# Patient Record
Sex: Male | Born: 1935 | Race: White | Hispanic: No | Marital: Married | Smoking: Former smoker
Health system: Southern US, Community
[De-identification: ages and names within clinical notes are randomized; demographics above are authoritative.]

## PROBLEM LIST (undated history)

## (undated) DIAGNOSIS — K219 Gastro-esophageal reflux disease without esophagitis: Secondary | ICD-10-CM

## (undated) DIAGNOSIS — M4712 Other spondylosis with myelopathy, cervical region: Secondary | ICD-10-CM

## (undated) DIAGNOSIS — I6529 Occlusion and stenosis of unspecified carotid artery: Secondary | ICD-10-CM

## (undated) DIAGNOSIS — C61 Malignant neoplasm of prostate: Secondary | ICD-10-CM

## (undated) DIAGNOSIS — J209 Acute bronchitis, unspecified: Secondary | ICD-10-CM

## (undated) DIAGNOSIS — J189 Pneumonia, unspecified organism: Secondary | ICD-10-CM

## (undated) DIAGNOSIS — D649 Anemia, unspecified: Secondary | ICD-10-CM

## (undated) DIAGNOSIS — Z9889 Other specified postprocedural states: Secondary | ICD-10-CM

## (undated) DIAGNOSIS — Z9289 Personal history of other medical treatment: Secondary | ICD-10-CM

## (undated) DIAGNOSIS — S72001A Fracture of unspecified part of neck of right femur, initial encounter for closed fracture: Secondary | ICD-10-CM

## (undated) DIAGNOSIS — G894 Chronic pain syndrome: Secondary | ICD-10-CM

## (undated) DIAGNOSIS — K222 Esophageal obstruction: Secondary | ICD-10-CM

## (undated) DIAGNOSIS — E785 Hyperlipidemia, unspecified: Secondary | ICD-10-CM

## (undated) DIAGNOSIS — I10 Essential (primary) hypertension: Secondary | ICD-10-CM

## (undated) DIAGNOSIS — I639 Cerebral infarction, unspecified: Secondary | ICD-10-CM

## (undated) DIAGNOSIS — K5790 Diverticulosis of intestine, part unspecified, without perforation or abscess without bleeding: Secondary | ICD-10-CM

## (undated) DIAGNOSIS — R1013 Epigastric pain: Secondary | ICD-10-CM

## (undated) HISTORY — DX: Anemia, unspecified: D64.9

## (undated) HISTORY — DX: Other spondylosis with myelopathy, cervical region: M47.12

## (undated) HISTORY — DX: Pneumonia, unspecified organism: J18.9

## (undated) HISTORY — PX: NASAL SINUS SURGERY: SHX719

## (undated) HISTORY — DX: Gastro-esophageal reflux disease without esophagitis: K21.9

## (undated) HISTORY — DX: Essential (primary) hypertension: I10

## (undated) HISTORY — DX: Chronic pain syndrome: G89.4

## (undated) HISTORY — DX: Hyperlipidemia, unspecified: E78.5

## (undated) HISTORY — DX: Cerebral infarction, unspecified: I63.9

## (undated) HISTORY — DX: Epigastric pain: R10.13

## (undated) HISTORY — DX: Malignant neoplasm of prostate: C61

## (undated) HISTORY — DX: Personal history of other medical treatment: Z92.89

## (undated) HISTORY — DX: Occlusion and stenosis of unspecified carotid artery: I65.29

## (undated) HISTORY — PX: LUMBAR DISC SURGERY: SHX700

## (undated) HISTORY — PX: CATARACT EXTRACTION: SUR2

## (undated) HISTORY — DX: Esophageal obstruction: K22.2

## (undated) HISTORY — DX: Acute bronchitis, unspecified: J20.9

## (undated) HISTORY — DX: Diverticulosis of intestine, part unspecified, without perforation or abscess without bleeding: K57.90

---
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
## 1987-12-03 ENCOUNTER — Encounter: Payer: Self-pay | Admitting: Internal Medicine

## 1988-01-23 ENCOUNTER — Encounter (INDEPENDENT_AMBULATORY_CARE_PROVIDER_SITE_OTHER): Payer: Self-pay | Admitting: *Deleted

## 1998-01-12 DIAGNOSIS — C61 Malignant neoplasm of prostate: Secondary | ICD-10-CM

## 1998-01-12 HISTORY — PX: INSERTION PROSTATE RADIATION SEED: SUR718

## 1998-01-12 HISTORY — DX: Malignant neoplasm of prostate: C61

## 1998-02-12 ENCOUNTER — Other Ambulatory Visit: Admission: RE | Admit: 1998-02-12 | Discharge: 1998-02-12 | Payer: Self-pay | Admitting: Oral Surgery

## 1999-02-12 ENCOUNTER — Encounter: Admission: RE | Admit: 1999-02-12 | Discharge: 1999-02-12 | Payer: Self-pay | Admitting: Family Medicine

## 1999-02-12 ENCOUNTER — Encounter: Payer: Self-pay | Admitting: Family Medicine

## 1999-04-17 ENCOUNTER — Ambulatory Visit (HOSPITAL_COMMUNITY): Admission: RE | Admit: 1999-04-17 | Discharge: 1999-04-17 | Payer: Self-pay | Admitting: Internal Medicine

## 1999-04-17 ENCOUNTER — Encounter: Payer: Self-pay | Admitting: Internal Medicine

## 1999-08-13 DIAGNOSIS — I639 Cerebral infarction, unspecified: Secondary | ICD-10-CM

## 1999-08-13 HISTORY — DX: Cerebral infarction, unspecified: I63.9

## 1999-08-22 ENCOUNTER — Encounter: Payer: Self-pay | Admitting: Neurology

## 1999-08-22 ENCOUNTER — Inpatient Hospital Stay (HOSPITAL_COMMUNITY): Admission: EM | Admit: 1999-08-22 | Discharge: 1999-08-26 | Payer: Self-pay | Admitting: Emergency Medicine

## 1999-08-23 ENCOUNTER — Encounter: Payer: Self-pay | Admitting: Neurology

## 1999-08-26 ENCOUNTER — Inpatient Hospital Stay (HOSPITAL_COMMUNITY)
Admission: RE | Admit: 1999-08-26 | Discharge: 1999-09-02 | Payer: Self-pay | Admitting: Physical Medicine & Rehabilitation

## 1999-09-04 ENCOUNTER — Encounter
Admission: RE | Admit: 1999-09-04 | Discharge: 1999-10-10 | Payer: Self-pay | Admitting: Physical Medicine & Rehabilitation

## 1999-09-05 ENCOUNTER — Emergency Department (HOSPITAL_COMMUNITY): Admission: EM | Admit: 1999-09-05 | Discharge: 1999-09-05 | Payer: Self-pay | Admitting: Emergency Medicine

## 1999-09-05 ENCOUNTER — Encounter: Payer: Self-pay | Admitting: Emergency Medicine

## 2000-01-13 HISTORY — PX: CAROTID ENDARTERECTOMY: SUR193

## 2000-06-15 ENCOUNTER — Emergency Department (HOSPITAL_COMMUNITY): Admission: EM | Admit: 2000-06-15 | Discharge: 2000-06-15 | Payer: Self-pay | Admitting: Emergency Medicine

## 2001-08-22 ENCOUNTER — Encounter: Payer: Self-pay | Admitting: Internal Medicine

## 2001-09-05 ENCOUNTER — Ambulatory Visit (HOSPITAL_COMMUNITY): Admission: RE | Admit: 2001-09-05 | Discharge: 2001-09-05 | Payer: Self-pay | Admitting: Internal Medicine

## 2001-09-05 ENCOUNTER — Encounter: Payer: Self-pay | Admitting: Internal Medicine

## 2001-10-28 ENCOUNTER — Encounter: Payer: Self-pay | Admitting: Emergency Medicine

## 2001-10-28 ENCOUNTER — Emergency Department (HOSPITAL_COMMUNITY): Admission: EM | Admit: 2001-10-28 | Discharge: 2001-10-28 | Payer: Self-pay | Admitting: Emergency Medicine

## 2003-06-06 ENCOUNTER — Ambulatory Visit (HOSPITAL_COMMUNITY): Admission: RE | Admit: 2003-06-06 | Discharge: 2003-06-06 | Payer: Self-pay | Admitting: Neurosurgery

## 2003-09-04 ENCOUNTER — Inpatient Hospital Stay (HOSPITAL_COMMUNITY): Admission: RE | Admit: 2003-09-04 | Discharge: 2003-09-09 | Payer: Self-pay | Admitting: Neurosurgery

## 2004-04-10 ENCOUNTER — Ambulatory Visit: Payer: Self-pay | Admitting: Family Medicine

## 2004-09-04 ENCOUNTER — Encounter: Admission: RE | Admit: 2004-09-04 | Discharge: 2004-09-04 | Payer: Self-pay | Admitting: Neurosurgery

## 2004-09-05 ENCOUNTER — Encounter: Admission: RE | Admit: 2004-09-05 | Discharge: 2004-09-05 | Payer: Self-pay | Admitting: Neurosurgery

## 2005-03-04 ENCOUNTER — Ambulatory Visit: Payer: Self-pay | Admitting: Family Medicine

## 2005-05-07 ENCOUNTER — Ambulatory Visit: Payer: Self-pay | Admitting: Family Medicine

## 2005-07-23 ENCOUNTER — Ambulatory Visit: Payer: Self-pay | Admitting: Family Medicine

## 2005-08-12 HISTORY — PX: CERVICAL DISC SURGERY: SHX588

## 2005-11-30 ENCOUNTER — Emergency Department (HOSPITAL_COMMUNITY): Admission: EM | Admit: 2005-11-30 | Discharge: 2005-11-30 | Payer: Self-pay | Admitting: Emergency Medicine

## 2006-05-13 ENCOUNTER — Ambulatory Visit: Payer: Self-pay | Admitting: Family Medicine

## 2006-05-13 ENCOUNTER — Encounter: Admission: RE | Admit: 2006-05-13 | Discharge: 2006-05-13 | Payer: Self-pay | Admitting: Family Medicine

## 2006-05-13 LAB — CONVERTED CEMR LAB
AST: 17 units/L (ref 0–37)
Albumin: 4 g/dL (ref 3.5–5.2)
Basophils Relative: 0.4 % (ref 0.0–1.0)
Bilirubin, Direct: 0.1 mg/dL (ref 0.0–0.3)
Chloride: 111 meq/L (ref 96–112)
Creatinine, Ser: 0.8 mg/dL (ref 0.4–1.5)
Eosinophils Relative: 2.2 % (ref 0.0–5.0)
Glucose, Bld: 95 mg/dL (ref 70–99)
HCT: 42.3 % (ref 39.0–52.0)
LDL Cholesterol: 104 mg/dL — ABNORMAL HIGH (ref 0–99)
Neutrophils Relative %: 73.2 % (ref 43.0–77.0)
PSA: 8.08 ng/mL — ABNORMAL HIGH (ref 0.10–4.00)
RBC: 4.57 M/uL (ref 4.22–5.81)
RDW: 12.5 % (ref 11.5–14.6)
Sodium: 143 meq/L (ref 135–145)
Total Bilirubin: 0.6 mg/dL (ref 0.3–1.2)
Total CHOL/HDL Ratio: 4.4
WBC: 6.6 10*3/uL (ref 4.5–10.5)

## 2006-05-20 ENCOUNTER — Emergency Department (HOSPITAL_COMMUNITY): Admission: EM | Admit: 2006-05-20 | Discharge: 2006-05-20 | Payer: Self-pay | Admitting: Family Medicine

## 2006-06-29 ENCOUNTER — Ambulatory Visit: Admission: RE | Admit: 2006-06-29 | Discharge: 2006-09-27 | Payer: Self-pay | Admitting: Radiation Oncology

## 2006-09-02 ENCOUNTER — Encounter: Payer: Self-pay | Admitting: Family Medicine

## 2006-09-21 ENCOUNTER — Encounter: Admission: RE | Admit: 2006-09-21 | Discharge: 2006-09-21 | Payer: Self-pay | Admitting: Urology

## 2006-09-22 ENCOUNTER — Ambulatory Visit (HOSPITAL_BASED_OUTPATIENT_CLINIC_OR_DEPARTMENT_OTHER): Admission: RE | Admit: 2006-09-22 | Discharge: 2006-09-22 | Payer: Self-pay | Admitting: Urology

## 2006-09-22 ENCOUNTER — Encounter: Payer: Self-pay | Admitting: Family Medicine

## 2006-10-08 ENCOUNTER — Ambulatory Visit: Admission: RE | Admit: 2006-10-08 | Discharge: 2006-11-29 | Payer: Self-pay | Admitting: Radiation Oncology

## 2006-11-05 ENCOUNTER — Ambulatory Visit: Payer: Self-pay | Admitting: Family Medicine

## 2006-11-05 DIAGNOSIS — Z8546 Personal history of malignant neoplasm of prostate: Secondary | ICD-10-CM | POA: Insufficient documentation

## 2006-11-05 DIAGNOSIS — G44209 Tension-type headache, unspecified, not intractable: Secondary | ICD-10-CM | POA: Insufficient documentation

## 2007-01-04 ENCOUNTER — Ambulatory Visit: Payer: Self-pay | Admitting: Family Medicine

## 2007-01-04 DIAGNOSIS — I10 Essential (primary) hypertension: Secondary | ICD-10-CM | POA: Insufficient documentation

## 2007-01-04 DIAGNOSIS — J309 Allergic rhinitis, unspecified: Secondary | ICD-10-CM | POA: Insufficient documentation

## 2007-02-18 ENCOUNTER — Telehealth: Payer: Self-pay | Admitting: Family Medicine

## 2007-05-17 ENCOUNTER — Telehealth: Payer: Self-pay | Admitting: Family Medicine

## 2007-06-09 ENCOUNTER — Encounter: Payer: Self-pay | Admitting: Family Medicine

## 2007-07-04 ENCOUNTER — Encounter: Payer: Self-pay | Admitting: Family Medicine

## 2007-08-09 ENCOUNTER — Ambulatory Visit: Payer: Self-pay | Admitting: Family Medicine

## 2007-08-09 DIAGNOSIS — F411 Generalized anxiety disorder: Secondary | ICD-10-CM | POA: Insufficient documentation

## 2007-12-29 ENCOUNTER — Ambulatory Visit: Payer: Self-pay | Admitting: Family Medicine

## 2007-12-29 DIAGNOSIS — M719 Bursopathy, unspecified: Secondary | ICD-10-CM

## 2007-12-29 DIAGNOSIS — M67919 Unspecified disorder of synovium and tendon, unspecified shoulder: Secondary | ICD-10-CM | POA: Insufficient documentation

## 2008-04-30 ENCOUNTER — Telehealth: Payer: Self-pay | Admitting: Family Medicine

## 2008-06-22 ENCOUNTER — Encounter: Payer: Self-pay | Admitting: Family Medicine

## 2008-07-26 ENCOUNTER — Telehealth: Payer: Self-pay | Admitting: Family Medicine

## 2008-09-03 ENCOUNTER — Telehealth: Payer: Self-pay | Admitting: Family Medicine

## 2008-09-12 ENCOUNTER — Ambulatory Visit (HOSPITAL_BASED_OUTPATIENT_CLINIC_OR_DEPARTMENT_OTHER): Admission: RE | Admit: 2008-09-12 | Discharge: 2008-09-12 | Payer: Self-pay | Admitting: Urology

## 2008-09-12 ENCOUNTER — Encounter (INDEPENDENT_AMBULATORY_CARE_PROVIDER_SITE_OTHER): Payer: Self-pay | Admitting: *Deleted

## 2008-10-11 ENCOUNTER — Ambulatory Visit: Payer: Self-pay | Admitting: Family Medicine

## 2008-12-11 ENCOUNTER — Ambulatory Visit: Payer: Self-pay | Admitting: Family Medicine

## 2008-12-11 DIAGNOSIS — I6350 Cerebral infarction due to unspecified occlusion or stenosis of unspecified cerebral artery: Secondary | ICD-10-CM | POA: Insufficient documentation

## 2008-12-11 DIAGNOSIS — R32 Unspecified urinary incontinence: Secondary | ICD-10-CM | POA: Insufficient documentation

## 2008-12-11 DIAGNOSIS — R350 Frequency of micturition: Secondary | ICD-10-CM | POA: Insufficient documentation

## 2008-12-11 DIAGNOSIS — D649 Anemia, unspecified: Secondary | ICD-10-CM | POA: Insufficient documentation

## 2008-12-11 DIAGNOSIS — E785 Hyperlipidemia, unspecified: Secondary | ICD-10-CM | POA: Insufficient documentation

## 2008-12-11 DIAGNOSIS — E559 Vitamin D deficiency, unspecified: Secondary | ICD-10-CM | POA: Insufficient documentation

## 2008-12-11 LAB — CONVERTED CEMR LAB
Ketones, urine, test strip: NEGATIVE
Nitrite: NEGATIVE
Urobilinogen, UA: 0.2

## 2008-12-12 LAB — CONVERTED CEMR LAB
ALT: 12 units/L (ref 0–53)
AST: 20 units/L (ref 0–37)
Alkaline Phosphatase: 53 units/L (ref 39–117)
BUN: 15 mg/dL (ref 6–23)
Basophils Absolute: 0 10*3/uL (ref 0.0–0.1)
Bilirubin, Direct: 0.1 mg/dL (ref 0.0–0.3)
Calcium: 9.3 mg/dL (ref 8.4–10.5)
Cholesterol: 169 mg/dL (ref 0–200)
Creatinine, Ser: 0.8 mg/dL (ref 0.4–1.5)
Eosinophils Relative: 2.4 % (ref 0.0–5.0)
GFR calc non Af Amer: 99.55 mL/min (ref >60)
HCT: 40.8 % (ref 39.0–52.0)
HDL: 34.2 mg/dL — ABNORMAL LOW (ref >39.00)
LDL Cholesterol: 108 mg/dL — ABNORMAL HIGH (ref 0–99)
Lymphocytes Relative: 13.2 % (ref 12.0–46.0)
Monocytes Relative: 5.4 % (ref 3.0–12.0)
Neutrophils Relative %: 78.6 % — ABNORMAL HIGH (ref 43.0–77.0)
Platelets: 257 10*3/uL (ref 150.0–400.0)
Potassium: 5.1 meq/L (ref 3.5–5.1)
Total Bilirubin: 0.7 mg/dL (ref 0.3–1.2)
VLDL: 26.6 mg/dL (ref 0.0–40.0)
WBC: 7.2 10*3/uL (ref 4.5–10.5)

## 2008-12-18 ENCOUNTER — Telehealth: Payer: Self-pay | Admitting: Family Medicine

## 2008-12-19 ENCOUNTER — Telehealth: Payer: Self-pay | Admitting: Family Medicine

## 2009-01-23 ENCOUNTER — Telehealth: Payer: Self-pay | Admitting: Family Medicine

## 2009-01-30 ENCOUNTER — Telehealth: Payer: Self-pay | Admitting: Family Medicine

## 2009-04-08 ENCOUNTER — Telehealth: Payer: Self-pay | Admitting: Family Medicine

## 2009-04-16 ENCOUNTER — Encounter: Payer: Self-pay | Admitting: Family Medicine

## 2009-04-18 ENCOUNTER — Telehealth: Payer: Self-pay | Admitting: Internal Medicine

## 2009-04-19 ENCOUNTER — Ambulatory Visit: Payer: Self-pay | Admitting: Internal Medicine

## 2009-04-19 LAB — CONVERTED CEMR LAB
ALT: 9 units/L (ref 0–53)
BUN: 23 mg/dL (ref 6–23)
Basophils Absolute: 0 10*3/uL (ref 0.0–0.1)
Basophils Relative: 0.5 % (ref 0.0–3.0)
CO2: 27 meq/L (ref 19–32)
Calcium: 8.6 mg/dL (ref 8.4–10.5)
Chloride: 107 meq/L (ref 96–112)
Creatinine, Ser: 1.1 mg/dL (ref 0.4–1.5)
Eosinophils Absolute: 0.2 10*3/uL (ref 0.0–0.7)
GFR calc non Af Amer: 68.87 mL/min (ref >60)
HCT: 34.1 % — ABNORMAL LOW (ref 39.0–52.0)
Hemoglobin: 11.9 g/dL — ABNORMAL LOW (ref 13.0–17.0)
Lymphs Abs: 1.1 10*3/uL (ref 0.7–4.0)
MCHC: 34.9 g/dL (ref 30.0–36.0)
Monocytes Relative: 8 % (ref 3.0–12.0)
Neutro Abs: 5.4 10*3/uL (ref 1.4–7.7)
RBC: 3.77 M/uL — ABNORMAL LOW (ref 4.22–5.81)
RDW: 13.8 % (ref 11.5–14.6)
Total Bilirubin: 0.2 mg/dL — ABNORMAL LOW (ref 0.3–1.2)

## 2009-04-22 ENCOUNTER — Telehealth: Payer: Self-pay | Admitting: Internal Medicine

## 2009-04-26 ENCOUNTER — Telehealth: Payer: Self-pay | Admitting: Internal Medicine

## 2009-04-30 ENCOUNTER — Ambulatory Visit: Payer: Self-pay | Admitting: Internal Medicine

## 2009-05-01 ENCOUNTER — Encounter (INDEPENDENT_AMBULATORY_CARE_PROVIDER_SITE_OTHER): Payer: Self-pay | Admitting: *Deleted

## 2009-05-04 ENCOUNTER — Encounter: Payer: Self-pay | Admitting: Internal Medicine

## 2009-05-06 ENCOUNTER — Encounter: Payer: Self-pay | Admitting: Internal Medicine

## 2009-05-27 ENCOUNTER — Telehealth: Payer: Self-pay | Admitting: Internal Medicine

## 2009-07-03 ENCOUNTER — Telehealth: Payer: Self-pay | Admitting: Family Medicine

## 2009-07-05 ENCOUNTER — Telehealth: Payer: Self-pay | Admitting: Internal Medicine

## 2009-09-13 ENCOUNTER — Telehealth: Payer: Self-pay | Admitting: Internal Medicine

## 2009-10-04 ENCOUNTER — Ambulatory Visit: Payer: Self-pay | Admitting: Internal Medicine

## 2009-10-08 ENCOUNTER — Ambulatory Visit: Payer: Self-pay | Admitting: Internal Medicine

## 2009-10-14 ENCOUNTER — Encounter: Payer: Self-pay | Admitting: Internal Medicine

## 2009-10-14 LAB — CONVERTED CEMR LAB
BUN: 32 mg/dL — ABNORMAL HIGH
CO2: 25 meq/L
Calcium: 8.9 mg/dL
Chloride: 109 meq/L
Creatinine, Ser: 1.3 mg/dL
GFR calc non Af Amer: 57.75 mL/min
Glucose, Bld: 88 mg/dL
Potassium: 5.6 meq/L — ABNORMAL HIGH
Sodium: 139 meq/L

## 2009-10-15 ENCOUNTER — Ambulatory Visit: Payer: Self-pay | Admitting: Cardiovascular Disease

## 2009-10-15 ENCOUNTER — Encounter: Payer: Self-pay | Admitting: Internal Medicine

## 2009-10-15 ENCOUNTER — Ambulatory Visit: Payer: Self-pay

## 2009-10-15 ENCOUNTER — Ambulatory Visit (HOSPITAL_COMMUNITY)
Admission: RE | Admit: 2009-10-15 | Discharge: 2009-10-15 | Payer: Self-pay | Source: Home / Self Care | Admitting: Internal Medicine

## 2009-10-15 ENCOUNTER — Telehealth (INDEPENDENT_AMBULATORY_CARE_PROVIDER_SITE_OTHER): Payer: Self-pay | Admitting: *Deleted

## 2009-10-15 ENCOUNTER — Encounter (INDEPENDENT_AMBULATORY_CARE_PROVIDER_SITE_OTHER): Payer: Self-pay | Admitting: *Deleted

## 2009-10-18 ENCOUNTER — Telehealth: Payer: Self-pay | Admitting: Internal Medicine

## 2009-10-18 ENCOUNTER — Ambulatory Visit: Payer: Self-pay | Admitting: Internal Medicine

## 2009-10-21 LAB — CONVERTED CEMR LAB
BUN: 32 mg/dL — ABNORMAL HIGH (ref 6–23)
CO2: 25 meq/L (ref 19–32)
Calcium: 9.1 mg/dL (ref 8.4–10.5)
Chloride: 108 meq/L (ref 96–112)
Creatinine, Ser: 1.1 mg/dL (ref 0.4–1.5)
Glucose, Bld: 101 mg/dL — ABNORMAL HIGH (ref 70–99)

## 2009-10-25 ENCOUNTER — Encounter: Payer: Self-pay | Admitting: Internal Medicine

## 2009-10-29 ENCOUNTER — Ambulatory Visit: Payer: Self-pay | Admitting: Internal Medicine

## 2009-10-30 ENCOUNTER — Telehealth (INDEPENDENT_AMBULATORY_CARE_PROVIDER_SITE_OTHER): Payer: Self-pay | Admitting: *Deleted

## 2009-10-30 LAB — CONVERTED CEMR LAB
BUN: 26 mg/dL — ABNORMAL HIGH (ref 6–23)
Calcium: 8.9 mg/dL (ref 8.4–10.5)
Creatinine, Ser: 1 mg/dL (ref 0.4–1.5)
GFR calc non Af Amer: 75.04 mL/min (ref >60)
Glucose, Bld: 88 mg/dL (ref 70–99)
Potassium: 5.5 meq/L — ABNORMAL HIGH (ref 3.5–5.1)

## 2009-11-07 ENCOUNTER — Encounter: Payer: Self-pay | Admitting: Internal Medicine

## 2009-11-08 ENCOUNTER — Ambulatory Visit: Payer: Self-pay | Admitting: Internal Medicine

## 2009-11-11 ENCOUNTER — Telehealth (INDEPENDENT_AMBULATORY_CARE_PROVIDER_SITE_OTHER): Payer: Self-pay | Admitting: *Deleted

## 2009-11-12 ENCOUNTER — Telehealth: Payer: Self-pay | Admitting: Internal Medicine

## 2009-11-14 ENCOUNTER — Telehealth: Payer: Self-pay | Admitting: Internal Medicine

## 2009-11-14 ENCOUNTER — Encounter: Payer: Self-pay | Admitting: Internal Medicine

## 2009-11-14 LAB — CONVERTED CEMR LAB
CO2: 26 meq/L (ref 19–32)
Calcium: 8.9 mg/dL (ref 8.4–10.5)
Creatinine, Ser: 1 mg/dL (ref 0.4–1.5)
GFR calc non Af Amer: 75.89 mL/min (ref >60)
Sodium: 140 meq/L (ref 135–145)

## 2009-11-15 ENCOUNTER — Encounter: Payer: Self-pay | Admitting: Internal Medicine

## 2009-11-15 ENCOUNTER — Ambulatory Visit: Payer: Self-pay

## 2009-11-18 ENCOUNTER — Telehealth: Payer: Self-pay | Admitting: Internal Medicine

## 2009-11-25 ENCOUNTER — Ambulatory Visit: Payer: Self-pay | Admitting: Internal Medicine

## 2009-11-25 DIAGNOSIS — R0989 Other specified symptoms and signs involving the circulatory and respiratory systems: Secondary | ICD-10-CM | POA: Insufficient documentation

## 2009-11-25 LAB — CONVERTED CEMR LAB
BUN: 31 mg/dL — ABNORMAL HIGH (ref 6–23)
Chloride: 108 meq/L (ref 96–112)
GFR calc non Af Amer: 62.8 mL/min (ref >60)
Potassium: 5.8 meq/L — ABNORMAL HIGH (ref 3.5–5.1)

## 2009-11-26 ENCOUNTER — Telehealth: Payer: Self-pay | Admitting: Internal Medicine

## 2009-11-27 ENCOUNTER — Telehealth: Payer: Self-pay | Admitting: Internal Medicine

## 2009-12-09 ENCOUNTER — Telehealth: Payer: Self-pay | Admitting: Internal Medicine

## 2009-12-17 ENCOUNTER — Telehealth: Payer: Self-pay | Admitting: Internal Medicine

## 2009-12-23 ENCOUNTER — Telehealth: Payer: Self-pay | Admitting: Internal Medicine

## 2009-12-25 ENCOUNTER — Ambulatory Visit: Payer: Self-pay | Admitting: Internal Medicine

## 2009-12-25 ENCOUNTER — Telehealth (INDEPENDENT_AMBULATORY_CARE_PROVIDER_SITE_OTHER): Payer: Self-pay | Admitting: *Deleted

## 2009-12-25 ENCOUNTER — Ambulatory Visit: Payer: Self-pay

## 2009-12-25 ENCOUNTER — Encounter: Payer: Self-pay | Admitting: Internal Medicine

## 2009-12-26 ENCOUNTER — Encounter: Payer: Self-pay | Admitting: Internal Medicine

## 2009-12-26 ENCOUNTER — Ambulatory Visit: Payer: Self-pay | Admitting: Internal Medicine

## 2009-12-26 ENCOUNTER — Telehealth: Payer: Self-pay | Admitting: Internal Medicine

## 2009-12-27 ENCOUNTER — Telehealth: Payer: Self-pay | Admitting: Internal Medicine

## 2009-12-31 ENCOUNTER — Ambulatory Visit: Payer: Self-pay | Admitting: Vascular Surgery

## 2009-12-31 ENCOUNTER — Encounter: Payer: Self-pay | Admitting: Internal Medicine

## 2010-01-02 ENCOUNTER — Telehealth: Payer: Self-pay | Admitting: Internal Medicine

## 2010-01-02 LAB — CONVERTED CEMR LAB
CO2: 25 meq/L (ref 19–32)
Chloride: 107 meq/L (ref 96–112)
Glucose, Bld: 146 mg/dL — ABNORMAL HIGH (ref 70–99)
Sodium: 138 meq/L (ref 135–145)

## 2010-01-03 ENCOUNTER — Telehealth: Payer: Self-pay | Admitting: Family Medicine

## 2010-01-16 ENCOUNTER — Ambulatory Visit (HOSPITAL_COMMUNITY)
Admission: RE | Admit: 2010-01-16 | Discharge: 2010-01-16 | Payer: Self-pay | Source: Home / Self Care | Attending: Vascular Surgery | Admitting: Vascular Surgery

## 2010-01-16 LAB — POCT I-STAT, CHEM 8
BUN: 24 mg/dL — ABNORMAL HIGH (ref 6–23)
Calcium, Ion: 1.13 mmol/L (ref 1.12–1.32)
Chloride: 111 mEq/L (ref 96–112)
Creatinine, Ser: 1 mg/dL (ref 0.4–1.5)
Glucose, Bld: 129 mg/dL — ABNORMAL HIGH (ref 70–99)
HCT: 35 % — ABNORMAL LOW (ref 39.0–52.0)
Hemoglobin: 11.9 g/dL — ABNORMAL LOW (ref 13.0–17.0)
Potassium: 4.7 mEq/L (ref 3.5–5.1)
Sodium: 140 mEq/L (ref 135–145)
TCO2: 24 mmol/L (ref 0–100)

## 2010-01-28 ENCOUNTER — Ambulatory Visit
Admission: RE | Admit: 2010-01-28 | Discharge: 2010-01-28 | Payer: Self-pay | Source: Home / Self Care | Attending: Vascular Surgery | Admitting: Vascular Surgery

## 2010-02-06 LAB — CBC
HCT: 33.8 % — ABNORMAL LOW (ref 39.0–52.0)
Hemoglobin: 11.6 g/dL — ABNORMAL LOW (ref 13.0–17.0)
MCH: 28.7 pg (ref 26.0–34.0)
MCHC: 34.3 g/dL (ref 30.0–36.0)
MCV: 83.7 fL (ref 78.0–100.0)
Platelets: 271 10*3/uL (ref 150–400)
RBC: 4.04 MIL/uL — ABNORMAL LOW (ref 4.22–5.81)
RDW: 14 % (ref 11.5–15.5)
WBC: 7.5 10*3/uL (ref 4.0–10.5)

## 2010-02-06 LAB — TYPE AND SCREEN
ABO/RH(D): O POS
Antibody Screen: NEGATIVE

## 2010-02-06 LAB — URINALYSIS, ROUTINE W REFLEX MICROSCOPIC
Bilirubin Urine: NEGATIVE
Ketones, ur: NEGATIVE mg/dL
Nitrite: NEGATIVE
Protein, ur: NEGATIVE mg/dL
Specific Gravity, Urine: 1.007 (ref 1.005–1.030)
Urine Glucose, Fasting: NEGATIVE mg/dL
Urobilinogen, UA: 0.2 mg/dL (ref 0.0–1.0)
pH: 6 (ref 5.0–8.0)

## 2010-02-06 LAB — COMPREHENSIVE METABOLIC PANEL
ALT: 13 U/L (ref 0–53)
AST: 19 U/L (ref 0–37)
CO2: 26 mEq/L (ref 19–32)
Chloride: 108 mEq/L (ref 96–112)
Creatinine, Ser: 0.94 mg/dL (ref 0.4–1.5)
GFR calc Af Amer: >60 mL/min (ref >60)
GFR calc non Af Amer: >60 mL/min (ref >60)
Glucose, Bld: 101 mg/dL — ABNORMAL HIGH (ref 70–99)
Total Bilirubin: 0.2 mg/dL — ABNORMAL LOW (ref 0.3–1.2)

## 2010-02-06 LAB — APTT: aPTT: 36 seconds (ref 24–37)

## 2010-02-06 LAB — PROTIME-INR
INR: 0.97 (ref 0.00–1.49)
Prothrombin Time: 13.1 seconds (ref 11.6–15.2)

## 2010-02-06 LAB — ABO/RH: ABO/RH(D): O POS

## 2010-02-09 LAB — CONVERTED CEMR LAB
CO2: 24 meq/L (ref 19–32)
Chloride: 106 meq/L (ref 96–112)
Creatinine, Ser: 1.3 mg/dL (ref 0.4–1.5)
Potassium: 5.7 meq/L — ABNORMAL HIGH (ref 3.5–5.1)

## 2010-02-10 ENCOUNTER — Inpatient Hospital Stay (HOSPITAL_COMMUNITY)
Admission: RE | Admit: 2010-02-10 | Discharge: 2010-02-11 | Disposition: A | Discharge status: Home / Self Care | Payer: MEDICARE | Source: Home / Self Care | Attending: Vascular Surgery | Admitting: Vascular Surgery

## 2010-02-11 LAB — CBC
HCT: 30 % — ABNORMAL LOW (ref 39.0–52.0)
Platelets: 264 10*3/uL (ref 150–400)
RBC: 3.51 MIL/uL — ABNORMAL LOW (ref 4.22–5.81)
RDW: 14.4 % (ref 11.5–15.5)
WBC: 15 10*3/uL — ABNORMAL HIGH (ref 4.0–10.5)

## 2010-02-11 LAB — BASIC METABOLIC PANEL
Chloride: 107 mEq/L (ref 96–112)
GFR calc non Af Amer: >60 mL/min (ref >60)
Potassium: 4.4 mEq/L (ref 3.5–5.1)
Sodium: 138 mEq/L (ref 135–145)

## 2010-02-13 NOTE — Progress Notes (Signed)
Summary: TRIAGE-labwork  Phone Note Call from Patient Call back at [phone]   Caller: wife, Randa Evens Call For: Dr. Juanda Chance Reason for Call: Talk to Nurse Summary of Call: wife would like to know if pt needs more labwork Initial call taken by: Vallarie Mare,  July 05, 2009 2:51 PM  Follow-up for Phone Call        Last OV/labs 04-19-09. DR.BRODIE PLEASE ADVISE  Follow-up by: Laureen Ochs LPN,  July 08, 2009 8:24 AM  Additional Follow-up for Phone Call Additional follow up Details #1::        No lab work, but he canceled his follow up appointment. I suggest he follows up with me  in next 2 months. Additional Follow-up by: Hart Carwin MD,  July 08, 2009 5:56 PM    Additional Follow-up for Phone Call Additional follow up Details #2::    Above MD orders reviewed with patient's wife, she will callback to schedule pt. appt. Pt. instructed to call back as needed.  Follow-up by: Laureen Ochs LPN,  July 09, 2009 8:00 AM

## 2010-02-13 NOTE — Assessment & Plan Note (Signed)
Summary: np6/htn/jml   Visit Type:  Initial Consult Referring Julliette Frentz:  n/a Primary Sohil Timko:  Rickard Patience, MD   CC:  New evaluation per Dr. Lina Sar- HTN.  History of Present Illness: Patient is a 77 year old with a hisotyr of hypertension, CVA. He was seen by D. Brodie.  Sent her for BP recommendations Per the patients' wife his BP is up/down  He denies chest pain, no SOB..  Is fairly active.  Did have 1 spell of dizziness/staggery.  BP at time was 192.  New Orders:     1)  EKG w/ Interpretation (93000)     2)  TLB-BMP (Basic Metabolic Panel-BMET) (80048-METABOL)     3)  Echocardiogram (Echo)   Current Medications (verified): 1)  Aspirin Ec 325 Mg Tbec (Aspirin) .... Take 1 Tablet Once A Day 2)  Neurontin 300 Mg Caps (Gabapentin) .... Take 1 Capsule By Mouth Three Times A Day 3)  Norvasc 10 Mg Tabs (Amlodipine Besylate) .... Take 1 Tablet Once A Day 4)  Plavix 75 Mg Tabs (Clopidogrel Bisulfate) .[name] Kitchen.. 1 Qd 5)  Nabumetone 750 Mg Tabs (Nabumetone) .[name] Kitchen.. 1 Two Times A Day Pc For Arthritis 6)  Diovan 160 Mg Tabs (Valsartan) .[name] Kitchen.. 1 By Mouth Once Daily 7)  Prilosec 40 Mg Cpdr (Omeprazole) .... One Tablet By Mouth Two Times A Day As Needed 8)  Clonidine Hcl 0.2 Mg Tabs (Clonidine Hcl) .[name] Kitchen.. 1 By Mouth Two Times A Day 9)  Stool Softener Laxative 8.6-50 Mg Tabs (Sennosides-Docusate Sodium) .... As Needed 10)  Miralax  Powd (Polyethylene Glycol 3350) .... Mix 9 Grams Into 8 Oz of Water Once Daily  Allergies: 1)  ! Percocet 2)  ! Codeine  Past History:  Past medical, surgical, family and social histories (including risk factors) reviewed, and no changes noted (except as noted below).  Past Medical History: Reviewed history from 10/03/2009 and no changes required. Hypertension Stroke, right thalamic 8-01 per Dr. Anne Hahn Prostate cancer, hx of, per Dr. Vonita Moss Benign distal esophageal stricture Gastritis Grade II esophagitis Diverticulosis Hemorrhoids GERD Allergic  rhinitis  Past Surgical History: Reviewed history from 10/03/2009 and no changes required. cervical spine surgery 8-07 per Dr. Channing Mutters lumbar spine surgery Appendectomy Cataract extraction radioactive seeds in prostate  Family History: Reviewed history from 10/03/2009 and no changes required.  Mother died of, "old age".  Father died with black lung disease.  Five to six brothers and sisters - one brother died with MI and onewith cancer.  Social History: Reviewed history from 10/03/2009 and no changes required. Married Retired Patient is a former smoker.  Alcohol Use - no Smokeless Tobacco: Chewing Tobacco   Review of Systems       ALll  systems reviewed.  Neg to above problem.  Vital Signs:  Patient profile:   77 year old male Height:      68 inches Weight:      172 pounds BMI:     26.25 Pulse rate:   45 / minute Pulse rhythm:   irregular Resp:     18 per minute BP sitting:   170 / 60  (right arm) Cuff size:   large  Vitals Entered By: Vikki Ports (October 04, 2009 10:54 AM)  Physical Exam  Additional Exam:  Patient is in NAD HEENT:  Normocephalic, atraumatic. EOMI, PERRLA.  Neck: JVP is normal. No thyromegaly. No bruits.  Lungs: clear to auscultation. No rales no wheezes.  Heart: Regular rate and rhythm. Normal S1, S2. No S3.  No significant murmurs. PMI not displaced.  Abdomen:  Supple, nontender. Normal bowel sounds. No masses. No hepatomegaly.  Extremities:   Good distal pulses throughout. No lower extremity edema.  Musculoskeletal :moving all extremities.  Neuro:   alert and oriented x3.    EKG  Procedure date:  10/04/2009  Findings:      Sinus bradycardia.  45 bpm.  ANterior MI.    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  I would recomm low dose HCTZ 12.5 to regimen.  continue other meds. With ECG schedule for echo.  Check BMET today  Wife to send in BP in a few  wks.  I will see after.  Problem # 2:  HYPERLIPIDEMIA (ICD-272.4) Will need to review  Other Orders: EKG w/ Interpretation (93000) TLB-BMP (Basic Metabolic Panel-BMET) (80048-METABOL) Echocardiogram (Echo)  Patient Instructions: 1)  Your physician has requested that you have an echocardiogram.  Echocardiography is a painless test that uses sound waves to create images of your heart. It provides your doctor with information about the size and shape of your heart and how well your heart's chambers and valves are working.  This procedure takes approximately one hour. There are no restrictions for this procedure. 2)  Your physician recommends that you schedule a follow-up appointment in: 3 months 3)  Your physician recommends that you return for lab work in: lab wok today BMET...we will call you with results Prescriptions: HYDROCHLOROTHIAZIDE 25 MG TABS (HYDROCHLOROTHIAZIDE) one half a tab every day  #30 x 3   Entered by:   Layne Benton, RN, BSN   Authorized by:   Sherrill Raring, MD, [facility]   Signed by:   Layne Benton, RN, BSN on 10/04/2009   Method used:   [facility]  [address] [id number]       [address]       [place], [place]  [address]       Ph: [phone] or [phone]       Fax: [phone]   RxID:   [id number]

## 2010-02-13 NOTE — Progress Notes (Signed)
Summary: PA form needs to be refaxed by drugstore LM 3-28  Phone Note Call from Patient   Caller: Patient  [phone] Summary of Call: Pt called to ck on Prior Authorization for med: Prilosec 40 mg...[name] KitchenMarland Kitchen Pt adv that [facility] is telling him that they have to have a PA done before they fill the Rx.... Pt adv that form has been faxed to [facility] for same?? Initial call taken by: Debbra Riding,  April 08, 2009 3:01 PM  Follow-up for Phone Call        Left message to have drugstore fax form again. Follow-up by: Rudy Jew, RN,  April 09, 2009 8:14 AM  Additional Follow-up for Phone Call Additional follow up Details #1::        PA IN PROCESS [facility] HAS FORM Additional Follow-up by: Heron Sabins,  April 09, 2009 10:30 AM    Additional Follow-up for Phone Call Additional follow up Details #2::    wife called again and need the status of form. Follow-up by: Warnell Forester,  April 12, 2009 12:51 PM  Additional Follow-up for Phone Call Additional follow up Details #3:: Details for Additional Follow-up Action Taken: Shanda Bumps pharmacist and pt is aware of approval Additional Follow-up by: Heron Sabins,  April 17, 2009 10:12 AM

## 2010-02-13 NOTE — Progress Notes (Signed)
Summary: QUESTIONS ABOUT MEDICATION  Phone Note Call from Patient Call back at Home Phone [phone] Call back at [phone]   Caller: Patient Summary of Call: QUESTION ABOUT MEDICATIONS Initial call taken by: Judie Grieve,  November 18, 2009 1:53 PM  Follow-up for Phone Call        Reviewed medications with patient's wife. Changed appointment time to 930am so he could go to a luncheon mid day.  Layne Benton, RN, BSN  November 18, 2009 2:41 PM

## 2010-02-13 NOTE — Letter (Signed)
Summary: Patient Notice- Polyp Results  [facility]  [address] [place], [place] [address]   Phone: [phone]  Fax: [phone]        May 04, 2009 MRN: [id number]    [name] [address] [place], [place]  [address]    Dear [name],  I am pleased to inform you that the colon polyp(s) removed during your recent colonoscopy was (were) found to be benign (no cancer detected) upon pathologic examination.The polyp was adenomatous ( precancerous)  I recommend you have a repeat colonoscopy examination in _5 years to look for recurrent polyps, as having colon polyps increases your risk for having recurrent polyps or even colon cancer in the future.  Should you develop new or worsening symptoms of abdominal pain, bowel habit changes or bleeding from the rectum or bowels, please schedule an evaluation with either your primary care physician or with me.  Additional information/recommendations:  __ No further action with gastroenterology is needed at this time. Please      follow-up with your primary care physician for your other healthcare      needs.  _x_ Please call [phone] to schedule a return visit to review your      situation.  __ Please keep your follow-up visit as already scheduled.  __ Continue treatment plan as outlined the day of your exam.  Please call us if you are having persistent problems or have questions about your condition that have not been fully answered at this time.  Sincerely,  Hart Carwin MD  This letter has been electronically signed by your physician.  Appended Document: Patient Notice- Polyp Results letter mailed 4.25.11

## 2010-02-13 NOTE — Letter (Signed)
Summary: Self-Recorded Vitals  Self-Recorded Vitals   Imported By: Marylou Mccoy 11/12/2009 15:27:03  _____________________________________________________________________  External Attachment:    Type:   Image     Comment:   External Document

## 2010-02-13 NOTE — Progress Notes (Signed)
  Walk in Patient Form Recieved " Pt left BP readings" sent to Message Nurse [facility]  November 11, 2009 9:00 AM

## 2010-02-13 NOTE — Procedures (Signed)
Summary: Upper Endoscopy/[facility]  Upper Endoscopy/[facility]   Imported By: Sherian Rein 04/22/2009 09:35:54  _____________________________________________________________________  External Attachment:    Type:   Image     Comment:   External Document

## 2010-02-13 NOTE — Progress Notes (Signed)
Summary: Patients At Home Vitals   Patients At Home Vitals   Imported By: Roderic Ovens 11/27/2009 12:29:47  _____________________________________________________________________  External Attachment:    Type:   Image     Comment:   External Document

## 2010-02-13 NOTE — Progress Notes (Signed)
Summary: Triage  Phone Note Call from Patient Call back at cell [phone]   Caller: Spouse Randa Evens cell [phone] Call For: Elia Keenum Reason for Call: Talk to Nurse Summary of Call: Wife wants patient seen this week do to poor appetite, weakness, severe constipation even with meds. Initial call taken by: Tawni Levy,  April 18, 2009 10:15 AM  Follow-up for Phone Call        Pt. c/o 3-4 weeks of abd. pain, constipation and nausea/poor appetite, wants to be seen ASAP. Pt. will see Dr.Tiahna Cure on 04-19-09 at 2:15pm. Pt. instructed to call back as needed.  Follow-up by: Laureen Ochs LPN,  April 18, 2009 10:28 AM

## 2010-02-13 NOTE — Miscellaneous (Signed)
Summary: megace rx.  Clinical Lists Changes  Medications: Added new medication of MEGACE ORAL 40 MG/ML SUSP (MEGESTROL ACETATE) take 1 tsp. =200g by mouth every day. - Signed Rx of MEGACE ORAL 40 MG/ML SUSP (MEGESTROL ACETATE) take 1 tsp. =200g by mouth every day.;  #12 oz. x 0;  Signed;  Entered by: Darlyn Read RN;  Authorized by: Hart Carwin MD;  Method [facility]  [address] [id number] [address] [place], [place], [place]  [address], Ph: [phone] or [phone], Fax: [phone]    Prescriptions: MEGACE ORAL 40 MG/ML SUSP (MEGESTROL ACETATE) take 1 tsp. =200g by mouth every day.  #12 oz. x 0   Entered by:   Darlyn Read RN   Authorized by:   Hart Carwin MD   Signed by:   Darlyn Read RN on 04/30/2009   Method used:   [facility]        [facility]  [address] [id number]       [address]       [place], [place]  [address]       Ph: [phone] or [phone]       Fax: [phone]   RxID:   [id number]

## 2010-02-13 NOTE — Progress Notes (Signed)
Summary: echo results/ diarrhea (waiting for 10/7 bmet results)  Phone Note Call from Patient   Caller: Patient [phone] Reason for Call: Talk to Nurse, Lab or Test Results Summary of Call: [phone] joanne wife wants resullts of echo-and med giving him diarrhea Initial call taken by: Glynda Jaeger,  October 18, 2009 1:41 PM  Follow-up for Phone Call        I spoke with the pt's wife. She states the pt has had a small amount of diarrhea since starting on HCTZ. His dose was recently increased from 12.5mg  every other day to 25mg  every other day due to elevated potassium on 9/27. The diarrhea is occuring more frequently with the increased dose of HCTZ. The pt did come today for a repeat bmet. The results are pending at this time. I explained to the pt's wife that I will await the results of the bmet from today and then discuss the situation with the DOD. She is agreeable. She has been informed of the pt's echo results. Follow-up by: Sherri Rad, RN, BSN,  October 18, 2009 2:19 PM  Additional Follow-up for Phone Call Additional follow up Details #1::        Spoke with Dr. Jens Som who believes that the diarrhea is not caused by HCTZ. Pt. states that he only has 1-2 episodes daily but is also taking a stool softener.Lab results from 10/7 slightly unchanged from 9/27. His potassium is slightly lower and BUN remains unchanged. Advised pt. to eat a diet low in potassium & he will come back to the office on Monday Oct.17th for a repeat BMP. Advised him to call his PCP if the diarrhea persists. Pt. is feeling okay otherwise. Whitney Maeola Sarah RN  October 18, 2009 4:47 PM   Additional Follow-up by: Whitney Maeola Sarah RN,  October 18, 2009 4:45 PM

## 2010-02-13 NOTE — Letter (Signed)
Summary: Outpatient Coinsurance Notice  Outpatient Coinsurance Notice   Imported By: Marylou Mccoy 10/23/2009 13:45:06  _____________________________________________________________________  External Attachment:    Type:   Image     Comment:   External Document

## 2010-02-13 NOTE — Progress Notes (Signed)
  Phone Note Outgoing Call   Call placed by: D.Marsella Suman Call placed to: Mrs Siuts Summary of Call: Spoke with pt's wife. He called on 05/24/2008 and canceled the appointment because he has been feeling much better. Initial call taken by: Hart Carwin MD,  May 27, 2009 5:51 PM

## 2010-02-13 NOTE — Miscellaneous (Signed)
Summary: Orders Update  Clinical Lists Changes  Orders: Added new Test order of TLB-BMP (Basic Metabolic Panel-BMET) (80048-METABOL) - Signed 

## 2010-02-13 NOTE — Letter (Signed)
Summary: [facility] Instructions  [facility]  [address] [place], [place] [address]   Phone: [phone]  Fax: [phone]       [name]    75-Mar-1931    MRN: [id number]       Procedure Day /Date:Tuesday April 26th, 2011     Arrival Time: 2:30pm     Procedure Time:3:30pm     Location of Procedure:                    _x _  [facility] (4th Floor)    PREPARATION FOR COLONOSCOPY WITH MIRALAX  Starting 5 days prior to your procedure 05/02/09 do not eat nuts, seeds, popcorn, corn, beans, peas,  salads, or any raw vegetables.  Do not take any fiber supplements (e.g. Metamucil, Citrucel, and Benefiber). ____________________________________________________________________________________________________   THE DAY BEFORE YOUR PROCEDURE         DATE: 4/25/11DAY: Monday  1   Drink clear liquids the entire day-NO SOLID FOOD  2   Do not drink anything colored red or purple.  Avoid juices with pulp.  No orange juice.  3   Drink at least 64 oz. (8 glasses) of fluid/clear liquids during the day to prevent dehydration and help the prep work efficiently.  CLEAR LIQUIDS INCLUDE: Water Jello Ice Popsicles Tea (sugar ok, no milk/cream) Powdered fruit flavored drinks Coffee (sugar ok, no milk/cream) Gatorade Juice: apple, white grape, white cranberry  Lemonade Clear bullion, consomm, broth Carbonated beverages (any kind) Strained chicken noodle soup Hard Candy  4   Mix the entire bottle of Miralax with 64 oz. of Gatorade/Powerade in the morning and put in the refrigerator to chill.  5   At 3:00 pm take 2 Dulcolax/Bisacodyl tablets.  6   At 4:30 pm take one Reglan/Metoclopramide tablet.  7  Starting at 5:00 pm drink one 8 oz glass of the Miralax mixture every 15-20 minutes until you have finished drinking the entire 64 oz.  You should finish drinking prep around 7:30 or 8:00 pm.  8   If you are nauseated, you may take the 2nd Reglan/Metoclopramide  tablet at 6:30 pm.        9    At 8:00 pm take 2 more DULCOLAX/Bisacodyl tablets.     THE DAY OF YOUR PROCEDURE      DATE:  05/07/09 DAY: Tuesday  You may drink clear liquids until 1:30pm  (2 HOURS BEFORE PROCEDURE).   MEDICATION INSTRUCTIONS  Unless otherwise instructed, you should take regular prescription medications with a small sip of water as early as possible the morning of your procedure.           Additional medication instructions: STAY ON PLAVIX!!!         OTHER INSTRUCTIONS  You will need a responsible adult at least 75 years of age to accompany you and drive you home.   This person must remain in the waiting room during your procedure.  Wear loose fitting clothing that is easily removed.  Leave jewelry and other valuables at home.  However, you may wish to bring a book to read or an iPod/MP3 player to listen to music as you wait for your procedure to start.  Remove all body piercing jewelry and leave at home.  Total time from sign-in until discharge is approximately 2-3 hours.  You should go home directly after your procedure and rest.  You can resume normal activities the day after your procedure.  The day  of your procedure you should not:   Drive   Make legal decisions   Operate machinery   Drink alcohol   Return to work  You will receive specific instructions about eating, activities and medications before you leave.   The above instructions have been reviewed and explained to me by   Marchelle Folks.     I fully understand and can verbalize these instructions _____________________________ Date _______

## 2010-02-13 NOTE — Progress Notes (Signed)
Summary: increase clonidine 0.2mg  bid   Phone Note Call from Patient   Caller: Spouse Summary of Call: wife called and requesting clonidine to be increased since he was taking clonidine 0.1mg   2 in the am and 2 pm   Follow-up for Phone Call        ok per dr stafford clonidine 0.2mg  two times a day wife aware.  Follow-up by: Pura Spice, RN,  January 30, 2009 4:51 PM

## 2010-02-13 NOTE — Progress Notes (Signed)
Summary: swelling/blood pressure  Phone Note Call from Patient Call back at [phone]   Caller: Patient Reason for Call: Talk to Nurse Summary of Call: pt has swelling all over his body. blood pressure was 162/62 this morning. pt would like to talk to a nurse. Initial call taken by: Roe Coombs,  December 17, 2009 2:35 PM  Follow-up for Phone Call        pt states he has alot of swelling in his feet and ankles-he states he has some SOB with walking up steps, but otherwise OK-this started about 1 1/2 weeks ago when Diovan was stopped and Hydralazine was started --pt denies weight gain, he states his B/P earlier today was 162/62 and that is high for him--he was told by a pharmacist he needs a fluid pill--[place] the beach right now and will return on Friday Katina Dung, RN, BSN  December 17, 2009 3:04 PM --reviewed  with Dr Calla Kicks recommended Lasix 10mg  daily--pt has a BMP scheduled for 12/25/09 and pt  is aware that he needs to keep this appt    New/Updated Medications: LASIX 20 MG TABS (FUROSEMIDE) one half tablet daily Prescriptions: LASIX 20 MG TABS (FUROSEMIDE) one half tablet daily  #5 x 0   Entered by:   Katina Dung, RN, BSN   Authorized by:   Colon Branch, MD, [facility]   Signed by:   Katina Dung, RN, BSN on 12/17/2009   Method used:   [facility] to        [facility] [phone][facility] (retail)       [address]       [place], [place]  [address]       Ph: [phone]       Fax: [phone]   RxID:   [id number]    Current Medications (verified): 1)  Aspirin Ec 325 Mg Tbec (Aspirin) .... Take 1 Tablet Once A Day 2)  Neurontin 300 Mg Caps (Gabapentin) .... Take 1 Capsule By Mouth Three Times A Day 3)  Norvasc 10 Mg Tabs (Amlodipine Besylate) .... Take 1 Tablet Once A Day 4)  Plavix 75 Mg Tabs (Clopidogrel Bisulfate) .[name] Kitchen.. 1 Qd 5)  Prilosec 40 Mg Cpdr (Omeprazole) .... One Tablet By Mouth Two Times A Day As Needed 6)  Clonidine Hcl 0.2  Mg Tabs (Clonidine Hcl) .[name] Kitchen.. 1 By Mouth Two Times A Day 7)  Stool Softener Laxative 8.6-50 Mg Tabs (Sennosides-Docusate Sodium) .... As Needed 8)  Miralax  Powd (Polyethylene Glycol 3350) .... Mix 9 Grams Into 8 Oz of Water Once Daily 9)  Hydralazine Hcl 50 Mg Tabs (Hydralazine Hcl) .... Take One Tablet By Mouth Three Times A Day 10)  Lasix 20 Mg Tabs (Furosemide) .... One Half Tablet Daily  Allergies: 1)  ! Percocet 2)  ! Codeine

## 2010-02-13 NOTE — Miscellaneous (Signed)
Summary: Orders Update  Clinical Lists Changes  Orders: Added new Test order of Renal Artery Duplex (Renal Artery Duplex) - Signed 

## 2010-02-13 NOTE — Progress Notes (Signed)
Summary: in regards to med & diet  Phone Note Call from Patient Call back at Home Phone [phone]   Caller: Spouse [phone] Reason for Call: Talk to Nurse Summary of Call: calling jackie back in regards to meds & diet.  Initial call taken by: Lorne Skeens,  November 26, 2009 12:31 PM  Follow-up for Phone Call        pt aware. I will forward to Dr. Tenny Craw to see if she wants to recheck a BMP in 1-2 weeks. Went over med. changes & dietary changes with low potassium foods. Whitney Maeola Sarah RN  November 26, 2009 12:50 PM  Follow-up by: Whitney Maeola Sarah RN,  November 26, 2009 12:50 PM    New/Updated Medications: HYDRALAZINE HCL 50 MG TABS (HYDRALAZINE HCL) Take one tablet by mouth three times a day Prescriptions: HYDRALAZINE HCL 50 MG TABS (HYDRALAZINE HCL) Take one tablet by mouth three times a day  #90 x 3   Entered by:   Ellender Hose RN   Authorized by:   Sherrill Raring, MD, [facility]   Signed by:   Ellender Hose RN on 11/26/2009   Method used:   [facility]  [address] [id number]       [address]       [place], [place]  [address]       Ph: [phone] or [phone]       Fax: [phone]   RxID:   [id number]   Appended Document: in regards to med & diet Yes I would repeat in 1 wk  Appended Document: in regards to med & diet Will check bmet at next appointment on 12/14. [name] is ware of this.

## 2010-02-13 NOTE — Progress Notes (Signed)
Summary: question about appt / k+ / lab work   Phone Note Call from Patient Call back at [employer] [phone]   Caller: Spouse Reason for Call: Talk to Nurse Summary of Call: per pt calling, still have question about appointment.  1.wants to know why  the k+ is up.  2.lab work before the 14. Initial call taken by: Lorne Skeens,  November 27, 2009 5:01 PM  Follow-up for Phone Call        Called patient's wife and reviewed medication and advised to have him return for BMET on 12/14 prior to appointment for carotid ultrasound.  Layne Benton, RN, BSN  November 27, 2009 5:56 PM

## 2010-02-13 NOTE — Progress Notes (Signed)
Summary: question on meds  Phone Note Call from Patient Call back at [phone]   Caller: Spouse/joann Reason for Call: Talk to Nurse Summary of Call: question on meds Initial call taken by: Roe Coombs,  January 02, 2010 12:27 PM  Follow-up for Phone Call        Patient's wife called to report that his BP is running 160/50 to 140/50. He has no complaints. Schedueled for Dr.Early on 01/16/2010 for carotids. Advised to call us again if BP goes any higher. Will let Dr.Ross know that they called.  Layne Benton, RN, BSN  January 02, 2010 4:01 PM

## 2010-02-13 NOTE — Miscellaneous (Signed)
  Clinical Lists Changes  Orders: Added new Test order of TLB-BMP (Basic Metabolic Panel-BMET) (80048-METABOL) - Signed 

## 2010-02-13 NOTE — Progress Notes (Signed)
Summary: gina please call today  Phone Note Call from Patient Call back at [phone]   Caller: Spouse-joann Call For: Scott Sheen MD Reason for Call: Talk to Nurse Summary of Call: spouse is requesting gina to call her concerning Brodin bp meds Dr. Kathie Rhodes cALLED AND told him what to take, see med list Initial call taken by: Heron Sabins,  January 23, 2009 2:03 PM  Follow-up for Phone Call        spoke with wife states bp this am was 174/72 at 2 pm 178/56 and last nite 147/57. states he is on diovan norvasc catapress , hctz prn clonidine. please advise also states got phone call from drug store about his enapril  Follow-up by: Pura Spice, RN,  January 23, 2009 3:04 PM  Additional Follow-up for Phone Call Additional follow up Details #1::        per dr Alfonzo Feller not to be takaing enalapril.

## 2010-02-13 NOTE — Op Note (Signed)
Summary: Upper Endoscopy and Colonoscopy    TNAME:  [name], [name]                          ACCOUNT NO.:  [id number]   MEDICAL RECORD NO.:  [id number]                   PATIENT TYPE:  AMB   LOCATION:  ENDO                                 FACILITY:  [facility]   PHYSICIAN:  [name]. Juanda Chance, M.D. [facility]            DATE OF BIRTH:  [birth year]   DATE OF PROCEDURE:  DATE OF DISCHARGE:                                 OPERATIVE REPORT   PROCEDURE:  Upper endoscopy and colonoscopy.   INDICATIONS FOR PROCEDURE:  This 77 year old gentleman has history of benign  distal esophageal stricture.  Dilatation was done two years ago.  He has  been on aspirin and Plavix because of CVA.  He has recently developed  dysphagia to solids which occurs more and more often.  He has been on  Prilosec 20 mg a day but has been taking it only erratically.  He is  undergoing upper endoscopy and esophageal dilatation.  Colonoscopy is being  done for an aplastic screening.  He is complaining of abdominal bloating,  constipation and history of hemorrhoids.  He apparently had a history of  diverticulitis which was treated with antibiotics by Dr. Scotty Court.   ENDOSCOPE:  Olympus single channel video endoscope.   SEDATION:  Versed 5 mg IV, Demerol 50 mg IV.   FINDINGS:  Olympus single channel video endoscope passed under direct vision  through posterior pharynx into the esophagitis.  The patient was monitored  by pulse oximeter.  His oxygen saturations were normal.  Proximal and mid  esophageal mucosa was unremarkable.  There was a long linear erosion to the  distal esophagus. There was also short erosion at the GE junction which was  located at 40 cm from the incisors.  There was fibrous sling in the  esophagus at the GE junction which was consistent with mild esophageal  stricture.  Diameter of the stricture was about 13 mm.   Stomach:  The stomach was insufflated with air and showed multiple erosions  and coffee grounds in  the gastric antrum.  Biopsies were taken for CLOtest.  Retroflexion of the endoscope revealed normal fundus and cardia.   Duodenum:  The duodenal bulb and descending duodenum were normal.  Guidewire  was then placed into the stomach and several dilators passed blindly without  fluoroscopic guidance through the esophagus using 15, 16, and 17 mm  dilators.  There was no blood on the dilator.  The patient tolerated the  procedure well.   IMPRESSION:  1. Benign distal esophageal stricture, status post dilatation to 69 [place].  2. Gastritis, status post CLOtest.  3. Grade II esophagitis.   PLAN:  1. Prilosec 20 mg a day.  The patient is to take it on a daily basis.  2. Antireflux measures.  3. Await results of the CLOtest.   PROCEDURE:  Colonoscopy.   ENDOSCOPE:  Olympus single channel videoscope.  SEDATION:  Additional Versed 3 mg IV, Demerol 25 mg IV.   FINDINGS:  Olympus single channel videoscope passed into the transverse and  into the sigmoid colon.  The patient was monitored by pulse oximeter with  oxygen saturations normal.  The prep was excellent.  Anal canal and rectal  ampulla was normal.  Sigmoid colon showed thickened ostial folds and few  scattered diverticula but exam was rather easy and there was no spasm or  tortuosity.  Splenic flexure, transverse colon, and hepatic flexure was  normal as was right colon and cecum.  The cecal pouch and ileocecal valve  were unremarkable.  Colonoscope was slowly retracted from the right to the  left colon.  The patient tolerated the procedure well.   IMPRESSION:  Normal colonoscopy to the cecum with minimal diverticulosis in  the left colon.   PLAN:  1. High fiber diet.  2. Use antispasmodics such as Levsin p.r.n. abdominal pain.  3. Repeat colonoscopy in 10 years.                                               [name]. Juanda Chance, M.D. [facility]    DMB/MEDQ  D:  09/05/2001  T:  09/05/2001  Job:  [id number]   cc:   Ellin Saba., M.D.

## 2010-02-13 NOTE — Progress Notes (Signed)
  Walk in Patient Form Recieved " pt left BP readings" sent to [facility]  October 30, 2009 12:58 PM

## 2010-02-13 NOTE — Progress Notes (Signed)
Summary: re test results   Phone Note Call from Patient Call back at [phone]   Caller: Daughter/sarclet Reason for Call: Talk to Nurse, Lab or Test Results Summary of Call: pt daughter is calling regard test results. pt daughter states someone told her father the test was not good and that someone will contact him. no one has contacted him. pt daughter states she would like to talk to a nurse. pt daughter states she called dr. Deborah Chalk because she states she works with dr Deborah Chalk in the er and ask dr Deborah Chalk to review the tests. pt duaghter states dr Deborah Chalk called her ago and told her that he has reviewed the test and her father should be seen by a doctor and not to wait because the test was not good.   Pt daughter is aware that dr Tenny Craw nurse in not in today but i would send this message to [facility] triage nurse/ important. Initial call taken by: Roe Coombs,  December 26, 2009 9:44 AM  Follow-up for Phone Call        RN reviewed Carotid Preliminary report with Dr Clifton Kayron and Dr. Eden Emms. (Dr Tenny Craw was out of the office). RN s/w daughter - Marylouise Stacks - re: carotid report she discussed with Dr Deborah Chalk. Scarlett expressed concern re: results and provided RN with another number to reach Pt and [name] [phone], answering machine: RN Left Message To Call Back. Bernita Raisin, RN, BSN  December 26, 2009 11:33 AM  RN s/w Dorita Fray re: message. Bernita Raisin, RN, BSN  December 26, 2009 11:35 AM   RN received call from [name] - Chyrl Civatte - CTA scheduled for today at 3:00PM. RN advised Pt cannot eat after 1:00PM and should arrive at 2:45PM. [name] verbalizes understanding. Bernita Raisin, RN, BSN  December 26, 2009 12:08 PM   Follow-up by: Bernita Raisin, RN, BSN,  December 26, 2009 12:08 PM     Appended Document: re test results  Spoke with family re CT results.  Appt scheduled.

## 2010-02-13 NOTE — Procedures (Signed)
Summary: Colonoscopy  Patient: [name] Note: All result statuses are Final unless otherwise noted.  Tests: (1) Colonoscopy (COL)   COL Colonoscopy           DONE     [facility]     [address]     [place], [place]  [address]           COLONOSCOPY PROCEDURE REPORT           PATIENT:  [name], [name]  MR#:  [id number]     BIRTHDATE:  1929/01/16, 79 yrs. old  GENDER:  male     ENDOSCOPIST:  [name]. Juanda Chance, MD     REF. BY:  Dianna Limbo, M.D.     PROCEDURE DATE:  04/30/2009     PROCEDURE:  Colonoscopy 13244     ASA CLASS:  Class III     INDICATIONS:  abdominal pain, change in bowel habits post prandial     abd. pain     last colon 08/2001 mild diverticulosis     MEDICATIONS:   Versed 3 mg, Fentanyl 25 mcg           DESCRIPTION OF PROCEDURE:   After the risks benefits and     alternatives of the procedure were thoroughly explained, informed     consent was obtained.  Digital rectal exam was performed and     revealed no rectal masses.   The LB CF-H180AL [id number] endoscope     was introduced through the anus and advanced to the cecum, which     was identified by both the appendix and ileocecal valve, without     limitations.  The quality of the prep was good, using MiraLax.     The instrument was then slowly withdrawn as the colon was fully     examined.     <<PROCEDUREIMAGES>>           FINDINGS:  A sessile polyp was found in the ascending colon. The     polyp was removed using cold biopsy forceps (see image4).  This     was otherwise a normal examination of the colon (see image5,     image3, image2, and image1).   Retroflexed views in the rectum     revealed no abnormalities.    The scope was then withdrawn from     the patient and the procedure completed.           COMPLICATIONS:  None     ENDOSCOPIC IMPRESSION:     1) Sessile polyp in the ascending colon     2) Otherwise normal examination     RECOMMENDATIONS:     1) Await pathology results     nothing to  account for the change in bowl habits and weight     loss.     continue Miralax 9 gm prn constipation     REPEAT EXAM:  In 7 year(s) for.           ______________________________     [name]. Juanda Chance, MD           CC:           n.     eSIGNED:   [name]. Nero Sawatzky at 04/30/2009 05:31 PM           Ramzi, [place], [id number]  Note: An exclamation mark (!) indicates a result that was not dispersed into the flowsheet. Document Creation Date: 04/30/2009 5:32 PM _______________________________________________________________________  (1) Order result  status: Final Collection or observation date-time: 04/30/2009 17:10 Requested date-time:  Receipt date-time:  Reported date-time:  Referring Physician:   Ordering Physician: Lina Sar [phone]) Specimen Source:  Source: Launa Grill Order Number: [id number] Lab site:   Appended Document: Colonoscopy     Procedures Next Due Date:    Colonoscopy: 05/2014  Appended Document: Colonoscopy reviewed

## 2010-02-13 NOTE — Progress Notes (Signed)
Summary: meds  Phone Note Call from Patient Call back at [phone]   Caller: Daughter/joanna Reason for Call: Talk to Nurse Summary of Call: pt has question on meds. Initial call taken by: Roe Coombs,  December 23, 2009 1:26 PM  Follow-up for Phone Call        Pt. BP 160/64 before medication and 142/64 after his BP medication. States his HR does not generally run greater than the lower 50's. He was taken off of Hydralazine b/c he started having increased swelling & SOB. He was started on 10mg  of Lasix at this time. Diovan has been stopped due to hyperkalemia. He is calling today to let us know that he has increased his Clonidine to three times a day. He is feeling much better. I spoke with Dr. Tenny Craw to see if she wanted to add another BP medication and she would prefer the pt. to come to the office for a BP check & EKG. He will also get blood work for a BMP/BNP on 12/14 when he comes in for  his carotid duplex. Pt. is aware. I advised him to stay on his current medications at this time until he comes on 12/25/09. Whitney Maeola Sarah RN  December 23, 2009 2:01 PM  Follow-up by: Whitney Maeola Sarah RN,  December 23, 2009 2:02 PM

## 2010-02-13 NOTE — Procedures (Signed)
Summary: Upper Endoscopy  Patient: [name] Note: All result statuses are Final unless otherwise noted.  Tests: (1) Upper Endoscopy (EGD)   EGD Upper Endoscopy       DONE     [facility]     [address]     [place], [place]  [address]           ENDOSCOPY PROCEDURE REPORT           PATIENT:  [name], [name]  MR#:  [id number]     BIRTHDATE:  1929-04-10, 79 yrs. old  GENDER:  male           ENDOSCOPIST:  [name]. Juanda Chance, MD     Referred by:  Dianna Limbo, M.D.           PROCEDURE DATE:  04/30/2009     PROCEDURE:  EGD with biopsy     ASA CLASS:  Class II     INDICATIONS:  weight loss, abdominal pain poor apetite     abd.pain after meals, lost 11 lbs     last EGD 08/2001 es. stricture, s/p dil with 17mm           MEDICATIONS:   Versed 5 mg, Fentanyl 50 mcg     TOPICAL ANESTHETIC:  Exactacain Spray           DESCRIPTION OF PROCEDURE:   After the risks benefits and     alternatives of the procedure were thoroughly explained, informed     consent was obtained.  The LB GIF-H180 [id number] endoscope was     introduced through the mouth and advanced to the second portion of     the duodenum, without limitations.  The instrument was slowly     withdrawn as the mucosa was fully examined.     <<PROCEDUREIMAGES>>           A stricture was found in the distal esophagus. mild 15 mm benign     appearing es (see image1, image8, and image9). stricture Savary     dilation over a guidewire 16 mm dilator passed over the guiodewire     Mild gastritis was found. drcrease rugal folds, r/o atrophic     gastritis With standard forceps, a biopsy was obtained and sent to     pathology (see image6, image7, and image2).  Otherwise the     examination was normal. With standard forceps, a biopsy was     obtained and sent to pathology (see image6). r/o villous atrophy     Retroflexed views revealed no abnormalities.    The scope was then     withdrawn from the patient and the procedure  completed.           COMPLICATIONS:  None           ENDOSCOPIC IMPRESSION:     1) Stricture in the distal esophagus     2) Mild gastritis     3) Otherwise normal examination     s/p passage of 16 mm dilator over the guidewire     /o atrophic gastritis     RECOMMENDATIONS:     1) Await biopsy results     stop Prelosec,may take Mylanta @ 30cc po tid prn abd pain           REPEAT EXAM:  In 0 year(s) for.           ______________________________     [name]. Juanda Chance, MD  CC:           n.     eSIGNED:   Gertha Lichtenberg M. Toy Eisemann at 04/30/2009 05:24 PM           Bonenberger, Fayrene Fearing, [id number]  Note: An exclamation mark (!) indicates a result that was not dispersed into the flowsheet. Document Creation Date: 04/30/2009 5:25 PM _______________________________________________________________________  (1) Order result status: Final Collection or observation date-time: 04/30/2009 16:52 Requested date-time:  Receipt date-time:  Reported date-time:  Referring Physician:   Ordering Physician: Lina Sar [phone]) Specimen Source:  Source: Launa Grill Order Number: [id number] Lab site:   Appended Document: Upper Endoscopy reviewed

## 2010-02-13 NOTE — Progress Notes (Signed)
Summary: wants ct results  Phone Note Call from Patient   Caller: Patient [phone] Reason for Call: Talk to Nurse, Lab or Test Results Summary of Call: wants ct results Initial call taken by: Glynda Jaeger,  December 27, 2009 1:08 PM  Follow-up for Phone Call        Dr. Tenny Craw spoke with pt. and a family member. Office visit on 10/04/09, 11/25/09 and Ct Angiogram were faxed to Dr. Greggory Keen to fax# [phone]. as requested per Dr. Tenny Craw. Follow-up by: Ollen Gross, RN, BSN,  December 27, 2009 2:11 PM

## 2010-02-13 NOTE — Progress Notes (Signed)
Summary: rubbing sound in chest when breathing  Phone Note Call from Patient Call back at Home Phone [phone] Call back at [phone]   Caller: Spouse/Joanne Summary of Call: When pt breathing  hearing a rubbing sound in chest when x-hale Initial call taken by: Judie Grieve,  December 09, 2009 1:09 PM  Follow-up for Phone Call        Called patient back and spoke to his wife. She states that he has a rubbing sound in his chest when he exhales and feels generally weak. She states that he was doing yard work with leaves and did not use a mask. She states that he used a Serevent inhaler and felt better. She thinks that his blood pressure medication is making him feel week. I offered and appointment with the DOD for tomorrow but the patient declined per his wife. They will call back tomorrow if he changes his mind.    Layne Benton, RN, BSN  December 09, 2009 7:06 PM

## 2010-02-13 NOTE — Letter (Signed)
Summary: Self-Recorded Vitals  Self-Recorded Vitals   Imported By: Marylou Mccoy 02/03/2010 16:16:23  _____________________________________________________________________  External Attachment:    Type:   Image     Comment:   External Document

## 2010-02-13 NOTE — Progress Notes (Signed)
Summary: test results  Phone Note Call from Patient Call back at Home Phone [phone]   Caller: Patient Reason for Call: Lab or Test Results Initial call taken by: Judie Grieve,  November 14, 2009 3:19 PM  Follow-up for Phone Call        Spoke with pt's wife regarding labs. BMET  results given. Pt. and wife verbalized understanding. Follow-up by: Ollen Gross, RN, BSN,  November 14, 2009 4:03 PM

## 2010-02-13 NOTE — Progress Notes (Signed)
  Walk in Patient Form Recieved " Pt left BP readings" sent to [facility]  October 15, 2009 1:20 PM

## 2010-02-13 NOTE — Progress Notes (Signed)
Summary: test results  Phone Note Call from Patient Call back at Home Phone [phone]   Caller: Patient Reason for Call: Lab or Test Results Initial call taken by: Judie Grieve,  November 12, 2009 3:20 PM  Follow-up for Phone Call        pt given lab results, he dropped off BP readings they are good in AM and are high in PM, he states he is urinating all the time, will send info and BP readings to Dr Tenny Craw for review Meredith Staggers, RN  November 12, 2009 3:43 PM   Additional Follow-up for Phone Call Additional follow up Details #1::        With heavy urination would stop HCTZ.  Will need to cut diovan in 1/2 (80) given increased KCL. Would continue Amlodipine and Clonidine. Would add Hydralzine 25 mg 3x per day.  May incease to 50 three times a day.    Patient should have a renal USN to r/o stenosis. Also.  If he snores heavily consider sleep eval for apnea. Set f/u in clnic for 2 wks. Additional Follow-up by: Sherrill Raring, MD, [facility],  November 13, 2009 4:21 AM     Appended Document: test results    Clinical Lists Changes  Medications: Removed medication of HYDROCHLOROTHIAZIDE 25 MG TABS (HYDROCHLOROTHIAZIDE) one tab every day Changed medication from DIOVAN 160 MG TABS (VALSARTAN) 1 by mouth once daily to DIOVAN 80 MG TABS (VALSARTAN) Take one tablet by mouth daily Added new medication of HYDRALAZINE HCL 25 MG TABS (HYDRALAZINE HCL) Take one tablet by mouth three times a day - Signed Rx of HYDRALAZINE HCL 25 MG TABS (HYDRALAZINE HCL) Take one tablet by mouth three times a day;  #90 x 3;  Signed;  Entered by: Whitney Maeola Sarah RN;  Authorized by: Sherrill Raring, MD, [facility];  Method used: Electronically to [facility]  [address] [id number] [address] [place], [place], [place]  [address], Ph: [phone] or [phone], Fax: [phone]    Prescriptions: HYDRALAZINE HCL 25 MG TABS (HYDRALAZINE HCL) Take one tablet by mouth three times a day  #90 x 3   Entered by:    Ellender Hose RN   Authorized by:   Sherrill Raring, MD, [facility]   Signed by:   Ellender Hose RN on 11/13/2009   Method used:   Electronically to        [facility]  [address] [id number] (retail)       [address]       [place], [place]  [address]       Ph: [phone] or [phone]       Fax: [phone]   RxID:   [id number]    Appended Document: test results Pt.[name] Kitchen aware to make these medication changes. He will start with the 25mg  hydralazine three times a day and will keep recording his BP and bring those results to his f/u with Dr. Tenny Craw. He will call us back if his BP remains high & we can inrease the hydralazine to 50mg  by mouth three times a day.  I will send this information to the scheduler to make an appt. for his renal duplex & f/u with Tenny Craw in 2 weeks. Pt. will monitor his snoring and will f/u with PCP if concern for sleep apnea persists.

## 2010-02-13 NOTE — Assessment & Plan Note (Signed)
Summary: Gastroenterology  [name]  MR#:  [id number] Page #  [name]   GASTROENTEROLOGY OFFICE   CONSULTATION NOTE  NAME:  [name], [name]   OFFICE NO:  [id number]  DATE:  08/22/01  REFERRING PHYSICIAN:  Dr. Scotty Court  PROBLEMS: 1.  Postprandial abdominal bloating. 2.  Screening colonoscopy.  HISTORY OF PRESENT ILLNESS:  The patient is a pleasant 77 year old white male, who is status post CVA approximately two years ago.  He is currently maintained on aspirin and Plavix.  He does have a history of chronic reflux.  He had undergone upper endoscopy in April of 2001 with finding of distal esophageal stricture.  He was salivary dilated to 17 mm and is currently maintained on Prilosec 20 mg p.o. q.d., which he says works well.  He has no current complaints of dysphagia or odynophagia.     The patient had undergone remote flexible sigmoidoscopy with Dr. Kinnie Scales in 1990 and this was a negative exam.  The patient reports that he was treated for diverticulitis by Dr. Scotty Court a couple of years ago and believes that he was treated symptomatically and improved with antibiotics.  He has noted more recently that he has been having a lot of bloating and swelling after meals.  He says this does not occur after every meal but usually occurs after his evening meals or with higher fat, greasier meals.  He denies any abdominal pain, nausea, or vomiting, just gets bloated.  He says his bowel movements have been normal, usually q.d. to q.o.d.  He has not noted any melena but has had a history of hemorrhoids and does occasionally see a small amount of blood on tissue.  His weight has been stable.  His appetite has been fine.  He also reports that he was treated recently for prostatitis by Dr. Scotty Court.     CURRENT MEDICATIONS:  Norvasc 10 mg q.d., Diovan 160 mg q.d., Plavix 75 mg q.d., aspirin 325 mg q.d., Neurontin 300 mg two p.o. q.d., and Prilosec 20 mg p.o. q.d.  FAMILY HISTORY:  Negative for colon cancer  and polyps.  SOCIAL HISTORY:  The patient is married.  He is retired.  He does work part-time at the [employer].  No tobacco x 7 years.  No ethyl alcohol.  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    PHYSICAL EXAMINATION:  This is a well-developed white male in no acute distress.  Weight is 186 pounds.  Blood pressure 144/60.  Pulse is 72.  Cardiovascular:  Regular rate and rhythm with S1 and S2.  Pulmonary:  Clear.  He does have an occasional expiratory wheeze.  Abdomen is soft.  He describes numbness in his left abdomen and exam per tenderness is difficult.  He is jumpy but there is no focal tenderness and no palpable mass or hepatosplenomegaly.  Bowel sounds are active.  Rectal exam not done today.    IMPRESSION: 76.  A 77 year old white male referred for screening colonoscopy with complaints of postprandial abdominal gas and bloating. 2.  History of hemorrhoids with occasional hematochezia - rule out occult colon lesion. 3.  Gastroesophageal reflux disease - controlled with Prilosec with occasional mild dysphasia and with prior history of esophageal stricture, no current symptoms - rule out recurrent stricture. 4.  History of cerebrovascular accident.    PLAN: 1.  Schedule colonoscopy with patient off of aspirin and Plavix.   2.  Extended daily dialysis and dilation to  be done simultaneously while the patient is off of aspirin and Plavix.   3.  Trial of a low-gas diet and p.r.n. Gas-X or Phazyme with meals. 4.  Continue Prilosec 20 mg p.o. q.d.    [name], P.A.-C.           [name]. Juanda Chance, M.D.  [id number] cc:  Ellin Saba., M.D. D:  08/22/01; T:  ; Job [phone]

## 2010-02-13 NOTE — Assessment & Plan Note (Signed)
Summary: ekg/bp/402.10  Nurse Visit pt here for bp check and EKG. pt denies any problems, just states he feels alittle sluggish. he brought with him a log of bp readings, they will be left for dr Tenny Craw review.  Vital Signs:  Patient profile:   77 year old male Weight:      178 pounds Pulse rate:   48 / minute Pulse rhythm:   regular BP sitting:   132 / 50  (left arm) Cuff size:   regular  Vitals Entered By: Deliah Goody, RN (December 25, 2009 3:31 PM)  Current Medications (verified): 1)  Aspirin Ec 325 Mg Tbec (Aspirin) .... Take 1 Tablet Once A Day 2)  Neurontin 300 Mg Caps (Gabapentin) .... Take 1 Capsule By Mouth Three Times A Day 3)  Norvasc 10 Mg Tabs (Amlodipine Besylate) .... Take 1 Tablet Once A Day 4)  Plavix 75 Mg Tabs (Clopidogrel Bisulfate) .[name] Kitchen.. 1 Qd 5)  Prilosec 40 Mg Cpdr (Omeprazole) .... One Tablet By Mouth Two Times A Day As Needed 6)  Clonidine Hcl 0.2 Mg Tabs (Clonidine Hcl) .[name] Kitchen.. 1 By Mouth Three Times A Day 7)  Stool Softener Laxative 8.6-50 Mg Tabs (Sennosides-Docusate Sodium) .... As Needed 8)  Miralax  Powd (Polyethylene Glycol 3350) .... Mix 9 Grams Into 8 Oz of Water Once Daily 9)  Lasix 20 Mg Tabs (Furosemide) .... One Half Tablet Daily  Allergies (verified): 1)  ! Percocet 2)  ! Codeine Prescriptions: LASIX 20 MG TABS (FUROSEMIDE) one half tablet daily  #30 x 12   Entered by:   Deliah Goody, RN   Authorized by:   Sherrill Raring, MD, [facility]   Signed by:   Deliah Goody, RN on 12/25/2009   Method used:   [facility]  [id number]       [address]       [place], [place]  [address]       Ph: [phone] or [phone]       Fax: [phone]   RxID:   [id number]

## 2010-02-13 NOTE — Progress Notes (Signed)
Summary: Cardiology - Carotid Doppler Resutls  Phone Note Call from Patient Call back at [phone]   Caller: Daughter-Scarlett Reason for Call: Talk to Doctor Summary of Call: Retured call from an upset pt's daughter over the information her father was given concerning his carotid dopplers today in the office.  Dopplers were performed by nursing staff today and the pt was told that he had severe disease.  Per daughert, The nursing staff then left to find a doctor to show the results too and was unable to find someone to review them.  The nurse then came back to the room and said she could not find anyone and someone from the office would call the patient in the morning to disuss the results.  The pt's daughter felt this was not acceptable and wanted to know why her father was not given any further information as well as further treatment for such severe disease.  She felt that with the pt's history he could have a stroke overnight with such severe disease.  I was unable to find the results of the dopplers in the system and explained this to the daughter.  I ensured her that the patient would not have been able to go home if there was an acute situation at hand.  She was not satisfied with this response and wanted to speak to Dr. Deborah Chalk.  She said she worked with him and he should call her personally.  I apologized to the patient for todays events and told her I would let Dr. Deborah Chalk know.  Daughter was still frustrated by end of conversation.  I have tried to reach Dr. Deborah Chalk without a response and have therefore left a message for him to call the daughter (leaving her phone number for him).  Please call the daughter and let her know the results of her father's dopplers when the results are available.  Initial call taken by: Robbi Garter NP-PA,  December 25, 2009 10:19 PM

## 2010-02-13 NOTE — Progress Notes (Signed)
Summary: please advise  Phone Note Call from Patient Call back at [phone]   Caller: Spouse-live call Summary of Call: Had discussed increasing Catapres. He will run out tomorrow. call [facility] on [address]. please call in generic form. bp is 190/60. please return call today. Initial call taken by: Warnell Forester,  January 30, 2009 2:32 PM  Follow-up for Phone Call        refilled clonidine Follow-up by: Judithann Sheen MD,  January 30, 2009 3:00 PM

## 2010-02-13 NOTE — Procedures (Signed)
Summary: Upper Endoscopy/[facility]  Upper Endoscopy/[facility]   Imported By: Sherian Rein 04/22/2009 09:31:53  _____________________________________________________________________  External Attachment:    Type:   Image     Comment:   External Document

## 2010-02-13 NOTE — Progress Notes (Signed)
Summary: rx hydrocodoen 7.5/650  w 5 refills   Phone Note From Pharmacy   [facility]  [address] [id number] Reason for Call: Needs renewal Summary of Call: refill hydrocodone  Initial call taken by: Pura Spice, RN,  July 03, 2009 9:30 AM  Follow-up for Phone Call        ok x 5 per dr Scotty Court,  Follow-up by: Pura Spice, RN,  July 03, 2009 9:31 AM    New/Updated Medications: HYDROCODONE-ACETAMINOPHEN 7.5-650 MG TABS (HYDROCODONE-ACETAMINOPHEN) 1 by mouth every 4-6 hrs as needed pain. not to exceed 4 per day. Prescriptions: HYDROCODONE-ACETAMINOPHEN 7.5-650 MG TABS (HYDROCODONE-ACETAMINOPHEN) 1 by mouth every 4-6 hrs as needed pain. not to exceed 4 per day.  #100 x 5   Entered by:   Pura Spice, RN   Authorized by:   Judithann Sheen MD   Signed by:   Pura Spice, RN on 07/03/2009   Method used:   Telephoned to ...       [facility]  [address] [id number]       [address]       [place], [place]  [address]       Ph: [phone] or [phone]       Fax: [phone]   RxID:   [id number]

## 2010-02-13 NOTE — Miscellaneous (Signed)
  Clinical Lists Changes  Medications: Changed medication from HYDROCHLOROTHIAZIDE 25 MG TABS (HYDROCHLOROTHIAZIDE) one half a tab every day to HYDROCHLOROTHIAZIDE 25 MG TABS (HYDROCHLOROTHIAZIDE) one tab every day

## 2010-02-13 NOTE — Procedures (Signed)
Summary: Flexible Sigmoidoscopy/[facility]  Flexible Sigmoidoscopy/[facility]   Imported By: Sherian Rein 04/22/2009 09:34:27  _____________________________________________________________________  External Attachment:    Type:   Image     Comment:   External Document

## 2010-02-13 NOTE — Progress Notes (Signed)
Summary: TRIAGE-Endo/Colon moved to 04-30-09  Phone Note Call from Patient Call back at [phone]   Caller: Patient Call For: Juanda Chance Reason for Call: Talk to Doctor Summary of Call: Patient wants to speak directly to Dr Juanda Chance. Initial call taken by: Tawni Levy,  April 26, 2009 3:28 PM  Follow-up for Phone Call        Per Mrs.Nudo, pt. takes Miralax daily, only has a BM 2-3 X weekly, after he takes a suppository. He also takes 2 stool softners two times a day. Pt. wife is very anxious, states pt. has epigastric pain after every meal, has lost 15 pounds over the last 2-3 monthes.  "Tell her Darcella Cheshire is very worried, all my kids are worried, something needs to be done!" "Tell her if he was 50 or 60 I may not worry, but he is 7 and he is just not well, I am so worried."   MRS.Segovia CELL# [id number]  1) Increase Miralax to two times a day 2) Soft,bland diet. No spicy,greasy,fried foods. Boost ot Ensure 2-3 times daily. 3) Continue Prilosec two times a day 4) Keep Endo/Colon appt. 05-07-09 5) If symptoms become worse call back immediately or go to ER.  DR.BRODIE PLEASE ADVISE Follow-up by: Laureen Ochs LPN,  April 26, 2009 3:53 PM  Additional Follow-up for Phone Call Additional follow up Details #1::        Dr.Brodie spoke with Mrs.Depascale over the weekend. Pt. Endo/Colon has been moved to 04-30-09 at 4pm,(approved by Quincy Carnes RN) all instructions have been updated by phone, Mrs.Michel will callback as needed. Additional Follow-up by: Laureen Ochs LPN,  April 29, 2009 8:51 AM

## 2010-02-13 NOTE — Medication Information (Signed)
Summary: Coverage Approval for Omeprazole  Coverage Approval for Omeprazole   Imported By: Maryln Gottron 04/19/2009 14:09:13  _____________________________________________________________________  External Attachment:    Type:   Image     Comment:   External Document

## 2010-02-13 NOTE — Progress Notes (Signed)
Summary: referral  Phone Note Call from Patient Call back at [phone]   Caller: wife, Randa Evens Call For: Dr. Juanda Chance Reason for Call: Talk to Nurse Summary of Call: needing a referral to a cardiologist Initial call taken by: Vallarie Mare,  September 13, 2009 1:44 PM  Follow-up for Phone Call        Dr Juanda Chance patient's wife is requesting a referral to one of the [facility] to help monitor his HTN.  Please advise who you suggest. Follow-up by: Darcey Nora RN, CGRN,  September 17, 2009 8:45 AM  Additional Follow-up for Phone Call Additional follow up Details #1::        DR Dietrich Pates Additional Follow-up by: Hart Carwin MD,  September 17, 2009 9:30 AM    Additional Follow-up for Phone Call Additional follow up Details #2::    Appt scheduled with Asher Muir at Cardiology for 10/04/09 10:30.  Patient  and his wife are aware of the appointment details and location of [facility] Follow-up by: Darcey Nora RN, CGRN,  September 17, 2009 11:19 AM

## 2010-02-13 NOTE — Miscellaneous (Signed)
  Clinical Lists Changes  Medications: Removed medication of DIOVAN 80 MG TABS (VALSARTAN) Take one tablet by mouth daily Changed medication from HYDRALAZINE HCL 25 MG TABS (HYDRALAZINE HCL) Take one tablet by mouth three times a day to HYDRALAZINE HCL 25 MG TABS (HYDRALAZINE HCL) Take two  tablets  by mouth three times a day

## 2010-02-13 NOTE — Letter (Signed)
Summary: Patient Notice- Polyp Results  [facility]  [address] [place], [place] [address]   Phone: [phone]  Fax: [phone]        May 06, 2009 MRN: [id number]    [name] [address] [place], [place]  [address]    Dear [name],  I am pleased to inform you that the colon polyp(s) removed during your recent colonoscopy was (were) found to be benign (no cancer detected) upon pathologic examination.The polyp was adenomatous ( precancerou)  I recommend you have a repeat colonoscopy examination in 5_ years to look for recurrent polyps, as having colon polyps increases your risk for having recurrent polyps or even colon cancer in the future.  Should you develop new or worsening symptoms of abdominal pain, bowel habit changes or bleeding from the rectum or bowels, please schedule an evaluation with either your primary care physician or with me.  Additional information/recommendations:  _x_ No further action with gastroenterology is needed at this time. Please      follow-up with your primary care physician for your other healthcare      needs.  __ Please call [phone] to schedule a return visit to review your      situation.  __ Please keep your follow-up visit as already scheduled.  __ Continue treatment plan as outlined the day of your exam.  Please call us if you are having persistent problems or have questions about your condition that have not been fully answered at this time.  Sincerely,  Hart Carwin MD  This letter has been electronically signed by your physician.  Appended Document: Patient Notice- Polyp Results letter mailed 4.26.11

## 2010-02-13 NOTE — Letter (Signed)
Summary: Patient Notice-Endo Biopsy Results  [facility]  [address] [place], [place] [address]   Phone: [phone]  Fax: [phone]        May 06, 2009 MRN: [id number]    [name] [address] [place], [place]  [address]    Dear [name],  I am pleased to inform you that the biopsies taken during your recent endoscopic examination did not show any evidence of cancer upon pathologic examination.  Additional information/recommendations:  __No further action is needed at this time.  Please follow-up with      your primary care physician for your other healthcare needs.  __ Please call [phone] to schedule a return visit to review      your condition.  _x_ Continue with the treatment plan as outlined on the day of your      exam.     Please call us if you are having persistent problems or have questions about your condition that have not been fully answered at this time.  Sincerely,  Hart Carwin MD  This letter has been electronically signed by your physician.  Appended Document: Patient Notice-Endo Biopsy Results letter mailed 4.26.11

## 2010-02-13 NOTE — Progress Notes (Signed)
Summary: Triage  Phone Note Call from Patient Call back at [phone]   Caller: Spouse Randa Evens Call For: Dr. Juanda Chance Reason for Call: Talk to Nurse Summary of Call: Pt.'s wife is upset b/c she went to pharmacy 3 times on 04-19-09 and meds were not there. Had to call Dr. Russella Dar on call to have meds sent to pharmacy. Pt is very weak and constipated and feels like he needs to have his colonoscopy before 05-07-09.  Initial call taken by: Karna Christmas,  April 22, 2009 2:30 PM  Follow-up for Phone Call        Pt. was seen 04-19-09, never started Miralax. Continues w/constipation. He will begin the Miralax as directed and keep appt. on 05-07-09 for Endo/Colon. Pt. instructed to call back as needed.  Follow-up by: Laureen Ochs LPN,  April 22, 2009 3:21 PM

## 2010-02-13 NOTE — Assessment & Plan Note (Signed)
Summary: f2w  apt time 930 am confirmed with wife/jr   Visit Type:  2wk follow up Referring Provider:  n/a Primary Provider:  Rickard Patience, MD   CC:  dizziness and swelling in L-foot.  History of Present Illness: Patient is a 77 year old who I saw for the first time for hypertension in September.  I added 12.5 mg HCTX to his regimen.  He did not tolerate because of frequent urination.  I stopped this.  Recomm with this that he cut back on Diovan to 80 due to hyperkalemia.  I also added Hydralazine to his regimen. He had an echo done that showed moderate LVH.  NOrmal LV function.  Renal USN was without RAS Since seen he has been doing well. NO chest pain.  Urination is better now that he is off HCTZ Takes Diovan and Norvasc at night.   BP log:  AM 120s to 130s.  PM (9)  140s to 160s.  Current Medications (verified): 1)  Aspirin Ec 325 Mg Tbec (Aspirin) .... Take 1 Tablet Once A Day 2)  Neurontin 300 Mg Caps (Gabapentin) .... Take 1 Capsule By Mouth Three Times A Day 3)  Norvasc 10 Mg Tabs (Amlodipine Besylate) .... Take 1 Tablet Once A Day 4)  Plavix 75 Mg Tabs (Clopidogrel Bisulfate) .[name] Kitchen.. 1 Qd 5)  Diovan 80 Mg Tabs (Valsartan) .... Take One Tablet By Mouth Daily 6)  Prilosec 40 Mg Cpdr (Omeprazole) .... One Tablet By Mouth Two Times A Day As Needed 7)  Clonidine Hcl 0.2 Mg Tabs (Clonidine Hcl) .[name] Kitchen.. 1 By Mouth Two Times A Day 8)  Stool Softener Laxative 8.6-50 Mg Tabs (Sennosides-Docusate Sodium) .... As Needed 9)  Miralax  Powd (Polyethylene Glycol 3350) .... Mix 9 Grams Into 8 Oz of Water Once Daily 10)  Hydralazine Hcl 25 Mg Tabs (Hydralazine Hcl) .... Take One Tablet By Mouth Three Times A Day  Allergies (verified): 1)  ! Percocet 2)  ! Codeine  Past History:  Past medical, surgical, family and social histories (including risk factors) reviewed, and no changes noted (except as noted below).  Past Medical History: Reviewed history from 10/03/2009 and no changes  required. Hypertension Stroke, right thalamic 8-01 per Dr. Anne Hahn Prostate cancer, hx of, per Dr. Vonita Moss Benign distal esophageal stricture Gastritis Grade II esophagitis Diverticulosis Hemorrhoids GERD Allergic rhinitis  Past Surgical History: Reviewed history from 10/03/2009 and no changes required. cervical spine surgery 8-07 per Dr. Channing Mutters lumbar spine surgery Appendectomy Cataract extraction radioactive seeds in prostate  Family History: Reviewed history from 10/03/2009 and no changes required.  Mother died of, "old age".  Father died with black lung disease.  Five to six brothers and sisters - one brother died with MI and onewith cancer.  Social History: Reviewed history from 10/03/2009 and no changes required. Married Retired Patient is a former smoker.  Alcohol Use - no Smokeless Tobacco: Chewing Tobacco   Vital Signs:  Patient profile:   77 year old male Height:      68 inches Weight:      174.50 pounds BMI:     26.63 Pulse rate:   58 / minute BP sitting:   130 / 60  (left arm) Cuff size:   regular  Vitals Entered By: Caralee Ates CMA (November 25, 2009 9:35 AM)  Physical Exam  Additional Exam:  Patient is in NAD HEENT:  Normocephalic, atraumatic. EOMI, PERRLA.  Neck: JVP is normal. No thyromegaly. L carotid bruit.  Lungs: clear to auscultation. No  rales no wheezes.  Heart: Regular rate and rhythm. Normal S1, S2. No S3.   No significant murmurs. PMI not displaced.  Abdomen:  Supple, nontender. Normal bowel sounds. No masses. No hepatomegaly.  Extremities:   Good distal pulses throughout. No lower extremity edema.  Musculoskeletal :moving all extremities.  Neuro:   alert and oriented x3.    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  I have asked him to take Norvasc in AM.  Continue other meds as is.  Mail in log q 2 wks.  May need to increase hydralazine. Check BMET  today. His updated medication list for this problem includes:    Aspirin Ec 325 Mg Tbec (Aspirin) .[name] Kitchen... Take 1 tablet once a day    Norvasc 10 Mg Tabs (Amlodipine besylate) .[name] Kitchen... Take 1 tablet once a day    Diovan 80 Mg Tabs (Valsartan) .[name] Kitchen... Take one tablet by mouth daily    Clonidine Hcl 0.2 Mg Tabs (Clonidine hcl) .[name] Kitchen... 1 by mouth two times a day    Hydralazine Hcl 25 Mg Tabs (Hydralazine hcl) .[name] Kitchen... Take one tablet by mouth three times a day  Problem # 2:  CAROTID BRUIT, LEFT (ICD-785.9) Carotid USN.  Other Orders: T-Basic Metabolic Panel [phone]) Carotid Duplex (Carotid Duplex)  Patient Instructions: 1)  Your physician has requested that you have a carotid duplex. This test is an ultrasound of the carotid arteries in your neck. It looks at blood flow through these arteries that supply the brain with blood. Allow one hour for this exam. There are no restrictions or special instructions. 2)  Your physician recommends that you return for lab work in: bmet today 3)  Your physician recommends that you schedule a follow-up appointment in: 4 months

## 2010-02-13 NOTE — Progress Notes (Signed)
Summary: refill on hydrocodone/act 7.5/650  Phone Note From Pharmacy   [facility]  [address] [id number] Reason for Call: Needs renewal Details for Reason: Hydrocodone/acet 7.5/650 Summary of Call: last filled on 11/30/09 #100 Initial call taken by: Romualdo Bolk, CMA (AAMA),  January 03, 2010 9:43 AM    New/Updated Medications: HYDROCODONE-ACETAMINOPHEN 7.5-650 MG TABS (HYDROCODONE-ACETAMINOPHEN) 1 by mouth q 4 hours as needed for pain. Do not exceed 4 per day HYDROCODONE-ACETAMINOPHEN 7.5-500 MG TABS (HYDROCODONE-ACETAMINOPHEN) take one tab every 4 hours as needed pain.  no more than 4 tabs daily Prescriptions: HYDROCODONE-ACETAMINOPHEN 7.5-500 MG TABS (HYDROCODONE-ACETAMINOPHEN) take one tab every 4 hours as needed pain.  no more than 4 tabs daily  #120 x 5   Entered by:   Kern Reap CMA (AAMA)   Authorized by:   Judithann Sheen MD   Signed by:   Kern Reap CMA (AAMA) on 01/07/2010   Method used:   Telephoned to ...       [facility]  [address] [id number]       [address]       [place], [place]  [address]       Ph: [phone] or [phone]       Fax: [phone]   RxID:   [id number]  okay per dr Scotty Court

## 2010-02-13 NOTE — Assessment & Plan Note (Signed)
Summary: POOR APPETITE, WEAKNESS, CONSTIPATION           DEBORAH   History of Present Illness Visit Type: new patient  Primary GI MD: Lina Sar MD Primary Provider: Rickard Patience, MD  Requesting Provider: n/a Chief Complaint: Two years after radiation treatment patient started having constipation, weakness, and poor appetite History of Present Illness:   77 y.o. white male c/o  postprandial abdominal pain located in the epigastrium as well as in lower abdomen. It occurs within minutes after eating. His appetite has been decreased and he has lost about 11 lbs. Past history is significant for  stroke for which he he is on Plavix and aspirin. Upper endoscopy and colonoscopy in August 2003 showed  mild esophageal stricture which was dilated with 17 mm Savary  dilator. He had mild diverticulosis. He had a recent episode of  gross hematuria and underwent cauterization of the bleeding vessel in his bladder which resulted in urinary incontinence. He is having  problems with abdominal bloating . There is a history of Candida esophagitis and history of dilated common bile duct on  abdominal ultrasound in 2001   GI Review of Systems    Reports abdominal pain, bloating, and  loss of appetite.     Location of  Abdominal pain: generalized.    Denies acid reflux, belching, chest pain, dysphagia with liquids, dysphagia with solids, heartburn, nausea, vomiting, vomiting blood, weight loss, and  weight gain.      Reports change in bowel habits and  constipation.     Denies anal fissure, black tarry stools, diarrhea, diverticulosis, fecal incontinence, heme positive stool, hemorrhoids, irritable bowel syndrome, jaundice, light color stool, liver problems, rectal bleeding, and  rectal pain.    Current Medications (verified): 1)  Aspirin Ec 325 Mg Tbec (Aspirin) .... Take 1 Tablet Once A Day 2)  Neurontin 300 Mg Caps (Gabapentin) .... Take 1 Capsule By Mouth Three Times A Day 3)  Norvasc 10 Mg Tabs  (Amlodipine Besylate) .... Take 1 Tablet Once A Day 4)  Plavix 75 Mg Tabs (Clopidogrel Bisulfate) .[name] Kitchen.. 1 Qd 5)  Nabumetone 750 Mg Tabs (Nabumetone) .[name] Kitchen.. 1 Two Times A Day Pc For Arthritis 6)  Diovan 160 Mg Tabs (Valsartan) .[name] Kitchen.. 1 By Mouth Once Daily 7)  Prilosec 40 Mg Cpdr (Omeprazole) .[name] Kitchen.. 1 Once Daily For Gerd 8)  Avodart 0.5 Mg Caps (Dutasteride) .[name] Kitchen.. 1 Qd 9)  Vitamin D [address] Units .... One  Gel Weekly For 12 Weeks 10)  Clonidine Hcl 0.2 Mg Tabs (Clonidine Hcl) .[name] Kitchen.. 1 By Mouth Two Times A Day 11)  Stool Softener Laxative 8.6-50 Mg Tabs (Sennosides-Docusate Sodium) .... As Needed  Allergies (verified): 1)  ! Percocet 2)  ! Codeine  Past History:  Past Medical History: Stroke, right thalamic 8-01 per Dr. Anne Hahn Prostate cancer, hx of, per Dr. Vonita Moss Benign distal esophageal stricture Gastritis Grade II esophagitis Diverticulosis Hemorrhoids GERD Allergic rhinitis  Past Surgical History: Reviewed history from 11/05/2006 and no changes required. cervical spine surgery 8-07 per Dr. Channing Mutters lumbar spine surgery Appendectomy Cataract extraction radioactive seeds in prostate  Family History: Reviewed history from 04/18/2009 and no changes required. No FH of Colon Cancer:  Social History: Married Retired Patient is a former smoker.  Alcohol Use - no Smokeless Tobacco: Chewing Tobacco   Review of Systems       The patient complains of blood in urine, fatigue, itching, swelling of feet/legs, urination - excessive, and urine leakage.  The patient denies allergy/sinus, anemia, anxiety-new,  arthritis/joint pain, back pain, breast changes/lumps, change in vision, confusion, cough, coughing up blood, depression-new, fainting, fever, headaches-new, hearing problems, heart murmur, heart rhythm changes, muscle pains/cramps, night sweats, nosebleeds, shortness of breath, skin rash, sleeping problems, sore throat, swollen lymph glands, thirst - excessive, urination changes/pain, vision  changes, and voice change.         Pertinent positive and negative review of systems were noted in the above HPI. All other ROS was otherwise negative.   Vital Signs:  Patient profile:   77 year old male Height:      68 inches Weight:      177 pounds BMI:     27.01 BSA:     1.94 Pulse rate:   60 / minute Pulse rhythm:   regular BP sitting:   138 / 60  (left arm) Cuff size:   regular  Vitals Entered By: Ok Anis CMA (April 19, 2009 2:24 PM)   Impression & Recommendations:  Problem # 1:  CONSTIPATION (ICD-564.00) abdominal pain, weight loss and constipation. This could be related to decreased activity and decreased eating. We will start MiraLax 17 g daily and schedule for colonoscopy, r/o intestinal angina, r/o low grade SBO  Problem # 2:  ANEMIA (ICD-285.9) Assessment: Unchanged he is Hemoccult-negative today. Consider  upper GI souce of bleedingg due to gastric ulcer of peptic ulcer disease. Rule out gastric outlet obstruction or  malignancy.  Other Orders: Colon/Endo (Colon/Endo) TLB-CBC Platelet - w/Differential (85025-CBCD) TLB-CMP (Comprehensive Metabolic Pnl) (80053-COMP)  Patient Instructions: 1)  upper endoscopy 2)  Colonoscopy 3)  Increase Prilosec to 40 mg p.o. b.i.d. 4)  MiraLax 9 g every day in glass of water 5)  CBC, metabolic panel 6)  Copy sent to : DR W.Stafford 7)  The medication list was reviewed and reconciled.  All changed / newly prescribed medications were explained.  A complete medication list was provided to the patient / caregiver. Prescriptions: MIRALAX  POWD (POLYETHYLENE GLYCOL 3350) mix 9 grams into 8 oz of water once daily  #270g x 11   Entered by:   Christie Nottingham CMA (AAMA)   Authorized by:   Hart Carwin MD   Signed by:   Meryl Dare MD FACG on 04/19/2009   Method used:   Electronically to        [id number]       [address]       [place], [place]  [address]       Ph: [phone]  or [phone]       Fax: [phone]   RxID:   [id number] PRILOSEC 40 MG CPDR (OMEPRAZOLE) one tablet by mouth two times a day  #60 x 11   Entered by:   Christie Nottingham CMA (AAMA)   Authorized by:   Hart Carwin MD   Signed by:   Meryl Dare MD FACG on 04/19/2009   Method used:   [facility]ronically to        [id number]       [address]       [place], [place]  [address]       Ph: [phone] or [phone]       Fax: [phone]   RxID:   [id number] REGLAN 10 MG  TABS (METOCLOPRAMIDE HCL) As per prep instructions.  #2 x 0   Entered by:  Christie Nottingham CMA (AAMA)   Authorized by:   Hart Carwin MD   Signed by:   Meryl Dare MD FACG on 04/19/2009   Method used:   [facility]ically to        [facility] [id number]       [address]       [place], [place]  [address]       Ph: [phone] or [phone]       Fax: [phone]   RxID:   [id number] DULCOLAX 5 MG  TBEC (BISACODYL) Day before procedure take 2 at 3pm and 2 at 8pm.  #4 x 0   Entered by:   Christie Nottingham CMA (AAMA)   Authorized by:   Hart Carwin MD   Signed by:   Meryl Dare MD FACG on 04/19/2009   Method used:   [facility]ically to        [id number]       [address]       [place], [place]  [address]       Ph: [phone] or [phone]       Fax: [phone]   RxID:   [id number] MIRALAX   POWD (POLYETHYLENE GLYCOL 3350) As per prep  instructions.  #255gm x 0   Entered by:   Christie Nottingham CMA (AAMA)   Authorized by:   Hart Carwin MD   Signed by:   Meryl Dare MD FACG on 04/19/2009   Method used:   [facility] to        [facility]  [id number]       [address]       [place], [place]  [address]       Ph: [phone] or [phone]       Fax: [phone]   RxID:   [id number]   Appended Document: POOR APPETITE,  WEAKNESS, CONSTIPATION           DEBORAH Append Physical Exam:  alert. oriented, cooperative Eyes:non icteric Mouth: no ulcers, nl mucosa Neck:thyroid not enlarged, no adenopathy Lungs: clear, no wheezes or rales COR:RR,S1,S2 Abd: soft, nontender,, no palpable mass or stool Extr;No edema Skin :no lesions Psych : alert, cooperative  Appended Document: POOR APPETITE, WEAKNESS, CONSTIPATION           DEBORAH reviewed

## 2010-02-13 NOTE — Letter (Signed)
Summary: Appt Reminder 2  [facility]  [address] [place], [place] [address]   Phone: [phone]  Fax: [phone]        May 01, 2009 MRN: [id number]    MAKOA SATZ [address] [place], [place]  [address]    Dear Mr. LUCKEY,   You have a return appointment with Dr.Dora Juanda Chance on 05-27-09 at 1:45pm. Please remember to bring a complete list of the medicines you are taking, your insurance card and your co-pay.  If you have to cancel or reschedule this appointment, please call before 5:00 pm the evening before to avoid a cancellation fee.  If you have any questions or concerns, please call [phone].    Sincerely,    Laureen Ochs LPN  Appended Document: Appt Reminder 2 Letter mailed to patient.

## 2010-02-14 ENCOUNTER — Telehealth: Payer: Self-pay | Admitting: Internal Medicine

## 2010-02-15 ENCOUNTER — Telehealth: Payer: Self-pay | Admitting: Physician Assistant

## 2010-02-17 ENCOUNTER — Other Ambulatory Visit: Payer: BC Managed Care – PPO

## 2010-02-17 NOTE — Discharge Summary (Addendum)
NAMEALTIN, SEASE NO.:  [id number]  MEDICAL RECORD NO.:  [id number]          PATIENT TYPE:  INP  LOCATION:  3310                         FACILITY:  [facility]  PHYSICIAN:  Larina Earthly, M.D.    DATE OF BIRTH:  10/21/1929  DATE OF ADMISSION:  02/10/2010 DATE OF DISCHARGE:  02/11/2010                              DISCHARGE SUMMARY   CHIEF COMPLAINT:  Critical right carotid stenosis with history of remote stroke, right hemispheric stroke in 2001.  HISTORY OF PRESENT ILLNESS:  Mr. Huezo is a 77 year old gentleman who was seen for evaluation for extracranial cerebrovascular occlusive disease.  He was evaluated by Dr. Tenny Craw and found to have a carotid bruit and then underwent a CT angiogram in December 2011.  In 2001, he had a severe stroke with left-sided symptoms and states he still drags his left leg if he is fatigued, but otherwise has returned to his baseline from the stroke.  He had been hospitalized for a significant period of time with the 2001 event.  The CT angio done in December 2011 showed subtotal occlusion just distal to the carotid bifurcation, the right internal carotid artery with no significant stenosis on the last.  He has a small string sign on the right carotid artery all the way to the skull as well.  It was recommended that we proceed with a formal cerebral arteriogram for further evaluation which he had done on January 16, 2010, which confirmed the findings and felt the patient should be admitted for a right carotid endarterectomy.  PAST MEDICAL HISTORY: 1. Hypertension. 2. Prostate cancer. 3. Distal esophageal stricture requiring dilatation. 4. Gastritis. 5. History of diverticulitis. 6. Hemorrhoids. 7. Reflux disease and allergic rhinitis.  PAST SURGICAL HISTORY:  Significant for cervical spine surgery, lumbar surgery, appendectomy, cataracts, and radioactive seeding for his prostate cancer.  HOSPITAL COURSE:  The patient was taken  to the operating room on February 10, 2010, for a right carotid endarterectomy with Dacron patch angioplasty.  Postoperatively, the patient did well.  He had some headache, which were relieved by pain medication.  He had some slight dizziness as well, which resolved over the first postoperative day. Hemoglobin, hematocrit, and electrolytes were all stable.  He is ambulating, voiding, and taking p.o. without difficulty.  Dilaudid pain medication helped significantly with his pain in his neck and headache, and he was discharged to home.  FINAL DIAGNOSES:  Critical right carotid stenosis with a history of an old stroke in 2001 on the right side.  No residual of his left-sided weakness.  He has hypertension and other medical issues, were stable and controlled with his home medications while he was in the hospital.  DISPOSITION:  The patient was discharged to home.  He will follow up with Dr. Arbie Cookey in 2 weeks.  He will make an appoint with Dr. Tenny Craw in 1-2 weeks to evaluate his blood pressure medications.  DISCHARGE MEDICATIONS: 1. Dilaudid 2 mg 1 tablet every 4 hours as needed for pain. 2. Neurontin 300 mg twice daily. 3. Catapres 0.2 mg t.i.d. 4. Aspirin 325 mg daily. 5. Lasix 20 mg  daily. 6. Plavix 75 mg daily. 7. Norvasc 10 mg daily. 8. Prilosec 40 mg daily. 9. Tylenol Extra Strength q.8 h. for headache or pain. 10.Ventolin inhaler q.4 h. as needed for shortness of breath or     wheezing.     Della Goo, PA-C   ______________________________ Larina Earthly, M.D.    RR/MEDQ  D:  02/11/2010  T:  02/12/2010  Job:  [id number]  Electronically Signed by Della Goo PA on 02/17/2010 10:18:21 AM Electronically Signed by Obi Scrima M.D. on 02/19/2010 11:22:12 AM

## 2010-02-18 ENCOUNTER — Telehealth: Payer: Self-pay | Admitting: Internal Medicine

## 2010-02-19 NOTE — Progress Notes (Addendum)
Summary: B/P running high   Phone Note Call from Patient Call back at [phone]   Caller: Spouse/ Joann Summary of Call: Pt B/P running high Dr.Early nurse told pt to call regarding the pt medication changed pt had carotid artery procedure on Monday Initial call taken by: Judie Grieve,  February 14, 2010 3:50 PM  Follow-up for Phone Call        [facility] Mylo Red RN 02/14/10--1645pm--pt's wife calling stating pt had endarterectomy on mon 02/10/10 by dr early and is now experencing elevated bp(197/58--207/? ) wife states dr Tenny Craw d/ced his diovan, and lasix--his only BP meds are norvasc and catapress and he cannot take hydralazine--wife states dr Bosie Helper nurse told her to call us--message passed to dr Bernell Haynie--nt Follow-up by: Ledon Snare, RN,  February 14, 2010 4:44 PM     Appended Document: B/P running high  Rec:  Diovan 80 mg and Lasix.  10 mg.  F/u BMET on Monday AM.  Appended Document: B/P running high  Called in [facility] to [facility] [address]  Appended Document: B/P running high  Scheduled for bmet in EPIC system for 02/17/2010.

## 2010-02-19 NOTE — Progress Notes (Addendum)
  Phone Note Call from Patient   Summary of Call: pt's wife called re: BP meds. Diovan had recently been place on hold, then reinstated yesterday. However, it was called in to pharmacy at 80 mg, rather than previous dose of 160 mg daily.  I informed the pt's wife that I would correct this, by calling the pharmacy directly. she was appreciative of this plan. Initial call taken by: Nelida Meuse, PA-C,  February 15, 2010 3:06 PM

## 2010-02-19 NOTE — Op Note (Addendum)
[name], [name]:  [id number]  MEDICAL RECORD NO.:  [id number]          PATIENT TYPE:  INP  LOCATION:  3310                         FACILITY:  [facility]  PHYSICIAN:  Larina Earthly, M.D.    DATE OF BIRTH:  1929-02-05  DATE OF PROCEDURE:  02/10/2010 DATE OF DISCHARGE:                              OPERATIVE REPORT   PREOPERATIVE DIAGNOSIS:  Severe asymptomatic right internal carotid artery stenosis.  POSTOPERATIVE DIAGNOSIS:  Severe asymptomatic right internal carotid artery stenosis.  PROCEDURE:  Right carotid endarterectomy and Dacron patch angioplasty.  SURGEON:  Larina Earthly, MD  ASSISTANT:  Della Goo, PA-C  ANESTHESIA:  General endotracheal.  COMPLICATIONS:  None.  DISPOSITION:  To recovery room neurologically intact.  PROCEDURE IN DETAIL:  The patient was taken to the operating room, placed in a supine position where the right neck was prepped and draped in usual sterile fashion.  An incision was made anterior sternocleidomastoid and carried down through the platysma with electrocautery.  The sternocleidomastoid reflected posteriorly and the carotid sheath was opened.  The facial vein was ligated with 2-0 silk ties and divided.  The vagus and hypoglossal nerves were identified and preserved.  The common carotid artery was encircled with an umbilical tape and Rumel tourniquet.  Dissection was carried onto the bifurcation. The external carotid was circled with a blue vessel loop.  The internal carotid circled an umbilical tape and Rumel tourniquet.  The patient had a low bifurcation, did have extensive plaque in the internal carotid and this will be confirmed with preoperative angiography.  The patient has had total occlusion with a hypoperfused internal carotid artery and the internal carotid artery was actually of good caliber.  The patient was given 8000 units of intravenous heparin, had adequate circulation time. The internal,  external, and common carotid arteries were occluded.  The common carotid artery was opened with a 11-blade, sewn longitudinally with Potts scissors.  A 10-shunt was passed at the internal carotid allowed to back bleed and then down the common carotid artery was secured with Rumel tourniquets.  The endarterectomy was began on the common carotid artery and the plaques were divided proximally.  The external carotid was endarterectomized in an eversion technique and the internal carotid was endarterectomized in an open fashion.  The remaining atheromatous debris was removed from the endarterectomy plane. Finesse Hemashield Dacron patch was brought into the field and was sewn as a patch angioplasty with a running 6-0 Prolene suture.  Prior to completion of the anastomosis, the shunt was removed and the usual flushing maneuvers were undertaken.  The anastomosis was completed and flow was restored first to the internal and then the external carotid artery.  Excellent flow characteristics were noted with handheld Doppler in the internal and external carotid artery.  The patient was given 50 mg of protamine to reverse the heparin.  Wounds were irrigated with saline.  Hemostasis with electrocautery.  Wounds were closed with several 3-0 Vicryl sutures to reapproximate the sternocleidomastoid with the carotid sheath.  Next, the platysma was closed with a running 3-0 Vicryl sutures, finally the skin was  closed with 4-0 subcuticular Vicryl stitch.  Sterile dressing was applied.  The patient was awakened in the operating room neurologically intact and was transferred to the recovery room in a stable condition.     Larina Earthly, M.D.     TFE/MEDQ  D:  02/10/2010  T:  02/10/2010  Job:  [id number]  Electronically Signed by Keela Rubert M.D. on 02/19/2010 11:22:21 AM

## 2010-02-19 NOTE — Letter (Signed)
Summary: [facility] Office Visit   [facility] Office Visit   Imported By: Roderic Ovens 02/13/2010 12:23:33  _____________________________________________________________________  External Attachment:    Type:   Image     Comment:   External Document

## 2010-02-25 ENCOUNTER — Ambulatory Visit: Payer: Self-pay | Admitting: Vascular Surgery

## 2010-02-25 ENCOUNTER — Ambulatory Visit (INDEPENDENT_AMBULATORY_CARE_PROVIDER_SITE_OTHER): Payer: MEDICARE | Admitting: Vascular Surgery

## 2010-02-25 ENCOUNTER — Other Ambulatory Visit: Payer: Self-pay | Admitting: Internal Medicine

## 2010-02-25 ENCOUNTER — Encounter: Payer: Self-pay | Admitting: Internal Medicine

## 2010-02-25 ENCOUNTER — Other Ambulatory Visit (INDEPENDENT_AMBULATORY_CARE_PROVIDER_SITE_OTHER): Payer: Medicare Other

## 2010-02-25 DIAGNOSIS — I6529 Occlusion and stenosis of unspecified carotid artery: Secondary | ICD-10-CM

## 2010-02-25 DIAGNOSIS — I10 Essential (primary) hypertension: Secondary | ICD-10-CM

## 2010-02-25 LAB — BASIC METABOLIC PANEL
CO2: 26 mEq/L (ref 19–32)
Chloride: 109 mEq/L (ref 96–112)
Glucose, Bld: 102 mg/dL — ABNORMAL HIGH (ref 70–99)
Potassium: 5.4 mEq/L — ABNORMAL HIGH (ref 3.5–5.1)
Sodium: 141 mEq/L (ref 135–145)

## 2010-02-25 NOTE — Assessment & Plan Note (Signed)
OFFICE VISIT  [name], [name] DOB:  04/28/1929                                       02/25/2010 CHART#:[id number]  The patient presents today for followup after his right carotid endarterectomy and Dacron patch angioplasty on 02/10/2010.  He did well and was discharged home on postop day #1.  He continues to do well following this.  He has had no neurologic deficits, does have the usual amount of soreness at his incision.  His neck has healed nicely without bruits.  He will continue his usual activities without limitation.  I plan to see him again in 6 months for repeat carotid duplex.  He will let us know if he has any wound difficulties or neurologic deficits.    Larina Earthly, M.D.  TFE/MEDQ  D:  02/25/2010  T:  02/25/2010  Job:  [id number]  cc:   Ellin Saba., MD

## 2010-03-03 ENCOUNTER — Encounter: Payer: Self-pay | Admitting: Internal Medicine

## 2010-03-04 ENCOUNTER — Encounter: Payer: Self-pay | Admitting: Internal Medicine

## 2010-03-05 NOTE — Progress Notes (Signed)
Summary: pt has medication question  Phone Note Call from Patient Call back at Home Phone [phone] Call back at [phone]   Caller: Angelia Mould Reason for Call: Talk to Nurse, Talk to Doctor Summary of Call: pt needs to know how to take medications and when pt needs to get labs done they are very confused Initial call taken by: Omer Jack,  February 18, 2010 1:51 PM  Follow-up for Phone Call        [facility] that non fasting labs are scheduled for 2/14 and to call back if she has any other questions. Layne Benton, RN, BSN  February 21, 2010 2:07 PM

## 2010-03-10 ENCOUNTER — Other Ambulatory Visit: Payer: Self-pay | Admitting: Family Medicine

## 2010-03-11 NOTE — Miscellaneous (Signed)
  Clinical Lists Changes  Medications: Added new medication of HYDROCHLOROTHIAZIDE 25 MG TABS (HYDROCHLOROTHIAZIDE) one half a tab every day

## 2010-03-11 NOTE — Miscellaneous (Addendum)
  Clinical Lists Changes  Medications: Changed medication from DIOVAN 160 MG TABS (VALSARTAN) 1 tab every day to DIOVAN 160 MG TABS (VALSARTAN) one half tab every day

## 2010-03-11 NOTE — Miscellaneous (Signed)
  Clinical Lists Changes  Medications: Added new medication of DIOVAN 160 MG TABS (VALSARTAN) 1 tab every day - Signed  Appended Document:  Needs to cut back to 80 mg per day.  K runs high.  Appended Document:  Called patient and he is aware to cut Diovan in half to 80mg  per day.

## 2010-03-18 ENCOUNTER — Telehealth: Payer: Self-pay | Admitting: Internal Medicine

## 2010-03-25 NOTE — Progress Notes (Signed)
Summary: pt wife has question re meds  Phone Note Call from Patient Call back at Home Phone [phone] Call back at cell [phone]   Caller: Spouse Reason for Call: Talk to Nurse Summary of Call: pt wife [name] has question re pt blood pressure meds.  Initial call taken by: Roe Coombs,  March 18, 2010 12:47 PM  Follow-up for Phone Call        03/18/10--1300pm--pt's wife calling stating pt not doing well with present med regime, due to fact pt had prostrate problems in past with surgery--he is taking HCTZ and lasix and evidently leaking urine and wearing depends all the time--pt's wife states pt does not tell dr Tenny Craw about these problems so nothing is done--pt and wife coming in to see dr Tenny Craw the 16th of this month for an appoint and i stated i would let dr Tenny Craw know about these problems before o.v.--wife agrees and will come with pt to dicuss problems Follow-up by: Ledon Snare, RN,  March 18, 2010 1:05 PM

## 2010-03-28 ENCOUNTER — Encounter: Payer: Self-pay | Admitting: Internal Medicine

## 2010-03-28 ENCOUNTER — Ambulatory Visit (INDEPENDENT_AMBULATORY_CARE_PROVIDER_SITE_OTHER): Payer: Medicare Other | Admitting: Internal Medicine

## 2010-03-28 DIAGNOSIS — I10 Essential (primary) hypertension: Secondary | ICD-10-CM

## 2010-03-28 DIAGNOSIS — I119 Hypertensive heart disease without heart failure: Secondary | ICD-10-CM

## 2010-03-28 DIAGNOSIS — E785 Hyperlipidemia, unspecified: Secondary | ICD-10-CM

## 2010-04-01 LAB — CONVERTED CEMR LAB
BUN: 30 mg/dL — ABNORMAL HIGH (ref 6–23)
CO2: 21 meq/L (ref 19–32)
Chloride: 106 meq/L (ref 96–112)
Creatinine, Ser: 1.13 mg/dL (ref 0.40–1.50)
Glucose, Bld: 90 mg/dL (ref 70–99)
HDL: 34 mg/dL — ABNORMAL LOW (ref >39)
LDL Cholesterol: 73 mg/dL (ref 0–99)

## 2010-04-10 NOTE — Assessment & Plan Note (Signed)
Summary: 4 mo rov/sl/ac   Referring Provider:  n/a Primary Provider:  Rickard Patience, MD   CC:  4 month rov/.  History of Present Illness: [name] is a 77 year old wiht a history of hypertension, CV disease.  I saw him earlier this winter.  Since seen he underwent CEA.  He has continue to have problmes with his BP.  His wife says that he is not tolearting the diuretics well because he urinates frequently and has to use Depends (has had problems with this and hematuria since prostate surg/XRT).  BP log shows that it is mostly in the 150 to 160 range.   Current Medications (verified): 1)  Aspirin Ec 325 Mg Tbec (Aspirin) .... Take 1 Tablet Once A Day 2)  Neurontin 300 Mg Caps (Gabapentin) .... Take 1 Capsule By Mouth Three Times A Day 3)  Norvasc 10 Mg Tabs (Amlodipine Besylate) .... Take 1 Tablet Once A Day 4)  Plavix 75 Mg Tabs (Clopidogrel Bisulfate) .[name] Kitchen.. 1 Qd 5)  Prilosec 40 Mg Cpdr (Omeprazole) .... One Tablet By Mouth Two Times A Day As Needed 6)  Clonidine Hcl 0.2 Mg Tabs (Clonidine Hcl) .[name] Kitchen.. 1 By Mouth Three Times A Day 7)  Stool Softener Laxative 8.6-50 Mg Tabs (Sennosides-Docusate Sodium) .... As Needed 8)  Miralax  Powd (Polyethylene Glycol 3350) .... Mix 9 Grams Into 8 Oz of Water Once Daily 9)  Lasix 20 Mg Tabs (Furosemide) .... One Half Tablet Daily 10)  Hydrocodone-Acetaminophen 7.5-500 Mg Tabs (Hydrocodone-Acetaminophen) .... Take One Tab Every 4 Hours As Needed Pain.  No More Than 4 Tabs Daily 11)  Diovan 160 Mg Tabs (Valsartan) .... One Tablet Once Daily 12)  Hydrochlorothiazide 25 Mg Tabs (Hydrochlorothiazide) .... One Half A Tab Every Day  Allergies (verified): 1)  ! Percocet 2)  ! Codeine  Past History:  Past medical, surgical, family and social histories (including risk factors) reviewed, and no changes noted (except as noted below).  Past Medical History: Reviewed history from 10/03/2009 and no changes required. Hypertension Stroke, right thalamic 8-01 per  Dr. Anne Hahn Prostate cancer, hx of, per Dr. Vonita Moss Benign distal esophageal stricture Gastritis Grade II esophagitis Diverticulosis Hemorrhoids GERD Allergic rhinitis  Past Surgical History: Reviewed history from 10/03/2009 and no changes required. cervical spine surgery 8-07 per Dr. Channing Mutters lumbar spine surgery Appendectomy Cataract extraction radioactive seeds in prostate  Family History: Reviewed history from 10/03/2009 and no changes required.  Mother died of, "old age".  Father died with black lung disease.  Five to six brothers and sisters - one brother died with MI and onewith cancer.  Social History: Reviewed history from 10/03/2009 and no changes required. Married Retired [name] is a former smoker.  Alcohol Use - no Smokeless Tobacco: Chewing Tobacco   Review of Systems       All systems reviewed.  Neg to the above problem except as noted above.  Vital Signs:  [name] profile:   77 year old male Height:      68 inches Weight:      175 pounds BMI:     26.70 Pulse rate:   53 / minute Pulse rhythm:   regular BP sitting:   154 / 66  (left arm) Cuff size:   regular  Vitals Entered By: Judithe Modest CMA (March 28, 2010 2:32 PM)  Physical Exam  Additional Exam:  [name] is in NAD HEENT:  Normocephalic, atraumatic. EOMI, PERRLA.  Neck: JVP is normal. No thyromegaly. R CEA scar healing well. Lungs: clear  to auscultation. No rales no wheezes.  Heart: Regular rate and rhythm. Normal S1, S2. No S3.   No significant murmurs. PMI not displaced.  Abdomen:  Supple, nontender. Normal bowel sounds. No masses. No hepatomegaly.  Extremities:   Good distal pulses throughout. No lower extremity edema.  Musculoskeletal :moving all extremities.  Neuro:   alert and oriented x3.    EKG  Procedure date:  03/28/2010  Findings:      SInus bradycardia  53 bpm.  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  I would keep him on the same regimen and add Aldomet 500 two times a day.  He has been intoleratn to hydralazine.  HR limts other choices.  K has limited Rx with [facility] Check BMET today.  Problem # 2:  CAROTID BRUIT, LEFT (ICD-785.9) S?P R CEA WOuld check fasting lipids.  Other Orders: EKG w/ Interpretation (93000) T-Basic Metabolic Panel [id number]) T-Lipid Profile (95621-30865)  [name] Instructions: 1)  Your physician recommends that you return for lab work in: lab work today...we will call you with results 2)  Your physician has recommended you make the following change in your medication: STOP Lasix  3)  Your physician recommends that you schedule a follow-up appointment in: 1 month with Dr.Jawaun Celmer Prescriptions: METHYLDOPA 500 MG TABS (METHYLDOPA) 1 tab 2 times per day  #60 x 6   Entered by:   Layne Benton, RN, BSN   Authorized by:   Sherrill Raring, MD, [facility]   Signed by:   Layne Benton, RN, BSN on 03/28/2010   Method used:   Electronically to        [facility]  [address] [id number] ([facility])       [address]       [place], [place]  [address]       Ph: [phone] or [phone]       Fax: [phone]   RxID:   [id number]

## 2010-04-18 LAB — POCT I-STAT 4, (NA,K, GLUC, HGB,HCT)
Glucose, Bld: 114 mg/dL — ABNORMAL HIGH (ref 70–99)
HCT: 33 % — ABNORMAL LOW (ref 39.0–52.0)
Potassium: 5 mEq/L (ref 3.5–5.1)
Sodium: 144 mEq/L (ref 135–145)

## 2010-05-08 ENCOUNTER — Encounter: Payer: Self-pay | Admitting: Internal Medicine

## 2010-05-09 ENCOUNTER — Encounter: Payer: Self-pay | Admitting: Internal Medicine

## 2010-05-09 ENCOUNTER — Ambulatory Visit (INDEPENDENT_AMBULATORY_CARE_PROVIDER_SITE_OTHER): Payer: Medicare Other | Admitting: Internal Medicine

## 2010-05-09 VITALS — BP 130/62 | HR 64

## 2010-05-09 DIAGNOSIS — E785 Hyperlipidemia, unspecified: Secondary | ICD-10-CM

## 2010-05-09 DIAGNOSIS — I10 Essential (primary) hypertension: Secondary | ICD-10-CM

## 2010-05-09 NOTE — Patient Instructions (Signed)
Your physician recommends that you schedule a follow-up appointment in: 6 months with Dr. Ross  

## 2010-05-10 NOTE — Assessment & Plan Note (Signed)
No changes

## 2010-05-10 NOTE — Assessment & Plan Note (Signed)
BP is overall a little better.  He has been difficult to control.  Has had SE from meds (swelling, increased urination). I would keep him on the same regimen.  He is to call if his BP runs in the 150s.  I told him to hold diovan for now.  Can readd if bp is high.

## 2010-05-10 NOTE — Progress Notes (Signed)
HPI Patient is a 77 year old wiht a history of hypertension, CV disease.  I saw him earlier this winter.  Since seen he underwent CEA.  He has been seen most recently for HTN which has been difficult to control. When I saw him in March I put him on Methyldopa.   Since seen he says he has been feeling pretty good.  Has noted his HR has been slow at times (40s) but denies dizziness.  No chest pains.  He ran out of Diovan about 1 wk ago. BP logs from home show BPs range from 130s to 150s. Allergies  Allergen Reactions  . Codeine   . Oxycodone-Acetaminophen     Current Outpatient Prescriptions  Medication Sig Dispense Refill  . amLODipine (NORVASC) 10 MG tablet take 1 tablet by mouth once daily  30 tablet  2  . aspirin 325 MG tablet Take 325 mg by mouth daily.        . cloNIDine (CATAPRES) 0.2 MG tablet Take 0.2 mg by mouth 3 (three) times daily.        . clopidogrel (PLAVIX) 75 MG tablet Take 75 mg by mouth daily.        [name] Kitchen gabapentin (NEURONTIN) 300 MG capsule Take 300 mg by mouth 3 (three) times daily.        . methyldopa (ALDOMET) 500 MG tablet Take 500 mg by mouth 2 (two) times daily.        [name] Kitchen omeprazole (PRILOSEC) 40 MG capsule Take 40 mg by mouth as needed.       . polyethylene glycol (MIRALAX / GLYCOLAX) packet Take 17 g by mouth daily.        [name] Kitchen senna-docusate (SENOKOT-S) 8.6-50 MG per tablet Take 1 tablet by mouth as needed.        . valsartan (DIOVAN) 160 MG tablet Take 160 mg by mouth daily.          Past Medical History  Diagnosis Date  . HTN (hypertension)   . 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 History of prostate cancer   . Esophageal stricture     benigh distal  . Gastritis   . Esophagitis     grade 2  . Diverticulosis   . GERD (gastroesophageal reflux disease)   . Hemorrhoids   . Allergic rhinitis     Past Surgical History  Procedure Date  . Back surgery 8/07    cervical spine  . Back surgery     lumbar  . Appendectomy   . Cataract extraction   . Insertion  prostate radiation seed     Family History  Problem Relation Age of Onset  . Lung disease    . Heart attack    . Cancer      History   Social History  . Marital Status: Married    Spouse Name: N/A    Number of Children: N/A  . Years of Education: N/A   Occupational History  . retired    Social History Main Topics  . Smoking status: Former Games developer  . Smokeless tobacco: Current User    Types: Chew  . Alcohol Use: No  . Drug Use: Not on file  . Sexually Active: Not on file   Other Topics Concern  . Not on file   Social History Narrative  . No narrative on file    Review of Systems:  All systems reviewed.  They are negative to the above problem except as previously stated.  Vital Signs: BP 130/62  Pulse 64  Physical Exam  HEENT:  Normocephalic, atraumatic. EOMI, PERRLA.  Neck: JVP is normal. No thyromegaly. No bruits.  Lungs: clear to auscultation. No rales no wheezes.  Heart: Regular rate and rhythm. Normal S1, S2. No S3.   No significant murmurs. PMI not displaced.  Abdomen:  Supple, nontender. Normal bowel sounds. No masses. No hepatomegaly.  Extremities:   Good distal pulses throughout. No lower extremity edema.  Musculoskeletal :moving all extremities.  Neuro:   alert and oriented x3.  CN II-XII grossly intact.  EKG:  Sinus bradycardia.  54 bpm.  LAFB.  ANterior MI.   Assessment and Plan:

## 2010-05-26 ENCOUNTER — Other Ambulatory Visit: Payer: Self-pay | Admitting: Internal Medicine

## 2010-05-27 NOTE — H&P (Signed)
HISTORY AND PHYSICAL EXAMINATION   January 28, 2010   Re:  [name], [name]                 DOB:  1929/12/06   The patient presents today for continued discussion of his  cerebrovascular occlusive disease.  I had seen him with an extensive  consultation on 12/.20/2011.  This showed a CT angiogram suggesting  severe stenosis or occlusion of his right internal carotid artery.  It  was impossible to determine if he had adequate the internal carotid  artery distal to this for endarterectomy.  He essentially underwent  formal catheter angiogram with Dr. Fransisco Hertz on 01/16/2010 and I he  is here today for discussion of this.  This does show a critical  stenosis in his right internal carotid artery.  He does have a patent  artery past this with some diminished caliber due to hypoperfusion.  His  left carotid system is widely patent.  I have discussed options with  this patient and his wife present.  I do feel that he is at risk for  stroke related to his critical stenosis of his internal carotid and have  recommended endarterectomy for reduction of stroke risk.  I explained  the procedure to include 1% to 2% with stroke with surgery.  He  understands this and wishes to proceed and has scheduled this on 01/30  at [facility].   PAST MEDICAL HISTORY:  Significant for hypertension, prostate cancer,  distal esophageal stricture requiring dilatation, gastritis, history of  diverticulosis, hemorrhoids, gastroesophageal reflux disease and  allergic rhinitis.  He does have a history of prior cervical spine  surgery and lumbar surgery, appendectomy, cataract extraction,  radioactive seeds for his  prostate.   FAMILY HISTORY:  Negative for premature atherosclerotic disease.   SOCIAL HISTORY:  He is retired, married with 1 child.  He quit smoking  20 years and does not drink alcohol.   REVIEW OF SYSTEMS:  Shortness of breath with exertion and no weight loss  or gain.   He weighs 172 pounds.  He is 5 feet 11 inches tall.  Review of  systems is otherwise negative except for HPI.   PHYSICAL EXAMINATION:  Well developed, well nourished white male in no  acute distress.  Blood pressures 200/78.  He reports this typically runs  160. He checks it on a daily basis.  Heart rate 76, respirations 18.  HEENT:  Normal.  I do not appreciate carotid bruits bilaterally.  Heart:  Regular rate and rhythm.  Chest:  Clear bilaterally.  Abdomen:  Soft,  nontender.  No masses noted.  Musculoskeletal:  Shows no major  deformities.  Neurologic:  No focal weakness, paresthesias.  Skin:  Without ulcers or rashes.   IMPRESSION:  Severe  right internal carotid artery stenosis.   PLAN:  The patient will be admitted for right carotid endarterectomy and  Dacron patch angioplasty.     Larina Earthly, M.D.  Electronically Signed   TFE/MEDQ  D:  01/28/2010  T:  01/29/2010  Job:  [id number]

## 2010-05-27 NOTE — Op Note (Signed)
[name], [name]                 ACCOUNT NO.:  [id number]   MEDICAL RECORD NO.:  [id number]          PATIENT TYPE:  AMB   LOCATION:  [facility]                         FACILITY:  [facility]   PHYSICIAN:  Maretta Bees. 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   PROCEDURE:  Cystoscopy and fulguration of prostate and bladder.   ANESTHESIA:  General.   INDICATIONS:  This gentleman has had radioactive seed implantation of  the prostate for carcinoma.  He has developed gross hematuria with clots  over the last couple weeks.  He has been taken off his Plavix and  aspirin and also has been placed Avodart.  Despite that he has continued  having grossly bloody urine.  I cystoscoped him the office and there  appeared to be bleeding from the prostatic mucosa.  With his history of  ischemic stroke it was felt best to hold off on Amicar and opted for  cystoscopy and fulguration of the prostate today.   PROCEDURE:  The patient brought to the operating room and placed  lithotomy position.  External genitalia were prepped and draped in usual  fashion.  He was cystoscoped and the anterior urethra was normal.  The  prostatic urethra had some shaggy tissue on the prostatic floor and on  the right side of the prostate there was a large adherent blood clot.  The bladder was totally unremarkable except one tiny area of increased  vascularity with no definitive bleeding at the present time.  Combined  with his renal ultrasound it certainly seems that the bleeding has been  from his prostatic urethra.  I then fulgurated the vascular areas of  bladder just as a precaution, but then I fulgurated the right side of  the prostate after I dislodged the adherent clot.  I also  fulgurated  some shaggy-looking tissue on the prostatic floor.  During the course of  the procedure he never had any real obviously active bleeding and his  irrigation fluid remained clear throughout the procedure.  At the end of  the case there was excellent hemostasis and no sign of any active  bleeding.  The scope was removed.  The patient sent to recovery room in  good condition.      Maretta Bees. Vonita Moss, M.D.  Electronically Signed     LJP/MEDQ  D:  09/12/2008  T:  09/12/2008  Job:  [id number]

## 2010-05-27 NOTE — Op Note (Signed)
[name], [name]                 ACCOUNT NO.:  [id number]   MEDICAL RECORD NO.:  [id number]          PATIENT TYPE:  AMB   LOCATION:  [facility]                         FACILITY:  [facility]   PHYSICIAN:  Maretta Bees. Vonita Moss, M.D.DATE OF BIRTH:  Jul 26, 1929   DATE OF PROCEDURE:  09/22/2006  DATE OF DISCHARGE:                               OPERATIVE REPORT   PREOPERATIVE DIAGNOSIS:  Prostatic carcinoma.   POSTOPERATIVE DIAGNOSIS:  Prostatic carcinoma.   PROCEDURE:  Radioactive seed implantation to prostate and cystoscopy.   SURGEON:  Dr. Larey Dresser.   ASSISTANT:  Dr. Margaretmary Dys.   ANESTHESIA:  General.   INDICATIONS:  This 77 year old gentleman was found to have an elevated  PSA and then was biopsied and found to have Gleason 7 prostatic  carcinoma on the right and Gleason 7 on the left.  The patient opted for  radioactive seed implantation.  He obtained surgical clearance and has  been off Plavix for week.   PROCEDURE:  The patient brought to the operating room and placed in  lithotomy position and rectal tube and transrectal ultrasound probe was  placed per rectum and Foley catheter was inserted per penis after  prepping and draping in the usual fashion.  He underwent treatment  planning and then he underwent therapy by placing 18 needles that were  activated in addition to the two anchoring needles that were used to  deliver total of 46 seeds with I-125 with 13.57 mCi total activity.  Under ultrasound control the seeds were placed in excellent position  documented by fluoroscopic photo at the end of the procedure.  At this  point to the transrectal ultrasound rectal tube were removed and Foley  was removed and he underwent cystoscopy, there was no evidence of injury  or seeds in the urethra or bladder.  He did have evidence of previous  prostatic resection.  Foley catheter was then inserted connected closed  drainage and the patient sent to recovery room in good condition  having  tolerated the procedure well.      Maretta Bees. Vonita Moss, M.D.  Electronically Signed     LJP/MEDQ  D:  09/22/2006  T:  09/23/2006  Job:  [id number]   cc:   [name]

## 2010-05-27 NOTE — Consult Note (Signed)
NEW PATIENT CONSULTATION   [name], [name]  DOB:  04-Dec-1929                                       12/31/2009  CHART#:[id number]   Patient is a 80-year gentleman seen for evaluation of extracranial  cerebrovascular occlusive disease.  He was evaluated by Dr. Dietrich Pates  and was found to have a carotid bruit and underwent further evaluation  to include a CT angiogram.  The date of this procedure was 12/26/2009.  I have reviewed these images and discussed them at length with patient  and his wife present.  He did have a prior history of a severe stroke in  2001.  He had significant right-sided weakness but still had some  clumsiness in his right hand and occasionally drags his left leg if he  is fatigued but otherwise has returned to his baseline.  He was  hospitalized for a significant period of time at that 2001 event.   His past history is significant for hypertension, prostate cancer  treated by Dr. Larey Dresser, distal esophageal stricture, gastritis,  history of diverticulosis, hemorrhoids, gastroesophageal reflux disease,  allergic rhinitis.   His past surgical history is significant for cervical spine surgery Dr.  Trey Sailors, lumbar surgery, appendectomy, cataract extraction, and  radioactive seeds for his prostate.   FAMILY HISTORY:  Negative for premature atherosclerotic disease.   He is retired from __________ [employer].  Married with 1 child.  He  quit smoking 20 years ago and does not drink alcohol.   REVIEW OF SYSTEMS:  No weight loss or gain.  He weighs 172 pounds.  He  is 5 feet 11 inches tall.  VASCULAR:  Positive for stroke.  CARDIAC:  Shortness of breath with exertion.  GI:  Reflux, trouble swallowing, constipation.  NEUROLOGIC:  Dizziness, headache.  PULMONARY:  Asthma and wheezing.  HEMATOLOGIC:  Negative.  URINARY:  Positive for prostate cancer and urinary frequency, otherwise  review of systems is negative.   PHYSICAL  EXAMINATION:  A well-developed and well-nourished white male  appearing his stated age in no acute distress.  Blood pressure is 194/67  in his left arm and 206/76 in his right arm.  Heart rate 60,  respirations 20.  HEENT:  Normal.  Chest:  Clear bilaterally without  rales, rhonchi or wheezes.  Heart:  Regular rate and rhythm.  Carotid  arteries:  I do not appreciate any bruits today.  He does have 2+ radial  and 2+ femoral pulses bilaterally.  Abdomen is soft and nontender.  No  masses noted.  Musculoskeletal shows no major deformity or cyanosis.  Neurologic:  No focal weakness or paresthesias.  Skin without ulcers or  rashes.   I did review his CT scan and discussed it with patient and his wife.  This shows a subtotal occlusion just distal to the carotid bifurcation  on the right internal carotid artery.  There is no significant stenosis  on the left.  He does have a small string sign throughout the internal  carotid all the way up into the skull.   I discussed the significance with them.  I have recommended that we  proceed with a formal cerebral arteriogram for further evaluation of his  distal internal carotid artery.  I feel that he may not be a candidate  for endarterectomy if he has a diminutive carotid past  the bifurcation.  This could simply be hypoperfused but does look like a very small  atretic artery.  I have explained that with this being totally  asymptomatic, I feel comfortable waiting until after the holidays, and  he has been scheduled for outpatient cerebral arteriogram by Dr. Ladona Mow at [facility] on 01/16/2010.  We will make further recommendations  pending this study.  Patient understands to notify us should he develop  any focal neurologic deficits.     Larina Earthly, M.D.  Electronically Signed   TFE/MEDQ  D:  12/31/2009  T:  12/31/2009  Job:  [id number]   cc:   Pricilla Riffle, MD, [facility]

## 2010-05-30 NOTE — Discharge Summary (Signed)
[place]  Patient:    [name], [name]                        MRN: [id number] Adm. Date:  [id number] Disc. Date: 09/02/99 Attending:  Faith Rogue T Dictator:   Mcarthur Rossetti. Angiulli, P.A. CC:         Marlan Palau, M.D.  Leroy Sea., M.D.   Discharge Summary  DISCHARGE DIAGNOSES: 1. Right cerebrovascular accident. 2. Benign prostatic hypertrophy. 3. History of tobacco and alcohol abuse.  HISTORY OF PRESENT ILLNESS:  A 77 year old male admitted August 22, 1999, with acute left-sided weakness, no chest pain or shortness of breath, no nausea or vomiting.  Upon evaluation, cranial CT scan on August 22, 1999 was negative. Followup scan showed a non-hemorrhagic lacunar infarction right thalamus, posterior limb of internal capsule.  Carotid duplex without internal carotid artery stenosis.  Neurology consult.  Received TPA and placed on aspirin/Plavix therapy.  Close supervision for ambulation with some motor planning difficulty.  Latest chemistries unremarkable.  Hemoglobin 15.0.  Admitted for comprehensive rehab program.  PAST MEDICAL HISTORY:  See discharge diagnoses.  PAST SURGICAL HISTORY: 1. Appendectomy. 2. Back surgery.  ALLERGIES:  TYLOX.  PRIMARY CARE PHYSICIAN:  Dr. Scotty Court.  MEDICATIONS PRIOR TO ADMISSION:  Darvocet and Prilosec.  SOCIAL HISTORY:  Lives with wife in [place].  Independent prior to admission.  Employed with [employer].  Wife is a Designer, jewellery, works three days a week.  Daughter also is an Charity fundraiser, former Engineer, civil (consulting) on rehabilitation unit who works days.  HOSPITAL COURSE:  The patient did well while in rehabilitation services with therapies initiated on a b.i.d. basis.  The following issues were followed during the patients rehab course.  Pertaining to Mr. Suitss right cerebrovascular accident, it remained stable.  Strength pressure grade at 4/5 and maintained on Plavix/aspirin therapy.   There was no swallowing difficulties.  His mobility continued to improve.  Day passes went well with family teaching completed.  He was voiding without difficulty with a history of benign prostatic hypertrophy.  He had a transurethral resection of prostate in the past.  He had no gastrointestinal complaints.  He had resumed his Prilosec as prior to hospital admission.  Overall, for his functional mobility he was modified independence for ambulation and activities of daily living. His mobility improved.  He was advised no driving, no smoking, no alcohol.  He was discharged to home.  LABORATORY AND ACCESSORY DATA:  Latest labs showed a sodium of 138, potassium 4.3, BUN 17.0, creatinine 0.8, hemoglobin 13.9, hematocrit 39.8.  DISCHARGE MEDICATIONS: 1. Prilosec daily as prior to hospital admission. 2. Ecotrin 325 mg daily. 3. Plavix 75 mg daily. 4. Neurontin 300 mg q.8h. 5. Darvocet as needed for back pain.  ACTIVITY:  As tolerated.  DIET:  Regular.  SPECIAL INSTRUCTIONS:  No driving, no smoking, no alcohol.  FOLLOWUP:  Dr. Scotty Court for medical management.  Dr. Lesia Sago neurology services.  Dr. Faith Rogue as needed rehab services. DD:  09/01/99 TD:  09/02/99 Job: [id number]

## 2010-05-30 NOTE — Discharge Summary (Signed)
[name], [name]:  [id number]   MEDICAL RECORD NO.:  [id number]          PATIENT TYPE:  INP   LOCATION:  3009                         FACILITY:  [facility]   PHYSICIAN:  Hilda Lias, M.D.   DATE OF BIRTH:  1929/11/26   DATE OF ADMISSION:  09/04/2003  DATE OF DISCHARGE:  09/09/2003                                 DISCHARGE SUMMARY   ADMISSION DIAGNOSIS:  Cervical spondylosis, C4-C5 and C5-C6.   FINAL DIAGNOSIS:  Cervical spondylosis, C4-C5 and C5-C6.   BRIEF HISTORY:  The patient is 77 years old and was admitted by Dr. Trey Sailors  because of spinal cord compression.  Because of the findings, the patient  was admitted for surgery by Dr. Channing Mutters.   LABORATORY DATA:  Normal.   HOSPITAL COURSE:  The patient was taken to surgery by Dr. Trey Sailors and  anterior C4-C5 and C5-C6 discectomy and fusion was done.  The patient did  well, was seen by physical therapy.  At the end, he felt that he was ready  to go home.  He was discharged to be seen by Dr. Channing Mutters in the office.   CONDITION ON DISCHARGE:  Improved.   DISCHARGE MEDICATIONS:  Vicodin for pain.  He is to continue taking his  medications for high blood pressure.   DISCHARGE INSTRUCTIONS:  Diet regular.  Activities:  Not to drive, not to do  any lifting.  Follow up with Dr. Channing Mutters, call for a follow up appointment.       EB/MEDQ  D:  10/31/2003  T:  10/31/2003  Job:  [id number]

## 2010-05-30 NOTE — Op Note (Signed)
[name], [name]:  [id number]   MEDICAL RECORD NO.:  [id number]                   PATIENT TYPE:  INP   LOCATION:  2899                                 FACILITY:  [facility]   PHYSICIAN:  Payton Doughty, M.D.                   DATE OF BIRTH:  1929/09/10   DATE OF PROCEDURE:  09/04/2003  DATE OF DISCHARGE:                                 OPERATIVE REPORT   PREOPERATIVE DIAGNOSIS:  Spondylosis C4-5 and C5-6.   POSTOPERATIVE DIAGNOSIS:  Spondylosis C4-5 and C5-6.   OPERATION PERFORMED:  C4-5 and C5-6 anterior cervical decompression and  fusion with reflex hybrid plate.   SURGEON:  Payton Doughty, M.D.   ANESTHESIA:  General endotracheal.   PREP:  Sterile Betadine prep and scrub with alcohol wipe.   NURSE ASSISTANT:  [place].   DOCTOR ASSISTANT:  Coletta Memos, M.D.   COMPLICATIONS:  None.   INDICATIONS FOR PROCEDURE:  The patient is a 77 year old gentleman with  severe cervical spondylosis, early myelopathy.   DESCRIPTION OF PROCEDURE:  The patient was taken to the operating room,  smoothly anesthetized, intubated, and placed prone on the operating table in  the halter head traction.  Following shave, prep and drape in the usual  sterile fashion the skin was incised from the midline to the medial border  of the sternocleidomastoid muscle at the level of the carotid tubercle.  The  platysma was identified, elevated, divided and undermined.  Sternocleidomastoid muscle was identified.  Medial dissection revealed the  carotid artery, retracted laterally to the left, trachea and esophagus,  retracted laterally to the right, exposing the bones of the anterior  cervical spine.  Marker was placed.  Intraoperative x-ray was obtained to  confirm correctness of the level.  Having confirmed correctness of the  level, the longus colli was taken down bilaterally and the Shadowline self-  retaining retractor placed transversely in a cephalocaudal direction.   The  diskectomy was carried out under gross observation.  The operating  microscope was then brought in and we used microdissection technique to  complete the diskectomy, dissect the posterior longitudinal ligament and  remove it, dissect the anterior epidural space and explore the neural  foramina bilaterally.  At C4-5 there was largely degenerative disease with  some disk bulging into the neural foramina.  At  C5-6 there was pronounced  osteophytic intrusion into the neural foramina bilaterally, slightly greater  on the left than on the right.  Following complete decompression of the  nerve roots and exploration of the neural foramina, the wound was irrigated  and hemostasis assured.  7 mm bone grafts were fashioned and patellar  allograft tapped into place.  Reflex hybrid plate of the appropriate length  was then selected and secured with two screws in C4, two in C5 and two in  C6.  The screws were 12 mm.  Intraoperative x-ray showed good placement of  bone grafts, plate and screws.  The wound was irrigated and hemostasis  assured.  The platysma was reapproximated with 3-0 Vicryl in interrupted  fashion, subcutaneous tissue reapproximated with 3-0 Vicryl in interrupted  fashion.  Skin was closed with 4-0 Vicryl  running subcuticular fashion.  Benzoin and Steri-Strips were placed and a  Jackson-Pratt drain had been placed into the prevertebral space and exited  by a separate incision.  The patient was placed in an Aspen collar and  returned to the recovery room in good condition.                                               Payton Doughty, M.D.    MWR/MEDQ  D:  09/04/2003  T:  09/04/2003  Job:  [id number]

## 2010-05-30 NOTE — Op Note (Signed)
[name], [name]                          ACCOUNT NO.:  [id number]   MEDICAL RECORD NO.:  [id number]                   PATIENT TYPE:  AMB   LOCATION:  ENDO                                 FACILITY:  [facility]   PHYSICIAN:  [name], M.D. [facility]            DATE OF BIRTH:  1929/09/13   DATE OF PROCEDURE:  DATE OF DISCHARGE:                                 OPERATIVE REPORT   PROCEDURE:  Upper endoscopy and colonoscopy.   INDICATIONS FOR PROCEDURE:  This 77 year old gentleman has history of benign  distal esophageal stricture.  Dilatation was done two years ago.  He has  been on aspirin and Plavix because of CVA.  He has recently developed  dysphagia to solids which occurs more and more often.  He has been on  Prilosec 20 mg a day but has been taking it only erratically.  He is  undergoing upper endoscopy and esophageal dilatation.  Colonoscopy is being  done for an aplastic screening.  He is complaining of abdominal bloating,  constipation and history of hemorrhoids.  He apparently had a history of  diverticulitis which was treated with antibiotics by Dr. Scotty Harmon.   ENDOSCOPE:  Olympus single channel video endoscope.   SEDATION:  Versed 5 mg IV, Demerol 50 mg IV.   FINDINGS:  Olympus single channel video endoscope passed under direct vision  through posterior pharynx into the esophagitis.  The patient was monitored  by pulse oximeter.  His oxygen saturations were normal.  Proximal and mid  esophageal mucosa was unremarkable.  There was a long linear erosion to the  distal esophagus. There was also short erosion at the GE junction which was  located at 40 cm from the incisors.  There was fibrous sling in the  esophagus at the GE junction which was consistent with mild esophageal  stricture.  Diameter of the stricture was about 13 mm.   Stomach:  The stomach was insufflated with air and showed multiple erosions  and coffee grounds in the gastric antrum.  Biopsies were taken for  CLOtest.  Retroflexion of the endoscope revealed normal fundus and cardia.   Duodenum:  The duodenal bulb and descending duodenum were normal.  Guidewire  was then placed into the stomach and several dilators passed blindly without  fluoroscopic guidance through the esophagus using 15, 16, and 17 mm  dilators.  There was no blood on the dilator.  The patient tolerated the  procedure well.   IMPRESSION:  1. Benign distal esophageal stricture, status post dilatation to [address] [place].  2. Gastritis, status post CLOtest.  3. Grade II esophagitis.   PLAN:  1. Prilosec 20 mg a day.  The patient is to take it on a daily basis.  2. Antireflux measures.  3. Await results of the CLOtest.   PROCEDURE:  Colonoscopy.   ENDOSCOPE:  Olympus single channel videoscope.   SEDATION:  Additional Versed 3 mg  IV, Demerol 25 mg IV.   FINDINGS:  Olympus single channel videoscope passed into the transverse and  into the sigmoid colon.  The patient was monitored by pulse oximeter with  oxygen saturations normal.  The prep was excellent.  Anal canal and rectal  ampulla was normal.  Sigmoid colon showed thickened ostial folds and few  scattered diverticula but exam was rather easy and there was no spasm or  tortuosity.  Splenic flexure, transverse colon, and hepatic flexure was  normal as was right colon and cecum.  The cecal pouch and ileocecal valve  were unremarkable.  Colonoscope was slowly retracted from the right to the  left colon.  The patient tolerated the procedure well.   IMPRESSION:  Normal colonoscopy to the cecum with minimal diverticulosis in  the left colon.   PLAN:  1. High fiber diet.  2. Use antispasmodics such as Levsin p.r.n. abdominal pain.  3. Repeat colonoscopy in 10 years.                                               [name], M.D. [facility]    DMB/MEDQ  D:  09/05/2001  T:  09/05/2001  Job:  [id number]   cc:   [name]., M.D.

## 2010-05-30 NOTE — H&P (Signed)
NAME:  [name], [name]:  [id number]   MEDICAL RECORD NO.:  [id number]                   PATIENT TYPE:  INP   LOCATION:  NA                                   FACILITY:  [facility]   PHYSICIAN:  Payton Doughty, M.D.                   DATE OF BIRTH:  Dec 08, 1929   DATE OF ADMISSION:  09/04/2003  DATE OF DISCHARGE:                                HISTORY & PHYSICAL   ADMISSION DIAGNOSIS:  Cervical spondylosis C4-C5 and C5-C6.   HISTORY OF PRESENT ILLNESS:  This is a very nice, now 77 year old, right-  handed, white gentleman who had been seen for about a year sent to me with  pain in his neck.  MRI showed severe cord compression at C5-C6, mild  compression at C6-C7 and a little more severe compression at C4-C5.  He had  pain in his neck which had resolved.  He did not describe any radicular  complaints.  He has had a stroke in the past in his thalamus and internal  capsule which has left him with a little bit of weakness in his left arm  which may be attributable to that stroke.  It has not progressed much.  His  neck pain has increased.  Reimaging shows increased signal in his cord at C5-  C6 and he is therefore admitted for anterior cervical decompression and  fusion at C4-C5 and C5-C6.   PAST MEDICAL HISTORY:  1. Appendectomy.  2. Laminectomy in the past.  3. Transurethral prostate resection and bilateral cataracts.   MEDICATIONS:  1. Benicar 40 mg a day.  2. Norvasc 10 mg a day.  3. Plavix 75 mg a day.  4. Neurontin 300 mg.  5. Prilosec 20 mg p.r.n.  6. Ventolin p.r.n.   ALLERGIES:  No known drug allergies.   SOCIAL HISTORY:  He does not smoke or drink.  He retired and lives in  [place].   FAMILY HISTORY:  Mom died of old age.  Dad died of COPD.   REVIEW OF SYMPTOMS:  Remarkable for neck pain and numbness in his left arm.  HEENT:  Within normal limits.  Reasonable range of motion in neck.  CHEST:  Clear.  CARDIAC:  Regular rate and rhythm.   ABDOMEN:  Nontender, no  hepatosplenomegaly.  EXTREMITIES:  No clubbing or cyanosis.  GU:  Deferred.  VASCULAR:  Peripheral pulses are good.  NEUROLOGIC:  Awake, alert and  oriented.  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  Motor exam shows 5/5 strength  throughout the upper extremities with significant left pronator drift.  Finger-to-nose pointing with the eyes closed is not performed very well.  Left arm gait is fairly normal.  Reflexes are 2 at the biceps, 2 at the  triceps, 1 at the brachioradialis.  He has a left Hoffmann's and a mild  right Hoffmann's at this time.  There  is no clonus.   LABORATORY DATA AND X-RAY FINDINGS:  MRI results have been reviewed and  showed spondylosis at C4-C5 and C5-C6 with abnormal cord signal at C5-C6.   IMPRESSION:  Cervical spondylosis with early myelopathy.   PLAN:  Anterior surgical decompression at C4-C5 and C5-C6.  The risks and  benefits of this approach have been discussed with him and he wishes to  proceed.                                                Payton Doughty, M.D.    MWR/MEDQ  D:  09/04/2003  T:  09/04/2003  Job:  [id number]

## 2010-05-30 NOTE — H&P (Signed)
[place]. [facility]  Patient:    Scott, Harmon                        MRN: [id number] Adm. Date:  [id number] Attending:  Lesly Dukes CC:         [facility]   History and Physical  CHIEF COMPLAINT: Scott Argyle. Harmon is a 77 year old right-handed white male, born [birth year], with a history of relatively good health recently.  This patient comes into the emergency room today with a 30 minute history of sudden onset of left-sided paresthesias and weakness.  HISTORY OF PRESENT ILLNESS: The patient noted paresthesias of the face, arm, and leg, most severe of the arm.  The patient denies any visual field changes, denies headaches, slurred speech.  The patient did not black out at any time. He was able to crawl for the phone, not able to ambulate.  He called EMS himself and came to the emergency room.  Neurology is asked to see this patient as part of code stroke protocol.  PAST MEDICAL HISTORY:  1. New onset left-sided paresthesias and weakness, rule out thalamic stroke     on the right.  2. Status post appendectomy.  3. History of lumbosacral laminectomy.  4. History of benign prostatic hypertrophy with TURP in the past.  CURRENT MEDICATIONS: The patient claims he is on no medications. ALLERGIES: No known drug allergies.  SOCIAL HISTORY: The patient does not currently smoke or drink.  Primary doctor is Dr. Cecille Rubin.  SOCIAL HISTORY: The [place], is married, and has three children alive and well.  He works for the [employer] in [place].  FAMILY HISTORY: Mother died of, "old age".  Father died with black lung disease.  Five to six brothers and sisters - one brother died with MI and one with cancer.  REVIEW OF SYSTEMS: Notable for no recent fever or chills.  The patient denies headache, denies problems with speech, denies shortness of breath, chest pain, or  palpitations.  He denies any problems with nausea or vomiting.  He denies any problems with control of bowel or bladder.  He denies dizziness or blackout.  PHYSICAL EXAMINATION:  VITAL SIGNS: Blood pressure 162/78, heart rate 78 and regular, respiratory rate 14.  He is afebrile.  GENERAL: This patient is a well-developed white male, who is alert and cooperative at the time of examination.  HEENT: Head atraumatic.  PERRL.  Discs flat bilaterally.  NECK: Supple.  No carotid bruits noted.  RESPIRATORY: Clear to auscultation and percussion.  CARDIOVASCULAR: Regular rate and rhythm.  No obvious murmurs or rubs noted.  ABDOMEN: Positive bowel sounds, no organomegaly or tenderness noted.  EXTREMITIES: Without significant edema.  NEUROLOGIC: Cranial nerves as noted above.  Facial symmetry is present.  The patient has decreased pinprick sensation on the left side of the face compared to the right, with paresthesias present.  There is decreased pinprick sensation on the left arm and left leg compared to the right.  Paresthesias are present and it is almost painful to move the left arm.  The patient has depression of vibratory sensation of the left arm and left leg compared with the right.  He has 4/5 strength of the left arm and left leg, as compared to the right with normal strength.  Deep tendon reflexes are symmetric and present bilaterally.  Toes are neutral bilaterally.  He has  mild left upper extremity drift.  He has slight ataxia with finger-to-nose-to-finger on the left arm, not present on the right.  Slight ataxia with toe-to-finger of the left leg, not present on the right.  The patient was not ambulated.  LABORATORY DATA: Laboratory values on admission and CT scan of the brain are pending at this time.  IMPRESSION: Acute stroke, right brain; probable right thalamic infarct.  This patient has had an acute stroke that occurred about 30 minutes prior to coming to the  hospital.  This patient may be a candidate for TPA, although this stroke likely represents a lacunar type infarct which may be less responsive to TPA administration.  At any rate, TPA may be offered to this patient if CT scan of the brain is unremarkable.  Need to rule out intracranial hemorrhage.  The patient is not complaining of a headache.  PLAN:  1. CT scan of the brain.  2. IV fluids, normal saline 100 cc an hour.  3. Consider patient for TPA.  4. MRI scan of the brain.  5. MR angiogram.  6. Obtain 2D echocardiogram.  7. Carotid Doppler study.  8. Physical and occupational therapy evaluation.  9. The patient may require ICU admission if TPA is administered.  EKG done     through the ER today shows normal sinus rhythm with left anterior     fascicular block, heart rate of 72. D:  08/22/99 TD:  08/22/99 Job: [id number]

## 2010-05-30 NOTE — Discharge Summary (Signed)
[place]. [facility]  Patient:    [name], [name]                        MRN: [id number] Adm. Date:  [id number] Disc. Date: [id number] Attending:  Cathren Laine CC:         [facility]  Dr. Cecille Rubin   Discharge Summary  ADMISSION DIAGNOSIS:  New onset of right brain stroke, probable right thalamic stroke.  DISCHARGE DIAGNOSIS:  Right thalamic stroke.  PROCEDURES DURING ADMISSION: 1. CT of the brain. 2. TPA administration. 3. MRI of the brain. 4. MR angiogram. 5. Carotid Doppler study. 6. A 2D echocardiogram.  COMPLICATIONS OF ABOVE PROCEDURES:  None.  The patient tolerated the procedures well.  HISTORY OF PRESENT ILLNESS:  [name] is a 77 year old right-handed white male born 03/21/1929, with a history of relatively good health prior to this admission.  The patient had noted onset of left-sided paresthesias, weakness, possibility of a thalamic stroke was considered.  The patient denied headache, slurred speech and did not black out at any time.  The patient, at the onset of the deficit, was unable to walk but able to crawl to the phone. He called EMS and was brought to the emergency room.  The patient was within the time frame for possible TPA administration.  PAST MEDICAL HISTORY: 1. New onset of left-sided paresthesias and weakness. 2. Status post appendectomy. 3. History of lumbosacral laminectomy. 4. Benign prostatic hypertrophy with TURP.  MEDICATIONS: None.  ALLERGIES:  No known drug allergies.  SOCIAL HISTORY:  He does smoke or drink.  Please refer to history and physical dictation for additional social history, family history, review of systems, and physical examination.  LABORATORY EVALUATION:  Sodium 142, potassium 4.4, chloride 109, BUN 12, glucose 105.  Hematocrit 44, hemoglobin 15.0.  Sed rate of 5.  Calcium 8.2. Cholesterol 187. RPR nonreactive.  EKG reveals normal sinus rhythm, left anterior  fascicular block noted.  Heart rate of 72.  CT scan of the head showed no evidence of acute abnormalities.  HOSPITAL COURSE:  The patient did fairly well during the course of hospitalization.  The patient was admitted with evidence of right brain stroke and left-sided symptoms noted.  The patient was given TPA as he was within the three hour limit for stroke events. The patient was admitted to the intensive care unit and remained neurologically stable. The patient had an MRI scan of the brain showing 1 to 1.5 cm acute stroke in the right thalamus and possibly the posterior limb of the internal capsule, mild small vessel ischemic changes otherwise noted.  MR angiogram shows marked medium and small vessel disease changes, anterior and posterior circulation extensive atherosclerotic changes seen in the cerebellar circulation.  A 2D echocardiogram was done showing poor quality study, no obvious cardiogenic source of stroke was seen.  Carotid Doppler study notable for no evidence of internal carotid artery stenosis, antegrade vertebral artery flow was seen.  The patient was seen by the rehab team and was felt to be a good candidate for acute rehab.  The patient was treated with aspirin and Plavix following the TPA administration.  The patient was felt to have some pain following the thalamic stroke and was responding some to Neurontin.  The patient was transferred to rehab on August 26, 1999, for acute rehab.  At the time of discharge, the patient was on aspirin and Plavix and was on Pepcid  20 mg b.i.d., Neurontin 300 mg q.8h., Senokot tablets one twice a day.  The patient was noted to have some slight left-sided weakness predominantly with the left upper extremity.  The patient had left hemisensory deficit as well.  The patient was fully conversive. DD:  10/20/99 TD:  10/21/99 Job: [id number]

## 2010-06-04 ENCOUNTER — Other Ambulatory Visit: Payer: Self-pay

## 2010-06-04 MED ORDER — AMLODIPINE BESYLATE 10 MG PO TABS: 10.0000 mg | ORAL_TABLET | Freq: Every day | ORAL | Status: DC

## 2010-06-04 MED ORDER — HYDROCODONE-ACETAMINOPHEN 5-500 MG PO TABS: 1.0000 | ORAL_TABLET | Freq: Four times a day (QID) | ORAL | Status: AC | PRN

## 2010-06-04 MED ORDER — CLONIDINE HCL 0.2 MG PO TABS: 0.2000 mg | ORAL_TABLET | Freq: Three times a day (TID) | ORAL | Status: DC

## 2010-06-04 NOTE — Telephone Encounter (Signed)
rx sent to [facility].

## 2010-06-11 ENCOUNTER — Telehealth: Payer: Self-pay | Admitting: Internal Medicine

## 2010-06-11 DIAGNOSIS — R001 Bradycardia, unspecified: Secondary | ICD-10-CM

## 2010-06-11 NOTE — Telephone Encounter (Signed)
Pt wife states his blood pressure and his pulse have been low. Pt wants to talk to a nurse.

## 2010-06-11 NOTE — Telephone Encounter (Signed)
Patient was to d/c omeprazole after his endoscopy in April 2011. However, wife is calling wanting refills. Dr Juanda Chance, would you like me to go ahead and fill rx again? Also, patient has hx of anemia. Would you like him to have any bloodwork (last CBC 02/11/10)?

## 2010-06-11 NOTE — Telephone Encounter (Signed)
[name] calling stating BP and pulse low--states BP running around 140/60 with pulse in 40's--wife states no change in meds--wife also states pt having back pain and may be needing clearance for epidural steroid inj--when asked if pt was taking pain meds, wife states he's taking hydrocodone for pain--advised pain med may be causing low pulse--also advised i would let dr Tenny Craw and Annice Pih know and if dr Tenny Craw concerned she will call--pt's wife agrees--also advised if wife feels pulse too low, please go to nearest ED--wife agrees--nt

## 2010-06-11 NOTE — Telephone Encounter (Signed)
OK to refill Prilosec 20 mg, #30, 2 refills. No need for CBC

## 2010-06-12 ENCOUNTER — Telehealth: Payer: Self-pay | Admitting: Internal Medicine

## 2010-06-12 MED ORDER — OMEPRAZOLE 40 MG PO CPDR: 40.0000 mg | DELAYED_RELEASE_CAPSULE | Freq: Every day | ORAL | Status: DC

## 2010-06-12 NOTE — Telephone Encounter (Signed)
rx sent

## 2010-06-12 NOTE — Telephone Encounter (Signed)
noted 

## 2010-06-12 NOTE — Telephone Encounter (Signed)
I have left a message for patient to call back. I need to know what pharmacy they would like script to be sent to.

## 2010-06-13 NOTE — Telephone Encounter (Signed)
Called patient back and advised that Dr.Ross recommends a holter monitor. Patient agrees to this. Order sent to [facility].

## 2010-06-13 NOTE — Telephone Encounter (Signed)
Set him up for a holter monitor. BP is not bad, not too low.

## 2010-06-16 ENCOUNTER — Telehealth: Payer: Self-pay | Admitting: Internal Medicine

## 2010-06-17 ENCOUNTER — Telehealth: Payer: Self-pay | Admitting: Internal Medicine

## 2010-06-17 DIAGNOSIS — I10 Essential (primary) hypertension: Secondary | ICD-10-CM

## 2010-06-17 NOTE — Telephone Encounter (Signed)
Per pts wife - pt saw MD today to discuss back injections d/t back pain.  Wife states his BP was very high at 196 and 192 systolic (she did not know the diastolic).  Wife states she is sure his elevated BP is not coming from his back pain.  Wife reports pt is taking all medications as ordered.  Wife also states they cancelled the holter monitor today because they were too far away and didn't know why he needed it anyway or how long he would have to wear it.  Explained to wife the holter would be worn for a 24 hour period of time and was ordered by Dr Tenny Craw to help determine the extend of the bradycardia he is having.  Wife states understanding and is requesting to reschedule pt for monitor placement.  A staff message will be sent to [facility] to contact the pt to schedule.  Wife also mentioned that the back doctor that they saw today wants to know if pt can hold his Plavix for 7 days prior to his back injection however the MD is faxing over the request.  Will forward information to Dr Tenny Craw and Annice Pih for review and orders.

## 2010-06-17 NOTE — Telephone Encounter (Signed)
Pt has some questions regarding his blood pressure being elevated and also why to wear monitor and how long will he wear it.  It was scheduled for today and they cancelled the appt.

## 2010-06-19 ENCOUNTER — Other Ambulatory Visit: Payer: Self-pay | Admitting: Cardiology

## 2010-06-19 ENCOUNTER — Telehealth: Payer: Self-pay | Admitting: Internal Medicine

## 2010-06-19 ENCOUNTER — Encounter (INDEPENDENT_AMBULATORY_CARE_PROVIDER_SITE_OTHER): Payer: Medicare Other

## 2010-06-19 DIAGNOSIS — I498 Other specified cardiac arrhythmias: Secondary | ICD-10-CM

## 2010-06-19 DIAGNOSIS — I10 Essential (primary) hypertension: Secondary | ICD-10-CM

## 2010-06-19 NOTE — Telephone Encounter (Signed)
Pt. Had renal ultrasound on November 15, 2009. Results reviewed with Dr. Tenny Craw and pt does not need repeat at this time. Per Dr Tenny Craw he will need to have blood pressure checked when he returns holter monitor on June 20, 2010.  Pt notified and he will bring blood pressure log of recent readings when he returns monitor.

## 2010-06-19 NOTE — Telephone Encounter (Signed)
OK to stop Plavix.  Resume after surgery.

## 2010-06-19 NOTE — Telephone Encounter (Signed)
Spoke with wife who reports patient's blood pressure was 118/58 and 188/80 yesterday. Wife given Dr. Tenny Craw' recommendation for renal ultra sound and states pt can be here for this tomorrow at 7:30. Instructions for ultrasound given to wife. Wife aware we will also follow up with Dr. Tenny Craw regarding need for Plavix to be held for injection. Wife states injection planned for June 27, 2010.

## 2010-06-19 NOTE — Telephone Encounter (Signed)
I would recomm that when he comes to pick up holter (to evaluate bradycardia) he also have a renal USN done as bp is now unstable. Check BP in clinic when he comes in for these things.

## 2010-06-19 NOTE — Telephone Encounter (Signed)
Called and spoke with Jae Dire at Dr Sherene Sires

## 2010-06-19 NOTE — Telephone Encounter (Signed)
Dr Thurston Hole office had question re pt plavix. Dr office wants to know if they can stop pt plavix for seven days.

## 2010-06-19 NOTE — Telephone Encounter (Signed)
Called Jae Dire was gone for the day  Spoke with Rosey Bath  Will fax over okay to stop Plavix to [phone]

## 2010-06-19 NOTE — Telephone Encounter (Signed)
Wife notified that Dr. Tenny Craw has given approval to stop Plavix as requested by Dr. Sherene Sires office and that it is to be resumed after procedure.  Wife aware we have faxed this information to Dr. Sherene Sires office (see phone note June 19, 2010)

## 2010-06-20 ENCOUNTER — Telehealth: Payer: Self-pay | Admitting: Internal Medicine

## 2010-06-20 ENCOUNTER — Telehealth: Payer: Self-pay | Admitting: *Deleted

## 2010-06-20 ENCOUNTER — Encounter: Payer: Medicare Other | Admitting: Cardiology

## 2010-06-20 NOTE — Telephone Encounter (Signed)
Spoke with pt and gave him results of holter monitor. Also told pt Dr. Tenny Craw reviewed blood pressure log and no changes in meds needed at this time.

## 2010-06-20 NOTE — Telephone Encounter (Signed)
Walk In pt From " Pt Dropped Off Blood Pressure Readings" sent to Pat/Ross  06/20/10/km

## 2010-08-05 ENCOUNTER — Telehealth: Payer: Self-pay | Admitting: Internal Medicine

## 2010-08-05 ENCOUNTER — Other Ambulatory Visit: Payer: Self-pay

## 2010-08-05 ENCOUNTER — Other Ambulatory Visit: Payer: Self-pay | Admitting: Family Medicine

## 2010-08-05 NOTE — Telephone Encounter (Signed)
Pt called and has severe back pain. Pt req a refill of HYDROcodone-acetaminophen (LORTAB) 7.5-500 MG per tablet . Pls [facility] in to [facility] [address]

## 2010-08-05 NOTE — Telephone Encounter (Signed)
Pt needs a letter of clearance for a epidural shot in his back sent to Dr. Annett Gula fax# [phone] pt needs to stop taking plavix for 7days pt needs this done today

## 2010-08-05 NOTE — Telephone Encounter (Signed)
Patient would like to have a letter for clearance to have a epidural shot with Dr. Gerri Spore. Patient needs to be off Plavix 7 days prior procedure. Pt. Would like to have letter faxed to [phone].

## 2010-08-06 ENCOUNTER — Other Ambulatory Visit: Payer: Self-pay

## 2010-08-06 ENCOUNTER — Telehealth: Payer: Self-pay | Admitting: Family Medicine

## 2010-08-06 MED ORDER — HYDROCODONE-ACETAMINOPHEN 7.5-500 MG PO TABS: 1.0000 | ORAL_TABLET | ORAL | Status: AC | PRN

## 2010-08-06 NOTE — Telephone Encounter (Signed)
Per Dr. Scotty Court pt is to get hydrocodone from the doctor that is treating the patient (Dr. Carolan Clines).  Dr. Scotty Court called pt and pt is aware.  Pt states he cannot see Dr. Carolan Clines for another week.  Per Dr. Scotty Court pt can have hydrocodone 7.5-500 #50 with no refills.  Pt is aware and rx has been called into pharmacy.

## 2010-08-06 NOTE — Telephone Encounter (Signed)
Pt need to come off plavix for 7 days. Due to epidural shot in his back . Dr. Gerri Spore [phone].

## 2010-08-06 NOTE — Telephone Encounter (Signed)
Pt. Received phone call yesterday saying prescription was called into pharmacy. Pt went to pick up and pharmacy did not have prescription. Pt requested to resend.

## 2010-08-06 NOTE — Telephone Encounter (Signed)
Patient called today again, he would like to be off Plavix for 7 days prior an epidural shot. The letter for clearance needs to be fax to Dr.Perry at [phone]. Pt. Was seen in the office  by Dr. Tenny Craw on 05/09/10. Pt is aware MD will in the office on Friday 08/08/10.

## 2010-08-06 NOTE — Telephone Encounter (Signed)
Done

## 2010-08-08 ENCOUNTER — Telehealth: Payer: Self-pay | Admitting: *Deleted

## 2010-08-08 ENCOUNTER — Encounter: Payer: Self-pay | Admitting: *Deleted

## 2010-08-08 NOTE — Telephone Encounter (Signed)
Patient aware that per Dr.Ross, it is OK to hold Plavix 7 days prior to procedure. Letter faxed to his doctor with this information.

## 2010-08-08 NOTE — Telephone Encounter (Signed)
[name] never received fax. [phone] or [phone].

## 2010-08-08 NOTE — Telephone Encounter (Signed)
Done per Suzan Garibaldi.

## 2010-08-08 NOTE — Telephone Encounter (Signed)
Pt calling to let you know the faxed was not received, thinks maybe we had wrong number , can you refax to [phone] [id number]

## 2010-08-08 NOTE — Telephone Encounter (Signed)
Faxed again

## 2010-08-20 ENCOUNTER — Other Ambulatory Visit: Payer: Self-pay

## 2010-08-20 ENCOUNTER — Telehealth: Payer: Self-pay | Admitting: Family Medicine

## 2010-08-20 NOTE — Telephone Encounter (Signed)
Mrs. Lauderback called  She is extremely worried about her husband he recently  Had a mail order prescription of Hydrocodone of 180 pills and also [facility]. Mrs. Valiente informed me that pt takes the medication if he is in pain or not. She also informed me he has been to treatment several times for addictions in the past and she" doesn't want to put him in again at 80". Please contact pt wife she is very concerned.

## 2010-08-21 NOTE — Telephone Encounter (Signed)
Called and spoke with local pharmacy and [facility] and per pharmacist pt received hydrocodone 5-325 #40 from Dr. Romero Belling.

## 2010-08-22 NOTE — Telephone Encounter (Signed)
Dr. Scotty Court aware of prescriptions being called in and will call the patient.

## 2010-08-25 NOTE — Telephone Encounter (Signed)
Per Dr. Scotty Court spoke with pt

## 2010-08-26 ENCOUNTER — Ambulatory Visit: Payer: MEDICARE | Admitting: Vascular Surgery

## 2010-08-26 ENCOUNTER — Other Ambulatory Visit: Payer: Self-pay

## 2010-09-08 ENCOUNTER — Encounter: Payer: Self-pay | Admitting: *Deleted

## 2010-09-08 ENCOUNTER — Telehealth: Payer: Self-pay | Admitting: Internal Medicine

## 2010-09-08 NOTE — Telephone Encounter (Signed)
[name] at [employer] office. Patient is having his 3rd injection and they need another letter from [place] stating that it is OK to hold Plavix prior to procedure. Will fax to [phone].

## 2010-09-08 NOTE — Telephone Encounter (Signed)
Stephanie from dr office states they need a written order Fax# [phone] stating  pt able to be off his plavix for a week in order to get his injection on his back.

## 2010-09-22 ENCOUNTER — Ambulatory Visit: Payer: Medicare Other | Admitting: Family Medicine

## 2010-09-24 ENCOUNTER — Encounter: Payer: Self-pay | Admitting: Family Medicine

## 2010-09-24 ENCOUNTER — Ambulatory Visit (INDEPENDENT_AMBULATORY_CARE_PROVIDER_SITE_OTHER): Payer: Medicare Other | Admitting: Family Medicine

## 2010-09-24 ENCOUNTER — Telehealth: Payer: Self-pay | Admitting: Family Medicine

## 2010-09-24 VITALS — BP 132/70 | HR 73 | Temp 97.6°F | Wt 174.0 lb

## 2010-09-24 DIAGNOSIS — M25519 Pain in unspecified shoulder: Secondary | ICD-10-CM

## 2010-09-24 MED ORDER — HYDROCODONE-ACETAMINOPHEN 7.5-500 MG PO TABS: 2.0000 | ORAL_TABLET | Freq: Four times a day (QID) | ORAL | Status: AC | PRN

## 2010-09-24 NOTE — Telephone Encounter (Signed)
Pt wife called again and is very concerned about her husband. And would like you to contact her asap

## 2010-09-24 NOTE — Telephone Encounter (Addendum)
Pt was given hydrocodone by doc Maurice Small (ortho doc with dr [facility]) yesterday. Doc Clent Ridges prescribed same med today. Pt wife Randa Evens is very concern please call her today cell#[phone].

## 2010-09-24 NOTE — Progress Notes (Signed)
  Subjective:    Patient ID: [name], male    DOB: 09/09/1929, 77 y.o.   MRN: [id number]  HPI Here for chronic left shoulder pain. He says he broke the left upper arm "in three places" a few years ago, and he has had pain in it ever since. He sees Dr. Thurston Harmon frequently for low back pain, and in fact he has the last of 3 ESI injections to the lower back 2 weeks ago. He takes Vicodin for pain, and I see from his chart that there has been some concern about his usage of pain meds. He says he has never seen Dr. Thurston Harmon about the shoulder, oddly enough.   Review of Systems  Constitutional: Negative.   Musculoskeletal: Positive for arthralgias.       Objective:   Physical Exam  Constitutional: He appears well-developed and well-nourished.  Musculoskeletal:       The left shoulder is tender in the subacromial area and in the deltoid area. There is some crepitus. His ROM is limited by pain          Assessment & Plan:  Refilled Vicodin for one month to cover him until Dr. Scotty Harmon is back from leave. He will see Dr. Thurston Harmon again next week, and I urged him to have Dr. Thurston Harmon look at the shoulder also

## 2010-09-25 NOTE — Telephone Encounter (Signed)
Please call the wife. The rx I gave him had no refills at least. There is nothing we can do about it if he has already filled the rx I gave him yesterday. Possible the Orthopedic office could call the pharmacy and cancel any refills they may have left

## 2010-09-25 NOTE — Telephone Encounter (Signed)
[name] called upset that her husband had received narcotics.  [name] stated pt has been to rehab in previous years.  Discussed pt's history with wife and the fact that Dr. Clent Ridges is now aware of pt's history.  [name] is now calm and understands this matter has been handled.

## 2010-09-29 ENCOUNTER — Encounter: Payer: Self-pay | Admitting: Vascular Surgery

## 2010-09-30 ENCOUNTER — Ambulatory Visit (INDEPENDENT_AMBULATORY_CARE_PROVIDER_SITE_OTHER): Payer: Medicare Other | Admitting: Vascular Surgery

## 2010-09-30 ENCOUNTER — Encounter: Payer: Self-pay | Admitting: Vascular Surgery

## 2010-09-30 ENCOUNTER — Other Ambulatory Visit (INDEPENDENT_AMBULATORY_CARE_PROVIDER_SITE_OTHER): Payer: Medicare Other | Admitting: *Deleted

## 2010-09-30 VITALS — BP 170/72 | HR 62 | Resp 18 | Ht 71.0 in | Wt 174.0 lb

## 2010-09-30 DIAGNOSIS — I6529 Occlusion and stenosis of unspecified carotid artery: Secondary | ICD-10-CM

## 2010-09-30 DIAGNOSIS — Z48812 Encounter for surgical aftercare following surgery on the circulatory system: Secondary | ICD-10-CM

## 2010-09-30 NOTE — Progress Notes (Signed)
The patient presents today for followup of his carotid endarterectomy 02/10/2010 for high-grade asymptomatic carotid stenosis. He is returned to his usual baseline. He has had no neurologic deficits. His incision has had no discomfort and he has no remaining. Incisional numbness. His medical history is otherwise unchanged with no new difficulty.  Past Medical History  Diagnosis Date  . HTN (hypertension)   . 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 History of prostate cancer   . Esophageal stricture     benigh distal  . Gastritis   . Esophagitis     grade 2  . Diverticulosis   . GERD (gastroesophageal reflux disease)   . Hemorrhoids   . Allergic rhinitis     History  Substance Use Topics  . Smoking status: Former Smoker    Types: Cigarettes  . Smokeless tobacco: Current User    Types: Chew  . Alcohol Use: No    Family History  Problem Relation Age of Onset  . Lung disease    . Heart attack    . Cancer      Allergies  Allergen Reactions  . Codeine   . Oxycodone-Acetaminophen     Current outpatient prescriptions:amLODipine (NORVASC) 10 MG tablet, Take 1 tablet (10 mg total) by mouth daily., Disp: 90 tablet, Rfl: 2;  aspirin 325 MG tablet, Take 325 mg by mouth daily.  , Disp: , Rfl: ;  cloNIDine (CATAPRES) 0.2 MG tablet, Take 1 tablet (0.2 mg total) by mouth 3 (three) times daily., Disp: 90 tablet, Rfl: 2;  clopidogrel (PLAVIX) 75 MG tablet, Take 75 mg by mouth daily.  , Disp: , Rfl:  methyldopa (ALDOMET) 500 MG tablet, Take 500 mg by mouth 2 (two) times daily.  , Disp: , Rfl: ;  omeprazole (PRILOSEC) 40 MG capsule, Take 1 capsule (40 mg total) by mouth daily., Disp: 30 capsule, Rfl: 2;  polyethylene glycol (MIRALAX / GLYCOLAX) packet, Take 17 g by mouth as needed. , Disp: , Rfl: ;  senna-docusate (SENOKOT-S) 8.6-50 MG per tablet, Take 1 tablet by mouth as needed.  , Disp: , Rfl:  valsartan (DIOVAN) 160 MG tablet, Take 160 mg by mouth daily.  , Disp: , Rfl: ;  gabapentin (NEURONTIN)  300 MG capsule, Take 300 mg by mouth 3 (three) times daily.  , Disp: , Rfl: ;  HYDROcodone-acetaminophen (LORTAB) 7.5-500 MG per tablet, Take 2 tablets by mouth every 6 (six) hours as needed for pain., Disp: 120 tablet, Rfl: 0  BP 170/72  Pulse 62  Resp 18  Ht 5\' 11"  (1.803 m)  Wt 174 lb (78.926 kg)  BMI 24.27 kg/m2  Body mass index is 24.27 kg/(m^2).       Review of systems no change  Physical exam: Well-developed well-nourished white male in no acute distress. HEENT normal. Well-healed carotid incision on the right with no bruits bilaterally. 2+ radial pulses bilaterally. Grossly intact neurologically.  Carotid duplex: Widely patent right endarterectomy and no evidence of left carotid stenosis  Assessment and plan: Stable status post right carotid endarterectomy. Followup duplex in 6 months. The patient notify should he develop any neurologic deficits.

## 2010-10-08 NOTE — Procedures (Unsigned)
CAROTID DUPLEX EXAM  INDICATION:  Followup carotid endarterectomy.  HISTORY: Diabetes:  No. Cardiac:  No. Hypertension:  Yes. Smoking:  Previous. Previous Surgery:  Right CEA 02/10/2010. CV History:  CVA 2001. Amaurosis Fugax No, Paresthesias No, Hemiparesis No                                      RIGHT             LEFT Brachial systolic pressure:         168               168 Brachial Doppler waveforms:         WNL               WNL Vertebral direction of flow:        Antegrade         Not identified DUPLEX VELOCITIES (cm/sec) CCA peak systolic                   88                84 ECA peak systolic                   137               155 ICA peak systolic                   98                64 ICA end diastolic                   26                18 PLAQUE MORPHOLOGY: PLAQUE AMOUNT:                      NA                NA PLAQUE LOCATION:  IMPRESSION: 1. Widely patent right carotid endarterectomy without evidence of     hyperplasia or restenosis. 2. Widely patent left internal carotid artery. 3. Significant stenosis of left subclavian artery with velocity of 355     cm/s. 4. Left vertebral artery could not be visualized. 5. Right vertebral artery is within normal limits. 6. Vascularized lymph node in left thyroid measuring approximately     0.75 cm x 1.1 cm.  ___________________________________________ Larina Earthly, M.D.  LT/MEDQ  D:  09/30/2010  T:  09/30/2010  Job:  [id number]

## 2010-10-13 ENCOUNTER — Encounter: Payer: Self-pay | Admitting: Internal Medicine

## 2010-10-13 ENCOUNTER — Ambulatory Visit (INDEPENDENT_AMBULATORY_CARE_PROVIDER_SITE_OTHER): Payer: Medicare Other | Admitting: Internal Medicine

## 2010-10-13 VITALS — BP 169/73 | HR 54 | Resp 18 | Ht 71.0 in | Wt 176.1 lb

## 2010-10-13 DIAGNOSIS — E785 Hyperlipidemia, unspecified: Secondary | ICD-10-CM

## 2010-10-13 DIAGNOSIS — E7800 Pure hypercholesterolemia, unspecified: Secondary | ICD-10-CM

## 2010-10-13 DIAGNOSIS — I10 Essential (primary) hypertension: Secondary | ICD-10-CM

## 2010-10-13 NOTE — Patient Instructions (Signed)
Fasting lab work at [facility] this week.  Your physician wants you to follow-up in: 6 months. You will receive a reminder letter in the mail two months in advance. If you don't receive a letter, please call our office to schedule the follow-up appointment.

## 2010-10-13 NOTE — Progress Notes (Signed)
HPI Patient is a 77 year old with a history of HTN, CV disease, dyslipidemia.  I last saw him in clinic in April. SInce then has been doing OK.  Problesm with back. BPsa at home in 140s to 160s/  Mostly 140/60s. Denies CP.  No SOB> SOmetimes forgets to take clonidine in mid day.  Sometimes pulse gets so low he holds meds. Drinks a cup or 2 per day of coffee.  Allergies  Allergen Reactions  . Codeine   . Oxycodone-Acetaminophen     Current Outpatient Prescriptions  Medication Sig Dispense Refill  . amLODipine (NORVASC) 10 MG tablet Take 1 tablet (10 mg total) by mouth daily.  90 tablet  2  . aspirin 325 MG tablet Take 325 mg by mouth daily.        . cloNIDine (CATAPRES) 0.2 MG tablet Take 0.2 mg by mouth 3 (three) times daily. 2 or 3 x a day       . clopidogrel (PLAVIX) 75 MG tablet Take 75 mg by mouth daily.        [name] Kitchen gabapentin (NEURONTIN) 300 MG capsule Take 300 mg by mouth 3 (three) times daily.        . methyldopa (ALDOMET) 500 MG tablet Take 500 mg by mouth 2 (two) times daily.        [name] Kitchen omeprazole (PRILOSEC) 40 MG capsule Take 1 capsule (40 mg total) by mouth daily.  30 capsule  2  . polyethylene glycol (MIRALAX / GLYCOLAX) packet Take 17 g by mouth as needed.       . senna-docusate (SENOKOT-S) 8.6-50 MG per tablet Take 1 tablet by mouth as needed.        . valsartan (DIOVAN) 160 MG tablet Take 160 mg by mouth daily.          Past Medical History  Diagnosis Date  . HTN (hypertension)   . 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 History of prostate cancer   . Esophageal stricture     benigh distal  . Gastritis   . Esophagitis     grade 2  . Diverticulosis   . GERD (gastroesophageal reflux disease)   . Hemorrhoids   . Allergic rhinitis     Past Surgical History  Procedure Date  . Back surgery 8/07    cervical spine  . Back surgery     lumbar  . Appendectomy   . Cataract extraction   . Insertion prostate radiation seed     Family History  Problem Relation Age of  Onset  . Lung disease    . Heart attack    . Cancer      History   Social History  . Marital Status: Married    Spouse Name: N/A    Number of Children: N/A  . Years of Education: N/A   Occupational History  . retired    Social History Main Topics  . Smoking status: Former Smoker    Types: Cigarettes  . Smokeless tobacco: Current User    Types: Chew  . Alcohol Use: No  . Drug Use: No  . Sexually Active: Not on file   Other Topics Concern  . Not on file   Social History Narrative  . No narrative on file    Review of Systems:  All systems reviewed.  They are negative to the above problem except as previously stated.  Vital Signs: BP 169/73  Pulse 54  Resp 18  Ht 5\' 11"  (1.803  m)  Wt 176 lb 1.9 oz (79.888 kg)  BMI 24.56 kg/m2  Physical Exam Patient is in NAD  HEENT:  Normocephalic, atraumatic. EOMI, PERRLA.  Neck: JVP is normal. No thyromegaly. No bruits.  Lungs: clear to auscultation. No rales no wheezes.  Heart: Regular rate and rhythm. Normal S1, S2. No S3.   No significant murmurs. PMI not displaced.  Abdomen:  Supple, nontender. Normal bowel sounds. No masses. No hepatomegaly.  Extremities:   Good distal pulses throughout. No lower extremity edema.  Musculoskeletal :moving all extremities.  Neuro:   alert and oriented x3.  CN II-XII grossly intact. EKG:  Sinus bradycardia.  54 bpm.    Assessment and Plan:

## 2010-10-14 ENCOUNTER — Ambulatory Visit: Payer: Medicare Other

## 2010-10-14 DIAGNOSIS — E7800 Pure hypercholesterolemia, unspecified: Secondary | ICD-10-CM

## 2010-10-14 DIAGNOSIS — I10 Essential (primary) hypertension: Secondary | ICD-10-CM

## 2010-10-14 LAB — LIPID PANEL
HDL: 35.6 mg/dL — ABNORMAL LOW (ref >39.00)
LDL Cholesterol: 101 mg/dL — ABNORMAL HIGH (ref 0–99)
Total CHOL/HDL Ratio: 5

## 2010-10-14 LAB — BASIC METABOLIC PANEL
CO2: 27 mEq/L (ref 19–32)
Calcium: 8.7 mg/dL (ref 8.4–10.5)
Chloride: 108 mEq/L (ref 96–112)
Creatinine, Ser: 1.1 mg/dL (ref 0.4–1.5)
Glucose, Bld: 130 mg/dL — ABNORMAL HIGH (ref 70–99)
Sodium: 141 mEq/L (ref 135–145)

## 2010-10-14 NOTE — Assessment & Plan Note (Signed)
Patient's BP has been very difficult to control.  Did not tolerate HCTZ.  Did not tolerate hydralzine. I would keep him on the current regimen.

## 2010-10-14 NOTE — Assessment & Plan Note (Signed)
No change.  Will set up for fasting lipid panel

## 2010-10-16 ENCOUNTER — Other Ambulatory Visit: Payer: Medicare Other

## 2010-10-17 ENCOUNTER — Ambulatory Visit: Payer: Medicare Other | Admitting: Internal Medicine

## 2010-10-24 LAB — CBC
HCT: 38.1 — ABNORMAL LOW
Platelets: 264
RDW: 13.5
WBC: 5.4

## 2010-10-24 LAB — COMPREHENSIVE METABOLIC PANEL
ALT: 16
AST: 16
Albumin: 3.8
Alkaline Phosphatase: 54
BUN: 14
Chloride: 107
Potassium: 4.4
Sodium: 136
Total Bilirubin: 0.7
Total Protein: 6.4

## 2010-10-27 ENCOUNTER — Telehealth: Payer: Self-pay | Admitting: Internal Medicine

## 2010-10-27 DIAGNOSIS — E7800 Pure hypercholesterolemia, unspecified: Secondary | ICD-10-CM

## 2010-10-27 MED ORDER — SIMVASTATIN 20 MG PO TABS: 20.0000 mg | ORAL_TABLET | Freq: Every evening | ORAL | Status: DC

## 2010-10-27 NOTE — Telephone Encounter (Signed)
[name] calling lab results, got call re this last week but was out of town and couldn't understand

## 2010-10-27 NOTE — Telephone Encounter (Signed)
Called patient's wife with results. See result note.

## 2010-12-12 ENCOUNTER — Ambulatory Visit (INDEPENDENT_AMBULATORY_CARE_PROVIDER_SITE_OTHER): Payer: Medicare Other | Admitting: Internal Medicine

## 2010-12-12 ENCOUNTER — Encounter: Payer: Self-pay | Admitting: Internal Medicine

## 2010-12-12 DIAGNOSIS — E785 Hyperlipidemia, unspecified: Secondary | ICD-10-CM

## 2010-12-12 DIAGNOSIS — Z8546 Personal history of malignant neoplasm of prostate: Secondary | ICD-10-CM

## 2010-12-12 DIAGNOSIS — I10 Essential (primary) hypertension: Secondary | ICD-10-CM

## 2010-12-12 DIAGNOSIS — G8929 Other chronic pain: Secondary | ICD-10-CM

## 2010-12-12 NOTE — Progress Notes (Signed)
  Subjective:    Patient ID: [name], male    DOB: 1929-04-19, 77 y.o.   MRN: [id number]  HPI  New patient to me but known to our practice, prior patient of Dr. Charmian Muff.  Establishing care at [facility] today  Reviewed chronic medical issues: Chronic pain syndrome. 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 Patient minimizes symptoms but wife confirms same. Patient reports pain related to left shoulder and low back. Also follows with orthopedics when necessary for steroid injections as needed  Hypertension. Chronically difficult control with multiple med intolerances. Also follows with cardiology for same. Reports compliance with ongoing treatment. Denies adverse side effects of current therapies  Dyslipidemia. On low-dose statin. history of stroke and left CEA. the patient reports compliance with medication(s) as prescribed. Denies adverse side effects.  History of prostate cancer. Now chronic urinary incontinence related to treatments. Follows with urology Dr. McDermond for same. Also notes transient hematuria following radiation therapy.  No recurrent issues with bleeding since cauterization  Past Medical History  Diagnosis Date  . HTN (hypertension)   . 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 History of prostate cancer   . Esophageal stricture     benigh distal  . Gastritis   . Esophagitis     grade 2  . Diverticulosis   . GERD (gastroesophageal reflux disease)   . Hemorrhoids   . Allergic rhinitis     Review of Systems  Constitutional: Negative for fever, fatigue and unexpected weight change.  Respiratory: Negative for cough and shortness of breath.   Cardiovascular: Negative for chest pain and leg swelling.  Musculoskeletal: Positive for back pain (chronic). Negative for joint swelling.       Objective:   Physical Exam BP 160/58  Pulse 69  Temp(Src) 97.5 F (36.4 C) (Oral)  Resp 16  Ht 5\' 11"  (1.803 m)  Wt 76.658 kg (169  lb)  BMI 23.57 kg/m2  SpO2 97% Wt Readings from Last 3 Encounters:  12/12/10 76.658 kg (169 lb)  10/13/10 79.888 kg (176 lb 1.9 oz)  09/30/10 78.926 kg (174 lb)   Constitutional:  He appears well-developed and well-nourished. No distress. Wife in room for history. Neck: Normal range of motion. Neck supple. No JVD present. No thyromegaly present.  Cardiovascular: Normal rate, regular rhythm and normal heart sounds.  No murmur heard. no BLE edema Pulmonary/Chest: Effort normal and breath sounds normal. No respiratory distress. no wheezes.  Lab Results  Component Value Date   WBC 15.0* 02/11/2010   HGB 9.9* 02/11/2010   HCT 30.0* 02/11/2010   PLT 264 02/11/2010   GLUCOSE 130* 10/14/2010   CHOL 162 10/14/2010   TRIG 129.0 10/14/2010   HDL 35.60* 10/14/2010   LDLCALC 101* 10/14/2010   ALT 13 02/06/2010   AST 19 02/06/2010   NA 141 10/14/2010   K 4.5 10/14/2010   CL 108 10/14/2010   CREATININE 1.1 10/14/2010   BUN 20 10/14/2010   CO2 27 10/14/2010   TSH 1.34 12/11/2008   PSA 0.02* 12/11/2008   INR 0.97 02/06/2010        Assessment & Plan:  See problem list. Medications and labs reviewed today.  Time spent with pt/family today 25 minutes, greater than 50% time spent counseling patient on chronic pain issues and history of substance abuse, suboptimal blood pressure control and and medication review. Also review of prior records

## 2010-12-12 NOTE — Patient Instructions (Signed)
It was good to see you today. We have reviewed your prior records including labs and tests today Medications reviewed, no changes at this time. If you need a referral to a pain clinic for your symptoms, please let me know - but I will not prescribe narcotic medications for your chronic pain symptoms  Please schedule followup in 6 months, call sooner if problems.

## 2010-12-13 ENCOUNTER — Encounter: Payer: Self-pay | Admitting: Internal Medicine

## 2010-12-13 NOTE — Assessment & Plan Note (Signed)
BP Readings from Last 3 Encounters:  12/12/10 160/58  10/13/10 169/73  09/30/10 170/72   Suboptimal control, chronic.  Review of prior intolerance of hydrochlorothiazide and hydralazine continue present plan and medications and monitor.

## 2010-12-13 NOTE — Assessment & Plan Note (Signed)
History seed implant treatment> follows with urology for same Reports bleeding complication following radiation, also persisting chronic urinary incontinence History reviewed, no changes recommended today.

## 2010-12-13 NOTE — Assessment & Plan Note (Signed)
On low-dose simvastatin, reasonable control reviewing last lipids. No history of stroke in 2001 and left CEA in 2002 The current medical regimen is effective;  continue present plan and medications.

## 2010-12-13 NOTE — Assessment & Plan Note (Signed)
Chronic pain syndrome related to back and left shoulder. Prior steroid injections by orthopedics Dr. Eulah Pont with limited relief Reviewed prior history of substance abuse including alcohol and narcotics. Wife reviews and confirms same. I explained to patient we would help with his symptoms through alternative management and avoid narcotic use. Can consider referral to pain clinic in future if needed.

## 2010-12-29 ENCOUNTER — Other Ambulatory Visit (INDEPENDENT_AMBULATORY_CARE_PROVIDER_SITE_OTHER): Payer: Medicare Other | Admitting: *Deleted

## 2010-12-29 DIAGNOSIS — E7800 Pure hypercholesterolemia, unspecified: Secondary | ICD-10-CM

## 2010-12-29 LAB — LIPID PANEL
HDL: 39.5 mg/dL (ref >39.00)
LDL Cholesterol: 100 mg/dL — ABNORMAL HIGH (ref 0–99)
Total CHOL/HDL Ratio: 4
Triglycerides: 104 mg/dL (ref 0.0–149.0)
VLDL: 20.8 mg/dL (ref 0.0–40.0)

## 2010-12-31 ENCOUNTER — Other Ambulatory Visit: Payer: Self-pay | Admitting: *Deleted

## 2010-12-31 ENCOUNTER — Telehealth: Payer: Self-pay | Admitting: Internal Medicine

## 2010-12-31 DIAGNOSIS — E7800 Pure hypercholesterolemia, unspecified: Secondary | ICD-10-CM

## 2010-12-31 MED ORDER — ATORVASTATIN CALCIUM 40 MG PO TABS: 40.0000 mg | ORAL_TABLET | Freq: Every day | ORAL | Status: DC

## 2010-12-31 NOTE — Telephone Encounter (Signed)
[name] calling re lab results °

## 2010-12-31 NOTE — Telephone Encounter (Signed)
Patient aware of results and MD's recommendations. Patient will start Lipitor 40 mg once a day. Labs are scheduled in 3 months for Fasting lipids and AST, patient aware.

## 2011-02-08 ENCOUNTER — Other Ambulatory Visit: Payer: Self-pay | Admitting: Family Medicine

## 2011-03-02 ENCOUNTER — Other Ambulatory Visit: Payer: Self-pay | Admitting: Internal Medicine

## 2011-03-26 ENCOUNTER — Telehealth: Payer: Self-pay | Admitting: Internal Medicine

## 2011-03-26 NOTE — Telephone Encounter (Signed)
New Msg: Pt wife calling wanting to speak with nurse regarding pt having steroid shots in back for pain. Please return pt call to discuss further.

## 2011-03-26 NOTE — Telephone Encounter (Signed)
Tried home and [employer] number, msg left we will call back later.

## 2011-03-27 NOTE — Telephone Encounter (Signed)
New problem:  Per [name], status of cardiac clearance & plavix need to be hold today.

## 2011-03-27 NOTE — Telephone Encounter (Signed)
FU Call: Pt wife calling wanting to speak with nurse regarding pt surgical clearance and need to hold plavix prior to steroid injection in pt back. Please return pt call to discuss further.

## 2011-03-27 NOTE — Telephone Encounter (Signed)
Called Dr.Murphy/[facility] office and advised per Dr.Ross that it is OK to hold Plavix prior to procedure. Also faxed note to them at [phone].  Called patient's wife and she is aware of above information.

## 2011-03-30 ENCOUNTER — Encounter: Payer: Self-pay | Admitting: Vascular Surgery

## 2011-03-31 ENCOUNTER — Ambulatory Visit (INDEPENDENT_AMBULATORY_CARE_PROVIDER_SITE_OTHER): Payer: Medicare Other | Admitting: Neurosurgery

## 2011-03-31 ENCOUNTER — Other Ambulatory Visit (INDEPENDENT_AMBULATORY_CARE_PROVIDER_SITE_OTHER): Payer: Medicare Other

## 2011-03-31 ENCOUNTER — Encounter: Payer: Self-pay | Admitting: Neurosurgery

## 2011-03-31 ENCOUNTER — Ambulatory Visit (INDEPENDENT_AMBULATORY_CARE_PROVIDER_SITE_OTHER): Payer: Medicare Other | Admitting: Vascular Surgery

## 2011-03-31 VITALS — BP 152/66 | HR 63 | Resp 16 | Ht 71.0 in | Wt 178.9 lb

## 2011-03-31 DIAGNOSIS — I6529 Occlusion and stenosis of unspecified carotid artery: Secondary | ICD-10-CM

## 2011-03-31 DIAGNOSIS — Z48812 Encounter for surgical aftercare following surgery on the circulatory system: Secondary | ICD-10-CM

## 2011-03-31 DIAGNOSIS — I779 Disorder of arteries and arterioles, unspecified: Secondary | ICD-10-CM | POA: Insufficient documentation

## 2011-03-31 DIAGNOSIS — E7800 Pure hypercholesterolemia, unspecified: Secondary | ICD-10-CM

## 2011-03-31 LAB — LIPID PANEL: Total CHOL/HDL Ratio: 3

## 2011-03-31 NOTE — Progress Notes (Signed)
Carotid performed 03/31/2011 @ [facility]

## 2011-03-31 NOTE — Progress Notes (Signed)
[facility] [place] HISTORY AND PHYSICAL   CC: Dr. Felicity Coyer Referring Physician: Dr. Scotty Court  History of Present Illness: This is a 78 year old male patient of Dr. Arbie Cookey who was seen in followup for bilateral carotid duplex today, one year status post right CEA 02/10/2010. Since that time the patient has been asymptomatic, no signs of stroke including paresthesias hemiparesis or amaurosis fugax.  Past Medical History  Diagnosis Date  . HTN (hypertension)   . 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 Esophageal stricture     Distal, benign  . Diverticulosis   . GERD (gastroesophageal reflux disease)   . Hemorrhoids   . Allergic rhinitis   . Prostate cancer 2000    Seed XRT  . Dyslipidemia   . Chronic pain syndrome     Left shoulder, back  . Peripheral vascular disease     Left CEA 2002  . Asthma     ROS: [x]  Positive   [ ]  Denies    General: [ ]  Weight loss, [ ]  Fever, [ ]  chills Neurologic: [ ]  Dizziness, [ ]  Blackouts, [ ]  Seizure [ ]  Stroke, [ ]  "Mini stroke", [ ]  Slurred speech, [ ]  Temporary blindness; [ ]  weakness in arms or legs, [ ]  Hoarseness Cardiac: [ ]  Chest pain/pressure, [ ]  Shortness of breath at rest [ ]  Shortness of breath with exertion, [ ]  Atrial fibrillation or irregular heartbeat Vascular: [ ]  Pain in legs with walking, [ ]  Pain in legs at rest, [ ]  Pain in legs at night,  [ ]  Non-healing ulcer, [ ]  Blood clot in vein/DVT,   Pulmonary: [ ]  Home oxygen, [ ]  Productive cough, [ ]  Coughing up blood, [ ]  Asthma,  [ ]  Wheezing Musculoskeletal:  [ ]  Arthritis, [ ]  Low back pain, [ ]  Joint pain Hematologic: [ ]  Easy Bruising, [ ]  Anemia; [ ]  Hepatitis Gastrointestinal: [ ]  Blood in stool, [ ]  Gastroesophageal Reflux/heartburn, [ ]  Trouble swallowing Urinary: [ ]  chronic Kidney disease, [ ]  on HD - [ ]  MWF or [ ]  TTHS, [ ]  Burning with urination, [ ]  Difficulty urinating Skin: [ ]  Rashes, [ ]  Wounds Psychological: [ ]  Anxiety, [ ]   Depression   Social History History  Substance Use Topics  . Smoking status: Former Smoker    Types: Cigarettes    Quit date: 01/12/1978  . Smokeless tobacco: Current User    Types: Chew  . Alcohol Use: No     Prior alcoholic, sober since 2002    Family History Family History  Problem Relation Age of Onset  . Lung disease    . Heart attack    . Cancer      Allergies  Allergen Reactions  . Codeine   . Oxycodone-Acetaminophen     Current Outpatient Prescriptions  Medication Sig Dispense Refill  . amLODipine (NORVASC) 10 MG tablet Take 1 tablet (10 mg total) by mouth daily.  90 tablet  2  . aspirin 325 MG tablet Take 325 mg by mouth daily.        . cloNIDine (CATAPRES) 0.2 MG tablet Take 0.2 mg by mouth 3 (three) times daily. 2 or 3 x a day       . clopidogrel (PLAVIX) 75 MG tablet Take 75 mg by mouth daily.        [name] Kitchen gabapentin (NEURONTIN) 300 MG capsule take 1 capsule by mouth three times a day  90 capsule  10  . methyldopa (  ALDOMET) 500 MG tablet Take 500 mg by mouth 2 (two) times daily.        [name] Kitchen omeprazole (PRILOSEC) 40 MG capsule take 1 capsule by mouth once daily  30 capsule  1  . polyethylene glycol (MIRALAX / GLYCOLAX) packet Take 17 g by mouth as needed.       . senna-docusate (SENOKOT-S) 8.6-50 MG per tablet Take 1 tablet by mouth as needed.        . simvastatin (ZOCOR) 20 MG tablet Take 1 tablet (20 mg total) by mouth every evening.  30 tablet  6  . atorvastatin (LIPITOR) 40 MG tablet Take 1 tablet (40 mg total) by mouth daily.  30 tablet  11  . valsartan (DIOVAN) 160 MG tablet Take 160 mg by mouth daily.          Physical Examination  Filed Vitals:   03/31/11 1410  BP: 152/66  Pulse: 63  Resp: 16    Body mass index is 24.95 kg/(m^2).  General:  WDWN in NAD Gait: Normal HEENT: WNL Eyes: Pupils equal Pulmonary: normal non-labored breathing , without Rales, rhonchi,  wheezing Cardiac: RRR, without  Murmurs, rubs or gallops; Abdomen: soft, NT, no  masses Skin: no rashes, ulcers noted  Vascular Exam Pulses: The patient has 1+ bilateral radial pulses. Carotid bruits negative bilaterally Extremities without ischemic changes, no Gangrene , no cellulitis; no open wounds;  Musculoskeletal: no muscle wasting or atrophy   Neurologic: A&O X 3; Appropriate Affect ; SENSATION: normal; MOTOR FUNCTION:  moving all extremities equally. Speech is fluent/normal  Non-Invasive Vascular Imaging CAROTID DUPLEX 03/31/2011  Right ICA 20 - 39 % stenosis Left ICA 20 - 39 % stenosis   ASSESSMENT/PLAN: A 78 year old male patient doing well one year status post right CEA with a right ICA end-diastolic of 9 left ICA end-diastolic of 20. The patient will followup here with repeat bilateral carotid duplex in one year. The patient and his wife also tell me that since Dr. Scotty Court has retired there are now with Dr. Felicity Coyer as thier new primary care physician and have appointments in May of this year. Their questions were encouraged and answered.  Lauree Chandler ANP  Clinic MD Dr. Arbie Cookey

## 2011-04-02 NOTE — Procedures (Unsigned)
CAROTID DUPLEX EXAM  INDICATION:  Carotid stenosis  HISTORY: Diabetes:  No Cardiac:  No Hypertension:  Yes Smoking:  Previous Previous Surgery:  Right carotid endarterectomy 02/10/2010 CV History:  Asymptomatic Amaurosis Fugax No, Paresthesias No, Hemiparesis No                                      RIGHT             LEFT Brachial systolic pressure:         152               150 Brachial Doppler waveforms:         WNL               WNL Vertebral direction of flow:        Antegrade         Retrograde DUPLEX VELOCITIES (cm/sec) CCA peak systolic                   85                99 ECA peak systolic                   245               153 ICA peak systolic                   78                85 ICA end diastolic                   9                 20 PLAQUE MORPHOLOGY:                  Heterogeneous     Heterogeneous PLAQUE AMOUNT:                      Mild              Mild PLAQUE LOCATION:                    CCA/ICA/ECA       CCA/ICA/ECA  IMPRESSION: 1. Right internal carotid artery is patent with history of     endarterectomy, no hyperplasia or hemodynamically significant     plaque visualized. 2. Bilateral external carotid artery stenosis present. 3. Left internal carotid artery stenosis present in the 1%-39% range. 4. Right vertebral artery is patent and antegrade. 5. Left vertebral artery is retrograde.  Left subclavian is patent     with peak systolic velocity of 227 cm/s. 6. Minimal change is present since previous study on 09/30/2010.  ___________________________________________ Larina Earthly, M.D.  SH/MEDQ  D:  03/31/2011  T:  03/31/2011  Job:  [id number]

## 2011-04-15 ENCOUNTER — Telehealth: Payer: Self-pay | Admitting: Internal Medicine

## 2011-04-15 NOTE — Telephone Encounter (Signed)
New msg [name] wanted to discuss his lab work results Please call back

## 2011-04-15 NOTE — Telephone Encounter (Signed)
Spoke with wife and gave her the results again and will mail a copy

## 2011-04-21 ENCOUNTER — Other Ambulatory Visit: Payer: Self-pay | Admitting: Family Medicine

## 2011-04-24 ENCOUNTER — Encounter (HOSPITAL_COMMUNITY): Payer: Self-pay | Admitting: Emergency Medicine

## 2011-04-24 ENCOUNTER — Emergency Department (INDEPENDENT_AMBULATORY_CARE_PROVIDER_SITE_OTHER)
Admission: EM | Admit: 2011-04-24 | Discharge: 2011-04-24 | Disposition: A | Discharge status: Home / Self Care | Payer: Medicare Other | Source: Home / Self Care

## 2011-04-24 ENCOUNTER — Telehealth: Payer: Self-pay | Admitting: Internal Medicine

## 2011-04-24 DIAGNOSIS — M549 Dorsalgia, unspecified: Secondary | ICD-10-CM

## 2011-04-24 DIAGNOSIS — G8929 Other chronic pain: Secondary | ICD-10-CM

## 2011-04-24 DIAGNOSIS — H6120 Impacted cerumen, unspecified ear: Secondary | ICD-10-CM

## 2011-04-24 MED ORDER — HYDROCODONE-ACETAMINOPHEN 5-325 MG PO TABS: 1.0000 | ORAL_TABLET | Freq: Four times a day (QID) | ORAL | Status: AC | PRN

## 2011-04-24 NOTE — ED Notes (Addendum)
PT HERE WITH RIGHT EAR CLOGGING AND RINGING UNRELIEVED BY EAR DROPS AND IRRIGATION LAST NIGHT.SX STARTED X 2 WEEKS AGO.DENIES BLURRY VISIO  OR DIZZINESS.PT STATES IT FEELS LIKE THE OCEAN IN EAR.PT ALSO REPORTS OF LOWER BACK PAIN THAT HAS BEEN ONGOING.

## 2011-04-24 NOTE — ED Provider Notes (Signed)
History     CSN: [id number]  Arrival date & time 04/24/11  1417   None     Chief Complaint  Patient presents with  . Ear Fullness  . Back Pain    (Consider location/radiation/quality/duration/timing/severity/associated sxs/prior treatment) HPI Comments: Patient presents today with right ear pressure and decreased hearing. He has tried ear drops and flushing his ear at home without any relief. There has been no drainage from the ear, nasal congestion, sore throat or fever. Patient also states that he ran out of his pain medication one week ago. He has chronic left lower back pain with radiation to his left posterior thigh. He has seen Dr. Eulah Pont in the past for this. His pain is unchanged. He has had no recent injury. His wife states that he had an appointment for medication refill that they had to reschedule because he was going to be out of town and therefore has run out.    Past Medical History  Diagnosis Date  . HTN (hypertension)   . 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 Esophageal stricture     Distal, benign  . Diverticulosis   . GERD (gastroesophageal reflux disease)   . Hemorrhoids   . Allergic rhinitis   . Prostate cancer 2000    Seed XRT  . Dyslipidemia   . Chronic pain syndrome     Left shoulder, back  . Peripheral vascular disease     Left CEA 2002  . Asthma     Past Surgical History  Procedure Date  . Back surgery 8/07    cervical spine  . Back surgery     lumbar  . Appendectomy   . Cataract extraction   . Insertion prostate radiation seed 2000  . Carotid endarterectomy 2002    L cea    Family History  Problem Relation Age of Onset  . Lung disease    . Heart attack    . Cancer      History  Substance Use Topics  . Smoking status: Former Smoker    Types: Cigarettes    Quit date: 01/12/1978  . Smokeless tobacco: Current User    Types: Chew  . Alcohol Use: No     Prior alcoholic, sober since 2002      Review of Systems  Constitutional:  Negative for fever, chills and unexpected weight change.  HENT: Negative for congestion, sore throat, rhinorrhea and ear discharge.   Respiratory: Negative for cough.   Musculoskeletal: Positive for back pain.    Allergies  Codeine and Oxycodone-acetaminophen  Home Medications   Current Outpatient Rx  Name Route Sig Dispense Refill  . AMLODIPINE BESYLATE 10 MG PO TABS Oral Take 1 tablet (10 mg total) by mouth daily. 90 tablet 2  . ASPIRIN 325 MG PO TABS Oral Take 325 mg by mouth daily.      . ATORVASTATIN CALCIUM 40 MG PO TABS Oral Take 1 tablet (40 mg total) by mouth daily. 30 tablet 11  . CLONIDINE HCL 0.2 MG PO TABS Oral Take 0.2 mg by mouth 3 (three) times daily. 2 or 3 x a day     . CLOPIDOGREL BISULFATE 75 MG PO TABS Oral Take 75 mg by mouth daily.      [name] Kitchen GABAPENTIN 300 MG PO CAPS  take 1 capsule by mouth three times a day 90 capsule 10  . HYDROCODONE-ACETAMINOPHEN 5-325 MG PO TABS Oral Take 1 tablet by mouth every 6 (six) hours as needed for  pain. 10 tablet 0  . METHYLDOPA 500 MG PO TABS Oral Take 500 mg by mouth 2 (two) times daily.      [name] Kitchen OMEPRAZOLE 40 MG PO CPDR  take 1 capsule by mouth once daily 30 capsule 1    NEED APPT FOR FURTHER REFILLS  . POLYETHYLENE GLYCOL 3350 PO PACK Oral Take 17 g by mouth as needed.     Bernadette Hoit SODIUM 8.6-50 MG PO TABS Oral Take 1 tablet by mouth as needed.      [name] Kitchen SIMVASTATIN 20 MG PO TABS Oral Take 1 tablet (20 mg total) by mouth every evening. 30 tablet 6    BP 175/71  Pulse 70  Temp(Src) 98.7 F (37.1 C) (Oral)  Resp 18  SpO2 97%  Physical Exam  Nursing note and vitals reviewed. Constitutional: He appears well-developed and well-nourished. No distress.  HENT:  Head: Normocephalic and atraumatic.  Right Ear: External ear normal.  Left Ear: External ear normal.  Nose: Nose normal.  Mouth/Throat: Uvula is midline, oropharynx is clear and moist and mucous membranes are normal. No oropharyngeal exudate, posterior  oropharyngeal edema or posterior oropharyngeal erythema.       Bilat EAC obstructed with cerumen. TMs not visualized.  Neck: Neck supple.  Cardiovascular: Normal rate, regular rhythm and normal heart sounds.   Pulmonary/Chest: Effort normal and breath sounds normal. No respiratory distress.  Musculoskeletal:       Lumbar back: He exhibits tenderness.       Back:  Lymphadenopathy:    He has no cervical adenopathy.  Neurological: He is alert.  Reflex Scores:      Patellar reflexes are 2+ on the right side and 2+ on the left side. Skin: Skin is warm and dry.  Psychiatric: He has a normal mood and affect.    ED Course  Procedures (including critical care time)  Labs Reviewed - No data to display No results found.   1. Cerumen impaction   2. Chronic back pain       MDM  [place] narcotic registry reviewed.  After ear irrg, bilat TMs and EAC neg. Pt reports relief of ear pressure and improved hearing.        Melody Comas, PA 04/24/11 1620  Melody Comas, PA 04/24/11 1620  Melody Comas, [place] 04/24/11 1624

## 2011-04-24 NOTE — Telephone Encounter (Signed)
New problem:  Per pt wife calling. Please clarify  If patient should be  taken zocor, and lipitor

## 2011-04-24 NOTE — Telephone Encounter (Signed)
Wife calling wanting to know which med to take for cholesterol--advised to take lipitor--dr ross had him on zocor but switched due to better results with lipitor--wife agrees--nt

## 2011-04-24 NOTE — Discharge Instructions (Signed)
Cerumen Impaction A cerumen impaction is when the wax in your ear forms a plug. This plug usually causes reduced hearing. Sometimes it also causes an earache or dizziness. Removing a cerumen impaction can be difficult and painful. The wax sticks to the ear canal. The canal is sensitive and bleeds easily. If you try to remove a heavy wax buildup with a cotton tipped swab, you may push it in further. Irrigation with water, suction, and small ear curettes may be used to clear out the wax. If the impaction is fixed to the skin in the ear canal, ear drops may be needed for a few days to loosen the wax. People who build up a lot of wax frequently can use ear wax removal products available in your local drugstore. SEEK MEDICAL CARE IF:  You develop an earache, increased hearing loss, or marked dizziness. Document Released: 02/06/2004 Document Revised: 12/18/2010 Document Reviewed: 03/28/2009 [facility] Patient Information 2012 [place], [place].  Chronic Pain Discharge Instructions  Emergency care providers appreciate that many patients coming to [place] are in severe pain and we wish to address their pain in the safest, most responsible manner.  It is important to recognize however, that the proper treatment of chronic pain differs from that of the pain of injuries and acute illnesses.  Our goal is to provide quality, safe, personalized care and we thank you for giving [place] the opportunity to serve you. The use of narcotics and related agents for chronic pain syndromes may lead to additional physical and psychological problems.  Nearly as many people die from prescription narcotics each year as die from car crashes.  Additionally, this risk is increased if such prescriptions are obtained from a variety of sources.  Therefore, only your primary care physician or a pain management specialist is able to safely treat such syndromes with narcotic medications long-term.    Documentation revealing such prescriptions have been  sought from multiple sources may prohibit [place] from providing a refill or different narcotic medication.  Your name may be checked first through the [facility] Controlled Substances Reporting System.  This database is a record of controlled substance medication prescriptions that the patient has received.  This has been established by [facility] in an effort to eliminate the dangerous, and often life threatening, practice of obtaining multiple prescriptions from different medical providers.   If you have a chronic pain syndrome (i.e. chronic headaches, recurrent back or neck pain, dental pain, abdominal or pelvis pain without a specific diagnosis, or neuropathic pain such as fibromyalgia) or recurrent visits for the same condition without an acute diagnosis, you may be treated with non-narcotics and other non-addictive medicines.  Allergic reactions or negative side effects that may be reported by a patient to such medications will not typically lead to the use of a narcotic analgesic or other controlled substance as an alternative.   Patients managing chronic pain with a personal physician should have provisions in place for breakthrough pain.  If you are in crisis, you should call your physician.  If your physician directs you to the emergency department, please have the doctor call and speak to our attending physician concerning your care.   When patients come to the Emergency Department (ED) with acute medical conditions in which the Emergency Department physician feels appropriate to prescribe narcotic or sedating pain medication, the physician will prescribe these in very limited quantities.  The amount of these medications will last only until you can see your primary care physician in his/her  office.  Any patient who returns to the ED seeking refills should expect only non-narcotic pain medications.   In the event of an acute medical condition exists and the emergency physician feels it is  necessary that the patient be given a narcotic or sedating medication -  a responsible adult driver should be present in the room prior to the medication being given by the nurse.   Prescriptions for narcotic or sedating medications that have been lost, stolen or expired will not be refilled in the Emergency Department.    Patients who have chronic pain may receive non-narcotic prescriptions until seen by their primary care physician.  It is every patient's personal responsibility to maintain active prescriptions with his or her primary care physician or specialist.

## 2011-04-28 ENCOUNTER — Other Ambulatory Visit: Payer: Self-pay | Admitting: Internal Medicine

## 2011-04-28 MED ORDER — CLOPIDOGREL BISULFATE 75 MG PO TABS: 75.0000 mg | ORAL_TABLET | Freq: Every day | ORAL | Status: DC

## 2011-04-29 NOTE — ED Provider Notes (Signed)
Medical screening examination/treatment/procedure(s) were performed by non-physician practitioner and as supervising physician I was immediately available for consultation/collaboration.  Raynald Blend, MD 04/29/11 1735

## 2011-05-25 ENCOUNTER — Other Ambulatory Visit: Payer: Self-pay | Admitting: Internal Medicine

## 2011-05-28 ENCOUNTER — Telehealth: Payer: Self-pay | Admitting: Internal Medicine

## 2011-05-28 NOTE — Telephone Encounter (Signed)
Jae Dire with [facility] calling for status on fax to stop plavix and asa for 5 days for epidural inj

## 2011-05-28 NOTE — Telephone Encounter (Signed)
Form faxed to [employer].

## 2011-05-29 NOTE — Telephone Encounter (Signed)
F/u   Patient wife request return call concerning questions regarding injection, she can be reached at [phone]

## 2011-05-29 NOTE — Telephone Encounter (Signed)
Called patient's wife and advised that he was cleared to hold Plavix and ASA prior to spinal injection and that form was faxed yesterday. She states that he is taking Tramadol prn for pain that helps and he might not need to have another injection since the Tramadol is working. She also states that she wanted to let Dr.Ross know that back in the 1990's he was admitted to a rehab facility for alcohol abuse. Will pass along message.

## 2011-06-03 ENCOUNTER — Other Ambulatory Visit (INDEPENDENT_AMBULATORY_CARE_PROVIDER_SITE_OTHER): Payer: Medicare Other

## 2011-06-03 ENCOUNTER — Ambulatory Visit (INDEPENDENT_AMBULATORY_CARE_PROVIDER_SITE_OTHER): Payer: Medicare Other | Admitting: Internal Medicine

## 2011-06-03 ENCOUNTER — Encounter: Payer: Self-pay | Admitting: Internal Medicine

## 2011-06-03 VITALS — BP 126/76 | HR 76 | Temp 98.6°F | Ht 71.0 in | Wt 173.8 lb

## 2011-06-03 DIAGNOSIS — R110 Nausea: Secondary | ICD-10-CM

## 2011-06-03 DIAGNOSIS — G8929 Other chronic pain: Secondary | ICD-10-CM

## 2011-06-03 DIAGNOSIS — I10 Essential (primary) hypertension: Secondary | ICD-10-CM

## 2011-06-03 DIAGNOSIS — R5381 Other malaise: Secondary | ICD-10-CM

## 2011-06-03 DIAGNOSIS — R5383 Other fatigue: Secondary | ICD-10-CM

## 2011-06-03 LAB — CBC WITH DIFFERENTIAL/PLATELET
Basophils Absolute: 0 10*3/uL (ref 0.0–0.1)
Lymphocytes Relative: 11.1 % — ABNORMAL LOW (ref 12.0–46.0)
Lymphs Abs: 0.9 10*3/uL (ref 0.7–4.0)
Monocytes Relative: 6.2 % (ref 3.0–12.0)
Platelets: 307 10*3/uL (ref 150.0–400.0)
RDW: 15 % — ABNORMAL HIGH (ref 11.5–14.6)

## 2011-06-03 LAB — BASIC METABOLIC PANEL
BUN: 25 mg/dL — ABNORMAL HIGH (ref 6–23)
Calcium: 9 mg/dL (ref 8.4–10.5)
Creatinine, Ser: 1.1 mg/dL (ref 0.4–1.5)
GFR: 69.22 mL/min (ref >60.00)
Glucose, Bld: 105 mg/dL — ABNORMAL HIGH (ref 70–99)

## 2011-06-03 LAB — HEPATIC FUNCTION PANEL
ALT: 9 U/L (ref 0–53)
AST: 15 U/L (ref 0–37)
Bilirubin, Direct: 0 mg/dL (ref 0.0–0.3)
Total Bilirubin: 0.4 mg/dL (ref 0.3–1.2)

## 2011-06-03 MED ORDER — TRAMADOL HCL 50 MG PO TABS: 50.0000 mg | ORAL_TABLET | Freq: Three times a day (TID) | ORAL | Status: DC | PRN

## 2011-06-03 NOTE — Assessment & Plan Note (Signed)
Chronic pain syndrome related to back and left shoulder. Prior steroid injections by orthopedics ([facility]) with limited relief Reviewed prior history of substance abuse including alcohol and narcotics. Wife reviews and confirms same. I explained to patient we would help with his symptoms through alternative management and avoid narcotic use. Continue tramadol prn and steroid shots as needed Can consider referral to pain clinic in future if needed. 

## 2011-06-03 NOTE — Progress Notes (Signed)
  Subjective:    Patient ID: [name], male    DOB: 01/20/1929, 78 y.o.   MRN: [id number]  HPI  Here for follow up -reviewed chronic medical issues:  Chronic pain syndrome. 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 Patient minimizes symptoms but wife confirms same. Patient reports pain related to left shoulder and low back. Also follows with orthopedics ([place]) when necessary for steroid injections as needed - uses tramadol prn with fair relief  Hypertension. Chronically difficult control with multiple med intolerances. Also follows with cardiology for same. Reports compliance with ongoing treatment. Denies adverse side effects of current therapies  Dyslipidemia. On low-dose statin. history of stroke and left CEA. the patient reports compliance with medication(s) as prescribed. Denies adverse side effects.  History of prostate cancer. Now chronic urinary incontinence related to treatments. Follows with urology Dr. McDermond for same. Also notes transient hematuria following radiation therapy.  No recurrent issues with bleeding since cauterization  Past Medical History  Diagnosis Date  . HTN (hypertension)   . 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 Esophageal stricture     Distal, benign  . Diverticulosis   . GERD (gastroesophageal reflux disease)   . Hemorrhoids   . Allergic rhinitis   . Prostate cancer 2000    Seed XRT  . Dyslipidemia   . Chronic pain syndrome     Left shoulder, back  . Peripheral vascular disease     Left CEA 2002  . Asthma     Review of Systems  Constitutional: Positive for fatigue. Negative for fever and unexpected weight change.  Respiratory: Negative for cough and shortness of breath.   Cardiovascular: Negative for chest pain and leg swelling.  Musculoskeletal: Positive for back pain (chronic). Negative for joint swelling.       Objective:   Physical Exam  BP 160/58  Pulse 76  Temp(Src) 98.6 F (37  C) (Oral)  Ht 5\' 11"  (1.803 m)  Wt 173 lb 12.8 oz (78.835 kg)  BMI 24.24 kg/m2  SpO2 97% Wt Readings from Last 3 Encounters:  06/03/11 173 lb 12.8 oz (78.835 kg)  03/31/11 178 lb 14.4 oz (81.149 kg)  12/12/10 169 lb (76.658 kg)   Constitutional:  He appears well-developed and well-nourished. No distress. Wife in room for history. Neck: Normal range of motion. Neck supple. No JVD present. No thyromegaly present.  Cardiovascular: Normal rate, regular rhythm and normal heart sounds.  No murmur heard. no BLE edema Abd: SNTND+BS Pulmonary/Chest: Effort normal and breath sounds normal. No respiratory distress. no wheezes.  Lab Results  Component Value Date   WBC 15.0* 02/11/2010   HGB 9.9* 02/11/2010   HCT 30.0* 02/11/2010   PLT 264 02/11/2010   GLUCOSE 130* 10/14/2010   CHOL 126 03/31/2011   TRIG 111.0 03/31/2011   HDL 36.60* 03/31/2011   LDLCALC 67 03/31/2011   ALT 13 02/06/2010   AST 12 03/31/2011   NA 141 10/14/2010   K 4.5 10/14/2010   CL 108 10/14/2010   CREATININE 1.1 10/14/2010   BUN 20 10/14/2010   CO2 27 10/14/2010   TSH 1.34 12/11/2008   PSA 0.02* 12/11/2008   INR 0.97 02/06/2010        Assessment & Plan:  See problem list. Medications and labs reviewed today.  Fatigue and nausea without vomitting - nonspecific hx and exam - check labs, no tx changes recommended - follow up GI if persisting or worsening symptoms

## 2011-06-03 NOTE — Assessment & Plan Note (Signed)
BP Readings from Last 3 Encounters:  06/03/11 160/58  04/24/11 175/71  03/31/11 152/66   Suboptimal control, chronic. Review of prior intolerance of hydrochlorothiazide and hydralazine continue present plan and medications and monitor.

## 2011-06-03 NOTE — Patient Instructions (Signed)
It was good to see you today Test(s) ordered today. Your results will be called to you after review (48-72hours after test completion). If any changes need to be made, you will be notified at that time. Medications reviewed, no changes at this time. Refill on medication(s) as discussed today. Please schedule followup in 6 months, call sooner if problems.

## 2011-06-12 ENCOUNTER — Telehealth: Payer: Self-pay | Admitting: Internal Medicine

## 2011-06-12 NOTE — Telephone Encounter (Signed)
New Problem:    Patient's wife called in needing a refill of his methyldopa (ALDOMET) 500 MG tablet, amLODipine (NORVASC) 10 MG tablet, and cloNIDine (CATAPRES) 0.2 MG tablet all switched to being filled with [facility] as his primary pharmacy.  Please call back once the orders have been placed.

## 2011-06-13 ENCOUNTER — Other Ambulatory Visit: Payer: Self-pay

## 2011-06-13 MED ORDER — METHYLDOPA 500 MG PO TABS: 500.0000 mg | ORAL_TABLET | Freq: Two times a day (BID) | ORAL | Status: DC

## 2011-06-13 MED ORDER — CLONIDINE HCL 0.2 MG PO TABS: 0.2000 mg | ORAL_TABLET | Freq: Two times a day (BID) | ORAL | Status: DC

## 2011-06-13 MED ORDER — AMLODIPINE BESYLATE 10 MG PO TABS: 10.0000 mg | ORAL_TABLET | Freq: Every day | ORAL | Status: DC

## 2011-07-03 ENCOUNTER — Other Ambulatory Visit: Payer: Self-pay | Admitting: Internal Medicine

## 2011-07-03 NOTE — Telephone Encounter (Signed)
Done erx 

## 2011-07-14 ENCOUNTER — Telehealth: Payer: Self-pay | Admitting: Internal Medicine

## 2011-07-14 NOTE — Telephone Encounter (Signed)
Fu call [name] calling back again she is anxious about BP being too high. Please call her back

## 2011-07-14 NOTE — Telephone Encounter (Signed)
New msg [name] called and said his BP has been 174/78, 178/62. She said they are out of town. Please call

## 2011-07-14 NOTE — Telephone Encounter (Signed)
Called patient's wife back. They are at [facility] on vacation. He has been eating more salt than usual. Advised her to cut back on his salt intake. She states that he has not been on Diovan for a long time but not sure why. Advised that she needs to track his BP for several days and let us know results per Dr.Ross. She states that sometimes she gives him an 3rd dose of Clonidine if BP is high. Advised her to be carefull with the dosing and let us know the BP readings. Will let Dr.Ross know about above.

## 2011-07-14 NOTE — Telephone Encounter (Signed)
He had been on Diovan in the past.  I can't find why he is not taking His BP has been labile.  I would contine to track

## 2011-07-15 NOTE — Telephone Encounter (Signed)
Fu call [name] called and said that his BP 168/74. She said he went off diovan because it elevated his potassium. They are still out of town

## 2011-07-15 NOTE — Telephone Encounter (Signed)
I spoke with the pt's wife and she said that the pt's BP continues to remain elevated.  She has been giving the pt an extra dose of Clonidine as needed for increased BP.  She also wanted to make Dr Tenny Craw aware that Diovan was stopped due to an elevated potassium. They are planning on coming back home on Saturday.  I made her aware that she should continue to monitor the pt's BP.  If the pt develops any stroke symptoms then the pt should go to local ER.  The pt's wife will call back on Monday with further BP readings.

## 2011-07-17 ENCOUNTER — Telehealth: Payer: Self-pay | Admitting: Internal Medicine

## 2011-07-17 NOTE — Telephone Encounter (Signed)
Pt's BP running high, 203/60 something, went to er 7-3 and  7-4 pls advise [phone]

## 2011-07-17 NOTE — Telephone Encounter (Signed)
Left message to call back  

## 2011-07-20 ENCOUNTER — Telehealth: Payer: Self-pay | Admitting: Internal Medicine

## 2011-07-20 DIAGNOSIS — I10 Essential (primary) hypertension: Secondary | ICD-10-CM

## 2011-07-20 NOTE — Telephone Encounter (Signed)
Dr.Ross is aware that [place] his Catapres to 0.3 mg 2 times per day. Still with BP of 184/67. Dr.Ross ordered to increase Aldomet to 500mg  2 in the AM , 1 midday, and 2 at bedtime and schedule a renal ultrasound. Set for 7/10 at 11 am. All instructions given to patient's wife with verbal understanding.

## 2011-07-20 NOTE — Telephone Encounter (Signed)
[place] for [place] b/p and wants to be seen today last reading 184/67 and that was last night

## 2011-07-20 NOTE — Telephone Encounter (Signed)
See note from 07/20/2011.

## 2011-07-21 ENCOUNTER — Telehealth: Payer: Self-pay | Admitting: Internal Medicine

## 2011-07-21 NOTE — Telephone Encounter (Signed)
Spoke with pt's wife. Wife concerned because pt's BP continues to fluctuate with the medication adjustments that have recently been made--it has been 111/56, 136/58,186/88 today at various times. I have given pt an appt with [facility] tomorrow at 12:15pm after renal artery doppler. [name] will bring BP readings to appt tomorrow.

## 2011-07-21 NOTE — Telephone Encounter (Signed)
LMTCB

## 2011-07-21 NOTE — Telephone Encounter (Signed)
NA

## 2011-07-21 NOTE — Telephone Encounter (Signed)
New msg [name] calling about BP She said his BP last night was 97/58. Please call

## 2011-07-21 NOTE — Telephone Encounter (Signed)
F/u   Patient wife Randa Evens would like return call. She can be reached at [phone].

## 2011-07-21 NOTE — Telephone Encounter (Signed)
Patient returning nurse call, says she need to speak with nurse badly.

## 2011-07-22 ENCOUNTER — Encounter (INDEPENDENT_AMBULATORY_CARE_PROVIDER_SITE_OTHER): Payer: Medicare Other

## 2011-07-22 ENCOUNTER — Encounter: Payer: Self-pay | Admitting: Nurse Practitioner

## 2011-07-22 ENCOUNTER — Ambulatory Visit (INDEPENDENT_AMBULATORY_CARE_PROVIDER_SITE_OTHER): Payer: Medicare Other | Admitting: Nurse Practitioner

## 2011-07-22 VITALS — BP 110/46 | HR 74 | Ht 71.0 in | Wt 169.6 lb

## 2011-07-22 DIAGNOSIS — R739 Hyperglycemia, unspecified: Secondary | ICD-10-CM

## 2011-07-22 DIAGNOSIS — R7309 Other abnormal glucose: Secondary | ICD-10-CM

## 2011-07-22 DIAGNOSIS — I10 Essential (primary) hypertension: Secondary | ICD-10-CM

## 2011-07-22 DIAGNOSIS — I700 Atherosclerosis of aorta: Secondary | ICD-10-CM

## 2011-07-22 LAB — HEMOGLOBIN A1C: Hgb A1c MFr Bld: 6.5 % (ref 4.6–6.5)

## 2011-07-22 MED ORDER — AMLODIPINE BESYLATE 10 MG PO TABS: 10.0000 mg | ORAL_TABLET | Freq: Every day | ORAL | Status: DC

## 2011-07-22 NOTE — Patient Instructions (Addendum)
Take the Norvasc in the early afternoon  Monitor the blood pressure at home  Avoid salt  We will check an A1C today to see about your blood sugar  I will see you in 3 weeks  Call the [facility] office at [phone] if you have any questions, problems or concerns.

## 2011-07-22 NOTE — Progress Notes (Signed)
[name] Date of Birth: 08/09/1929 Medical Record #[id number]  History of Present Illness: Mr. [name] is seen today for a work in visit. He is seen for Dr. Tenny Craw. He has not been here in the office in over a year or so. No known coronary disease but has HTN. Has had difficult control. Other problems include past stroke, GERD, HLD, PVD, and prostate cancer.  He comes in today. He is here with his wife and daughter. They are all quite frustrated. Blood pressure remains quite difficult to control and is elevated primarily in the evening. He has been at the beach for the last 2 weeks. Did have extra salt according to the wife, but he denies. Does eat out when home. No swelling. Not really dizzy or lightheaded. Did have 2 falls 3 weeks ago where he says he just got tripped up. Wife thinks he was moving too fast. While at the beach, blood pressure rose to 200 systolic. He has been using extra Clonidine and has been to the ER twice while there. Apparently his blood sugar was quite high as well. No known diabetes reported. His labs from the ER visit have not been faxed here for review.  Now on higher doses of Aldomet per Dr. Tenny Craw. No chest pain or shortness of breath reported. He had a negative renal duplex earlier this morning.   Current Outpatient Prescriptions on File Prior to Visit  Medication Sig Dispense Refill  . atorvastatin (LIPITOR) 40 MG tablet Take 1 tablet (40 mg total) by mouth daily.  30 tablet  11  . cloNIDine (CATAPRES) 0.3 MG tablet Take 1 tablet (0.3 mg total) by mouth 2 (two) times daily.      . clopidogrel (PLAVIX) 75 MG tablet Take 1 tablet (75 mg total) by mouth daily.  30 tablet  5  . gabapentin (NEURONTIN) 300 MG capsule take 1 capsule by mouth three times a day  90 capsule  10  . methyldopa (ALDOMET) 500 MG tablet 2 tabs in the AM ,1 tab mid day, 2 tabs at bedtime  180 tablet  2  . omeprazole (PRILOSEC) 40 MG capsule take 1 capsule by mouth once daily  30 capsule  1  .  polyethylene glycol (MIRALAX / GLYCOLAX) packet Take 17 g by mouth as needed.       . senna-docusate (SENOKOT-S) 8.6-50 MG per tablet Take 1 tablet by mouth as needed.        . traMADol (ULTRAM) 50 MG tablet take 1 to 2 tablets by mouth every 8 hours if needed for pain (MAX 6 TABLETS PER DAY)  60 tablet  1  . DISCONTD: amLODipine (NORVASC) 10 MG tablet Take 1 tablet (10 mg total) by mouth daily.  90 tablet  3  . DISCONTD: valsartan (DIOVAN) 160 MG tablet Take 160 mg by mouth daily.          Allergies  Allergen Reactions  . Codeine   . Diovan (Valsartan)     hyperkalemia  . Hctz (Hydrochlorothiazide)     Swelling and dyspnea   . Oxycodone-Acetaminophen     Past Medical History  Diagnosis Date  . HTN (hypertension)   . 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 Esophageal stricture     Distal, benign  . Diverticulosis   . GERD (gastroesophageal reflux disease)   . Hemorrhoids   . Allergic rhinitis   . Prostate cancer 2000    Seed XRT  . Dyslipidemia   .  Chronic pain syndrome     Left shoulder, back  . Peripheral vascular disease     Left CEA 2002  . Asthma     Past Surgical History  Procedure Date  . Back surgery 8/07    cervical spine  . Back surgery     lumbar  . Appendectomy   . Cataract extraction   . Insertion prostate radiation seed 2000  . Carotid endarterectomy 2002    L cea    History  Smoking status  . Former Smoker  . Types: Cigarettes  . Quit date: 01/12/1978  Smokeless tobacco  . Current User  . Types: Chew    History  Alcohol Use No    Prior alcoholic, sober since 2002    Family History  Problem Relation Age of Onset  . Lung disease    . Heart attack    . Cancer      Review of Systems: The review of systems is per the HPI.  All other systems were reviewed and are negative.  Physical Exam: BP 110/46  Pulse 74  Ht 5\' 11"  (1.803 m)  Wt 169 lb 9.6 oz (76.93 kg)  BMI 23.65 kg/m2 Blood pressure by me in both arms is just 100/40. Patient  is very pleasant and in no acute distress. Skin is warm and dry. Color is normal.  HEENT is unremarkable. Normocephalic/atraumatic. PERRL. Sclera are nonicteric. Neck is supple. No masses. No JVD. Lungs are clear. Cardiac exam shows a regular rate and rhythm. Abdomen is soft. Extremities are without edema. Gait and ROM are intact. No gross neurologic deficits noted.  LABORATORY DATA:   Assessment / Plan:

## 2011-07-22 NOTE — Assessment & Plan Note (Signed)
His renal duplex was ok. He continues to have issues with elevated BP in the evenings. His blood pressure right now is actually low. I have switched his Norvasc from the morning to the evening. I have not added any extra medicines. He needs to cut back on his salt use. Will have them continue to monitor at home. Will see him back in about 3 weeks. Will also check an A1C today to follow up on the blood sugar issue. Patient is agreeable to this plan and will call if any problems develop in the interim.

## 2011-07-23 ENCOUNTER — Telehealth: Payer: Self-pay | Admitting: Internal Medicine

## 2011-07-23 NOTE — Telephone Encounter (Signed)
New msg [name] called and wanted to talk to Dr. Tenny Craw. Please call after [phone]

## 2011-07-23 NOTE — Telephone Encounter (Signed)
Will forward to [facility] RN with Dr Tenny Craw

## 2011-07-23 NOTE — Telephone Encounter (Signed)
Called patient's wife. She is very unhappy with her husband's BP regime. She feels that Dr.Ross needs to change his medications because he has some dizzy spells. BP today was 150/62 152/75. Advised that we don't have the official reading of his renal artery scan. She is requesting an appointment with Dr.Ross ASAP. Advised for her to bring him on 7/15 at 11am.

## 2011-07-24 ENCOUNTER — Telehealth: Payer: Self-pay | Admitting: Internal Medicine

## 2011-07-24 NOTE — Telephone Encounter (Signed)
Patient BP 200/80 wife would like to speak with a nurse now.    Warm xfer to [facility] triage

## 2011-07-24 NOTE — Telephone Encounter (Signed)
Patient's daughter called because she said pt's BP at 12:00 noon  was 210/90. Patient took Catapres 0.3 mg and Aldomet 500 mg two tablets in AM and  an extra Catapres 0.2 mg at noon at 1:00 PM BP was 200/80.  Wife would like to know what needs to be done next. Patient takes the  medication Norvasc 10 mg in the evening. Norma Fredrickson NP aware of pt's high  BP. NP recommends for pt to take Norvasc 5 mg now and the 5 mg in the afternoon for the total of 10 mg daily. At 1:30PM, According to pt's wife BP is now 178/78.  [name] aware of recommendations, and keep appointment for pt with Dr. Tenny Craw on Monday 07/27/11 at 11:00 AM.

## 2011-07-27 ENCOUNTER — Ambulatory Visit (INDEPENDENT_AMBULATORY_CARE_PROVIDER_SITE_OTHER): Payer: Medicare Other | Admitting: Internal Medicine

## 2011-07-27 ENCOUNTER — Encounter: Payer: Self-pay | Admitting: Internal Medicine

## 2011-07-27 VITALS — BP 78/50 | HR 88 | Ht 70.0 in | Wt 171.0 lb

## 2011-07-27 DIAGNOSIS — I10 Essential (primary) hypertension: Secondary | ICD-10-CM

## 2011-07-27 LAB — URINALYSIS, ROUTINE W REFLEX MICROSCOPIC
Ketones, ur: 15
Leukocytes, UA: NEGATIVE
Specific Gravity, Urine: >=1.03 (ref 1.000–1.030)
Urine Glucose: NEGATIVE
Urobilinogen, UA: 4 (ref 0.0–1.0)
pH: 5.5 (ref 5.0–8.0)

## 2011-07-27 NOTE — Patient Instructions (Signed)
Your physician recommends that you return for lab work today - urinalysis

## 2011-07-27 NOTE — Progress Notes (Signed)
HPI Patient is a 78 year old with a history of HTN.  I saw him October 2012.  BPs at that time were mainly 140s/60ss The patient was at the beach earlier this summer.  BP rose to 200.  He gegain using extra clonidine.  Went to ER twice.    Note that patient intolerant to HCTZ, hydralazine and ACE I ( increased K).   Since in ER his BP has been up and down  He does not feel bad when it is up.  Wife and he say he is taking meds though she says he may be mixing them up, not taking correctly.  She is a bit argumentative with him.  She is worried about possible stroke. He denies CP.  Breathing is OK  Is dizzy at times, is dizzy this am.  No syncope. Allergies  Allergen Reactions  . Codeine   . Diovan (Valsartan)     hyperkalemia  . Hctz (Hydrochlorothiazide)     Swelling and dyspnea   . Oxycodone-Acetaminophen     Current Outpatient Prescriptions  Medication Sig Dispense Refill  . amLODipine (NORVASC) 10 MG tablet Take 1 tablet (10 mg total) by mouth at bedtime.  90 tablet  3  . atorvastatin (LIPITOR) 40 MG tablet Take 1 tablet (40 mg total) by mouth daily.  30 tablet  11  . cloNIDine (CATAPRES) 0.3 MG tablet Take 1 tablet (0.3 mg total) by mouth 2 (two) times daily.      . clopidogrel (PLAVIX) 75 MG tablet Take 1 tablet (75 mg total) by mouth daily.  30 tablet  5  . gabapentin (NEURONTIN) 300 MG capsule take 1 capsule by mouth three times a day  90 capsule  10  . metFORMIN (GLUCOPHAGE) 500 MG tablet Take 1 tablet (500 mg total) by mouth 2 (two) times daily with a meal.      . methyldopa (ALDOMET) 500 MG tablet 2 tabs in the AM ,1 tab mid day, 2 tabs at bedtime  180 tablet  2  . omeprazole (PRILOSEC) 40 MG capsule take 1 capsule by mouth once daily  30 capsule  1  . polyethylene glycol (MIRALAX / GLYCOLAX) packet Take 17 g by mouth as needed.       . senna-docusate (SENOKOT-S) 8.6-50 MG per tablet Take 1 tablet by mouth as needed.        . traMADol (ULTRAM) 50 MG tablet take 1 to 2 tablets  by mouth every 8 hours if needed for pain (MAX 6 TABLETS PER DAY)  60 tablet  1  . DISCONTD: valsartan (DIOVAN) 160 MG tablet Take 160 mg by mouth daily.          Past Medical History  Diagnosis Date  . HTN (hypertension)     Negative renal duplex 07-22-11  . 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 Esophageal stricture     Distal, benign  . Diverticulosis   . GERD (gastroesophageal reflux disease)   . Hemorrhoids   . Allergic rhinitis   . Prostate cancer 2000    Seed XRT  . Dyslipidemia   . Chronic pain syndrome     Left shoulder, back  . Peripheral vascular disease     Left CEA 2002  . Asthma     Past Surgical History  Procedure Date  . Back surgery 8/07    cervical spine  . Back surgery     lumbar  . Appendectomy   . Cataract  extraction   . Insertion prostate radiation seed 2000  . Carotid endarterectomy 2002    L cea    Family History  Problem Relation Age of Onset  . Lung disease    . Heart attack    . Cancer      History   Social History  . Marital Status: Married    Spouse Name: N/A    Number of Children: N/A  . Years of Education: N/A   Occupational History  . retired    Social History Main Topics  . Smoking status: Former Smoker    Types: Cigarettes    Quit date: 01/12/1978  . Smokeless tobacco: Current User    Types: Chew  . Alcohol Use: No     Prior alcoholic, sober since 2002  . Drug Use: No  . Sexually Active: Not Currently   Other Topics Concern  . Not on file   Social History Narrative  . No narrative on file    Review of Systems:  All systems reviewed.  They are negative to the above problem except as previously stated.  Vital Signs: BP 78/50  Pulse 88  Ht 5\' 10"  (1.778 m)  Wt 171 lb (77.565 kg)  BMI 24.54 kg/m2  Physical Exam Patient is in NAD HEENT:  Normocephalic, atraumatic. EOMI, PERRLA.  Neck: JVP is normal. No thyromegaly. No bruits.  Lungs: clear to auscultation. No rales no wheezes.    Heart: Regular rate and rhythm. Normal S1, S2. No S3.   No significant murmurs. PMI not displaced.  Abdomen:  Supple, nontender. Normal bowel sounds. No masses. No hepatomegaly.  Extremities:   Good distal pulses throughout. No lower extremity edema.  Musculoskeletal :moving all extremities.  Neuro:   alert and oriented x3.  CN II-XII grossly intact.   Assessment and Plan:   1.  HTN.  Spent a long time (25 min) talking to wife and patient, going over meds, dispensing of meds.  Reviewed diary of BPs.  BP is Very labile.  Today it is very low, patient appears orthostatic. I question whether he is taking meds correctly. It is high at times, but I wonder if getting all of meds in or forgeting some and getting rebound.  Will hold meds now.  Check UA for specific gravity. Will call this PM to check on BP  2  HL  Continue statin  3.  Hyperglycemia.  Glucose was high in the emergency room.  He was placed on glucophage.  His glucose and Hgb A1 C here have not been bad.  With possible erratic po intake I would stop glucophage.  Will need to follow but risk is too high I think for now.

## 2011-07-28 ENCOUNTER — Telehealth: Payer: Self-pay | Admitting: Internal Medicine

## 2011-07-28 ENCOUNTER — Other Ambulatory Visit: Payer: Self-pay | Admitting: *Deleted

## 2011-07-28 DIAGNOSIS — I10 Essential (primary) hypertension: Secondary | ICD-10-CM

## 2011-07-28 NOTE — Telephone Encounter (Signed)
Spoke with daughter.  Her cell :  [phone] I told her I would get back with her.  I am perplexed by wide swings of BP Yesterday UA sugg he was dry.  BP around 80/ Later in day 220/  Feeling fine when taken  ?compliance  ?BP cuff at home that was not checked Will review.  Told her I would call back She will try to observe father for 24 hours, check BP, ensure meds taken correctly.

## 2011-07-28 NOTE — Telephone Encounter (Signed)
PTS DTR CALLING RE PT'S BP PROBLEM

## 2011-07-29 ENCOUNTER — Other Ambulatory Visit: Payer: Self-pay | Admitting: Internal Medicine

## 2011-07-30 ENCOUNTER — Other Ambulatory Visit: Payer: Self-pay | Admitting: *Deleted

## 2011-07-30 MED ORDER — CLONIDINE HCL 0.3 MG PO TABS: 0.3000 mg | ORAL_TABLET | Freq: Two times a day (BID) | ORAL | Status: DC

## 2011-07-30 NOTE — Telephone Encounter (Addendum)
I was called to speak with patient's wife because he came in for his 24hour BPmonitor and the machine was not working so it was not placed on. She was upset because of this situation. I advised her to keep checking his BP with her cuff and record readings. If BP is high she will let us know even if it is after normal office hours.  Reassured her that someone is always on call for issues. Will call patient's wife back on 7/22 when Marcos Eke returns to reschedule the BP monitor.

## 2011-07-31 ENCOUNTER — Encounter: Payer: Self-pay | Admitting: Internal Medicine

## 2011-07-31 ENCOUNTER — Telehealth: Payer: Self-pay | Admitting: Internal Medicine

## 2011-07-31 ENCOUNTER — Ambulatory Visit (INDEPENDENT_AMBULATORY_CARE_PROVIDER_SITE_OTHER): Payer: Medicare Other | Admitting: Internal Medicine

## 2011-07-31 VITALS — BP 100/58 | HR 77 | Temp 97.0°F | Ht 71.0 in | Wt 172.4 lb

## 2011-07-31 DIAGNOSIS — I10 Essential (primary) hypertension: Secondary | ICD-10-CM

## 2011-07-31 MED ORDER — ASPIRIN EC 325 MG PO TBEC: 325.0000 mg | DELAYED_RELEASE_TABLET | Freq: Every day | ORAL | Status: DC

## 2011-07-31 MED ORDER — CLONIDINE HCL 0.3 MG/24HR TD PTWK: 1.0000 | MEDICATED_PATCH | TRANSDERMAL | Status: DC

## 2011-07-31 NOTE — Progress Notes (Signed)
  Subjective:    Patient ID: HAYVEN FATIMA, male    DOB: [birth year], 78 y.o.   MRN: [id number]  HPI  complains of variable BP - range 80-210s SBP Wife checking BP >10x/day due to fear of impending stroke Onset change/variablitiy on vacation few weeks ago - [place] 2 for sme Meds changes reviewed -  Also working with cards for same  Past Medical History  Diagnosis Date  . HTN (hypertension)     Negative renal duplex 07-22-11  . 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 Esophageal stricture     Distal, benign  . Diverticulosis   . GERD (gastroesophageal reflux disease)   . Hemorrhoids   . Allergic rhinitis   . Prostate cancer 2000    Seed XRT  . Dyslipidemia   . Chronic pain syndrome     Left shoulder, back  . Peripheral vascular disease     Left CEA 2002  . Asthma      Review of Systems  Constitutional: Negative for fever and fatigue.  Eyes: Negative for discharge and visual disturbance.  Respiratory: Negative for cough and shortness of breath.   Cardiovascular: Negative for chest pain, palpitations and leg swelling.  Neurological: Positive for dizziness. Negative for syncope, light-headedness, numbness and headaches.       Objective:   Physical Exam BP 100/58  Pulse 77  Temp 97 F (36.1 C) (Temporal)  Ht 5\' 11"  (1.803 m)  Wt 172 lb 6.4 oz (78.2 kg)  BMI 24.04 kg/m2  SpO2 97% Wt Readings from Last 3 Encounters:  07/31/11 172 lb 6.4 oz (78.2 kg)  07/27/11 171 lb (77.565 kg)  07/22/11 169 lb 9.6 oz (76.93 kg)   Constitutional:  He appears well-developed and well-nourished. No distress. Wife at side (she is very anxious) Neck: Normal range of motion. Neck supple. No JVD present. No thyromegaly present.  Cardiovascular: Normal rate, regular rhythm and normal heart sounds.  No murmur heard. no BLE edema Pulmonary/Chest: Effort normal and breath sounds normal. No respiratory distress. no wheezes.  Neurological: he is alert and oriented to  person, place, and time. No cranial nerve deficit. Coordination normal.  Psychiatric: he has a normal mood (slightly anxious) and affect. behavior is normal. Judgment and thought content normal.   Lab Results  Component Value Date   WBC 8.3 06/03/2011   HGB 12.8* 06/03/2011   HCT 38.7* 06/03/2011   PLT 307.0 06/03/2011   GLUCOSE 105* 06/03/2011   CHOL 126 03/31/2011   TRIG 111.0 03/31/2011   HDL 36.60* 03/31/2011   LDLCALC 67 03/31/2011   ALT 9 06/03/2011   AST 15 06/03/2011   NA 140 06/03/2011   K 4.9 06/03/2011   CL 108 06/03/2011   CREATININE 1.1 06/03/2011   BUN 25* 06/03/2011   CO2 26 06/03/2011   TSH 1.82 06/03/2011   PSA 0.02* 12/11/2008   INR 0.97 02/06/2010   HGBA1C 6.5 07/22/2011      Assessment & Plan:  See problem list. Medications and labs reviewed today.

## 2011-07-31 NOTE — Telephone Encounter (Signed)
Caller: Joanne/Spouse; PCP: Rene Paci; CB#: [phone];  Call regarding being seen in the ER on 07/15/11 for Elevated BP=215/? and Catapress increased to 0.3 mgs BID. BP is still going up to 180/80 in the evenings and wife is having to give extra doses of Catapress. Hx of stroke in 2001. 24 Hour Holter Monitor was supposed to be put on on 07/30/11 but part was missing so rescheduled to come in on 08/03/11 to see Dr. Tenny Craw. BP = 198/80 at 1845 on 07/30/11. Wife is very worried and requesting he be checked. Triage and Care advice per Hypertension Protocol and appnt scheduled for 1430 on 07/31/11.

## 2011-07-31 NOTE — Patient Instructions (Signed)
It was good to see you today. Start catapres-TTS 3 patch - change once weekly (place on skin today) continue all medications otherwise as ongoing for next 48h (Saturday and Sunday) then Monday stop clonidine pills, but continue all other pills as ongoing Keep follow up with cardiology as ongoing

## 2011-07-31 NOTE — Assessment & Plan Note (Signed)
BP Readings from Last 3 Encounters:  07/31/11 100/58  07/27/11 78/50  07/22/11 110/46   Suboptimal control, chronic. variable readings at extremes over past 3 weeks reviewed including med changes Review of prior intolerance of hydrochlorothiazide and hydralazine Renal duplex negative for RAS summer 2013 Change clonidine to patch for "smooth release" Reassurance provided on same

## 2011-08-04 ENCOUNTER — Encounter (INDEPENDENT_AMBULATORY_CARE_PROVIDER_SITE_OTHER): Payer: Medicare Other

## 2011-08-04 DIAGNOSIS — I10 Essential (primary) hypertension: Secondary | ICD-10-CM

## 2011-08-05 ENCOUNTER — Telehealth: Payer: Self-pay | Admitting: *Deleted

## 2011-08-05 NOTE — Telephone Encounter (Signed)
BP monitor completed and patient's wife called with results. See phone note from 7/24.

## 2011-08-05 NOTE — Telephone Encounter (Signed)
Called patient's wife with 24 Hour BP Monitor results. She did not want him to see a PA so I cancelled appointment with Norma Fredrickson and made appointment for follow up with Dr.Ross for 8/1 at 845 AM.

## 2011-08-07 ENCOUNTER — Telehealth: Payer: Self-pay | Admitting: Internal Medicine

## 2011-08-07 NOTE — Telephone Encounter (Signed)
Cannot find results of BP cuff.

## 2011-08-07 NOTE — Telephone Encounter (Signed)
Caller: Joann/Patient; PCP: Rene Paci; CB#: [phone]; ; ; Call regarding Question; Asking if Clonidine patch will take affect right away when she puts his new patch on tomorrow.  Started a week ago.  Advised medicine is in his system and will continue to release.  First dose takes longer to see results due to no drug in system.  Patient's last bp reading 118/62.  Question answered, no triage.

## 2011-08-10 ENCOUNTER — Telehealth: Payer: Self-pay | Admitting: Internal Medicine

## 2011-08-10 MED ORDER — METHYLDOPA 500 MG PO TABS: ORAL_TABLET | ORAL | Status: DC

## 2011-08-10 NOTE — Telephone Encounter (Signed)
New Problem:    Patient's wife called in needing a refill of her husband's methyldopa (ALDOMET) 500 MG tablet filled with [facility] phone [phone], option 2.  This in currently the only medication they need filled with [facility] and is not their main pharmacy.  Please call back once the order has been placed, please feel free to leave a message.  Patient would like to emphasize that this is very important.  Patient would also like this to be sent to [facility].

## 2011-08-10 NOTE — Telephone Encounter (Signed)
Called patient's wife and she is aware that I have sent refill for Aldomet to [facility].

## 2011-08-10 NOTE — Telephone Encounter (Signed)
BP  Logs ( 24 HOUR) in paper folder for review. Dr.Ross aware.

## 2011-08-11 ENCOUNTER — Other Ambulatory Visit: Payer: Self-pay | Admitting: Internal Medicine

## 2011-08-11 ENCOUNTER — Encounter: Payer: Self-pay | Admitting: Internal Medicine

## 2011-08-12 ENCOUNTER — Other Ambulatory Visit: Payer: Self-pay | Admitting: Internal Medicine

## 2011-08-12 ENCOUNTER — Ambulatory Visit: Payer: Medicare Other | Admitting: Nurse Practitioner

## 2011-08-13 ENCOUNTER — Telehealth: Payer: Self-pay | Admitting: Internal Medicine

## 2011-08-13 ENCOUNTER — Telehealth: Payer: Self-pay | Admitting: Nurse Practitioner

## 2011-08-13 ENCOUNTER — Encounter: Payer: Self-pay | Admitting: Internal Medicine

## 2011-08-13 ENCOUNTER — Ambulatory Visit (INDEPENDENT_AMBULATORY_CARE_PROVIDER_SITE_OTHER): Payer: Medicare Other | Admitting: Internal Medicine

## 2011-08-13 VITALS — BP 138/62 | HR 62 | Ht 71.0 in | Wt 171.0 lb

## 2011-08-13 DIAGNOSIS — I10 Essential (primary) hypertension: Secondary | ICD-10-CM

## 2011-08-13 DIAGNOSIS — E785 Hyperlipidemia, unspecified: Secondary | ICD-10-CM

## 2011-08-13 MED ORDER — ALBUTEROL SULFATE HFA 108 (90 BASE) MCG/ACT IN AERS: 2.0000 | INHALATION_SPRAY | Freq: Four times a day (QID) | RESPIRATORY_TRACT | Status: DC | PRN

## 2011-08-13 MED ORDER — SPIRONOLACTONE 25 MG PO TABS: ORAL_TABLET | ORAL | Status: DC

## 2011-08-13 MED ORDER — FLUTICASONE PROPIONATE HFA 220 MCG/ACT IN AERO: 1.0000 | INHALATION_SPRAY | Freq: Two times a day (BID) | RESPIRATORY_TRACT | Status: DC

## 2011-08-13 NOTE — Progress Notes (Signed)
HPI Patient is a 78 year old with a history of HTN.  I saw him in clinic on 7/15.  The wife came in with him  Brought in BP measurements from different times of the day  They were very labile.  HIghest readings were around MN and 1 AM.  She would often give him 0.2 to 0.3 of clonidine   At that time of visit his BP was low.  Urine was concentrated.   I was concerned about mix up in meds.  I spoke with daughter after visit (nurse at [facility]; [phone])recommended she pass ou meds for 24 to 48 hours.  I Patient wore a 24 hour BP monitor.  BP was labile but never greater than 190  Highest values were around MN. Since seen BP has been up and down.  Wife still giving hime prn clonidine at night.  Note that he was seen by Willey Blade and daily clonidine was switched to patch. He denies CP.  No SOB.  No TIA like sympotms.  Allergies  Allergen Reactions  . Codeine   . Diovan (Valsartan)     hyperkalemia  . Hctz (Hydrochlorothiazide)     Swelling and dyspnea   . Oxycodone-Acetaminophen     Current Outpatient Prescriptions  Medication Sig Dispense Refill  . amLODipine (NORVASC) 10 MG tablet Take 1 tablet (10 mg total) by mouth at bedtime.  90 tablet  3  . atorvastatin (LIPITOR) 40 MG tablet Take 1 tablet (40 mg total) by mouth daily.  30 tablet  11  . cloNIDine (CATAPRES-TTS-3) 0.3 mg/24hr Place 1 patch (0.3 mg total) onto the skin once a week.  4 patch  12  . clopidogrel (PLAVIX) 75 MG tablet Take 1 tablet (75 mg total) by mouth daily.  30 tablet  5  . gabapentin (NEURONTIN) 300 MG capsule take 1 capsule by mouth three times a day  90 capsule  10  . methyldopa (ALDOMET) 500 MG tablet 2 tabs in the AM ,1 tab mid day, 2 tabs at bedtime  450 tablet  3  . omeprazole (PRILOSEC) 40 MG capsule take 1 capsule by mouth once daily  30 capsule  1  . polyethylene glycol (MIRALAX / GLYCOLAX) packet Take 17 g by mouth as needed.       . senna-docusate (SENOKOT-S) 8.6-50 MG per tablet Take 1 tablet by mouth as  needed.        . traMADol (ULTRAM) 50 MG tablet take 1 to 2 tablets by mouth every 8 hours if needed for pain (MAX 6 TABLETS PER DAY)  60 tablet  1  . DISCONTD: valsartan (DIOVAN) 160 MG tablet Take 160 mg by mouth daily.          Past Medical History  Diagnosis Date  . HTN (hypertension)     Negative renal duplex 07-22-11  . 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 Esophageal stricture     Distal, benign  . Diverticulosis   . GERD (gastroesophageal reflux disease)   . Hemorrhoids   . Allergic rhinitis   . Prostate cancer 2000    Seed XRT  . Dyslipidemia   . Chronic pain syndrome     Left shoulder, back  . Peripheral vascular disease     Left CEA 2002  . Asthma     Past Surgical History  Procedure Date  . Back surgery 8/07    cervical spine  . Back surgery     lumbar  .  Appendectomy   . Cataract extraction   . Insertion prostate radiation seed 2000  . Carotid endarterectomy 2002    L cea    Family History  Problem Relation Age of Onset  . Lung disease    . Heart attack    . Cancer      History   Social History  . Marital Status: Married    Spouse Name: N/A    Number of Children: N/A  . Years of Education: N/A   Occupational History  . retired    Social History Main Topics  . Smoking status: Former Smoker    Types: Cigarettes    Quit date: 01/12/1978  . Smokeless tobacco: Current User    Types: Chew  . Alcohol Use: No     Prior alcoholic, sober since 2002  . Drug Use: No  . Sexually Active: Not Currently   Other Topics Concern  . Not on file   Social History Narrative  . No narrative on file    Review of Systems:  All systems reviewed.  They are negative to the above problem except as previously stated.  Vital Signs: BP 138/62  Pulse 62  Ht 5\' 11"  (1.803 m)  Wt 171 lb (77.565 kg)  BMI 23.85 kg/m2  SpO2 98%  Physical Exam Patient is in NAD HEENT:  Normocephalic, atraumatic. EOMI, PERRLA.  Neck: JVP is normal. No  thyromegaly. No bruits.  Lungs: clear to auscultation. No rales no wheezes.  Heart: Regular rate and rhythm. Normal S1, S2. No S3.   No significant murmurs. PMI not displaced.  Abdomen:  Supple, nontender. Normal bowel sounds. No masses. No hepatomegaly.  Extremities:   Good distal pulses throughout. No lower extremity edema.  Musculoskeletal :moving all extremities.  Neuro:   alert and oriented x3.  CN II-XII grossly intact.   Assessment and Plan:  1.  HTN  Labile.  Highest at night.   Renal USN normal. I have recomm that he can take an additonal 0.2 mg Clonidine at night.  I have encouraged wife to stop taking BP through the night.  Continue other meds Difficult problem overall as patient is intolerant to multiple meds.   2.  HL  Continue statin.  3.  Hyperglycemia  Hgb A1C was 6.5  I would not push Rx.

## 2011-08-13 NOTE — Telephone Encounter (Signed)
[facility] Pharmacist  [phone] reference # [id number]   Please return call to [facility] regarding drug therapy questions for methyldopa medications

## 2011-08-13 NOTE — Telephone Encounter (Signed)
Called pharm at [facility] and advised that Dr.Ross is aware of his dose of methyldopa. She wanted to let us know that this drug can cause bradycardia and depression. Advised that heart rate was 62 today and patient did not appear to be depressed. She is also aware that he will be seen in the clinic on 8/19. Will let Dr.Ross know that pharm called.

## 2011-08-13 NOTE — Patient Instructions (Signed)
Return to see Dr.Ross on 8/19 at 945 am.

## 2011-08-13 NOTE — Telephone Encounter (Signed)
pts wife checked his pressure within the past 20 mins and systolic was greater than 200.  She gave him a dose of clonidine 0.3mg  po x 1 and repeated it almost immediately afterwards and it is still up.  He is asymp.  I rec that she repeat his bp in 40-45 mins and if it remains greater than to go ahead and give his evening dose of methyldopa early rather than give him additional clonidine, as she was planning on doing.  She verbalized understanding and will call back if bp remains high despite ongoing medical therapy.

## 2011-08-14 ENCOUNTER — Telehealth: Payer: Self-pay | Admitting: Internal Medicine

## 2011-08-14 NOTE — Telephone Encounter (Signed)
Rec:  Just started Aldactone  Needs time to work. Give 0.1 Clonidine at 10  Do not dose beyond this.  Do not keep checking through night  May worsen

## 2011-08-14 NOTE — Telephone Encounter (Signed)
New msg [name] called about BP 218/80 last night and now 189/72 Please call

## 2011-08-14 NOTE — Telephone Encounter (Signed)
Patient's wife aware of MD recommendation.

## 2011-08-14 NOTE — Telephone Encounter (Signed)
Called patient's wife back. See note from Ward Givens from last night. BP today was 182/78 so she gave him an extra Catapress 0.2mg  today then BP came down to 158/64. Will discuss with Dr.Ross and call back.

## 2011-08-17 ENCOUNTER — Other Ambulatory Visit: Payer: Self-pay | Admitting: Internal Medicine

## 2011-08-18 ENCOUNTER — Telehealth: Payer: Self-pay | Admitting: Internal Medicine

## 2011-08-18 NOTE — Telephone Encounter (Signed)
pts wife gave bp from past few days, upset with elevated bp as follows; 8/3 Saturday 4;30 p 220/80 no pulses provided, catapres patch was changed that morning, she gave catapres 0.3 mg tablet       Evening 138/60 8/4 3:30 p 160/78        6:30 p 140/68        10:45 p 190/70 8/5 130/62       7 pm 180/78 gave 0.1 mg catapres        11:30 p 180/78 catapres no help.   Will forward to DR/Nurse at pt/ wife request.

## 2011-08-18 NOTE — Telephone Encounter (Signed)
New msg [name] called about his BP. She thinks his meds are not working. Please call her back

## 2011-08-19 NOTE — Telephone Encounter (Signed)
[name] CALLING RE OK FOR PT TO GO [place]ACH TOMORROW OR NOT DUE TO BP PLS CALL [phone]

## 2011-08-19 NOTE — Telephone Encounter (Signed)
[name] calling stating [name]  continues to have elevated BP at noc time and they are concerned that he's going to have stroke--[place] are sched [place] go to [place] this [place] and she wants dr Tenny Craw to know his current readings and that she gave him 0.3 catapres p.o. Last noc as bp WAS 220/80---advised i would let dr Tenny Craw know , but go ahead to beach, take catapres with you, and any further questions she has, please write down so they may review with dr Tenny Craw when they come for appoint end of month--wife agrees--nt

## 2011-08-19 NOTE — Telephone Encounter (Signed)
F/u please return call to patient wife, she has more question about taking pt out of town. She can be reached at [phone].

## 2011-08-20 MED ORDER — METHYLDOPA 500 MG PO TABS: ORAL_TABLET | ORAL | Status: DC

## 2011-08-20 NOTE — Telephone Encounter (Signed)
Rec he increase PM dose of methyl dopa to 1500mg  (3 tabs)  Take at 9 Patient should have a UA in couple weeks.

## 2011-08-20 NOTE — Telephone Encounter (Signed)
Informed of med increase/ MAR adjusted/ pt has app 8/19 and will get UA at OV.

## 2011-08-20 NOTE — Addendum Note (Signed)
Addended by: Antony Odea on: 08/20/2011 12:14 PM   Modules accepted: Orders

## 2011-08-31 ENCOUNTER — Encounter: Payer: Self-pay | Admitting: Internal Medicine

## 2011-08-31 ENCOUNTER — Ambulatory Visit (INDEPENDENT_AMBULATORY_CARE_PROVIDER_SITE_OTHER): Payer: Medicare Other | Admitting: Internal Medicine

## 2011-08-31 VITALS — BP 130/66 | HR 66 | Ht 71.0 in | Wt 169.0 lb

## 2011-08-31 DIAGNOSIS — I10 Essential (primary) hypertension: Secondary | ICD-10-CM

## 2011-08-31 DIAGNOSIS — R0602 Shortness of breath: Secondary | ICD-10-CM

## 2011-08-31 LAB — BASIC METABOLIC PANEL
BUN: 23 mg/dL (ref 6–23)
Creatinine, Ser: 1.1 mg/dL (ref 0.4–1.5)
GFR: 71.44 mL/min (ref >60.00)
Glucose, Bld: 81 mg/dL (ref 70–99)
Potassium: 5 mEq/L (ref 3.5–5.1)

## 2011-08-31 NOTE — Patient Instructions (Signed)
Lab work today We will call you with results.  Dr.Sharma 8/20 1045am [facility] office [address] [facility].

## 2011-08-31 NOTE — Progress Notes (Signed)
HPI Patient is a 78 year old with a history of HTN  I saw him  on August 1st  Wife is taking BP at home  BP is 160s to 170s.  Only one was 220.  She gave him o.3 clonidine at that ime.  Otherwise, occasional 0.1 mg  He denies CP  No dizziness. Allergies  Allergen Reactions  . Codeine   . Diovan (Valsartan)     hyperkalemia  . Hctz (Hydrochlorothiazide)     Swelling and dyspnea   . Oxycodone-Acetaminophen     Current Outpatient Prescriptions  Medication Sig Dispense Refill  . albuterol (PROVENTIL HFA;VENTOLIN HFA) 108 (90 BASE) MCG/ACT inhaler Inhale 2 puffs into the lungs every 6 (six) hours as needed for wheezing.  1 Inhaler  3  . amLODipine (NORVASC) 10 MG tablet Take 1 tablet (10 mg total) by mouth at bedtime.  90 tablet  3  . atorvastatin (LIPITOR) 40 MG tablet Take 1 tablet (40 mg total) by mouth daily.  30 tablet  11  . cloNIDine (CATAPRES) 0.1 MG tablet Take 1 tablet (0.1 mg total) by mouth daily at 10 pm.  60 tablet  11  . cloNIDine (CATAPRES-TTS-3) 0.3 mg/24hr Place 1 patch (0.3 mg total) onto the skin once a week.  4 patch  12  . clopidogrel (PLAVIX) 75 MG tablet Take 1 tablet (75 mg total) by mouth daily.  30 tablet  5  . fluticasone (FLOVENT HFA) 220 MCG/ACT inhaler Inhale 1 puff into the lungs 2 (two) times daily.  1 Inhaler  3  . gabapentin (NEURONTIN) 300 MG capsule take 1 capsule by mouth three times a day  90 capsule  10  . methyldopa (ALDOMET) 500 MG tablet 2 tabs in the AM ,1 tab mid day, 3 tabs at bedtime  540 tablet  3  . omeprazole (PRILOSEC) 40 MG capsule take 1 capsule by mouth once daily  30 capsule  1  . polyethylene glycol (MIRALAX / GLYCOLAX) packet Take 17 g by mouth as needed.       . senna-docusate (SENOKOT-S) 8.6-50 MG per tablet Take 1 tablet by mouth as needed.        [name] Kitchen spironolactone (ALDACTONE) 25 MG tablet One half tab every day  30 tablet  3  . traMADol (ULTRAM) 50 MG tablet take 1 to 2 tablets by mouth every 8 hours if needed for pain (MAX 6  TABLETS PER DAY)  60 tablet  1  . DISCONTD: valsartan (DIOVAN) 160 MG tablet Take 160 mg by mouth daily.          Past Medical History  Diagnosis Date  . HTN (hypertension)     Negative renal duplex 07-22-11  . 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 Esophageal stricture     Distal, benign  . Diverticulosis   . GERD (gastroesophageal reflux disease)   . Hemorrhoids   . Allergic rhinitis   . Prostate cancer 2000    Seed XRT  . Dyslipidemia   . Chronic pain syndrome     Left shoulder, back  . Peripheral vascular disease     Left CEA 2002  . Asthma     Past Surgical History  Procedure Date  . Back surgery 8/07    cervical spine  . Back surgery     lumbar  . Appendectomy   . Cataract extraction   . Insertion prostate radiation seed 2000  . Carotid endarterectomy 2002  L cea    Family History  Problem Relation Age of Onset  . Lung disease    . Heart attack    . Cancer      History   Social History  . Marital Status: Married    Spouse Name: N/A    Number of Children: N/A  . Years of Education: N/A   Occupational History  . retired    Social History Main Topics  . Smoking status: Former Smoker    Types: Cigarettes    Quit date: 01/12/1978  . Smokeless tobacco: Current User    Types: Chew  . Alcohol Use: No     Prior alcoholic, sober since 2002  . Drug Use: No  . Sexually Active: Not Currently   Other Topics Concern  . Not on file   Social History Narrative  . No narrative on file    Review of Systems:  All systems reviewed.  They are negative to the above problem except as previously stated.  Vital Signs: BP 130/66  Pulse 66  Ht 5\' 11"  (1.803 m)  Wt 169 lb (76.658 kg)  BMI 23.57 kg/m2  Physical Exam Patient is in NAD HEENT:  Normocephalic, atraumatic. EOMI, PERRLA.  Neck: JVP is normal.  No bruits.  Lungs:  Diffuse wheezes.   Heart: Regular rate and rhythm. Normal S1, S2. No S3.   No significant murmurs. PMI not  displaced.  Abdomen:  Supple, nontender. Normal bowel sounds. No masses. No hepatomegaly.  Extremities:   Good distal pulses throughout. No lower extremity edema.  Musculoskeletal :moving all extremities.  Neuro:   alert and oriented x3.  CN II-XII grossly intact.   Assessment and Plan:  1.  Pulmonary.  Patient has diffuse wheezes on exam.  He notes some SOB. Wife says they have cats at home.  She also says he is not using inhalers like he is supposed to.  I have contacted Dr. Norman Clay.  Patient says he saw him in remote past.  Have made an appt for Tuesday.  Should be tested.  PRob needs steoids.  2.  HTN  BP is OK today  Not at home.  I have asked him to take amlodipine in AM not in afternoon.  I will need to reflect on further changes.  Unfort patient is intolerant to many meds.

## 2011-09-10 ENCOUNTER — Telehealth: Payer: Self-pay | Admitting: Internal Medicine

## 2011-09-10 NOTE — Telephone Encounter (Signed)
LMOM for call back. 

## 2011-09-10 NOTE — Telephone Encounter (Signed)
Pt is still wheezing and she wants to talk to you to let you know what's going on

## 2011-09-11 ENCOUNTER — Other Ambulatory Visit: Payer: Self-pay | Admitting: Internal Medicine

## 2011-09-11 NOTE — Telephone Encounter (Signed)
Called patient's wife. She has been in contact with Dr.Sharma's nurse and was advised that medication they gave him for his lungs could take 1 to 2 weeks to work. Wanted to know when to return to see Dr.Ross. Will ask and call back. Has Appointment with PCP in October.

## 2011-09-11 NOTE — Telephone Encounter (Signed)
Recomm end of September f/u

## 2011-09-13 ENCOUNTER — Other Ambulatory Visit: Payer: Self-pay | Admitting: Internal Medicine

## 2011-09-21 ENCOUNTER — Other Ambulatory Visit: Payer: Self-pay | Admitting: Internal Medicine

## 2011-09-21 NOTE — Telephone Encounter (Signed)
Called and made appointment for 9/23 at 945 am with Dr.Ross.

## 2011-09-22 ENCOUNTER — Other Ambulatory Visit: Payer: Self-pay | Admitting: Internal Medicine

## 2011-10-05 ENCOUNTER — Telehealth: Payer: Self-pay

## 2011-10-05 ENCOUNTER — Ambulatory Visit (INDEPENDENT_AMBULATORY_CARE_PROVIDER_SITE_OTHER): Payer: Medicare Other | Admitting: Internal Medicine

## 2011-10-05 ENCOUNTER — Encounter: Payer: Self-pay | Admitting: Internal Medicine

## 2011-10-05 VITALS — BP 121/62 | HR 61 | Ht 71.0 in | Wt 171.0 lb

## 2011-10-05 DIAGNOSIS — R062 Wheezing: Secondary | ICD-10-CM

## 2011-10-05 MED ORDER — OMEPRAZOLE 40 MG PO CPDR: 40.0000 mg | DELAYED_RELEASE_CAPSULE | Freq: Every day | ORAL | Status: DC

## 2011-10-05 MED ORDER — CLONIDINE HCL 0.1 MG PO TABS: 0.1000 mg | ORAL_TABLET | Freq: Every day | ORAL | Status: DC

## 2011-10-05 NOTE — Telephone Encounter (Signed)
Spoke with the nurse at Dr. Octavia Bruckner and she will let the pt know about the appt for Thurs., with Dr. Vassie Loll, 10/08/11 @ 2pm. Pt will arrive 15 minutes early.  If this is not good for the pt they will call back to get this rescheduled.

## 2011-10-05 NOTE — Progress Notes (Signed)
HPI Patient is a 78 year old with a history of HTN that is difficult to control.  He was last in clinic on 8/19.   When I saw him he was wheezing diffusely.  I set him up to be seen in alllergy clinic.   His BP was OK at visit  I recomm he take amlodipine in AM not in  afternoon. SInce seen he has only had 1 BP over 200.  Left log in car.  BP has been 120s to 180s. Still wheezing.  Having problems with nasal washes. Seen by Dr. [place] Callas  Given a shot and pills  Sent home on inhaler. F/U in March.  CXR reportedly done.  Was OK. Allergies  Allergen Reactions  . Codeine   . Diovan (Valsartan)     hyperkalemia  . Hctz (Hydrochlorothiazide)     Swelling and dyspnea   . Oxycodone-Acetaminophen     Current Outpatient Prescriptions  Medication Sig Dispense Refill  . albuterol (PROVENTIL HFA;VENTOLIN HFA) 108 (90 BASE) MCG/ACT inhaler Inhale 2 puffs into the lungs every 6 (six) hours as needed for wheezing.  1 Inhaler  3  . amLODipine (NORVASC) 10 MG tablet Take 1 tablet (10 mg total) by mouth at bedtime.  90 tablet  3  . atorvastatin (LIPITOR) 40 MG tablet Take 1 tablet (40 mg total) by mouth daily.  30 tablet  11  . cloNIDine (CATAPRES) 0.1 MG tablet Take 1 tablet (0.1 mg total) by mouth daily at 10 pm.  60 tablet  11  . cloNIDine (CATAPRES-TTS-3) 0.3 mg/24hr Place 1 patch (0.3 mg total) onto the skin once a week.  4 patch  12  . clopidogrel (PLAVIX) 75 MG tablet Take 1 tablet (75 mg total) by mouth daily.  30 tablet  5  . fluticasone (FLOVENT HFA) 220 MCG/ACT inhaler Inhale 1 puff into the lungs 2 (two) times daily.  1 Inhaler  3  . gabapentin (NEURONTIN) 300 MG capsule take 1 capsule by mouth three times a day  90 capsule  10  . methyldopa (ALDOMET) 500 MG tablet 2 tabs in the AM ,1 tab mid day, 3 tabs at bedtime  540 tablet  3  . omeprazole (PRILOSEC) 40 MG capsule take 1 capsule by mouth once daily  30 capsule  1  . polyethylene glycol (MIRALAX / GLYCOLAX) packet Take 17 g by mouth as  needed.       . senna-docusate (SENOKOT-S) 8.6-50 MG per tablet Take 1 tablet by mouth as needed.        [name] Kitchen spironolactone (ALDACTONE) 25 MG tablet One half tab every day  30 tablet  3  . traMADol (ULTRAM) 50 MG tablet take 1 to 2 tablets by mouth every 8 hours if needed for pain (MAX 6 TABLETS PER DAY)  60 tablet  1  . DISCONTD: valsartan (DIOVAN) 160 MG tablet Take 160 mg by mouth daily.          Past Medical History  Diagnosis Date  . HTN (hypertension)     Negative renal duplex 07-22-11  . 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 Esophageal stricture     Distal, benign  . Diverticulosis   . GERD (gastroesophageal reflux disease)   . Hemorrhoids   . Allergic rhinitis   . Prostate cancer 2000    Seed XRT  . Dyslipidemia   . Chronic pain syndrome     Left shoulder, back  . Peripheral vascular disease  Left CEA 2002  . Asthma     Past Surgical History  Procedure Date  . Back surgery 8/07    cervical spine  . Back surgery     lumbar  . Appendectomy   . Cataract extraction   . Insertion prostate radiation seed 2000  . Carotid endarterectomy 2002    L cea    Family History  Problem Relation Age of Onset  . Lung disease    . Heart attack    . Cancer      History   Social History  . Marital Status: Married    Spouse Name: N/A    Number of Children: N/A  . Years of Education: N/A   Occupational History  . retired    Social History Main Topics  . Smoking status: Former Smoker    Types: Cigarettes    Quit date: 01/12/1978  . Smokeless tobacco: Current User    Types: Chew  . Alcohol Use: No     Prior alcoholic, sober since 2002  . Drug Use: No  . Sexually Active: Not Currently   Other Topics Concern  . Not on file   Social History Narrative  . No narrative on file    Review of Systems:  All systems reviewed.  They are negative to the above problem except as previously stated.  Vital Signs: BP 121/62  Pulse 61  Ht 5\' 11"  (1.803  m)  Wt 171 lb (77.565 kg)  BMI 23.85 kg/m2  Physical Exam Patinet in NAD HEENT:  Normocephalic, atraumatic. EOMI, PERRLA.  Neck: JVP is normal.   Lungs: Moving air.  Bilateral wheezes. Heart: Regular rate and rhythm. Normal S1, S2. No S3.   No significant murmurs. PMI not displaced.  Abdomen:  Supple, nontender. Normal bowel sounds. No masses. No hepatomegaly.  Extremities:   Good distal pulses throughout. No lower extremity edema.  Musculoskeletal :moving all extremities.  Neuro:   alert and oriented x3.  CN II-XII grossly intact.   Assessment and Plan:  1.  HTN  The patient is due to bring in his BP chart as well as his cuff to review. ON recheck of BP cuff his is reading 20 points higher than cuff in office.   2.  Wheezing.  Breathing seems a little better but he is still wheezing.  I have contacted Dr Madill Callas.  He has said that with patient's medical problems he would recomm the patient be seen in pulmonary clinic   Will arrange an appt.

## 2011-10-05 NOTE — Patient Instructions (Addendum)
You have been referred to [facility] PULMONOLOGY   PRILOSEC WAS SENT INTO [facility] [address]  CLONIDINE WAS SENT INTO [facility]  WAITING FOR BP READINGS FOR DR. ROSS TO MAKE ANY CHANGES THIS AFTERNOON

## 2011-10-08 ENCOUNTER — Encounter: Payer: Self-pay | Admitting: Pulmonary Disease

## 2011-10-08 ENCOUNTER — Ambulatory Visit (INDEPENDENT_AMBULATORY_CARE_PROVIDER_SITE_OTHER): Payer: Medicare Other | Admitting: Pulmonary Disease

## 2011-10-08 VITALS — BP 120/58 | HR 59 | Temp 98.6°F | Ht 71.0 in | Wt 173.4 lb

## 2011-10-08 DIAGNOSIS — J45909 Unspecified asthma, uncomplicated: Secondary | ICD-10-CM

## 2011-10-08 DIAGNOSIS — R062 Wheezing: Secondary | ICD-10-CM

## 2011-10-08 MED ORDER — ALBUTEROL SULFATE HFA 108 (90 BASE) MCG/ACT IN AERS: 2.0000 | INHALATION_SPRAY | Freq: Four times a day (QID) | RESPIRATORY_TRACT | Status: DC | PRN

## 2011-10-08 MED ORDER — BUDESONIDE-FORMOTEROL FUMARATE 160-4.5 MCG/ACT IN AERO: 2.0000 | INHALATION_SPRAY | Freq: Two times a day (BID) | RESPIRATORY_TRACT | Status: DC

## 2011-10-08 NOTE — Assessment & Plan Note (Signed)
Post bronchodilator testing was not performed today. Unclear if his airway obstruction represents asthma or an affect of smoking in the past. If it does represent asthma, I'm not sure if this is fixed obstruction from his childhood history. It is remarkable that he is not very symptomatic with this may just be due to his sedentary lifestyle. Trial of Symbicort with recheck of spirometry to see if this improves. If it not we'll perform formal post bronchodilator testing. OK to stay on Singulair for now. We'll consider pulmonary rehabilitation in the future once his bronchospasm is better controlled

## 2011-10-08 NOTE — Patient Instructions (Signed)
Trial of symbicort 160 2puffs twice daily STOP taking flovent Use albuterol inhaler 2 puffs every 6h as needed only for shortness of breath or wheezing (rescue)

## 2011-10-08 NOTE — Progress Notes (Signed)
Subjective:    Patient ID: [name], male    DOB: November 19, 1929, 78 y.o.   MRN: [id number]  HPI 78 year old remote smoker presents for evaluation of wheezing and dyspnea. Smoked 2 packs per week for about 30 years before he quit in 1980. He reports asthma as a child which recurred as an adult but has not bothered him in the last decade. He reports wheezing for the past few months but denies dyspnea on exertion. He does admit that is not very active but can walk across the parking lot and in the store. Dr. Tenny Craw started him on Flovent and albuterol when necessary for wheezing. He underwent allergy evaluation by Dr. [place] Callas , allergy testing was nondiagnostic, he was started on Singulair and general allergy avoidance measures were recommended. CXR 1/12 showed clear lungs & Anterior bowing of the sternum  Spirometry showed moderate airway obstruction with FEV1 62%, FVC 68% and ratio of 65.  Environment- he lives in a 64 year old home with cats, hardwood floors, 72-year-old mattress and central air conditioning, and obvious exposure to mold He denies sinus drainage or obvious heartburn.  Past Medical History  Diagnosis Date  . HTN (hypertension)     Negative renal duplex 07-22-11  . 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 Esophageal stricture     Distal, benign  . Diverticulosis   . GERD (gastroesophageal reflux disease)   . Hemorrhoids   . Allergic rhinitis   . Prostate cancer 2000    Seed XRT  . Dyslipidemia   . Chronic pain syndrome     Left shoulder, back  . Peripheral vascular disease     Left CEA 2002  . Asthma     Past Surgical History  Procedure Date  . Back surgery 8/07    cervical spine  . Back surgery     lumbar  . Appendectomy   . Cataract extraction   . Insertion prostate radiation seed 2000  . Carotid endarterectomy 2002    L cea  . Nasal sinus surgery    History   Social History  . Marital Status: Married    Spouse Name: N/A    Number of  Children: N/A  . Years of Education: N/A   Occupational History  . retired    Social History Main Topics  . Smoking status: Former Smoker -- 35 years    Types: Cigarettes    Quit date: 01/12/1978  . Smokeless tobacco: Current User    Types: Chew  . Alcohol Use: No     Prior alcoholic, sober since 2002  . Drug Use: No  . Sexually Active: Not Currently   Other Topics Concern  . Not on file   Social History Narrative  . No narrative on file      Review of Systems  Constitutional: Negative for fever, appetite change and unexpected weight change.  HENT: Positive for trouble swallowing. Negative for ear pain, sore throat, sneezing and dental problem.   Respiratory: Negative for cough and shortness of breath.   Cardiovascular: Positive for leg swelling. Negative for chest pain and palpitations.  Gastrointestinal: Negative for abdominal pain.  Musculoskeletal: Negative for joint swelling.  Skin: Negative for rash.  Neurological: Negative for headaches.  Psychiatric/Behavioral: Negative for dysphoric mood. The patient is not nervous/anxious.        Objective:   Physical Exam  Gen. Pleasant, well-nourished, in no distress, normal affect ENT - no lesions, no post nasal drip Neck: No  JVD, no thyromegaly, no carotid bruits Lungs: no use of accessory muscles, no dullness to percussion, no rales, faint exp rhonchi  Cardiovascular: Rhythm regular, heart sounds  normal, no murmurs or gallops, no peripheral edema Abdomen: soft and non-tender, no hepatosplenomegaly, BS normal. Musculoskeletal: No deformities, no cyanosis or clubbing Neuro:  alert, non focal        Assessment & Plan:

## 2011-10-13 ENCOUNTER — Other Ambulatory Visit: Payer: Self-pay | Admitting: *Deleted

## 2011-10-13 MED ORDER — TRAMADOL HCL 50 MG PO TABS: ORAL_TABLET | ORAL | Status: DC

## 2011-10-13 NOTE — Telephone Encounter (Signed)
R'cd fax from [facility] for refill of Tramadol.

## 2011-11-02 ENCOUNTER — Ambulatory Visit: Payer: Medicare Other | Admitting: Internal Medicine

## 2011-11-04 ENCOUNTER — Other Ambulatory Visit: Payer: Self-pay | Admitting: Internal Medicine

## 2011-11-04 ENCOUNTER — Other Ambulatory Visit: Payer: Self-pay | Admitting: *Deleted

## 2011-11-04 MED ORDER — TRAMADOL HCL 50 MG PO TABS: ORAL_TABLET | ORAL | Status: DC

## 2011-11-05 ENCOUNTER — Ambulatory Visit: Payer: Medicare Other | Admitting: Adult Health

## 2011-11-06 ENCOUNTER — Ambulatory Visit (INDEPENDENT_AMBULATORY_CARE_PROVIDER_SITE_OTHER): Payer: Medicare Other | Admitting: Internal Medicine

## 2011-11-06 ENCOUNTER — Encounter: Payer: Self-pay | Admitting: Internal Medicine

## 2011-11-06 VITALS — BP 134/60 | HR 61 | Temp 97.3°F | Ht 71.0 in | Wt 175.8 lb

## 2011-11-06 DIAGNOSIS — J45909 Unspecified asthma, uncomplicated: Secondary | ICD-10-CM

## 2011-11-06 DIAGNOSIS — K429 Umbilical hernia without obstruction or gangrene: Secondary | ICD-10-CM

## 2011-11-06 DIAGNOSIS — I10 Essential (primary) hypertension: Secondary | ICD-10-CM

## 2011-11-06 DIAGNOSIS — G8929 Other chronic pain: Secondary | ICD-10-CM

## 2011-11-06 NOTE — Assessment & Plan Note (Signed)
BP Readings from Last 3 Encounters:  11/06/11 134/60  10/08/11 120/58  10/05/11 121/62   BP Readings from Last 3 Encounters:  11/06/11 134/60  10/08/11 120/58  10/05/11 121/62  Suboptimal control, chronic. variable readings at extremes over past weeks reviewed including med changes Review of prior intolerance of hydrochlorothiazide and hydralazine Renal duplex negative for RAS summer 2013 On amlopdipine, aldomet and spironolactone Improved with change clonidine to patch 07/2011 for "smooth release" Also using 10pm dose to keep normal thru night, hold if SBP<100

## 2011-11-06 NOTE — Assessment & Plan Note (Signed)
Soft and reduced on exam Education and reassurance provided about diagnosis Red flags for which to seek emergency care reviewed with patient and wife today

## 2011-11-06 NOTE — Patient Instructions (Signed)
It was good to see you today. Medications reviewed and updated, no changes today Refill on medication(s) as discussed today. We have reviewed your prior records including labs and tests today Continue working with your other subspecialists as reviewed Please schedule followup in 4 months, call sooner if problems.

## 2011-11-06 NOTE — Assessment & Plan Note (Signed)
Chronic pain syndrome related to back and left shoulder. Prior steroid injections by orthopedics ([facility]) with limited relief Reviewed prior history of substance abuse including alcohol and narcotics. Wife reviews and confirms same. I explained to patient we would help with his symptoms through alternative management and avoid narcotic use. Continue tramadol prn and steroid shots as needed Can consider referral to pain clinic in future if needed.

## 2011-11-06 NOTE — Assessment & Plan Note (Signed)
PFTs September 2013 with pulmonary reviewed Improving with trial Symbicort, reviewed use of same as well as rescue albuterol  Continue Singulair and followup with pulmonary as planned Significant component of pseudo-wheeze from the vocal cords also appreciated on exam and explained to wife

## 2011-11-06 NOTE — Progress Notes (Signed)
  Subjective:    Patient ID: [name], male    DOB: 16-Mar-1929, 78 y.o.   MRN: [id number]  HPI  Here for follow up -  HTN - variable readings - Also working with cards for same  Asthma - new dx clarified 9/13 - working with pulm on same - wife still hearing wheeze but shortness of breath symptoms have improved  GERD - takes PPI prn - no epigastric pain or bowel changes  Past Medical History  Diagnosis Date  . HTN (hypertension)     Negative renal duplex 07-22-11  . 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 Esophageal stricture     Distal, benign  . Diverticulosis   . GERD (gastroesophageal reflux disease)   . Hemorrhoids   . Allergic rhinitis   . Prostate cancer 2000    Seed XRT  . Dyslipidemia   . Chronic pain syndrome     Left shoulder, back  . Peripheral vascular disease     Left CEA 2002  . Asthma      Review of Systems  Constitutional: Negative for fever and fatigue.  Eyes: Negative for discharge and visual disturbance.  Respiratory: Negative for cough and shortness of breath.   Cardiovascular: Negative for chest pain, palpitations and leg swelling.  Neurological: Negative for syncope, light-headedness, numbness and headaches.       Objective:   Physical Exam  BP 134/60  Pulse 61  Temp 97.3 F (36.3 C) (Oral)  Ht 5\' 11"  (1.803 m)  Wt 175 lb 12.8 oz (79.742 kg)  BMI 24.52 kg/m2  SpO2 97% Wt Readings from Last 3 Encounters:  11/06/11 175 lb 12.8 oz (79.742 kg)  10/08/11 173 lb 6.4 oz (78.654 kg)  10/05/11 171 lb (77.565 kg)   Constitutional:  He appears well-developed and well-nourished. No distress. Wife at side Neck: Normal range of motion. Neck supple. No JVD present. No thyromegaly present.  Cardiovascular: Normal rate, regular rhythm and normal heart sounds.  No murmur heard. no BLE edema Pulmonary/Chest: Effort normal and breath sounds normal. No respiratory distress. no wheezes.   abdomen: Small 1 cm round soft and reducible  umbilical hernia on exam. Nontender. Neurological: he is alert and oriented to person, place, and time. No cranial nerve deficit. Coordination normal.  Psychiatric: he has a normal mood (slightly anxious) and affect. behavior is normal. Judgment and thought content normal.   Lab Results  Component Value Date   WBC 8.3 06/03/2011   HGB 12.8* 06/03/2011   HCT 38.7* 06/03/2011   PLT 307.0 06/03/2011   GLUCOSE 81 08/31/2011   CHOL 126 03/31/2011   TRIG 111.0 03/31/2011   HDL 36.60* 03/31/2011   LDLCALC 67 03/31/2011   ALT 9 06/03/2011   AST 15 06/03/2011   NA 139 08/31/2011   K 5.0 08/31/2011   CL 106 08/31/2011   CREATININE 1.1 08/31/2011   BUN 23 08/31/2011   CO2 27 08/31/2011   TSH 1.82 06/03/2011   PSA 0.02* 12/11/2008   INR 0.97 02/06/2010   HGBA1C 6.5 07/22/2011      Assessment & Plan:  See problem list. Medications and labs reviewed today.  Time spent with pt/family today 25 minutes, greater than 50% time spent counseling patient on  hypertension and umbilical hernia diagnosis and medication review. Also review of prior records

## 2011-11-11 ENCOUNTER — Telehealth: Payer: Self-pay | Admitting: Internal Medicine
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
## 2011-11-12 ENCOUNTER — Ambulatory Visit (INDEPENDENT_AMBULATORY_CARE_PROVIDER_SITE_OTHER): Payer: Medicare Other | Admitting: Nurse Practitioner

## 2011-11-12 ENCOUNTER — Encounter: Payer: Self-pay | Admitting: Nurse Practitioner

## 2011-11-12 VITALS — BP 122/54 | HR 60 | Ht 68.0 in | Wt 175.5 lb

## 2011-11-12 DIAGNOSIS — R438 Other disturbances of smell and taste: Secondary | ICD-10-CM

## 2011-11-12 DIAGNOSIS — R194 Change in bowel habit: Secondary | ICD-10-CM

## 2011-11-12 DIAGNOSIS — R439 Unspecified disturbances of smell and taste: Secondary | ICD-10-CM

## 2011-11-12 DIAGNOSIS — R198 Other specified symptoms and signs involving the digestive system and abdomen: Secondary | ICD-10-CM

## 2011-11-12 NOTE — Progress Notes (Signed)
11/12/2011 [name] [id number] [birth year]   History of Present Illness:   Patient is an 78 year old male known to Dr. Juanda Chance. He was last seen April 2011 for evaluation of postprandial abdominal pain (epigastric).  Following that visit, patient had a colonoscopy with findings of a sessile polyp in the ascending colon.    On Tuesday of this week patient developed a horrible taste in mouth. He was nauseated, couldn't eat. He hasn't eaten anything unusual on Monday. Wife didn't notice malodorous breath. Patient did have some nasal congestion at the time. No new medications. No heartburn. He takes Prilosec every other day for reflux. No dysphagia unless he waits too long in between meals to eat.  He does have a history of candida esophagitis.    Patient is prone to constipation but over the last month he has been having multiple FORMED bowel movements day. He was taking 1-2 stools softeners a day however. No rectal bleeding. No unusual weight loss. No abdominal pain.     Current Medications, Allergies, Past Medical History, Past Surgical History, Family History and Social History were reviewed in [employer] record.   Physical Exam: General: Well developed , white male in no acute distress Head: Normocephalic and atraumatic Eyes:  sclerae anicteric, conjunctiva pink  Ears: Normal auditory acuity Lungs: Clear throughout to auscultation Heart: Regular rate and rhythm Abdomen: Soft, non tender and non distended. No masses, no hepatomegaly. Normal bowel sounds Rectal: no stool in vault.  Musculoskeletal: Symmetrical with no gross deformities  Extremities: No edema  Neurological: Alert oriented x 4, grossly nonfocal Psychological:  Alert and cooperative. Normal mood and affect  Assessment and Recommendations:  1. Bad taste in mouth earlier this week.. He has been congested. Perhaps problem was sinus related.  He rinses after steroid inhaler, no candida on exam.  Dentition seems okay, no tooth pain. This may have been GER related. He takes a PPI every other day. At any rate, he is better.   2. Bowel changes, probably related to stool softner. I asked him to hold stool softeners to see if bowels return to normal. If bowel return to baseline then patient should limit stool softeners to one every other day.

## 2011-11-12 NOTE — Patient Instructions (Addendum)
You have been given a separate informational sheet regarding your tobacco use, the importance of quitting and local resources to help you quit. Call us if your symptoms return.  Hold stool softeners for now.

## 2011-11-13 ENCOUNTER — Encounter: Payer: Self-pay | Admitting: Nurse Practitioner

## 2011-11-13 DIAGNOSIS — R438 Other disturbances of smell and taste: Secondary | ICD-10-CM | POA: Insufficient documentation

## 2011-11-13 DIAGNOSIS — R194 Change in bowel habit: Secondary | ICD-10-CM | POA: Insufficient documentation

## 2011-11-16 ENCOUNTER — Ambulatory Visit: Payer: Medicare Other | Admitting: Adult Health

## 2011-11-16 NOTE — Progress Notes (Signed)
Reviewed and agree with management. Felice Hope D. Wendy Hoback, M.D., FACG  

## 2011-11-24 ENCOUNTER — Other Ambulatory Visit: Payer: Self-pay | Admitting: Internal Medicine

## 2011-11-24 ENCOUNTER — Ambulatory Visit (INDEPENDENT_AMBULATORY_CARE_PROVIDER_SITE_OTHER): Payer: Medicare Other

## 2011-11-24 DIAGNOSIS — Z23 Encounter for immunization: Secondary | ICD-10-CM

## 2011-11-24 DIAGNOSIS — Z2911 Encounter for prophylactic immunotherapy for respiratory syncytial virus (RSV): Secondary | ICD-10-CM

## 2011-11-26 ENCOUNTER — Other Ambulatory Visit: Payer: Self-pay | Admitting: Internal Medicine

## 2011-11-26 ENCOUNTER — Other Ambulatory Visit: Payer: Self-pay | Admitting: *Deleted

## 2011-11-26 MED ORDER — METHYLDOPA 500 MG PO TABS: ORAL_TABLET | ORAL | Status: DC

## 2011-11-26 NOTE — Telephone Encounter (Signed)
New Problem:    Patient called in needing some methyldopa (ALDOMET) 500 MG tablet pills called into his pharmacy listed on file until his mail order delivers his prescription next week.  Please call back once the order has been placed.

## 2011-11-26 NOTE — Telephone Encounter (Signed)
[facility] [address]

## 2011-12-04 DIAGNOSIS — Z0279 Encounter for issue of other medical certificate: Secondary | ICD-10-CM

## 2011-12-07 ENCOUNTER — Ambulatory Visit: Payer: Medicare Other | Admitting: Adult Health

## 2011-12-08 ENCOUNTER — Other Ambulatory Visit: Payer: Self-pay | Admitting: Dermatology

## 2011-12-14 ENCOUNTER — Telehealth: Payer: Self-pay | Admitting: Internal Medicine

## 2011-12-14 NOTE — Telephone Encounter (Signed)
Stop Senokott and Miralax. Start Bentyl 10 mg po bid, #60. 1 refill

## 2011-12-14 NOTE — Telephone Encounter (Signed)
Spoke with patient's wife. She states patient is having 4 to 5 formed bowel movements/day. He is off stool softeners. He often has to change his depend because he cannot make to to bathroom in time and he has stool. States he wears  Depends for bladder issues. Denies any pain or bleeding. Patient does take Lomotil occssionally. The stool incontinence is causing the patient anxiety. Please, advise.

## 2011-12-15 MED ORDER — DICYCLOMINE HCL 10 MG PO CAPS: ORAL_CAPSULE | ORAL | Status: DC

## 2011-12-15 NOTE — Telephone Encounter (Signed)
Spoke with patient and gave him Dr. Regino Schultze recommendations. Rx sent.

## 2011-12-17 ENCOUNTER — Telehealth: Payer: Self-pay | Admitting: Internal Medicine

## 2011-12-17 ENCOUNTER — Ambulatory Visit: Payer: Medicare Other | Admitting: Adult Health

## 2011-12-17 NOTE — Telephone Encounter (Signed)
New problems:   Aldomet  500 mg. Pharmacy only sent 5 a day. Should be 6 .  [facility] [address] [phone]

## 2011-12-18 ENCOUNTER — Other Ambulatory Visit: Payer: Self-pay | Admitting: Internal Medicine

## 2011-12-22 ENCOUNTER — Ambulatory Visit: Payer: Medicare Other | Admitting: Adult Health

## 2011-12-23 MED ORDER — METHYLDOPA 500 MG PO TABS: ORAL_TABLET | ORAL | Status: DC

## 2012-01-05 ENCOUNTER — Ambulatory Visit (INDEPENDENT_AMBULATORY_CARE_PROVIDER_SITE_OTHER): Payer: Medicare Other | Admitting: Internal Medicine

## 2012-01-05 ENCOUNTER — Encounter: Payer: Self-pay | Admitting: Internal Medicine

## 2012-01-05 ENCOUNTER — Other Ambulatory Visit (INDEPENDENT_AMBULATORY_CARE_PROVIDER_SITE_OTHER): Payer: Medicare Other

## 2012-01-05 VITALS — BP 130/62 | HR 60 | Temp 97.0°F | Ht 71.0 in | Wt 179.8 lb

## 2012-01-05 DIAGNOSIS — R319 Hematuria, unspecified: Secondary | ICD-10-CM

## 2012-01-05 DIAGNOSIS — R5381 Other malaise: Secondary | ICD-10-CM

## 2012-01-05 DIAGNOSIS — L259 Unspecified contact dermatitis, unspecified cause: Secondary | ICD-10-CM

## 2012-01-05 DIAGNOSIS — D649 Anemia, unspecified: Secondary | ICD-10-CM

## 2012-01-05 DIAGNOSIS — I10 Essential (primary) hypertension: Secondary | ICD-10-CM

## 2012-01-05 DIAGNOSIS — R5383 Other fatigue: Secondary | ICD-10-CM

## 2012-01-05 LAB — CBC WITH DIFFERENTIAL/PLATELET
Basophils Absolute: 0 10*3/uL (ref 0.0–0.1)
Basophils Relative: 0.3 % (ref 0.0–3.0)
Eosinophils Absolute: 0.3 10*3/uL (ref 0.0–0.7)
Eosinophils Relative: 4.2 % (ref 0.0–5.0)
HCT: 32.8 % — ABNORMAL LOW (ref 39.0–52.0)
Hemoglobin: 10.8 g/dL — ABNORMAL LOW (ref 13.0–17.0)
Lymphocytes Relative: 14.6 % (ref 12.0–46.0)
Lymphs Abs: 0.9 10*3/uL (ref 0.7–4.0)
MCHC: 32.9 g/dL (ref 30.0–36.0)
MCV: 91.4 fl (ref 78.0–100.0)
Monocytes Absolute: 0.5 10*3/uL (ref 0.1–1.0)
Monocytes Relative: 8.5 % (ref 3.0–12.0)
Neutro Abs: 4.4 10*3/uL (ref 1.4–7.7)
Neutrophils Relative %: 72.4 % (ref 43.0–77.0)
Platelets: 302 10*3/uL (ref 150.0–400.0)
RBC: 3.59 Mil/uL — ABNORMAL LOW (ref 4.22–5.81)
RDW: 13.4 % (ref 11.5–14.6)
WBC: 6.1 10*3/uL (ref 4.5–10.5)

## 2012-01-05 LAB — URINALYSIS, ROUTINE W REFLEX MICROSCOPIC
Bilirubin Urine: NEGATIVE
Hgb urine dipstick: NEGATIVE
Leukocytes, UA: NEGATIVE
Nitrite: NEGATIVE
Specific Gravity, Urine: 1.02
Total Protein, Urine: NEGATIVE
Urine Glucose: NEGATIVE
Urobilinogen, UA: 4
pH: 6 (ref 5.0–8.0)

## 2012-01-05 MED ORDER — ASPIRIN EC 325 MG PO TBEC: 325.0000 mg | DELAYED_RELEASE_TABLET | Freq: Every day | ORAL | Status: AC

## 2012-01-05 NOTE — Assessment & Plan Note (Signed)
  BP Readings from Last 3 Encounters:  01/05/12 130/62  11/12/11 122/54  11/06/11 134/60  Suboptimal control, chronic. Review of prior intolerance of hydrochlorothiazide and hydralazine Renal duplex negative for RAS summer 2013 On amlopdipine, aldomet and spironolactone Improved with change clonidine to patch 07/2011 for "smooth release" (skin reaction noted and accepted side effects by pt/wife) Also using 10pm dose to keep normal thru night, hold if SBP<100

## 2012-01-05 NOTE — Patient Instructions (Signed)
It was good to see you today. We will fax clearance letter to [facility] as requested before 01/27/12 Test(s) ordered today. Your results will be released to MyChart (or called to you) after review, usually within 72hours after test completion. If any changes need to be made, you will be notified at that same time. Medications reviewed and updated, no changes today Let us know if the rash gets worse and we will consider other blood pressure medicine as needed Continue working with your other subspecialists as reviewed Please schedule followup in 6 months, call sooner if problems.
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
## 2012-01-05 NOTE — Addendum Note (Signed)
Addended by: Rene Paci A on: 01/05/2012 02:13 PM   Modules accepted: Orders

## 2012-01-07 ENCOUNTER — Encounter: Payer: Self-pay | Admitting: Internal Medicine

## 2012-01-07 ENCOUNTER — Telehealth: Payer: Self-pay | Admitting: Internal Medicine

## 2012-01-07 ENCOUNTER — Other Ambulatory Visit: Payer: Self-pay | Admitting: Internal Medicine

## 2012-01-07 NOTE — Telephone Encounter (Signed)
Message copied by Newell Coral on Thu Jan 07, 2012  8:26 AM ------      Message from: Rene Paci A      Created: Tue Jan 05, 2012  2:09 PM       Will you please generate [facility] letterhead note to [facility] stating i have evaluated this pt and feel he is medically capable of driving. - fax to [phone] and let pt's wife know when we have done same - needs to be at [facility] before 01/27/12            Thanks so much!

## 2012-01-07 NOTE — Telephone Encounter (Signed)
Done and faxed

## 2012-01-14 ENCOUNTER — Telehealth: Payer: Self-pay | Admitting: *Deleted

## 2012-01-14 MED ORDER — CENTRATEX 106-1 MG PO CAPS: ORAL_CAPSULE | ORAL | Status: DC

## 2012-01-14 NOTE — Telephone Encounter (Signed)
Pt filled put a walk in sheet stating he would to get a rx for Centratex (Iron) for his low iron. It is cheaper that buying ovc drugs...Raechel Chute

## 2012-01-15 ENCOUNTER — Ambulatory Visit: Payer: Medicare Other | Admitting: Adult Health

## 2012-01-19 ENCOUNTER — Telehealth: Payer: Self-pay | Admitting: Internal Medicine

## 2012-01-19 NOTE — Telephone Encounter (Signed)
Spoke to patient's wife she stated patient unable to take catapres patches,causing itching and red rash.Dr.Ross out of office today will send message to her for advice.Call wife back 01/20/12 after 2:00 pm [phone].

## 2012-01-19 NOTE — Telephone Encounter (Signed)
New problem:     C/O side effects of catapres patch , red, itchy. Going on for about a month change from each side of chest.

## 2012-01-20 NOTE — Telephone Encounter (Signed)
Called patient's wife back. Advised to stop Clonidine patch and start Clonidine 0.3 mg tabs 3 times per day per Dr.Ross. She was concerned about the dose. She states that he already takes a 0.1mg  Clonidine at bedtime. For now she will give him Clonidine 0.3 mg 2 times per day and 0.1mg  at bedtime and monitor BP. Will call us back if BP not stable. Will let Dr.Ross know.

## 2012-01-20 NOTE — Telephone Encounter (Signed)
Take him off patch   Place him on 0.3 tid po clonidine.   Can't miss doses.

## 2012-01-21 ENCOUNTER — Other Ambulatory Visit: Payer: Self-pay | Admitting: *Deleted

## 2012-01-21 MED ORDER — METHYLDOPA 500 MG PO TABS: ORAL_TABLET | ORAL | Status: DC

## 2012-01-21 NOTE — Telephone Encounter (Signed)
Patient in needing a 1 month supply of medication until mail order sends rx #[id number], 0 refills [facility] [address] for Methldopa 500mg , 2 in am, 1 mid day, 3 in pm   Kayliah Tindol CMA

## 2012-01-22 ENCOUNTER — Ambulatory Visit (INDEPENDENT_AMBULATORY_CARE_PROVIDER_SITE_OTHER): Payer: Medicare Other | Admitting: Internal Medicine

## 2012-01-22 ENCOUNTER — Encounter: Payer: Self-pay | Admitting: Internal Medicine

## 2012-01-22 VITALS — BP 130/58 | HR 68 | Temp 97.8°F | Resp 10 | Wt 179.0 lb

## 2012-01-22 DIAGNOSIS — I10 Essential (primary) hypertension: Secondary | ICD-10-CM

## 2012-01-22 DIAGNOSIS — L251 Unspecified contact dermatitis due to drugs in contact with skin: Secondary | ICD-10-CM

## 2012-01-22 MED ORDER — TRIAMCINOLONE ACETONIDE 0.5 % EX CREA
TOPICAL_CREAM | Freq: Three times a day (TID) | CUTANEOUS | Status: DC
Start: 2012-01-22 — End: 2012-12-16

## 2012-01-22 NOTE — Progress Notes (Signed)
  Subjective:    Patient ID: [name], male    DOB: 1929-09-09, 79 y.o.   MRN: [id number]  Rash This is a new problem. The current episode started 1 to 4 weeks ago. The problem has been gradually worsening since onset. The affected locations include the chest. The rash is characterized by itchiness, redness and dryness. Associated with: new catapress patch. Pertinent negatives include no anorexia, congestion, cough, diarrhea, eye pain, facial edema, fatigue, fever, joint pain, nail changes, rhinorrhea, shortness of breath, sore throat or vomiting. Past treatments include nothing. The treatment provided no relief.      Review of Systems  Constitutional: Negative.  Negative for fever and fatigue.  HENT: Negative.  Negative for congestion, sore throat and rhinorrhea.   Eyes: Negative.  Negative for pain.  Respiratory: Negative for cough, chest tightness, shortness of breath, wheezing and stridor.   Cardiovascular: Negative for chest pain, palpitations and leg swelling.  Gastrointestinal: Negative for vomiting, abdominal pain, diarrhea and anorexia.  Genitourinary: Negative.   Musculoskeletal: Negative.  Negative for joint pain.  Skin: Positive for rash. Negative for nail changes, color change, pallor and wound.  Neurological: Negative.   Hematological: Negative for adenopathy. Does not bruise/bleed easily.  Psychiatric/Behavioral: Negative.        Objective:   Physical Exam  Vitals reviewed. Constitutional: He appears well-developed and well-nourished. No distress.  HENT:  Head: Normocephalic and atraumatic.  Mouth/Throat: Oropharynx is clear and moist. No oropharyngeal exudate.  Eyes: Conjunctivae normal are normal. Right eye exhibits no discharge. Left eye exhibits no discharge. No scleral icterus.  Neck: Normal range of motion. Neck supple. No JVD present. No tracheal deviation present. No thyromegaly present.  Cardiovascular: Normal rate, regular rhythm, normal heart sounds and  intact distal pulses.  Exam reveals no gallop and no friction rub.   No murmur heard. Pulmonary/Chest: Effort normal and breath sounds normal. No stridor. No respiratory distress. He has no wheezes. He has no rales. He exhibits no tenderness.  Abdominal: Soft. Bowel sounds are normal. He exhibits no distension and no mass. There is no tenderness. There is no rebound and no guarding.  Musculoskeletal: Normal range of motion. He exhibits no edema and no tenderness.  Lymphadenopathy:    He has no cervical adenopathy.  Skin: Skin is warm, dry and intact. Rash noted. No abrasion, no bruising, no burn, no ecchymosis, no laceration, no lesion, no petechiae and no purpura noted. Rash is papular. Rash is not macular, not maculopapular, not nodular, not pustular, not vesicular and not urticarial. He is not diaphoretic. There is erythema. No pallor.             Assessment & Plan:

## 2012-01-22 NOTE — Assessment & Plan Note (Signed)
To avoid withdrawal from the clonidine he will go back to the oral clonidine

## 2012-01-22 NOTE — Assessment & Plan Note (Signed)
d'c the patch Apply tac cream until it is gone

## 2012-01-22 NOTE — Patient Instructions (Signed)

## 2012-01-23 ENCOUNTER — Other Ambulatory Visit: Payer: Self-pay | Admitting: Internal Medicine

## 2012-01-26 ENCOUNTER — Other Ambulatory Visit: Payer: Self-pay | Admitting: Internal Medicine

## 2012-01-29 ENCOUNTER — Telehealth: Payer: Self-pay | Admitting: Internal Medicine

## 2012-01-29 NOTE — Telephone Encounter (Signed)
New  Problem:    Methyldopa  500 mg.    [phone]. [facility].   Fax # [phone]

## 2012-02-16 ENCOUNTER — Other Ambulatory Visit: Payer: Self-pay | Admitting: Internal Medicine

## 2012-02-22 ENCOUNTER — Other Ambulatory Visit: Payer: Self-pay | Admitting: Internal Medicine

## 2012-03-03 ENCOUNTER — Telehealth: Payer: Self-pay | Admitting: Internal Medicine

## 2012-03-03 NOTE — Telephone Encounter (Signed)
Patient needs to have all of his medications sent to [facility] and he needs to come by and pick up written scripts that he can mail into them

## 2012-03-04 ENCOUNTER — Other Ambulatory Visit: Payer: Self-pay | Admitting: *Deleted

## 2012-03-07 ENCOUNTER — Telehealth: Payer: Self-pay | Admitting: *Deleted

## 2012-03-07 ENCOUNTER — Other Ambulatory Visit: Payer: Self-pay | Admitting: Internal Medicine

## 2012-03-07 ENCOUNTER — Telehealth: Payer: Self-pay | Admitting: Internal Medicine

## 2012-03-07 DIAGNOSIS — R062 Wheezing: Secondary | ICD-10-CM

## 2012-03-07 MED ORDER — ATORVASTATIN CALCIUM 40 MG PO TABS: 40.0000 mg | ORAL_TABLET | Freq: Every day | ORAL | Status: DC

## 2012-03-07 MED ORDER — TRAMADOL HCL 50 MG PO TABS: ORAL_TABLET | ORAL | Status: DC

## 2012-03-07 MED ORDER — OMEPRAZOLE 40 MG PO CPDR: 40.0000 mg | DELAYED_RELEASE_CAPSULE | Freq: Every day | ORAL | Status: DC

## 2012-03-07 MED ORDER — CLONIDINE HCL 0.1 MG PO TABS: 0.1000 mg | ORAL_TABLET | Freq: Every day | ORAL | Status: DC

## 2012-03-07 MED ORDER — CLOPIDOGREL BISULFATE 75 MG PO TABS: 75.0000 mg | ORAL_TABLET | Freq: Every day | ORAL | Status: DC

## 2012-03-07 MED ORDER — SPIRONOLACTONE 25 MG PO TABS: ORAL_TABLET | ORAL | Status: DC

## 2012-03-07 MED ORDER — AMLODIPINE BESYLATE 10 MG PO TABS: 10.0000 mg | ORAL_TABLET | Freq: Every day | ORAL | Status: DC

## 2012-03-07 MED ORDER — GABAPENTIN 300 MG PO CAPS: ORAL_CAPSULE | ORAL | Status: DC

## 2012-03-07 NOTE — Telephone Encounter (Signed)
done

## 2012-03-07 NOTE — Telephone Encounter (Signed)
Advised pt to follow up with PCP for low hgb.  Pt agreed.

## 2012-03-07 NOTE — Telephone Encounter (Signed)
New Prob    Pts wife states pt has low iron levels. She would like Dr. Tenny Craw to check on this for her. Would like to speak to nurse.

## 2012-03-07 NOTE — Telephone Encounter (Signed)
Husband is also needing new rx's to send to [facility] mail [facility]. Inform wife will leave up front for pick-up...Raechel Chute

## 2012-03-09 ENCOUNTER — Telehealth: Payer: Self-pay | Admitting: Internal Medicine

## 2012-03-09 ENCOUNTER — Telehealth: Payer: Self-pay | Admitting: Pulmonary Disease

## 2012-03-09 ENCOUNTER — Other Ambulatory Visit (INDEPENDENT_AMBULATORY_CARE_PROVIDER_SITE_OTHER): Payer: Medicare Other

## 2012-03-09 DIAGNOSIS — D649 Anemia, unspecified: Secondary | ICD-10-CM

## 2012-03-09 DIAGNOSIS — R5383 Other fatigue: Secondary | ICD-10-CM

## 2012-03-09 DIAGNOSIS — R5381 Other malaise: Secondary | ICD-10-CM

## 2012-03-09 LAB — CBC WITH DIFFERENTIAL/PLATELET
Basophils Absolute: 0 10*3/uL (ref 0.0–0.1)
Eosinophils Absolute: 0.3 10*3/uL (ref 0.0–0.7)
Hemoglobin: 12.1 g/dL — ABNORMAL LOW (ref 13.0–17.0)
Lymphocytes Relative: 17.2 % (ref 12.0–46.0)
MCHC: 33.4 g/dL (ref 30.0–36.0)
Neutro Abs: 5.2 10*3/uL (ref 1.4–7.7)
RDW: 15 % — ABNORMAL HIGH (ref 11.5–14.6)

## 2012-03-09 MED ORDER — CLONIDINE HCL 0.3 MG PO TABS: 0.3000 mg | ORAL_TABLET | Freq: Two times a day (BID) | ORAL | Status: DC

## 2012-03-09 NOTE — Telephone Encounter (Signed)
Called and spoke with pts wife and pt last was seen by RA in September 2013.  pts wife was calling to get the symbicort and the flovent refilled for the pt since he is out of both meds.  She stated that she thought Dr. Lyla Son office gave him these meds and their office told her to do the flovent daily as needed and the symbicort bid, but they now tell her they have no record of this.   She stated that he has no meds now and needs the flovent refilled.  flovent was taken off of his med list at his last ov with RA.  RA out of the office today.  Please advise. Thanks  Allergies  Allergen Reactions  . Codeine   . Diovan (Valsartan)     hyperkalemia  . Hctz (Hydrochlorothiazide)     Swelling and dyspnea   . Oxycodone-Acetaminophen

## 2012-03-09 NOTE — Telephone Encounter (Signed)
Notified pt rx ready for pick-up.../lmb 

## 2012-03-09 NOTE — Telephone Encounter (Signed)
Pt request refill on CATAPRES 0.3mg ,must be written for 90days for mail order, please call when rx is ready for pick up

## 2012-03-10 ENCOUNTER — Telehealth: Payer: Self-pay | Admitting: Internal Medicine

## 2012-03-10 MED ORDER — BUDESONIDE-FORMOTEROL FUMARATE 160-4.5 MCG/ACT IN AERO: 2.0000 | INHALATION_SPRAY | Freq: Two times a day (BID) | RESPIRATORY_TRACT | Status: DC

## 2012-03-10 NOTE — Telephone Encounter (Signed)
Pt is requesting lab results.  Says no one told them how to take medicine.  Says no one told them to come for labs.  Dr. Yetta Barre instructions told them to come in Feb for an appt. [phone]

## 2012-03-10 NOTE — Telephone Encounter (Signed)
Ok to refill symbicort Does not need flovent -symbicort has a steroid in it

## 2012-03-10 NOTE — Telephone Encounter (Signed)
Called pt spoke with wife gave her labs results again. First msg was left on there cell # this am. No one answer home line...Raechel Chute

## 2012-03-10 NOTE — Telephone Encounter (Signed)
I spoke with spouse and is aware of RA recs. I have sent RX in for symbicort. Nothing further was needed

## 2012-03-28 ENCOUNTER — Encounter: Payer: Self-pay | Admitting: Vascular Surgery

## 2012-03-28 ENCOUNTER — Other Ambulatory Visit: Payer: Self-pay | Admitting: Internal Medicine

## 2012-03-29 ENCOUNTER — Ambulatory Visit (INDEPENDENT_AMBULATORY_CARE_PROVIDER_SITE_OTHER): Payer: Medicare Other | Admitting: Vascular Surgery

## 2012-03-29 ENCOUNTER — Other Ambulatory Visit (INDEPENDENT_AMBULATORY_CARE_PROVIDER_SITE_OTHER): Payer: Medicare Other | Admitting: *Deleted

## 2012-03-29 ENCOUNTER — Telehealth: Payer: Self-pay | Admitting: *Deleted

## 2012-03-29 ENCOUNTER — Encounter: Payer: Self-pay | Admitting: Vascular Surgery

## 2012-03-29 ENCOUNTER — Other Ambulatory Visit: Payer: Self-pay | Admitting: *Deleted

## 2012-03-29 ENCOUNTER — Ambulatory Visit: Payer: Medicare Other | Admitting: Neurosurgery

## 2012-03-29 DIAGNOSIS — I6529 Occlusion and stenosis of unspecified carotid artery: Secondary | ICD-10-CM

## 2012-03-29 DIAGNOSIS — Z48812 Encounter for surgical aftercare following surgery on the circulatory system: Secondary | ICD-10-CM

## 2012-03-29 MED ORDER — CLONIDINE HCL 0.1 MG PO TABS: 0.1000 mg | ORAL_TABLET | Freq: Every day | ORAL | Status: DC

## 2012-03-29 MED ORDER — TRAMADOL HCL 50 MG PO TABS: ORAL_TABLET | ORAL | Status: DC

## 2012-03-29 NOTE — Addendum Note (Signed)
Addended by: Adria Dill L on: 03/29/2012 04:04 PM   Modules accepted: Orders

## 2012-03-29 NOTE — Telephone Encounter (Signed)
[name] wants to know when he needs to come back in to recheck his anemia.

## 2012-03-29 NOTE — Telephone Encounter (Signed)
Pt's spouse informed of MD's advisement via VM and to callback office with any questions/concerns.

## 2012-03-29 NOTE — Progress Notes (Signed)
The patient presents today for followup of right carotid endarterectomy and Dacron patch angioplasty on 02/10/2010. He is here today with his wife. They were both report that he has generalized diminished stamina. He has no focal neurologic deficits. He does report 2 episodes of being awakened while asleep with a "electroshock" down his left arm. No focal deficits regarding this. He does have a history of prior cervical disc surgery. He's had no episodes of aphasia or amaurosis fugax.  Past Medical History  Diagnosis Date  . HTN (hypertension)     Negative renal duplex 07-22-11  . 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 Esophageal stricture     Distal, benign  . Diverticulosis   . GERD (gastroesophageal reflux disease)   . Hemorrhoids   . Allergic rhinitis   . Prostate cancer 2000    Seed XRT  . Dyslipidemia   . Chronic pain syndrome     Left shoulder, back  . Peripheral vascular disease     Left CEA 2002  . Asthma   . Anemia     History  Substance Use Topics  . Smoking status: Former Smoker -- 35 years    Types: Cigarettes    Quit date: 01/12/1978  . Smokeless tobacco: Current User    Types: Chew  . Alcohol Use: No     Comment: Prior alcoholic, sober since 2002    Family History  Problem Relation Age of Onset  . Lung cancer Brother   . Stroke Sister     Allergies  Allergen Reactions  . Codeine   . Diovan (Valsartan)     hyperkalemia  . Hctz (Hydrochlorothiazide)     Swelling and dyspnea   . Oxycodone-Acetaminophen     Current outpatient prescriptions:amLODipine (NORVASC) 10 MG tablet, Take 1 tablet (10 mg total) by mouth daily., Disp: 90 tablet, Rfl: 3;  atorvastatin (LIPITOR) 40 MG tablet, TAKE 1 TABLET (40 MG TOTAL) BY MOUTH DAILY., Disp: 30 tablet, Rfl: 6;  budesonide-formoterol (SYMBICORT) 160-4.5 MCG/ACT inhaler, Inhale 2 puffs into the lungs 2 (two) times daily., Disp: 1 Inhaler, Rfl: 3 cloNIDine (CATAPRES) 0.1 MG tablet, Take 1 tablet (0.1  mg total) by mouth daily at 10 pm., Disp: 30 tablet, Rfl: 2;  cloNIDine (CATAPRES) 0.3 MG tablet, Take 1 tablet (0.3 mg total) by mouth 2 (two) times daily., Disp: 180 tablet, Rfl: 3;  clopidogrel (PLAVIX) 75 MG tablet, Take 1 tablet (75 mg total) by mouth daily., Disp: 90 tablet, Rfl: 3;  Ferrous Sulfate Dried (FERROUS SULFATE CR PO), Take by mouth 2 (two) times daily., Disp: , Rfl:  gabapentin (NEURONTIN) 300 MG capsule, TAKE ONE CAPSULE 3 TIMES A DAY, Disp: 270 capsule, Rfl: 3;  methyldopa (ALDOMET) 500 MG tablet, 2 tabs in the AM ,1 tab mid day, 3 tabs at bedtime, Disp: 180 tablet, Rfl: 0;  omeprazole (PRILOSEC) 40 MG capsule, Take 1 capsule (40 mg total) by mouth daily., Disp: 90 capsule, Rfl: 3;  spironolactone (ALDACTONE) 25 MG tablet, TAKE 1/2 TABLET BY MOUTH EVERY DAY, Disp: 30 tablet, Rfl: 6 traMADol (ULTRAM) 50 MG tablet, TAKE 1 TO 2 TABLETS BY MOUTH EVERY 8 HOURS IF NEEDED FOR PAIN (MAX 6 TABLETS PER DAY), Disp: 60 tablet, Rfl: 1;  triamcinolone cream (KENALOG) 0.5 %, Apply topically 3 (three) times daily., Disp: 30 g, Rfl: 1;  albuterol (PROVENTIL HFA;VENTOLIN HFA) 108 (90 BASE) MCG/ACT inhaler, Inhale 2 puffs into the lungs every 6 (six) hours as needed for wheezing., Disp:  1 Inhaler, Rfl: 3 atorvastatin (LIPITOR) 40 MG tablet, Take 1 tablet (40 mg total) by mouth daily., Disp: 90 tablet, Rfl: 3;  dicyclomine (BENTYL) 10 MG capsule, Take one tablet BID, Disp: 60 capsule, Rfl: 1;  Fe Fum-FA-B Cmp-C-Zn-Mg-Mn-Cu (CENTRATEX) 106-1 MG CAPS, Take 1 capsule daily for low iron, Disp: 30 each, Rfl: 5;  polyethylene glycol (MIRALAX / GLYCOLAX) packet, Take 17 g by mouth as needed. , Disp: , Rfl:  senna-docusate (SENOKOT-S) 8.6-50 MG per tablet, Take 1 tablet by mouth as needed.  , Disp: , Rfl: ;  [DISCONTINUED] valsartan (DIOVAN) 160 MG tablet, Take 160 mg by mouth daily.  , Disp: , Rfl:   BP 147/69  Pulse 60  Resp 18  Ht 5\' 10"  (1.778 m)  Wt 176 lb 9.6 oz (80.105 kg)  BMI 25.34 kg/m2  Body mass  index is 25.34 kg/(m^2).       Exam: Well-developed well-nourished white male in no acute distress Neurologically grossly intact Right carotid incision well healed with no carotid bruits bilaterally 2+ radial pulses bilaterally Respirations without wheezes, nonlabored.  Carotid duplex today revealed widely patent endarterectomy on the right and no significant stenosis on the left with some thickening and mild plaque.  Impression and plan: Stable status post right carotid endarterectomy in 2012. He will continue with yearly surveillance carotid duplex. He'll notify should he develop any focal deficits.

## 2012-03-29 NOTE — Telephone Encounter (Signed)
Anemia had improved back to baseline at last check in February 2014 I would recommend followup as scheduled ( May or June 2014 would be 6 months), call sooner if symptomatic issues or problems arise

## 2012-04-15 ENCOUNTER — Other Ambulatory Visit: Payer: Self-pay | Admitting: Internal Medicine

## 2012-04-18 ENCOUNTER — Other Ambulatory Visit: Payer: Self-pay | Admitting: Internal Medicine

## 2012-05-04 ENCOUNTER — Other Ambulatory Visit (INDEPENDENT_AMBULATORY_CARE_PROVIDER_SITE_OTHER): Payer: Medicare Other

## 2012-05-04 ENCOUNTER — Other Ambulatory Visit (HOSPITAL_COMMUNITY): Payer: Self-pay | Admitting: Diagnostic Radiology

## 2012-05-04 ENCOUNTER — Encounter: Payer: Self-pay | Admitting: Internal Medicine

## 2012-05-04 ENCOUNTER — Ambulatory Visit (INDEPENDENT_AMBULATORY_CARE_PROVIDER_SITE_OTHER): Payer: Medicare Other | Admitting: Internal Medicine

## 2012-05-04 VITALS — BP 132/60 | HR 62 | Temp 97.8°F | Wt 180.2 lb

## 2012-05-04 DIAGNOSIS — E785 Hyperlipidemia, unspecified: Secondary | ICD-10-CM

## 2012-05-04 DIAGNOSIS — I10 Essential (primary) hypertension: Secondary | ICD-10-CM

## 2012-05-04 DIAGNOSIS — I6350 Cerebral infarction due to unspecified occlusion or stenosis of unspecified cerebral artery: Secondary | ICD-10-CM

## 2012-05-04 DIAGNOSIS — R51 Headache: Secondary | ICD-10-CM

## 2012-05-04 LAB — CBC WITH DIFFERENTIAL/PLATELET
Basophils Relative: 0.5 % (ref 0.0–3.0)
Eosinophils Absolute: 0.3 10*3/uL (ref 0.0–0.7)
HCT: 36.4 % — ABNORMAL LOW (ref 39.0–52.0)
Lymphs Abs: 1.4 10*3/uL (ref 0.7–4.0)
MCHC: 34.4 g/dL (ref 30.0–36.0)
MCV: 90.2 fl (ref 78.0–100.0)
Monocytes Absolute: 0.6 10*3/uL (ref 0.1–1.0)
Neutrophils Relative %: 66.7 % (ref 43.0–77.0)
RBC: 4.04 Mil/uL — ABNORMAL LOW (ref 4.22–5.81)

## 2012-05-04 LAB — BASIC METABOLIC PANEL
BUN: 29 mg/dL — ABNORMAL HIGH (ref 6–23)
CO2: 28 mEq/L (ref 19–32)
GFR: 61.22 mL/min (ref >60.00)
Glucose, Bld: 91 mg/dL (ref 70–99)
Potassium: 5.7 mEq/L — ABNORMAL HIGH (ref 3.5–5.1)

## 2012-05-04 LAB — HEPATIC FUNCTION PANEL
ALT: 12 U/L (ref 0–53)
Bilirubin, Direct: 0.1 mg/dL (ref 0.0–0.3)
Total Bilirubin: 0.6 mg/dL (ref 0.3–1.2)

## 2012-05-04 LAB — LIPID PANEL
Cholesterol: 98 mg/dL (ref 0–200)
LDL Cholesterol: 44 mg/dL (ref 0–99)

## 2012-05-04 LAB — TSH: TSH: 3.31 u[IU]/mL (ref 0.35–5.50)

## 2012-05-04 NOTE — Assessment & Plan Note (Addendum)
On low-dose simvastatin, reasonable control reviewing last lipids. Check annually history of stroke in 2001 and left CEA in 2002 The current medical regimen is effective;  continue present plan and medications.

## 2012-05-04 NOTE — Assessment & Plan Note (Signed)
BP Readings from Last 3 Encounters:  05/04/12 132/60  03/29/12 147/69  01/22/12 130/58   The current medical regimen is effective;  continue present plan and medications.

## 2012-05-04 NOTE — Patient Instructions (Addendum)
It was good to see you today. We have reviewed your prior records including labs and tests today Test(s) ordered today. Your results will be released to MyChart (or called to you) after review, usually within 72hours after test completion. If any changes need to be made, you will be notified at that same time. we'll make referral 4 MRI and MRA of your brain to look for problem causing headache or recurrent stroke. Our office will contact you regarding appointment(s) once made. Medications reviewed and updated, no changes recommended at this time.  Schedule followup appointment in 6 months for routine review, sooner if continued or new problems

## 2012-05-04 NOTE — Assessment & Plan Note (Signed)
CVA with mild chronic LLE weakness since 2002 Check MRI/MRA as above Continue medical mgmt of same

## 2012-05-04 NOTE — Progress Notes (Signed)
Subjective:    Patient ID: [name], male    DOB: 11-Aug-1929, 79 y.o.   MRN: [id number]  Headache  This is a new problem. The current episode started more than 1 month ago. The problem occurs intermittently. The problem has been gradually worsening. The pain is located in the left unilateral region. The pain does not radiate. The pain quality is similar to prior headaches (similar to prior CVA .sx per pt/wife). The quality of the pain is described as sharp and shooting (electric shock; spells last 10-25", then spont resolve - quick on/quick off). The pain is moderate. Associated symptoms include insomnia. Pertinent negatives include no abnormal behavior, back pain, coughing, ear pain, eye pain, eye redness, fever, loss of balance, muscle aches, nausea, numbness, photophobia, rhinorrhea, scalp tenderness, seizures, sinus pressure, tinnitus, visual change, vomiting or weight loss. Nothing aggravates the symptoms. He has tried nothing for the symptoms.    Past Medical History  Diagnosis Date  . HTN (hypertension)     Negative renal duplex 07-22-11  . 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 Esophageal stricture     Distal, benign  . Diverticulosis   . GERD (gastroesophageal reflux disease)   . Hemorrhoids   . Allergic rhinitis   . Prostate cancer 2000    Seed XRT  . Dyslipidemia   . Chronic pain syndrome     Left shoulder, back  . Peripheral vascular disease     Left CEA 2002  . Asthma   . Anemia     Review of Systems  Constitutional: Negative for fever, weight loss and fatigue.  HENT: Negative for ear pain, rhinorrhea, sinus pressure and tinnitus.   Eyes: Negative for photophobia, pain, discharge, redness and visual disturbance.  Respiratory: Negative for cough and shortness of breath.   Cardiovascular: Negative for chest pain, palpitations and leg swelling.  Gastrointestinal: Negative for nausea and vomiting.  Musculoskeletal: Negative for back pain.   Neurological: Positive for headaches. Negative for seizures, syncope, light-headedness, numbness and loss of balance.  Psychiatric/Behavioral: The patient has insomnia.        Objective:   Physical Exam  BP 132/60  Pulse 62  Temp(Src) 97.8 F (36.6 C) (Oral)  Wt 180 lb 3.2 oz (81.738 kg)  BMI 25.86 kg/m2  SpO2 95% Wt Readings from Last 3 Encounters:  05/04/12 180 lb 3.2 oz (81.738 kg)  03/29/12 176 lb 9.6 oz (80.105 kg)  01/22/12 179 lb 0.6 oz (81.212 kg)   Constitutional:  He appears well-developed and well-nourished. No distress. Wife at side Neck: Normal range of motion. Neck supple. No JVD present. No thyromegaly present.  Cardiovascular: Normal rate, regular rhythm and normal heart sounds.  No murmur heard. no BLE edema Pulmonary/Chest: Effort normal and breath sounds normal. No respiratory distress. no wheezes.  Neurological: he is alert and oriented to person, place, and time. No cranial nerve deficit. MAE with good strength. Coordination, speech, balance and recall are normal/baseline. chronic antalgic gait from back issues and LLE/quad 4+/5 from remote CVA Psychiatric: he has a normal mood (slightly anxious) and affect. behavior is normal. Judgment and thought content normal.   Lab Results  Component Value Date   WBC 7.2 03/09/2012   HGB 12.1* 03/09/2012   HCT 36.3* 03/09/2012   PLT 298.0 03/09/2012   GLUCOSE 81 08/31/2011   CHOL 126 03/31/2011   TRIG 111.0 03/31/2011   HDL 36.60* 03/31/2011   LDLCALC 67 03/31/2011   ALT 9 06/03/2011  AST 15 06/03/2011   NA 139 08/31/2011   K 5.0 08/31/2011   CL 106 08/31/2011   CREATININE 1.1 08/31/2011   BUN 23 08/31/2011   CO2 27 08/31/2011   TSH 1.82 06/03/2011   PSA 0.02* 12/11/2008   INR 0.97 02/06/2010   HGBA1C 6.5 07/22/2011      Assessment & Plan:  See problem list. Medications and labs reviewed today.  headache - episodic pain shooting on L side, last 10-15"; chronic L side mild deficit without change, neuro exam otherwise  benign - but given hx CVA presentation with "same symptoms" will check MRI/MRA  Time spent with pt/family today 25 minutes, greater than 50% time spent counseling patient on  headache + medication review.

## 2012-05-05 ENCOUNTER — Telehealth: Payer: Self-pay | Admitting: *Deleted

## 2012-05-05 NOTE — Telephone Encounter (Signed)
Forwarding msg to debra with md response...lmb

## 2012-05-05 NOTE — Telephone Encounter (Signed)
Please ask [facility] to reschedule test at one of the hospitals per wife request - thanks

## 2012-05-05 NOTE — Telephone Encounter (Signed)
Wife called stating does not want to go to [facility] for husband MRI or other test. She had bad experience with them before. Was contacted by [facility] yesterday refuse to schedule with them wanting referrals set-up some where else...Raechel Chute

## 2012-05-06 ENCOUNTER — Other Ambulatory Visit: Payer: Self-pay | Admitting: Internal Medicine

## 2012-05-09 ENCOUNTER — Ambulatory Visit (HOSPITAL_COMMUNITY)
Admission: RE | Admit: 2012-05-09 | Discharge: 2012-05-09 | Disposition: A | Discharge status: Home / Self Care | Payer: Medicare Other | Source: Ambulatory Visit | Attending: Internal Medicine | Admitting: Internal Medicine

## 2012-05-09 ENCOUNTER — Ambulatory Visit: Payer: Medicare Other | Admitting: Internal Medicine

## 2012-05-09 DIAGNOSIS — G319 Degenerative disease of nervous system, unspecified: Secondary | ICD-10-CM | POA: Insufficient documentation

## 2012-05-09 DIAGNOSIS — I672 Cerebral atherosclerosis: Secondary | ICD-10-CM | POA: Insufficient documentation

## 2012-05-09 DIAGNOSIS — I658 Occlusion and stenosis of other precerebral arteries: Secondary | ICD-10-CM | POA: Insufficient documentation

## 2012-05-09 DIAGNOSIS — I6350 Cerebral infarction due to unspecified occlusion or stenosis of unspecified cerebral artery: Secondary | ICD-10-CM

## 2012-05-09 DIAGNOSIS — R51 Headache: Secondary | ICD-10-CM

## 2012-05-09 DIAGNOSIS — Z8673 Personal history of transient ischemic attack (TIA), and cerebral infarction without residual deficits: Secondary | ICD-10-CM | POA: Insufficient documentation

## 2012-05-09 DIAGNOSIS — E785 Hyperlipidemia, unspecified: Secondary | ICD-10-CM | POA: Insufficient documentation

## 2012-05-09 DIAGNOSIS — I6529 Occlusion and stenosis of unspecified carotid artery: Secondary | ICD-10-CM | POA: Insufficient documentation

## 2012-05-26 ENCOUNTER — Other Ambulatory Visit: Payer: Self-pay | Admitting: Internal Medicine

## 2012-05-30 ENCOUNTER — Other Ambulatory Visit: Payer: Self-pay | Admitting: Internal Medicine

## 2012-06-08 ENCOUNTER — Other Ambulatory Visit: Payer: Self-pay | Admitting: *Deleted

## 2012-06-08 MED ORDER — METHYLDOPA 500 MG PO TABS: ORAL_TABLET | ORAL | Status: DC

## 2012-06-09 ENCOUNTER — Telehealth: Payer: Self-pay | Admitting: Internal Medicine

## 2012-06-09 NOTE — Telephone Encounter (Signed)
Is this ok. Pls advise...[name]

## 2012-06-09 NOTE — Telephone Encounter (Signed)
Per chart med was filled by Dr. Tenny Craw on yesterday 06/08/12. Notified pt...lmb

## 2012-06-09 NOTE — Telephone Encounter (Signed)
Dr. Leonides Schanz does not prescribe this for him. He needs to call Dr. Tenny Craw

## 2012-06-09 NOTE — Telephone Encounter (Signed)
Patient is out of methyldopa - needs rx called in to [facility] on [address]

## 2012-06-11 ENCOUNTER — Other Ambulatory Visit: Payer: Self-pay | Admitting: Internal Medicine

## 2012-06-13 ENCOUNTER — Telehealth: Payer: Self-pay | Admitting: Internal Medicine

## 2012-06-13 ENCOUNTER — Other Ambulatory Visit: Payer: Self-pay | Admitting: *Deleted

## 2012-06-13 NOTE — Telephone Encounter (Signed)
Follow UP  [name] from [facility] called to alert you that the prescription for methyldopa has been approved. It was approved on May 31 and is approved from 1 full year. She said that an approval letter will be mailed to the office as well.

## 2012-06-13 NOTE — Telephone Encounter (Signed)
Noted  

## 2012-06-15 ENCOUNTER — Other Ambulatory Visit: Payer: Self-pay | Admitting: Internal Medicine

## 2012-06-17 ENCOUNTER — Ambulatory Visit: Payer: Medicare Other | Admitting: Internal Medicine

## 2012-07-04 ENCOUNTER — Other Ambulatory Visit: Payer: Self-pay | Admitting: Internal Medicine

## 2012-07-05 ENCOUNTER — Telehealth: Payer: Self-pay | Admitting: *Deleted

## 2012-07-05 ENCOUNTER — Other Ambulatory Visit: Payer: Self-pay

## 2012-07-05 MED ORDER — CLOPIDOGREL BISULFATE 75 MG PO TABS: 75.0000 mg | ORAL_TABLET | Freq: Every day | ORAL | Status: DC

## 2012-07-05 NOTE — Telephone Encounter (Signed)
Pt called requesting medication refill of Tramadol; called pt informed him that Rx was already sent/received by Pharmacy.

## 2012-07-05 NOTE — Telephone Encounter (Signed)
Pt call to check on med request. Pt stated that there is an emergency (death in family) and they have to travel tomorrow. Please advise.

## 2012-07-08 ENCOUNTER — Other Ambulatory Visit: Payer: Self-pay | Admitting: *Deleted

## 2012-07-08 MED ORDER — CLOPIDOGREL BISULFATE 75 MG PO TABS: 75.0000 mg | ORAL_TABLET | Freq: Every day | ORAL | Status: DC

## 2012-07-22 ENCOUNTER — Other Ambulatory Visit: Payer: Self-pay | Admitting: Internal Medicine

## 2012-08-04 ENCOUNTER — Other Ambulatory Visit: Payer: Self-pay | Admitting: Internal Medicine

## 2012-08-05 ENCOUNTER — Other Ambulatory Visit: Payer: Self-pay | Admitting: *Deleted

## 2012-08-05 ENCOUNTER — Other Ambulatory Visit: Payer: Self-pay | Admitting: Internal Medicine

## 2012-08-05 MED ORDER — ATORVASTATIN CALCIUM 40 MG PO TABS: 40.0000 mg | ORAL_TABLET | Freq: Every day | ORAL | Status: DC

## 2012-08-05 MED ORDER — AMLODIPINE BESYLATE 10 MG PO TABS: 10.0000 mg | ORAL_TABLET | Freq: Every day | ORAL | Status: DC

## 2012-08-05 NOTE — Telephone Encounter (Signed)
Talked with patient 's wife to verify which statin he is currently taking. She states he is take Lipitor not Simvastatin and for right now would like for [facility] [address]. She wants them to eventually go thru [facility].

## 2012-08-05 NOTE — Telephone Encounter (Signed)
He is taking Atorvastatin daily. And his wife states she has asked for his meds to go through [facility]. So [facility] will be sending both PCP and Cardiology New Rx forms.   [name].

## 2012-08-05 NOTE — Telephone Encounter (Signed)
My note may have mistaken which statin he takes as simva was DC's from med list in 2013 I would recommended to call and verify with patient's wife which statin he is taking.  Okay to refill atorva if not on simva  thanks

## 2012-08-09 ENCOUNTER — Telehealth: Payer: Self-pay | Admitting: *Deleted

## 2012-08-09 NOTE — Telephone Encounter (Signed)
Left msg on vm stating husband been c/o of being weak,, no energy, he looks pale in the face. Would like to come in for blood work. Called pt wife back inform her pt would need to make appt to be evaluated first before md can oprder labs. Made appt for tomorrow @ 3:45...lmb

## 2012-08-10 ENCOUNTER — Other Ambulatory Visit (INDEPENDENT_AMBULATORY_CARE_PROVIDER_SITE_OTHER): Payer: Medicare Other

## 2012-08-10 ENCOUNTER — Encounter: Payer: Self-pay | Admitting: Internal Medicine

## 2012-08-10 ENCOUNTER — Ambulatory Visit (INDEPENDENT_AMBULATORY_CARE_PROVIDER_SITE_OTHER): Payer: Medicare Other | Admitting: Internal Medicine

## 2012-08-10 VITALS — BP 152/60 | HR 63 | Temp 96.9°F | Wt 180.4 lb

## 2012-08-10 DIAGNOSIS — R5383 Other fatigue: Secondary | ICD-10-CM

## 2012-08-10 DIAGNOSIS — R5381 Other malaise: Secondary | ICD-10-CM

## 2012-08-10 DIAGNOSIS — I10 Essential (primary) hypertension: Secondary | ICD-10-CM

## 2012-08-10 DIAGNOSIS — G8929 Other chronic pain: Secondary | ICD-10-CM

## 2012-08-10 LAB — CBC WITH DIFFERENTIAL/PLATELET
Basophils Absolute: 0 10*3/uL (ref 0.0–0.1)
Eosinophils Relative: 3.8 % (ref 0.0–5.0)
Monocytes Relative: 8.6 % (ref 3.0–12.0)
Neutrophils Relative %: 69.4 % (ref 43.0–77.0)
Platelets: 265 10*3/uL (ref 150.0–400.0)
RDW: 14 % (ref 11.5–14.6)
WBC: 6.1 10*3/uL (ref 4.5–10.5)

## 2012-08-10 MED ORDER — TRAMADOL HCL 50 MG PO TABS: ORAL_TABLET | ORAL | Status: DC

## 2012-08-10 NOTE — Progress Notes (Signed)
  Subjective:    Patient ID: [name], male    DOB: Dec 14, 1929, 79 y.o.   MRN: [id number]  HPI  complains of fatigue Ongoing x 2-3 weeks  Past Medical History  Diagnosis Date  . HTN (hypertension)     Negative renal duplex 07-22-11  . 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 Esophageal stricture     Distal, benign  . Diverticulosis   . GERD (gastroesophageal reflux disease)   . Hemorrhoids   . Allergic rhinitis   . Prostate cancer 2000    Seed XRT  . Dyslipidemia   . Chronic pain syndrome     Left shoulder, back  . Peripheral vascular disease     Left CEA 2002  . Asthma   . Anemia     Review of Systems  Constitutional: Positive for fatigue. Negative for fever.  Eyes: Negative for discharge and visual disturbance.  Respiratory: Negative for shortness of breath.   Cardiovascular: Negative for chest pain, palpitations and leg swelling.  Neurological: Negative for syncope and light-headedness.       Objective:   Physical Exam  BP 152/60  Pulse 63  Temp(Src) 96.9 F (36.1 C) (Oral)  Wt 180 lb 6.4 oz (81.829 kg)  BMI 25.88 kg/m2  SpO2 97% Wt Readings from Last 3 Encounters:  08/10/12 180 lb 6.4 oz (81.829 kg)  05/04/12 180 lb 3.2 oz (81.738 kg)  03/29/12 176 lb 9.6 oz (80.105 kg)   Constitutional:  He appears well-developed and well-nourished. No distress. Wife at side Neck: Normal range of motion. Neck supple. No JVD present. No thyromegaly present.  Cardiovascular: Normal rate, regular rhythm and normal heart sounds.  No murmur heard. no BLE edema Pulmonary/Chest: Effort normal and breath sounds normal. No respiratory distress. no wheezes.  Neurological: he is alert and oriented to person, place, and time. No cranial nerve deficit. MAE with good strength. Coordination, speech, balance and recall are normal/baseline. chronic antalgic gait from back issues and LLE/quad 4+/5 from remote CVA Psychiatric: he has a normal mood (slightly anxious) and  affect. behavior is normal. Judgment and thought content normal.   Lab Results  Component Value Date   WBC 6.7 05/04/2012   HGB 12.5* 05/04/2012   HCT 36.4* 05/04/2012   PLT 284.0 05/04/2012   GLUCOSE 91 05/04/2012   CHOL 98 05/04/2012   TRIG 128.0 05/04/2012   HDL 28.70* 05/04/2012   LDLCALC 44 05/04/2012   ALT 12 05/04/2012   AST 17 05/04/2012   NA 136 05/04/2012   K 5.7* 05/04/2012   CL 104 05/04/2012   CREATININE 1.2 05/04/2012   BUN 29* 05/04/2012   CO2 28 05/04/2012   TSH 3.31 05/04/2012   PSA 0.02* 12/11/2008   INR 0.97 02/06/2010   HGBA1C 6.4 05/04/2012      Assessment & Plan:  See problem list. Medications and labs reviewed today.  Fatigue - nonspecific symptoms/exam - check screening labs  Time spent with pt/family today 25 minutes, greater than 50% time spent counseling patient on fatigue + medication review.

## 2012-08-10 NOTE — Assessment & Plan Note (Signed)
Chronic pain syndrome related to back and left shoulder. Prior steroid injections by orthopedics ([facility]) with limited relief Reviewed prior history of substance abuse including alcohol and narcotics. Wife reviews and confirms same. I explained to patient we would help with his symptoms through alternative management and avoid narcotic use. Continue tramadol prn and steroid shots as needed - max 6 tabs/day Can consider referral to pain clinic in future if needed.

## 2012-08-10 NOTE — Assessment & Plan Note (Signed)
BP Readings from Last 3 Encounters:  08/10/12 152/60  05/04/12 132/60  03/29/12 147/69  change amlodipine to 5mg  bid (currently 10mg  qd) The current medical regimen is effective;  continue present plan and medications.

## 2012-08-10 NOTE — Patient Instructions (Signed)
It was good to see you today. We have reviewed your prior records including labs and tests today Test(s) ordered today. Your results will be released to MyChart (or called to you) after review, usually within 72hours after test completion. If any changes need to be made, you will be notified at that same time. Change amlodipine to half tablet twice daily (  5 mg a.m. And 5 mg p.m.) -no other changes

## 2012-08-11 ENCOUNTER — Other Ambulatory Visit: Payer: Self-pay | Admitting: Internal Medicine

## 2012-08-12 ENCOUNTER — Telehealth: Payer: Self-pay | Admitting: *Deleted

## 2012-08-12 NOTE — Telephone Encounter (Signed)
Pt called requesting lab results.  Please advise in Dr Laverna Peace absence.

## 2012-08-15 NOTE — Telephone Encounter (Signed)
Blood counts are stable. He does have a mild anemia, this could contribute to his fatigue, but we will still need to consider other causes as well.

## 2012-08-15 NOTE — Telephone Encounter (Signed)
Spoke with pt.  Advised of result note. 

## 2012-08-16 ENCOUNTER — Other Ambulatory Visit: Payer: Self-pay | Admitting: Internal Medicine

## 2012-08-31 ENCOUNTER — Other Ambulatory Visit: Payer: Self-pay | Admitting: Internal Medicine

## 2012-09-02 ENCOUNTER — Encounter: Payer: Self-pay | Admitting: Internal Medicine

## 2012-09-02 ENCOUNTER — Ambulatory Visit (INDEPENDENT_AMBULATORY_CARE_PROVIDER_SITE_OTHER): Payer: Medicare Other | Admitting: Internal Medicine

## 2012-09-02 VITALS — BP 119/55 | HR 59 | Ht 70.0 in | Wt 178.4 lb

## 2012-09-02 DIAGNOSIS — I10 Essential (primary) hypertension: Secondary | ICD-10-CM

## 2012-09-02 DIAGNOSIS — R0989 Other specified symptoms and signs involving the circulatory and respiratory systems: Secondary | ICD-10-CM

## 2012-09-02 DIAGNOSIS — R609 Edema, unspecified: Secondary | ICD-10-CM

## 2012-09-02 DIAGNOSIS — D649 Anemia, unspecified: Secondary | ICD-10-CM

## 2012-09-02 LAB — BASIC METABOLIC PANEL
CO2: 26 mEq/L (ref 19–32)
Glucose, Bld: 139 mg/dL — ABNORMAL HIGH (ref 70–99)
Potassium: 5.4 mEq/L — ABNORMAL HIGH (ref 3.5–5.3)
Sodium: 138 mEq/L (ref 135–145)

## 2012-09-02 LAB — CBC WITH DIFFERENTIAL/PLATELET
Basophils Absolute: 0 10*3/uL (ref 0.0–0.1)
Lymphocytes Relative: 18 % (ref 12–46)
Neutro Abs: 5 10*3/uL (ref 1.7–7.7)
Neutrophils Relative %: 70 % (ref 43–77)
Platelets: 247 10*3/uL (ref 150–400)
RDW: 14.5 % (ref 11.5–15.5)
WBC: 7.2 10*3/uL (ref 4.0–10.5)

## 2012-09-02 LAB — BRAIN NATRIURETIC PEPTIDE: Brain Natriuretic Peptide: 35.9 pg/mL (ref 0.0–100.0)

## 2012-09-02 NOTE — Progress Notes (Signed)
HPI Patient is a 79 year old with a history of HTN (difficult to control in past). He was last in clinic on 09/2011.  He also has a history of reactive airway disease Since I saw hime he has been  followed regularly by [facility]   Review of BP over last couple months show relatively good measurements. The patient and his wife say he sleeps a lot  Erratic sleep, stays up late, sleeps late. He denies CP  Breathing is OK Last med modification   Amlodipine changed t o5 bid. Allergies  Allergen Reactions  . Codeine   . Diovan [Valsartan]     hyperkalemia  . Hctz [Hydrochlorothiazide]     Swelling and dyspnea   . Oxycodone-Acetaminophen     Current Outpatient Prescriptions  Medication Sig Dispense Refill  . albuterol (PROVENTIL HFA;VENTOLIN HFA) 108 (90 BASE) MCG/ACT inhaler Inhale 2 puffs into the lungs every 6 (six) hours as needed for wheezing.  1 Inhaler  3  . amLODipine (NORVASC) 10 MG tablet REQUESTING #90 LAST FILLED 2/25 VIA MAIL ORDER  90 tablet  0  . atorvastatin (LIPITOR) 40 MG tablet TAKE 1 TABLET (40 MG TOTAL) BY MOUTH DAILY.  30 tablet  6  . budesonide-formoterol (SYMBICORT) 160-4.5 MCG/ACT inhaler Inhale 2 puffs into the lungs 2 (two) times daily.  1 Inhaler  3  . cloNIDine (CATAPRES) 0.1 MG tablet TAKE 1 TABLET (0.1 MG TOTAL) BY MOUTH DAILY AT 10 PM.  30 tablet  5  . cloNIDine (CATAPRES) 0.3 MG tablet TAKE 1 TABLET BY MOUTH TWICE A DAY  180 tablet  5  . clopidogrel (PLAVIX) 75 MG tablet Take 1 tablet (75 mg total) by mouth daily.  90 tablet  2  . dicyclomine (BENTYL) 10 MG capsule Take one tablet BID  60 capsule  1  . Fe Fum-FA-B Cmp-C-Zn-Mg-Mn-Cu (CENTRATEX) 106-1 MG CAPS Take 2 capsule daily for low iron      . Ferrous Sulfate Dried (FERROUS SULFATE CR PO) Take by mouth 2 (two) times daily.      [name] Kitchen gabapentin (NEURONTIN) 300 MG capsule TAKE ONE CAPSULE 3 TIMES A DAY  90 capsule  3  . methyldopa (ALDOMET) 500 MG tablet 2 tabs in the AM ,1 tab mid day, 3 tabs at bedtime  180  tablet  0  . omeprazole (PRILOSEC) 40 MG capsule Take 1 capsule (40 mg total) by mouth daily.  90 capsule  3  . polyethylene glycol (MIRALAX / GLYCOLAX) packet Take 17 g by mouth as needed.       . senna-docusate (SENOKOT-S) 8.6-50 MG per tablet Take 1 tablet by mouth as needed.        [name] Kitchen spironolactone (ALDACTONE) 25 MG tablet TAKE 1/2 TABLET BY MOUTH EVERY DAY  30 tablet  6  . traMADol (ULTRAM) 50 MG tablet TAKE 1 TO 2 TABLETS BY MOUTH EVERY 8 HOURS IF NEEDED FOR PAIN (MAX 6 TABLETS PER DAY)  180 tablet  1  . triamcinolone cream (KENALOG) 0.5 % Apply topically 3 (three) times daily.  30 g  1  . [DISCONTINUED] valsartan (DIOVAN) 160 MG tablet Take 160 mg by mouth daily.         No current facility-administered medications for this visit.    Past Medical History  Diagnosis Date  . HTN (hypertension)     Negative renal duplex 07-22-11  . 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 Esophageal stricture     Distal,  benign  . Diverticulosis   . GERD (gastroesophageal reflux disease)   . Hemorrhoids   . Allergic rhinitis   . Prostate cancer 2000    Seed XRT  . Dyslipidemia   . Chronic pain syndrome     Left shoulder, back  . Peripheral vascular disease     Left CEA 2002  . Asthma   . Anemia     Past Surgical History  Procedure Laterality Date  . Back surgery  8/07    cervical spine  . Back surgery      lumbar  . Appendectomy    . Cataract extraction      bilateral  . Insertion prostate radiation seed  2000  . Carotid endarterectomy  2002    L cea  . Nasal sinus surgery      Family History  Problem Relation Age of Onset  . Lung cancer Brother   . Stroke Sister     History   Social History  . Marital Status: Married    Spouse Name: N/A    Number of Children: 1  . Years of Education: N/A   Occupational History  . retired   .     Social History Main Topics  . Smoking status: Former Smoker -- 35 years    Types: Cigarettes    Quit date:  01/12/1978  . Smokeless tobacco: Current User    Types: Chew  . Alcohol Use: No     Comment: Prior alcoholic, sober since 2002  . Drug Use: No  . Sexual Activity: Not Currently   Other Topics Concern  . Not on file   Social History Narrative  . No narrative on file    Review of Systems:  All systems reviewed.  They are negative to the above problem except as previously stated.  Vital Signs: BP 119/55  Pulse 59  Ht 5\' 10"  (1.778 m)  Wt 178 lb 6.4 oz (80.922 kg)  BMI 25.6 kg/m2  Physical Exam Patient is in NAD HEENT:  Normocephalic, atraumatic. EOMI, PERRLA.  Neck: JVP is normal.  No bruits.  Lungs: Diffuse wheezes, rhonchi  Moving air. Heart: Regular rate and rhythm. Normal S1, S2. No S3.   No significant murmurs. PMI not displaced.  Abdomen:  Supple, nontender. Normal bowel sounds. No masses. No hepatomegaly.  Extremities:   Good distal pulses throughout. No lower extremity edema.  Musculoskeletal :moving all extremities.  Neuro:   alert and oriented x3.  CN II-XII grossly intact.   Assessment and Plan:  1.  HTN  BP is much better.  They do not appear to be checking it as much at home  Last year was very difficult  BP was up/down.   I question if related to compliance  Luckily he seems to be doing better  I would make no changes  2.  Wheezing.  He has been seen in allergy clnic  I have not seen evaluation.  He is wheezing today Wife says he does not use his inhalers, particularly the steroid inhalers.  Reinforced with him the need to do this.  3.Hx CVA  Continue ASA and Plavix.  4.  HL  COntinue lipitor.  F/U next summer.

## 2012-09-02 NOTE — Patient Instructions (Addendum)
Your physician recommends that you continue on your current medications as directed. Please refer to the Current Medication list given to you today.  Your physician recommends that you return for lab work in: today  Your physician wants you to follow-up in: 1 year. You will receive a reminder letter in the mail two months in advance. If you don't receive a letter, please call our office to schedule the follow-up appointment.    

## 2012-09-05 ENCOUNTER — Telehealth: Payer: Self-pay | Admitting: *Deleted

## 2012-09-05 DIAGNOSIS — E875 Hyperkalemia: Secondary | ICD-10-CM

## 2012-09-05 NOTE — Telephone Encounter (Signed)
Advised wife of labs, med change, and bmet next week

## 2012-09-05 NOTE — Telephone Encounter (Signed)
Message copied by Burnell Blanks on Mon Sep 05, 2012  4:53 PM ------      Message from: Dietrich Pates V      Created: Mon Sep 05, 2012 12:19 AM       K is a little high      Stop aldactone      Check BMET in 1 wk. ------

## 2012-09-07 ENCOUNTER — Other Ambulatory Visit: Payer: Self-pay | Admitting: Internal Medicine

## 2012-09-08 NOTE — Telephone Encounter (Signed)
[facility].../lmb 

## 2012-09-13 ENCOUNTER — Other Ambulatory Visit (INDEPENDENT_AMBULATORY_CARE_PROVIDER_SITE_OTHER): Payer: Medicare Other

## 2012-09-13 DIAGNOSIS — E875 Hyperkalemia: Secondary | ICD-10-CM

## 2012-09-13 LAB — BASIC METABOLIC PANEL
Chloride: 105 mEq/L (ref 96–112)
Creatinine, Ser: 1.1 mg/dL (ref 0.4–1.5)
Potassium: 4.4 mEq/L (ref 3.5–5.1)
Sodium: 135 mEq/L (ref 135–145)

## 2012-10-06 ENCOUNTER — Telehealth: Payer: Self-pay | Admitting: *Deleted

## 2012-10-06 NOTE — Telephone Encounter (Signed)
Jim,pharmacist called states pt is out of town requesting a refill on Tramadol 50mg .  Last refill 8.27.14.  Please advise

## 2012-10-07 MED ORDER — TRAMADOL HCL 50 MG PO TABS: ORAL_TABLET | ORAL | Status: DC

## 2012-10-07 NOTE — Telephone Encounter (Signed)
Ok to refill 

## 2012-10-13 ENCOUNTER — Other Ambulatory Visit: Payer: Self-pay | Admitting: Pulmonary Disease

## 2012-10-14 ENCOUNTER — Other Ambulatory Visit: Payer: Self-pay | Admitting: Pulmonary Disease

## 2012-10-19 ENCOUNTER — Other Ambulatory Visit: Payer: Self-pay | Admitting: *Deleted

## 2012-10-19 MED ORDER — ALBUTEROL SULFATE HFA 108 (90 BASE) MCG/ACT IN AERS: 2.0000 | INHALATION_SPRAY | Freq: Four times a day (QID) | RESPIRATORY_TRACT | Status: DC | PRN

## 2012-10-19 NOTE — Telephone Encounter (Signed)
Pt was last seen by RA on 10/08/11. No pending appts. I have sent rx x 1 with instructions that pt needs OV for further refills.

## 2012-10-20 ENCOUNTER — Telehealth: Payer: Self-pay | Admitting: Pulmonary Disease

## 2012-10-20 NOTE — Telephone Encounter (Signed)
LMTCB

## 2012-10-21 MED ORDER — MONTELUKAST SODIUM 10 MG PO TABS: 10.0000 mg | ORAL_TABLET | Freq: Every day | ORAL | Status: DC

## 2012-10-21 NOTE — Telephone Encounter (Signed)
Spoke with spouse  I have sent med to pharm  Changed appt from RA to TP

## 2012-10-21 NOTE — Telephone Encounter (Signed)
Last OV with RA: 10/08/11 No pending OV I do not see this medication on pt's current med list lmomtcb for pt

## 2012-10-21 NOTE — Telephone Encounter (Signed)
Spoke with the pt's spouse  I have set the pt up for ov in Nov since he is overdue for appt  Spouse states that the pt needs rx for singulair- previously prescribed by [place] Callas but now he is refusing to refill since we see the pt Please advise if we can call this in  Pt has been out of med x several days and wants this handled today Thanks!

## 2012-10-21 NOTE — Telephone Encounter (Signed)
OK to refill x2 Pl make appt with TP

## 2012-10-21 NOTE — Telephone Encounter (Addendum)
Pt's spouse returned call & asked to be reached at [phone].  Pt's spouse states he has been w/out this med for a few days.  States Dr. [place] Callas previously prescribed this, but refused to refill since pt now has a pulmonary doctor.  Pt's spouse is very concerned that this has happened & would like to speak w/ someone asap.  Antionette Fairy

## 2012-10-27 ENCOUNTER — Telehealth: Payer: Self-pay | Admitting: Pulmonary Disease

## 2012-10-27 MED ORDER — BUDESONIDE-FORMOTEROL FUMARATE 160-4.5 MCG/ACT IN AERO: 2.0000 | INHALATION_SPRAY | Freq: Two times a day (BID) | RESPIRATORY_TRACT | Status: DC

## 2012-10-27 MED ORDER — MONTELUKAST SODIUM 10 MG PO TABS: 10.0000 mg | ORAL_TABLET | Freq: Every day | ORAL | Status: DC

## 2012-10-27 MED ORDER — ALBUTEROL SULFATE HFA 108 (90 BASE) MCG/ACT IN AERS: 2.0000 | INHALATION_SPRAY | Freq: Four times a day (QID) | RESPIRATORY_TRACT | Status: DC | PRN

## 2012-10-27 NOTE — Telephone Encounter (Signed)
I spoke with pt and is aware rx's will be sent. Nothing further needed

## 2012-11-01 ENCOUNTER — Other Ambulatory Visit: Payer: Self-pay | Admitting: *Deleted

## 2012-11-01 MED ORDER — CLONIDINE HCL 0.3 MG PO TABS: ORAL_TABLET | ORAL | Status: DC

## 2012-11-02 ENCOUNTER — Telehealth: Payer: Self-pay | Admitting: *Deleted

## 2012-11-02 NOTE — Telephone Encounter (Signed)
Patients wife called because the patients [facility]yld[facility] Dr Tenny Craw changed it over a year ago to 2 in the morning, 1 at mid day and 3 at bedtime.

## 2012-11-03 ENCOUNTER — Other Ambulatory Visit: Payer: Self-pay | Admitting: Internal Medicine

## 2012-11-04 NOTE — Telephone Encounter (Signed)
[facility].../lmb 

## 2012-11-04 NOTE — Telephone Encounter (Signed)
Md out of office. Pls advise on refill.../lmb 

## 2012-11-07 ENCOUNTER — Ambulatory Visit (INDEPENDENT_AMBULATORY_CARE_PROVIDER_SITE_OTHER): Payer: Medicare Other | Admitting: Adult Health

## 2012-11-07 ENCOUNTER — Encounter: Payer: Self-pay | Admitting: Adult Health

## 2012-11-07 VITALS — BP 104/62 | HR 51 | Temp 98.2°F | Ht 68.5 in | Wt 179.0 lb

## 2012-11-07 DIAGNOSIS — J45909 Unspecified asthma, uncomplicated: Secondary | ICD-10-CM

## 2012-11-07 NOTE — Patient Instructions (Signed)
Continue on Symbicort 160 2puffs twice daily, rinse after use.  Use albuterol inhaler 2 puffs every 6h as needed only for shortness of breath or wheezing (rescue) Follow up Dr. Vassie Loll  In 6 months and As needed

## 2012-11-07 NOTE — Assessment & Plan Note (Addendum)
Compensated on present regimen  Encouraged to take symbicort every day (Twice daily  ) .   Plan  Continue on Symbicort 160 2puffs twice daily, rinse after use.  Use albuterol inhaler 2 puffs every 6h as needed only for shortness of breath or wheezing (rescue) Follow up Dr. Vassie Loll  In 6 months and As needed

## 2012-11-07 NOTE — Progress Notes (Signed)
  Subjective:    Patient ID: [name], male    DOB: 12-24-1929, 79 y.o.   MRN: [id number]  HPI 79 year old remote smoker presents for evaluation of wheezing and dyspnea. Smoked 2 packs per week for about 30 years before he quit in 1980. He reports asthma as a child which recurred as an adult but has not bothered him in the last decade. He reports wheezing for the past few months but denies dyspnea on exertion. He does admit that is not very active but can walk across the parking lot and in the store. Dr. Tenny Craw started him on Flovent and albuterol when necessary for wheezing. He underwent allergy evaluation by Dr. [place] Callas , allergy testing was nondiagnostic, he was started on Singulair and general allergy avoidance measures were recommended. CXR 1/12 showed clear lungs & Anterior bowing of the sternum  Spirometry showed moderate airway obstruction with FEV1 62%, FVC 68% and ratio of 65.  Environment- he lives in a 11 year old home with cats, hardwood floors, 51-year-old mattress and central air conditioning, and obvious exposure to mold He denies sinus drainage or obvious heartburn.  11/07/2012 Follow Up Asthma  Returns for yearly follow up Asthma - does report some increased wheezing with the weather change. Was started on Symbicort last ov. Says does well . Does not take everyday. No increased SABA use.  Says singulair helps with allergy symptoms.  No fever , chest pain, orthopnea, or edema.    Review of Systems  Constitutional: Negative for fever, appetite change and unexpected weight change.  HENT:  Negative for ear pain, sore throat, sneezing and dental problem.   Respiratory: Negative for cough and shortness of breath.   Cardiovascular:   Negative for chest pain and palpitations.  Gastrointestinal: Negative for abdominal pain.  Musculoskeletal: Negative for joint swelling.  Skin: Negative for rash.  Neurological: Negative for headaches.  Psychiatric/Behavioral: Negative for  dysphoric mood. The patient is not nervous/anxious.        Objective:   Physical Exam  Gen. Pleasant, well-nourished, in no distress, normal affect ENT - no lesions, no post nasal drip Neck: No JVD, no thyromegaly, no carotid bruits Lungs: no use of accessory muscles, no dullness to percussion, no rales, clear , no wheezing  Cardiovascular: Rhythm regular, heart sounds  normal, no murmurs or gallops, no peripheral edema Abdomen: soft and non-tender, no hepatosplenomegaly, BS normal. Musculoskeletal: No deformities, no cyanosis or clubbing Neuro:  alert, non focal        Assessment & Plan:

## 2012-11-15 NOTE — Telephone Encounter (Signed)
Spoke with patient again, he gets med from pcp, he will call them.

## 2012-12-05 ENCOUNTER — Other Ambulatory Visit: Payer: Self-pay | Admitting: Internal Medicine

## 2012-12-06 NOTE — Telephone Encounter (Signed)
Medication phoned in.

## 2012-12-06 NOTE — Telephone Encounter (Signed)
Please advise if ok to fill,  Thanks!

## 2012-12-06 NOTE — Telephone Encounter (Signed)
Ok to phone in.

## 2012-12-09 ENCOUNTER — Ambulatory Visit: Payer: Medicare Other | Admitting: Pulmonary Disease

## 2012-12-16 ENCOUNTER — Ambulatory Visit (INDEPENDENT_AMBULATORY_CARE_PROVIDER_SITE_OTHER): Payer: Medicare Other | Admitting: Internal Medicine

## 2012-12-16 ENCOUNTER — Encounter: Payer: Self-pay | Admitting: Internal Medicine

## 2012-12-16 VITALS — BP 116/56 | HR 61 | Ht 71.0 in | Wt 182.0 lb

## 2012-12-16 DIAGNOSIS — I6350 Cerebral infarction due to unspecified occlusion or stenosis of unspecified cerebral artery: Secondary | ICD-10-CM

## 2012-12-16 DIAGNOSIS — I10 Essential (primary) hypertension: Secondary | ICD-10-CM

## 2012-12-16 DIAGNOSIS — I639 Cerebral infarction, unspecified: Secondary | ICD-10-CM

## 2012-12-16 DIAGNOSIS — E785 Hyperlipidemia, unspecified: Secondary | ICD-10-CM

## 2012-12-16 MED ORDER — METHYLDOPA 500 MG PO TABS: ORAL_TABLET | ORAL | Status: DC

## 2012-12-16 MED ORDER — AMLODIPINE BESYLATE 5 MG PO TABS: 5.0000 mg | ORAL_TABLET | Freq: Every day | ORAL | Status: DC

## 2012-12-16 NOTE — Patient Instructions (Addendum)
Your physician wants you to follow-up in: MAY 2015 WITH DR Tenny Craw You will receive a reminder letter in the mail two months in advance. If you don't receive a letter, please call our office to schedule the follow-up appointment.   DECREASE AMLODIPINE TO 5 MG ONCE DAILY  DECREASE METHYLDOPA TO 2 TABLETS AT BEDTIME  EAT MORE PROTEIN

## 2012-12-16 NOTE — Progress Notes (Signed)
HPI Patient is a 79 year old with a history of HTN (difficult to control in past). He was last in clinic on 09/2011.  He also has a history of reactive airway disease I saw himin August SISnce seen he notes dizziness and fatigue about an hour after taking am meds   Allergies  Allergen Reactions  . Codeine   . Diovan [Valsartan]     hyperkalemia  . Hctz [Hydrochlorothiazide]     Swelling and dyspnea   . Oxycodone-Acetaminophen     Current Outpatient Prescriptions  Medication Sig Dispense Refill  . albuterol (PROVENTIL HFA;VENTOLIN HFA) 108 (90 BASE) MCG/ACT inhaler Inhale 2 puffs into the lungs every 6 (six) hours as needed for wheezing.  3 Inhaler  0  . amLODipine (NORVASC) 10 MG tablet REQUESTING #90 LAST FILLED 2/25 VIA MAIL ORDER  90 tablet  0  . atorvastatin (LIPITOR) 40 MG tablet TAKE 1 TABLET (40 MG TOTAL) BY MOUTH DAILY.  30 tablet  6  . budesonide-formoterol (SYMBICORT) 160-4.5 MCG/ACT inhaler Inhale 2 puffs into the lungs 2 (two) times daily.  3 Inhaler  0  . cloNIDine (CATAPRES) 0.1 MG tablet TAKE 1 TABLET (0.1 MG TOTAL) BY MOUTH DAILY AT 10 PM.  30 tablet  5  . cloNIDine (CATAPRES) 0.3 MG tablet TAKE 1 TABLET BY MOUTH TWICE A DAY  180 tablet  3  . clopidogrel (PLAVIX) 75 MG tablet Take 1 tablet (75 mg total) by mouth daily.  90 tablet  2  . docusate sodium (COLACE) 100 MG capsule Take 100 mg by mouth 2 (two) times daily.      . Ferrous Sulfate Dried (FERROUS SULFATE CR PO) Take by mouth 2 (two) times daily.      [name] Kitchen gabapentin (NEURONTIN) 300 MG capsule TAKE ONE CAPSULE 3 TIMES A DAY  90 capsule  3  . methyldopa (ALDOMET) 500 MG tablet 2 tabs in the AM ,1 tab mid day, 3 tabs at bedtime  180 tablet  0  . montelukast (SINGULAIR) 10 MG tablet Take 1 tablet (10 mg total) by mouth at bedtime.  90 tablet  0  . omeprazole (PRILOSEC) 40 MG capsule Take 1 capsule (40 mg total) by mouth daily.  90 capsule  3  . polyethylene glycol (MIRALAX / GLYCOLAX) packet Take 17 g by mouth as needed.        . senna-docusate (SENOKOT-S) 8.6-50 MG per tablet Take 1 tablet by mouth as needed.        . traMADol (ULTRAM) 50 MG tablet TAKE 1-2 TABLETS EVERY 8HRS AS NEEDED MAX 6/24HRS  180 tablet  0  . triamcinolone cream (KENALOG) 0.5 % Apply topically as needed.      . [DISCONTINUED] valsartan (DIOVAN) 160 MG tablet Take 160 mg by mouth daily.         No current facility-administered medications for this visit.    Past Medical History  Diagnosis Date  . HTN (hypertension)     Negative renal duplex 07-22-11  . 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 Esophageal stricture     Distal, benign  . Diverticulosis   . GERD (gastroesophageal reflux disease)   . Hemorrhoids   . Allergic rhinitis   . Prostate cancer 2000    Seed XRT  . Dyslipidemia   . Chronic pain syndrome     Left shoulder, back  . Peripheral vascular disease     Left CEA 2002  . Asthma   . Anemia  Past Surgical History  Procedure Laterality Date  . Back surgery  8/07    cervical spine  . Back surgery      lumbar  . Appendectomy    . Cataract extraction      bilateral  . Insertion prostate radiation seed  2000  . Carotid endarterectomy  2002    L cea  . Nasal sinus surgery      Family History  Problem Relation Age of Onset  . Lung cancer Brother   . Stroke Sister     History   Social History  . Marital Status: Married    Spouse Name: N/A    Number of Children: 1  . Years of Education: N/A   Occupational History  . retired   .     Social History Main Topics  . Smoking status: Former Smoker -- 0.25 packs/day for 35 years    Types: Cigarettes    Quit date: 01/12/1978  . Smokeless tobacco: Current User    Types: Chew  . Alcohol Use: No     Comment: Prior alcoholic, sober since 2002  . Drug Use: No  . Sexual Activity: Not Currently   Other Topics Concern  . Not on file   Social History Narrative  . No narrative on file    Review of Systems:  All systems reviewed.   They are negative to the above problem except as previously stated.  Vital Signs: BP 116/56  Pulse 61  Ht 5\' 11"  (1.803 m)  Wt 182 lb (82.555 kg)  BMI 25.40 kg/m2  Physical Exam Patient is in NAD HEENT:  Normocephalic, atraumatic. EOMI, PERRLA.  Neck: JVP is normal.  No bruits.  Lungs: Diffuse wheezes, rhonchi  Moving air. Heart: Regular rate and rhythm. Normal S1, S2. No S3.   No significant murmurs. PMI not displaced.  Abdomen:  Supple, nontender. Normal bowel sounds. No masses. No hepatomegaly.  Extremities:   Good distal pulses throughout. No lower extremity edema.  Musculoskeletal :moving all extremities.  Neuro:   alert and oriented x3.  CN II-XII grossly intact.   Assessment and Plan:  1.  HTN  BP is OK  He does note some dizziness though in the mid day  I recomm that he decrease norvasc to 5 mg.  Cut back on pm methyldopa to 1Gram  2  Hx CVA  Continue ASA and Plavix.  3.  HL  COntinue lipitor.  F/U in the spring.

## 2013-01-30 ENCOUNTER — Telehealth: Payer: Self-pay | Admitting: *Deleted

## 2013-01-30 DIAGNOSIS — I6529 Occlusion and stenosis of unspecified carotid artery: Secondary | ICD-10-CM

## 2013-01-30 DIAGNOSIS — I10 Essential (primary) hypertension: Secondary | ICD-10-CM

## 2013-01-30 DIAGNOSIS — R32 Unspecified urinary incontinence: Secondary | ICD-10-CM

## 2013-01-30 NOTE — Telephone Encounter (Signed)
Cardiology and urology orders placed as requested

## 2013-01-30 NOTE — Telephone Encounter (Signed)
Spouse phoned & stated that since their present insurance is [facility], patient needs a referral to [facility] [employer].  CB# [phone]

## 2013-01-31 ENCOUNTER — Other Ambulatory Visit: Payer: Self-pay | Admitting: *Deleted

## 2013-01-31 MED ORDER — METHYLDOPA 500 MG PO TABS: ORAL_TABLET | ORAL | Status: DC

## 2013-01-31 NOTE — Telephone Encounter (Signed)
Patient has phoned again, today @ 1357, requesting that his med list by faxed into his mail order pharmacy.

## 2013-02-01 ENCOUNTER — Telehealth: Payer: Self-pay | Admitting: Internal Medicine

## 2013-02-01 DIAGNOSIS — J45909 Unspecified asthma, uncomplicated: Secondary | ICD-10-CM

## 2013-02-01 MED ORDER — CLOPIDOGREL BISULFATE 75 MG PO TABS: 75.0000 mg | ORAL_TABLET | Freq: Every day | ORAL | Status: DC

## 2013-02-01 NOTE — Telephone Encounter (Signed)
Patient called requesting refill on  clopidogrel (PLAVIX) 75 MG tablet  Also stated he need a referral to Neurology (Dr Elsworth Soho) -Please Advise

## 2013-02-01 NOTE — Telephone Encounter (Signed)
Notified pt med sent to pharmacy & md has place referral.../lmb

## 2013-02-01 NOTE — Telephone Encounter (Signed)
[place] for refill Refer to pulm (alva)

## 2013-02-01 NOTE — Telephone Encounter (Signed)
Correction patient stated he thinks Dr Elsworth Soho is Pulmonary doctor

## 2013-02-02 ENCOUNTER — Telehealth: Payer: Self-pay | Admitting: Internal Medicine

## 2013-02-02 ENCOUNTER — Ambulatory Visit: Payer: Medicare Other | Admitting: Internal Medicine

## 2013-02-02 DIAGNOSIS — R194 Change in bowel habit: Secondary | ICD-10-CM

## 2013-02-02 NOTE — Telephone Encounter (Signed)
Patient called requesting a refill on methyldopa (ALDOMET) 500 MG tablet  Please advise

## 2013-02-02 NOTE — Telephone Encounter (Signed)
Pt request referral for Dr. Olevia Perches GI. Pt stated he see Dr. Olevia Perches for 20 years and now he need referral due to [facility]. Please advise.

## 2013-02-03 MED ORDER — METHYLDOPA 500 MG PO TABS: ORAL_TABLET | ORAL | Status: DC

## 2013-02-03 NOTE — Telephone Encounter (Signed)
Medication has been sent to mail service...Johny Chess

## 2013-02-06 ENCOUNTER — Other Ambulatory Visit: Payer: Self-pay | Admitting: Internal Medicine

## 2013-02-06 NOTE — Telephone Encounter (Signed)
done

## 2013-02-07 ENCOUNTER — Other Ambulatory Visit: Payer: Self-pay | Admitting: *Deleted

## 2013-02-07 MED ORDER — MONTELUKAST SODIUM 10 MG PO TABS: 10.0000 mg | ORAL_TABLET | Freq: Every day | ORAL | Status: DC

## 2013-02-07 NOTE — Telephone Encounter (Signed)
[facility].../lmb 

## 2013-02-10 ENCOUNTER — Telehealth: Payer: Self-pay | Admitting: Internal Medicine

## 2013-02-10 NOTE — Telephone Encounter (Signed)
Pt changed the insurance to [facility] now and the ID # is [id number]. Pt is trying to get Methyldopa refill through [facility] but insurance is not going to pay for it unless Dr. Asa Lente give Authorization. [facility] phone # [phone] and Fax # [phone]. Please advise

## 2013-02-10 NOTE — Telephone Encounter (Signed)
Pt is ware.

## 2013-02-10 NOTE — Telephone Encounter (Signed)
Already received and completed PA on Methyldopa waiting on approval status from insurance...[name]

## 2013-02-13 ENCOUNTER — Other Ambulatory Visit: Payer: Self-pay | Admitting: *Deleted

## 2013-02-13 ENCOUNTER — Telehealth: Payer: Self-pay | Admitting: *Deleted

## 2013-02-13 MED ORDER — METHYLDOPA 500 MG PO TABS: ORAL_TABLET | ORAL | Status: DC

## 2013-02-13 MED ORDER — METOPROLOL SUCCINATE ER 100 MG PO TB24: 100.0000 mg | ORAL_TABLET | Freq: Every day | ORAL | Status: DC

## 2013-02-13 NOTE — Telephone Encounter (Signed)
Patient is returning missed call from [place].

## 2013-02-13 NOTE — Telephone Encounter (Signed)
Pt needed a PA on his Methyldopa form was completed and fax back. Received PA back this am med was denied. Place on md desk to review...[name]

## 2013-02-13 NOTE — Telephone Encounter (Signed)
Called pt no answer LMOM RTC.../lmb 

## 2013-02-13 NOTE — Telephone Encounter (Signed)
Please let pt/wife know same Will add Toprol XL 100mg  daily in place of aldomet -erx done If pt/wife feel Aldomet is essential, we will need help from cardiology (dr Harrington Challenger) to get this med approved thanks

## 2013-02-13 NOTE — Telephone Encounter (Signed)
Called pt gave him md response. Pt is not understanding why med is not being authorize. Wife gets on the phone inform her that insurance [employer] sent PA back this morning denying medication. Wife states they have called Dr. Harrington Challenger office twice and was told Dr. Asa Lente need to do authorization. Again explain to wife PA has been done by Dr. Asa Lente insurance still denied coverage. Dr. Harrington Challenger actually rx medication for patient so if he want to continue taking he will have to be the one to try and get medication approve...[name]

## 2013-02-15 ENCOUNTER — Other Ambulatory Visit: Payer: Self-pay | Admitting: Vascular Surgery

## 2013-02-15 ENCOUNTER — Encounter: Payer: Self-pay | Admitting: Internal Medicine

## 2013-02-15 DIAGNOSIS — I6529 Occlusion and stenosis of unspecified carotid artery: Secondary | ICD-10-CM

## 2013-02-15 DIAGNOSIS — Z48812 Encounter for surgical aftercare following surgery on the circulatory system: Secondary | ICD-10-CM

## 2013-02-16 ENCOUNTER — Other Ambulatory Visit: Payer: Self-pay | Admitting: Pulmonary Disease

## 2013-03-08 ENCOUNTER — Other Ambulatory Visit: Payer: Self-pay | Admitting: *Deleted

## 2013-03-08 MED ORDER — ATORVASTATIN CALCIUM 40 MG PO TABS: ORAL_TABLET | ORAL | Status: DC

## 2013-03-27 ENCOUNTER — Encounter: Payer: Self-pay | Admitting: Vascular Surgery

## 2013-03-28 ENCOUNTER — Ambulatory Visit (INDEPENDENT_AMBULATORY_CARE_PROVIDER_SITE_OTHER): Payer: Commercial Managed Care - HMO | Admitting: Vascular Surgery

## 2013-03-28 ENCOUNTER — Encounter: Payer: Self-pay | Admitting: Vascular Surgery

## 2013-03-28 ENCOUNTER — Ambulatory Visit (HOSPITAL_COMMUNITY)
Admission: RE | Admit: 2013-03-28 | Discharge: 2013-03-28 | Disposition: A | Discharge status: Home / Self Care | Payer: Medicare PPO | Source: Ambulatory Visit | Attending: Vascular Surgery | Admitting: Vascular Surgery

## 2013-03-28 VITALS — BP 133/73 | HR 57 | Resp 18 | Ht 70.0 in | Wt 180.2 lb

## 2013-03-28 DIAGNOSIS — Z48812 Encounter for surgical aftercare following surgery on the circulatory system: Secondary | ICD-10-CM

## 2013-03-28 DIAGNOSIS — I6529 Occlusion and stenosis of unspecified carotid artery: Secondary | ICD-10-CM

## 2013-03-28 NOTE — Progress Notes (Signed)
. Today for followup of his right carotid endarterectomy on 02/11/2011. He is here today with his wife. He reports generalized diminished stamina. He has no new cardiac difficulties. He denies any focal neurologic deficits.  Past Medical History  Diagnosis Date  . HTN (hypertension)     Negative renal duplex 07-22-11  . 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 Esophageal stricture     Distal, benign  . Diverticulosis   . GERD (gastroesophageal reflux disease)   . Hemorrhoids   . Allergic rhinitis   . Prostate cancer 2000    Seed XRT  . Dyslipidemia   . Chronic pain syndrome     Left shoulder, back  . Peripheral vascular disease     Left CEA 2002  . Asthma   . Anemia     History  Substance Use Topics  . Smoking status: Former Smoker -- 0.25 packs/day for 35 years    Types: Cigarettes    Quit date: 01/12/1978  . Smokeless tobacco: Current User    Types: Chew  . Alcohol Use: No     Comment: Prior alcoholic, sober since 7253    Family History  Problem Relation Age of Onset  . Lung cancer Brother   . Stroke Sister     Allergies  Allergen Reactions  . Codeine   . Diovan [Valsartan]     hyperkalemia  . Hctz [Hydrochlorothiazide]     Swelling and dyspnea   . Oxycodone-Acetaminophen     Current outpatient prescriptions:albuterol (PROVENTIL HFA;VENTOLIN HFA) 108 (90 BASE) MCG/ACT inhaler, Inhale 2 puffs into the lungs every 6 (six) hours as needed for wheezing., Disp: 3 Inhaler, Rfl: 0;  amLODipine (NORVASC) 5 MG tablet, Take 1 tablet (5 mg total) by mouth daily., Disp: 90 tablet, Rfl: 3;  atorvastatin (LIPITOR) 40 MG tablet, TAKE 1 TABLET (40 MG TOTAL) BY MOUTH DAILY., Disp: 90 tablet, Rfl: 3 budesonide-formoterol (SYMBICORT) 160-4.5 MCG/ACT inhaler, Inhale 2 puffs into the lungs 2 (two) times daily., Disp: 3 Inhaler, Rfl: 0;  cloNIDine (CATAPRES) 0.1 MG tablet, TAKE 1 TABLET (0.1 MG TOTAL) BY MOUTH DAILY AT 10 PM., Disp: 30 tablet, Rfl: 5;  cloNIDine  (CATAPRES) 0.3 MG tablet, TAKE 1 TABLET BY MOUTH TWICE A DAY, Disp: 180 tablet, Rfl: 3 clopidogrel (PLAVIX) 75 MG tablet, Take 1 tablet (75 mg total) by mouth daily., Disp: 90 tablet, Rfl: 2;  docusate sodium (COLACE) 100 MG capsule, Take 100 mg by mouth 2 (two) times daily., Disp: , Rfl: ;  Ferrous Sulfate Dried (FERROUS SULFATE CR PO), Take by mouth 2 (two) times daily., Disp: , Rfl: ;  gabapentin (NEURONTIN) 300 MG capsule, TAKE ONE CAPSULE 3 TIMES A DAY, Disp: 90 capsule, Rfl: 3 methyldopa (ALDOMET) 500 MG tablet, 2 tabs in am, 1 tab mid-day, 2 tab in pm, Disp: 450 tablet, Rfl: 1;  metoprolol succinate (TOPROL XL) 100 MG 24 hr tablet, Take 1 tablet (100 mg total) by mouth daily. Take with or immediately following a meal., Disp: 30 tablet, Rfl: 5;  montelukast (SINGULAIR) 10 MG tablet, TAKE 1 TABLET EVERY DAY AT BEDTIME, Disp: 90 tablet, Rfl: 0 omeprazole (PRILOSEC) 40 MG capsule, Take 1 capsule (40 mg total) by mouth daily., Disp: 90 capsule, Rfl: 3;  polyethylene glycol (MIRALAX / GLYCOLAX) packet, Take 17 g by mouth as needed. , Disp: , Rfl: ;  senna-docusate (SENOKOT-S) 8.6-50 MG per tablet, Take 1 tablet by mouth as needed.  , Disp: , Rfl: ;  traMADol (  ULTRAM) 50 MG tablet, TAKE 1-2 TABLETS EVERY 8HRS AS NEEDED MAX 6/24HRS, Disp: 180 tablet, Rfl: 1 triamcinolone cream (KENALOG) 0.5 %, Apply topically as needed., Disp: , Rfl: ;  [DISCONTINUED] valsartan (DIOVAN) 160 MG tablet, Take 160 mg by mouth daily.  , Disp: , Rfl:   BP 133/73  Pulse 57  Resp 18  Ht 5\' 10"  (1.778 m)  Wt 180 lb 3.2 oz (81.738 kg)  BMI 25.86 kg/m2  Body mass index is 25.86 kg/(m^2).       Physical exam well-developed well-nourished gentleman in no acute distress Carotid arteries without bruits bilaterally. He has a well-healed right carotid endarterectomy incision Heart regular rate rhythm without murmur Chest clear and equal breath sounds bilaterally Neurologically he is grossly intact In without ulcers or  rashes  Carotid duplex today is widely patent endarterectomy on the right with no significant stenosis. There is also no significant stenosis in his contralateral left internal carotid artery.  Impression and plan: Stable followup after carotid endarterectomy in 2002. We will see him again in one year with repeat carotid duplex. His wife are frustrated his inability for his redo stamina but have resolved with this just may be "old age". We will see him again in one year for continued followup

## 2013-03-29 NOTE — Addendum Note (Signed)
Addended by: Mena Goes on: 03/29/2013 10:22 AM   Modules accepted: Orders

## 2013-04-03 ENCOUNTER — Other Ambulatory Visit: Payer: Self-pay | Admitting: *Deleted

## 2013-04-03 MED ORDER — AMLODIPINE BESYLATE 5 MG PO TABS: 5.0000 mg | ORAL_TABLET | Freq: Every day | ORAL | Status: DC

## 2013-04-10 ENCOUNTER — Other Ambulatory Visit: Payer: Self-pay | Admitting: Internal Medicine

## 2013-04-11 ENCOUNTER — Other Ambulatory Visit: Payer: Self-pay | Admitting: Internal Medicine

## 2013-04-11 NOTE — Telephone Encounter (Signed)
Faxed script back to [facility]...[name]

## 2013-04-12 ENCOUNTER — Telehealth: Payer: Self-pay | Admitting: Internal Medicine

## 2013-04-12 ENCOUNTER — Other Ambulatory Visit: Payer: Self-pay | Admitting: Internal Medicine

## 2013-04-12 NOTE — Telephone Encounter (Signed)
Thanks

## 2013-04-12 NOTE — Telephone Encounter (Signed)
See in chart received refill from [facility] on yesterday. MD approve and script was faxed back to [facility]...[name]

## 2013-04-12 NOTE — Telephone Encounter (Signed)
[name] notified of rx being refilled.

## 2013-04-12 NOTE — Telephone Encounter (Signed)
Wife called and stated husband needing refill on his tramadol. Inform pt wife we received fax from [facility] yesterday and rx was fax back to [facility]. [facility] MD ok [facility]printing [facility]......[name]

## 2013-04-12 NOTE — Telephone Encounter (Signed)
Patient would like tramadol script.

## 2013-04-20 ENCOUNTER — Other Ambulatory Visit: Payer: Self-pay | Admitting: Internal Medicine

## 2013-04-21 ENCOUNTER — Encounter: Payer: Self-pay | Admitting: Internal Medicine

## 2013-04-21 ENCOUNTER — Ambulatory Visit (INDEPENDENT_AMBULATORY_CARE_PROVIDER_SITE_OTHER): Payer: Commercial Managed Care - HMO | Admitting: Internal Medicine

## 2013-04-21 ENCOUNTER — Other Ambulatory Visit (INDEPENDENT_AMBULATORY_CARE_PROVIDER_SITE_OTHER): Payer: Commercial Managed Care - HMO

## 2013-04-21 VITALS — BP 130/52 | HR 68 | Ht 68.0 in | Wt 180.4 lb

## 2013-04-21 DIAGNOSIS — R194 Change in bowel habit: Secondary | ICD-10-CM

## 2013-04-21 DIAGNOSIS — K5900 Constipation, unspecified: Secondary | ICD-10-CM

## 2013-04-21 DIAGNOSIS — R198 Other specified symptoms and signs involving the digestive system and abdomen: Secondary | ICD-10-CM

## 2013-04-21 LAB — CBC WITH DIFFERENTIAL/PLATELET
Basophils Absolute: 0 10*3/uL (ref 0.0–0.1)
Basophils Relative: 0.8 % (ref 0.0–3.0)
EOS PCT: 2.8 % (ref 0.0–5.0)
Eosinophils Absolute: 0.2 10*3/uL (ref 0.0–0.7)
HEMATOCRIT: 37.7 % — AB (ref 39.0–52.0)
Hemoglobin: 12.8 g/dL — ABNORMAL LOW (ref 13.0–17.0)
Lymphocytes Relative: 16.4 % (ref 12.0–46.0)
Lymphs Abs: 1 10*3/uL (ref 0.7–4.0)
MCHC: 33.9 g/dL (ref 30.0–36.0)
MCV: 91.3 fl (ref 78.0–100.0)
Monocytes Absolute: 0.5 10*3/uL (ref 0.1–1.0)
Monocytes Relative: 7.7 % (ref 3.0–12.0)
Neutro Abs: 4.5 10*3/uL (ref 1.4–7.7)
Neutrophils Relative %: 72.3 % (ref 43.0–77.0)
PLATELETS: 272 10*3/uL (ref 150.0–400.0)
RBC: 4.13 Mil/uL — ABNORMAL LOW (ref 4.22–5.81)
RDW: 13.7 % (ref 11.5–14.6)
WBC: 6.3 10*3/uL (ref 4.5–10.5)

## 2013-04-21 LAB — IBC PANEL
IRON: 115 ug/dL (ref 42–165)
Saturation Ratios: 50.9 % — ABNORMAL HIGH (ref 20.0–50.0)
Transferrin: 161.3 mg/dL — ABNORMAL LOW (ref 212.0–360.0)

## 2013-04-21 NOTE — Patient Instructions (Addendum)
You have been given a separate informational sheet regarding your tobacco use, the importance of quitting and local resources to help you quit.  Take 1 bottle of magnesium citrate today and 1 tomorrow. (Over the counter)  Starting on Sunday, use either Miralax 1/2 capful (9 grams) every other day OR OTC magnesium oxide 2 tablets daily OR milk of magnesia, 2 tablespoons every night.  Purchase Benefiber over the counter and take 1 tablespoon daily  Your physician has requested that you go to the basement for the following lab work before leaving today: CBC, IBC  You have been scheduled for an abdominal ultrasound at [facility] (1st floor of hospital) on Monday, 04/24/13 at 8:30 am. Please arrive 15 minutes prior to your appointment for registration. Make certain not to have anything to eat or drink 6 hours prior to your appointment. Should you need to reschedule your appointment, please contact radiology at [phone]. This test typically takes about 30 minutes to perform.  CC:Dr [employer]

## 2013-04-21 NOTE — Progress Notes (Signed)
[name] [facility] 07-Apr-1929 [id number]  Note: This dictation was prepared with Dragon digital system. Any transcriptional errors that result from this procedure are unintentional.   History of Present Illness:  This is an 80 year old white male who is here to evaluate  a changing  bowel habits. Last appointment in April 2011 for the same reason , found to be constipated and started on Miralax  9 gm qod.. A colonoscopy  In April 2011, a tubular adenoma was removed from in the descending colon. He did not continue on  Miralax  On regular basis.. A prior colonoscopy in 2003 showed mild diverticulosis. He has a history of chronic gastritis on an upper endoscopy in 2001, 2003 and 2011 and had a mild esophageal stricture which was dilated in 2003. There is a history of prostate cancer treated with radiation.[name] Kitchen He also has a history of alcohol abuse until 2002. He has a hx of right thalamic CVA. requiring long term  aspirin and Plavix. He underwent  bilateral carotid endarterectomy. He comes today complaining of constipation and overflow diarrhea as well as incontinence. He takes MiraLax only once a week and according to his wife does not eat any fiber. He has been on iron supplements. His last hemoglobin was 12.2 with a hematocrit of 34.9. His last upper abdominal ultrasound in 2001 showed a dilated common bile duct.     Past Medical History  Diagnosis Date  . HTN (hypertension)     Negative renal duplex 07-22-11  . 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 Esophageal stricture     Distal, benign  . Diverticulosis   . GERD (gastroesophageal reflux disease)   . Hemorrhoids   . Allergic rhinitis   . Prostate cancer 2000    Seed XRT  . Dyslipidemia   . Chronic pain syndrome     Left shoulder, back  . Peripheral vascular disease     Left CEA 2002  . Asthma   . Anemia     Past Surgical History  Procedure Laterality Date  . Back surgery  8/07    cervical spine  . Back surgery       lumbar  . Appendectomy    . Cataract extraction      bilateral  . Insertion prostate radiation seed  2000  . Carotid endarterectomy  2002    L cea  . Nasal sinus surgery      Allergies  Allergen Reactions  . Codeine   . Diovan [Valsartan]     hyperkalemia  . Hctz [Hydrochlorothiazide]     Swelling and dyspnea   . Oxycodone-Acetaminophen     Family history and social history have been reviewed.  Review of Systems: Dysphagia to pills. Constipation and lower abdominal discomfort  The remainder of the 10 point ROS is negative except as outlined in the H&P  Physical Exam: General Appearance Well developed, in no distress Eyes  Non icteric  HEENT  Non traumatic, normocephalic  Mouth No lesion, tongue papillated, no cheilosis Neck Supple without adenopathy, thyroid not enlarged, s/p bilat CEA, no JVD Lungs Clear to auscultation bilaterally COR Normal S1, normal S2, regular rhythm, no murmur, quiet precordium Abdomen mildly protuberant soft nontender small umbilical hernia Rectal large amount of dark impacted stool which is Hemoccult negative Extremities  No pedal edema Skin No lesions Neurological Alert and oriented x 3 Psychological Normal mood and affect  Assessment and Plan:   Problem #1 Constipation resulting in fecal impaction and  overflow diarrhea. Supplemental iron as well as inactivity and lack of dietary fiber may be contributing to  constipation. He will take magnesium citrate 12 ounces today and repeat it tomorrow. He will then start MiraLax 9 g every other day or magnesium oxide 2 tablets daily or milk of magnesia 2 tablespoons every night. I also would like him to increase his fiber intake and have asked him to purchase Benefiber and take one teaspoon daily.  Problem #2 History of adenomatous polyp. He will be due for a colonoscopy in April 2016.  Problem #3 Dilated common bile duct. Patient has history of alcohol use. We will obtain an upper abdominal  ultrasound.  Problem #4 Gastroesophageal reflux with history of stricture. He takes omeprazole on an as necessary basis.  Problem #5 History of anemia. Patient is on iron supplements. We will check hemoglobin and iron studies today and if normal, we will discontinue iron supplements which may be contributing to his constipation.     [place] Dragon 04/21/2013

## 2013-04-24 ENCOUNTER — Ambulatory Visit (HOSPITAL_COMMUNITY)
Admission: RE | Admit: 2013-04-24 | Discharge: 2013-04-24 | Disposition: A | Discharge status: Home / Self Care | Payer: Medicare PPO | Source: Ambulatory Visit | Attending: Internal Medicine | Admitting: Internal Medicine

## 2013-04-24 DIAGNOSIS — R109 Unspecified abdominal pain: Secondary | ICD-10-CM | POA: Diagnosis present

## 2013-04-24 DIAGNOSIS — K7689 Other specified diseases of liver: Secondary | ICD-10-CM | POA: Insufficient documentation

## 2013-04-24 DIAGNOSIS — R194 Change in bowel habit: Secondary | ICD-10-CM

## 2013-04-26 ENCOUNTER — Telehealth: Payer: Self-pay | Admitting: *Deleted

## 2013-04-26 NOTE — Telephone Encounter (Signed)
Left msg on vm stating received toprolol in the mail from [facility] and husband doesn't take this medication. Called pt wife back reminded her back in march the insurance denied covering the Aldomet so Dr. Asa Lente change it to toprolol until he was able to get to see Dr. Harrington Challenger since he ws the one who rx medication. Wife states they had appt with dr. Harrington Challenger and he was able to get med approve so he is still taking the aldomet. Wanted to make sure md new & toprolol was removed from his med list.../lmb

## 2013-04-26 NOTE — Telephone Encounter (Signed)
Noted - thanks for the update

## 2013-05-09 ENCOUNTER — Other Ambulatory Visit: Payer: Self-pay | Admitting: *Deleted

## 2013-05-09 MED ORDER — CLONIDINE HCL 0.1 MG PO TABS: ORAL_TABLET | ORAL | Status: DC

## 2013-05-09 MED ORDER — CLONIDINE HCL 0.3 MG PO TABS: ORAL_TABLET | ORAL | Status: DC

## 2013-05-14 NOTE — Progress Notes (Signed)
HPI Patient is a 80 year old with a history of HTN (difficult to control in past) He also has a history of reactive airway disease He was last in clinic in December 2014 SInce seen he remains active  No CP  Does use rescue inhaler Brings in bp log  BP high, mainly at night  938 to 101 systolic.  In 140s at other times.   Allergies  Allergen Reactions  . Codeine   . Diovan [Valsartan]     hyperkalemia  . Hctz [Hydrochlorothiazide]     Swelling and dyspnea   . Oxycodone-Acetaminophen     Current Outpatient Prescriptions  Medication Sig Dispense Refill  . albuterol (PROVENTIL HFA;VENTOLIN HFA) 108 (90 BASE) MCG/ACT inhaler Inhale 2 puffs into the lungs every 6 (six) hours as needed for wheezing.  3 Inhaler  0  . amLODipine (NORVASC) 5 MG tablet Take 7.5 mg by mouth daily.      [name] Kitchen atorvastatin (LIPITOR) 40 MG tablet TAKE 1 TABLET (40 MG TOTAL) BY MOUTH DAILY.  90 tablet  3  . budesonide-formoterol (SYMBICORT) 160-4.5 MCG/ACT inhaler Inhale 2 puffs into the lungs 2 (two) times daily.  3 Inhaler  0  . cloNIDine (CATAPRES) 0.1 MG tablet TAKE 1 TABLET (0.1 MG TOTAL) BY MOUTH DAILY AT 10 PM.  90 tablet  1  . cloNIDine (CATAPRES) 0.3 MG tablet TAKE 1 TABLET BY MOUTH TWICE A DAY  180 tablet  1  . clopidogrel (PLAVIX) 75 MG tablet Take 1 tablet (75 mg total) by mouth daily.  90 tablet  2  . docusate sodium (COLACE) 100 MG capsule Take 100 mg by mouth 2 (two) times daily.      . Ferrous Sulfate Dried (FERROUS SULFATE CR PO) Take by mouth 2 (two) times daily.      [name] Kitchen gabapentin (NEURONTIN) 300 MG capsule TAKE ONE CAPSULE 3 TIMES A DAY  90 capsule  3  . methyldopa (ALDOMET) 500 MG tablet 2 tabs in am, 1 tab mid-day, 2 tab in pm  450 tablet  1  . montelukast (SINGULAIR) 10 MG tablet TAKE 1 TABLET EVERY DAY AT BEDTIME  90 tablet  0  . omeprazole (PRILOSEC) 40 MG capsule Take 1 capsule (40 mg total) by mouth daily.  90 capsule  3  . polyethylene glycol (MIRALAX / GLYCOLAX) packet Take 17 g by mouth as  needed.       . senna-docusate (SENOKOT-S) 8.6-50 MG per tablet Take 1 tablet by mouth as needed.        . traMADol (ULTRAM) 50 MG tablet TAKE 1-2 TABLETS EVERY 8 HOURS AS NEEDED (MAX OF 6 IN 24 HOURS)  180 tablet  0  . triamcinolone cream (KENALOG) 0.5 % Apply topically as needed.      . [DISCONTINUED] valsartan (DIOVAN) 160 MG tablet Take 160 mg by mouth daily.         No current facility-administered medications for this visit.    Past Medical History  Diagnosis Date  . HTN (hypertension)     Negative renal duplex 07-22-11  . 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 Esophageal stricture     Distal, benign  . Diverticulosis   . GERD (gastroesophageal reflux disease)   . Hemorrhoids   . Allergic rhinitis   . Prostate cancer 2000    Seed XRT  . Dyslipidemia   . Chronic pain syndrome     Left shoulder, back  . Peripheral vascular disease  Left CEA 2002  . Asthma   . Anemia     Past Surgical History  Procedure Laterality Date  . Cervical disc surgery  8/07  . Lumbar disc surgery    . Appendectomy    . Cataract extraction Bilateral   . Insertion prostate radiation seed  2000  . Carotid endarterectomy  2002    L cea  . Nasal sinus surgery      x 3  . Carotid endarterectomy Right 01/2010    Family History  Problem Relation Age of Onset  . Lung cancer Brother   . Stroke Sister     History   Social History  . Marital Status: Married    Spouse Name: N/A    Number of Children: 1  . Years of Education: N/A   Occupational History  . retired   .     Social History Main Topics  . Smoking status: Former Smoker -- 0.25 packs/day for 35 years    Types: Cigarettes    Quit date: 01/12/1978  . Smokeless tobacco: Current User    Types: Chew  . Alcohol Use: No     Comment: Prior alcoholic, sober since 8719  . Drug Use: No  . Sexual Activity: Not Currently   Other Topics Concern  . Not on file   Social History Narrative  . No narrative on file     Review of Systems:  All systems reviewed.  They are negative to the above problem except as previously stated.  Vital Signs: BP 114/50  Pulse 68  Ht 5\' 8"  (1.727 m)  Wt 178 lb 6.4 oz (80.922 kg)  BMI 27.13 kg/m2  SpO2 98%  Physical Exam Patient is in NAD HEENT:  Normocephalic, atraumatic. EOMI, PERRLA.  Neck: JVP is normal.  No bruits.  Lungs: mild wheezing.    Moving air. Heart: Regular rate and rhythm. Normal S1, S2. No S3.   No significant murmurs. PMI not displaced.  Abdomen:  Supple, nontender. Normal bowel sounds. No masses. No hepatomegaly.  Extremities:   Good distal pulses throughout. No lower extremity edema.  Musculoskeletal :moving all extremities.  Neuro:   alert and oriented x3.  CN II-XII grossly intact.   Assessment and Plan:  1.  HTN  BP is lablile  GOod now.  I would recomm taking amlodipine 5 bid  He is taking 10 at once at home.  Continue other meds  Wife says he does miss methyldopa at home   2.  HL  Keep on meds  Excellent crontrol in 2014.   Ate this AM  3.  CVA  Keep on meds   .4.  PUlm  SOme wheezing  Encouraged him to take inhalers, esp symbicort.

## 2013-05-15 ENCOUNTER — Encounter: Payer: Self-pay | Admitting: Internal Medicine

## 2013-05-15 ENCOUNTER — Ambulatory Visit (INDEPENDENT_AMBULATORY_CARE_PROVIDER_SITE_OTHER): Payer: Medicare PPO | Admitting: Internal Medicine

## 2013-05-15 VITALS — BP 114/50 | HR 68 | Ht 68.0 in | Wt 178.4 lb

## 2013-05-15 DIAGNOSIS — E785 Hyperlipidemia, unspecified: Secondary | ICD-10-CM

## 2013-05-15 DIAGNOSIS — J45909 Unspecified asthma, uncomplicated: Secondary | ICD-10-CM

## 2013-05-15 DIAGNOSIS — I10 Essential (primary) hypertension: Secondary | ICD-10-CM

## 2013-05-15 MED ORDER — METHYLDOPA 500 MG PO TABS: ORAL_TABLET | ORAL | Status: DC

## 2013-05-15 MED ORDER — CLOPIDOGREL BISULFATE 75 MG PO TABS: 75.0000 mg | ORAL_TABLET | Freq: Every day | ORAL | Status: DC

## 2013-05-15 MED ORDER — ATORVASTATIN CALCIUM 40 MG PO TABS: ORAL_TABLET | ORAL | Status: DC

## 2013-05-15 MED ORDER — CLONIDINE HCL 0.3 MG PO TABS: ORAL_TABLET | ORAL | Status: DC

## 2013-05-15 MED ORDER — AMLODIPINE BESYLATE 5 MG PO TABS: 5.0000 mg | ORAL_TABLET | Freq: Two times a day (BID) | ORAL | Status: DC

## 2013-05-15 MED ORDER — CLONIDINE HCL 0.1 MG PO TABS: ORAL_TABLET | ORAL | Status: DC

## 2013-05-15 NOTE — Patient Instructions (Signed)
Your physician wants you to follow-up in: [place] will receive a reminder letter in the mail two months in advance. If you don't receive a letter, please call our office to schedule the follow-up appointment.   CHANGE AMLODIPINE TO 5 MG TWICE DAILY

## 2013-05-28 ENCOUNTER — Encounter (HOSPITAL_BASED_OUTPATIENT_CLINIC_OR_DEPARTMENT_OTHER): Payer: Self-pay | Admitting: Emergency Medicine

## 2013-05-28 ENCOUNTER — Emergency Department (HOSPITAL_BASED_OUTPATIENT_CLINIC_OR_DEPARTMENT_OTHER)
Admission: EM | Admit: 2013-05-28 | Discharge: 2013-05-28 | Disposition: A | Discharge status: Home / Self Care | Payer: No Typology Code available for payment source | Attending: Emergency Medicine | Admitting: Emergency Medicine

## 2013-05-28 ENCOUNTER — Emergency Department (HOSPITAL_BASED_OUTPATIENT_CLINIC_OR_DEPARTMENT_OTHER): Payer: No Typology Code available for payment source

## 2013-05-28 DIAGNOSIS — I10 Essential (primary) hypertension: Secondary | ICD-10-CM | POA: Insufficient documentation

## 2013-05-28 DIAGNOSIS — E785 Hyperlipidemia, unspecified: Secondary | ICD-10-CM | POA: Diagnosis not present

## 2013-05-28 DIAGNOSIS — J45909 Unspecified asthma, uncomplicated: Secondary | ICD-10-CM | POA: Insufficient documentation

## 2013-05-28 DIAGNOSIS — Z87891 Personal history of nicotine dependence: Secondary | ICD-10-CM | POA: Insufficient documentation

## 2013-05-28 DIAGNOSIS — S4980XA Other specified injuries of shoulder and upper arm, unspecified arm, initial encounter: Secondary | ICD-10-CM | POA: Diagnosis present

## 2013-05-28 DIAGNOSIS — S43402A Unspecified sprain of left shoulder joint, initial encounter: Secondary | ICD-10-CM

## 2013-05-28 DIAGNOSIS — G894 Chronic pain syndrome: Secondary | ICD-10-CM | POA: Insufficient documentation

## 2013-05-28 DIAGNOSIS — Z8673 Personal history of transient ischemic attack (TIA), and cerebral infarction without residual deficits: Secondary | ICD-10-CM | POA: Diagnosis not present

## 2013-05-28 DIAGNOSIS — Z8546 Personal history of malignant neoplasm of prostate: Secondary | ICD-10-CM | POA: Diagnosis not present

## 2013-05-28 DIAGNOSIS — D649 Anemia, unspecified: Secondary | ICD-10-CM | POA: Insufficient documentation

## 2013-05-28 DIAGNOSIS — IMO0002 Reserved for concepts with insufficient information to code with codable children: Secondary | ICD-10-CM | POA: Insufficient documentation

## 2013-05-28 DIAGNOSIS — Y92410 Unspecified street and highway as the place of occurrence of the external cause: Secondary | ICD-10-CM | POA: Diagnosis not present

## 2013-05-28 DIAGNOSIS — Z79899 Other long term (current) drug therapy: Secondary | ICD-10-CM | POA: Insufficient documentation

## 2013-05-28 DIAGNOSIS — Z7901 Long term (current) use of anticoagulants: Secondary | ICD-10-CM | POA: Insufficient documentation

## 2013-05-28 DIAGNOSIS — S40019A Contusion of unspecified shoulder, initial encounter: Secondary | ICD-10-CM | POA: Insufficient documentation

## 2013-05-28 DIAGNOSIS — Y9389 Activity, other specified: Secondary | ICD-10-CM | POA: Insufficient documentation

## 2013-05-28 DIAGNOSIS — K219 Gastro-esophageal reflux disease without esophagitis: Secondary | ICD-10-CM | POA: Diagnosis not present

## 2013-05-28 DIAGNOSIS — S46909A Unspecified injury of unspecified muscle, fascia and tendon at shoulder and upper arm level, unspecified arm, initial encounter: Secondary | ICD-10-CM | POA: Diagnosis present
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
## 2013-06-06 ENCOUNTER — Other Ambulatory Visit: Payer: Self-pay | Admitting: *Deleted

## 2013-06-06 MED ORDER — GABAPENTIN 300 MG PO CAPS: ORAL_CAPSULE | ORAL | Status: DC

## 2013-06-08 ENCOUNTER — Telehealth: Payer: Self-pay | Admitting: Internal Medicine

## 2013-06-08 MED ORDER — GABAPENTIN 300 MG PO CAPS: ORAL_CAPSULE | ORAL | Status: DC

## 2013-06-08 NOTE — Telephone Encounter (Signed)
Pt needs the gabapentin sent to [facility] on [address] for 30 day supply.  The other order went to [facility] and should have gone to [facility].  They made an appt to see Dr. Asa Lente on July 1.

## 2013-06-09 ENCOUNTER — Telehealth: Payer: Self-pay | Admitting: Internal Medicine

## 2013-06-09 DIAGNOSIS — L989 Disorder of the skin and subcutaneous tissue, unspecified: Secondary | ICD-10-CM

## 2013-06-09 NOTE — Telephone Encounter (Signed)
Pt needs a referral to Dr. Elvera Lennox at [facility] dermatology for places on his head that need to be removed.  [employer] insurance

## 2013-06-09 NOTE — Telephone Encounter (Signed)
Pt needs the referral below, but also needs the gabapentin that was sent to [facility] on May 28.   [name] states the insurance is blocking them getting it.  See the phone note from May 28.  [name] called very upset because the refill went to the wrong mail order.  He is out of the medicine.

## 2013-06-12 MED ORDER — GABAPENTIN 300 MG PO CAPS: ORAL_CAPSULE | ORAL | Status: DC

## 2013-06-12 NOTE — Telephone Encounter (Signed)
Refer done [facility] rx corrected - local and mail order

## 2013-06-12 NOTE — Addendum Note (Signed)
Addended by: Gwendolyn Grant A on: 06/12/2013 08:08 AM   Modules accepted: Orders

## 2013-06-26 ENCOUNTER — Other Ambulatory Visit: Payer: Self-pay | Admitting: *Deleted

## 2013-06-26 MED ORDER — TRAMADOL HCL 50 MG PO TABS: ORAL_TABLET | ORAL | Status: DC

## 2013-06-26 NOTE — Telephone Encounter (Signed)
Faxed script back to [facility]...[name]

## 2013-06-29 ENCOUNTER — Telehealth: Payer: Self-pay | Admitting: Internal Medicine

## 2013-06-29 NOTE — Telephone Encounter (Signed)
Spoke with pt wife, Aware of dr Harrington Challenger recommendation

## 2013-06-29 NOTE — Telephone Encounter (Signed)
BP has been up and down  I was a little better recently He takes prn clonidine I would not push meds further given that his much lower at [facility].

## 2013-06-29 NOTE — Telephone Encounter (Signed)
Spoke with pt wife, on the 16th his bp after medication in the pm was 220/70, she gave him an extra 0.1 mg catapres and his bp came down to 198/60. On the 17th a noon bp= 120/50 and at 11pm 190/60 prior to meds and then 162/56 post meds. She reports he has been under a lot of stress, he has been having back pain and his umbilical hernia has started hurting and he has not had a BM in the last two days. He has an appt with his medical doctor tomorrow. He is taking the catapres 0.3 mg when he gets up some where between 8am and 12noon and also 5 mg of norvasc. Then is taking the catapres 0.3 mg and 0.1 mg between 10p and 14mn with the second 5 mg norvasc. Explained I felt like all the recent problems he is having is probably causing the increased bp. She will cont to give the extra 0.1 mg of catapres after meds in the evening if elevated. Will forward for dr Harrington Challenger review

## 2013-06-29 NOTE — Telephone Encounter (Signed)
New problem   Pt is having problems with  High blood pressure and need to speak to nurse. Please call pt's wife

## 2013-06-30 ENCOUNTER — Other Ambulatory Visit (INDEPENDENT_AMBULATORY_CARE_PROVIDER_SITE_OTHER): Payer: Commercial Managed Care - HMO

## 2013-06-30 ENCOUNTER — Ambulatory Visit (INDEPENDENT_AMBULATORY_CARE_PROVIDER_SITE_OTHER): Payer: Commercial Managed Care - HMO | Admitting: Internal Medicine

## 2013-06-30 ENCOUNTER — Encounter: Payer: Self-pay | Admitting: Internal Medicine

## 2013-06-30 VITALS — BP 120/58 | HR 68 | Temp 98.3°F | Wt 175.0 lb

## 2013-06-30 DIAGNOSIS — R239 Unspecified skin changes: Secondary | ICD-10-CM

## 2013-06-30 DIAGNOSIS — R238 Other skin changes: Secondary | ICD-10-CM

## 2013-06-30 DIAGNOSIS — E785 Hyperlipidemia, unspecified: Secondary | ICD-10-CM

## 2013-06-30 DIAGNOSIS — K429 Umbilical hernia without obstruction or gangrene: Secondary | ICD-10-CM

## 2013-06-30 DIAGNOSIS — I10 Essential (primary) hypertension: Secondary | ICD-10-CM

## 2013-06-30 LAB — LIPID PANEL
CHOL/HDL RATIO: 3
Cholesterol: 91 mg/dL (ref 0–200)
HDL: 32.4 mg/dL — ABNORMAL LOW (ref >39.00)
LDL Cholesterol: 39 mg/dL (ref 0–99)
NONHDL: 58.6
Triglycerides: 98 mg/dL (ref 0.0–149.0)
VLDL: 19.6 mg/dL (ref 0.0–40.0)

## 2013-06-30 MED ORDER — TRAMADOL HCL 50 MG PO TABS: ORAL_TABLET | ORAL | Status: DC

## 2013-06-30 NOTE — Assessment & Plan Note (Signed)
Prev on simvastatin, changed to atorva Check annually - titrate as needed history of stroke in 2001 and left CEA in 2002

## 2013-06-30 NOTE — Assessment & Plan Note (Signed)
Reports increase distention with physical activity currently reduced on exam, +BS Education and reassurance provided about diagnosis Red flags for which to seek emergency care reviewed with patient and wife today Will give rx for abdominal binder as pt poor candidate for surgical repair

## 2013-06-30 NOTE — Assessment & Plan Note (Signed)
BP Readings from Last 3 Encounters:  06/30/13 120/58  05/28/13 114/88  05/15/13 114/50  changed amlodipine to 5mg  bid  The current medical regimen is effective;  continue present plan and medications.

## 2013-06-30 NOTE — Progress Notes (Signed)
Pre visit review using our clinic review tool, if applicable. No additional management support is needed unless otherwise documented below in the visit note. 

## 2013-06-30 NOTE — Patient Instructions (Signed)
It was good to see you today.  We have reviewed your prior records including labs and tests today  Test(s) ordered today. Your results will be released to [place] (or called to you) after review, usually within 72hours after test completion. If any changes need to be made, you will be notified at that same time.  Medications reviewed and updated, no changes recommended at this time. Refill on medication(s) as discussed today.  Please schedule followup in 6-12 months, call sooner if problems.

## 2013-06-30 NOTE — Progress Notes (Signed)
Subjective:    Patient ID: [name], male    DOB: 1929/01/31, 80 y.o.   MRN: [id number]  HPI  Patient here for follow up Reviewed chronic medical issues and interval medical events  Past Medical History  Diagnosis Date  . HTN (hypertension)     Negative renal duplex 07-22-11  . 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 Esophageal stricture     Distal, benign  . Diverticulosis   . GERD (gastroesophageal reflux disease)   . Hemorrhoids   . Allergic rhinitis   . Prostate cancer 2000    Seed XRT  . Dyslipidemia   . Chronic pain syndrome     Left shoulder, back  . Peripheral vascular disease     Left CEA 2002  . Asthma   . Anemia     Review of Systems  Constitutional: Negative for fever and fatigue.  Respiratory: Negative for cough and shortness of breath.   Cardiovascular: Negative for chest pain and leg swelling.  Gastrointestinal: Positive for abdominal pain, constipation and abdominal distention (intermittent with hernia). Negative for diarrhea and rectal pain.       Objective:   Physical Exam  BP 120/58  Pulse 68  Temp(Src) 98.3 F (36.8 C) (Oral)  Wt 175 lb (79.379 kg)  SpO2 98% Wt Readings from Last 3 Encounters:  06/30/13 175 lb (79.379 kg)  05/28/13 178 lb (80.74 kg)  05/15/13 178 lb 6.4 oz (80.922 kg)   Constitutional: he appears well-developed and well-nourished. No distress. wife at side Neck: Normal range of motion. Neck supple. No JVD present. No thyromegaly present.  Cardiovascular: Normal rate, regular rhythm and normal heart sounds.  No murmur heard. No BLE edema. Abdomen: firm midline umbilical hernia - reducible and +BS, but tender to palpation Pulmonary/Chest: Effort normal and breath sounds normal. No respiratory distress. he has no wheezes.  Psychiatric: he has a normal mood and affect. His behavior is normal. Judgment and thought content normal.   Lab Results  Component Value Date   WBC 6.3 04/21/2013   HGB 12.8*  04/21/2013   HCT 37.7* 04/21/2013   PLT 272.0 04/21/2013   GLUCOSE 108* 09/13/2012   CHOL 98 05/04/2012   TRIG 128.0 05/04/2012   HDL 28.70* 05/04/2012   LDLCALC 44 05/04/2012   ALT 12 05/04/2012   AST 17 05/04/2012   NA 135 09/13/2012   K 4.4 09/13/2012   CL 105 09/13/2012   CREATININE 1.1 09/13/2012   BUN 21 09/13/2012   CO2 27 09/13/2012   TSH 3.31 05/04/2012   PSA 0.02* 12/11/2008   INR 0.97 02/06/2010   HGBA1C 6.4 05/04/2012    Dg Shoulder Left  05/28/2013   CLINICAL DATA:  Abrasions to the top of left shoulder.  EXAM: LEFT SHOULDER - 2+ VIEW  COMPARISON:  None.  FINDINGS: There is no evidence of fracture or dislocation. Soft tissues are unremarkable.  IMPRESSION: No acute fracture or dislocation.   Electronically Signed   By: Abelardo Diesel M.D.   On: 05/28/2013 19:04       Assessment & Plan:   Problem List Items Addressed This Visit   HYPERLIPIDEMIA     Prev on simvastatin, changed to atorva Check annually - titrate as needed history of stroke in 2001 and left CEA in 2002    Relevant Orders      Lipid panel   HYPERTENSION, BENIGN ESSENTIAL      BP Readings from Last 3 Encounters:  06/30/13 120/58  05/28/13 114/88  05/15/13 114/50  changed amlodipine to 5mg  bid  The current medical regimen is effective;  continue present plan and medications.    Umbilical hernia - Primary     Reports increase distention with physical activity currently reduced on exam, +BS Education and reassurance provided about diagnosis Red flags for which to seek emergency care reviewed with patient and wife today Will give rx for abdominal binder as pt poor candidate for surgical repair    Relevant Orders      DME Other see comment

## 2013-07-05 ENCOUNTER — Telehealth: Payer: Self-pay | Admitting: *Deleted

## 2013-07-05 NOTE — Telephone Encounter (Signed)
Wife is requesting last labs to be mail to address. Inform pt will send...Johny Chess

## 2013-07-12 ENCOUNTER — Ambulatory Visit: Payer: Medicare PPO | Admitting: Internal Medicine

## 2013-07-21 NOTE — Telephone Encounter (Signed)
Close encounter 

## 2013-08-10 ENCOUNTER — Emergency Department (HOSPITAL_COMMUNITY): Payer: Medicare HMO

## 2013-08-10 ENCOUNTER — Encounter (HOSPITAL_COMMUNITY): Payer: Self-pay | Admitting: Emergency Medicine

## 2013-08-10 ENCOUNTER — Inpatient Hospital Stay (HOSPITAL_COMMUNITY)
Admit: 2013-08-10 | Discharge: 2013-08-10 | Disposition: A | Discharge status: Home / Self Care | Payer: Medicare HMO | Attending: Internal Medicine | Admitting: Internal Medicine

## 2013-08-10 ENCOUNTER — Inpatient Hospital Stay (HOSPITAL_COMMUNITY)
Admission: EM | Admit: 2013-08-10 | Discharge: 2013-08-11 | DRG: 193 | Disposition: A | Discharge status: Home / Self Care | Payer: Medicare HMO | Attending: Internal Medicine | Admitting: Internal Medicine

## 2013-08-10 DIAGNOSIS — Z823 Family history of stroke: Secondary | ICD-10-CM

## 2013-08-10 DIAGNOSIS — R400 Somnolence: Secondary | ICD-10-CM | POA: Diagnosis present

## 2013-08-10 DIAGNOSIS — R404 Transient alteration of awareness: Secondary | ICD-10-CM | POA: Diagnosis not present

## 2013-08-10 DIAGNOSIS — Z8546 Personal history of malignant neoplasm of prostate: Secondary | ICD-10-CM | POA: Diagnosis not present

## 2013-08-10 DIAGNOSIS — I739 Peripheral vascular disease, unspecified: Secondary | ICD-10-CM | POA: Diagnosis present

## 2013-08-10 DIAGNOSIS — D649 Anemia, unspecified: Secondary | ICD-10-CM | POA: Diagnosis present

## 2013-08-10 DIAGNOSIS — G8929 Other chronic pain: Secondary | ICD-10-CM

## 2013-08-10 DIAGNOSIS — J189 Pneumonia, unspecified organism: Secondary | ICD-10-CM

## 2013-08-10 DIAGNOSIS — J45909 Unspecified asthma, uncomplicated: Secondary | ICD-10-CM | POA: Diagnosis present

## 2013-08-10 DIAGNOSIS — G894 Chronic pain syndrome: Secondary | ICD-10-CM | POA: Diagnosis present

## 2013-08-10 DIAGNOSIS — Z79899 Other long term (current) drug therapy: Secondary | ICD-10-CM | POA: Diagnosis not present

## 2013-08-10 DIAGNOSIS — Z801 Family history of malignant neoplasm of trachea, bronchus and lung: Secondary | ICD-10-CM

## 2013-08-10 DIAGNOSIS — I10 Essential (primary) hypertension: Secondary | ICD-10-CM | POA: Diagnosis present

## 2013-08-10 DIAGNOSIS — G9340 Encephalopathy, unspecified: Secondary | ICD-10-CM | POA: Diagnosis present

## 2013-08-10 DIAGNOSIS — Z87891 Personal history of nicotine dependence: Secondary | ICD-10-CM | POA: Diagnosis not present

## 2013-08-10 DIAGNOSIS — I69998 Other sequelae following unspecified cerebrovascular disease: Secondary | ICD-10-CM

## 2013-08-10 DIAGNOSIS — K219 Gastro-esophageal reflux disease without esophagitis: Secondary | ICD-10-CM | POA: Diagnosis present

## 2013-08-10 DIAGNOSIS — G609 Hereditary and idiopathic neuropathy, unspecified: Secondary | ICD-10-CM | POA: Diagnosis present

## 2013-08-10 DIAGNOSIS — I6350 Cerebral infarction due to unspecified occlusion or stenosis of unspecified cerebral artery: Secondary | ICD-10-CM | POA: Diagnosis present

## 2013-08-10 DIAGNOSIS — E785 Hyperlipidemia, unspecified: Secondary | ICD-10-CM | POA: Diagnosis present

## 2013-08-10 LAB — BASIC METABOLIC PANEL
Anion gap: 12 (ref 5–15)
BUN: 30 mg/dL — AB (ref 6–23)
CHLORIDE: 102 meq/L (ref 96–112)
CO2: 22 meq/L (ref 19–32)
Calcium: 8.8 mg/dL (ref 8.4–10.5)
Creatinine, Ser: 0.83 mg/dL (ref 0.50–1.35)
GFR calc Af Amer: >90 mL/min (ref >90)
GFR calc non Af Amer: 80 mL/min — ABNORMAL LOW (ref >90)
GLUCOSE: 140 mg/dL — AB (ref 70–99)
POTASSIUM: 4.3 meq/L (ref 3.7–5.3)
Sodium: 136 mEq/L — ABNORMAL LOW (ref 137–147)

## 2013-08-10 LAB — CBC
HEMATOCRIT: 36.9 % — AB (ref 39.0–52.0)
HEMOGLOBIN: 12.6 g/dL — AB (ref 13.0–17.0)
MCH: 30.3 pg (ref 26.0–34.0)
MCHC: 34.1 g/dL (ref 30.0–36.0)
MCV: 88.7 fL (ref 78.0–100.0)
Platelets: 219 10*3/uL (ref 150–400)
RBC: 4.16 MIL/uL — AB (ref 4.22–5.81)
RDW: 13.2 % (ref 11.5–15.5)
WBC: 17 10*3/uL — ABNORMAL HIGH (ref 4.0–10.5)

## 2013-08-10 LAB — BLOOD GAS, ARTERIAL
ACID-BASE DEFICIT: 1.2 mmol/L (ref 0.0–2.0)
Bicarbonate: 23.8 mEq/L (ref 20.0–24.0)
Drawn by: 31814
FIO2: 0.28 %
O2 SAT: 86.9 %
PATIENT TEMPERATURE: 98.4
TCO2: 21.7 mmol/L (ref 0–100)
pCO2 arterial: 43.2 mmHg (ref 35.0–45.0)
pH, Arterial: 7.36 (ref 7.350–7.450)
pO2, Arterial: 51.9 mmHg — ABNORMAL LOW (ref 80.0–100.0)

## 2013-08-10 LAB — RAPID URINE DRUG SCREEN, HOSP PERFORMED
AMPHETAMINES: NOT DETECTED
Barbiturates: NOT DETECTED
Benzodiazepines: NOT DETECTED
Cocaine: NOT DETECTED
OPIATES: NOT DETECTED
Tetrahydrocannabinol: NOT DETECTED

## 2013-08-10 LAB — URINALYSIS, ROUTINE W REFLEX MICROSCOPIC
BILIRUBIN URINE: NEGATIVE
Glucose, UA: NEGATIVE mg/dL
HGB URINE DIPSTICK: NEGATIVE
Ketones, ur: NEGATIVE mg/dL
Leukocytes, UA: NEGATIVE
Nitrite: NEGATIVE
Protein, ur: NEGATIVE mg/dL
SPECIFIC GRAVITY, URINE: 1.018 (ref 1.005–1.030)
Urobilinogen, UA: 1 mg/dL (ref 0.0–1.0)
pH: 5 (ref 5.0–8.0)

## 2013-08-10 LAB — I-STAT CG4 LACTIC ACID, ED: LACTIC ACID, VENOUS: 1.23 mmol/L (ref 0.5–2.2)

## 2013-08-10 LAB — ETHANOL: Alcohol, Ethyl (B): <11 mg/dL (ref 0–11)

## 2013-08-10 LAB — AMMONIA: Ammonia: 29 umol/L (ref 11–60)

## 2013-08-10 LAB — TSH: TSH: 1.43 u[IU]/mL (ref 0.350–4.500)

## 2013-08-10 LAB — TROPONIN I: Troponin I: <0.3 ng/mL (ref <0.30)

## 2013-08-10 MED ORDER — METHYLDOPA 500 MG PO TABS: 500.0000 mg | ORAL_TABLET | Freq: Two times a day (BID) | ORAL | Status: DC

## 2013-08-10 MED ORDER — DEXTROSE 5 % IV SOLN
1.0000 g | Freq: Once | INTRAVENOUS | Status: AC
  Administered 2013-08-10: 1 g via INTRAVENOUS
  Filled 2013-08-10: qty 10

## 2013-08-10 MED ORDER — BUDESONIDE-FORMOTEROL FUMARATE 160-4.5 MCG/ACT IN AERO
2.0000 | INHALATION_SPRAY | Freq: Two times a day (BID) | RESPIRATORY_TRACT | Status: DC
  Administered 2013-08-10 – 2013-08-11 (×2): 2 via RESPIRATORY_TRACT
  Filled 2013-08-10: qty 6

## 2013-08-10 MED ORDER — DEXTROSE 5 % IV SOLN
1.0000 g | INTRAVENOUS | Status: DC
  Filled 2013-08-10: qty 10

## 2013-08-10 MED ORDER — ALBUTEROL SULFATE HFA 108 (90 BASE) MCG/ACT IN AERS: 2.0000 | INHALATION_SPRAY | Freq: Four times a day (QID) | RESPIRATORY_TRACT | Status: DC | PRN

## 2013-08-10 MED ORDER — CETYLPYRIDINIUM CHLORIDE 0.05 % MT LIQD
7.0000 mL | Freq: Two times a day (BID) | OROMUCOSAL | Status: DC
  Administered 2013-08-10 – 2013-08-11 (×2): 7 mL via OROMUCOSAL

## 2013-08-10 MED ORDER — AMLODIPINE BESYLATE 5 MG PO TABS
5.0000 mg | ORAL_TABLET | Freq: Two times a day (BID) | ORAL | Status: DC
  Administered 2013-08-10 – 2013-08-11 (×3): 5 mg via ORAL
  Filled 2013-08-10 (×4): qty 1

## 2013-08-10 MED ORDER — DEXTROSE 5 % IV SOLN
500.0000 mg | Freq: Once | INTRAVENOUS | Status: AC
  Administered 2013-08-10: 500 mg via INTRAVENOUS
  Filled 2013-08-10: qty 500

## 2013-08-10 MED ORDER — SODIUM CHLORIDE 0.9 % IV SOLN
INTRAVENOUS | Status: DC
  Administered 2013-08-10 (×2): via INTRAVENOUS

## 2013-08-10 MED ORDER — MONTELUKAST SODIUM 10 MG PO TABS
10.0000 mg | ORAL_TABLET | Freq: Every day | ORAL | Status: DC
  Administered 2013-08-10: 10 mg via ORAL
  Filled 2013-08-10 (×2): qty 1

## 2013-08-10 MED ORDER — CLONIDINE HCL 0.1 MG PO TABS
0.1000 mg | ORAL_TABLET | Freq: Two times a day (BID) | ORAL | Status: DC
  Administered 2013-08-10 – 2013-08-11 (×2): 0.1 mg via ORAL
  Filled 2013-08-10 (×3): qty 1

## 2013-08-10 MED ORDER — ENOXAPARIN SODIUM 40 MG/0.4ML ~~LOC~~ SOLN
40.0000 mg | SUBCUTANEOUS | Status: DC
Start: 2013-08-10 — End: 2013-08-11
  Administered 2013-08-10: 40 mg via SUBCUTANEOUS
  Filled 2013-08-10 (×3): qty 0.4

## 2013-08-10 MED ORDER — CLOPIDOGREL BISULFATE 75 MG PO TABS
75.0000 mg | ORAL_TABLET | Freq: Every day | ORAL | Status: DC
  Administered 2013-08-10 – 2013-08-11 (×2): 75 mg via ORAL
  Filled 2013-08-10 (×2): qty 1

## 2013-08-10 MED ORDER — ATORVASTATIN CALCIUM 40 MG PO TABS
40.0000 mg | ORAL_TABLET | Freq: Every day | ORAL | Status: DC
  Administered 2013-08-10: 40 mg via ORAL
  Filled 2013-08-10 (×2): qty 1

## 2013-08-10 MED ORDER — PSYLLIUM 95 % PO PACK
1.0000 | PACK | Freq: Two times a day (BID) | ORAL | Status: DC
  Administered 2013-08-10 (×2): 1 via ORAL
  Filled 2013-08-10 (×5): qty 1

## 2013-08-10 MED ORDER — DEXTROSE 5 % IV SOLN
1.0000 g | INTRAVENOUS | Status: DC
  Administered 2013-08-11: 1 g via INTRAVENOUS
  Filled 2013-08-10: qty 10

## 2013-08-10 MED ORDER — MAGNESIUM OXIDE 400 (241.3 MG) MG PO TABS
400.0000 mg | ORAL_TABLET | Freq: Two times a day (BID) | ORAL | Status: DC
  Administered 2013-08-10 – 2013-08-11 (×3): 400 mg via ORAL
  Filled 2013-08-10 (×5): qty 1

## 2013-08-10 MED ORDER — CLONIDINE HCL 0.1 MG PO TABS: 0.1000 mg | ORAL_TABLET | Freq: Every evening | ORAL | Status: DC

## 2013-08-10 MED ORDER — BUDESONIDE-FORMOTEROL FUMARATE 160-4.5 MCG/ACT IN AERO: 2.0000 | INHALATION_SPRAY | Freq: Two times a day (BID) | RESPIRATORY_TRACT | Status: DC

## 2013-08-10 MED ORDER — HYDRALAZINE HCL 20 MG/ML IJ SOLN
10.0000 mg | Freq: Four times a day (QID) | INTRAMUSCULAR | Status: DC | PRN
  Administered 2013-08-11: 10 mg via INTRAVENOUS
  Filled 2013-08-10: qty 1

## 2013-08-10 MED ORDER — POLYETHYLENE GLYCOL 3350 17 G PO PACK
17.0000 g | PACK | ORAL | Status: DC | PRN
  Filled 2013-08-10: qty 1

## 2013-08-10 MED ORDER — DEXTROSE 5 % IV SOLN
500.0000 mg | INTRAVENOUS | Status: DC
  Administered 2013-08-11: 500 mg via INTRAVENOUS
  Filled 2013-08-10: qty 500

## 2013-08-10 MED ORDER — METHYLDOPA 500 MG PO TABS
500.0000 mg | ORAL_TABLET | Freq: Three times a day (TID) | ORAL | Status: DC
  Filled 2013-08-10 (×3): qty 1

## 2013-08-10 MED ORDER — PANTOPRAZOLE SODIUM 40 MG PO TBEC
40.0000 mg | DELAYED_RELEASE_TABLET | Freq: Every day | ORAL | Status: DC
  Administered 2013-08-10 – 2013-08-11 (×2): 40 mg via ORAL
  Filled 2013-08-10 (×2): qty 1

## 2013-08-10 MED ORDER — ALBUTEROL SULFATE (2.5 MG/3ML) 0.083% IN NEBU: 2.5000 mg | INHALATION_SOLUTION | Freq: Four times a day (QID) | RESPIRATORY_TRACT | Status: DC | PRN

## 2013-08-10 MED ORDER — CLONIDINE HCL 0.3 MG PO TABS
0.3000 mg | ORAL_TABLET | Freq: Two times a day (BID) | ORAL | Status: DC
  Filled 2013-08-10 (×2): qty 1

## 2013-08-10 MED ORDER — GABAPENTIN 300 MG PO CAPS
300.0000 mg | ORAL_CAPSULE | Freq: Two times a day (BID) | ORAL | Status: DC
  Filled 2013-08-10 (×2): qty 1

## 2013-08-10 MED ORDER — VITAMINS A & D EX OINT
TOPICAL_OINTMENT | CUTANEOUS | Status: AC
  Administered 2013-08-10: 21:00:00
  Filled 2013-08-10: qty 5

## 2013-08-10 MED ORDER — ACETAMINOPHEN 325 MG PO TABS
650.0000 mg | ORAL_TABLET | Freq: Four times a day (QID) | ORAL | Status: DC | PRN
  Administered 2013-08-10: 650 mg via ORAL
  Filled 2013-08-10: qty 2

## 2013-08-10 MED ORDER — DEXTROSE 5 % IV SOLN
500.0000 mg | INTRAVENOUS | Status: DC
  Filled 2013-08-10: qty 500

## 2013-08-10 NOTE — ED Provider Notes (Signed)
CSN: [id number]     Arrival date & time 08/10/13  2426 History   First MD Initiated Contact with Patient 08/10/13 0340     Chief Complaint  Patient presents with  . Weakness  . Altered Mental Status     (Consider location/radiation/quality/duration/timing/severity/associated sxs/prior Treatment) HPI 80 year old male presents to emergency department via EMS from home with complaint of lethargy, weakness x1 day and increased unresponsiveness overnight.  EMS gives history that wife reported some fevers yesterday unknown source.  Patient was given Narcan, 4 mg, which seemed to arouse patient briefly, but upon arrival to the emergency department he was again somnolent.  Wife not available for further history.  Patient noted to be wearing shirt shorts and sandals, EMS reports they found him in bed.  Patient rouses to sternal rub and ammonia capsule, but quickly falls back to sleep.  He cannot give significant history other than his name is Ray.  Past medical history her reviewed, history of hypertension, stroke, esophageal stricture, diverticulosis, prostate cancer, chronic pain syndrome.  Plan for EKG, head CT, labs to include lactate and blood cultures.  Call to get further information from wife when she arrives. Past Medical History  Diagnosis Date  . HTN (hypertension)     Negative renal duplex 07-22-11  . 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 Esophageal stricture     Distal, benign  . Diverticulosis   . GERD (gastroesophageal reflux disease)   . Hemorrhoids   . Allergic rhinitis   . Prostate cancer 2000    Seed XRT  . Dyslipidemia   . Chronic pain syndrome     Left shoulder, back  . Peripheral vascular disease     Left CEA 2002  . Asthma   . Anemia    Past Surgical History  Procedure Laterality Date  . Cervical disc surgery  8/07  . Lumbar disc surgery    . Appendectomy    . Cataract extraction Bilateral   . Insertion prostate radiation seed  2000  . Carotid  endarterectomy  2002    L cea  . Nasal sinus surgery      x 3  . Carotid endarterectomy Right 01/2010   Family History  Problem Relation Age of Onset  . Lung cancer Brother   . Stroke Sister    History  Substance Use Topics  . Smoking status: Former Smoker -- 0.25 packs/day for 35 years    Types: Cigarettes    Quit date: 01/12/1978  . Smokeless tobacco: Current User    Types: Chew  . Alcohol Use: No     Comment: Prior alcoholic, sober since 8341    Review of Systems  Unable to perform ROS: Mental status change      Allergies  Codeine; Diovan; Hctz; and Oxycodone-acetaminophen  Home Medications   Prior to Admission medications   Medication Sig Start Date End Date Taking? Authorizing Provider  albuterol (PROVENTIL HFA;VENTOLIN HFA) 108 (90 BASE) MCG/ACT inhaler Inhale 2 puffs into the lungs every 6 (six) hours as needed for wheezing. 10/27/12 10/27/13  Rigoberto Noel, MD  amLODipine (NORVASC) 5 MG tablet Take 1 tablet (5 mg total) by mouth 2 (two) times daily. 05/15/13   Fay Records, MD  atorvastatin (LIPITOR) 40 MG tablet TAKE 1 TABLET (40 MG TOTAL) BY MOUTH DAILY. 05/15/13   Fay Records, MD  budesonide-formoterol [facility]) 160-4.5 MCG/ACT inhaler Inhale 2 puffs into the lungs 2 (two) times daily. 10/27/12  Rigoberto Noel, MD  cloNIDine (CATAPRES) 0.1 MG tablet TAKE 1 TABLET (0.1 MG TOTAL) BY MOUTH DAILY AT 10 PM. 05/15/13   Fay Records, MD  cloNIDine (CATAPRES) 0.3 MG tablet TAKE 1 TABLET BY MOUTH TWICE A DAY 05/15/13   Fay Records, MD  clopidogrel (PLAVIX) 75 MG tablet Take 1 tablet (75 mg total) by mouth daily. 05/15/13   Fay Records, MD  docusate sodium (COLACE) 100 MG capsule Take 100 mg by mouth 2 (two) times daily.    Historical Provider, MD  Ferrous Sulfate Dried (FERROUS SULFATE CR PO) Take by mouth 2 (two) times daily.    Historical Provider, MD  gabapentin (NEURONTIN) 300 MG capsule TAKE ONE CAPSULE 3 TIMES A DAY 06/12/13   Rowe Clack, MD  methyldopa (ALDOMET)  500 MG tablet 2 tabs in am, 1 tab mid-day, 2 tab in pm 05/15/13   Fay Records, MD  montelukast (SINGULAIR) 10 MG tablet TAKE 1 TABLET EVERY DAY AT BEDTIME 02/16/13   Rigoberto Noel, MD  omeprazole (PRILOSEC) 40 MG capsule Take 1 capsule (40 mg total) by mouth daily. 03/07/12   Rowe Clack, MD  polyethylene glycol (MIRALAX / GLYCOLAX) packet Take 17 g by mouth as needed.     Historical Provider, MD  senna-docusate (SENOKOT-S) 8.6-50 MG per tablet Take 1 tablet by mouth as needed.      Historical Provider, MD  traMADol (ULTRAM) 50 MG tablet TAKE 1-2 TABLETS EVERY 8 HOURS AS NEEDED (MAX OF 6 IN 24 HOURS) 06/30/13   Rowe Clack, MD  triamcinolone cream (KENALOG) 0.5 % Apply topically as needed. 01/22/12   Janith Lima, MD   BP 152/55  Pulse 61  Temp(Src) 98 F (36.7 C) (Axillary)  Resp 16  SpO2 100% Physical Exam  Nursing note and vitals reviewed. Constitutional: He appears well-developed and well-nourished. No distress.  HENT:  Head: Normocephalic and atraumatic.  Right Ear: External ear normal.  Left Ear: External ear normal.  Nose: Nose normal.  Mouth/Throat: Oropharynx is clear and moist.  Eyes: Conjunctivae and EOM are normal. Pupils are equal, round, and reactive to light.  Neck: Normal range of motion. Neck supple. No JVD present. No tracheal deviation present. No thyromegaly present.  Cardiovascular: Normal rate, regular rhythm, normal heart sounds and intact distal pulses.  Exam reveals no gallop and no friction rub.   No murmur heard. Pulmonary/Chest: Effort normal and breath sounds normal. No stridor. No respiratory distress. He has no wheezes. He has no rales. He exhibits no tenderness.  Abdominal: Soft. Bowel sounds are normal. He exhibits no distension and no mass. There is no tenderness. There is no rebound and no guarding.  Musculoskeletal: Normal range of motion. He exhibits no edema and no tenderness.  Lymphadenopathy:    He has no cervical adenopathy.   Neurological: He exhibits normal muscle tone. Coordination normal.  Patient will rouses briefly to painful stimulus or ammonia capsule.  He will answer one question and then fall back to sleep.  With pain stimulus he moves all extremities.  He cannot stay awake long enough for full neuro evaluation  Skin: Skin is warm and dry. No rash noted. No erythema. No pallor.  Psychiatric: He has a normal mood and affect. His behavior is normal. Judgment and thought content normal.    ED Course  Procedures (including critical care time) Labs Review Labs Reviewed  CBC - Abnormal; Notable for the following:    WBC 17.0 (*)  RBC 4.16 (*)    Hemoglobin 12.6 (*)    HCT 36.9 (*)    All other components within normal limits  BASIC METABOLIC PANEL - Abnormal; Notable for the following:    Sodium 136 (*)    Glucose, Bld 140 (*)    BUN 30 (*)    GFR calc non Af Amer 80 (*)    All other components within normal limits  CULTURE, BLOOD (ROUTINE X 2)  CULTURE, BLOOD (ROUTINE X 2)  URINE CULTURE  URINALYSIS, ROUTINE W REFLEX MICROSCOPIC  TROPONIN I  URINE RAPID DRUG SCREEN (HOSP PERFORMED)  ETHANOL  BLOOD GAS, ARTERIAL  I-STAT CG4 LACTIC ACID, ED    Imaging Review Ct Head Wo Contrast  08/10/2013   CLINICAL DATA:  Altered mental status  EXAM: CT HEAD WITHOUT CONTRAST  TECHNIQUE: Contiguous axial images were obtained from the base of the skull through the vertex without intravenous contrast.  COMPARISON:  Prior MRI 05/09/2012  FINDINGS: There is no acute intracranial hemorrhage or infarct. Remote right thalamic infarct noted, unchanged. Encephalomalacia within the right occipital lobe is also stable, also consistent with remote infarct. Patchy white matter disease involving the supratentorial white matter is grossly stable.  No mass lesion or midline shift. Gray-white matter differentiation is well maintained. Ventricles are normal in size without evidence of hydrocephalus. CSF containing spaces are  within normal limits. No extra-axial fluid collection.  The calvarium is intact.  Orbital soft tissues are within normal limits.  Chronic bilateral maxillary sinus disease noted. No mastoid effusion.  Scalp soft tissues are unremarkable.  IMPRESSION: 1. No acute intracranial process identified. 2. Remote right thalamic and right occipital infarcts, stable from prior MRI. 3. Chronic bilateral maxillary sinus disease.   Electronically Signed   By: Jeannine Boga M.D.   On: 08/10/2013 05:14   Dg Chest Port 1 View  08/10/2013   CLINICAL DATA:  Altered mental status.  Fever.  EXAM: PORTABLE CHEST - 1 VIEW  COMPARISON:  02/06/2010  FINDINGS: Shallow inspiration. Mild cardiac enlargement without vascular congestion. There is a perihilar airspace infiltration on the left suggesting pneumonia. No blunting of costophrenic angles. No pneumothorax. Surgical clips in the base of the neck.  IMPRESSION: Perihilar infiltration on the left suggesting pneumonia.   Electronically Signed   By: Lucienne Capers M.D.   On: 08/10/2013 05:15     EKG Interpretation   Date/Time:  Thursday August 10 2013 03:40:18 EDT Ventricular Rate:  64 PR Interval:  180 QRS Duration: 100 QT Interval:  410 QTC Calculation: 423 R Axis:   -54 Text Interpretation:  Sinus rhythm Left anterior fascicular block Anterior  infarct, old Confirmed by Mcclellan Demarais  MD, Kingsley Herandez ([id number]) on 08/10/2013 4:10:42 AM      MDM   Final diagnoses:  CAP (community acquired pneumonia)  Somnolence    80 year old male with altered mental status, reported fever and lethargy over the last day.  Infection is high on the differential, history of stroke in the past, this also could be a potential cause of his somnolence.  Reading through the notes, there is concern in the past for overuse of pain medications.  We'll get drug screen.    Kalman Drape, MD 08/10/13 [id number]

## 2013-08-10 NOTE — ED Notes (Signed)
Report given to [name], RN on [address]  All questions answered by this nurse

## 2013-08-10 NOTE — H&P (Signed)
[facility] History and Physical  Patient: [name]  BHA:[id number]  DOB: 11-13-1929  DOS: the patient was seen and examined on 08/10/2013 PCP: Gwendolyn Grant, MD  Chief Complaint: Chills, increased sleepiness  HPI: [name] is a 80 y.o. male with Past medical history of hypertension, CVA, esophageal stricture, GERD, moderate pulmonary obstruction. Patient presented with complaints of increased sleepiness. History was obtained from patient's wife as the patient is drowsy and lethargic. As Patient was at his baseline on to this afternoon. When he came from work he went to sleep, it is difficult for him to wake up, he started having fever with chills temperature ranging 101. He also had a cough no expectoration and had some trouble breathing. There was no choking episode. No recent travel no recent sick contact. No recent change in his medications. No fall no trauma no injury.  The patient is coming from home. And at his baseline independent for most of his ADL.  Review of Systems: as mentioned in the history of present illness.  A Comprehensive review of the other systems is negative.  Past Medical History  Diagnosis Date  . HTN (hypertension)     Negative renal duplex 07-22-11  . 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 Esophageal stricture     Distal, benign  . Diverticulosis   . GERD (gastroesophageal reflux disease)   . Hemorrhoids   . Allergic rhinitis   . Prostate cancer 2000    Seed XRT  . Dyslipidemia   . Chronic pain syndrome     Left shoulder, back  . Peripheral vascular disease     Left CEA 2002  . Asthma   . Anemia    Past Surgical History  Procedure Laterality Date  . Cervical disc surgery  8/07  . Lumbar disc surgery    . Appendectomy    . Cataract extraction Bilateral   . Insertion prostate radiation seed  2000  . Carotid endarterectomy  2002    L cea  . Nasal sinus surgery      x 3  . Carotid endarterectomy Right 01/2010    Social History:  reports that he quit smoking about 35 years ago. His smoking use included Cigarettes. He has a 8.75 pack-year smoking history. His smokeless tobacco use includes Chew. He reports that he does not drink alcohol or use illicit drugs.  Allergies  Allergen Reactions  . Codeine   . Diovan [Valsartan]     hyperkalemia  . Hctz [Hydrochlorothiazide]     Swelling and dyspnea   . Oxycodone-Acetaminophen     Family History  Problem Relation Age of Onset  . Lung cancer Brother   . Stroke Sister     Prior to Admission medications   Medication Sig Start Date End Date Taking? Authorizing Provider  albuterol (PROVENTIL HFA;VENTOLIN HFA) 108 (90 BASE) MCG/ACT inhaler Inhale 2 puffs into the lungs every 6 (six) hours as needed for wheezing. 10/27/12 10/27/13  Rigoberto Noel, MD  amLODipine (NORVASC) 5 MG tablet Take 1 tablet (5 mg total) by mouth 2 (two) times daily. 05/15/13   Fay Records, MD  atorvastatin (LIPITOR) 40 MG tablet TAKE 1 TABLET (40 MG TOTAL) BY MOUTH DAILY. 05/15/13   Fay Records, MD  budesonide-formoterol [facility]) 160-4.5 MCG/ACT inhaler Inhale 2 puffs into the lungs 2 (two) times daily. 10/27/12   Rigoberto Noel, MD  cloNIDine (CATAPRES) 0.1 MG tablet TAKE 1 TABLET (0.1 MG TOTAL)  BY MOUTH DAILY AT 10 PM. 05/15/13   Fay Records, MD  cloNIDine (CATAPRES) 0.3 MG tablet TAKE 1 TABLET BY MOUTH TWICE A DAY 05/15/13   Fay Records, MD  clopidogrel (PLAVIX) 75 MG tablet Take 1 tablet (75 mg total) by mouth daily. 05/15/13   Fay Records, MD  docusate sodium (COLACE) 100 MG capsule Take 100 mg by mouth 2 (two) times daily.    Historical Provider, MD  Ferrous Sulfate Dried (FERROUS SULFATE CR PO) Take by mouth 2 (two) times daily.    Historical Provider, MD  gabapentin (NEURONTIN) 300 MG capsule TAKE ONE CAPSULE 3 TIMES A DAY 06/12/13   Rowe Clack, MD  methyldopa (ALDOMET) 500 MG tablet 2 tabs in am, 1 tab mid-day, 2 tab in pm 05/15/13   Fay Records, MD  montelukast  (SINGULAIR) 10 MG tablet TAKE 1 TABLET EVERY DAY AT BEDTIME 02/16/13   Rigoberto Noel, MD  omeprazole (PRILOSEC) 40 MG capsule Take 1 capsule (40 mg total) by mouth daily. 03/07/12   Rowe Clack, MD  polyethylene glycol (MIRALAX / GLYCOLAX) packet Take 17 g by mouth as needed.     Historical Provider, MD  senna-docusate (SENOKOT-S) 8.6-50 MG per tablet Take 1 tablet by mouth as needed.      Historical Provider, MD  traMADol (ULTRAM) 50 MG tablet TAKE 1-2 TABLETS EVERY 8 HOURS AS NEEDED (MAX OF 6 IN 24 HOURS) 06/30/13   Rowe Clack, MD  triamcinolone cream (KENALOG) 0.5 % Apply topically as needed. 01/22/12   Janith Lima, MD    Physical Exam: Filed Vitals:   08/10/13 0545 08/10/13 0600 08/10/13 0615 08/10/13 0619  BP: 142/66 158/56 129/55 153/85  Pulse: 55 56 56 57  Temp:      TempSrc:      Resp: 14 15 17 18   SpO2: 99% 100% 96% 99%    General: Drowsy and lethargic, arousable with sternal rub only, appear in mild distress Eyes: PERRL ENT: Oral Mucosa clear moist. Neck: No JVD Cardiovascular: S1 and S2 Present, no Murmur, Peripheral Pulses Present Respiratory: Bilateral Air entry equal and Decreased, Clear to Auscultation, no Crackles, no wheezes Abdomen: Bowel Sound Present, Soft and Non tender Skin: No Rash Extremities: Bilateral Pedal edema, no calf tenderness Neurologic: Grossly no focal neuro deficit, spontaneously moving all extremities, arousable with sternal rub, reflexes present, gag reflex present  Labs on Admission:  CBC:  Recent Labs Lab 08/10/13 0346  WBC 17.0*  HGB 12.6*  HCT 36.9*  MCV 88.7  PLT 219    CMP     Component Value Date/Time   NA 136* 08/10/2013 0346   K 4.3 08/10/2013 0346   CL 102 08/10/2013 0346   CO2 22 08/10/2013 0346   GLUCOSE 140* 08/10/2013 0346   BUN 30* 08/10/2013 0346   CREATININE 0.83 08/10/2013 0346   CREATININE 1.60* 09/02/2012 1706   CALCIUM 8.8 08/10/2013 0346   PROT 6.9 05/04/2012 1549   ALBUMIN 3.9 05/04/2012 1549   AST  17 05/04/2012 1549   ALT 12 05/04/2012 1549   ALKPHOS 67 05/04/2012 1549   BILITOT 0.6 05/04/2012 1549   GFRNONAA 80* 08/10/2013 0346   GFRAA >90 08/10/2013 0346    No results found for this basename: LIPASE, AMYLASE,  in the last 168 hours No results found for this basename: AMMONIA,  in the last 168 hours   Recent Labs Lab 08/10/13 0346  TROPONINI <0.30   BNP (last 3 results)  No results found for this basename: PROBNP,  in the last 8760 hours  Radiological Exams on Admission: Ct Head Wo Contrast  08/10/2013   CLINICAL DATA:  Altered mental status  EXAM: CT HEAD WITHOUT CONTRAST  TECHNIQUE: Contiguous axial images were obtained from the base of the skull through the vertex without intravenous contrast.  COMPARISON:  Prior MRI 05/09/2012  FINDINGS: There is no acute intracranial hemorrhage or infarct. Remote right thalamic infarct noted, unchanged. Encephalomalacia within the right occipital lobe is also stable, also consistent with remote infarct. Patchy white matter disease involving the supratentorial white matter is grossly stable.  No mass lesion or midline shift. Gray-white matter differentiation is well maintained. Ventricles are normal in size without evidence of hydrocephalus. CSF containing spaces are within normal limits. No extra-axial fluid collection.  The calvarium is intact.  Orbital soft tissues are within normal limits.  Chronic bilateral maxillary sinus disease noted. No mastoid effusion.  Scalp soft tissues are unremarkable.  IMPRESSION: 1. No acute intracranial process identified. 2. Remote right thalamic and right occipital infarcts, stable from prior MRI. 3. Chronic bilateral maxillary sinus disease.   Electronically Signed   By: Jeannine Boga M.D.   On: 08/10/2013 05:14   Dg Chest Port 1 View  08/10/2013   CLINICAL DATA:  Altered mental status.  Fever.  EXAM: PORTABLE CHEST - 1 VIEW  COMPARISON:  02/06/2010  FINDINGS: Shallow inspiration. Mild cardiac enlargement  without vascular congestion. There is a perihilar airspace infiltration on the left suggesting pneumonia. No blunting of costophrenic angles. No pneumothorax. Surgical clips in the base of the neck.  IMPRESSION: Perihilar infiltration on the left suggesting pneumonia.   Electronically Signed   By: Lucienne Capers M.D.   On: 08/10/2013 05:15    EKG: Independently reviewed. normal sinus rhythm, nonspecific ST and T waves changes. Assessment/Plan Principal Problem:   CAP (community acquired pneumonia) Active Problems:   HYPERTENSION, BENIGN ESSENTIAL   CVA   Somnolence   1. CAP (community acquired pneumonia) Patient is presenting with complaints of increased sleepiness with fever cough and chills. Chest x-ray is suggestive of left-sided pneumonia. He also has leukocytosis. No fever here in the hospital. With this patient will be admitted to telemetry. We will treat him with IV fluids, IV ceftriaxone and azithromycin. Will be kept n.p.o. at present he is fully awake. Follow blood cultures urine antigens sputum culture.  2. Increased sleepiness Patient does not appear to have any focal deficit. A CT scan is negative. Possible etiology include fever and infection with sepsis. Also patient is on centrally acting her antihypertensive medication, clonidine and methyldopa along with tramadol for his back pain which can potentially making more drowsy and sleepy. Mental status can also be present. At present we will keep him n.p.o. Serial neuro checks every 4 hours. EEG. If there is no improvement or further worsening he may require a neurologic consultation. ABG does not show any hypercarbia.  3. History of hypertension. Blood pressure at present stable. At present continuing his home medications.  DVT Prophylaxis: subcutaneous Heparin Nutrition: N.p.o.  Code Status: Full  Family Communication: Discussed with wife on the phone, opportunity was given to ask question and all questions were  answered satisfactorily at the time of interview. Disposition: Admitted to inpatient in telemetry unit.  Author: Berle Mull, MD [facility] Pager: [phone] 08/10/2013, 6:35 AM    If 7PM-7AM, please contact night-coverage [email or web address] Password [email or web address]  **Disclaimer: This note may have been dictated with voice recognition software.  Similar sounding words can inadvertently be transcribed and this note may contain transcription errors which may not have been corrected upon publication of note.**

## 2013-08-10 NOTE — Progress Notes (Signed)
PT Cancellation Note  Patient Details Name: BRANCH PACITTI MRN: [id number] DOB: 1929-07-28   Cancelled Treatment:    Reason Eval/Treat Not Completed: Patient at procedure or test/unavailable (EEG)   Meliss Fleek,KATHrine E 08/10/2013, 2:06 PM Carmelia Bake, PT, DPT 08/10/2013 Pager: [phone]

## 2013-08-10 NOTE — Progress Notes (Addendum)
[facility] PROGRESS NOTE  [name] KZS:[id number] DOB: 1929/10/21 DOA: 08/10/2013 PCP: Gwendolyn Grant, MD  Assessment/Plan: Community-acquired pneumonia Continue with empiric Rocephin and azithromycin. Follow blood culture, urine for strep antigen and Legionella. Continue when necessary nebs. Supportive care with old and gentle hydration.  Acute encephalopathy -Possibly in the setting of underlying infection. Head CT unremarkable. UA normal, U. tox negative.. Wife reports patient was cleaning the floor with strong smelling chemicals 1 day prior to admission.  -Patient is on tramadol, Neurontin, clonidine and Methyl dopa which are centrally acting and can cause him to be drowsy and sleepy. Continue with neuro checks. He does have some tremors on exam. ABG unremarkable. He is arousable this morning and is oriented to place and person. He however is drowsy. I will hold his methyl dopa and reduced dose of clonidine to 0.1 mg twice daily. I would hold his tramadol and Neurontin as well . I would monitor him during the day and if do not improve I will obtain an MRI of the brain and EEG. -Check ammonia and TSH. - PT valve.  History of asthma Continue Symbicort. On when necessary nebs.  History of CVA with residual left-sided weakness in 2001 Continue Plavix, Lipitor  Peripheral neuropathy Hold Neurontin  Hypertension Blood pressure currently stable. Will reduce dose of clonidine and hold methyl dopa. Continue with amlodipine and out of place him on when necessary hydralazine      Code Status: Full code Family Communication: Wife and daughter at bedside Disposition Plan: Home once improved   Consultants:  none  Procedures:  none  Antibiotics:  IV rocephin and azithromycin  HPI/Subjective: Admission H&P reviewed. Patient continues to be sleepy but is arousable and answering to questions.  Objective: Filed Vitals:   08/10/13 0821  BP: 116/42  Pulse:   Temp:    Resp:    No intake or output data in the 24 hours ending 08/10/13 0940 Filed Weights   08/10/13 0654  Weight: 79.2 kg (174 lb 9.7 oz)    Exam:   General: Elderly male lying in bed sleepy but arousable  HEENT: No pallor, moist oral mucosa  Chest: Clear to auscultation bilaterally, no added sounds  CVS: Normal S1 and S2, no murmurs  Abdomen: Soft, nontender, nondistended, bowel sounds present  Extremities: Warm, no edema  CNS: Drowsy but arousable, chronic mild left-sided weakness, flapping tremors   Data Reviewed: Basic Metabolic Panel:  Recent Labs Lab 08/10/13 0346  NA 136*  K 4.3  CL 102  CO2 22  GLUCOSE 140*  BUN 30*  CREATININE 0.83  CALCIUM 8.8   Liver Function Tests: No results found for this basename: AST, ALT, ALKPHOS, BILITOT, PROT, ALBUMIN,  in the last 168 hours No results found for this basename: LIPASE, AMYLASE,  in the last 168 hours No results found for this basename: AMMONIA,  in the last 168 hours CBC:  Recent Labs Lab 08/10/13 0346  WBC 17.0*  HGB 12.6*  HCT 36.9*  MCV 88.7  PLT 219   Cardiac Enzymes:  Recent Labs Lab 08/10/13 0346  TROPONINI <0.30   BNP (last 3 results) No results found for this basename: PROBNP,  in the last 8760 hours CBG: No results found for this basename: GLUCAP,  in the last 168 hours  No results found for this or any previous visit (from the past 240 hour(s)).   Studies: Ct Head Wo Contrast  08/10/2013   CLINICAL DATA:  Altered mental status  EXAM: CT HEAD  WITHOUT CONTRAST  TECHNIQUE: Contiguous axial images were obtained from the base of the skull through the vertex without intravenous contrast.  COMPARISON:  Prior MRI 05/09/2012  FINDINGS: There is no acute intracranial hemorrhage or infarct. Remote right thalamic infarct noted, unchanged. Encephalomalacia within the right occipital lobe is also stable, also consistent with remote infarct. Patchy white matter disease involving the supratentorial  white matter is grossly stable.  No mass lesion or midline shift. Gray-white matter differentiation is well maintained. Ventricles are normal in size without evidence of hydrocephalus. CSF containing spaces are within normal limits. No extra-axial fluid collection.  The calvarium is intact.  Orbital soft tissues are within normal limits.  Chronic bilateral maxillary sinus disease noted. No mastoid effusion.  Scalp soft tissues are unremarkable.  IMPRESSION: 1. No acute intracranial process identified. 2. Remote right thalamic and right occipital infarcts, stable from prior MRI. 3. Chronic bilateral maxillary sinus disease.   Electronically Signed   By: Jeannine Boga M.D.   On: 08/10/2013 05:14   Dg Chest Port 1 View  08/10/2013   CLINICAL DATA:  Altered mental status.  Fever.  EXAM: PORTABLE CHEST - 1 VIEW  COMPARISON:  02/06/2010  FINDINGS: Shallow inspiration. Mild cardiac enlargement without vascular congestion. There is a perihilar airspace infiltration on the left suggesting pneumonia. No blunting of costophrenic angles. No pneumothorax. Surgical clips in the base of the neck.  IMPRESSION: Perihilar infiltration on the left suggesting pneumonia.   Electronically Signed   By: Lucienne Capers M.D.   On: 08/10/2013 05:15    Scheduled Meds: . amLODipine  5 mg Oral BID  . antiseptic oral rinse  7 mL Mouth Rinse BID  . atorvastatin  40 mg Oral q1800  . [START ON 08/11/2013] azithromycin  500 mg Intravenous Q24H  . budesonide-formoterol  2 puff Inhalation BID  . [START ON 08/11/2013] cefTRIAXone (ROCEPHIN)  IV  1 g Intravenous Q24H  . cloNIDine  0.3 mg Oral BID  . clopidogrel  75 mg Oral Daily  . enoxaparin (LOVENOX) injection  40 mg Subcutaneous Q24H  . gabapentin  300 mg Oral BID  . methyldopa  500 mg Oral TID  . methyldopa  500 mg Oral BID  . montelukast  10 mg Oral QHS  . pantoprazole  40 mg Oral Daily   Continuous Infusions: . sodium chloride 75 mL/hr at 08/10/13 0814      Time  spent: 25 minutes    Louellen Molder  [facility] Pager [phone] If 7PM-7AM, please contact night-coverage at [email or web address], password [facility] 08/10/2013, 9:40 AM  LOS: 0 days

## 2013-08-10 NOTE — ED Notes (Signed)
Bed: PZ96 Expected date:  Expected time:  Means of arrival:  Comments: EMS dec LOC, better after Narcan

## 2013-08-10 NOTE — ED Notes (Signed)
Portable CXR at bedside.

## 2013-08-10 NOTE — ED Notes (Signed)
Patient with AMS Patient will respond to ammonia inhalant briefly Patient oriented to name only at this time Dr. Sharol Given at bedside and is aware Labs drawn and sent

## 2013-08-10 NOTE — ED Notes (Signed)
Patient back from radiology Neuro/mental status remain the same

## 2013-08-10 NOTE — Progress Notes (Signed)
UR completed 

## 2013-08-10 NOTE — ED Notes (Signed)
RT aware of order for ABG

## 2013-08-10 NOTE — Care Management Note (Addendum)
    Page 1 of 1   08/11/2013     12:57:31 PM CARE MANAGEMENT NOTE 08/11/2013  Patient:  [name], [name]   Account Number:  [id number]  Date Initiated:  08/10/2013  Documentation initiated by:  Dessa Phi  Subjective/Objective Assessment:   80 Y/O M ADMITTED W/PNA.AMS.     Action/Plan:   FROM HOME W/SPOUSE.   Anticipated DC Date:  08/11/2013   Anticipated DC Plan:  [place]  CM consult      Choice offered to / List presented to:  C-1 Patient        [place] arranged  [place] PT      [place].   Status of service:  Completed, signed off Medicare Important Message given?  YES (If response is "NO", the following Medicare IM given date fields will be blank) Date Medicare IM given:  08/11/2013 Medicare IM given by:  [facility] Date Additional Medicare IM given:   Additional Medicare IM given by:    Discharge Disposition:  [place]  Per UR Regulation:  Reviewed for med. necessity/level of care/duration of stay  If discussed at [place] of Stay Meetings, dates discussed:    Comments:  08/11/13 Tyric Rodeheaver RN,BSN NCM [place].PATIENT ALREADY HAS RW @ HOME,CONFIRMED BY PATIENT'S DTR IN RM.[facility] CHOSEN FOR HHC.TC KRISTEN AWARE OF ORDER, & D/C. NO FURTHER D/C NEEDS OR ORDERS.  08/10/13 Ysela Hettinger RN,BSN NCM [phone] AWAIT PT RECOMMENDATIONS.

## 2013-08-10 NOTE — Procedures (Signed)
ELECTROENCEPHALOGRAM REPORT   Patient: [name]       Room #: 0354 EEG No. ID: [id number] Age: 80 y.o.        Sex: male Referring Physician: Dhungel Report Date:  08/10/2013        Interpreting Physician: Alexis Goodell D  History: [name] is an 80 y.o. male with unresponsiveness  Medications:  Scheduled: . amLODipine  5 mg Oral BID  . antiseptic oral rinse  7 mL Mouth Rinse BID  . atorvastatin  40 mg Oral q1800  . [START ON 08/11/2013] azithromycin  500 mg Intravenous Q24H  . budesonide-formoterol  2 puff Inhalation BID  . [START ON 08/11/2013] cefTRIAXone (ROCEPHIN)  IV  1 g Intravenous Q24H  . cloNIDine  0.1 mg Oral BID  . clopidogrel  75 mg Oral Daily  . enoxaparin (LOVENOX) injection  40 mg Subcutaneous Q24H  . magnesium oxide  400 mg Oral BID  . montelukast  10 mg Oral QHS  . pantoprazole  40 mg Oral Daily  . psyllium  1 packet Oral BID    Conditions of Recording:  This is a 16 channel EEG carried out with the patient in the drowsy and asleep states.  Description:  The patient odes not achieve wakefulness during the recording for a waking background activity to be evaluated.   The patient drowses for the majority of the recording with slowing seen diffusely that is irregular and low voltage.  It consists of a poorly organized mixture of theta and delta activity.     The patient goes in to a light sleep briefly with symmetrical sleep spindles, vertex central sharp transients and irregular slow activity. Hyperventilation and intermittent photic stimulation were not performed.   IMPRESSION: This is a normal drowsy and asleep electroencephalogram.  No epileptiform activity is noted.     Alexis Goodell, MD [facility] [phone] 08/10/2013, 7:10 PM

## 2013-08-10 NOTE — ED Notes (Signed)
Dr. Sharol Given spoke with patient's wife over the phone--updates given

## 2013-08-10 NOTE — ED Notes (Signed)
Patient arrives via EMS due to c/o lethargy and weakness Patient's wife with c/o patient becoming increasingly weak and lethargic over the past day Patient went to bed and wife states that he was unresponsive Patient given Narcan 4 mg IV by EMS and patient sat up briefly, but then became lethargic again

## 2013-08-10 NOTE — ED Notes (Signed)
Hospitalist at bedside 

## 2013-08-10 NOTE — Progress Notes (Signed)
EEG Completed; Results Pending  

## 2013-08-10 NOTE — ED Notes (Signed)
Patient transported to CT 

## 2013-08-11 DIAGNOSIS — G9340 Encephalopathy, unspecified: Secondary | ICD-10-CM

## 2013-08-11 LAB — LEGIONELLA ANTIGEN, URINE: LEGIONELLA ANTIGEN, URINE: NEGATIVE

## 2013-08-11 LAB — URINE CULTURE
Colony Count: NO GROWTH
Culture: NO GROWTH

## 2013-08-11 LAB — CBC
HEMATOCRIT: 32.4 % — AB (ref 39.0–52.0)
Hemoglobin: 11.4 g/dL — ABNORMAL LOW (ref 13.0–17.0)
MCH: 31.6 pg (ref 26.0–34.0)
MCHC: 35.2 g/dL (ref 30.0–36.0)
MCV: 89.8 fL (ref 78.0–100.0)
Platelets: 206 10*3/uL (ref 150–400)
RBC: 3.61 MIL/uL — ABNORMAL LOW (ref 4.22–5.81)
RDW: 13.3 % (ref 11.5–15.5)
WBC: 17.9 10*3/uL — ABNORMAL HIGH (ref 4.0–10.5)

## 2013-08-11 LAB — BASIC METABOLIC PANEL
Anion gap: 11 (ref 5–15)
BUN: 20 mg/dL (ref 6–23)
CO2: 23 mEq/L (ref 19–32)
CREATININE: 0.8 mg/dL (ref 0.50–1.35)
Calcium: 8.9 mg/dL (ref 8.4–10.5)
Chloride: 106 mEq/L (ref 96–112)
GFR calc non Af Amer: 82 mL/min — ABNORMAL LOW (ref >90)
GLUCOSE: 151 mg/dL — AB (ref 70–99)
Potassium: 4.3 mEq/L (ref 3.7–5.3)
Sodium: 140 mEq/L (ref 137–147)

## 2013-08-11 LAB — STREP PNEUMONIAE URINARY ANTIGEN: STREP PNEUMO URINARY ANTIGEN: NEGATIVE

## 2013-08-11 MED ORDER — LEVOFLOXACIN 750 MG PO TABS
750.0000 mg | ORAL_TABLET | Freq: Every day | ORAL | Status: DC
  Administered 2013-08-11: 750 mg via ORAL
  Filled 2013-08-11: qty 1

## 2013-08-11 MED ORDER — LEVOFLOXACIN 750 MG PO TABS: 750.0000 mg | ORAL_TABLET | Freq: Every day | ORAL | Status: DC

## 2013-08-11 MED ORDER — CLONIDINE HCL 0.2 MG PO TABS: 0.2000 mg | ORAL_TABLET | Freq: Two times a day (BID) | ORAL | Status: DC

## 2013-08-11 NOTE — Discharge Summary (Signed)
Physician Discharge Summary  EVART KI UYQ:[id number] DOB: Feb 12, 1929 DOA: 08/10/2013  PCP: Rene Paci, MD  Admit date: 08/10/2013 Discharge date: 08/11/2013  Time spent: 35 minutes  Recommendations for Outpatient Follow-up:  1. Discharged home with home health PT. Patient will be discharged on oral Levaquin for 3 more days (completes on 08/14/2013). Please follow final blood cultures and urine for Legionella antigen result. 2. Follow up with PCP in one week and have blood pressure monitored. Tramadol has been discontinued with concern for somnolence . Also dose of clonidine has been reduced .  Discharge Diagnoses:  Principal Problem:   CAP (community acquired pneumonia)  Active Problems:   Acute encephalopathy   ANEMIA   HYPERTENSION, BENIGN ESSENTIAL    history of CVA   Somnolence   Discharge Condition:  fair   Diet recommendation: cardiac   Filed Weights   08/10/13 0654  Weight: 79.2 kg (174 lb 9.7 oz)    History of present illness:   please refer to admission H&P for details, but in brief, 80 year old male with history of hypertension, CVA with residual mild left-sided weakness, esophageal stricture, GERD was brought to the ED by patient's wife with complaint of increased sleepiness since the day of admission. Patient came back from work and went off to sleep and was difficult to be arousable and was found to have fever and chills with temperature of 101F. He also had some nonproductive cough and appeared short of breath. Denies any recent sick contact or travel. No change in medications recently. No history of fall or trauma. As per wife patient was cleaning a house with strong chemical one day prior to admission   Course in the ED Patient was afebrile. Blood pressure was stable. Chest x-ray was suggestive of a left-sided pneumonia and had significant leukocytosis. Patient admitted to telemetry with neuro checks and started on empiric IV Rocephin and azithromycin.  Blood culture and urine antigens sent.  Hospital Course:  Community-acquired pneumonia  Continue with empiric Rocephin and azithromycin.  blood culture negative to date, urine for strep antigen negative. Urine for Legionella pending Continue when necessary nebs. Supportive care with old and gentle hydration.    Acute encephalopathy  -Possibly in the setting of underlying infection. Head CT unremarkable. UA normal, U. tox negative.. Wife reports patient was cleaning the floor with strong smelling chemicals 1 day prior to admission.  -Patient is on tramadol, Neurontin, clonidine and Methyl dopa which are all centrally acting and can cause him to be drowsy and sleepy.  He did have some tremors on exam on the morning of admission. ABG unremarkable. Ammonia and TSH levels were normal. EEG done was unremarkable for epileptic activity. His Methyldopa and Neurontin were held. Tramadol was held as well. Clonidine dose was reduced. Patient slowly started to remain awake and is fully oriented this morning. He was seen by physical therapy and recommended home health PT. I will reduce his clonidine dose and hold his tramadol. He is retired apply Neurontin can be resumed and needs to be monitored as outpatient.    History of asthma  Continue Symbicort.   History of CVA with residual left-sided weakness in 2001  Continue Plavix, Lipitor   Peripheral neuropathy  Resume Neurontin  Hypertension  Blood pressure currently stable. Resume methyl dopa. Continue amlodipine. Dose of clonidine reduced  Code Status: Full code  Family Communication: Spoke with wife on the phone  Disposition Plan: Home with home health  Consultants:  None    Procedures:  None   Antibiotics:  IV rocephin and azithromycin (7/30)      Discharge Exam: Filed Vitals:   08/11/13 1038  BP: 155/52  Pulse:   Temp:   Resp:     General:  elderly male in no acute distress HEENT: No pallor, moist oral mucosa Chest:  Clear to auscultation bilaterally CVS: Normal S1-S2, no murmurs rub or gallop Abdomen: Soft, nontender, nondistended, bowel sounds present Extremities: Warm: No edema CNS: Alert and oriented    Discharge Instructions You were cared for by a hospitalist during your hospital stay. If you have any questions about your discharge medications or the care you received while you were in the hospital after you are discharged, you can call the unit and asked to speak with the hospitalist on call if the hospitalist that took care of you is not available. Once you are discharged, your primary care physician will handle any further medical issues. Please note that NO REFILLS for any discharge medications will be authorized once you are discharged, as it is imperative that you return to your primary care physician (or establish a relationship with a primary care physician if you do not have one) for your aftercare needs so that they can reassess your need for medications and monitor your lab values.     Medication List    STOP taking these medications       traMADol 50 MG tablet  Commonly known as:  ULTRAM      TAKE these medications       albuterol 108 (90 BASE) MCG/ACT inhaler  Commonly known as:  PROVENTIL HFA;VENTOLIN HFA  Inhale 2 puffs into the lungs every 6 (six) hours as needed for wheezing.     amLODipine 5 MG tablet  Commonly known as:  NORVASC  Take 1 tablet (5 mg total) by mouth 2 (two) times daily.     atorvastatin 40 MG tablet  Commonly known as:  LIPITOR  Take 40 mg by mouth every morning.     BENEFIBER PO  Take 1 scoop by mouth 2 (two) times daily. Powder- Mix with liquid     budesonide-formoterol 160-4.5 MCG/ACT inhaler  Commonly known as:  SYMBICORT  Inhale 2 puffs into the lungs 2 (two) times daily.     cetirizine 10 MG tablet  Commonly known as:  ZYRTEC  Take 10 mg by mouth daily.     cloNIDine 0.1 MG tablet  Commonly known as:  CATAPRES  Take 0.1 mg by mouth  daily at 10 pm.     cloNIDine 0.2 MG tablet  Commonly known as:  CATAPRES  Take 1 tablet (0.2 mg total) by mouth 2 (two) times daily.     clopidogrel 75 MG tablet  Commonly known as:  PLAVIX  Take 1 tablet (75 mg total) by mouth daily.     docusate sodium 100 MG capsule  Commonly known as:  COLACE  Take 100 mg by mouth 2 (two) times daily.     gabapentin 300 MG capsule  Commonly known as:  NEURONTIN  Take 300 mg by mouth 3 (three) times daily.     levofloxacin 750 MG tablet  Commonly known as:  LEVAQUIN  Take 1 tablet (750 mg total) by mouth daily.  Start taking on:  08/12/2013     magnesium oxide 400 MG tablet  Commonly known as:  MAG-OX  Take 400 mg by mouth 2 (two) times daily.     methyldopa 500 MG tablet  Commonly known as:  ALDOMET  Take 500-1,000 mg by mouth 3 (three) times daily. 1000 mg in the am 500 mg at midday and 1000 mg at bedtime     montelukast 10 MG tablet  Commonly known as:  SINGULAIR  Take 10 mg by mouth at bedtime.     omeprazole 40 MG capsule  Commonly known as:  PRILOSEC  Take 1 capsule (40 mg total) by mouth daily.     polyethylene glycol packet  Commonly known as:  MIRALAX / GLYCOLAX  Take 17 g by mouth as needed for mild constipation.     senna-docusate 8.6-50 MG per tablet  Commonly known as:  Senokot-S  Take 1 tablet by mouth as needed for mild constipation.     triamcinolone cream 0.5 %  Commonly known as:  KENALOG  Apply 1 application topically as needed (for skin irritation).       Allergies  Allergen Reactions  . Codeine Nausea Only  . Diovan [Valsartan]     hyperkalemia  . Hctz [Hydrochlorothiazide]     Swelling and dyspnea   . Oxycodone-Acetaminophen Nausea Only       Follow-up Information   Schedule an appointment as soon as possible for a visit with Rene Paci, MD.   Specialty:  Internal Medicine   Contact information:   [address] [place] [address] [phone]         The results of significant diagnostics from this hospitalization (including imaging, microbiology, ancillary and laboratory) are listed below for reference.    Significant Diagnostic Studies: Ct Head Wo Contrast  08/10/2013   CLINICAL DATA:  Altered mental status  EXAM: CT HEAD WITHOUT CONTRAST  TECHNIQUE: Contiguous axial images were obtained from the base of the skull through the vertex without intravenous contrast.  COMPARISON:  Prior MRI 05/09/2012  FINDINGS: There is no acute intracranial hemorrhage or infarct. Remote right thalamic infarct noted, unchanged. Encephalomalacia within the right occipital lobe is also stable, also consistent with remote infarct. Patchy white matter disease involving the supratentorial white matter is grossly stable.  No mass lesion or midline shift. Gray-white matter differentiation is well maintained. Ventricles are normal in size without evidence of hydrocephalus. CSF containing spaces are within normal limits. No extra-axial fluid collection.  The calvarium is intact.  Orbital soft tissues are within normal limits.  Chronic bilateral maxillary sinus disease noted. No mastoid effusion.  Scalp soft tissues are unremarkable.  IMPRESSION: 1. No acute intracranial process identified. 2. Remote right thalamic and right occipital infarcts, stable from prior MRI. 3. Chronic bilateral maxillary sinus disease.   Electronically Signed   By: Rise Mu M.D.   On: 08/10/2013 05:14   Dg Chest Port 1 View  08/10/2013   CLINICAL DATA:  Altered mental status.  Fever.  EXAM: PORTABLE CHEST - 1 VIEW  COMPARISON:  02/06/2010  FINDINGS: Shallow inspiration. Mild cardiac enlargement without vascular congestion. There is a perihilar airspace infiltration on the left suggesting pneumonia. No blunting of costophrenic angles. No pneumothorax. Surgical clips in the base of the neck.  IMPRESSION: Perihilar infiltration on the left suggesting pneumonia.   Electronically Signed   By:  Burman Nieves M.D.   On: 08/10/2013 05:15    Microbiology: Recent Results (from the past 240 hour(s))  CULTURE, BLOOD (ROUTINE X 2)     Status: None   Collection Time    08/10/13  3:47 AM      Result Value Ref Range Status  Specimen Description BLOOD R WRIST   Final   Special Requests BOTTLES DRAWN AEROBIC AND ANAEROBIC 5 ML EACH   Final   Culture  Setup Time     Final   Value: 08/10/2013 08:35     Performed at [facility]   Culture     Final   Value:        BLOOD CULTURE RECEIVED NO GROWTH TO DATE CULTURE WILL BE HELD FOR 5 DAYS BEFORE ISSUING A FINAL NEGATIVE REPORT     Performed at [facility]   Report Status PENDING   Incomplete  CULTURE, BLOOD (ROUTINE X 2)     Status: None   Collection Time    08/10/13  3:58 AM      Result Value Ref Range Status   Specimen Description BLOOD LEFT ANTECUBITAL   Final   Special Requests BOTTLES DRAWN AEROBIC AND ANAEROBIC EACH   Final   Culture  Setup Time     Final   Value: 08/10/2013 08:35     Performed at [facility]   Culture     Final   Value:        BLOOD CULTURE RECEIVED NO GROWTH TO DATE CULTURE WILL BE HELD FOR 5 DAYS BEFORE ISSUING A FINAL NEGATIVE REPORT     Performed at [facility]   Report Status PENDING   Incomplete  URINE CULTURE     Status: None   Collection Time    08/10/13  4:29 AM      Result Value Ref Range Status   Specimen Description URINE, CATHETERIZED   Final   Special Requests NONE   Final   Culture  Setup Time     Final   Value: 08/10/2013 08:37     Performed at [facility]   Colony Count     Final   Value: NO GROWTH     Performed at [facility]   Culture     Final   Value: NO GROWTH     Performed at [facility]   Report Status 08/11/2013 FINAL   Final     Labs: Basic Metabolic Panel:  Recent Labs Lab 08/10/13 0346 08/11/13 0416  NA 136* 140  K 4.3 4.3  CL 102 106  CO2 22 23  GLUCOSE 140* 151*  BUN 30* 20  CREATININE 0.83  0.80  CALCIUM 8.8 8.9   Liver Function Tests: No results found for this basename: AST, ALT, ALKPHOS, BILITOT, PROT, ALBUMIN,  in the last 168 hours No results found for this basename: LIPASE, AMYLASE,  in the last 168 hours  Recent Labs Lab 08/10/13 1020  AMMONIA 29   CBC:  Recent Labs Lab 08/10/13 0346 08/11/13 0416  WBC 17.0* 17.9*  HGB 12.6* 11.4*  HCT 36.9* 32.4*  MCV 88.7 89.8  PLT 219 206   Cardiac Enzymes:  Recent Labs Lab 08/10/13 0346  TROPONINI <0.30   BNP: BNP (last 3 results) No results found for this basename: PROBNP,  in the last 8760 hours CBG: No results found for this basename: GLUCAP,  in the last 168 hours     Signed:  Charlissa Petros  [facility] 08/11/2013, 11:52 AM

## 2013-08-11 NOTE — Evaluation (Signed)
Physical Therapy Evaluation Patient Details Name: [name] MRN: [id number] DOB: 24-Jan-1929 Today's Date: 08/11/2013   History of Present Illness  80 y.o. male with past medical history of hypertension, CVA, esophageal stricture, GERD admitted 08/10/13 with c/o increased sleepiness, found to have CAP.  Clinical Impression  Pt admitted with CAP. Pt currently with functional limitations due to the deficits listed below (see PT Problem List).  Pt will benefit from skilled PT to increase their independence and safety with mobility to allow discharge to the venue listed below.  Pt ambulated in hallway with RW to steady and reports feeling better.  Pt SpO2 97% on room air during ambulation.       Follow Up Recommendations Home health PT    Equipment Recommendations  None recommended by PT    Recommendations for Other Services       Precautions / Restrictions Precautions Precautions: Fall Restrictions Weight Bearing Restrictions: No      Mobility  Bed Mobility Overal bed mobility: Needs Assistance Bed Mobility: Supine to Sit     Supine to sit: Supervision        Transfers Overall transfer level: Needs assistance Equipment used: 1 person hand held assist Transfers: Sit to/from Stand Sit to Stand: Min guard         General transfer comment: HHA for support to steady, no physical assist required  Ambulation/Gait Ambulation/Gait assistance: Min guard Ambulation Distance (Feet): 240 Feet Assistive device: Rolling walker (2 wheeled) Gait Pattern/deviations: Step-through pattern;Trunk flexed;Decreased stride length     General Gait Details: utilized RW due to unsteady in room, improvement in balance with RW observed, pt agreeable to use upon d/c, SpO2 97- 99% room air during ambulation, pt denies SOB  Stairs            Wheelchair Mobility    Modified Rankin (Stroke Patients Only)       Balance                                              Pertinent Vitals/Pain No pain reported    Home Living Family/patient expects to be discharged to:: Private residence Living Arrangements: Spouse/significant other   Type of Home: House       Home Layout: One level Home Equipment: Environmental consultant - 2 wheels;Cane - single point      Prior Function Level of Independence: Independent               Hand Dominance        Extremity/Trunk Assessment               Lower Extremity Assessment: Overall WFL for tasks assessed         Communication   Communication: No difficulties  Cognition Arousal/Alertness: Awake/alert Behavior During Therapy: WFL for tasks assessed/performed Overall Cognitive Status: Within Functional Limits for tasks assessed                      General Comments      Exercises        Assessment/Plan    PT Assessment Patient needs continued PT services  PT Diagnosis Difficulty walking   PT Problem List Decreased strength;Decreased activity tolerance;Decreased balance  PT Treatment Interventions Gait training;Functional mobility training;Therapeutic activities;Therapeutic exercise;Patient/family education;Balance training;DME instruction   PT Goals (Current goals can be found in the Care Plan section) Acute  Rehab PT Goals PT Goal Formulation: With patient Time For Goal Achievement: 08/18/13 Potential to Achieve Goals: Good    Frequency Min 3X/week   Barriers to discharge        Co-evaluation               End of Session Equipment Utilized During Treatment: Gait belt Activity Tolerance: Patient tolerated treatment well Patient left: in bed;with call bell/phone within reach;with bed alarm set Nurse Communication: Mobility status (pt left off O2 Central)         Time: 6010-9323 PT Time Calculation (min): 11 min   Charges:   PT Evaluation $Initial PT Evaluation Tier I: 1 Procedure PT Treatments $Gait Training: 8-22 mins   PT G Codes:          Welborn Keena,KATHrine  E 08/11/2013, 10:37 AM Zenovia Jarred, PT, DPT 08/11/2013 Pager: [phone]

## 2013-08-11 NOTE — Discharge Instructions (Signed)
Pneumonia, Adult  Pneumonia is an infection of the lungs. It may be caused by a germ (virus or bacteria). Some types of pneumonia can spread easily from person to person. This can happen when you cough or sneeze.  HOME CARE   Only take medicine as told by your doctor.   Take your medicine (antibiotics) as told. Finish it even if you start to feel better.   Do not smoke.   You may use a vaporizer or humidifier in your room. This can help loosen thick spit (mucus).   Sleep so you are almost sitting up (semi-upright). This helps reduce coughing.   Rest.  A shot (vaccine) can help prevent pneumonia. Shots are often advised for:   People over 65 years old.   Patients on chemotherapy.   People with long-term (chronic) lung problems.   People with immune system problems.  GET HELP RIGHT AWAY IF:    You are getting worse.   You cannot control your cough, and you are losing sleep.   You cough up blood.   Your pain gets worse, even with medicine.   You have a fever.   Any of your problems are getting worse, not better.   You have shortness of breath or chest pain.  MAKE SURE YOU:    Understand these instructions.   Will watch your condition.   Will get help right away if you are not doing well or get worse.  Document Released: 06/17/2007 Document Revised: 03/23/2011 Document Reviewed: 03/21/2010  ExitCare Patient Information 2015 [employer]. This information is not intended to replace advice given to you by your health care provider. Make sure you discuss any questions you have with your health care provider.

## 2013-08-14 ENCOUNTER — Telehealth: Payer: Self-pay | Admitting: Internal Medicine

## 2013-08-14 NOTE — Telephone Encounter (Signed)
Pt request to be work in for hospital follow up, pt does not want to see another doctor. Please advise, request to be work in this week and request for x-ray to see if the pneumonia is gone.

## 2013-08-14 NOTE — Telephone Encounter (Signed)
I do not have availability - will need to see the NP, Hopp or another doc Sorry -and thanks

## 2013-08-15 ENCOUNTER — Telehealth: Payer: Self-pay | Admitting: *Deleted

## 2013-08-15 NOTE — Telephone Encounter (Signed)
Left msg on triage requesting call back. Called pt wife back no answer LMOM RTC....[name]

## 2013-08-15 NOTE — Telephone Encounter (Signed)
Offer an appt with another doctor, pt stated she will call the hospital and call our office back.

## 2013-08-16 LAB — CULTURE, BLOOD (ROUTINE X 2)
CULTURE: NO GROWTH
Culture: NO GROWTH

## 2013-08-16 NOTE — Telephone Encounter (Signed)
Patients wife called back.  Patient just got out of hospital and meds were changed.  She is requesting Dr. Asa Lente to look at it.  Patient does not want to see another provider for a hospital Fu.  Has not scheduled.  Is requesting a call back at [phone].

## 2013-08-16 NOTE — Telephone Encounter (Signed)
Wife would like PCP to review the med changes that were made.   Pt was discharged from the hospital on Saturday according to wife.  Pt wife stated that they did not want to see any other provider in the office. Nothing open until last September/October.

## 2013-08-16 NOTE — Telephone Encounter (Signed)
So far as i can tell, med changes are:  1) antibiotics - YES, take as directed until gone 2) tramadol - do not use if causes sedation - I agree 3) clonidine - decreased because of low BP in hospital - MY advise: monitor home BP and if becomes elevated, let us know to resume prior dose of clonidine  Please let me know if other specific concerns thanks

## 2013-08-17 NOTE — Telephone Encounter (Signed)
Pt wife called and informed of MD recommendation and advise.

## 2013-08-17 NOTE — Telephone Encounter (Signed)
LVM for pt or wife to call back.

## 2013-08-21 ENCOUNTER — Ambulatory Visit (INDEPENDENT_AMBULATORY_CARE_PROVIDER_SITE_OTHER)
Admission: RE | Admit: 2013-08-21 | Discharge: 2013-08-21 | Disposition: A | Discharge status: Home / Self Care | Payer: Commercial Managed Care - HMO | Source: Ambulatory Visit | Attending: Pulmonary Disease | Admitting: Pulmonary Disease

## 2013-08-21 ENCOUNTER — Ambulatory Visit (INDEPENDENT_AMBULATORY_CARE_PROVIDER_SITE_OTHER): Payer: BC Managed Care – PPO | Admitting: Pulmonary Disease

## 2013-08-21 ENCOUNTER — Encounter: Payer: Self-pay | Admitting: Pulmonary Disease

## 2013-08-21 ENCOUNTER — Other Ambulatory Visit (INDEPENDENT_AMBULATORY_CARE_PROVIDER_SITE_OTHER): Payer: Commercial Managed Care - HMO

## 2013-08-21 ENCOUNTER — Ambulatory Visit: Payer: Commercial Managed Care - HMO | Admitting: Pulmonary Disease

## 2013-08-21 VITALS — BP 132/58 | HR 58 | Temp 98.0°F | Ht 71.0 in | Wt 176.0 lb

## 2013-08-21 DIAGNOSIS — J45909 Unspecified asthma, uncomplicated: Secondary | ICD-10-CM

## 2013-08-21 DIAGNOSIS — J4530 Mild persistent asthma, uncomplicated: Secondary | ICD-10-CM

## 2013-08-21 DIAGNOSIS — I6529 Occlusion and stenosis of unspecified carotid artery: Secondary | ICD-10-CM

## 2013-08-21 DIAGNOSIS — J189 Pneumonia, unspecified organism: Secondary | ICD-10-CM

## 2013-08-21 DIAGNOSIS — D649 Anemia, unspecified: Secondary | ICD-10-CM

## 2013-08-21 LAB — BASIC METABOLIC PANEL
BUN: 21 mg/dL (ref 6–23)
CALCIUM: 8.8 mg/dL (ref 8.4–10.5)
CO2: 25 mEq/L (ref 19–32)
CREATININE: 1 mg/dL (ref 0.4–1.5)
Chloride: 107 mEq/L (ref 96–112)
GFR: 76.03 mL/min (ref >60.00)
Glucose, Bld: 100 mg/dL — ABNORMAL HIGH (ref 70–99)
Potassium: 5.3 mEq/L — ABNORMAL HIGH (ref 3.5–5.1)
Sodium: 140 mEq/L (ref 135–145)

## 2013-08-21 LAB — CBC WITH DIFFERENTIAL/PLATELET
Basophils Absolute: 0 10*3/uL (ref 0.0–0.1)
Basophils Relative: 0 % (ref 0.0–3.0)
EOS ABS: 0.1 10*3/uL (ref 0.0–0.7)
Eosinophils Relative: 1.4 % (ref 0.0–5.0)
HCT: 35.2 % — ABNORMAL LOW (ref 39.0–52.0)
HEMOGLOBIN: 11.8 g/dL — AB (ref 13.0–17.0)
LYMPHS ABS: 1.1 10*3/uL (ref 0.7–4.0)
LYMPHS PCT: 10.8 % — AB (ref 12.0–46.0)
MCHC: 33.5 g/dL (ref 30.0–36.0)
MCV: 91.1 fl (ref 78.0–100.0)
Monocytes Absolute: 0.4 10*3/uL (ref 0.1–1.0)
Monocytes Relative: 3.8 % (ref 3.0–12.0)
NEUTROS ABS: 8.6 10*3/uL — AB (ref 1.4–7.7)
Neutrophils Relative %: 84 % — ABNORMAL HIGH (ref 43.0–77.0)
Platelets: 438 10*3/uL — ABNORMAL HIGH (ref 150.0–400.0)
RBC: 3.87 Mil/uL — ABNORMAL LOW (ref 4.22–5.81)
RDW: 14.4 % (ref 11.5–15.5)
WBC: 10.2 10*3/uL (ref 4.0–10.5)

## 2013-08-21 NOTE — Assessment & Plan Note (Signed)
Blood work & CXR today to f/u pna, anemia &leucocytosis

## 2013-08-21 NOTE — Assessment & Plan Note (Signed)
Decrease ibuprofen usage (also on ASA + plavix) - high risk UGIB OK to take tylenol instead

## 2013-08-21 NOTE — Patient Instructions (Signed)
Blood work & CXR today  Stay on symbicort - 2 puffs twice daily - rinse mouth after

## 2013-08-21 NOTE — Progress Notes (Signed)
   Subjective:    Patient ID: [name], male    DOB: 30-Jul-1929, 80 y.o.   MRN: [id number]  HPI  80 year old remote smoker presents for FU of copd vs asthma.  Smoked 2 packs per week for about 30 years before he quit in 1980.  He reports asthma as a child which recurred as an adult but has not bothered him in the last decade.Dr. Harrington Challenger started him on Flovent and albuterol when necessary for wheezing. He underwent allergy evaluation by Dr. Donneta Romberg , allergy testing was nondiagnostic, he was started on Singulair Spirometry showed moderate airway obstruction with FEV1 62%, FVC 68% and ratio of 65.  Environment- he lives in a 78 year old home with cats, hardwood floors, 78-year-old mattress and central air conditioning, and obvious exposure to mold    08/21/2013   Chief Complaint  Patient presents with  . Follow-up    Pt d/c from hospital 08/11/13. Pt is feeling weak. Reports breathing is good. Occas wheezing/chest tx, very little prod ocugh-yellow phlem when mucus does come up.    Last seen 11/07/2012 Follow Up Asthma  Adm 7/30 15 for fever,left-sided pneumonia and leukocytosis - 18k. ABG 7.36/43/52 Increased somnolence, head CT, EEG neg - Tramadol decreased , on ibuprofen now -using 2 tabs bid   Review of Systems neg for any significant sore throat, dysphagia, itching, sneezing, nasal congestion or excess/ purulent secretions, fever, chills, sweats, unintended wt loss, pleuritic or exertional cp, hempoptysis, orthopnea pnd or change in chronic leg swelling. Also denies presyncope, palpitations, heartburn, abdominal pain, nausea, vomiting, diarrhea or change in bowel or urinary habits, dysuria,hematuria, rash, arthralgias, visual complaints, headache, numbness weakness or ataxia.     Objective:   Physical Exam  Gen. Pleasant, well-nourished, in no distress, normal affect ENT - no lesions, no post nasal drip Neck: No JVD, no thyromegaly, no carotid bruits Lungs: no use of accessory  muscles, no dullness to percussion, LLL rales, no rhonchi  Cardiovascular: Rhythm regular, heart sounds  normal, no murmurs or gallops, no peripheral edema Abdomen: soft and non-tender, no hepatosplenomegaly, BS normal. Musculoskeletal: No deformities, no cyanosis or clubbing Neuro:  alert, non focal       Assessment & Plan:

## 2013-08-21 NOTE — Assessment & Plan Note (Signed)
Stay on symbicort - 2 puffs twice daily - rinse mouth after Spiro-pre/post  Next visit to look for reversibility - /asthma vs copd

## 2013-08-26 ENCOUNTER — Other Ambulatory Visit: Payer: Self-pay | Admitting: Pulmonary Disease

## 2013-08-30 ENCOUNTER — Other Ambulatory Visit: Payer: Self-pay

## 2013-08-30 MED ORDER — CLONIDINE HCL 0.2 MG PO TABS: 0.2000 mg | ORAL_TABLET | Freq: Two times a day (BID) | ORAL | Status: DC

## 2013-09-01 ENCOUNTER — Other Ambulatory Visit: Payer: Self-pay | Admitting: *Deleted

## 2013-09-01 MED ORDER — CLONIDINE HCL 0.2 MG PO TABS: 0.2000 mg | ORAL_TABLET | Freq: Two times a day (BID) | ORAL | Status: DC

## 2013-09-01 NOTE — Telephone Encounter (Signed)
Pt states he is needing a updated script on his catapress 0.2mg  sent to [facility] right source. Inform pt will send...Johny Chess

## 2013-09-04 ENCOUNTER — Telehealth: Payer: Self-pay | Admitting: Internal Medicine

## 2013-09-04 NOTE — Telephone Encounter (Signed)
Pt's spouse request phone cal from the assistant concern about Clonidine that was send in today. Please call her

## 2013-09-09 ENCOUNTER — Encounter: Payer: Self-pay | Admitting: Internal Medicine

## 2013-09-11 NOTE — Telephone Encounter (Signed)
Pt states he is needing a updated script on his catapress 0.2mg  sent to [facility] right source. Inform pt will send.  Pt said she would like a call to know it has been sent.   Best number home number [phone].

## 2013-09-12 ENCOUNTER — Other Ambulatory Visit: Payer: Self-pay

## 2013-09-12 MED ORDER — CLONIDINE HCL 0.1 MG PO TABS: 0.1000 mg | ORAL_TABLET | Freq: Every day | ORAL | Status: DC

## 2013-09-12 MED ORDER — CLONIDINE HCL 0.2 MG PO TABS: 0.2000 mg | ORAL_TABLET | Freq: Two times a day (BID) | ORAL | Status: DC

## 2013-09-26 ENCOUNTER — Telehealth: Payer: Self-pay | Admitting: Pulmonary Disease

## 2013-09-26 NOTE — Telephone Encounter (Signed)
Spoke with the pt's spouse  She is asking if he needs Prevnar  Last pneumovax was given in 2005  Please advise, thanks!

## 2013-09-26 NOTE — Telephone Encounter (Signed)
I called spoke with spouse. Pt is scheduled to see RA in October and will await that time. Nothing further needed

## 2013-09-26 NOTE — Telephone Encounter (Signed)
Yes - please arrange for him to get this in our office.

## 2013-10-02 ENCOUNTER — Ambulatory Visit: Payer: Commercial Managed Care - HMO | Admitting: Internal Medicine

## 2013-10-02 DIAGNOSIS — Z0289 Encounter for other administrative examinations: Secondary | ICD-10-CM

## 2013-10-03 ENCOUNTER — Telehealth: Payer: Self-pay | Admitting: Pulmonary Disease

## 2013-10-03 NOTE — Telephone Encounter (Signed)
Spoke with the pt's spouse  She states that pt got his flu shot yesterday at the pharmacy and the pharmacist mentioned prevnar 13  She wants to know if it RA wants her to have this or the regular pneumovax  Please advise, and he will get this one at the pharm  Thanks!

## 2013-10-03 NOTE — Telephone Encounter (Signed)
Called spouse. Aware he is fine to prevnar 13. She will inform the pharmacy and needed nothing further

## 2013-10-03 NOTE — Telephone Encounter (Signed)
prevnar ok He can get it with [place] or at the pharmacy

## 2013-10-03 NOTE — Telephone Encounter (Signed)
LMTCB

## 2013-10-20 ENCOUNTER — Encounter: Payer: Self-pay | Admitting: Pulmonary Disease

## 2013-10-20 ENCOUNTER — Ambulatory Visit (INDEPENDENT_AMBULATORY_CARE_PROVIDER_SITE_OTHER): Payer: Commercial Managed Care - HMO | Admitting: Pulmonary Disease

## 2013-10-20 VITALS — BP 110/58 | HR 72 | Temp 97.7°F | Ht 71.0 in | Wt 178.8 lb

## 2013-10-20 DIAGNOSIS — R400 Somnolence: Secondary | ICD-10-CM

## 2013-10-20 NOTE — Progress Notes (Signed)
   Subjective:    Patient ID: [name], male    DOB: May 22, 1929, 80 y.o.   MRN: [id number]  HPI  80 year old remote smoker presents for FU of copd vs asthma.  Smoked 2 packs per week for about 30 years before he quit in 1980.  He reports asthma as a child which recurred as an adult but has not bothered him in the last decade.He underwent allergy evaluation by Dr. Donneta Romberg , allergy testing was nondiagnostic, he was started on Singulair  Spirometry showed moderate airway obstruction with FEV1 62%, FVC 68% and ratio of 65.  Environment- he lives in a 65 year old home with cats, hardwood floors, 45-year-old mattress and central air conditioning, and obvious exposure to mold    10/20/2013  Chief Complaint  Patient presents with  . Follow-up    Follow up for CAP; no complaints   C/o excessive somnolence  -sleeps till noon On clonidine , neurontin + aldomet Requests rx for tramadol Took pneumovax/ flu shot  Adm 08/10/13 for fever,left-sided pneumonia and leukocytosis - 18k. ABG 7.36/43/52  Increased somnolence, head CT, EEG neg - Tramadol decreased  CXR 08/2013 - resolving infx Immunizations up-to-date  Review of Systems neg for any significant sore throat, dysphagia, itching, sneezing, nasal congestion or excess/ purulent secretions, fever, chills, sweats, unintended wt loss, pleuritic or exertional cp, hempoptysis, orthopnea pnd or change in chronic leg swelling. Also denies presyncope, palpitations, heartburn, abdominal pain, nausea, vomiting, diarrhea or change in bowel or urinary habits, dysuria,hematuria, rash, arthralgias, visual complaints, headache, numbness weakness or ataxia.     Objective:   Physical Exam  Gen. Pleasant, well-nourished, in no distress ENT - no lesions, no post nasal drip Neck: No JVD, no thyromegaly, no carotid bruits Lungs: no use of accessory muscles, no dullness to percussion, clear without rales or rhonchi  Cardiovascular: Rhythm regular, heart sounds   normal, no murmurs or gallops, no peripheral edema Musculoskeletal: No deformities, no cyanosis or clubbing        Assessment & Plan:

## 2013-10-20 NOTE — Assessment & Plan Note (Signed)
Probably related to medications-Neurontin, clonidine and tramadol

## 2013-10-20 NOTE — Patient Instructions (Signed)
Stay on symbicort twice daily- rinse mouth after use Pneumonia has resolved

## 2013-10-20 NOTE — Assessment & Plan Note (Signed)
Stay on symbicort twice daily- rinse mouth after use Pneumonia has resolved Favor ACOS - needs post BD testing in the future to clarify

## 2013-10-24 ENCOUNTER — Encounter: Payer: Self-pay | Admitting: Internal Medicine

## 2013-10-24 ENCOUNTER — Telehealth: Payer: Self-pay | Admitting: Pulmonary Disease

## 2013-10-24 NOTE — Telephone Encounter (Signed)
Called spoke with pt. Aware we currently do not have any samples. He will call us next week and see if we do then. Nothing further needed

## 2013-11-01 ENCOUNTER — Telehealth: Payer: Self-pay | Admitting: Internal Medicine

## 2013-11-01 NOTE — Telephone Encounter (Signed)
Patient wife ask if received, fax of X ray of patient back, Please advise

## 2013-11-01 NOTE — Telephone Encounter (Signed)
LVM to return my call °

## 2013-11-06 ENCOUNTER — Telehealth: Payer: Self-pay | Admitting: Pulmonary Disease

## 2013-11-06 MED ORDER — BUDESONIDE-FORMOTEROL FUMARATE 160-4.5 MCG/ACT IN AERO: 2.0000 | INHALATION_SPRAY | Freq: Two times a day (BID) | RESPIRATORY_TRACT | Status: DC

## 2013-11-06 NOTE — Telephone Encounter (Signed)
1 sample of symbicort up front for pick up  Pt aware

## 2013-11-07 ENCOUNTER — Telehealth: Payer: Self-pay

## 2013-11-07 NOTE — Telephone Encounter (Signed)
Pt wife came in today and wanted to give the information for XRAY of pt.   The written results are in scanned into media.   Pt wife informed.

## 2013-11-10 ENCOUNTER — Other Ambulatory Visit: Payer: Self-pay | Admitting: Internal Medicine

## 2013-11-20 ENCOUNTER — Ambulatory Visit: Payer: Commercial Managed Care - HMO | Admitting: Pulmonary Disease

## 2013-11-20 ENCOUNTER — Other Ambulatory Visit: Payer: Self-pay | Admitting: Pulmonary Disease

## 2013-11-22 ENCOUNTER — Telehealth: Payer: Self-pay

## 2013-11-22 NOTE — Telephone Encounter (Signed)
Pt wifes called to ask not to [facility]. The only medication is the Aldomet.

## 2013-11-30 ENCOUNTER — Ambulatory Visit (INDEPENDENT_AMBULATORY_CARE_PROVIDER_SITE_OTHER): Payer: Commercial Managed Care - HMO | Admitting: Internal Medicine

## 2013-11-30 ENCOUNTER — Encounter: Payer: Self-pay | Admitting: Internal Medicine

## 2013-11-30 ENCOUNTER — Other Ambulatory Visit (INDEPENDENT_AMBULATORY_CARE_PROVIDER_SITE_OTHER): Payer: Commercial Managed Care - HMO

## 2013-11-30 VITALS — BP 136/58 | HR 55 | Temp 97.6°F | Ht 71.0 in | Wt 183.2 lb

## 2013-11-30 DIAGNOSIS — G8929 Other chronic pain: Secondary | ICD-10-CM

## 2013-11-30 DIAGNOSIS — Z0000 Encounter for general adult medical examination without abnormal findings: Secondary | ICD-10-CM

## 2013-11-30 DIAGNOSIS — I10 Essential (primary) hypertension: Secondary | ICD-10-CM

## 2013-11-30 LAB — BASIC METABOLIC PANEL
BUN: 21 mg/dL (ref 6–23)
CO2: 25 mEq/L (ref 19–32)
Calcium: 9.3 mg/dL (ref 8.4–10.5)
Chloride: 111 mEq/L (ref 96–112)
Creatinine, Ser: 1 mg/dL (ref 0.4–1.5)
GFR: 75.98 mL/min (ref >60.00)
GLUCOSE: 118 mg/dL — AB (ref 70–99)
Potassium: 4.8 mEq/L (ref 3.5–5.1)
Sodium: 143 mEq/L (ref 135–145)

## 2013-11-30 LAB — URINALYSIS, ROUTINE W REFLEX MICROSCOPIC
Bilirubin Urine: NEGATIVE
Ketones, ur: NEGATIVE
LEUKOCYTES UA: NEGATIVE
NITRITE: NEGATIVE
Specific Gravity, Urine: 1.025 (ref 1.000–1.030)
Total Protein, Urine: 30 — AB
UROBILINOGEN UA: 0.2 (ref 0.0–1.0)
Urine Glucose: NEGATIVE
pH: 5.5 (ref 5.0–8.0)

## 2013-11-30 LAB — HEPATIC FUNCTION PANEL
ALK PHOS: 55 U/L (ref 39–117)
ALT: 10 U/L (ref 0–53)
AST: 12 U/L (ref 0–37)
Albumin: 3.9 g/dL (ref 3.5–5.2)
BILIRUBIN DIRECT: 0.1 mg/dL (ref 0.0–0.3)
Total Bilirubin: 0.6 mg/dL (ref 0.2–1.2)
Total Protein: 6.6 g/dL (ref 6.0–8.3)

## 2013-11-30 LAB — CBC WITH DIFFERENTIAL/PLATELET
Basophils Absolute: 0 10*3/uL (ref 0.0–0.1)
Basophils Relative: 0.2 % (ref 0.0–3.0)
Eosinophils Absolute: 0.2 10*3/uL (ref 0.0–0.7)
Eosinophils Relative: 2.8 % (ref 0.0–5.0)
HCT: 38.4 % — ABNORMAL LOW (ref 39.0–52.0)
Hemoglobin: 12.8 g/dL — ABNORMAL LOW (ref 13.0–17.0)
Lymphocytes Relative: 16.5 % (ref 12.0–46.0)
Lymphs Abs: 1.2 10*3/uL (ref 0.7–4.0)
MCHC: 33.3 g/dL (ref 30.0–36.0)
MCV: 91 fl (ref 78.0–100.0)
MONO ABS: 0.5 10*3/uL (ref 0.1–1.0)
Monocytes Relative: 7.2 % (ref 3.0–12.0)
NEUTROS PCT: 73.3 % (ref 43.0–77.0)
Neutro Abs: 5.2 10*3/uL (ref 1.4–7.7)
Platelets: 275 10*3/uL (ref 150.0–400.0)
RBC: 4.22 Mil/uL (ref 4.22–5.81)
RDW: 15.5 % (ref 11.5–15.5)
WBC: 7.1 10*3/uL (ref 4.0–10.5)

## 2013-11-30 LAB — LIPID PANEL
CHOL/HDL RATIO: 3
Cholesterol: 103 mg/dL (ref 0–200)
HDL: 38.7 mg/dL — ABNORMAL LOW (ref >39.00)
LDL Cholesterol: 50 mg/dL (ref 0–99)
NONHDL: 64.3
TRIGLYCERIDES: 72 mg/dL (ref 0.0–149.0)
VLDL: 14.4 mg/dL (ref 0.0–40.0)

## 2013-11-30 LAB — TSH: TSH: 3.49 u[IU]/mL (ref 0.35–4.50)

## 2013-11-30 MED ORDER — TRAMADOL HCL 50 MG PO TABS: 50.0000 mg | ORAL_TABLET | Freq: Two times a day (BID) | ORAL | Status: DC

## 2013-11-30 NOTE — Progress Notes (Signed)
Subjective:    Patient ID: [name], male    DOB: Nov 13, 1929, 80 y.o.   MRN: [id number]  HPI   Here for medicare wellness  Diet: heart healthy  Physical activity: sedentary Depression/mood screen: negative Hearing: intact to whispered voice Visual acuity: grossly normal, performs annual eye exam  ADLs: capable Fall risk: none Home safety: good Cognitive evaluation: intact to orientation, naming, recall and repetition EOL planning: adv directives, full code/ I agree  I have personally reviewed and have noted 1. The patient's medical and social history 2. Their use of alcohol, tobacco or illicit drugs 3. Their current medications and supplements 4. The patient's functional ability including ADL's, fall risks, home safety risks and hearing or visual impairment. 5. Diet and physical activities 6. Evidence for depression or mood disorders  Also reviewed chronic medical issues and interval medical events  Past Medical History  Diagnosis Date  . HTN (hypertension)     Negative renal duplex 07-22-11  . 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 Esophageal stricture     Distal, benign  . Diverticulosis   . GERD (gastroesophageal reflux disease)   . Hemorrhoids   . Allergic rhinitis   . Prostate cancer 2000    Seed XRT  . Dyslipidemia   . Chronic pain syndrome     Left shoulder, back  . Peripheral vascular disease     Left CEA 2002  . Asthma   . Anemia    Family History  Problem Relation Age of Onset  . Lung cancer Brother   . Stroke Sister    History  Substance Use Topics  . Smoking status: Former Smoker -- 0.25 packs/day for 35 years    Types: Cigarettes    Quit date: 01/12/1978  . Smokeless tobacco: Current User    Types: Chew  . Alcohol Use: No     Comment: Prior alcoholic, sober since 7829    Review of Systems  Constitutional: Negative for fever, activity change, appetite change, fatigue and unexpected weight change.  Respiratory:  Negative for cough, chest tightness, shortness of breath and wheezing.   Cardiovascular: Negative for chest pain, palpitations and leg swelling.  Musculoskeletal: Positive for back pain (chronic) and gait problem. Negative for joint swelling.  Neurological: Negative for dizziness, weakness and headaches.  Psychiatric/Behavioral: Negative for dysphoric mood. The patient is not nervous/anxious.   All other systems reviewed and are negative.      Objective:   Physical Exam  BP 136/58 mmHg  Pulse 55  Temp(Src) 97.6 F (36.4 C) (Oral)  Ht 5\' 11"  (1.803 m)  Wt 183 lb 4 oz (83.122 kg)  BMI 25.57 kg/m2  SpO2 96% Wt Readings from Last 3 Encounters:  11/30/13 183 lb 4 oz (83.122 kg)  10/20/13 178 lb 12.8 oz (81.103 kg)  08/21/13 176 lb (79.833 kg)   Constitutional: he appears well-developed and well-nourished. No distress.  HENT: Head: Normocephalic and atraumatic. Ears: B TMs ok, no erythema or effusion; Nose: Nose normal. Mouth/Throat: Oropharynx is clear and moist. No oropharyngeal exudate.  Eyes: Conjunctivae and EOM are normal. Pupils are equal, round, and reactive to light. No scleral icterus.  Neck: Normal range of motion. Neck supple. No JVD present. No thyromegaly present.  Cardiovascular: Normal rate, regular rhythm and normal heart sounds.  No murmur heard. No BLE edema. Pulmonary/Chest: Effort normal and breath sounds normal. No respiratory distress. he has no wheezes.  Abdominal: Soft. Bowel sounds are normal. he  exhibits no distension. There is no tenderness. no masses GU: defer Musculoskeletal: Back: full range of motion of thoracic and lumbar spine. Non tender to palpation. Sensation intact in all dermatomes of the lower extremities. Full strength to manual muscle testing. Patient ambulates with antalgic gait. Neurological: he is alert and oriented to person, place, and time. No cranial nerve deficit. Coordination, balance, strength, speech and gait are normal.  Skin: Skin is  warm and dry. No rash noted. No erythema.  Psychiatric: he has a normal mood and affect. behavior is normal. Judgment and thought content normal.  Lab Results  Component Value Date   WBC 10.2 08/21/2013   HGB 11.8* 08/21/2013   HCT 35.2* 08/21/2013   PLT 438.0* 08/21/2013   GLUCOSE 100* 08/21/2013   CHOL 91 06/30/2013   TRIG 98.0 06/30/2013   HDL 32.40* 06/30/2013   LDLCALC 39 06/30/2013   ALT 12 05/04/2012   AST 17 05/04/2012   NA 140 08/21/2013   K 5.3* 08/21/2013   CL 107 08/21/2013   CREATININE 1.0 08/21/2013   BUN 21 08/21/2013   CO2 25 08/21/2013   TSH 1.430 08/10/2013   PSA 0.02* 12/11/2008   INR 0.97 02/06/2010   HGBA1C 6.4 05/04/2012    Dg Chest 2 View  08/22/2013   CLINICAL DATA:  Followup pneumonia  EXAM: CHEST  2 VIEW  COMPARISON:  Prior chest x-ray 08/10/2013  FINDINGS: Improving left perihilar and basilar airspace disease. Probable trace left pleural effusion. No new focal airspace consolidation, pulmonary edema or pneumothorax. Stable cardiac and mediastinal contours. Atherosclerotic calcification present in the transverse aorta. No acute osseous abnormality. Incompletely imaged anterior cervical fusion hardware.  IMPRESSION: 1. Improving left perihilar and basilar airspace disease suggests resolving pneumonia. 2. Probable trace left pleural effusion and associated subsegmental atelectasis.   Electronically Signed   By: Jacqulynn Cadet M.D.   On: 08/22/2013 08:17       Assessment & Plan:   AWV/CPX/z00.00 - Today patient counseled on age appropriate routine health concerns for screening and prevention, each reviewed and up to date or declined. Immunizations reviewed and up to date or declined. Labs ordered and reviewed. Risk factors for depression reviewed and negative. Hearing function and visual acuity are intact. ADLs screened and addressed as needed. Functional ability and level of safety reviewed and appropriate. Education, counseling and referrals performed  based on assessed risks today. Patient provided with a copy of personalized plan for preventive services.  Problem List Items Addressed This Visit    Chronic pain    Chronic pain syndrome related to back and left shoulder. Prior steroid injections by orthopedics ([facility]) with limited relief Reviewed prior history of substance abuse including alcohol and narcotics. Wife reviews and confirms same. I explained to patient we would help with his symptoms through alternative management and avoid narcotic use. Continue tramadol BID prn and steroid shots as needed - reduced to max 2 tab/day, prev 6 tabs/day Can consider referral to pain clinic in future if needed.    Relevant Medications      traMADol (ULTRAM) 50 MG tablet   HYPERTENSION, BENIGN ESSENTIAL    BP Readings from Last 3 Encounters:  11/30/13 136/58  10/20/13 110/58  08/21/13 132/58   The current medical regimen is effective;  continue present plan and medications.     Other Visit Diagnoses    Routine general medical examination at a health care facility    -  Primary    Relevant Orders  Basic metabolic panel       CBC with Differential       Hepatic function panel       Lipid panel       TSH       Urinalysis, Routine w reflex microscopic

## 2013-11-30 NOTE — Progress Notes (Signed)
Pre visit review using our clinic review tool, if applicable. No additional management support is needed unless otherwise documented below in the visit note. 

## 2013-11-30 NOTE — Assessment & Plan Note (Signed)
BP Readings from Last 3 Encounters:  11/30/13 136/58  10/20/13 110/58  08/21/13 132/58   The current medical regimen is effective;  continue present plan and medications.

## 2013-11-30 NOTE — Patient Instructions (Addendum)
It was good to see you today.  We have reviewed your prior records including labs and tests today  Health Maintenance reviewed - all recommended immunizations and age-appropriate screenings are up-to-date.  Test(s) ordered today. Your results will be released to [facility] (or called to you) after review, usually within 72hours after test completion. If any changes need to be made, you will be notified at that same time.  Medications reviewed and updated, no changes recommended at this time. Refill on medication(s) as discussed today.  Stay active - walk at least 20-30 minutes every day!  Please schedule followup in 12 months for annual exam and labs, call sooner if problems.  Health Maintenance A healthy lifestyle and preventative care can promote health and wellness.  Maintain regular health, dental, and eye exams.  Eat a healthy diet. Foods like vegetables, fruits, whole grains, low-fat dairy products, and lean protein foods contain the nutrients you need and are low in calories. Decrease your intake of foods high in solid fats, added sugars, and salt. Get information about a proper diet from your health care provider, if necessary.  Regular physical exercise is one of the most important things you can do for your health. Most adults should get at least 150 minutes of moderate-intensity exercise (any activity that increases your heart rate and causes you to sweat) each week. In addition, most adults need muscle-strengthening exercises on 2 or more days a week.   Maintain a healthy weight. The body mass index (BMI) is a screening tool to identify possible weight problems. It provides an estimate of body fat based on height and weight. Your health care provider can find your BMI and can help you achieve or maintain a healthy weight. For males 20 years and older:  A BMI below 18.5 is considered underweight.  A BMI of 18.5 to 24.9 is normal.  A BMI of 25 to 29.9 is considered  overweight.  A BMI of 30 and above is considered obese.  Maintain normal blood lipids and cholesterol by exercising and minimizing your intake of saturated fat. Eat a balanced diet with plenty of fruits and vegetables. Blood tests for lipids and cholesterol should begin at age 69 and be repeated every 5 years. If your lipid or cholesterol levels are high, you are over age 83, or you are at high risk for heart disease, you may need your cholesterol levels checked more frequently.Ongoing high lipid and cholesterol levels should be treated with medicines if diet and exercise are not working.  If you smoke, find out from your health care provider how to quit. If you do not use tobacco, do not start.  Lung cancer screening is recommended for adults aged 12-80 years who are at high risk for developing lung cancer because of a history of smoking. A yearly low-dose CT scan of the lungs is recommended for people who have at least a 30-pack-year history of smoking and are current smokers or have quit within the past 15 years. A pack year of smoking is smoking an average of 1 pack of cigarettes a day for 1 year (for example, a 30-pack-year history of smoking could mean smoking 1 pack a day for 30 years or 2 packs a day for 15 years). Yearly screening should continue until the smoker has stopped smoking for at least 15 years. Yearly screening should be stopped for people who develop a health problem that would prevent them from having lung cancer treatment.  If you choose to drink alcohol,  do not have more than 2 drinks per day. One drink is considered to be 12 oz (360 mL) of beer, 5 oz (150 mL) of wine, or 1.5 oz (45 mL) of liquor.  Avoid the use of street drugs. Do not share needles with anyone. Ask for help if you need support or instructions about stopping the use of drugs.  High blood pressure causes heart disease and increases the risk of stroke. Blood pressure should be checked at least every 1-2 years.  Ongoing high blood pressure should be treated with medicines if weight loss and exercise are not effective.  If you are 78-66 years old, ask your health care provider if you should take aspirin to prevent heart disease.  Diabetes screening involves taking a blood sample to check your fasting blood sugar level. This should be done once every 3 years after age 69 if you are at a normal weight and without risk factors for diabetes. Testing should be considered at a younger age or be carried out more frequently if you are overweight and have at least 1 risk factor for diabetes.  Colorectal cancer can be detected and often prevented. Most routine colorectal cancer screening begins at the age of 57 and continues through age 52. However, your health care provider may recommend screening at an earlier age if you have risk factors for colon cancer. On a yearly basis, your health care provider may provide home test kits to check for hidden blood in the stool. A small camera at the end of a tube may be used to directly examine the colon (sigmoidoscopy or colonoscopy) to detect the earliest forms of colorectal cancer. Talk to your health care provider about this at age 4 when routine screening begins. A direct exam of the colon should be repeated every 5-10 years through age 12, unless early forms of precancerous polyps or small growths are found.  People who are at an increased risk for hepatitis B should be screened for this virus. You are considered at high risk for hepatitis B if:  You were born in a country where hepatitis B occurs often. Talk with your health care provider about which countries are considered high risk.  Your parents were born in a high-risk country and you have not received a shot to protect against hepatitis B (hepatitis B vaccine).  You have HIV or AIDS.  You use needles to inject street drugs.  You live with, or have sex with, someone who has hepatitis B.  You are a man who has  sex with other men (MSM).  You get hemodialysis treatment.  You take certain medicines for conditions like cancer, organ transplantation, and autoimmune conditions.  Hepatitis C blood testing is recommended for all people born from [birth year] through 1963 and any individual with known risk factors for hepatitis C.  Healthy men should no longer receive prostate-specific antigen (PSA) blood tests as part of routine cancer screening. Talk to your health care provider about prostate cancer screening.  Testicular cancer screening is not recommended for adolescents or adult males who have no symptoms. Screening includes self-exam, a health care provider exam, and other screening tests. Consult with your health care provider about any symptoms you have or any concerns you have about testicular cancer.  Practice safe sex. Use condoms and avoid high-risk sexual practices to reduce the spread of sexually transmitted infections (STIs).  You should be screened for STIs, including gonorrhea and chlamydia if:  You are sexually active and are younger  than 24 years.  You are older than 24 years, and your health care provider tells you that you are at risk for this type of infection.  Your sexual activity has changed since you were last screened, and you are at an increased risk for chlamydia or gonorrhea. Ask your health care provider if you are at risk.  If you are at risk of being infected with HIV, it is recommended that you take a prescription medicine daily to prevent HIV infection. This is called pre-exposure prophylaxis (PrEP). You are considered at risk if:  You are a man who has sex with other men (MSM).  You are a heterosexual man who is sexually active with multiple partners.  You take drugs by injection.  You are sexually active with a partner who has HIV.  Talk with your health care provider about whether you are at high risk of being infected with HIV. If you choose to begin PrEP, you should  first be tested for HIV. You should then be tested every 3 months for as long as you are taking PrEP.  Use sunscreen. Apply sunscreen liberally and repeatedly throughout the day. You should seek shade when your shadow is shorter than you. Protect yourself by wearing long sleeves, pants, a wide-brimmed hat, and sunglasses year round whenever you are outdoors.  Tell your health care provider of new moles or changes in moles, especially if there is a change in shape or color. Also, tell your health care provider if a mole is larger than the size of a pencil eraser.  A one-time screening for abdominal aortic aneurysm (AAA) and surgical repair of large AAAs by ultrasound is recommended for men aged 15-75 years who are current or former smokers.  Stay current with your vaccines (immunizations). Document Released: 06/27/2007 Document Revised: 01/03/2013 Document Reviewed: 05/26/2010 [facility] Patient Information 2015 [place], [place]. This information is not intended to replace advice given to you by your health care provider. Make sure you discuss any questions you have with your health care provider.

## 2013-11-30 NOTE — Assessment & Plan Note (Signed)
Chronic pain syndrome related to back and left shoulder. Prior steroid injections by orthopedics ([facility]) with limited relief Reviewed prior history of substance abuse including alcohol and narcotics. Wife reviews and confirms same. I explained to patient we would help with his symptoms through alternative management and avoid narcotic use. Continue tramadol BID prn and steroid shots as needed - reduced to max 2 tab/day, prev 6 tabs/day Can consider referral to pain clinic in future if needed.

## 2013-12-04 ENCOUNTER — Telehealth: Payer: Self-pay | Admitting: Internal Medicine

## 2013-12-04 NOTE — Telephone Encounter (Signed)
Pt wife called in and wanted to know why [name] didn't get a tetanus shot.  She also wanted someone to call her Abdias Hickam) and give her and her husband there results of there blood work.

## 2013-12-05 NOTE — Telephone Encounter (Signed)
Spoke to pt and gave results and indicated that they were also mailed.   Do I need to call the pt back and have him get a tetanus shot?

## 2013-12-06 ENCOUNTER — Encounter: Payer: Self-pay | Admitting: Internal Medicine

## 2013-12-06 NOTE — Telephone Encounter (Signed)
Yes, Td thanks

## 2013-12-06 NOTE — Telephone Encounter (Signed)
Okay for nurse visit to administer tetanus if desired by family- May have been oversight at prior visit, but okay to update Thanks

## 2013-12-06 NOTE — Telephone Encounter (Signed)
Pt informed:   Which Tetanus does need? (Tdap or Td: probably Td)

## 2013-12-11 ENCOUNTER — Telehealth: Payer: Self-pay | Admitting: Internal Medicine

## 2013-12-11 NOTE — Telephone Encounter (Signed)
Nurse visit scheduled for pt to have td. (2nd time that pt wife was told about nurse visit).  Informed pt that a note would not be given and that a phone call was already made, a letter was also sent with all labs for both pt and wife; that we had done recently.

## 2013-12-11 NOTE — Telephone Encounter (Signed)
[name] called upset, husband did not receive his tetanus shot and he is over due. Wife upset her husband got results of his urine test however she did not. Pt wanted a note sent regards to this.

## 2013-12-12 ENCOUNTER — Ambulatory Visit (INDEPENDENT_AMBULATORY_CARE_PROVIDER_SITE_OTHER): Payer: Commercial Managed Care - HMO | Admitting: *Deleted

## 2013-12-12 DIAGNOSIS — Z23 Encounter for immunization: Secondary | ICD-10-CM

## 2013-12-27 ENCOUNTER — Telehealth: Payer: Self-pay | Admitting: Internal Medicine

## 2013-12-27 ENCOUNTER — Other Ambulatory Visit: Payer: Self-pay

## 2013-12-27 DIAGNOSIS — Z0000 Encounter for general adult medical examination without abnormal findings: Secondary | ICD-10-CM

## 2013-12-27 DIAGNOSIS — R062 Wheezing: Secondary | ICD-10-CM

## 2013-12-27 MED ORDER — OMEPRAZOLE 40 MG PO CPDR: 40.0000 mg | DELAYED_RELEASE_CAPSULE | Freq: Every day | ORAL | Status: DC

## 2013-12-27 NOTE — Telephone Encounter (Signed)
Wife called stating she rec'd $95 bill for eye appt in June 2015 even though there was or should of been a referral for that, ins told pt that if MD can send a referral they can file insurance so pt will no have to be pay this.  Dr. Prudencio Burly was the Opthamologist he saw.  [phone] best number

## 2013-12-29 ENCOUNTER — Other Ambulatory Visit: Payer: Self-pay | Admitting: *Deleted

## 2013-12-29 MED ORDER — AMLODIPINE BESYLATE 5 MG PO TABS: 5.0000 mg | ORAL_TABLET | Freq: Two times a day (BID) | ORAL | Status: DC

## 2014-01-15 ENCOUNTER — Telehealth: Payer: Self-pay | Admitting: Internal Medicine

## 2014-01-15 NOTE — Telephone Encounter (Signed)
Is requesting [employer] referral to [facility]. Matilde Sprang.  Patient has appointment this Friday 01/19/14 in the morning.

## 2014-01-17 ENCOUNTER — Telehealth: Payer: Self-pay | Admitting: Pulmonary Disease

## 2014-01-17 NOTE — Telephone Encounter (Signed)
Samples are up front for pick up. Pt is aware. 

## 2014-01-17 NOTE — Telephone Encounter (Signed)
auth # [id number] valid 01/19/14-07/18/14 for 6 visits

## 2014-01-19 DIAGNOSIS — N3946 Mixed incontinence: Secondary | ICD-10-CM | POA: Diagnosis not present

## 2014-01-25 ENCOUNTER — Telehealth: Payer: Self-pay | Admitting: Internal Medicine

## 2014-01-25 NOTE — Telephone Encounter (Signed)
[facility] do not have active rx for Tramadol.   Called pt wife and informed of above.

## 2014-01-25 NOTE — Telephone Encounter (Signed)
Pt wants her husbands [facility] refills of Tramadol cancelled. Wife states they are going to [facility] and he is getting too much. She states she " is an Therapist, sports and they are addicting and he is getting too many. Pt wants to talk to assistant. [phone]

## 2014-02-01 ENCOUNTER — Telehealth: Payer: Self-pay | Admitting: Internal Medicine
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
## 2014-02-01 NOTE — Telephone Encounter (Signed)
Called the following pharmacies (again - see previous phone note). [facility] filled rx for Tramadol for pt.  [facility] - last filled on 11/30/13 (fax received) #180 with 1 refill. The refill has been cancelled.  [facility] - last filled on 10/12/13 #30 with 0 refills [facility] - last filled on 11/30/13 with 1 refill. Dropped off as a hard script.

## 2014-02-01 NOTE — Telephone Encounter (Signed)
Spoke to spouse and [name] stated that he was taking too many.  She admitted that she threw some away because she thought that he was given too many. She also thought that 180 was for a 30 supply. I explained that they were for a 90 day supply.
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
## 2014-02-19 DIAGNOSIS — N3946 Mixed incontinence: Secondary | ICD-10-CM | POA: Diagnosis not present

## 2014-02-19 DIAGNOSIS — R350 Frequency of micturition: Secondary | ICD-10-CM | POA: Diagnosis not present

## 2014-02-23 ENCOUNTER — Telehealth: Payer: Self-pay | Admitting: Internal Medicine

## 2014-02-23 NOTE — Telephone Encounter (Signed)
New Msg      Pt c/o BP issue:  1. What are your last 5 BP readings? 187/87, 200/95, last reading 182/87 this morning. 2. Are you having any other symptoms (ex. Dizziness, headache, blurred vision, passed out)? Headache in the back of head, light headedness 3. What is your medication issue? No medication issue ,just elevated BP   Pt wife calling and wants pt to be seen today, states he's had a stroke before.    Please call back.

## 2014-02-23 NOTE — Telephone Encounter (Signed)
F/U     Pt wife calling back for previous issue please give a call back.

## 2014-02-26 NOTE — Telephone Encounter (Signed)
Wife calling stating she had called Friday and no one called her back regarding her husband's BP readings.  BP has been "all over".  Thursday night they went to Fire station and was 210/80. During the day Thursday 187/87; 200/95; 182/87.  He was c/o headache, dizziness.  States Sat 2/12 at 1 AM was 196/64; she gave him an extra Catapres 0.1 mg. At 10 AM on 2/13 BP was 103/56. On 2/14 131/78; 178/124; (gave extra Catapres 0.1 mg).  On 2/15 190/75 and 148/78.  Is taking Norvasc 5 mg BID; Catapres 0.2 mg in AM and instead of taking Catapres 0.2 mg and 0.1 mg at HS he takes 0.3 mg at HS.  Today has had a slight HA and had some dizziness yesterday but not today.  Spoke w/Lori Gerhardt,NP who thinks too complicated to decipher over the phone and will make him an appointment to see Margaret Pyle (flex) tomorrow. Appointment made for 10:30 on 2/16.  Advised to bring meds, list of BP readings, and BP cuff.  She verbalizes understanding and will call to get referral from insurance company.

## 2014-02-26 NOTE — Telephone Encounter (Signed)
F/u   Pt calling concerning previous message from Friday 2.12.16. Please call pt.

## 2014-02-27 ENCOUNTER — Ambulatory Visit (INDEPENDENT_AMBULATORY_CARE_PROVIDER_SITE_OTHER): Payer: Commercial Managed Care - HMO | Admitting: Physician Assistant

## 2014-02-27 ENCOUNTER — Encounter: Payer: Self-pay | Admitting: Physician Assistant

## 2014-02-27 ENCOUNTER — Telehealth: Payer: Self-pay | Admitting: *Deleted

## 2014-02-27 VITALS — BP 136/68 | HR 68 | Ht 70.5 in | Wt 179.0 lb

## 2014-02-27 DIAGNOSIS — E785 Hyperlipidemia, unspecified: Secondary | ICD-10-CM

## 2014-02-27 DIAGNOSIS — I10 Essential (primary) hypertension: Secondary | ICD-10-CM

## 2014-02-27 DIAGNOSIS — I6523 Occlusion and stenosis of bilateral carotid arteries: Secondary | ICD-10-CM | POA: Diagnosis not present

## 2014-02-27 LAB — CBC WITH DIFFERENTIAL/PLATELET
BASOS PCT: 0.6 % (ref 0.0–3.0)
Basophils Absolute: 0 10*3/uL (ref 0.0–0.1)
Eosinophils Absolute: 0.2 10*3/uL (ref 0.0–0.7)
Eosinophils Relative: 3.1 % (ref 0.0–5.0)
HEMATOCRIT: 37.6 % — AB (ref 39.0–52.0)
HEMOGLOBIN: 12.7 g/dL — AB (ref 13.0–17.0)
LYMPHS ABS: 1.1 10*3/uL (ref 0.7–4.0)
LYMPHS PCT: 13.3 % (ref 12.0–46.0)
MCHC: 33.8 g/dL (ref 30.0–36.0)
MCV: 89.8 fl (ref 78.0–100.0)
MONOS PCT: 7.3 % (ref 3.0–12.0)
Monocytes Absolute: 0.6 10*3/uL (ref 0.1–1.0)
Neutro Abs: 6 10*3/uL (ref 1.4–7.7)
Neutrophils Relative %: 75.7 % (ref 43.0–77.0)
Platelets: 254 10*3/uL (ref 150.0–400.0)
RBC: 4.18 Mil/uL — ABNORMAL LOW (ref 4.22–5.81)
RDW: 14 % (ref 11.5–15.5)
WBC: 8 10*3/uL (ref 4.0–10.5)

## 2014-02-27 LAB — BASIC METABOLIC PANEL
BUN: 24 mg/dL — ABNORMAL HIGH (ref 6–23)
CO2: 28 meq/L (ref 19–32)
Calcium: 9.1 mg/dL (ref 8.4–10.5)
Chloride: 107 mEq/L (ref 96–112)
Creatinine, Ser: 1.04 mg/dL (ref 0.40–1.50)
GFR: 72.58 mL/min (ref >60.00)
Glucose, Bld: 130 mg/dL — ABNORMAL HIGH (ref 70–99)
Potassium: 5.1 mEq/L (ref 3.5–5.1)
Sodium: 138 mEq/L (ref 135–145)

## 2014-02-27 NOTE — Progress Notes (Signed)
Cardiology Office Note   Date:  02/27/2014   ID:  [name], DOB 10/07/1929, MRN [id number]  PCP:  Scott Grant, MD  Cardiologist:  Dr. [name]     Chief Complaint  Patient presents with  . Hypertension     History of Present Illness: [name] is a 81 y.o. male with a hx of difficult to control HTN, reactive airway disease, prior CVA, carotid stenosis s/p L CEA 2002, HL.  Last seen by Dr. [name] 5/15.  Wife called in yesterday with BP "all over."  BP ranging 103/56 >> 210/80.  He was added on today for further evaluation.  He is here with his wife.  She tells me that he had been sleeping until 12:00-1:00 in the afternoon until recently.  He takes his medications haphazardly.  He eats a "Honey Bun" every day for breakfast.  His wife says that he loves sweets and will not eat anything else.  He admits to eating out a lot.  He also chews tobacco.  He does not add any extra salt.  He had a HA a few weeks ago and decided to start checking his BP.  He brings in his list for me to review.  He has fairly optimal BP readings in the AM.  His BP increases quite a bit in the PM (up to 221/96).  His BP machine was checked today and it is registering appropriately.  He denies chest pain.  He notes DOE.  This is unchanged. He is NYHA 2-2b.  He denies orthopnea, PND.  He notes stable pedal edema.  Denies syncope. He has been lightheaded at times.  He was on Oxybutynin for a few weeks. He felt like this was increasing his BP and DC'd it 2 days ago.     Studies/Reports Reviewed Today:  Carotid US 03/28/13 Patent L CEA < 40% RICA  Renal Artery [place] 07/2011 No RAS No AAA  2-D Echocardiogram 10/15/09 Mod LVH EF 55-60%   Past Medical History  Diagnosis Date  . HTN (hypertension)     Negative renal duplex 07-22-11  . 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 Esophageal stricture     Distal, benign  . Diverticulosis   . GERD (gastroesophageal reflux disease)   .  Hemorrhoids   . Allergic rhinitis   . Prostate cancer 2000    Seed XRT  . Dyslipidemia   . Chronic pain syndrome     Left shoulder, back  . Carotid stenosis     a. s/p Left CEA 2002;  b. Carotid US 3/15:  patent L CEA, R < 40%  . Asthma   . Anemia   . Hx of echocardiogram     a. Echo 10/11: mod LVH, EF 55-60%    Past Surgical History  Procedure Laterality Date  . Cervical disc surgery  8/07  . Lumbar disc surgery    . Appendectomy    . Cataract extraction Bilateral   . Insertion prostate radiation seed  2000  . Carotid endarterectomy  2002    L cea  . Nasal sinus surgery      x 3  . Carotid endarterectomy Right 01/2010     Current Outpatient Prescriptions  Medication Sig Dispense Refill  . albuterol (PROVENTIL HFA;VENTOLIN HFA) 108 (90 BASE) MCG/ACT inhaler Inhale 1-2 puffs into the lungs every 6 (six) hours as needed for wheezing or shortness of breath.    [name] Kitchen amLODipine (NORVASC)  5 MG tablet Take 1 tablet (5 mg total) by mouth 2 (two) times daily. 180 tablet 1  . atorvastatin (LIPITOR) 40 MG tablet Take 40 mg by mouth every morning.    . budesonide-formoterol (SYMBICORT) 160-4.5 MCG/ACT inhaler Inhale 2 puffs into the lungs 2 (two) times daily. 1 Inhaler 0  . cetirizine (ZYRTEC) 10 MG tablet Take 10 mg by mouth daily.    . cloNIDine (CATAPRES) 0.1 MG tablet Take 1 tablet (0.1 mg total) by mouth daily at 10 pm. 90 tablet 2  . cloNIDine (CATAPRES) 0.2 MG tablet Take 1 tablet (0.2 mg total) by mouth 2 (two) times daily. 180 tablet 2  . clopidogrel (PLAVIX) 75 MG tablet Take 1 tablet (75 mg total) by mouth daily. 90 tablet 3  . docusate sodium (COLACE) 100 MG capsule Take 100 mg by mouth 2 (two) times daily.    [name] Kitchen gabapentin (NEURONTIN) 300 MG capsule TAKE 1 CAPSULE THREE TIMES A DAY 270 capsule 0  . magnesium oxide (MAG-OX) 400 MG tablet Take 400 mg by mouth 2 (two) times daily.    . methyldopa (ALDOMET) 500 MG tablet Take 500-1,000 mg by mouth 3 (three) times daily. 1000 mg in  the am 500 mg at midday and 1000 mg at bedtime    . montelukast (SINGULAIR) 10 MG tablet TAKE 1 TABLET EVERY DAY AT BEDTIME 90 tablet 0  . omeprazole (PRILOSEC) 40 MG capsule Take 1 capsule (40 mg total) by mouth daily. 90 capsule 3  . polyethylene glycol (MIRALAX / GLYCOLAX) packet Take 17 g by mouth as needed for mild constipation.     . senna-docusate (SENOKOT-S) 8.6-50 MG per tablet Take 1 tablet by mouth as needed for mild constipation.     . traMADol (ULTRAM) 50 MG tablet Take 1 tablet (50 mg total) by mouth 2 (two) times daily. 60 tablet 1  . triamcinolone cream (KENALOG) 0.5 % Apply 1 application topically as needed (for skin irritation).     . Wheat Dextrin (BENEFIBER PO) Take 1 scoop by mouth 2 (two) times daily. Powder- Mix with liquid    . [DISCONTINUED] valsartan (DIOVAN) 160 MG tablet Take 160 mg by mouth daily.       No current facility-administered medications for this visit.    Allergies:   Codeine; Diovan; Hctz; Oxycodone-acetaminophen; and Tramadol    Social History:  The patient  reports that he quit smoking about 36 years ago. His smoking use included Cigarettes. He has a 8.75 pack-year smoking history. His smokeless tobacco use includes Chew. He reports that he does not drink alcohol or use illicit drugs.   Family History:  The patient's family history includes Lung cancer in his brother; Stroke in his sister.    ROS:   Please see the history of present illness.    Review of Systems  Constitution: Negative for weight gain and weight loss.  Respiratory: Positive for wheezing. Negative for cough.   Musculoskeletal: Positive for back pain.  Gastrointestinal: Negative for hematochezia and melena.  Genitourinary: Positive for hematuria.       Followed by urology  Neurological: Positive for loss of balance.  All other systems reviewed and are negative.    PHYSICAL EXAM: VS:  BP 136/68 mmHg  Pulse 68  Ht 5' 10.5" (1.791 m)  Wt 179 lb (81.194 kg)  BMI 25.31 kg/m2   SpO2 98%    Wt Readings from Last 3 Encounters:  02/27/14 179 lb (81.194 kg)  11/30/13 183 lb 4 oz (83.122  kg)  10/20/13 178 lb 12.8 oz (81.103 kg)     GEN: Well nourished, well developed, in no acute distress HEENT: normal Neck: no JVD, no masses Cardiac:  Normal S1/S2, RRR; no murmur, no rubs or gallops, no edema  Respiratory:  clear to auscultation bilaterally, no wheezing, rhonchi or rales. GI: soft, nontender, nondistended, + BS MS: no deformity or atrophy Skin: warm and dry  Neuro:  CNs II-XII intact, Strength and sensation are intact Psych: Normal affect   EKG:  EKG is ordered today.  It demonstrates:   NSR, HR 68, LAD, freq PACs (bigeminal pattern), no significant changes since last tracing   Recent Labs: 11/30/2013: ALT 10; BUN 21; Creatinine 1.0; Hemoglobin 12.8*; Platelets 275.0; Potassium 4.8; Sodium 143; TSH 3.49    Lipid Panel    Component Value Date/Time   CHOL 103 11/30/2013 1121   TRIG 72.0 11/30/2013 1121   HDL 38.70* 11/30/2013 1121   CHOLHDL 3 11/30/2013 1121   VLDL 14.4 11/30/2013 1121   LDLCALC 50 11/30/2013 1121      ASSESSMENT AND PLAN:  1.  HYPERTENSION:   I had a long discussion with the patient today regarding his diet as well as the scheduling of his medications. I think by adjusting his diet to a low-sodium, heart healthy diet and improving the timing of his medications, we can improve his evening blood pressures.      -   Change clonidine and methyldopa to  8 AM and 8 PM.    -   I have asked him to set a timer so that he can remember to take his afternoon dose of methyldopa  (he often forgets this).    -   Change amlodipine to  5 mg at 10 AM and 10 PM.    -   Reduce the amount of "honey buns " that he is eating.    -   Low-sodium diet information given.    -   Call in the next 1-2 weeks with an update on his evening blood pressures. If they remain elevated, I would suggest changing his Norvasc to 10 mg once a day later in the morning. 2.   HLD (hyperlipidemia):  Recent LDL optimal.  Continue Lipitor.  3.  Carotid stenosis, bilateral:  FU with VVS as planned.     Current medicines are reviewed at length with the patient today.  The patient has concerns regarding medicines.  The following changes have been made:  As above.   Labs/ tests ordered today include:  Orders Placed This Encounter  Procedures  . Basic Metabolic Panel (BMET)  . CBC w/Diff  . EKG 12-Lead    Disposition:   FU with Dr. [name] in 3 weeks as already planned.    Signed, Versie Starks, MHS 02/27/2014 11:07 AM    [name] [place], [place],   [address] Phone: [phone]; Fax: [phone]

## 2014-02-27 NOTE — Telephone Encounter (Signed)
DPR on file ok to s/w pt's wife. Wife notified of lab results, she said pt had just walked into [employer] however; she will let him know the results and to f/u w/PCP about glucose 130.

## 2014-02-27 NOTE — Assessment & Plan Note (Deleted)
abcd

## 2014-02-27 NOTE — Patient Instructions (Addendum)
LAB WORK TODAY; BMET, CBC W/DIFF  KEEP YOUR APPT WITH DR. ROSS 03/2014  CHANGE TAKING THESE MEDICATIONS BELOW AT THE NEW TIMES: 1. TAKE AT 8 AM, CLONIDINE, ALDOMET 2. TAKE BETWEEN 10-11 AM NORVASC 5 MG 3. TAKE BETWEEN 12-1 PM ALDOMET 4. TAKE AT 8 PM CLONIDINE, ALDOMET 5. TAKE AT BETWEEN 10-11 PM NORVASC 5 MG   CALL IN THE NEXT 1-2 WEEKS WITH BP READINGS [phone]  Low-Sodium Eating Plan Sodium raises blood pressure and causes water to be held in the body. Getting less sodium from food will help lower your blood pressure, reduce any swelling, and protect your heart, liver, and kidneys. We get sodium by adding salt (sodium chloride) to food. Most of our sodium comes from canned, boxed, and frozen foods. Restaurant foods, fast foods, and pizza are also very high in sodium. Even if you take medicine to lower your blood pressure or to reduce fluid in your body, getting less sodium from your food is important. WHAT IS MY PLAN? Most people should limit their sodium intake to 2,300 mg a day. Your health care provider recommends that you limit your sodium intake to __________ a day.  WHAT DO I NEED TO KNOW ABOUT THIS EATING PLAN? For the low-sodium eating plan, you will follow these general guidelines:  Choose foods with a % Daily Value for sodium of less than 5% (as listed on the food label).   Use salt-free seasonings or herbs instead of table salt or sea salt.   Check with your health care provider or pharmacist before using salt substitutes.   Eat fresh foods.  Eat more vegetables and fruits.  Limit canned vegetables. If you do use them, rinse them well to decrease the sodium.   Limit cheese to 1 oz (28 g) per day.   Eat lower-sodium products, often labeled as "lower sodium" or "no salt added."  Avoid foods that contain monosodium glutamate (MSG). MSG is sometimes added to [place] food and some canned foods.  Check food labels (Nutrition Facts labels) on foods to learn how much  sodium is in one serving.  Eat more home-cooked food and less restaurant, buffet, and fast food.  When eating at a restaurant, ask that your food be prepared with less salt or none, if possible.  HOW DO I READ FOOD LABELS FOR SODIUM INFORMATION? The Nutrition Facts label lists the amount of sodium in one serving of the food. If you eat more than one serving, you must multiply the listed amount of sodium by the number of servings. Food labels may also identify foods as:  Sodium free--Less than 5 mg in a serving.  Very low sodium--35 mg or less in a serving.  Low sodium--140 mg or less in a serving.  Light in sodium--50% less sodium in a serving. For example, if a food that usually has 300 mg of sodium is changed to become light in sodium, it will have 150 mg of sodium.  Reduced sodium--25% less sodium in a serving. For example, if a food that usually has 400 mg of sodium is changed to reduced sodium, it will have 300 mg of sodium. WHAT FOODS CAN I EAT? Grains Low-sodium cereals, including oats, puffed wheat and rice, and shredded wheat cereals. Low-sodium crackers. Unsalted rice and pasta. Lower-sodium bread.  Vegetables Frozen or fresh vegetables. Low-sodium or reduced-sodium canned vegetables. Low-sodium or reduced-sodium tomato sauce and paste. Low-sodium or reduced-sodium tomato and vegetable juices.  Fruits Fresh, frozen, and canned fruit. Fruit juice.  Meat  and Other Protein Products Low-sodium canned tuna and salmon. Fresh or frozen meat, poultry, seafood, and fish. Lamb. Unsalted nuts. Dried beans, peas, and lentils without added salt. Unsalted canned beans. Homemade soups without salt. Eggs.  Dairy Milk. Soy milk. Ricotta cheese. Low-sodium or reduced-sodium cheeses. Yogurt.  Condiments Fresh and dried herbs and spices. Salt-free seasonings. Onion and garlic powders. Low-sodium varieties of mustard and ketchup. Lemon juice.  Fats and Oils Reduced-sodium salad  dressings. Unsalted butter.  Other Unsalted popcorn and pretzels.  The items listed above may not be a complete list of recommended foods or beverages. Contact your dietitian for more options. WHAT FOODS ARE NOT RECOMMENDED? Grains Instant hot cereals. Bread stuffing, pancake, and biscuit mixes. Croutons. Seasoned rice or pasta mixes. Noodle soup cups. Boxed or frozen macaroni and cheese. Self-rising flour. Regular salted crackers. Vegetables Regular canned vegetables. Regular canned tomato sauce and paste. Regular tomato and vegetable juices. Frozen vegetables in sauces. Salted french fries. Olives. Angie Fava. Relishes. Sauerkraut. Salsa. Meat and Other Protein Products Salted, canned, smoked, spiced, or pickled meats, seafood, or fish. Bacon, ham, sausage, hot dogs, corned beef, chipped beef, and packaged luncheon meats. Salt pork. Jerky. Pickled herring. Anchovies, regular canned tuna, and sardines. Salted nuts. Dairy Processed cheese and cheese spreads. Cheese curds. Blue cheese and cottage cheese. Buttermilk.  Condiments Onion and garlic salt, seasoned salt, table salt, and sea salt. Canned and packaged gravies. Worcestershire sauce. Tartar sauce. Barbecue sauce. Teriyaki sauce. Soy sauce, including reduced sodium. Steak sauce. Fish sauce. Oyster sauce. Cocktail sauce. Horseradish. Regular ketchup and mustard. Meat flavorings and tenderizers. Bouillon cubes. Hot sauce. Tabasco sauce. Marinades. Taco seasonings. Relishes. Fats and Oils Regular salad dressings. Salted butter. Margarine. Ghee. Bacon fat.  Other Potato and tortilla chips. Corn chips and puffs. Salted popcorn and pretzels. Canned or dried soups. Pizza. Frozen entrees and pot pies.  The items listed above may not be a complete list of foods and beverages to avoid. Contact your dietitian for more information. Document Released: 06/20/2001 Document Revised: 01/03/2013 Document Reviewed: 11/02/2012 [facility] Patient  Information 2015 [place], [place]. This information is not intended to replace advice given to you by your health care provider. Make sure you discuss any questions you have with your health care provider.

## 2014-03-09 ENCOUNTER — Other Ambulatory Visit: Payer: Self-pay | Admitting: Pulmonary Disease

## 2014-03-13 ENCOUNTER — Encounter: Payer: Self-pay | Admitting: Internal Medicine

## 2014-03-15 ENCOUNTER — Ambulatory Visit (INDEPENDENT_AMBULATORY_CARE_PROVIDER_SITE_OTHER): Payer: Commercial Managed Care - HMO | Admitting: Internal Medicine

## 2014-03-15 ENCOUNTER — Encounter: Payer: Self-pay | Admitting: Internal Medicine

## 2014-03-15 VITALS — BP 166/58 | HR 60 | Ht 71.0 in | Wt 181.4 lb

## 2014-03-15 DIAGNOSIS — E785 Hyperlipidemia, unspecified: Secondary | ICD-10-CM

## 2014-03-15 DIAGNOSIS — I10 Essential (primary) hypertension: Secondary | ICD-10-CM

## 2014-03-15 MED ORDER — ALBUTEROL SULFATE HFA 108 (90 BASE) MCG/ACT IN AERS: 1.0000 | INHALATION_SPRAY | Freq: Four times a day (QID) | RESPIRATORY_TRACT | Status: DC | PRN

## 2014-03-15 NOTE — Progress Notes (Signed)
Cardiology Office Note   Date:  03/15/2014   ID:  [name], DOB 1929/09/30, MRN [id number]  PCP:  Gwendolyn Grant, MD  Cardiologist:   [name], MD   Patient comes in for continued care of his HT   History of Present Illness: [name] is a 81 y.o. male with a history of difficult to control HTN, reactive airway disease, prior CVA, carotid stenosis s/p L CEA 2002, HL.  He was last seen by Kathleen Argue.  At that time his BP medications were adjusted  Since the his wife says he can be erratic in taking his BP meds  Often forgets to take his midday methyldopa Feeling OK  No CP   Bring in list of BPs from home  Review shows they are very variable.  130s to 205/  But, their cuff on testing today does read 20 points higher than office cuff  Denies CP  Still with occasional wheezing.       Current Outpatient Prescriptions  Medication Sig Dispense Refill  . albuterol (PROVENTIL HFA;VENTOLIN HFA) 108 (90 BASE) MCG/ACT inhaler Inhale 1-2 puffs into the lungs every 6 (six) hours as needed for wheezing or shortness of breath.    [name] Kitchen amLODipine (NORVASC) 5 MG tablet Take 1 tablet (5 mg total) by mouth 2 (two) times daily. 180 tablet 1  . atorvastatin (LIPITOR) 40 MG tablet Take 40 mg by mouth every morning.    . budesonide-formoterol (SYMBICORT) 160-4.5 MCG/ACT inhaler Inhale 2 puffs into the lungs 2 (two) times daily. 1 Inhaler 0  . cetirizine (ZYRTEC) 10 MG tablet Take 10 mg by mouth daily.    . cloNIDine (CATAPRES) 0.1 MG tablet Take 1 tablet (0.1 mg total) by mouth daily at 10 pm. 90 tablet 2  . cloNIDine (CATAPRES) 0.2 MG tablet Take 1 tablet (0.2 mg total) by mouth 2 (two) times daily. (Patient taking differently: Take 0.2 mg by mouth 2 (two) times daily. Take .2 mg in the morning and .3 mg at night.) 180 tablet 2  . clopidogrel (PLAVIX) 75 MG tablet Take 1 tablet (75 mg total) by mouth daily. 90 tablet 3  . docusate sodium (COLACE) 100 MG capsule Take 100 mg by mouth 2 (two) times  daily.    [name] Kitchen gabapentin (NEURONTIN) 300 MG capsule TAKE 1 CAPSULE THREE TIMES A DAY (Patient taking differently: TAKE 1 CAPSULE BY MOUTH THREE TIMES A DAY) 270 capsule 0  . magnesium oxide (MAG-OX) 400 MG tablet Take 400 mg by mouth 2 (two) times daily.    . methyldopa (ALDOMET) 500 MG tablet Take 500-1,000 mg by mouth 3 (three) times daily. 1000 mg in the am 500 mg at midday and 1000 mg at bedtime    . montelukast (SINGULAIR) 10 MG tablet TAKE 1 TABLET EVERY DAY AT BEDTIME 90 tablet 0  . omeprazole (PRILOSEC) 40 MG capsule Take 1 capsule (40 mg total) by mouth daily. 90 capsule 3  . polyethylene glycol (MIRALAX / GLYCOLAX) packet Take 17 g by mouth as needed for mild constipation.     . senna-docusate (SENOKOT-S) 8.6-50 MG per tablet Take 1 tablet by mouth daily as needed for mild constipation.     . triamcinolone cream (KENALOG) 0.5 % Apply 1 application topically as needed (for skin irritation).     . Wheat Dextrin (BENEFIBER PO) Take 1 scoop by mouth 2 (two) times daily. Powder- Mix with liquid    . [DISCONTINUED] valsartan (DIOVAN) 160 MG tablet Take 160 mg  by mouth daily.       No current facility-administered medications for this visit.    Allergies:   Oxycodone-acetaminophen; Codeine; Diovan; Hctz; Oxycodone-acetaminophen; and Tramadol   Past Medical History  Diagnosis Date  . HTN (hypertension)     Negative renal duplex 07-22-11  . 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 Esophageal stricture     Distal, benign  . Diverticulosis   . GERD (gastroesophageal reflux disease)   . Hemorrhoids   . Allergic rhinitis   . Prostate cancer 2000    Seed XRT  . Dyslipidemia   . Chronic pain syndrome     Left shoulder, back  . Carotid stenosis     a. s/p Left CEA 2002;  b. Carotid US 3/15:  patent L CEA, R < 40%  . Asthma   . Anemia   . Hx of echocardiogram     a. Echo 10/11: mod LVH, EF 55-60%    Past Surgical History  Procedure Laterality Date  . Cervical disc  surgery  8/07  . Lumbar disc surgery    . Appendectomy    . Cataract extraction Bilateral   . Insertion prostate radiation seed  2000  . Carotid endarterectomy  2002    L cea  . Nasal sinus surgery      x 3  . Carotid endarterectomy Right 01/2010     Social History:  The patient  reports that he quit smoking about 36 years ago. His smoking use included Cigarettes. He has a 8.75 pack-year smoking history. His smokeless tobacco use includes Chew. He reports that he does not drink alcohol or use illicit drugs.   Family History:  The patient's family history includes Lung cancer in his brother; Stroke in his sister.    ROS:  Please see the history of present illness. All other systems are reviewed and  Negative to the above problem except as noted.    PHYSICAL EXAM: VS:  BP 166/58 mmHg  Pulse 60  Ht 5\' 11"  (1.803 m)  Wt 181 lb 6.4 oz (82.283 kg)  BMI 25.31 kg/m2  GEN: Well nourished, well developed, in no acute distress HEENT: normal Neck: no JVD, carotid bruits, or masses Cardiac: RRR; no murmurs, rubs, or gallops,no edema  Respiratory:  clear to auscultation bilaterally, normal work of breathing GI: soft, nontender, nondistended, + BS  No hepatomegaly  MS: no deformity Moving all extremities   Skin: warm and dry, no rash Neuro:  Strength and sensation are intact Psych: euthymic mood, full affect   EKG:  EKG is not ordered today.   Lipid Panel    Component Value Date/Time   CHOL 103 11/30/2013 1121   TRIG 72.0 11/30/2013 1121   HDL 38.70* 11/30/2013 1121   CHOLHDL 3 11/30/2013 1121   VLDL 14.4 11/30/2013 1121   LDLCALC 50 11/30/2013 1121      Wt Readings from Last 3 Encounters:  03/15/14 181 lb 6.4 oz (82.283 kg)  02/27/14 179 lb (81.194 kg)  11/30/13 183 lb 4 oz (83.122 kg)      ASSESSMENT AND PLAN: 1.  HTN  BP is very labile  BUt, in clinic visits prior it has not been that high as home BP (cuff may be off)  I am reluctant to push higher does of meds as he  may get too low Keep on same regimen  2.  PUlm  Rx given for albuterol inhaler.  Continue other meds .  3.  HL  Keep on lipitor     Current medicines are reviewed at length with the patient today.  The patient does not have concerns regarding medicines.  The following changes have been made:   Labs/ tests ordered today include: No orders of the defined types were placed in this encounter.     Disposition:   FU with  in   Signed, [name], MD  03/15/2014 2:53 PM    [address], [place], [place]  [address] Phone: [phone]; Fax: [phone]

## 2014-03-15 NOTE — Patient Instructions (Signed)
Your physician recommends that you continue on your current medications as directed. Please refer to the Current Medication list given to you today. Your physician wants you to follow-up in: 6 months with Dr. Ross.  You will receive a reminder letter in the mail two months in advance. If you don't receive a letter, please call our office to schedule the follow-up appointment.  

## 2014-04-02 ENCOUNTER — Encounter: Payer: Self-pay | Admitting: Vascular Surgery

## 2014-04-03 ENCOUNTER — Ambulatory Visit (HOSPITAL_COMMUNITY)
Admission: RE | Admit: 2014-04-03 | Discharge: 2014-04-03 | Disposition: A | Discharge status: Home / Self Care | Payer: Commercial Managed Care - HMO | Source: Ambulatory Visit | Attending: Vascular Surgery | Admitting: Vascular Surgery

## 2014-04-03 ENCOUNTER — Ambulatory Visit (INDEPENDENT_AMBULATORY_CARE_PROVIDER_SITE_OTHER): Payer: Commercial Managed Care - HMO | Admitting: Vascular Surgery

## 2014-04-03 ENCOUNTER — Encounter: Payer: Self-pay | Admitting: Vascular Surgery

## 2014-04-03 VITALS — BP 127/57 | HR 49 | Resp 18 | Ht 68.5 in | Wt 182.5 lb

## 2014-04-03 DIAGNOSIS — I6523 Occlusion and stenosis of bilateral carotid arteries: Secondary | ICD-10-CM | POA: Diagnosis not present

## 2014-04-03 DIAGNOSIS — I6521 Occlusion and stenosis of right carotid artery: Secondary | ICD-10-CM | POA: Insufficient documentation

## 2014-04-03 NOTE — Progress Notes (Signed)
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 He underwent prior right carotid endarterectomy by myself in June 2012. He is here today for duplex follow-up. He has had no focal neurologic deficits. He is here today with his wife. He does report frequent episodes where with exertion he feels as though he is losing his breath and has to stop for approximately at this could either be cardiac or pulmonary. He saw Dr. Harrington Challenger for cardiac evaluation 1-2 weeks ago but failed to mention this time. He denies any chest pain.  Past Medical History  Diagnosis Date  . HTN (hypertension)     Negative renal duplex 07-22-11  . 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 Esophageal stricture     Distal, benign  . Diverticulosis   . GERD (gastroesophageal reflux disease)   . Hemorrhoids   . Allergic rhinitis   . Prostate cancer 2000    Seed XRT  . Dyslipidemia   . Chronic pain syndrome     Left shoulder, back  . Carotid stenosis     a. s/p Left CEA 2002;  b. Carotid US 3/15:  patent L CEA, R < 40%  . Asthma   . Anemia   . Hx of echocardiogram     a. Echo 10/11: mod LVH, EF 55-60%  . Pneumonia 07-2013    History  Substance Use Topics  . Smoking status: Former Smoker -- 0.25 packs/day for 35 years    Types: Cigarettes    Quit date: 01/12/1978  . Smokeless tobacco: Current User    Types: Chew  . Alcohol Use: No     Comment: Prior alcoholic, sober since 6160    Family History  Problem Relation Age of Onset  . Lung cancer Brother   . Stroke Sister     Allergies  Allergen Reactions  . Oxycodone-Acetaminophen Nausea And Vomiting  . Codeine Nausea Only  . Diovan [Valsartan]     hyperkalemia  . Hctz [Hydrochlorothiazide]     Swelling and dyspnea   . Oxycodone-Acetaminophen Nausea Only  . Tramadol     confussion     Current outpatient prescriptions:  .  albuterol (PROVENTIL HFA;VENTOLIN HFA) 108 (90 BASE) MCG/ACT inhaler, Inhale 1-2 puffs into  the lungs every 6 (six) hours as needed for wheezing or shortness of breath., Disp: 1 Inhaler, Rfl: 6 .  amLODipine (NORVASC) 5 MG tablet, Take 1 tablet (5 mg total) by mouth 2 (two) times daily., Disp: 180 tablet, Rfl: 1 .  atorvastatin (LIPITOR) 40 MG tablet, Take 40 mg by mouth every morning., Disp: , Rfl:  .  budesonide-formoterol (SYMBICORT) 160-4.5 MCG/ACT inhaler, Inhale 2 puffs into the lungs 2 (two) times daily., Disp: 1 Inhaler, Rfl: 0 .  cetirizine (ZYRTEC) 10 MG tablet, Take 10 mg by mouth daily., Disp: , Rfl:  .  cloNIDine (CATAPRES) 0.2 MG tablet, Take 1 tablet (0.2 mg total) by mouth 2 (two) times daily. (Patient taking differently: Take 0.3 mg by mouth daily. Take .2 mg in the morning and .3 mg at night.), Disp: 180 tablet, Rfl: 2 .  clopidogrel (PLAVIX) 75 MG tablet, Take 1 tablet (75 mg total) by mouth daily., Disp: 90 tablet, Rfl: 3 .  docusate sodium (COLACE) 100 MG capsule, Take 100 mg by mouth 2 (two) times daily., Disp: , Rfl:  .  gabapentin (NEURONTIN) 300 MG capsule, TAKE 1 CAPSULE THREE TIMES A DAY (Patient taking differently: TAKE 1 CAPSULE BY MOUTH THREE TIMES  A DAY), Disp: 270 capsule, Rfl: 0 .  magnesium oxide (MAG-OX) 400 MG tablet, Take 400 mg by mouth 2 (two) times daily., Disp: , Rfl:  .  methyldopa (ALDOMET) 500 MG tablet, Take 500-1,000 mg by mouth 3 (three) times daily. 1000 mg in the am 500 mg at midday and 1000 mg at bedtime, Disp: , Rfl:  .  montelukast (SINGULAIR) 10 MG tablet, TAKE 1 TABLET EVERY DAY AT BEDTIME, Disp: 90 tablet, Rfl: 0 .  omeprazole (PRILOSEC) 40 MG capsule, Take 1 capsule (40 mg total) by mouth daily., Disp: 90 capsule, Rfl: 3 .  polyethylene glycol (MIRALAX / GLYCOLAX) packet, Take 17 g by mouth as needed for mild constipation. , Disp: , Rfl:  .  senna-docusate (SENOKOT-S) 8.6-50 MG per tablet, Take 1 tablet by mouth daily as needed for mild constipation. , Disp: , Rfl:  .  triamcinolone cream (KENALOG) 0.5 %, Apply 1 application topically  as needed (for skin irritation). , Disp: , Rfl:  .  Wheat Dextrin (BENEFIBER PO), Take 1 scoop by mouth 2 (two) times daily. Powder- Mix with liquid, Disp: , Rfl:  .  [DISCONTINUED] valsartan (DIOVAN) 160 MG tablet, Take 160 mg by mouth daily.  , Disp: , Rfl:   Filed Vitals:   04/03/14 1601  BP: 127/57  Pulse: 49  Resp: 18  Height: 5' 8.5" (1.74 m)  Weight: 182 lb 8 oz (82.781 kg)    Body mass index is 27.34 kg/(m^2).       Physical exam is well-developed well-nourished gentleman in no acute distress Is radial pulses are 2+ bilaterally Respirations are equal in nonlabored Carotid arteries are without bruits bilaterally He has a well-healed right neck incision. Skin otherwise without rashes or ulcers  Carotid duplex today was reviewed with the patient. This shows widely patient patent endarterectomy with no evidence of recurrent internal carotid artery stenosis on the right. Left carotid shows no significant stenosis  Impression and plan: Stable follow-up of right carotid endarterectomy from 2012. We'll continue his usual activities. If his pulmonary issues increase he will contact Dr. Harrington Challenger. We will see him again in one year

## 2014-04-04 NOTE — Addendum Note (Signed)
Addended by: Mena Goes on: 04/04/2014 02:54 PM   Modules accepted: Orders

## 2014-04-09 ENCOUNTER — Telehealth: Payer: Self-pay | Admitting: Pulmonary Disease

## 2014-04-09 NOTE — Telephone Encounter (Signed)
Patient needs follow up appointment with TP.  Tried to call x 2 and could not leave message, N/A.  Will try to call back later.

## 2014-04-11 NOTE — Telephone Encounter (Signed)
LMTCB on home phone  Called cell and spoke with the pt He states that he does not have time to schedule appt and will have his spouse call back to get this set up

## 2014-04-13 ENCOUNTER — Other Ambulatory Visit: Payer: Self-pay | Admitting: Internal Medicine

## 2014-04-13 NOTE — Telephone Encounter (Signed)
[facility] called in [phone])  Needs new script for Clonidine sent and on it , it needs to say pt takes 0.2 as well as 0.3.  They need a new script just for the 0.3 mg right not.     Doc call line if there is any question about this [phone]

## 2014-04-18 NOTE — Telephone Encounter (Signed)
Ok I have changed sig to reflect same as rx'd - please send/clarify thanks

## 2014-04-19 MED ORDER — CLONIDINE HCL 0.2 MG PO TABS
0.2000 mg | ORAL_TABLET | Freq: Every day | ORAL | Status: DC
Start: 2014-04-19 — End: 2014-05-15

## 2014-04-22 ENCOUNTER — Other Ambulatory Visit: Payer: Self-pay | Admitting: Internal Medicine

## 2014-04-23 ENCOUNTER — Ambulatory Visit: Payer: Commercial Managed Care - HMO | Admitting: Internal Medicine

## 2014-04-23 ENCOUNTER — Ambulatory Visit: Payer: Commercial Managed Care - HMO | Admitting: Adult Health

## 2014-04-27 ENCOUNTER — Ambulatory Visit (INDEPENDENT_AMBULATORY_CARE_PROVIDER_SITE_OTHER): Payer: Commercial Managed Care - HMO | Admitting: Internal Medicine

## 2014-04-27 ENCOUNTER — Encounter: Payer: Self-pay | Admitting: Internal Medicine

## 2014-04-27 ENCOUNTER — Telehealth: Payer: Self-pay | Admitting: Geriatric Medicine

## 2014-04-27 VITALS — BP 102/52 | HR 66 | Temp 97.7°F | Resp 14 | Wt 179.3 lb

## 2014-04-27 DIAGNOSIS — G8929 Other chronic pain: Secondary | ICD-10-CM

## 2014-04-27 DIAGNOSIS — M545 Low back pain: Secondary | ICD-10-CM

## 2014-04-27 DIAGNOSIS — J3089 Other allergic rhinitis: Secondary | ICD-10-CM

## 2014-04-27 MED ORDER — FLUTICASONE PROPIONATE 50 MCG/ACT NA SUSP: 2.0000 | Freq: Every day | NASAL | Status: DC

## 2014-04-27 MED ORDER — MELOXICAM 15 MG PO TABS
15.0000 mg | ORAL_TABLET | Freq: Every day | ORAL | Status: DC
Start: 2014-04-27 — End: 2014-10-18

## 2014-04-27 NOTE — Progress Notes (Signed)
   Subjective:    Patient ID: [name], male    DOB: [birth year], 81 y.o.   MRN: [id number]  HPI The patient is an 81 YO man who is here for nasal drainage and sore throat. He was doing outdoor work about 2-3 days ago and has had increased nasal drainage and sore throat since then. Denies SOB or cough. Denies chest pains. Denies fevers or chills or sinus pressure or ear pain. Clear nasal discharge. Has tried affrin with some success. Takes montelukast and zyrtec. Used to take flonase but stopped for some unknown reason.   Review of Systems  Constitutional: Negative for fever, chills, appetite change and fatigue.  HENT: Positive for congestion, postnasal drip and sore throat. Negative for ear pain, nosebleeds, sinus pressure and trouble swallowing.   Respiratory: Negative for cough, chest tightness, shortness of breath and wheezing.   Cardiovascular: Negative for chest pain, palpitations and leg swelling.  Musculoskeletal: Positive for back pain.  Psychiatric/Behavioral: Negative.       Objective:   Physical Exam  Constitutional: He appears well-developed and well-nourished.  HENT:  Head: Normocephalic and atraumatic.  Right Ear: External ear normal.  Left Ear: External ear normal.  Nose with some erythema, throat with mild clear drainage no erythema or purulent discharge.   Neck: Normal range of motion.  Cardiovascular: Normal rate.   Pulmonary/Chest: Effort normal and breath sounds normal.  Abdominal: Soft.   Filed Vitals:   04/27/14 1355  BP: 102/52  Pulse: 66  Temp: 97.7 F (36.5 C)  TempSrc: Oral  Resp: 14  Weight: 179 lb 4.8 oz (81.33 kg)  SpO2: 97%      Assessment & Plan:

## 2014-04-27 NOTE — Patient Instructions (Signed)
We have put in a referral to Dr. Nelva Bush to see if that will help speed things up.   We have also sent in flonase which is a nose spray that will help with the drainage. Take 2 sprays in each nostril once a day until you feel better. Then start using it again if you will be doing anything outdoors.

## 2014-04-27 NOTE — Assessment & Plan Note (Addendum)
Advised to stop affrin and start using flonase (rx sent in today). Can continue with montelukast and zyrtec as needed. Advised to wear a mask when doing yard ward with potential exposure to particles in the air.

## 2014-04-27 NOTE — Addendum Note (Signed)
Addended by: Vertell Novak A on: 04/27/2014 04:53 PM   Modules accepted: Orders

## 2014-04-27 NOTE — Telephone Encounter (Signed)
Patient wants to know if you can give him Tramadol two daily until he can get in with Dr. Nelva Bush? Tramadol is listed as an allergy, but he says he is not allergic to it.

## 2014-04-27 NOTE — Progress Notes (Signed)
Pre visit review using our clinic review tool, if applicable. No additional management support is needed unless otherwise documented below in the visit note. 

## 2014-04-27 NOTE — Telephone Encounter (Signed)
He can try meloxicam for the pain. Sent to his pharmacy.

## 2014-04-27 NOTE — Telephone Encounter (Signed)
Patient aware and will go pick up

## 2014-04-27 NOTE — Assessment & Plan Note (Signed)
Referral placed for orthopedics to Dr. Nelva Bush for back pain.

## 2014-05-03 DIAGNOSIS — M961 Postlaminectomy syndrome, not elsewhere classified: Secondary | ICD-10-CM | POA: Diagnosis not present

## 2014-05-03 DIAGNOSIS — M47816 Spondylosis without myelopathy or radiculopathy, lumbar region: Secondary | ICD-10-CM | POA: Diagnosis not present

## 2014-05-03 DIAGNOSIS — M545 Low back pain: Secondary | ICD-10-CM | POA: Diagnosis not present

## 2014-05-09 ENCOUNTER — Telehealth: Payer: Self-pay | Admitting: Internal Medicine

## 2014-05-09 NOTE — Telephone Encounter (Signed)
Routing to Dr. Harrington Challenger for surgical clearance request - re: Plavix.

## 2014-05-09 NOTE — Telephone Encounter (Signed)
Request for surgical clearance:  1. What type of surgery is being performed? Lumbar epidural steroid injection   2. When is this surgery scheduled? 05/30/2014  3. Are there any medications that need to be held prior to surgery and how long?Plavix     4. Name of physician performing surgery? Dr. Nelva Bush    5. What is your office phone and fax number? Phone: [phone] Fax # [phone]  6. Comments

## 2014-05-10 NOTE — Telephone Encounter (Signed)
Routing this back to the nurse of Dr. Harrington Challenger to assist

## 2014-05-10 NOTE — Telephone Encounter (Signed)
OK to stop plavix prior.  Hold 5 days prior Placed on this by neuro for hx of CV disease  patinet should be  On ASA and Plavix as directed by neuro not cardiology

## 2014-05-10 NOTE — Telephone Encounter (Signed)
Patient is not seeing anyone from neuro.  He said he just sees Dr. Harrington Challenger and primary care.  Pt takes ASA 325 mg daily also.  Added to medication list.  Will route back to Dr. Harrington Challenger to clarify what to hold and how long.  Also patient reports his HR has been slower than normal.  Usually 60s, lately its been 50s and today 49.  It is the middle of the day and he is feeling sluggish with it.  Patient is aware that I am forwarding to Dr. Harrington Challenger and will call him back with further specifics.

## 2014-05-13 NOTE — Telephone Encounter (Signed)
Doesn't need to hold ASA Re bradycardia  Clonidine can slow heart rate.   Would take 0.1 mg 2x per day at maimal dose

## 2014-05-15 ENCOUNTER — Telehealth: Payer: Self-pay | Admitting: *Deleted

## 2014-05-15 MED ORDER — CLONIDINE HCL 0.1 MG PO TABS: 0.1000 mg | ORAL_TABLET | Freq: Two times a day (BID) | ORAL | Status: DC

## 2014-05-15 NOTE — Telephone Encounter (Signed)
Called [name] at [facility] to inform. -faxed note per her request for holding plavix 5 days prior.  Called patient to inform of clonidine change.  He verbalizes understanding and agreement.  Does not need new script to pharmacy at this time.

## 2014-05-15 NOTE — Telephone Encounter (Signed)
Phone call from patient's wife. She wanted to clarify dose of clonidine he is to change to. Advised Dr. Harrington Challenger rec clonidine 0.1 mg bid due to his heart rate begin slower.  She verbalizes understanding.  States his BP is high 140s-160s over 60s-80s.  Sometimes he takes pressure after he's been active. Advised to monitor BP once daly after resting for few minutes, around the same time each day and call with BP response.  They eat out a lot, discussed why it is important to try to reduce sodium in foods.

## 2014-05-22 ENCOUNTER — Telehealth: Payer: Self-pay | Admitting: Internal Medicine

## 2014-05-22 NOTE — Telephone Encounter (Signed)
Dr. Katy Apo Josem Kaufmann #[id number] valid 05/22/14-11/18/14 for 6 visits Dr. Kara Mead Josem Kaufmann #[id number] valid 06/12/14-12/09/14 for 6 visits Referral to Dr. Nelva Bush already completed on 05/04/14 Dr. Harriett Sine Josem Kaufmann #[id number] valid 05/22/14-11/18/14 for 6 visits

## 2014-05-25 ENCOUNTER — Emergency Department (INDEPENDENT_AMBULATORY_CARE_PROVIDER_SITE_OTHER)
Admission: EM | Admit: 2014-05-25 | Discharge: 2014-05-25 | Disposition: A | Discharge status: Home / Self Care | Payer: Commercial Managed Care - HMO | Source: Home / Self Care | Attending: Family Medicine | Admitting: Family Medicine

## 2014-05-25 ENCOUNTER — Encounter (HOSPITAL_COMMUNITY): Payer: Self-pay | Admitting: Emergency Medicine

## 2014-05-25 DIAGNOSIS — N390 Urinary tract infection, site not specified: Secondary | ICD-10-CM | POA: Diagnosis not present

## 2014-05-25 LAB — POCT URINALYSIS DIP (DEVICE)
Bilirubin Urine: NEGATIVE
GLUCOSE, UA: 100 mg/dL — AB
HGB URINE DIPSTICK: NEGATIVE
KETONES UR: NEGATIVE mg/dL
NITRITE: POSITIVE — AB
Protein, ur: 30 mg/dL — AB
Specific Gravity, Urine: <=1.005 (ref 1.005–1.030)
UROBILINOGEN UA: 1 mg/dL (ref 0.0–1.0)
pH: 5 (ref 5.0–8.0)

## 2014-05-25 MED ORDER — CEFTRIAXONE SODIUM 1 G IJ SOLR
1.0000 g | Freq: Once | INTRAMUSCULAR | Status: AC
  Administered 2014-05-25: 1 g via INTRAMUSCULAR

## 2014-05-25 MED ORDER — LIDOCAINE HCL (PF) 1 % IJ SOLN
INTRAMUSCULAR | Status: AC
  Filled 2014-05-25: qty 5

## 2014-05-25 MED ORDER — CEFTRIAXONE SODIUM 1 G IJ SOLR
INTRAMUSCULAR | Status: AC
  Filled 2014-05-25: qty 10

## 2014-05-25 MED ORDER — CEPHALEXIN 500 MG PO CAPS: 500.0000 mg | ORAL_CAPSULE | Freq: Four times a day (QID) | ORAL | Status: DC

## 2014-05-25 NOTE — ED Notes (Signed)
C/o UTI sx onset 5/11 Sx include dysuria, hematuria, abd pain, fevers, chills Denies n/v Alert, no signs of acute distress.

## 2014-05-25 NOTE — ED Provider Notes (Signed)
CSN: [id number]     Arrival date & time 05/25/14  1954 History   First MD Initiated Contact with Patient 05/25/14 2043     Chief Complaint  Patient presents with  . Urinary Tract Infection   (Consider location/radiation/quality/duration/timing/severity/associated sxs/prior Treatment) Patient is a 81 y.o. male presenting with urinary tract infection. The history is provided by the patient and the spouse.  Urinary Tract Infection This is a new problem. The current episode started 2 days ago. The problem has been gradually worsening (chronic leakagefrom prostate ca treatment.). Associated symptoms include abdominal pain. Pertinent negatives include no chest pain.    Past Medical History  Diagnosis Date  . HTN (hypertension)     Negative renal duplex 07-22-11  . 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 Esophageal stricture     Distal, benign  . Diverticulosis   . GERD (gastroesophageal reflux disease)   . Hemorrhoids   . Allergic rhinitis   . Prostate cancer 2000    Seed XRT  . Dyslipidemia   . Chronic pain syndrome     Left shoulder, back  . Carotid stenosis     a. s/p Left CEA 2002;  b. Carotid US 3/15:  patent L CEA, R < 40%  . Asthma   . Anemia   . Hx of echocardiogram     a. Echo 10/11: mod LVH, EF 55-60%  . Pneumonia 07-2013   Past Surgical History  Procedure Laterality Date  . Cervical disc surgery  8/07  . Lumbar disc surgery    . Appendectomy    . Cataract extraction Bilateral   . Insertion prostate radiation seed  2000  . Carotid endarterectomy  2002    L cea  . Nasal sinus surgery      x 3  . Carotid endarterectomy Right 01/2010   Family History  Problem Relation Age of Onset  . Lung cancer Brother   . Stroke Sister    History  Substance Use Topics  . Smoking status: Former Smoker -- 0.25 packs/day for 35 years    Types: Cigarettes    Quit date: 01/12/1978  . Smokeless tobacco: Current User    Types: Chew  . Alcohol Use: No   Comment: Prior alcoholic, sober since 2585    Review of Systems  Constitutional: Positive for fever and chills.  Cardiovascular: Negative for chest pain.  Gastrointestinal: Positive for abdominal pain.  Genitourinary: Positive for dysuria, urgency, hematuria and difficulty urinating. Negative for testicular pain.  Skin: Negative.     Allergies  Oxycodone-acetaminophen; Codeine; Diovan; Hctz; Oxycodone-acetaminophen; and Tramadol  Home Medications   Prior to Admission medications   Medication Sig Start Date End Date Taking? Authorizing Provider  amLODipine (NORVASC) 5 MG tablet Take 1 tablet (5 mg total) by mouth 2 (two) times daily. 12/29/13  Yes Fay Records, MD  aspirin 325 MG tablet Take 325 mg by mouth daily.   Yes Historical Provider, MD  atorvastatin (LIPITOR) 40 MG tablet Take 40 mg by mouth every morning.   Yes Historical Provider, MD  clopidogrel (PLAVIX) 75 MG tablet Take 1 tablet (75 mg total) by mouth daily. 05/15/13  Yes Fay Records, MD  gabapentin (NEURONTIN) 300 MG capsule TAKE 1 CAPSULE THREE TIMES A DAY Patient taking differently: TAKE 1 CAPSULE BY MOUTH THREE TIMES A DAY 11/10/13  Yes Rowe Clack, MD  montelukast (SINGULAIR) 10 MG tablet TAKE 1 TABLET EVERY DAY AT BEDTIME 03/12/14  Yes Davonna Belling  Hoyle Barr, MD  omeprazole (PRILOSEC) 40 MG capsule Take 1 capsule (40 mg total) by mouth daily. 12/27/13  Yes Rowe Clack, MD  albuterol (PROVENTIL HFA;VENTOLIN HFA) 108 (90 BASE) MCG/ACT inhaler Inhale 1-2 puffs into the lungs every 6 (six) hours as needed for wheezing or shortness of breath. 03/15/14   Fay Records, MD  budesonide-formoterol [facility]) 160-4.5 MCG/ACT inhaler Inhale 2 puffs into the lungs 2 (two) times daily. 11/06/13   Rigoberto Noel, MD  cephALEXin (KEFLEX) 500 MG capsule Take 1 capsule (500 mg total) by mouth 4 (four) times daily. Take all of medicine and drink lots of fluids 05/25/14   Billy Fischer, MD  cetirizine (ZYRTEC) 10 MG tablet Take 10 mg by  mouth daily.    Historical Provider, MD  cloNIDine (CATAPRES) 0.1 MG tablet Take 1 tablet (0.1 mg total) by mouth 2 (two) times daily. 05/15/14   Fay Records, MD  docusate sodium (COLACE) 100 MG capsule Take 100 mg by mouth 2 (two) times daily.    Historical Provider, MD  fluticasone (FLONASE) 50 MCG/ACT nasal spray Place 2 sprays into both nostrils daily. 04/27/14   Olga Millers, MD  magnesium oxide (MAG-OX) 400 MG tablet Take 400 mg by mouth 2 (two) times daily.    Historical Provider, MD  meloxicam (MOBIC) 15 MG tablet Take 1 tablet (15 mg total) by mouth daily. 04/27/14   Olga Millers, MD  methyldopa (ALDOMET) 500 MG tablet TAKE 2 TABLETS IN THE MORNING, 1 TABLET MID DAY AND 2 TABLETS IN THE EVENING 04/23/14   Fay Records, MD  polyethylene glycol [facility] / GLYCOLAX) packet Take 17 g by mouth as needed for mild constipation.     Historical Provider, MD  senna-docusate (SENOKOT-S) 8.6-50 MG per tablet Take 1 tablet by mouth daily as needed for mild constipation.     Historical Provider, MD  triamcinolone cream (KENALOG) 0.5 % Apply 1 application topically as needed (for skin irritation).  01/22/12   Janith Lima, MD  Wheat Dextrin (BENEFIBER PO) Take 1 scoop by mouth 2 (two) times daily. Powder- Mix with liquid    Historical Provider, MD   BP 203/75 mmHg  Pulse 66  Temp(Src) 99.2 F (37.3 C) (Oral)  Resp 14  SpO2 95% Physical Exam  Constitutional: He is oriented to person, place, and time. He appears well-developed and well-nourished. No distress.  Abdominal: Soft. Bowel sounds are normal. He exhibits no distension and no mass. There is no tenderness. There is no rebound and no guarding.  Genitourinary: Penis normal.  Neurological: He is alert and oriented to person, place, and time.  Skin: Skin is warm and dry.  Nursing note and vitals reviewed.   ED Course  Procedures (including critical care time) Labs Review Labs Reviewed  POCT URINALYSIS DIP (DEVICE) - Abnormal;  Notable for the following:    Glucose, UA 100 (*)    Protein, ur 30 (*)    Nitrite POSITIVE (*)    Leukocytes, UA TRACE (*)    All other components within normal limits  URINE CULTURE    Imaging Review No results found.   MDM   1. UTI (lower urinary tract infection)        Billy Fischer, MD 05/25/14 2155

## 2014-05-26 LAB — URINE CULTURE

## 2014-05-28 ENCOUNTER — Telehealth: Payer: Self-pay | Admitting: Internal Medicine

## 2014-05-28 NOTE — Telephone Encounter (Signed)
Patient is requesting a [facility] referral to Dr. Prudencio Burly.  Patients appointment is 5/19.

## 2014-05-28 NOTE — Telephone Encounter (Signed)
I spoke w/the patient's spouse last week. This referral has already been completed and faxed to [facility].

## 2014-05-30 DIAGNOSIS — M47817 Spondylosis without myelopathy or radiculopathy, lumbosacral region: Secondary | ICD-10-CM | POA: Diagnosis not present

## 2014-05-31 DIAGNOSIS — Z961 Presence of intraocular lens: Secondary | ICD-10-CM | POA: Diagnosis not present

## 2014-05-31 DIAGNOSIS — H5310 Unspecified subjective visual disturbances: Secondary | ICD-10-CM | POA: Diagnosis not present

## 2014-06-12 ENCOUNTER — Ambulatory Visit (INDEPENDENT_AMBULATORY_CARE_PROVIDER_SITE_OTHER): Payer: Commercial Managed Care - HMO | Admitting: Pulmonary Disease

## 2014-06-12 ENCOUNTER — Encounter: Payer: Self-pay | Admitting: Pulmonary Disease

## 2014-06-12 VITALS — BP 98/50 | HR 83 | Ht 71.0 in | Wt 177.0 lb

## 2014-06-12 DIAGNOSIS — J4530 Mild persistent asthma, uncomplicated: Secondary | ICD-10-CM | POA: Diagnosis not present

## 2014-06-12 DIAGNOSIS — I10 Essential (primary) hypertension: Secondary | ICD-10-CM

## 2014-06-12 NOTE — Patient Instructions (Signed)
Schedule breathing test -spirometry pre/post ONLY Symbicort 160 samples & Rx Pl Keep BP record & make appt with Dr Harrington Challenger

## 2014-06-12 NOTE — Assessment & Plan Note (Signed)
Pl Keep BP record & make appt with Dr Harrington Challenger -on clonidine , aldomet & amlodipin

## 2014-06-12 NOTE — Assessment & Plan Note (Addendum)
Schedule spirometry pre-and post, if significant broncho-dilator response this may suggest a component of asthma Symbicort 160 samples & Rx -I have asked him to be more compliant with this. Hopefully that should reduce the use of albuterol

## 2014-06-12 NOTE — Progress Notes (Signed)
   Subjective:    Patient ID: [name], male    DOB: 06-Sep-1929, 81 y.o.   MRN: [id number]  HPI  81 year old remote smoker presents for FU of copd vs asthma.  Smoked 2 packs per week for about 30 years before he quit in 1980.  He reports asthma as a child which recurred as an adult but has not bothered him in the last decade.He underwent allergy evaluation by Dr. Donneta Romberg , allergy testing was nondiagnostic, he was started on Singulair    Environment- he lives in a 48 year old home with cats, hardwood floors, 67-year-old mattress and central air conditioning, and obvious exposure to mold    06/12/2014  Chief Complaint  Patient presents with  . Asthma    Breathing doing well.  wheezing sometimes, but not often.  no concerns.   FU after 76mBP low today - dr rHarrington Challengermanaging Some somnolence persists-On clonidine , neurontin + aldomet + amlodipin Breathing ok - misses dose of symbicort often per wife - takes albuterol prn  Significant tests/ events  Spirometry 08/2013  moderate airway obstruction with FEV1 62%, FVC 68% and ratio of 65. Adm 08/10/13 for fever,left-sided pneumonia and leukocytosis - 18k. ABG 7.36/43/52  Increased somnolence- head CT, EEG neg - Tramadol decreased  CXR 08/2013 - resolving infx  Review of Systems neg for any significant sore throat, dysphagia, itching, sneezing, nasal congestion or excess/ purulent secretions, fever, chills, sweats, unintended wt loss, pleuritic or exertional cp, hempoptysis, orthopnea pnd or change in chronic leg swelling. Also denies presyncope, palpitations, heartburn, abdominal pain, nausea, vomiting, diarrhea or change in bowel or urinary habits, dysuria,hematuria, rash, arthralgias, visual complaints, headache, numbness weakness or ataxia.     Objective:   Physical Exam  Gen. Pleasant, well-nourished, in no distress ENT - no lesions, no post nasal drip Neck: No JVD, no thyromegaly, no carotid bruits Lungs: no use of accessory muscles,  no dullness to percussion, clear without rales or rhonchi  Cardiovascular: Rhythm regular, heart sounds  normal, no murmurs or gallops, no peripheral edema Musculoskeletal: No deformities, no cyanosis or clubbing        Assessment & Plan:

## 2014-06-13 DIAGNOSIS — N3946 Mixed incontinence: Secondary | ICD-10-CM | POA: Diagnosis not present

## 2014-06-13 DIAGNOSIS — R350 Frequency of micturition: Secondary | ICD-10-CM | POA: Diagnosis not present

## 2014-06-13 DIAGNOSIS — R300 Dysuria: Secondary | ICD-10-CM | POA: Diagnosis not present

## 2014-06-15 ENCOUNTER — Other Ambulatory Visit: Payer: Self-pay | Admitting: Internal Medicine

## 2014-06-18 ENCOUNTER — Other Ambulatory Visit: Payer: Self-pay

## 2014-06-18 DIAGNOSIS — H5310 Unspecified subjective visual disturbances: Secondary | ICD-10-CM | POA: Diagnosis not present

## 2014-06-25 ENCOUNTER — Other Ambulatory Visit: Payer: Self-pay | Admitting: Pulmonary Disease

## 2014-07-04 DIAGNOSIS — I998 Other disorder of circulatory system: Secondary | ICD-10-CM | POA: Diagnosis not present

## 2014-07-27 ENCOUNTER — Other Ambulatory Visit: Payer: Self-pay | Admitting: Internal Medicine

## 2014-07-27 DIAGNOSIS — M47816 Spondylosis without myelopathy or radiculopathy, lumbar region: Secondary | ICD-10-CM | POA: Diagnosis not present

## 2014-08-22 DIAGNOSIS — Z85828 Personal history of other malignant neoplasm of skin: Secondary | ICD-10-CM | POA: Diagnosis not present

## 2014-08-22 DIAGNOSIS — L301 Dyshidrosis [pompholyx]: Secondary | ICD-10-CM | POA: Diagnosis not present

## 2014-08-22 DIAGNOSIS — L821 Other seborrheic keratosis: Secondary | ICD-10-CM | POA: Diagnosis not present

## 2014-08-22 DIAGNOSIS — L570 Actinic keratosis: Secondary | ICD-10-CM | POA: Diagnosis not present

## 2014-08-22 DIAGNOSIS — L820 Inflamed seborrheic keratosis: Secondary | ICD-10-CM | POA: Diagnosis not present

## 2014-08-28 ENCOUNTER — Other Ambulatory Visit: Payer: Self-pay | Admitting: Internal Medicine

## 2014-09-05 DIAGNOSIS — M47817 Spondylosis without myelopathy or radiculopathy, lumbosacral region: Secondary | ICD-10-CM | POA: Diagnosis not present

## 2014-09-10 ENCOUNTER — Other Ambulatory Visit: Payer: Self-pay | Admitting: *Deleted

## 2014-09-10 MED ORDER — METHYLDOPA 500 MG PO TABS: ORAL_TABLET | ORAL | Status: DC

## 2014-09-19 ENCOUNTER — Other Ambulatory Visit: Payer: Self-pay

## 2014-09-19 MED ORDER — GABAPENTIN 300 MG PO CAPS: 300.0000 mg | ORAL_CAPSULE | Freq: Three times a day (TID) | ORAL | Status: DC

## 2014-09-25 ENCOUNTER — Other Ambulatory Visit: Payer: Self-pay | Admitting: *Deleted

## 2014-09-25 MED ORDER — ATORVASTATIN CALCIUM 40 MG PO TABS: 40.0000 mg | ORAL_TABLET | Freq: Every day | ORAL | Status: DC

## 2014-09-27 ENCOUNTER — Other Ambulatory Visit: Payer: Self-pay | Admitting: Internal Medicine

## 2014-10-04 ENCOUNTER — Encounter: Payer: Self-pay | Admitting: Internal Medicine

## 2014-10-18 ENCOUNTER — Encounter: Payer: Self-pay | Admitting: Internal Medicine

## 2014-10-18 ENCOUNTER — Ambulatory Visit (INDEPENDENT_AMBULATORY_CARE_PROVIDER_SITE_OTHER): Payer: Commercial Managed Care - HMO | Admitting: Internal Medicine

## 2014-10-18 VITALS — BP 108/50 | HR 62 | Ht 71.0 in | Wt 174.0 lb

## 2014-10-18 DIAGNOSIS — I10 Essential (primary) hypertension: Secondary | ICD-10-CM | POA: Diagnosis not present

## 2014-10-18 DIAGNOSIS — R5383 Other fatigue: Secondary | ICD-10-CM | POA: Diagnosis not present

## 2014-10-18 DIAGNOSIS — R531 Weakness: Secondary | ICD-10-CM | POA: Diagnosis not present

## 2014-10-18 DIAGNOSIS — R7309 Other abnormal glucose: Secondary | ICD-10-CM | POA: Diagnosis not present

## 2014-10-18 LAB — CBC
HCT: 37.2 % — ABNORMAL LOW (ref 39.0–52.0)
Hemoglobin: 12.6 g/dL — ABNORMAL LOW (ref 13.0–17.0)
MCH: 30.3 pg (ref 26.0–34.0)
MCHC: 33.9 g/dL (ref 30.0–36.0)
MCV: 89.4 fL (ref 78.0–100.0)
MPV: 8.9 fL (ref 8.6–12.4)
Platelets: 242 10*3/uL (ref 150–400)
RBC: 4.16 MIL/uL — AB (ref 4.22–5.81)
RDW: 14 % (ref 11.5–15.5)
WBC: 6.5 10*3/uL (ref 4.0–10.5)

## 2014-10-18 LAB — HEMOGLOBIN A1C
Hgb A1c MFr Bld: 6 % — ABNORMAL HIGH (ref <5.7)
Mean Plasma Glucose: 126 mg/dL — ABNORMAL HIGH (ref <117)

## 2014-10-18 MED ORDER — METHYLDOPA 500 MG PO TABS: ORAL_TABLET | ORAL | Status: DC

## 2014-10-18 NOTE — Patient Instructions (Signed)
Your physician recommends that you continue on your current medications as directed. Please refer to the Current Medication list given to you today. Your physician recommends that you return for lab work in: today (CBC, HgA1C, CMET, TSH)  You have been referred to [place] physician wants you to follow-up in: [place].  You will receive a reminder letter in the mail two months in advance. If you don't receive a letter, please call our office to schedule the follow-up appointment.

## 2014-10-18 NOTE — Progress Notes (Signed)
Cardiology Office Note   Date:  10/18/2014   ID:  [name], DOB 07-Jan-1930, MRN [id number]  PCP:  Gwendolyn Grant, MD  Cardiologist:   [name], MD   No chief complaint on file.     History of Present Illness: [name] is a 81 y.o. male with a history of difficult to control HTN, reactive airway disease, prior CVA, carotid stenosis s/p L CEA 2002, HL.  I saw him in clinic in March   Breathing is OK now  Occasional wheezing Denies CP   Weak  Esp L leg Stopped checking BP in July  150s   Has been helping nurse wife     Current Outpatient Prescriptions  Medication Sig Dispense Refill  . albuterol (PROVENTIL HFA;VENTOLIN HFA) 108 (90 BASE) MCG/ACT inhaler Inhale 1-2 puffs into the lungs every 6 (six) hours as needed for wheezing or shortness of breath. 1 Inhaler 6  . amLODipine (NORVASC) 5 MG tablet TAKE 1 TABLET TWICE DAILY 180 tablet 3  . aspirin 325 MG tablet Take 325 mg by mouth daily.    [name] Kitchen atorvastatin (LIPITOR) 40 MG tablet Take 1 tablet (40 mg total) by mouth daily. 7 tablet 0  . budesonide-formoterol (SYMBICORT) 160-4.5 MCG/ACT inhaler Inhale 2 puffs into the lungs 2 (two) times daily. 1 Inhaler 0  . cetirizine (ZYRTEC) 10 MG tablet Take 10 mg by mouth daily.    . cloNIDine (CATAPRES) 0.1 MG tablet TAKE 1 TABLET EVERY DAY  AT  10:00  PM 90 tablet 0  . clopidogrel (PLAVIX) 75 MG tablet Take 1 tablet (75 mg total) by mouth daily. 90 tablet 3  . docusate sodium (COLACE) 100 MG capsule Take 100 mg by mouth 2 (two) times daily.    . fluticasone (FLONASE) 50 MCG/ACT nasal spray Place 2 sprays into both nostrils daily. 16 g 6  . gabapentin (NEURONTIN) 300 MG capsule Take 1 capsule (300 mg total) by mouth 3 (three) times daily. 270 capsule 1  . methyldopa (ALDOMET) 500 MG tablet Take 2 tablets by mouth in the morning, 1 tablet by mouth mid day and 2 tablets by mouth in the evening 150 tablet 2  . montelukast (SINGULAIR) 10 MG tablet TAKE 1 TABLET EVERY DAY AT  BEDTIME 90 tablet 1  . omeprazole (PRILOSEC) 40 MG capsule Take 1 capsule (40 mg total) by mouth daily. 90 capsule 3  . polyethylene glycol (MIRALAX / GLYCOLAX) packet Take 17 g by mouth as needed for mild constipation.     . senna-docusate (SENOKOT-S) 8.6-50 MG per tablet Take 1 tablet by mouth daily as needed for mild constipation.     . traMADol (ULTRAM) 50 MG tablet Take 50 mg by mouth every 6 (six) hours as needed. pain  0  . triamcinolone cream (KENALOG) 0.5 % Apply 1 application topically as needed (for skin irritation).     . Wheat Dextrin (BENEFIBER PO) Take 1 scoop by mouth 2 (two) times daily. Powder- Mix with liquid    . [DISCONTINUED] valsartan (DIOVAN) 160 MG tablet Take 160 mg by mouth daily.       No current facility-administered medications for this visit.    Allergies:   Oxycodone-acetaminophen; Codeine; Diovan; Hctz; Oxycodone-acetaminophen; and Tramadol   Past Medical History  Diagnosis Date  . HTN (hypertension)     Negative renal duplex 07-22-11  . Stroke [facility]) 8/01    right thalamic - on chronic Plavix/ASA  . Esophageal stricture     Distal, benign  .  Diverticulosis   . GERD (gastroesophageal reflux disease)   . Hemorrhoids   . Allergic rhinitis   . Prostate cancer ([place]) 2000    Seed XRT  . Dyslipidemia   . Chronic pain syndrome     Left shoulder, back  . Carotid stenosis     a. s/p Left CEA 2002;  b. Carotid US 3/15:  patent L CEA, R < 40%  . Asthma   . Anemia   . Hx of echocardiogram     a. Echo 10/11: mod LVH, EF 55-60%  . Pneumonia 07-2013    Past Surgical History  Procedure Laterality Date  . Cervical disc surgery  8/07  . Lumbar disc surgery    . Appendectomy    . Cataract extraction Bilateral   . Insertion prostate radiation seed  2000  . Carotid endarterectomy  2002    L cea  . Nasal sinus surgery      x 3  . Carotid endarterectomy Right 01/2010     Social History:  The patient  reports that he quit smoking about 36 years ago. His  smoking use included Cigarettes. He has a 8.75 pack-year smoking history. His smokeless tobacco use includes Chew. He reports that he does not drink alcohol or use illicit drugs.   Family History:  The patient's family history includes Lung cancer in his brother; Stroke in his sister.    ROS:  Please see the history of present illness. All other systems are reviewed and  Negative to the above problem except as noted.    PHYSICAL EXAM: VS:  BP 108/50 mmHg  Pulse 62  Ht '5\' 11"'$  (1.803 m)  Wt 174 lb (78.926 kg)  BMI 24.28 kg/m2  SpO2 98%  GEN: Well nourished, well developed, in no acute distress HEENT: normal Neck: no JVD, carotid bruits, or masses Cardiac: RRR; no murmurs, rubs, or gallops,no edema  Respiratory:  clear to auscultation bilaterally, normal work of breathing GI: soft, nontender, nondistended, + BS  No hepatomegaly  MS: no deformity Moving all extremities   Skin: warm and dry, no rash Neuro:  Full exam deferred.  Appears weak on L side  Psych: euthymic mood, full affect   EKG:  EKG is not ordered today.   Lipid Panel    Component Value Date/Time   CHOL 103 11/30/2013 1121   TRIG 72.0 11/30/2013 1121   HDL 38.70* 11/30/2013 1121   CHOLHDL 3 11/30/2013 1121   VLDL 14.4 11/30/2013 1121   LDLCALC 50 11/30/2013 1121      Wt Readings from Last 3 Encounters:  10/18/14 174 lb (78.926 kg)  06/12/14 177 lb (80.287 kg)  04/27/14 179 lb 4.8 oz (81.33 kg)      ASSESSMENT AND PLAN:  1  HTN  BP is great today  I would continue on current regimen  He has had labile readings.  2.  Weakness  Has fall  Will set up for PT  3.  HCM  WIll set up for HgA1C, lipid panel, BMET and TSH    F/U in [facility]    Signed, [name], MD  10/18/2014 9:31 AM    [address], [place], [place]  [address] Phone: [phone]; Fax: [phone]

## 2014-10-19 LAB — COMPREHENSIVE METABOLIC PANEL
ALT: 10 U/L (ref 9–46)
AST: 17 U/L (ref 10–35)
Albumin: 3.9 g/dL (ref 3.6–5.1)
Alkaline Phosphatase: 49 U/L (ref 40–115)
BUN: 22 mg/dL (ref 7–25)
CO2: 26 mmol/L (ref 20–31)
CREATININE: 0.99 mg/dL (ref 0.70–1.11)
Calcium: 9 mg/dL (ref 8.6–10.3)
Chloride: 106 mmol/L (ref 98–110)
GLUCOSE: 87 mg/dL (ref 65–99)
Potassium: 4.9 mmol/L (ref 3.5–5.3)
SODIUM: 139 mmol/L (ref 135–146)
Total Bilirubin: 0.4 mg/dL (ref 0.2–1.2)
Total Protein: 6.2 g/dL (ref 6.1–8.1)

## 2014-10-23 ENCOUNTER — Telehealth: Payer: Self-pay | Admitting: Internal Medicine

## 2014-10-23 LAB — TSH

## 2014-10-23 NOTE — Telephone Encounter (Signed)
Spoke with patient about recent lab results 

## 2014-10-23 NOTE — Telephone Encounter (Signed)
F/u   Pt calling back concerning Lab results. Please call back and discuss.

## 2014-10-26 ENCOUNTER — Telehealth: Payer: Self-pay | Admitting: *Deleted

## 2014-10-26 NOTE — Telephone Encounter (Signed)
Patient returned phone call to be scheduled for a lab visit. He will be here to have TSH drawn 11/02/2014.

## 2014-10-26 NOTE — Telephone Encounter (Signed)
Called patient to schedule a lab appointment he would like me to call him back around 2 pm. 10/26/2014

## 2014-11-02 ENCOUNTER — Other Ambulatory Visit: Payer: Commercial Managed Care - HMO

## 2014-11-06 ENCOUNTER — Telehealth: Payer: Self-pay | Admitting: Internal Medicine

## 2014-11-06 NOTE — Telephone Encounter (Signed)
New message    Patient wife calling - checking on the status of physical therapy from last office visit

## 2014-11-07 NOTE — Telephone Encounter (Signed)
Left message to call back  

## 2014-11-08 ENCOUNTER — Other Ambulatory Visit: Payer: Commercial Managed Care - HMO

## 2014-11-14 ENCOUNTER — Ambulatory Visit: Payer: Commercial Managed Care - HMO | Admitting: Physical Therapy

## 2014-11-15 ENCOUNTER — Ambulatory Visit
Payer: Commercial Managed Care - HMO | Attending: Internal Medicine | Admitting: Rehabilitative and Restorative Service Providers"

## 2014-11-15 ENCOUNTER — Other Ambulatory Visit (INDEPENDENT_AMBULATORY_CARE_PROVIDER_SITE_OTHER): Payer: Commercial Managed Care - HMO | Admitting: *Deleted

## 2014-11-15 DIAGNOSIS — I6350 Cerebral infarction due to unspecified occlusion or stenosis of unspecified cerebral artery: Secondary | ICD-10-CM

## 2014-11-15 DIAGNOSIS — R531 Weakness: Secondary | ICD-10-CM | POA: Insufficient documentation

## 2014-11-15 DIAGNOSIS — R269 Unspecified abnormalities of gait and mobility: Secondary | ICD-10-CM | POA: Diagnosis not present

## 2014-11-15 DIAGNOSIS — I10 Essential (primary) hypertension: Secondary | ICD-10-CM | POA: Diagnosis not present

## 2014-11-15 LAB — TSH: TSH: 3.159 u[IU]/mL (ref 0.350–4.500)

## 2014-11-15 NOTE — Addendum Note (Signed)
Addended by: Eulis Foster on: 11/15/2014 12:32 PM   Modules accepted: Orders

## 2014-11-15 NOTE — Therapy (Signed)
[facility] [address] [place], [place], [address] Phone: [phone]   Fax:  [phone]  Physical Therapy Evaluation  Patient Details  Name: [name] MRN: [id number] Date of Birth: 03-Feb-1929 Referring Provider: [name], MD  Encounter Date: 11/15/2014      PT End of Session - 11/15/14 1454    Visit Number 1   Number of Visits 12   Date for PT Re-Evaluation 12/28/14   Authorization Type G code every 10th visit $45 copay   PT Start Time 1100   PT Stop Time 1140   PT Time Calculation (min) 40 min   Equipment Utilized During Treatment Gait belt   Activity Tolerance Patient tolerated treatment well   Behavior During Therapy [facility] for tasks assessed/performed      Past Medical History  Diagnosis Date  . HTN (hypertension)     Negative renal duplex 07-22-11  . 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 Esophageal stricture     Distal, benign  . Diverticulosis   . GERD (gastroesophageal reflux disease)   . Hemorrhoids   . Allergic rhinitis   . Prostate cancer ([place]) 2000    Seed XRT  . Dyslipidemia   . Chronic pain syndrome     Left shoulder, back  . Carotid stenosis     a. s/p Left CEA 2002;  b. Carotid US 3/15:  patent L CEA, R < 40%  . Asthma   . Anemia   . Hx of echocardiogram     a. Echo 10/11: mod LVH, EF 55-60%  . Pneumonia 07-2013    Past Surgical History  Procedure Laterality Date  . Cervical disc surgery  8/07  . Lumbar disc surgery    . Appendectomy    . Cataract extraction Bilateral   . Insertion prostate radiation seed  2000  . Carotid endarterectomy  2002    L cea  . Nasal sinus surgery      x 3  . Carotid endarterectomy Right 01/2010    There were no vitals filed for this visit.  Visit Diagnosis:  Generalized weakness - Plan: PT plan of care cert/re-cert  Abnormality of gait - Plan: PT plan of care cert/re-cert      Subjective Assessment - 11/15/14  1107    Subjective The patient reports h/o weakness in L side since stroke in 2003 and also notes numbness.  He notes unsteadiness when he first rises from a chair after sitting for awhile.  He denies falls in the past 6 months.  He reports that he is dragging his left leg at times and trips on the left leg at times.  "My right side is okay".     Pertinent History CVA 203   Patient Stated Goals Patient hopes that walking and leg strength improve.   Currently in Pain? No/denies            [facility] PT Assessment - 11/15/14 1054    Assessment   Medical Diagnosis L leg weakness   Referring Provider [name], MD   Onset Date/Surgical Date 10/18/14   Prior Therapy none   Precautions   Precautions Fall   Balance Screen   Has the patient fallen in the past 6 months No   Has the patient had a decrease in activity level because of a fear of falling?  No   Is the patient reluctant to leave their home because of a fear of  falling?  No   Home Environment   Living Environment Private residence   Living Arrangements Spouse/significant other   Type of [place] to enter   Entrance Stairs-Number of Steps [place] One level   Additional Comments Doesn't use devices often   Prior Function   Level of Independence Independent  helps care for wife after surgery.   Observation/Other Assessments   Focus on Therapeutic Outcomes (FOTO)  77%   Other Surveys  --  neuro QOL=50.3%   Sensation   Light Touch Impaired Detail   Light Touch Impaired Details Impaired LLE;Impaired LUE   Additional Comments more numbness in the morning and when he gets tired   Posture/Postural Control   Posture/Postural Control Postural limitations   Postural Limitations Rounded Shoulders;Forward head;Increased thoracic kyphosis   ROM / Strength   AROM / PROM / Strength AROM;Strength   AROM   Overall AROM  Deficits   AROM Assessment Site Knee   Right/Left Knee  Right;Left   Right Knee Extension --  -8 deg from full extension due to hamstring tightness-seated   Left Knee Extension --  -15 deg from full ext seated   Strength   Overall Strength Deficits   Overall Strength Comments R UE shoulder flexion/abduction 4/5, L shoulder flexion/abduction 4-/5, bilateral elbow flexion 5/5, R hip flexion 4/5, L hip flexion 3/5, R knee extension 4/5, R knee extension 4/5, R ankle DF 4/5, L knee flexion 3/5, L knee extension (limited by hamstring tightness) 4-/5, L ankle DF 4-/5   Ambulation/Gait   Ambulation/Gait Yes   Ambulation/Gait Assistance 5: Supervision   Ambulation Distance (Feet) 200 Feet   Gait Pattern Decreased dorsiflexion - right;Decreased dorsiflexion - left;Decreased stride length;Decreased step length - left;Decreased step length - right;Decreased arm swing - right;Decreased arm swing - left;Right foot flat;Left foot flat   Gait velocity 2.07 ft/sec   Stairs Yes   Stairs Assistance 6: Modified independent (Device/Increase time)   Stair Management Technique One rail Left;Alternating pattern   Number of Stairs 4   Standardized Balance Assessment   Standardized Balance Assessment Berg Balance Test;Timed Up and Go Test   Berg Balance Test   Sit to Stand Able to stand  independently using hands   Standing Unsupported Able to stand safely 2 minutes   Sitting with Back Unsupported but Feet Supported on Floor or Stool Able to sit safely and securely 2 minutes   Stand to Sit Sits safely with minimal use of hands   Transfers Able to transfer safely, definite need of hands   Standing Unsupported with Eyes Closed Able to stand 10 seconds with supervision   Standing Ubsupported with Feet Together Able to place feet together independently and stand 1 minute safely   From Standing, Reach Forward with Outstretched Arm Can reach forward >5 cm safely (2")   From Standing Position, Pick up Object from Floor Able to pick up shoe, needs supervision   From  Standing Position, Turn to Look Behind Over each Shoulder Needs assist to keep from losing balance and falling  falls posteriorly   Turn 360 Degrees Needs assistance while turning  nedds help to avoid fall   Standing Unsupported, Alternately Place Feet on Step/Stool Able to complete 4 steps without aid or supervision   Standing Unsupported, One Foot in Front Able to take small step independently and hold 30 seconds   Standing on One Leg Able to lift leg independently  and hold 5-10 seconds   Total Score 37   Berg comment: 37/56 indicating high risk for falls, turns most difficult   Timed Up and Go Test   TUG --  16.92 seconds without a device    TUG Comments indicates high risk for falls           [facility] PT Treatment/Exercise - 11/15/14 1132    Exercises   Exercises Knee/Hip;Lumbar;Ankle   Lumbar Exercises: Stretches   Active Hamstring Stretch 30 seconds;2 reps  provided for HEP   Passive Hamstring Stretch 1 rep;30 seconds  right and left Harmon supine position   Passive Hamstring Stretch Limitations --  significant tightness noted   Ankle Exercises: Stretches   Gastroc Stretch 1 rep;30 seconds  provided HEP           PT Education - 11/15/14 1144    Education provided Yes   Education Details HEP; sit<>stand, heel cord stretch, hamstring stretch   Person(s) Educated Patient   Methods Explanation;Demonstration;Handout   Comprehension Verbalized understanding;Returned demonstration          PT Short Term Goals - 11/15/14 1455    PT SHORT TERM GOAL #1   Title The patient will be indep with HEP for LE strengthening, flexibility, and standing balance activities.   Baseline Target date 12/14/2014   Time 4   Period Weeks   PT SHORT TERM GOAL #2   Title The patient will improve Berg balance score from 37/56 to > or equal to 43/56 to demo decreasing risk for falls.   Baseline Target date 12/14/2014   Time 4   Period Weeks   PT SHORT TERM GOAL #3   Title The patient  will increase gait speed from 2.07 ft/sec to > or equal to 2.62 ft/sec to demo improving functional community gait.   Baseline Target date 12/14/2014   Time 4   Period Weeks   PT SHORT TERM GOAL #4   Title The patient will demonstrate improved gait mechanics without toe scuffing (foot drag) x 230 feet nonstop for safer household mobility.   Baseline Target date 12/14/2014   Time 4   Period Weeks           PT Long Term Goals - 11/15/14 1458    PT LONG TERM GOAL #1   Title The patient will improve neuro Quality of life by > or equal to 12% to demonstrate improved subjective perception of function.   Baseline Target date 12/28/2014   Time 6   Period Weeks   PT LONG TERM GOAL #2   Title The patient will improve Berg from 37/56 to > or equal to 45/56 to demo decreased risk for falls.   Baseline Target date 12/28/2014   Time 6   Period Weeks   PT LONG TERM GOAL #3   Title The patinet will decrease TUG from 16.92 sec to < or equal to 14 seconds to demo decreased risk for falls.   Baseline Target date 12/28/2014   Time 6   Period Weeks   PT LONG TERM GOAL #4   Title The patient will negotiate community surfaces without a device independently (including grass, curbs, inclines) x 500 feet.   Baseline Target date 12/28/2014   Time 6   Period Weeks   PT LONG TERM GOAL #5   Title The patient will verbalize understanding of return to community exercise program for post d/c activities.   Baseline Target date 12/28/2014   Time 6  Period Weeks               Plan - 12/06/14 1500    Clinical Impression Statement The patient is an 81 yo male with reports of declining mobility over the past few months noting episodes of tripping on his feet and decreased foot clearance.  He presents with high fall risk per Merrilee Jansky, TUG.  PT to progress mobility to patient tolerance and improve safety for household/community mobility.   Pt will benefit from skilled therapeutic intervention in order to  improve on the following deficits Abnormal gait;Decreased balance;Decreased mobility;Decreased strength;Postural dysfunction;Difficulty walking;Decreased range of motion;Impaired flexibility;Decreased activity tolerance   Rehab Potential Good   PT Frequency 2x / week   PT Duration 6 weeks   PT Treatment/Interventions ADLs/Self Care Home Management;Balance training;Neuromuscular re-education;Gait training;Stair training;Functional mobility training;Therapeutic activities;Therapeutic exercise;Manual techniques;Patient/family education   PT Next Visit Plan check HEP, work on gait mechanics emphasizing heel strike bilaterally + knee extension at terminal swing phase, LE strengthening (esp L side), general balance/mobility.   Consulted and Agree with Plan of Care Patient          G-Codes - 12-06-2014 1504    Functional Assessment Tool Used Berg=37/56, gait speed=2.07 ft/sec    Functional Limitation Mobility: Walking and moving around   Mobility: Walking and Moving Around Current Status [phone]) At least 20 percent but less than 40 percent impaired, limited or restricted   Mobility: Walking and Moving Around Goal Status [phone]) At least 1 percent but less than 20 percent impaired, limited or restricted       Problem List Patient Active Problem List   Diagnosis Date Noted  . Umbilical hernia 50/53/9767  . Asthma 10/08/2011  . Carotid stenosis 03/31/2011  . Chronic pain   . CAROTID BRUIT, LEFT 11/25/2009  . Vitamin D deficiency 12/11/2008  . HLD (hyperlipidemia) 12/11/2008  . ANEMIA 12/11/2008  . Cerebral artery occlusion with cerebral infarction ([place]) 12/11/2008  . URINARY INCONTINENCE, MALE 12/11/2008  . BURSITIS, LEFT SHOULDER 12/29/2007  . ANXIETY, MILD 08/09/2007  . HYPERTENSION, BENIGN ESSENTIAL 01/04/2007  . Allergic rhinitis 01/04/2007  . HEADACHE, TENSION 11/05/2006  . PROSTATE CANCER, HX OF 11/05/2006   Thank you for the referral of this patient. Rudell Cobb,  MPT  [place], PT December 06, 2014, 3:07 PM  [place] [address] [place], [place], [address] Phone: [phone]   Fax:  [phone]  Name: [name] MRN: [id number] Date of Birth: [birth year]

## 2014-11-15 NOTE — Patient Instructions (Signed)
Heel Cord Stretch    Place one leg forward, bent, other leg behind and straight. Lean forward keeping back heel flat. Hold __30__ seconds while counting out loud. Repeat with other leg. Repeat __3__ times. Do __2__ sessions per day.  http://gt2.exer.us/511   Copyright  VHI. All rights reserved.  Hamstring: Stretch (Sitting)    Sit straight PROP YOUR LEG ON A STOOL. Extend right knee until stretch is felt in back of thigh. Hold _30__ seconds. Relax. Repeat _3__ times on each leg. Do _2__ times a day.  Copyright  VHI. All rights reserved.  Functional Quadriceps: Sit to Stand    Sit on edge of chair, feet flat on floor. Stand upright, extending knees fully. STAND UP TALL* Repeat __10__ times per set. Do __1__ sets per session. Do __2__ sessions per day.  http://orth.exer.us/734   Copyright  VHI. All rights reserved.

## 2014-11-20 ENCOUNTER — Encounter: Payer: Self-pay | Admitting: Physical Therapy

## 2014-11-20 ENCOUNTER — Ambulatory Visit: Payer: Commercial Managed Care - HMO | Admitting: Physical Therapy

## 2014-11-20 DIAGNOSIS — R269 Unspecified abnormalities of gait and mobility: Secondary | ICD-10-CM | POA: Diagnosis not present

## 2014-11-20 DIAGNOSIS — R531 Weakness: Secondary | ICD-10-CM

## 2014-11-20 NOTE — Therapy (Signed)
[facility] [address] [place], [place], [address] Phone: [phone]   Fax:  [phone]  Physical Therapy Treatment  Patient Details  Name: [name] MRN: [id number] Date of Birth: 1929/05/05 Referring Provider: [name], MD  Encounter Date: 11/20/2014      PT End of Session - 11/20/14 0941    Visit Number 2   Number of Visits 12   Date for PT Re-Evaluation 12/28/14   Authorization Type G code every 10th visit $45 copay   PT Start Time 0932   PT Stop Time 1015   PT Time Calculation (min) 43 min   Equipment Utilized During Treatment Gait belt   Activity Tolerance Patient tolerated treatment well   Behavior During Therapy [facility] for tasks assessed/performed      Past Medical History  Diagnosis Date  . HTN (hypertension)     Negative renal duplex 07-22-11  . 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 Esophageal stricture     Distal, benign  . Diverticulosis   . GERD (gastroesophageal reflux disease)   . Hemorrhoids   . Allergic rhinitis   . Prostate cancer ([place]) 2000    Seed XRT  . Dyslipidemia   . Chronic pain syndrome     Left shoulder, back  . Carotid stenosis     a. s/p Left CEA 2002;  b. Carotid US 3/15:  patent L CEA, R < 40%  . Asthma   . Anemia   . Hx of echocardiogram     a. Echo 10/11: mod LVH, EF 55-60%  . Pneumonia 07-2013    Past Surgical History  Procedure Laterality Date  . Cervical disc surgery  8/07  . Lumbar disc surgery    . Appendectomy    . Cataract extraction Bilateral   . Insertion prostate radiation seed  2000  . Carotid endarterectomy  2002    L cea  . Nasal sinus surgery      x 3  . Carotid endarterectomy Right 01/2010    There were no vitals filed for this visit.  Visit Diagnosis:  Generalized weakness  Abnormality of gait      Subjective Assessment - 11/20/14 0938    Subjective Has new back pain that started last night and still  hurting. This is worse than back pain he has had in the past. Denies any falls.    Currently in Pain? Yes   Pain Score 8    Pain Location Back   Pain Orientation Lower   Pain Descriptors / Indicators Aching;Sharp;Sore;Discomfort   Pain Onset Yesterday   Pain Frequency Constant   Aggravating Factors  sitting up tall, changing positions,   Pain Relieving Factors tylenol- no change in pain, side lying relieves it some     Exercises:  Reviewed pt's current Hep and provided cues as needed on correct ex form/technique. seated edge of mat - sit<>stands x 10 reps with cues to increase base of support - hamstring stretch with foot propped on foot stool, 30 sec hold x 3 each leg  Hook lying with pball - passive knee to chest (hamstring curls), prolonged  holds x 10 reps - passive lower trunk rotation left<>right, 20 sec holds x 5 each way * pt reporting decreased pain at this time in session  Hook lying without pball: cues on technique, ex form - posterior pelvic tilt x 10 reps - pelvic rocking anterior/posterior x 10 resps - single knee  to chest stretch, 20 sec hold x 3 each side - double knee to chest stretch, 30 sec hold x 3 each side - lower trunk rotation left<>right, 20 sec holds x 3 each way * issued these to HEP.         PT Short Term Goals - 11/15/14 1455    PT SHORT TERM GOAL #1   Title The patient will be indep with HEP for LE strengthening, flexibility, and standing balance activities.   Baseline Target date 12/14/2014   Time 4   Period Weeks   PT SHORT TERM GOAL #2   Title The patient will improve Berg balance score from 37/56 to > or equal to 43/56 to demo decreasing risk for falls.   Baseline Target date 12/14/2014   Time 4   Period Weeks   PT SHORT TERM GOAL #3   Title The patient will increase gait speed from 2.07 ft/sec to > or equal to 2.62 ft/sec to demo improving functional community gait.   Baseline Target date 12/14/2014   Time 4   Period Weeks   PT  SHORT TERM GOAL #4   Title The patient will demonstrate improved gait mechanics without toe scuffing (foot drag) x 230 feet nonstop for safer household mobility.   Baseline Target date 12/14/2014   Time 4   Period Weeks           PT Long Term Goals - 11/15/14 1458    PT LONG TERM GOAL #1   Title The patient will improve neuro Quality of life by > or equal to 12% to demonstrate improved subjective perception of function.   Baseline Target date 12/28/2014   Time 6   Period Weeks   PT LONG TERM GOAL #2   Title The patient will improve Berg from 37/56 to > or equal to 45/56 to demo decreased risk for falls.   Baseline Target date 12/28/2014   Time 6   Period Weeks   PT LONG TERM GOAL #3   Title The patinet will decrease TUG from 16.92 sec to < or equal to 14 seconds to demo decreased risk for falls.   Baseline Target date 12/28/2014   Time 6   Period Weeks   PT LONG TERM GOAL #4   Title The patient will negotiate community surfaces without a device independently (including grass, curbs, inclines) x 500 feet.   Baseline Target date 12/28/2014   Time 6   Period Weeks   PT LONG TERM GOAL #5   Title The patient will verbalize understanding of return to community exercise program for post d/c activities.   Baseline Target date 12/28/2014   Time 6   Period Weeks           Plan - 11/20/14 0941    Clinical Impression Statement Pt reporting increased back pain today, unsure of cause. Skilled session focued on stretching for increased flexibility and reduced pain. Pt reported a decrease in pain after all stretches performed. Sent home additional stretches for lower back today. Pt making steady progress toward goals.                                      Pt will benefit from skilled therapeutic intervention in order to improve on the following deficits Abnormal gait;Decreased balance;Decreased mobility;Decreased strength;Postural dysfunction;Difficulty walking;Decreased range of  motion;Impaired flexibility;Decreased activity tolerance   Rehab Potential Good   PT  Frequency 2x / week   PT Duration 6 weeks   PT Treatment/Interventions ADLs/Self Care Home Management;Balance training;Neuromuscular re-education;Gait training;Stair training;Functional mobility training;Therapeutic activities;Therapeutic exercise;Manual techniques;Patient/family education   PT Next Visit Plan check HEP, work on gait mechanics emphasizing heel strike bilaterally + knee extension at terminal swing phase, LE strengthening (esp L side), general balance/mobility.   Consulted and Agree with Plan of Care Patient        Problem List Patient Active Problem List   Diagnosis Date Noted  . Umbilical hernia 65/68/1275  . Asthma 10/08/2011  . Carotid stenosis 03/31/2011  . Chronic pain   . CAROTID BRUIT, LEFT 11/25/2009  . Vitamin D deficiency 12/11/2008  . HLD (hyperlipidemia) 12/11/2008  . ANEMIA 12/11/2008  . Cerebral artery occlusion with cerebral infarction ([place]) 12/11/2008  . URINARY INCONTINENCE, MALE 12/11/2008  . BURSITIS, LEFT SHOULDER 12/29/2007  . ANXIETY, MILD 08/09/2007  . HYPERTENSION, BENIGN ESSENTIAL 01/04/2007  . Allergic rhinitis 01/04/2007  . HEADACHE, TENSION 11/05/2006  . PROSTATE CANCER, HX OF 11/05/2006    Willow Ora 11/21/2014, 8:32 AM  Willow Ora, PTA, [facility] [address], [place], [place] [address] [phone] 11/21/2014, 8:32 AM   Name: [name] MRN: [id number] Date of Birth: 25-Mar-1929

## 2014-11-20 NOTE — Patient Instructions (Signed)
Knee-to-Chest Stretch: Unilateral    With hand behind right knee/or using a towel, pull knee in to chest until a comfortable stretch is felt in lower back and buttocks. Keep back relaxed. Hold __30__ seconds. Repeat _3__ times each leg.  Do __1-2__ sessions per day.  http://orth.exer.us/126   Copyright  VHI. All rights reserved.  Knee-to-Chest Stretch: Bilateral    With hands behind knees/or using a towel, pull both knees in to chest until a comfortable stretch is felt in lower back and buttocks. Keep back relaxed. Hold __30__ seconds. Repeat __3__ times per set. Do __1_ sets per session. Do __1-2__ sessions per day.  http://orth.exer.us/128   Copyright  VHI. All rights reserved.  Lower Trunk Rotation Stretch    Keeping back flat and feet together, rotate knees to left side. Hold _30___ seconds. Repeat ___3_ times each way. Do __1-2 sessions per day.  http://orth.exer.us/122   Copyright  VHI. All rights reserved.

## 2014-11-22 ENCOUNTER — Ambulatory Visit: Payer: Commercial Managed Care - HMO | Admitting: Rehabilitative and Restorative Service Providers"

## 2014-11-22 ENCOUNTER — Encounter: Payer: Self-pay | Admitting: Rehabilitative and Restorative Service Providers"

## 2014-11-22 DIAGNOSIS — R531 Weakness: Secondary | ICD-10-CM | POA: Diagnosis not present

## 2014-11-22 DIAGNOSIS — R269 Unspecified abnormalities of gait and mobility: Secondary | ICD-10-CM

## 2014-11-22 NOTE — Patient Instructions (Signed)
Pt. Instructed on importance of HEP adherence with a focus on the stretching portion of HEP.

## 2014-11-22 NOTE — Therapy (Signed)
[facility] [address] [place], [place], [address] Phone: [phone]   Fax:  [phone]  Physical Therapy Treatment  Patient Details  Name: [name] MRN: [id number] Date of Birth: March 10, 1929 Referring Provider: [name], MD  Encounter Date: 11/22/2014      PT End of Session - 11/22/14 1134    Visit Number 3   Number of Visits 12   Date for PT Re-Evaluation 12/28/14   Authorization Type G code every 10th visit $45 copay   PT Start Time 1016   PT Stop Time 1100   PT Time Calculation (min) 44 min   Equipment Utilized During Treatment Gait belt   Activity Tolerance Patient tolerated treatment well   Behavior During Therapy [facility] for tasks assessed/performed      Past Medical History  Diagnosis Date  . HTN (hypertension)     Negative renal duplex 07-22-11  . 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 Esophageal stricture     Distal, benign  . Diverticulosis   . GERD (gastroesophageal reflux disease)   . Hemorrhoids   . Allergic rhinitis   . Prostate cancer ([place]) 2000    Seed XRT  . Dyslipidemia   . Chronic pain syndrome     Left shoulder, back  . Carotid stenosis     a. s/p Left CEA 2002;  b. Carotid US 3/15:  patent L CEA, R < 40%  . Asthma   . Anemia   . Hx of echocardiogram     a. Echo 10/11: mod LVH, EF 55-60%  . Pneumonia 07-2013    Past Surgical History  Procedure Laterality Date  . Cervical disc surgery  8/07  . Lumbar disc surgery    . Appendectomy    . Cataract extraction Bilateral   . Insertion prostate radiation seed  2000  . Carotid endarterectomy  2002    L cea  . Nasal sinus surgery      x 3  . Carotid endarterectomy Right 01/2010    There were no vitals filed for this visit.  Visit Diagnosis:  Generalized weakness  Abnormality of gait      Subjective Assessment - 11/22/14 1124    Subjective Pt. reports LB pain as 6/10 aching.  Pt. reports  adherence to HEP daily with full verbal recall of stretching portion of HEP.     Currently in Pain? Yes   Pain Score 6    Pain Location Back   Pain Orientation Lower   Pain Descriptors / Indicators Aching   Pain Type Chronic pain   Pain Onset More than a month ago   Pain Frequency Constant   Aggravating Factors  sitting up tall, changing positions,   Pain Relieving Factors tylenol - no change in pain, side lying relieves it some.     Effect of Pain on Daily Activities Pt. reports being less active since onset of LB pain.     Multiple Pain Sites No     Exercises (to increase strength and flexibility of the lumbopelvic complex to improve functional ability):  Reviewed pt's current Hep and provided cues as needed on correct ex form/technique. seated edge of mat - sit<>stands 2 x10 reps with cues to increase base of support; lower back extension with standing added with hands on LB pushing anterior. - hamstring stretch with foot propped on foot stool, 30 sec hold x 2 each leg  Hook lying: - passive knee  to chest, prolonged holds x 10 reps - passive lower trunk rotation left<>right, 20 sec holds x 2 each way * pt reporting decreased pain at this time in session  Hook lying: cues on technique, ex form - posterior pelvic tilt x 10 reps with small rolled towel under lower back;   - pelvic rocking anterior/posterior x 10 resps sitting on edge of mat; verbal cues provided for technique - single knee to chest stretch, 30 sec hold x 2 each side - double knee to chest stretch, 30 sec hold x 1 - passive straight leg hamstring stretch 30 sec x 2 each leg; pt. continues to be limited to ~ 30 degrees hip flexion bilaterally due to hamstring tightness.    -hip adduction in hooklying -hip adduction with passive overpressure supine position            [facility] Adult PT Treatment/Exercise - 11/22/14 1130    Transfers   Transfers Sit to Stand;Stand to Sit   Sit to Stand 7: Independent;5:  Supervision   Sit to Stand Details Verbal cues for technique;Tactile cues for posture   Sit to Stand Details (indicate cue type and reason) Pt. demo's proper technique and wt. shift but narrow BOS during sit>stand with no AD; sit<>stand performed for strengthening 2x10 reps; verbal cues provided for upright posture.     Number of Reps 10 reps;2 sets   Transfer Cueing Verbal cues provided for upright posture and widening of BOS during sit<>stand.                    PT Short Term Goals - 11/15/14 1455    PT SHORT TERM GOAL #1   Title The patient will be indep with HEP for LE strengthening, flexibility, and standing balance activities.   Baseline Target date 12/14/2014   Time 4   Period Weeks   PT SHORT TERM GOAL #2   Title The patient will improve Berg balance score from 37/56 to > or equal to 43/56 to demo decreasing risk for falls.   Baseline Target date 12/14/2014   Time 4   Period Weeks   PT SHORT TERM GOAL #3   Title The patient will increase gait speed from 2.07 ft/sec to > or equal to 2.62 ft/sec to demo improving functional community gait.   Baseline Target date 12/14/2014   Time 4   Period Weeks   PT SHORT TERM GOAL #4   Title The patient will demonstrate improved gait mechanics without toe scuffing (foot drag) x 230 feet nonstop for safer household mobility.   Baseline Target date 12/14/2014   Time 4   Period Weeks           PT Long Term Goals - 11/15/14 1458    PT LONG TERM GOAL #1   Title The patient will improve neuro Quality of life by > or equal to 12% to demonstrate improved subjective perception of function.   Baseline Target date 12/28/2014   Time 6   Period Weeks   PT LONG TERM GOAL #2   Title The patient will improve Berg from 37/56 to > or equal to 45/56 to demo decreased risk for falls.   Baseline Target date 12/28/2014   Time 6   Period Weeks   PT LONG TERM GOAL #3   Title The patinet will decrease TUG from 16.92 sec to < or equal to 14  seconds to demo decreased risk for falls.   Baseline Target date 12/28/2014  Time 6   Period Weeks   PT LONG TERM GOAL #4   Title The patient will negotiate community surfaces without a device independently (including grass, curbs, inclines) x 500 feet.   Baseline Target date 12/28/2014   Time 6   Period Weeks   PT LONG TERM GOAL #5   Title The patient will verbalize understanding of return to community exercise program for post d/c activities.   Baseline Target date 12/28/2014   Time 6   Period Weeks               Plan - 11/22/14 1134    Clinical Impression Statement Skilled session focused on stretching; to increase lumbopelvic flexibility and bilateral hamstrings/hip adductors to decrease pain and increase functional capacity.  Pt. continues to demo marked tightness throughout hips and LE's.  Pt. reported a decrease in pain after all stretching performed.  Reviewed home stretches for lower back today.  Pt. making steady progress toward goals.     Pt will benefit from skilled therapeutic intervention in order to improve on the following deficits Abnormal gait;Decreased balance;Decreased mobility;Decreased strength;Postural dysfunction;Difficulty walking;Decreased range of motion;Impaired flexibility;Decreased activity tolerance   Rehab Potential Good   PT Frequency 2x / week   PT Duration 6 weeks   PT Treatment/Interventions ADLs/Self Care Home Management;Balance training;Neuromuscular re-education;Gait training;Stair training;Functional mobility training;Therapeutic activities;Therapeutic exercise;Manual techniques;Patient/family education   PT Next Visit Plan check HEP, work on gait mechanics emphasizing heel strike bilaterally + knee extension at terminal swing phase, LE strengthening (esp L side), general balance/mobility.   Consulted and Agree with Plan of Care Patient        Problem List Patient Active Problem List   Diagnosis Date Noted  . Umbilical hernia  20/80/2233  . Asthma 10/08/2011  . Carotid stenosis 03/31/2011  . Chronic pain   . CAROTID BRUIT, LEFT 11/25/2009  . Vitamin D deficiency 12/11/2008  . HLD (hyperlipidemia) 12/11/2008  . ANEMIA 12/11/2008  . Cerebral artery occlusion with cerebral infarction ([place]) 12/11/2008  . URINARY INCONTINENCE, MALE 12/11/2008  . BURSITIS, LEFT SHOULDER 12/29/2007  . ANXIETY, MILD 08/09/2007  . HYPERTENSION, BENIGN ESSENTIAL 01/04/2007  . Allergic rhinitis 01/04/2007  . HEADACHE, TENSION 11/05/2006  . PROSTATE CANCER, HX OF 11/05/2006    Bess Harvest 11/22/2014, 11:50 AM  Bess Harvest, [place]  Name: OMER PUCCINELLI MRN: [id number] Date of Birth: 06-30-1929

## 2014-11-26 ENCOUNTER — Encounter: Payer: Self-pay | Admitting: Physical Therapy

## 2014-11-26 ENCOUNTER — Ambulatory Visit: Payer: Commercial Managed Care - HMO | Admitting: Physical Therapy

## 2014-11-26 DIAGNOSIS — R531 Weakness: Secondary | ICD-10-CM

## 2014-11-26 DIAGNOSIS — R269 Unspecified abnormalities of gait and mobility: Secondary | ICD-10-CM

## 2014-11-26 NOTE — Therapy (Signed)
[facility] [address] [place], [place], [address] Phone: [phone]   Fax:  [phone]  Physical Therapy Treatment  Patient Details  Name: [name] MRN: [id number] Date of Birth: 13-Feb-1929 Referring Provider: [name], MD  Encounter Date: 11/26/2014      PT End of Session - 11/26/14 1340    Visit Number 4   Number of Visits 12   Date for PT Re-Evaluation 12/28/14   Authorization Type G code every 10th visit $45 copay   PT Start Time 1232   PT Stop Time 1317   PT Time Calculation (min) 45 min   Equipment Utilized During Treatment Gait belt   Activity Tolerance Patient tolerated treatment well   Behavior During Therapy [facility] for tasks assessed/performed      Past Medical History  Diagnosis Date  . HTN (hypertension)     Negative renal duplex 07-22-11  . 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 Esophageal stricture     Distal, benign  . Diverticulosis   . GERD (gastroesophageal reflux disease)   . Hemorrhoids   . Allergic rhinitis   . Prostate cancer ([place]) 2000    Seed XRT  . Dyslipidemia   . Chronic pain syndrome     Left shoulder, back  . Carotid stenosis     a. s/p Left CEA 2002;  b. Carotid US 3/15:  patent L CEA, R < 40%  . Asthma   . Anemia   . Hx of echocardiogram     a. Echo 10/11: mod LVH, EF 55-60%  . Pneumonia 07-2013    Past Surgical History  Procedure Laterality Date  . Cervical disc surgery  8/07  . Lumbar disc surgery    . Appendectomy    . Cataract extraction Bilateral   . Insertion prostate radiation seed  2000  . Carotid endarterectomy  2002    L cea  . Nasal sinus surgery      x 3  . Carotid endarterectomy Right 01/2010    There were no vitals filed for this visit.  Visit Diagnosis:  Generalized weakness  Abnormality of gait      Subjective Assessment - 11/26/14 1338    Subjective Pt. reports LB pain as a 0/10 no pain.  Pt. reports  adherence to HEP performing once daily.     Currently in Pain? No/denies   Pain Score 0-No pain   Multiple Pain Sites No     Therapeutic Exercise (to increase strength and flexibility of the lumbopelvic complex to improve functional ability):  Reviewed pt's current Hep and provided cues as needed on correct ex form/technique. - hamstring stretch with foot propped on foot stool, 30 sec hold x 2 each leg; verbal cues provided for pt. to provided AAROM pressure through the knee to increase stretch.   Supine hook lying: cues on technique, ex form - posterior pelvic tilt 2 x 10 reps with small rolled towel under lower back;verbal cues provided for technique.   - pelvic rocking anterior/posterior x 10 reps sitting on edge of mat; verbal cues provided for technique - AAROM double knee to chest stretch, 30 sec hold x 1 - crunches for upper abdominals x 10 reps; verbal cues provided for technique  - alternating LE marching for lower abdominal strengthening; verbal cues provided for technique.Supine hook lying:  - passive knee to chest, (30 sec each leg);  Verbal cues provided to get pt. to relax  LE's.  - passive lower trunk rotation left<>right, 30sec holds each way; verbal cues provided to get pt. to relax LE's.  - passive straight leg hamstring stretch 30 sec x 2 each leg; pt. continues to be limited to ~ 30 degrees hip flexion bilaterally due to hamstring tightness.  - passive piriformis stretch x 30 sec each leg; Verbal cues provided to get pt. to relax LE's.  - hip adduction with passive overpressure supine position x 30 sec each; verbal cues provided to get pt. to relax LE's.   In Quadruped: - alternating UE raises x 10 each side; pt. required verbal cues for technique. - alternating LE raises x 10 each side; pt. required verbal cues for technique.          PT Short Term Goals - 11/15/14 1455    PT SHORT TERM GOAL #1   Title The patient will be indep with HEP for LE  strengthening, flexibility, and standing balance activities.   Baseline Target date 12/14/2014   Time 4   Period Weeks   PT SHORT TERM GOAL #2   Title The patient will improve Berg balance score from 37/56 to > or equal to 43/56 to demo decreasing risk for falls.   Baseline Target date 12/14/2014   Time 4   Period Weeks   PT SHORT TERM GOAL #3   Title The patient will increase gait speed from 2.07 ft/sec to > or equal to 2.62 ft/sec to demo improving functional community gait.   Baseline Target date 12/14/2014   Time 4   Period Weeks   PT SHORT TERM GOAL #4   Title The patient will demonstrate improved gait mechanics without toe scuffing (foot drag) x 230 feet nonstop for safer household mobility.   Baseline Target date 12/14/2014   Time 4   Period Weeks           PT Long Term Goals - 11/15/14 1458    PT LONG TERM GOAL #1   Title The patient will improve neuro Quality of life by > or equal to 12% to demonstrate improved subjective perception of function.   Baseline Target date 12/28/2014   Time 6   Period Weeks   PT LONG TERM GOAL #2   Title The patient will improve Berg from 37/56 to > or equal to 45/56 to demo decreased risk for falls.   Baseline Target date 12/28/2014   Time 6   Period Weeks   PT LONG TERM GOAL #3   Title The patinet will decrease TUG from 16.92 sec to < or equal to 14 seconds to demo decreased risk for falls.   Baseline Target date 12/28/2014   Time 6   Period Weeks   PT LONG TERM GOAL #4   Title The patient will negotiate community surfaces without a device independently (including grass, curbs, inclines) x 500 feet.   Baseline Target date 12/28/2014   Time 6   Period Weeks   PT LONG TERM GOAL #5   Title The patient will verbalize understanding of return to community exercise program for post d/c activities.   Baseline Target date 12/28/2014   Time 6   Period Weeks            Plan - 11/26/14 1341    Clinical Impression Statement Skilled  session focuesd on stretching and strengthening to increase bil LE and pelvic flexibility and strength.  Pt. pain greatly decreased since last visit.  pt. tolerated strengthening progression to supported quadruped  well with no increase in pain.         Pt will benefit from skilled therapeutic intervention in order to improve on the following deficits Abnormal gait;Decreased balance;Decreased mobility;Decreased strength;Postural dysfunction;Difficulty walking;Decreased range of motion;Impaired flexibility;Decreased activity tolerance   Rehab Potential Good   PT Frequency 2x / week   PT Duration 6 weeks   PT Treatment/Interventions ADLs/Self Care Home Management;Balance training;Neuromuscular re-education;Gait training;Stair training;Functional mobility training;Therapeutic activities;Therapeutic exercise;Manual techniques;Patient/family education   PT Next Visit Plan check HEP as needed, work on gait mechanics emphasizing heel strike bilaterally + knee extension at terminal swing phase, LE strengthening (esp L side), general balance/mobility.   Consulted and Agree with Plan of Care Patient        Problem List Patient Active Problem List   Diagnosis Date Noted  . Umbilical hernia 02/22/1550  . Asthma 10/08/2011  . Carotid stenosis 03/31/2011  . Chronic pain   . CAROTID BRUIT, LEFT 11/25/2009  . Vitamin D deficiency 12/11/2008  . HLD (hyperlipidemia) 12/11/2008  . ANEMIA 12/11/2008  . Cerebral artery occlusion with cerebral infarction ([place]) 12/11/2008  . URINARY INCONTINENCE, MALE 12/11/2008  . BURSITIS, LEFT SHOULDER 12/29/2007  . ANXIETY, MILD 08/09/2007  . HYPERTENSION, BENIGN ESSENTIAL 01/04/2007  . Allergic rhinitis 01/04/2007  . HEADACHE, TENSION 11/05/2006  . PROSTATE CANCER, HX OF 11/05/2006    Bess Harvest 11/27/2014, 10:42 AM  Bess Harvest, SPTA  Name: [name] MRN: [id number] Date of Birth: 11-23-1929  This note has been reviewed and edited by supervising  CI.  Willow Ora, PTA, [facility] [address] [place], [place] [address] [phone] 11/27/2014, 4:16 PM

## 2014-11-30 ENCOUNTER — Ambulatory Visit: Payer: Commercial Managed Care - HMO | Admitting: Rehabilitative and Restorative Service Providers"

## 2014-11-30 DIAGNOSIS — R269 Unspecified abnormalities of gait and mobility: Secondary | ICD-10-CM

## 2014-11-30 DIAGNOSIS — R531 Weakness: Secondary | ICD-10-CM | POA: Diagnosis not present

## 2014-11-30 NOTE — Therapy (Signed)
[facility] [address] [place], [place], [address] Phone: [phone]   Fax:  [phone]  Physical Therapy Treatment  Patient Details  Name: [name] MRN: [id number] Date of Birth: 09-Oct-1929 Referring Provider: Dietrich Pates, MD  Encounter Date: 11/30/2014      PT End of Session - 11/30/14 1543    Visit Number 5   Number of Visits 12   Date for PT Re-Evaluation 12/28/14   Authorization Type G code every 10th visit $45 copay   PT Start Time 1400   PT Stop Time 1445   PT Time Calculation (min) 45 min   Equipment Utilized During Treatment --   Activity Tolerance Patient tolerated treatment well   Behavior During Therapy [facility] for tasks assessed/performed      Past Medical History  Diagnosis Date  . HTN (hypertension)     Negative renal duplex 07-22-11  . 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 Esophageal stricture     Distal, benign  . Diverticulosis   . GERD (gastroesophageal reflux disease)   . Hemorrhoids   . Allergic rhinitis   . Prostate cancer ([facility]) 2000    Seed XRT  . Dyslipidemia   . Chronic pain syndrome     Left shoulder, back  . Carotid stenosis     a. s/p Left CEA 2002;  b. Carotid US 3/15:  patent L CEA, R < 40%  . Asthma   . Anemia   . Hx of echocardiogram     a. Echo 10/11: mod LVH, EF 55-60%  . Pneumonia 07-2013    Past Surgical History  Procedure Laterality Date  . Cervical disc surgery  8/07  . Lumbar disc surgery    . Appendectomy    . Cataract extraction Bilateral   . Insertion prostate radiation seed  2000  . Carotid endarterectomy  2002    L cea  . Nasal sinus surgery      x 3  . Carotid endarterectomy Right 01/2010    There were no vitals filed for this visit.  Visit Diagnosis:  Abnormality of gait  Generalized weakness      Subjective Assessment - 11/30/14 1359    Subjective The patient reports walking around the block this morning  for exercise.    He is doing HEP regularly. He reports his L foot has scuffed the ground since CVA in 2003.   Patient Stated Goals Patient hopes that walking and leg strength improve.   Currently in Pain? No/denies     THERAPEUTIC EXERCISE: Supine lumbar stretching with towel roll perpendicular to lumbar spine with trunk rocking/rotation Supine self mobilization thoracic spine with towel roll perpendicular and then directly under spine with UE reaching for anterior chest stretching Step-downs from 12" (2nd step at stairs in clinic) with UE support x 10 reps and CGA for safety *emphasized maintaining upright posture and pelvic position Thomas test hip flexor stretch with assist Standing heel cord/hamstring stretch at countertop Standing lumbar extension stretch at countertop   NEUROMUSCULAR RE-EDUCATION: Rocker board with posterior>anterior hip emphasis for hip strategy Sidestepping and return to middle with UEs abducted and ER for shoulder depression sit<>stand x 10 reps with UE postural cues  Weight shifting ant/post with feet in stride position reaching up when shifting weight anteriorly and bring UEs down when shifting posteriorly *emphasized knee extension and hip extension with translation of weight    Gait: Ambulation with emphasis on longer  stride with SBA for safety x 230 feet Gait emphasizing arm swing with tactile cues x 230 feet x 3 reps Backwards walking with min A emphasizing longer stride and knee extension with mid stance x 20 feet x 2 reps Heel and toe walking with min A x 20 feet x 2 reps each       PT Short Term Goals - 11/30/14 1544    PT SHORT TERM GOAL #1   Title The patient will be indep with HEP for LE strengthening, flexibility, and standing balance activities.   Baseline Target date 12/14/2014   Time 4   Period Weeks   PT SHORT TERM GOAL #2   Title The patient will improve Berg balance score from 37/56 to > or equal to 43/56 to demo decreasing risk for  falls.   Baseline Target date 12/14/2014   Time 4   Period Weeks   PT SHORT TERM GOAL #3   Title The patient will increase gait speed from 2.07 ft/sec to > or equal to 2.62 ft/sec to demo improving functional community gait.   Baseline Target date 12/14/2014   Time 4   Period Weeks   PT SHORT TERM GOAL #4   Title The patient will demonstrate improved gait mechanics without toe scuffing (foot drag) x 230 feet nonstop for safer household mobility.   Baseline *patient reports this has been gait pattern since stroke in 2003.  d/c goal.   Time 4   Period Weeks   Status Deferred           PT Long Term Goals - 11/30/14 1544    PT LONG TERM GOAL #1   Title The patient will improve neuro Quality of life by > or equal to 12% to demonstrate improved subjective perception of function.   Baseline Target date 12/28/2014   Time 6   Period Weeks   PT LONG TERM GOAL #2   Title The patient will improve Berg from 37/56 to > or equal to 45/56 to demo decreased risk for falls.   Baseline Target date 12/28/2014   Time 6   Period Weeks   PT LONG TERM GOAL #3   Title The patinet will decrease TUG from 16.92 sec to < or equal to 14 seconds to demo decreased risk for falls.   Baseline Target date 12/28/2014   Time 6   Period Weeks   PT LONG TERM GOAL #4   Title The patient will negotiate community surfaces without a device independently (including grass, curbs, inclines) x 500 feet.   Baseline Target date 12/28/2014   Time 6   Period Weeks   PT LONG TERM GOAL #5   Title The patient will verbalize understanding of return to community exercise program for post d/c activities.   Baseline Target date 12/28/2014   Time 6   Period Weeks               Plan - 11/30/14 1544    Clinical Impression Statement The patient had improved posture after mid thoracic and lumbar stretching.  Worked on integrating hip strategy and improved posture into balance activities for functional mobility.  PT to  continue to progress towards STGs.     PT Next Visit Plan Begin checking STGs, progress hip strategy activities emphasizing knee extension for upright posture, stretching as needed, gait mechanics/training.   Consulted and Agree with Plan of Care Patient        Problem List Patient Active Problem List  Diagnosis Date Noted  . Umbilical hernia 11/06/2011  . Asthma 10/08/2011  . Carotid stenosis 03/31/2011  . Chronic pain   . CAROTID BRUIT, LEFT 11/25/2009  . Vitamin D deficiency 12/11/2008  . HLD (hyperlipidemia) 12/11/2008  . ANEMIA 12/11/2008  . Cerebral artery occlusion with cerebral infarction ([facility]) 12/11/2008  . URINARY INCONTINENCE, MALE 12/11/2008  . BURSITIS, LEFT SHOULDER 12/29/2007  . ANXIETY, MILD 08/09/2007  . HYPERTENSION, BENIGN ESSENTIAL 01/04/2007  . Allergic rhinitis 01/04/2007  . HEADACHE, TENSION 11/05/2006  . PROSTATE CANCER, HX OF 11/05/2006    Andres Vest, PT 11/30/2014, 3:46 PM  [facility] [address] [place], [place], [address] Phone: [phone]   Fax:  [phone]  Name: [name] MRN: [id number] Date of Birth: 09/18/1929

## 2014-12-03 ENCOUNTER — Other Ambulatory Visit: Payer: Self-pay | Admitting: Internal Medicine

## 2014-12-04 ENCOUNTER — Encounter: Payer: Commercial Managed Care - HMO | Admitting: Rehabilitative and Restorative Service Providers"

## 2014-12-04 ENCOUNTER — Encounter: Payer: Commercial Managed Care - HMO | Admitting: Internal Medicine

## 2014-12-10 ENCOUNTER — Ambulatory Visit (INDEPENDENT_AMBULATORY_CARE_PROVIDER_SITE_OTHER): Payer: Commercial Managed Care - HMO | Admitting: Internal Medicine

## 2014-12-10 ENCOUNTER — Encounter: Payer: Self-pay | Admitting: Internal Medicine

## 2014-12-10 ENCOUNTER — Other Ambulatory Visit (INDEPENDENT_AMBULATORY_CARE_PROVIDER_SITE_OTHER): Payer: Commercial Managed Care - HMO

## 2014-12-10 VITALS — BP 120/60 | HR 65 | Temp 97.6°F | Ht 71.0 in | Wt 177.5 lb

## 2014-12-10 DIAGNOSIS — I10 Essential (primary) hypertension: Secondary | ICD-10-CM | POA: Diagnosis not present

## 2014-12-10 DIAGNOSIS — E785 Hyperlipidemia, unspecified: Secondary | ICD-10-CM

## 2014-12-10 DIAGNOSIS — Z23 Encounter for immunization: Secondary | ICD-10-CM | POA: Diagnosis not present

## 2014-12-10 DIAGNOSIS — I6350 Cerebral infarction due to unspecified occlusion or stenosis of unspecified cerebral artery: Secondary | ICD-10-CM

## 2014-12-10 DIAGNOSIS — I6521 Occlusion and stenosis of right carotid artery: Secondary | ICD-10-CM

## 2014-12-10 DIAGNOSIS — J4540 Moderate persistent asthma, uncomplicated: Secondary | ICD-10-CM

## 2014-12-10 DIAGNOSIS — Z0000 Encounter for general adult medical examination without abnormal findings: Secondary | ICD-10-CM

## 2014-12-10 LAB — LIPID PANEL
CHOL/HDL RATIO: 3
Cholesterol: 97 mg/dL (ref 0–200)
HDL: 32.4 mg/dL — ABNORMAL LOW (ref >39.00)
LDL Cholesterol: 48 mg/dL (ref 0–99)
NONHDL: 64.18
Triglycerides: 81 mg/dL (ref 0.0–149.0)
VLDL: 16.2 mg/dL (ref 0.0–40.0)

## 2014-12-10 NOTE — Assessment & Plan Note (Signed)
CVA with mild chronic LLE weakness since 2002 - ongoing OP PT since 11/2014 at request of wife for generalized weakness Continue medical mgmt of risk factors as ongoing - ASA + Plavix

## 2014-12-10 NOTE — Patient Instructions (Signed)
It was good to see you today.  We have reviewed your prior records including labs and tests today  Pneumovax updated today - other Health Maintenance reviewed - all recommended immunizations and age-appropriate screenings are up-to-date.  Test(s) ordered today. Your results will be released to [facility] (or called to you) after review, usually within 72hours after test completion. If any changes need to be made, you will be notified at that same time.  Medications reviewed and updated, no changes recommended at this time.  Please schedule followup in 6 months for semiannual exam and labs, call sooner if problems.

## 2014-12-10 NOTE — Progress Notes (Signed)
Subjective:    Patient ID: [name], male    DOB: Oct 26, 1929, 81 y.o.   MRN: [id number]  HPI   Here for annual medicare wellness  Diet: heart healthy Physical activity: sedentary Depression/mood screen: negative Hearing: intact to whispered voice Visual acuity: grossly normal, performs annual eye exam  ADLs: capable Fall risk: none Home safety: good Cognitive evaluation: intact to orientation, naming, recall and repetition EOL planning: adv directives, full code/ I agree  I have personally reviewed and have noted 1. The patient's medical and social history 2. Their use of alcohol, tobacco or illicit drugs 3. Their current medications and supplements 4. The patient's functional ability including ADL's, fall risks, home safety risks and hearing or visual impairment. 5. Diet and physical activities 6. Evidence for depression or mood disorders  Also reviewed chronic medical conditions, interval events and current concerns  Past Medical History  Diagnosis Date  . HTN (hypertension)     Negative renal duplex 07-22-11  . 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 Esophageal stricture     Distal, benign  . Diverticulosis   . GERD (gastroesophageal reflux disease)   . Hemorrhoids   . Allergic rhinitis   . Prostate cancer ([place]) 2000    Seed XRT  . Dyslipidemia   . Chronic pain syndrome     Left shoulder, back  . Carotid stenosis     a. s/p Left CEA 2002;  b. Carotid US 3/16:  patent R CEA, L < 40%  . Asthma   . Anemia   . Hx of echocardiogram     a. Echo 10/11: mod LVH, EF 55-60%  . Pneumonia 07-2013   Family History  Problem Relation Age of Onset  . Lung cancer Brother   . Stroke Sister    Social History  Substance Use Topics  . Smoking status: Former Smoker -- 0.25 packs/day for 35 years    Types: Cigarettes    Quit date: 01/12/1978  . Smokeless tobacco: Current User    Types: Chew  . Alcohol Use: No     Comment: Prior alcoholic,  sober since 4132    Review of Systems  Constitutional: Positive for fatigue. Negative for fever, activity change, appetite change and unexpected weight change.  Respiratory: Negative for cough, chest tightness, shortness of breath and wheezing.   Cardiovascular: Negative for chest pain, palpitations and leg swelling.  Musculoskeletal: Positive for arthralgias and neck pain (chronic r side with shoulder pain). Negative for joint swelling.  Neurological: Positive for weakness (generalized - in PT for same for past month). Negative for dizziness and headaches.  Psychiatric/Behavioral: Negative for dysphoric mood. The patient is not nervous/anxious.   All other systems reviewed and are negative.   Patient Care Team: Rowe Clack, MD as PCP - General (Internal Medicine) Fay Records, MD (Cardiology) Lindwood Coke, MD as Attending Physician (Dermatology) Bjorn Loser, MD as Attending Physician (Urology) Ninetta Lights, MD (Orthopedic Surgery) Rigoberto Noel, MD (Pulmonary Disease) Rosetta Posner, MD (Vascular Surgery) Katy Apo, MD (Ophthalmology)     Objective:    Physical Exam  Constitutional: He appears well-developed and well-nourished. No distress.  Wife at side  Cardiovascular: Normal rate, regular rhythm and normal heart sounds.   No murmur heard. Pulmonary/Chest: Effort normal and breath sounds normal. No respiratory distress.  Vitals reviewed.   BP 120/60 mmHg  Pulse 65  Temp(Src) 97.6 F (36.4 C) (Oral)  Ht '5\' 11"'$  (  1.803 m)  Wt 177 lb 8 oz (80.513 kg)  BMI 24.77 kg/m2  SpO2 97% Wt Readings from Last 3 Encounters:  12/10/14 177 lb 8 oz (80.513 kg)  10/18/14 174 lb (78.926 kg)  06/12/14 177 lb (80.287 kg)    Lab Results  Component Value Date   WBC 6.5 10/18/2014   HGB 12.6* 10/18/2014   HCT 37.2* 10/18/2014   PLT 242 10/18/2014   GLUCOSE 87 10/18/2014   CHOL 103 11/30/2013   TRIG 72.0 11/30/2013   HDL 38.70* 11/30/2013   LDLCALC 50 11/30/2013    ALT 10 10/18/2014   AST 17 10/18/2014   NA 139 10/18/2014   K 4.9 10/18/2014   CL 106 10/18/2014   CREATININE 0.99 10/18/2014   BUN 22 10/18/2014   CO2 26 10/18/2014   TSH 3.159 11/15/2014   PSA 0.02* 12/11/2008   INR 0.97 02/06/2010   HGBA1C 6.0* 10/18/2014    No results found.     Assessment & Plan:   AWV/z00.00 - Today patient counseled on age appropriate routine health concerns for screening and prevention, each reviewed and up to date or declined. Immunizations reviewed and up to date or declined. Labs ordered and reviewed. Risk factors for depression reviewed and negative. Hearing function and visual acuity are intact. ADLs screened and addressed as needed. Functional ability and level of safety reviewed and appropriate. Education, counseling and referrals performed based on assessed risks today. Patient provided with a copy of personalized plan for preventive services.  Problem List Items Addressed This Visit    Asthma    Mod dz but no recent exacerbation -  Follows with pulm for same - Symbicort BID most days and rare use of Alb Update pneumonia vax today      Relevant Medications   montelukast (SINGULAIR) 10 MG tablet   Carotid stenosis    R CEA 2002 following CVA Stable <40% L ICAS without restenosis in R on annual dopplers at [facility], last reviewed (03/2014) Continue medical management of same as ongoing      Relevant Medications   amLODipine (NORVASC) 5 MG tablet   Other Relevant Orders   Lipid panel   Cerebral artery occlusion with cerebral infarction [facility])    CVA with mild chronic LLE weakness since 2002 - ongoing OP PT since 11/2014 at request of wife for generalized weakness Continue medical mgmt of risk factors as ongoing - ASA + Plavix      Relevant Medications   amLODipine (NORVASC) 5 MG tablet   Other Relevant Orders   Lipid panel   HLD (hyperlipidemia)    Prev on simvastatin, changed to atorva for better efficacy  Check annually - titrate as  needed Reviewed history of stroke in 2001 and left CEA in 2002      Relevant Medications   amLODipine (NORVASC) 5 MG tablet   Other Relevant Orders   Lipid panel   HYPERTENSION, BENIGN ESSENTIAL    BP Readings from Last 3 Encounters:  12/10/14 120/60  10/18/14 108/50  06/12/14 98/50   The current medical regimen is effective;  continue present plan and medications.      Relevant Medications   amLODipine (NORVASC) 5 MG tablet   Other Relevant Orders   Lipid panel    Other Visit Diagnoses    Routine general medical examination at a health care facility    -  Primary        Gwendolyn Grant, MD

## 2014-12-10 NOTE — Assessment & Plan Note (Signed)
R CEA 2002 following CVA Stable <40% L ICAS without restenosis in R on annual dopplers at [facility], last reviewed (03/2014) Continue medical management of same as ongoing

## 2014-12-10 NOTE — Assessment & Plan Note (Signed)
Prev on simvastatin, changed to atorva for better efficacy  Check annually - titrate as needed Reviewed history of stroke in 2001 and left CEA in 2002

## 2014-12-10 NOTE — Assessment & Plan Note (Signed)
BP Readings from Last 3 Encounters:  12/10/14 120/60  10/18/14 108/50  06/12/14 98/50   The current medical regimen is effective;  continue present plan and medications.

## 2014-12-10 NOTE — Assessment & Plan Note (Signed)
Mod dz but no recent exacerbation -  Follows with pulm for same - Symbicort BID most days and rare use of Alb Update pneumonia vax today

## 2014-12-10 NOTE — Progress Notes (Signed)
Pre visit review using our clinic review tool, if applicable. No additional management support is needed unless otherwise documented below in the visit note. 

## 2014-12-11 ENCOUNTER — Encounter: Payer: Commercial Managed Care - HMO | Admitting: Physical Therapy

## 2014-12-12 ENCOUNTER — Encounter: Payer: Self-pay | Admitting: Physical Therapy

## 2014-12-12 ENCOUNTER — Ambulatory Visit: Payer: Commercial Managed Care - HMO | Admitting: Physical Therapy

## 2014-12-12 DIAGNOSIS — R531 Weakness: Secondary | ICD-10-CM

## 2014-12-12 DIAGNOSIS — R269 Unspecified abnormalities of gait and mobility: Secondary | ICD-10-CM | POA: Diagnosis not present

## 2014-12-12 NOTE — Patient Instructions (Signed)
Pt. Instructed on importance of HEP.  Pt. Instructed on proper technique with standing calf stretch in HEP.  Pt. returned demo showing understanding.

## 2014-12-12 NOTE — Therapy (Signed)
[facility] [address] [place], [place], [address] Phone: [phone]   Fax:  [phone]  Physical Therapy Treatment  Patient Details  Name: [name] MRN: [id number] Date of Birth: Aug 19, 1929 Referring Provider: [name], MD  Encounter Date: 12/12/2014      PT End of Session - 12/12/14 1016    Visit Number 6   Number of Visits 12   Date for PT Re-Evaluation 12/28/14   Authorization Type G code every 10th visit $45 copay   PT Start Time 0931   PT Stop Time 1000   PT Time Calculation (min) 29 min   Equipment Utilized During Treatment Gait belt   Activity Tolerance Patient tolerated treatment well   Behavior During Therapy [facility] for tasks assessed/performed      Past Medical History  Diagnosis Date  . HTN (hypertension)     Negative renal duplex 07-22-11  . 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 Esophageal stricture     Distal, benign  . Diverticulosis   . GERD (gastroesophageal reflux disease)   . Hemorrhoids   . Allergic rhinitis   . Prostate cancer ([place]) 2000    Seed XRT  . Dyslipidemia   . Chronic pain syndrome     Left shoulder, back  . Carotid stenosis     a. s/p Left CEA 2002;  b. Carotid US 3/16:  patent R CEA, L < 40%  . Asthma   . Anemia   . Hx of echocardiogram     a. Echo 10/11: mod LVH, EF 55-60%  . Pneumonia 07-2013    Past Surgical History  Procedure Laterality Date  . Cervical disc surgery  8/07  . Lumbar disc surgery    . Appendectomy    . Cataract extraction Bilateral   . Insertion prostate radiation seed  2000  . Nasal sinus surgery      x 3  . Carotid endarterectomy Right 01/2000    There were no vitals filed for this visit.  Visit Diagnosis:  Generalized weakness  Abnormality of gait      Subjective Assessment - 12/12/14 1009    Subjective Pt. reports daily adherence to HEP and no pain or other complaints.     Currently in Pain?  No/denies   Multiple Pain Sites No     Therex and HEP review (to increase pt. Independence with HEP at home; to increase LE strength, flexibility, and activity tolerance): Reviewed pt's current HEP and provided cues as needed on correct ex form/technique. seated edge of mat - sit<>stands x 5 reps with cues to increase base of support  Stretching (to increase LE flexibility and lumbopelvic pain): - single knee to chest stretch, 20 sec hold x 3 each side - double knee to chest stretch, 30 sec hold x 3 each side - lower trunk rotation left<>right, 20 sec holds x 3 each way - hamstring stretch with foot propped on foot stool, 30 sec hold x 3 each leg - standing calf stretch x 30 sec each side; verbal cues provided for proper technique.         [facility] Adult PT Treatment/Exercise - 12/12/14 1011    Ambulation/Gait   Ambulation/Gait Yes   Ambulation/Gait Assistance 5: Supervision   Ambulation/Gait Assistance Details Pt. ambulated with no AD 207 ft with supervision; verbal cues provided upright posture.     Ambulation Distance (Feet) 207 Feet   Assistive device None  Gait Pattern Decreased dorsiflexion - right;Decreased dorsiflexion - left;Decreased stride length;Decreased step length - left;Decreased step length - right;Decreased arm swing - right;Decreased arm swing - left;Right foot flat;Left foot flat   Ambulation Surface Level;Indoor   Gait velocity 3.26 ft/sec   Stairs No   Ramp Not tested (comment)   Curb Not tested (comment)   Berg Balance Test   Sit to Stand Able to stand without using hands and stabilize independently   Standing Unsupported Able to stand safely 2 minutes   Sitting with Back Unsupported but Feet Supported on Floor or Stool Able to sit safely and securely 2 minutes   Stand to Sit Sits safely with minimal use of hands   Transfers Able to transfer safely, minor use of hands   Standing Unsupported with Eyes Closed Able to stand 10 seconds with supervision    Standing Ubsupported with Feet Together Able to place feet together independently and stand 1 minute safely   From Standing, Reach Forward with Outstretched Arm Can reach forward >12 cm safely (5")   From Standing Position, Pick up Object from Floor Able to pick up shoe, needs supervision   From Standing Position, Turn to Look Behind Over each Shoulder Looks behind one side only/other side shows less weight shift   Turn 360 Degrees Able to turn 360 degrees safely one side only in 4 seconds or less   Standing Unsupported, Alternately Place Feet on Step/Stool Able to stand independently and safely and complete 8 steps in 20 seconds   Standing Unsupported, One Foot in Front Able to plae foot ahead of the other independently and hold 30 seconds   Standing on One Leg Able to lift leg independently and hold > 10 seconds   Total Score 50             PT Short Term Goals - 12/12/14 1017    PT SHORT TERM GOAL #1   Title The patient will be indep with HEP for LE strengthening, flexibility, and standing balance activities.   Baseline 12/12/2014: Pt. supervision with HEP requiring further instruction.     Time 4   Period Weeks   Status Not Met   PT SHORT TERM GOAL #2   Title The patient will improve Berg balance score from 37/56 to > or equal to 43/56 to demo decreasing risk for falls.   Baseline 12/12/2014: Pt. improved berg balance score from 37/56 to 50/56.     Time 4   Period Weeks   Status Achieved   PT SHORT TERM GOAL #3   Title The patient will increase gait speed from 2.07 ft/sec to > or equal to 2.62 ft/sec to demo improving functional community gait.   Baseline 12/12/2014: Pt. gait speed 3.26 ft / sec.     Time 4   Period Weeks   Status Achieved   PT SHORT TERM GOAL #4   Title The patient will demonstrate improved gait mechanics without toe scuffing (foot drag) x 230 feet nonstop for safer household mobility.   Baseline *patient reports this has been gait pattern since stroke in  2003.  d/c goal.   Time 4   Period Weeks   Status Deferred           PT Long Term Goals - 11/30/14 1544    PT LONG TERM GOAL #1   Title The patient will improve neuro Quality of life by > or equal to 12% to demonstrate improved subjective perception of function.   Baseline  Target date 12/28/2014   Time 6   Period Weeks   PT LONG TERM GOAL #2   Title The patient will improve Berg from 37/56 to > or equal to 45/56 to demo decreased risk for falls.   Baseline Target date 12/28/2014   Time 6   Period Weeks   PT LONG TERM GOAL #3   Title The patinet will decrease TUG from 16.92 sec to < or equal to 14 seconds to demo decreased risk for falls.   Baseline Target date 12/28/2014   Time 6   Period Weeks   PT LONG TERM GOAL #4   Title The patient will negotiate community surfaces without a device independently (including grass, curbs, inclines) x 500 feet.   Baseline Target date 12/28/2014   Time 6   Period Weeks   PT LONG TERM GOAL #5   Title The patient will verbalize understanding of return to community exercise program for post d/c activities.   Baseline Target date 12/28/2014   Time 6   Period Weeks           Plan - 12/12/14 1030    Clinical Impression Statement Pt. demo'd significant increase in gait speed and Berg Balance score of 50/56 in todays session.  Pt. progressing toward LTGs.  HEP reviewed in session with correction on exercise technique x 1.  Pt. otherwise independence with HEP.     PT Next Visit Plan Progress hip strategy activities emphasizing knee extension for upright posture, stretching as needed, gait mechanics/training.  Progress toward goals.     Consulted and Agree with Plan of Care Patient       Problem List Patient Active Problem List   Diagnosis Date Noted  . Umbilical hernia 83/81/8403  . Asthma 10/08/2011  . Carotid stenosis 03/31/2011  . Chronic pain   . Vitamin D deficiency 12/11/2008  . HLD (hyperlipidemia) 12/11/2008  . ANEMIA  12/11/2008  . Cerebral artery occlusion with cerebral infarction ([place]) 12/11/2008  . URINARY INCONTINENCE, MALE 12/11/2008  . BURSITIS, LEFT SHOULDER 12/29/2007  . ANXIETY, MILD 08/09/2007  . HYPERTENSION, BENIGN ESSENTIAL 01/04/2007  . Allergic rhinitis 01/04/2007  . HEADACHE, TENSION 11/05/2006  . PROSTATE CANCER, HX OF 11/05/2006    Bess Harvest 12/12/2014, 10:24 AM  Bess Harvest, [place]  Name: [name] MRN: [id number] Date of Birth: 1929/03/14  This note has been reviewed and edited by supervising CI.  Willow Ora, PTA, [place] [address], [place], [place] [address] [phone] 12/13/2014, 3:16 PM

## 2014-12-13 ENCOUNTER — Encounter: Payer: Commercial Managed Care - HMO | Admitting: Physical Therapy

## 2014-12-17 ENCOUNTER — Ambulatory Visit: Payer: Commercial Managed Care - HMO | Attending: Internal Medicine | Admitting: Physical Therapy

## 2014-12-17 ENCOUNTER — Ambulatory Visit: Payer: Commercial Managed Care - HMO | Admitting: Adult Health

## 2014-12-17 ENCOUNTER — Encounter: Payer: Self-pay | Admitting: Physical Therapy

## 2014-12-17 DIAGNOSIS — R269 Unspecified abnormalities of gait and mobility: Secondary | ICD-10-CM | POA: Insufficient documentation

## 2014-12-17 DIAGNOSIS — R531 Weakness: Secondary | ICD-10-CM | POA: Diagnosis not present

## 2014-12-17 NOTE — Therapy (Signed)
[facility] [address] [place], [place], [address] Phone: [phone]   Fax:  [phone]  Physical Therapy Treatment  Patient Details  Name: [name] MRN: [id number] Date of Birth: 1929/10/28 Referring Provider: [name], MD  Encounter Date: 12/17/2014      PT End of Session - 12/17/14 1524    Visit Number 7   Number of Visits 12   Date for PT Re-Evaluation 12/28/14   Authorization Type G code every 10th visit $45 copay   PT Start Time 1016   PT Stop Time 1100   PT Time Calculation (min) 44 min   Equipment Utilized During Treatment Gait belt   Activity Tolerance Patient tolerated treatment well   Behavior During Therapy [facility] for tasks assessed/performed      Past Medical History  Diagnosis Date  . HTN (hypertension)     Negative renal duplex 07-22-11  . 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 Esophageal stricture     Distal, benign  . Diverticulosis   . GERD (gastroesophageal reflux disease)   . Hemorrhoids   . Allergic rhinitis   . Prostate cancer ([place]) 2000    Seed XRT  . Dyslipidemia   . Chronic pain syndrome     Left shoulder, back  . Carotid stenosis     a. s/p Left CEA 2002;  b. Carotid US 3/16:  patent R CEA, L < 40%  . Asthma   . Anemia   . Hx of echocardiogram     a. Echo 10/11: mod LVH, EF 55-60%  . Pneumonia 07-2013    Past Surgical History  Procedure Laterality Date  . Cervical disc surgery  8/07  . Lumbar disc surgery    . Appendectomy    . Cataract extraction Bilateral   . Insertion prostate radiation seed  2000  . Nasal sinus surgery      x 3  . Carotid endarterectomy Right 01/2000    There were no vitals filed for this visit.  Visit Diagnosis:  Generalized weakness  Abnormality of gait      Subjective Assessment - 12/17/14 1521    Subjective No pain, falls, or other complaints reported.    Patient Stated Goals Patient hopes that walking  and leg strength improve.   Currently in Pain? No/denies   Pain Score 0-No pain   Multiple Pain Sites No      Therex (to increase LE flexibility, strength, and activity tolerance): - single knee to chest PROM R and L - sitting hamstring PROM 2 x 30 sec; verbal cues provided for technique. - Modified piriformis AAROM x 30 sec each leg; verbal cues provided for technique.  Neuro re-ed (to increase neuromuscular facilitation, strength, and coordination): - alternating step over black foam beam for terminal knee extension 2 x 10 reps; verbal cues provided for technique. - step up sideways on 4" platform 4 x 5 reps; with emphasis on eccentric lowering with heel contact; ; verbal cues provided for technique. Red mat: - high knee march forward x 4 laps down/back; verbal cues provided for technique. - tandem walk forward x 4 laps down/back; verbal cues provided for technique. - high knee side stepping 4 laps down/back; verbal cues provided for technique.       PT Short Term Goals - 12/12/14 1017    PT SHORT TERM GOAL #1   Title The patient will be indep with HEP for LE strengthening, flexibility,  and standing balance activities.   Baseline 12/12/2014: Pt. supervision with HEP requiring further instruction.     Time 4   Period Weeks   Status Not Met   PT SHORT TERM GOAL #2   Title The patient will improve Berg balance score from 37/56 to > or equal to 43/56 to demo decreasing risk for falls.   Baseline 12/12/2014: Pt. improved berg balance score from 37/56 to 50/56.     Time 4   Period Weeks   Status Achieved   PT SHORT TERM GOAL #3   Title The patient will increase gait speed from 2.07 ft/sec to > or equal to 2.62 ft/sec to demo improving functional community gait.   Baseline 12/12/2014: Pt. gait speed 3.26 ft / sec.     Time 4   Period Weeks   Status Achieved   PT SHORT TERM GOAL #4   Title The patient will demonstrate improved gait mechanics without toe scuffing (foot drag) x 230  feet nonstop for safer household mobility.   Baseline *patient reports this has been gait pattern since stroke in 2003.  d/c goal.   Time 4   Period Weeks   Status Deferred           PT Long Term Goals - 11/30/14 1544    PT LONG TERM GOAL #1   Title The patient will improve neuro Quality of life by > or equal to 12% to demonstrate improved subjective perception of function.   Baseline Target date 12/28/2014   Time 6   Period Weeks   PT LONG TERM GOAL #2   Title The patient will improve Berg from 37/56 to > or equal to 45/56 to demo decreased risk for falls.   Baseline Target date 12/28/2014   Time 6   Period Weeks   PT LONG TERM GOAL #3   Title The patinet will decrease TUG from 16.92 sec to < or equal to 14 seconds to demo decreased risk for falls.   Baseline Target date 12/28/2014   Time 6   Period Weeks   PT LONG TERM GOAL #4   Title The patient will negotiate community surfaces without a device independently (including grass, curbs, inclines) x 500 feet.   Baseline Target date 12/28/2014   Time 6   Period Weeks   PT LONG TERM GOAL #5   Title The patient will verbalize understanding of return to community exercise program for post d/c activities.   Baseline Target date 12/28/2014   Time 6   Period Weeks               Plan - 12/17/14 1526    Clinical Impression Statement Pt. tolerated single and tandem stance balance activities well showing only minimal exertion with the RLE following terminal knee extension staggered stance.  pt. also demo'd improved LE clearance during gait.  Pt. progressing toward goals.      PT Next Visit Plan Progress hip strategy activities emphasizing knee extension for upright posture, stretching as needed, gait mechanics/training.  Progress toward goals.     Consulted and Agree with Plan of Care Patient        Problem List Patient Active Problem List   Diagnosis Date Noted  . Umbilical hernia 15/40/0867  . Asthma 10/08/2011  .  Carotid stenosis 03/31/2011  . Chronic pain   . Vitamin D deficiency 12/11/2008  . HLD (hyperlipidemia) 12/11/2008  . ANEMIA 12/11/2008  . Cerebral artery occlusion with cerebral infarction ([place]) 12/11/2008  .  URINARY INCONTINENCE, MALE 12/11/2008  . BURSITIS, LEFT SHOULDER 12/29/2007  . ANXIETY, MILD 08/09/2007  . HYPERTENSION, BENIGN ESSENTIAL 01/04/2007  . Allergic rhinitis 01/04/2007  . HEADACHE, TENSION 11/05/2006  . PROSTATE CANCER, HX OF 11/05/2006    Bess Harvest 12/17/2014, 3:40 PM  Bess Harvest, [place]  Name: [name] MRN: [id number] Date of Birth: 10-Jun-1929  This note has been reviewed and edited by supervising CI.  Willow Ora, PTA, [facility] [address] [place], [place] [address] [phone] 12/18/2014, 2:36 PM

## 2014-12-18 ENCOUNTER — Other Ambulatory Visit: Payer: Self-pay | Admitting: Pulmonary Disease

## 2014-12-18 ENCOUNTER — Other Ambulatory Visit: Payer: Self-pay | Admitting: Internal Medicine

## 2014-12-19 ENCOUNTER — Ambulatory Visit: Payer: Commercial Managed Care - HMO | Admitting: Physical Therapy

## 2014-12-19 ENCOUNTER — Encounter: Payer: Self-pay | Admitting: Physical Therapy

## 2014-12-19 ENCOUNTER — Encounter: Payer: Commercial Managed Care - HMO | Admitting: Rehabilitative and Restorative Service Providers"

## 2014-12-19 DIAGNOSIS — R269 Unspecified abnormalities of gait and mobility: Secondary | ICD-10-CM

## 2014-12-19 DIAGNOSIS — R531 Weakness: Secondary | ICD-10-CM | POA: Diagnosis not present

## 2014-12-19 NOTE — Therapy (Signed)
[facility] [address] [place], [place], [address] Phone: [phone]   Fax:  [phone]  Physical Therapy Treatment  Patient Details  Name: [name] MRN: [id number] Date of Birth: [birth year] Referring Provider: [name], MD  Encounter Date: 12/19/2014      PT End of Session - 12/19/14 1525    Visit Number 8   Number of Visits 12   Date for PT Re-Evaluation 12/28/14   Authorization Type G code every 10th visit $45 copay   PT Start Time 1316   PT Stop Time 1400   PT Time Calculation (min) 44 min   Equipment Utilized During Treatment Gait belt   Activity Tolerance Patient tolerated treatment well   Behavior During Therapy [facility] for tasks assessed/performed      Past Medical History  Diagnosis Date  . HTN (hypertension)     Negative renal duplex 07-22-11  . 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 Esophageal stricture     Distal, benign  . Diverticulosis   . GERD (gastroesophageal reflux disease)   . Hemorrhoids   . Allergic rhinitis   . Prostate cancer ([place]) 2000    Seed XRT  . Dyslipidemia   . Chronic pain syndrome     Left shoulder, back  . Carotid stenosis     a. s/p Left CEA 2002;  b. Carotid US 3/16:  patent R CEA, L < 40%  . Asthma   . Anemia   . Hx of echocardiogram     a. Echo 10/11: mod LVH, EF 55-60%  . Pneumonia 07-2013    Past Surgical History  Procedure Laterality Date  . Cervical disc surgery  8/07  . Lumbar disc surgery    . Appendectomy    . Cataract extraction Bilateral   . Insertion prostate radiation seed  2000  . Nasal sinus surgery      x 3  . Carotid endarterectomy Right 01/2000    There were no vitals filed for this visit.  Visit Diagnosis:  Generalized weakness  Abnormality of gait      Subjective Assessment - 12/19/14 1523    Subjective No pain, falls, or other complaints reported.    Patient Stated Goals Patient hopes that walking  and leg strength improve.   Currently in Pain? No/denies   Pain Score 0-No pain   Multiple Pain Sites No      Therex (to increase LE flexibility, strength, and activity tolerance): - sitting hamstring PROM x 30 sec L and R; verbal cues provided for technique.  Neuro re-ed (to increase neuromuscular facilitation, strength, and coordination): - alternating step over black foam beam for terminal knee extension 2 x 10 reps; verbal cues provided for technique. - eccentric step downs from 4" platform 2 x 10 reps R and L next to parallel bars; 1 UE support; with emphasis on eccentric lowering with heel contact; ; verbal cues provided for technique. Red mat: - high knee march forward x 4 laps down/back; verbal cues provided for technique. - tandem walk forward x 4 laps down/back; verbal cues provided for technique. - high knee side stepping 4 laps down/back; verbal cues provided for technique. - high knee side-stepping 2 laps down/back; emphasis on long strides; verbal cues provided for technique.   In parallel bars  - hip abduction x 10 reps each leg; verbal cues provided on technique. - hip extension x 10 reps each leg; verbal cues provided  on technique - tandem walk on blue foam beam x 2 laps down/back; 1 UE support.  side stepping on blue foam beam x 2 laps down/back; 1 UE support.    - SciFit; 6 min / 2.0 intensity / 60 rpms for activity tolerance and strengthening. Cues for full range of motion to promote stretching as well.          PT Short Term Goals - 12/12/14 1017    PT SHORT TERM GOAL #1   Title The patient will be indep with HEP for LE strengthening, flexibility, and standing balance activities.   Baseline 12/12/2014: Pt. supervision with HEP requiring further instruction.     Time 4   Period Weeks   Status Not Met   PT SHORT TERM GOAL #2   Title The patient will improve Berg balance score from 37/56 to > or equal to 43/56 to demo decreasing risk for falls.   Baseline  12/12/2014: Pt. improved berg balance score from 37/56 to 50/56.     Time 4   Period Weeks   Status Achieved   PT SHORT TERM GOAL #3   Title The patient will increase gait speed from 2.07 ft/sec to > or equal to 2.62 ft/sec to demo improving functional community gait.   Baseline 12/12/2014: Pt. gait speed 3.26 ft / sec.     Time 4   Period Weeks   Status Achieved   PT SHORT TERM GOAL #4   Title The patient will demonstrate improved gait mechanics without toe scuffing (foot drag) x 230 feet nonstop for safer household mobility.   Baseline *patient reports this has been gait pattern since stroke in 2003.  d/c goal.   Time 4   Period Weeks   Status Deferred           PT Long Term Goals - 11/30/14 1544    PT LONG TERM GOAL #1   Title The patient will improve neuro Quality of life by > or equal to 12% to demonstrate improved subjective perception of function.   Baseline Target date 12/28/2014   Time 6   Period Weeks   PT LONG TERM GOAL #2   Title The patient will improve Berg from 37/56 to > or equal to 45/56 to demo decreased risk for falls.   Baseline Target date 12/28/2014   Time 6   Period Weeks   PT LONG TERM GOAL #3   Title The patinet will decrease TUG from 16.92 sec to < or equal to 14 seconds to demo decreased risk for falls.   Baseline Target date 12/28/2014   Time 6   Period Weeks   PT LONG TERM GOAL #4   Title The patient will negotiate community surfaces without a device independently (including grass, curbs, inclines) x 500 feet.   Baseline Target date 12/28/2014   Time 6   Period Weeks   PT LONG TERM GOAL #5   Title The patient will verbalize understanding of return to community exercise program for post d/c activities.   Baseline Target date 12/28/2014   Time 6   Period Weeks            Plan - 12/19/14 1526    Clinical Impression Statement Pt. tolerated dynamic gait and LE strengthening well showing only minimal fatigue.  Pt. complained of lower back  pain following therex however states this to be an ongoing problem.  Pt. progressing toward goals.     PT Next Visit Plan Progress hip strategy  activities emphasizing knee extension for upright posture, stretching as needed, gait mechanics/training.  Progress corner balance to staggered stance, eyes closed with head turns.  Progress toward goals.     Consulted and Agree with Plan of Care Patient        Problem List Patient Active Problem List   Diagnosis Date Noted  . Umbilical hernia 46/50/3546  . Asthma 10/08/2011  . Carotid stenosis 03/31/2011  . Chronic pain   . Vitamin D deficiency 12/11/2008  . HLD (hyperlipidemia) 12/11/2008  . ANEMIA 12/11/2008  . Cerebral artery occlusion with cerebral infarction ([place]) 12/11/2008  . URINARY INCONTINENCE, MALE 12/11/2008  . BURSITIS, LEFT SHOULDER 12/29/2007  . ANXIETY, MILD 08/09/2007  . HYPERTENSION, BENIGN ESSENTIAL 01/04/2007  . Allergic rhinitis 01/04/2007  . HEADACHE, TENSION 11/05/2006  . PROSTATE CANCER, HX OF 11/05/2006    [name] 12/19/2014, 3:30 PM  [name], [place]   Name: [name] MRN: [id number] Date of Birth: 1929/02/12  This note has been reviewed and edited by supervising CI.  Willow Ora, PTA, [facility] [address] [place], [place] [address] [phone] 12/20/2014, 3:11 PM

## 2014-12-19 NOTE — Patient Instructions (Signed)
Pt. Instructed on importance of continued activity after conclusion of physical therapy.  Pt. verbalized intention to join [employer] program at [employer].

## 2014-12-26 ENCOUNTER — Ambulatory Visit: Payer: Commercial Managed Care - HMO | Admitting: Rehabilitative and Restorative Service Providers"

## 2014-12-31 ENCOUNTER — Telehealth: Payer: Self-pay | Admitting: Internal Medicine

## 2014-12-31 NOTE — Telephone Encounter (Signed)
New problem    [name] returning call.

## 2015-01-01 NOTE — Telephone Encounter (Signed)
Spoke with patient's wife. No one left her a message, the number came up on their caller ID. I do not see documented anywhere as to a reason why we would have called the patient. She states he is fine and they do not need anything at this time.

## 2015-01-02 ENCOUNTER — Encounter: Payer: Self-pay | Admitting: Physical Therapy

## 2015-01-02 ENCOUNTER — Ambulatory Visit: Payer: Commercial Managed Care - HMO | Admitting: Physical Therapy

## 2015-01-02 DIAGNOSIS — R269 Unspecified abnormalities of gait and mobility: Secondary | ICD-10-CM

## 2015-01-02 DIAGNOSIS — R531 Weakness: Secondary | ICD-10-CM | POA: Diagnosis not present

## 2015-01-02 NOTE — Therapy (Signed)
[facility] [address] [place], [place], [address] Phone: [phone]   Fax:  [phone]  Physical Therapy Treatment  Patient Details  Name: [name] MRN: [id number] Date of Birth: 11-06-1929 Referring Provider: [name], MD  Encounter Date: 01/02/2015   01/02/15 1239  PT Visits / Re-Eval  Visit Number 9  Number of Visits 12  Date for PT Re-Evaluation 01/12/15 (plan of care goes unitl 12/31, date changed)  Authorization  Authorization Type G code every 10th visit $45 copay  PT Time Calculation  PT Start Time 1233  PT Stop Time 1314  PT Time Calculation (min) 41 min  PT - End of Session  Equipment Utilized During Treatment Gait belt  Activity Tolerance Patient tolerated treatment well  Behavior During Therapy [facility] for tasks assessed/performed     Past Medical History  Diagnosis Date  . HTN (hypertension)     Negative renal duplex 07-22-11  . 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 Esophageal stricture     Distal, benign  . Diverticulosis   . GERD (gastroesophageal reflux disease)   . Hemorrhoids   . Allergic rhinitis   . Prostate cancer ([place]) 2000    Seed XRT  . Dyslipidemia   . Chronic pain syndrome     Left shoulder, back  . Carotid stenosis     a. s/p Left CEA 2002;  b. Carotid US 3/16:  patent R CEA, L < 40%  . Asthma   . Anemia   . Hx of echocardiogram     a. Echo 10/11: mod LVH, EF 55-60%  . Pneumonia 07-2013    Past Surgical History  Procedure Laterality Date  . Cervical disc surgery  8/07  . Lumbar disc surgery    . Appendectomy    . Cataract extraction Bilateral   . Insertion prostate radiation seed  2000  . Nasal sinus surgery      x 3  . Carotid endarterectomy Right 01/2000    There were no vitals filed for this visit.  Visit Diagnosis:   Generalized weakness    Abnormality of gait        Subjective Assessment - 01/02/15 1238    Subjective No falls or pain to report. Does report his left leg is giving out more lately. Joined [employer] and starts back up with them next week.   Currently in Pain? No/denies   Pain Score 0-No pain     Treatment: Gait: 440 feet x 1, no AD, supervision. Cues for increase stride length and for increase stance time on left side (pt with antalgic gait pattern).  440 feet x 1, no AD, supervision after stretching below. Improved stride length noted, still with left knee flex stance, antalgic gait pattern.  Exercises: Passive stretching": bil legs for hamstrings, hip adductors and hip abductors. Prolonged holds with manual overpressure/assist to maintain knee extension with stretching.   Neuro Re-ed: Blue foam beam: light UE touch to bars - Alternating fwd foot taps and backward toe taps x 10 each with cues on posture, weight shifting and ex technique.  Balance board: forward/backward directions with no UE support to light UE support for balance. - static hold no head movements with eyes open - rocking with head still, eyes open - static hold with eyes closed, no head movements - static hold with eyes closed, head nods/shakes        PT Short Term Goals - 12/12/14  [place] #1   Title The patient will be indep with HEP for LE strengthening, flexibility, and standing balance activities.   Baseline 12/12/2014: Pt. supervision with HEP requiring further instruction.     Time 4   Period Weeks   Status Not Met   PT SHORT TERM GOAL #2   Title The patient will improve Berg balance score from 37/56 to > or equal to 43/56 to demo decreasing risk for falls.   Baseline 12/12/2014: Pt. improved berg balance score from 37/56 to 50/56.     Time 4   Period Weeks   Status Achieved   PT SHORT TERM GOAL #3   Title The patient will increase gait speed from 2.07 ft/sec to > or equal to 2.62 ft/sec to demo improving functional community gait.   Baseline 12/12/2014: Pt. gait  speed 3.26 ft / sec.     Time 4   Period Weeks   Status Achieved   PT SHORT TERM GOAL #4   Title The patient will demonstrate improved gait mechanics without toe scuffing (foot drag) x 230 feet nonstop for safer household mobility.   Baseline *patient reports this has been gait pattern since stroke in 2003.  d/c goal.   Time 4   Period Weeks   Status Deferred           PT Long Term Goals - 11/30/14 1544    PT LONG TERM GOAL #1   Title The patient will improve neuro Quality of life by > or equal to 12% to demonstrate improved subjective perception of function.   Baseline Target date 12/28/2014   Time 6   Period Weeks   PT LONG TERM GOAL #2   Title The patient will improve Berg from 37/56 to > or equal to 45/56 to demo decreased risk for falls.   Baseline Target date 12/28/2014   Time 6   Period Weeks   PT LONG TERM GOAL #3   Title The patinet will decrease TUG from 16.92 sec to < or equal to 14 seconds to demo decreased risk for falls.   Baseline Target date 12/28/2014   Time 6   Period Weeks   PT LONG TERM GOAL #4   Title The patient will negotiate community surfaces without a device independently (including grass, curbs, inclines) x 500 feet.   Baseline Target date 12/28/2014   Time 6   Period Weeks   PT LONG TERM GOAL #5   Title The patient will verbalize understanding of return to community exercise program for post d/c activities.   Baseline Target date 12/28/2014   Time 6   Period Weeks        01/02/15 1240  Plan  Clinical Impression Statement Worked on LE stretching and balance today without any issues reported. Pt making steady progress toward goals.  Pt will benefit from skilled therapeutic intervention in order to improve on the following deficits Abnormal gait;Decreased balance;Decreased mobility;Decreased strength;Postural dysfunction;Difficulty walking;Decreased range of motion;Impaired flexibility;Decreased activity tolerance  Rehab Potential Good  PT  Frequency 2x / week  PT Duration 6 weeks  PT Treatment/Interventions ADLs/Self Care Home Management;Balance training;Neuromuscular re-education;Gait training;Stair training;Functional mobility training;Therapeutic activities;Therapeutic exercise;Manual techniques;Patient/family education  PT Next Visit Plan assess goals; g-code next visist  Consulted and Agree with Plan of Care Patient    Problem List Patient Active Problem List   Diagnosis Date Noted  . Umbilical hernia 32/20/2542  . Asthma 10/08/2011  . Carotid stenosis 03/31/2011  .  Chronic pain   . Vitamin D deficiency 12/11/2008  . HLD (hyperlipidemia) 12/11/2008  . ANEMIA 12/11/2008  . Cerebral artery occlusion with cerebral infarction ([place]) 12/11/2008  . URINARY INCONTINENCE, MALE 12/11/2008  . BURSITIS, LEFT SHOULDER 12/29/2007  . ANXIETY, MILD 08/09/2007  . HYPERTENSION, BENIGN ESSENTIAL 01/04/2007  . Allergic rhinitis 01/04/2007  . HEADACHE, TENSION 11/05/2006  . PROSTATE CANCER, HX OF 11/05/2006    Willow Ora 01/02/2015, 12:42 PM  Willow Ora, PTA, [facility] [address] [place], [place] [address] [phone] 01/02/2015, 12:42 PM   Name: [name] MRN: [id number] Date of Birth: [birth year]

## 2015-01-08 ENCOUNTER — Ambulatory Visit: Payer: Commercial Managed Care - HMO | Admitting: Rehabilitative and Restorative Service Providers"

## 2015-01-08 VITALS — BP 130/55 | HR 50

## 2015-01-08 DIAGNOSIS — R531 Weakness: Secondary | ICD-10-CM

## 2015-01-08 DIAGNOSIS — R269 Unspecified abnormalities of gait and mobility: Secondary | ICD-10-CM | POA: Diagnosis not present

## 2015-01-09 DIAGNOSIS — R269 Unspecified abnormalities of gait and mobility: Secondary | ICD-10-CM | POA: Diagnosis not present

## 2015-01-09 DIAGNOSIS — R531 Weakness: Secondary | ICD-10-CM | POA: Diagnosis not present

## 2015-01-09 NOTE — Therapy (Signed)
[facility] [address] [place], [place], [address] Phone: [phone]   Fax:  [phone]  Physical Therapy Treatment  Patient Details  Name: [name] MRN: [id number] Date of Birth: [birth year] Referring Provider: [name], MD  Encounter Date: 01/08/2015      PT End of Session - 01/08/15 1206    Visit Number 10   Number of Visits 12   Date for PT Re-Evaluation 01/12/15  plan of care goes unitl 12/31, date changed   Authorization Type G code every 10th visit $45 copay   PT Start Time 1150   PT Stop Time 1230   PT Time Calculation (min) 40 min   Equipment Utilized During Treatment Gait belt   Activity Tolerance Patient tolerated treatment well   Behavior During Therapy [facility] for tasks assessed/performed      Past Medical History  Diagnosis Date  . HTN (hypertension)     Negative renal duplex 07-22-11  . 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 Esophageal stricture     Distal, benign  . Diverticulosis   . GERD (gastroesophageal reflux disease)   . Hemorrhoids   . Allergic rhinitis   . Prostate cancer ([place]) 2000    Seed XRT  . Dyslipidemia   . Chronic pain syndrome     Left shoulder, back  . Carotid stenosis     a. s/p Left CEA 2002;  b. Carotid US 3/16:  patent R CEA, L < 40%  . Asthma   . Anemia   . Hx of echocardiogram     a. Echo 10/11: mod LVH, EF 55-60%  . Pneumonia 07-2013    Past Surgical History  Procedure Laterality Date  . Cervical disc surgery  8/07  . Lumbar disc surgery    . Appendectomy    . Cataract extraction Bilateral   . Insertion prostate radiation seed  2000  . Nasal sinus surgery      x 3  . Carotid endarterectomy Right 01/2000    Filed Vitals:   01/08/15 1152  BP: 130/55  Pulse: 50    Visit Diagnosis:  Generalized weakness  Abnormality of gait      Subjective Assessment - 01/08/15 1152    Subjective The patient reports he walked on  the treadmill at [employer] end of last week.  He reported he had increased back discomfort on the left side that got worse the next day.  He continues pain at this time in the left lower back since that time.   Patient Stated Goals Patient hopes that walking and leg strength improve.   Currently in Pain? Yes   Pain Score 10-Worst pain ever  can get very severe   Pain Location Back   Pain Orientation Left   Pain Descriptors / Indicators Aching;Sharp   Pain Type Chronic pain   Pain Onset More than a month ago   Pain Frequency Constant   Aggravating Factors  worse lifting left leg during walking   Pain Relieving Factors this has happened in the past, improves with rest.             [facility] PT Assessment - 01/08/15 1209    Standardized Balance Assessment   Standardized Balance Assessment Berg Balance Test   Berg Balance Test   Sit to Stand Able to stand  independently using hands   Standing Unsupported Able to stand safely 2 minutes   Sitting with Back Unsupported  but Feet Supported on Floor or Stool Able to sit safely and securely 2 minutes   Stand to Sit Sits safely with minimal use of hands   Transfers Able to transfer safely, definite need of hands   Standing Unsupported with Eyes Closed Able to stand 10 seconds with supervision   Standing Ubsupported with Feet Together Able to place feet together independently and stand 1 minute safely   From Standing, Reach Forward with Outstretched Arm Can reach forward >12 cm safely (5")   From Standing Position, Pick up Object from Floor Able to pick up shoe safely and easily   From Standing Position, Turn to Look Behind Over each Shoulder Turn sideways only but maintains balance   Turn 360 Degrees Able to turn 360 degrees safely in 4 seconds or less   Standing Unsupported, Alternately Place Feet on Step/Stool Able to complete 4 steps without aid or supervision   Standing Unsupported, One Foot in Front Able to plae foot ahead of the other  independently and hold 30 seconds   Standing on One Leg Able to lift leg independently and hold equal to or more than 3 seconds   Total Score 45   Berg comment: 45/56   Timed Up and Go Test   TUG --  12.7 seconds      NEUROMUSCULAR RE-EDUCATION: Berg=45/56.   Gait: Ambulation outdoor surfaces independently without a device x 500 feet.  TUG=12.7 seconds without a device independently  SELF CARE/HOME MANAGEMENT: The patient and PT discussed modifying community exercise in ways that will reduce pain and make long term success possible including: Start slow and build  Divide out cardio tasks between 3 machines (instead of 30 minutes on treadmill, which increased back pain, begin 10 min on stepper, 10 min on treadmil, 10 min on arm bike). Discussed home program and patient wants to f/u one more session to ensure community + HEP going well.       PT Short Term Goals - 12/12/14 1017    PT SHORT TERM GOAL #1   Title The patient will be indep with HEP for LE strengthening, flexibility, and standing balance activities.   Baseline 12/12/2014: Pt. supervision with HEP requiring further instruction.     Time 4   Period Weeks   Status Not Met   PT SHORT TERM GOAL #2   Title The patient will improve Berg balance score from 37/56 to > or equal to 43/56 to demo decreasing risk for falls.   Baseline 12/12/2014: Pt. improved berg balance score from 37/56 to 50/56.     Time 4   Period Weeks   Status Achieved   PT SHORT TERM GOAL #3   Title The patient will increase gait speed from 2.07 ft/sec to > or equal to 2.62 ft/sec to demo improving functional community gait.   Baseline 12/12/2014: Pt. gait speed 3.26 ft / sec.     Time 4   Period Weeks   Status Achieved   PT SHORT TERM GOAL #4   Title The patient will demonstrate improved gait mechanics without toe scuffing (foot drag) x 230 feet nonstop for safer household mobility.   Baseline *patient reports this has been gait pattern since stroke  in 2003.  d/c goal.   Time 4   Period Weeks   Status Deferred           PT Long Term Goals - 01/08/15 1207    PT LONG TERM GOAL #1   Title The patient will  improve neuro Quality of life by > or equal to 12% to demonstrate improved subjective perception of function.   Baseline Target date 12/28/2014   Time 6   Period Weeks   PT LONG TERM GOAL #2   Title The patient will improve Berg from 37/56 to > or equal to 45/56 to demo decreased risk for falls.   Baseline Met scoring 45/56 on 01/08/2015   Time 6   Period Weeks   Status Achieved   PT LONG TERM GOAL #3   Title The patinet will decrease TUG from 16.92 sec to < or equal to 14 seconds to demo decreased risk for falls.   Baseline Met scoring 12.7 seconds on 01/08/2016.   Time 6   Period Weeks   Status Achieved   PT LONG TERM GOAL #4   Title The patient will negotiate community surfaces without a device independently (including grass, curbs, inclines) x 500 feet.   Baseline Target date 12/28/2014   Time 6   Period Weeks   Status Achieved   PT LONG TERM GOAL #5   Title The patient will verbalize understanding of return to community exercise program for post d/c activities.   Baseline Met on 01/08/2015   Time 6   Period Weeks   Status Achieved               Plan - 01-25-15 9735    Clinical Impression Statement The patient has met 4/5 LTGs.  He wants to continue home exercises and follow up for one last visit to ensure he is progressing well.  PT and patient have progressed activities in the community and PT recomended the patient enter into activities at a slower pace (he had walked x 30 minutes on treadmill) to avoid aggravating back pain.   Pt will benefit from skilled therapeutic intervention in order to improve on the following deficits Abnormal gait;Decreased balance;Decreased mobility;Decreased strength;Postural dysfunction;Difficulty walking;Decreased range of motion;Impaired flexibility;Decreased activity  tolerance   PT Frequency 1x / week   PT Duration 3 weeks   PT Treatment/Interventions ADLs/Self Care Home Management;Balance training;Neuromuscular re-education;Gait training;Stair training;Functional mobility training;Therapeutic activities;Therapeutic exercise;Manual techniques;Patient/family education   PT Next Visit Plan discharge next visit- f/u to check HEP and community re-entry.   Consulted and Agree with Plan of Care Patient          G-Codes - 01/25/15 1345    Functional Assessment Tool Used Berg=45/56   Functional Limitation Mobility: Walking and moving around   Mobility: Walking and Moving Around Current Status [phone]) At least 1 percent but less than 20 percent impaired, limited or restricted   Mobility: Walking and Moving Around Goal Status [phone]) At least 1 percent but less than 20 percent impaired, limited or restricted     Physical Therapy Progress Note  Dates of Reporting Period: 11/15/2014 to 01/08/2015  Objective Reports of Subjective Statement: Paient feels like he is moving better and has returned to community exercise (back hurts from overdoing it).  Objective Measurements: Berg=45/56, TUG=12.7 seconds  Goal Update: see above.    Plan: F/u for one more visit to check HEP and progress with community program.  Reason Skilled Services are Required: one further visit to ensure successful transition to community program.    Rudell Cobb, PT 2015-01-25, 1:46 PM  [facility] [address] [place], [place], [address] Phone: [phone]   Fax:  [phone]  Name: ANTRON SETH MRN: [id number] Date of Birth: [birth year]

## 2015-01-15 ENCOUNTER — Ambulatory Visit: Payer: Commercial Managed Care - HMO | Admitting: Rehabilitative and Restorative Service Providers"

## 2015-01-23 ENCOUNTER — Ambulatory Visit: Payer: PPO | Admitting: Rehabilitative and Restorative Service Providers"

## 2015-01-29 ENCOUNTER — Encounter: Payer: Self-pay | Admitting: Rehabilitative and Restorative Service Providers"

## 2015-01-29 ENCOUNTER — Ambulatory Visit: Payer: PPO | Attending: Internal Medicine | Admitting: Rehabilitative and Restorative Service Providers"

## 2015-01-29 DIAGNOSIS — R531 Weakness: Secondary | ICD-10-CM | POA: Insufficient documentation

## 2015-01-29 DIAGNOSIS — R269 Unspecified abnormalities of gait and mobility: Secondary | ICD-10-CM | POA: Diagnosis not present

## 2015-01-29 NOTE — Therapy (Signed)
[facility] [address] [place], [place], [address] Phone: [phone]   Fax:  [phone]  Physical Therapy Treatment  Patient Details  Name: [name] MRN: [id number] Date of Birth: 1929-04-23 Referring Provider: Dietrich Pates, MD  Encounter Date: 01/29/2015      PT End of Session - 01/29/15 0946    Visit Number 11   Number of Visits 12   Date for PT Re-Evaluation 01/12/15  plan of care goes unitl 12/31, date changed   Authorization Type G code every 10th visit $45 copay   PT Start Time 0932   PT Stop Time 1000   PT Time Calculation (min) 28 min   Activity Tolerance Patient tolerated treatment well   Behavior During Therapy [facility] for tasks assessed/performed      Past Medical History  Diagnosis Date  . HTN (hypertension)     Negative renal duplex 07-22-11  . 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 Esophageal stricture     Distal, benign  . Diverticulosis   . GERD (gastroesophageal reflux disease)   . Hemorrhoids   . Allergic rhinitis   . Prostate cancer (HCC) 2000    Seed XRT  . Dyslipidemia   . Chronic pain syndrome     Left shoulder, back  . Carotid stenosis     a. s/p Left CEA 2002;  b. Carotid US 3/16:  patent R CEA, L < 40%  . Asthma   . Anemia   . Hx of echocardiogram     a. Echo 10/11: mod LVH, EF 55-60%  . Pneumonia 07-2013    Past Surgical History  Procedure Laterality Date  . Cervical disc surgery  8/07  . Lumbar disc surgery    . Appendectomy    . Cataract extraction Bilateral   . Insertion prostate radiation seed  2000  . Nasal sinus surgery      x 3  . Carotid endarterectomy Right 01/2000    There were no vitals filed for this visit.  Visit Diagnosis:  Abnormality of gait  Generalized weakness      Subjective Assessment - 01/29/15 0934    Subjective The patient reports he is doing exercise each night and morning.  He feels he continues to show  improvement in mobility.     Patient Stated Goals Patient hopes that walking and leg strength improve.   Currently in Pain? Yes   Pain Score --  chronic in nature, comes and goes   Pain Location Back   Pain Orientation Left   Multiple Pain Sites No      NEUROMUSCULAR RE-EDUCATION: Berg=50/56       [facility] PT Assessment - 01/29/15 0936    Standardized Balance Assessment   Standardized Balance Assessment Berg Balance Test   Berg Balance Test   Sit to Stand Able to stand without using hands and stabilize independently   Standing Unsupported Able to stand safely 2 minutes   Sitting with Back Unsupported but Feet Supported on Floor or Stool Able to sit safely and securely 2 minutes   Stand to Sit Sits safely with minimal use of hands   Transfers Able to transfer safely, minor use of hands   Standing Unsupported with Eyes Closed Able to stand 10 seconds safely   Standing Ubsupported with Feet Together Able to place feet together independently and stand 1 minute safely   From Standing, Reach Forward with Outstretched Arm Can reach forward >  12 cm safely (5")   From Standing Position, Pick up Object from Floor Able to pick up shoe safely and easily   From Standing Position, Turn to Look Behind Over each Shoulder Turn sideways only but maintains balance   Turn 360 Degrees Able to turn 360 degrees safely in 4 seconds or less   Standing Unsupported, Alternately Place Feet on Step/Stool Able to stand independently and safely and complete 8 steps in 20 seconds   Standing Unsupported, One Foot in Front Able to plae foot ahead of the other independently and hold 30 seconds   Standing on One Leg Able to lift leg independently and hold equal to or more than 3 seconds   Total Score 50   Berg comment: 50/56      THERAPEUTIC EXERCISE: Chin tucks x 10 reps seated Neck ROM within tolerable ROM instructed for HEP Shoulder circles Provided for HEP  SELF CARE/HOME MANAGEMENT: Discussed community  exercise and continued progression of HEP after discharge.       PT Education - 01/29/15 0946    Education provided Yes   Education Details HEP: added chin tucks and neck tension   Person(s) Educated Patient   Methods Explanation;Demonstration;Handout   Comprehension Verbalized understanding;Returned demonstration          PT Short Term Goals - 12/12/14 1017    PT SHORT TERM GOAL #1   Title The patient will be indep with HEP for LE strengthening, flexibility, and standing balance activities.   Baseline 12/12/2014: Pt. supervision with HEP requiring further instruction.     Time 4   Period Weeks   Status Not Met   PT SHORT TERM GOAL #2   Title The patient will improve Berg balance score from 37/56 to > or equal to 43/56 to demo decreasing risk for falls.   Baseline 12/12/2014: Pt. improved berg balance score from 37/56 to 50/56.     Time 4   Period Weeks   Status Achieved   PT SHORT TERM GOAL #3   Title The patient will increase gait speed from 2.07 ft/sec to > or equal to 2.62 ft/sec to demo improving functional community gait.   Baseline 12/12/2014: Pt. gait speed 3.26 ft / sec.     Time 4   Period Weeks   Status Achieved   PT SHORT TERM GOAL #4   Title The patient will demonstrate improved gait mechanics without toe scuffing (foot drag) x 230 feet nonstop for safer household mobility.   Baseline *patient reports this has been gait pattern since stroke in 2003.  d/c goal.   Time 4   Period Weeks   Status Deferred           PT Long Term Goals - 01/29/15 1004    PT LONG TERM GOAL #1   Title The patient will improve neuro Quality of life by > or equal to 12% to demonstrate improved subjective perception of function.   Baseline Patient's QOL score remains near initial eval score.   Time 6   Period Weeks   Status Not Met   PT LONG TERM GOAL #2   Title The patient will improve Berg from 37/56 to > or equal to 45/56 to demo decreased risk for falls.   Baseline Met  scoring 45/56 on 01/08/2015/ improved further with HEP alone scoring 50/56 on 01/29/2015   Time 6   Period Weeks   Status Achieved   PT LONG TERM GOAL #3   Title The patinet will  decrease TUG from 16.92 sec to < or equal to 14 seconds to demo decreased risk for falls.   Baseline Met scoring 12.7 seconds on 01/08/2016.   Time 6   Period Weeks   Status Achieved   PT LONG TERM GOAL #4   Title The patient will negotiate community surfaces without a device independently (including grass, curbs, inclines) x 500 feet.   Baseline Target date 12/28/2014   Time 6   Period Weeks   Status Achieved   PT LONG TERM GOAL #5   Title The patient will verbalize understanding of return to community exercise program for post d/c activities.   Baseline Met on 01/08/2015   Time 6   Period Weeks   Status Achieved               Plan - February 24, 2015 1035    Clinical Impression Statement The patient has met 4/5 LTGs.  He has a HEP for post d/c activities and plans to return to [employer] program.   Pt will benefit from skilled therapeutic intervention in order to improve on the following deficits Abnormal gait;Decreased balance;Decreased mobility;Decreased strength;Postural dysfunction;Difficulty walking;Decreased range of motion;Impaired flexibility;Decreased activity tolerance   PT Frequency 1x / week   PT Duration 3 weeks   PT Treatment/Interventions ADLs/Self Care Home Management;Balance training;Neuromuscular re-education;Gait training;Stair training;Functional mobility training;Therapeutic activities;Therapeutic exercise;Manual techniques;Patient/family education   PT Next Visit Plan discharge   Consulted and Agree with Plan of Care Patient          G-Codes - 02-24-15 0942    Functional Assessment Tool Used Berg=50/56   Functional Limitation Mobility: Walking and moving around   Mobility: Walking and Moving Around Goal Status [phone]) At least 1 percent but less than 20 percent  impaired, limited or restricted   Mobility: Walking and Moving Around Discharge Status [phone]) At least 1 percent but less than 20 percent impaired, limited or restricted      Problem List Patient Active Problem List   Diagnosis Date Noted  . Umbilical hernia 11/06/2011  . Asthma 10/08/2011  . Carotid stenosis 03/31/2011  . Chronic pain   . Vitamin D deficiency 12/11/2008  . HLD (hyperlipidemia) 12/11/2008  . ANEMIA 12/11/2008  . Cerebral artery occlusion with cerebral infarction (HCC) 12/11/2008  . URINARY INCONTINENCE, MALE 12/11/2008  . BURSITIS, LEFT SHOULDER 12/29/2007  . ANXIETY, MILD 08/09/2007  . HYPERTENSION, BENIGN ESSENTIAL 01/04/2007  . Allergic rhinitis 01/04/2007  . HEADACHE, TENSION 11/05/2006  . PROSTATE CANCER, HX OF 11/05/2006    Senia Even, PT 24-Feb-2015, 10:46 AM  [facility] [address] [place], [place], [address] Phone: [phone]   Fax:  [phone]  Name: [name] MRN: [id number] Date of Birth: 09-01-1929

## 2015-01-29 NOTE — Therapy (Signed)
[facility] [address] [place], [place], [address] Phone: [phone]   Fax:  [phone]  Patient Details  Name: [name] MRN: [id number] Date of Birth: 10-05-1929 Referring Provider:  No ref. provider found  Encounter Date: 01/29/2015  PHYSICAL THERAPY DISCHARGE SUMMARY  Visits from Start of Care: 11  Current functional level related to goals / functional outcomes:     PT Short Term Goals - 12/12/14 1017    PT SHORT TERM GOAL #1   Title The patient will be indep with HEP for LE strengthening, flexibility, and standing balance activities.   Baseline 12/12/2014: Pt. supervision with HEP requiring further instruction.     Time 4   Period Weeks   Status Not Met   PT SHORT TERM GOAL #2   Title The patient will improve Berg balance score from 37/56 to > or equal to 43/56 to demo decreasing risk for falls.   Baseline 12/12/2014: Pt. improved berg balance score from 37/56 to 50/56.     Time 4   Period Weeks   Status Achieved   PT SHORT TERM GOAL #3   Title The patient will increase gait speed from 2.07 ft/sec to > or equal to 2.62 ft/sec to demo improving functional community gait.   Baseline 12/12/2014: Pt. gait speed 3.26 ft / sec.     Time 4   Period Weeks   Status Achieved   PT SHORT TERM GOAL #4   Title The patient will demonstrate improved gait mechanics without toe scuffing (foot drag) x 230 feet nonstop for safer household mobility.   Baseline *patient reports this has been gait pattern since stroke in 2003.  d/c goal.   Time 4   Period Weeks   Status Deferred         PT Long Term Goals - 01/29/15 1004    PT LONG TERM GOAL #1   Title The patient will improve neuro Quality of life by > or equal to 12% to demonstrate improved subjective perception of function.   Baseline Patient's QOL score remains near initial eval score.   Time 6   Period Weeks   Status Not Met   PT LONG TERM GOAL #2   Title  The patient will improve Berg from 37/56 to > or equal to 45/56 to demo decreased risk for falls.   Baseline Met scoring 45/56 on 01/08/2015/ improved further with HEP alone scoring 50/56 on 01/29/2015   Time 6   Period Weeks   Status Achieved   PT LONG TERM GOAL #3   Title The patinet will decrease TUG from 16.92 sec to < or equal to 14 seconds to demo decreased risk for falls.   Baseline Met scoring 12.7 seconds on 01/08/2016.   Time 6   Period Weeks   Status Achieved   PT LONG TERM GOAL #4   Title The patient will negotiate community surfaces without a device independently (including grass, curbs, inclines) x 500 feet.   Baseline Target date 12/28/2014   Time 6   Period Weeks   Status Achieved   PT LONG TERM GOAL #5   Title The patient will verbalize understanding of return to community exercise program for post d/c activities.   Baseline Met on 01/08/2015   Time 6   Period Weeks   Status Achieved        Remaining deficits: Chronic deficits s/p CVA in medical history with decreased high level balance and L foot clearance  during gait.   Education / Equipment: HEP, home safety, progression to community exercise  Plan: Patient agrees to discharge.  Patient goals were partially met. Patient is being discharged due to meeting the stated rehab goals.  ?????       Thank you for the referral of this patient. Rudell Cobb, MPT   [place] 01/29/2015, 10:58 AM  [facility] [address] [place], [place], [address] Phone: [phone]   Fax:  [phone]

## 2015-01-29 NOTE — Patient Instructions (Signed)
Stretch Break - Shoulder Roll    Roll shoulders forward, up, back, and down to complete a circle __10__ times. Reverse direction and repeat. Repeat _10___ times every _4-6___ hours.  Copyright  VHI. All rights reserved.   Axial Extension (Chin Tuck)    Pull chin in and lengthen back of neck. Hold _3-5___ seconds while counting out loud. Repeat __10__ times. Do __2__ sessions per day.  http://gt2.exer.us/449   Copyright  VHI. All rights reserved.

## 2015-02-09 ENCOUNTER — Emergency Department (INDEPENDENT_AMBULATORY_CARE_PROVIDER_SITE_OTHER)
Admission: EM | Admit: 2015-02-09 | Discharge: 2015-02-09 | Disposition: A | Discharge status: Home / Self Care | Payer: PPO | Source: Home / Self Care | Attending: Family Medicine | Admitting: Family Medicine

## 2015-02-09 ENCOUNTER — Encounter (HOSPITAL_COMMUNITY): Payer: Self-pay | Admitting: Emergency Medicine

## 2015-02-09 DIAGNOSIS — N390 Urinary tract infection, site not specified: Secondary | ICD-10-CM

## 2015-02-09 DIAGNOSIS — R269 Unspecified abnormalities of gait and mobility: Secondary | ICD-10-CM | POA: Diagnosis not present

## 2015-02-09 LAB — POCT URINALYSIS DIP (DEVICE)
Glucose, UA: 100 mg/dL — AB
Ketones, ur: 15 mg/dL — AB
NITRITE: POSITIVE — AB
PH: 5 (ref 5.0–8.0)
Protein, ur: 100 mg/dL — AB
Specific Gravity, Urine: 1.02 (ref 1.005–1.030)
Urobilinogen, UA: 2 mg/dL — ABNORMAL HIGH (ref 0.0–1.0)

## 2015-02-09 MED ORDER — PHENAZOPYRIDINE HCL 100 MG PO TABS: 100.0000 mg | ORAL_TABLET | Freq: Three times a day (TID) | ORAL | Status: DC | PRN

## 2015-02-09 MED ORDER — CEPHALEXIN 500 MG PO CAPS: 500.0000 mg | ORAL_CAPSULE | Freq: Four times a day (QID) | ORAL | Status: DC

## 2015-02-09 NOTE — ED Notes (Signed)
Pt here with possible UTI that started yesterday Wife gave old script Pyridium with some relief  Denies pain or reports of confusion

## 2015-02-09 NOTE — ED Provider Notes (Signed)
CSN: [id number]     Arrival date & time 02/09/15  1849 History   First MD Initiated Contact with Patient 02/09/15 2013     Chief Complaint  Patient presents with  . Urinary Tract Infection   (Consider location/radiation/quality/duration/timing/severity/associated sxs/prior Treatment) Patient is a 82 y.o. male presenting with urinary tract infection. The history is provided by the patient and the spouse.  Urinary Tract Infection This is a recurrent problem. The current episode started yesterday. The problem has been gradually worsening.    Past Medical History  Diagnosis Date  . HTN (hypertension)     Negative renal duplex 07-22-11  . 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 Esophageal stricture     Distal, benign  . Diverticulosis   . GERD (gastroesophageal reflux disease)   . Hemorrhoids   . Allergic rhinitis   . Prostate cancer ([place]) 2000    Seed XRT  . Dyslipidemia   . Chronic pain syndrome     Left shoulder, back  . Carotid stenosis     a. s/p Left CEA 2002;  b. Carotid US 3/16:  patent R CEA, L < 40%  . Asthma   . Anemia   . Hx of echocardiogram     a. Echo 10/11: mod LVH, EF 55-60%  . Pneumonia 07-2013   Past Surgical History  Procedure Laterality Date  . Cervical disc surgery  8/07  . Lumbar disc surgery    . Appendectomy    . Cataract extraction Bilateral   . Insertion prostate radiation seed  2000  . Nasal sinus surgery      x 3  . Carotid endarterectomy Right 01/2000   Family History  Problem Relation Age of Onset  . Lung cancer Brother   . Stroke Sister    Social History  Substance Use Topics  . Smoking status: Former Smoker -- 0.25 packs/day for 35 years    Types: Cigarettes    Quit date: 01/12/1978  . Smokeless tobacco: Current User    Types: Chew  . Alcohol Use: No     Comment: Prior alcoholic, sober since 0865    Review of Systems  Constitutional: Negative for fever and chills.  Gastrointestinal: Negative.    Genitourinary: Positive for dysuria, urgency, frequency and hematuria. Negative for flank pain and discharge.    Allergies  Oxycodone-acetaminophen; Codeine; Diovan; Hctz; Oxycodone-acetaminophen; and Tramadol  Home Medications   Prior to Admission medications   Medication Sig Start Date End Date Taking? Authorizing Provider  albuterol (PROVENTIL HFA;VENTOLIN HFA) 108 (90 BASE) MCG/ACT inhaler Inhale 1-2 puffs into the lungs every 6 (six) hours as needed for wheezing or shortness of breath. 03/15/14   Fay Records, MD  amLODipine (NORVASC) 5 MG tablet Take 1 tablet (5 mg total) by mouth 2 (two) times daily. 12/10/14   Rowe Clack, MD  aspirin 325 MG tablet Take 325 mg by mouth daily.    Historical Provider, MD  atorvastatin (LIPITOR) 40 MG tablet Take 1 tablet (40 mg total) by mouth daily. 09/25/14   Fay Records, MD  budesonide-formoterol [facility]) 160-4.5 MCG/ACT inhaler Inhale 2 puffs into the lungs 2 (two) times daily. 11/06/13   Rigoberto Noel, MD  cephALEXin (KEFLEX) 500 MG capsule Take 1 capsule (500 mg total) by mouth 4 (four) times daily. Take all of medicine and drink lots of fluids 02/09/15   Billy Fischer, MD  cetirizine (ZYRTEC) 10 MG tablet Take 10 mg by  mouth daily.    Historical Provider, MD  cholecalciferol (VITAMIN D) 1000 UNITS tablet Take 1,000 Units by mouth daily.    Historical Provider, MD  cloNIDine (CATAPRES) 0.1 MG tablet Take 1 tablet (0.1 mg total) by mouth daily. 12/04/14   Rowe Clack, MD  clopidogrel (PLAVIX) 75 MG tablet Take 1 tablet (75 mg total) by mouth daily. 07/30/14   Rowe Clack, MD  docusate sodium (COLACE) 100 MG capsule Take 100 mg by mouth 2 (two) times daily.    Historical Provider, MD  fluticasone (FLONASE) 50 MCG/ACT nasal spray Place 2 sprays into both nostrils daily. 04/27/14   Hoyt Koch, MD  gabapentin (NEURONTIN) 300 MG capsule Take 1 capsule (300 mg total) by mouth 3 (three) times daily. 09/19/14   Rowe Clack, MD  methyldopa (ALDOMET) 500 MG tablet Take 2 tablets by mouth in the morning, 1 tablet by mouth mid day and 2 tablets by mouth in the evening 10/18/14   Fay Records, MD  montelukast (SINGULAIR) 10 MG tablet TAKE 1 TABLET EVERY DAY AT BEDTIME 12/19/14   Rigoberto Noel, MD  omeprazole (PRILOSEC) 40 MG capsule Take 1 capsule (40 mg total) by mouth daily. 12/19/14   Rowe Clack, MD  phenazopyridine (PYRIDIUM) 100 MG tablet Take 1 tablet (100 mg total) by mouth 3 (three) times daily as needed for pain. 02/09/15   Billy Fischer, MD  polyethylene glycol (MIRALAX / Floria Raveling) packet Take 17 g by mouth as needed for mild constipation.     Historical Provider, MD  senna-docusate (SENOKOT-S) 8.6-50 MG per tablet Take 1 tablet by mouth daily as needed for mild constipation.     Historical Provider, MD  traMADol (ULTRAM) 50 MG tablet Take 50 mg by mouth every 8 (eight) hours. pain 10/08/14   Historical Provider, MD  triamcinolone cream (KENALOG) 0.5 % Apply 1 application topically as needed (for skin irritation).  01/22/12   Janith Lima, MD  Wheat Dextrin (BENEFIBER PO) Take 1 scoop by mouth 2 (two) times daily. Powder- Mix with liquid    Historical Provider, MD   Meds Ordered and Administered this Visit  Medications - No data to display  BP 149/69 mmHg  Pulse 64  Temp(Src) 97.7 F (36.5 C) (Oral)  Resp 18  SpO2 95% No data found.   Physical Exam  Constitutional: He is oriented to person, place, and time. He appears well-developed and well-nourished. No distress.  Abdominal: Soft. Bowel sounds are normal. He exhibits no mass. There is no tenderness. There is no rebound and no guarding.  Neurological: He is alert and oriented to person, place, and time.  Skin: Skin is warm and dry.  Nursing note and vitals reviewed.   ED Course  Procedures (including critical care time)  Labs Review Labs Reviewed  POCT URINALYSIS DIP (DEVICE) - Abnormal; Notable for the following:    Glucose, UA  100 (*)    Bilirubin Urine SMALL (*)    Ketones, ur 15 (*)    Hgb urine dipstick TRACE (*)    Protein, ur 100 (*)    Urobilinogen, UA 2.0 (*)    Nitrite POSITIVE (*)    Leukocytes, UA LARGE (*)    All other components within normal limits  URINE CULTURE    Imaging Review No results found.   Visual Acuity Review  Right Eye Distance:   Left Eye Distance:   Bilateral Distance:    Right Eye Near:   Left  Eye Near:    Bilateral Near:         MDM   1. UTI (lower urinary tract infection)    Meds ordered this encounter  Medications  . cephALEXin (KEFLEX) 500 MG capsule    Sig: Take 1 capsule (500 mg total) by mouth 4 (four) times daily. Take all of medicine and drink lots of fluids    Dispense:  28 capsule    Refill:  0  . phenazopyridine (PYRIDIUM) 100 MG tablet    Sig: Take 1 tablet (100 mg total) by mouth 3 (three) times daily as needed for pain.    Dispense:  20 tablet    Refill:  0       Billy Fischer, MD 02/09/15 [phone]

## 2015-02-09 NOTE — Discharge Instructions (Signed)
Take all of medicine as directed, drink lots of fluids, see your doctor if further problems. °

## 2015-02-11 DIAGNOSIS — M961 Postlaminectomy syndrome, not elsewhere classified: Secondary | ICD-10-CM | POA: Diagnosis not present

## 2015-02-11 DIAGNOSIS — G8929 Other chronic pain: Secondary | ICD-10-CM | POA: Diagnosis not present

## 2015-02-11 DIAGNOSIS — M47816 Spondylosis without myelopathy or radiculopathy, lumbar region: Secondary | ICD-10-CM | POA: Diagnosis not present

## 2015-02-11 DIAGNOSIS — M545 Low back pain: Secondary | ICD-10-CM | POA: Diagnosis not present

## 2015-02-11 LAB — URINE CULTURE
Culture: >=100000
Special Requests: NORMAL

## 2015-02-11 MED FILL — traMADol HCL 50 MG TABS: 50 | 30 days supply | Qty: 90 | Fill #0

## 2015-02-12 ENCOUNTER — Telehealth: Payer: Self-pay | Admitting: Internal Medicine

## 2015-02-12 DIAGNOSIS — R5383 Other fatigue: Secondary | ICD-10-CM

## 2015-02-12 DIAGNOSIS — R531 Weakness: Secondary | ICD-10-CM

## 2015-02-12 DIAGNOSIS — I10 Essential (primary) hypertension: Secondary | ICD-10-CM

## 2015-02-12 MED ORDER — METHYLDOPA 500 MG PO TABS: ORAL_TABLET | ORAL | Status: DC

## 2015-02-12 NOTE — Telephone Encounter (Signed)
Pt's Rx was sent to pt's pharmacy as requested. Confirmation received. [facility].

## 2015-02-12 NOTE — Telephone Encounter (Signed)
Called pt and left a message for pt to call back clarifying the right pharmacy the he would like for his Rx to be sent too.

## 2015-02-12 NOTE — Telephone Encounter (Signed)
*  STAT* If patient is at the pharmacy, call can be transferred to refill team.   1. Which medications need to be refilled? (please list name of each medication and dose if known) methyldopa- 500 mg  2. Which pharmacy/location (including street and city if local pharmacy) is medication to be sent to? [facility]   3. Do they need a 30 day or 90 day supply? [place]

## 2015-02-12 NOTE — Telephone Encounter (Signed)
F/u    Pt want meds [facility] [phone].

## 2015-02-15 ENCOUNTER — Telehealth: Payer: Self-pay | Admitting: Internal Medicine

## 2015-02-15 DIAGNOSIS — R531 Weakness: Secondary | ICD-10-CM

## 2015-02-15 DIAGNOSIS — R5383 Other fatigue: Secondary | ICD-10-CM

## 2015-02-15 DIAGNOSIS — I10 Essential (primary) hypertension: Secondary | ICD-10-CM

## 2015-02-15 MED ORDER — METHYLDOPA 500 MG PO TABS: ORAL_TABLET | ORAL | Status: DC

## 2015-02-15 MED FILL — METHYLDOPA 500 MG TABLET: 500 | 30 days supply | Qty: 150 | Fill #0

## 2015-02-15 NOTE — Telephone Encounter (Signed)
rx refill Patient said that his insurance company needs an exception letter to approve a 90 day fill of his aldomet for his mail pharmacy Routing to [place] to help get this approved

## 2015-02-18 ENCOUNTER — Other Ambulatory Visit: Payer: Self-pay

## 2015-02-19 NOTE — Telephone Encounter (Signed)
Spoke with patient about this problem. He states he got his Methyldopa filled at [facility] for $44.00. I tried calling [facility] for an explanation of why a letter is needed, but never could get through.

## 2015-02-21 ENCOUNTER — Telehealth: Payer: Self-pay | Admitting: Internal Medicine

## 2015-02-21 DIAGNOSIS — R531 Weakness: Secondary | ICD-10-CM

## 2015-02-21 DIAGNOSIS — I10 Essential (primary) hypertension: Secondary | ICD-10-CM

## 2015-02-21 DIAGNOSIS — R5383 Other fatigue: Secondary | ICD-10-CM

## 2015-02-21 MED ORDER — METHYLDOPA 500 MG PO TABS: ORAL_TABLET | ORAL | Status: DC

## 2015-02-21 MED ORDER — CLONIDINE HCL 0.1 MG PO TABS: 0.1000 mg | ORAL_TABLET | Freq: Every day | ORAL | Status: DC

## 2015-02-21 NOTE — Telephone Encounter (Signed)
Patient requested med refills  per his request I sent 30 day supply of clonidine to [place] out pt pharmacy and 90 day supply to his mail order pharmacy. Also per his request I send a 90 day supply of methyldopa to his mail order pharmacy.    Pt is appreciative for the assistance.

## 2015-02-21 NOTE — Telephone Encounter (Signed)
New Message  Pt requested to speak w/ RN concerning her medications. Please call back and discuss.

## 2015-03-01 ENCOUNTER — Telehealth: Payer: Self-pay | Admitting: *Deleted

## 2015-03-01 MED ORDER — CLONIDINE HCL 0.1 MG PO TABS: 0.1000 mg | ORAL_TABLET | Freq: Two times a day (BID) | ORAL | Status: DC

## 2015-03-01 MED ORDER — CLONIDINE HCL 0.1 MG PO TABS
0.1000 mg | ORAL_TABLET | Freq: Two times a day (BID) | ORAL | Status: DC
Start: 2015-03-01 — End: 2015-04-01

## 2015-03-01 MED FILL — cloNIDine HCL 0.1 MG TABS: 0.1 | 30 days supply | Qty: 60 | Fill #0

## 2015-03-01 NOTE — Telephone Encounter (Signed)
Patient called and was very upset that the clonidine was only sent in for once daily and he has been taking it twice a day for years. He would like a call back from the nurse at [phone].

## 2015-03-01 NOTE — Telephone Encounter (Signed)
Spoke with patient reviewed medications. He has been taking clonidine 0.1 mg twice daily ever since his dose was decreased by Dr. Harrington Challenger last year.  I spoke with [place] and sent for 30 more tables (15 days) to hold him until his mail order medicine comes in. I also called and spoke with [facility] pharmacist.  She changed his clonidine to 0.1 mg BID, 90 day supply, 3 refills.  Pt is appreciative for this assistance.

## 2015-03-02 ENCOUNTER — Emergency Department (INDEPENDENT_AMBULATORY_CARE_PROVIDER_SITE_OTHER): Payer: PPO

## 2015-03-02 ENCOUNTER — Encounter (HOSPITAL_COMMUNITY): Payer: Self-pay | Admitting: Emergency Medicine

## 2015-03-02 ENCOUNTER — Emergency Department (INDEPENDENT_AMBULATORY_CARE_PROVIDER_SITE_OTHER)
Admission: EM | Admit: 2015-03-02 | Discharge: 2015-03-02 | Disposition: A | Discharge status: Home / Self Care | Payer: PPO | Source: Home / Self Care | Attending: Emergency Medicine | Admitting: Emergency Medicine

## 2015-03-02 DIAGNOSIS — J40 Bronchitis, not specified as acute or chronic: Secondary | ICD-10-CM

## 2015-03-02 DIAGNOSIS — R0602 Shortness of breath: Secondary | ICD-10-CM | POA: Diagnosis not present

## 2015-03-02 DIAGNOSIS — R05 Cough: Secondary | ICD-10-CM | POA: Diagnosis not present

## 2015-03-02 MED ORDER — IPRATROPIUM-ALBUTEROL 0.5-2.5 (3) MG/3ML IN SOLN
RESPIRATORY_TRACT | Status: AC
  Filled 2015-03-02: qty 3

## 2015-03-02 MED ORDER — AZITHROMYCIN 250 MG PO TABS: ORAL_TABLET | ORAL | Status: DC

## 2015-03-02 MED ORDER — ALBUTEROL SULFATE HFA 108 (90 BASE) MCG/ACT IN AERS: 1.0000 | INHALATION_SPRAY | Freq: Four times a day (QID) | RESPIRATORY_TRACT | Status: DC | PRN

## 2015-03-02 MED ORDER — IPRATROPIUM-ALBUTEROL 0.5-2.5 (3) MG/3ML IN SOLN
3.0000 mL | Freq: Once | RESPIRATORY_TRACT | Status: AC
  Administered 2015-03-02: 3 mL via RESPIRATORY_TRACT

## 2015-03-02 MED ORDER — PREDNISONE 50 MG PO TABS: ORAL_TABLET | ORAL | Status: DC

## 2015-03-02 MED ORDER — BENZONATATE 200 MG PO CAPS: 200.0000 mg | ORAL_CAPSULE | Freq: Two times a day (BID) | ORAL | Status: DC | PRN

## 2015-03-02 NOTE — Discharge Instructions (Signed)
You have bronchitis. Take azithromycin and prednisone as prescribed. Use tessalon as needed for cough. Use the albuterol every 4 hours as needed for wheezing or cough. You should see improvement in the next 3-5 days. If you develop fevers, difficulty breathing, or are just not getting better, please come back or go to the emergency room.

## 2015-03-02 NOTE — ED Notes (Signed)
The patient presented to the [facility] with a complaint of a cough and chest congestion x 2 days. 

## 2015-03-02 NOTE — ED Provider Notes (Signed)
CSN: [id number]     Arrival date & time 03/02/15  1545 History   First MD Initiated Contact with Patient 03/02/15 1722     Chief Complaint  Patient presents with  . Cough   (Consider location/radiation/quality/duration/timing/severity/associated sxs/prior Treatment) HPI He is an 82 year old man here for evaluation of cough. He states this started 2 or 3 days ago with cough and chest congestion. He also reports some mild nasal congestion and rhinorrhea. His cough gets worse at night. He does report some shortness of breath, particularly at night. No known fevers or chills. No nausea or vomiting. His appetite is normal. An episode of pneumonia about a year ago.  Past Medical History  Diagnosis Date  . HTN (hypertension)     Negative renal duplex 07-22-11  . 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 Esophageal stricture     Distal, benign  . Diverticulosis   . GERD (gastroesophageal reflux disease)   . Hemorrhoids   . Allergic rhinitis   . Prostate cancer ([place]) 2000    Seed XRT  . Dyslipidemia   . Chronic pain syndrome     Left shoulder, back  . Carotid stenosis     a. s/p Left CEA 2002;  b. Carotid US 3/16:  patent R CEA, L < 40%  . Asthma   . Anemia   . Hx of echocardiogram     a. Echo 10/11: mod LVH, EF 55-60%  . Pneumonia 07-2013   Past Surgical History  Procedure Laterality Date  . Cervical disc surgery  8/07  . Lumbar disc surgery    . Appendectomy    . Cataract extraction Bilateral   . Insertion prostate radiation seed  2000  . Nasal sinus surgery      x 3  . Carotid endarterectomy Right 01/2000   Family History  Problem Relation Age of Onset  . Lung cancer Brother   . Stroke Sister    Social History  Substance Use Topics  . Smoking status: Former Smoker -- 0.25 packs/day for 35 years    Types: Cigarettes    Quit date: 01/12/1978  . Smokeless tobacco: Current User    Types: Chew  . Alcohol Use: No     Comment: Prior alcoholic, sober  since 7564    Review of Systems As in history of present illness Allergies  Oxycodone-acetaminophen; Codeine; Diovan; Hctz; Oxycodone-acetaminophen; and Tramadol  Home Medications   Prior to Admission medications   Medication Sig Start Date End Date Taking? Authorizing Provider  amLODipine (NORVASC) 5 MG tablet Take 1 tablet (5 mg total) by mouth 2 (two) times daily. 12/10/14  Yes Rowe Clack, MD  aspirin 325 MG tablet Take 325 mg by mouth daily.   Yes Historical Provider, MD  atorvastatin (LIPITOR) 40 MG tablet Take 1 tablet (40 mg total) by mouth daily. 09/25/14  Yes Fay Records, MD  budesonide-formoterol [facility]) 160-4.5 MCG/ACT inhaler Inhale 2 puffs into the lungs 2 (two) times daily. 11/06/13  Yes Rigoberto Noel, MD  cetirizine (ZYRTEC) 10 MG tablet Take 10 mg by mouth daily.   Yes Historical Provider, MD  cloNIDine (CATAPRES) 0.1 MG tablet Take 1 tablet (0.1 mg total) by mouth 2 (two) times daily. 03/01/15  Yes Fay Records, MD  cloNIDine (CATAPRES) 0.1 MG tablet Take 1 tablet (0.1 mg total) by mouth 2 (two) times daily. 03/01/15  Yes Fay Records, MD  clopidogrel (PLAVIX) 75 MG tablet Take 1  tablet (75 mg total) by mouth daily. 07/30/14  Yes Rowe Clack, MD  docusate sodium (COLACE) 100 MG capsule Take 100 mg by mouth 2 (two) times daily.   Yes Historical Provider, MD  gabapentin (NEURONTIN) 300 MG capsule Take 1 capsule (300 mg total) by mouth 3 (three) times daily. 09/19/14  Yes Rowe Clack, MD  methyldopa (ALDOMET) 500 MG tablet Take 2 tablets by mouth in the morning, 1 tablet by mouth mid day and 2 tablets by mouth in the evening 02/21/15  Yes Fay Records, MD  montelukast (SINGULAIR) 10 MG tablet TAKE 1 TABLET EVERY DAY AT BEDTIME 12/19/14  Yes Rigoberto Noel, MD  omeprazole (PRILOSEC) 40 MG capsule Take 1 capsule (40 mg total) by mouth daily. 12/19/14  Yes Rowe Clack, MD  traMADol (ULTRAM) 50 MG tablet Take 50 mg by mouth every 8 (eight) hours. pain 10/08/14   Yes Historical Provider, MD  triamcinolone cream (KENALOG) 0.5 % Apply 1 application topically as needed (for skin irritation).  01/22/12  Yes Janith Lima, MD  albuterol (PROVENTIL HFA;VENTOLIN HFA) 108 (90 Base) MCG/ACT inhaler Inhale 1-2 puffs into the lungs every 6 (six) hours as needed for wheezing or shortness of breath. 03/02/15   Melony Overly, MD  azithromycin (ZITHROMAX Z-PAK) 250 MG tablet Take 2 pills today, then 1 pill daily until gone. 03/02/15   Melony Overly, MD  benzonatate (TESSALON) 200 MG capsule Take 1 capsule (200 mg total) by mouth 2 (two) times daily as needed for cough. 03/02/15   Melony Overly, MD  cephALEXin (KEFLEX) 500 MG capsule Take 1 capsule (500 mg total) by mouth 4 (four) times daily. Take all of medicine and drink lots of fluids 02/09/15   Billy Fischer, MD  cholecalciferol (VITAMIN D) 1000 UNITS tablet Take 1,000 Units by mouth daily.    Historical Provider, MD  fluticasone (FLONASE) 50 MCG/ACT nasal spray Place 2 sprays into both nostrils daily. 04/27/14   Hoyt Koch, MD  phenazopyridine (PYRIDIUM) 100 MG tablet Take 1 tablet (100 mg total) by mouth 3 (three) times daily as needed for pain. 02/09/15   Billy Fischer, MD  polyethylene glycol (MIRALAX / Floria Raveling) packet Take 17 g by mouth as needed for mild constipation.     Historical Provider, MD  predniSONE (DELTASONE) 50 MG tablet Take 1 pill daily for 5 days. 03/02/15   Melony Overly, MD  senna-docusate (SENOKOT-S) 8.6-50 MG per tablet Take 1 tablet by mouth daily as needed for mild constipation.     Historical Provider, MD  Wheat Dextrin (BENEFIBER PO) Take 1 scoop by mouth 2 (two) times daily. Powder- Mix with liquid    Historical Provider, MD   Meds Ordered and Administered this Visit   Medications  ipratropium-albuterol (DUONEB) 0.5-2.5 (3) MG/3ML nebulizer solution 3 mL (3 mLs Nebulization Given 03/02/15 1745)    BP 181/56 mmHg  Pulse 58  Temp(Src) 97.7 F (36.5 C) (Oral)  SpO2 96% No data  found.   Physical Exam  Constitutional: He is oriented to person, place, and time. He appears well-developed and well-nourished. No distress.  Neck: Neck supple.  Cardiovascular: Normal rate, regular rhythm and normal heart sounds.   No murmur heard. Pulmonary/Chest: Effort normal. No respiratory distress. He has wheezes (iffuse expiratory wheezes). He has no rales.  Neurological: He is alert and oriented to person, place, and time.    ED Course  Procedures (including critical care time)  Labs Review  Labs Reviewed - No data to display  Imaging Review Dg Chest 2 View  03/02/2015  CLINICAL DATA:  Productive cough and shortness of breath for 2 days. EXAM: CHEST  2 VIEW COMPARISON:  Chest radiographs 08/21/2013 FINDINGS: The cardiomediastinal contours are normal. The lungs are clear. Previous left lung consolidation has resolved. Pulmonary vasculature is normal. No consolidation, pleural effusion, or pneumothorax. No acute osseous abnormalities are seen. Pectus carinatum deformity unchanged. IMPRESSION: No acute pulmonary process. Electronically Signed   By: Jeb Levering M.D.   On: 03/02/2015 18:12      MDM   1. Bronchitis    Lung sounds much improved after DuoNeb. He does still have some scattered rhonchi, but wheezing is resolved.  We'll treat with prednisone and azithromycin. Tessalon as needed for cough. Albuterol as needed for wheezing. Follow-up if not improving in 2 days.    Melony Overly, MD 03/02/15 [phone]

## 2015-03-05 ENCOUNTER — Telehealth: Payer: Self-pay

## 2015-03-05 NOTE — Telephone Encounter (Signed)
Letter of exception to coverage review for Methyldopa faxed to [employer].

## 2015-03-13 ENCOUNTER — Telehealth: Payer: Self-pay | Admitting: *Deleted

## 2015-03-13 MED FILL — traMADol HCL 50 MG TABS: 50 | 30 days supply | Qty: 90 | Fill #0

## 2015-03-13 NOTE — Telephone Encounter (Deleted)
[name] from

## 2015-03-13 NOTE — Telephone Encounter (Signed)
[name] from [facility] and stated that when they tried to fill the methyldopa it came up as a duplication in therapy with the clonidine. They need clarification before they will fill the methyldopa. She can be reached at [phone]. Please advise. Thanks, MI

## 2015-03-13 NOTE — Telephone Encounter (Signed)
Mateo Flow from [employer] is calling to obtain a clarification from Dr Harrington Challenger on the medication Methyldopa.  Per Mateo Flow, the pts insurance is flagging this medication in the system, as a duplicate, because the pt is also taking clonidine 0.1 mg po bid.  Per Mateo Flow at [facility], the medication methyldopa and clonidine consist of the same component properties, which is why the insurance is flagging filling the pts methyldopa at this time.  Mateo Flow needs a clarification order on if the pt should be taking methyldopa.  Per Mateo Flow, the current order received on methyldopa is 500 mg tablet, pt is to take 2 tablets po in the morning, 1 tablet po mid-day, and 2 tablets po in the evening.  Mateo Flow needs to know from Dr Harrington Challenger is this the correct dose she wants the pt on, and should he be taking clonidine 0.1 mg po bid in addition? Per Mateo Flow, once she receives clarification on these meds, the insurance company will allow them to fill the med accordingly. Informed Mateo Flow that both Dr Harrington Challenger and her covering nurse are out of the office today.  Informed Mateo Flow that I will route this message to them for further review, recommendation, and follow-up with her thereafter at [phone]. Mateo Flow verbalized understanding and agrees with this plan.

## 2015-03-13 NOTE — Telephone Encounter (Deleted)
Rodman Key, RN at 03/01/2015 5:45 PM     Status: Signed       Expand All Collapse All   Spoke with patient reviewed medications. He has been taking clonidine 0.1 mg twice daily ever since his dose was decreased by Dr. Harrington Challenger last year.  I spoke with [facility] and sent for 30 more tables (15 days) to hold him until his mail order medicine comes in. I also called and spoke with [facility] pharmacist. She changed his clonidine to 0.1 mg BID, 90 day supply, 3 refills.  Pt is appreciative for this assistance.

## 2015-03-14 NOTE — Telephone Encounter (Signed)
Follow up      Calling to see if the methyldopa replaces the clonidine or is it in addition to.  Please call

## 2015-03-14 NOTE — Telephone Encounter (Signed)
Left detailed voicemail on [facility] line that patient is to take both Methyldopa 500 mg 2 in AM, 1 midday and 2 in PM, and clonidine 0.1 mg BID.

## 2015-03-15 DIAGNOSIS — M545 Low back pain: Secondary | ICD-10-CM | POA: Diagnosis not present

## 2015-03-15 NOTE — Telephone Encounter (Signed)
Pt hs done fairly well on this  Has had problems with meds in past I would like to keep him on this.

## 2015-03-19 ENCOUNTER — Telehealth: Payer: Self-pay

## 2015-03-19 NOTE — Telephone Encounter (Signed)
Methyldopa '500mg'$  approved through [facility]. Good through 01/12/2016.

## 2015-03-20 MED FILL — METHYLDOPA 500 MG TABLET: 500 | 10 days supply | Qty: 50 | Fill #1

## 2015-04-01 ENCOUNTER — Other Ambulatory Visit: Payer: Self-pay | Admitting: *Deleted

## 2015-04-01 DIAGNOSIS — I10 Essential (primary) hypertension: Secondary | ICD-10-CM

## 2015-04-01 DIAGNOSIS — R531 Weakness: Secondary | ICD-10-CM

## 2015-04-01 DIAGNOSIS — R5383 Other fatigue: Secondary | ICD-10-CM

## 2015-04-01 MED ORDER — CLONIDINE HCL 0.1 MG PO TABS: 0.1000 mg | ORAL_TABLET | Freq: Two times a day (BID) | ORAL | Status: DC

## 2015-04-01 MED ORDER — METHYLDOPA 500 MG PO TABS: ORAL_TABLET | ORAL | Status: DC

## 2015-04-01 MED ORDER — AMLODIPINE BESYLATE 5 MG PO TABS: 5.0000 mg | ORAL_TABLET | Freq: Two times a day (BID) | ORAL | Status: DC

## 2015-04-01 MED ORDER — ATORVASTATIN CALCIUM 40 MG PO TABS: 40.0000 mg | ORAL_TABLET | Freq: Every day | ORAL | Status: DC

## 2015-04-03 ENCOUNTER — Encounter: Payer: Self-pay | Admitting: Vascular Surgery

## 2015-04-09 ENCOUNTER — Ambulatory Visit (HOSPITAL_COMMUNITY)
Admission: RE | Admit: 2015-04-09 | Discharge: 2015-04-09 | Disposition: A | Discharge status: Home / Self Care | Payer: PPO | Source: Ambulatory Visit | Attending: Vascular Surgery | Admitting: Vascular Surgery

## 2015-04-09 ENCOUNTER — Other Ambulatory Visit: Payer: Self-pay | Admitting: *Deleted

## 2015-04-09 ENCOUNTER — Encounter: Payer: Self-pay | Admitting: Vascular Surgery

## 2015-04-09 ENCOUNTER — Ambulatory Visit (INDEPENDENT_AMBULATORY_CARE_PROVIDER_SITE_OTHER): Payer: PPO | Admitting: Vascular Surgery

## 2015-04-09 VITALS — BP 125/62 | HR 52 | Temp 97.3°F | Resp 18 | Ht 71.0 in | Wt 175.3 lb

## 2015-04-09 DIAGNOSIS — K219 Gastro-esophageal reflux disease without esophagitis: Secondary | ICD-10-CM | POA: Diagnosis not present

## 2015-04-09 DIAGNOSIS — E785 Hyperlipidemia, unspecified: Secondary | ICD-10-CM | POA: Diagnosis not present

## 2015-04-09 DIAGNOSIS — I6523 Occlusion and stenosis of bilateral carotid arteries: Secondary | ICD-10-CM

## 2015-04-09 DIAGNOSIS — I6521 Occlusion and stenosis of right carotid artery: Secondary | ICD-10-CM

## 2015-04-09 DIAGNOSIS — I10 Essential (primary) hypertension: Secondary | ICD-10-CM | POA: Diagnosis not present

## 2015-04-09 DIAGNOSIS — I6522 Occlusion and stenosis of left carotid artery: Secondary | ICD-10-CM | POA: Insufficient documentation

## 2015-04-09 NOTE — Progress Notes (Signed)
History of Present Illness:  Patient is a 82 y.o. year old male who presents for evaluation of carotid stenosis.  He is s/p right CEA 2012 by Dr. Donnetta Hutching.  He reports no weakness in his extremities, no amorous fugax, no speech or swallowing difficulties.   He states he does fell tired all the time and his wife states he sleeps 11 hours a day since Aug. 2017.  Other medical problems include has Vitamin D deficiency; HLD (hyperlipidemia); ANEMIA; ANXIETY, MILD; HEADACHE, TENSION; HYPERTENSION, BENIGN ESSENTIAL; Cerebral artery occlusion with cerebral infarction ([place]); Allergic rhinitis; BURSITIS, LEFT SHOULDER; URINARY INCONTINENCE, MALE; PROSTATE CANCER, HX OF; Chronic pain; Carotid stenosis; Asthma; and Umbilical hernia on his problem list.  Past Medical History  Diagnosis Date  . HTN (hypertension)     Negative renal duplex 07-22-11  . Stroke [facility]) 8/01    right thalamic - on chronic Plavix/ASA  . Esophageal stricture     Distal, benign  . Diverticulosis   . GERD (gastroesophageal reflux disease)   . Hemorrhoids   . Allergic rhinitis   . Prostate cancer ([place]) 2000    Seed XRT  . Dyslipidemia   . Chronic pain syndrome     Left shoulder, back  . Carotid stenosis     a. s/p Left CEA 2002;  b. Carotid US 3/16:  patent R CEA, L < 40%  . Asthma   . Anemia   . Hx of echocardiogram     a. Echo 10/11: mod LVH, EF 55-60%  . Pneumonia 07-2013    Past Surgical History  Procedure Laterality Date  . Cervical disc surgery  8/07  . Lumbar disc surgery    . Appendectomy    . Cataract extraction Bilateral   . Insertion prostate radiation seed  2000  . Nasal sinus surgery      x 3  . Carotid endarterectomy Right 01/2000    Social History Social History  Substance Use Topics  . Smoking status: Former Smoker -- 0.25 packs/day for 35 years    Types: Cigarettes    Quit date: 01/12/1978  . Smokeless tobacco: Current User    Types: Chew  . Alcohol Use: No     Comment: Prior alcoholic,  sober since 3990    Family History Family History  Problem Relation Age of Onset  . Lung cancer Brother   . Stroke Sister     Allergies  Allergies  Allergen Reactions  . Oxycodone-Acetaminophen Nausea And Vomiting  . Codeine Nausea Only  . Diovan [Valsartan] Other (See Comments)    hyperkalemia  . Hctz [Hydrochlorothiazide] Other (See Comments)    Swelling and dyspnea   . Oxycodone-Acetaminophen Nausea Only  . Tramadol Other (See Comments)    confussion     Current Outpatient Prescriptions  Medication Sig Dispense Refill  . albuterol (PROVENTIL HFA;VENTOLIN HFA) 108 (90 Base) MCG/ACT inhaler Inhale 1-2 puffs into the lungs every 6 (six) hours as needed for wheezing or shortness of breath. 1 Inhaler 6  . amLODipine (NORVASC) 5 MG tablet Take 1 tablet (5 mg total) by mouth 2 (two) times daily. 180 tablet 1  . aspirin 325 MG tablet Take 325 mg by mouth daily.    [name] Kitchen atorvastatin (LIPITOR) 40 MG tablet Take 1 tablet (40 mg total) by mouth daily. 90 tablet 1  . azithromycin (ZITHROMAX Z-PAK) 250 MG tablet Take 2 pills today, then 1 pill daily until gone. 6 tablet 0  . benzonatate (TESSALON) 200 MG capsule  Take 1 capsule (200 mg total) by mouth 2 (two) times daily as needed for cough. 30 capsule 0  . budesonide-formoterol (SYMBICORT) 160-4.5 MCG/ACT inhaler Inhale 2 puffs into the lungs 2 (two) times daily. 1 Inhaler 0  . cephALEXin (KEFLEX) 500 MG capsule Take 1 capsule (500 mg total) by mouth 4 (four) times daily. Take all of medicine and drink lots of fluids 28 capsule 0  . cetirizine (ZYRTEC) 10 MG tablet Take 10 mg by mouth daily.    . cholecalciferol (VITAMIN D) 1000 UNITS tablet Take 1,000 Units by mouth daily.    . cloNIDine (CATAPRES) 0.1 MG tablet Take 1 tablet (0.1 mg total) by mouth 2 (two) times daily. 180 tablet 1  . clopidogrel (PLAVIX) 75 MG tablet Take 1 tablet (75 mg total) by mouth daily. 90 tablet 3  . docusate sodium (COLACE) 100 MG capsule Take 100 mg by mouth 2  (two) times daily.    . fluticasone (FLONASE) 50 MCG/ACT nasal spray Place 2 sprays into both nostrils daily. 16 g 6  . gabapentin (NEURONTIN) 300 MG capsule Take 1 capsule (300 mg total) by mouth 3 (three) times daily. 270 capsule 1  . methyldopa (ALDOMET) 500 MG tablet Take 2 tablets by mouth in the morning, 1 tablet by mouth mid day and 2 tablets by mouth in the evening 450 tablet 3  . montelukast (SINGULAIR) 10 MG tablet TAKE 1 TABLET EVERY DAY AT BEDTIME 90 tablet 0  . omeprazole (PRILOSEC) 40 MG capsule Take 1 capsule (40 mg total) by mouth daily. 90 capsule 1  . phenazopyridine (PYRIDIUM) 100 MG tablet Take 1 tablet (100 mg total) by mouth 3 (three) times daily as needed for pain. 20 tablet 0  . polyethylene glycol (MIRALAX / GLYCOLAX) packet Take 17 g by mouth as needed for mild constipation.     . predniSONE (DELTASONE) 50 MG tablet Take 1 pill daily for 5 days. 5 tablet 0  . senna-docusate (SENOKOT-S) 8.6-50 MG per tablet Take 1 tablet by mouth daily as needed for mild constipation.     . traMADol (ULTRAM) 50 MG tablet Take 50 mg by mouth every 8 (eight) hours. pain  0  . triamcinolone cream (KENALOG) 0.5 % Apply 1 application topically as needed (for skin irritation).     . Wheat Dextrin (BENEFIBER PO) Take 1 scoop by mouth 2 (two) times daily. Powder- Mix with liquid    . cloNIDine (CATAPRES) 0.1 MG tablet Take 1 tablet (0.1 mg total) by mouth 2 (two) times daily. (Patient not taking: Reported on 04/09/2015) 180 tablet 3  . [DISCONTINUED] valsartan (DIOVAN) 160 MG tablet Take 160 mg by mouth daily.       No current facility-administered medications for this visit.    ROS:   General:  No weight loss, Fever, chills  HEENT: No recent headaches, no nasal bleeding, no visual changes, no sore throat  Neurologic: No dizziness, blackouts, seizures. No recent symptoms of stroke or mini- stroke. No recent episodes of slurred speech, or temporary blindness.  Cardiac: No recent episodes of  chest pain/pressure, no shortness of breath at rest.  No shortness of breath with exertion.  Denies history of atrial fibrillation or irregular heartbeat  Vascular: No history of rest pain in feet.  No history of claudication.  No history of non-healing ulcer, No history of DVT   Pulmonary: No home oxygen, no productive cough, no hemoptysis,  No asthma or wheezing  Musculoskeletal:  '[ ]'$  Arthritis, '[ ]'$  Low  back pain,  '[ ]'$  Joint pain  Hematologic:No history of hypercoagulable state.  No history of easy bleeding.  No history of anemia  Gastrointestinal: No hematochezia or melena,  No gastroesophageal reflux, no trouble swallowing  Urinary: '[ ]'$  chronic Kidney disease, '[ ]'$  on HD - '[ ]'$  MWF or '[ ]'$  TTHS, '[ ]'$  Burning with urination, '[ ]'$  Frequent urination, '[ ]'$  Difficulty urinating;   Skin: No rashes  Psychological: No history of anxiety,  No history of depression   Physical Examination  Filed Vitals:   04/09/15 1510  BP: 125/62  Pulse: 52  Temp: 97.3 F (36.3 C)  TempSrc: Oral  Resp: 18  Height: '5\' 11"'$  (1.803 m)  Weight: 175 lb 4.8 oz (79.516 kg)  SpO2: 96%    Body mass index is 24.46 kg/(m^2).  General:  Alert and oriented, no acute distress HEENT: Normal Neck: No bruit or JVD Pulmonary: Clear to auscultation bilaterally Cardiac: Regular Rate and Rhythm without murmur Abdomen: Soft, non-tender, non-distended, no mass, no scars Skin: No rash, well healed right neck incision Extremity Pulses:  2+ radial, brachial Musculoskeletal: No deformity or edema  Neurologic: Upper and lower extremity motor 5/5 and symmetric  DATA:   Carotid duplex shows patent right CEA with < 40% stenosis bilaterally.   ASSESSMENT:   Carotid stenosis s/p right CEA 2012   PLAN:  Continue activity as tolerates.  F/u in 1 year for repeat carotid duplex bilaterally.  He will report his recent low energy to his primary physician and hi cardiologist.  He has a f/u appt. With Dr. Harrington Challenger in 2  weeks.  Theda Sers EMMA [facility] PA-C [facility] Office: [phone]  The patient was seen in conjunction with Dr. Donnetta Hutching today.  I have examined the patient, reviewed and agree with above.  Curt Jews, MD 04/09/2015 4:00 PM

## 2015-04-11 ENCOUNTER — Telehealth: Payer: Self-pay | Admitting: Internal Medicine

## 2015-04-11 DIAGNOSIS — R001 Bradycardia, unspecified: Secondary | ICD-10-CM

## 2015-04-11 MED FILL — traMADol HCL 50 MG TABS: 50 | 30 days supply | Qty: 90 | Fill #0

## 2015-04-11 NOTE — Telephone Encounter (Signed)
New Message:  Pt is calling in to speak with a nurse about the pt's pulse rate. She says that it has been in the 40's and he doesn't have any energy at all. She is concerned and would like to know what to do.

## 2015-04-11 NOTE — Telephone Encounter (Signed)
Called patient's wife.  [name] HR has been running in 40s few days and he is feeling very tired, no energy.  Some nights sleeping 13 hours.  She checked manually last evening, his pulse was 55. I adv to hold evening dose of clonidine 0.1 mg tonight if his heart rate is in 40s. Adv that I am forwarding to Dr. Harrington Challenger and we will call them back with further recommendations.

## 2015-04-11 NOTE — Telephone Encounter (Signed)
Would cut clonidine down to 1 x per day IN a couple weeks set pt up for 24 hour holter

## 2015-04-15 MED ORDER — CLONIDINE HCL 0.1 MG PO TABS: 0.1000 mg | ORAL_TABLET | Freq: Every day | ORAL | Status: DC

## 2015-04-15 NOTE — Telephone Encounter (Signed)
Informed patient's wife, pt will cut the am dose, and continue to take at night.

## 2015-04-16 ENCOUNTER — Telehealth: Payer: Self-pay | Admitting: Internal Medicine

## 2015-04-16 NOTE — Telephone Encounter (Signed)
[name] advised Dr Harrington Challenger ordered to decrease clonidine to 1X daily. [name] states pt took clonidine this morning, pt's wife advised to give pt clonidine daily in AM until office visit with Dr Harrington Challenger 04/18/15.

## 2015-04-16 NOTE — Telephone Encounter (Signed)
New message      Pt cannot remember which dosage of medication (catapress) he is to cut in half----am dosage or pm dosage. His pulse is 52.  Please call before you leave today

## 2015-04-18 ENCOUNTER — Encounter: Payer: Self-pay | Admitting: Internal Medicine

## 2015-04-18 ENCOUNTER — Ambulatory Visit (INDEPENDENT_AMBULATORY_CARE_PROVIDER_SITE_OTHER): Payer: PPO | Admitting: Internal Medicine

## 2015-04-18 VITALS — BP 114/78 | HR 57 | Ht 71.0 in | Wt 174.4 lb

## 2015-04-18 DIAGNOSIS — R001 Bradycardia, unspecified: Secondary | ICD-10-CM

## 2015-04-18 DIAGNOSIS — I10 Essential (primary) hypertension: Secondary | ICD-10-CM

## 2015-04-18 LAB — CBC
HEMATOCRIT: 38.4 % — AB (ref 38.5–50.0)
HEMOGLOBIN: 13.2 g/dL (ref 13.2–17.1)
MCH: 29.9 pg (ref 27.0–33.0)
MCHC: 34.4 g/dL (ref 32.0–36.0)
MCV: 87.1 fL (ref 80.0–100.0)
MPV: 8.9 fL (ref 7.5–12.5)
Platelets: 230 10*3/uL (ref 140–400)
RBC: 4.41 MIL/uL (ref 4.20–5.80)
RDW: 14.5 % (ref 11.0–15.0)
WBC: 6.5 10*3/uL (ref 3.8–10.8)

## 2015-04-18 LAB — TSH: TSH: 3.89 m[IU]/L (ref 0.40–4.50)

## 2015-04-18 NOTE — Progress Notes (Signed)
Cardiology Office Note   Date:  04/18/2015   ID:  [name], DOB Jun 04, 1929, MRN [id number]  PCP:  Penni Homans, MD  Cardiologist:   [name], MD   F?u of HTN     History of Present Illness: [name] is a 82 y.o. male with a history of HTN, reactive airway dz, prior CVA  CV dz (s/o L CEA), HL   I saw him in October 2016  Pt feels lousy  Doenst feel like moving  Tired  Sleeps a lot  ALL the time   Occasional dizziness NO CP   Breatihg OK now  Occasional wheeze      Outpatient Prescriptions Prior to Visit  Medication Sig Dispense Refill  . albuterol (PROVENTIL HFA;VENTOLIN HFA) 108 (90 Base) MCG/ACT inhaler Inhale 1-2 puffs into the lungs every 6 (six) hours as needed for wheezing or shortness of breath. 1 Inhaler 6  . amLODipine (NORVASC) 5 MG tablet Take 1 tablet (5 mg total) by mouth 2 (two) times daily. 180 tablet 1  . aspirin 325 MG tablet Take 325 mg by mouth daily.    [name] Kitchen atorvastatin (LIPITOR) 40 MG tablet Take 1 tablet (40 mg total) by mouth daily. 90 tablet 1  . cetirizine (ZYRTEC) 10 MG tablet Take 10 mg by mouth daily.    . cholecalciferol (VITAMIN D) 1000 UNITS tablet Take 1,000 Units by mouth daily.    . cloNIDine (CATAPRES) 0.1 MG tablet Take 1 tablet (0.1 mg total) by mouth daily. 180 tablet 1  . clopidogrel (PLAVIX) 75 MG tablet Take 1 tablet (75 mg total) by mouth daily. 90 tablet 3  . docusate sodium (COLACE) 100 MG capsule Take 100 mg by mouth 2 (two) times daily.    . fluticasone (FLONASE) 50 MCG/ACT nasal spray Place 2 sprays into both nostrils daily. 16 g 6  . gabapentin (NEURONTIN) 300 MG capsule Take 1 capsule (300 mg total) by mouth 3 (three) times daily. 270 capsule 1  . methyldopa (ALDOMET) 500 MG tablet Take 2 tablets by mouth in the morning, 1 tablet by mouth mid day and 2 tablets by mouth in the evening 450 tablet 3  . montelukast (SINGULAIR) 10 MG tablet TAKE 1 TABLET EVERY DAY AT BEDTIME 90 tablet 0  . omeprazole (PRILOSEC) 40 MG capsule  Take 1 capsule (40 mg total) by mouth daily. 90 capsule 1  . phenazopyridine (PYRIDIUM) 100 MG tablet Take 1 tablet (100 mg total) by mouth 3 (three) times daily as needed for pain. 20 tablet 0  . polyethylene glycol (MIRALAX / GLYCOLAX) packet Take 17 g by mouth as needed for mild constipation.     . senna-docusate (SENOKOT-S) 8.6-50 MG per tablet Take 1 tablet by mouth daily as needed for mild constipation.     . traMADol (ULTRAM) 50 MG tablet Take 50 mg by mouth every 8 (eight) hours. pain  0  . triamcinolone cream (KENALOG) 0.5 % Apply 1 application topically as needed (for skin irritation).     . Wheat Dextrin (BENEFIBER PO) Take 1 scoop by mouth 2 (two) times daily. Powder- Mix with liquid    . azithromycin (ZITHROMAX Z-PAK) 250 MG tablet Take 2 pills today, then 1 pill daily until gone. (Patient not taking: Reported on 04/18/2015) 6 tablet 0  . benzonatate (TESSALON) 200 MG capsule Take 1 capsule (200 mg total) by mouth 2 (two) times daily as needed for cough. 30 capsule 0  . budesonide-formoterol (SYMBICORT) 160-4.5 MCG/ACT inhaler Inhale  2 puffs into the lungs 2 (two) times daily. (Patient not taking: Reported on 04/18/2015) 1 Inhaler 0  . cephALEXin (KEFLEX) 500 MG capsule Take 1 capsule (500 mg total) by mouth 4 (four) times daily. Take all of medicine and drink lots of fluids (Patient not taking: Reported on 04/18/2015) 28 capsule 0  . predniSONE (DELTASONE) 50 MG tablet Take 1 pill daily for 5 days. (Patient not taking: Reported on 04/18/2015) 5 tablet 0   No facility-administered medications prior to visit.     Allergies:   Diovan; Hctz; Oxycodone-acetaminophen; Codeine; Oxycodone-acetaminophen; and Tramadol   Past Medical History  Diagnosis Date  . HTN (hypertension)     Negative renal duplex 07-22-11  . 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 Esophageal stricture     Distal, benign  . Diverticulosis   . GERD (gastroesophageal reflux disease)   . Hemorrhoids    . Allergic rhinitis   . Prostate cancer ([place]) 2000    Seed XRT  . Dyslipidemia   . Chronic pain syndrome     Left shoulder, back  . Carotid stenosis     a. s/p Left CEA 2002;  b. Carotid US 3/16:  patent R CEA, L < 40%  . Asthma   . Anemia   . Hx of echocardiogram     a. Echo 10/11: mod LVH, EF 55-60%  . Pneumonia 07-2013    Past Surgical History  Procedure Laterality Date  . Cervical disc surgery  8/07  . Lumbar disc surgery    . Appendectomy    . Cataract extraction Bilateral   . Insertion prostate radiation seed  2000  . Nasal sinus surgery      x 3  . Carotid endarterectomy Right 01/2000     Social History:  The patient  reports that he quit smoking about 37 years ago. His smoking use included Cigarettes. He has a 8.75 pack-year smoking history. His smokeless tobacco use includes Chew. He reports that he does not drink alcohol or use illicit drugs.   Family History:  The patient's family history includes Lung cancer in his brother; Stroke in his sister.    ROS:  Please see the history of present illness. All other systems are reviewed and  Negative to the above problem except as noted.    PHYSICAL EXAM: VS:  BP 114/78 mmHg  Pulse 57  Ht '5\' 11"'$  (1.803 m)  Wt 174 lb 6.4 oz (79.107 kg)  BMI 24.33 kg/m2  GEN: Well nourished, well developed, in no acute distress HEENT: normal Neck: no JVD, carotid bruits, or masses Cardiac: RRR; no murmurs, rubs, or gallops,no edema  Respiratory:  clear to auscultation bilaterally, normal work of breathing GI: soft, nontender, nondistended, + BS  No hepatomegaly  MS: no deformity Moving all extremities   Skin: warm and dry, no rash Neuro:  Strength and sensation are intact Psych: euthymic mood, full affect   EKG:  EKG is ordered today.    SB with with PACs    57 bpm  Septal MI  LAFB     Lipid Panel    Component Value Date/Time   CHOL 97 12/10/2014 0938   TRIG 81.0 12/10/2014 0938   HDL 32.40* 12/10/2014 0938   CHOLHDL 3  12/10/2014 0938   VLDL 16.2 12/10/2014 0938   LDLCALC 48 12/10/2014 0938      Wt Readings from Last 3 Encounters:  04/18/15 174 lb 6.4 oz (79.107 kg)  04/09/15 175 lb 4.8 oz (79.516 kg)  12/10/14 177 lb 8 oz (80.513 kg)      ASSESSMENT AND PLAN:  1  Fatigue  Not specific  Sleeps a lot  But then able to work in yard   I will check CBC and TSH  Also set up for 24 hour holter Would recomm that he stop clonidine  2.  HTN  Pts wife says BP is labie  160s in afternoon  I woud keep on same and stop clonidine  Follow  3.  HL  Contiue lipitor  LDL in NOvember was 48  4  CV dz  Followede by T Early  CV scan looked good in March     Signed, [name], MD  04/18/2015 9:36 AM    [facility] [address], [place], [place]  [address] Phone: [phone]; Fax: [phone]

## 2015-04-18 NOTE — Patient Instructions (Signed)
Your physician has recommended you make the following change in your medication:  1.) stop Clonidine --heart rate  Your physician recommends that you return for lab work in: today (CBC, TSH)  Your physician has recommended that you wear a holter monitor. Holter monitors are medical devices that record the heart's electrical activity. Doctors most often use these monitors to diagnose arrhythmias. Arrhythmias are problems with the speed or rhythm of the heartbeat. The monitor is a small, portable device. You can wear one while you do your normal daily activities. This is usually used to diagnose what is causing palpitations/syncope (passing out). --PLEASE SCHEDULE IN ABOUT 1 WEEK  Your physician wants you to follow-up in: [place].  You will receive a reminder letter in the mail two months in advance. If you don't receive a letter, please call our office to schedule the follow-up appointment.

## 2015-04-19 ENCOUNTER — Telehealth: Payer: Self-pay | Admitting: Internal Medicine

## 2015-04-19 NOTE — Telephone Encounter (Signed)
NEW MESSAGE   Pt wife is calling for rn to return her call about 04/18/15 appt

## 2015-04-19 NOTE — Telephone Encounter (Signed)
Pts wife calling to let Dr Harrington Challenger know that she started the pts Clonidine back, for his BP went up too 183/94 and HR-56, and he started having a headache with this.  Wife states that she reintroduced the pts Clonidine for HTN and rechecked his VS 30 minutes later and it started coming down to BP-170/70 and HR-62.   Wife states the pts HA has improved too.  Wife states that she doesn't feel comfortable discontinuing the pts Clonidine, for he has had a stroke in the past.  Wife states she is an Therapist, sports and she will closely monitor the pt, especially when administering his Clonidine.  Wife did state she understood why Dr Harrington Challenger d/c'ed this med for bradycardia, but "the benefits outweigh the risk."  Wife verbalizes concerns over the pt having another stroke. Wife just wants me too make Dr Harrington Challenger aware of this, and ask if she has any further recommendations.  Informed the pts wife that Dr Harrington Challenger is currently out of the office but I will route this message to her for further review and recommendation and someone will follow-up with her thereafter.  Advised the wife if the pt becomes acute or his rate drops from Catapres use, then she should notify our on-call or seek immediate medical attention for the pt.  Advised the wife to continue monitoring his VS.  Wife verbalized understanding and agrees with this plan.  Wife gracious for all the assistance provided.

## 2015-04-22 ENCOUNTER — Telehealth: Payer: Self-pay | Admitting: *Deleted

## 2015-04-22 NOTE — Telephone Encounter (Signed)
Pt notified of lab results by phone with verbal understanding. Pt asked for results to be mailed to him . I verified pt address with him.

## 2015-04-24 MED FILL — METHYLDOPA 500 MG TABLET: 500 | 30 days supply | Qty: 150 | Fill #2

## 2015-04-25 ENCOUNTER — Other Ambulatory Visit: Payer: Self-pay | Admitting: Internal Medicine

## 2015-04-25 ENCOUNTER — Ambulatory Visit (INDEPENDENT_AMBULATORY_CARE_PROVIDER_SITE_OTHER): Payer: PPO

## 2015-04-25 DIAGNOSIS — R42 Dizziness and giddiness: Secondary | ICD-10-CM | POA: Diagnosis not present

## 2015-04-25 DIAGNOSIS — R001 Bradycardia, unspecified: Secondary | ICD-10-CM | POA: Diagnosis not present

## 2015-05-01 ENCOUNTER — Telehealth: Payer: Self-pay | Admitting: Pulmonary Disease

## 2015-05-01 MED ORDER — MONTELUKAST SODIUM 10 MG PO TABS: 10.0000 mg | ORAL_TABLET | Freq: Every day | ORAL | Status: DC

## 2015-05-01 MED FILL — MONTELUKAST SOD 10 MG TAB: 10 | 90 days supply | Qty: 90 | Fill #0

## 2015-05-01 NOTE — Telephone Encounter (Signed)
Spoke with pt. Rx has been sent in. ROV has been scheduled for 06/25/2015 at 12pm. Nothing further was needed.

## 2015-05-07 NOTE — Telephone Encounter (Signed)
Left a message on home voice mail that I wanted to know if patient is taking clonidine once a day or twice a day and how he has been doing since restarting it.   Advised to call back and provide this information, ok to leave a message for me if i'm unavailable.

## 2015-05-07 NOTE — Telephone Encounter (Signed)
Spoke with patient and his wife. Pt was given clonidine 0.1 mg JUST ONCE when his BP was elevated when his wife checked it. Otherwise, he has been off clonidine, as Dr. Harrington Challenger has recommended.  Adv to monitor BP and HR, keep a list and call if it is consistently high.  (pt's wife is retired Marine scientist)  She verbalizes understanding and agreement with this.  They would like to know the results of holter monitor. Advised it needs reviewed by Dr. Harrington Challenger and we will call with results.  Requests we call on cell #.

## 2015-05-08 NOTE — Telephone Encounter (Signed)
I have reviewed holter (was filed without review) Average HR is 58  There were no significant pauses  No significant low heart rates  Occasional palpitations  I would keep on same meds

## 2015-05-10 MED FILL — traMADol HCL 50 MG TABS: 50 | 30 days supply | Qty: 90 | Fill #0

## 2015-05-10 NOTE — Telephone Encounter (Signed)
Spoke with patient and informed of holter results. He verbalizes understanding. States he is aware when his rate drops below 50s, he is very fatigued.  He has remained off clonidine at this time. Advised to call office with any changes/further concerns.

## 2015-05-14 ENCOUNTER — Other Ambulatory Visit: Payer: Self-pay | Admitting: *Deleted

## 2015-05-14 MED ORDER — CLOPIDOGREL BISULFATE 75 MG PO TABS: 75.0000 mg | ORAL_TABLET | Freq: Every day | ORAL | Status: DC

## 2015-05-14 MED FILL — CLOPIDOGREL 75 MG TABLET: 75 | 90 days supply | Qty: 90 | Fill #0

## 2015-05-20 ENCOUNTER — Telehealth: Payer: Self-pay | Admitting: Internal Medicine

## 2015-05-20 NOTE — Telephone Encounter (Signed)
Spoke with pt's wife. She is calling to report pt's average heart rate is 54. High of 60 today after working in the yard. Lowest heart rate is 52.  Pt sleeps a lot. Wife states he is worried about heart rate.  No other symptoms.  Recent monitor shows average heart rate of 58 and no changes made based on these results. I told pt's wife I would forward this information to Dr. Harrington Challenger and we would call back if any changes recommended.

## 2015-05-20 NOTE — Telephone Encounter (Signed)
New message   Pt wife is calling to Have rn call her back about pt Heart Rate   Patient c/o Palpitations:  High priority if patient c/o lightheadedness and shortness of breath.  1. How long have you been having palpitations? 180/80  05/20/15   2. Are you currently experiencing lightheadedness and shortness of breath? no  3. Have you checked your BP and heart rate? (document readings) low 50 4. Are you experiencing any other symptoms? None just HR low

## 2015-05-21 NOTE — Telephone Encounter (Signed)
Spoke to patients wife   BP is OK  Occasionally high Reassured patients wife that his HR on holter monitor recently was ok   Not dizzy

## 2015-05-24 MED FILL — METHYLDOPA 500 MG TABLET: 500 | 10 days supply | Qty: 50 | Fill #3

## 2015-05-27 ENCOUNTER — Other Ambulatory Visit: Payer: Self-pay | Admitting: Internal Medicine

## 2015-06-03 MED FILL — METHYLDOPA 500 MG TABLET: 500 | 20 days supply | Qty: 100 | Fill #0

## 2015-06-04 ENCOUNTER — Encounter: Payer: Self-pay | Admitting: Family Medicine

## 2015-06-04 ENCOUNTER — Ambulatory Visit (INDEPENDENT_AMBULATORY_CARE_PROVIDER_SITE_OTHER): Payer: PPO | Admitting: Family Medicine

## 2015-06-04 VITALS — BP 144/62 | HR 56 | Temp 98.3°F | Ht 71.0 in | Wt 172.0 lb

## 2015-06-04 DIAGNOSIS — E782 Mixed hyperlipidemia: Secondary | ICD-10-CM | POA: Diagnosis not present

## 2015-06-04 DIAGNOSIS — D649 Anemia, unspecified: Secondary | ICD-10-CM

## 2015-06-04 DIAGNOSIS — R5383 Other fatigue: Secondary | ICD-10-CM

## 2015-06-04 DIAGNOSIS — E559 Vitamin D deficiency, unspecified: Secondary | ICD-10-CM | POA: Diagnosis not present

## 2015-06-04 DIAGNOSIS — E785 Hyperlipidemia, unspecified: Secondary | ICD-10-CM

## 2015-06-04 DIAGNOSIS — J4540 Moderate persistent asthma, uncomplicated: Secondary | ICD-10-CM

## 2015-06-04 DIAGNOSIS — I6350 Cerebral infarction due to unspecified occlusion or stenosis of unspecified cerebral artery: Secondary | ICD-10-CM

## 2015-06-04 DIAGNOSIS — I10 Essential (primary) hypertension: Secondary | ICD-10-CM

## 2015-06-04 DIAGNOSIS — J209 Acute bronchitis, unspecified: Secondary | ICD-10-CM

## 2015-06-04 DIAGNOSIS — R531 Weakness: Secondary | ICD-10-CM

## 2015-06-04 MED ORDER — TRAMADOL HCL 50 MG PO TABS: 50.0000 mg | ORAL_TABLET | Freq: Three times a day (TID) | ORAL | Status: DC

## 2015-06-04 MED ORDER — AZITHROMYCIN 250 MG PO TABS: ORAL_TABLET | ORAL | Status: DC

## 2015-06-04 MED ORDER — GABAPENTIN 300 MG PO CAPS: 300.0000 mg | ORAL_CAPSULE | Freq: Three times a day (TID) | ORAL | Status: DC

## 2015-06-04 MED ORDER — ATORVASTATIN CALCIUM 40 MG PO TABS: 40.0000 mg | ORAL_TABLET | Freq: Every day | ORAL | Status: DC

## 2015-06-04 MED FILL — AZITHROMYCIN 250 MG TABLET: 250 | 5 days supply | Qty: 6 | Fill #0

## 2015-06-04 MED FILL — GABAPENTIN 300 MG CAPSULE: 300 | 90 days supply | Qty: 270 | Fill #0

## 2015-06-04 NOTE — Progress Notes (Signed)
Pre visit review using our clinic review tool, if applicable. No additional management support is needed unless otherwise documented below in the visit note. 

## 2015-06-04 NOTE — Patient Instructions (Signed)
Mucinex plain twice daily x 10 days, consider Zinc such as Xicam or Coldeeze and Elderberry for respiratory illness 64 oz of clear daily   Acute Bronchitis Bronchitis is inflammation of the airways that extend from the windpipe into the lungs (bronchi). The inflammation often causes mucus to develop. This leads to a cough, which is the most common symptom of bronchitis.  In acute bronchitis, the condition usually develops suddenly and goes away over time, usually in a couple weeks. Smoking, allergies, and asthma can make bronchitis worse. Repeated episodes of bronchitis may cause further lung problems.  CAUSES Acute bronchitis is most often caused by the same virus that causes a cold. The virus can spread from person to person (contagious) through coughing, sneezing, and touching contaminated objects. SIGNS AND SYMPTOMS   Cough.   Fever.   Coughing up mucus.   Body aches.   Chest congestion.   Chills.   Shortness of breath.   Sore throat.  DIAGNOSIS  Acute bronchitis is usually diagnosed through a physical exam. Your health care provider will also ask you questions about your medical history. Tests, such as chest X-rays, are sometimes done to rule out other conditions.  TREATMENT  Acute bronchitis usually goes away in a couple weeks. Oftentimes, no medical treatment is necessary. Medicines are sometimes given for relief of fever or cough. Antibiotic medicines are usually not needed but may be prescribed in certain situations. In some cases, an inhaler may be recommended to help reduce shortness of breath and control the cough. A cool mist vaporizer may also be used to help thin bronchial secretions and make it easier to clear the chest.  HOME CARE INSTRUCTIONS  Get plenty of rest.   Drink enough fluids to keep your urine clear or pale yellow (unless you have a medical condition that requires fluid restriction). Increasing fluids may help thin your respiratory secretions  (sputum) and reduce chest congestion, and it will prevent dehydration.   Take medicines only as directed by your health care provider.  If you were prescribed an antibiotic medicine, finish it all even if you start to feel better.  Avoid smoking and secondhand smoke. Exposure to cigarette smoke or irritating chemicals will make bronchitis worse. If you are a smoker, consider using nicotine gum or skin patches to help control withdrawal symptoms. Quitting smoking will help your lungs heal faster.   Reduce the chances of another bout of acute bronchitis by washing your hands frequently, avoiding people with cold symptoms, and trying not to touch your hands to your mouth, nose, or eyes.   Keep all follow-up visits as directed by your health care provider.  SEEK MEDICAL CARE IF: Your symptoms do not improve after 1 week of treatment.  SEEK IMMEDIATE MEDICAL CARE IF:  You develop an increased fever or chills.   You have chest pain.   You have severe shortness of breath.  You have bloody sputum.   You develop dehydration.  You faint or repeatedly feel like you are going to pass out.  You develop repeated vomiting.  You develop a severe headache. MAKE SURE YOU:   Understand these instructions.  Will watch your condition.  Will get help right away if you are not doing well or get worse.   This information is not intended to replace advice given to you by your health care provider. Make sure you discuss any questions you have with your health care provider.   Document Released: 02/06/2004 Document Revised: 01/19/2014 Document Reviewed: 06/21/2012  Chartered certified accountant Patient Education [employer].

## 2015-06-05 LAB — COMPREHENSIVE METABOLIC PANEL
ALBUMIN: 4 g/dL (ref 3.5–5.2)
ALT: 9 U/L (ref 0–53)
AST: 12 U/L (ref 0–37)
Alkaline Phosphatase: 53 U/L (ref 39–117)
BILIRUBIN TOTAL: 0.3 mg/dL (ref 0.2–1.2)
BUN: 22 mg/dL (ref 6–23)
CALCIUM: 8.8 mg/dL (ref 8.4–10.5)
CO2: 28 mEq/L (ref 19–32)
CREATININE: 0.89 mg/dL (ref 0.40–1.50)
Chloride: 108 mEq/L (ref 96–112)
GFR: 86.6 mL/min (ref >60.00)
Glucose, Bld: 83 mg/dL (ref 70–99)
Potassium: 4.8 mEq/L (ref 3.5–5.1)
Sodium: 140 mEq/L (ref 135–145)
Total Protein: 6.2 g/dL (ref 6.0–8.3)

## 2015-06-05 LAB — CBC
HEMATOCRIT: 38.6 % — AB (ref 39.0–52.0)
Hemoglobin: 12.9 g/dL — ABNORMAL LOW (ref 13.0–17.0)
MCHC: 33.4 g/dL (ref 30.0–36.0)
MCV: 90.6 fl (ref 78.0–100.0)
Platelets: 224 10*3/uL (ref 150.0–400.0)
RBC: 4.26 Mil/uL (ref 4.22–5.81)
RDW: 14.5 % (ref 11.5–15.5)
WBC: 6.3 10*3/uL (ref 4.0–10.5)

## 2015-06-05 LAB — LIPID PANEL
CHOLESTEROL: 100 mg/dL (ref 0–200)
HDL: 31.2 mg/dL — AB (ref >39.00)
LDL Cholesterol: 46 mg/dL (ref 0–99)
NonHDL: 68.45
TRIGLYCERIDES: 112 mg/dL (ref 0.0–149.0)
Total CHOL/HDL Ratio: 3
VLDL: 22.4 mg/dL (ref 0.0–40.0)

## 2015-06-05 LAB — TSH: TSH: 3.44 u[IU]/mL (ref 0.35–4.50)

## 2015-06-05 LAB — VITAMIN D 25 HYDROXY (VIT D DEFICIENCY, FRACTURES): VITD: 19.36 ng/mL — ABNORMAL LOW (ref 30.00–100.00)

## 2015-06-06 ENCOUNTER — Other Ambulatory Visit: Payer: Self-pay | Admitting: Family Medicine

## 2015-06-06 MED ORDER — VITAMIN D (ERGOCALCIFEROL) 1.25 MG (50000 UNIT) PO CAPS: 50000.0000 [IU] | ORAL_CAPSULE | ORAL | Status: DC

## 2015-06-06 MED FILL — VIT D2 1.25 MG (50,000 UNIT: 1.25 MG | 84 days supply | Qty: 12 | Fill #0

## 2015-06-06 MED FILL — traMADol HCL 50 MG TABS: 50 | 30 days supply | Qty: 90 | Fill #0

## 2015-06-10 ENCOUNTER — Encounter: Payer: Self-pay | Admitting: Family Medicine

## 2015-06-10 DIAGNOSIS — J209 Acute bronchitis, unspecified: Secondary | ICD-10-CM | POA: Insufficient documentation

## 2015-06-10 HISTORY — DX: Acute bronchitis, unspecified: J20.9

## 2015-06-10 NOTE — Progress Notes (Signed)
Patient ID: [name], male   DOB: 10-30-1929, 82 y.o.   MRN: [id number]   Subjective:    Patient ID: [name], male    DOB: [birth year], 82 y.o.   MRN: [id number]  Chief Complaint  Patient presents with  . Establish Care    HPI Patient is in today for follow up. He is struggling with worsening congestion, cough and cough is productive of yellow sputum. Notes some low grade fevers and chills the past few days, has noted some sob with exertion, malaise and fatigue. Follows with Dr Nelva Bush of pain management and Dr Ardis Hughs of gastroenteorlogy  Past Medical History  Diagnosis Date  . HTN (hypertension)     Negative renal duplex 07-22-11  . 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 Esophageal stricture     Distal, benign  . Diverticulosis   . GERD (gastroesophageal reflux disease)   . Hemorrhoids   . Allergic rhinitis   . Prostate cancer ([place]) 2000    Seed XRT  . Dyslipidemia   . Chronic pain syndrome     Left shoulder, back  . Carotid stenosis     a. s/p Left CEA 2002;  b. Carotid US 3/16:  patent R CEA, L < 40%  . Asthma   . Anemia   . Hx of echocardiogram     a. Echo 10/11: mod LVH, EF 55-60%  . Pneumonia 07-2013  . Acute bronchitis 06/10/2015    Past Surgical History  Procedure Laterality Date  . Cervical disc surgery  8/07  . Lumbar disc surgery    . Appendectomy    . Cataract extraction Bilateral   . Insertion prostate radiation seed  2000  . Nasal sinus surgery      x 3  . Carotid endarterectomy Right 01/2000    Family History  Problem Relation Age of Onset  . Stroke Brother   . Stroke Sister   . Stroke Mother   . Hyperlipidemia Mother   . Hypertension Mother   . Diabetes Son   . Gout Son   . Obesity Sister   . Alcohol abuse Brother   . Cancer Brother     lung, smoker    Social History   Social History  . Marital Status: Married    Spouse Name: N/A  . Number of Children: 1  . Years of Education: N/A   Occupational  History  . retired   .     Social History Main Topics  . Smoking status: Former Smoker -- 0.25 packs/day for 35 years    Types: Cigarettes    Quit date: 01/12/1978  . Smokeless tobacco: Current User    Types: Chew  . Alcohol Use: No     Comment: Prior alcoholic, sober since 3614  . Drug Use: No  . Sexual Activity: Not Currently   Other Topics Concern  . Not on file   Social History Narrative    Outpatient Prescriptions Prior to Visit  Medication Sig Dispense Refill  . albuterol (PROVENTIL HFA;VENTOLIN HFA) 108 (90 Base) MCG/ACT inhaler Inhale 1-2 puffs into the lungs every 6 (six) hours as needed for wheezing or shortness of breath. 1 Inhaler 6  . amLODipine (NORVASC) 5 MG tablet Take 1 tablet (5 mg total) by mouth 2 (two) times daily. 180 tablet 1  . aspirin 325 MG tablet Take 325 mg by mouth daily.    . cetirizine (ZYRTEC) 10 MG tablet Take 10 mg  by mouth daily.    . cholecalciferol (VITAMIN D) 1000 UNITS tablet Take 1,000 Units by mouth daily.    . clopidogrel (PLAVIX) 75 MG tablet Take 1 tablet (75 mg total) by mouth daily. 90 tablet 3  . docusate sodium (COLACE) 100 MG capsule Take 100 mg by mouth 2 (two) times daily.    . fluticasone (FLONASE) 50 MCG/ACT nasal spray Place 2 sprays into both nostrils daily. 16 g 6  . methyldopa (ALDOMET) 500 MG tablet Take 2 tablets by mouth in the morning, 1 tablet by mouth mid day and 2 tablets by mouth in the evening 450 tablet 3  . montelukast (SINGULAIR) 10 MG tablet Take 1 tablet (10 mg total) by mouth at bedtime. 90 tablet 0  . omeprazole (PRILOSEC) 40 MG capsule Take 1 capsule (40 mg total) by mouth daily. 90 capsule 1  . phenazopyridine (PYRIDIUM) 100 MG tablet Take 1 tablet (100 mg total) by mouth 3 (three) times daily as needed for pain. 20 tablet 0  . polyethylene glycol (MIRALAX / GLYCOLAX) packet Take 17 g by mouth as needed for mild constipation.     . senna-docusate (SENOKOT-S) 8.6-50 MG per tablet Take 1 tablet by mouth daily  as needed for mild constipation.     . triamcinolone cream (KENALOG) 0.5 % Apply 1 application topically as needed (for skin irritation).     . Wheat Dextrin (BENEFIBER PO) Take 1 scoop by mouth 2 (two) times daily. Powder- Mix with liquid    . atorvastatin (LIPITOR) 40 MG tablet Take 1 tablet (40 mg total) by mouth daily. 90 tablet 1  . gabapentin (NEURONTIN) 300 MG capsule Take 1 capsule (300 mg total) by mouth 3 (three) times daily. 270 capsule 1  . traMADol (ULTRAM) 50 MG tablet Take 50 mg by mouth every 8 (eight) hours. pain  0   No facility-administered medications prior to visit.    Allergies  Allergen Reactions  . Diovan [Valsartan] Other (See Comments)    Elevated potasium hyperkalemia  . Hctz [Hydrochlorothiazide] Other (See Comments)    Swelling and dyspnea   . Oxycodone-Acetaminophen Nausea And Vomiting  . Codeine Nausea Only  . Oxycodone-Acetaminophen Nausea Only    Review of Systems  Constitutional: Positive for fever and malaise/fatigue.  HENT: Positive for congestion.   Eyes: Negative for blurred vision.  Respiratory: Positive for cough, shortness of breath and wheezing.   Cardiovascular: Negative for chest pain, palpitations and leg swelling.  Gastrointestinal: Negative for nausea, abdominal pain and blood in stool.  Genitourinary: Negative for dysuria and frequency.  Musculoskeletal: Positive for myalgias. Negative for falls.  Skin: Negative for rash.  Neurological: Negative for dizziness, loss of consciousness and headaches.  Endo/Heme/Allergies: Negative for environmental allergies.  Psychiatric/Behavioral: Negative for depression. The patient is nervous/anxious.        Objective:    Physical Exam  Constitutional: He is oriented to person, place, and time. He appears well-developed and well-nourished. No distress.  HENT:  Head: Normocephalic and atraumatic.  Nose: Nose normal.  Eyes: Right eye exhibits no discharge. Left eye exhibits no discharge.    Neck: Normal range of motion. Neck supple.  Cardiovascular: Normal rate and regular rhythm.   No murmur heard. Pulmonary/Chest: Effort normal and breath sounds normal.  Slight rhonchi b/l bases  Abdominal: Soft. Bowel sounds are normal. There is no tenderness.  Musculoskeletal: He exhibits no edema.  Neurological: He is alert and oriented to person, place, and time.  Skin: Skin is warm and  dry.  Psychiatric: He has a normal mood and affect.  Nursing note and vitals reviewed.   BP 144/62 mmHg  Pulse 56  Temp(Src) 98.3 F (36.8 C) (Oral)  Ht '5\' 11"'$  (1.803 m)  Wt 172 lb (78.019 kg)  BMI 24.00 kg/m2  SpO2 95% Wt Readings from Last 3 Encounters:  06/04/15 172 lb (78.019 kg)  04/18/15 174 lb 6.4 oz (79.107 kg)  04/09/15 175 lb 4.8 oz (79.516 kg)     Lab Results  Component Value Date   WBC 6.3 06/04/2015   HGB 12.9* 06/04/2015   HCT 38.6* 06/04/2015   PLT 224.0 06/04/2015   GLUCOSE 83 06/04/2015   CHOL 100 06/04/2015   TRIG 112.0 06/04/2015   HDL 31.20* 06/04/2015   LDLCALC 46 06/04/2015   ALT 9 06/04/2015   AST 12 06/04/2015   NA 140 06/04/2015   K 4.8 06/04/2015   CL 108 06/04/2015   CREATININE 0.89 06/04/2015   BUN 22 06/04/2015   CO2 28 06/04/2015   TSH 3.44 06/04/2015   PSA 0.02* 12/11/2008   INR 0.97 02/06/2010   HGBA1C 6.0* 10/18/2014    Lab Results  Component Value Date   TSH 3.44 06/04/2015   Lab Results  Component Value Date   WBC 6.3 06/04/2015   HGB 12.9* 06/04/2015   HCT 38.6* 06/04/2015   MCV 90.6 06/04/2015   PLT 224.0 06/04/2015   Lab Results  Component Value Date   NA 140 06/04/2015   K 4.8 06/04/2015   CO2 28 06/04/2015   GLUCOSE 83 06/04/2015   BUN 22 06/04/2015   CREATININE 0.89 06/04/2015   BILITOT 0.3 06/04/2015   ALKPHOS 53 06/04/2015   AST 12 06/04/2015   ALT 9 06/04/2015   PROT 6.2 06/04/2015   ALBUMIN 4.0 06/04/2015   CALCIUM 8.8 06/04/2015   ANIONGAP 11 08/11/2013   GFR 86.60 06/04/2015   Lab Results  Component  Value Date   CHOL 100 06/04/2015   Lab Results  Component Value Date   HDL 31.20* 06/04/2015   Lab Results  Component Value Date   LDLCALC 46 06/04/2015   Lab Results  Component Value Date   TRIG 112.0 06/04/2015   Lab Results  Component Value Date   CHOLHDL 3 06/04/2015   Lab Results  Component Value Date   HGBA1C 6.0* 10/18/2014       Assessment & Plan:   Problem List Items Addressed This Visit    Vitamin D deficiency    Encouraged daily supplements and will monitor      Relevant Medications   gabapentin (NEURONTIN) 300 MG capsule   atorvastatin (LIPITOR) 40 MG tablet   Other Relevant Orders   TSH (Completed)   CBC (Completed)   Comprehensive metabolic panel (Completed)   Lipid panel (Completed)   Vitamin D (25 hydroxy) (Completed)   HYPERTENSION, BENIGN ESSENTIAL    Well controlled, no changes to meds. Encouraged heart healthy diet such as the DASH diet and exercise as tolerated.       Relevant Medications   atorvastatin (LIPITOR) 40 MG tablet   traMADol (ULTRAM) 50 MG tablet   HLD (hyperlipidemia)    Tolerating statin, encouraged heart healthy diet, avoid trans fats, minimize simple carbs and saturated fats. Increase exercise as tolerated      Relevant Medications   atorvastatin (LIPITOR) 40 MG tablet   Cerebral artery occlusion with cerebral infarction ([place]) - Primary   Relevant Medications   gabapentin (NEURONTIN) 300 MG capsule   atorvastatin (  LIPITOR) 40 MG tablet   Other Relevant Orders   TSH (Completed)   CBC (Completed)   Comprehensive metabolic panel (Completed)   Lipid panel (Completed)   Asthma    Has not been using Symbicort regularly. Encouraged to use bid while he is ill and consider continuing if symptoms persist. Albuterol prn      ANEMIA    Increase leafy greens, consider increased lean red meat and using cast iron cookware. Continue to monitor, report any concerns      Acute bronchitis    Started on antibiotics, mucinex  Encouraged increased rest and hydration, add probiotics, zinc such as Coldeze or Xicam. Treat fevers as needed       Other Visit Diagnoses    Hyperlipidemia, mixed        Relevant Medications    gabapentin (NEURONTIN) 300 MG capsule    atorvastatin (LIPITOR) 40 MG tablet    Other Relevant Orders    TSH (Completed)    CBC (Completed)    Comprehensive metabolic panel (Completed)    Lipid panel (Completed)    Essential hypertension        Relevant Medications    gabapentin (NEURONTIN) 300 MG capsule    atorvastatin (LIPITOR) 40 MG tablet    Other Relevant Orders    TSH (Completed)    CBC (Completed)    Comprehensive metabolic panel (Completed)    Lipid panel (Completed)    Weakness        Relevant Medications    traMADol (ULTRAM) 50 MG tablet    Other fatigue        Relevant Medications    traMADol (ULTRAM) 50 MG tablet       I have changed Mr. Redwine's traMADol. I am also having him start on azithromycin. Additionally, I am having him maintain his polyethylene glycol, senna-docusate, docusate sodium, triamcinolone cream, Wheat Dextrin (BENEFIBER PO), cetirizine, fluticasone, aspirin, cholecalciferol, omeprazole, phenazopyridine, albuterol, amLODipine, methyldopa, montelukast, clopidogrel, gabapentin, and atorvastatin.  Meds ordered this encounter  Medications  . gabapentin (NEURONTIN) 300 MG capsule    Sig: Take 1 capsule (300 mg total) by mouth 3 (three) times daily.    Dispense:  270 capsule    Refill:  1  . atorvastatin (LIPITOR) 40 MG tablet    Sig: Take 1 tablet (40 mg total) by mouth daily.    Dispense:  90 tablet    Refill:  1  . azithromycin (ZITHROMAX) 250 MG tablet    Sig: 2 tabs po once then 1 tab po daily x 4 days    Dispense:  6 tablet    Refill:  0  . traMADol (ULTRAM) 50 MG tablet    Sig: Take 1 tablet (50 mg total) by mouth every 8 (eight) hours. pain    Dispense:  90 tablet    Refill:  0     Penni Homans, MD

## 2015-06-10 NOTE — Assessment & Plan Note (Signed)
Well controlled, no changes to meds. Encouraged heart healthy diet such as the DASH diet and exercise as tolerated.  °

## 2015-06-10 NOTE — Assessment & Plan Note (Signed)
Encouraged daily supplements and will monitor

## 2015-06-10 NOTE — Assessment & Plan Note (Signed)
Increase leafy greens, consider increased lean red meat and using cast iron cookware. Continue to monitor, report any concerns 

## 2015-06-10 NOTE — Assessment & Plan Note (Signed)
Started on antibiotics, mucinex Encouraged increased rest and hydration, add probiotics, zinc such as Coldeze or Xicam. Treat fevers as needed

## 2015-06-10 NOTE — Assessment & Plan Note (Signed)
Has not been using Symbicort regularly. Encouraged to use bid while he is ill and consider continuing if symptoms persist. Albuterol prn

## 2015-06-10 NOTE — Assessment & Plan Note (Signed)
Tolerating statin, encouraged heart healthy diet, avoid trans fats, minimize simple carbs and saturated fats. Increase exercise as tolerated 

## 2015-06-14 ENCOUNTER — Telehealth: Payer: Self-pay | Admitting: Family Medicine

## 2015-06-14 MED ORDER — AZITHROMYCIN 250 MG PO TABS: ORAL_TABLET | ORAL | Status: DC

## 2015-06-14 MED FILL — AZITHROMYCIN 250 MG TABLET: 250 | 5 days supply | Qty: 6 | Fill #0

## 2015-06-14 NOTE — Telephone Encounter (Signed)
OK to refill Azithromycin once if no imrpovement then needs to come in

## 2015-06-14 NOTE — Telephone Encounter (Signed)
Antibiotic sent in and patient informed

## 2015-06-14 NOTE — Telephone Encounter (Signed)
Caller name: Remo Lipps Relationship to patient: Wife Can be reached: [phone]  Pharmacy:  [place], [place] [phone] (Phone) [phone] (Fax)         Reason for call: Wife is requesting another round of Zithromycin. States he is doing better but it has not totally cleared.

## 2015-06-25 ENCOUNTER — Ambulatory Visit: Payer: PPO | Admitting: Pulmonary Disease

## 2015-06-27 MED FILL — METHYLDOPA 500 MG TABLET: 500 | 20 days supply | Qty: 100 | Fill #1

## 2015-07-03 ENCOUNTER — Telehealth: Payer: Self-pay | Admitting: Family Medicine

## 2015-07-03 DIAGNOSIS — R531 Weakness: Secondary | ICD-10-CM

## 2015-07-03 DIAGNOSIS — R5383 Other fatigue: Secondary | ICD-10-CM

## 2015-07-03 DIAGNOSIS — I10 Essential (primary) hypertension: Secondary | ICD-10-CM

## 2015-07-03 NOTE — Telephone Encounter (Signed)
Pt called in because he need a refill on Tramadol. Pt says that [place]. He would like to know if PCP could go ahead and refill .    Pharmacy:  [place], [place] - [place] [phone] (Phone) [phone] (Fax)                CB: [phone]

## 2015-07-04 MED ORDER — TRAMADOL HCL 50 MG PO TABS
50.0000 mg | ORAL_TABLET | Freq: Three times a day (TID) | ORAL | Status: DC
Start: 2015-07-04 — End: 2015-08-01

## 2015-07-04 MED FILL — traMADol HCL 50 MG TABS: 50 | 30 days supply | Qty: 90 | Fill #0

## 2015-07-09 ENCOUNTER — Other Ambulatory Visit: Payer: Self-pay | Admitting: Internal Medicine

## 2015-07-09 MED FILL — FLUTICASONE PROP 50 MCG SPR: 50 | 30 days supply | Qty: 16 | Fill #0

## 2015-07-19 MED FILL — METHYLDOPA 250 MG TABLET: 250 | 5 days supply | Qty: 50 | Fill #0

## 2015-07-22 ENCOUNTER — Other Ambulatory Visit: Payer: Self-pay | Admitting: Pulmonary Disease

## 2015-07-25 ENCOUNTER — Other Ambulatory Visit: Payer: Self-pay | Admitting: *Deleted

## 2015-07-25 MED ORDER — AMLODIPINE BESYLATE 5 MG PO TABS
5.0000 mg | ORAL_TABLET | Freq: Two times a day (BID) | ORAL | Status: DC
Start: 2015-07-25 — End: 2015-10-11

## 2015-07-25 MED FILL — METHYLDOPA 250 MG TABLET: 250 | 90 days supply | Qty: 900 | Fill #1

## 2015-07-25 MED FILL — AMLODIPINE BESYLATE 5 MG TA: 5 | 90 days supply | Qty: 180 | Fill #0

## 2015-07-26 ENCOUNTER — Telehealth: Payer: Self-pay | Admitting: Internal Medicine

## 2015-07-26 NOTE — Telephone Encounter (Signed)
I already got this drug approved for coverage back in March. It is good till  01/12/2016. Not sure why he is on this drug anyway. Maybe Dr. Harrington Challenger would consider changing it?

## 2015-07-26 NOTE — Telephone Encounter (Signed)
Patient wants to know if there is another medication he can take instead of methyyldopa.  He said that it is costing $150/month and he cannot afford it.Please call patient back on his cell phone.

## 2015-07-29 NOTE — Telephone Encounter (Signed)
He has been on this med.   BP is very labile  If covered I would recomm continuing

## 2015-07-31 ENCOUNTER — Other Ambulatory Visit: Payer: Self-pay | Admitting: *Deleted

## 2015-07-31 ENCOUNTER — Other Ambulatory Visit: Payer: Self-pay | Admitting: Family Medicine

## 2015-07-31 ENCOUNTER — Telehealth: Payer: Self-pay | Admitting: Family Medicine

## 2015-07-31 DIAGNOSIS — R5383 Other fatigue: Secondary | ICD-10-CM

## 2015-07-31 DIAGNOSIS — I10 Essential (primary) hypertension: Secondary | ICD-10-CM

## 2015-07-31 DIAGNOSIS — R531 Weakness: Secondary | ICD-10-CM

## 2015-07-31 MED ORDER — CLOPIDOGREL BISULFATE 75 MG PO TABS: 75.0000 mg | ORAL_TABLET | Freq: Every day | ORAL | Status: DC

## 2015-07-31 NOTE — Telephone Encounter (Signed)
Last OV: 06/04/15 Last filled: 07/04/15, #90. 0 RF   Sig: Take 1 tablet (50 mg total) by mouth every 8 (eight) hours. pain UDS: Not on file

## 2015-07-31 NOTE — Telephone Encounter (Signed)
Left detailed message that Dr. Harrington Challenger would prefer that he stay on the [place].  He does have coverage but his copay is still high. Advised that if it is not affordable that he needs to call back so we can discuss alternatives with Dr. Harrington Challenger.

## 2015-07-31 NOTE — Telephone Encounter (Signed)
OK to refill Tramadol, same sig, same number.

## 2015-07-31 NOTE — Telephone Encounter (Signed)
°  Relation to HF:GBMS Call back [phone]   Reason for call:  Patient requesting a refill traMADol (ULTRAM) 50 MG tablet

## 2015-08-01 ENCOUNTER — Other Ambulatory Visit: Payer: Self-pay

## 2015-08-01 MED ORDER — TRAMADOL HCL 50 MG PO TABS: 50.0000 mg | ORAL_TABLET | Freq: Three times a day (TID) | ORAL | Status: DC

## 2015-08-01 MED FILL — traMADol HCL 50 MG TABS: 50 | 30 days supply | Qty: 90 | Fill #0

## 2015-08-01 NOTE — Telephone Encounter (Signed)
Printed, pcp signed and faxed to [facility]

## 2015-08-15 ENCOUNTER — Other Ambulatory Visit: Payer: Self-pay | Admitting: Family Medicine

## 2015-08-15 DIAGNOSIS — R5383 Other fatigue: Secondary | ICD-10-CM

## 2015-08-15 DIAGNOSIS — R300 Dysuria: Secondary | ICD-10-CM | POA: Diagnosis not present

## 2015-08-15 DIAGNOSIS — I10 Essential (primary) hypertension: Secondary | ICD-10-CM

## 2015-08-15 DIAGNOSIS — R531 Weakness: Secondary | ICD-10-CM

## 2015-08-15 DIAGNOSIS — N390 Urinary tract infection, site not specified: Secondary | ICD-10-CM | POA: Diagnosis not present

## 2015-08-15 MED FILL — CEPHALEXIN 500 MG CAPSULE: 500 | 5 days supply | Qty: 10 | Fill #0

## 2015-08-15 MED FILL — CLOPIDOGREL 75 MG TABLET: 75 | 90 days supply | Qty: 90 | Fill #1

## 2015-08-16 NOTE — Telephone Encounter (Signed)
Rx request faxed to pharmacy/[facility]  

## 2015-08-20 MED FILL — CEPHALEXIN 500 MG CAPSULE: 500 | 2 days supply | Qty: 4 | Fill #0

## 2015-08-21 DIAGNOSIS — D225 Melanocytic nevi of trunk: Secondary | ICD-10-CM | POA: Diagnosis not present

## 2015-08-21 DIAGNOSIS — L814 Other melanin hyperpigmentation: Secondary | ICD-10-CM | POA: Diagnosis not present

## 2015-08-21 DIAGNOSIS — Z85828 Personal history of other malignant neoplasm of skin: Secondary | ICD-10-CM | POA: Diagnosis not present

## 2015-08-21 DIAGNOSIS — C44519 Basal cell carcinoma of skin of other part of trunk: Secondary | ICD-10-CM | POA: Diagnosis not present

## 2015-08-21 DIAGNOSIS — L821 Other seborrheic keratosis: Secondary | ICD-10-CM | POA: Diagnosis not present

## 2015-08-21 DIAGNOSIS — D2239 Melanocytic nevi of other parts of face: Secondary | ICD-10-CM | POA: Diagnosis not present

## 2015-08-21 DIAGNOSIS — D1801 Hemangioma of skin and subcutaneous tissue: Secondary | ICD-10-CM | POA: Diagnosis not present

## 2015-08-21 DIAGNOSIS — D692 Other nonthrombocytopenic purpura: Secondary | ICD-10-CM | POA: Diagnosis not present

## 2015-08-21 DIAGNOSIS — L570 Actinic keratosis: Secondary | ICD-10-CM | POA: Diagnosis not present

## 2015-08-23 ENCOUNTER — Telehealth: Payer: Self-pay | Admitting: Family Medicine

## 2015-08-23 NOTE — Telephone Encounter (Signed)
Last Tramadol was sent to [facility] long on 08/15/2015  #90 (to be put on hold).  Pharmacy needs permission re fill early as this patient is going out of town today. Advise

## 2015-08-23 NOTE — Telephone Encounter (Signed)
Ok to refill early  

## 2015-08-23 NOTE — Telephone Encounter (Signed)
[facility] Fl[facility] - [facility] - [phone]   Called in to receive the okay to refill pt's medication sooner.    Please assist further.

## 2015-08-23 NOTE — Telephone Encounter (Signed)
Pharmacy informed ok to refill early per PCP instructions.

## 2015-08-26 MED FILL — traMADol HCL 50 MG TABS: 50 | 30 days supply | Qty: 90 | Fill #0

## 2015-09-12 MED FILL — TRIAMCINOLONE 0.1% CREAM: 0.1 | 20 days supply | Qty: 80 | Fill #0

## 2015-09-19 ENCOUNTER — Encounter: Payer: Self-pay | Admitting: Family Medicine

## 2015-09-19 NOTE — Telephone Encounter (Signed)
error:[id number] ° °

## 2015-09-20 DIAGNOSIS — H52203 Unspecified astigmatism, bilateral: Secondary | ICD-10-CM | POA: Diagnosis not present

## 2015-09-20 DIAGNOSIS — H5310 Unspecified subjective visual disturbances: Secondary | ICD-10-CM | POA: Diagnosis not present

## 2015-09-20 DIAGNOSIS — Z961 Presence of intraocular lens: Secondary | ICD-10-CM | POA: Diagnosis not present

## 2015-09-23 ENCOUNTER — Other Ambulatory Visit: Payer: Self-pay | Admitting: Family Medicine

## 2015-09-23 DIAGNOSIS — R5383 Other fatigue: Secondary | ICD-10-CM

## 2015-09-23 DIAGNOSIS — R531 Weakness: Secondary | ICD-10-CM

## 2015-09-23 DIAGNOSIS — I10 Essential (primary) hypertension: Secondary | ICD-10-CM

## 2015-09-23 NOTE — Telephone Encounter (Signed)
Last Tramadol refill on 08/15/2015  #90 with 0 refills Last office visit 06/04/2015 Contract signed on 06/04/2015. No UDS

## 2015-09-24 NOTE — Telephone Encounter (Signed)
Faxed hardcopy for Tramadol to [facility].

## 2015-09-25 ENCOUNTER — Telehealth: Payer: Self-pay | Admitting: Family Medicine

## 2015-09-25 DIAGNOSIS — L820 Inflamed seborrheic keratosis: Secondary | ICD-10-CM | POA: Diagnosis not present

## 2015-09-25 DIAGNOSIS — Z85828 Personal history of other malignant neoplasm of skin: Secondary | ICD-10-CM | POA: Diagnosis not present

## 2015-09-25 NOTE — Telephone Encounter (Signed)
°  Relationship to patient: Self Can be reached: [phone]  Pharmacy:  [place], [place] - [place] [phone] (Phone) [phone] (Fax)     Reason for call: Patient is requesting  Rx for Tramadol be re-faxed to [facility]. They did not receive it

## 2015-09-25 NOTE — Telephone Encounter (Signed)
Pt called in to follow up on refill request. Informed him of the below. He would like to have someone to look into the receipt of the refill. Pharmacy states that they havent received.     [phone]

## 2015-09-26 MED FILL — traMADol HCL 50 MG TABS: 50 | 30 days supply | Qty: 90 | Fill #0

## 2015-09-26 NOTE — Telephone Encounter (Signed)
refaxed to pharmacy.

## 2015-09-26 NOTE — Telephone Encounter (Signed)
refaxed prescription and received confirmation Called the patient to to inform and left a message.

## 2015-10-01 ENCOUNTER — Telehealth: Payer: Self-pay | Admitting: Family Medicine

## 2015-10-01 NOTE — Telephone Encounter (Signed)
Pt came in to drop off a handicap form to have filled out by PCP. Placed form in basket for provider.   Pt is coming in on Tuesday  10/08/15, he would like to pick up form then if possible.

## 2015-10-02 ENCOUNTER — Telehealth: Payer: Self-pay | Admitting: Family Medicine

## 2015-10-02 NOTE — Telephone Encounter (Signed)
Pt's spouse called in. She says that the last few days pt has been waking up with a headache on the right side of his head. She would like a referral to [facility]. With Dr. Jannifer Franklin. She says that he use to see provider before. She called there office to schedule but was told that PCP would need to put in a referral due to timeframe of last referral.

## 2015-10-03 ENCOUNTER — Other Ambulatory Visit: Payer: Self-pay | Admitting: Family Medicine

## 2015-10-03 DIAGNOSIS — R519 Headache, unspecified: Secondary | ICD-10-CM

## 2015-10-03 DIAGNOSIS — R51 Headache: Principal | ICD-10-CM

## 2015-10-03 NOTE — Telephone Encounter (Signed)
Patient's wife informed

## 2015-10-03 NOTE — Telephone Encounter (Signed)
I have placed order but if it is persistent and severe he should not wait for that appt he should be seen sooner

## 2015-10-08 ENCOUNTER — Ambulatory Visit (INDEPENDENT_AMBULATORY_CARE_PROVIDER_SITE_OTHER): Payer: PPO

## 2015-10-08 DIAGNOSIS — Z23 Encounter for immunization: Secondary | ICD-10-CM | POA: Diagnosis not present

## 2015-10-11 ENCOUNTER — Other Ambulatory Visit: Payer: Self-pay | Admitting: Internal Medicine

## 2015-10-15 MED FILL — ATORVASTATIN 40 MG TABLET: 40 | 90 days supply | Qty: 90 | Fill #0

## 2015-10-15 MED FILL — METHYLDOPA 250 MG TABLET: 250 | 90 days supply | Qty: 900 | Fill #2

## 2015-10-16 ENCOUNTER — Ambulatory Visit: Payer: Self-pay | Admitting: Neurology

## 2015-10-16 MED FILL — AMLODIPINE BESYLATE 5 MG TA: 5 | 90 days supply | Qty: 180 | Fill #0

## 2015-10-23 ENCOUNTER — Other Ambulatory Visit: Payer: Self-pay | Admitting: Family Medicine

## 2015-10-23 DIAGNOSIS — R5383 Other fatigue: Secondary | ICD-10-CM

## 2015-10-23 DIAGNOSIS — R531 Weakness: Secondary | ICD-10-CM

## 2015-10-23 DIAGNOSIS — I10 Essential (primary) hypertension: Secondary | ICD-10-CM

## 2015-10-24 MED FILL — GABAPENTIN 300 MG CAPSULE: 300 | 90 days supply | Qty: 270 | Fill #1

## 2015-10-24 MED FILL — traMADol HCL 50 MG TABS: 50 | 30 days supply | Qty: 90 | Fill #0

## 2015-10-24 NOTE — Telephone Encounter (Signed)
Faxed hardcopy for Tramadol to [facility]

## 2015-10-28 ENCOUNTER — Telehealth: Payer: Self-pay | Admitting: Family Medicine

## 2015-10-28 DIAGNOSIS — R310 Gross hematuria: Secondary | ICD-10-CM | POA: Diagnosis not present

## 2015-10-28 MED FILL — CLOPIDOGREL 75 MG TABLET: 75 | 90 days supply | Qty: 90 | Fill #2

## 2015-10-28 NOTE — Telephone Encounter (Signed)
The patient was seen today 10/28/15 by urologist. He informed that due to his age (86) he no longer needed a PSA.  The wife and patient would like PCP's advise.  They are aware PCP will not be in the office until 11/01/2015 and will just wait her opinion Cell phone call back number is [phone].

## 2015-10-30 MED FILL — SULFAMETHOXAZOLE-TMP DS TAB: 800-160 | 7 days supply | Qty: 14 | Fill #0

## 2015-10-30 NOTE — Telephone Encounter (Signed)
I do agree the PSA is a test that really looks for early disease that is maybe going to have symptoms many years into the future. By the time men hit the 81s the chances are very hi that if they get prostate cancer it will not be an aggressive case and a patient will likely pass of something else. If sever symptoms occur we can always test for symptomatic reasons not just for screening

## 2015-11-01 DIAGNOSIS — R300 Dysuria: Secondary | ICD-10-CM | POA: Diagnosis not present

## 2015-11-04 NOTE — Telephone Encounter (Signed)
Patient informed of PCP instructions. He then stated urologist did end up however checking PSA.

## 2015-11-15 ENCOUNTER — Telehealth: Payer: Self-pay | Admitting: Family Medicine

## 2015-11-15 DIAGNOSIS — R531 Weakness: Secondary | ICD-10-CM

## 2015-11-15 DIAGNOSIS — R5383 Other fatigue: Secondary | ICD-10-CM

## 2015-11-15 DIAGNOSIS — I10 Essential (primary) hypertension: Secondary | ICD-10-CM

## 2015-11-15 NOTE — Telephone Encounter (Signed)
Caller name: [name] Relationship to patient: Wife Can be reached: [phone]  Pharmacy:  [place], [place] [phone] (Phone) [phone] (Fax)     Reason for call: Request refill on traMADol (ULTRAM) 50 MG tablet [[id number]

## 2015-11-15 NOTE — Telephone Encounter (Signed)
Pharmacy informed too soon for refill Last refill was on 10/23/2015 #90 with 0 refills. Last office visit 06/04/2015 Called the patient informed is wife refill too soon and patient informed as well.

## 2015-11-22 ENCOUNTER — Other Ambulatory Visit: Payer: Self-pay | Admitting: Family Medicine

## 2015-11-22 DIAGNOSIS — I10 Essential (primary) hypertension: Secondary | ICD-10-CM

## 2015-11-22 DIAGNOSIS — R5383 Other fatigue: Secondary | ICD-10-CM

## 2015-11-22 DIAGNOSIS — R531 Weakness: Secondary | ICD-10-CM

## 2015-11-22 MED FILL — traMADol HCL 50 MG TABS: 50 | 30 days supply | Qty: 90 | Fill #0

## 2015-11-22 NOTE — Telephone Encounter (Signed)
Faxed hardcopy for Tramadol to [facility]

## 2015-11-26 ENCOUNTER — Encounter: Payer: Self-pay | Admitting: Neurology

## 2015-11-26 ENCOUNTER — Ambulatory Visit (INDEPENDENT_AMBULATORY_CARE_PROVIDER_SITE_OTHER): Payer: PPO | Admitting: Neurology

## 2015-11-26 VITALS — BP 158/64 | HR 57 | Ht 71.0 in | Wt 170.2 lb

## 2015-11-26 DIAGNOSIS — R51 Headache: Secondary | ICD-10-CM | POA: Diagnosis not present

## 2015-11-26 DIAGNOSIS — M4712 Other spondylosis with myelopathy, cervical region: Secondary | ICD-10-CM | POA: Diagnosis not present

## 2015-11-26 DIAGNOSIS — G4486 Cervicogenic headache: Secondary | ICD-10-CM | POA: Insufficient documentation

## 2015-11-26 HISTORY — DX: Other spondylosis with myelopathy, cervical region: M47.12

## 2015-11-26 NOTE — Patient Instructions (Signed)
   With the gabapentin 300 mg, go to one in the morning and 2 in the evening.

## 2015-11-26 NOTE — Progress Notes (Signed)
Reason for visit: Headache  Referring physician: Dr. Delice Bison [name] is a 82 y.o. male  History of present illness:  [name] is an 82 year old right-handed white male with a history of cerebrovascular disease with a right thalamic stroke, left hemiparesis, prior right carotid endarterectomy. The patient also has a history of cervical spine disease, he has had a C5-6 myelopathy previously. 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 He has had some problem with headaches over the last one year. The patient reports significant problems with stiffness in the neck, discomfort in the neck, right greater than left. The patient will have pain going up from the neck into the right occipital area. The neck pain and the headache will parallel one another. Occasionally, the right occipital pain will be sharp and shooting in nature. The patient denies any pain down the arms on either side. The patient denies issues controlling the bowels or the bladder. He may have some slight dizziness at times, but no falls. He does have some chronic low back pain as well. The patient sleeps quite a bit throughout the day. The patient is on gabapentin taking 300 mg twice a day. He is sent to this office for an evaluation.  Past Medical History:  Diagnosis Date  . Acute bronchitis 06/10/2015  . Allergic rhinitis   . Anemia   . Asthma   . Carotid stenosis    a. s/p Left CEA 2002;  b. Carotid US 3/16:  patent R CEA, L < 40%  . Chronic pain syndrome    Left shoulder, back  . Diverticulosis   . Dyslipidemia   . Esophageal stricture    Distal, benign  . GERD (gastroesophageal reflux disease)   . Hemorrhoids   . HTN (hypertension)    Negative renal duplex 07-22-11  . Hx of echocardiogram    a. Echo 10/11: mod LVH, EF 55-60%  . Pneumonia 07-2013  . Prostate cancer ([place]) 2000   Seed XRT  . Stroke [facility]) 8/01   right thalamic - on chronic Plavix/ASA    Past  Surgical History:  Procedure Laterality Date  . APPENDECTOMY  1953  . CAROTID ENDARTERECTOMY Right 01/2000  . CATARACT EXTRACTION Bilateral   . CERVICAL DISC SURGERY  8/07  . INSERTION PROSTATE RADIATION SEED  2000  . [place] SURGERY  1990s  . NASAL SINUS SURGERY     x 3    Family History  Problem Relation Age of Onset  . Stroke Brother   . Stroke Sister   . Stroke Mother   . Hyperlipidemia Mother   . Hypertension Mother   . Lung disease Father   . Diabetes Son   . Gout Son   . Obesity Sister   . Alcohol abuse Brother   . Cancer Brother     lung, smoker    Social history:  reports that he quit smoking about 37 years ago. His smoking use included Cigarettes. He has a 8.75 pack-year smoking history. His smokeless tobacco use includes Chew. He reports that he does not drink alcohol or use drugs.  Medications:  Prior to Admission medications   Medication Sig Start Date End Date Taking? Authorizing Provider  albuterol (PROVENTIL HFA;VENTOLIN HFA) 108 (90 Base) MCG/ACT inhaler Inhale 1-2 puffs into the lungs every 6 (six) hours as needed for wheezing or shortness of breath. 03/02/15  Yes Melony Overly, MD  amLODipine (NORVASC) 5 MG tablet TAKE  1 TABLET BY MOUTH TWICE A DAY 10/14/15  Yes Fay Records, MD  aspirin 325 MG tablet Take 325 mg by mouth daily.   Yes Historical Provider, MD  atorvastatin (LIPITOR) 40 MG tablet Take 1 tablet (40 mg total) by mouth daily. 06/04/15  Yes [name], MD  azithromycin (ZITHROMAX) 250 MG tablet 2 tabs po once then 1 tab po daily x 4 days 06/14/15  Yes [name], MD  cetirizine (ZYRTEC) 10 MG tablet Take 10 mg by mouth daily.   Yes Historical Provider, MD  cholecalciferol (VITAMIN D) 1000 UNITS tablet Take 1,000 Units by mouth daily.   Yes Historical Provider, MD  clopidogrel (PLAVIX) 75 MG tablet Take 1 tablet (75 mg total) by mouth daily. 07/31/15  Yes Fay Records, MD  docusate sodium (COLACE) 100 MG capsule Take 100 mg by mouth 2 (two)  times daily.   Yes Historical Provider, MD  fluticasone (FLONASE) 50 MCG/ACT nasal spray PLACE 2 SPRAYS INTO BOTH NOSTRILS DAILY. 07/09/15  Yes Hoyt Koch, MD  gabapentin (NEURONTIN) 300 MG capsule Take 1 capsule (300 mg total) by mouth 3 (three) times daily. 06/04/15  Yes [name], MD  methyldopa (ALDOMET) 500 MG tablet Take 2 tablets by mouth in the morning, 1 tablet by mouth mid day and 2 tablets by mouth in the evening 04/01/15  Yes Fay Records, MD  montelukast (SINGULAIR) 10 MG tablet Take 1 tablet (10 mg total) by mouth at bedtime. 05/01/15  Yes Rigoberto Noel, MD  omeprazole (PRILOSEC) 40 MG capsule Take 1 capsule (40 mg total) by mouth daily. 12/19/14  Yes Rowe Clack, MD  phenazopyridine (PYRIDIUM) 100 MG tablet Take 1 tablet (100 mg total) by mouth 3 (three) times daily as needed for pain. 02/09/15  Yes Billy Fischer, MD  polyethylene glycol (MIRALAX / GLYCOLAX) packet Take 17 g by mouth as needed for mild constipation.    Yes Historical Provider, MD  senna-docusate (SENOKOT-S) 8.6-50 MG per tablet Take 1 tablet by mouth daily as needed for mild constipation.    Yes Historical Provider, MD  traMADol (ULTRAM) 50 MG tablet TAKE 1 TABLET BY MOUTH EVERY 8 HOURS FOR PAIN 11/22/15  Yes [name], MD  triamcinolone cream (KENALOG) 0.5 % Apply 1 application topically as needed (for skin irritation).  01/22/12  Yes Janith Lima, MD  Wheat Dextrin (BENEFIBER PO) Take 1 scoop by mouth 2 (two) times daily. Powder- Mix with liquid   Yes Historical Provider, MD      Allergies  Allergen Reactions  . Diovan [Valsartan] Other (See Comments)    Elevated potasium hyperkalemia  . Hctz [Hydrochlorothiazide] Other (See Comments)    Swelling and dyspnea   . Oxycodone-Acetaminophen Nausea And Vomiting  . Codeine Nausea Only  . Oxycodone-Acetaminophen Nausea Only    ROS:  Out of a complete 14 system review of symptoms, the patient complains only of the following symptoms, and  all other reviewed systems are negative.  Ringing in the ears Wheezing Urination problems, incontinence of the bladder, blood in urine, impotence Easy bruising Joint pain Confusion, headache, dizziness Too much sleep, disinterest in activities Sleepiness  Blood pressure (!) 158/64, pulse (!) 57, height '5\' 11"'$  (1.803 m), weight 170 lb 4 oz (77.2 kg).  Physical Exam  General: The patient is alert and cooperative at the time of the examination.  Eyes: Pupils are equal, round, and reactive to light. Discs are flat bilaterally.  Neck: The  neck is supple, no carotid bruits are noted.  Respiratory: The respiratory examination is clear.  Cardiovascular: The cardiovascular examination reveals a regular rate and rhythm, no obvious murmurs or rubs are noted.  Neuromuscular: The patient has severe limitation of rotational and flexion movement of the neck.  Skin: Extremities are without significant edema.  Neurologic Exam  Mental status: The patient is alert and oriented x 3 at the time of the examination. The patient has apparent normal recent and remote memory, with an apparently normal attention span and concentration ability.  Cranial nerves: Facial symmetry is present. There is good sensation of the face to pinprick and soft touch bilaterally. The strength of the facial muscles and the muscles to head turning and shoulder shrug are normal bilaterally. Speech is well enunciated, no aphasia or dysarthria is noted. Extraocular movements are full. Visual fields are full. The tongue is midline, and the patient has symmetric elevation of the soft palate. No obvious hearing deficits are noted.  Motor: The motor testing reveals 5 over 5 strength of all 4 extremities. Good symmetric motor tone is slightly elevated in the left leg.  Sensory: Sensory testing is intact to pinprick, soft touch, vibration sensation, and position sense on all 4 extremities, with exception of some decreased pinprick  sensation on the left arm. No evidence of extinction is noted.  Coordination: Cerebellar testing reveals good finger-nose-finger and heel-to-shin on the right, the patient has dysmetria with finger-nose-finger on the left arm, some difficulty with heel-to-shin on the left.  Gait and station: Gait is associated with a circumduction type gait with the left leg. Tandem gait is unsteady. Romberg is negative. No drift is seen.  Reflexes: Deep tendon reflexes are symmetric in the legs, slightly brisk at the knees. The left biceps reflex is elevated relative to the right. Toes are downgoing bilaterally.   Assessment/Plan:  1. Cervical spondylosis with cervical myelopathy at the C5-6 level  2. Cervicogenic headache  3. Cerebrovascular disease, left hemiparesis  4. Gait disorder  The patient has significant neck discomfort, decreased mobility in the neck, with associated right occipital headaches associated with the neck pain. The patient will go up on the gabapentin taking 300 mg in the morning and 600 mg in the evening. He will be sent for neuromuscular therapy. If the discomfort does not improve, we will consider MRI of the cervical spine, we may consider steroid epidural injections. The patient will follow-up in 3-4 months.  Jill Alexanders MD 11/26/2015 3:40 PM  [facility] [address] [place], [place] [address]  Phone [phone] Fax [phone]

## 2015-12-02 ENCOUNTER — Telehealth: Payer: Self-pay | Admitting: Family Medicine

## 2015-12-02 NOTE — Telephone Encounter (Signed)
Caller name: [name] Relationship to patient: wife Can be reached: [phone]  Reason for call: Pt states she got a bill for $30 from [facility] from [place] 10/08/15 when she received her flu shot. They have never paid anything so she called her insurance rep and was told the admin fee and vaccination should have been covered at 100%. Pt would like a call back.

## 2015-12-16 ENCOUNTER — Other Ambulatory Visit: Payer: Self-pay | Admitting: Family Medicine

## 2015-12-16 DIAGNOSIS — I10 Essential (primary) hypertension: Secondary | ICD-10-CM

## 2015-12-16 DIAGNOSIS — E782 Mixed hyperlipidemia: Secondary | ICD-10-CM

## 2015-12-16 DIAGNOSIS — I6350 Cerebral infarction due to unspecified occlusion or stenosis of unspecified cerebral artery: Secondary | ICD-10-CM

## 2015-12-16 DIAGNOSIS — E559 Vitamin D deficiency, unspecified: Secondary | ICD-10-CM

## 2015-12-18 ENCOUNTER — Other Ambulatory Visit: Payer: Self-pay | Admitting: Family Medicine

## 2015-12-18 DIAGNOSIS — R531 Weakness: Secondary | ICD-10-CM

## 2015-12-18 DIAGNOSIS — R5383 Other fatigue: Secondary | ICD-10-CM

## 2015-12-18 DIAGNOSIS — I10 Essential (primary) hypertension: Secondary | ICD-10-CM

## 2015-12-19 MED FILL — traMADol HCL 50 MG TABS: 50 | 30 days supply | Qty: 90 | Fill #0

## 2015-12-19 NOTE — Telephone Encounter (Signed)
Faxed hardcopy for Tramadol to [facility]

## 2015-12-30 DIAGNOSIS — N3040 Irradiation cystitis without hematuria: Secondary | ICD-10-CM | POA: Diagnosis not present

## 2015-12-30 DIAGNOSIS — R300 Dysuria: Secondary | ICD-10-CM | POA: Diagnosis not present

## 2015-12-31 MED FILL — GABAPENTIN 300 MG CAPSULE: 300 | 90 days supply | Qty: 270 | Fill #0

## 2016-01-02 MED FILL — CIPROFLOXACIN HCL 250 MG TA: 250 | 7 days supply | Qty: 14 | Fill #0

## 2016-01-15 MED FILL — CIPROFLOXACIN HCL 500 MG TA: 500 | 7 days supply | Qty: 14 | Fill #0

## 2016-01-16 ENCOUNTER — Other Ambulatory Visit: Payer: Self-pay | Admitting: Family Medicine

## 2016-01-16 DIAGNOSIS — I10 Essential (primary) hypertension: Secondary | ICD-10-CM

## 2016-01-16 DIAGNOSIS — R5383 Other fatigue: Secondary | ICD-10-CM

## 2016-01-16 DIAGNOSIS — R531 Weakness: Secondary | ICD-10-CM

## 2016-01-16 MED FILL — ATORVASTATIN 40 MG TABLET: 40 | 90 days supply | Qty: 90 | Fill #1

## 2016-01-16 MED FILL — METHYLDOPA 250 MG TABLET: 250 | 90 days supply | Qty: 900 | Fill #3

## 2016-01-16 MED FILL — traMADol HCL 50 MG TABS: 50 | 30 days supply | Qty: 90 | Fill #0

## 2016-01-16 NOTE — Telephone Encounter (Signed)
Faxed hardcopy for Tramadol to [facility]

## 2016-01-16 NOTE — Telephone Encounter (Signed)
Requesting:   Tramadol Contract    06/04/2015 UDS     None Last OV     06/04/2015 Last Refill    #90 with 0 refills on 12/18/2015  Please Advise

## 2016-01-24 MED FILL — CLOPIDOGREL 75 MG TABLET: 75 | 90 days supply | Qty: 90 | Fill #3

## 2016-01-24 MED FILL — AMLODIPINE BESYLATE 5 MG TA: 5 | 90 days supply | Qty: 180 | Fill #1

## 2016-02-17 ENCOUNTER — Other Ambulatory Visit: Payer: Self-pay | Admitting: Family Medicine

## 2016-02-17 DIAGNOSIS — I10 Essential (primary) hypertension: Secondary | ICD-10-CM

## 2016-02-17 DIAGNOSIS — R5383 Other fatigue: Secondary | ICD-10-CM

## 2016-02-17 DIAGNOSIS — R531 Weakness: Secondary | ICD-10-CM

## 2016-02-18 MED FILL — traMADol HCL 50 MG TABS: 50 | 30 days supply | Qty: 90 | Fill #0

## 2016-02-18 NOTE — Telephone Encounter (Signed)
Faxed hardcopy for Tramadol to [facility]

## 2016-02-20 ENCOUNTER — Telehealth: Payer: Self-pay | Admitting: Family Medicine

## 2016-02-20 MED ORDER — DOXYCYCLINE HYCLATE 100 MG PO TABS: 100.0000 mg | ORAL_TABLET | Freq: Two times a day (BID) | ORAL | 0 refills | Status: DC

## 2016-02-20 NOTE — Telephone Encounter (Signed)
Please advise    PC 

## 2016-02-20 NOTE — Telephone Encounter (Signed)
Cannot tell them definitively it is a bacteria but am willing to try Doxycycline 100 mg po bid x 7 days. If no improvement or worsens come in for evaluation. Take plain mucinex twice daily and a probiotic daily

## 2016-02-20 NOTE — Telephone Encounter (Signed)
Caller name: [name] Relationship to patient: Wife Can be reached: [phone] Pharmacy:  [place], [place] - [place] [phone] (Phone) [phone] (Fax)     Reason for call: Wife is request that a Rx for Doxycycline for his congestion in his chest. States he had a fever but it has gone down.

## 2016-02-20 NOTE — Telephone Encounter (Signed)
Wife notified.

## 2016-02-21 MED FILL — DOXYCYCLINE HYCLATE 100 MG: 100 | 7 days supply | Qty: 14 | Fill #0

## 2016-02-24 ENCOUNTER — Telehealth: Payer: Self-pay

## 2016-02-24 ENCOUNTER — Encounter: Payer: Self-pay | Admitting: Gastroenterology

## 2016-02-24 ENCOUNTER — Ambulatory Visit (INDEPENDENT_AMBULATORY_CARE_PROVIDER_SITE_OTHER): Payer: PPO | Admitting: Gastroenterology

## 2016-02-24 VITALS — BP 100/50 | HR 54 | Ht 71.0 in | Wt 169.0 lb

## 2016-02-24 DIAGNOSIS — R1013 Epigastric pain: Secondary | ICD-10-CM | POA: Diagnosis not present

## 2016-02-24 DIAGNOSIS — R110 Nausea: Secondary | ICD-10-CM

## 2016-02-24 DIAGNOSIS — G8929 Other chronic pain: Secondary | ICD-10-CM

## 2016-02-24 DIAGNOSIS — R630 Anorexia: Secondary | ICD-10-CM | POA: Diagnosis not present

## 2016-02-24 MED ORDER — ONDANSETRON HCL 4 MG PO TABS: 4.0000 mg | ORAL_TABLET | Freq: Three times a day (TID) | ORAL | 1 refills | Status: DC | PRN

## 2016-02-24 MED FILL — ONDANSETRON HCL 4 MG TABLET: 4 | 10 days supply | Qty: 30 | Fill #0

## 2016-02-24 NOTE — Patient Instructions (Signed)
If you are age 81 or older, your body mass index should be between 23-30. Your Body mass index is 23.57 kg/m. If this is out of the aforementioned range listed, please consider follow up with your Primary Care Provider.  If you are age 63 or younger, your body mass index should be between 19-25. Your Body mass index is 23.57 kg/m. If this is out of the aformentioned range listed, please consider follow up with your Primary Care Provider.   You have been scheduled for an endoscopy. Please follow written instructions given to you at your visit today. If you use inhalers (even only as needed), please bring them with you on the day of your procedure. Your physician has requested that you go to www.startemmi.com and enter the access code given to you at your visit today. This web site gives a general overview about your procedure. However, you should still follow specific instructions given to you by our office regarding your preparation for the procedure.  You will be contacted by our office prior to your procedure for directions on holding your Plavix.  If you do not hear from our office 1 week prior to your scheduled procedure, please call [phone] to discuss.    Thank you for choosing me and [facility]. Dr. Madelon Lips

## 2016-02-24 NOTE — Progress Notes (Signed)
[facility] Progress Note  Chief Complaint: Loss of appetite and nausea  Subjective  History:  This is an 83 year old man last seen by Dr. Olevia Perches in April 2015, at which time he was evaluated for chronic abdominal pain and constipation. He was found to have a fecal impaction treated with a bowel regimen, and he reports doing better since then. He has one to 3 BMs per day with the use of stool softeners, and he uncommonly uses MiraLAX anymore. He was seen in 2013 for abdominal pain and constipation as well. He is here with his wife today, and they report that for about the last year he has had decreased appetite and intermittent nausea. His wife says that off and he just does not seem to have much interest in food, or he may eat just very little. There is sometimes nausea but without vomiting. He seems to deny dysphagia, and may have lost a few pounds over the last year. His daughter recently gave him one of her Zofran tablets with significant improvement in the nausea. Some mornings he might awaken already having a sick feeling in the stomach other days it may occur after he takes a large number of his morning medicines.  ROS: Cardiovascular:  no chest pain Respiratory: no dyspnea  The patient's Past Medical, Family and Social History were reviewed and are on file in the EMR.  Objective:  Med list reviewed  Vital signs in last 24 hrs: Vitals:   02/24/16 1106  BP: (!) 100/50  Pulse: (!) 54    Physical Exam    HEENT: sclera anicteric, oral mucosa moist without lesions  Neck: supple, no thyromegaly, JVD or lymphadenopathy  Cardiac: RRR without murmurs, S1S2 heard, no peripheral edema  Pulm: clear to auscultation bilaterally, normal RR and effort noted  Abdomen: soft, Mild epigastric tenderness, with active bowel sounds. No guarding or palpable hepatosplenomegaly.  Skin; warm and dry, no jaundice or rash   No recent data  '@ASSESSMENTPLANBEGIN'$ @ Assessment: Encounter  Diagnoses  Name Primary?  . Abdominal pain, chronic, epigastric Yes  . Nausea without vomiting   . Loss of appetite    Symptoms are somewhat difficult to place, especially in an elderly man on multiple medicines.   Plan:  EGD. He is agreeable  The benefits and risks of the planned procedure were described in detail with the patient or (when appropriate) their health care proxy.  Risks were outlined as including, but not limited to, bleeding, infection, perforation, adverse medication reaction leading to cardiac or pulmonary decompensation, or pancreatitis (if ERCP).  The limitation of incomplete mucosal visualization was also discussed.  No guarantees or warranties were given.   Total time 30 minutes, over half spent in counseling and coordination of care. They had multiple questions related to the procedure and its risks, all of which were answered.  Nelida Meuse III

## 2016-02-24 NOTE — Telephone Encounter (Signed)
  02/24/2016   RE: [name] DOB: February 02, 1929 MRN: [id number]   Dear Dr. Harrington Challenger,    We have scheduled the above patient for an endoscopic procedure. Our records show that he is on anticoagulation therapy.   Please advise as to how long the patient may come off his therapy of Plavix prior to the procedure, which is scheduled for 04/02/16.  Please fax back to [facility] at [phone].   Sincerely,    Thurmon Fair, RMA

## 2016-02-25 NOTE — Telephone Encounter (Signed)
Pt is followed by T Early for CV dz. History of CVA (thalamic) in past    He has been on ASA and Plavix since I will forward to him as well

## 2016-02-26 ENCOUNTER — Ambulatory Visit: Payer: PPO | Admitting: Gastroenterology

## 2016-02-27 ENCOUNTER — Telehealth: Payer: Self-pay | Admitting: Family Medicine

## 2016-02-27 ENCOUNTER — Telehealth: Payer: Self-pay | Admitting: Gastroenterology

## 2016-02-27 NOTE — Telephone Encounter (Signed)
-----   Message from Oneta Rack sent at 02/26/2016  4:52 PM EST ----- Regarding: Santina Evans Contact: [phone] Caller name: Nikko Goldwire (Spouse)  Call back number: [phone]   Reason for call:  As per wife patient would like patient to be referred to another Doctor within [facility] due to patient not being happy with Doran Stabler, MD as per wife, please advise

## 2016-02-27 NOTE — Telephone Encounter (Signed)
Anderson Malta,  Patient's wife called the office requesting that the patient switch from Dr. Loletha Carrow because he is not happy.  I told her we could send a request to one of the providers to see if they would accept the switch but I would need to know which MD he would like to switch too.  She said she is waiting on Dr. Charlett Blake to recommend someone in our office.  Thanks [employer]

## 2016-02-27 NOTE — Telephone Encounter (Signed)
-----   Message from Synthia Innocent sent at 02/27/2016  8:07 AM EST ----- Regarding: FW: [facility] Contact: [phone] Is it possible for patient to change drs? Thanks ----- Message ----- From: Oneta Rack Sent: 02/26/2016   4:52 PM To: [place] Ewing, CMA Subject: Santina Evans                                 Caller name: Jhonatan Lomeli (Spouse)  Call back number: [phone]   Reason for call:  As per wife patient would like patient to be referred to another Doctor within [facility] due to patient not being happy with Doran Stabler, MD as per wife, please advise

## 2016-02-27 NOTE — Telephone Encounter (Signed)
Advise please on this referral

## 2016-02-27 NOTE — Telephone Encounter (Signed)
I have sent a message to [facility], wanting to see if its possible for him to transfer drs

## 2016-02-27 NOTE — Telephone Encounter (Signed)
Not sure what their current policy is regarding internal transfers, please check and see if they allow transfer of providers. If they do not I can do an external transfer

## 2016-02-27 NOTE — Telephone Encounter (Signed)
I spoke to patients wife and she states that she will find out what Dr. Patient wants to transfer to and will callback.

## 2016-02-28 NOTE — Telephone Encounter (Signed)
If no response by early this week see if they want to switch to a different practice or wait for a response

## 2016-02-28 NOTE — Telephone Encounter (Signed)
Patients wife called to follow-up on referral.  The wife is very concerned and wants her husband to be referred to a more experience GI MD.  She states the patient is just staying nauseated off and on , taking zofran .  Please advise if this is going to be possible to transfer internally.  Patient is not eating well.

## 2016-03-02 NOTE — Telephone Encounter (Signed)
Patient is scheduled to see Dr Loletha Carrow on 04/02/16

## 2016-03-03 NOTE — Telephone Encounter (Signed)
I might recommend Dr Fuller Plan or Dr Hilarie Fredrickson for him if he is allowed to switch internally. What is the latest

## 2016-03-03 NOTE — Telephone Encounter (Signed)
Canceled patient's Endo. Patient said he will CB to let us know which provider he wants to switch too

## 2016-03-03 NOTE — Telephone Encounter (Signed)
I respect their choice. Please cancel his EGD with me on 3/22 and let the patient know that we have done so.

## 2016-03-03 NOTE — Telephone Encounter (Addendum)
Caller Name:  Gwinner,Joanne Relation to PB:DHDI Call back number:[phone]   Reason for call:  Spouse would like Dr. Charlett Blake to recommend another GI within [facility], please advise

## 2016-03-04 NOTE — Telephone Encounter (Signed)
Patient wife states she would like patient to switch care to Dr.Gessner due to her being a patient of his. Do you accept patient?

## 2016-03-05 NOTE — Telephone Encounter (Signed)
I am happy to see him  Have reviewed Dr. Loletha Carrow' notes and would just set up an EGD with me for same reasons unless patient wants to see me first then set up an office visit  I am ok to do his procedure on Plavix - no need to hold it  Am ccing PJ

## 2016-03-05 NOTE — Telephone Encounter (Signed)
Left detail message, ok per DPR with GI Drs, Dr Charlett Blake recommended.

## 2016-03-06 NOTE — Telephone Encounter (Signed)
LM on Vmail to CB to schedule an Endo with Dr. Carlean Purl

## 2016-03-06 NOTE — Telephone Encounter (Signed)
Pt has transferred care to Dr Carlean Purl for continued care. Per Dr Carlean Purl okay to proceed with EGD on Plavix.

## 2016-03-13 NOTE — Telephone Encounter (Signed)
Mailing patient a letter to call us since we've been unable to reach him by phone.

## 2016-03-16 ENCOUNTER — Other Ambulatory Visit: Payer: Self-pay | Admitting: Family Medicine

## 2016-03-16 DIAGNOSIS — R5383 Other fatigue: Secondary | ICD-10-CM

## 2016-03-16 DIAGNOSIS — R531 Weakness: Secondary | ICD-10-CM

## 2016-03-16 DIAGNOSIS — I10 Essential (primary) hypertension: Secondary | ICD-10-CM

## 2016-03-16 NOTE — Telephone Encounter (Signed)
Received a Rx request for Tramadol '50mg'$  (take 1 tab po q8h prn for pain).  Last Rf: 02/18/2016 Last Ov: 06/04/2015 No follow up appointment is scheduled at this time. UDS: 06/04/15 controlled substance contract signed, no uds sample given.  Forwarded to Provider for review, approval or denial.

## 2016-03-16 NOTE — Telephone Encounter (Signed)
I cannot write any more of this med until he is seen. He has to be seen every 6 months to keep this prescription active. Can rx #10 tabs til seen to use sparingly.

## 2016-03-17 ENCOUNTER — Other Ambulatory Visit: Payer: Self-pay | Admitting: Family Medicine

## 2016-03-17 DIAGNOSIS — R531 Weakness: Secondary | ICD-10-CM

## 2016-03-17 DIAGNOSIS — R5383 Other fatigue: Secondary | ICD-10-CM

## 2016-03-17 DIAGNOSIS — I10 Essential (primary) hypertension: Secondary | ICD-10-CM

## 2016-03-17 MED ORDER — TRAMADOL HCL 50 MG PO TABS
50.0000 mg | ORAL_TABLET | Freq: Three times a day (TID) | ORAL | 0 refills | Status: DC | PRN
Start: 2016-03-17 — End: 2016-03-19

## 2016-03-17 MED FILL — traMADol HCL 50 MG TABS: 50 | 4 days supply | Qty: 10 | Fill #0

## 2016-03-17 NOTE — Telephone Encounter (Signed)
Printed tramadol #10 per pcp instructions Patient scheduled appt for 03/30/16

## 2016-03-17 NOTE — Telephone Encounter (Signed)
He can take 2 a day til seen but I am willing to let him have 40 to hold him til appt

## 2016-03-17 NOTE — Telephone Encounter (Signed)
Printed #10 as PCP instructed. He has now made an appt for 03/30/16 He states he takes 3 per day and will be out by then.

## 2016-03-18 NOTE — Telephone Encounter (Signed)
[name] called back and said patient only wants to schedule a OV with Dr. Carlean Purl.  Declines Endo

## 2016-03-19 ENCOUNTER — Other Ambulatory Visit: Payer: Self-pay | Admitting: Family Medicine

## 2016-03-19 DIAGNOSIS — I10 Essential (primary) hypertension: Secondary | ICD-10-CM

## 2016-03-19 DIAGNOSIS — R531 Weakness: Secondary | ICD-10-CM

## 2016-03-19 DIAGNOSIS — R5383 Other fatigue: Secondary | ICD-10-CM

## 2016-03-19 MED ORDER — TRAMADOL HCL 50 MG PO TABS
50.0000 mg | ORAL_TABLET | Freq: Three times a day (TID) | ORAL | 0 refills | Status: DC | PRN
Start: 2016-03-19 — End: 2016-03-30

## 2016-03-19 NOTE — Telephone Encounter (Signed)
Printed tramadol #40 per pcp instructions per telephone note. Faxed hardcopy to [facility]

## 2016-03-19 NOTE — Telephone Encounter (Signed)
Printed tramadol #40 and faxed to [facility]. Patient informed #40 sent in to [place]

## 2016-03-20 MED FILL — traMADol HCL 50 MG TABS: 50 | 16 days supply | Qty: 40 | Fill #0

## 2016-03-30 ENCOUNTER — Encounter: Payer: Self-pay | Admitting: Family Medicine

## 2016-03-30 ENCOUNTER — Ambulatory Visit (INDEPENDENT_AMBULATORY_CARE_PROVIDER_SITE_OTHER): Payer: PPO | Admitting: Family Medicine

## 2016-03-30 VITALS — BP 145/80 | HR 73 | Temp 97.8°F | Wt 169.2 lb

## 2016-03-30 DIAGNOSIS — M719 Bursopathy, unspecified: Secondary | ICD-10-CM

## 2016-03-30 DIAGNOSIS — M67919 Unspecified disorder of synovium and tendon, unspecified shoulder: Secondary | ICD-10-CM

## 2016-03-30 DIAGNOSIS — E782 Mixed hyperlipidemia: Secondary | ICD-10-CM | POA: Diagnosis not present

## 2016-03-30 DIAGNOSIS — R739 Hyperglycemia, unspecified: Secondary | ICD-10-CM

## 2016-03-30 DIAGNOSIS — R531 Weakness: Secondary | ICD-10-CM

## 2016-03-30 DIAGNOSIS — R5383 Other fatigue: Secondary | ICD-10-CM | POA: Diagnosis not present

## 2016-03-30 DIAGNOSIS — R1013 Epigastric pain: Secondary | ICD-10-CM | POA: Diagnosis not present

## 2016-03-30 DIAGNOSIS — E559 Vitamin D deficiency, unspecified: Secondary | ICD-10-CM

## 2016-03-30 DIAGNOSIS — I10 Essential (primary) hypertension: Secondary | ICD-10-CM

## 2016-03-30 MED ORDER — TRAMADOL HCL 50 MG PO TABS: 50.0000 mg | ORAL_TABLET | Freq: Three times a day (TID) | ORAL | 0 refills | Status: DC | PRN

## 2016-03-30 NOTE — Patient Instructions (Signed)
Lidocaine gel, roll on and patches Hypertension Hypertension, commonly called high blood pressure, is when the force of blood pumping through the arteries is too strong. The arteries are the blood vessels that carry blood from the heart throughout the body. Hypertension forces the heart to work harder to pump blood and may cause arteries to become narrow or stiff. Having untreated or uncontrolled hypertension can cause heart attacks, strokes, kidney disease, and other problems. A blood pressure reading consists of a higher number over a lower number. Ideally, your blood pressure should be below 120/80. The first ("top") number is called the systolic pressure. It is a measure of the pressure in your arteries as your heart beats. The second ("bottom") number is called the diastolic pressure. It is a measure of the pressure in your arteries as the heart relaxes. What are the causes? The cause of this condition is not known. What increases the risk? Some risk factors for high blood pressure are under your control. Others are not. Factors you can change   Smoking.  Having type 2 diabetes mellitus, high cholesterol, or both.  Not getting enough exercise or physical activity.  Being overweight.  Having too much fat, sugar, calories, or salt (sodium) in your diet.  Drinking too much alcohol. Factors that are difficult or impossible to change   Having chronic kidney disease.  Having a family history of high blood pressure.  Age. Risk increases with age.  Race. You may be at higher risk if you are African-American.  Gender. Men are at higher risk than women before age 82. After age 53, women are at higher risk than men.  Having obstructive sleep apnea.  Stress. What are the signs or symptoms? Extremely high blood pressure (hypertensive crisis) may cause:  Headache.  Anxiety.  Shortness of breath.  Nosebleed.  Nausea and vomiting.  Severe chest pain.  Jerky movements you  cannot control (seizures). How is this diagnosed? This condition is diagnosed by measuring your blood pressure while you are seated, with your arm resting on a surface. The cuff of the blood pressure monitor will be placed directly against the skin of your upper arm at the level of your heart. It should be measured at least twice using the same arm. Certain conditions can cause a difference in blood pressure between your right and left arms. Certain factors can cause blood pressure readings to be lower or higher than normal (elevated) for a short period of time:  When your blood pressure is higher when you are in a health care provider's office than when you are at home, this is called white coat hypertension. Most people with this condition do not need medicines.  When your blood pressure is higher at home than when you are in a health care provider's office, this is called masked hypertension. Most people with this condition may need medicines to control blood pressure. If you have a high blood pressure reading during one visit or you have normal blood pressure with other risk factors:  You may be asked to return on a different day to have your blood pressure checked again.  You may be asked to monitor your blood pressure at home for 1 week or longer. If you are diagnosed with hypertension, you may have other blood or imaging tests to help your health care provider understand your overall risk for other conditions. How is this treated? This condition is treated by making healthy lifestyle changes, such as eating healthy foods, exercising more, and  reducing your alcohol intake. Your health care provider may prescribe medicine if lifestyle changes are not enough to get your blood pressure under control, and if:  Your systolic blood pressure is above 130.  Your diastolic blood pressure is above 80. Your personal target blood pressure may vary depending on your medical conditions, your age, and  other factors. Follow these instructions at home: Eating and drinking   Eat a diet that is high in fiber and potassium, and low in sodium, added sugar, and fat. An example eating plan is called the DASH (Dietary Approaches to Stop Hypertension) diet. To eat this way:  Eat plenty of fresh fruits and vegetables. Try to fill half of your plate at each meal with fruits and vegetables.  Eat whole grains, such as whole wheat pasta, brown rice, or whole grain bread. Fill about one quarter of your plate with whole grains.  Eat or drink low-fat dairy products, such as skim milk or low-fat yogurt.  Avoid fatty cuts of meat, processed or cured meats, and poultry with skin. Fill about one quarter of your plate with lean proteins, such as fish, chicken without skin, beans, eggs, and tofu.  Avoid premade and processed foods. These tend to be higher in sodium, added sugar, and fat.  Reduce your daily sodium intake. Most people with hypertension should eat less than 1,500 mg of sodium a day.  Limit alcohol intake to no more than 1 drink a day for nonpregnant women and 2 drinks a day for men. One drink equals 12 oz of beer, 5 oz of wine, or 1 oz of hard liquor. Lifestyle   Work with your health care provider to maintain a healthy body weight or to lose weight. Ask what an ideal weight is for you.  Get at least 30 minutes of exercise that causes your heart to beat faster (aerobic exercise) most days of the week. Activities may include walking, swimming, or biking.  Include exercise to strengthen your muscles (resistance exercise), such as pilates or lifting weights, as part of your weekly exercise routine. Try to do these types of exercises for 30 minutes at least 3 days a week.  Do not use any products that contain nicotine or tobacco, such as cigarettes and e-cigarettes. If you need help quitting, ask your health care provider.  Monitor your blood pressure at home as told by your health care  provider.  Keep all follow-up visits as told by your health care provider. This is important. Medicines   Take over-the-counter and prescription medicines only as told by your health care provider. Follow directions carefully. Blood pressure medicines must be taken as prescribed.  Do not skip doses of blood pressure medicine. Doing this puts you at risk for problems and can make the medicine less effective.  Ask your health care provider about side effects or reactions to medicines that you should watch for. Contact a health care provider if:  You think you are having a reaction to a medicine you are taking.  You have headaches that keep coming back (recurring).  You feel dizzy.  You have swelling in your ankles.  You have trouble with your vision. Get help right away if:  You develop a severe headache or confusion.  You have unusual weakness or numbness.  You feel faint.  You have severe pain in your chest or abdomen.  You vomit repeatedly.  You have trouble breathing. Summary  Hypertension is when the force of blood pumping through your arteries is  too strong. If this condition is not controlled, it may put you at risk for serious complications.  Your personal target blood pressure may vary depending on your medical conditions, your age, and other factors. For most people, a normal blood pressure is less than 120/80.  Hypertension is treated with lifestyle changes, medicines, or a combination of both. Lifestyle changes include weight loss, eating a healthy, low-sodium diet, exercising more, and limiting alcohol. This information is not intended to replace advice given to you by your health care provider. Make sure you discuss any questions you have with your health care provider. Document Released: 12/29/2004 Document Revised: 11/27/2015 Document Reviewed: 11/27/2015 Elsevier Interactive Patient Education  2017 [employer].

## 2016-03-30 NOTE — Progress Notes (Signed)
Patient ID: PEPE MINEAU, male   DOB: 01-Oct-1929, 83 y.o.   MRN: [id number]   Subjective:  I acted as a Education administrator for Penni Homans, [place], [place]   Patient ID: [name], male    DOB: [birth year], 83 y.o.   MRN: [id number]  Chief Complaint  Patient presents with  . Follow-up    Medication.    HPI  Patient is in today for a medication follow up. Also states that he has ongoing chronic back and shoulder pain. Patient has a Hx of HTN, asthma, vitamin d deficiency, hyperlipidemia. Patient has no additional acute concerns noted at this time. He does endorse some neck pain associated with his shoulder pain but no recent injury or radiculopathy. Denies CP/palp/SOB/HA/congestion/fevers or GU c/o. Taking meds as prescribed. He notes fatigue and nausea plague him most days.  Patient Care Team: [name], MD as PCP - General (Family Medicine) Fay Records, MD (Cardiology) Lindwood Coke, MD as Attending Physician (Dermatology) Bjorn Loser, MD as Attending Physician (Urology) Ninetta Lights, MD (Orthopedic Surgery) Rigoberto Noel, MD (Pulmonary Disease) Rosetta Posner, MD (Vascular Surgery) Katy Apo, MD (Ophthalmology)   Past Medical History:  Diagnosis Date  . Acute bronchitis 06/10/2015  . Allergic rhinitis   . Anemia   . Asthma   . Carotid stenosis    a. s/p Left CEA 2002;  b. Carotid US 3/16:  patent R CEA, L < 40%  . Chronic pain syndrome    Left shoulder, back  . Diverticulosis   . Dyslipidemia   . Epigastric pain 04/05/2016  . Esophageal stricture    Distal, benign  . GERD (gastroesophageal reflux disease)   . Hemorrhoids   . HTN (hypertension)    Negative renal duplex 07-22-11  . Hx of echocardiogram    a. Echo 10/11: mod LVH, EF 55-60%  . Pneumonia 07-2013  . Prostate cancer ([place]) 2000   Seed XRT  . Spondylosis, cervical, with myelopathy 11/26/2015  . Stroke [facility]) 8/01   right thalamic - on chronic Plavix/ASA    Past Surgical History:  Procedure  Laterality Date  . APPENDECTOMY  1953  . CAROTID ENDARTERECTOMY Right 01/2000  . CATARACT EXTRACTION Bilateral   . CERVICAL DISC SURGERY  8/07  . INSERTION PROSTATE RADIATION SEED  2000  . [place] SURGERY  1990s  . NASAL SINUS SURGERY     x 3    Family History  Problem Relation Age of Onset  . Stroke Brother   . Stroke Sister   . Stroke Mother   . Hyperlipidemia Mother   . Hypertension Mother   . Lung disease Father   . Diabetes Son   . Gout Son   . Obesity Sister   . Alcohol abuse Brother   . Cancer Brother     lung, smoker    Social History   Social History  . Marital status: Married    Spouse name: N/A  . Number of children: 1  . Years of education: 10   Occupational History  . Retired    Social History Main Topics  . Smoking status: Former Smoker    Packs/day: 0.25    Years: 35.00    Types: Cigarettes    Quit date: 01/12/1978  . Smokeless tobacco: Current User    Types: Chew  . Alcohol use No     Comment: Prior alcoholic, sober since 8546  . Drug use: No  . Sexual activity: Not Currently  Other Topics Concern  . Not on file   Social History Narrative   Lives at home w/ his wife   Right-handed    Caffeine: drinks a lot of coffee per report    Outpatient Medications Prior to Visit  Medication Sig Dispense Refill  . albuterol (PROVENTIL HFA;VENTOLIN HFA) 108 (90 Base) MCG/ACT inhaler Inhale 1-2 puffs into the lungs every 6 (six) hours as needed for wheezing or shortness of breath. 1 Inhaler 6  . amLODipine (NORVASC) 5 MG tablet TAKE 1 TABLET BY MOUTH TWICE A DAY 180 tablet 1  . aspirin 325 MG tablet Take 325 mg by mouth daily.    [name] Kitchen atorvastatin (LIPITOR) 40 MG tablet Take 1 tablet (40 mg total) by mouth daily. 90 tablet 1  . cetirizine (ZYRTEC) 10 MG tablet Take 10 mg by mouth daily.    . cholecalciferol (VITAMIN D) 1000 UNITS tablet Take 1,000 Units by mouth daily.    . clopidogrel (PLAVIX) 75 MG tablet Take 1 tablet (75 mg total) by mouth  daily. 90 tablet 0  . docusate sodium (COLACE) 100 MG capsule Take 100 mg by mouth 2 (two) times daily.    . fluticasone (FLONASE) 50 MCG/ACT nasal spray PLACE 2 SPRAYS INTO BOTH NOSTRILS DAILY. 16 g 6  . gabapentin (NEURONTIN) 300 MG capsule TAKE 1 CAPSULE BY MOUTH 3 TIMES DAILY. 270 capsule 1  . methyldopa (ALDOMET) 500 MG tablet Take 2 tablets by mouth in the morning, 1 tablet by mouth mid day and 2 tablets by mouth in the evening 450 tablet 3  . montelukast (SINGULAIR) 10 MG tablet Take 1 tablet (10 mg total) by mouth at bedtime. 90 tablet 0  . omeprazole (PRILOSEC) 40 MG capsule Take 1 capsule (40 mg total) by mouth daily. 90 capsule 1  . ondansetron (ZOFRAN) 4 MG tablet Take 1 tablet (4 mg total) by mouth every 8 (eight) hours as needed for nausea or vomiting. 30 tablet 1  . phenazopyridine (PYRIDIUM) 100 MG tablet Take 1 tablet (100 mg total) by mouth 3 (three) times daily as needed for pain. 20 tablet 0  . polyethylene glycol (MIRALAX / GLYCOLAX) packet Take 17 g by mouth as needed for mild constipation.     . senna-docusate (SENOKOT-S) 8.6-50 MG per tablet Take 1 tablet by mouth daily as needed for mild constipation.     . triamcinolone cream (KENALOG) 0.5 % Apply 1 application topically as needed (for skin irritation).     . Wheat Dextrin (BENEFIBER PO) Take 1 scoop by mouth 2 (two) times daily. Powder- Mix with liquid    . azithromycin (ZITHROMAX) 250 MG tablet 2 tabs po once then 1 tab po daily x 4 days 6 tablet 0  . doxycycline (VIBRA-TABS) 100 MG tablet Take 1 tablet (100 mg total) by mouth 2 (two) times daily. 14 tablet 0  . traMADol (ULTRAM) 50 MG tablet Take 1 tablet (50 mg total) by mouth every 8 (eight) hours as needed. 40 tablet 0   No facility-administered medications prior to visit.     Allergies  Allergen Reactions  . Diovan [Valsartan] Other (See Comments)    Elevated potasium hyperkalemia  . Hctz [Hydrochlorothiazide] Other (See Comments)    Swelling and dyspnea     . Oxycodone-Acetaminophen Nausea And Vomiting  . Codeine Nausea Only  . Oxycodone-Acetaminophen Nausea Only    Review of Systems  Constitutional: Positive for malaise/fatigue. Negative for fever.  HENT: Negative for congestion.   Eyes: Negative for blurred vision.  Respiratory: Negative for cough and shortness of breath.   Cardiovascular: Negative for chest pain, palpitations and leg swelling.  Gastrointestinal: Positive for nausea. Negative for vomiting.  Musculoskeletal: Positive for joint pain, myalgias and neck pain.  Skin: Negative for rash.  Neurological: Negative for loss of consciousness and headaches.       Objective:    Physical Exam  Constitutional: He is oriented to person, place, and time. He appears well-developed and well-nourished. No distress.  HENT:  Head: Normocephalic and atraumatic.  Eyes: Conjunctivae are normal.  Neck: Normal range of motion. No thyromegaly present.  Cardiovascular: Normal rate and regular rhythm.   Pulmonary/Chest: Effort normal and breath sounds normal. He has no wheezes.  Abdominal: Soft. Bowel sounds are normal. There is no tenderness.  Musculoskeletal: He exhibits no edema or deformity.  Neurological: He is alert and oriented to person, place, and time.  Skin: Skin is warm and dry. He is not diaphoretic.  Psychiatric: He has a normal mood and affect.    BP (!) 145/80 (BP Location: Left Arm, Patient Position: Sitting, Cuff Size: Normal)   Pulse 73   Temp 97.8 F (36.6 C) (Oral)   Wt 169 lb 3.2 oz (76.7 kg)   SpO2 98% Comment: RA  BMI 23.60 kg/m  Wt Readings from Last 3 Encounters:  03/30/16 169 lb 3.2 oz (76.7 kg)  02/24/16 169 lb (76.7 kg)  11/26/15 170 lb 4 oz (77.2 kg)   BP Readings from Last 3 Encounters:  03/30/16 (!) 145/80  02/24/16 (!) 100/50  11/26/15 (!) 158/64     Immunization History  Administered Date(s) Administered  . Influenza Split 09/12/2012  . Influenza Whole 11/04/2006, 10/11/2008, 09/24/2011   . Influenza, High Dose Seasonal PF 10/08/2015  . Influenza-Unspecified 10/05/2013, 10/13/2014  . Pneumococcal Conjugate-13 10/05/2013  . Pneumococcal Polysaccharide-23 01/13/2003, 12/10/2014  . Tetanus 12/12/2013  . Zoster 11/24/2011    Health Maintenance  Topic Date Due  . Samul Dada  12/13/2023  . INFLUENZA VACCINE  Addressed  . PNA vac Low Risk Adult  Completed    Lab Results  Component Value Date   WBC 8.0 03/30/2016   HGB 13.8 03/30/2016   HCT 40.7 03/30/2016   PLT 251.0 03/30/2016   GLUCOSE 85 03/30/2016   CHOL 111 03/30/2016   TRIG 116.0 03/30/2016   HDL 42.20 03/30/2016   LDLCALC 45 03/30/2016   ALT 8 03/30/2016   AST 12 03/30/2016   NA 142 03/30/2016   K 5.0 03/30/2016   CL 107 03/30/2016   CREATININE 0.99 03/30/2016   BUN 24 (H) 03/30/2016   CO2 27 03/30/2016   TSH 2.46 03/30/2016   PSA 0.02 (L) 12/11/2008   INR 0.97 02/06/2010   HGBA1C 5.7 03/30/2016    Lab Results  Component Value Date   TSH 2.46 03/30/2016   Lab Results  Component Value Date   WBC 8.0 03/30/2016   HGB 13.8 03/30/2016   HCT 40.7 03/30/2016   MCV 90.9 03/30/2016   PLT 251.0 03/30/2016   Lab Results  Component Value Date   NA 142 03/30/2016   K 5.0 03/30/2016   CO2 27 03/30/2016   GLUCOSE 85 03/30/2016   BUN 24 (H) 03/30/2016   CREATININE 0.99 03/30/2016   BILITOT 0.4 03/30/2016   ALKPHOS 52 03/30/2016   AST 12 03/30/2016   ALT 8 03/30/2016   PROT 6.3 03/30/2016   ALBUMIN 4.0 03/30/2016   CALCIUM 9.3 03/30/2016   ANIONGAP 11 08/11/2013   GFR 76.43 03/30/2016  Lab Results  Component Value Date   CHOL 111 03/30/2016   Lab Results  Component Value Date   HDL 42.20 03/30/2016   Lab Results  Component Value Date   LDLCALC 45 03/30/2016   Lab Results  Component Value Date   TRIG 116.0 03/30/2016   Lab Results  Component Value Date   CHOLHDL 3 03/30/2016   Lab Results  Component Value Date   HGBA1C 5.7 03/30/2016         Assessment & Plan:    Problem List Items Addressed This Visit    Vitamin D deficiency    Check level today      Relevant Orders   VITAMIN D 25 Hydroxy (Vit-D Deficiency, Fractures) (Completed)   HLD (hyperlipidemia)    Encouraged heart healthy diet, increase exercise, avoid trans fats, consider a krill oil cap daily      Relevant Orders   Lipid panel (Completed)   HYPERTENSION, BENIGN ESSENTIAL    Well controlled, no changes to meds. Encouraged heart healthy diet such as the DASH diet and exercise as tolerated.       Relevant Medications   traMADol (ULTRAM) 50 MG tablet   Other Relevant Orders   CBC (Completed)   Comprehensive metabolic panel (Completed)   TSH (Completed)   Disorder of bursae and tendons in shoulder region     Decreased ROM consider frozen shoulder offered referral to pt given refill on Tramadol and contract updated and UDS      Hyperglycemia - Primary    minimize simple carbs. Increase exercise as tolerated.       Relevant Orders   Hemoglobin A1c (Completed)   Epigastric pain    With nausea and weight loss, symptoms are stable but has been referred to gastroenterology for further consideration       Other Visit Diagnoses    Weakness       Relevant Medications   traMADol (ULTRAM) 50 MG tablet   Other fatigue       Relevant Medications   traMADol (ULTRAM) 50 MG tablet      I have discontinued Mr. Hetzer azithromycin and doxycycline. I am also having him maintain his polyethylene glycol, senna-docusate, docusate sodium, triamcinolone cream, Wheat Dextrin (BENEFIBER PO), cetirizine, aspirin, cholecalciferol, omeprazole, phenazopyridine, albuterol, methyldopa, montelukast, atorvastatin, fluticasone, clopidogrel, amLODipine, gabapentin, ondansetron, and traMADol.  Meds ordered this encounter  Medications  . traMADol (ULTRAM) 50 MG tablet    Sig: Take 1 tablet (50 mg total) by mouth every 8 (eight) hours as needed.    Dispense:  90 tablet    Refill:  0    CMA  served as scribe during this visit. History, Physical and Plan performed by medical provider. Documentation and orders reviewed and attested to.  Penni Homans, MD

## 2016-03-30 NOTE — Assessment & Plan Note (Signed)
Check cbc 

## 2016-03-30 NOTE — Assessment & Plan Note (Signed)
Check level today 

## 2016-03-30 NOTE — Assessment & Plan Note (Addendum)
Decreased ROM consider frozen shoulder offered referral to pt given refill on Tramadol and contract updated and UDS

## 2016-03-30 NOTE — Assessment & Plan Note (Signed)
Well controlled, no changes to meds. Encouraged heart healthy diet such as the DASH diet and exercise as tolerated.  °

## 2016-03-30 NOTE — Progress Notes (Signed)
Pre visit review using our clinic review tool, if applicable. No additional management support is needed unless otherwise documented below in the visit note. 

## 2016-03-30 NOTE — Assessment & Plan Note (Signed)
Encouraged heart healthy diet, increase exercise, avoid trans fats, consider a krill oil cap daily 

## 2016-03-31 DIAGNOSIS — Z79891 Long term (current) use of opiate analgesic: Secondary | ICD-10-CM | POA: Diagnosis not present

## 2016-03-31 LAB — COMPREHENSIVE METABOLIC PANEL
ALBUMIN: 4 g/dL (ref 3.5–5.2)
ALK PHOS: 52 U/L (ref 39–117)
ALT: 8 U/L (ref 0–53)
AST: 12 U/L (ref 0–37)
BILIRUBIN TOTAL: 0.4 mg/dL (ref 0.2–1.2)
BUN: 24 mg/dL — ABNORMAL HIGH (ref 6–23)
CALCIUM: 9.3 mg/dL (ref 8.4–10.5)
CO2: 27 mEq/L (ref 19–32)
CREATININE: 0.99 mg/dL (ref 0.40–1.50)
Chloride: 107 mEq/L (ref 96–112)
GFR: 76.43 mL/min (ref >60.00)
Glucose, Bld: 85 mg/dL (ref 70–99)
Potassium: 5 mEq/L (ref 3.5–5.1)
Sodium: 142 mEq/L (ref 135–145)
TOTAL PROTEIN: 6.3 g/dL (ref 6.0–8.3)

## 2016-03-31 LAB — CBC
HCT: 40.7 % (ref 39.0–52.0)
HEMOGLOBIN: 13.8 g/dL (ref 13.0–17.0)
MCHC: 34 g/dL (ref 30.0–36.0)
MCV: 90.9 fl (ref 78.0–100.0)
PLATELETS: 251 10*3/uL (ref 150.0–400.0)
RBC: 4.48 Mil/uL (ref 4.22–5.81)
RDW: 14.5 % (ref 11.5–15.5)
WBC: 8 10*3/uL (ref 4.0–10.5)

## 2016-03-31 LAB — LIPID PANEL
CHOL/HDL RATIO: 3
CHOLESTEROL: 111 mg/dL (ref 0–200)
HDL: 42.2 mg/dL (ref >39.00)
LDL Cholesterol: 45 mg/dL (ref 0–99)
NonHDL: 68.65
Triglycerides: 116 mg/dL (ref 0.0–149.0)
VLDL: 23.2 mg/dL (ref 0.0–40.0)

## 2016-03-31 LAB — VITAMIN D 25 HYDROXY (VIT D DEFICIENCY, FRACTURES): VITD: 21.12 ng/mL — ABNORMAL LOW (ref 30.00–100.00)

## 2016-03-31 LAB — TSH: TSH: 2.46 u[IU]/mL (ref 0.35–4.50)

## 2016-03-31 LAB — HEMOGLOBIN A1C: HEMOGLOBIN A1C: 5.7 % (ref 4.6–6.5)

## 2016-04-02 ENCOUNTER — Encounter: Payer: PPO | Admitting: Gastroenterology

## 2016-04-02 ENCOUNTER — Other Ambulatory Visit: Payer: Self-pay | Admitting: Family Medicine

## 2016-04-02 MED ORDER — VITAMIN D (ERGOCALCIFEROL) 1.25 MG (50000 UNIT) PO CAPS: 50000.0000 [IU] | ORAL_CAPSULE | ORAL | 4 refills | Status: DC

## 2016-04-02 MED FILL — VIT D2 1.25 MG (50,000 UNIT: 1.25 MG | 28 days supply | Qty: 4 | Fill #0

## 2016-04-03 ENCOUNTER — Encounter: Payer: Self-pay | Admitting: Vascular Surgery

## 2016-04-03 MED FILL — traMADol HCL 50 MG TABS: 50 | 30 days supply | Qty: 90 | Fill #0

## 2016-04-05 ENCOUNTER — Encounter: Payer: Self-pay | Admitting: Family Medicine

## 2016-04-05 DIAGNOSIS — R1013 Epigastric pain: Secondary | ICD-10-CM

## 2016-04-05 DIAGNOSIS — R739 Hyperglycemia, unspecified: Secondary | ICD-10-CM | POA: Insufficient documentation

## 2016-04-05 HISTORY — DX: Epigastric pain: R10.13

## 2016-04-05 NOTE — Assessment & Plan Note (Signed)
With nausea and weight loss, symptoms are stable but has been referred to gastroenterology for further consideration

## 2016-04-05 NOTE — Assessment & Plan Note (Signed)
minimize simple carbs. Increase exercise as tolerated.  

## 2016-04-09 ENCOUNTER — Ambulatory Visit: Payer: PPO | Admitting: Neurology

## 2016-04-13 ENCOUNTER — Other Ambulatory Visit: Payer: Self-pay | Admitting: Family Medicine

## 2016-04-13 DIAGNOSIS — I6350 Cerebral infarction due to unspecified occlusion or stenosis of unspecified cerebral artery: Secondary | ICD-10-CM

## 2016-04-13 DIAGNOSIS — E782 Mixed hyperlipidemia: Secondary | ICD-10-CM

## 2016-04-13 DIAGNOSIS — E559 Vitamin D deficiency, unspecified: Secondary | ICD-10-CM

## 2016-04-13 DIAGNOSIS — I10 Essential (primary) hypertension: Secondary | ICD-10-CM

## 2016-04-13 MED FILL — ATORVASTATIN 40 MG TABLET: 40 | 90 days supply | Qty: 90 | Fill #0

## 2016-04-13 MED FILL — METHYLDOPA 250 MG TAB: 250 | 90 days supply | Qty: 900 | Fill #4

## 2016-04-14 ENCOUNTER — Encounter: Payer: Self-pay | Admitting: Vascular Surgery

## 2016-04-14 ENCOUNTER — Ambulatory Visit (INDEPENDENT_AMBULATORY_CARE_PROVIDER_SITE_OTHER): Payer: PPO | Admitting: Vascular Surgery

## 2016-04-14 ENCOUNTER — Ambulatory Visit (HOSPITAL_COMMUNITY)
Admission: RE | Admit: 2016-04-14 | Discharge: 2016-04-14 | Disposition: A | Discharge status: Home / Self Care | Payer: PPO | Source: Ambulatory Visit | Attending: Vascular Surgery | Admitting: Vascular Surgery

## 2016-04-14 VITALS — BP 122/64 | HR 62 | Temp 98.5°F | Resp 16 | Ht 71.0 in | Wt 162.0 lb

## 2016-04-14 DIAGNOSIS — I6522 Occlusion and stenosis of left carotid artery: Secondary | ICD-10-CM | POA: Diagnosis not present

## 2016-04-14 DIAGNOSIS — I6523 Occlusion and stenosis of bilateral carotid arteries: Secondary | ICD-10-CM | POA: Diagnosis not present

## 2016-04-14 DIAGNOSIS — I6521 Occlusion and stenosis of right carotid artery: Secondary | ICD-10-CM | POA: Diagnosis not present

## 2016-04-14 LAB — VAS US CAROTID
LCCADDIAS: 13 cm/s
LCCADSYS: 98 cm/s
LEFT ECA DIAS: -2 cm/s
LICADDIAS: -18 cm/s
LICADSYS: -89 cm/s
LICAPDIAS: 15 cm/s
LICAPSYS: 78 cm/s
Left CCA prox dias: 14 cm/s
Left CCA prox sys: 75 cm/s
RCCADSYS: -79 cm/s
RCCAPSYS: 67 cm/s
RIGHT CCA MID DIAS: 8 cm/s
RIGHT ECA DIAS: 9 cm/s
RIGHT VERTEBRAL DIAS: -14 cm/s
Right CCA prox dias: 7 cm/s

## 2016-04-14 NOTE — Progress Notes (Signed)
Vascular and Vein Specialist of [facility]  Patient name: [name] MRN: [id number] DOB: 04-18-1929 Sex: male  REASON FOR VISIT: Follow-up carotid disease  HPI: [name] is a 83 y.o. male here today for follow-up of carotid disease. He is status post right carotid endarterectomy by myself in June 2012. He denies any new neurologic deficits. He is here today with his wife. She reports that he has had a diminished appetite and some weight loss. He reports that he has lost interest in food particularly in meat.  Past Medical History:  Diagnosis Date  . Acute bronchitis 06/10/2015  . Allergic rhinitis   . Anemia   . Asthma   . Carotid stenosis    a. s/p Left CEA 2002;  b. Carotid US 3/16:  patent R CEA, L < 40%  . Chronic pain syndrome    Left shoulder, back  . Diverticulosis   . Dyslipidemia   . Epigastric pain 04/05/2016  . Esophageal stricture    Distal, benign  . GERD (gastroesophageal reflux disease)   . Hemorrhoids   . HTN (hypertension)    Negative renal duplex 07-22-11  . Hx of echocardiogram    a. Echo 10/11: mod LVH, EF 55-60%  . Pneumonia 07-2013  . Prostate cancer ([facility]) 2000   Seed XRT  . Spondylosis, cervical, with myelopathy 11/26/2015  . Stroke [facility]) 8/01   right thalamic - on chronic Plavix/ASA    Family History  Problem Relation Age of Onset  . Stroke Brother   . Stroke Sister   . Stroke Mother   . Hyperlipidemia Mother   . Hypertension Mother   . Lung disease Father   . Diabetes Son   . Gout Son   . Obesity Sister   . Alcohol abuse Brother   . Cancer Brother     lung, smoker    SOCIAL HISTORY: Social History  Substance Use Topics  . Smoking status: Former Smoker    Packs/day: 0.25    Years: 35.00    Types: Cigarettes    Quit date: 01/12/1978  . Smokeless tobacco: Current User    Types: Chew  . Alcohol use No     Comment: Prior alcoholic, sober since 2536    Allergies  Allergen Reactions  .  Diovan [Valsartan] Other (See Comments)    Elevated potasium hyperkalemia  . Hctz [Hydrochlorothiazide] Other (See Comments)    Swelling and dyspnea   . Oxycodone-Acetaminophen Nausea And Vomiting  . Metformin And Related   . Codeine Nausea Only  . Oxycodone-Acetaminophen Nausea Only    Current Outpatient Prescriptions  Medication Sig Dispense Refill  . albuterol (PROVENTIL HFA;VENTOLIN HFA) 108 (90 Base) MCG/ACT inhaler Inhale 1-2 puffs into the lungs every 6 (six) hours as needed for wheezing or shortness of breath. 1 Inhaler 6  . amLODipine (NORVASC) 5 MG tablet TAKE 1 TABLET BY MOUTH TWICE A DAY 180 tablet 1  . aspirin 325 MG tablet Take 325 mg by mouth daily.    [name] Kitchen atorvastatin (LIPITOR) 40 MG tablet TAKE 1 TABLET BY MOUTH ONCE DAILY 90 tablet 1  . cetirizine (ZYRTEC) 10 MG tablet Take 10 mg by mouth daily.    . cholecalciferol (VITAMIN D) 1000 UNITS tablet Take 1,000 Units by mouth daily.    . clopidogrel (PLAVIX) 75 MG tablet Take 1 tablet (75 mg total) by mouth daily. 90 tablet 0  . docusate sodium (COLACE) 100 MG capsule Take 100 mg by mouth 2 (two) times daily.    [name] Kitchen  fluticasone (FLONASE) 50 MCG/ACT nasal spray PLACE 2 SPRAYS INTO BOTH NOSTRILS DAILY. 16 g 6  . gabapentin (NEURONTIN) 300 MG capsule TAKE 1 CAPSULE BY MOUTH 3 TIMES DAILY. 270 capsule 1  . methyldopa (ALDOMET) 500 MG tablet Take 2 tablets by mouth in the morning, 1 tablet by mouth mid day and 2 tablets by mouth in the evening 450 tablet 3  . montelukast (SINGULAIR) 10 MG tablet Take 1 tablet (10 mg total) by mouth at bedtime. 90 tablet 0  . omeprazole (PRILOSEC) 40 MG capsule Take 1 capsule (40 mg total) by mouth daily. 90 capsule 1  . ondansetron (ZOFRAN) 4 MG tablet Take 1 tablet (4 mg total) by mouth every 8 (eight) hours as needed for nausea or vomiting. 30 tablet 1  . phenazopyridine (PYRIDIUM) 100 MG tablet Take 1 tablet (100 mg total) by mouth 3 (three) times daily as needed for pain. 20 tablet 0  .  polyethylene glycol (MIRALAX / GLYCOLAX) packet Take 17 g by mouth as needed for mild constipation.     . senna-docusate (SENOKOT-S) 8.6-50 MG per tablet Take 1 tablet by mouth daily as needed for mild constipation.     . traMADol (ULTRAM) 50 MG tablet Take 1 tablet (50 mg total) by mouth every 8 (eight) hours as needed. 90 tablet 0  . triamcinolone cream (KENALOG) 0.5 % Apply 1 application topically as needed (for skin irritation).     . Vitamin D, Ergocalciferol, (DRISDOL) 50000 units CAPS capsule Take 1 capsule (50,000 Units total) by mouth every 7 (seven) days. 4 capsule 4  . Wheat Dextrin (BENEFIBER PO) Take 1 scoop by mouth 2 (two) times daily. Powder- Mix with liquid     No current facility-administered medications for this visit.     REVIEW OF SYSTEMS:  '[X]'$  denotes positive finding, '[ ]'$  denotes negative finding Cardiac  Comments:  Chest pain or chest pressure:    Shortness of breath upon exertion:    Short of breath when lying flat:    Irregular heart rhythm:        Vascular    Pain in calf, thigh, or hip brought on by ambulation:    Pain in feet at night that wakes you up from your sleep:     Blood clot in your veins:    Leg swelling:           PHYSICAL EXAM: Vitals:   04/14/16 1456 04/14/16 1500  BP: 137/65 122/64  Pulse: 62   Resp: 16   Temp: 98.5 F (36.9 C)   TempSrc: Oral   SpO2: 96%   Weight: 162 lb (73.5 kg)   Height: '5\' 11"'$  (1.803 m)     GENERAL: The patient is a well-nourished male, in no acute distress. The vital signs are documented above. CARDIOVASCULAR: Carotid arteries without bruits bilaterally. Well-healed right neck incision. 2+ radial pulses bilaterally PULMONARY: There is good air exchange  MUSCULOSKELETAL: There are no major deformities or cyanosis. NEUROLOGIC: No focal weakness or paresthesias are detected. SKIN: There are no ulcers or rashes noted. PSYCHIATRIC: The patient has a normal affect.  DATA:  Widely patent endarterectomy on the  right and mild less than 40% stenosis on the left  MEDICAL ISSUES: Patient is reassured with his studies. He'll notify should he develop any neurologic deficits. Otherwise we will see him again in one year with repeat carotid duplex follow-up    Rosetta Posner, MD [facility] V[facility] and Ve[facility] Specialists of [facility] Tel [phone]  Pager [phone]

## 2016-04-15 ENCOUNTER — Telehealth: Payer: Self-pay | Admitting: Family Medicine

## 2016-04-15 NOTE — Addendum Note (Signed)
Addended by: Lianne Cure A on: 04/15/2016 09:57 AM   Modules accepted: Orders

## 2016-04-15 NOTE — Telephone Encounter (Signed)
Patient request to have a copy of last labs mailed to him

## 2016-04-16 NOTE — Telephone Encounter (Signed)
MAILED A COPY TO HIS HOME

## 2016-04-21 ENCOUNTER — Other Ambulatory Visit: Payer: Self-pay | Admitting: Internal Medicine

## 2016-04-21 MED FILL — AMLODIPINE BESYLATE 5 MG TA: 5 | 90 days supply | Qty: 180 | Fill #0

## 2016-04-23 ENCOUNTER — Ambulatory Visit (INDEPENDENT_AMBULATORY_CARE_PROVIDER_SITE_OTHER): Payer: PPO | Admitting: Internal Medicine

## 2016-04-23 ENCOUNTER — Telehealth: Payer: Self-pay

## 2016-04-23 ENCOUNTER — Encounter: Payer: Self-pay | Admitting: Internal Medicine

## 2016-04-23 VITALS — BP 98/40 | HR 62 | Ht 71.0 in | Wt 166.2 lb

## 2016-04-23 DIAGNOSIS — R634 Abnormal weight loss: Secondary | ICD-10-CM | POA: Diagnosis not present

## 2016-04-23 DIAGNOSIS — G4710 Hypersomnia, unspecified: Secondary | ICD-10-CM | POA: Diagnosis not present

## 2016-04-23 DIAGNOSIS — R110 Nausea: Secondary | ICD-10-CM

## 2016-04-23 DIAGNOSIS — R6881 Early satiety: Secondary | ICD-10-CM | POA: Diagnosis not present

## 2016-04-23 MED ORDER — SERTRALINE HCL 50 MG PO TABS: 50.0000 mg | ORAL_TABLET | Freq: Every day | ORAL | 5 refills | Status: DC

## 2016-04-23 MED FILL — ONDANSETRON HCL 4 MG TABLET: 4 | 10 days supply | Qty: 30 | Fill #1

## 2016-04-23 NOTE — Progress Notes (Signed)
[name] 83 y.o. [birth year] [id number]  Assessment & Plan:   Encounter Diagnoses  Name Primary?  . Loss of weight Yes  . Nausea without vomiting   . Early satiety   . Hypersomnolence    I have explained that his symptom complex has many features of depression which is often atypical in older adults but that the weight loss, and other GI symptoms could represent something like a gastrointestinal infection or malignancy. Though unlikely it's possible. He has had these symptoms in the past as well. Wintertime is worse for him he said and that might have aggravated things for sure. The symptoms seem to be before and now after the winter though.   Will evaluate with EGD to be sure there is no ulcer, inflammatory process or malignancy leading to these symptoms. We will do that on Plavix and aspirin as I think there is minimal bleeding risk with that even if I take biopsies. They were concerned about this and the overall risks of the procedure but I explained them very carefully and they read the handout we have.  The risks and benefits as well as alternatives of endoscopic procedure(s) have been discussed and reviewed. All questions answered. The patient agrees to proceed.  Start sertraline 50 mg tabs take 1/2 tab daily first 2 weeks. He decided to go ahead and try that given the depressive symptomatology that is present. Hopefully this will not make him sleep year, and will stimulate his appetite reduce the nausea. We will try to renew the ondansetron a prior authorization was requested. In his age group, using medication such as promethazine or prochlorperazine are contraindicated I think due to the safety profile. I have reviewed many of the potential side effects of sertraline with the patient and his wife and told in the watch out for things though there isn't a dictation. To these medications they're to call if they have questions or concerns.  I appreciate the opportunity to care for  this patient. CC: Penni Homans, MD    Subjective:   Chief Complaint: Nausea anorexia. weight loss.  HPI The patient is a pleasant elderly white man here with his wife, because he said several months or more of nausea, anorexia and some weight loss. About a year ago and was 175 pounds 166 today. He said that he when he awakens in the morning he doesn't feel like eating he's nauseous. He has early satiety if he does eat. His wife says he likes honey buns and does eat those. He saw one of my partners, but for some reason was dissatisfied and would not proceed with an EGD as recommended. His wife has suffered a colonic perforation due to a colonoscopy and they are leery of procedures.  In the past he has had some dysphagia and esophageal stricture was dilated, last EGD 2011 by Dr. Olevia Perches. No dysphagia since then. He had some mild gastritis then.  He endorses anhedonia there is hypersomnolence he sleeps 12 hours or more a day. His wife says she can wake him up on Sundays but beyond that it's hard to get him out of bed in the morning. There is fatigue and lack of energy. He is not suicidal. More lately he is doing some yard work. He found some Megace that she had around him is trying to give them that but really was told that it might not. Ondansetron will help nausea he uses that intermittently not commonly. He is not having any difficulty with his  chronic constipation at this time that is managed well. There is upper abdominal epigastric pain or discomfort at times though is mild.  Wt Readings from Last 3 Encounters:  04/23/16 166 lb 4 oz (75.4 kg)  04/14/16 162 lb (73.5 kg)  03/30/16 169 lb 3.2 oz (76.7 kg)   Medications, allergies, past medical history, past surgical history, family history and social history are reviewed and updated in the EMR.  Review of Systems As above   Objective:   Physical Exam BP (!) 98/40   Pulse 62   Ht '5\' 11"'$  (1.803 m)   Wt 166 lb 4 oz (75.4 kg)   BMI 23.19  kg/m  Elderly man in no acute distress he looks well overall Eyes are anicteric Neck is supple without lymphadenopathy or mass. There is no axillary adenopathy either. Lungs are clear there is mild kyphosis and somewhat decreased breath sounds throughout Heart sounds mildly distant S1-S2 no rubs murmurs or gallops Chest shows pectus excavatum Abdomen is soft nontender and benign no bruits His mood seems appropriate and affect looks normal as well

## 2016-04-23 NOTE — Telephone Encounter (Signed)
I called and spoke to [place] at [phone] [facility] .  She did an over ride she called it for the patients ondansetron.  [place] know and also left a message informing the [place] on their voice mail.  It's not really a prior authorization needed for this medicine.

## 2016-04-23 NOTE — Patient Instructions (Addendum)
You have been scheduled for an endoscopy. Please follow written instructions given to you at your visit today. If you use inhalers (even only as needed), please bring them with you on the day of your procedure.  Stay on your Plavix per Dr Carlean Purl.   We will work on getting your ondansetron approved thru your insurance company.    We have sent the following medications to your pharmacy for you to pick up at your convenience: Generic zoloft  I appreciate the opportunity to care for you. Silvano Rusk, MD, [facility]

## 2016-05-01 ENCOUNTER — Other Ambulatory Visit: Payer: Self-pay | Admitting: Family Medicine

## 2016-05-01 DIAGNOSIS — R5383 Other fatigue: Secondary | ICD-10-CM

## 2016-05-01 DIAGNOSIS — I10 Essential (primary) hypertension: Secondary | ICD-10-CM

## 2016-05-01 DIAGNOSIS — R531 Weakness: Secondary | ICD-10-CM

## 2016-05-01 NOTE — Telephone Encounter (Signed)
Tramadol Last filled 03/30/16 #90 Last ov 04/13/16 Next ov 10/22/16 UDS due 09/30/16 Contract signed 03/30/16

## 2016-05-04 MED FILL — AMOX-CLAV 500-125 MG TABLET: 500-125 | 10 days supply | Qty: 30 | Fill #0

## 2016-05-04 MED FILL — traMADol HCL 50 MG TABS: 50 | 30 days supply | Qty: 90 | Fill #0

## 2016-05-04 MED FILL — CLOPIDOGREL 75 MG TABLET: 75 | 90 days supply | Qty: 90 | Fill #0

## 2016-05-04 NOTE — Telephone Encounter (Signed)
Faxed hardcopy for tramadol to [facility]

## 2016-05-06 ENCOUNTER — Encounter: Payer: Self-pay | Admitting: Internal Medicine

## 2016-05-08 MED FILL — VIT D2 1.25 MG (50,000 UNIT: 1.25 MG | 28 days supply | Qty: 4 | Fill #1

## 2016-05-13 ENCOUNTER — Telehealth: Payer: Self-pay | Admitting: Internal Medicine

## 2016-05-13 NOTE — Telephone Encounter (Signed)
Left a message to call me back

## 2016-05-14 NOTE — Telephone Encounter (Signed)
Left message to call me back.

## 2016-05-15 NOTE — Telephone Encounter (Signed)
We started Zoloft to help depression and appetitie Was going to do Mirtazapine but he was sleeping a lot and that makes you sleep so we did not  Any improvement on the Zoloft (sertraline)?  We can set up a f/u later this month or early June - it can take 2 mos or more to see benefit from Zoloft  I suspect the EGD would be ok but we were going to do it to be sure  I can call her next week if needed

## 2016-05-15 NOTE — Telephone Encounter (Signed)
Mrs. Deines called me back.  She had me cancel her husbands EGD for next week.  She said he refuses to do it.  She said at his last visit you guys were talking about something to replace his megace and then you guys changed the subject and didn't get back to an answer on that.  She said her husband eats lots of Honey Buns and that she does get protein into him as well.  She said he is sleeping well.  Please advise Sir, thank you.

## 2016-05-15 NOTE — Telephone Encounter (Signed)
Left Mrs. Melvin a message to call me back.

## 2016-05-19 MED FILL — SERTRALINE HCL 50 MG TABLET: 50 | 30 days supply | Qty: 30 | Fill #0

## 2016-05-19 NOTE — Telephone Encounter (Signed)
OK 

## 2016-05-19 NOTE — Telephone Encounter (Signed)
I spoke with Mrs. [place]inwood and she said Mr. Aboud is eating better just not a lot.  I told her that it could take up to 2 months for the zoloft to fully help.  We made a f/u appointment for June 21st.  She said you don't have to call her we can go thru me.

## 2016-05-20 ENCOUNTER — Encounter: Payer: PPO | Admitting: Internal Medicine

## 2016-06-01 ENCOUNTER — Other Ambulatory Visit: Payer: Self-pay | Admitting: Family Medicine

## 2016-06-01 DIAGNOSIS — R531 Weakness: Secondary | ICD-10-CM

## 2016-06-01 DIAGNOSIS — R5383 Other fatigue: Secondary | ICD-10-CM

## 2016-06-01 DIAGNOSIS — I10 Essential (primary) hypertension: Secondary | ICD-10-CM

## 2016-06-01 MED FILL — VIT D2 1.25 MG (50,000 UNIT: 1.25 MG | 28 days supply | Qty: 4 | Fill #2

## 2016-06-01 NOTE — Telephone Encounter (Signed)
Faxed hardcopy for Tramadol to [facility]

## 2016-06-01 NOTE — Telephone Encounter (Signed)
Requesting:  tramadol Contract   03/30/2016 UDS    Low risk next due on 09/30/2016 Last OV    03/30/2016----future appt is on 10/22/2016 Last Refill     #90 no refills on 4/222/2018  Please Advise

## 2016-06-02 MED FILL — traMADol HCL 50 MG TABS: 50 | 30 days supply | Qty: 90 | Fill #0

## 2016-06-22 ENCOUNTER — Other Ambulatory Visit: Payer: Self-pay | Admitting: Family Medicine

## 2016-06-22 ENCOUNTER — Telehealth: Payer: Self-pay | Admitting: Family Medicine

## 2016-06-22 DIAGNOSIS — I10 Essential (primary) hypertension: Secondary | ICD-10-CM

## 2016-06-22 DIAGNOSIS — R531 Weakness: Secondary | ICD-10-CM

## 2016-06-22 DIAGNOSIS — R5383 Other fatigue: Secondary | ICD-10-CM

## 2016-06-22 NOTE — Telephone Encounter (Addendum)
°  Relation to pt: spouse / Jentz,Joanne Call back [phone]   Reason for call:   requesting a refill traMADol (ULTRAM) 50 MG tablet, would like to pick up the hard copy prescription early [place] prior to [place] patient is due and would like to refill at [place] pharmacy [place]. Spouse states may be leaving 06/29/16, please advise

## 2016-06-22 NOTE — Telephone Encounter (Signed)
Requesting:   tramadol Contract    03/30/16 UDS    Low risk next is due on 09/30/16 Last OV    03/30/16 Last Refill   #90 on 06/01/2016  Please Advise

## 2016-06-23 MED ORDER — TRAMADOL HCL 50 MG PO TABS: 50.0000 mg | ORAL_TABLET | Freq: Three times a day (TID) | ORAL | 0 refills | Status: DC | PRN

## 2016-06-23 NOTE — Telephone Encounter (Signed)
OK to refill early just put a fill date on rx

## 2016-06-23 NOTE — Telephone Encounter (Signed)
Printed/dated prescription Called the wife informed to pickup at the front desk.

## 2016-06-23 NOTE — Telephone Encounter (Signed)
Requesting:   Tramadol Contract   03/30/16 UDS    Low risk -- next is due on 09/30/16 Last OV    03/30/16---[place] appt is on 10/22/16 Last Refill   #90 no refills on 06/01/2016  Please Advise----Please review patients request for this early refill--

## 2016-06-29 ENCOUNTER — Other Ambulatory Visit: Payer: Self-pay | Admitting: Gastroenterology

## 2016-06-30 MED FILL — ONDANSETRON HCL 4 MG TABLET: 4 | 10 days supply | Qty: 30 | Fill #0

## 2016-06-30 NOTE — Telephone Encounter (Signed)
Refill x 2 

## 2016-06-30 NOTE — Telephone Encounter (Signed)
Last seen on 04-23-2016. Has a follow up on 07-02-2016. Please advise on refills.

## 2016-07-02 ENCOUNTER — Other Ambulatory Visit (INDEPENDENT_AMBULATORY_CARE_PROVIDER_SITE_OTHER): Payer: PPO

## 2016-07-02 ENCOUNTER — Encounter: Payer: Self-pay | Admitting: Internal Medicine

## 2016-07-02 ENCOUNTER — Ambulatory Visit (INDEPENDENT_AMBULATORY_CARE_PROVIDER_SITE_OTHER): Payer: PPO | Admitting: Internal Medicine

## 2016-07-02 ENCOUNTER — Encounter (INDEPENDENT_AMBULATORY_CARE_PROVIDER_SITE_OTHER): Payer: Self-pay

## 2016-07-02 VITALS — BP 140/50 | HR 55 | Ht 71.0 in | Wt 166.0 lb

## 2016-07-02 DIAGNOSIS — R1032 Left lower quadrant pain: Secondary | ICD-10-CM

## 2016-07-02 DIAGNOSIS — R10814 Left lower quadrant abdominal tenderness: Secondary | ICD-10-CM

## 2016-07-02 DIAGNOSIS — R110 Nausea: Secondary | ICD-10-CM

## 2016-07-02 DIAGNOSIS — R197 Diarrhea, unspecified: Secondary | ICD-10-CM

## 2016-07-02 LAB — BASIC METABOLIC PANEL
BUN: 16 mg/dL (ref 6–23)
CHLORIDE: 106 meq/L (ref 96–112)
CO2: 28 mEq/L (ref 19–32)
CREATININE: 0.99 mg/dL (ref 0.40–1.50)
Calcium: 8.9 mg/dL (ref 8.4–10.5)
GFR: 76.39 mL/min (ref >60.00)
Glucose, Bld: 95 mg/dL (ref 70–99)
POTASSIUM: 4.6 meq/L (ref 3.5–5.1)
Sodium: 138 mEq/L (ref 135–145)

## 2016-07-02 LAB — CBC WITH DIFFERENTIAL/PLATELET
BASOS ABS: 0 10*3/uL (ref 0.0–0.1)
Basophils Relative: 0.5 % (ref 0.0–3.0)
Eosinophils Absolute: 0.3 10*3/uL (ref 0.0–0.7)
Eosinophils Relative: 3.8 % (ref 0.0–5.0)
HCT: 38 % — ABNORMAL LOW (ref 39.0–52.0)
HEMOGLOBIN: 13.1 g/dL (ref 13.0–17.0)
LYMPHS ABS: 1.2 10*3/uL (ref 0.7–4.0)
Lymphocytes Relative: 16.4 % (ref 12.0–46.0)
MCHC: 34.5 g/dL (ref 30.0–36.0)
MCV: 90.4 fl (ref 78.0–100.0)
Monocytes Absolute: 0.5 10*3/uL (ref 0.1–1.0)
Monocytes Relative: 7.2 % (ref 3.0–12.0)
Neutro Abs: 5.2 10*3/uL (ref 1.4–7.7)
Neutrophils Relative %: 72.1 % (ref 43.0–77.0)
Platelets: 238 10*3/uL (ref 150.0–400.0)
RBC: 4.2 Mil/uL — AB (ref 4.22–5.81)
RDW: 14 % (ref 11.5–15.5)
WBC: 7.2 10*3/uL (ref 4.0–10.5)

## 2016-07-02 MED ORDER — METOCLOPRAMIDE HCL 5 MG PO TABS: 5.0000 mg | ORAL_TABLET | Freq: Every day | ORAL | 0 refills | Status: DC

## 2016-07-02 MED FILL — traMADol HCL 50 MG TABS: 50 | 30 days supply | Qty: 90 | Fill #0

## 2016-07-02 NOTE — Patient Instructions (Signed)
  Your physician has requested that you go to the basement for the following lab work before leaving today: BMET, CBC/diff   You have been scheduled for a CT scan of the abdomen and pelvis at [facility] ([address]---this is in the same building as [facility]).   You are scheduled on 07/03/16 at 9:30AM. You should arrive 15 minutes prior to your appointment time for registration. Please follow the written instructions below on the day of your exam:  WARNING: IF YOU ARE ALLERGIC TO IODINE/X-RAY DYE, PLEASE NOTIFY RADIOLOGY IMMEDIATELY AT [phone]! YOU WILL BE GIVEN A 13 HOUR PREMEDICATION PREP.  1) Do not eat or drink anything after 5:30AM (4 hours prior to your test) 2) You have been given 2 bottles of oral contrast to drink. The solution may taste  better if refrigerated, but do NOT add ice or any other liquid to this solution. Shake well before drinking.    Drink 1 bottle of contrast @ 7:30AM (2 hours prior to your exam)  Drink 1 bottle of contrast @ 8:30AM (1 hour prior to your exam)  You may take any medications as prescribed with a small amount of water except for the following: Metformin, Glucophage, Glucovance, Avandamet, Riomet, Fortamet, Actoplus Met, Janumet, Glumetza or Metaglip. The above medications must be held the day of the exam AND 48 hours after the exam.  The purpose of you drinking the oral contrast is to aid in the visualization of your intestinal tract. The contrast solution may cause some diarrhea. Before your exam is started, you will be given a small amount of fluid to drink. Depending on your individual set of symptoms, you may also receive an intravenous injection of x-ray contrast/dye. Plan on being at [facility] for 30 minutes or longer, depending on the type of exam you are having performed.  This test typically takes 30-45 minutes to complete.  If you have any questions regarding your exam or if you need to reschedule, you may  call the CT department at [phone] between the hours of 8:00 am and 5:00 pm, Monday-Friday.  ________________________________________________________________________   We have sent the following medications to your pharmacy for you to pick up at your convenience: Reglan   I appreciate the opportunity to care for you. Silvano Rusk, MD, [facility]

## 2016-07-02 NOTE — Progress Notes (Signed)
[name] 83 y.o. [birth year] [id number]  Assessment & Plan:   Encounter Diagnoses  Name Primary?  . Bloody diarrhea Yes  . LLQ pain   . Left lower quadrant abdominal tenderness without rebound tenderness   . Chronic nausea     Seems like this could be ischemic colitis based upon the history. His chronic nausea persists. We tried Zoloft for a while and that didn't really help and the wife thought it made him sleepy year. He is not losing weight. I'm not sure where the chronic nausea is coming from. He does not want to undergo EGD.  Workup and treatment as follows:  BMET CBC CT abd/pelvis  Reglan 5 mg qhs-side effects discussed  I appreciate the opportunity to care for this patient. CC: [name], MD   Subjective:   Chief Complaint:Abdominal pain and bloody diarrhea  HPI The patient is here with his wife, she participates in the history, he was doing reasonably well although he has chronic fatigue and morning nausea. He tried Zoloft for a while but that didn't seem to help so they stopped it and his wife thought it was making him sleep year. About 4-5 days ago he had severe abdominal cramps followed by diarrhea that became bloody. This lasted over a period of about 2 days. No blood since. Less bowel movements now but still tender and cramping in the left lower quadrant. His chronic nausea particularly in the mornings persists. He uses a PPI when necessary heartburn. In the past his appetite had been bad, that's a little better his weight had dropped off but it seems to be stabilized now. His wife and I think here frustrated that they can't get to the [employer]. Her birthday or an anniversary is coming and her daughter is throwing up Artie and Mrs. Pauley is not really happy about that. She doesn't want a party. She wants to get down to the [facility] but she hasn't been able to. They have a trailer and a lot at [facility]. He is also very concerned about her cats as he is. One of  the cats has to go into the vets if they take a trip.  Medications, allergies, past medical history, past surgical history, family history and social history are reviewed and updated in the EMR.   Wt Readings from Last 3 Encounters:  07/02/16 166 lb (75.3 kg)  04/23/16 166 lb 4 oz (75.4 kg)  04/14/16 162 lb (73.5 kg)    Allergies  Allergen Reactions  . Diovan [Valsartan] Other (See Comments)    Elevated potasium hyperkalemia  . Hctz [Hydrochlorothiazide] Other (See Comments)    Swelling and dyspnea   . Oxycodone-Acetaminophen Nausea And Vomiting  . Metformin And Related   . Codeine Nausea Only  . Oxycodone-Acetaminophen Nausea Only   Current Meds  Medication Sig  . albuterol (PROVENTIL HFA;VENTOLIN HFA) 108 (90 Base) MCG/ACT inhaler Inhale 1-2 puffs into the lungs every 6 (six) hours as needed for wheezing or shortness of breath.  [name] Kitchen amLODipine (NORVASC) 5 MG tablet Take 1 tablet (5 mg total) by mouth 2 (two) times daily. *Please call and schedule a one year follow up appointment*  . aspirin 325 MG tablet Take 325 mg by mouth daily.  [name] Kitchen atorvastatin (LIPITOR) 40 MG tablet TAKE 1 TABLET BY MOUTH ONCE DAILY  . cetirizine (ZYRTEC) 10 MG tablet Take 10 mg by mouth daily.  . cholecalciferol (VITAMIN D) 1000 UNITS tablet Take 1,000 Units by mouth daily.  [name] Kitchen  clopidogrel (PLAVIX) 75 MG tablet Take 1 tablet (75 mg total) by mouth daily.  [name] Kitchen docusate sodium (COLACE) 100 MG capsule Take 100 mg by mouth 2 (two) times daily.  . fluticasone (FLONASE) 50 MCG/ACT nasal spray PLACE 2 SPRAYS INTO BOTH NOSTRILS DAILY.  [name] Kitchen gabapentin (NEURONTIN) 300 MG capsule TAKE 1 CAPSULE BY MOUTH 3 TIMES DAILY.  . methyldopa (ALDOMET) 500 MG tablet Take 2 tablets by mouth in the morning, 1 tablet by mouth mid day and 2 tablets by mouth in the evening  . montelukast (SINGULAIR) 10 MG tablet Take 1 tablet (10 mg total) by mouth at bedtime.  [name] Kitchen omeprazole (PRILOSEC) 40 MG capsule Take 1 capsule (40 mg total) by mouth  daily.  . ondansetron (ZOFRAN) 4 MG tablet TAKE 1 TABLET BY MOUTH EVERY 8 HOURS AS NEEDED FOR NAUSEA AND VOMITING  . phenazopyridine (PYRIDIUM) 100 MG tablet Take 1 tablet (100 mg total) by mouth 3 (three) times daily as needed for pain.  . polyethylene glycol (MIRALAX / GLYCOLAX) packet Take 17 g by mouth as needed for mild constipation.   . senna-docusate (SENOKOT-S) 8.6-50 MG per tablet Take 1 tablet by mouth daily as needed for mild constipation.   . sertraline (ZOLOFT) 50 MG tablet Take 1 tablet (50 mg total) by mouth daily. Start with 1/2 tab daily for first 2 weeks  . traMADol (ULTRAM) 50 MG tablet Take 1 tablet (50 mg total) by mouth every 8 (eight) hours as needed.  . triamcinolone cream (KENALOG) 0.5 % Apply 1 application topically as needed (for skin irritation).   . Vitamin D, Ergocalciferol, (DRISDOL) 50000 units CAPS capsule Take 1 capsule (50,000 Units total) by mouth every 7 (seven) days.  . Wheat Dextrin (BENEFIBER PO) Take 1 scoop by mouth 2 (two) times daily. Powder- Mix with liquid   Past Medical History:  Diagnosis Date  . Acute bronchitis 06/10/2015  . Allergic rhinitis   . Anemia   . Asthma   . Carotid stenosis    a. s/p Left CEA 2002;  b. Carotid US 3/16:  patent R CEA, L < 40%  . Chronic pain syndrome    Left shoulder, back  . Diverticulosis   . Dyslipidemia   . Epigastric pain 04/05/2016  . Esophageal stricture    Distal, benign  . GERD (gastroesophageal reflux disease)   . Hemorrhoids   . HTN (hypertension)    Negative renal duplex 07-22-11  . Hx of echocardiogram    a. Echo 10/11: mod LVH, EF 55-60%  . Pneumonia 07-2013  . Prostate cancer ([place]) 2000   Seed XRT  . Spondylosis, cervical, with myelopathy 11/26/2015  . Stroke [facility]) 8/01   right thalamic - on chronic Plavix/ASA   Past Surgical History:  Procedure Laterality Date  . APPENDECTOMY  1953  . CAROTID ENDARTERECTOMY Right 01/2000  . CATARACT EXTRACTION Bilateral   . CERVICAL DISC SURGERY  8/07    . INSERTION PROSTATE RADIATION SEED  2000  . [place] SURGERY  1990s  . NASAL SINUS SURGERY     x 3     Review of Systems As above  Objective:   Physical Exam BP (!) 140/50   Pulse (!) 55   Ht 5\' 11"  (1.803 m)   Wt 166 lb (75.3 kg)   BMI 23.15 kg/m  He is in no acute distress Lungs are clear Heart sounds are normal Abdomen is soft with moderate tenderness and slight guarding in the left lower quadrant. No organomegaly or mass  His affect is appropriate as is his mood he is alert and oriented 3.  Review viewed prior colonoscopy several years ago that showed some colon polyps and hemorrhoids and diverticulosis. Is no recent abdominal pelvic CT scan. Chemistries and CBC in March were normal except for slight elevation BUN.

## 2016-07-03 ENCOUNTER — Ambulatory Visit (INDEPENDENT_AMBULATORY_CARE_PROVIDER_SITE_OTHER)
Admission: RE | Admit: 2016-07-03 | Discharge: 2016-07-03 | Disposition: A | Discharge status: Home / Self Care | Payer: PPO | Source: Ambulatory Visit | Attending: Internal Medicine | Admitting: Internal Medicine

## 2016-07-03 ENCOUNTER — Telehealth: Payer: Self-pay | Admitting: Internal Medicine

## 2016-07-03 DIAGNOSIS — R197 Diarrhea, unspecified: Secondary | ICD-10-CM | POA: Diagnosis not present

## 2016-07-03 DIAGNOSIS — R1032 Left lower quadrant pain: Secondary | ICD-10-CM

## 2016-07-03 DIAGNOSIS — R10814 Left lower quadrant abdominal tenderness: Secondary | ICD-10-CM

## 2016-07-03 DIAGNOSIS — R110 Nausea: Secondary | ICD-10-CM

## 2016-07-03 MED ORDER — METOCLOPRAMIDE HCL 5 MG PO TABS: 5.0000 mg | ORAL_TABLET | Freq: Every day | ORAL | 0 refills | Status: DC

## 2016-07-03 MED ORDER — IOPAMIDOL (ISOVUE-300) INJECTION 61%
100.0000 mL | Freq: Once | INTRAVENOUS | Status: AC | PRN
  Administered 2016-07-03: 100 mL via INTRAVENOUS

## 2016-07-03 MED FILL — METOCLOPRAMIDE 5 MG TABLET: 5 | 30 days supply | Qty: 30 | Fill #0

## 2016-07-03 NOTE — Progress Notes (Signed)
The CT scan does not show any acute problem Colon looks ok He does have calcified blood vessels that supply the intestines and that suggests he could have had low blood flow to the intestine.  A colonoscopy to evaluate this is reasonable but I am not pushing that since his hemoglobin was ok (NL)  Dis he try the reglan at bedtime and did it help?

## 2016-07-03 NOTE — Telephone Encounter (Signed)
Resent the reglan to [facility] and called and canceled it with the mail order pharmacy.  Spoke with Mrs. Saladin and informed her.

## 2016-07-06 ENCOUNTER — Telehealth: Payer: Self-pay | Admitting: Internal Medicine

## 2016-07-06 MED FILL — GABAPENTIN 300 MG CAPSULE: 300 | 90 days supply | Qty: 270 | Fill #1

## 2016-07-06 NOTE — Telephone Encounter (Signed)
Patient's wife notified of the results of CT and labs.  Copy of labs mailed.

## 2016-07-07 ENCOUNTER — Telehealth: Payer: Self-pay | Admitting: *Deleted

## 2016-07-07 NOTE — Telephone Encounter (Signed)
Daughter called stating Dr Carlean Purl had instructed patients wife to call and schedule an appointment with Dr Early due to abdominal pain.  A CT scan had been performed.   The patient was scheduled an appointment and was instructed if symptoms worsened to report to the ED.  Daughter voiced understanding of the instructions.

## 2016-07-09 ENCOUNTER — Telehealth: Payer: Self-pay | Admitting: Internal Medicine

## 2016-07-09 NOTE — Telephone Encounter (Signed)
Labs were not mailed out until late 07/06/16. They should reach her by tomorrow.

## 2016-07-13 ENCOUNTER — Telehealth: Payer: Self-pay | Admitting: Family Medicine

## 2016-07-13 ENCOUNTER — Encounter: Payer: Self-pay | Admitting: Vascular Surgery

## 2016-07-13 MED ORDER — VITAMIN D (ERGOCALCIFEROL) 1.25 MG (50000 UNIT) PO CAPS: 50000.0000 [IU] | ORAL_CAPSULE | ORAL | 8 refills | Status: DC

## 2016-07-13 NOTE — Telephone Encounter (Signed)
Medications sent to pharmacy.  PC

## 2016-07-13 NOTE — Telephone Encounter (Signed)
Caller name: Nirvaan Relation to pt: self  Call back number: [phone] Pharmacy: [facility]   Reason for call: Pt states is needing refill on Vitamin D, Ergocalciferol, (DRISDOL) 50000 units CAPS capsule, pt would like to have 8 refills  since insurance will cover for the 8 (it would be cheaper for pt). Pt is completely out of meds and is needing his refill.  Please advise.

## 2016-07-13 NOTE — Addendum Note (Signed)
Addended by: Magdalene Molly A on: 07/13/2016 04:55 PM   Modules accepted: Orders

## 2016-07-16 ENCOUNTER — Telehealth: Payer: Self-pay | Admitting: Family Medicine

## 2016-07-16 NOTE — Telephone Encounter (Signed)
Called the pharmacy left a detailed message of prescription details.

## 2016-07-16 NOTE — Telephone Encounter (Signed)
Did ok a 90 day refill on vitamin D  #12 with 3 refills.

## 2016-07-16 NOTE — Telephone Encounter (Signed)
[place] okay to lvm   Calling in to get clarity on Vitamin D Rx.

## 2016-07-17 MED FILL — ATORVASTATIN 40 MG TABLET: 40 | 90 days supply | Qty: 90 | Fill #1

## 2016-07-21 ENCOUNTER — Ambulatory Visit: Payer: PPO | Admitting: Vascular Surgery

## 2016-07-22 ENCOUNTER — Encounter: Payer: Self-pay | Admitting: Vascular Surgery

## 2016-07-22 ENCOUNTER — Other Ambulatory Visit: Payer: Self-pay | Admitting: Internal Medicine

## 2016-07-23 ENCOUNTER — Telehealth: Payer: Self-pay | Admitting: Internal Medicine

## 2016-07-23 ENCOUNTER — Other Ambulatory Visit: Payer: Self-pay | Admitting: Internal Medicine

## 2016-07-23 MED ORDER — METHYLDOPA 250 MG PO TABS: ORAL_TABLET | ORAL | 0 refills | Status: DC

## 2016-07-23 MED ORDER — METHYLDOPA 500 MG PO TABS: ORAL_TABLET | ORAL | 2 refills | Status: DC

## 2016-07-23 MED FILL — METHYLDOPA 250 MG TAB: 250 | 7 days supply | Qty: 70 | Fill #0

## 2016-07-23 NOTE — Telephone Encounter (Signed)
°*  STAT* If patient is at the pharmacy, call can be transferred to refill team.   1. Which medications need to be refilled? (please list name of each medication and dose if known) need new prescription for Methyldopa  2. Which pharmacy/location (including street and city if local pharmacy) is medication to be sent to?[facility]  3. Do they need a 30 day or 90 day supply?90-pt's son is in the hospital needs to go out of town today,needs his medicine before he leaves

## 2016-07-23 NOTE — Addendum Note (Signed)
Addended by: Derl Barrow on: 07/23/2016 03:11 PM   Modules accepted: Orders

## 2016-07-23 NOTE — Telephone Encounter (Signed)
SCRIPT SENT  TO [facility] [address]  [place]

## 2016-07-23 NOTE — Telephone Encounter (Signed)
New message     methyldopa (ALDOMET) 250 MG tablet Take 4 tablets by mouth in the morning, 2 tablets by mouth in Mid day and 4 tablets by mouth in the evening.   Pharmacy can not get this medication anymore, they need you to change it to something else or find a pharmacy that is still carrying it

## 2016-07-23 NOTE — Telephone Encounter (Signed)
Called pt and pt stated that his pharmacy [facility] did not have methyldopa 500 mg tablet, they only have methyldopa 250 mg tablet and pt is out of his medication. I sent in a refill for methyldopa 250 mg tablet, taking 4 tablets in the am, 2 tablet in the mid day and 4 tablet in the evening, to equal the dose as prescribed by Dr. Harrington Challenger. I advised the pt that if he has any other problems, questions or concerns to call the office. Pt verbalized understanding.

## 2016-07-27 ENCOUNTER — Telehealth: Payer: Self-pay | Admitting: Internal Medicine

## 2016-07-27 ENCOUNTER — Other Ambulatory Visit: Payer: Self-pay | Admitting: Internal Medicine

## 2016-07-27 MED FILL — AMLODIPINE BESYLATE 5 MG TA: 5 | 30 days supply | Qty: 60 | Fill #0

## 2016-07-27 NOTE — Telephone Encounter (Signed)
New message      *STAT* If patient is at the pharmacy, call can be transferred to refill team.   1. Which medications need to be refilled? (please list name of each medication and dose if known)  amLODipine (NORVASC) 5 MG tablet Take 1 tablet (5 mg total) by mouth 2 (two) times daily. *Please call and schedule a one year follow up appointment*   Aug 27 appt   2. Which pharmacy/location (including street and city if local pharmacy) is medication to be sent to?  [facility]   3. Do they need a 30 day or 90 day supply? [place]

## 2016-07-27 NOTE — Telephone Encounter (Signed)
Patient's wife notified that virus can last for several days.  He has been sick since Friday, diarrhea has pretty much stopped, but he has some nausea. She will continue his zofran as prescribed an they will call back if his symptoms fail to improve in the next few days.

## 2016-07-31 ENCOUNTER — Other Ambulatory Visit: Payer: Self-pay | Admitting: Family Medicine

## 2016-07-31 DIAGNOSIS — I10 Essential (primary) hypertension: Secondary | ICD-10-CM

## 2016-07-31 DIAGNOSIS — R5383 Other fatigue: Secondary | ICD-10-CM

## 2016-07-31 DIAGNOSIS — R531 Weakness: Secondary | ICD-10-CM

## 2016-07-31 NOTE — Telephone Encounter (Signed)
Requesting:   tramadol Contract   03/30/16 UDS    Low risk---next is due on 09/30/16 Last OV    03/30/16 Last Refill    #90 no refill son 06/23/2016  Please Advise

## 2016-08-01 NOTE — Telephone Encounter (Signed)
Reviewed [place] controlled substance registry.  Refills appropriate.  See rx.

## 2016-08-03 ENCOUNTER — Other Ambulatory Visit: Payer: Self-pay | Admitting: Internal Medicine

## 2016-08-03 ENCOUNTER — Encounter: Payer: Self-pay | Admitting: Vascular Surgery

## 2016-08-03 MED FILL — traMADol HCL 50 MG TABS: 50 | 30 days supply | Qty: 90 | Fill #0

## 2016-08-03 MED FILL — CLOPIDOGREL 75 MG TABLET: 75 | 90 days supply | Qty: 90 | Fill #0

## 2016-08-03 NOTE — Telephone Encounter (Signed)
Rx faxed over to [facility] out patient.   PC

## 2016-08-04 ENCOUNTER — Ambulatory Visit: Payer: PPO | Admitting: Vascular Surgery

## 2016-08-10 ENCOUNTER — Encounter: Payer: Self-pay | Admitting: Vascular Surgery

## 2016-08-11 ENCOUNTER — Encounter: Payer: Self-pay | Admitting: Vascular Surgery

## 2016-08-11 ENCOUNTER — Ambulatory Visit (INDEPENDENT_AMBULATORY_CARE_PROVIDER_SITE_OTHER): Payer: PPO | Admitting: Vascular Surgery

## 2016-08-11 VITALS — BP 121/65 | HR 60 | Temp 97.4°F | Resp 20 | Ht 71.0 in | Wt 164.0 lb

## 2016-08-11 DIAGNOSIS — I739 Peripheral vascular disease, unspecified: Secondary | ICD-10-CM

## 2016-08-11 NOTE — Progress Notes (Signed)
HISTORY AND PHYSICAL     CC:  Calcified artery into intestines Requesting Provider:  [name], MD  HPI: This is a 83 y.o. male who sees Dr. Donnetta Hutching yearly for carotid disease.   He had a right carotid endarterectomy February 10, 2010.  He presents today for evaluation after having a CTA of his abdomen.  Pt states that over the past few months, he has been having intermittent diarrhea.  This does not happen every day.  He does have some bloating after he eats a large meal.  He does not feel bloated after every meal.  He has not had any significant weight loss (only 2lbs).  He has had some nausea that is intermittent and was prescribed some reglan, which has helped.   He did undergo a CTA for the above sx and the fact that he and his wife do not have colonoscopy's because she has a hx of perforation after colonoscopy.    The pt does take a statin for cholesterol management.  His is on Plavix/aspirin daily.  He is on a CCB for blood pressure control.   He states that he does have blood in his urine since he has had radiation and radiation seeds placed years ago.    Past Medical History:  Diagnosis Date  . Acute bronchitis 06/10/2015  . Allergic rhinitis   . Anemia   . Asthma   . Carotid stenosis    a. s/p Left CEA 2002;  b. Carotid US 3/16:  patent R CEA, L < 40%  . Chronic pain syndrome    Left shoulder, back  . Diverticulosis   . Dyslipidemia   . Epigastric pain 04/05/2016  . Esophageal stricture    Distal, benign  . GERD (gastroesophageal reflux disease)   . Hemorrhoids   . HTN (hypertension)    Negative renal duplex 07-22-11  . Hx of echocardiogram    a. Echo 10/11: mod LVH, EF 55-60%  . Pneumonia 07-2013  . Prostate cancer ([place]) 2000   Seed XRT  . Spondylosis, cervical, with myelopathy 11/26/2015  . Stroke [facility]) 8/01   right thalamic - on chronic Plavix/ASA    Past Surgical History:  Procedure Laterality Date  . APPENDECTOMY  1953  . CAROTID ENDARTERECTOMY Right  01/2000  . CATARACT EXTRACTION Bilateral   . CERVICAL DISC SURGERY  8/07  . INSERTION PROSTATE RADIATION SEED  2000  . [place] SURGERY  1990s  . NASAL SINUS SURGERY     x 3    Allergies  Allergen Reactions  . Diovan [Valsartan] Other (See Comments)    Elevated potasium hyperkalemia  . Hctz [Hydrochlorothiazide] Other (See Comments)    Swelling and dyspnea   . Oxycodone-Acetaminophen Nausea And Vomiting  . Metformin And Related   . Codeine Nausea Only  . Oxycodone-Acetaminophen Nausea Only    Current Outpatient Prescriptions  Medication Sig Dispense Refill  . albuterol (PROVENTIL HFA;VENTOLIN HFA) 108 (90 Base) MCG/ACT inhaler Inhale 1-2 puffs into the lungs every 6 (six) hours as needed for wheezing or shortness of breath. 1 Inhaler 6  . amLODipine (NORVASC) 5 MG tablet Take 1 tablet (5 mg total) by mouth 2 (two) times daily. *Please call and schedule a one year follow up appointment* 180 tablet 0  . amLODipine (NORVASC) 5 MG tablet Take 1 tablet (5 mg total) by mouth 2 (two) times daily. Please keep follow up office visit to receive further refills. Thanks. 60 tablet 0  . aspirin 325 MG tablet  Take 325 mg by mouth daily.    [name] Kitchen atorvastatin (LIPITOR) 40 MG tablet TAKE 1 TABLET BY MOUTH ONCE DAILY 90 tablet 1  . cetirizine (ZYRTEC) 10 MG tablet Take 10 mg by mouth daily.    . cholecalciferol (VITAMIN D) 1000 UNITS tablet Take 1,000 Units by mouth daily.    . clopidogrel (PLAVIX) 75 MG tablet TAKE 1 TABLET BY MOUTH ONCE DAILY 90 tablet 0  . docusate sodium (COLACE) 100 MG capsule Take 100 mg by mouth 2 (two) times daily.    . fluticasone (FLONASE) 50 MCG/ACT nasal spray PLACE 2 SPRAYS INTO BOTH NOSTRILS DAILY. 16 g 6  . gabapentin (NEURONTIN) 300 MG capsule TAKE 1 CAPSULE BY MOUTH 3 TIMES DAILY. 270 capsule 1  . methyldopa (ALDOMET) 500 MG tablet Take 2 tablets by mouth in the morning, 1tablets by mouth in Mid day and 2 tablets by mouth in the evening. 150 tablet 2  .  metoCLOPramide (REGLAN) 5 MG tablet Take 1 tablet (5 mg total) by mouth at bedtime. 30 tablet 0  . montelukast (SINGULAIR) 10 MG tablet Take 1 tablet (10 mg total) by mouth at bedtime. 90 tablet 0  . omeprazole (PRILOSEC) 40 MG capsule Take 1 capsule (40 mg total) by mouth daily. 90 capsule 1  . ondansetron (ZOFRAN) 4 MG tablet TAKE 1 TABLET BY MOUTH EVERY 8 HOURS AS NEEDED FOR NAUSEA AND VOMITING 30 tablet 1  . phenazopyridine (PYRIDIUM) 100 MG tablet Take 1 tablet (100 mg total) by mouth 3 (three) times daily as needed for pain. 20 tablet 0  . polyethylene glycol (MIRALAX / GLYCOLAX) packet Take 17 g by mouth as needed for mild constipation.     . senna-docusate (SENOKOT-S) 8.6-50 MG per tablet Take 1 tablet by mouth daily as needed for mild constipation.     . traMADol (ULTRAM) 50 MG tablet TAKE 1 TABLET BY MOUTH EVERY 8 HOURS AS NEEDED 90 tablet 0  . triamcinolone cream (KENALOG) 0.5 % Apply 1 application topically as needed (for skin irritation).     . Vitamin D, Ergocalciferol, (DRISDOL) 50000 units CAPS capsule Take 1 capsule (50,000 Units total) by mouth every 7 (seven) days. 4 capsule 8  . Wheat Dextrin (BENEFIBER PO) Take 1 scoop by mouth 2 (two) times daily. Powder- Mix with liquid     No current facility-administered medications for this visit.     Family History  Problem Relation Age of Onset  . Stroke Brother   . Stroke Sister   . Stroke Mother   . Hyperlipidemia Mother   . Hypertension Mother   . Lung disease Father   . Diabetes Son   . Gout Son   . Obesity Sister   . Alcohol abuse Brother   . Cancer Brother        lung, smoker    Social History   Social History  . Marital status: Married    Spouse name: N/A  . Number of children: 1  . Years of education: 10   Occupational History  . Retired    Social History Main Topics  . Smoking status: Former Smoker    Packs/day: 0.25    Years: 35.00    Types: Cigarettes    Quit date: 01/12/1978  . Smokeless tobacco:  Current User    Types: Chew  . Alcohol use No     Comment: Prior alcoholic, sober since 7893  . Drug use: No  . Sexual activity: Not Currently   Other Topics Concern  .  Not on file   Social History Narrative   Lives at home w/ his wife   Right-handed    Caffeine: drinks a lot of coffee per report     REVIEW OF SYSTEMS:   [X]  denotes positive finding, [ ]  denotes negative finding Cardiac  Comments:  Chest pain or chest pressure:    Shortness of breath upon exertion:    Short of breath when lying flat:    Irregular heart rhythm:        Vascular    Pain in calf, thigh, or hip brought on by ambulation:    Pain in feet at night that wakes you up from your sleep:     Blood clot in your veins:    Leg swelling:  x       Pulmonary    Oxygen at home:    Productive cough:     Wheezing:         Neurologic    Sudden weakness in arms or legs:     Sudden numbness in arms or legs:     Sudden onset of difficulty speaking or slurred speech:    Temporary loss of vision in one eye:     Problems with dizziness:  x       Gastrointestinal    Blood in stool:     Vomited blood:         Genitourinary    Burning when urinating:     Blood in urine: x       Psychiatric    Major depression:         Hematologic    Bleeding problems:    Problems with blood clotting too easily:        Skin    Rashes or ulcers:        Constitutional    Fever or chills:      PHYSICAL EXAMINATION:  Vitals:   08/11/16 1404  BP: 121/65  Pulse: 60  Resp: 20  Temp: (!) 97.4 F (36.3 C)   Body mass index is 22.87 kg/m.   Vitals:   08/11/16 1404  Weight: 164 lb (74.4 kg)  Height: 5\' 11"  (1.803 m)     General:  WDWN in NAD; vital signs documented above Gait: Normal HENT: WNL, normocephalic Pulmonary: normal non-labored breathing , without Rales, rhonchi,  wheezing Cardiac: regular HR, without  Murmurs, rubs or gallops; without carotid bruits Abdomen: soft, NT, no masses Skin: without  rashes Vascular Exam/Pulses:  Right Left  Radial 2+ (normal) 2+ (normal)  Ulnar 2+ (normal) 2+ (normal)  Femoral 2+ (normal) 1+ (weak)  DP 2+ (normal) 2+ (normal)  PT Unable to palpate  Unable to palpate    Extremities: without ischemic changes, without Gangrene , without cellulitis; without open wounds;  Musculoskeletal: no muscle wasting or atrophy  Neurologic: A&O X 3;  No focal weakness or paresthesias are detected Psychiatric:  The pt has Normal affect.   Non-Invasive Vascular Imaging:   CTA abdomen/pelvis 07/03/16: IMPRESSION: 1. No CT findings to suggest acute colitis. 2. Significant/advanced atherosclerotic calcifications involving the abdominal aorta and branch vessels with fairly significant disease involving the SMA, branch vessels and IMA. Could not exclude episodes of ischemic colitis based on the amount vascular disease. 3. No abdominal/pelvic mass or adenopathy. 4. Brachytherapy seeds noted the prostate gland. 5. Renal scarring changes but no hydronephrosis.  Pt meds includes: Statin:  Yes.   Beta Blocker:  No. Aspirin:  Yes.   ACEI:  No. ARB:  No. CCB use:  Yes Other Antiplatelet/Anticoagulant:  Yes Plavix   ASSESSMENT/PLAN:: 83 y.o. male here for evaluation for mesenteric ischemia   -after Dr. Donnetta Hutching reviewed pt's CT scan, itdoes reveal some narrowing of the mesenteric vessels, however they are all patent.  His sx of intermittent diarrhea, minimal weight loss and bloating after some meals are not consistent with mesenteric ischemia.  Dr. Donnetta Hutching feels pt's sx are probably not related to the narrowing.   -continue statin/aspirin.   -pt will keep his follow up with Dr. Donnetta Hutching for yearly surviellence for his carotid disease.   -Dr. Donnetta Hutching discussed sx of mesenteric ischemia-pt will contact us if he has any issues.   Leontine Locket, PA-C [facility] [phone]  Clinic MD:  Pt seen and examined with Dr. Donnetta Hutching.     I have examined the  patient, reviewed and agree with above. Reviewed his CT angiographic pictures. Discussed these at length with the patient and his wife present. He does have calcification of the origin and probably moderate narrowing is SMA. He does have a wide patency of his celiac and IMA. Explained that he does not have any relationship to eating and certainly no significant weight loss. I feel that he is at no risk for acute ischemic infarct. They're reassured this discussion will see [place] again on as-needed basis  Curt Jews, MD 08/11/2016 3:05 PM

## 2016-08-14 ENCOUNTER — Other Ambulatory Visit: Payer: Self-pay | Admitting: Internal Medicine

## 2016-08-14 NOTE — Telephone Encounter (Signed)
May I refill Sir? 

## 2016-08-18 MED FILL — METOCLOPRAMIDE 5 MG TABLET: 5 | 30 days supply | Qty: 30 | Fill #0

## 2016-08-18 NOTE — Telephone Encounter (Signed)
Refill x 4 mos

## 2016-08-21 ENCOUNTER — Other Ambulatory Visit: Payer: Self-pay | Admitting: Internal Medicine

## 2016-08-21 MED FILL — AMLODIPINE BESYLATE 5 MG TA: 5 | 15 days supply | Qty: 30 | Fill #0

## 2016-08-31 ENCOUNTER — Other Ambulatory Visit: Payer: Self-pay | Admitting: Family

## 2016-08-31 DIAGNOSIS — R531 Weakness: Secondary | ICD-10-CM

## 2016-08-31 DIAGNOSIS — I10 Essential (primary) hypertension: Secondary | ICD-10-CM

## 2016-08-31 DIAGNOSIS — R5383 Other fatigue: Secondary | ICD-10-CM

## 2016-09-01 NOTE — Telephone Encounter (Signed)
Self.   Refill for Tramadol   Pharmacy: [facility], [place]

## 2016-09-02 ENCOUNTER — Telehealth: Payer: Self-pay | Admitting: Family Medicine

## 2016-09-02 NOTE — Telephone Encounter (Signed)
°  Relation to GM:WNUU Call back number:[phone]  Reason for call:  Patient checking on the status of traMADol (ULTRAM) 50 MG tablet request, please advise

## 2016-09-02 NOTE — Telephone Encounter (Signed)
Pt called in to speak with assist. Pt didn't want to take a detailed message he would just like a call back    CB: [phone]

## 2016-09-03 MED FILL — traMADol HCL 50 MG TABS: 50 | 30 days supply | Qty: 90 | Fill #0

## 2016-09-03 NOTE — Telephone Encounter (Signed)
rx sent to pharmacy  pc

## 2016-09-03 NOTE — Telephone Encounter (Signed)
Requesting:tramadol Contract:yes UDS:low risk nxt scrn: 09/30/16 Last OV:03/30/16 Next OV:10/22/16 Last Refill:08/01/16 #[id number]   Please advise

## 2016-09-03 NOTE — Telephone Encounter (Signed)
Returned his call--he had requested refills, but had gotten already.

## 2016-09-04 ENCOUNTER — Encounter: Payer: Self-pay | Admitting: Internal Medicine

## 2016-09-04 ENCOUNTER — Ambulatory Visit (INDEPENDENT_AMBULATORY_CARE_PROVIDER_SITE_OTHER): Payer: PPO | Admitting: Internal Medicine

## 2016-09-04 ENCOUNTER — Encounter (INDEPENDENT_AMBULATORY_CARE_PROVIDER_SITE_OTHER): Payer: Self-pay

## 2016-09-04 VITALS — BP 108/48 | HR 50 | Ht 71.0 in | Wt 163.6 lb

## 2016-09-04 DIAGNOSIS — I10 Essential (primary) hypertension: Secondary | ICD-10-CM | POA: Diagnosis not present

## 2016-09-04 DIAGNOSIS — I679 Cerebrovascular disease, unspecified: Secondary | ICD-10-CM | POA: Diagnosis not present

## 2016-09-04 DIAGNOSIS — E782 Mixed hyperlipidemia: Secondary | ICD-10-CM

## 2016-09-04 MED ORDER — AMLODIPINE BESYLATE 5 MG PO TABS: 5.0000 mg | ORAL_TABLET | Freq: Two times a day (BID) | ORAL | 1 refills | Status: DC

## 2016-09-04 MED FILL — AMLODIPINE BESYLATE 5 MG TA: 5 | 90 days supply | Qty: 180 | Fill #0

## 2016-09-04 NOTE — Progress Notes (Signed)
Cardiology Office Note   Date:  09/04/2016   ID:  [name], DOB 05-14-1929, MRN [id number]  PCP:  [name], MD  Cardiologist:   [name], MD   F?u of HTN     History of Present Illness: [name] is a 83 y.o. male with a history of HTN, reactive airway dz, prior CVA  CV dz (s/o L CEA), HL   I saw him in April 2017  The pt denies CP  Breathing is OK  No dizziness Says he is tired more  Did get on riding mower yesterday  No problem     Outpatient Medications Prior to Visit  Medication Sig Dispense Refill  . albuterol (PROVENTIL HFA;VENTOLIN HFA) 108 (90 Base) MCG/ACT inhaler Inhale 1-2 puffs into the lungs every 6 (six) hours as needed for wheezing or shortness of breath. 1 Inhaler 6  . amLODipine (NORVASC) 5 MG tablet Take 1 tablet (5 mg total) by mouth 2 (two) times daily. *Please call and schedule a one year follow up appointment* 180 tablet 0  . aspirin 325 MG tablet Take 325 mg by mouth daily.    [name] Kitchen atorvastatin (LIPITOR) 40 MG tablet TAKE 1 TABLET BY MOUTH ONCE DAILY 90 tablet 1  . cetirizine (ZYRTEC) 10 MG tablet Take 10 mg by mouth daily.    . cholecalciferol (VITAMIN D) 1000 UNITS tablet Take 1,000 Units by mouth daily.    . clopidogrel (PLAVIX) 75 MG tablet TAKE 1 TABLET BY MOUTH ONCE DAILY 90 tablet 0  . docusate sodium (COLACE) 100 MG capsule Take 100 mg by mouth 2 (two) times daily.    . fluticasone (FLONASE) 50 MCG/ACT nasal spray PLACE 2 SPRAYS INTO BOTH NOSTRILS DAILY. 16 g 6  . gabapentin (NEURONTIN) 300 MG capsule TAKE 1 CAPSULE BY MOUTH 3 TIMES DAILY. 270 capsule 1  . methyldopa (ALDOMET) 500 MG tablet Take 2 tablets by mouth in the morning, 1tablets by mouth in Mid day and 2 tablets by mouth in the evening. 150 tablet 2  . metoCLOPramide (REGLAN) 5 MG tablet TAKE 1 TABLET BY MOUTH AT BEDTIME 30 tablet 3  . montelukast (SINGULAIR) 10 MG tablet Take 1 tablet (10 mg total) by mouth at bedtime. 90 tablet 0  . omeprazole (PRILOSEC) 40 MG capsule Take  1 capsule (40 mg total) by mouth daily. 90 capsule 1  . ondansetron (ZOFRAN) 4 MG tablet TAKE 1 TABLET BY MOUTH EVERY 8 HOURS AS NEEDED FOR NAUSEA AND VOMITING 30 tablet 1  . phenazopyridine (PYRIDIUM) 100 MG tablet Take 1 tablet (100 mg total) by mouth 3 (three) times daily as needed for pain. 20 tablet 0  . polyethylene glycol (MIRALAX / GLYCOLAX) packet Take 17 g by mouth as needed for mild constipation.     . senna-docusate (SENOKOT-S) 8.6-50 MG per tablet Take 1 tablet by mouth daily as needed for mild constipation.     . traMADol (ULTRAM) 50 MG tablet TAKE 1 TABLET BY MOUTH EVERY 8 HOURS AS NEEDED 90 tablet 0  . triamcinolone cream (KENALOG) 0.5 % Apply 1 application topically as needed (for skin irritation).     . Vitamin D, Ergocalciferol, (DRISDOL) 50000 units CAPS capsule Take 1 capsule (50,000 Units total) by mouth every 7 (seven) days. 4 capsule 8  . Wheat Dextrin (BENEFIBER PO) Take 1 scoop by mouth 2 (two) times daily. Powder- Mix with liquid    . amLODipine (NORVASC) 5 MG tablet TAKE 1 TABLET BY MOUTH TWICE DAILY.  PLEASE KEEP FOLLOW UP APPOINTMENT TO RECEIVE FURTHER REFILLS. (Patient not taking: Reported on 09/04/2016) 30 tablet 0   No facility-administered medications prior to visit.      Allergies:   Diovan [valsartan]; Hctz [hydrochlorothiazide]; Oxycodone-acetaminophen; Metformin and related; Codeine; and Oxycodone-acetaminophen   Past Medical History:  Diagnosis Date  . Acute bronchitis 06/10/2015  . Allergic rhinitis   . Anemia   . Asthma   . Carotid stenosis    a. s/p Left CEA 2002;  b. Carotid US 3/16:  patent R CEA, L < 40%  . Chronic pain syndrome    Left shoulder, back  . Diverticulosis   . Dyslipidemia   . Epigastric pain 04/05/2016  . Esophageal stricture    Distal, benign  . GERD (gastroesophageal reflux disease)   . Hemorrhoids   . HTN (hypertension)    Negative renal duplex 07-22-11  . Hx of echocardiogram    a. Echo 10/11: mod LVH, EF 55-60%  .  Pneumonia 07-2013  . Prostate cancer ([place]) 2000   Seed XRT  . Spondylosis, cervical, with myelopathy 11/26/2015  . Stroke [facility]) 8/01   right thalamic - on chronic Plavix/ASA    Past Surgical History:  Procedure Laterality Date  . APPENDECTOMY  1953  . CAROTID ENDARTERECTOMY Right 01/2000  . CATARACT EXTRACTION Bilateral   . CERVICAL DISC SURGERY  8/07  . INSERTION PROSTATE RADIATION SEED  2000  . [place] SURGERY  1990s  . NASAL SINUS SURGERY     x 3     Social History:  The patient  reports that he quit smoking about 38 years ago. His smoking use included Cigarettes. He has a 8.75 pack-year smoking history. His smokeless tobacco use includes Chew. He reports that he does not drink alcohol or use drugs.   Family History:  The patient's family history includes Alcohol abuse in his brother; Cancer in his brother; Diabetes in his son; Gout in his son; Hyperlipidemia in his mother; Hypertension in his mother; Lung disease in his father; Obesity in his sister; Stroke in his brother, mother, and sister.    ROS:  Please see the history of present illness. All other systems are reviewed and  Negative to the above problem except as noted.    PHYSICAL EXAM: VS:  BP (!) 108/48   Pulse (!) 50   Ht 5\' 11"  (1.803 m)   Wt 163 lb 9.6 oz (74.2 kg)   SpO2 97%   BMI 22.82 kg/m   GEN: Well nourished, well developed, in no acute distress HEENT: normal Neck: no JVD, carotid bruits, or masses Cardiac: RRR; no murmurs, rubs, or gallops,  R ankle sl swollen (hit dishwasher) Respiratory:  clear to auscultation bilaterally, normal work of breathing GI: soft, nontender, nondistended, + BS  No hepatomegaly  MS: no deformity Moving all extremities   Skin: warm and dry, no rash  Scab on L shin   Neuro:  Strength and sensation are intact Psych: euthymic mood, full affect   EKG:  EKG is ordered today.    SB with with PACs    55 bpm  Septal MI  LAFB  Nonspecific ST changes     Lipid Panel      Component Value Date/Time   CHOL 111 03/30/2016 1554   TRIG 116.0 03/30/2016 1554   HDL 42.20 03/30/2016 1554   CHOLHDL 3 03/30/2016 1554   VLDL 23.2 03/30/2016 1554   LDLCALC 45 03/30/2016 1554      Wt Readings from  Last 3 Encounters:  09/04/16 163 lb 9.6 oz (74.2 kg)  08/11/16 164 lb (74.4 kg)  07/02/16 166 lb (75.3 kg)      ASSESSMENT AND PLAN 1   HTN  BP is better  It was very difficult to control in the past  Denies dizziness Keep on same regimen  If methyldopa becomes diffiuclt to get will need to change to another agent  2  HL  Continue lipitor  Good control   4  CV dz  Followede by T Early  CV scan looked good in March    5  Bradycardia  Unchanged  Asymptomatic  Follow   Signed, [name], MD  09/04/2016 3:24 PM    [facility] [place], [place], [place]  [address] Phone: [phone]; Fax: [phone]

## 2016-09-04 NOTE — Patient Instructions (Signed)
Your physician recommends that you continue on your current medications as directed. Please refer to the Current Medication list given to you today. Your physician wants you to follow-up in: April, 2018 with Dr. Harrington Challenger.  You will receive a reminder letter in the mail two months in advance. If you don't receive a letter, please call our office to schedule the follow-up appointment.

## 2016-09-07 ENCOUNTER — Ambulatory Visit: Payer: PPO | Admitting: Internal Medicine

## 2016-09-08 ENCOUNTER — Telehealth: Payer: Self-pay | Admitting: Internal Medicine

## 2016-09-08 NOTE — Telephone Encounter (Signed)
Spoke with wife, DPR on file.  She states pt did not get AVS prior to leaving appt on Friday.  They would like a copy mailed to them so they can have it for his records.  Verified address and advised I will place a copy in the mail.  Wife appreciative for call.

## 2016-09-08 NOTE — Telephone Encounter (Signed)
New message    Pt is calling asking for a call back. She said they did not get the paper about the appt when they were here on Friday. She is asking if she can have one sent to her. Please call.

## 2016-09-21 MED FILL — METOCLOPRAMIDE 5 MG TABLET: 5 | 30 days supply | Qty: 30 | Fill #1

## 2016-09-25 ENCOUNTER — Other Ambulatory Visit: Payer: Self-pay

## 2016-09-25 MED ORDER — METHYLDOPA 500 MG PO TABS: ORAL_TABLET | ORAL | 3 refills | Status: DC

## 2016-10-05 ENCOUNTER — Telehealth: Payer: Self-pay | Admitting: Family Medicine

## 2016-10-05 ENCOUNTER — Other Ambulatory Visit: Payer: Self-pay | Admitting: Family Medicine

## 2016-10-05 DIAGNOSIS — R531 Weakness: Secondary | ICD-10-CM

## 2016-10-05 DIAGNOSIS — I10 Essential (primary) hypertension: Secondary | ICD-10-CM

## 2016-10-05 DIAGNOSIS — R5383 Other fatigue: Secondary | ICD-10-CM

## 2016-10-05 NOTE — Telephone Encounter (Signed)
Relation to AC:QPEA Call back number:[phone]  Reason for call:  Patient requesting a refill traMADol (ULTRAM) 50 MG tablet

## 2016-10-05 NOTE — Telephone Encounter (Signed)
Pt called in he said that his back is hurting really bad and would like a refill on medication. Pt says that he is completely out.   Please assist further.

## 2016-10-05 NOTE — Telephone Encounter (Signed)
Requesting: Tramadol Contract: Yes UDS: 3.19.18 Low-Risk next screen 9.19.18 Last OV: 3.19.18 Next OV: 10.11.18 Last Refill: 8.23.18   Please advise

## 2016-10-06 MED FILL — traMADol HCL 50 MG TABS: 50 | 30 days supply | Qty: 90 | Fill #0

## 2016-10-06 NOTE — Telephone Encounter (Signed)
Medication sent to pharmacy.  Patient mad aware.  pc

## 2016-10-06 NOTE — Telephone Encounter (Signed)
rx signed by PCP will contact patient and let him know it has been faxed.    pc

## 2016-10-14 ENCOUNTER — Other Ambulatory Visit: Payer: Self-pay

## 2016-10-15 ENCOUNTER — Other Ambulatory Visit: Payer: Self-pay | Admitting: Family Medicine

## 2016-10-15 DIAGNOSIS — E559 Vitamin D deficiency, unspecified: Secondary | ICD-10-CM

## 2016-10-15 DIAGNOSIS — I6350 Cerebral infarction due to unspecified occlusion or stenosis of unspecified cerebral artery: Secondary | ICD-10-CM

## 2016-10-15 DIAGNOSIS — I10 Essential (primary) hypertension: Secondary | ICD-10-CM

## 2016-10-15 DIAGNOSIS — E782 Mixed hyperlipidemia: Secondary | ICD-10-CM

## 2016-10-16 MED FILL — ATORVASTATIN 40 MG TABLET: 40 | 90 days supply | Qty: 90 | Fill #0

## 2016-10-21 MED FILL — METOCLOPRAMIDE 5 MG TABLET: 5 | 30 days supply | Qty: 30 | Fill #2

## 2016-10-22 ENCOUNTER — Other Ambulatory Visit: Payer: Self-pay | Admitting: Internal Medicine

## 2016-10-22 ENCOUNTER — Encounter: Payer: PPO | Admitting: Family Medicine

## 2016-10-22 DIAGNOSIS — M545 Low back pain: Secondary | ICD-10-CM | POA: Diagnosis not present

## 2016-10-22 DIAGNOSIS — G8929 Other chronic pain: Secondary | ICD-10-CM | POA: Diagnosis not present

## 2016-10-22 MED FILL — METAXALONE 800 MG TABLET: 800 | 30 days supply | Qty: 30 | Fill #0

## 2016-11-03 ENCOUNTER — Other Ambulatory Visit: Payer: Self-pay | Admitting: Family Medicine

## 2016-11-03 DIAGNOSIS — I10 Essential (primary) hypertension: Secondary | ICD-10-CM

## 2016-11-03 DIAGNOSIS — R531 Weakness: Secondary | ICD-10-CM

## 2016-11-03 DIAGNOSIS — R5383 Other fatigue: Secondary | ICD-10-CM

## 2016-11-03 NOTE — Telephone Encounter (Signed)
Requesting:tramadol Contract:yes UDS:low risk nxt scrn 09/30/16 Last OV:03/30/16 Next OV:12/01/16 Last Refill:#[id number]   Please advise

## 2016-11-04 ENCOUNTER — Other Ambulatory Visit: Payer: Self-pay | Admitting: Family Medicine

## 2016-11-04 DIAGNOSIS — R531 Weakness: Secondary | ICD-10-CM

## 2016-11-04 DIAGNOSIS — R5383 Other fatigue: Secondary | ICD-10-CM

## 2016-11-04 DIAGNOSIS — I10 Essential (primary) hypertension: Secondary | ICD-10-CM

## 2016-11-04 NOTE — Telephone Encounter (Signed)
Patient is needing this refill, please advise

## 2016-11-05 MED ORDER — TRAMADOL HCL 50 MG PO TABS: 50.0000 mg | ORAL_TABLET | Freq: Three times a day (TID) | ORAL | 0 refills | Status: DC | PRN

## 2016-11-05 MED FILL — CLOPIDOGREL 75 MG TABLET: 75 | 90 days supply | Qty: 90 | Fill #0

## 2016-11-05 MED FILL — traMADol HCL 50 MG TABS: 50 | 30 days supply | Qty: 90 | Fill #0

## 2016-11-05 NOTE — Telephone Encounter (Signed)
rx faxed

## 2016-11-05 NOTE — Addendum Note (Signed)
Addended by: Magdalene Molly A on: 11/05/2016 09:43 AM   Modules accepted: Orders

## 2016-11-05 NOTE — Telephone Encounter (Signed)
Med refilled.

## 2016-11-05 NOTE — Telephone Encounter (Signed)
rx faxed patient made aware

## 2016-11-19 MED FILL — METOCLOPRAMIDE 5 MG TABLET: 5 | 30 days supply | Qty: 30 | Fill #3

## 2016-11-25 ENCOUNTER — Other Ambulatory Visit: Payer: Self-pay | Admitting: Family Medicine

## 2016-11-25 DIAGNOSIS — I6350 Cerebral infarction due to unspecified occlusion or stenosis of unspecified cerebral artery: Secondary | ICD-10-CM

## 2016-11-25 DIAGNOSIS — I10 Essential (primary) hypertension: Secondary | ICD-10-CM

## 2016-11-25 DIAGNOSIS — E559 Vitamin D deficiency, unspecified: Secondary | ICD-10-CM

## 2016-11-25 DIAGNOSIS — E782 Mixed hyperlipidemia: Secondary | ICD-10-CM

## 2016-12-01 ENCOUNTER — Encounter: Payer: Self-pay | Admitting: Family Medicine

## 2016-12-01 ENCOUNTER — Ambulatory Visit (INDEPENDENT_AMBULATORY_CARE_PROVIDER_SITE_OTHER): Payer: PPO | Admitting: Family Medicine

## 2016-12-01 VITALS — BP 137/63 | HR 63 | Temp 98.1°F | Resp 16 | Ht 70.87 in | Wt 168.8 lb

## 2016-12-01 DIAGNOSIS — E782 Mixed hyperlipidemia: Secondary | ICD-10-CM

## 2016-12-01 DIAGNOSIS — Z8673 Personal history of transient ischemic attack (TIA), and cerebral infarction without residual deficits: Secondary | ICD-10-CM | POA: Diagnosis not present

## 2016-12-01 DIAGNOSIS — Z79899 Other long term (current) drug therapy: Secondary | ICD-10-CM | POA: Diagnosis not present

## 2016-12-01 DIAGNOSIS — R531 Weakness: Secondary | ICD-10-CM

## 2016-12-01 DIAGNOSIS — R300 Dysuria: Secondary | ICD-10-CM | POA: Diagnosis not present

## 2016-12-01 DIAGNOSIS — M47816 Spondylosis without myelopathy or radiculopathy, lumbar region: Secondary | ICD-10-CM | POA: Diagnosis not present

## 2016-12-01 DIAGNOSIS — I10 Essential (primary) hypertension: Secondary | ICD-10-CM

## 2016-12-01 DIAGNOSIS — Z0001 Encounter for general adult medical examination with abnormal findings: Secondary | ICD-10-CM | POA: Diagnosis not present

## 2016-12-01 DIAGNOSIS — F0631 Mood disorder due to known physiological condition with depressive features: Secondary | ICD-10-CM | POA: Diagnosis not present

## 2016-12-01 DIAGNOSIS — Z0000 Encounter for general adult medical examination without abnormal findings: Secondary | ICD-10-CM

## 2016-12-01 DIAGNOSIS — I6350 Cerebral infarction due to unspecified occlusion or stenosis of unspecified cerebral artery: Secondary | ICD-10-CM

## 2016-12-01 DIAGNOSIS — R739 Hyperglycemia, unspecified: Secondary | ICD-10-CM | POA: Diagnosis not present

## 2016-12-01 DIAGNOSIS — E559 Vitamin D deficiency, unspecified: Secondary | ICD-10-CM

## 2016-12-01 DIAGNOSIS — R5383 Other fatigue: Secondary | ICD-10-CM

## 2016-12-01 MED ORDER — GABAPENTIN 300 MG PO CAPS: 300.0000 mg | ORAL_CAPSULE | Freq: Three times a day (TID) | ORAL | 1 refills | Status: DC

## 2016-12-01 MED ORDER — FLUOXETINE HCL 10 MG PO TABS: 10.0000 mg | ORAL_TABLET | Freq: Every day | ORAL | 3 refills | Status: DC

## 2016-12-01 MED ORDER — TRAMADOL HCL 50 MG PO TABS: 50.0000 mg | ORAL_TABLET | Freq: Three times a day (TID) | ORAL | 0 refills | Status: DC | PRN

## 2016-12-01 NOTE — Progress Notes (Signed)
Subjective:  I acted as a Education administrator for [employer]. [name], [place]   Patient ID: [name], male    DOB: 1929/05/16, 83 y.o.   MRN: [id number]  Chief Complaint  Patient presents with  . Annual Exam    HPI  Patient is in today for annual exam and follow up on chronic medical concerns including hypertension, hyperlipidemia, cerebrovascular disease and headaches. Is noting increased urinary frequency and dysuria this week. Has a history of incontinence and hematuria since his radiation for prostate cancer. No fevers or chills. Has ongoing back pain and malaise. Is very low energy and sleeping a great deal. Admits his mood is low since his health has worsened. Pain depresses him as well. No recent falls or trauma. No recent febrile illness or hospitalizations. Denies CP/palp/SOB/HA/congestion/fevers/GI c/o. Taking meds as prescribed  Patient Care Team: [name], MD as PCP - General (Family Medicine) Fay Records, MD (Cardiology) Early, Arvilla Meres, MD (Vascular Surgery) Katy Apo, MD (Ophthalmology) Harriett Sine, MD as Consulting Physician (Dermatology)   Past Medical History:  Diagnosis Date  . Acute bronchitis 06/10/2015  . Allergic rhinitis   . Anemia   . Asthma   . Carotid stenosis    a. s/p Left CEA 2002;  b. Carotid US 3/16:  patent R CEA, L < 40%  . Chronic pain syndrome    Left shoulder, back  . Diverticulosis   . Dyslipidemia   . Epigastric pain 04/05/2016  . Esophageal stricture    Distal, benign  . GERD (gastroesophageal reflux disease)   . Hemorrhoids   . HTN (hypertension)    Negative renal duplex 07-22-11  . Hx of echocardiogram    a. Echo 10/11: mod LVH, EF 55-60%  . Pneumonia 07-2013  . Prostate cancer ([place]) 2000   Seed XRT  . Spondylosis, cervical, with myelopathy 11/26/2015  . Stroke [facility]) 8/01   right thalamic - on chronic Plavix/ASA    Past Surgical History:  Procedure Laterality Date  . APPENDECTOMY  1953  . CAROTID ENDARTERECTOMY Right  01/2000  . CATARACT EXTRACTION Bilateral   . CERVICAL DISC SURGERY  8/07  . INSERTION PROSTATE RADIATION SEED  2000  . [place] SURGERY  1990s  . NASAL SINUS SURGERY     x 3    Family History  Problem Relation Age of Onset  . Stroke Brother   . Stroke Sister   . Stroke Mother   . Hyperlipidemia Mother   . Hypertension Mother   . Lung disease Father   . Diabetes Son   . Gout Son   . Obesity Sister   . Alcohol abuse Brother   . Cancer Brother        lung, smoker    Social History   Socioeconomic History  . Marital status: Married    Spouse name: Not on file  . Number of children: 1  . Years of education: 84  . Highest education level: Not on file  Social Needs  . Financial resource strain: Not on file  . Food insecurity - worry: Not on file  . Food insecurity - inability: Not on file  . Transportation needs - medical: Not on file  . Transportation needs - non-medical: Not on file  Occupational History  . Occupation: Retired  Tobacco Use  . Smoking status: Former Smoker    Packs/day: 0.25    Years: 35.00    Pack years: 8.75    Types: Cigarettes    Last attempt  to quit: 01/12/1978    Years since quitting: 38.9  . Smokeless tobacco: Current User    Types: Chew  Substance and Sexual Activity  . Alcohol use: No    Comment: Prior alcoholic, sober since 6073  . Drug use: No  . Sexual activity: Not Currently  Other Topics Concern  . Not on file  Social History Narrative   Lives at home w/ his wife   Right-handed    Caffeine: drinks a lot of coffee per report    Outpatient Medications Prior to Visit  Medication Sig Dispense Refill  . albuterol (PROVENTIL HFA;VENTOLIN HFA) 108 (90 Base) MCG/ACT inhaler Inhale 1-2 puffs into the lungs every 6 (six) hours as needed for wheezing or shortness of breath. 1 Inhaler 6  . amLODipine (NORVASC) 5 MG tablet Take 1 tablet (5 mg total) by mouth 2 (two) times daily. 180 tablet 1  . aspirin 325 MG tablet Take 325 mg by mouth  daily.    [name] Kitchen atorvastatin (LIPITOR) 40 MG tablet TAKE 1 TABLET BY MOUTH ONCE DAILY 90 tablet 0  . cetirizine (ZYRTEC) 10 MG tablet Take 10 mg by mouth daily.    . cholecalciferol (VITAMIN D) 1000 UNITS tablet Take 1,000 Units by mouth daily.    . clopidogrel (PLAVIX) 75 MG tablet TAKE 1 TABLET BY MOUTH ONCE DAILY 90 tablet 3  . docusate sodium (COLACE) 100 MG capsule Take 100 mg by mouth 2 (two) times daily.    . fluticasone (FLONASE) 50 MCG/ACT nasal spray PLACE 2 SPRAYS INTO BOTH NOSTRILS DAILY. 16 g 6  . methyldopa (ALDOMET) 500 MG tablet Take 2 tablets by mouth in the morning, 1 tablet by mouth in Mid day and 2 tablets by mouth in the evening. 450 tablet 3  . metoCLOPramide (REGLAN) 5 MG tablet TAKE 1 TABLET BY MOUTH AT BEDTIME 30 tablet 3  . montelukast (SINGULAIR) 10 MG tablet Take 1 tablet (10 mg total) by mouth at bedtime. 90 tablet 0  . omeprazole (PRILOSEC) 40 MG capsule Take 1 capsule (40 mg total) by mouth daily. 90 capsule 1  . ondansetron (ZOFRAN) 4 MG tablet TAKE 1 TABLET BY MOUTH EVERY 8 HOURS AS NEEDED FOR NAUSEA AND VOMITING 30 tablet 1  . phenazopyridine (PYRIDIUM) 100 MG tablet Take 1 tablet (100 mg total) by mouth 3 (three) times daily as needed for pain. 20 tablet 0  . polyethylene glycol (MIRALAX / GLYCOLAX) packet Take 17 g by mouth as needed for mild constipation.     . senna-docusate (SENOKOT-S) 8.6-50 MG per tablet Take 1 tablet by mouth daily as needed for mild constipation.     . triamcinolone cream (KENALOG) 0.5 % Apply 1 application topically as needed (for skin irritation).     . Vitamin D, Ergocalciferol, (DRISDOL) 50000 units CAPS capsule Take 1 capsule (50,000 Units total) by mouth every 7 (seven) days. 4 capsule 8  . Wheat Dextrin (BENEFIBER PO) Take 1 scoop by mouth 2 (two) times daily. Powder- Mix with liquid    . gabapentin (NEURONTIN) 300 MG capsule TAKE 1 CAPSULE BY MOUTH 3 TIMES DAILY. 270 capsule 1  . traMADol (ULTRAM) 50 MG tablet Take 1 tablet (50 mg  total) by mouth every 8 (eight) hours as needed. 90 tablet 0   No facility-administered medications prior to visit.     Allergies  Allergen Reactions  . Diovan [Valsartan] Other (See Comments)    Elevated potasium hyperkalemia  . Hctz [Hydrochlorothiazide] Other (See Comments)    Swelling  and dyspnea   . Oxycodone-Acetaminophen Nausea And Vomiting  . Metformin And Related   . Codeine Nausea Only  . Oxycodone-Acetaminophen Nausea Only    Review of Systems  Constitutional: Positive for malaise/fatigue. Negative for fever.  HENT: Negative for congestion.   Respiratory: Negative for cough and shortness of breath.   Cardiovascular: Negative for chest pain and palpitations.  Gastrointestinal: Negative for vomiting.  Genitourinary: Positive for dysuria and frequency.  Musculoskeletal: Positive for back pain.  Skin: Negative for rash.  Neurological: Positive for weakness. Negative for loss of consciousness and headaches.  Psychiatric/Behavioral: Positive for depression.       Objective:    Physical Exam  Constitutional: He is oriented to person, place, and time. He appears well-developed and well-nourished. No distress.  HENT:  Head: Normocephalic and atraumatic.  Eyes: Conjunctivae are normal.  Neck: Normal range of motion. No thyromegaly present.  Cardiovascular: Normal rate and regular rhythm.  Pulmonary/Chest: Effort normal and breath sounds normal. He has no wheezes.  Abdominal: Soft. Bowel sounds are normal. There is no tenderness.  Musculoskeletal: Normal range of motion. He exhibits no edema or deformity.  Neurological: He is alert and oriented to person, place, and time.  Skin: Skin is warm and dry. He is not diaphoretic.  Psychiatric: He has a normal mood and affect.    BP 137/63 (BP Location: Right Arm, Patient Position: Sitting, Cuff Size: Small)   Pulse 63   Temp 98.1 F (36.7 C) (Oral)   Resp 16   Ht 5' 10.87" (1.8 m)   Wt 168 lb 12.8 oz (76.6 kg)   SpO2  98%   BMI 23.63 kg/m  Wt Readings from Last 3 Encounters:  12/01/16 168 lb 12.8 oz (76.6 kg)  09/04/16 163 lb 9.6 oz (74.2 kg)  08/11/16 164 lb (74.4 kg)   BP Readings from Last 3 Encounters:  12/01/16 137/63  09/04/16 (!) 108/48  08/11/16 121/65     Immunization History  Administered Date(s) Administered  . Influenza Split 09/12/2012  . Influenza Whole 11/04/2006, 10/11/2008, 09/24/2011  . Influenza, High Dose Seasonal PF 10/08/2015  . Influenza-Unspecified 10/05/2013, 10/13/2014  . Pneumococcal Conjugate-13 10/05/2013  . Pneumococcal Polysaccharide-23 01/13/2003, 12/10/2014  . Tetanus 12/12/2013  . Zoster 11/24/2011    Health Maintenance  Topic Date Due  . Samul Dada  12/13/2023  . INFLUENZA VACCINE  Completed  . PNA vac Low Risk Adult  Completed    Lab Results  Component Value Date   WBC 7.2 07/02/2016   HGB 13.1 07/02/2016   HCT 38.0 (L) 07/02/2016   PLT 238.0 07/02/2016   GLUCOSE 95 07/02/2016   CHOL 111 03/30/2016   TRIG 116.0 03/30/2016   HDL 42.20 03/30/2016   LDLCALC 45 03/30/2016   ALT 8 03/30/2016   AST 12 03/30/2016   NA 138 07/02/2016   K 4.6 07/02/2016   CL 106 07/02/2016   CREATININE 0.99 07/02/2016   BUN 16 07/02/2016   CO2 28 07/02/2016   TSH 2.46 03/30/2016   PSA 0.02 (L) 12/11/2008   INR 0.97 02/06/2010   HGBA1C 5.7 03/30/2016    Lab Results  Component Value Date   TSH 2.46 03/30/2016   Lab Results  Component Value Date   WBC 7.2 07/02/2016   HGB 13.1 07/02/2016   HCT 38.0 (L) 07/02/2016   MCV 90.4 07/02/2016   PLT 238.0 07/02/2016   Lab Results  Component Value Date   NA 138 07/02/2016   K 4.6 07/02/2016   CO2 28  07/02/2016   GLUCOSE 95 07/02/2016   BUN 16 07/02/2016   CREATININE 0.99 07/02/2016   BILITOT 0.4 03/30/2016   ALKPHOS 52 03/30/2016   AST 12 03/30/2016   ALT 8 03/30/2016   PROT 6.3 03/30/2016   ALBUMIN 4.0 03/30/2016   CALCIUM 8.9 07/02/2016   ANIONGAP 11 08/11/2013   GFR 76.39 07/02/2016   Lab  Results  Component Value Date   CHOL 111 03/30/2016   Lab Results  Component Value Date   HDL 42.20 03/30/2016   Lab Results  Component Value Date   LDLCALC 45 03/30/2016   Lab Results  Component Value Date   TRIG 116.0 03/30/2016   Lab Results  Component Value Date   CHOLHDL 3 03/30/2016   Lab Results  Component Value Date   HGBA1C 5.7 03/30/2016         Assessment & Plan:   Problem List Items Addressed This Visit    Vitamin D deficiency    Taking vitamin D 1000 IU daily and the 50000 IU weekly      Relevant Orders   VITAMIN D 25 Hydroxy (Vit-D Deficiency, Fractures)   HLD (hyperlipidemia)    Encouraged heart healthy diet, increase exercise, avoid trans fats, consider a . Atorvastatinkrill oil cap daily      Relevant Orders   Lipid panel   HYPERTENSION, BENIGN ESSENTIAL    Well controlled, no changes to meds. Encouraged heart healthy diet such as the DASH diet and exercise as tolerated.       Cerebral artery occlusion with cerebral infarction ([place])    No new concerns or symptoms      Hyperglycemia    hgba1c acceptable, minimize simple carbs. Increase exercise as tolerated.       Relevant Orders   Hemoglobin A1c   Dysuria    Urine culture negative, increase hydration, report worsening symptoms      Relevant Orders   Urine Culture (Completed)   Urinalysis   Weakness    Encouraged adequate sleep and hydration, small frequent meals with lean proteins and minimal complex carbs. Increase activity level as tolerated.      Depression due to physical illness    As his health and mobility have declined his mood and energy level have also declined. He is agreeable to Fluoxetine 10 mg daily to try. reasses in 2-3 months.       Relevant Medications   FLUoxetine (PROZAC) 10 MG tablet   Preventative health care    Patient encouraged to maintain heart healthy diet, regular exercise, adequate sleep. Consider daily probiotics. Take medications as prescribed.          Other Visit Diagnoses    Other fatigue       Hyperlipidemia, mixed       Essential hypertension       Relevant Orders   CBC   Comprehensive metabolic panel   High risk medication use       Relevant Orders   Pain Mgmt, Profile 8 w/Conf, U      I have changed Elyn Aquas. Jandreau's gabapentin. I am also having him start on FLUoxetine. Additionally, I am having him maintain his polyethylene glycol, senna-docusate, docusate sodium, triamcinolone cream, Wheat Dextrin (BENEFIBER PO), cetirizine, aspirin, cholecalciferol, omeprazole, phenazopyridine, albuterol, montelukast, fluticasone, ondansetron, Vitamin D (Ergocalciferol), metoCLOPramide, amLODipine, methyldopa, atorvastatin, clopidogrel, and traMADol.  Meds ordered this encounter  Medications  . traMADol (ULTRAM) 50 MG tablet    Sig: Take 1 tablet (50 mg total) by mouth every  8 (eight) hours as needed.    Dispense:  90 tablet    Refill:  0  . gabapentin (NEURONTIN) 300 MG capsule    Sig: Take 1 capsule (300 mg total) by mouth 3 (three) times daily.    Dispense:  270 capsule    Refill:  1  . FLUoxetine (PROZAC) 10 MG tablet    Sig: Take 1 tablet (10 mg total) by mouth daily.    Dispense:  30 tablet    Refill:  3    CMA served as scribe during this visit. History, Physical and Plan performed by medical provider. Documentation and orders reviewed and attested to.  Penni Homans, MD

## 2016-12-01 NOTE — Assessment & Plan Note (Signed)
hgba1c acceptable, minimize simple carbs. Increase exercise as tolerated.  

## 2016-12-01 NOTE — Patient Instructions (Signed)
Encouraged increased hydration and fiber in diet. Daily probiotics. If bowels not moving can use MOM 2 tbls po in 4 oz of warm prune juice by mouth every 2-3 days. If no results then repeat in 4 hours with  Dulcolax suppository pr, may repeat again in 4 more hours as needed. Seek care if symptoms worsen. Consider daily Miralax and/or Dulcolax if symptoms persist.  Can continue the stool softener Miralax and Benefiber  64 oz of clear fluids. Preventive Care 4 Years and Older, Male Preventive care refers to lifestyle choices and visits with your health care provider that can promote health and wellness. What does preventive care include?  A yearly physical exam. This is also called an annual well check.  Dental exams once or twice a year.  Routine eye exams. Ask your health care provider how often you should have your eyes checked.  Personal lifestyle choices, including: ? Daily care of your teeth and gums. ? Regular physical activity. ? Eating a healthy diet. ? Avoiding tobacco and drug use. ? Limiting alcohol use. ? Practicing safe sex. ? Taking low doses of aspirin every day. ? Taking vitamin and mineral supplements as recommended by your health care provider. What happens during an annual well check? The services and screenings done by your health care provider during your annual well check will depend on your age, overall health, lifestyle risk factors, and family history of disease. Counseling Your health care provider may ask you questions about your:  Alcohol use.  Tobacco use.  Drug use.  Emotional well-being.  Home and relationship well-being.  Sexual activity.  Eating habits.  History of falls.  Memory and ability to understand (cognition).  Work and work Statistician.  Screening You may have the following tests or measurements:  Height, weight, and BMI.  Blood pressure.  Lipid and cholesterol levels. These may be checked every 5 years, or more  frequently if you are over 67 years old.  Skin check.  Lung cancer screening. You may have this screening every year starting at age 73 if you have a 30-pack-year history of smoking and currently smoke or have quit within the past 15 years.  Fecal occult blood test (FOBT) of the stool. You may have this test every year starting at age 9.  Flexible sigmoidoscopy or colonoscopy. You may have a sigmoidoscopy every 5 years or a colonoscopy every 10 years starting at age 67.  Prostate cancer screening. Recommendations will vary depending on your family history and other risks.  Hepatitis C blood test.  Hepatitis B blood test.  Sexually transmitted disease (STD) testing.  Diabetes screening. This is done by checking your blood sugar (glucose) after you have not eaten for a while (fasting). You may have this done every 1-3 years.  Abdominal aortic aneurysm (AAA) screening. You may need this if you are a current or former smoker.  Osteoporosis. You may be screened starting at age 69 if you are at high risk.  Talk with your health care provider about your test results, treatment options, and if necessary, the need for more tests. Vaccines Your health care provider may recommend certain vaccines, such as:  Influenza vaccine. This is recommended every year.  Tetanus, diphtheria, and acellular pertussis (Tdap, Td) vaccine. You may need a Td booster every 10 years.  Varicella vaccine. You may need this if you have not been vaccinated.  Zoster vaccine. You may need this after age 7.  Measles, mumps, and rubella (MMR) vaccine. You may need  at least one dose of MMR if you were born in 1957 or later. You may also need a second dose.  Pneumococcal 13-valent conjugate (PCV13) vaccine. One dose is recommended after age 68.  Pneumococcal polysaccharide (PPSV23) vaccine. One dose is recommended after age 43.  Meningococcal vaccine. You may need this if you have certain conditions.  Hepatitis  A vaccine. You may need this if you have certain conditions or if you travel or work in places where you may be exposed to hepatitis A.  Hepatitis B vaccine. You may need this if you have certain conditions or if you travel or work in places where you may be exposed to hepatitis B.  Haemophilus influenzae type b (Hib) vaccine. You may need this if you have certain risk factors.  Talk to your health care provider about which screenings and vaccines you need and how often you need them. This information is not intended to replace advice given to you by your health care provider. Make sure you discuss any questions you have with your health care provider. Document Released: 01/25/2015 Document Revised: 09/18/2015 Document Reviewed: 10/30/2014 Elsevier Interactive Patient Education  2017 [employer].

## 2016-12-01 NOTE — Assessment & Plan Note (Signed)
Taking vitamin D 1000 IU daily and the 50000 IU weekly

## 2016-12-01 NOTE — Assessment & Plan Note (Signed)
Well controlled, no changes to meds. Encouraged heart healthy diet such as the DASH diet and exercise as tolerated.  °

## 2016-12-01 NOTE — Assessment & Plan Note (Signed)
Encouraged heart healthy diet, increase exercise, avoid trans fats, consider a . Atorvastatinkrill oil cap daily

## 2016-12-02 LAB — URINALYSIS, ROUTINE W REFLEX MICROSCOPIC
Bilirubin Urine: NEGATIVE
Hgb urine dipstick: NEGATIVE
Leukocytes, UA: NEGATIVE
Nitrite: NEGATIVE
PH: 5.5 (ref 5.0–8.0)
RBC / HPF: NONE SEEN (ref 0–?)
TOTAL PROTEIN, URINE-UPE24: 30 — AB
URINE GLUCOSE: 100 — AB
UROBILINOGEN UA: 0.2 (ref 0.0–1.0)

## 2016-12-03 LAB — URINE CULTURE
MICRO NUMBER: 81314839
SPECIMEN QUALITY: ADEQUATE

## 2016-12-04 MED FILL — traMADol HCL 50 MG TABS: 50 | 30 days supply | Qty: 90 | Fill #0

## 2016-12-04 MED FILL — GABAPENTIN 300 MG CAPSULE: 300 | 90 days supply | Qty: 270 | Fill #0

## 2016-12-06 DIAGNOSIS — R531 Weakness: Secondary | ICD-10-CM | POA: Insufficient documentation

## 2016-12-06 DIAGNOSIS — Z0000 Encounter for general adult medical examination without abnormal findings: Secondary | ICD-10-CM | POA: Insufficient documentation

## 2016-12-06 DIAGNOSIS — F0631 Mood disorder due to known physiological condition with depressive features: Secondary | ICD-10-CM | POA: Insufficient documentation

## 2016-12-06 DIAGNOSIS — R300 Dysuria: Secondary | ICD-10-CM | POA: Insufficient documentation

## 2016-12-06 NOTE — Assessment & Plan Note (Signed)
No new concerns or symptoms

## 2016-12-06 NOTE — Assessment & Plan Note (Signed)
As his health and mobility have declined his mood and energy level have also declined. He is agreeable to Fluoxetine 10 mg daily to try. reasses in 2-3 months.

## 2016-12-06 NOTE — Assessment & Plan Note (Signed)
Urine culture negative, increase hydration, report worsening symptoms

## 2016-12-06 NOTE — Assessment & Plan Note (Signed)
Encouraged adequate sleep and hydration, small frequent meals with lean proteins and minimal complex carbs. Increase activity level as tolerated.

## 2016-12-06 NOTE — Assessment & Plan Note (Signed)
Patient encouraged to maintain heart healthy diet, regular exercise, adequate sleep. Consider daily probiotics. Take medications as prescribed 

## 2016-12-08 ENCOUNTER — Other Ambulatory Visit (INDEPENDENT_AMBULATORY_CARE_PROVIDER_SITE_OTHER): Payer: PPO

## 2016-12-08 DIAGNOSIS — I10 Essential (primary) hypertension: Secondary | ICD-10-CM

## 2016-12-08 DIAGNOSIS — E559 Vitamin D deficiency, unspecified: Secondary | ICD-10-CM

## 2016-12-08 DIAGNOSIS — R739 Hyperglycemia, unspecified: Secondary | ICD-10-CM

## 2016-12-08 DIAGNOSIS — E782 Mixed hyperlipidemia: Secondary | ICD-10-CM

## 2016-12-08 LAB — PAIN MGMT, PROFILE 8 W/CONF, U
6 Acetylmorphine: NEGATIVE ng/mL (ref <10)
ALCOHOL METABOLITES: NEGATIVE ng/mL (ref <500)
ALPHAHYDROXYALPRAZOLAM: NEGATIVE ng/mL (ref <25)
ALPHAHYDROXYTRIAZOLAM: NEGATIVE ng/mL (ref <50)
AMPHETAMINE: NEGATIVE ng/mL (ref <250)
Alphahydroxymidazolam: NEGATIVE ng/mL (ref <50)
Aminoclonazepam: NEGATIVE ng/mL (ref <25)
Amphetamines: NEGATIVE ng/mL (ref <500)
BENZODIAZEPINES: NEGATIVE ng/mL (ref <100)
Buprenorphine, Urine: NEGATIVE ng/mL (ref <5)
COCAINE METABOLITE: NEGATIVE ng/mL (ref <150)
Creatinine: 159.8 mg/dL
HYDROXYETHYLFLURAZEPAM: NEGATIVE ng/mL (ref <50)
LORAZEPAM: NEGATIVE ng/mL (ref <50)
MARIJUANA METABOLITE: NEGATIVE ng/mL (ref <20)
MDMA: NEGATIVE ng/mL (ref <500)
Methamphetamine: NEGATIVE ng/mL (ref <250)
Nordiazepam: NEGATIVE ng/mL (ref <50)
OXAZEPAM: NEGATIVE ng/mL (ref <50)
OXYCODONE: NEGATIVE ng/mL (ref <100)
Opiates: NEGATIVE ng/mL (ref <100)
Oxidant: NEGATIVE ug/mL (ref <200)
PH: 5.39 (ref 4.5–9.0)
Temazepam: NEGATIVE ng/mL (ref <50)

## 2016-12-09 LAB — COMPREHENSIVE METABOLIC PANEL
ALBUMIN: 3.7 g/dL (ref 3.5–5.2)
ALK PHOS: 55 U/L (ref 39–117)
ALT: 8 U/L (ref 0–53)
AST: 13 U/L (ref 0–37)
BUN: 20 mg/dL (ref 6–23)
CHLORIDE: 107 meq/L (ref 96–112)
CO2: 26 mEq/L (ref 19–32)
CREATININE: 0.99 mg/dL (ref 0.40–1.50)
Calcium: 9 mg/dL (ref 8.4–10.5)
GFR: 76.31 mL/min (ref >60.00)
GLUCOSE: 85 mg/dL (ref 70–99)
Potassium: 4.5 mEq/L (ref 3.5–5.1)
SODIUM: 139 meq/L (ref 135–145)
TOTAL PROTEIN: 6.1 g/dL (ref 6.0–8.3)
Total Bilirubin: 0.7 mg/dL (ref 0.2–1.2)

## 2016-12-09 LAB — LIPID PANEL
CHOLESTEROL: 103 mg/dL (ref 0–200)
HDL: 39 mg/dL — ABNORMAL LOW (ref >39.00)
LDL Cholesterol: 48 mg/dL (ref 0–99)
NonHDL: 64.42
Total CHOL/HDL Ratio: 3
Triglycerides: 80 mg/dL (ref 0.0–149.0)
VLDL: 16 mg/dL (ref 0.0–40.0)

## 2016-12-09 LAB — CBC
HCT: 39.6 % (ref 39.0–52.0)
Hemoglobin: 13.4 g/dL (ref 13.0–17.0)
MCHC: 33.7 g/dL (ref 30.0–36.0)
MCV: 93.1 fl (ref 78.0–100.0)
Platelets: 256 10*3/uL (ref 150.0–400.0)
RBC: 4.25 Mil/uL (ref 4.22–5.81)
RDW: 14.1 % (ref 11.5–15.5)
WBC: 6.8 10*3/uL (ref 4.0–10.5)

## 2016-12-09 LAB — VITAMIN D 25 HYDROXY (VIT D DEFICIENCY, FRACTURES): VITD: 30.58 ng/mL (ref 30.00–100.00)

## 2016-12-09 LAB — HEMOGLOBIN A1C: Hgb A1c MFr Bld: 5.5 % (ref 4.6–6.5)

## 2016-12-09 MED FILL — AMLODIPINE BESYLATE 5 MG TA: 5 | 90 days supply | Qty: 180 | Fill #1

## 2016-12-11 ENCOUNTER — Other Ambulatory Visit: Payer: Self-pay | Admitting: Internal Medicine

## 2016-12-12 NOTE — Telephone Encounter (Signed)
How many refills Sir, thank you. 

## 2016-12-14 ENCOUNTER — Telehealth: Payer: Self-pay | Admitting: Family Medicine

## 2016-12-14 MED ORDER — NITROFURANTOIN MONOHYD MACRO 100 MG PO CAPS: 100.0000 mg | ORAL_CAPSULE | Freq: Two times a day (BID) | ORAL | 0 refills | Status: DC

## 2016-12-14 MED FILL — METOCLOPRAMIDE 5 MG TABLET: 5 | 30 days supply | Qty: 30 | Fill #0

## 2016-12-14 NOTE — Telephone Encounter (Signed)
Results and physician note reviewed with patient's wife. She reported he continues to have burning when he voids. Please advise.

## 2016-12-14 NOTE — Telephone Encounter (Signed)
Have him start Zinc 50 mg daily, increase hydration and we will try an empiric rx of Macrobid 100 mg po bid x 5 days, if no relief will need ot see urology for further work up

## 2016-12-14 NOTE — Telephone Encounter (Signed)
Refill x 3 and ask him/wife to see me in feb please

## 2016-12-14 NOTE — Telephone Encounter (Signed)
Please advise 

## 2016-12-14 NOTE — Telephone Encounter (Signed)
TC to patient. Reviewed physician's instructions with patient. He voiced understanding. Sent in [facility] prescription to [facility].

## 2016-12-15 MED FILL — NITROFURANTOIN MONO-MCR 100: 100 | 5 days supply | Qty: 10 | Fill #0

## 2016-12-21 ENCOUNTER — Other Ambulatory Visit: Payer: Self-pay | Admitting: Student

## 2016-12-21 ENCOUNTER — Telehealth: Payer: Self-pay | Admitting: Student

## 2016-12-21 MED ORDER — HYDRALAZINE HCL 25 MG PO TABS: 25.0000 mg | ORAL_TABLET | Freq: Two times a day (BID) | ORAL | 4 refills | Status: DC

## 2016-12-21 NOTE — Telephone Encounter (Signed)
   Patient called the on-call line due to not being able to find refills of his Methyldopa available (called several retail pharmacies along with [place]).  He had hyperkalemia and swelling (possible angioedema) with Valsartan in the past which limits our use of ACE-I and ARB's. Intolerant to HCTZ in the past and does not wish to be on diuretics. He has baseline bradycardia which limits the use of BB therapy. Already on Amlodipine 10mg  daily. Will switch Methyldopa to Hydralazine 25mg  BID. Can titrate as needed. I encouraged the patient to follow his BP closely and report back with the results.   He voiced understanding of this and was appreciative of the call. Will route to Dr. Harrington Challenger to make her aware of the changes.   Signed, Erma Heritage, PA-C 12/21/2016, 3:20 PM Pager: [phone]

## 2016-12-25 ENCOUNTER — Telehealth: Payer: Self-pay | Admitting: Internal Medicine

## 2016-12-25 ENCOUNTER — Telehealth: Payer: Self-pay | Admitting: Physician Assistant

## 2016-12-25 MED ORDER — HYDRALAZINE HCL 25 MG PO TABS: 25.0000 mg | ORAL_TABLET | Freq: Three times a day (TID) | ORAL | 3 refills | Status: DC

## 2016-12-25 NOTE — Telephone Encounter (Signed)
Follow up   Patient returning nurse call, states he did not get the message. Please call again

## 2016-12-25 NOTE — Telephone Encounter (Signed)
Patient complaining of elevated BP and dizziness. Patient took BP while on the phone and it's 194/79 HR 87. Patient had recent medication changes, see phone note 12/21/16. Consulted Dr. Radford Pax, she recommend patient take hydralazine 25 mg TID, and if patient's BP does not come down to call the on call doctor. Patient verbalized understanding.

## 2016-12-25 NOTE — Telephone Encounter (Signed)
Left message for patient to call back  

## 2016-12-25 NOTE — Telephone Encounter (Signed)
New Message   Pt c/o BP issue: STAT if pt c/o blurred vision, one-sided weakness or slurred speech  1. What are your last 5 BP readings? 12/13 204/77, 12/14 180/68  2. Are you having any other symptoms (ex. Dizziness, headache, blurred vision, passed out)? dizziness  3. What is your BP issue? Patient states bp is high

## 2016-12-25 NOTE — Telephone Encounter (Signed)
The patient called the answering service after-hours today. He handed phone to his wife. Recent issues noted with BP as outlined in prior phone notes after stopping methyldopa (unable to get due to local pharmacy availability). He was taking 5 methyldopa tablets throughout the day. He was started on hydralazine 25mg  BID 12/21/16. Today he took BP this AM and it was 204/103. He called office and wife states they were told to go to fire station to check pressure at which time it was 224/84. He was advised to increase hydralazine to TID. He did so, but pressure remained high so wife (former Therapist, sports) administered an old dose of clonidine 0.2mg . He had taken amlodipine on schedule today as well.  At time of phone call, she had not yet rechecked BP but called because it was high earlier. I had her recheck BP on the phone and it was 173/70. He feels mild dizziness but this is not unusual and no other focal sx. I think he likely needs more hydralazine to make up for prior methyldopa dose. Clonidine can be considered down the road if he does not make headway with hydralazine titration, but I advised against sporadic use given risk of rebound HTN. I advised the following: - check BP after 9pm. If SBP >160 start increased dose of hydralazine 50mg  TID tonight. - If SBP <160 (but not low), given hydralazine 25mg  tonight and start increased dose of hydralazine 50mg  TID in the morning. - also discussed sodium restriction which remains an issue per wife  Asked her to keep [place] updated if BP remains issue over the weekend. Warning sx/ER precautions reviewed. The patients wife verbalized understanding and gratitude. Will cc to Dr. Harrington Challenger. Did not yet change hydralazine RX in Epic as I anticipate he may need further titration beyond 50mg  TID. Dayna Dunn PA-C

## 2016-12-27 ENCOUNTER — Emergency Department (HOSPITAL_COMMUNITY)
Admission: EM | Admit: 2016-12-27 | Discharge: 2016-12-27 | Disposition: A | Discharge status: Home / Self Care | Payer: PPO | Attending: Emergency Medicine | Admitting: Emergency Medicine

## 2016-12-27 ENCOUNTER — Encounter (HOSPITAL_COMMUNITY): Payer: Self-pay

## 2016-12-27 ENCOUNTER — Other Ambulatory Visit: Payer: Self-pay

## 2016-12-27 DIAGNOSIS — J45909 Unspecified asthma, uncomplicated: Secondary | ICD-10-CM | POA: Insufficient documentation

## 2016-12-27 DIAGNOSIS — J302 Other seasonal allergic rhinitis: Secondary | ICD-10-CM | POA: Diagnosis not present

## 2016-12-27 DIAGNOSIS — Z87891 Personal history of nicotine dependence: Secondary | ICD-10-CM | POA: Insufficient documentation

## 2016-12-27 DIAGNOSIS — I10 Essential (primary) hypertension: Secondary | ICD-10-CM | POA: Insufficient documentation

## 2016-12-27 DIAGNOSIS — Z79899 Other long term (current) drug therapy: Secondary | ICD-10-CM | POA: Insufficient documentation

## 2016-12-27 LAB — URINALYSIS, ROUTINE W REFLEX MICROSCOPIC
Bilirubin Urine: NEGATIVE
Glucose, UA: NEGATIVE mg/dL
Hgb urine dipstick: NEGATIVE
KETONES UR: NEGATIVE mg/dL
LEUKOCYTES UA: NEGATIVE
NITRITE: NEGATIVE
PH: 6 (ref 5.0–8.0)
Protein, ur: NEGATIVE mg/dL
Specific Gravity, Urine: 1.005 (ref 1.005–1.030)

## 2016-12-27 LAB — BASIC METABOLIC PANEL
ANION GAP: 8 (ref 5–15)
BUN: 21 mg/dL — ABNORMAL HIGH (ref 6–20)
CALCIUM: 8.9 mg/dL (ref 8.9–10.3)
CHLORIDE: 108 mmol/L (ref 101–111)
CO2: 22 mmol/L (ref 22–32)
Creatinine, Ser: 0.95 mg/dL (ref 0.61–1.24)
GFR calc non Af Amer: >60 mL/min (ref >60)
Glucose, Bld: 120 mg/dL — ABNORMAL HIGH (ref 65–99)
Potassium: 4.3 mmol/L (ref 3.5–5.1)
Sodium: 138 mmol/L (ref 135–145)

## 2016-12-27 LAB — CBC
HEMATOCRIT: 39.3 % (ref 39.0–52.0)
HEMOGLOBIN: 13.4 g/dL (ref 13.0–17.0)
MCH: 30.9 pg (ref 26.0–34.0)
MCHC: 34.1 g/dL (ref 30.0–36.0)
MCV: 90.8 fL (ref 78.0–100.0)
Platelets: 213 10*3/uL (ref 150–400)
RBC: 4.33 MIL/uL (ref 4.22–5.81)
RDW: 13.3 % (ref 11.5–15.5)
WBC: 9.1 10*3/uL (ref 4.0–10.5)

## 2016-12-27 NOTE — ED Provider Notes (Signed)
[place] EMERGENCY DEPARTMENT Provider Note   CSN: [id number] Arrival date & time: 12/27/16  0141     History   Chief Complaint Chief Complaint  Patient presents with  . Hypertension    HPI [name] is a 83 y.o. male.  HPI 83 year old male who is followed by [facility] cardiology presenting to the emergency department with complaint of elevated blood pressure.  He states he was recently taken off of his methyldopa and is currently on Norvasc and 25 mg twice daily hydralazine.  He states over the past several days he has had episodes where he becomes flushed in the face of lightheaded.  No preceding chest pain or palpitations.  No shortness of breath.  Patient is asymptomatic at this time.  His wife is a retired Therapist, sports.  His blood pressure was checked multiple times.  His blood pressures consistently in the 702O-378H systolic with diastolic levels in the 885-027 range.  Cardiology was contacted by the patient and the patient's spouse today and was told that he should increase his hydralazine to 50 mg 3 times daily.  He took additional hydralazine at home as well as old in the 741-287 systolic range thus he came to the ER for evaluation.  No chest pain or shortness of breath at this time.  No headache.  Denies abdominal pain.  No confusion.   Past Medical History:  Diagnosis Date  . Acute bronchitis 06/10/2015  . Allergic rhinitis   . Anemia   . Asthma   . Carotid stenosis    a. s/p Left CEA 2002;  b. Carotid US 3/16:  patent R CEA, L < 40%  . Chronic pain syndrome    Left shoulder, back  . Diverticulosis   . Dyslipidemia   . Epigastric pain 04/05/2016  . Esophageal stricture    Distal, benign  . GERD (gastroesophageal reflux disease)   . Hemorrhoids   . HTN (hypertension)    Negative renal duplex 07-22-11  . Hx of echocardiogram    a. Echo 10/11: mod LVH, EF 55-60%  . Pneumonia 07-2013  . Prostate cancer ([place]) 2000   Seed XRT  . Spondylosis,  cervical, with myelopathy 11/26/2015  . 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 Dysuria 12/06/2016  . Weakness 12/06/2016  . 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 Preventative health care 12/06/2016  . Hyperglycemia 04/05/2016  . Epigastric pain 04/05/2016  . Spondylosis, cervical, with myelopathy 11/26/2015  . Cervicogenic headache 11/26/2015  . Umbilical hernia 86/76/7209  . Asthma 10/08/2011  . Carotid stenosis 03/31/2011  . Chronic pain   . Vitamin D deficiency 12/11/2008  . HLD (hyperlipidemia) 12/11/2008  . Anemia 12/11/2008  . Cerebral artery occlusion with cerebral infarction ( [place]) 12/11/2008  . URINARY INCONTINENCE, MALE 12/11/2008  . Disorder of bursae and tendons in shoulder region 12/29/2007  . ANXIETY, MILD 08/09/2007  . HYPERTENSION, BENIGN ESSENTIAL 01/04/2007  . Allergic rhinitis 01/04/2007  . HEADACHE, TENSION 11/05/2006  . PROSTATE CANCER, HX OF 11/05/2006    Past Surgical History:  Procedure Laterality Date  . APPENDECTOMY  1953  . CAROTID ENDARTERECTOMY Right 01/2000  . CATARACT EXTRACTION Bilateral   . CERVICAL DISC SURGERY  8/07  . INSERTION PROSTATE RADIATION SEED  2000  . [place] SURGERY  1990s  . NASAL SINUS SURGERY     x 3  Home Medications    Prior to Admission medications   Medication Sig Start Date End Date Taking? Authorizing Provider  albuterol (PROVENTIL HFA;VENTOLIN HFA) 108 (90 Base) MCG/ACT inhaler Inhale 1-2 puffs into the lungs every 6 (six) hours as needed for wheezing or shortness of breath. 03/02/15  Yes Melony Overly, MD  amLODipine (NORVASC) 5 MG tablet Take 1 tablet (5 mg total) by mouth 2 (two) times daily. 09/04/16  Yes Fay Records, MD  aspirin EC 325 MG tablet Take 325 mg by mouth daily.   Yes [provider]  atorvastatin (LIPITOR) 40 MG tablet TAKE 1 TABLET BY MOUTH ONCE DAILY Patient taking differently: TAKE 1  TABLET (40 MG) BY MOUTH ONCE DAILY 10/16/16  Yes [name], MD  cetirizine (ZYRTEC) 10 MG tablet Take 10 mg by mouth daily.   Yes [provider]  cholecalciferol (VITAMIN D) 1000 UNITS tablet Take 1,000 Units by mouth daily.   Yes [provider]  clopidogrel (PLAVIX) 75 MG tablet TAKE 1 TABLET BY MOUTH ONCE DAILY Patient taking differently: TAKE 1 TABLET (75 MG) BY MOUTH ONCE DAILY 10/22/16  Yes Fay Records, MD  docusate sodium (COLACE) 100 MG capsule Take 100 mg by mouth 2 (two) times daily as needed (constipation).    Yes [provider]  fluticasone (FLONASE) 50 MCG/ACT nasal spray PLACE 2 SPRAYS INTO BOTH NOSTRILS DAILY. Patient taking differently: PLACE 2 SPRAYS INTO BOTH NOSTRILS DAILY AS NEEDED FOR CONGESTION 07/09/15  Yes Hoyt Koch, MD  gabapentin (NEURONTIN) 300 MG capsule Take 1 capsule (300 mg total) by mouth 3 (three) times daily. 12/01/16  Yes [name], MD  hydrALAZINE (APRESOLINE) 25 MG tablet Take 1 tablet (25 mg total) by mouth 3 (three) times daily. Patient taking differently: Take 25 mg by mouth 2 (two) times daily.  12/25/16  Yes Turner, Eber Hong, MD  metoCLOPramide (REGLAN) 5 MG tablet TAKE 1 TABLET BY MOUTH AT BEDTIME Patient taking differently: TAKE 1 TABLET (5 MG) BY MOUTH AT BEDTIME 12/14/16  Yes Gatha Mayer, MD  montelukast (SINGULAIR) 10 MG tablet Take 1 tablet (10 mg total) by mouth at bedtime. 05/01/15  Yes Rigoberto Noel, MD  omeprazole (PRILOSEC) 40 MG capsule Take 1 capsule (40 mg total) by mouth daily. 12/19/14  Yes Rowe Clack, MD  ondansetron (ZOFRAN) 4 MG tablet TAKE 1 TABLET BY MOUTH EVERY 8 HOURS AS NEEDED FOR NAUSEA AND VOMITING Patient taking differently: TAKE 1 TABLET (4 MG) BY MOUTH EVERY 8 HOURS AS NEEDED FOR NAUSEA AND VOMITING 06/30/16  Yes Gatha Mayer, MD  polyethylene glycol (MIRALAX / GLYCOLAX) packet Take 17 g by mouth daily as needed for mild constipation. Mix in 8 oz liquid and drink    Yes [provider]  polyvinyl alcohol (ARTIFICIAL TEARS) 1.4 % ophthalmic solution Place 1 drop into both eyes daily as needed for dry eyes.   Yes [provider]  traMADol (ULTRAM) 50 MG tablet Take 1 tablet (50 mg total) by mouth every 8 (eight) hours as needed. Patient taking differently: Take 50 mg by mouth every 8 (eight) hours as needed (pain/headache).  12/01/16  Yes [name], MD  triamcinolone cream (KENALOG) 0.5 % Apply 1 application topically daily as needed (for skin irritation).  01/22/12  Yes Janith Lima, MD  Vitamin D, Ergocalciferol, (DRISDOL) 50000 units CAPS capsule Take 1 capsule (50,000 Units total) by mouth every 7 (seven) days. Patient taking differently: Take 50,000 Units  by mouth every Friday.  07/13/16  Yes [name], MD  FLUoxetine (PROZAC) 10 MG tablet Take 1 tablet (10 mg total) by mouth daily. Patient not taking: Reported on 12/27/2016 12/01/16   [name], MD    Family History Family History  Problem Relation Age of Onset  . Stroke Brother   . Stroke Sister   . Stroke Mother   . Hyperlipidemia Mother   . Hypertension Mother   . Lung disease Father   . Diabetes Son   . Gout Son   . Obesity Sister   . Alcohol abuse Brother   . Cancer Brother        lung, smoker    Social History Social History   Tobacco Use  . Smoking status: Former Smoker    Packs/day: 0.25    Years: 35.00    Pack years: 8.75    Types: Cigarettes    Last attempt to quit: 01/12/1978    Years since quitting: 38.9  . Smokeless tobacco: Current User    Types: Chew  Substance Use Topics  . Alcohol use: No    Comment: Prior alcoholic, sober since 8527  . Drug use: No     Allergies   Diovan [valsartan]; Hctz [hydrochlorothiazide]; Metformin and related; Codeine; and Oxycodone-acetaminophen   Review of Systems Review of Systems  All other systems reviewed and are negative.    Physical Exam Updated Vital Signs BP (!) 164/70   Pulse 61    Temp 98 F (36.7 C) (Oral)   Resp 18   Ht 5\' 11"  (1.803 m)   Wt 75.3 kg (166 lb)   SpO2 97%   BMI 23.15 kg/m   Physical Exam  Constitutional: He is oriented to person, place, and time. He appears well-developed and well-nourished.  HENT:  Head: Normocephalic and atraumatic.  Eyes: EOM are normal.  Neck: Normal range of motion.  Cardiovascular: Normal rate, regular rhythm, normal heart sounds and intact distal pulses.  Pulmonary/Chest: Effort normal and breath sounds normal. No respiratory distress.  Abdominal: Soft. He exhibits no distension. There is no tenderness.  Musculoskeletal: Normal range of motion.  Neurological: He is alert and oriented to person, place, and time.  Skin: Skin is warm and dry.  Psychiatric: He has a normal mood and affect. Judgment normal.  Nursing note and vitals reviewed.    ED Treatments / Results  Labs (all labs ordered are listed, but only abnormal results are displayed) Labs Reviewed  BASIC METABOLIC PANEL - Abnormal; Notable for the following components:      Result Value   Glucose, Bld 120 (*)    BUN 21 (*)    All other components within normal limits  URINALYSIS, ROUTINE W REFLEX MICROSCOPIC - Abnormal; Notable for the following components:   Color, Urine STRAW (*)    All other components within normal limits  CBC    EKG  EKG Interpretation None       Radiology No results found.  Procedures Procedures (including critical care time)  Medications Ordered in ED Medications - No data to display   Initial Impression / Assessment and Plan / ED Course  I have reviewed the triage vital signs and the nursing notes.  Pertinent labs & imaging results that were available during my care of the patient were reviewed by me and considered in my medical decision making (see chart for details).     Overall well-appearing in the emergency department.  Asymptomatic at this time.  Without  treatment his blood pressure is decreased at  140/80.  I suspect much of his blood pressure he was noticing at home was either falsely elevated secondary to anxiety or it was a phenomenon of rebound hypertension from additional blood pressure doses at home.  I had a long discussion with the patient regarding appropriate use of blood pressure medications as well as salt restriction and appropriate dietary changes that can be made to better manage his blood pressure.  He understands to follow-up closely with his cardiology team who is managing his hypertension.  Final Clinical Impressions(s) / ED Diagnoses   Final diagnoses:  Hypertension, unspecified type    ED Discharge Orders    None       Jola Schmidt, MD 12/27/16 [phone]

## 2016-12-27 NOTE — ED Notes (Signed)
Delay in lab draw pt currently in bathroom.

## 2016-12-27 NOTE — ED Triage Notes (Signed)
Pt states that PCP recently made changes to his BP meds, elevated for the past two days. Denies headache.

## 2016-12-27 NOTE — Discharge Instructions (Signed)
Take Hydralazine 50 mg 3 times a day.   Call your cardiology team for follow up  Record your blood pressure twice a day

## 2016-12-28 ENCOUNTER — Telehealth: Payer: Self-pay | Admitting: Internal Medicine

## 2016-12-28 ENCOUNTER — Telehealth: Payer: Self-pay

## 2016-12-28 NOTE — Telephone Encounter (Signed)
Pt c/o BP issue: STAT if pt c/o blurred vision, one-sided weakness or slurred speech  1. What are your last 5 BP readings? 221/104 ,  2. Are you having any other symptoms (ex. Dizziness, headache, blurred vision, passed out)? No  3. What is your BP issue? Elevated BP , Went to ER on Saturday night and was told to call Dr. Harrington Challenger office .

## 2016-12-28 NOTE — Telephone Encounter (Signed)
Follow up    Got disconnected , trying to speak with triage   Patient states he is having some flushing and numbness

## 2016-12-28 NOTE — Telephone Encounter (Signed)
Agree with plan 

## 2016-12-28 NOTE — Telephone Encounter (Signed)
Me   12/28/16 9:38 AM  Note    Patient called requesting an appointment with Dr. Harrington Challenger for BP control. Patient was seen in ED on 12/27/16 for hypertension, in the ED they increased hydralazine to 50mg  TID. Patient's spouse got on the phone demanding to see Dr. Harrington Challenger, she states that patient has been taking meds for 3 days and its not controlling his BP. She states he took clonidine 0.1 mg, hydralazine 50 mg and norsvac 10 mg this morning, but patient's BP has not been checked today and they were out at an appointment and had no way to check BP. They did not have any recorded BP checks since leaving the ED. Offered patient an appointment in the HTN clinic on 12/18 at [facility] and I scheduled patient a follow with Dr. Harrington Challenger on 02/20/16. Informed her to check BP, patient denies chest pain, no SOB, dizziness, HA, or lightheadedness. She is in agreement with plan and thanked me for the call.      Sent to Dr. Harrington Challenger for further recommendations.

## 2016-12-28 NOTE — Telephone Encounter (Signed)
Patient called requesting an appointment with Dr. Harrington Challenger for BP control. Patient was seen in ED on 12/27/16 for hypertension, in the ED they increased hydralazine to 50mg  TID. Patient's spouse got on the phone demanding to see Dr. Harrington Challenger, she states that patient has been taking meds for 3 days and its not controlling his BP. She states he took clonidine 0.1 mg, hydralazine 50 mg and norsvac 10 mg this morning, but patient's BP has not been checked today and they were out at an appointment and had no way to check BP. They did not have any recorded BP checks since leaving the ED. Offered patient an appointment in the HTN clinic on 12/18 at [facility] and I scheduled patient a follow with Dr. Harrington Challenger on 02/20/16. Informed her to check BP, patient denies chest pain, no SOB, dizziness, HA, or lightheadedness. She is in agreement with plan and thanked me for the call.

## 2016-12-29 ENCOUNTER — Ambulatory Visit (INDEPENDENT_AMBULATORY_CARE_PROVIDER_SITE_OTHER): Payer: PPO | Admitting: Pharmacist

## 2016-12-29 VITALS — BP 150/60 | HR 84

## 2016-12-29 DIAGNOSIS — I10 Essential (primary) hypertension: Secondary | ICD-10-CM | POA: Diagnosis not present

## 2016-12-29 MED ORDER — HYDRALAZINE HCL 100 MG PO TABS: 100.0000 mg | ORAL_TABLET | Freq: Three times a day (TID) | ORAL | 11 refills | Status: DC

## 2016-12-29 MED ORDER — SPIRONOLACTONE 25 MG PO TABS: 25.0000 mg | ORAL_TABLET | Freq: Every day | ORAL | 11 refills | Status: DC

## 2016-12-29 MED FILL — hydrALAZINE HCL 100 MG TABS: 100 | 30 days supply | Qty: 90 | Fill #0

## 2016-12-29 NOTE — Progress Notes (Signed)
Patient ID: [name]                 DOB: 06-26-1929                      MRN: [id number]     HPI: [name] is a 83 y.o. male referred by Dr. Harrington Challenger to HTN clinic. PMH is significant for HTN, CVA, HLD, and asthma. BP readings have historically been well controlled aside from a few low readings of 100/40s. Pt began having trouble with his BP last week when his methyldopa was on backorder and he was switched to hydralazine.  Pt was seen in the ED on 12/27/16 due to home BP readings of 180-220/120-130. BP in the ED was 140/80 without additional treatment. Pt presents today for BP follow up.  Pt presents today with his wife. They are frustrated that they could not get in to see Dr Harrington Challenger sooner than February. They are also frustrated that they receive different recommendations for blood pressure medications each time they call into clinic. They are very worried over patient's elevated BP since his readings were stable prior to methyldopa discontinuation. Pt was advised by retail pharmacist that it was being taken off the market. I do not see any recalls or shortages listed with the FDA so I am unsure about this being a long-term problem.  [name] has continued to check his BP at home and readings have remained elevated 180-220/80-120 using new home BP cuff. When his BP is elevated, he can feel his face flushing and he has headaches. He has been feeling ok on hydralazine but does not think it is working as well as his methydopa did. He has been feeling stressed out. He does not want to take diuretics due to urinary incontinence secondary to prostate cancer/surgery.  Current HTN meds:amlodipine 5mg  BID, hydralazine 50mg  TID Previously tried: valsartan (hyperkalemia, possible angioedema), HCTZ (swelling and dyspnea), clonidine patch (red arm), beta blockers (baseline bradycardia), methyldopa (drug shortage) BP goal: <140/86mmHg due to age  Family History: The patient's family history includes Alcohol  abuse in his brother; Cancer in his brother; Diabetes in his son; Gout in his son; Hyperlipidemia in his mother; Hypertension in his mother; Lung disease in his father; Obesity in his sister; Stroke in his brother, mother, and sister.   Social History: The patient  reports that he quit smoking about 38 years ago. His smoking use included Cigarettes. He has a 8.75 pack-year smoking history. His smokeless tobacco use includes Chew. He reports that he does not drink alcohol or use drugs.   Diet: Drinks 2 cups of coffee each day. Does not add salt to food.   Home BP readings: 180-220/80-120  Wt Readings from Last 3 Encounters:  12/27/16 166 lb (75.3 kg)  12/01/16 168 lb 12.8 oz (76.6 kg)  09/04/16 163 lb 9.6 oz (74.2 kg)   BP Readings from Last 3 Encounters:  12/27/16 (!) 164/70  12/01/16 137/63  09/04/16 (!) 108/48   Pulse Readings from Last 3 Encounters:  12/27/16 61  12/01/16 63  09/04/16 (!) 50    Renal function: Estimated Creatinine Clearance: 60.5 mL/min (by C-G formula based on SCr of 0.95 mg/dL).  Past Medical History:  Diagnosis Date  . Acute bronchitis 06/10/2015  . Allergic rhinitis   . Anemia   . Asthma   . Carotid stenosis    a. s/p Left CEA 2002;  b. Carotid US 3/16:  patent R CEA,  L < 40%  . Chronic pain syndrome    Left shoulder, back  . Diverticulosis   . Dyslipidemia   . Epigastric pain 04/05/2016  . Esophageal stricture    Distal, benign  . GERD (gastroesophageal reflux disease)   . Hemorrhoids   . HTN (hypertension)    Negative renal duplex 07-22-11  . Hx of echocardiogram    a. Echo 10/11: mod LVH, EF 55-60%  . Pneumonia 07-2013  . Prostate cancer ([place]) 2000   Seed XRT  . Spondylosis, cervical, with myelopathy 11/26/2015  . 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 albuterol (PROVENTIL HFA;VENTOLIN HFA) 108 (90 Base) MCG/ACT inhaler  Inhale 1-2 puffs into the lungs every 6 (six) hours as needed for wheezing or shortness of breath. 1 Inhaler 6  . amLODipine (NORVASC) 5 MG tablet Take 1 tablet (5 mg total) by mouth 2 (two) times daily. 180 tablet 1  . aspirin EC 325 MG tablet Take 325 mg by mouth daily.    [name] Kitchen atorvastatin (LIPITOR) 40 MG tablet TAKE 1 TABLET BY MOUTH ONCE DAILY (Patient taking differently: TAKE 1 TABLET (40 MG) BY MOUTH ONCE DAILY) 90 tablet 0  . cetirizine (ZYRTEC) 10 MG tablet Take 10 mg by mouth daily.    . cholecalciferol (VITAMIN D) 1000 UNITS tablet Take 1,000 Units by mouth daily.    . clopidogrel (PLAVIX) 75 MG tablet TAKE 1 TABLET BY MOUTH ONCE DAILY (Patient taking differently: TAKE 1 TABLET (75 MG) BY MOUTH ONCE DAILY) 90 tablet 3  . docusate sodium (COLACE) 100 MG capsule Take 100 mg by mouth 2 (two) times daily as needed (constipation).     [name] Kitchen FLUoxetine (PROZAC) 10 MG tablet Take 1 tablet (10 mg total) by mouth daily. (Patient not taking: Reported on 12/27/2016) 30 tablet 3  . fluticasone (FLONASE) 50 MCG/ACT nasal spray PLACE 2 SPRAYS INTO BOTH NOSTRILS DAILY. (Patient taking differently: PLACE 2 SPRAYS INTO BOTH NOSTRILS DAILY AS NEEDED FOR CONGESTION) 16 g 6  . gabapentin (NEURONTIN) 300 MG capsule Take 1 capsule (300 mg total) by mouth 3 (three) times daily. 270 capsule 1  . hydrALAZINE (APRESOLINE) 25 MG tablet Take 1 tablet (25 mg total) by mouth 3 (three) times daily. (Patient taking differently: Take 25 mg by mouth 2 (two) times daily. ) 90 tablet 3  . metoCLOPramide (REGLAN) 5 MG tablet TAKE 1 TABLET BY MOUTH AT BEDTIME (Patient taking differently: TAKE 1 TABLET (5 MG) BY MOUTH AT BEDTIME) 30 tablet 2  . montelukast (SINGULAIR) 10 MG tablet Take 1 tablet (10 mg total) by mouth at bedtime. 90 tablet 0  . omeprazole (PRILOSEC) 40 MG capsule Take 1 capsule (40 mg total) by mouth daily. 90 capsule 1  . ondansetron (ZOFRAN) 4 MG tablet TAKE 1 TABLET BY MOUTH EVERY 8 HOURS AS NEEDED FOR NAUSEA AND  VOMITING (Patient taking differently: TAKE 1 TABLET (4 MG) BY MOUTH EVERY 8 HOURS AS NEEDED FOR NAUSEA AND VOMITING) 30 tablet 1  . polyethylene glycol (MIRALAX / GLYCOLAX) packet Take 17 g by mouth daily as needed for mild constipation. Mix in 8 oz liquid and drink    . polyvinyl alcohol (ARTIFICIAL TEARS) 1.4 % ophthalmic solution Place 1 drop into both eyes daily as needed for dry eyes.    . traMADol (ULTRAM) 50 MG tablet Take 1 tablet (50 mg total) by mouth every 8 (  eight) hours as needed. (Patient taking differently: Take 50 mg by mouth every 8 (eight) hours as needed (pain/headache). ) 90 tablet 0  . triamcinolone cream (KENALOG) 0.5 % Apply 1 application topically daily as needed (for skin irritation).     . Vitamin D, Ergocalciferol, (DRISDOL) 50000 units CAPS capsule Take 1 capsule (50,000 Units total) by mouth every 7 (seven) days. (Patient taking differently: Take 50,000 Units by mouth every Friday. ) 4 capsule 8  . [DISCONTINUED] valsartan (DIOVAN) 160 MG tablet Take 160 mg by mouth daily.       No current facility-administered medications on file prior to visit.     Allergies  Allergen Reactions  . Diovan [Valsartan] Other (See Comments)    Elevated potassium hyperkalemia  . Hctz [Hydrochlorothiazide] Other (See Comments)    Swelling and dyspnea   . Metformin And Related Other (See Comments)    Unknown reaction  . Codeine Nausea Only  . Oxycodone-Acetaminophen Nausea Only     Assessment/Plan:  1. Hypertension - Clinic BP reading 150/60 which is much lower than home readings which have consistently been 026-378 systolic. Recent hypertension at home is likely due to rebound HTN after methyldopa discontinuation since BP issues began at the same time. Will increase hydralazine to 100mg  TID. Advised pt to continue to monitor his home BP readings. Due to patient's concerns over elevated BP readings and upcoming holiday, advised pt that if his systolic BP remains > 588 at home in 1  week, he can start taking spironolactone 25mg  daily. F/u in HTN clinic in 2 weeks. If urinary incontinence worsens on spironolactone, would need to start clonidine given intolerances to multiple other BP medications.   Megan E. Supple, PharmD, CPP, [place] [address] [place], [place] [address] Phone: [phone]; Fax: [phone] 12/29/2016 4:54 PM

## 2016-12-29 NOTE — Patient Instructions (Addendum)
Increase hydralazine to 100mg  3 times a day. Take your doses at 10am, 5pm, and midnight  Continue taking your amlodipine 5mg  twice a day  Follow up in clinic in 2 weeks. Please bring in your home cuff to this visit a long with home readings   If your systolic blood pressure remains elevated above 170 after 1 week, start taking spironolactone 25mg  daily as well

## 2016-12-30 ENCOUNTER — Other Ambulatory Visit: Payer: Self-pay | Admitting: Family Medicine

## 2016-12-31 ENCOUNTER — Telehealth: Payer: Self-pay | Admitting: *Deleted

## 2016-12-31 ENCOUNTER — Telehealth: Payer: Self-pay | Admitting: Internal Medicine

## 2016-12-31 MED ORDER — HYDRALAZINE HCL 100 MG PO TABS: 100.0000 mg | ORAL_TABLET | Freq: Three times a day (TID) | ORAL | 2 refills | Status: DC

## 2016-12-31 NOTE — Telephone Encounter (Signed)
Follow up  Pt c/o swelling: STAT is pt has developed SOB within 24 hours  1) How much weight have you gained and in what time span? N/A  2) If swelling, where is the swelling located? Swelling around eyes  3) Are you currently taking a fluid pill? NO  4) Are you currently SOB? NO  5) Do you have a log of your daily weights (if so, list)? N/A  6) Have you gained 3 pounds in a day or 5 pounds in a week? N/A  7) Have you traveled recently? NO

## 2016-12-31 NOTE — Telephone Encounter (Signed)
Hydralazine should not be causing abdominal pain. Home automatic BP readings have consistently been higher than manual readings by about 40 points. Manual reading in clinic and per wife's check today were in the 150s. Pt has been advised that the hydralazine will take up to a week to work and that he should bring in his home automatic cuff to his next visit since I suspect it has been overestimating his BP and it has been causing pt anxiety which further increases his BP.   2nd note states that pt is having swelling around his eyes which was not mentioned in the first call 20 minutes prior? Pt did not complain of this 2 days ago in clinic and it is unlikely that increasing his hydralazine dose from 75mg  to 100mg  would cause swelling around his eyes. However, he has only been on hydralazine for 10 days so it is possible that he could be having an allergic reaction.  Given patient's previous intolerances - valsartan (hyperkalemia, possible angioedema), HCTZ (swelling and dyspnea), clonidine patch (red arm), beta blockers (baseline bradycardia), methyldopa (drug shortage), diuretics (pt won't take due to urinary incontinence) - BP medication options are limited.   If pt prefers, he could decrease his hyralazine back to 50mg  TID to see if this helps with the eye swelling but would recommend that he pick up the spironolactone 25mg  that should be at his pharmacy. He would need a BMET in 1 week if he prefers to do this.

## 2016-12-31 NOTE — Telephone Encounter (Signed)
New message     [facility] calling to request order be written for 270 tabs of hydrALAZINE . Patient using mail order. 90 day supply. Questions call Tammy [phone]   *STAT* If patient is at the pharmacy, call can be transferred to refill team.   1. Which medications need to be refilled? (please list name of each medication and dose if known) hydrALAZINE (APRESOLINE) 100 MG tablet  2. Which pharmacy/location (including street and city if local pharmacy) is medication to be sent to? [place] [facility]- [phone]  3. Do they need a 30 day or 90 day supply? [place]

## 2016-12-31 NOTE — Telephone Encounter (Signed)
Informed Mrs. Erichsen of PharmD recommendations.  She verbalizes understanding.  Pt will continue hydralazyine, 100 mg TID for now.  If swelling around eyes gets worse, will cut back to 50 mg TID and will pick up spironolactone.  Aware that he will need lab work one week after if starts spironolactone.  Pt will call office if pt makes any changes/symptoms worsen.

## 2016-12-31 NOTE — Telephone Encounter (Signed)
Called patient to move his appointment up from Feb to Jan 9.  Spoke with wife. She communicated, with pt in background, that pt woke up this am with abd pain and nausea and thinks it could be from the hydralazine.   Also, a few times this week his chest got tight when he is up walking around.  No wheeze, or cough, gets SOB.  Advised to use albuterol inhaler prn for this.  Has singulair on med list but has not taken due to not having been back to lung doctor. Wife feels he gets much better in the afternoon when she takes him out. Advised will forward to Dr. Harrington Challenger and PharmD to make aware of his concern that abd pain and chest tightness are from increased hydralazine.  BP by auto cuff this am was 192/80 and 30 min later she took manual BP 152/58. She will call back Monday am with update on symptoms, or sooner if needed.

## 2016-12-31 NOTE — Telephone Encounter (Signed)
Pt's medication was sent to pt's pharmacy as requested. Confirmation received.  °

## 2017-01-01 ENCOUNTER — Telehealth: Payer: Self-pay | Admitting: Internal Medicine

## 2017-01-01 MED ORDER — CLONIDINE HCL 0.1 MG PO TABS: 0.1000 mg | ORAL_TABLET | Freq: Two times a day (BID) | ORAL | 11 refills | Status: DC

## 2017-01-01 MED FILL — SPIRONOLACTONE 25 MG TABLET: 25 | 30 days supply | Qty: 30 | Fill #0

## 2017-01-01 MED FILL — CloNIDine HCL 0.1 MG TAB: 0.1 | 30 days supply | Qty: 60 | Fill #0

## 2017-01-01 MED FILL — traMADol HCL 50 MG TABS: 50 | 30 days supply | Qty: 90 | Fill #0

## 2017-01-01 NOTE — Telephone Encounter (Signed)
Per Jinny Blossom, PharmD, pt should discontinue hydralazine.  Clonidine 0.1 mg BID ordered.  Pt will pick up and start this evening.  Will also pick up spironolactone 25 mg.  Will have on hand in case the clonidine does not work alone.  Advised to wait at least 3-4 days before adding spironolactone to give clonidine time to start working.  Advised to use only manual cuff, since automatic cuff over reads blood pressure.

## 2017-01-01 NOTE — Telephone Encounter (Signed)
Patient's wife reports he was SOB with chest pain a couple hours ago walking into a restaurant.  No CP currently.  Reviewed side effects of hydralazine which list chest pain in elderly as one.  Reviewing with PharmD to see if possible to obtain supply of methyldopa so that he could restart that, since he did well on it.  Advised patient's wife to call EMS if patient develops chest pain and SOB again, especially if without activity. His BP during the call is 190/60, HR 78

## 2017-01-01 NOTE — Telephone Encounter (Signed)
New message   Patient wife Refused to speak with Triage , only wants to speak with Michalene   Pt c/o of Chest Pain: STAT if CP now or developed within 24 hours  1. Are you having CP right now?  Yes   2. Are you experiencing any other symptoms (ex. SOB, nausea, vomiting, sweating)? Sob , cold and clammy , nauseated   3. How long have you been experiencing CP?  Started new meds first of the week   4. Is your CP continuous or coming and going?  Comes and goes   5. Have you taken Nitroglycerin?  No ?

## 2017-01-13 ENCOUNTER — Ambulatory Visit (INDEPENDENT_AMBULATORY_CARE_PROVIDER_SITE_OTHER): Payer: PPO | Admitting: Pharmacist

## 2017-01-13 ENCOUNTER — Ambulatory Visit: Payer: PPO

## 2017-01-13 VITALS — BP 148/62 | HR 53

## 2017-01-13 DIAGNOSIS — I10 Essential (primary) hypertension: Secondary | ICD-10-CM

## 2017-01-13 NOTE — Progress Notes (Deleted)
Patient ID: [name]                 DOB: [birth year]                      MRN: [id number]     HPI: [name] is a 84 y.o. male referred by Dr. Harrington Challenger to HTN clinic. PMH is significant for HTN, CVA, HLD, and asthma. BP readings have historically been well controlled aside from a few low readings of 100/40s. Pt began having trouble with his BP last week when his methyldopa was on backorder and he was switched to hydralazine.  Pt was seen in the ED on 12/27/16 due to home BP readings of 180-220/120-130. BP in the ED was 140/80 without additional treatment. Hydralazine was titrated at last visit, however pt's wife called clinic with pt reports of chest pain, nausea, and abdominal pain. Hydralazine was discontinued and pt was started on clonidine 0.1mg  BID with instructions to start spironolactone 3-4 days later if BP remained elevated.  Home cuff overestimating readings. Increase clonidine or spironolactone, need BMET if started spiro  Pt presents today with his wife. They are frustrated that they could not get in to see Dr Harrington Challenger sooner than February. They are also frustrated that they receive different recommendations for blood pressure medications each time they call into clinic. They are very worried over patient's elevated BP since his readings were stable prior to methyldopa discontinuation. Pt was advised by retail pharmacist that it was being taken off the market. I do not see any recalls or shortages listed with the FDA so I am unsure about this being a long-term problem.  [name] has continued to check his BP at home and readings have remained elevated 180-220/80-120 using new home BP cuff. When his BP is elevated, he can feel his face flushing and he has headaches. He has been feeling ok on hydralazine but does not think it is working as well as his methydopa did. He has been feeling stressed out. He does not want to take diuretics due to urinary incontinence secondary to prostate  cancer/surgery.  Current HTN meds:amlodipine 5mg  BID Previously tried: valsartan (hyperkalemia, possible angioedema), HCTZ (swelling and dyspnea), clonidine patch (red arm), beta blockers (baseline bradycardia), methyldopa (drug shortage), hydralazine (abdominal pain, nausea, chest pain) BP goal: <140/15mmHg due to age  Family History: The patient's family history includes Alcohol abuse in his brother; Cancer in his brother; Diabetes in his son; Gout in his son; Hyperlipidemia in his mother; Hypertension in his mother; Lung disease in his father; Obesity in his sister; Stroke in his brother, mother, and sister.  Social History: The patientreports that he quit smoking about 38 years ago. His smoking use included Cigarettes. He has a 8.75 pack-year smoking history. His smokeless tobacco use includes Chew. He reports that he does not drink alcohol or use drugs.  Diet: Drinks 2 cups of coffee each day. Does not add salt to food.   Home BP readings: 180-220/80-120:   Wt Readings from Last 3 Encounters:  12/27/16 166 lb (75.3 kg)  12/01/16 168 lb 12.8 oz (76.6 kg)  09/04/16 163 lb 9.6 oz (74.2 kg)   BP Readings from Last 3 Encounters:  12/29/16 (!) 150/60  12/27/16 (!) 164/70  12/01/16 137/63   Pulse Readings from Last 3 Encounters:  12/29/16 84  12/27/16 61  12/01/16 63    Renal function: CrCl cannot be calculated (Unknown ideal weight.).  Past Medical  History:  Diagnosis Date  . Acute bronchitis 06/10/2015  . Allergic rhinitis   . Anemia   . Asthma   . Carotid stenosis    a. s/p Left CEA 2002;  b. Carotid US 3/16:  patent R CEA, L < 40%  . Chronic pain syndrome    Left shoulder, back  . Diverticulosis   . Dyslipidemia   . Epigastric pain 04/05/2016  . Esophageal stricture    Distal, benign  . GERD (gastroesophageal reflux disease)   . Hemorrhoids   . HTN (hypertension)    Negative renal duplex 07-22-11  . Hx of echocardiogram    a. Echo 10/11: mod LVH, EF 55-60%   . Pneumonia 07-2013  . Prostate cancer ([place]) 2000   Seed XRT  . Spondylosis, cervical, with myelopathy 11/26/2015  . Stroke [facility]) 8/01   right thalamic - on chronic Plavix/ASA    Current Outpatient Medications on File Prior to Visit  Medication Sig Dispense Refill  . albuterol (PROVENTIL HFA;VENTOLIN HFA) 108 (90 Base) MCG/ACT inhaler Inhale 1-2 puffs into the lungs every 6 (six) hours as needed for wheezing or shortness of breath. 1 Inhaler 6  . amLODipine (NORVASC) 5 MG tablet Take 1 tablet (5 mg total) by mouth 2 (two) times daily. 180 tablet 1  . aspirin EC 325 MG tablet Take 325 mg by mouth daily.    [name] Kitchen atorvastatin (LIPITOR) 40 MG tablet TAKE 1 TABLET BY MOUTH ONCE DAILY (Patient taking differently: TAKE 1 TABLET (40 MG) BY MOUTH ONCE DAILY) 90 tablet 0  . cetirizine (ZYRTEC) 10 MG tablet Take 10 mg by mouth daily.    . cholecalciferol (VITAMIN D) 1000 UNITS tablet Take 1,000 Units by mouth daily.    . cloNIDine (CATAPRES) 0.1 MG tablet Take 1 tablet (0.1 mg total) by mouth 2 (two) times daily. 60 tablet 11  . clopidogrel (PLAVIX) 75 MG tablet TAKE 1 TABLET BY MOUTH ONCE DAILY (Patient taking differently: TAKE 1 TABLET (75 MG) BY MOUTH ONCE DAILY) 90 tablet 3  . docusate sodium (COLACE) 100 MG capsule Take 100 mg by mouth 2 (two) times daily as needed (constipation).     [name] Kitchen FLUoxetine (PROZAC) 10 MG tablet Take 1 tablet (10 mg total) by mouth daily. (Patient not taking: Reported on 12/27/2016) 30 tablet 3  . fluticasone (FLONASE) 50 MCG/ACT nasal spray PLACE 2 SPRAYS INTO BOTH NOSTRILS DAILY. (Patient taking differently: PLACE 2 SPRAYS INTO BOTH NOSTRILS DAILY AS NEEDED FOR CONGESTION) 16 g 6  . gabapentin (NEURONTIN) 300 MG capsule Take 1 capsule (300 mg total) by mouth 3 (three) times daily. 270 capsule 1  . metoCLOPramide (REGLAN) 5 MG tablet TAKE 1 TABLET BY MOUTH AT BEDTIME (Patient taking differently: TAKE 1 TABLET (5 MG) BY MOUTH AT BEDTIME) 30 tablet 2  . montelukast (SINGULAIR)  10 MG tablet Take 1 tablet (10 mg total) by mouth at bedtime. 90 tablet 0  . omeprazole (PRILOSEC) 40 MG capsule Take 1 capsule (40 mg total) by mouth daily. 90 capsule 1  . ondansetron (ZOFRAN) 4 MG tablet TAKE 1 TABLET BY MOUTH EVERY 8 HOURS AS NEEDED FOR NAUSEA AND VOMITING (Patient taking differently: TAKE 1 TABLET (4 MG) BY MOUTH EVERY 8 HOURS AS NEEDED FOR NAUSEA AND VOMITING) 30 tablet 1  . polyethylene glycol (MIRALAX / GLYCOLAX) packet Take 17 g by mouth daily as needed for mild constipation. Mix in 8 oz liquid and drink    . polyvinyl alcohol (ARTIFICIAL TEARS) 1.4 % ophthalmic solution Place  1 drop into both eyes daily as needed for dry eyes.    [name] Kitchen spironolactone (ALDACTONE) 25 MG tablet Take 1 tablet (25 mg total) by mouth daily. 30 tablet 11  . traMADol (ULTRAM) 50 MG tablet TAKE 1 TABLET BY MOUTH EVERY 8 HOURS AS NEEDED 90 tablet 0  . triamcinolone cream (KENALOG) 0.5 % Apply 1 application topically daily as needed (for skin irritation).     . Vitamin D, Ergocalciferol, (DRISDOL) 50000 units CAPS capsule Take 1 capsule (50,000 Units total) by mouth every 7 (seven) days. (Patient taking differently: Take 50,000 Units by mouth every Friday. ) 4 capsule 8  . [DISCONTINUED] valsartan (DIOVAN) 160 MG tablet Take 160 mg by mouth daily.       No current facility-administered medications on file prior to visit.     Allergies  Allergen Reactions  . Diovan [Valsartan] Other (See Comments)    Elevated potassium hyperkalemia  . Hctz [Hydrochlorothiazide] Other (See Comments)    Swelling and dyspnea   . Metformin And Related Other (See Comments)    Unknown reaction  . Codeine Nausea Only  . Oxycodone-Acetaminophen Nausea Only     Assessment/Plan:  1. Hypertension -

## 2017-01-13 NOTE — Patient Instructions (Signed)
It was nice to see you today  Continue taking your amlodipine and clonidine  If you check your blood pressure at home - check with a manual cuff. Your automatic cuff is overestimating readings by 30-40 points  Call clinic with any concerns

## 2017-01-13 NOTE — Progress Notes (Signed)
Patient ID: [name]                 DOB: 1929-11-13                      MRN: [id number]     HPI: [name] is a 84 y.o. male referred by Dr. Harrington Challenger to HTN clinic. PMH is significant for HTN, CVA, HLD, and asthma. BP readings have historically been well controlled aside from a few low readings of 100/40s. Pt began having trouble with his BP last week when his methyldopa was on backorder and he was switched to hydralazine.  Pt was seen in the ED on 12/27/16 due to home BP readings of 180-220/120-130. BP in the ED was 140/80 without additional treatment. Hydralazine was titrated at last visit, however pt's wife called clinic with pt reports of chest pain, nausea, and abdominal pain. Hydralazine was discontinued and pt was started on clonidine 0.1mg  BID with instructions to start spironolactone 3-4 days later if BP remained elevated.  Pt and his wife present today in good spirits. Pt reports feeling much better since stopping his hydralazine. He had been experiencing chest pain, trouble breathing, flushed cheeks, and GI upset on the hydralazine. All of these symptoms have resolved since stopping hydralazine. He has been tolerating clonidine well and has not needed to pick up the spironolactone. His wife has checked his BP at home with readings ranging 160-180/60-80s. He does complain of constipation. He takes 4 stool softeners at baseline (likely needed due to chronic tramadol use) but has used Mg citrate more recently. States he started noticing this before he started the clonidine. He denies dizziness or headaches. He had 1 cup of coffee today and has had a sausage biscuit high in sodium. Wife states pt is somewhat agitated. Avoiding diuretics due to urinary incontinence secondary to prostate cancer/surgery.  Home automatic cuff continues to overestimate BP readings: Home BP cuff: 180/80, HR 78 Clinic reading: 148/62 , HR 53   Advised pt to stop checking BP with automatic cuff since it is severely  overestimating BP readings.    Current HTN meds: amlodipine 5mg  BID, clonidine 0.1mg  BID Previously tried: valsartan (hyperkalemia, possible angioedema), HCTZ (swelling and dyspnea), clonidine patch (red arm), beta blockers (baseline bradycardia), methyldopa (drug shortage), hydralazine (abdominal pain, nausea, chest pain, trouble breathing) BP goal: <140/72mmHg due to age  Family History: The patient's family history includes Alcohol abuse in his brother; Cancer in his brother; Diabetes in his son; Gout in his son; Hyperlipidemia in his mother; Hypertension in his mother; Lung disease in his father; Obesity in his sister; Stroke in his brother, mother, and sister.  Social History: The patientreports that he quit smoking about 38 years ago. His smoking use included Cigarettes. He has a 8.75 pack-year smoking history. His smokeless tobacco use includes Chew. He reports that he does not drink alcohol or use drugs.  Diet: Drinks 2 cups of coffee each day. Does not add salt to food.   Home BP readings: Improved to 160-180/60-80s   Wt Readings from Last 3 Encounters:  12/27/16 166 lb (75.3 kg)  12/01/16 168 lb 12.8 oz (76.6 kg)  09/04/16 163 lb 9.6 oz (74.2 kg)   BP Readings from Last 3 Encounters:  12/29/16 (!) 150/60  12/27/16 (!) 164/70  12/01/16 137/63   Pulse Readings from Last 3 Encounters:  12/29/16 84  12/27/16 61  12/01/16 63    Renal function: CrCl cannot be calculated (  Unknown ideal weight.).  Past Medical History:  Diagnosis Date  . Acute bronchitis 06/10/2015  . Allergic rhinitis   . Anemia   . Asthma   . Carotid stenosis    a. s/p Left CEA 2002;  b. Carotid US 3/16:  patent R CEA, L < 40%  . Chronic pain syndrome    Left shoulder, back  . Diverticulosis   . Dyslipidemia   . Epigastric pain 04/05/2016  . Esophageal stricture    Distal, benign  . GERD (gastroesophageal reflux disease)   . Hemorrhoids   . HTN (hypertension)    Negative renal duplex  07-22-11  . Hx of echocardiogram    a. Echo 10/11: mod LVH, EF 55-60%  . Pneumonia 07-2013  . Prostate cancer ([place]) 2000   Seed XRT  . Spondylosis, cervical, with myelopathy 11/26/2015  . Stroke [facility]) 8/01   right thalamic - on chronic Plavix/ASA    Current Outpatient Medications on File Prior to Visit  Medication Sig Dispense Refill  . albuterol (PROVENTIL HFA;VENTOLIN HFA) 108 (90 Base) MCG/ACT inhaler Inhale 1-2 puffs into the lungs every 6 (six) hours as needed for wheezing or shortness of breath. 1 Inhaler 6  . amLODipine (NORVASC) 5 MG tablet Take 1 tablet (5 mg total) by mouth 2 (two) times daily. 180 tablet 1  . aspirin EC 325 MG tablet Take 325 mg by mouth daily.    [name] Kitchen atorvastatin (LIPITOR) 40 MG tablet TAKE 1 TABLET BY MOUTH ONCE DAILY (Patient taking differently: TAKE 1 TABLET (40 MG) BY MOUTH ONCE DAILY) 90 tablet 0  . cetirizine (ZYRTEC) 10 MG tablet Take 10 mg by mouth daily.    . cholecalciferol (VITAMIN D) 1000 UNITS tablet Take 1,000 Units by mouth daily.    . cloNIDine (CATAPRES) 0.1 MG tablet Take 1 tablet (0.1 mg total) by mouth 2 (two) times daily. 60 tablet 11  . clopidogrel (PLAVIX) 75 MG tablet TAKE 1 TABLET BY MOUTH ONCE DAILY (Patient taking differently: TAKE 1 TABLET (75 MG) BY MOUTH ONCE DAILY) 90 tablet 3  . docusate sodium (COLACE) 100 MG capsule Take 100 mg by mouth 2 (two) times daily as needed (constipation).     [name] Kitchen FLUoxetine (PROZAC) 10 MG tablet Take 1 tablet (10 mg total) by mouth daily. (Patient not taking: Reported on 12/27/2016) 30 tablet 3  . fluticasone (FLONASE) 50 MCG/ACT nasal spray PLACE 2 SPRAYS INTO BOTH NOSTRILS DAILY. (Patient taking differently: PLACE 2 SPRAYS INTO BOTH NOSTRILS DAILY AS NEEDED FOR CONGESTION) 16 g 6  . gabapentin (NEURONTIN) 300 MG capsule Take 1 capsule (300 mg total) by mouth 3 (three) times daily. 270 capsule 1  . metoCLOPramide (REGLAN) 5 MG tablet TAKE 1 TABLET BY MOUTH AT BEDTIME (Patient taking differently: TAKE 1  TABLET (5 MG) BY MOUTH AT BEDTIME) 30 tablet 2  . montelukast (SINGULAIR) 10 MG tablet Take 1 tablet (10 mg total) by mouth at bedtime. 90 tablet 0  . omeprazole (PRILOSEC) 40 MG capsule Take 1 capsule (40 mg total) by mouth daily. 90 capsule 1  . ondansetron (ZOFRAN) 4 MG tablet TAKE 1 TABLET BY MOUTH EVERY 8 HOURS AS NEEDED FOR NAUSEA AND VOMITING (Patient taking differently: TAKE 1 TABLET (4 MG) BY MOUTH EVERY 8 HOURS AS NEEDED FOR NAUSEA AND VOMITING) 30 tablet 1  . polyethylene glycol (MIRALAX / GLYCOLAX) packet Take 17 g by mouth daily as needed for mild constipation. Mix in 8 oz liquid and drink    . polyvinyl alcohol (ARTIFICIAL  TEARS) 1.4 % ophthalmic solution Place 1 drop into both eyes daily as needed for dry eyes.    [name] Kitchen spironolactone (ALDACTONE) 25 MG tablet Take 1 tablet (25 mg total) by mouth daily. 30 tablet 11  . traMADol (ULTRAM) 50 MG tablet TAKE 1 TABLET BY MOUTH EVERY 8 HOURS AS NEEDED 90 tablet 0  . triamcinolone cream (KENALOG) 0.5 % Apply 1 application topically daily as needed (for skin irritation).     . Vitamin D, Ergocalciferol, (DRISDOL) 50000 units CAPS capsule Take 1 capsule (50,000 Units total) by mouth every 7 (seven) days. (Patient taking differently: Take 50,000 Units by mouth every Friday. ) 4 capsule 8  . [DISCONTINUED] valsartan (DIOVAN) 160 MG tablet Take 160 mg by mouth daily.       No current facility-administered medications on file prior to visit.     Allergies  Allergen Reactions  . Diovan [Valsartan] Other (See Comments)    Elevated potassium hyperkalemia  . Hctz [Hydrochlorothiazide] Other (See Comments)    Swelling and dyspnea   . Metformin And Related Other (See Comments)    Unknown reaction  . Codeine Nausea Only  . Oxycodone-Acetaminophen Nausea Only     Assessment/Plan:  1. Hypertension - Ok with targeting goal <150/28mmHg due to patient's age and multitude of medication intolerances. Pt is tolerating clonidine 0.1mg  BID and amlodipine  5mg  BID. Will not increase clonidine dose due to potential to worsen constipation, lower HR (53 today since starting clonidine), and general side effect profile. Avoiding ARBs, diuretics, beta blockers, and hydralazine due to intolerances, methyldopa not available at pharmacy. Will continue current medications and advised pt's wife to only check BP at home using manual cuff since automatic cuff continues to overestimate systolic BP by 16-10RUEA. F/u in HTN clinic as needed. Pt will keep f/u with Dr Harrington Challenger in 1 week.   Megan E. Supple, PharmD, CPP, [place] [address], [place], [place] [address] Phone: [phone]; Fax: [phone] 01/13/2017 4:24 PM

## 2017-01-20 ENCOUNTER — Encounter: Payer: Self-pay | Admitting: Internal Medicine

## 2017-01-20 ENCOUNTER — Ambulatory Visit (INDEPENDENT_AMBULATORY_CARE_PROVIDER_SITE_OTHER): Payer: PPO | Admitting: Internal Medicine

## 2017-01-20 ENCOUNTER — Encounter (INDEPENDENT_AMBULATORY_CARE_PROVIDER_SITE_OTHER): Payer: Self-pay

## 2017-01-20 ENCOUNTER — Other Ambulatory Visit: Payer: Self-pay | Admitting: Family Medicine

## 2017-01-20 VITALS — BP 180/70 | HR 48 | Ht 71.0 in | Wt 166.2 lb

## 2017-01-20 DIAGNOSIS — E782 Mixed hyperlipidemia: Secondary | ICD-10-CM | POA: Diagnosis not present

## 2017-01-20 DIAGNOSIS — I679 Cerebrovascular disease, unspecified: Secondary | ICD-10-CM

## 2017-01-20 DIAGNOSIS — E559 Vitamin D deficiency, unspecified: Secondary | ICD-10-CM

## 2017-01-20 DIAGNOSIS — I10 Essential (primary) hypertension: Secondary | ICD-10-CM | POA: Diagnosis not present

## 2017-01-20 DIAGNOSIS — I6350 Cerebral infarction due to unspecified occlusion or stenosis of unspecified cerebral artery: Secondary | ICD-10-CM

## 2017-01-20 MED ORDER — SPIRONOLACTONE 25 MG PO TABS: 12.5000 mg | ORAL_TABLET | Freq: Every day | ORAL | 3 refills | Status: DC

## 2017-01-20 NOTE — Patient Instructions (Signed)
Your physician has recommended you make the following change in your medication:  1.) start aldactone 12.5 mg once daily  Your physician recommends that you return for lab work in: 7-10 days (BMET)  Your physician recommends that you schedule a follow-up appointment in: 2 months with Dr. Harrington Challenger.

## 2017-01-20 NOTE — Progress Notes (Signed)
Cardiology Office Note   Date:  01/20/2017   ID:  [name], DOB 1930-01-13, MRN [id number]  PCP:  [name], MD  Cardiologist:   [name], MD   F?u of HTN     History of Present Illness: [name] is a 84 y.o. male with a history of HTN, reactive airway dz, prior CVA  CV dz (s/o L CEA), HL   I saw him in August 2018  SInce I saw him he is no longer on methydopa  BP has been very difficult to control   He did not tolerate hydralazine In past he had problems with ARBs, diuretics, b blockers.   He was seen by M Supple 01/13/17  SBP was in the 150s     Since seen notes a little SOB  WOrking outside in yard.  No CP  No edema  Has had some wheezing      Outpatient Medications Prior to Visit  Medication Sig Dispense Refill  . albuterol (PROVENTIL HFA;VENTOLIN HFA) 108 (90 Base) MCG/ACT inhaler Inhale 1-2 puffs into the lungs every 6 (six) hours as needed for wheezing or shortness of breath. 1 Inhaler 6  . amLODipine (NORVASC) 5 MG tablet Take 1 tablet (5 mg total) by mouth 2 (two) times daily. 180 tablet 1  . aspirin EC 325 MG tablet Take 325 mg by mouth daily.    [name] Kitchen atorvastatin (LIPITOR) 40 MG tablet TAKE 1 TABLET BY MOUTH ONCE DAILY (Patient taking differently: TAKE 1 TABLET (40 MG) BY MOUTH ONCE DAILY) 90 tablet 0  . cholecalciferol (VITAMIN D) 1000 UNITS tablet Take 1,000 Units by mouth daily.    . cloNIDine (CATAPRES) 0.1 MG tablet Take 1 tablet (0.1 mg total) by mouth 2 (two) times daily. 60 tablet 11  . clopidogrel (PLAVIX) 75 MG tablet TAKE 1 TABLET BY MOUTH ONCE DAILY (Patient taking differently: TAKE 1 TABLET (75 MG) BY MOUTH ONCE DAILY) 90 tablet 3  . docusate sodium (COLACE) 100 MG capsule Take 100 mg by mouth 2 (two) times daily as needed (constipation).     [name] Kitchen FLUoxetine (PROZAC) 10 MG tablet Take 1 tablet (10 mg total) by mouth daily. 30 tablet 3  . fluticasone (FLONASE) 50 MCG/ACT nasal spray PLACE 2 SPRAYS INTO BOTH NOSTRILS DAILY. (Patient taking  differently: PLACE 2 SPRAYS INTO BOTH NOSTRILS DAILY AS NEEDED FOR CONGESTION) 16 g 6  . gabapentin (NEURONTIN) 300 MG capsule Take 1 capsule (300 mg total) by mouth 3 (three) times daily. 270 capsule 1  . metoCLOPramide (REGLAN) 5 MG tablet TAKE 1 TABLET BY MOUTH AT BEDTIME (Patient taking differently: TAKE 1 TABLET (5 MG) BY MOUTH AT BEDTIME) 30 tablet 2  . omeprazole (PRILOSEC) 40 MG capsule Take 1 capsule (40 mg total) by mouth daily. 90 capsule 1  . polyethylene glycol (MIRALAX / GLYCOLAX) packet Take 17 g by mouth daily as needed for mild constipation. Mix in 8 oz liquid and drink    . polyvinyl alcohol (ARTIFICIAL TEARS) 1.4 % ophthalmic solution Place 1 drop into both eyes daily as needed for dry eyes.    . traMADol (ULTRAM) 50 MG tablet TAKE 1 TABLET BY MOUTH EVERY 8 HOURS AS NEEDED 90 tablet 0  . triamcinolone cream (KENALOG) 0.5 % Apply 1 application topically daily as needed (for skin irritation).     . Vitamin D, Ergocalciferol, (DRISDOL) 50000 units CAPS capsule Take 1 capsule (50,000 Units total) by mouth every 7 (seven) days. (Patient taking differently: Take  50,000 Units by mouth every Friday. ) 4 capsule 8  . cetirizine (ZYRTEC) 10 MG tablet Take 10 mg by mouth daily.    . montelukast (SINGULAIR) 10 MG tablet Take 1 tablet (10 mg total) by mouth at bedtime. (Patient not taking: Reported on 01/20/2017) 90 tablet 0  . ondansetron (ZOFRAN) 4 MG tablet TAKE 1 TABLET BY MOUTH EVERY 8 HOURS AS NEEDED FOR NAUSEA AND VOMITING (Patient not taking: Reported on 01/20/2017) 30 tablet 1   No facility-administered medications prior to visit.      Allergies:   Diovan [valsartan]; Hctz [hydrochlorothiazide]; Hydralazine; Metformin and related; Codeine; and Oxycodone-acetaminophen   Past Medical History:  Diagnosis Date  . Acute bronchitis 06/10/2015  . Allergic rhinitis   . Anemia   . Asthma   . Carotid stenosis    a. s/p Left CEA 2002;  b. Carotid US 3/16:  patent R CEA, L < 40%  . Chronic  pain syndrome    Left shoulder, back  . Diverticulosis   . Dyslipidemia   . Epigastric pain 04/05/2016  . Esophageal stricture    Distal, benign  . GERD (gastroesophageal reflux disease)   . Hemorrhoids   . HTN (hypertension)    Negative renal duplex 07-22-11  . Hx of echocardiogram    a. Echo 10/11: mod LVH, EF 55-60%  . Pneumonia 07-2013  . Prostate cancer ([place]) 2000   Seed XRT  . Spondylosis, cervical, with myelopathy 11/26/2015  . Stroke [facility]) 8/01   right thalamic - on chronic Plavix/ASA    Past Surgical History:  Procedure Laterality Date  . APPENDECTOMY  1953  . CAROTID ENDARTERECTOMY Right 01/2000  . CATARACT EXTRACTION Bilateral   . CERVICAL DISC SURGERY  8/07  . INSERTION PROSTATE RADIATION SEED  2000  . [place] SURGERY  1990s  . NASAL SINUS SURGERY     x 3     Social History:  The patient  reports that he quit smoking about 39 years ago. His smoking use included cigarettes. He has a 8.75 pack-year smoking history. His smokeless tobacco use includes chew. He reports that he does not drink alcohol or use drugs.   Family History:  The patient's family history includes Alcohol abuse in his brother; Cancer in his brother; Diabetes in his son; Gout in his son; Hyperlipidemia in his mother; Hypertension in his mother; Lung disease in his father; Obesity in his sister; Stroke in his brother, mother, and sister.    ROS:  Please see the history of present illness. All other systems are reviewed and  Negative to the above problem except as noted.    PHYSICAL EXAM: VS:  BP (!) 180/70   Pulse (!) 48   Ht 5\' 11"  (1.803 m)   Wt 166 lb 3.2 oz (75.4 kg)   SpO2 95%   BMI 23.18 kg/m    BP is 175/70 on my check   GEN: Well nourished, well developed, in no acute distress  HEENT: normal  Neck: no JVD, carotid bruits, or masses Cardiac: RRR; no murmurs, rubs, or gallops, No edema Respiratory:  Mild rhonchi , wheeze GI: soft, nontender, nondistended, + BS  No  hepatomegaly  MS: no deformity Moving all extremities   Skin: warm and dry, no rash  Scab on L shin   Neuro:  Strength and sensation are intact Psych: euthymic mood, full affect   EKG:  EKG is not ordered   Lipid Panel    Component Value Date/Time   CHOL  103 12/08/2016 1448   TRIG 80.0 12/08/2016 1448   HDL 39.00 (L) 12/08/2016 1448   CHOLHDL 3 12/08/2016 1448   VLDL 16.0 12/08/2016 1448   LDLCALC 48 12/08/2016 1448      Wt Readings from Last 3 Encounters:  01/20/17 166 lb 3.2 oz (75.4 kg)  12/27/16 166 lb (75.3 kg)  12/01/16 168 lb 12.8 oz (76.6 kg)      ASSESSMENT AND PLAN 1   HTN  BP is high I have reviewed with M Supple  Not sure when methydopa will be available    For now I would reocmm continuing current meds and adding spironolactone 12.5 mg to regimen   Followe BP at home    CHeck BMET in 1 week    F/U in clinic later this winter   2  HL  Continue lipitor    4  CV dz  Followed by T Early  CV scan looked good in March    5  Bradycardia  Unchanged  Asymptomatic  Follow   Signed, [name], MD  01/20/2017 2:31 PM    [address], [place], [place]  [address] Phone: [phone]; Fax: [phone]

## 2017-01-21 MED FILL — ATORVASTATIN 40 MG TABLET: 40 | 90 days supply | Qty: 90 | Fill #0

## 2017-01-22 ENCOUNTER — Telehealth: Payer: Self-pay | Admitting: Internal Medicine

## 2017-01-22 NOTE — Telephone Encounter (Signed)
Patient wife calling, states that patient left bp cuff in a [employer]. Patient wife states that it may have been left at the scales at the appointment yesterday.

## 2017-01-25 NOTE — Telephone Encounter (Signed)
Called patient's wife.  They found his BP cuff at home.

## 2017-01-27 MED FILL — CLOPIDOGREL 75 MG TABLET: 75 | 90 days supply | Qty: 90 | Fill #1

## 2017-01-29 ENCOUNTER — Other Ambulatory Visit: Payer: PPO | Admitting: *Deleted

## 2017-01-29 ENCOUNTER — Other Ambulatory Visit: Payer: Self-pay | Admitting: Family Medicine

## 2017-01-29 DIAGNOSIS — I10 Essential (primary) hypertension: Secondary | ICD-10-CM

## 2017-01-29 NOTE — Telephone Encounter (Signed)
Requesting:tramadol Contract:yes UDS:no Last OV:12/01/16  Next OV:not scheduled  Last Refill:01/01/17  #[id number]   Please advise

## 2017-01-30 LAB — BASIC METABOLIC PANEL
BUN / CREAT RATIO: 18 (ref 10–24)
BUN: 17 mg/dL (ref 8–27)
CO2: 23 mmol/L (ref 20–29)
CREATININE: 0.95 mg/dL (ref 0.76–1.27)
Calcium: 8.6 mg/dL (ref 8.6–10.2)
Chloride: 106 mmol/L (ref 96–106)
GFR calc non Af Amer: 73 mL/min/{1.73_m2} (ref >59)
GFR, EST AFRICAN AMERICAN: 84 mL/min/{1.73_m2} (ref >59)
GLUCOSE: 127 mg/dL — AB (ref 65–99)
Potassium: 4.8 mmol/L (ref 3.5–5.2)
Sodium: 142 mmol/L (ref 134–144)

## 2017-01-30 MED FILL — CloNIDine HCL 0.1 MG TAB: 0.1 | 30 days supply | Qty: 60 | Fill #1

## 2017-02-01 ENCOUNTER — Telehealth: Payer: Self-pay | Admitting: Internal Medicine

## 2017-02-01 ENCOUNTER — Telehealth: Payer: Self-pay | Admitting: Pharmacist

## 2017-02-01 DIAGNOSIS — I10 Essential (primary) hypertension: Secondary | ICD-10-CM

## 2017-02-01 NOTE — Telephone Encounter (Signed)
New message ° °Pt verbalized that he is returning call for RN °

## 2017-02-01 NOTE — Telephone Encounter (Signed)
Spoke with patient regarding his lab results which were normal.  His concern is his BP (taking manually) by his wife, which last night was 200/70 and then again later 190/66.  I had him check his BP @ 4:35pm ( 193/?) did not remember diastolic number.  He is asymptomatic, no headache, dizziness, blurred vision, etc.   Patient verbalized understanding that we will follow up with this.

## 2017-02-01 NOTE — Telephone Encounter (Signed)
Needs UDS but then can fill

## 2017-02-01 NOTE — Progress Notes (Signed)
lpmtcb (lab results) 1/21 md

## 2017-02-01 NOTE — Telephone Encounter (Signed)
Pt called clinic stating his BP has remained elevated close to 200/70. BMET stable after spironolactone 12.5mg  daily was started by Dr Harrington Challenger on 01/20/17. Will increase spironolactone to 1 tablet daily and f/u with pt in HTN clinic in 1 week for BP check and BMET.

## 2017-02-02 ENCOUNTER — Other Ambulatory Visit: Payer: Self-pay | Admitting: Family Medicine

## 2017-02-02 MED ORDER — TRAMADOL HCL 50 MG PO TABS: 50.0000 mg | ORAL_TABLET | Freq: Three times a day (TID) | ORAL | 0 refills | Status: DC | PRN

## 2017-02-02 MED FILL — METOCLOPRAMIDE 5 MG TABLET: 5 | 30 days supply | Qty: 30 | Fill #1

## 2017-02-02 NOTE — Telephone Encounter (Signed)
Requesting:tramadol Contract:yes  UDS:low risk next screen 09/30/16 Last OV:12/01/16 Next OV:not schedule Last Refill:01/01/17  #[id number]   Please advise

## 2017-02-02 NOTE — Telephone Encounter (Signed)
Copied from [place]. Topic: Quick Communication - Rx Refill/Question >> Feb 02, 2017  2:10 PM Cleaster Corin, [place] wrote: Medication: tramadol Has the patient contacted their pharmacy? yes (Agent: If no, request that the patient contact the pharmacy for the refill.) Preferred Pharmacy (with phone number or street name): [place] [place] [address] [place] [address] Phone: [phone] Fax: [phone]   Agent: Please be advised that RX refills may take up to 3 business days. We ask that you follow-up with your pharmacy.

## 2017-02-02 NOTE — Telephone Encounter (Signed)
Medication refill - tramadol. Last office visit 12/01/16. Thanks.

## 2017-02-02 NOTE — Telephone Encounter (Signed)
Increase aldactone to 25 mg (he is on 12.5 mg now) Repeat BMET in 1 wk Will forward to pharmacy for any other input.

## 2017-02-03 ENCOUNTER — Telehealth: Payer: Self-pay | Admitting: Family Medicine

## 2017-02-03 MED FILL — traMADol HCL 50 MG TABS: 50 | 30 days supply | Qty: 90 | Fill #0

## 2017-02-03 NOTE — Telephone Encounter (Signed)
There is already another encounter with this question and I had already advised pt to increase his spironolactone to 25mg  daily. He has already been scheduled for follow up.

## 2017-02-03 NOTE — Telephone Encounter (Signed)
Copied from [place]. Topic: General - Other >> Feb 03, 2017  2:49 PM Cecelia Byars, NT wrote: Reason for CRM: Patient called wife will pick up prescription for Tramadol today.

## 2017-02-04 NOTE — Telephone Encounter (Signed)
rx sent

## 2017-02-10 ENCOUNTER — Other Ambulatory Visit: Payer: PPO

## 2017-02-10 ENCOUNTER — Ambulatory Visit: Payer: PPO

## 2017-02-17 DIAGNOSIS — H5310 Unspecified subjective visual disturbances: Secondary | ICD-10-CM | POA: Diagnosis not present

## 2017-02-17 DIAGNOSIS — H5201 Hypermetropia, right eye: Secondary | ICD-10-CM | POA: Diagnosis not present

## 2017-02-17 DIAGNOSIS — H52203 Unspecified astigmatism, bilateral: Secondary | ICD-10-CM | POA: Diagnosis not present

## 2017-02-17 DIAGNOSIS — H5212 Myopia, left eye: Secondary | ICD-10-CM | POA: Diagnosis not present

## 2017-02-18 ENCOUNTER — Ambulatory Visit: Payer: PPO

## 2017-02-19 ENCOUNTER — Ambulatory Visit: Payer: PPO | Admitting: Internal Medicine

## 2017-02-22 ENCOUNTER — Telehealth: Payer: Self-pay | Admitting: Pharmacist

## 2017-02-22 NOTE — Telephone Encounter (Signed)
[name] called to report that patient has been taking an extra clonidine for the last 3-4 days due to pressures running in [id number] systolic. She reports he does not have any symptoms with these higher pressures. He has taken the extra clonidine, but he believes this is making him constipated. Upon looking patient did have spiro increased just a short time ago. He is scheduled for follow up tomorrow. Advised ok to take extra dose of clonidine tonight if pressures are elevated. She has not checked pressures today as he will not let her. Advised that patient will likely have medications adjusted tomorrow with dose increase of spironolactone if lab work is ok, but will defer any changes until tomorrow. [name] states understanding and will keep appt tomorrow.

## 2017-02-23 ENCOUNTER — Other Ambulatory Visit: Payer: PPO

## 2017-02-23 ENCOUNTER — Ambulatory Visit (INDEPENDENT_AMBULATORY_CARE_PROVIDER_SITE_OTHER): Payer: PPO | Admitting: Pharmacist

## 2017-02-23 VITALS — BP 158/66 | HR 77

## 2017-02-23 DIAGNOSIS — I10 Essential (primary) hypertension: Secondary | ICD-10-CM | POA: Diagnosis not present

## 2017-02-23 MED ORDER — SPIRONOLACTONE 50 MG PO TABS: 50.0000 mg | ORAL_TABLET | Freq: Every day | ORAL | 11 refills | Status: DC

## 2017-02-23 MED FILL — SPIRONOLACTONE 50 MG TABLET: 50 | 30 days supply | Qty: 30 | Fill #0

## 2017-02-23 NOTE — Progress Notes (Signed)
Patient ID: [name]                 DOB: Sep 16, 1929                      MRN: [id number]     HPI: [name] is a 84 y.o. male referred by Dr. Tenny Craw to HTN clinic. PMH is significant for HTN, CVA, HLD, and asthma. BP readings have historically been well controlled aside from a few low readings of 100/40s. Pt began having trouble with his BP last week when his methyldopa was on backorder and he was switched to hydralazine.  Pt was seen in the ED on 12/27/16 due to home BP readings of 180-220/120-130. BP in the ED was 140/80 without additional treatment. Hydralazine was titrated at last visit, however pt's wife called clinic with pt reports of chest pain, nausea, and abdominal pain. Hydralazine was discontinued and pt was started on clonidine 0.1mg  BID. BP remained elevated at f/u with Dr Tenny Craw and pt was advised to start spironolactone 12.5mg  daily. Home BP readings remained elevated at 200/70 and f/u BMET was stable so spironolactone was increased to 25mg  daily.  Patient presents today with his wife. He is tolerating spironolactone 25mg  daily well. He has been taking an extra clonidine for the past week when SBP > 190. Home BP cuff has historically overestimated BP readings by 30-10mmHg systolic. Home readings of: 160/78, 192/70s, 210/78, 218/70. His face feels flushed when his BP is elevated. HR has remained low 60s at home, higher in clinic today despite extra clonidine dosing. He did not bring in his home cuff today  Of note, we are avoiding diuretics due to urinary incontinence secondary to prostate cancer/surgery and had not been titrating clonidine due to bradycardia.  Current HTN meds: amlodipine 5mg  BID, clonidine 0.1mg  BID - sometimes TID, spironolactone 25mg  daily (3PM) Previously tried: valsartan (hyperkalemia, possible angioedema), HCTZ (swelling and dyspnea), clonidine patch (red arm), beta blockers (baseline bradycardia), methyldopa (drug shortage), hydralazine (abdominal pain,  nausea, chest pain, trouble breathing) BP goal: <150/50mmHg due to age and multiple medication intolerances  Family History: The patient's family history includes Alcohol abuse in his brother; Cancer in his brother; Diabetes in his son; Gout in his son; Hyperlipidemia in his mother; Hypertension in his mother; Lung disease in his father; Obesity in his sister; Stroke in his brother, mother, and sister.  Social History: The patientreports that he quit smoking about 38 years ago. His smoking use included Cigarettes. He has a 8.75 pack-year smoking history. His smokeless tobacco use includes Chew. He reports that he does not drink alcohol or use drugs.  Diet: Drinks 2 cups of coffee each day. Does not add salt to food.   Home BP readings: Improved to 160-180/60-80s   Wt Readings from Last 3 Encounters:  01/20/17 166 lb 3.2 oz (75.4 kg)  12/27/16 166 lb (75.3 kg)  12/01/16 168 lb 12.8 oz (76.6 kg)   BP Readings from Last 3 Encounters:  01/20/17 (!) 180/70  01/13/17 (!) 148/62  12/29/16 (!) 150/60   Pulse Readings from Last 3 Encounters:  01/20/17 (!) 48  01/13/17 (!) 53  12/29/16 84    Renal function: CrCl cannot be calculated (Patient's most recent lab result is older than the maximum 21 days allowed.).  Past Medical History:  Diagnosis Date  . Acute bronchitis 06/10/2015  . Allergic rhinitis   . Anemia   . Asthma   . Carotid stenosis  a. s/p Left CEA 2002;  b. Carotid US 3/16:  patent R CEA, L < 40%  . Chronic pain syndrome    Left shoulder, back  . Diverticulosis   . Dyslipidemia   . Epigastric pain 04/05/2016  . Esophageal stricture    Distal, benign  . GERD (gastroesophageal reflux disease)   . Hemorrhoids   . HTN (hypertension)    Negative renal duplex 07-22-11  . Hx of echocardiogram    a. Echo 10/11: mod LVH, EF 55-60%  . Pneumonia 07-2013  . Prostate cancer (HCC) 2000   Seed XRT  . Spondylosis, cervical, with myelopathy 11/26/2015  . Stroke [facility])  8/01   right thalamic - on chronic Plavix/ASA    Current Outpatient Medications on File Prior to Visit  Medication Sig Dispense Refill  . albuterol (PROVENTIL HFA;VENTOLIN HFA) 108 (90 Base) MCG/ACT inhaler Inhale 1-2 puffs into the lungs every 6 (six) hours as needed for wheezing or shortness of breath. 1 Inhaler 6  . amLODipine (NORVASC) 5 MG tablet Take 1 tablet (5 mg total) by mouth 2 (two) times daily. 180 tablet 1  . aspirin EC 325 MG tablet Take 325 mg by mouth daily.    [name] Kitchen atorvastatin (LIPITOR) 40 MG tablet TAKE 1 TABLET BY MOUTH ONCE DAILY 90 tablet 0  . cholecalciferol (VITAMIN D) 1000 UNITS tablet Take 1,000 Units by mouth daily.    . cloNIDine (CATAPRES) 0.1 MG tablet Take 1 tablet (0.1 mg total) by mouth 2 (two) times daily. 60 tablet 11  . clopidogrel (PLAVIX) 75 MG tablet TAKE 1 TABLET BY MOUTH ONCE DAILY (Patient taking differently: TAKE 1 TABLET (75 MG) BY MOUTH ONCE DAILY) 90 tablet 3  . docusate sodium (COLACE) 100 MG capsule Take 100 mg by mouth 2 (two) times daily as needed (constipation).     [name] Kitchen FLUoxetine (PROZAC) 10 MG tablet Take 1 tablet (10 mg total) by mouth daily. 30 tablet 3  . fluticasone (FLONASE) 50 MCG/ACT nasal spray PLACE 2 SPRAYS INTO BOTH NOSTRILS DAILY. (Patient taking differently: PLACE 2 SPRAYS INTO BOTH NOSTRILS DAILY AS NEEDED FOR CONGESTION) 16 g 6  . gabapentin (NEURONTIN) 300 MG capsule Take 1 capsule (300 mg total) by mouth 3 (three) times daily. 270 capsule 1  . metoCLOPramide (REGLAN) 5 MG tablet TAKE 1 TABLET BY MOUTH AT BEDTIME (Patient taking differently: TAKE 1 TABLET (5 MG) BY MOUTH AT BEDTIME) 30 tablet 2  . omeprazole (PRILOSEC) 40 MG capsule Take 1 capsule (40 mg total) by mouth daily. 90 capsule 1  . polyethylene glycol (MIRALAX / GLYCOLAX) packet Take 17 g by mouth daily as needed for mild constipation. Mix in 8 oz liquid and drink    . polyvinyl alcohol (ARTIFICIAL TEARS) 1.4 % ophthalmic solution Place 1 drop into both eyes daily as  needed for dry eyes.    [name] Kitchen spironolactone (ALDACTONE) 25 MG tablet Take 1 tablet (25 mg total) by mouth daily.    . traMADol (ULTRAM) 50 MG tablet Take 1 tablet (50 mg total) by mouth every 8 (eight) hours as needed. 90 tablet 0  . triamcinolone cream (KENALOG) 0.5 % Apply 1 application topically daily as needed (for skin irritation).     . Vitamin D, Ergocalciferol, (DRISDOL) 50000 units CAPS capsule Take 1 capsule (50,000 Units total) by mouth every 7 (seven) days. (Patient taking differently: Take 50,000 Units by mouth every Friday. ) 4 capsule 8  . [DISCONTINUED] valsartan (DIOVAN) 160 MG tablet Take 160 mg by mouth daily.  No current facility-administered medications on file prior to visit.     Allergies  Allergen Reactions  . Diovan [Valsartan] Other (See Comments)    Elevated potassium hyperkalemia  . Hctz [Hydrochlorothiazide] Other (See Comments)    Swelling and dyspnea   . Hydralazine     Chest pain, GI issues, shortness of breath  . Metformin And Related Other (See Comments)    Unknown reaction  . Codeine Nausea Only  . Oxycodone-Acetaminophen Nausea Only     Assessment/Plan:  1. Hypertension - Ok with targeting goal <150/85mmHg due to patient's age and multitude of medication intolerances. Home BP readings continue to overestimate systolic BP by up to . Clinic BP on recheck improved from 168 to 158 systolic, drastic difference from SBP 190-210 at home. Will check BMET today and if stable, increase spironolactone to 50mg  daily. Continue clonidine 0.1mg  BID - ok with pt continuing to take an extra 0.1mg  tablet if SBP > . Pt has f/u with Dr Tenny Craw in 2 weeks. Added BMET to this visit. Of note, pt is going out of town to [place] with his wife in a few weeks. If he needs to move his appt with Dr Tenny Craw, he has been instructed to call me so that I can see him for a BP visit and BMET prior to leaving town.   [name], PharmD, CPP, BCACP [facility] [address] [place], [place] [address] Phone: [phone]; Fax: [phone] 02/23/2017 8:11 AM

## 2017-02-23 NOTE — Patient Instructions (Addendum)
We will check your labs today - if everything looks stable, we will increase your spironolactone to 50mg  daily  Follow up in clinic in 2 weeks for your appt with Dr Harrington Challenger. We will check labs that day. Please bring your home readings and blood pressure cuff with you.  If you cannot keep your appt with Dr Harrington Challenger, call Jinny Blossom [phone] to reschedule earlier appt for blood pressure check and lab work

## 2017-02-24 ENCOUNTER — Telehealth: Payer: Self-pay | Admitting: Pharmacist

## 2017-02-24 LAB — BASIC METABOLIC PANEL
BUN/Creatinine Ratio: 18 (ref 10–24)
BUN: 18 mg/dL (ref 8–27)
CHLORIDE: 102 mmol/L (ref 96–106)
CO2: 22 mmol/L (ref 20–29)
Calcium: 9.2 mg/dL (ref 8.6–10.2)
Creatinine, Ser: 1.01 mg/dL (ref 0.76–1.27)
GFR calc Af Amer: 78 mL/min/{1.73_m2} (ref >59)
GFR calc non Af Amer: 68 mL/min/{1.73_m2} (ref >59)
GLUCOSE: 139 mg/dL — AB (ref 65–99)
Potassium: 5.3 mmol/L — ABNORMAL HIGH (ref 3.5–5.2)
SODIUM: 141 mmol/L (ref 134–144)

## 2017-02-24 NOTE — Telephone Encounter (Addendum)
BMET shows increased K to 5.3 since starting spironolactone. However, pt has an extensive list of intolerances to BP medications including: valsartan (hyperkalemia, possible angioedema), HCTZ (swelling and dyspnea),clonidine patch (red arm),beta blockers (baseline bradycardia), methyldopa (drug shortage), hydralazine (abdominal pain, nausea, chest pain, trouble breathing).  Options are very limited for BP control. Will continue spironolactone 25mg  daily and recheck BMET in 2 weeks when pt comes in for OV with Dr Harrington Challenger. Will have pt take his clonidine TID instead of BID on a scheduled basis rather than PRN.   Discussed medication plan with patient's wife who is in agreement with plan. Will route updated plan to Dr Harrington Challenger.

## 2017-02-24 NOTE — Addendum Note (Signed)
Addended by: Joretta Eads E on: 02/24/2017 02:06 PM   Modules accepted: Orders

## 2017-03-02 ENCOUNTER — Telehealth: Payer: Self-pay | Admitting: Internal Medicine

## 2017-03-02 ENCOUNTER — Ambulatory Visit (INDEPENDENT_AMBULATORY_CARE_PROVIDER_SITE_OTHER): Payer: PPO | Admitting: Pharmacist

## 2017-03-02 ENCOUNTER — Other Ambulatory Visit: Payer: PPO

## 2017-03-02 ENCOUNTER — Encounter (INDEPENDENT_AMBULATORY_CARE_PROVIDER_SITE_OTHER): Payer: Self-pay

## 2017-03-02 ENCOUNTER — Telehealth: Payer: Self-pay

## 2017-03-02 VITALS — BP 172/74 | HR 72

## 2017-03-02 DIAGNOSIS — I10 Essential (primary) hypertension: Secondary | ICD-10-CM | POA: Diagnosis not present

## 2017-03-02 MED ORDER — OMEPRAZOLE 40 MG PO CPDR: 40.0000 mg | DELAYED_RELEASE_CAPSULE | Freq: Every day | ORAL | 0 refills | Status: DC

## 2017-03-02 MED ORDER — PANTOPRAZOLE SODIUM 40 MG PO TBEC: 40.0000 mg | DELAYED_RELEASE_TABLET | Freq: Every day | ORAL | 3 refills | Status: DC

## 2017-03-02 MED FILL — PANTOPRAZOLE SOD DR 40 MG T: 40 | 90 days supply | Qty: 90 | Fill #0

## 2017-03-02 NOTE — Telephone Encounter (Signed)
We can do that  Pantoprazole 40 mg instead of omeprazole  Qd before breakfast and # 90 3 RF  Wife and patient may want an explanation

## 2017-03-02 NOTE — Telephone Encounter (Signed)
[name] from [facility] called and she is asking about patients omeprazole rx.  Patient is on clopidogrel and they want to know if you want to change his omeprazole to pantoprazole due to the possible interaction between medicines.  Please advise Sir, thank you.

## 2017-03-02 NOTE — Patient Instructions (Addendum)
It was nice to see you today  Depending on your lab work, we will start either low dose chlorthalidone or minoxidil

## 2017-03-02 NOTE — Telephone Encounter (Signed)
Omeprazole refilled as requested. 

## 2017-03-02 NOTE — Progress Notes (Signed)
Patient ID: [name]                 DOB: Jun 25, 1929                      MRN: [id number]     HPI: [name] is a 84 y.o. male referred by Dr. Tenny Craw to HTN clinic. PMH is significant for HTN, CVA, HLD, and asthma. BP readings have historically been well controlled on methyldopa, however this has been on backorder for the past few months and there is no return date soon. Pt is intolerant to medications from every HTN class which has been making BP management difficult. Most recently, spironolactone was increased to 25mg  daily, however f/u BMET showed K at 5.3 preventing further titration of spironolactone. Repeat BMET scheduled for today.  Pt reports experiencing some GI upset with his spironolactone the past few days. He has been eating crackers when he takes it which has helped. He has been taking his clonidine TID, however his constipation has become worse. He is currently using 4 stool softeners each day as well as Miralax with prune juice. He took his BP medications 2 hours ago. He brings in his home manual BP cuff today which read similar to clinic reading using manual cuff.  Current HTN meds: amlodipine 5mg  BID, clonidine 0.1mg  TID, spironolactone 25mg  daily (3PM) Previously tried: valsartan (hyperkalemia, possible angioedema), HCTZ (swelling and dyspnea), clonidine patch (red arm), beta blockers (baseline bradycardia), methyldopa (drug shortage), hydralazine (abdominal pain, nausea, chest pain, trouble breathing) BP goal: <150/6mmHg due to age and multiple medication intolerances  Family History: The patient's family history includes Alcohol abuse in his brother; Cancer in his brother; Diabetes in his son; Gout in his son; Hyperlipidemia in his mother; Hypertension in his mother; Lung disease in his father; Obesity in his sister; Stroke in his brother, mother, and sister.  Social History: The patientreports that he quit smoking about 38 years ago. His smoking use included  Cigarettes. He has a 8.75 pack-year smoking history. His smokeless tobacco use includes Chew. He reports that he does not drink alcohol or use drugs.  Diet: Drinks 2 cups of coffee each day. Does not add salt to food.   Home BP readings: Improved to 160-180/60-80s   Wt Readings from Last 3 Encounters:  01/20/17 166 lb 3.2 oz (75.4 kg)  12/27/16 166 lb (75.3 kg)  12/01/16 168 lb 12.8 oz (76.6 kg)   BP Readings from Last 3 Encounters:  02/23/17 (!) 158/66  01/20/17 (!) 180/70  01/13/17 (!) 148/62   Pulse Readings from Last 3 Encounters:  02/23/17 77  01/20/17 (!) 48  01/13/17 (!) 53    Renal function: CrCl cannot be calculated (Unknown ideal weight.).  Past Medical History:  Diagnosis Date  . Acute bronchitis 06/10/2015  . Allergic rhinitis   . Anemia   . Asthma   . Carotid stenosis    a. s/p Left CEA 2002;  b. Carotid US 3/16:  patent R CEA, L < 40%  . Chronic pain syndrome    Left shoulder, back  . Diverticulosis   . Dyslipidemia   . Epigastric pain 04/05/2016  . Esophageal stricture    Distal, benign  . GERD (gastroesophageal reflux disease)   . Hemorrhoids   . HTN (hypertension)    Negative renal duplex 07-22-11  . Hx of echocardiogram    a. Echo 10/11: mod LVH, EF 55-60%  . Pneumonia 07-2013  . Prostate cancer (  HCC) 2000   Seed XRT  . Spondylosis, cervical, with myelopathy 11/26/2015  . Stroke [facility]) 8/01   right thalamic - on chronic Plavix/ASA    Current Outpatient Medications on File Prior to Visit  Medication Sig Dispense Refill  . albuterol (PROVENTIL HFA;VENTOLIN HFA) 108 (90 Base) MCG/ACT inhaler Inhale 1-2 puffs into the lungs every 6 (six) hours as needed for wheezing or shortness of breath. 1 Inhaler 6  . amLODipine (NORVASC) 5 MG tablet Take 1 tablet (5 mg total) by mouth 2 (two) times daily. 180 tablet 1  . aspirin EC 325 MG tablet Take 325 mg by mouth daily.    [name] Kitchen atorvastatin (LIPITOR) 40 MG tablet TAKE 1 TABLET BY MOUTH ONCE DAILY 90 tablet 0   . cholecalciferol (VITAMIN D) 1000 UNITS tablet Take 1,000 Units by mouth daily.    . cloNIDine (CATAPRES) 0.1 MG tablet Take 1 tablet (0.1 mg total) by mouth 3 (three) times daily. 60 tablet 11  . clopidogrel (PLAVIX) 75 MG tablet TAKE 1 TABLET BY MOUTH ONCE DAILY (Patient taking differently: TAKE 1 TABLET (75 MG) BY MOUTH ONCE DAILY) 90 tablet 3  . docusate sodium (COLACE) 100 MG capsule Take 100 mg by mouth 2 (two) times daily as needed (constipation).     [name] Kitchen FLUoxetine (PROZAC) 10 MG tablet Take 1 tablet (10 mg total) by mouth daily. 30 tablet 3  . fluticasone (FLONASE) 50 MCG/ACT nasal spray PLACE 2 SPRAYS INTO BOTH NOSTRILS DAILY. (Patient taking differently: PLACE 2 SPRAYS INTO BOTH NOSTRILS DAILY AS NEEDED FOR CONGESTION) 16 g 6  . gabapentin (NEURONTIN) 300 MG capsule Take 1 capsule (300 mg total) by mouth 3 (three) times daily. 270 capsule 1  . metoCLOPramide (REGLAN) 5 MG tablet TAKE 1 TABLET BY MOUTH AT BEDTIME (Patient taking differently: TAKE 1 TABLET (5 MG) BY MOUTH AT BEDTIME) 30 tablet 2  . omeprazole (PRILOSEC) 40 MG capsule Take 1 capsule (40 mg total) by mouth daily. 90 capsule 1  . polyethylene glycol (MIRALAX / GLYCOLAX) packet Take 17 g by mouth daily as needed for mild constipation. Mix in 8 oz liquid and drink    . polyvinyl alcohol (ARTIFICIAL TEARS) 1.4 % ophthalmic solution Place 1 drop into both eyes daily as needed for dry eyes.    [name] Kitchen spironolactone (ALDACTONE) 50 MG tablet Take 0.5 tablets (25 mg total) by mouth daily. 30 tablet 11  . traMADol (ULTRAM) 50 MG tablet Take 1 tablet (50 mg total) by mouth every 8 (eight) hours as needed. 90 tablet 0  . triamcinolone cream (KENALOG) 0.5 % Apply 1 application topically daily as needed (for skin irritation).     . Vitamin D, Ergocalciferol, (DRISDOL) 50000 units CAPS capsule Take 1 capsule (50,000 Units total) by mouth every 7 (seven) days. (Patient taking differently: Take 50,000 Units by mouth every Friday. ) 4 capsule 8  .  [DISCONTINUED] valsartan (DIOVAN) 160 MG tablet Take 160 mg by mouth daily.       No current facility-administered medications on file prior to visit.     Allergies  Allergen Reactions  . Diovan [Valsartan] Other (See Comments)    Elevated potassium hyperkalemia  . Hctz [Hydrochlorothiazide] Other (See Comments)    Swelling and dyspnea   . Hydralazine     Chest pain, GI issues, shortness of breath  . Metformin And Related Other (See Comments)    Unknown reaction  . Codeine Nausea Only  . Oxycodone-Acetaminophen Nausea Only     Assessment/Plan:  1.  Hypertension - Ok with targeting goal <150/42mmHg due to patient's age and multitude of medication intolerances. Checking BMET today to see if pt is able to continue spironolactone 25mg  daily (K 2 weeks ago was 5.3). If K has not improved, will need to stop spironolactone and could start chlorthalidone 12.5mg  daily for BP control and to help lower K. Can also consider low dose minoxidil 5mg  daily as this is the only other BP medication patient has not tried. Pt has f/u with Dr Tenny Craw next week and will be out of town the following week.    Mubashir Mallek E. Rayelle Armor, PharmD, CPP, BCACP [facility] [address] [place], [place] [address] Phone: [phone]; Fax: [phone] 03/02/2017 7:15 AM

## 2017-03-02 NOTE — Telephone Encounter (Signed)
I spoke to [place] at [facility] and told her we changed the rx. She said she will explain the change to them and why.

## 2017-03-03 ENCOUNTER — Other Ambulatory Visit: Payer: Self-pay | Admitting: Internal Medicine

## 2017-03-03 ENCOUNTER — Telehealth: Payer: Self-pay | Admitting: Pharmacist

## 2017-03-03 DIAGNOSIS — I10 Essential (primary) hypertension: Secondary | ICD-10-CM

## 2017-03-03 LAB — BASIC METABOLIC PANEL
BUN / CREAT RATIO: 20 (ref 10–24)
BUN: 22 mg/dL (ref 8–27)
CO2: 22 mmol/L (ref 20–29)
CREATININE: 1.1 mg/dL (ref 0.76–1.27)
Calcium: 9.3 mg/dL (ref 8.6–10.2)
Chloride: 105 mmol/L (ref 96–106)
GFR calc Af Amer: 70 mL/min/{1.73_m2} (ref >59)
GFR calc non Af Amer: 61 mL/min/{1.73_m2} (ref >59)
Glucose: 90 mg/dL (ref 65–99)
Potassium: 4.8 mmol/L (ref 3.5–5.2)
SODIUM: 141 mmol/L (ref 134–144)

## 2017-03-03 MED ORDER — MINOXIDIL 10 MG PO TABS: ORAL_TABLET | ORAL | 11 refills | Status: DC

## 2017-03-03 MED FILL — traMADol HCL 50 MG TABS: 50 | 30 days supply | Qty: 90 | Fill #0

## 2017-03-03 MED FILL — METOCLOPRAMIDE 5 MG TABLET: 5 | 30 days supply | Qty: 30 | Fill #2

## 2017-03-03 MED FILL — MINOXIDIL 10 MG TABLET: 10 | 30 days supply | Qty: 30 | Fill #0

## 2017-03-03 MED FILL — AMLODIPINE BESYLATE 5 MG TA: 5 | 90 days supply | Qty: 180 | Fill #0

## 2017-03-03 NOTE — Telephone Encounter (Signed)
Spoke

## 2017-03-03 NOTE — Telephone Encounter (Signed)
BMET yesterday showed improved K from 5.3 to 4.8 on spironolactone 25mg  daily. Will continue spironolactone 25mg  daily and start minoxidil 5mg  daily. Rx sent in for 10mg  tablets (only available in 2.5mg  and 10mg  tablets) for patient to start taking 1/2 tablet daily. Will likely need further titration next week at f/u visit with Dr Harrington Challenger.

## 2017-03-10 ENCOUNTER — Telehealth: Payer: Self-pay | Admitting: Pharmacist

## 2017-03-10 NOTE — Telephone Encounter (Signed)
Pt wife called to report BP measurements that have been running high it was 202/108. He took an extra clonidine to help and pressures came down to 180s/90s. He denies any symptoms, but does feel anxious about his upcoming vacation. Advised he increase minoxidil to 10mg  daily and keep follow up as schedule with Dr. Harrington Challenger on Friday. She states understanding and appreciation.

## 2017-03-12 ENCOUNTER — Ambulatory Visit (INDEPENDENT_AMBULATORY_CARE_PROVIDER_SITE_OTHER): Payer: PPO | Admitting: Internal Medicine

## 2017-03-12 ENCOUNTER — Encounter: Payer: Self-pay | Admitting: Internal Medicine

## 2017-03-12 ENCOUNTER — Other Ambulatory Visit: Payer: PPO

## 2017-03-12 VITALS — BP 178/62 | HR 71 | Ht 71.0 in | Wt 172.0 lb

## 2017-03-12 DIAGNOSIS — I6523 Occlusion and stenosis of bilateral carotid arteries: Secondary | ICD-10-CM

## 2017-03-12 DIAGNOSIS — I10 Essential (primary) hypertension: Secondary | ICD-10-CM

## 2017-03-12 DIAGNOSIS — E782 Mixed hyperlipidemia: Secondary | ICD-10-CM

## 2017-03-12 NOTE — Progress Notes (Signed)
Cardiology Office Note   Date:  03/12/2017   ID:  [name], DOB 1929-12-11, MRN [id number]  PCP:  [name], MD  Cardiologist:   [name], MD   F?u of HTN     History of Present Illness: [name] is a 84 y.o. male with a history of HTN, reactive airway dz, prior CVA  CV dz (s/o L CEA), HL   I saw him back in January 2019  He had been doing very well until this past summer when methydopa became difficult to get due to shortages from the company, forcing the use of other medds  The change has been challenging    He has also been seen in HTN clnic by [name] and [name] He was last seen on 03/02/17  Note he has mutl medicine intolerances   The patient brings in list of BPs  The numbers are all over the plce, most in 130s to 160s  Some 110s  Some higher His breathing is OK  He denies CP       Outpatient Medications Prior to Visit  Medication Sig Dispense Refill  . albuterol (PROVENTIL HFA;VENTOLIN HFA) 108 (90 Base) MCG/ACT inhaler Inhale 1-2 puffs into the lungs every 6 (six) hours as needed for wheezing or shortness of breath. 1 Inhaler 6  . amLODipine (NORVASC) 5 MG tablet TAKE 1 TABLET BY MOUTH TWICE A DAY 180 tablet 3  . aspirin EC 325 MG tablet Take 325 mg by mouth daily.    [name] Kitchen atorvastatin (LIPITOR) 40 MG tablet TAKE 1 TABLET BY MOUTH ONCE DAILY 90 tablet 0  . cholecalciferol (VITAMIN D) 1000 UNITS tablet Take 1,000 Units by mouth daily.    . cloNIDine (CATAPRES) 0.1 MG tablet Take 1 tablet (0.1 mg total) by mouth 3 (three) times daily. 60 tablet 11  . clopidogrel (PLAVIX) 75 MG tablet TAKE 1 TABLET BY MOUTH ONCE DAILY (Patient taking differently: TAKE 1 TABLET (75 MG) BY MOUTH ONCE DAILY) 90 tablet 3  . docusate sodium (COLACE) 100 MG capsule Take 100 mg by mouth 2 (two) times daily as needed (constipation).     [name] Kitchen FLUoxetine (PROZAC) 10 MG tablet Take 1 tablet (10 mg total) by mouth daily. 30 tablet 3  . fluticasone (FLONASE) 50 MCG/ACT nasal spray PLACE 2  SPRAYS INTO BOTH NOSTRILS DAILY. (Patient taking differently: PLACE 2 SPRAYS INTO BOTH NOSTRILS DAILY AS NEEDED FOR CONGESTION) 16 g 6  . gabapentin (NEURONTIN) 300 MG capsule Take 1 capsule (300 mg total) by mouth 3 (three) times daily. 270 capsule 1  . metoCLOPramide (REGLAN) 5 MG tablet TAKE 1 TABLET BY MOUTH AT BEDTIME (Patient taking differently: TAKE 1 TABLET (5 MG) BY MOUTH AT BEDTIME) 30 tablet 2  . minoxidil (LONITEN) 10 MG tablet Take 1/2 to 1 tablet daily as directed. 30 tablet 11  . pantoprazole (PROTONIX) 40 MG tablet Take 1 tablet (40 mg total) by mouth daily before breakfast. 90 tablet 3  . polyethylene glycol (MIRALAX / GLYCOLAX) packet Take 17 g by mouth daily as needed for mild constipation. Mix in 8 oz liquid and drink    . polyvinyl alcohol (ARTIFICIAL TEARS) 1.4 % ophthalmic solution Place 1 drop into both eyes daily as needed for dry eyes.    [name] Kitchen spironolactone (ALDACTONE) 50 MG tablet Take 0.5 tablets (25 mg total) by mouth daily. 30 tablet 11  . traMADol (ULTRAM) 50 MG tablet Take 1 tablet (50 mg total) by mouth every 8 (  eight) hours as needed. 90 tablet 0  . triamcinolone cream (KENALOG) 0.5 % Apply 1 application topically daily as needed (for skin irritation).     . Vitamin D, Ergocalciferol, (DRISDOL) 50000 units CAPS capsule Take 1 capsule (50,000 Units total) by mouth every 7 (seven) days. (Patient taking differently: Take 50,000 Units by mouth every Friday. ) 4 capsule 8   No facility-administered medications prior to visit.      Allergies:   Diovan [valsartan]; Hctz [hydrochlorothiazide]; Hydralazine; Metformin and related; Codeine; and Oxycodone-acetaminophen   Past Medical History:  Diagnosis Date  . Acute bronchitis 06/10/2015  . Allergic rhinitis   . Anemia   . Asthma   . Carotid stenosis    a. s/p Left CEA 2002;  b. Carotid US 3/16:  patent R CEA, L < 40%  . Chronic pain syndrome    Left shoulder, back  . Diverticulosis   . Dyslipidemia   . Epigastric  pain 04/05/2016  . Esophageal stricture    Distal, benign  . GERD (gastroesophageal reflux disease)   . Hemorrhoids   . HTN (hypertension)    Negative renal duplex 07-22-11  . Hx of echocardiogram    a. Echo 10/11: mod LVH, EF 55-60%  . Pneumonia 07-2013  . Prostate cancer ([place]) 2000   Seed XRT  . Spondylosis, cervical, with myelopathy 11/26/2015  . Stroke [facility]) 8/01   right thalamic - on chronic Plavix/ASA    Past Surgical History:  Procedure Laterality Date  . APPENDECTOMY  1953  . CAROTID ENDARTERECTOMY Right 01/2000  . CATARACT EXTRACTION Bilateral   . CERVICAL DISC SURGERY  8/07  . INSERTION PROSTATE RADIATION SEED  2000  . [place] SURGERY  1990s  . NASAL SINUS SURGERY     x 3     Social History:  The patient  reports that he quit smoking about 39 years ago. His smoking use included cigarettes. He has a 8.75 pack-year smoking history. His smokeless tobacco use includes chew. He reports that he does not drink alcohol or use drugs.   Family History:  The patient's family history includes Alcohol abuse in his brother; Cancer in his brother; Diabetes in his son; Gout in his son; Hyperlipidemia in his mother; Hypertension in his mother; Lung disease in his father; Obesity in his sister; Stroke in his brother, mother, and sister.    ROS:  Please see the history of present illness. All other systems are reviewed and  Negative to the above problem except as noted.    PHYSICAL EXAM: VS:  BP (!) 178/62 Comment: 180/60 left arm 2nd reading  Pulse 71   Ht 5\' 11"  (1.803 m)   Wt 172 lb (78 kg)   SpO2 95%   BMI 23.99 kg/m    BP is 165/ on my check   (R arm  Wife reports BP is lower in L)   GEN: Well nourished, well developed, in no acute distress  HEENT: normal  Neck: JVP is normal  carotid bruits, or masses Cardiac: RRR; no murmurs, rubs, or gallops, No edema Respiratory:  Moving air   No wheezes    GI: soft, nontender, nondistended, + BS  No hepatomegaly  MS: no  deformity Moving all extremities   Skin: warm and dry, no rash  Scab on L shin   Neuro:  Strength and sensation are intact Psych: euthymic mood, full affect   EKG:  EKG is not ordered today    Lipid Panel    Component  Value Date/Time   CHOL 103 12/08/2016 1448   TRIG 80.0 12/08/2016 1448   HDL 39.00 (L) 12/08/2016 1448   CHOLHDL 3 12/08/2016 1448   VLDL 16.0 12/08/2016 1448   LDLCALC 48 12/08/2016 1448      Wt Readings from Last 3 Encounters:  03/12/17 172 lb (78 kg)  01/20/17 166 lb 3.2 oz (75.4 kg)  12/27/16 166 lb (75.3 kg)      ASSESSMENT AND PLAN 1   HTN  BP is sstill high  It was increased to 10 a couple days ago  Still early for full effect   I would continue    The pt says he has had some consitipation since clonidine added   I ;would hold then on increasing     He plans to go to [employer]    I think this is OK I spoke to [employer]   She has contacted maker of methydopa   Will wit to hear back from then to see if a supply can be found    Will set up for Renal artery duplex to confirm no change in blood supply that is making control difficult    2  HL  Continue lipitor    4  CV dz  Follows with T Early    Pt will follow up in HTN clinic in 3 wks    5  Bradycardia  Unchanged  Asymptomatic  Follow   Signed, [name], MD  03/12/2017 3:[address], [place], [place]  [address] Phone: [phone]; Fax: [phone]

## 2017-03-12 NOTE — Patient Instructions (Addendum)
Your physician recommends that you continue on your current medications as directed. Please refer to the Current Medication list given to you today.  Your physician has requested that you have a renal artery duplex. During this test, an ultrasound is used to evaluate blood flow to the kidneys. Allow one hour for this exam. Do not eat after midnight the day before and avoid carbonated beverages. Take your medications as you usually do.  Your physician recommends that you schedule a follow-up appointment in: 3 weeks in hypertension clinic

## 2017-03-23 ENCOUNTER — Telehealth: Payer: Self-pay | Admitting: Pharmacist

## 2017-03-23 NOTE — Telephone Encounter (Signed)
Pt called to report swelling since increasing minoxidil to 10mg  daily. Pt is currently in [place] on vacation. Will decrease back to 5mg  daily. Advised that pt can take an extra clonidine 0.1mg  if needed. Discussed ROX clinical trial as an option since pt has had intolerances to all blood pressure medications. He is interested in learning more. Will forward charts over to [facility] to screen patient for ROX trial.   Current BP meds: -Amlodipine 5mg  BID -Clonidine 0.1mg  TID - take an additional tab if needed -Spironolactone 25mg  daily  HTN intolerances listed below: -Valsartan (hyperkalemia, possible angioedema) -HCTZ (swelling and dyspnea) -Clonidine patch (red arm) -Beta blockers (baseline bradycardia) -Methyldopa (drug shortage) -Hydralazine (abdominal pain, nausea, chest pain, trouble breathing) -Spironolactone 50mg  (hyperkalemia) -Minoxidil 10mg  (swelling)

## 2017-03-30 ENCOUNTER — Ambulatory Visit (HOSPITAL_COMMUNITY)
Admission: RE | Admit: 2017-03-30 | Payer: PPO | Source: Ambulatory Visit | Attending: Internal Medicine | Admitting: Internal Medicine

## 2017-03-30 ENCOUNTER — Other Ambulatory Visit: Payer: Self-pay | Admitting: Family Medicine

## 2017-03-30 DIAGNOSIS — M549 Dorsalgia, unspecified: Secondary | ICD-10-CM

## 2017-04-01 ENCOUNTER — Telehealth: Payer: Self-pay | Admitting: Family Medicine

## 2017-04-01 NOTE — Telephone Encounter (Signed)
It has been a year since a contract and UDS and a contract. I have sent in #30 of the Tramadol but he needs to come in and do a UDS and contract for more. He will also need an appt by May to keep getting refills on his pain meds

## 2017-04-01 NOTE — Telephone Encounter (Signed)
Requesting:  ULTRAM  50 MG Contract: 03/30/16 UDS:  03/30/16 Low Risk Last OV: 12/01/16 Next OV:  - Last Refill: 02/02/17  #90   Please advise

## 2017-04-01 NOTE — Telephone Encounter (Signed)
Copied from [place]. Topic: Quick Communication - Rx Refill/Question >> Apr 01, 2017  1:20 PM Lolita Rieger, [place] wrote: Medication: tramadol 50 mg Has the patient contacted their pharmacy? yes (Agent: If no, request that the patient contact the pharmacy for the refill.) Preferred Pharmacy (with phone number or street name): [facility] Agent: Please be advised that RX refills may take up to 3 business days. We ask that you follow-up with your pharmacy.

## 2017-04-02 MED FILL — MINOXIDIL 10 MG TABLET: 10 | 30 days supply | Qty: 30 | Fill #1

## 2017-04-02 MED FILL — traMADol HCL 50 MG TABS: 50 | 10 days supply | Qty: 30 | Fill #0

## 2017-04-02 NOTE — Telephone Encounter (Signed)
The patient would like to know why the Tramadol medication was sent in for 30 tablets instead of 90 like he always get.

## 2017-04-04 MED FILL — GABAPENTIN 300 MG CAPSULE: 300 | 90 days supply | Qty: 270 | Fill #1

## 2017-04-05 ENCOUNTER — Telehealth: Payer: Self-pay | Admitting: Internal Medicine

## 2017-04-05 ENCOUNTER — Other Ambulatory Visit: Payer: Self-pay | Admitting: Internal Medicine

## 2017-04-05 NOTE — Telephone Encounter (Signed)
Below BPs from yesterday and today.   Running high more at night. Took 4 clonidine as Sunday. Has been taking minoxidil whole tablet, minimal swelling. Would like to discuss with Megan.  2:30 pm today 164/68. Last night at 212/78 right arm 200/80 left arm. Sat night 180/70. Took 3 clonidine. Pt gets agitated when it is up per his wife.   Cancelled appointment for tomorrow because there are a lot of bills from all the recent visits.  Wonders now if she should have kept appointment.  The whole time they were out of town pt refused to have blood pressure checked.

## 2017-04-05 NOTE — Telephone Encounter (Signed)
Called patient left vmail about making appt.

## 2017-04-05 NOTE — Telephone Encounter (Signed)
May I refill Sir? 

## 2017-04-05 NOTE — Telephone Encounter (Signed)
New message  Patient spouse calling , requesting to speak with Megan Supple.     Pt c/o medication issue:  1. Name of Medication: minoxidil (LONITEN) 10 MG tablet  2. How are you currently taking this medication (dosage and times per day)? Take 0.5 tablets (5 mg total) by mouth daily.  3. Are you having a reaction (difficulty breathing--STAT)? No  4. What is your medication issue? BP concerns   Pt c/o BP issue: STAT if pt c/o blurred vision, one-sided weakness or slurred speech  1. What are your last 5 BP readings? 164/68, 200/80, 212/78  2. Are you having any other symptoms (ex. Dizziness, headache, blurred vision, passed out)? Headache last night  3. What is your BP issue? BP controlled

## 2017-04-05 NOTE — Telephone Encounter (Signed)
Left message to call me back.

## 2017-04-05 NOTE — Telephone Encounter (Signed)
We can refill again for 2-3 months but Since this medication can cause neurologic damage and abnormal tongue and lip move,ments I typically have people see me to continue getting Rxs Please explain this to wife  So he should get a next available appt w/ me vs him/her attesting that he does not have any abnormal tongue or lip movements or problems from the medication such as nerve pain/neuropathy sxs  Iff she (is an Therapist, sports) thinks not I will refill x 1 year or otherwise he will need to see me to get a longer Rx

## 2017-04-06 ENCOUNTER — Ambulatory Visit: Payer: PPO

## 2017-04-06 ENCOUNTER — Telehealth: Payer: Self-pay

## 2017-04-06 MED ORDER — CLONIDINE HCL 0.1 MG PO TABS: ORAL_TABLET | ORAL | 11 refills | Status: DC

## 2017-04-06 MED FILL — METOCLOPRAMIDE 5 MG TABLET: 5 | 30 days supply | Qty: 30 | Fill #0

## 2017-04-06 MED FILL — CloNIDine HCL 0.1 MG TAB: 0.1 | 30 days supply | Qty: 120 | Fill #0

## 2017-04-06 NOTE — Telephone Encounter (Signed)
I spoke with [name] and he said he has had no adverse reactions to the medicine.  He reports that it has helped him a lot. I have sent in the approved refill and he will call should any problems arise.

## 2017-04-06 NOTE — Telephone Encounter (Signed)
Copied from [place] [id number]. Topic: Quick Communication - Office Called Patient >> Apr 05, 2017  9:14 AM Magdalene Molly, RMA wrote: Reason for CRM: Called and let patient know per Dr. Charlett Blake It has been a year since a contract and UDS has been sone.  She sent in #30 of the Tramadol but he needs to come in and do a UDS and contract for more. He will also need an appt by May to keep getting refills on his pain meds. >> Apr 05, 2017  3:42 PM Oneta Rack wrote: Relation to pt: self Call back number: [phone]   Reason for call:  Patient scheduled follow up appointment for 04/20/17 and would like PCP to refill traMADol (ULTRAM) 50 MG tablet until appointment, patient states he takes 3x daily and only has 8 pills left, please advise

## 2017-04-06 NOTE — Telephone Encounter (Signed)
Returned call to pt - reportsBP is higher at night near 500 systolic, improved during the day in the 160s. Taking an extra clonidine has been helping pt. Advised him to take 4 clonidine each day if needed. Research is reaching out to pt on Thursday to discuss ROX trial.

## 2017-04-09 ENCOUNTER — Ambulatory Visit (HOSPITAL_COMMUNITY)
Admission: RE | Admit: 2017-04-09 | Discharge: 2017-04-09 | Disposition: A | Discharge status: Home / Self Care | Payer: PPO | Source: Ambulatory Visit | Attending: Cardiology | Admitting: Cardiology

## 2017-04-09 DIAGNOSIS — Z8673 Personal history of transient ischemic attack (TIA), and cerebral infarction without residual deficits: Secondary | ICD-10-CM | POA: Insufficient documentation

## 2017-04-09 DIAGNOSIS — E785 Hyperlipidemia, unspecified: Secondary | ICD-10-CM | POA: Insufficient documentation

## 2017-04-09 DIAGNOSIS — Z87891 Personal history of nicotine dependence: Secondary | ICD-10-CM | POA: Diagnosis not present

## 2017-04-09 DIAGNOSIS — I10 Essential (primary) hypertension: Secondary | ICD-10-CM | POA: Diagnosis not present

## 2017-04-12 ENCOUNTER — Other Ambulatory Visit: Payer: Self-pay | Admitting: Family Medicine

## 2017-04-12 DIAGNOSIS — M549 Dorsalgia, unspecified: Secondary | ICD-10-CM

## 2017-04-12 NOTE — Telephone Encounter (Signed)
Copied from [place] [id number]. Topic: Quick Communication - Rx Refill/Question >> Apr 12, 2017  5:24 PM Oliver Pila B wrote: Medication: traMADol (ULTRAM) 50 MG tablet [[id number]] Has the patient contacted their pharmacy? Yes.   (Agent: If no, request that the patient contact the pharmacy for the refill.) Preferred Pharmacy (with phone number or street name):  Agent: Please be advised that RX refills may take up to 3 business days. We ask that you follow-up with your pharmacy.

## 2017-04-13 ENCOUNTER — Telehealth: Payer: Self-pay | Admitting: Internal Medicine

## 2017-04-13 ENCOUNTER — Other Ambulatory Visit: Payer: Self-pay | Admitting: Family Medicine

## 2017-04-13 DIAGNOSIS — M549 Dorsalgia, unspecified: Secondary | ICD-10-CM

## 2017-04-13 MED ORDER — TRAMADOL HCL 50 MG PO TABS: 50.0000 mg | ORAL_TABLET | Freq: Three times a day (TID) | ORAL | 0 refills | Status: DC | PRN

## 2017-04-13 NOTE — Telephone Encounter (Signed)
LOV: 12/01/16  Dr. Farris [facility]

## 2017-04-13 NOTE — Telephone Encounter (Signed)
Request refill on Tramadol until office visit on 04/20/17. Last office visit 12/01/16 Last UDS 03/31/16 Last refill 04/01/17; #30; no RF PCP: Dr. Charlett Blake Pharmacy: [facility]

## 2017-04-13 NOTE — Telephone Encounter (Signed)
New message   Patient calling :Result Notes for VAS US RENAL ARTERY DUPLEX

## 2017-04-13 NOTE — Telephone Encounter (Signed)
Spoke with the patient and informed him of Renal US results..  He verbalized understanding.[name] Kitchen

## 2017-04-13 NOTE — Telephone Encounter (Signed)
Patient checking on status of traMADol (ULTRAM) 50 MG tablet.

## 2017-04-13 NOTE — Telephone Encounter (Signed)
Please see below request:  CSC:  03/30/16. Needs updated contract. UDS: 03/30/16. Low risk and due for repeat now CSR:  No discrepancies identified Next OV:  04/20/17. Can UDS and Contract be renewed at that visit?

## 2017-04-14 MED FILL — traMADol HCL 50 MG TABS: 50 | 10 days supply | Qty: 30 | Fill #0

## 2017-04-14 NOTE — Telephone Encounter (Signed)
I already sent this to [facility] on 04/13/2017

## 2017-04-15 NOTE — Telephone Encounter (Signed)
Spoke with patient he stated he picked up meds on 04/14/17

## 2017-04-15 NOTE — Telephone Encounter (Signed)
Duplicate request. Denial sent to pharmacy.

## 2017-04-15 NOTE — Telephone Encounter (Signed)
Will call patient and let him know about rx at pharmacy

## 2017-04-20 ENCOUNTER — Encounter (HOSPITAL_COMMUNITY): Payer: PPO

## 2017-04-20 ENCOUNTER — Ambulatory Visit (INDEPENDENT_AMBULATORY_CARE_PROVIDER_SITE_OTHER): Payer: PPO | Admitting: Family Medicine

## 2017-04-20 ENCOUNTER — Encounter: Payer: Self-pay | Admitting: Family Medicine

## 2017-04-20 ENCOUNTER — Ambulatory Visit: Payer: PPO | Admitting: Vascular Surgery

## 2017-04-20 VITALS — BP 170/62 | HR 60 | Temp 98.1°F | Resp 16 | Ht 70.87 in | Wt 171.6 lb

## 2017-04-20 DIAGNOSIS — I10 Essential (primary) hypertension: Secondary | ICD-10-CM | POA: Diagnosis not present

## 2017-04-20 DIAGNOSIS — E782 Mixed hyperlipidemia: Secondary | ICD-10-CM | POA: Diagnosis not present

## 2017-04-20 DIAGNOSIS — Z79899 Other long term (current) drug therapy: Secondary | ICD-10-CM

## 2017-04-20 DIAGNOSIS — R739 Hyperglycemia, unspecified: Secondary | ICD-10-CM

## 2017-04-20 DIAGNOSIS — I6350 Cerebral infarction due to unspecified occlusion or stenosis of unspecified cerebral artery: Secondary | ICD-10-CM

## 2017-04-20 DIAGNOSIS — G8929 Other chronic pain: Secondary | ICD-10-CM

## 2017-04-20 DIAGNOSIS — E559 Vitamin D deficiency, unspecified: Secondary | ICD-10-CM | POA: Diagnosis not present

## 2017-04-20 DIAGNOSIS — M549 Dorsalgia, unspecified: Secondary | ICD-10-CM | POA: Diagnosis not present

## 2017-04-20 MED ORDER — TRAMADOL HCL 50 MG PO TABS
50.0000 mg | ORAL_TABLET | Freq: Three times a day (TID) | ORAL | 0 refills | Status: DC | PRN
Start: 2017-04-20 — End: 2017-05-11

## 2017-04-20 MED ORDER — ATORVASTATIN CALCIUM 40 MG PO TABS: 40.0000 mg | ORAL_TABLET | Freq: Every day | ORAL | 1 refills | Status: DC

## 2017-04-20 NOTE — Assessment & Plan Note (Signed)
Poorly controlled will alter medications, encouraged DASH diet, minimize caffeine and obtain adequate sleep. Report concerning symptoms and follow up as directed and as needed. Is following with cardiology closely but having trouble controlling

## 2017-04-20 NOTE — Assessment & Plan Note (Signed)
hgba1c acceptable, minimize simple carbs. Increase exercise as tolerated. Continue current meds 

## 2017-04-20 NOTE — Progress Notes (Signed)
Subjective:  I acted as a Education administrator for Dr. Charlett Blake. [name], [place]  Patient ID: [name], male    DOB: 11-12-1929, 84 y.o.   MRN: [id number]  No chief complaint on file.   HPI  Patient is in today for a follow up and he is accompanied by his wife. He feels well today. No recent febrile illness or hospitalizations. Is eating well and staying active. He continues to have back pain and he manages it well with Tramadol prn. No concerning side effects. Denies CP/palp/SOB/HA/congestion/fevers/GI or GU c/o. Taking meds as prescribed  Patient Care Team: [name], MD as PCP - General (Family Medicine) Fay Records, MD (Cardiology) Early, Arvilla Meres, MD (Vascular Surgery) Katy Apo, MD (Ophthalmology) Harriett Sine, MD as Consulting Physician (Dermatology)   Past Medical History:  Diagnosis Date  . Acute bronchitis 06/10/2015  . Allergic rhinitis   . Anemia   . Asthma   . Carotid stenosis    a. s/p Left CEA 2002;  b. Carotid US 3/16:  patent R CEA, L < 40%  . Chronic pain syndrome    Left shoulder, back  . Diverticulosis   . Dyslipidemia   . Epigastric pain 04/05/2016  . Esophageal stricture    Distal, benign  . GERD (gastroesophageal reflux disease)   . Hemorrhoids   . HTN (hypertension)    Negative renal duplex 07-22-11  . Hx of echocardiogram    a. Echo 10/11: mod LVH, EF 55-60%  . Pneumonia 07-2013  . Prostate cancer ([place]) 2000   Seed XRT  . Spondylosis, cervical, with myelopathy 11/26/2015  . 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 APPENDECTOMY  1953  . CAROTID ENDARTERECTOMY Right 01/2000  . CATARACT EXTRACTION Bilateral   . CERVICAL DISC SURGERY  8/07  . INSERTION PROSTATE RADIATION SEED  2000  . [place] SURGERY  1990s  . NASAL SINUS SURGERY     x 3    Family History  Problem Relation Age of Onset  . Stroke Brother   . Stroke Sister   . Stroke Mother   . Hyperlipidemia  Mother   . Hypertension Mother   . Lung disease Father   . Diabetes Son   . Gout Son   . Obesity Sister   . Alcohol abuse Brother   . Cancer Brother        lung, smoker    Social History   Socioeconomic History  . Marital status: Married    Spouse name: Not on file  . Number of children: 1  . Years of education: 47  . Highest education level: Not on file  Occupational History  . Occupation: Retired  Scientific laboratory technician  . Financial resource strain: Not on file  . Food insecurity:    Worry: Not on file    Inability: Not on file  . Transportation needs:    Medical: Not on file    Non-medical: Not on file  Tobacco Use  . Smoking status: Former Smoker    Packs/day: 0.25    Years: 35.00    Pack years: 8.75    Types: Cigarettes    Last attempt to quit: 01/12/1978    Years since quitting: 39.3  . Smokeless tobacco: Current User    Types: Chew  Substance and Sexual Activity  . Alcohol use: No    Comment: Prior alcoholic, sober since 3546  . Drug  use: No  . Sexual activity: Not Currently  Lifestyle  . Physical activity:    Days per week: Not on file    Minutes per session: Not on file  . Stress: Not on file  Relationships  . Social connections:    Talks on phone: Not on file    Gets together: Not on file    Attends religious service: Not on file    Active member of club or organization: Not on file    Attends meetings of clubs or organizations: Not on file    Relationship status: Not on file  . Intimate partner violence:    Fear of current or ex partner: Not on file    Emotionally abused: Not on file    Physically abused: Not on file    Forced sexual activity: Not on file  Other Topics Concern  . Not on file  Social History Narrative   Lives at home w/ his wife   Right-handed    Caffeine: drinks a lot of coffee per report    Outpatient Medications Prior to Visit  Medication Sig Dispense Refill  . albuterol (PROVENTIL HFA;VENTOLIN HFA) 108 (90 Base) MCG/ACT  inhaler Inhale 1-2 puffs into the lungs every 6 (six) hours as needed for wheezing or shortness of breath. 1 Inhaler 6  . amLODipine (NORVASC) 5 MG tablet TAKE 1 TABLET BY MOUTH TWICE A DAY 180 tablet 3  . aspirin EC 325 MG tablet Take 325 mg by mouth daily.    . cholecalciferol (VITAMIN D) 1000 UNITS tablet Take 1,000 Units by mouth daily.    . cloNIDine (CATAPRES) 0.1 MG tablet Take 1 tablet by mouth 3 to 4 times by mouth each day. 120 tablet 11  . clopidogrel (PLAVIX) 75 MG tablet TAKE 1 TABLET BY MOUTH ONCE DAILY (Patient taking differently: TAKE 1 TABLET (75 MG) BY MOUTH ONCE DAILY) 90 tablet 3  . docusate sodium (COLACE) 100 MG capsule Take 100 mg by mouth 2 (two) times daily as needed (constipation).     [name] Kitchen FLUoxetine (PROZAC) 10 MG tablet Take 1 tablet (10 mg total) by mouth daily. 30 tablet 3  . fluticasone (FLONASE) 50 MCG/ACT nasal spray PLACE 2 SPRAYS INTO BOTH NOSTRILS DAILY. (Patient taking differently: PLACE 2 SPRAYS INTO BOTH NOSTRILS DAILY AS NEEDED FOR CONGESTION) 16 g 6  . gabapentin (NEURONTIN) 300 MG capsule Take 1 capsule (300 mg total) by mouth 3 (three) times daily. 270 capsule 1  . metoCLOPramide (REGLAN) 5 MG tablet TAKE 1 TABLET (5 MG) BY MOUTH AT BEDTIME 30 tablet 11  . minoxidil (LONITEN) 10 MG tablet Take 10 mg by mouth daily. 30 tablet 11  . pantoprazole (PROTONIX) 40 MG tablet Take 1 tablet (40 mg total) by mouth daily before breakfast. 90 tablet 3  . polyethylene glycol (MIRALAX / GLYCOLAX) packet Take 17 g by mouth daily as needed for mild constipation. Mix in 8 oz liquid and drink    . polyvinyl alcohol (ARTIFICIAL TEARS) 1.4 % ophthalmic solution Place 1 drop into both eyes daily as needed for dry eyes.    [name] Kitchen spironolactone (ALDACTONE) 50 MG tablet Take 0.5 tablets (25 mg total) by mouth daily. 30 tablet 11  . triamcinolone cream (KENALOG) 0.5 % Apply 1 application topically daily as needed (for skin irritation).     . Vitamin D, Ergocalciferol, (DRISDOL) 50000  units CAPS capsule Take 1 capsule (50,000 Units total) by mouth every 7 (seven) days. (Patient taking differently: Take 50,000 Units by mouth  every Friday. ) 4 capsule 8  . atorvastatin (LIPITOR) 40 MG tablet TAKE 1 TABLET BY MOUTH ONCE DAILY 90 tablet 0  . traMADol (ULTRAM) 50 MG tablet Take 1 tablet (50 mg total) by mouth every 8 (eight) hours as needed. 30 tablet 0   No facility-administered medications prior to visit.     Allergies  Allergen Reactions  . Diovan [Valsartan] Other (See Comments)    Elevated potassium hyperkalemia  . Hctz [Hydrochlorothiazide] Other (See Comments)    Swelling and dyspnea   . Hydralazine     Chest pain, GI issues, shortness of breath  . Metformin And Related Other (See Comments)    Unknown reaction  . Codeine Nausea Only  . Oxycodone-Acetaminophen Nausea Only    Review of Systems  Constitutional: Negative for fever and malaise/fatigue.  HENT: Negative for congestion.   Eyes: Negative for blurred vision.  Respiratory: Negative for shortness of breath.   Cardiovascular: Negative for chest pain, palpitations and leg swelling.  Gastrointestinal: Negative for abdominal pain, blood in stool and nausea.  Genitourinary: Negative for dysuria and frequency.  Musculoskeletal: Positive for back pain. Negative for falls.  Skin: Negative for rash.  Neurological: Negative for dizziness, loss of consciousness and headaches.  Endo/Heme/Allergies: Negative for environmental allergies.  Psychiatric/Behavioral: Negative for depression. The patient is not nervous/anxious.        Objective:    Physical Exam  Constitutional: He is oriented to person, place, and time. No distress.  HENT:  Head: Normocephalic and atraumatic.  Eyes: Conjunctivae are normal.  Neck: Neck supple. No thyromegaly present.  Cardiovascular: Normal rate, regular rhythm and normal heart sounds.  No murmur heard. Pulmonary/Chest: Effort normal and breath sounds normal. No respiratory  distress.  Abdominal: He exhibits no distension and no mass. There is no tenderness.  Musculoskeletal: He exhibits no edema.  Neurological: He is alert and oriented to person, place, and time.  Skin: Skin is warm.  Psychiatric: Judgment normal.    BP (!) 170/62 (BP Location: Left Arm, Patient Position: Sitting, Cuff Size: Normal)   Pulse 60   Temp 98.1 F (36.7 C) (Oral)   Resp 16   Ht 5' 10.87" (1.8 m)   Wt 171 lb 9.6 oz (77.8 kg)   SpO2 96%   BMI 24.02 kg/m  Wt Readings from Last 3 Encounters:  04/20/17 171 lb 9.6 oz (77.8 kg)  03/12/17 172 lb (78 kg)  01/20/17 166 lb 3.2 oz (75.4 kg)   BP Readings from Last 3 Encounters:  04/20/17 (!) 170/62  03/12/17 (!) 178/62  03/02/17 (!) 172/74     Immunization History  Administered Date(s) Administered  . Influenza Split 09/12/2012  . Influenza Whole 11/04/2006, 10/11/2008, 09/24/2011  . Influenza, High Dose Seasonal PF 10/08/2015  . Influenza-Unspecified 10/05/2013, 10/13/2014  . Pneumococcal Conjugate-13 10/05/2013  . Pneumococcal Polysaccharide-23 01/13/2003, 12/10/2014  . Tetanus 12/12/2013  . Zoster 11/24/2011    Health Maintenance  Topic Date Due  . INFLUENZA VACCINE  08/12/2017  . TETANUS/TDAP  12/13/2023  . PNA vac Low Risk Adult  Completed    Lab Results  Component Value Date   WBC 9.1 12/27/2016   HGB 13.4 12/27/2016   HCT 39.3 12/27/2016   PLT 213 12/27/2016   GLUCOSE 90 03/02/2017   CHOL 103 12/08/2016   TRIG 80.0 12/08/2016   HDL 39.00 (L) 12/08/2016   LDLCALC 48 12/08/2016   ALT 8 12/08/2016   AST 13 12/08/2016   NA 141 03/02/2017   K 4.8  03/02/2017   CL 105 03/02/2017   CREATININE 1.10 03/02/2017   BUN 22 03/02/2017   CO2 22 03/02/2017   TSH 2.46 03/30/2016   PSA 0.02 (L) 12/11/2008   INR 0.97 02/06/2010   HGBA1C 5.5 12/08/2016    Lab Results  Component Value Date   TSH 2.46 03/30/2016   Lab Results  Component Value Date   WBC 9.1 12/27/2016   HGB 13.4 12/27/2016   HCT 39.3  12/27/2016   MCV 90.8 12/27/2016   PLT 213 12/27/2016   Lab Results  Component Value Date   NA 141 03/02/2017   K 4.8 03/02/2017   CO2 22 03/02/2017   GLUCOSE 90 03/02/2017   BUN 22 03/02/2017   CREATININE 1.10 03/02/2017   BILITOT 0.7 12/08/2016   ALKPHOS 55 12/08/2016   AST 13 12/08/2016   ALT 8 12/08/2016   PROT 6.1 12/08/2016   ALBUMIN 3.7 12/08/2016   CALCIUM 9.3 03/02/2017   ANIONGAP 8 12/27/2016   GFR 76.31 12/08/2016   Lab Results  Component Value Date   CHOL 103 12/08/2016   Lab Results  Component Value Date   HDL 39.00 (L) 12/08/2016   Lab Results  Component Value Date   LDLCALC 48 12/08/2016   Lab Results  Component Value Date   TRIG 80.0 12/08/2016   Lab Results  Component Value Date   CHOLHDL 3 12/08/2016   Lab Results  Component Value Date   HGBA1C 5.5 12/08/2016         Assessment & Plan:   Problem List Items Addressed This Visit    Vitamin D deficiency   Relevant Medications   atorvastatin (LIPITOR) 40 MG tablet   Other Relevant Orders   VITAMIN D 25 Hydroxy (Vit-D Deficiency, Fractures)   HLD (hyperlipidemia)    Encouraged heart healthy diet, increase exercise, avoid trans fats, consider a krill oil cap daily      Relevant Medications   atorvastatin (LIPITOR) 40 MG tablet   Other Relevant Orders   Lipid panel   HYPERTENSION, BENIGN ESSENTIAL    Poorly controlled will alter medications, encouraged DASH diet, minimize caffeine and obtain adequate sleep. Report concerning symptoms and follow up as directed and as needed. Is following with cardiology closely but having trouble controlling      Relevant Medications   atorvastatin (LIPITOR) 40 MG tablet   Other Relevant Orders   CBC   Comprehensive metabolic panel   TSH   Cerebral artery occlusion with cerebral infarction (HCC)   Relevant Medications   atorvastatin (LIPITOR) 40 MG tablet   Chronic pain    Manages pain with activity and Tramadol prn. He feels it helps well and  no concerning side effects. UDS and contract UTD      Relevant Medications   traMADol (ULTRAM) 50 MG tablet   Hyperglycemia    hgba1c acceptable, minimize simple carbs. Increase exercise as tolerated. Continue current meds      Relevant Orders   Hemoglobin A1c    Other Visit Diagnoses    High risk medication use    -  Primary   Relevant Orders   Pain Mgmt, Profile 8 w/Conf, U   Pain Mgmt, Tramadol w/medMATCH, U   Hyperlipidemia, mixed       Relevant Medications   atorvastatin (LIPITOR) 40 MG tablet   Essential hypertension       Relevant Medications   atorvastatin (LIPITOR) 40 MG tablet   Back pain, unspecified back location, unspecified back pain laterality, unspecified chronicity  Relevant Medications   traMADol (ULTRAM) 50 MG tablet      I have changed Elyn Aquas. Rodin's atorvastatin. I am also having him maintain his polyethylene glycol, docusate sodium, triamcinolone cream, cholecalciferol, albuterol, fluticasone, Vitamin D (Ergocalciferol), clopidogrel, gabapentin, FLUoxetine, aspirin EC, polyvinyl alcohol, spironolactone, pantoprazole, amLODipine, minoxidil, metoCLOPramide, cloNIDine, and traMADol.  Meds ordered this encounter  Medications  . atorvastatin (LIPITOR) 40 MG tablet    Sig: Take 1 tablet (40 mg total) by mouth daily.    Dispense:  90 tablet    Refill:  1  . traMADol (ULTRAM) 50 MG tablet    Sig: Take 1 tablet (50 mg total) by mouth every 8 (eight) hours as needed.    Dispense:  60 tablet    Refill:  0    Do not fill until April 23 2017    CMA served as Education administrator during this visit. History, Physical and Plan performed by medical provider. Documentation and orders reviewed and attested to.  Penni Homans, MD

## 2017-04-20 NOTE — Patient Instructions (Signed)
Cholesterol Cholesterol is a fat. Your body needs a small amount of cholesterol. Cholesterol (plaque) may build up in your blood vessels (arteries). That makes you more likely to have a heart attack or stroke. You cannot feel your cholesterol level. Having a blood test is the only way to find out if your level is high. Keep your test results. Work with your doctor to keep your cholesterol at a good level. What do the results mean?  Total cholesterol is how much cholesterol is in your blood.  LDL is bad cholesterol. This is the type that can build up. Try to have low LDL.  HDL is good cholesterol. It cleans your blood vessels and carries LDL away. Try to have high HDL.  Triglycerides are fat that the body can store or burn for energy. What are good levels of cholesterol?  Total cholesterol below 200.  LDL below 100 is good for people who have health risks. LDL below 70 is good for people who have very high risks.  HDL above 40 is good. It is best to have HDL of 60 or higher.  Triglycerides below 150. How can I lower my cholesterol? Diet Follow your diet program as told by your doctor.  Choose fish, white meat chicken, or turkey that is roasted or baked. Try not to eat red meat, fried foods, sausage, or lunch meats.  Eat lots of fresh fruits and vegetables.  Choose whole grains, beans, pasta, potatoes, and cereals.  Choose olive oil, corn oil, or canola oil. Only use small amounts.  Try not to eat butter, mayonnaise, shortening, or palm kernel oils.  Try not to eat foods with trans fats.  Choose low-fat or nonfat dairy foods. ? Drink skim or nonfat milk. ? Eat low-fat or nonfat yogurt and cheeses. ? Try not to drink whole milk or cream. ? Try not to eat ice cream, egg yolks, or full-fat cheeses.  Healthy desserts include angel food cake, ginger snaps, animal crackers, hard candy, popsicles, and low-fat or nonfat frozen yogurt. Try not to eat pastries, cakes, pies, and  cookies.  Exercise Follow your exercise program as told by your doctor.  Be more active. Try gardening, walking, and taking the stairs.  Ask your doctor about ways that you can be more active.  Medicine  Take over-the-counter and prescription medicines only as told by your doctor. This information is not intended to replace advice given to you by your health care provider. Make sure you discuss any questions you have with your health care provider. Document Released: 03/27/2008 Document Revised: 07/31/2015 Document Reviewed: 07/11/2015 Elsevier Interactive Patient Education  2018 Elsevier Inc.  

## 2017-04-20 NOTE — Assessment & Plan Note (Signed)
Encouraged heart healthy diet, increase exercise, avoid trans fats, consider a krill oil cap daily 

## 2017-04-21 MED FILL — ATORVASTATIN 40 MG TABLET: 40 | 90 days supply | Qty: 90 | Fill #0

## 2017-04-23 ENCOUNTER — Other Ambulatory Visit: Payer: PPO

## 2017-04-23 MED FILL — traMADol HCL 50 MG TABS: 50 | 20 days supply | Qty: 60 | Fill #0

## 2017-04-24 NOTE — Assessment & Plan Note (Signed)
Manages pain with activity and Tramadol prn. He feels it helps well and no concerning side effects. UDS and contract UTD

## 2017-04-26 ENCOUNTER — Other Ambulatory Visit (INDEPENDENT_AMBULATORY_CARE_PROVIDER_SITE_OTHER): Payer: PPO

## 2017-04-26 DIAGNOSIS — E559 Vitamin D deficiency, unspecified: Secondary | ICD-10-CM | POA: Diagnosis not present

## 2017-04-26 DIAGNOSIS — I10 Essential (primary) hypertension: Secondary | ICD-10-CM | POA: Diagnosis not present

## 2017-04-26 DIAGNOSIS — R739 Hyperglycemia, unspecified: Secondary | ICD-10-CM | POA: Diagnosis not present

## 2017-04-26 DIAGNOSIS — E782 Mixed hyperlipidemia: Secondary | ICD-10-CM

## 2017-04-26 DIAGNOSIS — R7989 Other specified abnormal findings of blood chemistry: Secondary | ICD-10-CM | POA: Diagnosis not present

## 2017-04-26 DIAGNOSIS — Z79899 Other long term (current) drug therapy: Secondary | ICD-10-CM

## 2017-04-26 LAB — COMPREHENSIVE METABOLIC PANEL
ALBUMIN: 3.7 g/dL (ref 3.5–5.2)
ALT: 7 U/L (ref 0–53)
AST: 11 U/L (ref 0–37)
Alkaline Phosphatase: 53 U/L (ref 39–117)
BUN: 17 mg/dL (ref 6–23)
CALCIUM: 8.8 mg/dL (ref 8.4–10.5)
CHLORIDE: 106 meq/L (ref 96–112)
CO2: 28 mEq/L (ref 19–32)
Creatinine, Ser: 1.03 mg/dL (ref 0.40–1.50)
GFR: 72.83 mL/min (ref >60.00)
Glucose, Bld: 95 mg/dL (ref 70–99)
POTASSIUM: 4.5 meq/L (ref 3.5–5.1)
Sodium: 140 mEq/L (ref 135–145)
Total Bilirubin: 0.5 mg/dL (ref 0.2–1.2)
Total Protein: 6.2 g/dL (ref 6.0–8.3)

## 2017-04-26 LAB — CBC
HCT: 36 % — ABNORMAL LOW (ref 39.0–52.0)
HEMOGLOBIN: 12.6 g/dL — AB (ref 13.0–17.0)
MCHC: 34.8 g/dL (ref 30.0–36.0)
MCV: 89 fl (ref 78.0–100.0)
Platelets: 242 10*3/uL (ref 150.0–400.0)
RBC: 4.05 Mil/uL — AB (ref 4.22–5.81)
RDW: 13.8 % (ref 11.5–15.5)
WBC: 5 10*3/uL (ref 4.0–10.5)

## 2017-04-26 LAB — TSH: TSH: 4.64 u[IU]/mL — AB (ref 0.35–4.50)

## 2017-04-26 LAB — LIPID PANEL
CHOLESTEROL: 99 mg/dL (ref 0–200)
HDL: 42 mg/dL (ref >39.00)
LDL Cholesterol: 43 mg/dL (ref 0–99)
NonHDL: 57.33
TRIGLYCERIDES: 70 mg/dL (ref 0.0–149.0)
Total CHOL/HDL Ratio: 2
VLDL: 14 mg/dL (ref 0.0–40.0)

## 2017-04-26 LAB — VITAMIN D 25 HYDROXY (VIT D DEFICIENCY, FRACTURES): VITD: 33.52 ng/mL (ref 30.00–100.00)

## 2017-04-26 LAB — HEMOGLOBIN A1C: HEMOGLOBIN A1C: 5.8 % (ref 4.6–6.5)

## 2017-04-26 MED FILL — CLOPIDOGREL 75 MG TABLET: 75 | 90 days supply | Qty: 90 | Fill #2

## 2017-04-27 LAB — T4, FREE: FREE T4: 1.03 ng/dL (ref 0.60–1.60)

## 2017-04-28 LAB — PAIN MGMT, PROFILE 8 W/CONF, U
6 Acetylmorphine: NEGATIVE ng/mL (ref <10)
ALCOHOL METABOLITES: NEGATIVE ng/mL (ref <500)
Amphetamines: NEGATIVE ng/mL (ref <500)
Benzodiazepines: NEGATIVE ng/mL (ref <100)
Buprenorphine, Urine: NEGATIVE ng/mL (ref <5)
Cocaine Metabolite: NEGATIVE ng/mL (ref <150)
Creatinine: 98.7 mg/dL
MARIJUANA METABOLITE: NEGATIVE ng/mL (ref <20)
MDMA: NEGATIVE ng/mL (ref <500)
OPIATES: NEGATIVE ng/mL (ref <100)
OXIDANT: NEGATIVE ug/mL (ref <200)
OXYCODONE: NEGATIVE ng/mL (ref <100)
pH: 7.03 (ref 4.5–9.0)

## 2017-04-28 LAB — PAIN MGMT, TRAMADOL W/MEDMATCH, U
DESMETHYLTRAMADOL: 5383 ng/mL — AB (ref <100)
TRAMADOL: 21018 ng/mL — AB (ref <100)

## 2017-04-28 MED FILL — MINOXIDIL 10 MG TABLET: 10 | 30 days supply | Qty: 30 | Fill #2

## 2017-04-28 NOTE — Telephone Encounter (Signed)
Medication sent in. 

## 2017-05-11 ENCOUNTER — Other Ambulatory Visit: Payer: Self-pay | Admitting: Family Medicine

## 2017-05-11 DIAGNOSIS — M549 Dorsalgia, unspecified: Secondary | ICD-10-CM

## 2017-05-11 MED FILL — METOCLOPRAMIDE 5 MG TABLET: 5 | 30 days supply | Qty: 30 | Fill #1

## 2017-05-12 NOTE — Telephone Encounter (Signed)
Patient will be out of this medication tomorrow, Thurs 05/13/17.

## 2017-05-13 NOTE — Telephone Encounter (Signed)
LOV 04/20/17 Dr. Charlett Blake Last refill 04/20/17 # 60 0 refills

## 2017-05-13 NOTE — Telephone Encounter (Signed)
Patient calling to inquire about this refill. Please advise.

## 2017-05-14 ENCOUNTER — Telehealth: Payer: Self-pay | Admitting: Family Medicine

## 2017-05-14 NOTE — Telephone Encounter (Signed)
Tramadol refill (requesting refill today) Last OV: 04/20/17 Last Refill:04/20/17 60 tab/0 refills Pharmacy: [place], [place] - [address] [phone] (Phone) [phone] (Fax)

## 2017-05-14 NOTE — Telephone Encounter (Unsigned)
Copied from [place] [id number]. Topic: Quick Communication - Rx Refill/Question >> May 14, 2017 11:45 AM Carolyn Stare wrote: Medication traMADol (ULTRAM) 50 MG tablet    pt need this RX today   Has the patient contacted their pharmacy yes    Preferred [facility]: Please be advised that RX refills may take up to 3 business days. We ask that you follow-up with your pharmacy.

## 2017-05-14 NOTE — Telephone Encounter (Signed)
Pt called back to check the status of Rx, and if anyone can fill it the pt would be appreciative

## 2017-05-14 NOTE — Telephone Encounter (Signed)
Requesting:tramadol Contract:yes UDS:low risk next screen 10/26/17 Last OV:04/20/17 Next OV:12/13/17 Last Refill:04/20/17 Database:   Please advise

## 2017-05-14 NOTE — Telephone Encounter (Signed)
Pt is calling about rx - he needs done before 5 pm [place] [facility]

## 2017-05-14 NOTE — Telephone Encounter (Signed)
Requesting:Tramadol Contract:04/27/17 UDS:04/26/17 low risk Last Visit:04/20/17 Next Visit:12/13/17 Last Refill:04/20/17 No discrepancies  Please Advise

## 2017-05-16 NOTE — Telephone Encounter (Signed)
He will need an appointment by October to continue refills. I did refill

## 2017-05-16 NOTE — Telephone Encounter (Signed)
completed

## 2017-05-17 MED FILL — CloNIDine HCL 0.1 MG TAB: 0.1 | 30 days supply | Qty: 120 | Fill #1

## 2017-05-17 MED FILL — traMADol HCL 50 MG TABS: 50 | 20 days supply | Qty: 60 | Fill #0

## 2017-05-17 NOTE — Telephone Encounter (Signed)
Patient says he was supposed to get a 90 day supply of Tramedol and was only sent 20 days supply yesterday. Please advise.

## 2017-05-17 NOTE — Telephone Encounter (Signed)
We do not generally do a 90 day supply of the controlled meds. My understanding is that he takes 2 a day so 60 should last 30 days

## 2017-05-18 NOTE — Telephone Encounter (Signed)
So 3 a day x 3 months is just too many pills. In the past he has between 60 and 90 tabs a month. I am willing to compromise and he can have 2 a day for 60 a month and do 3 months at 180 or he can have 3 a day but only a 30 day supply at a time with 90 but 270 is too many tabs at once and is being monitored.

## 2017-05-18 NOTE — Telephone Encounter (Signed)
Author spoke with patient regarding tramadol use and supply. Pt states his contract states he is to receive a supply of 90, which was confirmed in the documentation and pt. Confirmed that he does take tramadol tid for his chronic back pain. Author told patient she would ask Dr. Charlett Blake for approval to order additional 30day supply as pt. Only received 60day supply, and would call patient back regarding the status. Pt. appreciative. Author reminded pt. about the need to make an appointment in October for another UDS and pt. stated he would call at a later time to schedule. Conversation routed to Dr. Charlett Blake.

## 2017-05-19 NOTE — Telephone Encounter (Signed)
So he can have #90 but not a 3 month supply of that ie 270. Due to it is a controlled med

## 2017-05-19 NOTE — Telephone Encounter (Signed)
Author left message on pt's personal cell phone re: dispensing of tramadol Rx. Author stated we were not able to order a dispense amount of 90 pills at one time, so pt. would have to request another 60 day supply in July (two months from when it was refilled on 5/5) if needed. Author reminded pt. On VM to call if any other questions, and to schedule an appointment for October. Note routed to Dr. Charlett Blake.

## 2017-05-25 ENCOUNTER — Encounter: Payer: Self-pay | Admitting: Medical

## 2017-05-25 ENCOUNTER — Ambulatory Visit (HOSPITAL_BASED_OUTPATIENT_CLINIC_OR_DEPARTMENT_OTHER)
Admission: RE | Admit: 2017-05-25 | Discharge: 2017-05-25 | Disposition: A | Discharge status: Home / Self Care | Payer: PPO | Source: Ambulatory Visit | Attending: Medical | Admitting: Medical

## 2017-05-25 ENCOUNTER — Ambulatory Visit (INDEPENDENT_AMBULATORY_CARE_PROVIDER_SITE_OTHER): Payer: PPO | Admitting: Medical

## 2017-05-25 VITALS — BP 155/58 | HR 68 | Temp 97.8°F | Resp 14 | Ht 70.0 in | Wt 171.2 lb

## 2017-05-25 DIAGNOSIS — R0600 Dyspnea, unspecified: Secondary | ICD-10-CM

## 2017-05-25 DIAGNOSIS — R062 Wheezing: Secondary | ICD-10-CM | POA: Diagnosis not present

## 2017-05-25 DIAGNOSIS — R05 Cough: Secondary | ICD-10-CM

## 2017-05-25 DIAGNOSIS — R059 Cough, unspecified: Secondary | ICD-10-CM

## 2017-05-25 LAB — CBC WITH DIFFERENTIAL/PLATELET
Basophils Absolute: 0.1 10*3/uL (ref 0.0–0.1)
Basophils Relative: 1.1 % (ref 0.0–3.0)
EOS ABS: 0.2 10*3/uL (ref 0.0–0.7)
Eosinophils Relative: 3.7 % (ref 0.0–5.0)
HEMATOCRIT: 36.8 % — AB (ref 39.0–52.0)
HEMOGLOBIN: 12.8 g/dL — AB (ref 13.0–17.0)
LYMPHS PCT: 15.4 % (ref 12.0–46.0)
Lymphs Abs: 1 10*3/uL (ref 0.7–4.0)
MCHC: 34.7 g/dL (ref 30.0–36.0)
MCV: 89.1 fl (ref 78.0–100.0)
MONOS PCT: 6.9 % (ref 3.0–12.0)
Monocytes Absolute: 0.4 10*3/uL (ref 0.1–1.0)
NEUTROS ABS: 4.7 10*3/uL (ref 1.4–7.7)
Neutrophils Relative %: 72.9 % (ref 43.0–77.0)
Platelets: 278 10*3/uL (ref 150.0–400.0)
RBC: 4.13 Mil/uL — AB (ref 4.22–5.81)
RDW: 14.2 % (ref 11.5–15.5)
WBC: 6.4 10*3/uL (ref 4.0–10.5)

## 2017-05-25 LAB — BRAIN NATRIURETIC PEPTIDE: Pro B Natriuretic peptide (BNP): 148 pg/mL — ABNORMAL HIGH (ref 0.0–100.0)

## 2017-05-25 MED ORDER — AZITHROMYCIN 250 MG PO TABS: ORAL_TABLET | ORAL | 0 refills | Status: DC

## 2017-05-25 MED ORDER — FLUTICASONE PROPIONATE HFA 110 MCG/ACT IN AERO: 2.0000 | INHALATION_SPRAY | Freq: Two times a day (BID) | RESPIRATORY_TRACT | 0 refills | Status: DC

## 2017-05-25 MED ORDER — METHYLPREDNISOLONE ACETATE 40 MG/ML IJ SUSP
40.0000 mg | Freq: Once | INTRAMUSCULAR | Status: AC
  Administered 2017-05-25: 40 mg via INTRAMUSCULAR

## 2017-05-25 MED ORDER — BENZONATATE 100 MG PO CAPS: 100.0000 mg | ORAL_CAPSULE | Freq: Three times a day (TID) | ORAL | 0 refills | Status: DC | PRN

## 2017-05-25 MED FILL — AZITHROMYCIN 250 MG TABS: 250 | 5 days supply | Qty: 6 | Fill #0

## 2017-05-25 MED FILL — BENZONATATE 100 MG CAP: 100 | 10 days supply | Qty: 30 | Fill #0

## 2017-05-25 NOTE — Patient Instructions (Addendum)
You do appear to have bronchitis with wheezing.  Some concern for potential pneumonia.  Want to assess your condition with chest x-ray, CBC and BMP.  You reported success in the past with a azithromycin antibiotic.  If chest x-ray shows a pneumonia we will need to add Augmentin antibiotic.    For cough, I prescribed benzonatate.  For scattered wheezing,we gave you Depo-Medrol 40 mg IM.    We gave you depomedrol injection but I did not give you oral prednisone as you state you can't tolerate oral form but can do injection with no hx of adverse reaction.  Continue your albuterol as needed.  If your wheezing worsens please let me know.  In that event I would inform Dr. Charlett Blake and see if she would be willing to write Medrol Dosepak.  Follow-up in 7 days or as needed.  If any dramatic worsening/changing signs symptoms after hours and recommend ED evaluation.

## 2017-05-25 NOTE — Progress Notes (Signed)
Subjective:    Patient ID: [name], male    DOB: 1929-07-15, 84 y.o.   MRN: [id number]  HPI  Pt states sick for about 4 days. He states at first started out nasal congested. Then after a day or two nasal congested stopped but then last 2 days feels chest congestion. States gurgling sound at times and wheezing. He is coughing some day and night. Some productive cough about one day ago. Last day not very productive. Last 2 nights states wheezing a lot.  Pt stopped smoking age 59 yo. Started smoking at 84 yo. 3-4 cigarettes a day before he stopped age 50 yo.  Pt has inhaler. Used it the other night and he states it helped a lot.   Pt states he can take depomedrol injection. No adverse reaction. But remote history of agitated with prior oral prednisone. In addition he swelled a lot/retained fluids.     Review of Systems  HENT: Negative for congestion, postnasal drip, sinus pressure and sinus pain.   Respiratory: Positive for cough and wheezing. Negative for chest tightness.        Chest congestion.  Cardiovascular: Negative for chest pain and palpitations.  Gastrointestinal: Negative for abdominal pain, diarrhea and nausea.  Musculoskeletal: Negative for back pain and joint swelling.  Skin: Negative for rash.  Neurological: Negative for dizziness, speech difficulty, weakness, numbness and headaches.  Hematological: Negative for adenopathy. Does not bruise/bleed easily.  Psychiatric/Behavioral: Negative for behavioral problems, confusion, dysphoric mood and hallucinations.    Past Medical History:  Diagnosis Date  . Acute bronchitis 06/10/2015  . Allergic rhinitis   . Anemia   . Asthma   . Carotid stenosis    a. s/p Left CEA 2002;  b. Carotid US 3/16:  patent R CEA, L < 40%  . Chronic pain syndrome    Left shoulder, back  . Diverticulosis   . Dyslipidemia   . Epigastric pain 04/05/2016  . Esophageal stricture    Distal, benign  . GERD (gastroesophageal reflux disease)     . Hemorrhoids   . HTN (hypertension)    Negative renal duplex 07-22-11  . Hx of echocardiogram    a. Echo 10/11: mod LVH, EF 55-60%  . Pneumonia 07-2013  . Prostate cancer ([place]) 2000   Seed XRT  . Spondylosis, cervical, with myelopathy 11/26/2015  . Stroke [facility]) 8/01   right thalamic - on chronic Plavix/ASA     Social History   Socioeconomic History  . Marital status: Married    Spouse name: Not on file  . Number of children: 1  . Years of education: 59  . Highest education level: Not on file  Occupational History  . Occupation: Retired  Scientific laboratory technician  . Financial resource strain: Not on file  . Food insecurity:    Worry: Not on file    Inability: Not on file  . Transportation needs:    Medical: Not on file    Non-medical: Not on file  Tobacco Use  . Smoking status: Former Smoker    Packs/day: 0.25    Years: 35.00    Pack years: 8.75    Types: Cigarettes    Last attempt to quit: 01/12/1978    Years since quitting: 39.3  . Smokeless tobacco: Current User    Types: Chew  Substance and Sexual Activity  . Alcohol use: No    Comment: Prior alcoholic, sober since 0539  . Drug use: No  . Sexual activity: Not Currently  Lifestyle  .  Physical activity:    Days per week: Not on file    Minutes per session: Not on file  . Stress: Not on file  Relationships  . Social connections:    Talks on phone: Not on file    Gets together: Not on file    Attends religious service: Not on file    Active member of club or organization: Not on file    Attends meetings of clubs or organizations: Not on file    Relationship status: Not on file  . Intimate partner violence:    Fear of current or ex partner: Not on file    Emotionally abused: Not on file    Physically abused: Not on file    Forced sexual activity: Not on file  Other Topics Concern  . Not on file  Social History Narrative   Lives at home w/ his wife   Right-handed    Caffeine: drinks a lot of coffee per report     Past Surgical History:  Procedure Laterality Date  . APPENDECTOMY  1953  . CAROTID ENDARTERECTOMY Right 01/2000  . CATARACT EXTRACTION Bilateral   . CERVICAL DISC SURGERY  8/07  . INSERTION PROSTATE RADIATION SEED  2000  . [place] SURGERY  1990s  . NASAL SINUS SURGERY     x 3    Family History  Problem Relation Age of Onset  . Stroke Brother   . Stroke Sister   . Stroke Mother   . Hyperlipidemia Mother   . Hypertension Mother   . Lung disease Father   . Diabetes Son   . Gout Son   . Obesity Sister   . Alcohol abuse Brother   . Cancer Brother        lung, smoker    Allergies  Allergen Reactions  . Diovan [Valsartan] Other (See Comments)    Elevated potassium hyperkalemia  . Hctz [Hydrochlorothiazide] Other (See Comments)    Swelling and dyspnea   . Hydralazine     Chest pain, GI issues, shortness of breath  . Metformin And Related Other (See Comments)    Unknown reaction  . Codeine Nausea Only  . Oxycodone-Acetaminophen Nausea Only    Current Outpatient Medications on File Prior to Visit  Medication Sig Dispense Refill  . albuterol (PROVENTIL HFA;VENTOLIN HFA) 108 (90 Base) MCG/ACT inhaler Inhale 1-2 puffs into the lungs every 6 (six) hours as needed for wheezing or shortness of breath. 1 Inhaler 6  . amLODipine (NORVASC) 5 MG tablet TAKE 1 TABLET BY MOUTH TWICE A DAY 180 tablet 3  . aspirin EC 325 MG tablet Take 325 mg by mouth daily.    [name] Kitchen atorvastatin (LIPITOR) 40 MG tablet Take 1 tablet (40 mg total) by mouth daily. 90 tablet 1  . cholecalciferol (VITAMIN D) 1000 UNITS tablet Take 1,000 Units by mouth daily.    . cloNIDine (CATAPRES) 0.1 MG tablet Take 1 tablet by mouth 3 to 4 times by mouth each day. 120 tablet 11  . clopidogrel (PLAVIX) 75 MG tablet TAKE 1 TABLET BY MOUTH ONCE DAILY (Patient taking differently: TAKE 1 TABLET (75 MG) BY MOUTH ONCE DAILY) 90 tablet 3  . docusate sodium (COLACE) 100 MG capsule Take 100 mg by mouth 2 (two) times daily as  needed (constipation).     [name] Kitchen FLUoxetine (PROZAC) 10 MG tablet Take 1 tablet (10 mg total) by mouth daily. 30 tablet 3  . fluticasone (FLONASE) 50 MCG/ACT nasal spray PLACE 2 SPRAYS INTO BOTH NOSTRILS  DAILY. (Patient taking differently: PLACE 2 SPRAYS INTO BOTH NOSTRILS DAILY AS NEEDED FOR CONGESTION) 16 g 6  . gabapentin (NEURONTIN) 300 MG capsule Take 1 capsule (300 mg total) by mouth 3 (three) times daily. 270 capsule 1  . metoCLOPramide (REGLAN) 5 MG tablet TAKE 1 TABLET (5 MG) BY MOUTH AT BEDTIME 30 tablet 11  . minoxidil (LONITEN) 10 MG tablet Take 10 mg by mouth daily. 30 tablet 11  . pantoprazole (PROTONIX) 40 MG tablet Take 1 tablet (40 mg total) by mouth daily before breakfast. 90 tablet 3  . polyethylene glycol (MIRALAX / GLYCOLAX) packet Take 17 g by mouth daily as needed for mild constipation. Mix in 8 oz liquid and drink    . polyvinyl alcohol (ARTIFICIAL TEARS) 1.4 % ophthalmic solution Place 1 drop into both eyes daily as needed for dry eyes.    [name] Kitchen spironolactone (ALDACTONE) 50 MG tablet Take 0.5 tablets (25 mg total) by mouth daily. 30 tablet 11  . traMADol (ULTRAM) 50 MG tablet TAKE 1 TABLET BY MOUTH EVERY 8 HOURS AS NEEDED. DO NOT FILL UNTIL 04/23/17 60 tablet 0  . triamcinolone cream (KENALOG) 0.5 % Apply 1 application topically daily as needed (for skin irritation).     . Vitamin D, Ergocalciferol, (DRISDOL) 50000 units CAPS capsule Take 1 capsule (50,000 Units total) by mouth every 7 (seven) days. (Patient taking differently: Take 50,000 Units by mouth every Friday. ) 4 capsule 8   No current facility-administered medications on file prior to visit.     BP (!) 155/58   Pulse 68   Temp 97.8 F (36.6 C) (Oral)   Resp 14   Ht 5\' 10"  (1.778 m)   Wt 171 lb 3.2 oz (77.7 kg)   SpO2 98%   BMI 24.56 kg/m       Objective:   Physical Exam   General  Mental Status - Alert. General Appearance - Well groomed. Not in acute distress.  Skin Rashes- No Rashes.  HEENT Head-  Normal. Ear Auditory Canal - Left- Normal. Right - Normal.Tympanic Membrane- Left- Normal. Right- Normal. Eye Sclera/Conjunctiva- Left- Normal. Right- Normal. Nose & Sinuses Nasal Mucosa- Left- Not Boggy and Congested. Right- Not  Boggy and  Congested.Bilateral  No maxillary and  No rontal sinus pressure. Mouth & Throat Lips: Upper Lip- Normal: no dryness, cracking, pallor, cyanosis, or vesicular eruption. Lower Lip-Normal: no dryness, cracking, pallor, cyanosis or vesicular eruption. Buccal Mucosa- Bilateral- No Aphthous ulcers. Oropharynx- No Discharge or Erythema. Tonsils: Characteristics- Bilateral- No Erythema or Congestion. Size/Enlargement- Bilateral- No enlargement. Discharge- bilateral-None.  Neck Neck- Supple. No Masses.no jvd.    Chest and Lung Exam Auscultation: Breath Sounds:-even and unlabored.but scattered wheezing all over lung fields. No increased shortness of breath lying supine. Cardiovascular Auscultation:Rythm- Regular, rate and rhythm. Murmurs & Other Heart Sounds:Ausculatation of the heart reveal- No Murmurs.  Lymphatic Head & Neck General Head & Neck Lymphatics: Bilateral: Description- No Localized lymphadenopathy.  Lower extremity- faint 1+ pedal edema at best.  Calves are symmetric.  Negative Homans sign.      Assessment & Plan:  You do appear to have bronchitis with wheezing.  Some concern for potential pneumonia.  Want to assess your condition with chest x-ray, CBC and BMP.  You reported success in the past with a azithromycin antibiotic.  If chest x-ray shows a pneumonia we will need to add Augmentin antibiotic.    For cough, I prescribed benzonatate.  For scattered wheezing,we gave you Depo-Medrol 40 mg IM.  We gave you depomedrol injection but I did not give you oral prednisone as you state you can't tolerate oral form but can do injection with no hx of adverse reaction.  Continue your albuterol as needed.  If your wheezing worsens please  let me know.  In that event I would inform Dr. Charlett Blake and see if she would be willing to write Medrol Dosepak.  Follow-up in 7 days or as needed.  If any dramatic worsening/changing signs symptoms after hours and recommend ED evaluation.  Mackie Pai, PA-C

## 2017-05-27 MED FILL — MINOXIDIL 10 MG TABLET: 10 | 30 days supply | Qty: 30 | Fill #3

## 2017-05-31 ENCOUNTER — Encounter: Payer: Self-pay | Admitting: Family

## 2017-05-31 ENCOUNTER — Ambulatory Visit: Payer: Self-pay | Admitting: *Deleted

## 2017-05-31 ENCOUNTER — Ambulatory Visit (INDEPENDENT_AMBULATORY_CARE_PROVIDER_SITE_OTHER): Payer: PPO | Admitting: Family

## 2017-05-31 VITALS — BP 154/53 | HR 58 | Temp 98.5°F | Resp 16 | Ht 70.0 in | Wt 166.8 lb

## 2017-05-31 DIAGNOSIS — W5501XA Bitten by cat, initial encounter: Secondary | ICD-10-CM

## 2017-05-31 DIAGNOSIS — S51812A Laceration without foreign body of left forearm, initial encounter: Secondary | ICD-10-CM

## 2017-05-31 DIAGNOSIS — J209 Acute bronchitis, unspecified: Secondary | ICD-10-CM

## 2017-05-31 MED ORDER — AMOXICILLIN-POT CLAVULANATE 875-125 MG PO TABS
1.0000 | ORAL_TABLET | Freq: Two times a day (BID) | ORAL | 0 refills | Status: DC
Start: 2017-05-31 — End: 2017-08-17

## 2017-05-31 MED FILL — AMOX-CLAV 875-125 MG TABLET: 875-125 | 10 days supply | Qty: 20 | Fill #0

## 2017-05-31 NOTE — Patient Instructions (Signed)
Add mucinex 600mg  twice daily for chest congestion. Restart flovent. Start augmentin twice daily for cellulitis. Call if increased redness/pain/swelling or left hand or if not improved in 2-3 days. Call if cough/congestion/wheezing worsens or if it is not improved in 2-3 days.

## 2017-05-31 NOTE — Progress Notes (Signed)
Subjective:    Patient ID: [name], male    DOB: [birth year], 84 y.o.   MRN: [id number]  HPI  Patient is an 84 yr old male who presents today with chief complaint of left hand pain/swelling. Friday (notes that    Review of Systems See HPI  Past Medical History:  Diagnosis Date  . Acute bronchitis 06/10/2015  . Allergic rhinitis   . Anemia   . Asthma   . Carotid stenosis    a. s/p Left CEA 2002;  b. Carotid US 3/16:  patent R CEA, L < 40%  . Chronic pain syndrome    Left shoulder, back  . Diverticulosis   . Dyslipidemia   . Epigastric pain 04/05/2016  . Esophageal stricture    Distal, benign  . GERD (gastroesophageal reflux disease)   . Hemorrhoids   . HTN (hypertension)    Negative renal duplex 07-22-11  . Hx of echocardiogram    a. Echo 10/11: mod LVH, EF 55-60%  . Pneumonia 07-2013  . Prostate cancer ([place]) 2000   Seed XRT  . Spondylosis, cervical, with myelopathy 11/26/2015  . Stroke [facility]) 8/01   right thalamic - on chronic Plavix/ASA     Social History   Socioeconomic History  . Marital status: Married    Spouse name: Not on file  . Number of children: 1  . Years of education: 29  . Highest education level: Not on file  Occupational History  . Occupation: Retired  Scientific laboratory technician  . Financial resource strain: Not on file  . Food insecurity:    Worry: Not on file    Inability: Not on file  . Transportation needs:    Medical: Not on file    Non-medical: Not on file  Tobacco Use  . Smoking status: Former Smoker    Packs/day: 0.25    Years: 35.00    Pack years: 8.75    Types: Cigarettes    Last attempt to quit: 01/12/1978    Years since quitting: 39.4  . Smokeless tobacco: Current User    Types: Chew  Substance and Sexual Activity  . Alcohol use: No    Comment: Prior alcoholic, sober since 4765  . Drug use: No  . Sexual activity: Not Currently  Lifestyle  . Physical activity:    Days per week: Not on file    Minutes per session: Not on file    . Stress: Not on file  Relationships  . Social connections:    Talks on phone: Not on file    Gets together: Not on file    Attends religious service: Not on file    Active member of club or organization: Not on file    Attends meetings of clubs or organizations: Not on file    Relationship status: Not on file  . Intimate partner violence:    Fear of current or ex partner: Not on file    Emotionally abused: Not on file    Physically abused: Not on file    Forced sexual activity: Not on file  Other Topics Concern  . Not on file  Social History Narrative   Lives at home w/ his wife   Right-handed    Caffeine: drinks a lot of coffee per report    Past Surgical History:  Procedure Laterality Date  . APPENDECTOMY  1953  . CAROTID ENDARTERECTOMY Right 01/2000  . CATARACT EXTRACTION Bilateral   . CERVICAL DISC SURGERY  8/07  . INSERTION PROSTATE  RADIATION SEED  2000  . [place] SURGERY  1990s  . NASAL SINUS SURGERY     x 3    Family History  Problem Relation Age of Onset  . Stroke Brother   . Stroke Sister   . Stroke Mother   . Hyperlipidemia Mother   . Hypertension Mother   . Lung disease Father   . Diabetes Son   . Gout Son   . Obesity Sister   . Alcohol abuse Brother   . Cancer Brother        lung, smoker    Allergies  Allergen Reactions  . Diovan [Valsartan] Other (See Comments)    Elevated potassium hyperkalemia  . Hctz [Hydrochlorothiazide] Other (See Comments)    Swelling and dyspnea   . Hydralazine     Chest pain, GI issues, shortness of breath  . Metformin And Related Other (See Comments)    Unknown reaction  . Codeine Nausea Only  . Oxycodone-Acetaminophen Nausea Only    Current Outpatient Medications on File Prior to Visit  Medication Sig Dispense Refill  . albuterol (PROVENTIL HFA;VENTOLIN HFA) 108 (90 Base) MCG/ACT inhaler Inhale 1-2 puffs into the lungs every 6 (six) hours as needed for wheezing or shortness of breath. 1 Inhaler 6  .  amLODipine (NORVASC) 5 MG tablet TAKE 1 TABLET BY MOUTH TWICE A DAY 180 tablet 3  . aspirin EC 325 MG tablet Take 325 mg by mouth daily.    [name] Kitchen atorvastatin (LIPITOR) 40 MG tablet Take 1 tablet (40 mg total) by mouth daily. 90 tablet 1  . azithromycin (ZITHROMAX) 250 MG tablet Take 2 tablets by mouth on day 1, followed by 1 tablet by mouth daily for 4 days. 6 tablet 0  . benzonatate (TESSALON) 100 MG capsule Take 1 capsule (100 mg total) by mouth 3 (three) times daily as needed for cough. 30 capsule 0  . cholecalciferol (VITAMIN D) 1000 UNITS tablet Take 1,000 Units by mouth daily.    . cloNIDine (CATAPRES) 0.1 MG tablet Take 1 tablet by mouth 3 to 4 times by mouth each day. 120 tablet 11  . clopidogrel (PLAVIX) 75 MG tablet TAKE 1 TABLET BY MOUTH ONCE DAILY (Patient taking differently: TAKE 1 TABLET (75 MG) BY MOUTH ONCE DAILY) 90 tablet 3  . docusate sodium (COLACE) 100 MG capsule Take 100 mg by mouth 2 (two) times daily as needed (constipation).     [name] Kitchen FLUoxetine (PROZAC) 10 MG tablet Take 1 tablet (10 mg total) by mouth daily. 30 tablet 3  . fluticasone (FLONASE) 50 MCG/ACT nasal spray PLACE 2 SPRAYS INTO BOTH NOSTRILS DAILY. (Patient taking differently: PLACE 2 SPRAYS INTO BOTH NOSTRILS DAILY AS NEEDED FOR CONGESTION) 16 g 6  . fluticasone (FLOVENT HFA) 110 MCG/ACT inhaler Inhale 2 puffs into the lungs 2 (two) times daily. 1 Inhaler 0  . gabapentin (NEURONTIN) 300 MG capsule Take 1 capsule (300 mg total) by mouth 3 (three) times daily. 270 capsule 1  . metoCLOPramide (REGLAN) 5 MG tablet TAKE 1 TABLET (5 MG) BY MOUTH AT BEDTIME 30 tablet 11  . minoxidil (LONITEN) 10 MG tablet Take 10 mg by mouth daily. 30 tablet 11  . pantoprazole (PROTONIX) 40 MG tablet Take 1 tablet (40 mg total) by mouth daily before breakfast. 90 tablet 3  . polyethylene glycol (MIRALAX / GLYCOLAX) packet Take 17 g by mouth daily as needed for mild constipation. Mix in 8 oz liquid and drink    . polyvinyl alcohol (ARTIFICIAL  TEARS)  1.4 % ophthalmic solution Place 1 drop into both eyes daily as needed for dry eyes.    [name] Kitchen spironolactone (ALDACTONE) 50 MG tablet Take 0.5 tablets (25 mg total) by mouth daily. 30 tablet 11  . triamcinolone cream (KENALOG) 0.5 % Apply 1 application topically daily as needed (for skin irritation).     . Vitamin D, Ergocalciferol, (DRISDOL) 50000 units CAPS capsule Take 1 capsule (50,000 Units total) by mouth every 7 (seven) days. (Patient taking differently: Take 50,000 Units by mouth every Friday. ) 4 capsule 8   No current facility-administered medications on file prior to visit.     BP (!) 154/53 (BP Location: Right Arm, Patient Position: Sitting, Cuff Size: Small)   Pulse (!) 58   Temp 98.5 F (36.9 C) (Oral)   Resp 16   Ht 5\' 10"  (1.778 m)   Wt 166 lb 12.8 oz (75.7 kg)   SpO2 95%   BMI 23.93 kg/m       Objective:   Physical Exam  Constitutional: He is oriented to person, place, and time. He appears well-developed and well-nourished. No distress.  Pulmonary/Chest: Effort normal. No respiratory distress. He has wheezes. He has rales.  Neurological: He is alert and oriented to person, place, and time.  Skin: + skin tear left forearm Ext: + Swelling/erythema of the left forearm        Assessment & Plan:  Skin tear- dressing changed today.  Cat bite- appears to have secondary cellulitis of hand. Will rx with augmentin.    Brochitis/bronchospasm- advised pt that given level of wheezing I would recommend oral prednisone. Wife states that he had suicidal thoughts on prednisone.  He is not taking flovent. Advised him to restart flovent, add mucinex 600mg  bid for chest congestion. Had neg CXR 5/14 and will be covered with augmentin.

## 2017-05-31 NOTE — Telephone Encounter (Signed)
Pt also skin tear lower  Arm  From  a  Fall  On Saturday that has a tear skin on that affected. Wife is  A retired  Marine scientist who dreesed the skin tear . Cat bite  Occurred on Friday  and  Appears  May be getting infected . Appoint made  For today with Yvone Neu    Reason for Disposition . [1] Puncture wound (hole through the skin) from claws or teeth AND [2] cat  Answer Assessment - Initial Assessment Questions 1. ANIMAL: "What type of animal caused the bite?" "Is the injury from a bite or a claw?" If the animal is a dog or a cat, ask: "Was it a pet or a stray?" "Was it acting ill or behaving strangely?"     Cat  Not  Sure  If  Bite or scatch  Cat is family pet.Cat is not acting strange  2. LOCATION: "Where is the bite located?"       Top of left hand   3. SIZE: "How big is the bite?" "What does it look like?"        1  Inch  The hand is  Red  And  Swollen   4. ONSET: "When did the bite happen?" (Minutes or hours ago)         2  Days  Ago   5. CIRCUMSTANCES: "Tell me how this happened."       Pt was  Placing cat in a  [place]  6. TETANUS: "When was the last tetanus booster?"      2015   7. PREGNANCY: "Is there any chance you are pregnant?" "When was your last menstrual period?"      N/a  Protocols used: ANIMAL BITE-A-AH

## 2017-06-01 MED FILL — AMLODIPINE BESYLATE 5 MG TA: 5 | 90 days supply | Qty: 180 | Fill #1

## 2017-06-03 ENCOUNTER — Other Ambulatory Visit: Payer: Self-pay | Admitting: Family Medicine

## 2017-06-03 DIAGNOSIS — M549 Dorsalgia, unspecified: Secondary | ICD-10-CM

## 2017-06-03 MED FILL — METOCLOPRAMIDE 5 MG TABLET: 5 | 30 days supply | Qty: 30 | Fill #2

## 2017-06-04 MED FILL — traMADol HCL 50 MG TABS: 50 | 20 days supply | Qty: 60 | Fill #0

## 2017-06-04 NOTE — Telephone Encounter (Signed)
Requesting:tramadol Contract:yes UDS:low risk next screen 10/26/17 Last OV:05/31/17 Next OV:06/24/17 Last Refill: Database:   Please advise

## 2017-06-18 MED FILL — PANTOPRAZOLE SOD DR 40 MG T: 40 | 90 days supply | Qty: 90 | Fill #1

## 2017-06-22 ENCOUNTER — Encounter (HOSPITAL_COMMUNITY): Payer: PPO

## 2017-06-22 ENCOUNTER — Ambulatory Visit: Payer: Self-pay

## 2017-06-24 ENCOUNTER — Other Ambulatory Visit: Payer: Self-pay | Admitting: Family Medicine

## 2017-06-24 ENCOUNTER — Ambulatory Visit: Payer: PPO | Admitting: Family Medicine

## 2017-06-24 ENCOUNTER — Other Ambulatory Visit: Payer: Self-pay

## 2017-06-24 DIAGNOSIS — M549 Dorsalgia, unspecified: Secondary | ICD-10-CM

## 2017-06-24 NOTE — Progress Notes (Signed)
Refill request for traMADol (ULTRAM) 50 MG tablet.   Last OV: 04/20/17 Last RF: 06/04/17  Please advise.

## 2017-06-24 NOTE — Telephone Encounter (Signed)
Request sent to provider for approval.  

## 2017-06-24 NOTE — Progress Notes (Signed)
He can have a refill next week it is early this week

## 2017-06-25 MED FILL — CloNIDine HCL 0.1 MG TAB: 0.1 | 30 days supply | Qty: 120 | Fill #2

## 2017-06-25 MED FILL — MINOXIDIL 10 MG TABLET: 10 | 30 days supply | Qty: 30 | Fill #4

## 2017-06-25 NOTE — Telephone Encounter (Signed)
Requesting: tramadol Contract: 04/28/18 UDS: 10/26/17 Last OV: 04/20/17 Next OV: 12/13/17 Last Refill: 06/04/17 Database: no discrepancies  20 day supply.  Please advise

## 2017-06-28 MED FILL — traMADol HCL 50 MG TABS: 50 | 20 days supply | Qty: 60 | Fill #0

## 2017-06-28 MED FILL — METOCLOPRAMIDE 5 MG TABLET: 5 | 30 days supply | Qty: 30 | Fill #3

## 2017-06-28 NOTE — Progress Notes (Signed)
Medication sent into pharmacy  

## 2017-07-19 ENCOUNTER — Other Ambulatory Visit: Payer: Self-pay | Admitting: Family Medicine

## 2017-07-19 DIAGNOSIS — E782 Mixed hyperlipidemia: Secondary | ICD-10-CM

## 2017-07-19 DIAGNOSIS — I10 Essential (primary) hypertension: Secondary | ICD-10-CM

## 2017-07-19 DIAGNOSIS — I6350 Cerebral infarction due to unspecified occlusion or stenosis of unspecified cerebral artery: Secondary | ICD-10-CM

## 2017-07-19 DIAGNOSIS — M549 Dorsalgia, unspecified: Secondary | ICD-10-CM

## 2017-07-19 DIAGNOSIS — E559 Vitamin D deficiency, unspecified: Secondary | ICD-10-CM

## 2017-07-19 NOTE — Telephone Encounter (Signed)
Pt is out of the tramodol and asking if can be called into pharmacy today.  [facility] [place] [address]  [place] [address]  Phone: [phone] Fax: [phone]

## 2017-07-20 MED FILL — traMADol HCL 50 MG TABS: 50 | 20 days supply | Qty: 60 | Fill #0

## 2017-07-20 MED FILL — ATORVASTATIN 40 MG TABLET: 40 | 90 days supply | Qty: 90 | Fill #0

## 2017-07-20 NOTE — Telephone Encounter (Signed)
Requesting: tramadol Contract:yes UDS: 04/26/17 next screen 10/26/17 Last OV:05/31/17 Next OV:12/13/17 Last Refill:06/26/17 #[id number] Database:   Please advise

## 2017-07-26 MED FILL — CLOPIDOGREL 75 MG TABLET: 75 | 90 days supply | Qty: 90 | Fill #3

## 2017-08-03 ENCOUNTER — Encounter (HOSPITAL_COMMUNITY): Payer: PPO

## 2017-08-03 ENCOUNTER — Ambulatory Visit: Payer: Self-pay

## 2017-08-03 MED FILL — MINOXIDIL 10 MG TABLET: 10 | 30 days supply | Qty: 30 | Fill #5

## 2017-08-04 MED FILL — CloNIDine HCL 0.1 MG TAB: 0.1 | 30 days supply | Qty: 120 | Fill #3

## 2017-08-09 ENCOUNTER — Other Ambulatory Visit: Payer: Self-pay | Admitting: Family Medicine

## 2017-08-09 DIAGNOSIS — M549 Dorsalgia, unspecified: Secondary | ICD-10-CM

## 2017-08-09 NOTE — Telephone Encounter (Signed)
Requesting:tramadol Contract:no UDS:low risk next screen 10/26/17 Last OV:05/31/17 Next OV:12/13/17 Last Refill:07/20/17  #[id number] Database:   Please advise

## 2017-08-09 NOTE — Telephone Encounter (Signed)
Patient calling to notify Dr. Charlett Blake that he will be going out of town tomorrow afternoon.

## 2017-08-10 MED FILL — traMADol HCL 50 MG TABS: 50 | 20 days supply | Qty: 60 | Fill #0

## 2017-08-16 MED FILL — METOCLOPRAMIDE 5 MG TABLET: 5 | 30 days supply | Qty: 30 | Fill #4

## 2017-08-17 ENCOUNTER — Other Ambulatory Visit: Payer: Self-pay

## 2017-08-17 ENCOUNTER — Ambulatory Visit (HOSPITAL_COMMUNITY)
Admission: RE | Admit: 2017-08-17 | Discharge: 2017-08-17 | Disposition: A | Discharge status: Home / Self Care | Payer: PPO | Source: Ambulatory Visit | Attending: Vascular Surgery | Admitting: Vascular Surgery

## 2017-08-17 ENCOUNTER — Encounter: Payer: Self-pay | Admitting: Vascular Surgery

## 2017-08-17 ENCOUNTER — Ambulatory Visit (INDEPENDENT_AMBULATORY_CARE_PROVIDER_SITE_OTHER): Payer: PPO | Admitting: Vascular Surgery

## 2017-08-17 VITALS — BP 157/63 | HR 54 | Temp 97.4°F | Resp 16 | Ht 70.0 in | Wt 165.0 lb

## 2017-08-17 DIAGNOSIS — I739 Peripheral vascular disease, unspecified: Secondary | ICD-10-CM | POA: Diagnosis not present

## 2017-08-17 DIAGNOSIS — Z87891 Personal history of nicotine dependence: Secondary | ICD-10-CM | POA: Insufficient documentation

## 2017-08-17 DIAGNOSIS — I6523 Occlusion and stenosis of bilateral carotid arteries: Secondary | ICD-10-CM | POA: Insufficient documentation

## 2017-08-17 DIAGNOSIS — I6521 Occlusion and stenosis of right carotid artery: Secondary | ICD-10-CM

## 2017-08-17 NOTE — Progress Notes (Signed)
Vascular and Vein Specialist of [facility]  Patient name: [name] MRN: [id number] DOB: January 14, 1929 Sex: male  REASON FOR VISIT: Follow-up carotid disease  HPI: [name] is a 84 y.o. male here today for follow-up.  He is status post right carotid endarterectomy in 2012.  Denies any neurologic deficits.  He is here today with his wife.  My last visit with him one year ago there was some concern regarding GI issues and possible mesenteric ischemia which I did not think was present.  He has resolved these issues.  Past Medical History:  Diagnosis Date  . Acute bronchitis 06/10/2015  . Allergic rhinitis   . Anemia   . Asthma   . Carotid stenosis    a. s/p Left CEA 2002;  b. Carotid US 3/16:  patent R CEA, L < 40%  . Chronic pain syndrome    Left shoulder, back  . Diverticulosis   . Dyslipidemia   . Epigastric pain 04/05/2016  . Esophageal stricture    Distal, benign  . GERD (gastroesophageal reflux disease)   . Hemorrhoids   . HTN (hypertension)    Negative renal duplex 07-22-11  . Hx of echocardiogram    a. Echo 10/11: mod LVH, EF 55-60%  . Pneumonia 07-2013  . Prostate cancer ([place]) 2000   Seed XRT  . Spondylosis, cervical, with myelopathy 11/26/2015  . Stroke [facility]) 8/01   right thalamic - on chronic Plavix/ASA    Family History  Problem Relation Age of Onset  . Stroke Brother   . Stroke Sister   . Stroke Mother   . Hyperlipidemia Mother   . Hypertension Mother   . Lung disease Father   . Diabetes Son   . Gout Son   . Obesity Sister   . Alcohol abuse Brother   . Cancer Brother        lung, smoker    SOCIAL HISTORY: Social History   Tobacco Use  . Smoking status: Former Smoker    Packs/day: 0.25    Years: 35.00    Pack years: 8.75    Types: Cigarettes    Last attempt to quit: 01/12/1978    Years since quitting: 39.6  . Smokeless tobacco: Current User    Types: Chew  Substance Use Topics  . Alcohol use: No   Comment: Prior alcoholic, sober since 1950    Allergies  Allergen Reactions  . Diovan [Valsartan] Other (See Comments)    Elevated potassium hyperkalemia  . Hctz [Hydrochlorothiazide] Other (See Comments)    Swelling and dyspnea   . Hydralazine     Chest pain, GI issues, shortness of breath  . Metformin And Related Other (See Comments)    Unknown reaction  . Prednisone     Suicidal thoughts  . Codeine Nausea Only  . Oxycodone-Acetaminophen Nausea Only    Current Outpatient Medications  Medication Sig Dispense Refill  . albuterol (PROVENTIL HFA;VENTOLIN HFA) 108 (90 Base) MCG/ACT inhaler Inhale 1-2 puffs into the lungs every 6 (six) hours as needed for wheezing or shortness of breath. 1 Inhaler 6  . amLODipine (NORVASC) 5 MG tablet TAKE 1 TABLET BY MOUTH TWICE A DAY 180 tablet 3  . aspirin EC 325 MG tablet Take 325 mg by mouth daily.    [name] Kitchen atorvastatin (LIPITOR) 40 MG tablet Take 1 tablet (40 mg total) by mouth daily. 90 tablet 1  . atorvastatin (LIPITOR) 40 MG tablet TAKE 1 TABLET BY MOUTH ONCE DAILY 90 tablet 0  .  benzonatate (TESSALON) 100 MG capsule Take 1 capsule (100 mg total) by mouth 3 (three) times daily as needed for cough. 30 capsule 0  . cholecalciferol (VITAMIN D) 1000 UNITS tablet Take 1,000 Units by mouth daily.    . cloNIDine (CATAPRES) 0.1 MG tablet Take 1 tablet by mouth 3 to 4 times by mouth each day. 120 tablet 11  . clopidogrel (PLAVIX) 75 MG tablet TAKE 1 TABLET BY MOUTH ONCE DAILY (Patient taking differently: TAKE 1 TABLET (75 MG) BY MOUTH ONCE DAILY) 90 tablet 3  . docusate sodium (COLACE) 100 MG capsule Take 100 mg by mouth 2 (two) times daily as needed (constipation).     [name] Kitchen FLUoxetine (PROZAC) 10 MG tablet Take 1 tablet (10 mg total) by mouth daily. 30 tablet 3  . fluticasone (FLONASE) 50 MCG/ACT nasal spray PLACE 2 SPRAYS INTO BOTH NOSTRILS DAILY. (Patient taking differently: PLACE 2 SPRAYS INTO BOTH NOSTRILS DAILY AS NEEDED FOR CONGESTION) 16 g 6  .  fluticasone (FLOVENT HFA) 110 MCG/ACT inhaler Inhale 2 puffs into the lungs 2 (two) times daily. 1 Inhaler 0  . gabapentin (NEURONTIN) 300 MG capsule Take 1 capsule (300 mg total) by mouth 3 (three) times daily. 270 capsule 1  . metoCLOPramide (REGLAN) 5 MG tablet TAKE 1 TABLET (5 MG) BY MOUTH AT BEDTIME 30 tablet 11  . minoxidil (LONITEN) 10 MG tablet Take 10 mg by mouth daily. 30 tablet 11  . pantoprazole (PROTONIX) 40 MG tablet Take 1 tablet (40 mg total) by mouth daily before breakfast. 90 tablet 3  . polyethylene glycol (MIRALAX / GLYCOLAX) packet Take 17 g by mouth daily as needed for mild constipation. Mix in 8 oz liquid and drink    . polyvinyl alcohol (ARTIFICIAL TEARS) 1.4 % ophthalmic solution Place 1 drop into both eyes daily as needed for dry eyes.    [name] Kitchen spironolactone (ALDACTONE) 50 MG tablet Take 0.5 tablets (25 mg total) by mouth daily. 30 tablet 11  . traMADol (ULTRAM) 50 MG tablet TAKE 1 TABLET BY MOUTH EVERY 8 HOURS AS NEEDED 60 tablet 0  . triamcinolone cream (KENALOG) 0.5 % Apply 1 application topically daily as needed (for skin irritation).     . Vitamin D, Ergocalciferol, (DRISDOL) 50000 units CAPS capsule Take 1 capsule (50,000 Units total) by mouth every 7 (seven) days. (Patient taking differently: Take 50,000 Units by mouth every Friday. ) 4 capsule 8   No current facility-administered medications for this visit.     REVIEW OF SYSTEMS:  [X]  denotes positive finding, [ ]  denotes negative finding Cardiac  Comments:  Chest pain or chest pressure:    Shortness of breath upon exertion:    Short of breath when lying flat:    Irregular heart rhythm:        Vascular    Pain in calf, thigh, or hip brought on by ambulation:    Pain in feet at night that wakes you up from your sleep:     Blood clot in your veins:    Leg swelling:           PHYSICAL EXAM: Vitals:   08/17/17 1610  BP: (!) 157/63  Pulse: (!) 54  Resp: 16  Temp: (!) 97.4 F (36.3 C)  TempSrc: Oral    SpO2: 96%  Weight: 165 lb (74.8 kg)  Height: 5\' 10"  (1.778 m)    GENERAL: The patient is a well-nourished male, in no acute distress. The vital signs are documented above. CARDIOVASCULAR: Carotid arteries without  bruits bilaterally.  2+ radial pulses bilaterally. PULMONARY: There is good air exchange  MUSCULOSKELETAL: There are no major deformities or cyanosis. NEUROLOGIC: No focal weakness or paresthesias are detected. SKIN: There are no ulcers or rashes noted. PSYCHIATRIC: The patient has a normal affect.  DATA:  Widely patent endarterectomy on the right internal carotid artery mild stenosis on the left internal carotid less than 39%  MEDICAL ISSUES: Stable carotid follow-up.  No evidence of mesenteric ischemia symptoms.  Will be seen in 1 year with repeat carotid duplex follow-up    Rosetta Posner, MD [facility] Vascular and Vein Specialists of [facility] Tel [phone] Pager [phone]

## 2017-08-23 MED FILL — GABAPENTIN 300 MG CAPSULE: 300 | 90 days supply | Qty: 270 | Fill #0

## 2017-08-30 ENCOUNTER — Other Ambulatory Visit: Payer: Self-pay | Admitting: Family Medicine

## 2017-08-30 DIAGNOSIS — M549 Dorsalgia, unspecified: Secondary | ICD-10-CM

## 2017-08-30 MED FILL — MINOXIDIL 10 MG TABLET: 10 | 30 days supply | Qty: 30 | Fill #6

## 2017-08-31 MED FILL — traMADol HCL 50 MG TABS: 50 | 20 days supply | Qty: 60 | Fill #0

## 2017-08-31 NOTE — Telephone Encounter (Signed)
Received refill request for tramadol.    Last OV: 05/25/17 Last RF: 08/09/17

## 2017-09-01 DIAGNOSIS — H61021 Chronic perichondritis of right external ear: Secondary | ICD-10-CM | POA: Diagnosis not present

## 2017-09-01 DIAGNOSIS — L0109 Other impetigo: Secondary | ICD-10-CM | POA: Diagnosis not present

## 2017-09-01 DIAGNOSIS — Z85828 Personal history of other malignant neoplasm of skin: Secondary | ICD-10-CM | POA: Diagnosis not present

## 2017-09-01 DIAGNOSIS — L0889 Other specified local infections of the skin and subcutaneous tissue: Secondary | ICD-10-CM | POA: Diagnosis not present

## 2017-09-01 MED FILL — CEPHALEXIN 500 MG CAPSULE: 500 | 7 days supply | Qty: 21 | Fill #0

## 2017-09-01 MED FILL — AMLODIPINE BESYLATE 5 MG TA: 5 | 90 days supply | Qty: 180 | Fill #2

## 2017-09-17 MED FILL — METOCLOPRAMIDE 5 MG TABLET: 5 | 30 days supply | Qty: 30 | Fill #5

## 2017-09-20 ENCOUNTER — Other Ambulatory Visit: Payer: Self-pay | Admitting: Family Medicine

## 2017-09-20 DIAGNOSIS — M549 Dorsalgia, unspecified: Secondary | ICD-10-CM

## 2017-09-20 MED FILL — CloNIDine HCL 0.1 MG TAB: 0.1 | 30 days supply | Qty: 120 | Fill #4

## 2017-09-22 ENCOUNTER — Telehealth: Payer: Self-pay | Admitting: *Deleted

## 2017-09-22 ENCOUNTER — Encounter: Payer: Self-pay | Admitting: Medical

## 2017-09-22 ENCOUNTER — Ambulatory Visit (INDEPENDENT_AMBULATORY_CARE_PROVIDER_SITE_OTHER): Payer: PPO | Admitting: Medical

## 2017-09-22 VITALS — BP 160/70 | HR 58 | Temp 97.8°F | Resp 16 | Ht 70.0 in | Wt 167.0 lb

## 2017-09-22 DIAGNOSIS — R300 Dysuria: Secondary | ICD-10-CM | POA: Diagnosis not present

## 2017-09-22 DIAGNOSIS — R350 Frequency of micturition: Secondary | ICD-10-CM

## 2017-09-22 DIAGNOSIS — I10 Essential (primary) hypertension: Secondary | ICD-10-CM

## 2017-09-22 DIAGNOSIS — G8929 Other chronic pain: Secondary | ICD-10-CM

## 2017-09-22 DIAGNOSIS — M545 Low back pain: Secondary | ICD-10-CM | POA: Diagnosis not present

## 2017-09-22 DIAGNOSIS — M549 Dorsalgia, unspecified: Secondary | ICD-10-CM

## 2017-09-22 LAB — POC URINALSYSI DIPSTICK (AUTOMATED)
BILIRUBIN UA: NEGATIVE
Blood, UA: NEGATIVE
Glucose, UA: NEGATIVE
KETONES UA: NEGATIVE
Leukocytes, UA: NEGATIVE
Nitrite, UA: POSITIVE
PH UA: 6 (ref 5.0–8.0)
Protein, UA: POSITIVE — AB
Urobilinogen, UA: NEGATIVE E.U./dL — AB

## 2017-09-22 MED ORDER — SULFAMETHOXAZOLE-TRIMETHOPRIM 800-160 MG PO TABS: 1.0000 | ORAL_TABLET | Freq: Two times a day (BID) | ORAL | 0 refills | Status: DC

## 2017-09-22 MED ORDER — TRAMADOL HCL 50 MG PO TABS: ORAL_TABLET | ORAL | 0 refills | Status: DC

## 2017-09-22 MED ORDER — TRAMADOL HCL 50 MG PO TABS: 50.0000 mg | ORAL_TABLET | Freq: Three times a day (TID) | ORAL | 0 refills | Status: DC | PRN

## 2017-09-22 MED FILL — traMADol HCL 50 MG TABS: 50 | 20 days supply | Qty: 60 | Fill #0

## 2017-09-22 MED FILL — SULFAMETHOXAZOLE-TMP DS TAB: 800-160 | 10 days supply | Qty: 20 | Fill #0

## 2017-09-22 NOTE — Telephone Encounter (Signed)
Copied from [place] [id number]. Topic: General - Other >> Sep 22, 2017  3:39 PM Harmon, Scott wrote: Reason for CRM: Pt states he only received 30 traMADol (ULTRAM) 50 MG tablets and he normally gets 60 tablets. Pt states he is going out of town next week and he will gone for 2 weeks and needs the other 30 tablets. Cb# [phone]

## 2017-09-22 NOTE — Patient Instructions (Addendum)
For your recent frequent urination and painful urination(conern for uti vs possible prostate etiology?), we are getting a urine culture, CBC and PSA.  I also went ahead and sent in bactrim DS antibiotic pending study results.  For your history of chronic back pain, I refilled your tramdol as it appears you are due for refill of tramadol. On contract per epic review.  For hx of htn, your bp is high today even after checking twice. So please confirm bp reading at home and if over 140/90 then recommending you taking clonidine 4 times a day as instruction states 3-4 times daily.   Follow up in 10-14 days or as needed

## 2017-09-22 NOTE — Progress Notes (Signed)
Subjective:    Patient ID: [name], male    DOB: 25-Nov-1929, 84 y.o.   MRN: [id number]  HPI  Pt in for some pain on urination. He states slight increase in frequency of urination for about 2 days. Pt states no fever or sweats. Then states some mild chill when he urinated. No testicle pain. No new back pain.  Hx of prostate cancer surgery years ago. 15 years ago. Pt had seed implant and radiation.  Does report chronic pain. He states he is due for tramadol rx. Last rx was on 08-31-2017. He only get 60 tabs at a time. The script is enough to last him 20 days.   Pt bp is high initially. No gross motor or sensory function deficits. Pt is on amlodipine 5 mg bid. Clonidine 0.1 mg 3-4 times a day. Also on spirinolactone.    Review of Systems  Constitutional: Negative for chills.  Respiratory: Negative for cough, chest tightness, shortness of breath and wheezing.   Cardiovascular: Negative for chest pain and palpitations.  Gastrointestinal: Negative for abdominal distention, abdominal pain, diarrhea and vomiting.  Genitourinary: Positive for dysuria and frequency. Negative for flank pain, testicular pain and urgency.  Musculoskeletal: Negative for back pain.  Skin: Negative for rash.  Neurological: Negative for dizziness, seizures, speech difficulty, weakness, numbness and headaches.  Hematological: Negative for adenopathy. Does not bruise/bleed easily.  Psychiatric/Behavioral: Negative for behavioral problems and confusion.    Past Medical History:  Diagnosis Date  . Acute bronchitis 06/10/2015  . Allergic rhinitis   . Anemia   . Asthma   . Carotid stenosis    a. s/p Left CEA 2002;  b. Carotid US 3/16:  patent R CEA, L < 40%  . Chronic pain syndrome    Left shoulder, back  . Diverticulosis   . Dyslipidemia   . Epigastric pain 04/05/2016  . Esophageal stricture    Distal, benign  . GERD (gastroesophageal reflux disease)   . Hemorrhoids   . HTN (hypertension)    Negative  renal duplex 07-22-11  . Hx of echocardiogram    a. Echo 10/11: mod LVH, EF 55-60%  . Pneumonia 07-2013  . Prostate cancer ([place]) 2000   Seed XRT  . Spondylosis, cervical, with myelopathy 11/26/2015  . Stroke [facility]) 8/01   right thalamic - on chronic Plavix/ASA     Social History   Socioeconomic History  . Marital status: Married    Spouse name: Not on file  . Number of children: 1  . Years of education: 54  . Highest education level: Not on file  Occupational History  . Occupation: Retired  Scientific laboratory technician  . Financial resource strain: Not on file  . Food insecurity:    Worry: Not on file    Inability: Not on file  . Transportation needs:    Medical: Not on file    Non-medical: Not on file  Tobacco Use  . Smoking status: Former Smoker    Packs/day: 0.25    Years: 35.00    Pack years: 8.75    Types: Cigarettes    Last attempt to quit: 01/12/1978    Years since quitting: 39.7  . Smokeless tobacco: Current User    Types: Chew  Substance and Sexual Activity  . Alcohol use: No    Comment: Prior alcoholic, sober since 1751  . Drug use: No  . Sexual activity: Not Currently  Lifestyle  . Physical activity:    Days per week: Not on file  Minutes per session: Not on file  . Stress: Not on file  Relationships  . Social connections:    Talks on phone: Not on file    Gets together: Not on file    Attends religious service: Not on file    Active member of club or organization: Not on file    Attends meetings of clubs or organizations: Not on file    Relationship status: Not on file  . Intimate partner violence:    Fear of current or ex partner: Not on file    Emotionally abused: Not on file    Physically abused: Not on file    Forced sexual activity: Not on file  Other Topics Concern  . Not on file  Social History Narrative   Lives at home w/ his wife   Right-handed    Caffeine: drinks a lot of coffee per report    Past Surgical History:  Procedure Laterality Date    . APPENDECTOMY  1953  . CAROTID ENDARTERECTOMY Right 01/2000  . CATARACT EXTRACTION Bilateral   . CERVICAL DISC SURGERY  8/07  . INSERTION PROSTATE RADIATION SEED  2000  . [place] SURGERY  1990s  . NASAL SINUS SURGERY     x 3    Family History  Problem Relation Age of Onset  . Stroke Brother   . Stroke Sister   . Stroke Mother   . Hyperlipidemia Mother   . Hypertension Mother   . Lung disease Father   . Diabetes Son   . Gout Son   . Obesity Sister   . Alcohol abuse Brother   . Cancer Brother        lung, smoker    Allergies  Allergen Reactions  . Diovan [Valsartan] Other (See Comments)    Elevated potassium hyperkalemia  . Hctz [Hydrochlorothiazide] Other (See Comments)    Swelling and dyspnea   . Hydralazine     Chest pain, GI issues, shortness of breath  . Metformin And Related Other (See Comments)    Unknown reaction  . Prednisone     Suicidal thoughts  . Codeine Nausea Only  . Oxycodone-Acetaminophen Nausea Only    Current Outpatient Medications on File Prior to Visit  Medication Sig Dispense Refill  . albuterol (PROVENTIL HFA;VENTOLIN HFA) 108 (90 Base) MCG/ACT inhaler Inhale 1-2 puffs into the lungs every 6 (six) hours as needed for wheezing or shortness of breath. 1 Inhaler 6  . amLODipine (NORVASC) 5 MG tablet TAKE 1 TABLET BY MOUTH TWICE A DAY 180 tablet 3  . aspirin EC 325 MG tablet Take 325 mg by mouth daily.    [name] Kitchen atorvastatin (LIPITOR) 40 MG tablet Take 1 tablet (40 mg total) by mouth daily. 90 tablet 1  . atorvastatin (LIPITOR) 40 MG tablet TAKE 1 TABLET BY MOUTH ONCE DAILY 90 tablet 0  . benzonatate (TESSALON) 100 MG capsule Take 1 capsule (100 mg total) by mouth 3 (three) times daily as needed for cough. 30 capsule 0  . cholecalciferol (VITAMIN D) 1000 UNITS tablet Take 1,000 Units by mouth daily.    . cloNIDine (CATAPRES) 0.1 MG tablet Take 1 tablet by mouth 3 to 4 times by mouth each day. 120 tablet 11  . clopidogrel (PLAVIX) 75 MG tablet  TAKE 1 TABLET BY MOUTH ONCE DAILY (Patient taking differently: TAKE 1 TABLET (75 MG) BY MOUTH ONCE DAILY) 90 tablet 3  . docusate sodium (COLACE) 100 MG capsule Take 100 mg by mouth 2 (two) times daily  as needed (constipation).     [name] Kitchen FLUoxetine (PROZAC) 10 MG tablet Take 1 tablet (10 mg total) by mouth daily. 30 tablet 3  . fluticasone (FLONASE) 50 MCG/ACT nasal spray PLACE 2 SPRAYS INTO BOTH NOSTRILS DAILY. (Patient taking differently: PLACE 2 SPRAYS INTO BOTH NOSTRILS DAILY AS NEEDED FOR CONGESTION) 16 g 6  . fluticasone (FLOVENT HFA) 110 MCG/ACT inhaler Inhale 2 puffs into the lungs 2 (two) times daily. 1 Inhaler 0  . gabapentin (NEURONTIN) 300 MG capsule Take 1 capsule (300 mg total) by mouth 3 (three) times daily. 270 capsule 1  . metoCLOPramide (REGLAN) 5 MG tablet TAKE 1 TABLET (5 MG) BY MOUTH AT BEDTIME 30 tablet 11  . minoxidil (LONITEN) 10 MG tablet Take 10 mg by mouth daily. 30 tablet 11  . pantoprazole (PROTONIX) 40 MG tablet Take 1 tablet (40 mg total) by mouth daily before breakfast. 90 tablet 3  . polyethylene glycol (MIRALAX / GLYCOLAX) packet Take 17 g by mouth daily as needed for mild constipation. Mix in 8 oz liquid and drink    . polyvinyl alcohol (ARTIFICIAL TEARS) 1.4 % ophthalmic solution Place 1 drop into both eyes daily as needed for dry eyes.    [name] Kitchen spironolactone (ALDACTONE) 50 MG tablet Take 0.5 tablets (25 mg total) by mouth daily. 30 tablet 11  . traMADol (ULTRAM) 50 MG tablet TAKE 1 TABLET BY MOUTH EVERY 8 HOURS AS NEEDED 60 tablet 0  . triamcinolone cream (KENALOG) 0.5 % Apply 1 application topically daily as needed (for skin irritation).     . Vitamin D, Ergocalciferol, (DRISDOL) 50000 units CAPS capsule Take 1 capsule (50,000 Units total) by mouth every 7 (seven) days. (Patient taking differently: Take 50,000 Units by mouth every Friday. ) 4 capsule 8   No current facility-administered medications on file prior to visit.     BP (!) 170/65   Pulse (!) 58   Temp  97.8 F (36.6 C) (Oral)   Resp 16   Ht 5\' 10"  (1.778 m)   Wt 167 lb (75.8 kg)   SpO2 99%   BMI 23.96 kg/m       Objective:   Physical Exam  General Appearance- Not in acute distress.  HEENT Eyes- Scleraeral/Conjuntiva-bilat- Not Yellow. Mouth & Throat- Normal.  Chest and Lung Exam Auscultation: Breath sounds:-Normal. Adventitious sounds:- No Adventitious sounds.  Cardiovascular Auscultation:Rythm - Regular. Heart Sounds -Normal heart sounds.  Abdomen Inspection:-Inspection Normal.  Palpation/Perucssion: Palpation and Percussion of the abdomen reveal- faint mid suprapubic tenderness Tender, No Rebound tenderness, No rigidity(Guarding) and No Palpable abdominal masses.  Liver:-Normal.  Spleen:- Normal.   Back- no cva tenderness.   Neurologic Cranial Nerve exam:- CN III-XII intact(No nystagmus), symmetric smile. Finger to Nose:- Normal/Intact Strength:- 5/5 equal and symmetric strength both upper and lower extremities.      Assessment & Plan:  For your recent frequent urination and painful urination, we are getting a urine culture, CBC and PSA.  I also went ahead and sent in bactrim DS pending study results.  For your history of chronic back pain, I refilled your tramdol as it appears you are due for refill of tramadol. On contract per epic review.  For hx of htn, your bp is high today even after checking twice. So please confirm bp reading at home and if over 140/90 then recommending you taking clonidine 4 times a day as instruction states 3-4 times daily.   Follow up in 10-14 days or as needed  [employer], [employer]

## 2017-09-22 NOTE — Telephone Encounter (Signed)
I did go ahead and send an additional 30 tablets of tramadol.  He appears to get 60 tablets on each prior prescription written by his PCP.  I accidentally only sent him in 30 tablets today.  So I sent another prescription to his pharmacy and inform them that I intended to give him 60 tablets today.  Hopefully they will fill  without any issue.  I called patient and notified him that I was going to send in another 30 tablets.

## 2017-09-22 NOTE — Telephone Encounter (Signed)
Requesting: tramadol Contract: 04/28/18 UDS: 11/26/17 Last OV: 04/20/17 Next OV: 12/13/17 Last Refill: 08/31/17 Database: no descrepencies   Please advise

## 2017-09-22 NOTE — Telephone Encounter (Signed)
[name] -- looks like you saw pt and gave him Rx today. Please advise regarding below request?

## 2017-09-23 LAB — CBC WITH DIFFERENTIAL/PLATELET
BASOS ABS: 0.1 10*3/uL (ref 0.0–0.1)
BASOS PCT: 0.8 % (ref 0.0–3.0)
EOS ABS: 0.2 10*3/uL (ref 0.0–0.7)
Eosinophils Relative: 2.6 % (ref 0.0–5.0)
HCT: 35.5 % — ABNORMAL LOW (ref 39.0–52.0)
HEMOGLOBIN: 12.2 g/dL — AB (ref 13.0–17.0)
LYMPHS PCT: 13.9 % (ref 12.0–46.0)
Lymphs Abs: 0.8 10*3/uL (ref 0.7–4.0)
MCHC: 34.4 g/dL (ref 30.0–36.0)
MCV: 87.6 fl (ref 78.0–100.0)
MONO ABS: 0.4 10*3/uL (ref 0.1–1.0)
MONOS PCT: 6.2 % (ref 3.0–12.0)
Neutro Abs: 4.6 10*3/uL (ref 1.4–7.7)
Neutrophils Relative %: 76.5 % (ref 43.0–77.0)
Platelets: 257 10*3/uL (ref 150.0–400.0)
RBC: 4.06 Mil/uL — ABNORMAL LOW (ref 4.22–5.81)
RDW: 14.6 % (ref 11.5–15.5)
WBC: 6 10*3/uL (ref 4.0–10.5)

## 2017-09-23 LAB — PSA

## 2017-09-23 LAB — URINE CULTURE
MICRO NUMBER:: 91088183
SPECIMEN QUALITY:: ADEQUATE

## 2017-09-27 MED FILL — MINOXIDIL 10 MG TABLET: 10 | 30 days supply | Qty: 30 | Fill #7

## 2017-10-11 MED FILL — METOCLOPRAMIDE 5 MG TABLET: 5 | 30 days supply | Qty: 30 | Fill #6

## 2017-10-11 MED FILL — traMADol HCL 50 MG TABS: 50 | 20 days supply | Qty: 60 | Fill #0

## 2017-10-18 MED FILL — PANTOPRAZOLE SOD DR 40 MG T: 40 | 90 days supply | Qty: 90 | Fill #2

## 2017-10-25 ENCOUNTER — Other Ambulatory Visit: Payer: Self-pay | Admitting: Internal Medicine

## 2017-10-25 ENCOUNTER — Other Ambulatory Visit: Payer: Self-pay | Admitting: Family Medicine

## 2017-10-25 DIAGNOSIS — E559 Vitamin D deficiency, unspecified: Secondary | ICD-10-CM

## 2017-10-25 DIAGNOSIS — E782 Mixed hyperlipidemia: Secondary | ICD-10-CM

## 2017-10-25 DIAGNOSIS — I10 Essential (primary) hypertension: Secondary | ICD-10-CM

## 2017-10-25 DIAGNOSIS — I6350 Cerebral infarction due to unspecified occlusion or stenosis of unspecified cerebral artery: Secondary | ICD-10-CM

## 2017-10-25 MED FILL — ATORVASTATIN 40 MG TABLET: 40 | 90 days supply | Qty: 90 | Fill #1

## 2017-10-25 MED FILL — CLOPIDOGREL 75 MG TABLET: 75 | 90 days supply | Qty: 90 | Fill #0

## 2017-10-31 MED FILL — CloNIDine HCL 0.1 MG TAB: 0.1 | 30 days supply | Qty: 120 | Fill #5

## 2017-10-31 MED FILL — MINOXIDIL 10 MG TABLET: 10 | 30 days supply | Qty: 30 | Fill #8

## 2017-11-01 ENCOUNTER — Other Ambulatory Visit: Payer: Self-pay | Admitting: Family Medicine

## 2017-11-01 ENCOUNTER — Other Ambulatory Visit: Payer: Self-pay | Admitting: Medical

## 2017-11-01 DIAGNOSIS — M549 Dorsalgia, unspecified: Secondary | ICD-10-CM

## 2017-11-01 NOTE — Telephone Encounter (Signed)
Requesting:tramadol Contract:yes UDS:low risk 10/26/17 Last OV:09/22/17  Next OV:12/13/17 Last Refill:09/22/17  #[id number] Database:   Please advise

## 2017-11-02 ENCOUNTER — Telehealth: Payer: Self-pay

## 2017-11-02 ENCOUNTER — Ambulatory Visit: Payer: PPO | Admitting: Medical

## 2017-11-02 MED FILL — traMADol HCL 50 MG TABS: 50 | 20 days supply | Qty: 60 | Fill #0

## 2017-11-02 NOTE — Telephone Encounter (Signed)
If asymptomatic regarding prior urinary complaint then does not need to be seen or repeat studies. His bp was elevated on last visit. So that can be checked but if appointment inconvenient today can schedule other day.

## 2017-11-02 NOTE — Telephone Encounter (Signed)
Pt. has 240PM appointment, but stated that he will have trouble making appointment due to home heating issue, but was wondering if he could just come to lab to drop off urine sample. Pt. recently seen 9/11 for dysuria. Author advised [facility] to make appt.  later today still, and will route to PCP regarding needing OV at this time, or just lab.

## 2017-11-03 ENCOUNTER — Encounter: Payer: Self-pay | Admitting: Medical

## 2017-11-03 ENCOUNTER — Ambulatory Visit (INDEPENDENT_AMBULATORY_CARE_PROVIDER_SITE_OTHER): Payer: PPO | Admitting: Medical

## 2017-11-03 VITALS — BP 168/67 | HR 67 | Temp 98.7°F | Resp 16 | Ht 70.0 in | Wt 156.8 lb

## 2017-11-03 DIAGNOSIS — N390 Urinary tract infection, site not specified: Secondary | ICD-10-CM

## 2017-11-03 DIAGNOSIS — R300 Dysuria: Secondary | ICD-10-CM

## 2017-11-03 DIAGNOSIS — R350 Frequency of micturition: Secondary | ICD-10-CM

## 2017-11-03 LAB — POC URINALSYSI DIPSTICK (AUTOMATED)
Bilirubin, UA: NEGATIVE
Blood, UA: NEGATIVE
GLUCOSE UA: NEGATIVE
Ketones, UA: NEGATIVE
LEUKOCYTES UA: NEGATIVE
NITRITE UA: NEGATIVE
PROTEIN UA: POSITIVE — AB
Spec Grav, UA: 1.015 (ref 1.010–1.025)
UROBILINOGEN UA: NEGATIVE U/dL — AB
pH, UA: 6.5 (ref 5.0–8.0)

## 2017-11-03 MED ORDER — CIPROFLOXACIN HCL 500 MG PO TABS: 500.0000 mg | ORAL_TABLET | Freq: Two times a day (BID) | ORAL | 0 refills | Status: DC

## 2017-11-03 MED FILL — CIPROFLOXACIN HCL 500 MG TA: 500 | 7 days supply | Qty: 14 | Fill #0

## 2017-11-03 NOTE — Telephone Encounter (Signed)
Pt. Has appointment 10/23 at [place] with Mackie Pai, PA.

## 2017-11-03 NOTE — Progress Notes (Signed)
Subjective:    Patient ID: [name], male    DOB: Mar 14, 1929, 84 y.o.   MRN: [id number]  HPI  Pt in states on Sunday got recurrent burning on urination. He estimates about every other day burning on urination. Pt has been off pyridium for 2 days but was using from Sunday and monday. On Sunday night he had chills. No sweating that night. No fever.  He is going more frequently now. At times pain on urination severe.  Early September he had similar symptoms and I rx'd bactrim. But he used the medicine in setpember for 2-3  days and he stopped. Then just recently on Sunday symptoms came back and he took remaining 4 tabs of bactrim. This time bactrim seems not to be helping. Pt thinks he has been on cipro with no side effects in past   Early September had mixed bacteria on urine culture. Pt psa was 0.0. Urine in sept had no blood,   Review of Systems  Constitutional: Negative for chills, fatigue and fever.  Respiratory: Negative for cough, chest tightness, shortness of breath and wheezing.   Cardiovascular: Negative for chest pain and palpitations.  Gastrointestinal: Negative for abdominal pain.  Genitourinary: Positive for dysuria and frequency. Negative for penile pain, penile swelling, testicular pain and urgency.  Musculoskeletal: Negative for arthralgias, back pain and neck pain.  Skin: Negative for rash.  Neurological: Negative for dizziness, syncope, speech difficulty, weakness, numbness and headaches.  Hematological: Negative for adenopathy. Does not bruise/bleed easily.  Psychiatric/Behavioral: Negative for behavioral problems, confusion, dysphoric mood and sleep disturbance. The patient is not nervous/anxious.     Past Medical History:  Diagnosis Date  . Acute bronchitis 06/10/2015  . Allergic rhinitis   . Anemia   . Asthma   . Carotid stenosis    a. s/p Left CEA 2002;  b. Carotid US 3/16:  patent R CEA, L < 40%  . Chronic pain syndrome    Left shoulder, back  .  Diverticulosis   . Dyslipidemia   . Epigastric pain 04/05/2016  . Esophageal stricture    Distal, benign  . GERD (gastroesophageal reflux disease)   . Hemorrhoids   . HTN (hypertension)    Negative renal duplex 07-22-11  . Hx of echocardiogram    a. Echo 10/11: mod LVH, EF 55-60%  . Pneumonia 07-2013  . Prostate cancer ([place]) 2000   Seed XRT  . Spondylosis, cervical, with myelopathy 11/26/2015  . Stroke [facility]) 8/01   right thalamic - on chronic Plavix/ASA     Social History   Socioeconomic History  . Marital status: Married    Spouse name: Not on file  . Number of children: 1  . Years of education: 15  . Highest education level: Not on file  Occupational History  . Occupation: Retired  Scientific laboratory technician  . Financial resource strain: Not on file  . Food insecurity:    Worry: Not on file    Inability: Not on file  . Transportation needs:    Medical: Not on file    Non-medical: Not on file  Tobacco Use  . Smoking status: Former Smoker    Packs/day: 0.25    Years: 35.00    Pack years: 8.75    Types: Cigarettes    Last attempt to quit: 01/12/1978    Years since quitting: 39.8  . Smokeless tobacco: Current User    Types: Chew  Substance and Sexual Activity  . Alcohol use: No    Comment:  Prior alcoholic, sober since 6283  . Drug use: No  . Sexual activity: Not Currently  Lifestyle  . Physical activity:    Days per week: Not on file    Minutes per session: Not on file  . Stress: Not on file  Relationships  . Social connections:    Talks on phone: Not on file    Gets together: Not on file    Attends religious service: Not on file    Active member of club or organization: Not on file    Attends meetings of clubs or organizations: Not on file    Relationship status: Not on file  . Intimate partner violence:    Fear of current or ex partner: Not on file    Emotionally abused: Not on file    Physically abused: Not on file    Forced sexual activity: Not on file  Other  Topics Concern  . Not on file  Social History Narrative   Lives at home w/ his wife   Right-handed    Caffeine: drinks a lot of coffee per report    Past Surgical History:  Procedure Laterality Date  . APPENDECTOMY  1953  . CAROTID ENDARTERECTOMY Right 01/2000  . CATARACT EXTRACTION Bilateral   . CERVICAL DISC SURGERY  8/07  . INSERTION PROSTATE RADIATION SEED  2000  . [place] SURGERY  1990s  . NASAL SINUS SURGERY     x 3    Family History  Problem Relation Age of Onset  . Stroke Brother   . Stroke Sister   . Stroke Mother   . Hyperlipidemia Mother   . Hypertension Mother   . Lung disease Father   . Diabetes Son   . Gout Son   . Obesity Sister   . Alcohol abuse Brother   . Cancer Brother        lung, smoker    Allergies  Allergen Reactions  . Diovan [Valsartan] Other (See Comments)    Elevated potassium hyperkalemia  . Hctz [Hydrochlorothiazide] Other (See Comments)    Swelling and dyspnea   . Hydralazine     Chest pain, GI issues, shortness of breath  . Metformin And Related Other (See Comments)    Unknown reaction  . Prednisone     Suicidal thoughts  . Codeine Nausea Only  . Oxycodone-Acetaminophen Nausea Only    Current Outpatient Medications on File Prior to Visit  Medication Sig Dispense Refill  . albuterol (PROVENTIL HFA;VENTOLIN HFA) 108 (90 Base) MCG/ACT inhaler Inhale 1-2 puffs into the lungs every 6 (six) hours as needed for wheezing or shortness of breath. 1 Inhaler 6  . amLODipine (NORVASC) 5 MG tablet TAKE 1 TABLET BY MOUTH TWICE A DAY 180 tablet 3  . aspirin EC 325 MG tablet Take 325 mg by mouth daily.    [name] Kitchen atorvastatin (LIPITOR) 40 MG tablet Take 1 tablet (40 mg total) by mouth daily. 90 tablet 1  . atorvastatin (LIPITOR) 40 MG tablet TAKE 1 TABLET BY MOUTH ONCE DAILY 90 tablet 0  . benzonatate (TESSALON) 100 MG capsule Take 1 capsule (100 mg total) by mouth 3 (three) times daily as needed for cough. 30 capsule 0  . cholecalciferol  (VITAMIN D) 1000 UNITS tablet Take 1,000 Units by mouth daily.    . cloNIDine (CATAPRES) 0.1 MG tablet Take 1 tablet by mouth 3 to 4 times by mouth each day. 120 tablet 11  . clopidogrel (PLAVIX) 75 MG tablet TAKE 1 TABLET BY MOUTH ONCE  DAILY 90 tablet 1  . docusate sodium (COLACE) 100 MG capsule Take 100 mg by mouth 2 (two) times daily as needed (constipation).     [name] Kitchen FLUoxetine (PROZAC) 10 MG tablet Take 1 tablet (10 mg total) by mouth daily. 30 tablet 3  . fluticasone (FLONASE) 50 MCG/ACT nasal spray PLACE 2 SPRAYS INTO BOTH NOSTRILS DAILY. (Patient taking differently: PLACE 2 SPRAYS INTO BOTH NOSTRILS DAILY AS NEEDED FOR CONGESTION) 16 g 6  . fluticasone (FLOVENT HFA) 110 MCG/ACT inhaler Inhale 2 puffs into the lungs 2 (two) times daily. 1 Inhaler 0  . gabapentin (NEURONTIN) 300 MG capsule Take 1 capsule (300 mg total) by mouth 3 (three) times daily. 270 capsule 1  . metoCLOPramide (REGLAN) 5 MG tablet TAKE 1 TABLET (5 MG) BY MOUTH AT BEDTIME 30 tablet 11  . minoxidil (LONITEN) 10 MG tablet Take 10 mg by mouth daily. 30 tablet 11  . pantoprazole (PROTONIX) 40 MG tablet Take 1 tablet (40 mg total) by mouth daily before breakfast. 90 tablet 3  . polyethylene glycol (MIRALAX / GLYCOLAX) packet Take 17 g by mouth daily as needed for mild constipation. Mix in 8 oz liquid and drink    . polyvinyl alcohol (ARTIFICIAL TEARS) 1.4 % ophthalmic solution Place 1 drop into both eyes daily as needed for dry eyes.    [name] Kitchen spironolactone (ALDACTONE) 50 MG tablet Take 0.5 tablets (25 mg total) by mouth daily. 30 tablet 11  . sulfamethoxazole-trimethoprim (BACTRIM DS,SEPTRA DS) 800-160 MG tablet Take 1 tablet by mouth 2 (two) times daily. 20 tablet 0  . traMADol (ULTRAM) 50 MG tablet 1 tab po q 8 hours as needed pain 30 tablet 0  . traMADol (ULTRAM) 50 MG tablet Take 1 tablet (50 mg total) by mouth every 8 (eight) hours as needed. 30 tablet 0  . traMADol (ULTRAM) 50 MG tablet TAKE 1 TABLET BY MOUTH EVERY 8 HOURS AS  NEEDED 60 tablet 0  . triamcinolone cream (KENALOG) 0.5 % Apply 1 application topically daily as needed (for skin irritation).     . Vitamin D, Ergocalciferol, (DRISDOL) 50000 units CAPS capsule Take 1 capsule (50,000 Units total) by mouth every 7 (seven) days. (Patient taking differently: Take 50,000 Units by mouth every Friday. ) 4 capsule 8   No current facility-administered medications on file prior to visit.     BP (!) 168/67   Pulse 67   Temp 98.7 F (37.1 C) (Oral)   Resp 16   Ht 5\' 10"  (1.778 m)   Wt 156 lb 12.8 oz (71.1 kg)   SpO2 98%   BMI 22.50 kg/m       Objective:   Physical Exam  General- No acute distress. Pleasant patient.  Lungs- Clear, even and unlabored. Heart- regular rate and rhythm. Neurologic- CNII- XII grossly intact. Abdomen- soft, non-tender. Back- no cva tenderness.     Assessment & Plan:  You do have recent recurrent frequent urination and described severe dysuria since Sunday.  You report in early September Bactrim seem to clear up your symptoms completely but you never completed a full course.  Now with recurrence of symptoms for  2 days Bactrim   not helping.  So we will send your urine out for culture and prescribed Cipro antibiotic.  Stay well-hydrated and can use Pyridium for urinary pain.  If your symptoms worsen or change please let us know before the weekend.  Lab is closed today so unable to order PSA.  Follow-up in 7 days or  as needed.  Asked that you get scheduled in morning or early afternoon so we can get labs before lab closes.  Mackie Pai, PA-C

## 2017-11-03 NOTE — Patient Instructions (Signed)
You do have recent recurrent frequent urination and described severe dysuria since Sunday.  You report in early September Bactrim seem to clear up your symptoms completely but you never completed a full course.  Now with recurrence of symptoms for  2 days Bactrim   not helping.  So we will send your urine out for culture and prescribed Cipro antibiotic.  Stay well-hydrated and can use Pyridium for urinary pain.  If your symptoms worsen or change please let us know before the weekend.  Lab is closed today so unable to order PSA.  Follow-up in 7 days or as needed.  Asked that you get scheduled in morning or early afternoon so we can get labs before lab closes.

## 2017-11-05 LAB — URINE CULTURE
MICRO NUMBER:: 91275112
SPECIMEN QUALITY:: ADEQUATE

## 2017-11-09 MED FILL — METOCLOPRAMIDE 5 MG TABLET: 5 | 30 days supply | Qty: 30 | Fill #7

## 2017-11-22 ENCOUNTER — Other Ambulatory Visit: Payer: Self-pay | Admitting: Family Medicine

## 2017-11-22 DIAGNOSIS — M549 Dorsalgia, unspecified: Secondary | ICD-10-CM

## 2017-11-23 ENCOUNTER — Other Ambulatory Visit: Payer: Self-pay | Admitting: Family Medicine

## 2017-11-23 DIAGNOSIS — M549 Dorsalgia, unspecified: Secondary | ICD-10-CM

## 2017-11-23 MED ORDER — TRAMADOL HCL 50 MG PO TABS: 50.0000 mg | ORAL_TABLET | Freq: Three times a day (TID) | ORAL | 0 refills | Status: DC | PRN

## 2017-11-23 NOTE — Telephone Encounter (Signed)
Requested medication (s) are due for refill today: yes  Requested medication (s) are on the active medication list: yes  Last refill:  11/01/17  Future visit scheduled: yes  Notes to clinic:  Not delegated    Requested Prescriptions  Pending Prescriptions Disp Refills   traMADol (ULTRAM) 50 MG tablet 60 tablet 0    Sig: Take 1 tablet (50 mg total) by mouth every 8 (eight) hours as needed.     Not Delegated - Analgesics:  Opioid Agonists Failed - 11/23/2017  2:19 PM      Failed - This refill cannot be delegated      Failed - Urine Drug Screen completed in last 360 days.      Passed - Valid encounter within last 6 months    Recent Outpatient Visits          2 weeks ago Urinary tract infection without hematuria, site unspecified   [facility] [place], [place]   2 months ago [place] [facility] [place], [place]   5 months ago Skin tear of left forearm without complication, initial encounter   [facility] [place], NP   6 months ago Cough   [facility] [place], PA-C   7 months ago High risk medication use   [facility] [place], MD      Future Appointments            In 1 month Charlett Blake, Bonnita Levan, MD [facility], [place]

## 2017-11-23 NOTE — Telephone Encounter (Signed)
Copied from [place] [id number]. Topic: Quick Communication - See Telephone Encounter >> Nov 23, 2017  2:08 PM Hewitt Shorts wrote: Pt is needing a refill on his tramadol  [facility]   Best number  [phone]

## 2017-11-24 MED FILL — traMADol HCL 50 MG TABS: 50 | 20 days supply | Qty: 60 | Fill #0

## 2017-11-29 MED FILL — MINOXIDIL 10 MG TABLET: 10 | 30 days supply | Qty: 30 | Fill #9

## 2017-12-06 MED FILL — METOCLOPRAMIDE 5 MG TABLET: 5 | 30 days supply | Qty: 30 | Fill #8

## 2017-12-06 MED FILL — AMLODIPINE BESYLATE 5 MG TA: 5 | 90 days supply | Qty: 180 | Fill #3

## 2017-12-13 ENCOUNTER — Other Ambulatory Visit: Payer: Self-pay | Admitting: Family Medicine

## 2017-12-13 ENCOUNTER — Encounter: Payer: PPO | Admitting: Family Medicine

## 2017-12-13 DIAGNOSIS — M549 Dorsalgia, unspecified: Secondary | ICD-10-CM

## 2017-12-13 MED FILL — CloNIDine HCL 0.1 MG TAB: 0.1 | 30 days supply | Qty: 120 | Fill #6

## 2017-12-15 NOTE — Telephone Encounter (Signed)
Pt called to f/u on RX request stating he is out. He states he takes 3/day. 2/morning and 1/night. Advised pt of 3 business day turn around for medications and Dr. Charlett Blake out of office on Wednesdays. Pt states someone else needs to fill it and the PA has done it before. Please advise.

## 2017-12-16 MED FILL — traMADol HCL 50 MG TABS: 50 | 20 days supply | Qty: 60 | Fill #0

## 2017-12-16 NOTE — Addendum Note (Signed)
Addended by: Magdalene Molly A on: 12/16/2017 08:21 AM   Modules accepted: Orders

## 2017-12-28 ENCOUNTER — Other Ambulatory Visit: Payer: Self-pay | Admitting: Family Medicine

## 2017-12-28 MED FILL — GABAPENTIN 300 MG CAPSULE: 300 | 90 days supply | Qty: 270 | Fill #0

## 2017-12-31 DIAGNOSIS — L308 Other specified dermatitis: Secondary | ICD-10-CM | POA: Diagnosis not present

## 2017-12-31 DIAGNOSIS — Z85828 Personal history of other malignant neoplasm of skin: Secondary | ICD-10-CM | POA: Diagnosis not present

## 2017-12-31 MED FILL — TRIAMCINOLONE 0.1% CREAM: 0.1 | 30 days supply | Qty: 454 | Fill #0

## 2018-01-03 ENCOUNTER — Other Ambulatory Visit: Payer: Self-pay | Admitting: Family Medicine

## 2018-01-03 DIAGNOSIS — M549 Dorsalgia, unspecified: Secondary | ICD-10-CM

## 2018-01-03 MED FILL — METOCLOPRAMIDE 5 MG TABLET: 5 | 30 days supply | Qty: 30 | Fill #9

## 2018-01-03 MED FILL — MINOXIDIL 10 MG TABLET: 10 | 30 days supply | Qty: 30 | Fill #10

## 2018-01-03 MED FILL — traMADol HCL 50 MG TABS: 50 | 20 days supply | Qty: 60 | Fill #0

## 2018-01-03 NOTE — Telephone Encounter (Signed)
Requesting:tramadol Contract:yes UDS:low risk next screen 10/26/17 Last OV:11/03/17 w/Edward Next OV:01/11/18 w/you Last Refill:12/15/17  #[id number] Database:   Please advise

## 2018-01-11 ENCOUNTER — Ambulatory Visit (HOSPITAL_BASED_OUTPATIENT_CLINIC_OR_DEPARTMENT_OTHER)
Admission: RE | Admit: 2018-01-11 | Discharge: 2018-01-11 | Disposition: A | Discharge status: Home / Self Care | Payer: PPO | Source: Ambulatory Visit | Attending: Family Medicine | Admitting: Family Medicine

## 2018-01-11 ENCOUNTER — Ambulatory Visit (INDEPENDENT_AMBULATORY_CARE_PROVIDER_SITE_OTHER): Payer: PPO | Admitting: Family Medicine

## 2018-01-11 DIAGNOSIS — Z0000 Encounter for general adult medical examination without abnormal findings: Secondary | ICD-10-CM

## 2018-01-11 DIAGNOSIS — M542 Cervicalgia: Secondary | ICD-10-CM

## 2018-01-11 DIAGNOSIS — L03115 Cellulitis of right lower limb: Secondary | ICD-10-CM | POA: Insufficient documentation

## 2018-01-11 DIAGNOSIS — I10 Essential (primary) hypertension: Secondary | ICD-10-CM | POA: Diagnosis not present

## 2018-01-11 DIAGNOSIS — M545 Low back pain, unspecified: Secondary | ICD-10-CM | POA: Insufficient documentation

## 2018-01-11 DIAGNOSIS — G8929 Other chronic pain: Secondary | ICD-10-CM | POA: Diagnosis not present

## 2018-01-11 DIAGNOSIS — R739 Hyperglycemia, unspecified: Secondary | ICD-10-CM

## 2018-01-11 DIAGNOSIS — M25571 Pain in right ankle and joints of right foot: Secondary | ICD-10-CM | POA: Insufficient documentation

## 2018-01-11 DIAGNOSIS — E782 Mixed hyperlipidemia: Secondary | ICD-10-CM | POA: Diagnosis not present

## 2018-01-11 DIAGNOSIS — D649 Anemia, unspecified: Secondary | ICD-10-CM

## 2018-01-11 DIAGNOSIS — K5900 Constipation, unspecified: Secondary | ICD-10-CM | POA: Insufficient documentation

## 2018-01-11 DIAGNOSIS — M25579 Pain in unspecified ankle and joints of unspecified foot: Secondary | ICD-10-CM

## 2018-01-11 DIAGNOSIS — E559 Vitamin D deficiency, unspecified: Secondary | ICD-10-CM

## 2018-01-11 DIAGNOSIS — Z79899 Other long term (current) drug therapy: Secondary | ICD-10-CM | POA: Diagnosis not present

## 2018-01-11 MED ORDER — CEPHALEXIN 500 MG PO CAPS: 500.0000 mg | ORAL_CAPSULE | Freq: Four times a day (QID) | ORAL | 0 refills | Status: DC

## 2018-01-11 NOTE — Patient Instructions (Addendum)
Encouraged increased hydration and fiber in diet. Daily probiotics. If bowels not moving can use MOM 2 tbls po in 4 oz of warm prune juice by mouth every 2-3 days. If no results then repeat in 4 hours with  Dulcolax suppository pr, may repeat again in 4 more hours as needed. Seek care if symptoms worsen. Consider daily Miralax and/or Dulcolax if symptoms persist.   Miralax 1/2 to 1 dose mixed with Benefiber once to twice daily.  Moist heat followed by Voltaren gel and/or Lidocaine gel. Then call if no improvement   Bring [place] copies of your advanced directives and make sure your HCPOA has a copy and knows where the original is  Shingrix is the new shingles shot, 2 shots over 2-6 months at the pharmacy      Preventive Care 65 Years and Older, Male Preventive care refers to lifestyle choices and visits with your health care provider that can promote health and wellness. What does preventive care include?   A yearly physical exam. This is also called an annual well check.  Dental exams once or twice a year.  Routine eye exams. Ask your health care provider how often you should have your eyes checked.  Personal lifestyle choices, including: ? Daily care of your teeth and gums. ? Regular physical activity. ? Eating a healthy diet. ? Avoiding tobacco and drug use. ? Limiting alcohol use. ? Practicing safe sex. ? Taking low doses of aspirin every day. ? Taking vitamin and mineral supplements as recommended by your health care provider. What happens during an annual well check? The services and screenings done by your health care provider during your annual well check will depend on your age, overall health, lifestyle risk factors, and family history of disease. Counseling Your health care provider may ask you questions about your:  Alcohol use.  Tobacco use.  Drug use.  Emotional well-being.  Home and relationship well-being.  Sexual activity.  Eating habits.  History of  falls.  Memory and ability to understand (cognition).  Work and work Statistician. Screening You may have the following tests or measurements:  Height, weight, and BMI.  Blood pressure.  Lipid and cholesterol levels. These may be checked every 5 years, or more frequently if you are over 37 years old.  Skin check.  Lung cancer screening. You may have this screening every year starting at age 40 if you have a 30-pack-year history of smoking and currently smoke or have quit within the past 15 years.  Colorectal cancer screening. All adults should have this screening starting at age 26 and continuing until age 31. You will have tests every 1-10 years, depending on your results and the type of screening test. People at increased risk should start screening at an earlier age. Screening tests may include: ? Guaiac-based fecal occult blood testing. ? Fecal immunochemical test (FIT). ? Stool DNA test. ? Virtual colonoscopy. ? Sigmoidoscopy. During this test, a flexible tube with a tiny camera (sigmoidoscope) is used to examine your rectum and lower colon. The sigmoidoscope is inserted through your anus into your rectum and lower colon. ? Colonoscopy. During this test, a long, thin, flexible tube with a tiny camera (colonoscope) is used to examine your entire colon and rectum.  Prostate cancer screening. Recommendations will vary depending on your family history and other risks.  Hepatitis C blood test.  Hepatitis B blood test.  Sexually transmitted disease (STD) testing.  Diabetes screening. This is done by checking your blood sugar (glucose)  after you have not eaten for a while (fasting). You may have this done every 1-3 years.  Abdominal aortic aneurysm (AAA) screening. You may need this if you are a current or former smoker.  Osteoporosis. You may be screened starting at age 75 if you are at high risk. Talk with your health care provider about your test results, treatment options, and  if necessary, the need for more tests. Vaccines Your health care provider may recommend certain vaccines, such as:  Influenza vaccine. This is recommended every year.  Tetanus, diphtheria, and acellular pertussis (Tdap, Td) vaccine. You may need a Td booster every 10 years.  Varicella vaccine. You may need this if you have not been vaccinated.  Zoster vaccine. You may need this after age 23.  Measles, mumps, and rubella (MMR) vaccine. You may need at least one dose of MMR if you were born in 1957 or later. You may also need a second dose.  Pneumococcal 13-valent conjugate (PCV13) vaccine. One dose is recommended after age 77.  Pneumococcal polysaccharide (PPSV23) vaccine. One dose is recommended after age 25.  Meningococcal vaccine. You may need this if you have certain conditions.  Hepatitis A vaccine. You may need this if you have certain conditions or if you travel or work in places where you may be exposed to hepatitis A.  Hepatitis B vaccine. You may need this if you have certain conditions or if you travel or work in places where you may be exposed to hepatitis B.  Haemophilus influenzae type b (Hib) vaccine. You may need this if you have certain risk factors. Talk to your health care provider about which screenings and vaccines you need and how often you need them. This information is not intended to replace advice given to you by your health care provider. Make sure you discuss any questions you have with your health care provider. Document Released: 01/25/2015 Document Revised: 02/18/2017 Document Reviewed: 10/30/2014 Elsevier Interactive Patient Education  2019 [employer].

## 2018-01-11 NOTE — Assessment & Plan Note (Signed)
Encouraged moist heat and gentle stretching as tolerated. May try NSAIDs and prescription meds as directed and report if symptoms worsen or seek immediate care. Tramadol works well without concerning side effects

## 2018-01-11 NOTE — Assessment & Plan Note (Signed)
Continue to monitor

## 2018-01-11 NOTE — Assessment & Plan Note (Signed)
Supplement and monitor 

## 2018-01-11 NOTE — Progress Notes (Signed)
Subjective:    Patient ID: [name], male    DOB: 10-17-1929, 84 y.o.   MRN: [id number]  No chief complaint on file.   HPI Patient is in today for annual preventative exam and follow up on chronic medical concerns including hyperlipidemia, hypertension, hyperglycemia nd cerebrovascular disease. No recent febrile illness or hospitalizations. No polyuria or polydipsia. He is accompanied by his wife and they note a sore over his lateral malleolus on the right and pain with swelling. No fevers or chills. He is managing his ADLs well despite his ankle pain and constant pain in his neck. No recent fall or trauma. Denies CP/palp/SOB/HA/congestion/fevers/GI or GU c/o. Taking meds as prescribed  Past Medical History:  Diagnosis Date  . Acute bronchitis 06/10/2015  . Allergic rhinitis   . Anemia   . Asthma   . Carotid stenosis    a. s/p Left CEA 2002;  b. Carotid US 3/16:  patent R CEA, L < 40%  . Chronic pain syndrome    Left shoulder, back  . Diverticulosis   . Dyslipidemia   . Epigastric pain 04/05/2016  . Esophageal stricture    Distal, benign  . GERD (gastroesophageal reflux disease)   . Hemorrhoids   . HTN (hypertension)    Negative renal duplex 07-22-11  . Hx of echocardiogram    a. Echo 10/11: mod LVH, EF 55-60%  . Pneumonia 07-2013  . Prostate cancer ([place]) 2000   Seed XRT  . Spondylosis, cervical, with myelopathy 11/26/2015  . Stroke [facility]) 8/01   right thalamic - on chronic Plavix/ASA    Past Surgical History:  Procedure Laterality Date  . APPENDECTOMY  1953  . CAROTID ENDARTERECTOMY Right 01/2000  . CATARACT EXTRACTION Bilateral   . CERVICAL DISC SURGERY  8/07  . INSERTION PROSTATE RADIATION SEED  2000  . [place] SURGERY  1990s  . NASAL SINUS SURGERY     x 3    Family History  Problem Relation Age of Onset  . Stroke Brother   . Stroke Sister   . Stroke Mother   . Hyperlipidemia Mother   . Hypertension Mother   . Lung disease Father   . Diabetes Son     . Gout Son   . Obesity Sister   . Alcohol abuse Brother   . Cancer Brother        lung, smoker    Social History   Socioeconomic History  . Marital status: Married    Spouse name: Not on file  . Number of children: 1  . Years of education: 27  . Highest education level: Not on file  Occupational History  . Occupation: Retired  Scientific laboratory technician  . Financial resource strain: Not on file  . Food insecurity:    Worry: Not on file    Inability: Not on file  . Transportation needs:    Medical: Not on file    Non-medical: Not on file  Tobacco Use  . Smoking status: Former Smoker    Packs/day: 0.25    Years: 35.00    Pack years: 8.75    Types: Cigarettes    Last attempt to quit: 01/12/1978    Years since quitting: 40.0  . Smokeless tobacco: Current User    Types: Chew  Substance and Sexual Activity  . Alcohol use: No    Comment: Prior alcoholic, sober since 1478  . Drug use: No  . Sexual activity: Not Currently  Lifestyle  . Physical activity:  Days per week: Not on file    Minutes per session: Not on file  . Stress: Not on file  Relationships  . Social connections:    Talks on phone: Not on file    Gets together: Not on file    Attends religious service: Not on file    Active member of club or organization: Not on file    Attends meetings of clubs or organizations: Not on file    Relationship status: Not on file  . Intimate partner violence:    Fear of current or ex partner: Not on file    Emotionally abused: Not on file    Physically abused: Not on file    Forced sexual activity: Not on file  Other Topics Concern  . Not on file  Social History Narrative   Lives at home w/ his wife   Right-handed    Caffeine: drinks a lot of coffee per report    Outpatient Medications Prior to Visit  Medication Sig Dispense Refill  . albuterol (PROVENTIL HFA;VENTOLIN HFA) 108 (90 Base) MCG/ACT inhaler Inhale 1-2 puffs into the lungs every 6 (six) hours as needed for wheezing  or shortness of breath. 1 Inhaler 6  . amLODipine (NORVASC) 5 MG tablet TAKE 1 TABLET BY MOUTH TWICE A DAY 180 tablet 3  . aspirin EC 325 MG tablet Take 325 mg by mouth daily.    [name] Kitchen atorvastatin (LIPITOR) 40 MG tablet TAKE 1 TABLET BY MOUTH ONCE DAILY 90 tablet 0  . benzonatate (TESSALON) 100 MG capsule Take 1 capsule (100 mg total) by mouth 3 (three) times daily as needed for cough. 30 capsule 0  . cholecalciferol (VITAMIN D) 1000 UNITS tablet Take 1,000 Units by mouth daily.    . ciprofloxacin (CIPRO) 500 MG tablet Take 1 tablet (500 mg total) by mouth 2 (two) times daily. 14 tablet 0  . cloNIDine (CATAPRES) 0.1 MG tablet Take 1 tablet by mouth 3 to 4 times by mouth each day. 120 tablet 11  . clopidogrel (PLAVIX) 75 MG tablet TAKE 1 TABLET BY MOUTH ONCE DAILY 90 tablet 1  . docusate sodium (COLACE) 100 MG capsule Take 100 mg by mouth 2 (two) times daily as needed (constipation).     [name] Kitchen FLUoxetine (PROZAC) 10 MG tablet Take 1 tablet (10 mg total) by mouth daily. 30 tablet 3  . fluticasone (FLONASE) 50 MCG/ACT nasal spray PLACE 2 SPRAYS INTO BOTH NOSTRILS DAILY. (Patient taking differently: PLACE 2 SPRAYS INTO BOTH NOSTRILS DAILY AS NEEDED FOR CONGESTION) 16 g 6  . fluticasone (FLOVENT HFA) 110 MCG/ACT inhaler Inhale 2 puffs into the lungs 2 (two) times daily. 1 Inhaler 0  . gabapentin (NEURONTIN) 300 MG capsule TAKE 1 CAPSULE BY MOUTH 3 TIMES DAILY 270 capsule 1  . metoCLOPramide (REGLAN) 5 MG tablet TAKE 1 TABLET (5 MG) BY MOUTH AT BEDTIME 30 tablet 11  . minoxidil (LONITEN) 10 MG tablet Take 10 mg by mouth daily. 30 tablet 11  . pantoprazole (PROTONIX) 40 MG tablet Take 1 tablet (40 mg total) by mouth daily before breakfast. 90 tablet 3  . polyethylene glycol (MIRALAX / GLYCOLAX) packet Take 17 g by mouth daily as needed for mild constipation. Mix in 8 oz liquid and drink    . polyvinyl alcohol (ARTIFICIAL TEARS) 1.4 % ophthalmic solution Place 1 drop into both eyes daily as needed for dry eyes.     [name] Kitchen spironolactone (ALDACTONE) 50 MG tablet Take 0.5 tablets (25 mg total) by mouth daily.  30 tablet 11  . sulfamethoxazole-trimethoprim (BACTRIM DS,SEPTRA DS) 800-160 MG tablet Take 1 tablet by mouth 2 (two) times daily. 20 tablet 0  . traMADol (ULTRAM) 50 MG tablet TAKE 1 TABLET BY MOUTH EVERY 8 HOURS AS NEEDED 60 tablet 0  . triamcinolone cream (KENALOG) 0.5 % Apply 1 application topically daily as needed (for skin irritation).     . Vitamin D, Ergocalciferol, (DRISDOL) 50000 units CAPS capsule Take 1 capsule (50,000 Units total) by mouth every 7 (seven) days. (Patient taking differently: Take 50,000 Units by mouth every Friday. ) 4 capsule 8  . atorvastatin (LIPITOR) 40 MG tablet Take 1 tablet (40 mg total) by mouth daily. 90 tablet 1   No facility-administered medications prior to visit.     Allergies  Allergen Reactions  . Diovan [Valsartan] Other (See Comments)    Elevated potassium hyperkalemia  . Hctz [Hydrochlorothiazide] Other (See Comments)    Swelling and dyspnea   . Hydralazine     Chest pain, GI issues, shortness of breath  . Metformin And Related Other (See Comments)    Unknown reaction  . Prednisone     Suicidal thoughts  . Codeine Nausea Only  . Oxycodone-Acetaminophen Nausea Only    Review of Systems  Constitutional: Negative for fever and malaise/fatigue.  HENT: Negative for congestion.   Eyes: Negative for blurred vision.  Respiratory: Negative for shortness of breath.   Cardiovascular: Negative for chest pain, palpitations and leg swelling.  Gastrointestinal: Negative for abdominal pain, blood in stool and nausea.  Genitourinary: Negative for dysuria and frequency.  Musculoskeletal: Positive for falls and neck pain.  Skin: Positive for rash.  Neurological: Negative for dizziness, loss of consciousness and headaches.  Endo/Heme/Allergies: Negative for environmental allergies.  Psychiatric/Behavioral: Negative for depression. The patient is not  nervous/anxious.        Objective:    Physical Exam Vitals signs and nursing note reviewed.  Constitutional:      General: He is not in acute distress.    Appearance: He is well-developed.  HENT:     Head: Normocephalic and atraumatic.     Right Ear: Tympanic membrane and ear canal normal.     Left Ear: Tympanic membrane and ear canal normal.     Nose: Nose normal. No congestion.     Mouth/Throat:     Mouth: Mucous membranes are moist.     Pharynx: Oropharynx is clear.  Eyes:     General:        Right eye: No discharge.        Left eye: No discharge.     Conjunctiva/sclera: Conjunctivae normal.     Pupils: Pupils are equal, round, and reactive to light.  Neck:     Musculoskeletal: Normal range of motion and neck supple.  Cardiovascular:     Rate and Rhythm: Normal rate and regular rhythm.     Heart sounds: No murmur.  Pulmonary:     Effort: Pulmonary effort is normal.     Breath sounds: Normal breath sounds. No wheezing.  Abdominal:     General: Bowel sounds are normal. There is no distension.     Palpations: Abdomen is soft.     Tenderness: There is no abdominal tenderness. There is no guarding or rebound.  Skin:    General: Skin is warm and dry.     Comments: 1-2 cm round raised erythematous plaque over right lateral malleolus.  Neurological:     Mental Status: He is alert and oriented to  person, place, and time.     There were no vitals taken for this visit. Wt Readings from Last 3 Encounters:  11/03/17 156 lb 12.8 oz (71.1 kg)  09/22/17 167 lb (75.8 kg)  08/17/17 165 lb (74.8 kg)     Lab Results  Component Value Date   WBC 6.0 09/22/2017   HGB 12.2 (L) 09/22/2017   HCT 35.5 (L) 09/22/2017   PLT 257.0 09/22/2017   GLUCOSE 95 04/26/2017   CHOL 99 04/26/2017   TRIG 70.0 04/26/2017   HDL 42.00 04/26/2017   LDLCALC 43 04/26/2017   ALT 7 04/26/2017   AST 11 04/26/2017   NA 140 04/26/2017   K 4.5 04/26/2017   CL 106 04/26/2017   CREATININE 1.03  04/26/2017   BUN 17 04/26/2017   CO2 28 04/26/2017   TSH 4.64 (H) 04/26/2017   PSA 0.00 Repeated and verified X2. (L) 09/22/2017   INR 0.97 02/06/2010   HGBA1C 5.8 04/26/2017    Lab Results  Component Value Date   TSH 4.64 (H) 04/26/2017   Lab Results  Component Value Date   WBC 6.0 09/22/2017   HGB 12.2 (L) 09/22/2017   HCT 35.5 (L) 09/22/2017   MCV 87.6 09/22/2017   PLT 257.0 09/22/2017   Lab Results  Component Value Date   NA 140 04/26/2017   K 4.5 04/26/2017   CO2 28 04/26/2017   GLUCOSE 95 04/26/2017   BUN 17 04/26/2017   CREATININE 1.03 04/26/2017   BILITOT 0.5 04/26/2017   ALKPHOS 53 04/26/2017   AST 11 04/26/2017   ALT 7 04/26/2017   PROT 6.2 04/26/2017   ALBUMIN 3.7 04/26/2017   CALCIUM 8.8 04/26/2017   ANIONGAP 8 12/27/2016   GFR 72.83 04/26/2017   Lab Results  Component Value Date   CHOL 99 04/26/2017   Lab Results  Component Value Date   HDL 42.00 04/26/2017   Lab Results  Component Value Date   LDLCALC 43 04/26/2017   Lab Results  Component Value Date   TRIG 70.0 04/26/2017   Lab Results  Component Value Date   CHOLHDL 2 04/26/2017   Lab Results  Component Value Date   HGBA1C 5.8 04/26/2017       Assessment & Plan:   Problem List Items Addressed This Visit    Vitamin D deficiency    Supplement and monitor      Relevant Orders   VITAMIN D 25 Hydroxy (Vit-D Deficiency, Fractures)   HLD (hyperlipidemia)   Relevant Orders   Lipid panel   Anemia    Continue to monitor      HYPERTENSION, BENIGN ESSENTIAL    Well controlled, no changes to meds. Encouraged heart healthy diet such as the DASH diet and exercise as tolerated.       Relevant Orders   CBC   TSH   Comprehensive metabolic panel   Hyperglycemia    hgba1c acceptable, minimize simple carbs. Increase exercise as tolerated.       Relevant Orders   Hemoglobin A1c   Preventative health care - Primary    Patient encouraged to maintain heart healthy diet, regular  exercise, adequate sleep. Consider daily probiotics. Take medications as prescribed. Immunizations up to date but encouraged to receive Shingrix shots at pharmacy      Constipation    Encouraged increased hydration and fiber in diet. Daily probiotics. If bowels not moving can use MOM 2 tbls po in 4 oz of warm prune juice by mouth every 2-3 days.  If no results then repeat in 4 hours with  Dulcolax suppository pr, may repeat again in 4 more hours as needed. Seek care if symptoms worsen. Consider daily Miralax and/or Dulcolax if symptoms persist. Try mixing Miralax and Benefiber together and take daily or twice daily      Low back pain    Encouraged moist heat and gentle stretching as tolerated. May try NSAIDs and prescription meds as directed and report if symptoms worsen or seek immediate care. Tramadol works well without concerning side effects      Ankle pain    Ice, elevate and report worsening symptoms. Xray unremakrable except for arthritic changes      Relevant Orders   DG Ankle Complete Right (Completed)   Uric acid   Neck pain    Encouraged moist heat and gentle stretching as tolerated. May try NSAIDs and prescription meds as directed and report if symptoms worsen or seek immediate care. Xray does not show any new concerns. Referred for further evaluation      Relevant Orders   DG Cervical Spine Complete (Completed)   Cellulitis of right lower extremity    Spot over right lateral malleolus. Start Keflex and if no response will need to return to dermatology for further consideration.      Relevant Orders   DG Ankle Complete Right (Completed)    Other Visit Diagnoses    High risk medication use       Relevant Orders   Pain Mgmt, Profile 8 w/Conf, U      I am having Elyn Aquas. Tess start on cephALEXin. I am also having him maintain his polyethylene glycol, docusate sodium, triamcinolone cream, cholecalciferol, albuterol, fluticasone, Vitamin D (Ergocalciferol), FLUoxetine,  aspirin EC, polyvinyl alcohol, spironolactone, pantoprazole, amLODipine, minoxidil, metoCLOPramide, cloNIDine, fluticasone, benzonatate, sulfamethoxazole-trimethoprim, clopidogrel, atorvastatin, ciprofloxacin, gabapentin, and traMADol.  Meds ordered this encounter  Medications  . cephALEXin (KEFLEX) 500 MG capsule    Sig: Take 1 capsule (500 mg total) by mouth 4 (four) times daily.    Dispense:  40 capsule    Refill:  0     Penni Homans, MD

## 2018-01-11 NOTE — Assessment & Plan Note (Signed)
Encouraged increased hydration and fiber in diet. Daily probiotics. If bowels not moving can use MOM 2 tbls po in 4 oz of warm prune juice by mouth every 2-3 days. If no results then repeat in 4 hours with  Dulcolax suppository pr, may repeat again in 4 more hours as needed. Seek care if symptoms worsen. Consider daily Miralax and/or Dulcolax if symptoms persist. Try mixing Miralax and Benefiber together and take daily or twice daily

## 2018-01-11 NOTE — Assessment & Plan Note (Signed)
hgba1c acceptable, minimize simple carbs. Increase exercise as tolerated.  

## 2018-01-11 NOTE — Assessment & Plan Note (Signed)
Well controlled, no changes to meds. Encouraged heart healthy diet such as the DASH diet and exercise as tolerated.  °

## 2018-01-11 NOTE — Assessment & Plan Note (Signed)
Patient encouraged to maintain heart healthy diet, regular exercise, adequate sleep. Consider daily probiotics. Take medications as prescribed. Immunizations up to date but encouraged to receive Shingrix shots at pharmacy

## 2018-01-13 ENCOUNTER — Other Ambulatory Visit (INDEPENDENT_AMBULATORY_CARE_PROVIDER_SITE_OTHER): Payer: PPO

## 2018-01-13 DIAGNOSIS — I10 Essential (primary) hypertension: Secondary | ICD-10-CM | POA: Diagnosis not present

## 2018-01-13 DIAGNOSIS — E559 Vitamin D deficiency, unspecified: Secondary | ICD-10-CM

## 2018-01-13 DIAGNOSIS — L03115 Cellulitis of right lower limb: Secondary | ICD-10-CM | POA: Insufficient documentation

## 2018-01-13 DIAGNOSIS — R739 Hyperglycemia, unspecified: Secondary | ICD-10-CM

## 2018-01-13 DIAGNOSIS — M25579 Pain in unspecified ankle and joints of unspecified foot: Secondary | ICD-10-CM | POA: Diagnosis not present

## 2018-01-13 DIAGNOSIS — Z79899 Other long term (current) drug therapy: Secondary | ICD-10-CM

## 2018-01-13 DIAGNOSIS — M542 Cervicalgia: Secondary | ICD-10-CM | POA: Insufficient documentation

## 2018-01-13 DIAGNOSIS — E782 Mixed hyperlipidemia: Secondary | ICD-10-CM

## 2018-01-13 LAB — HEMOGLOBIN A1C: Hgb A1c MFr Bld: 5.6 % (ref 4.6–6.5)

## 2018-01-13 LAB — COMPREHENSIVE METABOLIC PANEL
ALK PHOS: 53 U/L (ref 39–117)
ALT: 5 U/L (ref 0–53)
AST: 12 U/L (ref 0–37)
Albumin: 4 g/dL (ref 3.5–5.2)
BUN: 13 mg/dL (ref 6–23)
CO2: 29 mEq/L (ref 19–32)
CREATININE: 0.93 mg/dL (ref 0.40–1.50)
Calcium: 9 mg/dL (ref 8.4–10.5)
Chloride: 106 mEq/L (ref 96–112)
GFR: 81.8 mL/min (ref >60.00)
Glucose, Bld: 121 mg/dL — ABNORMAL HIGH (ref 70–99)
POTASSIUM: 4.4 meq/L (ref 3.5–5.1)
Sodium: 141 mEq/L (ref 135–145)
Total Bilirubin: 0.6 mg/dL (ref 0.2–1.2)
Total Protein: 6.2 g/dL (ref 6.0–8.3)

## 2018-01-13 LAB — TSH: TSH: 1.94 u[IU]/mL (ref 0.35–4.50)

## 2018-01-13 LAB — CBC
HCT: 38.1 % — ABNORMAL LOW (ref 39.0–52.0)
Hemoglobin: 13 g/dL (ref 13.0–17.0)
MCHC: 34.2 g/dL (ref 30.0–36.0)
MCV: 88.9 fl (ref 78.0–100.0)
Platelets: 276 10*3/uL (ref 150.0–400.0)
RBC: 4.28 Mil/uL (ref 4.22–5.81)
RDW: 14.6 % (ref 11.5–15.5)
WBC: 7 10*3/uL (ref 4.0–10.5)

## 2018-01-13 LAB — LIPID PANEL
Cholesterol: 114 mg/dL (ref 0–200)
HDL: 46.5 mg/dL (ref >39.00)
LDL Cholesterol: 50 mg/dL (ref 0–99)
NONHDL: 67.09
Total CHOL/HDL Ratio: 2
Triglycerides: 85 mg/dL (ref 0.0–149.0)
VLDL: 17 mg/dL (ref 0.0–40.0)

## 2018-01-13 LAB — URIC ACID: URIC ACID, SERUM: 5.9 mg/dL (ref 4.0–7.8)

## 2018-01-13 LAB — VITAMIN D 25 HYDROXY (VIT D DEFICIENCY, FRACTURES): VITD: 26.22 ng/mL — ABNORMAL LOW (ref 30.00–100.00)

## 2018-01-13 NOTE — Assessment & Plan Note (Signed)
Ice, elevate and report worsening symptoms. Xray unremakrable except for arthritic changes

## 2018-01-13 NOTE — Assessment & Plan Note (Addendum)
Encouraged moist heat and gentle stretching as tolerated. May try NSAIDs and prescription meds as directed and report if symptoms worsen or seek immediate care. Xray does not show any new concerns. Referred for further evaluation

## 2018-01-13 NOTE — Assessment & Plan Note (Signed)
Spot over right lateral malleolus. Start Keflex and if no response will need to return to dermatology for further consideration.

## 2018-01-14 LAB — PAIN MGMT, PROFILE 8 W/CONF, U
6 Acetylmorphine: NEGATIVE ng/mL (ref <10)
Alcohol Metabolites: NEGATIVE ng/mL (ref <500)
Amphetamines: NEGATIVE ng/mL (ref <500)
Benzodiazepines: NEGATIVE ng/mL (ref <100)
Buprenorphine, Urine: NEGATIVE ng/mL (ref <5)
Cocaine Metabolite: NEGATIVE ng/mL (ref <150)
Creatinine: 76.8 mg/dL
MDMA: NEGATIVE ng/mL (ref <500)
Marijuana Metabolite: NEGATIVE ng/mL (ref <20)
Opiates: NEGATIVE ng/mL (ref <100)
Oxidant: NEGATIVE ug/mL (ref <200)
Oxycodone: NEGATIVE ng/mL (ref <100)
pH: 6.45 (ref 4.5–9.0)

## 2018-01-18 ENCOUNTER — Encounter: Payer: PPO | Admitting: Family Medicine

## 2018-01-19 MED FILL — PANTOPRAZOLE SOD DR 40 MG T: 40 | 90 days supply | Qty: 90 | Fill #3

## 2018-01-24 ENCOUNTER — Other Ambulatory Visit: Payer: Self-pay | Admitting: Family Medicine

## 2018-01-24 DIAGNOSIS — M549 Dorsalgia, unspecified: Secondary | ICD-10-CM

## 2018-01-24 DIAGNOSIS — I6350 Cerebral infarction due to unspecified occlusion or stenosis of unspecified cerebral artery: Secondary | ICD-10-CM

## 2018-01-24 DIAGNOSIS — I10 Essential (primary) hypertension: Secondary | ICD-10-CM

## 2018-01-24 DIAGNOSIS — E559 Vitamin D deficiency, unspecified: Secondary | ICD-10-CM

## 2018-01-24 DIAGNOSIS — E782 Mixed hyperlipidemia: Secondary | ICD-10-CM

## 2018-01-24 MED FILL — ATORVASTATIN 40 MG TABLET: 40 | 90 days supply | Qty: 90 | Fill #0

## 2018-01-24 MED FILL — CloNIDine HCL 0.1 MG TAB: 0.1 | 30 days supply | Qty: 120 | Fill #7

## 2018-01-25 NOTE — Telephone Encounter (Signed)
Requesting:tramadol Contract:yes UDS:01/13/18 low risk next screen 07/14/18 Last OV:01/11/18 Next OV:07/12/18 Last Refill:01/03/18  #[id number] Database:   Please advise

## 2018-01-26 MED FILL — traMADol HCL 50 MG TABS: 50 | 20 days supply | Qty: 60 | Fill #0

## 2018-01-31 MED FILL — MINOXIDIL 10 MG TABLET: 10 | 30 days supply | Qty: 30 | Fill #11

## 2018-01-31 MED FILL — CLOPIDOGREL 75 MG TABLET: 75 | 90 days supply | Qty: 90 | Fill #1

## 2018-02-02 MED FILL — METOCLOPRAMIDE 5 MG TABLET: 5 | 30 days supply | Qty: 30 | Fill #10

## 2018-02-14 ENCOUNTER — Other Ambulatory Visit: Payer: Self-pay | Admitting: Family Medicine

## 2018-02-14 DIAGNOSIS — M549 Dorsalgia, unspecified: Secondary | ICD-10-CM

## 2018-02-14 MED FILL — traMADol HCL 50 MG TABS: 50 | 20 days supply | Qty: 60 | Fill #0

## 2018-02-14 NOTE — Telephone Encounter (Signed)
Requesting:tramadol Contract:yes UDS:low risk next screen 07/14/18 Last OV:01/11/18 Next OV:07/12/18 Last Refill:01/25/18  #[id number] Database:   Please advise

## 2018-02-23 ENCOUNTER — Ambulatory Visit (INDEPENDENT_AMBULATORY_CARE_PROVIDER_SITE_OTHER): Payer: PPO | Admitting: Medical

## 2018-02-23 ENCOUNTER — Encounter: Payer: Self-pay | Admitting: Medical

## 2018-02-23 ENCOUNTER — Telehealth: Payer: Self-pay | Admitting: Medical

## 2018-02-23 ENCOUNTER — Ambulatory Visit (HOSPITAL_BASED_OUTPATIENT_CLINIC_OR_DEPARTMENT_OTHER)
Admission: RE | Admit: 2018-02-23 | Discharge: 2018-02-23 | Disposition: A | Discharge status: Home / Self Care | Payer: PPO | Source: Ambulatory Visit | Attending: Medical | Admitting: Medical

## 2018-02-23 VITALS — BP 175/70 | HR 73 | Temp 98.5°F | Resp 16 | Ht 71.0 in | Wt 161.0 lb

## 2018-02-23 DIAGNOSIS — R509 Fever, unspecified: Secondary | ICD-10-CM | POA: Diagnosis not present

## 2018-02-23 DIAGNOSIS — J181 Lobar pneumonia, unspecified organism: Secondary | ICD-10-CM | POA: Diagnosis not present

## 2018-02-23 DIAGNOSIS — R110 Nausea: Secondary | ICD-10-CM | POA: Diagnosis not present

## 2018-02-23 LAB — POC URINALSYSI DIPSTICK (AUTOMATED)
Glucose, UA: NEGATIVE
Ketones, UA: NEGATIVE
LEUKOCYTES UA: NEGATIVE
NITRITE UA: NEGATIVE
Protein, UA: POSITIVE — AB
Spec Grav, UA: 1.02 (ref 1.010–1.025)
Urobilinogen, UA: NEGATIVE E.U./dL — AB
pH, UA: 5 (ref 5.0–8.0)

## 2018-02-23 MED ORDER — ONDANSETRON 4 MG PO TBDP: 4.0000 mg | ORAL_TABLET | Freq: Three times a day (TID) | ORAL | 0 refills | Status: DC | PRN

## 2018-02-23 MED ORDER — DOXYCYCLINE HYCLATE 100 MG PO TABS: 100.0000 mg | ORAL_TABLET | Freq: Two times a day (BID) | ORAL | 0 refills | Status: DC

## 2018-02-23 MED ORDER — OSELTAMIVIR PHOSPHATE 75 MG PO CAPS: 75.0000 mg | ORAL_CAPSULE | Freq: Two times a day (BID) | ORAL | 0 refills | Status: DC

## 2018-02-23 MED FILL — ONDANSETRON ODT 4 MG TABLET: 4 | 7 days supply | Qty: 20 | Fill #0

## 2018-02-23 NOTE — Progress Notes (Signed)
Subjective:    Patient ID: [name], male    DOB: 1929/04/03, 85 y.o.   MRN: [id number]  HPI Pt in with report of 103 last night. This morning temp 102. He has some faint achiness to left thigh. He states no cough. No abdomen pain. Mild nausea yesterday. Nausea less today.   Pt had on loose stools after milk of magnesia. He felt constipated so he took mom.  He struggles with constipation. 3 loose stools today after mom and miralax. No other loose stools. His wife just got over c dif.  No pain on urination. He vomited twice last night. Wife gave him zofran.    Review of Systems  Constitutional: Positive for fatigue and fever. Negative for chills.  HENT: Negative for congestion, ear pain, sinus pressure and sore throat.   Respiratory: Negative for cough, chest tightness, shortness of breath and wheezing.   Cardiovascular: Negative for chest pain and palpitations.  Gastrointestinal: Positive for nausea and vomiting. Negative for abdominal pain and diarrhea.  Genitourinary: Negative for dysuria, frequency and urgency.  Musculoskeletal: Negative for back pain.       Myalgia left thigh mild today.  Skin: Negative for rash.  Neurological: Positive for weakness. Negative for dizziness, numbness and headaches.  Psychiatric/Behavioral: Negative for agitation, behavioral problems, confusion and self-injury. The patient is not nervous/anxious.     Past Medical History:  Diagnosis Date  . Acute bronchitis 06/10/2015  . Allergic rhinitis   . Anemia   . Asthma   . Carotid stenosis    a. s/p Left CEA 2002;  b. Carotid US 3/16:  patent R CEA, L < 40%  . Chronic pain syndrome    Left shoulder, back  . Diverticulosis   . Dyslipidemia   . Epigastric pain 04/05/2016  . Esophageal stricture    Distal, benign  . GERD (gastroesophageal reflux disease)   . Hemorrhoids   . HTN (hypertension)    Negative renal duplex 07-22-11  . Hx of echocardiogram    a. Echo 10/11: mod LVH, EF 55-60%  .  Pneumonia 07-2013  . Prostate cancer ([place]) 2000   Seed XRT  . Spondylosis, cervical, with myelopathy 11/26/2015  . Stroke [facility]) 8/01   right thalamic - on chronic Plavix/ASA     Social History   Socioeconomic History  . Marital status: Married    Spouse name: Not on file  . Number of children: 1  . Years of education: 90  . Highest education level: Not on file  Occupational History  . Occupation: Retired  Scientific laboratory technician  . Financial resource strain: Not on file  . Food insecurity:    Worry: Not on file    Inability: Not on file  . Transportation needs:    Medical: Not on file    Non-medical: Not on file  Tobacco Use  . Smoking status: Former Smoker    Packs/day: 0.25    Years: 35.00    Pack years: 8.75    Types: Cigarettes    Last attempt to quit: 01/12/1978    Years since quitting: 40.1  . Smokeless tobacco: Current User    Types: Chew  Substance and Sexual Activity  . Alcohol use: No    Comment: Prior alcoholic, sober since 7412  . Drug use: No  . Sexual activity: Not Currently  Lifestyle  . Physical activity:    Days per week: Not on file    Minutes per session: Not on file  . Stress: Not  on file  Relationships  . Social connections:    Talks on phone: Not on file    Gets together: Not on file    Attends religious service: Not on file    Active member of club or organization: Not on file    Attends meetings of clubs or organizations: Not on file    Relationship status: Not on file  . Intimate partner violence:    Fear of current or ex partner: Not on file    Emotionally abused: Not on file    Physically abused: Not on file    Forced sexual activity: Not on file  Other Topics Concern  . Not on file  Social History Narrative   Lives at home w/ his wife   Right-handed    Caffeine: drinks a lot of coffee per report    Past Surgical History:  Procedure Laterality Date  . APPENDECTOMY  1953  . CAROTID ENDARTERECTOMY Right 01/2000  . CATARACT EXTRACTION  Bilateral   . CERVICAL DISC SURGERY  8/07  . INSERTION PROSTATE RADIATION SEED  2000  . [place] SURGERY  1990s  . NASAL SINUS SURGERY     x 3    Family History  Problem Relation Age of Onset  . Stroke Brother   . Stroke Sister   . Stroke Mother   . Hyperlipidemia Mother   . Hypertension Mother   . Lung disease Father   . Diabetes Son   . Gout Son   . Obesity Sister   . Alcohol abuse Brother   . Cancer Brother        lung, smoker    Allergies  Allergen Reactions  . Diovan [Valsartan] Other (See Comments)    Elevated potassium hyperkalemia  . Hctz [Hydrochlorothiazide] Other (See Comments)    Swelling and dyspnea   . Hydralazine     Chest pain, GI issues, shortness of breath  . Metformin And Related Other (See Comments)    Unknown reaction  . Prednisone     Suicidal thoughts  . Codeine Nausea Only  . Oxycodone-Acetaminophen Nausea Only    Current Outpatient Medications on File Prior to Visit  Medication Sig Dispense Refill  . albuterol (PROVENTIL HFA;VENTOLIN HFA) 108 (90 Base) MCG/ACT inhaler Inhale 1-2 puffs into the lungs every 6 (six) hours as needed for wheezing or shortness of breath. 1 Inhaler 6  . amLODipine (NORVASC) 5 MG tablet TAKE 1 TABLET BY MOUTH TWICE A DAY 180 tablet 3  . aspirin EC 325 MG tablet Take 325 mg by mouth daily.    [name] Kitchen atorvastatin (LIPITOR) 40 MG tablet TAKE 1 TABLET BY MOUTH DAILY. 90 tablet 1  . benzonatate (TESSALON) 100 MG capsule Take 1 capsule (100 mg total) by mouth 3 (three) times daily as needed for cough. 30 capsule 0  . cephALEXin (KEFLEX) 500 MG capsule Take 1 capsule (500 mg total) by mouth 4 (four) times daily. 40 capsule 0  . cholecalciferol (VITAMIN D) 1000 UNITS tablet Take 1,000 Units by mouth daily.    . ciprofloxacin (CIPRO) 500 MG tablet Take 1 tablet (500 mg total) by mouth 2 (two) times daily. 14 tablet 0  . cloNIDine (CATAPRES) 0.1 MG tablet Take 1 tablet by mouth 3 to 4 times by mouth each day. 120 tablet 11  .  clopidogrel (PLAVIX) 75 MG tablet TAKE 1 TABLET BY MOUTH ONCE DAILY 90 tablet 1  . docusate sodium (COLACE) 100 MG capsule Take 100 mg by mouth 2 (two) times  daily as needed (constipation).     [name] Kitchen FLUoxetine (PROZAC) 10 MG tablet Take 1 tablet (10 mg total) by mouth daily. 30 tablet 3  . fluticasone (FLONASE) 50 MCG/ACT nasal spray PLACE 2 SPRAYS INTO BOTH NOSTRILS DAILY. (Patient taking differently: PLACE 2 SPRAYS INTO BOTH NOSTRILS DAILY AS NEEDED FOR CONGESTION) 16 g 6  . fluticasone (FLOVENT HFA) 110 MCG/ACT inhaler Inhale 2 puffs into the lungs 2 (two) times daily. 1 Inhaler 0  . gabapentin (NEURONTIN) 300 MG capsule TAKE 1 CAPSULE BY MOUTH 3 TIMES DAILY 270 capsule 1  . metoCLOPramide (REGLAN) 5 MG tablet TAKE 1 TABLET (5 MG) BY MOUTH AT BEDTIME 30 tablet 11  . minoxidil (LONITEN) 10 MG tablet Take 10 mg by mouth daily. 30 tablet 11  . pantoprazole (PROTONIX) 40 MG tablet Take 1 tablet (40 mg total) by mouth daily before breakfast. 90 tablet 3  . polyethylene glycol (MIRALAX / GLYCOLAX) packet Take 17 g by mouth daily as needed for mild constipation. Mix in 8 oz liquid and drink    . polyvinyl alcohol (ARTIFICIAL TEARS) 1.4 % ophthalmic solution Place 1 drop into both eyes daily as needed for dry eyes.    [name] Kitchen spironolactone (ALDACTONE) 50 MG tablet Take 0.5 tablets (25 mg total) by mouth daily. 30 tablet 11  . sulfamethoxazole-trimethoprim (BACTRIM DS,SEPTRA DS) 800-160 MG tablet Take 1 tablet by mouth 2 (two) times daily. 20 tablet 0  . traMADol (ULTRAM) 50 MG tablet TAKE 1 TABLET BY MOUTH EVERY 8 HOURS AS NEEDED 60 tablet 0  . triamcinolone cream (KENALOG) 0.5 % Apply 1 application topically daily as needed (for skin irritation).     . Vitamin D, Ergocalciferol, (DRISDOL) 50000 units CAPS capsule Take 1 capsule (50,000 Units total) by mouth every 7 (seven) days. (Patient taking differently: Take 50,000 Units by mouth every Friday. ) 4 capsule 8   No current facility-administered medications on  file prior to visit.     BP (!) 180/59 (BP Location: Right Arm, Patient Position: Sitting, Cuff Size: Small)   Pulse 73   Temp 98.5 F (36.9 C) (Oral)   Resp 16   Ht 5\' 11"  (1.803 m)   Wt 161 lb (73 kg)   SpO2 98%   BMI 22.45 kg/m       Objective:   Physical Exam  General Appearance- Not in acute distress.  HEENT Eyes- Scleraeral/Conjuntiva-bilat- Not Yellow. Mouth & Throat- Normal.  Chest and Lung Exam Auscultation: Breath sounds:-Normal. Adventitious sounds:- No Adventitious sounds.  Cardiovascular Auscultation:Rythm - Regular. Heart Sounds -Normal heart sounds.  Abdomen Inspection:-Inspection Normal.  Palpation/Perucssion: Palpation and Percussion of the abdomen reveal- Non Tender, No Rebound tenderness, No rigidity(Guarding) and No Palpable abdominal masses.  Liver:-Normal.  Spleen:- Normal.   Back- no cva pain.  Rt lower ext- lateral aspect of ankle. Mild pink. Small scab in center.  Not real tender. No dc.  Left thigh- on palpation no tenderness.      Assessment & Plan:  You have early fever with some mild nausea.  No other signs and symptoms that clearly indicate a source of infection.  Your flu test was negative.  Flu test can come back falsely negative so we need to be updated if your signs and symptoms change or worsen.  Particularly if he were to get diffuse muscle aches.  In that event would likely go ahead and prescribe Tamiflu to treat within treatment window timeframe.  We will get urinalysis today to check for infection.  Decided  to go ahead and do a urine culture as well.  I did order labs to include CBC, CMP, amylase, lipase, PSA and asked to add on lactate.  Difficult draw in the lab.  Veins blew.  He is likely dehydrated.  Lab staff notify me she could not send out lactate.  I am going to give you a print prescription of Tamiflu in the event that you start to have diffuse muscle aches.  Make sure you stay well-hydrated.  Eat bland foods.   Zofran for nausea.  You really did not have diarrhea/loose stool since you use some milk of magnesia to relieve some constipation.  However I do want you to be very careful and notify me if you start to have loose stools as your wife recently had C. difficile.  So if you have any loose watery stools let me know and we will need to do gastro panel.  If signs symptoms worsen change then recommend ED evaluation.  Follow-up  4 days or as needed.  Go ahead and schedule appointment for this coming Monday.  4o minutes spent with pt. 50% of time spent counseling on approach to determine diagnosis and made 1 day fever with no revealing associated signs or symptoms indicating cause of presently.  Patient's daughter came in towards the end of exam as well and explained treatment plan/work-up as well.  Mackie Pai, PA-C

## 2018-02-23 NOTE — Telephone Encounter (Signed)
Rx doxy sent in. Notified pt. Dsicussed with wife.

## 2018-02-23 NOTE — Patient Instructions (Addendum)
You have early fever with some mild nausea.  No other signs and symptoms that clearly indicate a source of infection.  Your flu test was negative.  Flu test can come back falsely negative so we need to be updated if your signs and symptoms change or worsen.  Particularly if he were to get diffuse muscle aches.  In that event would likely go ahead and prescribe Tamiflu to treat within treatment window timeframe.  We will get urinalysis today to check for infection.  Decided to go ahead and do a urine culture as well.  I did order labs to include CBC, CMP, amylase, lipase, PSA and asked to add on lactate.  Difficult draw in the lab.  Veins blew.  He is likely dehydrated.  Lab staff notify me she could not send out lactate.  I am going to give you a print prescription of Tamiflu in the event that you start to have diffuse muscle aches.  Make sure you stay well-hydrated.  Eat bland foods.  Zofran for nausea.  You really did not have diarrhea/loose stool since you use some milk of magnesia to relieve some constipation.  However I do want you to be very careful and notify me if you start to have loose stools as your wife recently had C. difficile.  So if you have any loose watery stools let me know and we will need to do gastro panel.  If signs symptoms worsen change then recommend ED evaluation.  Follow-up  4 days or as needed.  Go ahead and schedule appointment for this coming Monday.

## 2018-02-24 ENCOUNTER — Other Ambulatory Visit: Payer: Self-pay

## 2018-02-24 ENCOUNTER — Telehealth: Payer: Self-pay | Admitting: Emergency Medicine

## 2018-02-24 ENCOUNTER — Encounter (HOSPITAL_COMMUNITY): Payer: Self-pay

## 2018-02-24 ENCOUNTER — Emergency Department (HOSPITAL_COMMUNITY)
Admission: EM | Admit: 2018-02-24 | Discharge: 2018-02-24 | Disposition: A | Discharge status: Home / Self Care | Payer: PPO | Attending: Emergency Medicine | Admitting: Emergency Medicine

## 2018-02-24 ENCOUNTER — Telehealth: Payer: Self-pay

## 2018-02-24 ENCOUNTER — Telehealth: Payer: Self-pay | Admitting: Medical

## 2018-02-24 DIAGNOSIS — Z87891 Personal history of nicotine dependence: Secondary | ICD-10-CM | POA: Diagnosis not present

## 2018-02-24 DIAGNOSIS — R509 Fever, unspecified: Secondary | ICD-10-CM | POA: Diagnosis present

## 2018-02-24 DIAGNOSIS — I4891 Unspecified atrial fibrillation: Secondary | ICD-10-CM | POA: Diagnosis not present

## 2018-02-24 DIAGNOSIS — J181 Lobar pneumonia, unspecified organism: Secondary | ICD-10-CM | POA: Insufficient documentation

## 2018-02-24 DIAGNOSIS — J45909 Unspecified asthma, uncomplicated: Secondary | ICD-10-CM | POA: Insufficient documentation

## 2018-02-24 DIAGNOSIS — Z7901 Long term (current) use of anticoagulants: Secondary | ICD-10-CM | POA: Diagnosis not present

## 2018-02-24 DIAGNOSIS — Z79899 Other long term (current) drug therapy: Secondary | ICD-10-CM | POA: Insufficient documentation

## 2018-02-24 DIAGNOSIS — J189 Pneumonia, unspecified organism: Secondary | ICD-10-CM

## 2018-02-24 DIAGNOSIS — I10 Essential (primary) hypertension: Secondary | ICD-10-CM | POA: Diagnosis not present

## 2018-02-24 LAB — COMPREHENSIVE METABOLIC PANEL
ALT: 6 U/L (ref 0–53)
ALT: 11 U/L (ref 0–44)
AST: 19 U/L (ref 0–37)
AST: 32 U/L (ref 15–41)
Albumin: 3.9 g/dL (ref 3.5–5.2)
Albumin: 3.4 g/dL — ABNORMAL LOW (ref 3.5–5.0)
Alkaline Phosphatase: 54 U/L (ref 39–117)
Alkaline Phosphatase: 52 U/L (ref 38–126)
Anion gap: 7 (ref 5–15)
BUN: 19 mg/dL (ref 6–23)
BUN: 23 mg/dL (ref 8–23)
CHLORIDE: 103 meq/L (ref 96–112)
CHLORIDE: 104 mmol/L (ref 98–111)
CO2: 27 meq/L (ref 19–32)
CO2: 27 mmol/L (ref 22–32)
Calcium: 9 mg/dL (ref 8.4–10.5)
Calcium: 8.7 mg/dL — ABNORMAL LOW (ref 8.9–10.3)
Creatinine, Ser: 1.09 mg/dL (ref 0.40–1.50)
Creatinine, Ser: 1.33 mg/dL — ABNORMAL HIGH (ref 0.61–1.24)
GFR calc Af Amer: 56 mL/min — ABNORMAL LOW (ref >60)
GFR, EST NON AFRICAN AMERICAN: 48 mL/min — AB (ref >60)
GFR: 64.06 mL/min (ref >60.00)
Glucose, Bld: 111 mg/dL — ABNORMAL HIGH (ref 70–99)
Glucose, Bld: 156 mg/dL — ABNORMAL HIGH (ref 70–99)
Potassium: 4.6 mEq/L (ref 3.5–5.1)
Potassium: 3.7 mmol/L (ref 3.5–5.1)
Sodium: 140 mEq/L (ref 135–145)
Sodium: 138 mmol/L (ref 135–145)
Total Bilirubin: 0.7 mg/dL (ref 0.2–1.2)
Total Bilirubin: 0.3 mg/dL (ref 0.3–1.2)
Total Protein: 6.4 g/dL (ref 6.0–8.3)
Total Protein: 6.4 g/dL — ABNORMAL LOW (ref 6.5–8.1)

## 2018-02-24 LAB — CBC WITH DIFFERENTIAL/PLATELET
Abs Immature Granulocytes: 0.03 10*3/uL (ref 0.00–0.07)
BASOS PCT: 0 %
Basophils Absolute: 0.1 10*3/uL (ref 0.0–0.1)
Basophils Absolute: 0 10*3/uL (ref 0.0–0.1)
Basophils Relative: 0.6 % (ref 0.0–3.0)
Eosinophils Absolute: 0 10*3/uL (ref 0.0–0.7)
Eosinophils Absolute: 0 10*3/uL (ref 0.0–0.5)
Eosinophils Relative: 0 % (ref 0.0–5.0)
Eosinophils Relative: 0 %
HCT: 39.1 % (ref 39.0–52.0)
HCT: 38.8 % — ABNORMAL LOW (ref 39.0–52.0)
Hemoglobin: 13.2 g/dL (ref 13.0–17.0)
Hemoglobin: 12.6 g/dL — ABNORMAL LOW (ref 13.0–17.0)
Immature Granulocytes: 0 %
LYMPHS ABS: 0.9 10*3/uL (ref 0.7–4.0)
Lymphocytes Relative: 4.4 % — ABNORMAL LOW (ref 12.0–46.0)
Lymphocytes Relative: 7 %
Lymphs Abs: 0.7 10*3/uL (ref 0.7–4.0)
MCH: 29.1 pg (ref 26.0–34.0)
MCHC: 33.7 g/dL (ref 30.0–36.0)
MCHC: 32.5 g/dL (ref 30.0–36.0)
MCV: 88.7 fl (ref 78.0–100.0)
MCV: 89.6 fL (ref 80.0–100.0)
Monocytes Absolute: 0.6 10*3/uL (ref 0.1–1.0)
Monocytes Absolute: 0.5 10*3/uL (ref 0.1–1.0)
Monocytes Relative: 2.9 % — ABNORMAL LOW (ref 3.0–12.0)
Monocytes Relative: 5 %
Neutro Abs: 19.6 10*3/uL — ABNORMAL HIGH (ref 1.4–7.7)
Neutro Abs: 9.6 10*3/uL — ABNORMAL HIGH (ref 1.7–7.7)
Neutrophils Relative %: 92.1 % — ABNORMAL HIGH (ref 43.0–77.0)
Neutrophils Relative %: 88 %
Platelets: 241 10*3/uL (ref 150.0–400.0)
Platelets: 237 10*3/uL (ref 150–400)
RBC: 4.41 Mil/uL (ref 4.22–5.81)
RBC: 4.33 MIL/uL (ref 4.22–5.81)
RDW: 14.2 % (ref 11.5–15.5)
RDW: 13.4 % (ref 11.5–15.5)
WBC: 21.2 10*3/uL (ref 4.0–10.5)
WBC: 10.9 10*3/uL — ABNORMAL HIGH (ref 4.0–10.5)
nRBC: 0 % (ref 0.0–0.2)

## 2018-02-24 LAB — LACTIC ACID, PLASMA: Lactic Acid, Venous: 1.6 mmol/L (ref 0.5–1.9)

## 2018-02-24 LAB — PROTIME-INR
INR: 1.14
Prothrombin Time: 14.5 seconds (ref 11.4–15.2)

## 2018-02-24 LAB — URINE CULTURE
MICRO NUMBER:: 186134
SPECIMEN QUALITY:: ADEQUATE

## 2018-02-24 LAB — LIPASE: Lipase: 0 U/L — ABNORMAL LOW (ref 11.0–59.0)

## 2018-02-24 LAB — AMYLASE: Amylase: 23 U/L — ABNORMAL LOW (ref 27–131)

## 2018-02-24 MED ORDER — METOPROLOL SUCCINATE ER 25 MG PO TB24: 25.0000 mg | ORAL_TABLET | Freq: Every day | ORAL | 0 refills | Status: DC

## 2018-02-24 MED ORDER — APIXABAN 5 MG PO TABS: 5.0000 mg | ORAL_TABLET | Freq: Two times a day (BID) | ORAL | 0 refills | Status: DC

## 2018-02-24 MED ORDER — SODIUM CHLORIDE 0.9 % IV BOLUS
1000.0000 mL | Freq: Once | INTRAVENOUS | Status: AC
  Administered 2018-02-24: 1000 mL via INTRAVENOUS

## 2018-02-24 MED ORDER — APIXABAN 5 MG PO TABS
5.0000 mg | ORAL_TABLET | Freq: Once | ORAL | Status: DC
  Filled 2018-02-24: qty 1

## 2018-02-24 MED ORDER — METOPROLOL TARTRATE 5 MG/5ML IV SOLN
5.0000 mg | Freq: Once | INTRAVENOUS | Status: AC
  Administered 2018-02-24: 5 mg via INTRAVENOUS
  Filled 2018-02-24: qty 5

## 2018-02-24 NOTE — Telephone Encounter (Signed)
I have asked to talk with the patient's wife regards the pt (the patient daughter is not in the [facility]) The patient was seen yesterday with fever and nausea. Chest x-ray showed RML pneumonia. White count was 21K. He has been recommended to go to the ER twice.  The wife is extremely reluctant to go. I explained to her that [name] is 85 year old, with pneumonia and a white count of 21;  he is at risk of becoming septic with a terrible outcome. She said that she is a Marine scientist and she understands. At the end she agreed to take the pt to the ER for further eval and possible admission.

## 2018-02-24 NOTE — ED Provider Notes (Signed)
[facility] EMERGENCY DEPARTMENT Provider Note   CSN: [id number] Arrival date & time: 02/24/18  1610     History   Chief Complaint Chief Complaint  Patient presents with  . Pneumonia    HPI [name] is a 85 y.o. male.  85 yo M with a cc of fever and chills.  This been going on for the past 3 to 4 days.  The patient was seen by his family doctor yesterday and found to have pneumonia on an outpatient chest x-ray.  He was started on doxycycline and then called back this morning because his white blood cell count returned elevated at 21,000.  He had been doing well except at night when he feels that his fevers and chills are worse.  Having diffuse myalgias with that as well.  Does not endorse cough or congestion denies headache or neck pain has had a couple episodes of vomiting but denies abdominal pain or diarrhea.  Has been able to eat and drink.  Unsure of sick contacts.  The history is provided by the patient.  Pneumonia  Pertinent negatives include no chest pain, no abdominal pain, no headaches and no shortness of breath.  Illness  Severity:  Moderate Onset quality:  Gradual Duration:  4 days Timing:  Constant Progression:  Unchanged Chronicity:  New Associated symptoms: fever and myalgias   Associated symptoms: no abdominal pain, no chest pain, no congestion, no diarrhea, no headaches, no rash, no shortness of breath and no vomiting     Past Medical History:  Diagnosis Date  . Acute bronchitis 06/10/2015  . Allergic rhinitis   . Anemia   . Asthma   . Carotid stenosis    a. s/p Left CEA 2002;  b. Carotid US 3/16:  patent R CEA, L < 40%  . Chronic pain syndrome    Left shoulder, back  . Diverticulosis   . Dyslipidemia   . Epigastric pain 04/05/2016  . Esophageal stricture    Distal, benign  . GERD (gastroesophageal reflux disease)   . Hemorrhoids   . HTN (hypertension)    Negative renal duplex 07-22-11  . Hx of echocardiogram    a. Echo  10/11: mod LVH, EF 55-60%  . Pneumonia 07-2013  . Prostate cancer ([place]) 2000   Seed XRT  . Spondylosis, cervical, with myelopathy 11/26/2015  . 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 Ankle pain 01/13/2018  . Neck pain 01/13/2018  . Cellulitis of right lower extremity 01/13/2018  . Constipation 01/11/2018  . Low back pain 01/11/2018  . Dysuria 12/06/2016  . Weakness 12/06/2016  . 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 Preventative health care 12/06/2016  . Hyperglycemia 04/05/2016  . Epigastric pain 04/05/2016  . Spondylosis, cervical, with myelopathy 11/26/2015  . Cervicogenic headache 11/26/2015  . Umbilical hernia 54/27/0623  . Asthma 10/08/2011  . Carotid stenosis 03/31/2011  . Chronic pain   . Vitamin D deficiency 12/11/2008  . HLD (hyperlipidemia) 12/11/2008  . Anemia 12/11/2008  . Cerebral artery occlusion with cerebral infarction ([place]) 12/11/2008  . URINARY INCONTINENCE, MALE 12/11/2008  . Disorder of bursae and tendons in shoulder region 12/29/2007  . ANXIETY, MILD 08/09/2007  . HYPERTENSION, BENIGN ESSENTIAL 01/04/2007  . Allergic rhinitis 01/04/2007  . HEADACHE, TENSION 11/05/2006  . PROSTATE CANCER, HX OF 11/05/2006    Past Surgical History:  Procedure Laterality Date  .  APPENDECTOMY  1953  . CAROTID ENDARTERECTOMY Right 01/2000  . CATARACT EXTRACTION Bilateral   . CERVICAL DISC SURGERY  8/07  . INSERTION PROSTATE RADIATION SEED  2000  . [place] SURGERY  1990s  . NASAL SINUS SURGERY     x 3        Home Medications    Prior to Admission medications   Medication Sig Start Date End Date Taking? Authorizing Provider  albuterol (PROVENTIL HFA;VENTOLIN HFA) 108 (90 Base) MCG/ACT inhaler Inhale 1-2 puffs into the lungs every 6 (six) hours as needed for wheezing or shortness of breath. 03/02/15   Melony Overly, MD  amLODipine (NORVASC) 5 MG tablet TAKE 1 TABLET BY  MOUTH TWICE A DAY 03/03/17   Fay Records, MD  apixaban (ELIQUIS) 5 MG TABS tablet Take 1 tablet (5 mg total) by mouth 2 (two) times daily for 30 days. 02/24/18 03/26/18  Deno Etienne, DO  atorvastatin (LIPITOR) 40 MG tablet TAKE 1 TABLET BY MOUTH DAILY. 01/25/18   [name], MD  benzonatate (TESSALON) 100 MG capsule Take 1 capsule (100 mg total) by mouth 3 (three) times daily as needed for cough. 05/25/17   Saguier, Percell Miller, PA-C  cephALEXin (KEFLEX) 500 MG capsule Take 1 capsule (500 mg total) by mouth 4 (four) times daily. 01/11/18   [name], MD  cholecalciferol (VITAMIN D) 1000 UNITS tablet Take 1,000 Units by mouth daily.    [provider]  ciprofloxacin (CIPRO) 500 MG tablet Take 1 tablet (500 mg total) by mouth 2 (two) times daily. 11/03/17   Saguier, Percell Miller, PA-C  cloNIDine (CATAPRES) 0.1 MG tablet Take 1 tablet by mouth 3 to 4 times by mouth each day. 04/06/17   Fay Records, MD  docusate sodium (COLACE) 100 MG capsule Take 100 mg by mouth 2 (two) times daily as needed (constipation).     [provider]  doxycycline (VIBRA-TABS) 100 MG tablet Take 1 tablet (100 mg total) by mouth 2 (two) times daily. Can give caps or generic 02/23/18   Saguier, Percell Miller, PA-C  FLUoxetine (PROZAC) 10 MG tablet Take 1 tablet (10 mg total) by mouth daily. 12/01/16   [name], MD  fluticasone (FLONASE) 50 MCG/ACT nasal spray PLACE 2 SPRAYS INTO BOTH NOSTRILS DAILY. Patient taking differently: PLACE 2 SPRAYS INTO BOTH NOSTRILS DAILY AS NEEDED FOR CONGESTION 07/09/15   Hoyt Koch, MD  fluticasone (FLOVENT HFA) 110 MCG/ACT inhaler Inhale 2 puffs into the lungs 2 (two) times daily. 05/25/17   Saguier, Percell Miller, PA-C  gabapentin (NEURONTIN) 300 MG capsule TAKE 1 CAPSULE BY MOUTH 3 TIMES DAILY 12/28/17   [name], MD  metoCLOPramide (REGLAN) 5 MG tablet TAKE 1 TABLET (5 MG) BY MOUTH AT BEDTIME 04/06/17   Gatha Mayer, MD  metoprolol succinate (TOPROL-XL) 25 MG 24 hr  tablet Take 1 tablet (25 mg total) by mouth daily. 02/24/18   Deno Etienne, DO  minoxidil (LONITEN) 10 MG tablet Take 10 mg by mouth daily. 03/23/17   Supple, Megan E, RPH  ondansetron (ZOFRAN ODT) 4 MG disintegrating tablet Take 1 tablet (4 mg total) by mouth every 8 (eight) hours as needed for nausea or vomiting. 02/23/18   Saguier, Percell Miller, PA-C  oseltamivir (TAMIFLU) 75 MG capsule Take 1 capsule (75 mg total) by mouth 2 (two) times daily. 02/23/18   Saguier, Percell Miller, PA-C  pantoprazole (PROTONIX) 40 MG tablet Take 1 tablet (40 mg total) by mouth daily before breakfast. 03/02/17   Carlean Purl,  Ofilia Neas, MD  polyethylene glycol (MIRALAX / GLYCOLAX) packet Take 17 g by mouth daily as needed for mild constipation. Mix in 8 oz liquid and drink    [provider]  polyvinyl alcohol (ARTIFICIAL TEARS) 1.4 % ophthalmic solution Place 1 drop into both eyes daily as needed for dry eyes.    [provider]  spironolactone (ALDACTONE) 50 MG tablet Take 0.5 tablets (25 mg total) by mouth daily. 02/24/17   Fay Records, MD  sulfamethoxazole-trimethoprim (BACTRIM DS,SEPTRA DS) 800-160 MG tablet Take 1 tablet by mouth 2 (two) times daily. 09/22/17   Saguier, Percell Miller, PA-C  traMADol (ULTRAM) 50 MG tablet TAKE 1 TABLET BY MOUTH EVERY 8 HOURS AS NEEDED 02/14/18   [name], MD  triamcinolone cream (KENALOG) 0.5 % Apply 1 application topically daily as needed (for skin irritation).  01/22/12   Janith Lima, MD  Vitamin D, Ergocalciferol, (DRISDOL) 50000 units CAPS capsule Take 1 capsule (50,000 Units total) by mouth every 7 (seven) days. Patient taking differently: Take 50,000 Units by mouth every Friday.  07/13/16   [name], MD    Family History Family History  Problem Relation Age of Onset  . Stroke Brother   . Stroke Sister   . Stroke Mother   . Hyperlipidemia Mother   . Hypertension Mother   . Lung disease Father   . Diabetes Son   . Gout Son   . Obesity Sister   . Alcohol abuse Brother     . Cancer Brother        lung, smoker    Social History Social History   Tobacco Use  . Smoking status: Former Smoker    Packs/day: 0.25    Years: 35.00    Pack years: 8.75    Types: Cigarettes    Last attempt to quit: 01/12/1978    Years since quitting: 40.1  . Smokeless tobacco: Current User    Types: Chew  Substance Use Topics  . Alcohol use: No    Comment: Prior alcoholic, sober since 5784  . Drug use: No     Allergies   Diovan [valsartan]; Hctz [hydrochlorothiazide]; Hydralazine; Metformin and related; Prednisone; Codeine; and Oxycodone-acetaminophen   Review of Systems Review of Systems  Constitutional: Positive for fever. Negative for chills.  HENT: Negative for congestion and facial swelling.   Eyes: Negative for discharge and visual disturbance.  Respiratory: Negative for shortness of breath.   Cardiovascular: Negative for chest pain and palpitations.  Gastrointestinal: Negative for abdominal pain, diarrhea and vomiting.  Musculoskeletal: Positive for myalgias. Negative for arthralgias.  Skin: Negative for color change and rash.  Neurological: Negative for tremors, syncope and headaches.  Psychiatric/Behavioral: Negative for confusion and dysphoric mood.     Physical Exam Updated Vital Signs BP (!) 156/73   Pulse 100   Temp 98.3 F (36.8 C) (Oral)   Resp 16   Ht 5\' 11"  (1.803 m)   Wt 73 kg   SpO2 97%   BMI 22.45 kg/m   Physical Exam Vitals signs and nursing note reviewed.  Constitutional:      Appearance: He is well-developed.  HENT:     Head: Normocephalic and atraumatic.     Comments: Full range of motion of the neck no noted rigidity or stiffness    Ears:     Comments: Swollen turbinates, posterior nasal drip,  left TM is normal, right TM obscured by wax.  Tenderness to percussion over the right maxillary sinus Eyes:  Pupils: Pupils are equal, round, and reactive to light.  Neck:     Musculoskeletal: Normal range of motion and neck  supple.     Vascular: No JVD.  Cardiovascular:     Rate and Rhythm: Tachycardia present. Rhythm irregularly irregular.     Heart sounds: No murmur. No friction rub. No gallop.   Pulmonary:     Effort: No respiratory distress.     Breath sounds: No wheezing.  Abdominal:     General: There is no distension.     Tenderness: There is no abdominal tenderness. There is no guarding or rebound.     Comments: Benign abdominal exam  Musculoskeletal: Normal range of motion.  Skin:    Coloration: Skin is not pale.     Findings: No rash.  Neurological:     Mental Status: He is alert and oriented to person, place, and time.  Psychiatric:        Behavior: Behavior normal.      ED Treatments / Results  Labs (all labs ordered are listed, but only abnormal results are displayed) Labs Reviewed  COMPREHENSIVE METABOLIC PANEL - Abnormal; Notable for the following components:      Result Value   Glucose, Bld 156 (*)    Creatinine, Ser 1.33 (*)    Calcium 8.7 (*)    Total Protein 6.4 (*)    Albumin 3.4 (*)    GFR calc non Af Amer 48 (*)    GFR calc Af Amer 56 (*)    All other components within normal limits  CBC WITH DIFFERENTIAL/PLATELET - Abnormal; Notable for the following components:   WBC 10.9 (*)    Hemoglobin 12.6 (*)    HCT 38.8 (*)    Neutro Abs 9.6 (*)    All other components within normal limits  CULTURE, BLOOD (ROUTINE X 2)  CULTURE, BLOOD (ROUTINE X 2)  LACTIC ACID, PLASMA  PROTIME-INR  LACTIC ACID, PLASMA  URINALYSIS, ROUTINE W REFLEX MICROSCOPIC  INFLUENZA PANEL BY PCR (TYPE A & B)    EKG EKG Interpretation  Date/Time:  Thursday February 24 2018 17:25:10 EST Ventricular Rate:  125 PR Interval:    QRS Duration: 105 QT Interval:  331 QTC Calculation: 478 R Axis:   -81 Text Interpretation:  Atrial fibrillation replaced nsr Incomplete RBBB and LAFB Anterior infarct, old Baseline wander in lead(s) V3 Otherwise no significant change Confirmed by Deno Etienne [phone]) on  02/24/2018 5:53:56 PM   Radiology Dg Chest 2 View  Result Date: 02/23/2018 CLINICAL DATA:  85 year old male with a history of fever EXAM: CHEST - 2 VIEW COMPARISON:  05/25/2017 FINDINGS: Cardiomediastinal silhouette unchanged in size and contour. No central vascular congestion.  No interlobular septal thickening. Vague opacity at the right lung base adjacent to the right heart border, appears within the lobe on the lateral view. No pneumothorax. No large pleural effusion. Degenerative changes of the spine. No displaced fracture. Surgical changes of right carotid endarterectomy and surgical changes of the cervical region. IMPRESSION: Evidence of lobar pneumonia of the right middle lobe. Electronically Signed   By: Corrie Mckusick D.O.   On: 02/23/2018 18:05    Procedures Procedures (including critical care time)  Medications Ordered in ED Medications  apixaban (ELIQUIS) tablet 5 mg (has no administration in time range)  sodium chloride 0.9 % bolus 1,000 mL (1,000 mLs Intravenous New Bag/Given 02/24/18 1803)  metoprolol tartrate (LOPRESSOR) injection 5 mg (5 mg Intravenous Given 02/24/18 1805)     Initial Impression /  Assessment and Plan / ED Course  I have reviewed the triage vital signs and the nursing notes.  Pertinent labs & imaging results that were available during my care of the patient were reviewed by me and considered in my medical decision making (see chart for details).     85 yo M with a chief complaint of fevers and chills.  Seen by his family doctor yesterday and diagnosed with pneumonia.  At that visit he had a normal influenza test.  He was called because his white blood cell count was significantly elevated and told to come immediately to the emergency department.  On arrival to the room the patient is noted to be in A. fib with RVR with rates going into the 160s.  We will give a bolus of fluids and a dose of metoprolol.  Patient asymptomatic with his A. Fib.  Labs returned  and his white blood cell count is significantly improved from yesterday.  He has a mild acute kidney injury.  Lactate is normal.  Since he is asymptomatic with A. fib I am unsure of when this started.  He was seen yesterday and had a documented normal heart rate.  He may be going in and out of A. fib with his recent illness.  The patient continues to be asymptomatic would like to go home.  I discussed his case with Dr. Marlou Porch, cardiology he did not feel that patients illness causing atrial fibrillation was an absolute indication for admission.  As the patient is feeling well and asymptomatic from his A. fib he recommended starting him on metoprolol.  Based on his factor for stroke he recommended starting him on Eliquis 5 mg twice a day.  He recommends stopping his aspirin and Plavix.  I have the patient follow-up in the A. fib clinic in the next 48 hours if possible.  We will have her return for any worsening symptoms.  CRITICAL CARE Performed by: Cecilio Asper   Total critical care time: 35 minutes  Critical care time was exclusive of separately billable procedures and treating other patients.  Critical care was necessary to treat or prevent imminent or life-threatening deterioration.  Critical care was time spent personally by me on the following activities: development of treatment plan with patient and/or surrogate as well as nursing, discussions with consultants, evaluation of patient's response to treatment, examination of patient, obtaining history from patient or surrogate, ordering and performing treatments and interventions, ordering and review of laboratory studies, ordering and review of radiographic studies, pulse oximetry and re-evaluation of patient's condition.    CHA2DS2/VAS Stroke Risk Points  Current as of just now     5 >= 2 Points: High Risk  1 - 1.99 Points: Medium Risk  0 Points: Low Risk    The patient's score has not changed in the past year.:  No Change      Details    This score determines the patient's risk of having a stroke if the  patient has atrial fibrillation.       Points Metrics  0 Has Congestive Heart Failure:  No    Current as of just now  0 Has Vascular Disease:  No    Current as of just now  1 Has Hypertension:  Yes    Current as of just now  2 Age:  89    Current as of just now  0 Has Diabetes:  No    Current as of just now  2 Had Stroke:  Yes  Had TIA:  No  Had thromboembolism:  No    Current as of just now  0 Male:  No    Current as of just now          7:34 PM:  I have discussed the diagnosis/risks/treatment options with the patient and family and believe the pt to be eligible for discharge home to follow-up with PCP, Cards. We also discussed returning to the ED immediately if new or worsening sx occur. We discussed the sx which are most concerning (e.g., sudden worsening pain, fever, inability to tolerate by mouth) that necessitate immediate return. Medications administered to the patient during their visit and any new prescriptions provided to the patient are listed below.  Medications given during this visit Medications  apixaban (ELIQUIS) tablet 5 mg (has no administration in time range)  sodium chloride 0.9 % bolus 1,000 mL (1,000 mLs Intravenous New Bag/Given 02/24/18 1803)  metoprolol tartrate (LOPRESSOR) injection 5 mg (5 mg Intravenous Given 02/24/18 1805)     The patient appears reasonably screen and/or stabilized for discharge and I doubt any other medical condition or other [facility] requiring further screening, evaluation, or treatment in the ED at this time prior to discharge.   Final Clinical Impressions(s) / ED Diagnoses   Final diagnoses:  Community acquired pneumonia of right middle lobe of lung ([place])  Atrial fibrillation with RVR [facility])    ED Discharge Orders         Ordered    apixaban (ELIQUIS) 5 MG TABS tablet  2 times daily     02/24/18 1927    metoprolol succinate (TOPROL-XL) 25 MG 24 hr  tablet  Daily     02/24/18 1927           Deno Etienne, DO 02/24/18 1934

## 2018-02-24 NOTE — Discharge Instructions (Signed)
Follow up with the Afib clinic.  Call them tomorrow.   Return for worsening trouble breathing, weakness, confusion.    Stop your aspirin and plavix while taking the eliquis

## 2018-02-24 NOTE — Telephone Encounter (Signed)
Called pt otified him WBC is high and he needs to go to ER. Pt is agreeable to go to ER.

## 2018-02-24 NOTE — Telephone Encounter (Signed)
Pt.'s wife and daughter, Darrick Grinder, calling to ask why pt. Needs to go to ED. They understand that his WBC' s are elevated, but want to know what needs to be done in the ED "Since he is already on an antibiotic." Spoke with Mel Almond at the practice and she will talk to provider. Wife request the office obtain verbal permission from pt., To talk to his daughter - who is a Marine scientist.

## 2018-02-24 NOTE — Telephone Encounter (Signed)
"  CRITICAL VALUE STICKER  CRITICAL VALUE:WBC 21.2  RECEIVER (on-site recipient of call):Karsten Howry  DATE & TIME NOTIFIED: 02-24-18 1140  MESSENGER (representative from lab):Karen  MD NOTIFIED: Saguier  TIME OF NOTIFICATION:1145  RESPONSE:

## 2018-02-24 NOTE — Telephone Encounter (Signed)
[name] [name] called- needing to know why Pt needs to go to ED. Informed that his WBC is elevated at 21.2. She is currently not home but will take him to ED at either [facility] or [facility] when she does get home.

## 2018-02-24 NOTE — Telephone Encounter (Signed)
Myself and various other staff member advised go to ED now.

## 2018-02-24 NOTE — Telephone Encounter (Signed)
I called pt at around noon and spoke with him to check that he went to ED as [facility] called and advised him. Explained with pneumonia and high white count as he has that he needs to go to ED immediatley. Explained importance to him and that discussed with Dr. Charlett Blake who agreed as well. He agreed he would go.

## 2018-02-24 NOTE — ED Notes (Signed)
Pt alert and oriented in NAD. Pt verbalized understanding of discharge instructions. 

## 2018-02-24 NOTE — ED Triage Notes (Signed)
Pt sent here by pcp for pneumonia, pt has experienced n/v x 3 days. Denies cough or shob. Afebrile.

## 2018-02-25 ENCOUNTER — Encounter (HOSPITAL_COMMUNITY): Payer: Self-pay | Admitting: Physician Assistant

## 2018-02-25 ENCOUNTER — Ambulatory Visit (HOSPITAL_COMMUNITY)
Admission: RE | Admit: 2018-02-25 | Discharge: 2018-02-25 | Disposition: A | Discharge status: Home / Self Care | Payer: PPO | Source: Ambulatory Visit | Attending: Physician Assistant | Admitting: Physician Assistant

## 2018-02-25 VITALS — BP 178/54 | HR 76 | Ht 71.0 in | Wt 162.0 lb

## 2018-02-25 DIAGNOSIS — I10 Essential (primary) hypertension: Secondary | ICD-10-CM | POA: Diagnosis not present

## 2018-02-25 DIAGNOSIS — K219 Gastro-esophageal reflux disease without esophagitis: Secondary | ICD-10-CM | POA: Diagnosis not present

## 2018-02-25 DIAGNOSIS — I639 Cerebral infarction, unspecified: Secondary | ICD-10-CM | POA: Diagnosis not present

## 2018-02-25 DIAGNOSIS — J45909 Unspecified asthma, uncomplicated: Secondary | ICD-10-CM | POA: Insufficient documentation

## 2018-02-25 DIAGNOSIS — Z8249 Family history of ischemic heart disease and other diseases of the circulatory system: Secondary | ICD-10-CM | POA: Diagnosis not present

## 2018-02-25 DIAGNOSIS — I480 Paroxysmal atrial fibrillation: Secondary | ICD-10-CM | POA: Diagnosis not present

## 2018-02-25 DIAGNOSIS — I4891 Unspecified atrial fibrillation: Secondary | ICD-10-CM | POA: Diagnosis not present

## 2018-02-25 DIAGNOSIS — G894 Chronic pain syndrome: Secondary | ICD-10-CM | POA: Diagnosis not present

## 2018-02-25 DIAGNOSIS — Z7901 Long term (current) use of anticoagulants: Secondary | ICD-10-CM | POA: Diagnosis not present

## 2018-02-25 DIAGNOSIS — D649 Anemia, unspecified: Secondary | ICD-10-CM | POA: Diagnosis not present

## 2018-02-25 DIAGNOSIS — E785 Hyperlipidemia, unspecified: Secondary | ICD-10-CM | POA: Diagnosis not present

## 2018-02-25 DIAGNOSIS — J189 Pneumonia, unspecified organism: Secondary | ICD-10-CM | POA: Insufficient documentation

## 2018-02-25 DIAGNOSIS — Z87891 Personal history of nicotine dependence: Secondary | ICD-10-CM | POA: Insufficient documentation

## 2018-02-25 DIAGNOSIS — Z79899 Other long term (current) drug therapy: Secondary | ICD-10-CM | POA: Insufficient documentation

## 2018-02-25 MED FILL — ELIQUIS 5 MG TABLET: 5 | 30 days supply | Qty: 60 | Fill #0

## 2018-02-25 MED FILL — METOPROLOL SUCCINATE ER 25: 25 | 30 days supply | Qty: 30 | Fill #0

## 2018-02-25 NOTE — Telephone Encounter (Signed)
Thank you :)

## 2018-02-25 NOTE — Progress Notes (Signed)
Primary Care Physician: [name], MD Primary Cardiologist: Dr Harrington Challenger Referring Physician: [facility]   [name] is a 85 y.o. male with a history of HTN, HLD, CVA, carotid disease (s/p CEA 2012), and new onset paroxysmal atrial fibrillation who presents for consultation in the [facility]. The patient was initially diagnosed with atrial fibrillation in the ER 02/24/18 in the setting of pneumonia after presenting with symptoms of fever and chills. ECG at the ER showed afib with RVR HR 125. He was asymptomatic. Of note, his HR was in the normal range at his PCP office the day prior. He was started on metoprolol and Eliquis, his ASA and Plavix were stopped. He reports today that he is feeling better overall. He denies ever having heart racing or palpitations in the past.   Today, he denies symptoms of palpitations, chest pain, shortness of breath, orthopnea, PND, lower extremity edema, dizziness, presyncope, syncope, snoring, daytime somnolence, bleeding, or neurologic sequela. +fever, fatigue The patient is tolerating medications without difficulties and is otherwise without complaint today.    Atrial Fibrillation Risk Factors:  he does not have symptoms or diagnosis of sleep apnea. The patient does not have a history of early familial atrial fibrillation or other arrhythmias.  he has a BMI of Body mass index is 22.59 kg/m.[name] Kitchen Filed Weights   02/25/18 1349  Weight: 73.5 kg    Family History  Problem Relation Age of Onset  . Stroke Brother   . Stroke Sister   . Stroke Mother   . Hyperlipidemia Mother   . Hypertension Mother   . Lung disease Father   . Diabetes Son   . Gout Son   . Obesity Sister   . Alcohol abuse Brother   . Cancer Brother        lung, smoker     Atrial Fibrillation Management history:  Previous antiarrhythmic drugs: none Previous cardioversions: none Previous ablations: none CHADS2VASC score: 57 (age, CVA, HTN,  vascular) Anticoagulation history: Eliquis    Past Medical History:  Diagnosis Date  . Acute bronchitis 06/10/2015  . Allergic rhinitis   . Anemia   . Asthma   . Carotid stenosis    a. s/p Left CEA 2002;  b. Carotid US 3/16:  patent R CEA, L < 40%  . Chronic pain syndrome    Left shoulder, back  . Diverticulosis   . Dyslipidemia   . Epigastric pain 04/05/2016  . Esophageal stricture    Distal, benign  . GERD (gastroesophageal reflux disease)   . Hemorrhoids   . HTN (hypertension)    Negative renal duplex 07-22-11  . Hx of echocardiogram    a. Echo 10/11: mod LVH, EF 55-60%  . Pneumonia 07-2013  . Prostate cancer ([place]) 2000   Seed XRT  . Spondylosis, cervical, with myelopathy 11/26/2015  . Stroke [facility]) 8/01   right thalamic - on chronic Plavix/ASA   Past Surgical History:  Procedure Laterality Date  . APPENDECTOMY  1953  . CAROTID ENDARTERECTOMY Right 01/2000  . CATARACT EXTRACTION Bilateral   . CERVICAL DISC SURGERY  8/07  . INSERTION PROSTATE RADIATION SEED  2000  . [place] SURGERY  1990s  . NASAL SINUS SURGERY     x 3    Current Outpatient Medications  Medication Sig Dispense Refill  . albuterol (PROVENTIL HFA;VENTOLIN HFA) 108 (90 Base) MCG/ACT inhaler Inhale 1-2 puffs into the lungs every 6 (six) hours as needed for wheezing or shortness of  breath. 1 Inhaler 6  . amLODipine (NORVASC) 5 MG tablet TAKE 1 TABLET BY MOUTH TWICE A DAY 180 tablet 3  . atorvastatin (LIPITOR) 40 MG tablet TAKE 1 TABLET BY MOUTH DAILY. 90 tablet 1  . benzonatate (TESSALON) 100 MG capsule Take 1 capsule (100 mg total) by mouth 3 (three) times daily as needed for cough. 30 capsule 0  . cholecalciferol (VITAMIN D) 1000 UNITS tablet Take 1,000 Units by mouth daily.    . cloNIDine (CATAPRES) 0.1 MG tablet Take 1 tablet by mouth 3 to 4 times by mouth each day. 120 tablet 11  . docusate sodium (COLACE) 100 MG capsule Take 100 mg by mouth 2 (two) times daily.     [name] Kitchen doxycycline (VIBRA-TABS) 100  MG tablet Take 1 tablet (100 mg total) by mouth 2 (two) times daily. Can give caps or generic 20 tablet 0  . fluticasone (FLONASE) 50 MCG/ACT nasal spray PLACE 2 SPRAYS INTO BOTH NOSTRILS DAILY. (Patient taking differently: PLACE 2 SPRAYS INTO BOTH NOSTRILS DAILY AS NEEDED FOR CONGESTION) 16 g 6  . gabapentin (NEURONTIN) 300 MG capsule TAKE 1 CAPSULE BY MOUTH 3 TIMES DAILY 270 capsule 1  . metoCLOPramide (REGLAN) 5 MG tablet TAKE 1 TABLET (5 MG) BY MOUTH AT BEDTIME 30 tablet 11  . metoprolol succinate (TOPROL-XL) 25 MG 24 hr tablet Take 1 tablet (25 mg total) by mouth daily. 30 tablet 0  . minoxidil (LONITEN) 10 MG tablet Take 10 mg by mouth daily. 30 tablet 11  . ondansetron (ZOFRAN ODT) 4 MG disintegrating tablet Take 1 tablet (4 mg total) by mouth every 8 (eight) hours as needed for nausea or vomiting. 20 tablet 0  . pantoprazole (PROTONIX) 40 MG tablet Take 1 tablet (40 mg total) by mouth daily before breakfast. 90 tablet 3  . polyethylene glycol (MIRALAX / GLYCOLAX) packet Take 17 g by mouth daily as needed for mild constipation. Mix in 8 oz liquid and drink    . polyvinyl alcohol (ARTIFICIAL TEARS) 1.4 % ophthalmic solution Place 1 drop into both eyes daily as needed for dry eyes.    [name] Kitchen spironolactone (ALDACTONE) 50 MG tablet Take 50 mg by mouth daily.  30 tablet 11  . traMADol (ULTRAM) 50 MG tablet TAKE 1 TABLET BY MOUTH EVERY 8 HOURS AS NEEDED 60 tablet 0  . triamcinolone cream (KENALOG) 0.5 % Apply 1 application topically daily as needed (for skin irritation).     [name] Kitchen apixaban (ELIQUIS) 5 MG TABS tablet Take 1 tablet (5 mg total) by mouth 2 (two) times daily for 30 days. (Patient not taking: Reported on 02/25/2018) 60 tablet 0  . fluticasone (FLOVENT HFA) 110 MCG/ACT inhaler Inhale 2 puffs into the lungs 2 (two) times daily. (Patient not taking: Reported on 02/25/2018) 1 Inhaler 0   No current facility-administered medications for this encounter.     Allergies  Allergen Reactions  . Diovan  [Valsartan] Other (See Comments)    Elevated potassium hyperkalemia  . Hctz [Hydrochlorothiazide] Other (See Comments)    Swelling and dyspnea   . Hydralazine     Chest pain, GI issues, shortness of breath  . Metformin And Related Other (See Comments)    Unknown reaction  . Prednisone     Suicidal thoughts  . Codeine Nausea Only  . Oxycodone-Acetaminophen Nausea Only    Social History   Socioeconomic History  . Marital status: Married    Spouse name: Not on file  . Number of children: 1  . Years of  education: 10  . Highest education level: Not on file  Occupational History  . Occupation: Retired  Scientific laboratory technician  . Financial resource strain: Not on file  . Food insecurity:    Worry: Not on file    Inability: Not on file  . Transportation needs:    Medical: Not on file    Non-medical: Not on file  Tobacco Use  . Smoking status: Former Smoker    Packs/day: 0.25    Years: 35.00    Pack years: 8.75    Types: Cigarettes    Last attempt to quit: 01/12/1978    Years since quitting: 40.1  . Smokeless tobacco: Current User    Types: Chew  Substance and Sexual Activity  . Alcohol use: No    Comment: Prior alcoholic, sober since 6629  . Drug use: No  . Sexual activity: Not Currently  Lifestyle  . Physical activity:    Days per week: Not on file    Minutes per session: Not on file  . Stress: Not on file  Relationships  . Social connections:    Talks on phone: Not on file    Gets together: Not on file    Attends religious service: Not on file    Active member of club or organization: Not on file    Attends meetings of clubs or organizations: Not on file    Relationship status: Not on file  . Intimate partner violence:    Fear of current or ex partner: Not on file    Emotionally abused: Not on file    Physically abused: Not on file    Forced sexual activity: Not on file  Other Topics Concern  . Not on file  Social History Narrative   Lives at home w/ his wife    Right-handed    Caffeine: drinks a lot of coffee per report     ROS- All systems are reviewed and negative except as per the HPI above.  Physical Exam: Vitals:   02/25/18 1349  BP: (!) 178/54  Pulse: 76  Weight: 73.5 kg  Height: 5\' 11"  (1.803 m)    GEN- The patient is well appearing, alert and oriented x 3 today.   Head- normocephalic, atraumatic Eyes-  Sclera clear, conjunctiva pink Ears- hearing intact Oropharynx- clear Neck- supple  Lungs- mild crackles RLL, normal work of breathing Heart- Regular rate and rhythm, no murmurs, rubs or gallops  GI- soft, NT, ND, + BS Extremities- no clubbing, cyanosis, or edema MS- no significant deformity or atrophy Skin- no rash or lesion Psych- euthymic mood, full affect Neuro- strength and sensation are intact  Wt Readings from Last 3 Encounters:  02/25/18 73.5 kg  02/24/18 73 kg  02/23/18 73 kg    EKG today demonstrates SR HR 76, PACs, LAFB, inc RBBB, PR 176, QRS 92, QTc 483.  Epic records are reviewed at length today  Assessment and Plan:  1. Paroxysmal atrial fibrillation The patient has had paroxysmal atrial fibrillation in the setting of pneumonia. He was asymptomatic of his arrhythmia although he was feeling poorly anyway from his infection. Agree with starting Eliquis 5 mg BID with elevated CHADS2VASC score. Check echocardiogram Continue Toprol 25 mg daily Check Bmet/CBC in one month.  This patients CHA2DS2-VASc Score and unadjusted Ischemic Stroke Rate (% per year) is equal to 9.7 % stroke rate/year from a score of 6  Above score calculated as 1 point each if present [CHF, HTN, DM, Vascular=MI/PAD/Aortic Plaque, Age if 24-74, or  Male] Above score calculated as 2 points each if present [Age > 75, or Stroke/TIA/TE]   2. HTN Elevated today. On review, his BP has historically been difficult to manage. Agree with starting Toprol 25 mg daily. Check echo.  3. PNA Likely contributing to his arrhythmia. On  antibiotic. Plan per PCP.  4. Carotid disease S/p R CEA. Followed by Dr Donnetta Hutching.   Follow up in Afib clinic in 4 weeks.  [facility] [address] [place], [place] [address] [phone] 02/25/2018 2:26 PM

## 2018-02-28 ENCOUNTER — Ambulatory Visit (HOSPITAL_COMMUNITY): Payer: PPO | Admitting: Nurse Practitioner

## 2018-03-01 LAB — CULTURE, BLOOD (ROUTINE X 2)
Culture: NO GROWTH
Culture: NO GROWTH
Special Requests: ADEQUATE
Special Requests: ADEQUATE

## 2018-03-04 ENCOUNTER — Telehealth: Payer: Self-pay

## 2018-03-04 ENCOUNTER — Ambulatory Visit (INDEPENDENT_AMBULATORY_CARE_PROVIDER_SITE_OTHER): Payer: PPO | Admitting: Family Medicine

## 2018-03-04 ENCOUNTER — Telehealth: Payer: Self-pay | Admitting: Medical

## 2018-03-04 ENCOUNTER — Ambulatory Visit (HOSPITAL_BASED_OUTPATIENT_CLINIC_OR_DEPARTMENT_OTHER)
Admission: RE | Admit: 2018-03-04 | Discharge: 2018-03-04 | Disposition: A | Discharge status: Home / Self Care | Payer: PPO | Source: Ambulatory Visit | Attending: Medical | Admitting: Medical

## 2018-03-04 ENCOUNTER — Encounter: Payer: Self-pay | Admitting: Family Medicine

## 2018-03-04 ENCOUNTER — Other Ambulatory Visit: Payer: Self-pay | Admitting: Family Medicine

## 2018-03-04 VITALS — BP 162/70 | HR 67 | Temp 98.2°F | Ht 71.0 in | Wt 164.0 lb

## 2018-03-04 DIAGNOSIS — J189 Pneumonia, unspecified organism: Secondary | ICD-10-CM | POA: Insufficient documentation

## 2018-03-04 DIAGNOSIS — M549 Dorsalgia, unspecified: Secondary | ICD-10-CM

## 2018-03-04 DIAGNOSIS — M542 Cervicalgia: Secondary | ICD-10-CM | POA: Diagnosis not present

## 2018-03-04 MED ORDER — TRAMADOL HCL 50 MG PO TABS: 50.0000 mg | ORAL_TABLET | Freq: Three times a day (TID) | ORAL | 0 refills | Status: DC | PRN

## 2018-03-04 MED ORDER — METHYLPREDNISOLONE ACETATE 80 MG/ML IJ SUSP
80.0000 mg | Freq: Once | INTRAMUSCULAR | Status: AC
  Administered 2018-03-04: 80 mg via INTRAMUSCULAR

## 2018-03-04 MED FILL — traMADol HCL 50 MG TABS: 50 | 25 days supply | Qty: 75 | Fill #0

## 2018-03-04 NOTE — Telephone Encounter (Signed)
Repeat follow up xray for pneumonia.

## 2018-03-04 NOTE — Patient Instructions (Addendum)
Heat (pad or rice pillow in microwave) over affected area, 10-15 minutes twice daily.   OK to take Tylenol 1000 mg (2 extra strength tabs) or 975 mg (3 regular strength tabs) every 6 hours as needed.  If the pain is not improved or gets worse, or if you get new signs/symptoms (like fevers), go to the ER.   For the stretches/exercises, do what you can tolerate.  Someone will be in touch regarding your chest X-ray.   EXERCISES RANGE OF MOTION (ROM) AND STRETCHING EXERCISES  These exercises may help you when beginning to rehabilitate your issue. In order to successfully resolve your symptoms, you must improve your posture. These exercises are designed to help reduce the forward-head and rounded-shoulder posture which contributes to this condition. Your symptoms may resolve with or without further involvement from your physician, physical therapist or athletic trainer. While completing these exercises, remember:   Restoring tissue flexibility helps normal motion to return to the joints. This allows healthier, less painful movement and activity.  An effective stretch should be held for at least 20 seconds, although you may need to begin with shorter hold times for comfort.  A stretch should never be painful. You should only feel a gentle lengthening or release in the stretched tissue.  Do not do any stretch or exercise that you cannot tolerate.  STRETCH- Axial Extensors  Lie on your back on the floor. You may bend your knees for comfort. Place a rolled-up hand towel or dish towel, about 2 inches in diameter, under the part of your head that makes contact with the floor.  Gently tuck your chin, as if trying to make a "double chin," until you feel a gentle stretch at the base of your head.  Hold 15-20 seconds. Repeat 2-3 times. Complete this exercise 1 time per day.   STRETCH - Axial Extension   Stand or sit on a firm surface. Assume a good posture: chest up, shoulders drawn back, abdominal  muscles slightly tense, knees unlocked (if standing) and feet hip width apart.  Slowly retract your chin so your head slides back and your chin slightly lowers. Continue to look straight ahead.  You should feel a gentle stretch in the back of your head. Be certain not to feel an aggressive stretch since this can cause headaches later.  Hold for 15-20 seconds. Repeat 2-3 times. Complete this exercise 1 time per day.  STRETCH - Cervical Side Bend   Stand or sit on a firm surface. Assume a good posture: chest up, shoulders drawn back, abdominal muscles slightly tense, knees unlocked (if standing) and feet hip width apart.  Without letting your nose or shoulders move, slowly tip your right / left ear to your shoulder until your feel a gentle stretch in the muscles on the opposite side of your neck.  Hold 15-20 seconds. Repeat 2-3 times. Complete this exercise 1-2 times per day.  STRETCH - Cervical Rotators   Stand or sit on a firm surface. Assume a good posture: chest up, shoulders drawn back, abdominal muscles slightly tense, knees unlocked (if standing) and feet hip width apart.  Keeping your eyes level with the ground, slowly turn your head until you feel a gentle stretch along the back and opposite side of your neck.  Hold 15-20 seconds. Repeat 2-3 times. Complete this exercise 1-2 times per day.  RANGE OF MOTION - Neck Circles   Stand or sit on a firm surface. Assume a good posture: chest up, shoulders drawn back,  abdominal muscles slightly tense, knees unlocked (if standing) and feet hip width apart.  Gently roll your head down and around from the back of one shoulder to the back of the other. The motion should never be forced or painful.  Repeat the motion 10-20 times, or until you feel the neck muscles relax and loosen. Repeat 2-3 times. Complete the exercise 1-2 times per day. STRENGTHENING EXERCISES - Cervical Strain and Sprain These exercises may help you when beginning to  rehabilitate your injury. They may resolve your symptoms with or without further involvement from your physician, physical therapist, or athletic trainer. While completing these exercises, remember:   Muscles can gain both the endurance and the strength needed for everyday activities through controlled exercises.  Complete these exercises as instructed by your physician, physical therapist, or athletic trainer. Progress the resistance and repetitions only as guided.  You may experience muscle soreness or fatigue, but the pain or discomfort you are trying to eliminate should never worsen during these exercises. If this pain does worsen, stop and make certain you are following the directions exactly. If the pain is still present after adjustments, discontinue the exercise until you can discuss the trouble with your clinician.  STRENGTH - Cervical Flexors, Isometric  Face a wall, standing about 6 inches away. Place a small pillow, a ball about 6-8 inches in diameter, or a folded towel between your forehead and the wall.  Slightly tuck your chin and gently push your forehead into the soft object. Push only with mild to moderate intensity, building up tension gradually. Keep your jaw and forehead relaxed.  Hold 10 to 20 seconds. Keep your breathing relaxed.  Release the tension slowly. Relax your neck muscles completely before you start the next repetition. Repeat 2-3 times. Complete this exercise 1 time per day.  STRENGTH- Cervical Lateral Flexors, Isometric   Stand about 6 inches away from a wall. Place a small pillow, a ball about 6-8 inches in diameter, or a folded towel between the side of your head and the wall.  Slightly tuck your chin and gently tilt your head into the soft object. Push only with mild to moderate intensity, building up tension gradually. Keep your jaw and forehead relaxed.  Hold 10 to 20 seconds. Keep your breathing relaxed.  Release the tension slowly. Relax your neck  muscles completely before you start the next repetition. Repeat 2-3 times. Complete this exercise 1 time per day.  STRENGTH - Cervical Extensors, Isometric   Stand about 6 inches away from a wall. Place a small pillow, a ball about 6-8 inches in diameter, or a folded towel between the back of your head and the wall.  Slightly tuck your chin and gently tilt your head back into the soft object. Push only with mild to moderate intensity, building up tension gradually. Keep your jaw and forehead relaxed.  Hold 10 to 20 seconds. Keep your breathing relaxed.  Release the tension slowly. Relax your neck muscles completely before you start the next repetition. Repeat 2-3 times. Complete this exercise 1 time per day.  POSTURE AND BODY MECHANICS CONSIDERATIONS Keeping correct posture when sitting, standing or completing your activities will reduce the stress put on different body tissues, allowing injured tissues a chance to heal and limiting painful experiences. The following are general guidelines for improved posture. Your physician or physical therapist will provide you with any instructions specific to your needs. While reading these guidelines, remember:  The exercises prescribed by your provider will help  you have the flexibility and strength to maintain correct postures.  The correct posture provides the optimal environment for your joints to work. All of your joints have less wear and tear when properly supported by a spine with good posture. This means you will experience a healthier, less painful body.  Correct posture must be practiced with all of your activities, especially prolonged sitting and standing. Correct posture is as important when doing repetitive low-stress activities (typing) as it is when doing a single heavy-load activity (lifting).  PROLONGED STANDING WHILE SLIGHTLY LEANING FORWARD When completing a task that requires you to lean forward while standing in one place for a  long time, place either foot up on a stationary 2- to 4-inch high object to help maintain the best posture. When both feet are on the ground, the low back tends to lose its slight inward curve. If this curve flattens (or becomes too large), then the back and your other joints will experience too much stress, fatigue more quickly, and can cause pain.   RESTING POSITIONS Consider which positions are most painful for you when choosing a resting position. If you have pain with flexion-based activities (sitting, bending, stooping, squatting), choose a position that allows you to rest in a less flexed posture. You would want to avoid curling into a fetal position on your side. If your pain worsens with extension-based activities (prolonged standing, working overhead), avoid resting in an extended position such as sleeping on your stomach. Most people will find more comfort when they rest with their spine in a more neutral position, neither too rounded nor too arched. Lying on a non-sagging bed on your side with a pillow between your knees, or on your back with a pillow under your knees will often provide some relief. Keep in mind, being in any one position for a prolonged period of time, no matter how correct your posture, can still lead to stiffness.  WALKING Walk with an upright posture. Your ears, shoulders, and hips should all line up. OFFICE WORK When working at a desk, create an environment that supports good, upright posture. Without extra support, muscles fatigue and lead to excessive strain on joints and other tissues.  CHAIR:  A chair should be able to slide under your desk when your back makes contact with the back of the chair. This allows you to work closely.  The chair's height should allow your eyes to be level with the upper part of your monitor and your hands to be slightly lower than your elbows.  Body position: ? Your feet should make contact with the floor. If this is not possible, use  a foot rest. ? Keep your ears over your shoulders. This will reduce stress on your neck and low back.

## 2018-03-04 NOTE — Telephone Encounter (Signed)
Copied from [place] [id number]. Topic: Appointment Scheduling - Scheduling Inquiry for Clinic >> Mar 03, 2018  4:49 PM Bea Graff, NT wrote: Reason for CRM: Pts wife states that her husband is needing to be seen with Mackie Pai tomorrow for follow-up for pneumonia and AFIB that he had in the ER. Unable to schedule due to block on schedule. Please advise.

## 2018-03-04 NOTE — Progress Notes (Signed)
Musculoskeletal Exam  Patient: [name] DOB: 09/20/1929  DOS: 03/04/2018  SUBJECTIVE:  Chief Complaint:   Chief Complaint  Patient presents with  . Neck Pain    [name] is a 85 y.o.  male for evaluation and treatment of neck pain.    Onset:  3 days ago. No inj or change in activity.  Location: back of neck, b/l Character:  aching, shooting, stabbing and throbbing  Progression of issue:  has worsened Associated symptoms: Cannot bend neck Treatment: to date has been tramadol, heat.   Neurovascular symptoms: no  ROS: Musculoskeletal/Extremities: +neck pain  Past Medical History:  Diagnosis Date  . Acute bronchitis 06/10/2015  . Allergic rhinitis   . Anemia   . Asthma   . Carotid stenosis    a. s/p Left CEA 2002;  b. Carotid US 3/16:  patent R CEA, L < 40%  . Chronic pain syndrome    Left shoulder, back  . Diverticulosis   . Dyslipidemia   . Epigastric pain 04/05/2016  . Esophageal stricture    Distal, benign  . GERD (gastroesophageal reflux disease)   . Hemorrhoids   . HTN (hypertension)    Negative renal duplex 07-22-11  . Hx of echocardiogram    a. Echo 10/11: mod LVH, EF 55-60%  . Pneumonia 07-2013  . Prostate cancer ([place]) 2000   Seed XRT  . Spondylosis, cervical, with myelopathy 11/26/2015  . Stroke ([place]) 8/01   right thalamic - on chronic Plavix/ASA    Objective: VITAL SIGNS: BP (!) 162/70 (BP Location: Left Arm, Patient Position: Sitting, Cuff Size: Normal)   Pulse 67   Temp 98.2 F (36.8 C) (Oral)   Ht 5\' 11"  (1.803 m)   Wt 164 lb (74.4 kg)   SpO2 97%   BMI 22.87 kg/m  Constitutional: Well formed, well developed. No acute distress. Cardiovascular: RRR Thorax & Lungs: CTAB, No accessory muscle use Musculoskeletal: Neck.   Normal active range of motion: no.   Normal passive range of motion: no Tenderness to palpation: yes, over midline, but worse over the parasp msc Deformity: no Ecchymosis: no Tests positive:  Tests  negative: Neurologic: Normal sensory function. No focal deficits noted. DTR's equal and symmetry in UE's. No clonus. 5/5 strength throughout UE's. Unable to do Brudzinski's, equivocal Kernig's.  Psychiatric: Normal mood. Age appropriate judgment and insight. Alert & oriented x 3.    Assessment:  Neck pain - Plan: methylPREDNISolone acetate (DEPO-MEDROL) injection 80 mg  Back pain, unspecified back location, unspecified back pain laterality, unspecified chronicity - Plan: traMADol (ULTRAM) 50 MG tablet, methylPREDNISolone acetate (DEPO-MEDROL) injection 80 mg  Plan: Hx of neck pain, very severe for him. Steroid injection today. Tramadol refilled. Heat, stretches/exercises. Meningitis on ddx, low likelihood given lack of fever and general appearance of comfort. Wife and pt aware to go to ER if pain gets worse or if he starts developing fevers or other new s/s's.   PA who saw previously for PNA has ordered a f/u CXR and he is getting that today also.  F/u prn otherwise. The patient voiced understanding and agreement to the plan.   [place], DO 03/04/18  4:46 PM

## 2018-03-04 NOTE — Telephone Encounter (Signed)
Left pt a message to give [place] a call back to schedule hospital follow on Monday because there are no more opening today due to the office opening late.

## 2018-03-07 ENCOUNTER — Other Ambulatory Visit: Payer: Self-pay | Admitting: Internal Medicine

## 2018-03-07 MED FILL — AMLODIPINE BESYLATE 5 MG TA: 5 | 90 days supply | Qty: 180 | Fill #0

## 2018-03-07 MED FILL — METOCLOPRAMIDE 5 MG TABLET: 5 | 30 days supply | Qty: 30 | Fill #11

## 2018-03-08 ENCOUNTER — Telehealth: Payer: Self-pay | Admitting: Medical

## 2018-03-08 MED ORDER — AZITHROMYCIN 250 MG PO TABS: ORAL_TABLET | ORAL | 0 refills | Status: DC

## 2018-03-08 MED FILL — AZITHROMYCIN 250 MG TABLET: 250 | 5 days supply | Qty: 6 | Fill #0

## 2018-03-08 NOTE — Telephone Encounter (Signed)
Rx azithromycin sent to pt pharmacy.

## 2018-03-09 ENCOUNTER — Other Ambulatory Visit: Payer: Self-pay | Admitting: Internal Medicine

## 2018-03-09 MED FILL — CloNIDine HCL 0.1 MG TAB: 0.1 | 30 days supply | Qty: 120 | Fill #8

## 2018-03-10 MED FILL — MINOXIDIL 10 MG TABLET: 10 | 30 days supply | Qty: 30 | Fill #0

## 2018-03-15 ENCOUNTER — Ambulatory Visit (INDEPENDENT_AMBULATORY_CARE_PROVIDER_SITE_OTHER): Payer: PPO | Admitting: Physician Assistant

## 2018-03-15 ENCOUNTER — Encounter: Payer: Self-pay | Admitting: Physician Assistant

## 2018-03-15 VITALS — BP 172/68 | HR 59 | Ht 71.0 in | Wt 157.1 lb

## 2018-03-15 DIAGNOSIS — I10 Essential (primary) hypertension: Secondary | ICD-10-CM

## 2018-03-15 DIAGNOSIS — R001 Bradycardia, unspecified: Secondary | ICD-10-CM

## 2018-03-15 DIAGNOSIS — N179 Acute kidney failure, unspecified: Secondary | ICD-10-CM

## 2018-03-15 DIAGNOSIS — I4891 Unspecified atrial fibrillation: Secondary | ICD-10-CM

## 2018-03-15 DIAGNOSIS — R5383 Other fatigue: Secondary | ICD-10-CM | POA: Diagnosis not present

## 2018-03-15 DIAGNOSIS — I480 Paroxysmal atrial fibrillation: Secondary | ICD-10-CM

## 2018-03-15 DIAGNOSIS — E782 Mixed hyperlipidemia: Secondary | ICD-10-CM | POA: Diagnosis not present

## 2018-03-15 DIAGNOSIS — Z79899 Other long term (current) drug therapy: Secondary | ICD-10-CM

## 2018-03-15 MED ORDER — APIXABAN 5 MG PO TABS: 5.0000 mg | ORAL_TABLET | Freq: Two times a day (BID) | ORAL | 3 refills | Status: DC

## 2018-03-15 MED ORDER — METOPROLOL SUCCINATE ER 25 MG PO TB24: 12.5000 mg | ORAL_TABLET | Freq: Every day | ORAL | 3 refills | Status: DC

## 2018-03-15 NOTE — Patient Instructions (Signed)
Medication Instructions:  Your physician has recommended you make the following change in your medication:  1.  DECREASE the Metoprolol to 25 mg taking 1/2 tablet daily   If you need a refill on your cardiac medications before your next appointment, please call your pharmacy.   Lab work: TODAY:  BMET & CBC  If you have labs (blood work) drawn today and your tests are completely normal, you will receive your results only by: [name] Kitchen MyChart Message (if you have MyChart) OR . A paper copy in the mail If you have any lab test that is abnormal or we need to change your treatment, we will call you to review the results.  Testing/Procedures: None ordered  Follow-Up: Your physician recommends that you schedule a follow-up appointment in: 1-2 MONTHS WITH DR. ROSS   Any Other Special Instructions Will Be Listed Below (If Applicable).

## 2018-03-15 NOTE — Progress Notes (Signed)
Patient's BP used to be much better   Years ago it was difficult to control He did better on methyldopa but this has been difficult to get  Pharmacy said backorder He is on amlodipine 5   COuld increase to 5 bid Should have f/u in 4 wks

## 2018-03-15 NOTE — Progress Notes (Signed)
Cardiology Office Note    Date:  03/15/2018   ID:  [name], DOB 1929/10/01, MRN [id number]  PCP:  [name], MD  Cardiologist:  Dr. Harrington Challenger  Chief Complaint: 12  Months follow up  History of Present Illness:   [name] is a 85 y.o. male  with a history of HTN, HLD, piror CVA, carotid disease (s/p CEA 2012), and recent dx of paroxysmal atrial fibrillation presents for follow up.  The patient was initially diagnosed with atrial fibrillation in the ER 02/24/18 in the setting of pneumonia after presenting with symptoms of fever and chills. ECG at the ER showed afib with RVR HR 125. He was asymptomatic. Of note, his HR was in the normal range at his PCP office the day prior. He was started on metoprolol and Eliquis, his ASA and Plavix were stopped.   Seen in afib clinic 02/25/2018. Continued Eliquis and Toprol XL. Pending echo.  Here today for follow up with wife.  Patient does not like Eliquis and Toprol made him feel.  Patient has no energy.  He had no issue with activity and energy prior to A. fib/pneumonia.  He denies chest pain, shortness of breath, palpitation, orthopnea, PND, syncope, lower extremity edema or melena.  Compliant with his medication.  Intermittent dizziness.   Past Medical History:  Diagnosis Date  . Acute bronchitis 06/10/2015  . Allergic rhinitis   . Anemia   . Asthma   . Carotid stenosis    a. s/p Left CEA 2002;  b. Carotid US 3/16:  patent R CEA, L < 40%  . Chronic pain syndrome    Left shoulder, back  . Diverticulosis   . Dyslipidemia   . Epigastric pain 04/05/2016  . Esophageal stricture    Distal, benign  . GERD (gastroesophageal reflux disease)   . Hemorrhoids   . HTN (hypertension)    Negative renal duplex 07-22-11  . Hx of echocardiogram    a. Echo 10/11: mod LVH, EF 55-60%  . Pneumonia 07-2013  . Prostate cancer ([place]) 2000   Seed XRT  . Spondylosis, cervical, with myelopathy 11/26/2015  . 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 APPENDECTOMY  1953  . CAROTID ENDARTERECTOMY Right 01/2000  . CATARACT EXTRACTION Bilateral   . CERVICAL DISC SURGERY  8/07  . INSERTION PROSTATE RADIATION SEED  2000  . [place] SURGERY  1990s  . NASAL SINUS SURGERY     x 3    Current Medications: Prior to Admission medications   Medication Sig Start Date End Date Taking? Authorizing Provider  albuterol (PROVENTIL HFA;VENTOLIN HFA) 108 (90 Base) MCG/ACT inhaler Inhale 1-2 puffs into the lungs every 6 (six) hours as needed for wheezing or shortness of breath. 03/02/15   Melony Overly, MD  amLODipine (NORVASC) 5 MG tablet Take 1 tablet (5 mg total) by mouth 2 (two) times daily. Pt needs to make appt with Dr. Harrington Challenger for future refills - 1st attempt. Thank you 03/07/18   Fay Records, MD  apixaban (ELIQUIS) 5 MG TABS tablet Take 1 tablet (5 mg total) by mouth 2 (two) times daily for 30 days. 02/24/18 03/26/18  Deno Etienne, DO  atorvastatin (LIPITOR) 40 MG tablet TAKE 1 TABLET BY MOUTH DAILY. 01/25/18   [name], MD  azithromycin (ZITHROMAX) 250 MG tablet Take 2 tablets by mouth on day 1, followed by 1  tablet by mouth daily for 4 days. 03/08/18   Saguier, Percell Miller, PA-C  benzonatate (TESSALON) 100 MG capsule Take 1 capsule (100 mg total) by mouth 3 (three) times daily as needed for cough. 05/25/17   Saguier, Percell Miller, PA-C  cholecalciferol (VITAMIN D) 1000 UNITS tablet Take 1,000 Units by mouth daily.    [provider]  cloNIDine (CATAPRES) 0.1 MG tablet Take 1 tablet by mouth 3 to 4 times by mouth each day. 04/06/17   Fay Records, MD  docusate sodium (COLACE) 100 MG capsule Take 100 mg by mouth 2 (two) times daily.     [provider]  fluticasone (FLONASE) 50 MCG/ACT nasal spray PLACE 2 SPRAYS INTO BOTH NOSTRILS DAILY. Patient not taking: Reported on 03/04/2018 07/09/15   Hoyt Koch, MD  fluticasone [facility] HFA) 110 MCG/ACT inhaler Inhale 2  puffs into the lungs 2 (two) times daily. Patient not taking: Reported on 02/25/2018 05/25/17   Saguier, Percell Miller, PA-C  gabapentin (NEURONTIN) 300 MG capsule TAKE 1 CAPSULE BY MOUTH 3 TIMES DAILY 12/28/17   [name], MD  metoCLOPramide (REGLAN) 5 MG tablet TAKE 1 TABLET (5 MG) BY MOUTH AT BEDTIME 04/06/17   Gatha Mayer, MD  metoprolol succinate (TOPROL-XL) 25 MG 24 hr tablet Take 1 tablet (25 mg total) by mouth daily. 02/24/18   Deno Etienne, DO  minoxidil (LONITEN) 10 MG tablet Take 1/2 to 1 tablet by mouth daily. Please make yearly appt with Dr. Harrington Challenger for March before anymore refills. 1st attempt 03/10/18   Fay Records, MD  ondansetron (ZOFRAN ODT) 4 MG disintegrating tablet Take 1 tablet (4 mg total) by mouth every 8 (eight) hours as needed for nausea or vomiting. 02/23/18   Saguier, Percell Miller, PA-C  pantoprazole (PROTONIX) 40 MG tablet Take 1 tablet (40 mg total) by mouth daily before breakfast. 03/02/17   Gatha Mayer, MD  polyethylene glycol (MIRALAX / Floria Raveling) packet Take 17 g by mouth daily as needed for mild constipation. Mix in 8 oz liquid and drink    [provider]  polyvinyl alcohol (ARTIFICIAL TEARS) 1.4 % ophthalmic solution Place 1 drop into both eyes daily as needed for dry eyes.    [provider]  spironolactone (ALDACTONE) 50 MG tablet Take 50 mg by mouth daily.  02/24/17   Fay Records, MD  traMADol (ULTRAM) 50 MG tablet Take 1 tablet (50 mg total) by mouth every 8 (eight) hours as needed. 03/04/18   Shelda Pal, DO  triamcinolone cream (KENALOG) 0.5 % Apply 1 application topically daily as needed (for skin irritation).  01/22/12   Janith Lima, MD    Allergies:   Diovan [valsartan]; Hctz [hydrochlorothiazide]; Hydralazine; Metformin and related; Prednisone; Codeine; and Oxycodone-acetaminophen   Social History   Socioeconomic History  . Marital status: Married    Spouse name: Not on file  . Number of children: 1  . Years of education: 18   . Highest education level: Not on file  Occupational History  . Occupation: Retired  Scientific laboratory technician  . Financial resource strain: Not on file  . Food insecurity:    Worry: Not on file    Inability: Not on file  . Transportation needs:    Medical: Not on file    Non-medical: Not on file  Tobacco Use  . Smoking status: Former Smoker    Packs/day: 0.25    Years: 35.00    Pack years: 8.75    Types: Cigarettes  Last attempt to quit: 01/12/1978    Years since quitting: 40.1  . Smokeless tobacco: Current User    Types: Chew  Substance and Sexual Activity  . Alcohol use: No    Comment: Prior alcoholic, sober since 3016  . Drug use: No  . Sexual activity: Not Currently  Lifestyle  . Physical activity:    Days per week: Not on file    Minutes per session: Not on file  . Stress: Not on file  Relationships  . Social connections:    Talks on phone: Not on file    Gets together: Not on file    Attends religious service: Not on file    Active member of club or organization: Not on file    Attends meetings of clubs or organizations: Not on file    Relationship status: Not on file  Other Topics Concern  . Not on file  Social History Narrative   Lives at home w/ his wife   Right-handed    Caffeine: drinks a lot of coffee per report     Family History:  The patient's family history includes Alcohol abuse in his brother; Cancer in his brother; Diabetes in his son; Gout in his son; Hyperlipidemia in his mother; Hypertension in his mother; Lung disease in his father; Obesity in his sister; Stroke in his brother, mother, and sister.   ROS:   Please see the history of present illness.    ROS All other systems reviewed and are negative.   PHYSICAL EXAM:   VS:  BP (!) 172/68   Pulse (!) 59   Ht 5\' 11"  (1.803 m)   Wt 157 lb 1.9 oz (71.3 kg)   SpO2 98%   BMI 21.91 kg/m    GEN: Well nourished, well developed, in no acute distress  HEENT: normal  Neck: no JVD, carotid bruits, or  masses Cardiac:RRR; no murmurs, rubs, or gallops,no edema  Respiratory:  clear to auscultation bilaterally, normal work of breathing GI: soft, nontender, nondistended, + BS MS: no deformity or atrophy  Skin: warm and dry, no rash Neuro:  Alert and Oriented x 3, Strength and sensation are intact Psych: euthymic mood, full affect  Wt Readings from Last 3 Encounters:  03/15/18 157 lb 1.9 oz (71.3 kg)  03/04/18 164 lb (74.4 kg)  02/25/18 162 lb (73.5 kg)      Studies/Labs Reviewed:   EKG:  EKG is ordered today.  The ekg ordered today demonstrates sinus rhythm at rate of 55 bpm  Recent Labs: 05/25/2017: Pro B Natriuretic peptide (BNP) 148.0 01/13/2018: TSH 1.94 02/24/2018: ALT 11; BUN 23; Creatinine, Ser 1.33; Hemoglobin 12.6; Platelets 237; Potassium 3.7; Sodium 138   Lipid Panel    Component Value Date/Time   CHOL 114 01/13/2018 1410   TRIG 85.0 01/13/2018 1410   HDL 46.50 01/13/2018 1410   CHOLHDL 2 01/13/2018 1410   VLDL 17.0 01/13/2018 1410   LDLCALC 50 01/13/2018 1410    Additional studies/ records that were reviewed today include:   Echocardiogram: pending    ASSESSMENT & PLAN:    1. PAF in setting of pneumonia 02/2018/fatigue/bradycardia/fatigue -Maintaining sinus rhythm however slow rate.  Suspect his fatigue is likely due to bradycardia or intermittent atrial fibrillation.  He has no energy.  He used to be very active prior to recent admission.  Denies any bleeding issue.  Will check BMET and CBC.  Reduce Toprol-XL to 12.5 mg daily.  He will keep a log of his heart rate  and blood pressure along with symptoms and give [place] a call on Monday.  At that time based on finding, might need of monitor or further adjustment of metoprolol.  Pending echocardiogram. - CHADSVASCs score of 6. Continue Eliquis.   2. HTN -Elevated.  Patient states his blood pressure always lruns 170s.  Will call us with reading on Monday.  3. Carotid artery disease - followed by Dr. Donnetta Hutching  4.  HLD -Continue statin.  5. AKi - Scr of 1.33 2 weeks ago. Check BMET.    Medication Adjustments/Labs and Tests Ordered: Current medicines are reviewed at length with the patient today.  Concerns regarding medicines are outlined above.  Medication changes, Labs and Tests ordered today are listed in the Patient Instructions below. Patient Instructions  Medication Instructions:  Your physician has recommended you make the following change in your medication:  1.  DECREASE the Metoprolol to 25 mg taking 1/2 tablet daily   If you need a refill on your cardiac medications before your next appointment, please call your pharmacy.   Lab work: TODAY:  BMET & CBC  If you have labs (blood work) drawn today and your tests are completely normal, you will receive your results only by: [name] Kitchen MyChart Message (if you have MyChart) OR . A paper copy in the mail If you have any lab test that is abnormal or we need to change your treatment, we will call you to review the results.  Testing/Procedures: None ordered  Follow-Up: Your physician recommends that you schedule a follow-up appointment in: 1-2 MONTHS WITH DR. ROSS   Any Other Special Instructions Will Be Listed Below (If Applicable).       Jarrett Soho, [place]  03/15/2018 2:51 PM    [facility], [place], [place]  [address] Phone: [phone]; Fax: [phone]

## 2018-03-16 LAB — CBC
HEMOGLOBIN: 11.3 g/dL — AB (ref 13.0–17.7)
Hematocrit: 32.2 % — ABNORMAL LOW (ref 37.5–51.0)
MCH: 30 pg (ref 26.6–33.0)
MCHC: 35.1 g/dL (ref 31.5–35.7)
MCV: 85 fL (ref 79–97)
Platelets: 375 10*3/uL (ref 150–450)
RBC: 3.77 x10E6/uL — ABNORMAL LOW (ref 4.14–5.80)
RDW: 13.7 % (ref 11.6–15.4)
WBC: 6.6 10*3/uL (ref 3.4–10.8)

## 2018-03-16 LAB — BASIC METABOLIC PANEL
BUN/Creatinine Ratio: 20 (ref 10–24)
BUN: 20 mg/dL (ref 8–27)
CO2: 23 mmol/L (ref 20–29)
CREATININE: 1.01 mg/dL (ref 0.76–1.27)
Calcium: 8.8 mg/dL (ref 8.6–10.2)
Chloride: 105 mmol/L (ref 96–106)
GFR calc Af Amer: 77 mL/min/{1.73_m2} (ref >59)
GFR calc non Af Amer: 67 mL/min/{1.73_m2} (ref >59)
Glucose: 89 mg/dL (ref 65–99)
Potassium: 5.1 mmol/L (ref 3.5–5.2)
Sodium: 140 mmol/L (ref 134–144)

## 2018-03-17 DIAGNOSIS — K625 Hemorrhage of anus and rectum: Secondary | ICD-10-CM

## 2018-03-21 ENCOUNTER — Telehealth: Payer: Self-pay | Admitting: Internal Medicine

## 2018-03-21 NOTE — Telephone Encounter (Signed)
Called and spoke to [place] at [facility] and methyldopa is not available at this time.  Called to follow up with the patient. Spoke with patient's wife (DPR on file) with patient in the background. Patient has been feeling tired and fatigued lately and his skin has been itching since he started eliquis 5 mg BID and Toprol 12.5 mg QD. Patient denies additional Sx at this time. Patient's HR has been 50-60s. BP was 219/76 and 174/69 after meds. Patient also takes amlodipine 5 mg BID, clonidine 0.1 mg TID, and minoxidil 10 mg QD. Patient has not been taking his spironolactone 50 mg QD.   Reviewed with Georgina Peer, PharmD who states that the patient should restart spironolactone 50 mg QD. Patient will need a BMET in 1 week. Patient has f/u appointment in the [facility] on 3/16. Patient will need BMET drawn at that time. Patient will need f/u appointment scheduled in HTN clinic 2 weeks after Afib Clinic appt. Wife verbalized understanding to all instruction. She does not want to schedule appt in HTN Clinic at this time. She states that she will wait for appt w/ Dr. Harrington Challenger on 4/13. Instructed for patient to continue to monitor BP and let us know if his Sx change or worsen. She verbalized understanding and thanked me for the call.

## 2018-03-21 NOTE — Telephone Encounter (Signed)
Pt c/o BP issue: STAT if pt c/o blurred vision, one-sided weakness or slurred speech  1. What are your last 5 BP readings? 219/76  2. Are you having any other symptoms (ex. Dizziness, headache, blurred vision, passed out)? Headache this morning.   3. What is your BP issue? apixaban (ELIQUIS) 5 MG TABS tablet metoprolol succinate (TOPROL XL) 25 MG 24 hr tablet  This two medication are new, wife states he is itching and it makes him sleepy.  It seems it worst at night when he takes the second Eliquis.

## 2018-03-21 NOTE — Telephone Encounter (Signed)
The ptatients BP is extremely high He had been on methyldopa 1000/500/1000 per day Please check with pharmacy to see if this is still difficult to obtain Pt needs to come to HTN clinic

## 2018-03-22 MED FILL — ELIQUIS 5 MG TABLET: 5 | 30 days supply | Qty: 60 | Fill #0

## 2018-03-22 MED FILL — METOPROLOL SUCCINATE ER 25: 25 | 90 days supply | Qty: 45 | Fill #0

## 2018-03-22 NOTE — Telephone Encounter (Signed)
  Wife is calling because she wants to make the Dr aware of the side affects that [name] had when he was on this medication previously. See previous note.  Please call to discuss.

## 2018-03-22 NOTE — Telephone Encounter (Signed)
Returned call to patient's wife who called to report that patient was taken off spironolactone in the past by Jinny Blossom, PharmD for swelling. I reviewed patient's chart and advised wife that Megan's note states patient had swelling due to minoxidil but wife states patient refuses to restart spironolactone. She reports BP today 178/70 mmHg, pulse 62 bpm. We reviewed patient's medications and he has had multiple reactions to various medications. Patient currently taking 3 of the clonidine 0.1 mg daily. I advised that per Dr. Harrington Challenger and Jinny Blossom, patient may take up to 4 clonidine 0.1 mg tabs daily and to use this for additional BP control until his follow-up appointments with [facility] Fib clinic next week and Dr. Harrington Challenger in April. Wife states patient c/o itching with Eliquis and is using triamcinolone cream to treat itching. I advised wife to call back with additional questions or concerns and she thanked me for the call.

## 2018-03-23 NOTE — Telephone Encounter (Signed)
Wait until seen in afib clinc and discuss

## 2018-03-24 ENCOUNTER — Ambulatory Visit: Payer: PPO | Admitting: Physician Assistant

## 2018-03-25 ENCOUNTER — Other Ambulatory Visit: Payer: Self-pay | Admitting: Family Medicine

## 2018-03-25 DIAGNOSIS — M549 Dorsalgia, unspecified: Secondary | ICD-10-CM

## 2018-03-28 ENCOUNTER — Other Ambulatory Visit: Payer: Self-pay

## 2018-03-28 ENCOUNTER — Ambulatory Visit (HOSPITAL_BASED_OUTPATIENT_CLINIC_OR_DEPARTMENT_OTHER)
Admission: RE | Admit: 2018-03-28 | Discharge: 2018-03-28 | Disposition: A | Discharge status: Home / Self Care | Payer: PPO | Source: Ambulatory Visit | Attending: Physician Assistant | Admitting: Physician Assistant

## 2018-03-28 ENCOUNTER — Other Ambulatory Visit: Payer: Self-pay | Admitting: Family Medicine

## 2018-03-28 ENCOUNTER — Ambulatory Visit (HOSPITAL_COMMUNITY)
Admission: RE | Admit: 2018-03-28 | Discharge: 2018-03-28 | Disposition: A | Discharge status: Home / Self Care | Payer: PPO | Source: Ambulatory Visit | Attending: Physician Assistant | Admitting: Physician Assistant

## 2018-03-28 ENCOUNTER — Encounter (HOSPITAL_COMMUNITY): Payer: Self-pay | Admitting: Physician Assistant

## 2018-03-28 VITALS — BP 174/66 | HR 51 | Ht 71.0 in | Wt 160.0 lb

## 2018-03-28 DIAGNOSIS — E785 Hyperlipidemia, unspecified: Secondary | ICD-10-CM | POA: Insufficient documentation

## 2018-03-28 DIAGNOSIS — Z8673 Personal history of transient ischemic attack (TIA), and cerebral infarction without residual deficits: Secondary | ICD-10-CM | POA: Insufficient documentation

## 2018-03-28 DIAGNOSIS — Z7901 Long term (current) use of anticoagulants: Secondary | ICD-10-CM | POA: Diagnosis not present

## 2018-03-28 DIAGNOSIS — Z885 Allergy status to narcotic agent status: Secondary | ICD-10-CM | POA: Diagnosis not present

## 2018-03-28 DIAGNOSIS — I313 Pericardial effusion (noninflammatory): Secondary | ICD-10-CM | POA: Insufficient documentation

## 2018-03-28 DIAGNOSIS — Z79899 Other long term (current) drug therapy: Secondary | ICD-10-CM | POA: Diagnosis not present

## 2018-03-28 DIAGNOSIS — Z888 Allergy status to other drugs, medicaments and biological substances status: Secondary | ICD-10-CM | POA: Diagnosis not present

## 2018-03-28 DIAGNOSIS — I480 Paroxysmal atrial fibrillation: Secondary | ICD-10-CM | POA: Insufficient documentation

## 2018-03-28 DIAGNOSIS — I10 Essential (primary) hypertension: Secondary | ICD-10-CM | POA: Insufficient documentation

## 2018-03-28 DIAGNOSIS — J189 Pneumonia, unspecified organism: Secondary | ICD-10-CM | POA: Diagnosis not present

## 2018-03-28 DIAGNOSIS — Z87891 Personal history of nicotine dependence: Secondary | ICD-10-CM | POA: Diagnosis not present

## 2018-03-28 DIAGNOSIS — Z8546 Personal history of malignant neoplasm of prostate: Secondary | ICD-10-CM | POA: Insufficient documentation

## 2018-03-28 DIAGNOSIS — I4891 Unspecified atrial fibrillation: Secondary | ICD-10-CM | POA: Diagnosis not present

## 2018-03-28 DIAGNOSIS — M549 Dorsalgia, unspecified: Secondary | ICD-10-CM

## 2018-03-28 DIAGNOSIS — D649 Anemia, unspecified: Secondary | ICD-10-CM | POA: Diagnosis not present

## 2018-03-28 MED FILL — traMADol HCL 50 MG TABS: 50 | 25 days supply | Qty: 75 | Fill #0

## 2018-03-28 NOTE — Progress Notes (Addendum)
Primary Care Physician: [name], MD Primary Cardiologist: Dr Harrington Challenger Referring Physician: [facility]   [name] is a 85 y.o. male with a history of HTN, HLD, CVA, carotid disease (s/p CEA 2012), and new onset paroxysmal atrial fibrillation who presents for consultation in the [facility]. The patient was initially diagnosed with atrial fibrillation in the ER 02/24/18 in the setting of pneumonia after presenting with symptoms of fever and chills. ECG at the ER showed afib with RVR HR 125. He was asymptomatic. Of note, his HR was in the normal range at his PCP office the day prior. He was started on metoprolol and Eliquis, his ASA and Plavix were stopped.   On follow up today, patient reports that he feels well. No symptoms of heart racing or palpitations. His BP continues to be difficult to control. His clonidine was recently increase. Patient does report some intermittent itching after starting the Eliquis but he reports this has improved.  Today, he denies symptoms of palpitations, chest pain, shortness of breath, orthopnea, PND, lower extremity edema, dizziness, presyncope, syncope, snoring, daytime somnolence, bleeding, or neurologic sequela. +fever, fatigue The patient is tolerating medications without difficulties and is otherwise without complaint today.    Atrial Fibrillation Risk Factors:  he does not have symptoms or diagnosis of sleep apnea. The patient does not have a history of early familial atrial fibrillation or other arrhythmias.  he has a BMI of Body mass index is 22.32 kg/m.[name] Kitchen Filed Weights   03/28/18 1454  Weight: 72.6 kg    Family History  Problem Relation Age of Onset  . Stroke Brother   . Stroke Sister   . Stroke Mother   . Hyperlipidemia Mother   . Hypertension Mother   . Lung disease Father   . Diabetes Son   . Gout Son   . Obesity Sister   . Alcohol abuse Brother   . Cancer Brother        lung, smoker     Atrial  Fibrillation Management history:  Previous antiarrhythmic drugs: none Previous cardioversions: none Previous ablations: none CHADS2VASC score: 19 (age, CVA, HTN, vascular) Anticoagulation history: Eliquis    Past Medical History:  Diagnosis Date  . Acute bronchitis 06/10/2015  . Allergic rhinitis   . Anemia   . Asthma   . Carotid stenosis    a. s/p Left CEA 2002;  b. Carotid US 3/16:  patent R CEA, L < 40%  . Chronic pain syndrome    Left shoulder, back  . Diverticulosis   . Dyslipidemia   . Epigastric pain 04/05/2016  . Esophageal stricture    Distal, benign  . GERD (gastroesophageal reflux disease)   . Hemorrhoids   . HTN (hypertension)    Negative renal duplex 07-22-11  . Hx of echocardiogram    a. Echo 10/11: mod LVH, EF 55-60%  . Pneumonia 07-2013  . Prostate cancer ([place]) 2000   Seed XRT  . Spondylosis, cervical, with myelopathy 11/26/2015  . Stroke [facility]) 8/01   right thalamic - on chronic Plavix/ASA   Past Surgical History:  Procedure Laterality Date  . APPENDECTOMY  1953  . CAROTID ENDARTERECTOMY Right 01/2000  . CATARACT EXTRACTION Bilateral   . CERVICAL DISC SURGERY  8/07  . INSERTION PROSTATE RADIATION SEED  2000  . [place] SURGERY  1990s  . NASAL SINUS SURGERY     x 3    Current Outpatient Medications  Medication Sig Dispense Refill  .  albuterol (PROVENTIL HFA;VENTOLIN HFA) 108 (90 Base) MCG/ACT inhaler Inhale 1-2 puffs into the lungs every 6 (six) hours as needed for wheezing or shortness of breath. 1 Inhaler 6  . amLODipine (NORVASC) 5 MG tablet Take 1 tablet (5 mg total) by mouth 2 (two) times daily. Pt needs to make appt with Dr. Harrington Challenger for future refills - 1st attempt. Thank you 180 tablet 0  . apixaban (ELIQUIS) 5 MG TABS tablet Take 1 tablet (5 mg total) by mouth 2 (two) times daily for 30 days. 180 tablet 3  . atorvastatin (LIPITOR) 40 MG tablet TAKE 1 TABLET BY MOUTH DAILY. 90 tablet 1  . cholecalciferol (VITAMIN D) 1000 UNITS tablet Take 1,000  Units by mouth daily.    . cloNIDine (CATAPRES) 0.1 MG tablet Take 1 tablet by mouth 3 to 4 times by mouth each day. 120 tablet 11  . docusate sodium (COLACE) 100 MG capsule Take 100 mg by mouth 2 (two) times daily.     . fluticasone (FLONASE) 50 MCG/ACT nasal spray PLACE 2 SPRAYS INTO BOTH NOSTRILS DAILY. 16 g 6  . fluticasone (FLOVENT HFA) 110 MCG/ACT inhaler Inhale 2 puffs into the lungs 2 (two) times daily. 1 Inhaler 0  . gabapentin (NEURONTIN) 300 MG capsule TAKE 1 CAPSULE BY MOUTH 3 TIMES DAILY 270 capsule 1  . metoCLOPramide (REGLAN) 5 MG tablet TAKE 1 TABLET (5 MG) BY MOUTH AT BEDTIME 30 tablet 11  . metoprolol succinate (TOPROL XL) 25 MG 24 hr tablet Take 0.5 tablets (12.5 mg total) by mouth daily. 45 tablet 3  . minoxidil (LONITEN) 10 MG tablet Take 1/2 to 1 tablet by mouth daily. Please make yearly appt with Dr. Harrington Challenger for March before anymore refills. 1st attempt 30 tablet 0  . ondansetron (ZOFRAN ODT) 4 MG disintegrating tablet Take 1 tablet (4 mg total) by mouth every 8 (eight) hours as needed for nausea or vomiting. 20 tablet 0  . pantoprazole (PROTONIX) 40 MG tablet Take 1 tablet (40 mg total) by mouth daily before breakfast. 90 tablet 3  . polyethylene glycol (MIRALAX / GLYCOLAX) packet Take 17 g by mouth daily as needed for mild constipation. Mix in 8 oz liquid and drink    . polyvinyl alcohol (ARTIFICIAL TEARS) 1.4 % ophthalmic solution Place 1 drop into both eyes daily as needed for dry eyes.    . traMADol (ULTRAM) 50 MG tablet TAKE 1 TABLET BY MOUTH EVERY 8 HOURS AS NEEDED 75 tablet 0  . triamcinolone cream (KENALOG) 0.5 % Apply 1 application topically daily as needed (for skin irritation).     . benzonatate (TESSALON) 100 MG capsule Take 1 capsule (100 mg total) by mouth 3 (three) times daily as needed for cough. (Patient not taking: Reported on 03/28/2018) 30 capsule 0   No current facility-administered medications for this encounter.     Allergies  Allergen Reactions  .  Diovan [Valsartan] Other (See Comments)    Elevated potassium hyperkalemia  . Hctz [Hydrochlorothiazide] Other (See Comments)    Swelling and dyspnea   . Hydralazine     Chest pain, GI issues, shortness of breath  . Metformin And Related Other (See Comments)    Unknown reaction  . Prednisone     Suicidal thoughts  . Spironolactone Swelling  . Codeine Nausea Only  . Oxycodone-Acetaminophen Nausea Only    Social History   Socioeconomic History  . Marital status: Married    Spouse name: Not on file  . Number of children: 1  .  Years of education: 58  . Highest education level: Not on file  Occupational History  . Occupation: Retired  Scientific laboratory technician  . Financial resource strain: Not on file  . Food insecurity:    Worry: Not on file    Inability: Not on file  . Transportation needs:    Medical: Not on file    Non-medical: Not on file  Tobacco Use  . Smoking status: Former Smoker    Packs/day: 0.25    Years: 35.00    Pack years: 8.75    Types: Cigarettes    Last attempt to quit: 01/12/1978    Years since quitting: 40.2  . Smokeless tobacco: Current User    Types: Chew  Substance and Sexual Activity  . Alcohol use: No    Comment: Prior alcoholic, sober since 2423  . Drug use: No  . Sexual activity: Not Currently  Lifestyle  . Physical activity:    Days per week: Not on file    Minutes per session: Not on file  . Stress: Not on file  Relationships  . Social connections:    Talks on phone: Not on file    Gets together: Not on file    Attends religious service: Not on file    Active member of club or organization: Not on file    Attends meetings of clubs or organizations: Not on file    Relationship status: Not on file  . Intimate partner violence:    Fear of current or ex partner: Not on file    Emotionally abused: Not on file    Physically abused: Not on file    Forced sexual activity: Not on file  Other Topics Concern  . Not on file  Social History Narrative    Lives at home w/ his wife   Right-handed    Caffeine: drinks a lot of coffee per report     ROS- All systems are reviewed and negative except as per the HPI above.  Physical Exam: Vitals:   03/28/18 1454  Weight: 72.6 kg  Height: 5\' 11"  (1.803 m)    GEN- The patient is well appearing elderly male, alert and oriented x 3 today.   HEENT-head normocephalic, atraumatic, sclera clear, conjunctiva pink, hearing intact, trachea midline. Lungs- Clear to ausculation bilaterally, normal work of breathing Heart- Regular rate and rhythm, no murmurs, rubs or gallops  GI- soft, NT, ND, + BS Extremities- no clubbing, cyanosis, or edema MS- no significant deformity or atrophy Skin- no rash or lesion Psych- euthymic mood, full affect Neuro- strength and sensation are intact   Wt Readings from Last 3 Encounters:  03/28/18 72.6 kg  03/15/18 71.3 kg  03/04/18 74.4 kg    EKG today demonstrates sinus bradycardia, HR 51, LAFB, slow R wave prog, PR 180, QRS 94, QTc 412  Epic records are reviewed at length today  Assessment and Plan:  1. Paroxysmal atrial fibrillation The patient has had paroxysmal atrial fibrillation in the setting of pneumonia. Continue Eliquis 5 mg BID with elevated CHADS2VASC score. Echocardiogram results pending Continue Toprol 12.5 mg daily  This patients CHA2DS2-VASc Score and unadjusted Ischemic Stroke Rate (% per year) is equal to 9.7 % stroke rate/year from a score of 6  Above score calculated as 1 point each if present [CHF, HTN, DM, Vascular=MI/PAD/Aortic Plaque, Age if 65-74, or Male] Above score calculated as 2 points each if present [Age > 75, or Stroke/TIA/TE]   2. HTN Elevated today. His clonidine was recently increased.  No change today.  3. Carotid disease S/p R CEA. Followed by Dr Donnetta Hutching.    Follow up with Dr Harrington Challenger as scheduled. Afib clinic as needed.   Addendum: Labs 04/05/18 - Hgb 12.2, Cr 0.92, BUN 18, K+ 5.1  Adline Peals PA-C Afib  [facility] [address] [place], [place] [address] [phone] 03/28/2018 3:01 PM

## 2018-03-28 NOTE — Progress Notes (Signed)
Echocardiogram 2D Echocardiogram has been performed.  Matilde Bash 03/28/2018, 2:47 PM

## 2018-03-28 NOTE — Telephone Encounter (Signed)
Patient of Dr Charlett Blake calling to check status of refill. States that he is out of this medication. Advised that we are waiting for the provider to advise on the refill request. Please advise.

## 2018-03-29 NOTE — Telephone Encounter (Signed)
Medication was sent in on 03/28/18

## 2018-03-29 NOTE — Telephone Encounter (Signed)
Pt reported that intermittent itching that occurred when started Eliquis has improved per afib clinic ov note yesterday.

## 2018-03-30 ENCOUNTER — Other Ambulatory Visit (HOSPITAL_COMMUNITY): Payer: Self-pay | Admitting: *Deleted

## 2018-03-30 DIAGNOSIS — I480 Paroxysmal atrial fibrillation: Secondary | ICD-10-CM

## 2018-04-04 ENCOUNTER — Other Ambulatory Visit: Payer: Self-pay | Admitting: Internal Medicine

## 2018-04-05 ENCOUNTER — Other Ambulatory Visit: Payer: PPO

## 2018-04-05 DIAGNOSIS — I480 Paroxysmal atrial fibrillation: Secondary | ICD-10-CM | POA: Diagnosis not present

## 2018-04-05 MED FILL — METOCLOPRAMIDE 5 MG TABLET: 5 | 30 days supply | Qty: 30 | Fill #0

## 2018-04-05 NOTE — Telephone Encounter (Signed)
6 months of refills

## 2018-04-05 NOTE — Telephone Encounter (Signed)
How many refills on his generic reglan Sir? Thank you.

## 2018-04-06 ENCOUNTER — Other Ambulatory Visit: Payer: Self-pay | Admitting: Internal Medicine

## 2018-04-06 MED FILL — MINOXIDIL 10 MG TABLET: 10 | 90 days supply | Qty: 90 | Fill #0

## 2018-04-12 NOTE — Addendum Note (Signed)
Encounter addended by: Oliver Barre, PA on: 04/12/2018 10:53 AM  Actions taken: Clinical Note Signed

## 2018-04-14 MED FILL — ELIQUIS 5 MG TABLET: 5 | 30 days supply | Qty: 60 | Fill #1

## 2018-04-18 ENCOUNTER — Other Ambulatory Visit: Payer: Self-pay | Admitting: Internal Medicine

## 2018-04-18 ENCOUNTER — Other Ambulatory Visit: Payer: Self-pay | Admitting: Family Medicine

## 2018-04-18 ENCOUNTER — Ambulatory Visit: Payer: PPO | Admitting: *Deleted

## 2018-04-18 DIAGNOSIS — M549 Dorsalgia, unspecified: Secondary | ICD-10-CM

## 2018-04-18 MED FILL — CloNIDine HCL 0.1 MG TAB: 0.1 | 30 days supply | Qty: 120 | Fill #0

## 2018-04-18 MED FILL — ATORVASTATIN 40 MG TABLET: 40 | 90 days supply | Qty: 90 | Fill #0

## 2018-04-18 MED FILL — PANTOPRAZOLE SOD DR 40 MG T: 40 | 90 days supply | Qty: 90 | Fill #0

## 2018-04-19 ENCOUNTER — Telehealth: Payer: Self-pay

## 2018-04-19 NOTE — Telephone Encounter (Signed)
Pt does NOT have smart phone or Internet access. Pt agreeable to PHONE visit.      Virtual Visit Pre-Appointment Phone Call  Steps For Call:  1. Confirm consent - "In the setting of the current Covid19 crisis, you are scheduled for a PHONE visit with your provider on 4/13 at 330pm.  Just as we do with many in-office visits, in order for you to participate in this visit, we must obtain consent.  If you'd like, I can send this to your mychart (if signed up) or email for you to review.  Otherwise, I can obtain your verbal consent now.  All virtual visits are billed to your insurance company just like a normal visit would be.  By agreeing to a virtual visit, we'd like you to understand that the technology does not allow for your provider to perform an examination, and thus may limit your provider's ability to fully assess your condition.  Finally, though the technology is pretty good, we cannot assure that it will always work on either your or our end, and in the setting of a video visit, we may have to convert it to a phone-only visit.  In either situation, we cannot ensure that we have a secure connection.  Are you willing to proceed?"  2. Give patient instructions for WebEx download to smartphone as below if video visit  3. Advise patient to be prepared with any vital sign or heart rhythm information, their current medicines, and a piece of paper and pen handy for any instructions they may receive the day of their visit  4. Inform patient they will receive a phone call 15 minutes prior to their appointment time (may be from unknown caller ID) so they should be prepared to answer  5. Confirm that appointment type is correct in Epic appointment notes (video vs telephone)    TELEPHONE CALL NOTE  [name] has been deemed a candidate for a follow-up tele-health visit to limit community exposure during the Covid-19 pandemic. I spoke with the patient via phone to ensure availability of  phone/video source, confirm preferred email & phone number, and discuss instructions and expectations.  I reminded [name] to be prepared with any vital sign and/or heart rhythm information that could potentially be obtained via home monitoring, at the time of his visit. I reminded [name] to expect a phone call at the time of his visit if his visit.  Did the patient verbally acknowledge consent to treatment? YES  Wilma Flavin, RN 04/19/2018 2:22 PM  .

## 2018-04-20 MED FILL — traMADol HCL 50 MG TABS: 50 | 25 days supply | Qty: 75 | Fill #0

## 2018-04-20 NOTE — Telephone Encounter (Signed)
Requesting:tramadol Contract:yes UDS:low risk next screen 07/14/18 Last OV:03/04/18 Next OV:07/12/18 Last Refill:03/28/18  #[id number] Database:   Please advise

## 2018-04-25 ENCOUNTER — Telehealth (INDEPENDENT_AMBULATORY_CARE_PROVIDER_SITE_OTHER): Payer: PPO | Admitting: Internal Medicine

## 2018-04-25 ENCOUNTER — Other Ambulatory Visit: Payer: Self-pay

## 2018-04-25 VITALS — BP 169/72 | HR 76 | Ht 71.0 in | Wt 162.0 lb

## 2018-04-25 DIAGNOSIS — I10 Essential (primary) hypertension: Secondary | ICD-10-CM

## 2018-04-25 DIAGNOSIS — Z7189 Other specified counseling: Secondary | ICD-10-CM

## 2018-04-25 DIAGNOSIS — Z8679 Personal history of other diseases of the circulatory system: Secondary | ICD-10-CM

## 2018-04-25 DIAGNOSIS — I6350 Cerebral infarction due to unspecified occlusion or stenosis of unspecified cerebral artery: Secondary | ICD-10-CM

## 2018-04-25 NOTE — Patient Instructions (Signed)
Medication Instructions:  Stay on same medications.  If you need a refill on your cardiac medications before your next appointment, please call your pharmacy.   Lab work: none  Testing/Procedures: none  Follow-Up: At [facility], you and your health needs are our priority.  As part of our continuing mission to provide you with exceptional heart care, we have created designated Provider Care Teams.  These Care Teams include your primary Cardiologist (physician) and Advanced Practice Providers (APPs -  Physician Assistants and Nurse Practitioners) who all work together to provide you with the care you need, when you need it. . Dr. Harrington Challenger will plan to see you in the office in October.  Please call the office 2-3 months in advance to schedule this appointment.  Any Other Special Instructions Will Be Listed Below (If Applicable).

## 2018-04-25 NOTE — Progress Notes (Signed)
Virtual Visit via Telephone Note   This visit type was conducted due to national recommendations for restrictions regarding the COVID-19 Pandemic (e.g. social distancing) in an effort to limit this patient's exposure and mitigate transmission in our community.  Due to his co-morbid illnesses, this patient is at least at moderate risk for complications without adequate follow up.  This format is felt to be most appropriate for this patient at this time.  The patient did not have access to video technology/had technical difficulties with video requiring transitioning to audio format only (telephone).  All issues noted in this document were discussed and addressed.  No physical exam could be performed with this format.  Please refer to the patient's chart for his  consent to telehealth for [facility].   Evaluation Performed:  Follow-up visit  Date:  04/25/2018   ID:  [name], DOB 1929/08/11, MRN [id number]  Patient Location: Home  Provider Location: Home  PCP:  [name], MD  Cardiologist: Harrington Challenger  Electrophysiologist:  None   Chief Complaint:  Called for f/u of HTN     History of Present Illness:    [name] is a 85 y.o. male who presents via audio/video conferencing for a telehealth visit today.    PT BP is labile 149-189/ 60s     Pulse in 50s      Wife reports patient's breathing is OK    Says he is not having CP     The patient does not have symptoms concerning for COVID-19 infection (fever, chills, cough, or new shortness of breath).    Past Medical History:  Diagnosis Date  . Acute bronchitis 06/10/2015  . Allergic rhinitis   . Anemia   . Asthma   . Carotid stenosis    a. s/p Left CEA 2002;  b. Carotid US 3/16:  patent R CEA, L < 40%  . Chronic pain syndrome    Left shoulder, back  . Diverticulosis   . Dyslipidemia   . Epigastric pain 04/05/2016  . Esophageal stricture    Distal, benign  . GERD (gastroesophageal reflux disease)   . Hemorrhoids   .  HTN (hypertension)    Negative renal duplex 07-22-11  . Hx of echocardiogram    a. Echo 10/11: mod LVH, EF 55-60%  . Pneumonia 07-2013  . Prostate cancer ([place]) 2000   Seed XRT  . Spondylosis, cervical, with myelopathy 11/26/2015  . Stroke [facility]) 8/01   right thalamic - on chronic Plavix/ASA   Past Surgical History:  Procedure Laterality Date  . APPENDECTOMY  1953  . CAROTID ENDARTERECTOMY Right 01/2000  . CATARACT EXTRACTION Bilateral   . CERVICAL DISC SURGERY  8/07  . INSERTION PROSTATE RADIATION SEED  2000  . [place] SURGERY  1990s  . NASAL SINUS SURGERY     x 3     Current Meds  Medication Sig  . albuterol (PROVENTIL HFA;VENTOLIN HFA) 108 (90 Base) MCG/ACT inhaler Inhale 1-2 puffs into the lungs every 6 (six) hours as needed for wheezing or shortness of breath.  [name] Kitchen amLODipine (NORVASC) 5 MG tablet Take 1 tablet (5 mg total) by mouth 2 (two) times daily. Pt needs to make appt with Dr. Harrington Challenger for future refills - 1st attempt. Thank you  . apixaban (ELIQUIS) 5 MG TABS tablet Take 1 tablet (5 mg total) by mouth 2 (two) times daily for 30 days.  [name] Kitchen atorvastatin (LIPITOR) 40 MG tablet TAKE 1 TABLET BY MOUTH DAILY.  [name] Kitchen  cloNIDine (CATAPRES) 0.1 MG tablet TAKE 1 TABLET BY MOUTH THREE TO FOUR TIMES DAILY  . docusate sodium (COLACE) 100 MG capsule Take 100 mg by mouth 2 (two) times daily.   . fluticasone (FLOVENT HFA) 110 MCG/ACT inhaler Inhale 2 puffs into the lungs 2 (two) times daily.  [name] Kitchen gabapentin (NEURONTIN) 300 MG capsule TAKE 1 CAPSULE BY MOUTH 3 TIMES DAILY  . metoCLOPramide (REGLAN) 5 MG tablet TAKE 1 TABLET BY MOUTH AT BEDTIME  . metoprolol succinate (TOPROL XL) 25 MG 24 hr tablet Take 0.5 tablets (12.5 mg total) by mouth daily.  . minoxidil (LONITEN) 10 MG tablet TAKE 1/2-1 TABLET BY MOUTH ONCE A DAY (NEEDS OV)  . pantoprazole (PROTONIX) 40 MG tablet TAKE 1 TABLET (40 MG TOTAL) BY MOUTH DAILY BEFORE BREAKFAST.  [name] Kitchen polyethylene glycol (MIRALAX / GLYCOLAX) packet Take 17 g by mouth  daily as needed for mild constipation. Mix in 8 oz liquid and drink  . polyvinyl alcohol (ARTIFICIAL TEARS) 1.4 % ophthalmic solution Place 1 drop into both eyes daily as needed for dry eyes.  . traMADol (ULTRAM) 50 MG tablet TAKE 1 TABLET BY MOUTH EVERY 8 HOURS AS NEEDED  . triamcinolone cream (KENALOG) 0.5 % Apply 1 application topically daily as needed (for skin irritation).      Allergies:   Diovan [valsartan]; Hctz [hydrochlorothiazide]; Hydralazine; Metformin and related; Prednisone; Spironolactone; Codeine; and Oxycodone-acetaminophen   Social History   Tobacco Use  . Smoking status: Former Smoker    Packs/day: 0.25    Years: 35.00    Pack years: 8.75    Types: Cigarettes    Last attempt to quit: 01/12/1978    Years since quitting: 40.3  . Smokeless tobacco: Current User    Types: Chew  Substance Use Topics  . Alcohol use: No    Comment: Prior alcoholic, sober since 9211  . Drug use: No     Family Hx: The patient's family history includes Alcohol abuse in his brother; Cancer in his brother; Diabetes in his son; Gout in his son; Hyperlipidemia in his mother; Hypertension in his mother; Lung disease in his father; Obesity in his sister; Stroke in his brother, mother, and sister.  ROS:   Please see the history of present illness.    All other systems reviewed and are negative.   Prior CV studies:   The following studies were reviewed today:   Labs/Other Tests and Data Reviewed:    EKG:   Not done    Recent Labs: 05/25/2017: Pro B Natriuretic peptide (BNP) 148.0 01/13/2018: TSH 1.94 02/24/2018: ALT 11 03/15/2018: BUN 20; Creatinine, Ser 1.01; Hemoglobin 11.3; Platelets 375; Potassium 5.1; Sodium 140   Recent Lipid Panel Lab Results  Component Value Date/Time   CHOL 114 01/13/2018 02:10 PM   TRIG 85.0 01/13/2018 02:10 PM   HDL 46.50 01/13/2018 02:10 PM   CHOLHDL 2 01/13/2018 02:10 PM   LDLCALC 50 01/13/2018 02:10 PM    Wt Readings from Last 3 Encounters:  04/25/18  162 lb (73.5 kg)  03/28/18 160 lb (72.6 kg)  03/15/18 157 lb 1.9 oz (71.3 kg)     Objective:    Vital Signs:  BP (!) 169/72   Pulse 76   Ht 5\' 11"  (1.803 m)   Wt 162 lb (73.5 kg)   BMI 22.59 kg/m    Spoke to wife  Pt in another room   ASSESSMENT & PLAN:    1   HTN   Remains labile and mostly high  I have asked that he take minoxidil 10 mg bid F/U with call in a few wks time Keep on onther meds  2   Hx PAF    Does not sound like he is having many palpitations.   Keep on anticoagulant   3  COVID-19 Education: The signs and symptoms of COVID-19 were discussed with the patient and how to seek care for testing (follow up with PCP or arrange E-visit).The importance of social distancing was discussed today.  Time:   Today, I have spent 10 minutes with the patient s wife with telehealth technology discussing the above problems.     Medication Adjustments/Labs and Tests Ordered: Current medicines are reviewed at length with the patient today.  Concerns regarding medicines are outlined above.  Tests Ordered: No orders of the defined types were placed in this encounter.   Medication Changes: No orders of the defined types were placed in this encounter.   Disposition:  Follow up with call in a few wks    F/U in clinic in September    Signed, [name], MD  04/25/2018 2:49 PM    [place]

## 2018-05-02 ENCOUNTER — Other Ambulatory Visit: Payer: Self-pay | Admitting: Internal Medicine

## 2018-05-02 MED FILL — METOCLOPRAMIDE 5 MG TABLET: 5 | 30 days supply | Qty: 30 | Fill #1

## 2018-05-05 ENCOUNTER — Telehealth: Payer: Self-pay | Admitting: Internal Medicine

## 2018-05-05 NOTE — Telephone Encounter (Signed)
New Message   Pt c/o medication issue:  1. Name of Medication: Minoxidil   2. How are you currently taking this medication (dosage and times per day)? 10mg  1/2-1 Tablet by mouth once a day  3. Are you having a reaction (difficulty breathing--STAT)? NO  4. What is your medication issue? Patient states dosage was changed and wants a nurse to call him back to confirm.

## 2018-05-06 ENCOUNTER — Telehealth: Payer: Self-pay | Admitting: Internal Medicine

## 2018-05-06 MED FILL — GABAPENTIN 300 MG CAPSULE: 300 | 90 days supply | Qty: 270 | Fill #1

## 2018-05-06 NOTE — Telephone Encounter (Signed)
pts wife called back I confirmed minoxidil dose with her

## 2018-05-06 NOTE — Telephone Encounter (Signed)
Left detailed message on self identified VM that on 04/25/18, Dr. Harrington Challenger recommended he increase minoxidil to 10 mg - twice a day. Asked that they call back to confirm message was received, and/or if there are other questions or concerns. Otherwise pls call back in a 2-3 weeks with how BP is running-per last ov instructions.

## 2018-05-06 NOTE — Telephone Encounter (Signed)
Lm to call back ./cy 

## 2018-05-06 NOTE — Telephone Encounter (Signed)
See telephone encounter from 05/05/18.

## 2018-05-06 NOTE — Telephone Encounter (Signed)
F/U Message           Patient's wife is calling back, she missed the call because of he cell phone issues, pls call again @ [phone] that is the patient cell phone #.

## 2018-05-11 ENCOUNTER — Other Ambulatory Visit: Payer: Self-pay | Admitting: Family Medicine

## 2018-05-11 DIAGNOSIS — M549 Dorsalgia, unspecified: Secondary | ICD-10-CM

## 2018-05-12 NOTE — Telephone Encounter (Signed)
Requesting:tramadol  Contract:yes UDS:n/a Last OV:03/04/18 Next OV:07/12/18 Last Refill:04/20/18  #[id number] Database:   Please advise

## 2018-05-13 MED FILL — traMADol HCL 50 MG TABS: 50 | 25 days supply | Qty: 75 | Fill #0

## 2018-05-14 MED FILL — CloNIDine HCL 0.1 MG TAB: 0.1 | 30 days supply | Qty: 120 | Fill #1

## 2018-05-27 ENCOUNTER — Other Ambulatory Visit: Payer: Self-pay

## 2018-05-27 ENCOUNTER — Telehealth (INDEPENDENT_AMBULATORY_CARE_PROVIDER_SITE_OTHER): Payer: PPO | Admitting: Internal Medicine

## 2018-05-27 ENCOUNTER — Telehealth: Payer: Self-pay | Admitting: Internal Medicine

## 2018-05-27 VITALS — BP 166/70 | HR 66

## 2018-05-27 DIAGNOSIS — I10 Essential (primary) hypertension: Secondary | ICD-10-CM | POA: Diagnosis not present

## 2018-05-27 DIAGNOSIS — R609 Edema, unspecified: Secondary | ICD-10-CM

## 2018-05-27 MED ORDER — APIXABAN 5 MG PO TABS: 5.0000 mg | ORAL_TABLET | Freq: Two times a day (BID) | ORAL | 3 refills | Status: DC

## 2018-05-27 MED FILL — ELIQUIS 5 MG TABLET: 5 | 90 days supply | Qty: 180 | Fill #0

## 2018-05-27 NOTE — Telephone Encounter (Addendum)
Patient states the his face and his foot are swollen, right worse than left. He is audibly wheezing during the call and states he has been for a couple days.  He states it has gotten worse the last few days.  Attributes to increasing minoxidil to 10 mg twice a day.    Notices it is worse when gets up in am.  Feels able to swallow ok.  But feels throat is tight.  Adv to take benadryl one tablet, now.  Has not been using flovent.  Sitting outside while on phone.   Spoke with Dr. Harrington Challenger.  She recommends televisit with pt today (VIDEO).  Appointment made.  Called pt and spoke w daughter, trying to get them set up for video. Daughter has pulse ox at home and will bring it by for him to use.   [place] for VIDEO with daughter's cell phone: [phone]) She will only be at their house a few more minutes. PHONE visit if daughter is not there. [phone])

## 2018-05-27 NOTE — Progress Notes (Signed)
Virtual Visit via Telephone Note   This visit type was conducted due to national recommendations for restrictions regarding the COVID-19 Pandemic (e.g. social distancing) in an effort to limit this patient's exposure and mitigate transmission in our community.  Due to his co-morbid illnesses, this patient is at least at moderate risk for complications without adequate follow up.  This format is felt to be most appropriate for this patient at this time.  The patient did not have access to video technology/had technical difficulties with video requiring transitioning to audio format only (telephone).  All issues noted in this document were discussed and addressed.  No physical exam could be performed with this format.  Please refer to the patient's chart for his  consent to telehealth for [name].   Date:  05/27/2018   ID:  [name], DOB 05/14/1929, MRN [id number]  Patient Location: Home Provider Location: Home  PCP:  [name], MD  Cardiologist: Harrington Challenger  Evaluation Performed:  Follow-Up Visit  Chief Complaint:  F/U of HTN and SOB   History of Present Illness:    [name] is a 85 y.o. male with longstanding history of HTN   Labile in past, diffcult to control   Recnetly restarted on minoxidil    BP was elevated and dose increase    The pt's wife called to to say he is having swelling in R foot, L not so much  Not eating much   SOme wheezing    Has gone out to work in yard    Denies CP      The patient does not have symptoms concerning for COVID-19 infection (fever, chills, cough, or new shortness of breath).    Past Medical History:  Diagnosis Date  . Acute bronchitis 06/10/2015  . Allergic rhinitis   . Anemia   . Asthma   . Carotid stenosis    a. s/p Left CEA 2002;  b. Carotid US 3/16:  patent R CEA, L < 40%  . Chronic pain syndrome    Left shoulder, back  . Diverticulosis   . Dyslipidemia   . Epigastric pain 04/05/2016  . Esophageal stricture    Distal,  benign  . GERD (gastroesophageal reflux disease)   . Hemorrhoids   . HTN (hypertension)    Negative renal duplex 07-22-11  . Hx of echocardiogram    a. Echo 10/11: mod LVH, EF 55-60%  . Pneumonia 07-2013  . Prostate cancer ([place]) 2000   Seed XRT  . Spondylosis, cervical, with myelopathy 11/26/2015  . Stroke [facility]) 8/01   right thalamic - on chronic Plavix/ASA   Past Surgical History:  Procedure Laterality Date  . APPENDECTOMY  1953  . CAROTID ENDARTERECTOMY Right 01/2000  . CATARACT EXTRACTION Bilateral   . CERVICAL DISC SURGERY  8/07  . INSERTION PROSTATE RADIATION SEED  2000  . [place] SURGERY  1990s  . NASAL SINUS SURGERY     x 3     Current Meds  Medication Sig  . albuterol (PROVENTIL HFA;VENTOLIN HFA) 108 (90 Base) MCG/ACT inhaler Inhale 1-2 puffs into the lungs every 6 (six) hours as needed for wheezing or shortness of breath.  [name] Kitchen amLODipine (NORVASC) 5 MG tablet Take 1 tablet (5 mg total) by mouth 2 (two) times daily. Pt needs to make appt with Dr. Harrington Challenger for future refills - 1st attempt. Thank you  . atorvastatin (LIPITOR) 40 MG tablet TAKE 1 TABLET BY MOUTH DAILY.  . cloNIDine (CATAPRES) 0.1 MG  tablet TAKE 1 TABLET BY MOUTH THREE TO FOUR TIMES DAILY  . docusate sodium (COLACE) 100 MG capsule Take 100 mg by mouth 2 (two) times daily.   [name] Kitchen gabapentin (NEURONTIN) 300 MG capsule TAKE 1 CAPSULE BY MOUTH 3 TIMES DAILY  . metoCLOPramide (REGLAN) 5 MG tablet TAKE 1 TABLET BY MOUTH AT BEDTIME  . metoprolol succinate (TOPROL XL) 25 MG 24 hr tablet Take 0.5 tablets (12.5 mg total) by mouth daily.  . minoxidil (LONITEN) 10 MG tablet TAKE 1/2-1 TABLET BY MOUTH ONCE A DAY (NEEDS OV) (Patient taking differently: 10 mg 2 (two) times daily. TAKE 1/2-1 TABLET BY MOUTH ONCE A DAY)  . pantoprazole (PROTONIX) 40 MG tablet TAKE 1 TABLET (40 MG TOTAL) BY MOUTH DAILY BEFORE BREAKFAST.  [name] Kitchen polyethylene glycol (MIRALAX / GLYCOLAX) packet Take 17 g by mouth daily as needed for mild constipation. Mix  in 8 oz liquid and drink  . polyvinyl alcohol (ARTIFICIAL TEARS) 1.4 % ophthalmic solution Place 1 drop into both eyes daily as needed for dry eyes.  . traMADol (ULTRAM) 50 MG tablet TAKE 1 TABLET BY MOUTH EVERY 8 HOURS AS NEEDED  . triamcinolone cream (KENALOG) 0.5 % Apply 1 application topically daily as needed (for skin irritation).      Allergies:   Diovan [valsartan]; Hctz [hydrochlorothiazide]; Hydralazine; Metformin and related; Prednisone; Spironolactone; Codeine; and Oxycodone-acetaminophen   Social History   Tobacco Use  . Smoking status: Former Smoker    Packs/day: 0.25    Years: 35.00    Pack years: 8.75    Types: Cigarettes    Last attempt to quit: 01/12/1978    Years since quitting: 40.3  . Smokeless tobacco: Current User    Types: Chew  Substance Use Topics  . Alcohol use: No    Comment: Prior alcoholic, sober since 1610  . Drug use: No     Family Hx: The patient's family history includes Alcohol abuse in his brother; Cancer in his brother; Diabetes in his son; Gout in his son; Hyperlipidemia in his mother; Hypertension in his mother; Lung disease in his father; Obesity in his sister; Stroke in his brother, mother, and sister.  ROS:   Please see the history of present illness.     All other systems reviewed and are negative.   Prior CV studies:   The following studies were reviewed today:   Labs/Other Tests and Data Reviewed:    EKG:  No ECG reviewed.  Recent Labs: 01/13/2018: TSH 1.94 02/24/2018: ALT 11 03/15/2018: BUN 20; Creatinine, Ser 1.01; Hemoglobin 11.3; Platelets 375; Potassium 5.1; Sodium 140   Recent Lipid Panel Lab Results  Component Value Date/Time   CHOL 114 01/13/2018 02:10 PM   TRIG 85.0 01/13/2018 02:10 PM   HDL 46.50 01/13/2018 02:10 PM   CHOLHDL 2 01/13/2018 02:10 PM   LDLCALC 50 01/13/2018 02:10 PM    Wt Readings from Last 3 Encounters:  04/25/18 162 lb (73.5 kg)  03/28/18 160 lb (72.6 kg)  03/15/18 157 lb 1.9 oz (71.3 kg)      Objective:    Vital Signs:  BP (!) 166/70 (BP Location: Right Arm)   Pulse 66    VITAL SIGNS:  reviewed  ASSESSMENT & PLAN:    Pt and wife are very difficult historians   I have tried to reach daughter tod discuss Swelling is labile, worse in AM    One side not other  Has some wheezing   He does have known reactive airway dz  No wheezing now  WIll try to contact daughter   Would back down on minoxidil to daily    Follow edema      COVID-19 Education: The signs and symptoms of COVID-19 were discussed with the patient and how to seek care for testing (follow up with PCP or arrange E-visit).  The importance of social distancing was discussed today.  Time:   Today, I have spent 15 minutes with the patient with telehealth technology discussing the above problems.     Medication Adjustments/Labs and Tests Ordered: Current medicines are reviewed at length with the patient today.  Concerns regarding medicines are outlined above.   Tests Ordered: No orders of the defined types were placed in this encounter.   Medication Changes: Meds ordered this encounter  Medications  . apixaban (ELIQUIS) 5 MG TABS tablet    Sig: Take 1 tablet (5 mg total) by mouth 2 (two) times daily.    Dispense:  180 tablet    Refill:  3    Disposition:  Follow up based on further discussion  Signed, [name], MD  05/27/2018 4:31 PM    [place]

## 2018-05-27 NOTE — Telephone Encounter (Signed)
New Message    Pt c/o medication issue:  1. Name of Medication: Minoxidil   2. How are you currently taking this medication (dosage and times per day)? 10mg  2x daily   3. Are you having a reaction (difficulty breathing--STAT)? No   4. What is your medication issue? Pt wakes up swollen, and after he drinks some water it helps some. His feet and face are swollen

## 2018-05-28 NOTE — Telephone Encounter (Signed)
Spoke to pt's wife on Friday   Difficult history     SOme swelling legs R greater than L Pts wife says worse in AM waking up Also some wheezing  Did say he had been outside some  BP is labile   HIghest 170 Swelling may be worse since minoxidil increased    Tried to contact daughter x 2   Left VM    Spoke to wife again   Says her husbands legs are better   Breathing is OK today  No wheezes Now on minoxidil 1 tab (not 2)  WIll need refills on minoxidil Also would recommRx of flovent 1 puff bid  Told wife to keep track of BP

## 2018-05-30 ENCOUNTER — Other Ambulatory Visit: Payer: Self-pay | Admitting: Internal Medicine

## 2018-05-30 MED ORDER — MINOXIDIL 10 MG PO TABS: ORAL_TABLET | ORAL | 3 refills | Status: DC

## 2018-05-30 MED FILL — AMLODIPINE BESYLATE 5 MG TA: 5 | 90 days supply | Qty: 180 | Fill #0

## 2018-05-30 MED FILL — METOCLOPRAMIDE 5 MG TABLET: 5 | 30 days supply | Qty: 30 | Fill #2

## 2018-05-30 NOTE — Telephone Encounter (Signed)
I spoke to the patient's wife and gave her Dr Harrington Challenger' medication recommendations and reminded her to monitor the patient's BP throughout the week.  She verbalized understanding.

## 2018-06-02 ENCOUNTER — Other Ambulatory Visit: Payer: Self-pay | Admitting: Family Medicine

## 2018-06-02 DIAGNOSIS — M549 Dorsalgia, unspecified: Secondary | ICD-10-CM

## 2018-06-03 ENCOUNTER — Telehealth: Payer: Self-pay | Admitting: Internal Medicine

## 2018-06-03 MED ORDER — FLUTICASONE PROPIONATE HFA 110 MCG/ACT IN AERO: 2.0000 | INHALATION_SPRAY | Freq: Two times a day (BID) | RESPIRATORY_TRACT | 0 refills | Status: DC

## 2018-06-03 MED FILL — FLOVENT HFA 110 MCG INHALER: 110 | 30 days supply | Qty: 12 | Fill #0

## 2018-06-03 NOTE — Telephone Encounter (Signed)
New Message   Pt c/o medication issue:  1. Name of Medication: Patient unsure of the name but states it's a type of pray cortisone  2. How are you currently taking this medication (dosage and times per day)? N/A  3. Are you having a reaction (difficulty breathing--STAT)? No  4. What is your medication issue? Patient states Dr. Harrington Challenger was suppose to order cortisone inhaler for patient to take but never sent script over to pharmacy.

## 2018-06-03 NOTE — Telephone Encounter (Signed)
Refilled flovent.

## 2018-06-03 NOTE — Telephone Encounter (Signed)
New Message   Pt c/o BP issue: STAT if pt c/o blurred vision, one-sided weakness or slurred speech  1. What are your last 5 BP readings? 5/10 168/70, 5/11 181/62, 5/12 164/64, 5/13 181/62, 5/14 167/64, 5/15 left arm 173/96 Right arm 162/62 5/18 172/60, 5/20 181/76, 5/21 167/116 taken later 175/93, and 5/22 160/76  2. Are you having any other symptoms (ex. Dizziness, headache, blurred vision, passed out)? NO  3. What is your BP issue? Patient was told to monitor husbands BP's by Dr. Harrington Challenger and she's calling to report them.

## 2018-06-08 MED FILL — traMADol HCL 50 MG TABS: 50 | 25 days supply | Qty: 75 | Fill #0

## 2018-06-08 NOTE — Telephone Encounter (Signed)
Requesting:tramadol  Contract:yes UDS:low risk next screen 07/14/18 Last OV:03/04/18 Next OV:07/12/18 Last Refill:05/12/18  #[id number] Database:   Please advise

## 2018-06-10 MED FILL — MINOXIDIL 10 MG TABLET: 10 | 90 days supply | Qty: 90 | Fill #0

## 2018-06-27 MED FILL — METOCLOPRAMIDE 5 MG TABLET: 5 | 30 days supply | Qty: 30 | Fill #3

## 2018-06-28 MED FILL — METOPROLOL SUCCINATE ER 25: 25 | 90 days supply | Qty: 45 | Fill #1

## 2018-06-29 MED FILL — CloNIDine HCL 0.1 MG TAB: 0.1 | 30 days supply | Qty: 120 | Fill #2

## 2018-06-30 ENCOUNTER — Other Ambulatory Visit: Payer: Self-pay | Admitting: Family Medicine

## 2018-06-30 DIAGNOSIS — M549 Dorsalgia, unspecified: Secondary | ICD-10-CM

## 2018-06-30 NOTE — Progress Notes (Addendum)
Virtual Visit via Video Note  I connected with patient on 07/01/18 at  2:00 PM EDT by audio enabled telemedicine application and verified that I am speaking with the correct person using two identifiers.   THIS ENCOUNTER IS A VIRTUAL VISIT DUE TO COVID-19 - PATIENT WAS NOT SEEN IN THE OFFICE. PATIENT HAS CONSENTED TO VIRTUAL VISIT / TELEMEDICINE VISIT   Location of patient: home  Location of provider: office  I discussed the limitations of evaluation and management by telemedicine and the availability of in person appointments. The patient expressed understanding and agreed to proceed.   Subjective:   [name] is a 85 y.o. male who presents for Medicare Annual/Subsequent preventive examination.  Review of Systems: No ROS.  Medicare Wellness Virtual Visit.  Visual/audio telehealth visit, UTA vital signs.   See social history for additional risk factors. Cardiac Risk Factors include: advanced age (>92men, >72 women);dyslipidemia;male gender;hypertension Sleep patterns: Sleeps well. Home Safety/Smoke Alarms: Feels safe in home. Smoke alarms in place.  Lives with wife and 3 cats in 1 story home. Walk-in shower.  Male:    PSA-  Lab Results  Component Value Date   PSA 0.00 Repeated and verified X2. (L) 09/22/2017   PSA 0.02 (L) 12/11/2008   PSA 8.08 (H) 05/13/2006       Objective:     Advanced Directives 07/01/2018 02/24/2018 08/17/2017 08/11/2016 04/14/2016 04/09/2015 11/15/2014  Does Patient Have a Medical Advance Directive? Yes No Yes Yes Yes Yes -  Type of Paramedic of [place];Living will - Healthcare Power of [place];Living will [place];Living will - Living will;Healthcare Power of Attorney  Does patient want to make changes to medical advance directive? No - Guardian declined - - - - - -  Copy of [place] in Chart? No - copy requested - - - - No - copy requested -  Would patient  like information on creating a medical advance directive? - No - Patient declined - - - - -  Pre-existing out of facility DNR order (yellow form or pink MOST form) - - - - - - -    Tobacco Social History   Tobacco Use  Smoking Status Former Smoker  . Packs/day: 0.25  . Years: 35.00  . Pack years: 8.75  . Types: Cigarettes  . Quit date: 01/12/1978  . Years since quitting: 40.4  Smokeless Tobacco Current User  . Types: Chew     Ready to quit: Not Answered Counseling given: Not Answered   Clinical Intake: Pain : No/denies pain    Past Medical History:  Diagnosis Date  . Acute bronchitis 06/10/2015  . Allergic rhinitis   . Anemia   . Asthma   . Carotid stenosis    a. s/p Left CEA 2002;  b. Carotid US 3/16:  patent R CEA, L < 40%  . Chronic pain syndrome    Left shoulder, back  . Diverticulosis   . Dyslipidemia   . Epigastric pain 04/05/2016  . Esophageal stricture    Distal, benign  . GERD (gastroesophageal reflux disease)   . Hemorrhoids   . HTN (hypertension)    Negative renal duplex 07-22-11  . Hx of echocardiogram    a. Echo 10/11: mod LVH, EF 55-60%  . Pneumonia 07-2013  . Prostate cancer ([place]) 2000   Seed XRT  . Spondylosis, cervical, with myelopathy 11/26/2015  . Stroke [facility]) 8/01   right thalamic - on chronic Plavix/ASA  Past Surgical History:  Procedure Laterality Date  . APPENDECTOMY  1953  . CAROTID ENDARTERECTOMY Right 01/2000  . CATARACT EXTRACTION Bilateral   . CERVICAL DISC SURGERY  8/07  . INSERTION PROSTATE RADIATION SEED  2000  . [place] SURGERY  1990s  . NASAL SINUS SURGERY     x 3   Family History  Problem Relation Age of Onset  . Stroke Brother   . Stroke Sister   . Stroke Mother   . Hyperlipidemia Mother   . Hypertension Mother   . Lung disease Father   . Diabetes Son   . Gout Son   . Obesity Sister   . Alcohol abuse Brother   . Cancer Brother        lung, smoker   Social History   Socioeconomic History  . Marital  status: Married    Spouse name: Not on file  . Number of children: 1  . Years of education: 24  . Highest education level: Not on file  Occupational History  . Occupation: Retired  Scientific laboratory technician  . Financial resource strain: Not on file  . Food insecurity    Worry: Not on file    Inability: Not on file  . Transportation needs    Medical: Not on file    Non-medical: Not on file  Tobacco Use  . Smoking status: Former Smoker    Packs/day: 0.25    Years: 35.00    Pack years: 8.75    Types: Cigarettes    Quit date: 01/12/1978    Years since quitting: 40.4  . Smokeless tobacco: Current User    Types: Chew  Substance and Sexual Activity  . Alcohol use: No    Comment: Prior alcoholic, sober since 3825  . Drug use: No  . Sexual activity: Not Currently  Lifestyle  . Physical activity    Days per week: Not on file    Minutes per session: Not on file  . Stress: Not on file  Relationships  . Social Herbalist on phone: Not on file    Gets together: Not on file    Attends religious service: Not on file    Active member of club or organization: Not on file    Attends meetings of clubs or organizations: Not on file    Relationship status: Not on file  Other Topics Concern  . Not on file  Social History Narrative   Lives at home w/ his wife   Right-handed    Caffeine: drinks a lot of coffee per report    Outpatient Encounter Medications as of 07/01/2018  Medication Sig  . albuterol (PROVENTIL HFA;VENTOLIN HFA) 108 (90 Base) MCG/ACT inhaler Inhale 1-2 puffs into the lungs every 6 (six) hours as needed for wheezing or shortness of breath.  [name] Kitchen amLODipine (NORVASC) 5 MG tablet TAKE 1 TABLET BY MOUTH TWO TIMES DAILY (NEEDS OV)  . apixaban (ELIQUIS) 5 MG TABS tablet Take 1 tablet (5 mg total) by mouth 2 (two) times daily.  [name] Kitchen atorvastatin (LIPITOR) 40 MG tablet TAKE 1 TABLET BY MOUTH DAILY.  . cloNIDine (CATAPRES) 0.1 MG tablet TAKE 1 TABLET BY MOUTH THREE TO FOUR TIMES DAILY   . docusate sodium (COLACE) 100 MG capsule Take 100 mg by mouth 2 (two) times daily.   . fluticasone (FLOVENT HFA) 110 MCG/ACT inhaler Inhale 2 puffs into the lungs 2 (two) times daily.  [name] Kitchen gabapentin (NEURONTIN) 300 MG capsule TAKE 1 CAPSULE BY MOUTH 3 TIMES  DAILY  . metoCLOPramide (REGLAN) 5 MG tablet TAKE 1 TABLET BY MOUTH AT BEDTIME (Patient taking differently: TAKE 1 TABLET BY MOUTH AT BEDTIME as needed)  . metoprolol succinate (TOPROL XL) 25 MG 24 hr tablet Take 0.5 tablets (12.5 mg total) by mouth daily.  . minoxidil (LONITEN) 10 MG tablet TAKE 1/2-1 TABLET BY MOUTH ONCE A DAY (NEEDS OV)  . pantoprazole (PROTONIX) 40 MG tablet TAKE 1 TABLET (40 MG TOTAL) BY MOUTH DAILY BEFORE BREAKFAST.  [name] Kitchen polyethylene glycol (MIRALAX / GLYCOLAX) packet Take 17 g by mouth daily as needed for mild constipation. Mix in 8 oz liquid and drink  . polyvinyl alcohol (ARTIFICIAL TEARS) 1.4 % ophthalmic solution Place 1 drop into both eyes daily as needed for dry eyes.  . traMADol (ULTRAM) 50 MG tablet TAKE 1 TABLET BY MOUTH EVERY 8 HOURS AS NEEDED  . triamcinolone cream (KENALOG) 0.5 % Apply 1 application topically daily as needed (for skin irritation).   . [DISCONTINUED] traMADol (ULTRAM) 50 MG tablet TAKE 1 TABLET BY MOUTH EVERY 8 HOURS AS NEEDED   No facility-administered encounter medications on file as of 07/01/2018.     Activities of Daily Living In your present state of health, do you have any difficulty performing the following activities: 07/01/2018  Hearing? N  Vision? N  Difficulty concentrating or making decisions? N  Walking or climbing stairs? N  Dressing or bathing? N  Doing errands, shopping? N  Preparing Food and eating ? N  Using the Toilet? N  In the past six months, have you accidently leaked urine? N  Do you have problems with loss of bowel control? N  Managing your Medications? N  Managing your Finances? N  Housekeeping or managing your Housekeeping? N  Some recent data might be  hidden    Patient Care Team: [name], MD as PCP - General (Family Medicine) Fay Records, MD (Cardiology) Early, Arvilla Meres, MD (Vascular Surgery) Katy Apo, MD (Ophthalmology) Harriett Sine, MD as Consulting Physician (Dermatology) Gatha Mayer, MD as Consulting Physician (Gastroenterology)   Assessment:   This is a routine wellness examination for [place]. Physical assessment deferred to PCP.  Exercise Activities and Dietary recommendations Current Exercise Habits: The patient does not participate in regular exercise at present, Exercise limited by: None identified Diet (meal preparation, eat out, water intake, caffeinated beverages, dairy products, fruits and vegetables): well balanced, on average, 3 meals per day      Goals    . DIET - EAT MORE FRUITS AND VEGETABLES       Fall Risk Fall Risk  07/01/2018 12/01/2016 12/10/2014 11/30/2013  Falls in the past year? 0 No Yes Yes  Number falls in past yr: - - 1 1  Injury with Fall? - - No No  Risk for fall due to : - - Impaired balance/gait;History of fall(s) -  Follow up - - Falls prevention discussed -    Depression Screen PHQ 2/9 Scores 07/01/2018 12/01/2016 12/10/2014 11/30/2013  PHQ - 2 Score 0 0 0 1    Cognitive Function Ad8 score reviewed for issues:  Issues making decisions:no  Less interest in hobbies / activities:no  Repeats questions, stories (family complaining):no  Trouble using ordinary gadgets (microwave, computer, phone):no  Forgets the month or year: no  Mismanaging finances: no  Remembering appts:no  Daily problems with thinking and/or memory:no Ad8 score is=0        Immunization History  Administered Date(s) Administered  . Influenza Split 09/12/2012  .  Influenza Whole 11/04/2006, 10/11/2008, 09/24/2011  . Influenza, High Dose Seasonal PF 10/08/2015  . Influenza-Unspecified 10/05/2013, 10/13/2014  . Pneumococcal Conjugate-13 10/05/2013  . Pneumococcal Polysaccharide-23  01/13/2003, 12/10/2014  . Tetanus 12/12/2013  . Zoster 11/24/2011    Screening Tests Health Maintenance  Topic Date Due  . INFLUENZA VACCINE  08/13/2018  . TETANUS/TDAP  12/13/2023  . PNA vac Low Risk Adult  Completed       Plan:   See you next year!  Continue to eat heart healthy diet (full of fruits, vegetables, whole grains, lean protein, water--limit salt, fat, and sugar intake) and increase physical activity as tolerated.  Continue doing brain stimulating activities (puzzles, reading, adult coloring books, staying active) to keep memory sharp.    I have personally reviewed and noted the following in the patient's chart:   . Medical and social history . Use of alcohol, tobacco or illicit drugs  . Current medications and supplements . Functional ability and status . Nutritional status . Physical activity . Advanced directives . List of other physicians . Hospitalizations, surgeries, and ER visits in previous 12 months . Vitals . Screenings to include cognitive, depression, and falls . Referrals and appointments  In addition, I have reviewed and discussed with patient certain preventive protocols, quality metrics, and best practice recommendations. A written personalized care plan for preventive services as well as general preventive health recommendations were provided to patient.     Shela Nevin, [place]  07/01/2018  Medical screening examination/treatment was performed by qualified clinical staff member and as supervising physician I was immediately available for consultation/collaboration. I have reviewed documentation and agree with assessment and plan.  Penni Homans, MD

## 2018-07-01 ENCOUNTER — Ambulatory Visit (INDEPENDENT_AMBULATORY_CARE_PROVIDER_SITE_OTHER): Payer: PPO | Admitting: *Deleted

## 2018-07-01 ENCOUNTER — Encounter: Payer: Self-pay | Admitting: *Deleted

## 2018-07-01 ENCOUNTER — Other Ambulatory Visit: Payer: Self-pay

## 2018-07-01 DIAGNOSIS — Z0000 Encounter for general adult medical examination without abnormal findings: Secondary | ICD-10-CM | POA: Diagnosis not present

## 2018-07-01 MED FILL — traMADol HCL 50 MG TABS: 50 | 25 days supply | Qty: 75 | Fill #0

## 2018-07-01 NOTE — Patient Instructions (Signed)
See you next year!  Continue to eat heart healthy diet (full of fruits, vegetables, whole grains, lean protein, water--limit salt, fat, and sugar intake) and increase physical activity as tolerated.  Continue doing brain stimulating activities (puzzles, reading, adult coloring books, staying active) to keep memory sharp.    [name] , Thank you for taking time to come for your Medicare Wellness Visit. I appreciate your ongoing commitment to your health goals. Please review the following plan we discussed and let me know if I can assist you in the future.   These are the goals we discussed: Goals    . DIET - EAT MORE FRUITS AND VEGETABLES       This is a list of the screening recommended for you and due dates:  Health Maintenance  Topic Date Due  . Flu Shot  08/13/2018  . Tetanus Vaccine  12/13/2023  . Pneumonia vaccines  Completed    Health Maintenance After Age 52 After age 30, you are at a higher risk for certain long-term diseases and infections as well as injuries from falls. Falls are a major cause of broken bones and head injuries in people who are older than age 57. Getting regular preventive care can help to keep you healthy and well. Preventive care includes getting regular testing and making lifestyle changes as recommended by your health care provider. Talk with your health care provider about:  Which screenings and tests you should have. A screening is a test that checks for a disease when you have no symptoms.  A diet and exercise plan that is right for you. What should I know about screenings and tests to prevent falls? Screening and testing are the best ways to find a health problem early. Early diagnosis and treatment give you the best chance of managing medical conditions that are common after age 52. Certain conditions and lifestyle choices may make you more likely to have a fall. Your health care provider may recommend:  Regular vision checks. Poor vision and  conditions such as cataracts can make you more likely to have a fall. If you wear glasses, make sure to get your prescription updated if your vision changes.  Medicine review. Work with your health care provider to regularly review all of the medicines you are taking, including over-the-counter medicines. Ask your health care provider about any side effects that may make you more likely to have a fall. Tell your health care provider if any medicines that you take make you feel dizzy or sleepy.  Osteoporosis screening. Osteoporosis is a condition that causes the bones to get weaker. This can make the bones weak and cause them to break more easily.  Blood pressure screening. Blood pressure changes and medicines to control blood pressure can make you feel dizzy.  Strength and balance checks. Your health care provider may recommend certain tests to check your strength and balance while standing, walking, or changing positions.  Foot health exam. Foot pain and numbness, as well as not wearing proper footwear, can make you more likely to have a fall.  Depression screening. You may be more likely to have a fall if you have a fear of falling, feel emotionally low, or feel unable to do activities that you used to do.  Alcohol use screening. Using too much alcohol can affect your balance and may make you more likely to have a fall. What actions can I take to lower my risk of falls? General instructions  Talk with your health  care provider about your risks for falling. Tell your health care provider if: ? You fall. Be sure to tell your health care provider about all falls, even ones that seem minor. ? You feel dizzy, sleepy, or off-balance.  Take over-the-counter and prescription medicines only as told by your health care provider. These include any supplements.  Eat a healthy diet and maintain a healthy weight. A healthy diet includes low-fat dairy products, low-fat (lean) meats, and fiber from whole  grains, beans, and lots of fruits and vegetables. Home safety  Remove any tripping hazards, such as rugs, cords, and clutter.  Install safety equipment such as grab bars in bathrooms and safety rails on stairs.  Keep rooms and walkways well-lit. Activity   Follow a regular exercise program to stay fit. This will help you maintain your balance. Ask your health care provider what types of exercise are appropriate for you.  If you need a cane or walker, use it as recommended by your health care provider.  Wear supportive shoes that have nonskid soles. Lifestyle  Do not drink alcohol if your health care provider tells you not to drink.  If you drink alcohol, limit how much you have: ? 0-1 drink a day for women. ? 0-2 drinks a day for men.  Be aware of how much alcohol is in your drink. In the [place] one drink equals one typical bottle of beer (12 oz), one-half glass of wine (5 oz), or one shot of hard liquor (1 oz).  Do not use any products that contain nicotine or tobacco, such as cigarettes and e-cigarettes. If you need help quitting, ask your health care provider. Summary  Having a healthy lifestyle and getting preventive care can help to protect your health and wellness after age 18.  Screening and testing are the best way to find a health problem early and help you avoid having a fall. Early diagnosis and treatment give you the best chance for managing medical conditions that are more common for people who are older than age 71.  Falls are a major cause of broken bones and head injuries in people who are older than age 33. Take precautions to prevent a fall at home.  Work with your health care provider to learn what changes you can make to improve your health and wellness and to prevent falls. This information is not intended to replace advice given to you by your health care provider. Make sure you discuss any questions you have with your health care provider. Document  Released: 11/11/2016 Document Revised: 11/11/2016 Document Reviewed: 11/11/2016 Elsevier Interactive Patient Education  2019 [employer].

## 2018-07-01 NOTE — Telephone Encounter (Signed)
Covering for Dr. Charlett Blake.. request sent to Dr. Etter Sjogren.  Requesting:tramadol  Contract:yes UDS:low risk  Last OV:03/04/18 Next OV:07/12/18 Last Refill:06/08/18 #[id number] Database:   Please advise

## 2018-07-12 ENCOUNTER — Ambulatory Visit: Payer: PPO | Admitting: Family Medicine

## 2018-07-13 ENCOUNTER — Encounter: Payer: Self-pay | Admitting: Medical

## 2018-07-13 ENCOUNTER — Ambulatory Visit (INDEPENDENT_AMBULATORY_CARE_PROVIDER_SITE_OTHER): Payer: PPO | Admitting: Medical

## 2018-07-13 ENCOUNTER — Other Ambulatory Visit: Payer: Self-pay

## 2018-07-13 ENCOUNTER — Telehealth: Payer: Self-pay | Admitting: Family Medicine

## 2018-07-13 ENCOUNTER — Ambulatory Visit: Payer: PPO | Admitting: Medical

## 2018-07-13 VITALS — BP 148/44 | HR 58 | Temp 98.3°F | Resp 16 | Ht 71.0 in | Wt 158.8 lb

## 2018-07-13 DIAGNOSIS — I10 Essential (primary) hypertension: Secondary | ICD-10-CM

## 2018-07-13 DIAGNOSIS — L089 Local infection of the skin and subcutaneous tissue, unspecified: Secondary | ICD-10-CM | POA: Diagnosis not present

## 2018-07-13 MED ORDER — SULFAMETHOXAZOLE-TRIMETHOPRIM 800-160 MG PO TABS: 1.0000 | ORAL_TABLET | Freq: Two times a day (BID) | ORAL | 0 refills | Status: DC

## 2018-07-13 MED ORDER — MUPIROCIN 2 % EX OINT: TOPICAL_OINTMENT | CUTANEOUS | 0 refills | Status: DC

## 2018-07-13 MED FILL — SULFAMETHOXAZOLE-TMP DS TAB: 800-160 | 7 days supply | Qty: 14 | Fill #0

## 2018-07-13 MED FILL — MUPIROCIN 2% OINTMENT: 2 | 15 days supply | Qty: 22 | Fill #0

## 2018-07-13 NOTE — Progress Notes (Signed)
Subjective:    Patient ID: [name], male    DOB: 09/25/1929, 85 y.o.   MRN: [id number]  HPI  Pt in for follow some recent.  Pt has rt lateral malleolus small breakdown of skin that has been present for months. Pt states late in winter shoe would rub against area. Now he is wearing sandles. Pt occasionally will see some yellow dc.  Pt states in past had used antibiotic for few days but never finished the full course. When he sleeps he lays on that side and thinks rubs ankle on matress.   Pt bp is decent today 148/44. Wife has been keeping record of his bp. Pt has cardiologist who has been making modifications. Pt sometimes get bp 200/100. Pt cardiologist has advised him to use extra clonidine in event of severe high bp. Pt is on metoprol, norvasc and clonodine 0.1 tid. But will take extra tab clondien when bp spikes.   Review of Systems  Constitutional: Negative for chills, fatigue and fever.  Respiratory: Negative for chest tightness, shortness of breath and wheezing.   Cardiovascular: Negative for chest pain and palpitations.  Gastrointestinal: Negative for abdominal pain.  Genitourinary: Negative for dysuria, flank pain and frequency.  Musculoskeletal: Negative for back pain and neck pain.  Skin:       See hpi.  Neurological: Negative for dizziness and headaches.  Hematological: Negative for adenopathy. Does not bruise/bleed easily.  Psychiatric/Behavioral: Negative for behavioral problems. The patient is not nervous/anxious.     Past Medical History:  Diagnosis Date  . Acute bronchitis 06/10/2015  . Allergic rhinitis   . Anemia   . Asthma   . Carotid stenosis    a. s/p Left CEA 2002;  b. Carotid US 3/16:  patent R CEA, L < 40%  . Chronic pain syndrome    Left shoulder, back  . Diverticulosis   . Dyslipidemia   . Epigastric pain 04/05/2016  . Esophageal stricture    Distal, benign  . GERD (gastroesophageal reflux disease)   . Hemorrhoids   . HTN (hypertension)     Negative renal duplex 07-22-11  . Hx of echocardiogram    a. Echo 10/11: mod LVH, EF 55-60%  . Pneumonia 07-2013  . Prostate cancer ([place]) 2000   Seed XRT  . Spondylosis, cervical, with myelopathy 11/26/2015  . Stroke [facility]) 8/01   right thalamic - on chronic Plavix/ASA     Social History   Socioeconomic History  . Marital status: Married    Spouse name: Not on file  . Number of children: 1  . Years of education: 81  . Highest education level: Not on file  Occupational History  . Occupation: Retired  Scientific laboratory technician  . Financial resource strain: Not on file  . Food insecurity    Worry: Not on file    Inability: Not on file  . Transportation needs    Medical: Not on file    Non-medical: Not on file  Tobacco Use  . Smoking status: Former Smoker    Packs/day: 0.25    Years: 35.00    Pack years: 8.75    Types: Cigarettes    Quit date: 01/12/1978    Years since quitting: 40.5  . Smokeless tobacco: Current User    Types: Chew  Substance and Sexual Activity  . Alcohol use: No    Comment: Prior alcoholic, sober since 4098  . Drug use: No  . Sexual activity: Not Currently  Lifestyle  . Physical activity  Days per week: Not on file    Minutes per session: Not on file  . Stress: Not on file  Relationships  . Social Herbalist on phone: Not on file    Gets together: Not on file    Attends religious service: Not on file    Active member of club or organization: Not on file    Attends meetings of clubs or organizations: Not on file    Relationship status: Not on file  . Intimate partner violence    Fear of current or ex partner: Not on file    Emotionally abused: Not on file    Physically abused: Not on file    Forced sexual activity: Not on file  Other Topics Concern  . Not on file  Social History Narrative   Lives at home w/ his wife   Right-handed    Caffeine: drinks a lot of coffee per report    Past Surgical History:  Procedure Laterality Date  .  APPENDECTOMY  1953  . CAROTID ENDARTERECTOMY Right 01/2000  . CATARACT EXTRACTION Bilateral   . CERVICAL DISC SURGERY  8/07  . INSERTION PROSTATE RADIATION SEED  2000  . [place] SURGERY  1990s  . NASAL SINUS SURGERY     x 3    Family History  Problem Relation Age of Onset  . Stroke Brother   . Stroke Sister   . Stroke Mother   . Hyperlipidemia Mother   . Hypertension Mother   . Lung disease Father   . Diabetes Son   . Gout Son   . Obesity Sister   . Alcohol abuse Brother   . Cancer Brother        lung, smoker    Allergies  Allergen Reactions  . Diovan [Valsartan] Other (See Comments)    Elevated potassium hyperkalemia  . Hctz [Hydrochlorothiazide] Other (See Comments)    Swelling and dyspnea   . Hydralazine     Chest pain, GI issues, shortness of breath  . Metformin And Related Other (See Comments)    Unknown reaction  . Prednisone     Suicidal thoughts  . Spironolactone Swelling  . Codeine Nausea Only  . Oxycodone-Acetaminophen Nausea Only    Current Outpatient Medications on File Prior to Visit  Medication Sig Dispense Refill  . albuterol (PROVENTIL HFA;VENTOLIN HFA) 108 (90 Base) MCG/ACT inhaler Inhale 1-2 puffs into the lungs every 6 (six) hours as needed for wheezing or shortness of breath. 1 Inhaler 6  . amLODipine (NORVASC) 5 MG tablet TAKE 1 TABLET BY MOUTH TWO TIMES DAILY (NEEDS OV) 180 tablet 3  . apixaban (ELIQUIS) 5 MG TABS tablet Take 1 tablet (5 mg total) by mouth 2 (two) times daily. 180 tablet 3  . atorvastatin (LIPITOR) 40 MG tablet TAKE 1 TABLET BY MOUTH DAILY. 90 tablet 1  . cloNIDine (CATAPRES) 0.1 MG tablet TAKE 1 TABLET BY MOUTH THREE TO FOUR TIMES DAILY 120 tablet 2  . docusate sodium (COLACE) 100 MG capsule Take 100 mg by mouth 2 (two) times daily.     . fluticasone (FLOVENT HFA) 110 MCG/ACT inhaler Inhale 2 puffs into the lungs 2 (two) times daily. 1 Inhaler 0  . gabapentin (NEURONTIN) 300 MG capsule TAKE 1 CAPSULE BY MOUTH 3 TIMES  DAILY 270 capsule 1  . metoCLOPramide (REGLAN) 5 MG tablet TAKE 1 TABLET BY MOUTH AT BEDTIME (Patient taking differently: TAKE 1 TABLET BY MOUTH AT BEDTIME as needed) 30 tablet 5  .  metoprolol succinate (TOPROL XL) 25 MG 24 hr tablet Take 0.5 tablets (12.5 mg total) by mouth daily. 45 tablet 3  . minoxidil (LONITEN) 10 MG tablet TAKE 1/2-1 TABLET BY MOUTH ONCE A DAY (NEEDS OV) 90 tablet 3  . pantoprazole (PROTONIX) 40 MG tablet TAKE 1 TABLET (40 MG TOTAL) BY MOUTH DAILY BEFORE BREAKFAST. 90 tablet 0  . polyethylene glycol (MIRALAX / GLYCOLAX) packet Take 17 g by mouth daily as needed for mild constipation. Mix in 8 oz liquid and drink    . polyvinyl alcohol (ARTIFICIAL TEARS) 1.4 % ophthalmic solution Place 1 drop into both eyes daily as needed for dry eyes.    . traMADol (ULTRAM) 50 MG tablet TAKE 1 TABLET BY MOUTH EVERY 8 HOURS AS NEEDED 75 tablet 0  . triamcinolone cream (KENALOG) 0.5 % Apply 1 application topically daily as needed (for skin irritation).      No current facility-administered medications on file prior to visit.     BP (!) 148/44   Pulse (!) 58   Temp 98.3 F (36.8 C) (Oral)   Resp 16   Ht 5\' 11"  (1.803 m)   Wt 158 lb 12.8 oz (72 kg)   SpO2 98%   BMI 22.15 kg/m       Objective:   Physical Exam  General- No acute distress. Pleasant patient. Neck- Full range of motion, no jvd Lungs- Clear, even and unlabored. Heart- regular rate and rhythm. Neurologic- CNII- XII grossly intact. Rt lower ext- small 34mmw wide and 1 mm deep breakdown. Dry yellow dc present. Small area of surrounding pink skin. Mild indurated and mild tender.      Assessment & Plan:  You have wound right lateral malleolus region skin infection.  Minimal dry yellow discharge over that area.  I do want you to use the mupirocin  topical antibiotic twice daily.  You can leave the area open to air when using sandals but if you do wear regular shoe then would recommend using moleskin or other dressing to  reduce friction.  I did get wounds a culture sample today and that will be sent out tomorrow.  Prescribe Bactrim antibiotic as well.  For history of hypertension with quite high levels that time over 200/100 can continue with extra clonidine for spikes as you are cardiologist recommended.  You had talked with cardiologist office and they were going to consult with the pharmacist pharmacist and get back to me regarding options for better blood pressure control but you state never got a call back.  Currently blood pressure is adequately controlled.  Recommend that you call the cardiologist office and asked them what medication they would give.  I was considering low-dose losartan 25 mg daily.  You do however have some mild slight decreased GFR/decreased kidney function.  I would asked that you inquire them if they would be okay with using this.  If so have them please write the prescription.  Follow-up in 10 days or as needed.  Note next visit could be virtual visit.  Mackie Pai, PA-C

## 2018-07-13 NOTE — Telephone Encounter (Signed)
Pt dropped off copies of his [place]) Pt would like to have copies on pt's chart. Document put at provider tray at front office.

## 2018-07-13 NOTE — Patient Instructions (Addendum)
You have wound right lateral malleolus region skin infection.  Minimal dry yellow discharge over that area.  I do want you to use the mupirocin  topical antibiotic twice daily.  You can leave the area open to air when using sandals but if you do wear regular shoe then would recommend using moleskin or other dressing to reduce friction.  I did get wounds a culture sample today and that will be sent out tomorrow.  Prescribe Bactrim antibiotic as well.  For history of hypertension with quite high levels that time over 200/100 can continue with extra clonidine for spikes as you are cardiologist recommended.  You had talked with cardiologist office and they were going to consult with the pharmacist pharmacist and get back to me regarding options for better blood pressure control but you state never got a call back.  Currently blood pressure is adequately controlled.  Recommend that you call the cardiologist office and asked them what medication they would give.  I was considering low-dose losartan 25 mg daily.  You do however have some mild slight decreased GFR/decreased kidney function.  I would asked that you inquire them if they would be okay with using this.  If so have them please write the prescription.  Follow-up in 10 days or as needed.  Note next visit could be virtual visit.

## 2018-07-17 LAB — WOUND CULTURE
MICRO NUMBER:: 630566
SPECIMEN QUALITY:: ADEQUATE

## 2018-07-18 ENCOUNTER — Other Ambulatory Visit: Payer: Self-pay | Admitting: Internal Medicine

## 2018-07-18 MED FILL — PANTOPRAZOLE SOD DR 40 MG T: 40 | 90 days supply | Qty: 90 | Fill #0

## 2018-07-20 ENCOUNTER — Ambulatory Visit: Payer: Self-pay | Admitting: *Deleted

## 2018-07-20 NOTE — Telephone Encounter (Signed)
See result note.  

## 2018-07-24 MED FILL — ATORVASTATIN 40 MG TABLET: 40 | 90 days supply | Qty: 90 | Fill #1

## 2018-07-25 ENCOUNTER — Other Ambulatory Visit: Payer: Self-pay | Admitting: Family Medicine

## 2018-07-25 DIAGNOSIS — M549 Dorsalgia, unspecified: Secondary | ICD-10-CM

## 2018-07-27 MED FILL — traMADol HCL 50 MG TABS: 50 | 25 days supply | Qty: 75 | Fill #0

## 2018-07-27 NOTE — Telephone Encounter (Signed)
Requesting: Tramadol Contract:  UDS:01/13/2018, low risk, next screen 07/14/2018 Last OV: 07/13/2018 Next OV: 11/24/2018 Last Refill: 07/01/2018, #[id number] Database:   Please advise

## 2018-07-30 MED FILL — METOCLOPRAMIDE 5 MG TABLET: 5 | 30 days supply | Qty: 30 | Fill #4

## 2018-08-01 ENCOUNTER — Other Ambulatory Visit: Payer: Self-pay | Admitting: Internal Medicine

## 2018-08-01 MED ORDER — CLONIDINE HCL 0.1 MG PO TABS: ORAL_TABLET | ORAL | 10 refills | Status: DC

## 2018-08-01 MED FILL — CloNIDine HCL 0.1 MG TAB: 0.1 | 30 days supply | Qty: 120 | Fill #0

## 2018-08-03 ENCOUNTER — Telehealth: Payer: Self-pay | Admitting: Internal Medicine

## 2018-08-03 NOTE — Telephone Encounter (Signed)
Received call directly from operator from patient's wife who states patient's BP is very high. Reports many high readings over last few weeks. 199/103 mmHg 198/101 mmHg HR ranges from 58-62 bpm Per wife the PA at Dr. Frederik Pear office suggested patient start Losartan 25 mg but patient has not started the medication Wife states she has given patient clonidine 0.1 mg about 10 minutes ago I advised I will call them back in 15 minutes to check BP and will talk to Dr. Harrington Challenger' primary nurse, Caren Hazy, who is in the office today.  I waited 20 minutes and called back for patient's BP reading. Wife reports new electronic BP reading is 189/89 Manual BP check is 182/72 mmHg per wife I asked about the Rx for Losartan from [employer], [place] and wife states they were not given the Rx but it was offered as a suggestion. I advised that per Michalene, RN, patient took valsartan in the past but had to stop due to hyperkalemia.  I advised wife that per Caren Hazy, RN patient has a large number of medication intolerances from the past. She advised he take the Catapres four times daily as directed and be cautious in the heat due to patient likes to work outside. I advised that I will route to Dr. Harrington Challenger for additional advice.  Wife states they are going to [facility] for a few days and will be close to a pharmacy if Dr. Harrington Challenger prescribes a new medication. She thanked me for the call.

## 2018-08-03 NOTE — Telephone Encounter (Signed)
° °  Pt c/o BP issue: STAT if pt c/o blurred vision, one-sided weakness or slurred speech  1. What are your last 5 BP readings?  199/103  (wife given a catapress) 07/19: 1706/64 07/18: 186/79 07/15: 198/109  2. Are you having any other symptoms (ex. Dizziness, headache, blurred vision, passed out)?  Dizzy   3. What is your BP issue? High bp

## 2018-08-04 NOTE — Telephone Encounter (Signed)
I would recomm minoxidil 2.5 mg 2x per day   FOllow BP

## 2018-08-05 MED ORDER — MINOXIDIL 10 MG PO TABS: 10.0000 mg | ORAL_TABLET | Freq: Two times a day (BID) | ORAL | 6 refills | Status: DC

## 2018-08-05 NOTE — Telephone Encounter (Signed)
Increase to 1 tab bid Minoxidil

## 2018-08-05 NOTE — Telephone Encounter (Signed)
Called and spoke with patient's wife. Confirmed that pt was taking minoxidil 10 mg once a day. He will increase to 10 mg twice a day and monitor BP. Adv her to call back in a week with update on BP readings.  Adv to call mid week if numbers seem to not improve at all or worsen. She verbalizes understanding and agreement with this plan.  [place] beach and not sure when they are returning to [place].

## 2018-08-05 NOTE — Telephone Encounter (Signed)
Patient is taking minoxidil 10 mg -- 1/2 to 1 whole tablet daily. Will route to Dr. Harrington Challenger to clarify.

## 2018-08-15 ENCOUNTER — Telehealth: Payer: Self-pay | Admitting: Internal Medicine

## 2018-08-15 NOTE — Telephone Encounter (Signed)
Follow Up:   She was  supposed to asked for [facility] and if she is not here, asked for ttriage nurse.  This is concerning his blood pressure readings.IPlease call today, if possible.

## 2018-08-15 NOTE — Telephone Encounter (Signed)
Returned wife's call and she was instructed to report pt's bp readings after increasing Minoxidil to bid. Per wife:     7/25   161/78    HR  59    7/26    176/102        69 7/26 later 176/67 7/27  135/62     HR 56 7/27 later 193/68  HR  66 7/28  152/79 R 68  Later in day 193/63  HR 83 7/30  176/68  HR  57  7/31  186/74  9:30 pm   1130 p\m 165/65  HR 56 8/1  161/83  HR  63    Later 159/62  HR  60 8/2  170/80  10 pm HR 61 8/3 159/66  HR 60 430 today in left in right 162/91  HR 54  Wife aware that we will send this to Dr. Harrington Challenger and she will have Michalene or triage call them with recommendations, probably, tomorrow, since it was already 5:00.  She was very understandable and thanked me for calling her back.

## 2018-08-16 MED ORDER — MINOXIDIL 10 MG PO TABS: 10.0000 mg | ORAL_TABLET | Freq: Three times a day (TID) | ORAL | 0 refills | Status: DC

## 2018-08-16 NOTE — Telephone Encounter (Signed)
I spoke to the patient and informed her that Dr Harrington Challenger would like for him to increase his Minoxidil 10 mg tid.

## 2018-08-16 NOTE — Telephone Encounter (Signed)
I would recomm he increase minoxidil to 10 mg 3x per day

## 2018-08-18 ENCOUNTER — Telehealth: Payer: Self-pay | Admitting: Family Medicine

## 2018-08-18 DIAGNOSIS — M549 Dorsalgia, unspecified: Secondary | ICD-10-CM

## 2018-08-19 ENCOUNTER — Other Ambulatory Visit: Payer: Self-pay | Admitting: Family Medicine

## 2018-08-19 DIAGNOSIS — M549 Dorsalgia, unspecified: Secondary | ICD-10-CM

## 2018-08-19 MED ORDER — TRAMADOL HCL 50 MG PO TABS: 50.0000 mg | ORAL_TABLET | Freq: Three times a day (TID) | ORAL | 0 refills | Status: DC | PRN

## 2018-08-19 MED FILL — traMADol HCL 50 MG TABS: 50 | 25 days supply | Qty: 75 | Fill #0

## 2018-08-19 NOTE — Telephone Encounter (Signed)
Please advise 

## 2018-08-19 NOTE — Telephone Encounter (Signed)
I have sent the Tramadol to the out of town pharmacy please cancel the local prescription

## 2018-08-19 NOTE — Telephone Encounter (Signed)
Requesting:tramadol Contract:yes UDS:n/a Last OV:01/11/18 Next OV:11/24/2018 Last Refill:07/27/18  #[id number] Database:   Please advise

## 2018-08-19 NOTE — Telephone Encounter (Signed)
[name] is calling stating the Tramadol prescription is currently at [facility], but they are still out of town and need tramadol  to be sent to   [facility] [phone]  [address]   Please advise

## 2018-08-25 NOTE — Telephone Encounter (Signed)
Done

## 2018-08-27 ENCOUNTER — Other Ambulatory Visit: Payer: Self-pay | Admitting: Family Medicine

## 2018-08-27 MED FILL — AMLODIPINE BESYLATE 5 MG TA: 5 | 90 days supply | Qty: 180 | Fill #1

## 2018-08-30 ENCOUNTER — Other Ambulatory Visit: Payer: Self-pay

## 2018-08-30 ENCOUNTER — Ambulatory Visit (INDEPENDENT_AMBULATORY_CARE_PROVIDER_SITE_OTHER): Payer: PPO | Admitting: Medical

## 2018-08-30 ENCOUNTER — Encounter: Payer: Self-pay | Admitting: Medical

## 2018-08-30 VITALS — BP 172/80 | HR 68 | Temp 97.5°F | Resp 17 | Ht 71.0 in | Wt 165.0 lb

## 2018-08-30 DIAGNOSIS — L089 Local infection of the skin and subcutaneous tissue, unspecified: Secondary | ICD-10-CM | POA: Diagnosis not present

## 2018-08-30 DIAGNOSIS — S81801D Unspecified open wound, right lower leg, subsequent encounter: Secondary | ICD-10-CM | POA: Diagnosis not present

## 2018-08-30 DIAGNOSIS — T148XXA Other injury of unspecified body region, initial encounter: Secondary | ICD-10-CM | POA: Diagnosis not present

## 2018-08-30 MED FILL — GABAPENTIN 300 MG CAPSULE: 300 | 90 days supply | Qty: 270 | Fill #0

## 2018-08-30 NOTE — Progress Notes (Addendum)
   Subjective:    Patient ID: [name], male    DOB: 1930-01-08, 85 y.o.   MRN: [id number]  HPI Virtual Visit via Video Note  I connected with [name] on 08/30/18 at  2:40 PM EDT by a video enabled telemedicine application and verified that I am speaking with the correct person using two identifiers.  Location: Patient: home Provider: office   I discussed the limitations of evaluation and management by telemedicine and the availability of in person appointments. The patient expressed understanding and agreed to proceed.  Vitals not taken today.  History of Present Illness:   Pt daughter was over and she saw small tiny area of break down mid tibia present for 3 days. Pt also has rt lateral malleolus wound still present despite tx 6 weeks.   Observations/Objective: General-no acute distress, pleasant, oriented. Lungs- on inspection lungs appear unlabored. Neck- no tracheal deviation or jvd on inspection. Neuro- gross motor function appears intact.  Skin- left mid-distal 1/3 pretibial area 6 mm small wound but a lot of serosanginous drainage present. No obvious bright redness around area.Daughter who is RN does not think area is warm.  Rt lateral ankle-non healing ulcer still present.(I thinks looks larger than before but inspected via video)    Assessment and Plan:   Follow Up Instructions:    I discussed the assessment and treatment plan with the patient. The patient was provided an opportunity to ask questions and all were answered. The patient agreed with the plan and demonstrated an understanding of the instructions.   The patient was advised to call back or seek an in-person evaluation if the symptoms worsen or if the condition fails to improve as anticipated.  I provided 25 minutes of non-face-to-face time during this encounter.   Mackie Pai, PA-C    Review of Systems  Constitutional: Negative for chills, diaphoresis, fatigue and fever.   Respiratory: Negative for chest tightness, shortness of breath and wheezing.   Cardiovascular: Negative for palpitations.  Gastrointestinal: Negative for abdominal pain.  Skin:       See hpi       Objective:   Physical Exam        Assessment & Plan:

## 2018-08-30 NOTE — Patient Instructions (Signed)
Via video inspection, you do appear to have small wound left pretibial area that is draining.  Some concern for early wound infection.  I talked with reception staff today and it is okay for you to come in for a quick wound culture of the left pretibial wound.  We will send in Bactrim DS antibiotic but want you to start that tomorrow after wound culture is done.  Also will prescribe mupirocin topical antibiotic which you can apply directly to the small wound area.  You have chronic small ulcer/wound right ankle area.  I saw you at the beginning of July and wanted you to follow-up in 10 days.  Since that area is persisting will refer you to wound care clinic.  Your prior culture of that wound site did not show any particular bacteria except normal skin flora.  Follow-up in 10 to 11 days or as needed.

## 2018-08-31 DIAGNOSIS — L089 Local infection of the skin and subcutaneous tissue, unspecified: Secondary | ICD-10-CM | POA: Diagnosis not present

## 2018-08-31 DIAGNOSIS — T148XXA Other injury of unspecified body region, initial encounter: Secondary | ICD-10-CM | POA: Diagnosis not present

## 2018-08-31 MED ORDER — SULFAMETHOXAZOLE-TRIMETHOPRIM 800-160 MG PO TABS: 1.0000 | ORAL_TABLET | Freq: Two times a day (BID) | ORAL | 0 refills | Status: DC

## 2018-08-31 MED FILL — SULFAMETHOXAZOLE-TMP DS TAB: 800-160 | 7 days supply | Qty: 14 | Fill #0

## 2018-08-31 NOTE — Addendum Note (Signed)
Addended by: Hinton Dyer on: 08/31/2018 01:12 PM   Modules accepted: Orders

## 2018-08-31 NOTE — Addendum Note (Signed)
Addended by: Anabel Halon on: 08/31/2018 11:06 AM   Modules accepted: Orders

## 2018-09-02 ENCOUNTER — Telehealth: Payer: Self-pay | Admitting: *Deleted

## 2018-09-02 NOTE — Telephone Encounter (Signed)
Copied from [place]. Topic: Quick Communication - Lab Results (Clinic Use ONLY) >> Sep 02, 2018  4:05 PM Pauline Good wrote: Pt's need to know the result of his culture that was done Wednesday. Please call

## 2018-09-02 NOTE — Telephone Encounter (Signed)
Advised patient that results are not back yet.  We will call on Monday if they are back.

## 2018-09-03 LAB — WOUND CULTURE
MICRO NUMBER:: 788792
SPECIMEN QUALITY:: ADEQUATE

## 2018-09-06 ENCOUNTER — Telehealth: Payer: Self-pay | Admitting: Internal Medicine

## 2018-09-06 MED FILL — MINOXIDIL 10 MG TABLET: 10 | 90 days supply | Qty: 90 | Fill #1

## 2018-09-06 NOTE — Telephone Encounter (Signed)
I spoke to the patient's wife [name]) who called because the patient is SOB, wheezing and has developed swelling in the legs/feet the past few days.  His BP has been 180/60.  I scheduled a visit with Sharee Pimple on 8/26 @ 9 am.

## 2018-09-06 NOTE — Telephone Encounter (Signed)
New Message   Pt c/o Shortness Of Breath: STAT if SOB developed within the last 24 hours or pt is noticeably SOB on the phone  1. Are you currently SOB (can you hear that pt is SOB on the phone)? Spoke with patients wife who states that he is sob.   2. How long have you been experiencing SOB? Since yesterday   3. Are you SOB when sitting or when up moving around? When he moving around  4. Are you currently experiencing any other symptoms? No     Patients wife states that his BP is 178/60.

## 2018-09-06 NOTE — Progress Notes (Signed)
Cardiology Office Note   Date:  09/07/2018   ID:  [name], DOB 10-12-1929, MRN [id number]  PCP:  [name], MD  Cardiologist:  Michel Bickers, MD   Chief Complaint  Patient presents with  . Shortness of Breath    History of Present Illness: [name] is a 85 y.o. male with a history of longstanding, difficult to control hypertension recently started on minoxidil, also with a history of carotid stenosis s/p left CEA 2002, HLD, GERD, esophageal stricture, prostate cancer, stroke on Plavix/ASA and paroxysmal atrial fibrillation who presents for follow-up of shortness of breath and LE swelling.  The patient was initially diagnosed with atrial fibrillation in the ER2/13/20in the setting of pneumoniaafter presenting with symptoms of fever and chills. ECG at the ER showed afib with RVR HR 125.He was asymptomatic. Of note, his HR was in the normal range at his PCP office the day prior. He was started on metoprolol and Eliquis, his ASA and Plavix were stopped.  Seen in afib clinic 02/25/2018. Continued Eliquis and Toprol XL.   He was last seen by Dr. Harrington Challenger on 05/27/2018 symptoms of shortness of breath, wheezing and lower extremity swelling.  Patient and wife who were at the appointment were noted to be difficult to follow historians. Swelling and wheezing were reported as labile.  He has known reactive airway disease with asthma. At his last appointment his minoxidil was backed down to daily dosing. Per chart review, he contacted the office once again on 08/04/2018 in which his minoxidil was decreased to 2.5 mg twice per day. On 08/16/2018 Dr. Harrington Challenger recommended he increase minoxidil to one tablet given persistently elevated BP's at home.   His last echocardiogram was 03/28/2018 that showed a normal LV function with mild diastolic dysfunction and LVH.  There was no valvular disease noted.   He presents today with his wife reports a 2-day history of increased shortness of breath and  LE swelling.  He denies anginal symptoms, he does describe orthopnea symptoms with sleeping on 2+ pillows in the evenings.  Wife states that he has been wheezing.  Patient states his shortness of breath is responsive to home inhalers.  He does have known restrictive lung disease.  He denies diet indiscretions.  He denies fever, chills, nausea, vomiting or recent cough.  Has a known history of uncontrolled hypertension however BP is stable today.  Also a recent diagnosis of atrial fibrillation but he is in a regular rhythm on my exam.  Past Medical History:  Diagnosis Date  . Acute bronchitis 06/10/2015  . Allergic rhinitis   . Anemia   . Asthma   . Carotid stenosis    a. s/p Left CEA 2002;  b. Carotid US 3/16:  patent R CEA, L < 40%  . Chronic pain syndrome    Left shoulder, back  . Diverticulosis   . Dyslipidemia   . Epigastric pain 04/05/2016  . Esophageal stricture    Distal, benign  . GERD (gastroesophageal reflux disease)   . Hemorrhoids   . HTN (hypertension)    Negative renal duplex 07-22-11  . Hx of echocardiogram    a. Echo 10/11: mod LVH, EF 55-60%  . Pneumonia 07-2013  . Prostate cancer ([place]) 2000   Seed XRT  . Spondylosis, cervical, with myelopathy 11/26/2015  . Stroke [facility]) 8/01   right thalamic - on chronic Plavix/ASA    Past Surgical History:  Procedure Laterality Date  . APPENDECTOMY  1953  .  CAROTID ENDARTERECTOMY Right 01/2000  . CATARACT EXTRACTION Bilateral   . CERVICAL DISC SURGERY  8/07  . INSERTION PROSTATE RADIATION SEED  2000  . [place] SURGERY  1990s  . NASAL SINUS SURGERY     x 3     Current Outpatient Medications  Medication Sig Dispense Refill  . albuterol (PROVENTIL HFA;VENTOLIN HFA) 108 (90 Base) MCG/ACT inhaler Inhale 1-2 puffs into the lungs every 6 (six) hours as needed for wheezing or shortness of breath. 1 Inhaler 6  . amLODipine (NORVASC) 5 MG tablet TAKE 1 TABLET BY MOUTH TWO TIMES DAILY (NEEDS OV) 180 tablet 3  . apixaban  (ELIQUIS) 5 MG TABS tablet Take 1 tablet (5 mg total) by mouth 2 (two) times daily. 180 tablet 3  . atorvastatin (LIPITOR) 40 MG tablet TAKE 1 TABLET BY MOUTH DAILY. 90 tablet 1  . cloNIDine (CATAPRES) 0.1 MG tablet TAKE 1 TABLET BY MOUTH THREE TO FOUR TIMES DAILY 120 tablet 10  . docusate sodium (COLACE) 100 MG capsule Take 100 mg by mouth 2 (two) times daily.     . fluticasone (FLOVENT HFA) 110 MCG/ACT inhaler Inhale 2 puffs into the lungs 2 (two) times daily. 1 Inhaler 0  . gabapentin (NEURONTIN) 300 MG capsule TAKE 1 CAPSULE BY MOUTH 3 TIMES DAILY 270 capsule 1  . metoCLOPramide (REGLAN) 5 MG tablet TAKE 1 TABLET BY MOUTH AT BEDTIME (Patient taking differently: TAKE 1 TABLET BY MOUTH AT BEDTIME as needed) 30 tablet 5  . metoprolol succinate (TOPROL XL) 25 MG 24 hr tablet Take 0.5 tablets (12.5 mg total) by mouth daily. 45 tablet 3  . minoxidil (LONITEN) 10 MG tablet Take 10 mg by mouth 2 (two) times daily.    . mupirocin ointment (BACTROBAN) 2 % Apply thin film twice a day 22 g 0  . pantoprazole (PROTONIX) 40 MG tablet TAKE 1 TABLET (40 MG TOTAL) BY MOUTH DAILY BEFORE BREAKFAST. 90 tablet 0  . polyethylene glycol (MIRALAX / GLYCOLAX) packet Take 17 g by mouth daily as needed for mild constipation. Mix in 8 oz liquid and drink    . polyvinyl alcohol (ARTIFICIAL TEARS) 1.4 % ophthalmic solution Place 1 drop into both eyes daily as needed for dry eyes.    [name] Kitchen sulfamethoxazole-trimethoprim (BACTRIM DS) 800-160 MG tablet Take 1 tablet by mouth 2 (two) times daily. 14 tablet 0  . traMADol (ULTRAM) 50 MG tablet Take 1 tablet (50 mg total) by mouth every 8 (eight) hours as needed. 75 tablet 0  . triamcinolone cream (KENALOG) 0.5 % Apply 1 application topically daily as needed (for skin irritation).     . furosemide (LASIX) 20 MG tablet Take 1 tablet (20 mg total) by mouth daily. 30 tablet 1  . potassium chloride (K-DUR) 10 MEQ tablet Take 1 tablet (10 mEq total) by mouth daily. 30 tablet 1   No current  facility-administered medications for this visit.     Allergies:   Diovan [valsartan], Hctz [hydrochlorothiazide], Hydralazine, Metformin and related, Prednisone, Spironolactone, Codeine, and Oxycodone-acetaminophen    Social History:  The patient  reports that he quit smoking about 40 years ago. His smoking use included cigarettes. He has a 8.75 pack-year smoking history. His smokeless tobacco use includes chew. He reports that he does not drink alcohol or use drugs.   Family History:  The patient's family history includes Alcohol abuse in his brother; Cancer in his brother; Diabetes in his son; Gout in his son; Hyperlipidemia in his mother; Hypertension in his mother;  Lung disease in his father; Obesity in his sister; Stroke in his brother, mother, and sister.    ROS:  Please see the history of present illness.   Otherwise, review of systems are positive for none.   All other systems are reviewed and negative.    PHYSICAL EXAM: VS:  BP 140/60   Pulse 67   Ht 5\' 11"  (1.803 m)   Wt 160 lb 12.8 oz (72.9 kg)   SpO2 93%   BMI 22.43 kg/m  , BMI Body mass index is 22.43 kg/m.   General: Well developed, well nourished, NAD Neck: Negative for carotid bruits. No JVD Lungs:Clear to ausculation bilaterally. No wheezes, rales, or rhonchi. Breathing is unlabored. Cardiovascular: RRR with S1 S2. No murmur Extremities: 1+ BLE edema.  Redness noted on bilateral lower extremities. DP pulses 1+ bilaterally Neuro: Alert and oriented. No focal deficits. No facial asymmetry. MAE spontaneously. Psych: Responds to questions appropriately with normal affect.     EKG:  EKG is not ordered today.   Recent Labs: 01/13/2018: TSH 1.94 02/24/2018: ALT 11 03/15/2018: BUN 20; Creatinine, Ser 1.01; Hemoglobin 11.3; Platelets 375; Potassium 5.1; Sodium 140    Lipid Panel    Component Value Date/Time   CHOL 114 01/13/2018 1410   TRIG 85.0 01/13/2018 1410   HDL 46.50 01/13/2018 1410   CHOLHDL 2 01/13/2018  1410   VLDL 17.0 01/13/2018 1410   LDLCALC 50 01/13/2018 1410     Wt Readings from Last 3 Encounters:  09/07/18 160 lb 12.8 oz (72.9 kg)  08/30/18 165 lb (74.8 kg)  07/13/18 158 lb 12.8 oz (72 kg)     Other studies Reviewed: Additional studies/ records that were reviewed today include:   Echocardiogram 03/28/2018:   1. The left ventricle has normal systolic function with an ejection fraction of 60-65%. The cavity size was normal. There is moderately increased left ventricular wall thickness. Left ventricular diastolic Doppler parameters are consistent with impaired  relaxation.  2. The right ventricle has normal systolic function. The cavity was normal.  3. Trivial pericardial effusion is present.  4. There is mild mitral annular calcification present.  5. The aortic valve is tricuspid Mild thickening of the aortic valve no stenosis of the aortic valve.  6. Normal LV function; mild diastolic dysfunction; moderate LVH.  ASSESSMENT AND PLAN:  1.  Shortness of breath/LE swelling: -We will repeat echocardiogram for reassessment of LV and diastolic function>> was normal 03/28/2018 as above -Given longstanding uncontrolled hypertension, there is mild concern for LV dysfunction -Obtain BNP today -We will start low-dose Lasix 20 mg daily with K supplementation at 20 mEq daily -We will follow in 2 weeks for reassessment -Denies anginal symptoms  2.  PAF: -Maintaining sinus rhythm -Continue Eliquis 5 mg twice daily -No signs of acute bleeding in stool or urine -CHA2DS2VASc = 6  3.  HTN: -Controlled today at 140/60 -Continue amlodipine 5, clonidine 0.1 mg 3-4 times daily, Toprol 12.5 mg daily, -Reports only taking minoxidil 10 mg twice daily as opposed to 3 times daily as stated by Dr. Harrington Challenger.   -If testing comes back benign, will need hypertension clinic referral -May need to uptitrate amlodipine and Toprol and discontinue minoxidil in the future  4.  History of restrictive lung  disease: -Clear who follows his lung disease -On home albuterol, Flovent>>> reports relief with shortness of breath with inhalers -If cardiac work-up is benign, would consider referral to pulmonary medicine for PFTs -No wheezing on exam today  4.  HLD: -Continue statin  5.  Chronic kidney disease stage III: -Creatinine, 1.01 on 03/15/2018 -Repeat today    Current medicines are reviewed at length with the patient today.  The patient does not have concerns regarding medicines.  The following changes have been made: Lasix 20 mg daily and K-Dur 20 milliequivalent daily to regimen  Labs/ tests ordered today include: BNP, BMET  Orders Placed This Encounter  Procedures  . Basic metabolic panel  . Pro b natriuretic peptide (BNP)  . ECHOCARDIOGRAM COMPLETE    Disposition:   FU with Dr. Harrington Challenger or at in 2 weeks   Signed, Kathyrn Drown, NP  09/07/2018 9:43 AM    [address], [place], [place]  [address] Phone: [phone]; Fax: [phone]

## 2018-09-07 ENCOUNTER — Other Ambulatory Visit: Payer: Self-pay

## 2018-09-07 ENCOUNTER — Ambulatory Visit (INDEPENDENT_AMBULATORY_CARE_PROVIDER_SITE_OTHER): Payer: PPO | Admitting: Cardiology

## 2018-09-07 ENCOUNTER — Encounter: Payer: Self-pay | Admitting: Cardiology

## 2018-09-07 VITALS — BP 140/60 | HR 67 | Ht 71.0 in | Wt 160.8 lb

## 2018-09-07 DIAGNOSIS — I10 Essential (primary) hypertension: Secondary | ICD-10-CM | POA: Diagnosis not present

## 2018-09-07 DIAGNOSIS — R0602 Shortness of breath: Secondary | ICD-10-CM

## 2018-09-07 DIAGNOSIS — R609 Edema, unspecified: Secondary | ICD-10-CM | POA: Diagnosis not present

## 2018-09-07 DIAGNOSIS — I4891 Unspecified atrial fibrillation: Secondary | ICD-10-CM | POA: Diagnosis not present

## 2018-09-07 DIAGNOSIS — E782 Mixed hyperlipidemia: Secondary | ICD-10-CM | POA: Diagnosis not present

## 2018-09-07 LAB — PRO B NATRIURETIC PEPTIDE: NT-Pro BNP: 1352 pg/mL — ABNORMAL HIGH (ref 0–486)

## 2018-09-07 LAB — BASIC METABOLIC PANEL
BUN/Creatinine Ratio: 12 (ref 10–24)
BUN: 18 mg/dL (ref 8–27)
CO2: 21 mmol/L (ref 20–29)
Calcium: 8.7 mg/dL (ref 8.6–10.2)
Chloride: 104 mmol/L (ref 96–106)
Creatinine, Ser: 1.46 mg/dL — ABNORMAL HIGH (ref 0.76–1.27)
GFR calc Af Amer: 49 mL/min/{1.73_m2} — ABNORMAL LOW (ref >59)
GFR calc non Af Amer: 43 mL/min/{1.73_m2} — ABNORMAL LOW (ref >59)
Glucose: 185 mg/dL — ABNORMAL HIGH (ref 65–99)
Potassium: 4.8 mmol/L (ref 3.5–5.2)
Sodium: 138 mmol/L (ref 134–144)

## 2018-09-07 MED ORDER — FUROSEMIDE 20 MG PO TABS: 20.0000 mg | ORAL_TABLET | Freq: Every day | ORAL | 1 refills | Status: DC

## 2018-09-07 MED ORDER — POTASSIUM CHLORIDE ER 10 MEQ PO TBCR: 10.0000 meq | EXTENDED_RELEASE_TABLET | Freq: Every day | ORAL | 1 refills | Status: DC

## 2018-09-07 MED FILL — FUROSEMIDE 20 MG TABS: 20 | 30 days supply | Qty: 30 | Fill #0

## 2018-09-07 MED FILL — POTASSIUM CHL ER M10 TABLET: 10 | 30 days supply | Qty: 30 | Fill #0

## 2018-09-07 NOTE — Patient Instructions (Addendum)
Medication Instructions:  Your physician has recommended you make the following change in your medication:  1.  START Lasix 20 mg taking 1 tablet daily 2.  START K-dur 10 meq taking 1 tablet daily   If you need a refill on your cardiac medications before your next appointment, please call your pharmacy.   Lab work: TODAY:  BMET & PRO BNP If you have labs (blood work) drawn today and your tests are completely normal, you will receive your results only by: [name] Kitchen MyChart Message (if you have MyChart) OR . A paper copy in the mail If you have any lab test that is abnormal or we need to change your treatment, we will call you to review the results.  Testing/Procedures: Your physician has requested that you have an echocardiogram 09/08/2018 at 10:35. Please arrive 15 minutes early to this test.  Echocardiography is a painless test that uses sound waves to create images of your heart. It provides your doctor with information about the size and shape of your heart and how well your heart's chambers and valves are working. This procedure takes approximately one hour. There are no restrictions for this procedure.    Follow-Up: At [facility], you and your health needs are our priority.  As part of our continuing mission to provide you with exceptional heart care, we have created designated Provider Care Teams.  These Care Teams include your primary Cardiologist (physician) and Advanced Practice Providers (APPs -  Physician Assistants and Nurse Practitioners) who all work together to provide you with the care you need, when you need it. . You will need a follow up appointment in: 09/21/2018 at 8:30 with Kathyrn Drown, NP at 8:30 a.m.  Please arrive 15 minutes early to this appointment.  Any Other Special Instructions Will Be Listed Below (If Applicable).   Echocardiogram An echocardiogram is a procedure that uses painless sound waves (ultrasound) to produce an image of the heart. Images from an  echocardiogram can provide important information about:  Signs of coronary artery disease (CAD).  Aneurysm detection. An aneurysm is a weak or damaged part of an artery wall that bulges out from the normal force of blood pumping through the body.  Heart size and shape. Changes in the size or shape of the heart can be associated with certain conditions, including heart failure, aneurysm, and CAD.  Heart muscle function.  Heart valve function.  Signs of a past heart attack.  Fluid buildup around the heart.  Thickening of the heart muscle.  A tumor or infectious growth around the heart valves. Tell a health care provider about:  Any allergies you have.  All medicines you are taking, including vitamins, herbs, eye drops, creams, and over-the-counter medicines.  Any blood disorders you have.  Any surgeries you have had.  Any medical conditions you have.  Whether you are pregnant or may be pregnant. What are the risks? Generally, this is a safe procedure. However, problems may occur, including:  Allergic reaction to dye (contrast) that may be used during the procedure. What happens before the procedure? No specific preparation is needed. You may eat and drink normally. What happens during the procedure?   An IV tube may be inserted into one of your veins.  You may receive contrast through this tube. A contrast is an injection that improves the quality of the pictures from your heart.  A gel will be applied to your chest.  A wand-like tool (transducer) will be moved over your chest. The  gel will help to transmit the sound waves from the transducer.  The sound waves will harmlessly bounce off of your heart to allow the heart images to be captured in real-time motion. The images will be recorded on a computer. The procedure may vary among health care providers and hospitals. What happens after the procedure?  You may return to your normal, everyday life, including diet,  activities, and medicines, unless your health care provider tells you not to do that. Summary  An echocardiogram is a procedure that uses painless sound waves (ultrasound) to produce an image of the heart.  Images from an echocardiogram can provide important information about the size and shape of your heart, heart muscle function, heart valve function, and fluid buildup around your heart.  You do not need to do anything to prepare before this procedure. You may eat and drink normally.  After the echocardiogram is completed, you may return to your normal, everyday life, unless your health care provider tells you not to do that. This information is not intended to replace advice given to you by your health care provider. Make sure you discuss any questions you have with your health care provider. Document Released: 12/27/1999 Document Revised: 04/21/2018 Document Reviewed: 02/01/2016 Elsevier Patient Education  2020 [employer].

## 2018-09-08 ENCOUNTER — Telehealth: Payer: Self-pay

## 2018-09-08 ENCOUNTER — Ambulatory Visit (HOSPITAL_COMMUNITY): Payer: PPO | Attending: Cardiology

## 2018-09-08 DIAGNOSIS — I4891 Unspecified atrial fibrillation: Secondary | ICD-10-CM

## 2018-09-08 DIAGNOSIS — E782 Mixed hyperlipidemia: Secondary | ICD-10-CM | POA: Diagnosis not present

## 2018-09-08 DIAGNOSIS — R0602 Shortness of breath: Secondary | ICD-10-CM | POA: Diagnosis not present

## 2018-09-08 DIAGNOSIS — R609 Edema, unspecified: Secondary | ICD-10-CM | POA: Diagnosis not present

## 2018-09-08 DIAGNOSIS — I10 Essential (primary) hypertension: Secondary | ICD-10-CM | POA: Diagnosis not present

## 2018-09-08 NOTE — Telephone Encounter (Signed)
Called pt regarding his echo and lab results. Attempted to schedule f/u lab work with pt to assess how Lasix is working. Pt reports that he never started the Lasix because he has been feeling better.

## 2018-09-10 MED FILL — ELIQUIS 5 MG TABLET: 5 | 30 days supply | Qty: 60 | Fill #2

## 2018-09-14 MED FILL — CloNIDine HCL 0.1 MG TAB: 0.1 | 30 days supply | Qty: 120 | Fill #1

## 2018-09-14 MED FILL — METOPROLOL SUCCINATE ER 25: 25 | 90 days supply | Qty: 45 | Fill #2

## 2018-09-14 NOTE — Progress Notes (Deleted)
Cardiology Office Note   Date:  09/14/2018   ID:  [name], DOB 07-05-1929, MRN [id number]  PCP:  [name], MD  Cardiologist: Dr. [name], MD   No chief complaint on file.   History of Present Illness: [name] is a 85 y.o. male with a history of longstanding, difficult to control hypertension recently started on minoxidil, also with a history of carotid stenosis s/p left CEA 2002, HLD, GERD, esophageal stricture, prostate cancer, stroke on Plavix/ASA and paroxysmal atrial fibrillation who presents for follow-up of shortness of breath and LE swelling.  The patient was initially diagnosed with atrial fibrillation in the ER2/13/20in the setting of pneumoniaafter presenting with symptoms of fever and chills. ECG at the ER showed afib with RVR HR 125.He was asymptomatic. Of note, his HR was in the normal range at his PCP office the day prior. He was started on metoprolol and Eliquis, his ASA and Plavix were stopped.  Seen in afib clinic 02/25/2018. Continued Eliquis and Toprol XL.   He was last seen by Dr. Harrington Challenger on 05/27/2018 symptoms of shortness of breath, wheezing and lower extremity swelling.  Patient and wife who were at the appointment were noted to be difficult to follow historians. Swelling and wheezing were reported as labile.  He has known reactive airway disease with asthma. At his last appointment his minoxidil was backed down to daily dosing. Per chart review, he contacted the office once again on 08/04/2018 in which his minoxidil was decreased to 2.5 mg twice per day. On 08/16/2018 Dr. Harrington Challenger recommended he increase minoxidil to one tablet given persistently elevated BP's at home.   He was last seen by myself on 09/07/2018 with his wife after reported a 2-day history of increased shortness of breath and LE swelling.  He denied anginal symptoms, he did describe orthopnea symptoms with sleeping on 2+ pillows in the evenings.  Wife stated that he has been wheezing.   Patient stated his shortness of breath was responsive to home inhalers.  He does have known restrictive lung disease.  He denied diet indiscretions.  He denies fever, chills, nausea, vomiting or recent cough.  Has a known history of uncontrolled hypertension however BP is stable today.  Also a recent diagnosis of atrial fibrillation but he is in a regular rhythm on my exam.  BNP was elevated during office visit at 1352.  Repeat echocardiogram with a normal LVEF at 65%.  He was started on p.o. Lasix 20 mg with K+ supplementation  Today he is seen for follow-up      1.  Shortness of breath/LE swelling: -Repeat echocardiogram for reassessment of LV and diastolic function>> was normal 03/28/2018 as above -Echocardiogram 09/08/2018 showed normal LVEF at 65% with impaired relaxation.  There is trivial pericardial effusion -Given longstanding uncontrolled hypertension, there is mild concern for LV dysfunction -BNP  drawn last office visit was elevated at 1352 -We will start low-dose Lasix 20 mg daily with K supplementation at 20 mEq daily -We will follow in 2 weeks for reassessment -Denies anginal symptoms  2.  PAF: -Maintaining sinus rhythm -Continue Eliquis 5 mg twice daily -No signs of acute bleeding in stool or urine -CHA2DS2VASc = 6  3.  HTN: -Controlled today at 140/60 -Continue amlodipine 5, clonidine 0.1 mg 3-4 times daily, Toprol 12.5 mg daily, -Reports only taking minoxidil 10 mg twice daily as opposed to 3 times daily as stated by Dr. Harrington Challenger.   -If testing comes back benign,  will need hypertension clinic referral -May need to uptitrate amlodipine and Toprol and discontinue minoxidil in the future  4.  History of restrictive lung disease: -Clear who follows his lung disease -On home albuterol, Flovent>>> reports relief with shortness of breath with inhalers -If cardiac work-up is benign, would consider referral to pulmonary medicine for PFTs -No wheezing on exam today  4.   HLD: -Continue statin  5.  Chronic kidney disease stage III: -Creatinine, 1.01 on 03/15/2018 -Repeat BMET was elevated at 1.46 on 09/07/2018     Past Medical History:  Diagnosis Date  . Acute bronchitis 06/10/2015  . Allergic rhinitis   . Anemia   . Asthma   . Carotid stenosis    a. s/p Left CEA 2002;  b. Carotid US 3/16:  patent R CEA, L < 40%  . Chronic pain syndrome    Left shoulder, back  . Diverticulosis   . Dyslipidemia   . Epigastric pain 04/05/2016  . Esophageal stricture    Distal, benign  . GERD (gastroesophageal reflux disease)   . Hemorrhoids   . HTN (hypertension)    Negative renal duplex 07-22-11  . Hx of echocardiogram    a. Echo 10/11: mod LVH, EF 55-60%  . Pneumonia 07-2013  . Prostate cancer ([place]) 2000   Seed XRT  . Spondylosis, cervical, with myelopathy 11/26/2015  . Stroke [facility]) 8/01   right thalamic - on chronic Plavix/ASA    Past Surgical History:  Procedure Laterality Date  . APPENDECTOMY  1953  . CAROTID ENDARTERECTOMY Right 01/2000  . CATARACT EXTRACTION Bilateral   . CERVICAL DISC SURGERY  8/07  . INSERTION PROSTATE RADIATION SEED  2000  . [place] SURGERY  1990s  . NASAL SINUS SURGERY     x 3     Current Outpatient Medications  Medication Sig Dispense Refill  . albuterol (PROVENTIL HFA;VENTOLIN HFA) 108 (90 Base) MCG/ACT inhaler Inhale 1-2 puffs into the lungs every 6 (six) hours as needed for wheezing or shortness of breath. 1 Inhaler 6  . amLODipine (NORVASC) 5 MG tablet TAKE 1 TABLET BY MOUTH TWO TIMES DAILY (NEEDS OV) 180 tablet 3  . apixaban (ELIQUIS) 5 MG TABS tablet Take 1 tablet (5 mg total) by mouth 2 (two) times daily. 180 tablet 3  . atorvastatin (LIPITOR) 40 MG tablet TAKE 1 TABLET BY MOUTH DAILY. 90 tablet 1  . cloNIDine (CATAPRES) 0.1 MG tablet TAKE 1 TABLET BY MOUTH THREE TO FOUR TIMES DAILY 120 tablet 10  . docusate sodium (COLACE) 100 MG capsule Take 100 mg by mouth 2 (two) times daily.     . fluticasone (FLOVENT  HFA) 110 MCG/ACT inhaler Inhale 2 puffs into the lungs 2 (two) times daily. 1 Inhaler 0  . furosemide (LASIX) 20 MG tablet Take 1 tablet (20 mg total) by mouth daily. 30 tablet 1  . gabapentin (NEURONTIN) 300 MG capsule TAKE 1 CAPSULE BY MOUTH 3 TIMES DAILY 270 capsule 1  . metoCLOPramide (REGLAN) 5 MG tablet TAKE 1 TABLET BY MOUTH AT BEDTIME (Patient taking differently: TAKE 1 TABLET BY MOUTH AT BEDTIME as needed) 30 tablet 5  . metoprolol succinate (TOPROL XL) 25 MG 24 hr tablet Take 0.5 tablets (12.5 mg total) by mouth daily. 45 tablet 3  . minoxidil (LONITEN) 10 MG tablet Take 10 mg by mouth 2 (two) times daily.    . mupirocin ointment (BACTROBAN) 2 % Apply thin film twice a day 22 g 0  . pantoprazole (PROTONIX) 40 MG tablet  TAKE 1 TABLET (40 MG TOTAL) BY MOUTH DAILY BEFORE BREAKFAST. 90 tablet 0  . polyethylene glycol (MIRALAX / GLYCOLAX) packet Take 17 g by mouth daily as needed for mild constipation. Mix in 8 oz liquid and drink    . polyvinyl alcohol (ARTIFICIAL TEARS) 1.4 % ophthalmic solution Place 1 drop into both eyes daily as needed for dry eyes.    . potassium chloride (K-DUR) 10 MEQ tablet Take 1 tablet (10 mEq total) by mouth daily. 30 tablet 1  . sulfamethoxazole-trimethoprim (BACTRIM DS) 800-160 MG tablet Take 1 tablet by mouth 2 (two) times daily. 14 tablet 0  . traMADol (ULTRAM) 50 MG tablet Take 1 tablet (50 mg total) by mouth every 8 (eight) hours as needed. 75 tablet 0  . triamcinolone cream (KENALOG) 0.5 % Apply 1 application topically daily as needed (for skin irritation).      No current facility-administered medications for this visit.     Allergies:   Diovan [valsartan], Hctz [hydrochlorothiazide], Hydralazine, Metformin and related, Prednisone, Spironolactone, Codeine, and Oxycodone-acetaminophen    Social History:  The patient  reports that he quit smoking about 40 years ago. His smoking use included cigarettes. He has a 8.75 pack-year smoking history. His smokeless  tobacco use includes chew. He reports that he does not drink alcohol or use drugs.   Family History:  The patient's ***family history includes Alcohol abuse in his brother; Cancer in his brother; Diabetes in his son; Gout in his son; Hyperlipidemia in his mother; Hypertension in his mother; Lung disease in his father; Obesity in his sister; Stroke in his brother, mother, and sister.    ROS:  Please see the history of present illness.   Otherwise, review of systems are positive for {NONE DEFAULTED:18576::"none"}.   All other systems are reviewed and negative.    PHYSICAL EXAM: VS:  There were no vitals taken for this visit. , BMI There is no height or weight on file to calculate BMI. GEN: Well nourished, well developed, in no acute distress HEENT: normal Neck: no JVD, carotid bruits, or masses Cardiac: ***RRR; no murmurs, rubs, or gallops,no edema  Respiratory:  clear to auscultation bilaterally, normal work of breathing GI: soft, nontender, nondistended, + BS MS: no deformity or atrophy Skin: warm and dry, no rash Neuro:  Strength and sensation are intact Psych: euthymic mood, full affect   EKG:  EKG {ACTION; IS/IS JME:26834196} ordered today. The ekg ordered today demonstrates ***   Recent Labs: 01/13/2018: TSH 1.94 02/24/2018: ALT 11 03/15/2018: Hemoglobin 11.3; Platelets 375 09/07/2018: BUN 18; Creatinine, Ser 1.46; NT-Pro BNP 1,352; Potassium 4.8; Sodium 138    Lipid Panel    Component Value Date/Time   CHOL 114 01/13/2018 1410   TRIG 85.0 01/13/2018 1410   HDL 46.50 01/13/2018 1410   CHOLHDL 2 01/13/2018 1410   VLDL 17.0 01/13/2018 1410   LDLCALC 50 01/13/2018 1410      Wt Readings from Last 3 Encounters:  09/07/18 160 lb 12.8 oz (72.9 kg)  08/30/18 165 lb (74.8 kg)  07/13/18 158 lb 12.8 oz (72 kg)      Other studies Reviewed: Additional studies/ records that were reviewed today include:   Echocardiogram 03/28/2018:  1. The left ventricle has normal systolic  function with an ejection fraction of 60-65%. The cavity size was normal. There is moderately increased left ventricular wall thickness. Left ventricular diastolic Doppler parameters are consistent with impaired relaxation. 2. The right ventricle has normal systolic function. The cavity was normal. 3.  Trivial pericardial effusion is present. 4. There is mild mitral annular calcification present. 5. The aortic valve is tricuspid Mild thickening of the aortic valve no stenosis of the aortic valve. 6. Normal LV function; mild diastolic dysfunction; moderate LVH.  Echocardiogram 09/08/2018:   1. The left ventricle has hyperdynamic systolic function, with an ejection fraction of >65%. The cavity size was normal. Left ventricular diastolic Doppler parameters are consistent with impaired relaxation.  2. The average left ventricular global longitudinal strain is -19.2 %.  3. The right ventricle has normal systolic function. The cavity was normal. There is no increase in right ventricular wall thickness. Right ventricular systolic pressure could not be assessed.  4. The pericardial effusion is anterior to the right ventricle and surrounding the apex.  5. Trivial pericardial effusion is present.  6. The aortic valve was not well visualized.  7. The aorta is normal unless otherwise noted.  ASSESSMENT AND PLAN:  1.  ***   Current medicines are reviewed at length with the patient today.  The patient {ACTIONS; HAS/DOES NOT HAVE:19233} concerns regarding medicines.  The following changes have been made:  {PLAN; NO CHANGE:13088:s}  Labs/ tests ordered today include: *** No orders of the defined types were placed in this encounter.    Disposition:   FU with *** in {gen number 0-35:465681} {Days to years:10300}  Signed, Kathyrn Drown, NP  09/14/2018 [facility] [place], [place], [place]  [address] Phone: [phone]; Fax: [phone]

## 2018-09-16 ENCOUNTER — Other Ambulatory Visit: Payer: Self-pay | Admitting: Family Medicine

## 2018-09-16 ENCOUNTER — Other Ambulatory Visit: Payer: Self-pay

## 2018-09-16 ENCOUNTER — Encounter (HOSPITAL_BASED_OUTPATIENT_CLINIC_OR_DEPARTMENT_OTHER): Payer: PPO | Attending: Internal Medicine

## 2018-09-16 DIAGNOSIS — M549 Dorsalgia, unspecified: Secondary | ICD-10-CM

## 2018-09-16 DIAGNOSIS — L89512 Pressure ulcer of right ankle, stage 2: Secondary | ICD-10-CM | POA: Insufficient documentation

## 2018-09-16 DIAGNOSIS — I10 Essential (primary) hypertension: Secondary | ICD-10-CM | POA: Insufficient documentation

## 2018-09-16 DIAGNOSIS — Z923 Personal history of irradiation: Secondary | ICD-10-CM | POA: Insufficient documentation

## 2018-09-16 DIAGNOSIS — Z87891 Personal history of nicotine dependence: Secondary | ICD-10-CM | POA: Insufficient documentation

## 2018-09-20 DIAGNOSIS — L97519 Non-pressure chronic ulcer of other part of right foot with unspecified severity: Secondary | ICD-10-CM | POA: Diagnosis not present

## 2018-09-20 MED FILL — traMADol HCL 50 MG TABS: 50 | 25 days supply | Qty: 75 | Fill #0

## 2018-09-20 NOTE — Telephone Encounter (Signed)
Requesting:tramadol  Contract:yes UDS:n/a Last OV:08/30/2018 w/ Percell Miller 01/11/18 w/pmd  Next OV:11/24/18 w/pmd Last Refill:08/19/18  #[id number] Database:   Please advise

## 2018-09-21 ENCOUNTER — Ambulatory Visit: Payer: PPO | Admitting: Cardiology

## 2018-09-23 DIAGNOSIS — Z87891 Personal history of nicotine dependence: Secondary | ICD-10-CM | POA: Diagnosis not present

## 2018-09-23 DIAGNOSIS — R4182 Altered mental status, unspecified: Secondary | ICD-10-CM | POA: Diagnosis not present

## 2018-09-23 DIAGNOSIS — L89519 Pressure ulcer of right ankle, unspecified stage: Secondary | ICD-10-CM | POA: Diagnosis not present

## 2018-09-23 DIAGNOSIS — Z923 Personal history of irradiation: Secondary | ICD-10-CM | POA: Diagnosis not present

## 2018-09-23 DIAGNOSIS — I10 Essential (primary) hypertension: Secondary | ICD-10-CM | POA: Diagnosis not present

## 2018-09-23 DIAGNOSIS — L89512 Pressure ulcer of right ankle, stage 2: Secondary | ICD-10-CM | POA: Diagnosis not present

## 2018-10-10 DIAGNOSIS — L89512 Pressure ulcer of right ankle, stage 2: Secondary | ICD-10-CM | POA: Diagnosis not present

## 2018-10-11 ENCOUNTER — Other Ambulatory Visit: Payer: Self-pay | Admitting: Family Medicine

## 2018-10-11 DIAGNOSIS — M549 Dorsalgia, unspecified: Secondary | ICD-10-CM

## 2018-10-12 MED FILL — PANTOPRAZOLE SOD DR 40 MG T: 40 | 90 days supply | Qty: 90 | Fill #0

## 2018-10-12 MED FILL — ELIQUIS 5 MG TABLET: 5 | 30 days supply | Qty: 60 | Fill #3

## 2018-10-12 NOTE — Telephone Encounter (Signed)
Requesting:tramadol  Contract:yes UDS:n/a Last OV:08/30/18 Next OV:11/24/18  Last Refill:09/20/18  #[id number] Database:   Please advise

## 2018-10-13 ENCOUNTER — Telehealth: Payer: Self-pay

## 2018-10-13 ENCOUNTER — Other Ambulatory Visit: Payer: Self-pay | Admitting: Family Medicine

## 2018-10-13 DIAGNOSIS — S81802D Unspecified open wound, left lower leg, subsequent encounter: Secondary | ICD-10-CM

## 2018-10-13 MED FILL — traMADol HCL 50 MG TABS: 50 | 25 days supply | Qty: 75 | Fill #0

## 2018-10-13 NOTE — Telephone Encounter (Signed)
Copied from [place] [id number]. Topic: General - Inquiry >> Oct 13, 2018  2:27 PM Oneta Rack wrote: Spouse would like orders / referral sent to [facility]. Spouse states [facility] were Percell Miller sent him is short staffed and the wait time was a hour. Please call when orders are placed [phone] / [phone]

## 2018-10-13 NOTE — Telephone Encounter (Signed)
I have placed referral please let them know

## 2018-10-14 NOTE — Telephone Encounter (Signed)
Patient's wife notified and verbalized understanding.  

## 2018-10-19 ENCOUNTER — Telehealth: Payer: Self-pay

## 2018-10-19 NOTE — Telephone Encounter (Signed)
Copied from [place] [id number]. Topic: General - Inquiry >> Oct 13, 2018  2:27 PM Oneta Rack wrote: Spouse would like orders / referral sent to [facility]. Spouse states [facility] were Percell Miller sent him is short staffed and the wait time was a hour. Please call when orders are placed [phone] / [phone]

## 2018-10-20 ENCOUNTER — Encounter (HOSPITAL_BASED_OUTPATIENT_CLINIC_OR_DEPARTMENT_OTHER): Payer: PPO | Attending: Internal Medicine | Admitting: Internal Medicine

## 2018-10-20 ENCOUNTER — Other Ambulatory Visit: Payer: Self-pay

## 2018-10-20 DIAGNOSIS — L89519 Pressure ulcer of right ankle, unspecified stage: Secondary | ICD-10-CM | POA: Diagnosis not present

## 2018-10-22 ENCOUNTER — Other Ambulatory Visit: Payer: Self-pay | Admitting: Family Medicine

## 2018-10-22 DIAGNOSIS — I10 Essential (primary) hypertension: Secondary | ICD-10-CM

## 2018-10-22 DIAGNOSIS — I6350 Cerebral infarction due to unspecified occlusion or stenosis of unspecified cerebral artery: Secondary | ICD-10-CM

## 2018-10-22 DIAGNOSIS — E782 Mixed hyperlipidemia: Secondary | ICD-10-CM

## 2018-10-22 DIAGNOSIS — E559 Vitamin D deficiency, unspecified: Secondary | ICD-10-CM

## 2018-10-23 ENCOUNTER — Emergency Department (HOSPITAL_COMMUNITY): Payer: PPO

## 2018-10-23 ENCOUNTER — Other Ambulatory Visit: Payer: Self-pay

## 2018-10-23 ENCOUNTER — Inpatient Hospital Stay (HOSPITAL_COMMUNITY)
Admission: EM | Admit: 2018-10-23 | Discharge: 2018-10-27 | DRG: 522 | Disposition: A | Discharge status: Rehabilitation Facility | Payer: PPO | Attending: Internal Medicine | Admitting: Internal Medicine

## 2018-10-23 DIAGNOSIS — Z7951 Long term (current) use of inhaled steroids: Secondary | ICD-10-CM

## 2018-10-23 DIAGNOSIS — Z79899 Other long term (current) drug therapy: Secondary | ICD-10-CM

## 2018-10-23 DIAGNOSIS — W19XXXA Unspecified fall, initial encounter: Secondary | ICD-10-CM

## 2018-10-23 DIAGNOSIS — Z8546 Personal history of malignant neoplasm of prostate: Secondary | ICD-10-CM | POA: Diagnosis not present

## 2018-10-23 DIAGNOSIS — S72042A Displaced fracture of base of neck of left femur, initial encounter for closed fracture: Secondary | ICD-10-CM | POA: Diagnosis not present

## 2018-10-23 DIAGNOSIS — K5903 Drug induced constipation: Secondary | ICD-10-CM | POA: Diagnosis not present

## 2018-10-23 DIAGNOSIS — S199XXA Unspecified injury of neck, initial encounter: Secondary | ICD-10-CM | POA: Diagnosis not present

## 2018-10-23 DIAGNOSIS — E1122 Type 2 diabetes mellitus with diabetic chronic kidney disease: Secondary | ICD-10-CM | POA: Diagnosis present

## 2018-10-23 DIAGNOSIS — I110 Hypertensive heart disease with heart failure: Secondary | ICD-10-CM | POA: Diagnosis not present

## 2018-10-23 DIAGNOSIS — I444 Left anterior fascicular block: Secondary | ICD-10-CM | POA: Diagnosis not present

## 2018-10-23 DIAGNOSIS — R52 Pain, unspecified: Secondary | ICD-10-CM | POA: Diagnosis not present

## 2018-10-23 DIAGNOSIS — G894 Chronic pain syndrome: Secondary | ICD-10-CM | POA: Diagnosis not present

## 2018-10-23 DIAGNOSIS — S72092G Other fracture of head and neck of left femur, subsequent encounter for closed fracture with delayed healing: Secondary | ICD-10-CM | POA: Diagnosis not present

## 2018-10-23 DIAGNOSIS — J45909 Unspecified asthma, uncomplicated: Secondary | ICD-10-CM | POA: Diagnosis not present

## 2018-10-23 DIAGNOSIS — I10 Essential (primary) hypertension: Secondary | ICD-10-CM | POA: Diagnosis not present

## 2018-10-23 DIAGNOSIS — E785 Hyperlipidemia, unspecified: Secondary | ICD-10-CM | POA: Diagnosis not present

## 2018-10-23 DIAGNOSIS — Z471 Aftercare following joint replacement surgery: Secondary | ICD-10-CM | POA: Diagnosis not present

## 2018-10-23 DIAGNOSIS — S72092S Other fracture of head and neck of left femur, sequela: Secondary | ICD-10-CM | POA: Diagnosis not present

## 2018-10-23 DIAGNOSIS — Z20828 Contact with and (suspected) exposure to other viral communicable diseases: Secondary | ICD-10-CM | POA: Diagnosis present

## 2018-10-23 DIAGNOSIS — K219 Gastro-esophageal reflux disease without esophagitis: Secondary | ICD-10-CM | POA: Diagnosis present

## 2018-10-23 DIAGNOSIS — Z7901 Long term (current) use of anticoagulants: Secondary | ICD-10-CM | POA: Diagnosis not present

## 2018-10-23 DIAGNOSIS — E1140 Type 2 diabetes mellitus with diabetic neuropathy, unspecified: Secondary | ICD-10-CM | POA: Diagnosis present

## 2018-10-23 DIAGNOSIS — I2510 Atherosclerotic heart disease of native coronary artery without angina pectoris: Secondary | ICD-10-CM | POA: Diagnosis not present

## 2018-10-23 DIAGNOSIS — Z8673 Personal history of transient ischemic attack (TIA), and cerebral infarction without residual deficits: Secondary | ICD-10-CM | POA: Diagnosis not present

## 2018-10-23 DIAGNOSIS — R7309 Other abnormal glucose: Secondary | ICD-10-CM | POA: Diagnosis not present

## 2018-10-23 DIAGNOSIS — N183 Chronic kidney disease, stage 3 unspecified: Secondary | ICD-10-CM | POA: Diagnosis not present

## 2018-10-23 DIAGNOSIS — S72009A Fracture of unspecified part of neck of unspecified femur, initial encounter for closed fracture: Secondary | ICD-10-CM | POA: Diagnosis present

## 2018-10-23 DIAGNOSIS — I6350 Cerebral infarction due to unspecified occlusion or stenosis of unspecified cerebral artery: Secondary | ICD-10-CM | POA: Diagnosis not present

## 2018-10-23 DIAGNOSIS — W010XXA Fall on same level from slipping, tripping and stumbling without subsequent striking against object, initial encounter: Secondary | ICD-10-CM | POA: Diagnosis present

## 2018-10-23 DIAGNOSIS — E1142 Type 2 diabetes mellitus with diabetic polyneuropathy: Secondary | ICD-10-CM | POA: Diagnosis not present

## 2018-10-23 DIAGNOSIS — Z7902 Long term (current) use of antithrombotics/antiplatelets: Secondary | ICD-10-CM

## 2018-10-23 DIAGNOSIS — K5900 Constipation, unspecified: Secondary | ICD-10-CM | POA: Diagnosis present

## 2018-10-23 DIAGNOSIS — Z03818 Encounter for observation for suspected exposure to other biological agents ruled out: Secondary | ICD-10-CM | POA: Diagnosis not present

## 2018-10-23 DIAGNOSIS — Y92008 Other place in unspecified non-institutional (private) residence as the place of occurrence of the external cause: Secondary | ICD-10-CM

## 2018-10-23 DIAGNOSIS — N3943 Post-void dribbling: Secondary | ICD-10-CM | POA: Diagnosis not present

## 2018-10-23 DIAGNOSIS — R509 Fever, unspecified: Secondary | ICD-10-CM | POA: Diagnosis not present

## 2018-10-23 DIAGNOSIS — I480 Paroxysmal atrial fibrillation: Secondary | ICD-10-CM | POA: Diagnosis present

## 2018-10-23 DIAGNOSIS — S72002S Fracture of unspecified part of neck of left femur, sequela: Secondary | ICD-10-CM | POA: Diagnosis not present

## 2018-10-23 DIAGNOSIS — N179 Acute kidney failure, unspecified: Secondary | ICD-10-CM | POA: Diagnosis present

## 2018-10-23 DIAGNOSIS — Z96642 Presence of left artificial hip joint: Secondary | ICD-10-CM | POA: Diagnosis not present

## 2018-10-23 DIAGNOSIS — R918 Other nonspecific abnormal finding of lung field: Secondary | ICD-10-CM | POA: Diagnosis not present

## 2018-10-23 DIAGNOSIS — S72002A Fracture of unspecified part of neck of left femur, initial encounter for closed fracture: Secondary | ICD-10-CM | POA: Diagnosis not present

## 2018-10-23 DIAGNOSIS — R0902 Hypoxemia: Secondary | ICD-10-CM | POA: Diagnosis not present

## 2018-10-23 DIAGNOSIS — Z7952 Long term (current) use of systemic steroids: Secondary | ICD-10-CM | POA: Diagnosis not present

## 2018-10-23 DIAGNOSIS — N390 Urinary tract infection, site not specified: Secondary | ICD-10-CM | POA: Diagnosis not present

## 2018-10-23 DIAGNOSIS — F17220 Nicotine dependence, chewing tobacco, uncomplicated: Secondary | ICD-10-CM | POA: Diagnosis not present

## 2018-10-23 DIAGNOSIS — E1165 Type 2 diabetes mellitus with hyperglycemia: Secondary | ICD-10-CM | POA: Diagnosis not present

## 2018-10-23 DIAGNOSIS — D62 Acute posthemorrhagic anemia: Secondary | ICD-10-CM | POA: Diagnosis not present

## 2018-10-23 DIAGNOSIS — R062 Wheezing: Secondary | ICD-10-CM | POA: Diagnosis not present

## 2018-10-23 DIAGNOSIS — R000 Tachycardia, unspecified: Secondary | ICD-10-CM | POA: Diagnosis not present

## 2018-10-23 DIAGNOSIS — W1830XD Fall on same level, unspecified, subsequent encounter: Secondary | ICD-10-CM | POA: Diagnosis not present

## 2018-10-23 DIAGNOSIS — I5032 Chronic diastolic (congestive) heart failure: Secondary | ICD-10-CM | POA: Diagnosis not present

## 2018-10-23 DIAGNOSIS — S0990XA Unspecified injury of head, initial encounter: Secondary | ICD-10-CM | POA: Diagnosis not present

## 2018-10-23 DIAGNOSIS — R32 Unspecified urinary incontinence: Secondary | ICD-10-CM | POA: Diagnosis not present

## 2018-10-23 DIAGNOSIS — Z96649 Presence of unspecified artificial hip joint: Secondary | ICD-10-CM

## 2018-10-23 DIAGNOSIS — G8918 Other acute postprocedural pain: Secondary | ICD-10-CM | POA: Diagnosis not present

## 2018-10-23 DIAGNOSIS — S7292XS Unspecified fracture of left femur, sequela: Secondary | ICD-10-CM | POA: Diagnosis not present

## 2018-10-23 DIAGNOSIS — R531 Weakness: Secondary | ICD-10-CM | POA: Diagnosis not present

## 2018-10-23 DIAGNOSIS — I130 Hypertensive heart and chronic kidney disease with heart failure and stage 1 through stage 4 chronic kidney disease, or unspecified chronic kidney disease: Secondary | ICD-10-CM | POA: Diagnosis present

## 2018-10-23 DIAGNOSIS — S72002D Fracture of unspecified part of neck of left femur, subsequent encounter for closed fracture with routine healing: Secondary | ICD-10-CM | POA: Diagnosis not present

## 2018-10-23 DIAGNOSIS — Z823 Family history of stroke: Secondary | ICD-10-CM | POA: Diagnosis not present

## 2018-10-23 DIAGNOSIS — E875 Hyperkalemia: Secondary | ICD-10-CM | POA: Diagnosis not present

## 2018-10-23 DIAGNOSIS — Z833 Family history of diabetes mellitus: Secondary | ICD-10-CM | POA: Diagnosis not present

## 2018-10-23 LAB — CBC WITH DIFFERENTIAL/PLATELET
Abs Immature Granulocytes: 0.06 10*3/uL (ref 0.00–0.07)
Basophils Absolute: 0.1 10*3/uL (ref 0.0–0.1)
Basophils Relative: 1 %
Eosinophils Absolute: 0.1 10*3/uL (ref 0.0–0.5)
Eosinophils Relative: 1 %
HCT: 35.2 % — ABNORMAL LOW (ref 39.0–52.0)
Hemoglobin: 12.2 g/dL — ABNORMAL LOW (ref 13.0–17.0)
Immature Granulocytes: 1 %
Lymphocytes Relative: 10 %
Lymphs Abs: 1 10*3/uL (ref 0.7–4.0)
MCH: 30.9 pg (ref 26.0–34.0)
MCHC: 34.7 g/dL (ref 30.0–36.0)
MCV: 89.1 fL (ref 80.0–100.0)
Monocytes Absolute: 0.5 10*3/uL (ref 0.1–1.0)
Monocytes Relative: 5 %
Neutro Abs: 8 10*3/uL — ABNORMAL HIGH (ref 1.7–7.7)
Neutrophils Relative %: 82 %
Platelets: 245 10*3/uL (ref 150–400)
RBC: 3.95 MIL/uL — ABNORMAL LOW (ref 4.22–5.81)
RDW: 14.1 % (ref 11.5–15.5)
WBC: 9.7 10*3/uL (ref 4.0–10.5)
nRBC: 0 % (ref 0.0–0.2)

## 2018-10-23 LAB — BASIC METABOLIC PANEL
Anion gap: 9 (ref 5–15)
BUN: 22 mg/dL (ref 8–23)
CO2: 26 mmol/L (ref 22–32)
Calcium: 8.8 mg/dL — ABNORMAL LOW (ref 8.9–10.3)
Chloride: 106 mmol/L (ref 98–111)
Creatinine, Ser: 1.09 mg/dL (ref 0.61–1.24)
GFR calc Af Amer: >60 mL/min (ref >60)
GFR calc non Af Amer: >60 mL/min (ref >60)
Glucose, Bld: 118 mg/dL — ABNORMAL HIGH (ref 70–99)
Potassium: 4.8 mmol/L (ref 3.5–5.1)
Sodium: 141 mmol/L (ref 135–145)

## 2018-10-23 LAB — PROTIME-INR
INR: 1.3 — ABNORMAL HIGH (ref 0.8–1.2)
Prothrombin Time: 15.6 seconds — ABNORMAL HIGH (ref 11.4–15.2)

## 2018-10-23 MED ORDER — FENTANYL CITRATE (PF) 100 MCG/2ML IJ SOLN
100.0000 ug | Freq: Once | INTRAMUSCULAR | Status: AC
  Administered 2018-10-23: 100 ug via INTRAVENOUS
  Filled 2018-10-23: qty 2

## 2018-10-23 MED ORDER — MORPHINE SULFATE (PF) 2 MG/ML IV SOLN
0.5000 mg | INTRAVENOUS | Status: DC | PRN
  Administered 2018-10-24: 0.5 mg via INTRAVENOUS
  Filled 2018-10-23: qty 1

## 2018-10-23 MED ORDER — TRAMADOL HCL 50 MG PO TABS: 50.0000 mg | ORAL_TABLET | Freq: Three times a day (TID) | ORAL | Status: DC | PRN

## 2018-10-23 MED ORDER — ATORVASTATIN CALCIUM 40 MG PO TABS
40.0000 mg | ORAL_TABLET | Freq: Every day | ORAL | Status: DC
  Administered 2018-10-24: 40 mg via ORAL
  Filled 2018-10-23: qty 1

## 2018-10-23 MED ORDER — HYDROCODONE-ACETAMINOPHEN 5-325 MG PO TABS: 1.0000 | ORAL_TABLET | Freq: Four times a day (QID) | ORAL | Status: DC | PRN

## 2018-10-23 MED ORDER — METOCLOPRAMIDE HCL 5 MG PO TABS: 5.0000 mg | ORAL_TABLET | Freq: Every evening | ORAL | Status: DC | PRN

## 2018-10-23 MED ORDER — LACTATED RINGERS IV SOLN: INTRAVENOUS | Status: DC

## 2018-10-23 MED ORDER — PANTOPRAZOLE SODIUM 40 MG PO TBEC
40.0000 mg | DELAYED_RELEASE_TABLET | Freq: Every day | ORAL | Status: DC
  Administered 2018-10-24 – 2018-10-27 (×4): 40 mg via ORAL
  Filled 2018-10-23 (×4): qty 1

## 2018-10-23 MED ORDER — POTASSIUM CHLORIDE CRYS ER 10 MEQ PO TBCR
10.0000 meq | EXTENDED_RELEASE_TABLET | Freq: Every day | ORAL | Status: DC
  Administered 2018-10-24: 10 meq via ORAL
  Filled 2018-10-23: qty 1

## 2018-10-23 MED ORDER — MUPIROCIN 2 % EX OINT
TOPICAL_OINTMENT | Freq: Two times a day (BID) | CUTANEOUS | Status: AC
  Administered 2018-10-24 – 2018-10-25 (×3): via TOPICAL
  Filled 2018-10-23 (×3): qty 22

## 2018-10-23 MED ORDER — MINOXIDIL 10 MG PO TABS
10.0000 mg | ORAL_TABLET | Freq: Two times a day (BID) | ORAL | Status: DC
  Administered 2018-10-24 – 2018-10-27 (×7): 10 mg via ORAL
  Filled 2018-10-23 (×8): qty 1

## 2018-10-23 MED ORDER — ENOXAPARIN SODIUM 40 MG/0.4ML ~~LOC~~ SOLN
40.0000 mg | Freq: Every day | SUBCUTANEOUS | Status: DC
  Administered 2018-10-24: 40 mg via SUBCUTANEOUS
  Filled 2018-10-23: qty 0.4

## 2018-10-23 MED ORDER — HYDROMORPHONE HCL 1 MG/ML IJ SOLN
0.5000 mg | Freq: Once | INTRAMUSCULAR | Status: AC
  Administered 2018-10-23: 0.5 mg via INTRAVENOUS
  Filled 2018-10-23: qty 1

## 2018-10-23 MED ORDER — POLYVINYL ALCOHOL 1.4 % OP SOLN
1.0000 [drp] | Freq: Every day | OPHTHALMIC | Status: DC | PRN
  Filled 2018-10-23: qty 15

## 2018-10-23 MED ORDER — ALBUTEROL SULFATE (2.5 MG/3ML) 0.083% IN NEBU: 3.0000 mL | INHALATION_SOLUTION | Freq: Four times a day (QID) | RESPIRATORY_TRACT | Status: DC | PRN

## 2018-10-23 MED ORDER — BUDESONIDE 0.25 MG/2ML IN SUSP
0.2500 mg | Freq: Two times a day (BID) | RESPIRATORY_TRACT | Status: DC
  Administered 2018-10-24: 0.25 mg via RESPIRATORY_TRACT
  Filled 2018-10-23: qty 2

## 2018-10-23 MED ORDER — AMLODIPINE BESYLATE 5 MG PO TABS
5.0000 mg | ORAL_TABLET | Freq: Every day | ORAL | Status: DC
  Administered 2018-10-24 – 2018-10-27 (×4): 5 mg via ORAL
  Filled 2018-10-23 (×4): qty 1

## 2018-10-23 MED ORDER — HYDROMORPHONE HCL 1 MG/ML IJ SOLN
1.0000 mg | Freq: Once | INTRAMUSCULAR | Status: AC
  Administered 2018-10-23: 1 mg via INTRAVENOUS
  Filled 2018-10-23: qty 1

## 2018-10-23 MED ORDER — GABAPENTIN 300 MG PO CAPS
300.0000 mg | ORAL_CAPSULE | Freq: Three times a day (TID) | ORAL | Status: DC
  Administered 2018-10-24 – 2018-10-25 (×3): 300 mg via ORAL
  Filled 2018-10-23 (×3): qty 1

## 2018-10-23 MED ORDER — CLONIDINE HCL 0.1 MG PO TABS
0.1000 mg | ORAL_TABLET | Freq: Three times a day (TID) | ORAL | Status: DC
  Administered 2018-10-24 – 2018-10-27 (×11): 0.1 mg via ORAL
  Filled 2018-10-23 (×12): qty 1

## 2018-10-23 MED ORDER — ONDANSETRON HCL 4 MG/2ML IJ SOLN
4.0000 mg | Freq: Once | INTRAMUSCULAR | Status: AC
  Administered 2018-10-23: 4 mg via INTRAVENOUS
  Filled 2018-10-23: qty 2

## 2018-10-23 MED ORDER — DOCUSATE SODIUM 100 MG PO CAPS
100.0000 mg | ORAL_CAPSULE | Freq: Two times a day (BID) | ORAL | Status: DC
  Administered 2018-10-24 – 2018-10-27 (×7): 100 mg via ORAL
  Filled 2018-10-23 (×7): qty 1

## 2018-10-23 MED ORDER — METOPROLOL SUCCINATE ER 25 MG PO TB24
12.5000 mg | ORAL_TABLET | Freq: Every day | ORAL | Status: DC
  Administered 2018-10-24 – 2018-10-27 (×4): 12.5 mg via ORAL
  Filled 2018-10-23 (×4): qty 1

## 2018-10-23 MED ORDER — FUROSEMIDE 20 MG PO TABS: 20.0000 mg | ORAL_TABLET | Freq: Every day | ORAL | Status: DC

## 2018-10-23 NOTE — Progress Notes (Signed)
with the offloading boot that was recommended he states it was much too large the one that they got an uncomfortable actually causing more trouble nonetheless they are looking for something a little bit smaller. The border foam dressings that were also sent as supplies apparently did not do well for him they do not stick well to his skin. 9/28; have not seen this wound since the beginning of the month since I admitted him to clinic. Definitely more surface area here. We have been using endoform under a border foam. His wife is changing the dressing 10/8; enlarge surface area but certainly an improvement in depth. Using endoform. Objective Constitutional Patient is hypertensive.. Pulse regular and within target range for patient.[name] Kitchen Respirations regular, non-labored and within target range.. Temperature is normal and within the target range for the patient.[name] Kitchen Appears in no distress. Vitals Time Taken: 4:09 PM, Height: 71 in, Weight: 160 lbs, BMI: 22.3, Temperature: 98.2 F, Pulse: 57 bpm, Respiratory Rate: 18 breaths/min, Blood Pressure: 180/52 mmHg. General Notes: Wound exam; areas on the right lateral malleolus. No debridement is required. The surface of this looks reasonably healthy albeit a bit pale. There is no depth that I can determine. No evidence of infection Integumentary (Hair, Skin) Wound #1 status is Open. Original cause of wound was Pressure Injury. The wound is located on the Right,Lateral Malleolus. The wound measures 0.6cm length x 0.5cm width x 0.2cm depth; 0.236cm^2 area and 0.047cm^3 volume. Assessment Active Problems ICD-10 Pressure ulcer of right  ankle, stage 2 Plan Follow-up Appointments: Wound #1 Right,Lateral Malleolus: Return Appointment in 1 week. Dressing Change Frequency: Wound #1 Right,Lateral Malleolus: Change Dressing every other day. Wound Cleansing: Wound #1 Right,Lateral Malleolus: May shower and wash wound with soap and water. Primary Wound Dressing: Wound #1 Right,Lateral Malleolus: Endoform - moisten with saline Secondary Dressing: Wound #1 Right,Lateral Malleolus: Foam Border - or large bandaid Off-Loading: Other: - use bunny boot while resting in bed or chair to aid in offloading pressure to right ankle. 1. Continue with endoform for another week. PolyMem or Hydrofera Blue might be good alternatives 2. They are changing this every second day rigorous offloading etc. Electronic Signature(s) Signed: 10/20/2018 5:49:31 PM By: Linton Ham MD Entered By: Linton Ham on 10/20/2018 17:43:57 -------------------------------------------------------------------------------- SuperBill Details Patient Name: Date of Service: [name] 10/20/2018 Medical Record [place] Patient Account Number: [id number] Date of Birth/Sex: Treating RN: [birth year] (85 y.o. Hessie Diener Primary Care Provider: Penni Homans Other Clinician: Sandre Kitty Referring Provider: Treating Provider/Extender:Ajay Strubel, Kirby Crigler, Lewis Moccasin in Treatment: 4 Diagnosis Coding ICD-10 Codes Code Description [phone] Pressure ulcer of right ankle, stage 2 Facility Procedures The patient participates with Medicare or their insurance follows the Medicare Facility Guidelines: CPT4 Code Description Modifier Quantity [id number] [place] VISIT-LEV 4 EST PT 1 Physician Procedures CPT4 Code: [id number] Description: 53646 - WC PHYS LEVEL 3 - EST PT ICD-10 Diagnosis Description L89.512 Pressure ulcer of right ankle, stage 2 Modifier: Quantity: 1 Electronic Signature(s) Signed: 10/20/2018 6:06:48 PM By: Deon Pilling Signed: 10/23/2018 10:27:00 AM By: Linton Ham MD Previous Signature: 10/20/2018 5:49:31 PM Version By: Linton Ham MD Entered By: Deon Pilling on 10/20/2018 17:50:30  Physician Orders Details Patient Name: Date of Service: [name], [name] 10/20/2018 3:30 PM Medical Record [place] Patient Account Number: [id number] Date of Birth/Sex: Treating RN: December 12, 1929 (85 y.o. Hessie Diener Primary Care Provider: Penni Homans Other Clinician: Sandre Kitty Referring Provider: Treating Provider/Extender:Kaulin Chaves, Kirby Crigler, Lewis Moccasin in Treatment: 4 Verbal / Phone Orders: No Diagnosis Coding ICD-10 Coding Code Description [phone] Pressure ulcer of right ankle, stage 2 Follow-up Appointments Wound #1 Right,Lateral Malleolus Return Appointment in 1 week. Dressing Change Frequency Wound #1 Right,Lateral Malleolus Change Dressing every other day. Wound Cleansing Wound #1 Right,Lateral Malleolus May shower and wash wound with soap and water. Primary Wound Dressing Wound #1 Right,Lateral Malleolus Endoform - moisten with saline Secondary Dressing Wound #1 Right,Lateral Malleolus Foam Border - or large bandaid Off-Loading Other: - use bunny boot while resting in bed or chair to aid in offloading pressure to right ankle. Electronic Signature(s) Signed: 10/20/2018 5:49:31 PM By: Linton Ham MD Signed: 10/20/2018 6:06:48 PM By: Deon Pilling Entered By: Deon Pilling on 10/20/2018 16:52:06 -------------------------------------------------------------------------------- Problem List Details Patient Name: Date of Service: [name], [name] 10/20/2018 3:30 PM Medical Record Number:[id number] Patient Account Number: [id number] Date of Birth/Sex: Treating RN: [birth year] (85 y.o. Hessie Diener Primary Care Provider: Penni Homans Other Clinician: Sandre Kitty Referring Provider: Treating Provider/Extender:Danne Scardina, Kirby Crigler, Lewis Moccasin in Treatment: 4 Active Problems ICD-10 Evaluated Encounter Code Description Active Date Today  Diagnosis L89.512 Pressure ulcer of right ankle, stage 2 09/16/2018 No Yes Inactive Problems Resolved Problems Electronic Signature(s) Signed: 10/20/2018 5:49:31 PM By: Linton Ham MD Entered By: Linton Ham on 10/20/2018 17:39:53 -------------------------------------------------------------------------------- Progress Note Details Patient Name: Date of Service: [name] 10/20/2018 3:30 PM Medical Record [place] Patient Account Number: [id number] Date of Birth/Sex: Treating RN: 1929-12-12 (85 y.o. Hessie Diener Primary Care Provider: Penni Homans Other Clinician: Sandre Kitty Referring Provider: Treating Provider/Extender:Leatta Alewine, Kirby Crigler, Lewis Moccasin in Treatment: 4 Subjective History of Present Illness (HPI) ADMISSION 09/16/2018 This is an 85 year old man who arrives accompanied by his wife. Apparently over the last few months he is noted a small wound on the right lateral calf. He has been using Bactroban to this. He came into the clinic today with a black surface eschar. The patient is not really sure how this happened. He states he constantly lies on the right side when he is in bed and he wears tennis shoes that may have rubbed on this area. He does not have a wound history. His primary physician saw him on 07/13/2018 a Cand 28 retia S was done that was negative. I believe he was put on antibiotics but I am not sure which one. Past medical history; restrictive lung disease, hypertension, A. fib on Eliquis, hyperlipidemia, carotid stenosis status post carotid endarterectomy, asthma, prostate CA hip treated with radiation, remote CVA in 2001 and chronic [place] renal failure. ABI in our clinic was 0.83 on the right 09/23/2018 on evaluation today patient appears to be doing about the same with regard to his ankle ulcer. There apparently was some confusion with the dressing supplies and I am unsure exactly if he is really using them appropriately. We  gave the left over endoform but again I am not sure that they have actually use the endoform as it was recommended to be used. She has the majority of this left right now. With that being said there is no signs of active infection at this time regard to the right lateral ankle. Apparently the patient is also having some issues  with the offloading boot that was recommended he states it was much too large the one that they got an uncomfortable actually causing more trouble nonetheless they are looking for something a little bit smaller. The border foam dressings that were also sent as supplies apparently did not do well for him they do not stick well to his skin. 9/28; have not seen this wound since the beginning of the month since I admitted him to clinic. Definitely more surface area here. We have been using endoform under a border foam. His wife is changing the dressing 10/8; enlarge surface area but certainly an improvement in depth. Using endoform. Objective Constitutional Patient is hypertensive.. Pulse regular and within target range for patient.[name] Kitchen Respirations regular, non-labored and within target range.. Temperature is normal and within the target range for the patient.[name] Kitchen Appears in no distress. Vitals Time Taken: 4:09 PM, Height: 71 in, Weight: 160 lbs, BMI: 22.3, Temperature: 98.2 F, Pulse: 57 bpm, Respiratory Rate: 18 breaths/min, Blood Pressure: 180/52 mmHg. General Notes: Wound exam; areas on the right lateral malleolus. No debridement is required. The surface of this looks reasonably healthy albeit a bit pale. There is no depth that I can determine. No evidence of infection Integumentary (Hair, Skin) Wound #1 status is Open. Original cause of wound was Pressure Injury. The wound is located on the Right,Lateral Malleolus. The wound measures 0.6cm length x 0.5cm width x 0.2cm depth; 0.236cm^2 area and 0.047cm^3 volume. Assessment Active Problems ICD-10 Pressure ulcer of right  ankle, stage 2 Plan Follow-up Appointments: Wound #1 Right,Lateral Malleolus: Return Appointment in 1 week. Dressing Change Frequency: Wound #1 Right,Lateral Malleolus: Change Dressing every other day. Wound Cleansing: Wound #1 Right,Lateral Malleolus: May shower and wash wound with soap and water. Primary Wound Dressing: Wound #1 Right,Lateral Malleolus: Endoform - moisten with saline Secondary Dressing: Wound #1 Right,Lateral Malleolus: Foam Border - or large bandaid Off-Loading: Other: - use bunny boot while resting in bed or chair to aid in offloading pressure to right ankle. 1. Continue with endoform for another week. PolyMem or Hydrofera Blue might be good alternatives 2. They are changing this every second day rigorous offloading etc. Electronic Signature(s) Signed: 10/20/2018 5:49:31 PM By: Linton Ham MD Entered By: Linton Ham on 10/20/2018 17:43:57 -------------------------------------------------------------------------------- SuperBill Details Patient Name: Date of Service: [name] 10/20/2018 Medical Record [place] Patient Account Number: [id number] Date of Birth/Sex: Treating RN: [birth year] (85 y.o. Hessie Diener Primary Care Provider: Penni Homans Other Clinician: Sandre Kitty Referring Provider: Treating Provider/Extender:Ajay Strubel, Kirby Crigler, Lewis Moccasin in Treatment: 4 Diagnosis Coding ICD-10 Codes Code Description [phone] Pressure ulcer of right ankle, stage 2 Facility Procedures The patient participates with Medicare or their insurance follows the Medicare Facility Guidelines: CPT4 Code Description Modifier Quantity [id number] [place] VISIT-LEV 4 EST PT 1 Physician Procedures CPT4 Code: [id number] Description: 53646 - WC PHYS LEVEL 3 - EST PT ICD-10 Diagnosis Description L89.512 Pressure ulcer of right ankle, stage 2 Modifier: Quantity: 1 Electronic Signature(s) Signed: 10/20/2018 6:06:48 PM By: Deon Pilling Signed: 10/23/2018 10:27:00 AM By: Linton Ham MD Previous Signature: 10/20/2018 5:49:31 PM Version By: Linton Ham MD Entered By: Deon Pilling on 10/20/2018 17:50:30

## 2018-10-23 NOTE — ED Provider Notes (Signed)
[facility] EMERGENCY DEPARTMENT Provider Note   CSN: [id number] Arrival date & time: 10/23/18  2143     History   Chief Complaint Chief Complaint  Patient presents with   Fall    HPI [name] is a 85 y.o. male presenting for evaluation after a fall.   Patient states he was getting up from the couch when his feet got tangled up and he fell, landing on his left hip.  Denies hitting his head or loss of consciousness.  He reports acute onset left hip pain.  He has been unable to move it since.  He denies numbness or tingling.  He is not sure if he is on a blood thinner, however this is on his med rec.  He does not know if he seen orthopedic doctor before.  He denies headache, neck pain, shoulder pain, chest pain, abdominal pain.  Additional history obtained from chart review.  Patient with a history of asthma, chronic pain, GERD, hypertension, previous stroke.      HPI  Past Medical History:  Diagnosis Date   Acute bronchitis 06/10/2015   Allergic rhinitis    Anemia    Asthma    Carotid stenosis    a. s/p Left CEA 2002;  b. Carotid US 3/16:  patent R CEA, L < 40%   Chronic pain syndrome    Left shoulder, back   Diverticulosis    Dyslipidemia    Epigastric pain 04/05/2016   Esophageal stricture    Distal, benign   GERD (gastroesophageal reflux disease)    Hemorrhoids    HTN (hypertension)    Negative renal duplex 07-22-11   Hx of echocardiogram    a. 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   Preventative health care 12/06/2016   Hyperglycemia 04/05/2016   Epigastric pain 04/05/2016   Spondylosis, cervical, with myelopathy 11/26/2015   Cervicogenic headache 71/06/2692   Umbilical hernia 85/46/2703   Asthma 10/08/2011   Carotid stenosis 03/31/2011   Chronic pain    Vitamin D deficiency 12/11/2008   HLD (hyperlipidemia) 12/11/2008   Anemia 12/11/2008   Cerebral artery occlusion with cerebral infarction ([place]) 12/11/2008   URINARY INCONTINENCE, MALE 12/11/2008   Disorder of bursae and tendons in shoulder region 12/29/2007   ANXIETY, MILD 08/09/2007   HYPERTENSION, BENIGN ESSENTIAL 01/04/2007   Allergic rhinitis 01/04/2007   HEADACHE, TENSION 11/05/2006   PROSTATE CANCER, HX OF 11/05/2006    Past Surgical History:  Procedure Laterality Date   APPENDECTOMY  1953   CAROTID ENDARTERECTOMY Right 01/2000   CATARACT EXTRACTION Bilateral    CERVICAL [place] SURGERY  8/07   INSERTION PROSTATE RADIATION SEED  2000   LUMBAR DISC SURGERY  1990s   NASAL SINUS SURGERY     x 3        Home Medications    Prior to Admission medications   Medication Sig Start  Date End Date Taking? Authorizing Provider  albuterol (PROVENTIL HFA;VENTOLIN HFA) 108 (90 Base) MCG/ACT inhaler Inhale 1-2 puffs into the lungs every 6 (six) hours as needed for wheezing or shortness of breath. 03/02/15   Melony Overly, MD  amLODipine (NORVASC) 5 MG tablet TAKE 1 TABLET BY MOUTH TWO TIMES DAILY (NEEDS OV) 05/30/18   Fay Records, MD  apixaban (ELIQUIS) 5 MG TABS tablet Take 1 tablet (5 mg total) by mouth 2 (two) times daily. 05/27/18   Fay Records, MD  atorvastatin (LIPITOR) 40 MG tablet TAKE 1 TABLET BY MOUTH DAILY. 01/25/18   [name], MD  cloNIDine (CATAPRES) 0.1 MG tablet TAKE 1 TABLET BY MOUTH THREE TO FOUR TIMES DAILY 08/01/18   Fay Records, MD  docusate sodium  (COLACE) 100 MG capsule Take 100 mg by mouth 2 (two) times daily.     [provider]  fluticasone (FLOVENT HFA) 110 MCG/ACT inhaler Inhale 2 puffs into the lungs 2 (two) times daily. 06/03/18   Fay Records, MD  furosemide (LASIX) 20 MG tablet Take 1 tablet (20 mg total) by mouth daily. 09/07/18 12/06/18  Kathyrn Drown D, NP  gabapentin (NEURONTIN) 300 MG capsule TAKE 1 CAPSULE BY MOUTH 3 TIMES DAILY 08/30/18   [name], MD  metoCLOPramide (REGLAN) 5 MG tablet TAKE 1 TABLET BY MOUTH AT BEDTIME Patient taking differently: TAKE 1 TABLET BY MOUTH AT BEDTIME as needed 04/05/18   Gatha Mayer, MD  metoprolol succinate (TOPROL XL) 25 MG 24 hr tablet Take 0.5 tablets (12.5 mg total) by mouth daily. 03/15/18   Bhagat, Crista Luria, PA  minoxidil (LONITEN) 10 MG tablet Take 10 mg by mouth 2 (two) times daily.    [provider]  mupirocin ointment (BACTROBAN) 2 % Apply thin film twice a day 07/13/18   Saguier, Percell Miller, PA-C  pantoprazole (PROTONIX) 40 MG tablet TAKE 1 TABLET (40 MG TOTAL) BY MOUTH DAILY BEFORE BREAKFAST. 07/18/18   Gatha Mayer, MD  polyethylene glycol (MIRALAX / GLYCOLAX) packet Take 17 g by mouth daily as needed for mild constipation. Mix in 8 oz liquid and drink    [provider]  polyvinyl alcohol (ARTIFICIAL TEARS) 1.4 % ophthalmic solution Place 1 drop into both eyes daily as needed for dry eyes.    [provider]  potassium chloride (K-DUR) 10 MEQ tablet Take 1 tablet (10 mEq total) by mouth daily. 09/07/18 12/06/18  Kathyrn Drown D, NP  sulfamethoxazole-trimethoprim (BACTRIM DS) 800-160 MG tablet Take 1 tablet by mouth 2 (two) times daily. 08/31/18   Saguier, Percell Miller, PA-C  traMADol (ULTRAM) 50 MG tablet TAKE 1 TABLET BY MOUTH EVERY 8 HOURS AS NEEDED 10/12/18   [name], MD  triamcinolone cream (KENALOG) 0.5 % Apply 1 application topically daily as needed (for skin irritation).  01/22/12   Janith Lima, MD    Family History Family  History  Problem Relation Age of Onset   Stroke Brother    Stroke Sister    Stroke Mother    Hyperlipidemia Mother    Hypertension Mother    Lung disease Father    Diabetes Son    Gout Son    Obesity Sister    Alcohol abuse Brother    Cancer Brother        lung, smoker    Social History Social History   Tobacco Use   Smoking status: Former Smoker    Packs/day: 0.25    Years: 35.00  Pack years: 8.75    Types: Cigarettes    Quit date: 01/12/1978    Years since quitting: 40.8   Smokeless tobacco: Current User    Types: Chew  Substance Use Topics   Alcohol use: No    Comment: Prior alcoholic, sober since 1093   Drug use: No     Allergies   Diovan [valsartan], Hctz [hydrochlorothiazide], Hydralazine, Metformin and related, Prednisone, Spironolactone, Codeine, and Oxycodone-acetaminophen   Review of Systems Review of Systems  Musculoskeletal: Positive for arthralgias.  Hematological: Bruises/bleeds easily.  All other systems reviewed and are negative.    Physical Exam Updated Vital Signs BP (!) 187/67    Pulse 87    Temp 97.8 F (36.6 C) (Oral)    Resp 12    Ht 5\' 11"  (1.803 m)    Wt 74.8 kg    SpO2 99%    BMI 23.01 kg/m   Physical Exam Vitals signs and nursing note reviewed.  Constitutional:      General: He is not in acute distress.    Appearance: He is well-developed.     Comments: Elderly male who appears uncomfortable due to pain. nontoxic  HENT:     Head: Normocephalic and atraumatic.     Comments: No sign of head trauma Eyes:     Extraocular Movements: Extraocular movements intact.     Conjunctiva/sclera: Conjunctivae normal.     Pupils: Pupils are equal, round, and reactive to light.  Neck:     Musculoskeletal: Normal range of motion and neck supple.     Comments: ttp of low cervical midline spine. Chronic per pt. Cardiovascular:     Rate and Rhythm: Normal rate and regular rhythm.     Pulses: Normal pulses.  Pulmonary:      Effort: Pulmonary effort is normal. No respiratory distress.     Breath sounds: Normal breath sounds. No wheezing.     Comments: No ttp of the chest wall Chest:     Chest wall: No tenderness.  Abdominal:     General: There is no distension.     Palpations: Abdomen is soft. There is no mass.     Tenderness: There is no abdominal tenderness. There is no guarding or rebound.  Musculoskeletal:        General: Tenderness present.     Comments: ttp of the lateral L hip. Pt's knee/leg is bent, pt unable to move it due to pain for assessment of shortening or rotation. Pedal pulses intact bilaterally. No ttp of the knee or lower leg.   Skin:    General: Skin is warm and dry.     Capillary Refill: Capillary refill takes less than 2 seconds.  Neurological:     Mental Status: He is alert and oriented to person, place, and time.      ED Treatments / Results  Labs (all labs ordered are listed, but only abnormal results are displayed) Labs Reviewed  BASIC METABOLIC PANEL - Abnormal; Notable for the following components:      Result Value   Glucose, Bld 118 (*)    Calcium 8.8 (*)    All other components within normal limits  CBC WITH DIFFERENTIAL/PLATELET - Abnormal; Notable for the following components:   RBC 3.95 (*)    Hemoglobin 12.2 (*)    HCT 35.2 (*)    Neutro Abs 8.0 (*)    All other components within normal limits  PROTIME-INR - Abnormal; Notable for the following components:   Prothrombin Time 15.6 (*)  INR 1.3 (*)    All other components within normal limits  SARS CORONAVIRUS 2 (TAT 6-24 HRS)  TYPE AND SCREEN    EKG EKG Interpretation  Date/Time:  Sunday October 23 2018 21:51:21 EDT Ventricular Rate:  68 PR Interval:    QRS Duration: 101 QT Interval:  398 QTC Calculation: 424 R Axis:   -67 Text Interpretation:  Sinus rhythm Atrial premature complex Left anterior fascicular block Anterior infarct, old When compared with ECG of 03/28/2018, No significant change was  found Confirmed by Delora Fuel ([id number]) on 10/23/2018 11:49:28 PM   Radiology Dg Chest 1 View  Result Date: 10/23/2018 CLINICAL DATA:  Hip fracture EXAM: CHEST  1 VIEW COMPARISON:  03/04/2018 FINDINGS: Patchy scarring or atelectasis at the lingula. No pleural effusion. Borderline to mild cardiomegaly with aortic atherosclerosis. No pneumothorax. IMPRESSION: No active disease.  Patchy atelectasis or scar at the lingula Electronically Signed   By: Donavan Foil M.D.   On: 10/23/2018 22:31   Ct Head Wo Contrast  Result Date: 10/23/2018 CLINICAL DATA:  Fall. EXAM: CT HEAD WITHOUT CONTRAST CT CERVICAL SPINE WITHOUT CONTRAST TECHNIQUE: Multidetector CT imaging of the head and cervical spine was performed following the standard protocol without intravenous contrast. Multiplanar CT image reconstructions of the cervical spine were also generated. COMPARISON:  Cervical spine x-rays dated January 11, 2018. CT head dated August 10, 2013. MRI cervical spine dated September 06, 2004. FINDINGS: CT HEAD FINDINGS Brain: No evidence of acute infarction, hemorrhage, hydrocephalus, extra-axial collection or mass lesion/mass effect. Small old infarcts in the right thalamus and right occipital lobe again noted. Vascular: Atherosclerotic vascular calcification of the carotid siphons. No hyperdense vessel. Skull: Normal. Negative for fracture or focal lesion. Sinuses/Orbits: No acute finding. Chronic bilateral maxillary sinus disease. Other: None. CT CERVICAL SPINE FINDINGS Alignment: Normal. Skull base and vertebrae: No acute fracture. No primary bone lesion or focal pathologic process. Soft tissues and spinal canal: No prevertebral fluid or swelling. No visible canal hematoma. Disc levels: Prior C4-C6 ACDF with solid interbody fusion. Moderate to severe adjacent segment degenerative disc disease at C6-C7. Mild degenerative disc disease at C2-C3 and C3-C4. Diffuse moderate to severe facet arthropathy throughout the cervical spine.  Upper chest: Negative. Other: None. IMPRESSION: 1. No acute intracranial abnormality. Small old infarcts in the right thalamus and right occipital lobe. 2. No acute cervical spine fracture. Prior C4-C6 ACDF with moderate to severe adjacent segment disease at C6-C7. Electronically Signed   By: Titus Dubin M.D.   On: 10/23/2018 23:07   Ct Cervical Spine Wo Contrast  Result Date: 10/23/2018 CLINICAL DATA:  Fall. EXAM: CT HEAD WITHOUT CONTRAST CT CERVICAL SPINE WITHOUT CONTRAST TECHNIQUE: Multidetector CT imaging of the head and cervical spine was performed following the standard protocol without intravenous contrast. Multiplanar CT image reconstructions of the cervical spine were also generated. COMPARISON:  Cervical spine x-rays dated January 11, 2018. CT head dated August 10, 2013. MRI cervical spine dated September 06, 2004. FINDINGS: CT HEAD FINDINGS Brain: No evidence of acute infarction, hemorrhage, hydrocephalus, extra-axial collection or mass lesion/mass effect. Small old infarcts in the right thalamus and right occipital lobe again noted. Vascular: Atherosclerotic vascular calcification of the carotid siphons. No hyperdense vessel. Skull: Normal. Negative for fracture or focal lesion. Sinuses/Orbits: No acute finding. Chronic bilateral maxillary sinus disease. Other: None. CT CERVICAL SPINE FINDINGS Alignment: Normal. Skull base and vertebrae: No acute fracture. No primary bone lesion or focal pathologic process. Soft tissues and spinal canal: No prevertebral fluid or  swelling. No visible canal hematoma. Disc levels: Prior C4-C6 ACDF with solid interbody fusion. Moderate to severe adjacent segment degenerative disc disease at C6-C7. Mild degenerative disc disease at C2-C3 and C3-C4. Diffuse moderate to severe facet arthropathy throughout the cervical spine. Upper chest: Negative. Other: None. IMPRESSION: 1. No acute intracranial abnormality. Small old infarcts in the right thalamus and right occipital  lobe. 2. No acute cervical spine fracture. Prior C4-C6 ACDF with moderate to severe adjacent segment disease at C6-C7. Electronically Signed   By: Titus Dubin M.D.   On: 10/23/2018 23:07   Dg Hip Unilat With Pelvis 2-3 Views Left  Result Date: 10/23/2018 CLINICAL DATA:  Fall with left-sided hip pain EXAM: DG HIP (WITH OR WITHOUT PELVIS) 2-3V LEFT COMPARISON:  CT 07/03/2016 FINDINGS: SI joints are non widened. Pubic symphysis and rami appear intact. Prostate seeds are noted. Right femoral head projects in joint. Moderate degenerative changes of the right hip. Acute left femoral neck fracture with mild cephalad migration of the trochanter. Left femoral head projects in joint. IMPRESSION: Acute mildly displaced left femoral neck fracture Electronically Signed   By: Donavan Foil M.D.   On: 10/23/2018 22:30    Procedures Procedures (including critical care time)  Medications Ordered in ED Medications  amLODipine (NORVASC) tablet 5 mg (has no administration in time range)  atorvastatin (LIPITOR) tablet 40 mg (has no administration in time range)  cloNIDine (CATAPRES) tablet 0.1 mg (has no administration in time range)  furosemide (LASIX) tablet 20 mg (has no administration in time range)  metoprolol succinate (TOPROL-XL) 24 hr tablet 12.5 mg (has no administration in time range)  minoxidil (LONITEN) tablet 10 mg (has no administration in time range)  docusate sodium (COLACE) capsule 100 mg (has no administration in time range)  metoCLOPramide (REGLAN) tablet 5 mg (has no administration in time range)  pantoprazole (PROTONIX) EC tablet 40 mg (has no administration in time range)  gabapentin (NEURONTIN) capsule 300 mg (has no administration in time range)  potassium chloride (KLOR-CON) CR tablet 10 mEq (has no administration in time range)  albuterol (PROVENTIL) (2.5 MG/3ML) 0.083% nebulizer solution 3 mL (has no administration in time range)  fluticasone (FLOVENT HFA) 110 MCG/ACT inhaler 2  puff (has no administration in time range)  polyvinyl alcohol (LIQUIFILM TEARS) 1.4 % ophthalmic solution 1 drop (has no administration in time range)  mupirocin ointment (BACTROBAN) 2 % (has no administration in time range)  HYDROcodone-acetaminophen (NORCO/VICODIN) 5-325 MG per tablet 1-2 tablet (has no administration in time range)  morphine 2 MG/ML injection 0.5 mg (has no administration in time range)  enoxaparin (LOVENOX) injection 40 mg (has no administration in time range)  lactated ringers infusion (has no administration in time range)  HYDROmorphone (DILAUDID) injection 0.5 mg (0.5 mg Intravenous Given 10/23/18 2234)  ondansetron (ZOFRAN) injection 4 mg (4 mg Intravenous Given 10/23/18 2256)  fentaNYL (SUBLIMAZE) injection 100 mcg (100 mcg Intravenous Given 10/23/18 2255)  HYDROmorphone (DILAUDID) injection 1 mg (1 mg Intravenous Given 10/23/18 2338)     Initial Impression / Assessment and Plan / ED Course  I have reviewed the triage vital signs and the nursing notes.  Pertinent labs & imaging results that were available during my care of the patient were reviewed by me and considered in my medical decision making (see chart for details).        Patient presenting for evaluation of left hip pain after a fall.  Physical exam shows patient who is neurovascularly intact.  As he is  on blood thinners, will obtain CT head.  As he has tenderness in his neck with a distracting injury, will obtain CT neck.  Chest x-ray and hip x-rays ordered.  Will obtain basic labs, as I expect he likely has a hip fracture and will need surgery. Pt given 0.5 mg dilaudid.   Hip x-ray viewed interpreted by me, shows hip fracture.  Per radiology read, slight dislocation.  Chest x-ray negative.  CT head and neck negative.  Patient and wife are requesting evaluation by [facility], the patient is not established with them. Fentanyl given as pt has continued pain.    Discussed with Dr. Gladstone Lighter from  orthopedics, who will evaluate the pt. Requests admission to hospitalist service. 1 mg dilaudid given for further pain control.   Discussed with Dr. Linda Hedges from [facility], pt to be admitted.   Final Clinical Impressions(s) / ED Diagnoses   Final diagnoses:  Closed fracture of left hip, initial encounter [facility])  Fall, initial encounter    ED Discharge Orders    None       Franchot Heidelberg, PA-C 10/24/18 0000    Davonna Belling, MD 10/26/18 0700

## 2018-10-23 NOTE — ED Triage Notes (Signed)
Brought in by in EMS. Tripped and fell onto left side, at home. Pt is unable to straighten left leg. Pain rated as 10/10. Fentanyl administered by EMS. Alert and oriented.

## 2018-10-23 NOTE — ED Notes (Signed)
Pt's O2 sat noted to be 88% on RA. Pt placed on 2L O2 via [place] prior to administration of 1 mg dilaudid.

## 2018-10-24 ENCOUNTER — Inpatient Hospital Stay (HOSPITAL_COMMUNITY): Payer: PPO

## 2018-10-24 ENCOUNTER — Inpatient Hospital Stay (HOSPITAL_COMMUNITY): Payer: PPO | Admitting: Certified Registered Nurse Anesthetist

## 2018-10-24 ENCOUNTER — Encounter (HOSPITAL_COMMUNITY)
Admission: EM | Disposition: A | Discharge status: Rehabilitation Facility | Payer: Self-pay | Source: Home / Self Care | Attending: Internal Medicine

## 2018-10-24 ENCOUNTER — Encounter (HOSPITAL_COMMUNITY): Payer: Self-pay

## 2018-10-24 DIAGNOSIS — I10 Essential (primary) hypertension: Secondary | ICD-10-CM

## 2018-10-24 DIAGNOSIS — S72002A Fracture of unspecified part of neck of left femur, initial encounter for closed fracture: Principal | ICD-10-CM

## 2018-10-24 HISTORY — PX: HIP ARTHROPLASTY: SHX981

## 2018-10-24 LAB — SURGICAL PCR SCREEN
MRSA, PCR: NEGATIVE
Staphylococcus aureus: NEGATIVE

## 2018-10-24 LAB — SARS CORONAVIRUS 2 (TAT 6-24 HRS): SARS Coronavirus 2: NEGATIVE

## 2018-10-24 LAB — TYPE AND SCREEN
ABO/RH(D): O POS
Antibody Screen: NEGATIVE

## 2018-10-24 SURGERY — HEMIARTHROPLASTY, HIP, DIRECT ANTERIOR APPROACH, FOR FRACTURE
Anesthesia: General | Site: Hip | Laterality: Left

## 2018-10-24 MED ORDER — ALUM & MAG HYDROXIDE-SIMETH 200-200-20 MG/5ML PO SUSP: 30.0000 mL | ORAL | Status: DC | PRN

## 2018-10-24 MED ORDER — HYDROCODONE-ACETAMINOPHEN 5-325 MG PO TABS: 1.0000 | ORAL_TABLET | ORAL | Status: DC | PRN

## 2018-10-24 MED ORDER — METOCLOPRAMIDE HCL 5 MG PO TABS: 5.0000 mg | ORAL_TABLET | Freq: Three times a day (TID) | ORAL | Status: DC | PRN

## 2018-10-24 MED ORDER — LIDOCAINE 2% (20 MG/ML) 5 ML SYRINGE
INTRAMUSCULAR | Status: DC | PRN
  Administered 2018-10-24: 100 mg via INTRAVENOUS

## 2018-10-24 MED ORDER — POVIDONE-IODINE 10 % EX SWAB: 2.0000 "application " | Freq: Once | CUTANEOUS | Status: DC

## 2018-10-24 MED ORDER — EPHEDRINE SULFATE-NACL 50-0.9 MG/10ML-% IV SOSY
PREFILLED_SYRINGE | INTRAVENOUS | Status: DC | PRN
  Administered 2018-10-24: 5 mg via INTRAVENOUS

## 2018-10-24 MED ORDER — DIPHENHYDRAMINE HCL 25 MG PO CAPS: 25.0000 mg | ORAL_CAPSULE | Freq: Four times a day (QID) | ORAL | Status: DC | PRN

## 2018-10-24 MED ORDER — ONDANSETRON HCL 4 MG/2ML IJ SOLN: 4.0000 mg | Freq: Four times a day (QID) | INTRAMUSCULAR | Status: DC | PRN

## 2018-10-24 MED ORDER — ONDANSETRON HCL 4 MG PO TABS: 4.0000 mg | ORAL_TABLET | Freq: Four times a day (QID) | ORAL | Status: DC | PRN

## 2018-10-24 MED ORDER — LACTATED RINGERS IV SOLN
INTRAVENOUS | Status: DC | PRN
  Administered 2018-10-24: 17:00:00 via INTRAVENOUS

## 2018-10-24 MED ORDER — MENTHOL 3 MG MT LOZG: 1.0000 | LOZENGE | OROMUCOSAL | Status: DC | PRN

## 2018-10-24 MED ORDER — TRANEXAMIC ACID-NACL 1000-0.7 MG/100ML-% IV SOLN
INTRAVENOUS | Status: AC
  Filled 2018-10-24: qty 100

## 2018-10-24 MED ORDER — ATORVASTATIN CALCIUM 40 MG PO TABS
40.0000 mg | ORAL_TABLET | Freq: Every day | ORAL | Status: DC
  Administered 2018-10-25 – 2018-10-26 (×2): 40 mg via ORAL
  Filled 2018-10-24 (×2): qty 1

## 2018-10-24 MED ORDER — POLYETHYLENE GLYCOL 3350 17 G PO PACK
17.0000 g | PACK | Freq: Every day | ORAL | Status: DC | PRN
  Filled 2018-10-24: qty 1

## 2018-10-24 MED ORDER — CEFAZOLIN SODIUM-DEXTROSE 2-3 GM-%(50ML) IV SOLR
INTRAVENOUS | Status: DC | PRN
  Administered 2018-10-24 (×2): 2 g via INTRAVENOUS

## 2018-10-24 MED ORDER — MEPERIDINE HCL 25 MG/ML IJ SOLN: 6.2500 mg | INTRAMUSCULAR | Status: DC | PRN

## 2018-10-24 MED ORDER — FENTANYL CITRATE (PF) 100 MCG/2ML IJ SOLN: 25.0000 ug | INTRAMUSCULAR | Status: DC | PRN

## 2018-10-24 MED ORDER — FENTANYL CITRATE (PF) 250 MCG/5ML IJ SOLN
INTRAMUSCULAR | Status: DC | PRN
  Administered 2018-10-24: 100 ug via INTRAVENOUS
  Administered 2018-10-24 (×2): 50 ug via INTRAVENOUS
  Administered 2018-10-24 (×2): 25 ug via INTRAVENOUS

## 2018-10-24 MED ORDER — METHOCARBAMOL 500 MG PO TABS: 500.0000 mg | ORAL_TABLET | Freq: Four times a day (QID) | ORAL | Status: DC | PRN

## 2018-10-24 MED ORDER — LACTATED RINGERS IV SOLN
INTRAVENOUS | Status: DC
  Administered 2018-10-24: 17:00:00 via INTRAVENOUS

## 2018-10-24 MED ORDER — EPHEDRINE 5 MG/ML INJ
INTRAVENOUS | Status: AC
  Filled 2018-10-24: qty 10

## 2018-10-24 MED ORDER — METHOCARBAMOL 1000 MG/10ML IJ SOLN
500.0000 mg | Freq: Four times a day (QID) | INTRAVENOUS | Status: DC | PRN
  Filled 2018-10-24: qty 5

## 2018-10-24 MED ORDER — FERROUS SULFATE 325 (65 FE) MG PO TABS
325.0000 mg | ORAL_TABLET | Freq: Three times a day (TID) | ORAL | Status: DC
  Administered 2018-10-25 – 2018-10-27 (×8): 325 mg via ORAL
  Filled 2018-10-24 (×8): qty 1

## 2018-10-24 MED ORDER — DEXAMETHASONE SODIUM PHOSPHATE 10 MG/ML IJ SOLN
INTRAMUSCULAR | Status: AC
  Filled 2018-10-24: qty 1

## 2018-10-24 MED ORDER — KETOROLAC TROMETHAMINE 15 MG/ML IJ SOLN
15.0000 mg | Freq: Four times a day (QID) | INTRAMUSCULAR | Status: DC | PRN
  Administered 2018-10-24 – 2018-10-27 (×5): 15 mg via INTRAVENOUS
  Filled 2018-10-24 (×5): qty 1

## 2018-10-24 MED ORDER — BISACODYL 10 MG RE SUPP: 10.0000 mg | Freq: Every day | RECTAL | Status: DC | PRN

## 2018-10-24 MED ORDER — BUDESONIDE 0.25 MG/2ML IN SUSP
0.2500 mg | Freq: Two times a day (BID) | RESPIRATORY_TRACT | Status: DC
  Administered 2018-10-24 – 2018-10-27 (×6): 0.25 mg via RESPIRATORY_TRACT
  Filled 2018-10-24 (×6): qty 2

## 2018-10-24 MED ORDER — APIXABAN 5 MG PO TABS
5.0000 mg | ORAL_TABLET | Freq: Two times a day (BID) | ORAL | Status: DC
  Administered 2018-10-25 – 2018-10-27 (×5): 5 mg via ORAL
  Filled 2018-10-24 (×5): qty 1

## 2018-10-24 MED ORDER — ONDANSETRON HCL 4 MG/2ML IJ SOLN
INTRAMUSCULAR | Status: DC | PRN
  Administered 2018-10-24: 4 mg via INTRAVENOUS

## 2018-10-24 MED ORDER — CHLORHEXIDINE GLUCONATE 4 % EX LIQD
60.0000 mL | Freq: Once | CUTANEOUS | Status: DC
  Filled 2018-10-24: qty 60

## 2018-10-24 MED ORDER — DEXAMETHASONE SODIUM PHOSPHATE 10 MG/ML IJ SOLN
INTRAMUSCULAR | Status: DC | PRN
  Administered 2018-10-24: 5 mg via INTRAVENOUS

## 2018-10-24 MED ORDER — SODIUM CHLORIDE 0.9 % IR SOLN
Status: DC | PRN
  Administered 2018-10-24: 1000 mL

## 2018-10-24 MED ORDER — FENTANYL CITRATE (PF) 250 MCG/5ML IJ SOLN
INTRAMUSCULAR | Status: AC
  Filled 2018-10-24: qty 5

## 2018-10-24 MED ORDER — TRANEXAMIC ACID-NACL 1000-0.7 MG/100ML-% IV SOLN
INTRAVENOUS | Status: DC | PRN
  Administered 2018-10-24: 1000 mg via INTRAVENOUS

## 2018-10-24 MED ORDER — CEFAZOLIN SODIUM-DEXTROSE 2-4 GM/100ML-% IV SOLN: 2.0000 g | INTRAVENOUS | Status: AC

## 2018-10-24 MED ORDER — ONDANSETRON HCL 4 MG/2ML IJ SOLN
INTRAMUSCULAR | Status: AC
  Filled 2018-10-24: qty 2

## 2018-10-24 MED ORDER — CEFAZOLIN SODIUM-DEXTROSE 2-4 GM/100ML-% IV SOLN
INTRAVENOUS | Status: AC
  Filled 2018-10-24: qty 100

## 2018-10-24 MED ORDER — PROPOFOL 10 MG/ML IV BOLUS
INTRAVENOUS | Status: DC | PRN
  Administered 2018-10-24: 90 mg via INTRAVENOUS

## 2018-10-24 MED ORDER — HYDROMORPHONE HCL 1 MG/ML IJ SOLN: 0.5000 mg | INTRAMUSCULAR | Status: DC | PRN

## 2018-10-24 MED ORDER — MAGNESIUM HYDROXIDE 400 MG/5ML PO SUSP: 15.0000 mL | Freq: Every day | ORAL | Status: DC | PRN

## 2018-10-24 MED ORDER — DOCUSATE SODIUM 100 MG PO CAPS: 100.0000 mg | ORAL_CAPSULE | Freq: Two times a day (BID) | ORAL | Status: DC

## 2018-10-24 MED ORDER — STERILE WATER FOR IRRIGATION IR SOLN
Status: DC | PRN
  Administered 2018-10-24: 1000 mL

## 2018-10-24 MED ORDER — ROCURONIUM BROMIDE 10 MG/ML (PF) SYRINGE
PREFILLED_SYRINGE | INTRAVENOUS | Status: DC | PRN
  Administered 2018-10-24: 50 mg via INTRAVENOUS

## 2018-10-24 MED ORDER — METOCLOPRAMIDE HCL 5 MG/ML IJ SOLN: 5.0000 mg | Freq: Three times a day (TID) | INTRAMUSCULAR | Status: DC | PRN

## 2018-10-24 MED ORDER — ROCURONIUM BROMIDE 10 MG/ML (PF) SYRINGE
PREFILLED_SYRINGE | INTRAVENOUS | Status: AC
  Filled 2018-10-24: qty 20

## 2018-10-24 MED ORDER — SUGAMMADEX SODIUM 200 MG/2ML IV SOLN
INTRAVENOUS | Status: DC | PRN
  Administered 2018-10-24: 149.6 mg via INTRAVENOUS

## 2018-10-24 MED ORDER — LIDOCAINE 2% (20 MG/ML) 5 ML SYRINGE
INTRAMUSCULAR | Status: AC
  Filled 2018-10-24: qty 10

## 2018-10-24 MED ORDER — ONDANSETRON HCL 4 MG/2ML IJ SOLN: 4.0000 mg | Freq: Once | INTRAMUSCULAR | Status: DC | PRN

## 2018-10-24 MED ORDER — TRANEXAMIC ACID-NACL 1000-0.7 MG/100ML-% IV SOLN: 1000.0000 mg | INTRAVENOUS | Status: AC

## 2018-10-24 MED ORDER — PHENYLEPHRINE 40 MCG/ML (10ML) SYRINGE FOR IV PUSH (FOR BLOOD PRESSURE SUPPORT)
PREFILLED_SYRINGE | INTRAVENOUS | Status: AC
  Filled 2018-10-24: qty 10

## 2018-10-24 MED ORDER — CEFAZOLIN SODIUM-DEXTROSE 2-4 GM/100ML-% IV SOLN
2.0000 g | Freq: Four times a day (QID) | INTRAVENOUS | Status: AC
  Administered 2018-10-25 (×2): 2 g via INTRAVENOUS
  Filled 2018-10-24: qty 100

## 2018-10-24 MED ORDER — ACETAMINOPHEN 325 MG PO TABS
325.0000 mg | ORAL_TABLET | Freq: Four times a day (QID) | ORAL | Status: DC | PRN
  Administered 2018-10-26: 650 mg via ORAL
  Filled 2018-10-24 (×2): qty 2

## 2018-10-24 MED ORDER — HYDROCODONE-ACETAMINOPHEN 7.5-325 MG PO TABS
1.0000 | ORAL_TABLET | ORAL | Status: DC | PRN
  Administered 2018-10-25 – 2018-10-26 (×2): 1 via ORAL
  Filled 2018-10-24 (×2): qty 1

## 2018-10-24 MED ORDER — PROPOFOL 10 MG/ML IV BOLUS
INTRAVENOUS | Status: AC
  Filled 2018-10-24: qty 20

## 2018-10-24 MED ORDER — PHENOL 1.4 % MT LIQD: 1.0000 | OROMUCOSAL | Status: DC | PRN

## 2018-10-24 MED FILL — ATORVASTATIN 40 MG TABLET: 40 | 90 days supply | Qty: 90 | Fill #0

## 2018-10-24 SURGICAL SUPPLY — 54 items
ADH SKN CLS APL DERMABOND .7 (GAUZE/BANDAGES/DRESSINGS) ×1
BALL HIP DEPUY 53 (Hips) IMPLANT
BLADE SAW SGTL 18X1.27X75 (BLADE) ×2 IMPLANT
COVER SURGICAL LIGHT HANDLE (MISCELLANEOUS) ×2 IMPLANT
COVER WAND RF STERILE (DRAPES) ×2 IMPLANT
DERMABOND ADVANCED (GAUZE/BANDAGES/DRESSINGS) ×1
DERMABOND ADVANCED .7 DNX12 (GAUZE/BANDAGES/DRESSINGS) ×1 IMPLANT
DRAPE IMP U-DRAPE 54X76 (DRAPES) ×2 IMPLANT
DRAPE INCISE IOBAN 85X60 (DRAPES) ×2 IMPLANT
DRAPE ORTHO SPLIT 77X108 STRL (DRAPES) ×4
DRAPE SURG ORHT 6 SPLT 77X108 (DRAPES) ×2 IMPLANT
DRAPE U-SHAPE 47X51 STRL (DRAPES) ×2 IMPLANT
DRSG AQUACEL AG ADV 3.5X10 (GAUZE/BANDAGES/DRESSINGS) ×2 IMPLANT
DURAPREP 26ML APPLICATOR (WOUND CARE) ×2 IMPLANT
ELECT BLADE 4.0 EZ CLEAN MEGAD (MISCELLANEOUS) ×2
ELECT REM PT RETURN 9FT ADLT (ELECTROSURGICAL) ×2
ELECTRODE BLDE 4.0 EZ CLN MEGD (MISCELLANEOUS) ×1 IMPLANT
ELECTRODE REM PT RTRN 9FT ADLT (ELECTROSURGICAL) ×1 IMPLANT
EVACUATOR 1/8 PVC DRAIN (DRAIN) IMPLANT
FACESHIELD WRAPAROUND (MASK) ×4 IMPLANT
FACESHIELD WRAPAROUND OR TEAM (MASK) ×2 IMPLANT
GLOVE BIOGEL PI IND STRL 7.5 (GLOVE) ×1 IMPLANT
GLOVE BIOGEL PI IND STRL 8.5 (GLOVE) ×2 IMPLANT
GLOVE BIOGEL PI INDICATOR 7.5 (GLOVE) ×1
GLOVE BIOGEL PI INDICATOR 8.5 (GLOVE) ×2
GLOVE ECLIPSE 8.0 STRL XLNG CF (GLOVE) ×2 IMPLANT
GLOVE ORTHO TXT STRL SZ7.5 (GLOVE) ×2 IMPLANT
GOWN STRL REUS W/ TWL LRG LVL3 (GOWN DISPOSABLE) ×3 IMPLANT
GOWN STRL REUS W/TWL 2XL LVL3 (GOWN DISPOSABLE) ×2 IMPLANT
GOWN STRL REUS W/TWL LRG LVL3 (GOWN DISPOSABLE) ×6
HANDPIECE INTERPULSE COAX TIP (DISPOSABLE)
HIP BALL DEPUY 53 (Hips) ×2 IMPLANT
IMMOBILIZER KNEE 22 UNIV (SOFTGOODS) ×2 IMPLANT
KIT BASIN OR (CUSTOM PROCEDURE TRAY) ×2 IMPLANT
KIT TURNOVER KIT B (KITS) ×2 IMPLANT
MANIFOLD NEPTUNE II (INSTRUMENTS) ×2 IMPLANT
NS IRRIG 1000ML POUR BTL (IV SOLUTION) ×2 IMPLANT
PACK TOTAL JOINT (CUSTOM PROCEDURE TRAY) ×2 IMPLANT
PACK UNIVERSAL I (CUSTOM PROCEDURE TRAY) ×2 IMPLANT
PAD ARMBOARD 7.5X6 YLW CONV (MISCELLANEOUS) ×4 IMPLANT
SET HNDPC FAN SPRY TIP SCT (DISPOSABLE) IMPLANT
SPACER FEM TAPERED +5 12/14 (Hips) ×1 IMPLANT
SPONGE LAP 4X18 RFD (DISPOSABLE) ×4 IMPLANT
STEM TRI LOC GRIPTION SZ 6 STD IMPLANT
SUT MNCRL AB 4-0 PS2 18 (SUTURE) IMPLANT
SUT VIC AB 1 CT1 27 (SUTURE) ×4
SUT VIC AB 1 CT1 27XBRD ANBCTR (SUTURE) ×2 IMPLANT
SUT VIC AB 2-0 CT1 27 (SUTURE) ×2
SUT VIC AB 2-0 CT1 TAPERPNT 27 (SUTURE) IMPLANT
TOWEL GREEN STERILE (TOWEL DISPOSABLE) ×2 IMPLANT
TOWEL GREEN STERILE FF (TOWEL DISPOSABLE) ×2 IMPLANT
TRAY FOLEY W/BAG SLVR 14FR (SET/KITS/TRAYS/PACK) IMPLANT
TRI LOC GRIPTION SZ 6 STD ×2 IMPLANT
WATER STERILE IRR 1000ML POUR (IV SOLUTION) ×8 IMPLANT

## 2018-10-24 NOTE — Progress Notes (Signed)
PCR sample sent, consent obtained, CHG bath done. Lovenox given this AM, MD made aware. Wife at bedside.

## 2018-10-24 NOTE — Progress Notes (Signed)
Patient ID: [name], male   DOB: [birth year], 85 y.o.   MRN: [id number]  Pain in left hip after fall Pain with any movement  AFVSS  Left displaced femoral neck fracture   To OR tonight for left hip hemiarthroplasty NPO Consent on chart Resume eliquis tomorrow  Will be WBAT post op

## 2018-10-24 NOTE — Plan of Care (Signed)
  Problem: Clinical Measurements: Goal: Postoperative complications will be avoided or minimized Outcome: Progressing   Problem: Self-Concept: Goal: Ability to maintain and perform role responsibilities to the fullest extent possible will improve Outcome: Progressing   Problem: Pain Management: Goal: Pain level will decrease Outcome: Progressing   Problem: Education: Goal: Knowledge of General Education information will improve Description: Including pain rating scale, medication(s)/side effects and non-pharmacologic comfort measures 10/24/2018 1214 by Williams Che, RN Outcome: Progressing 10/24/2018 1213 by Williams Che, RN Outcome: Progressing   Problem: Health Behavior/Discharge Planning: Goal: Ability to manage health-related needs will improve Outcome: Progressing   Problem: Clinical Measurements: Goal: Ability to maintain clinical measurements within normal limits will improve Outcome: Progressing   Problem: Activity: Goal: Risk for activity intolerance will decrease 10/24/2018 1214 by Williams Che, RN Outcome: Progressing 10/24/2018 1213 by Williams Che, RN Outcome: Progressing   Problem: Nutrition: Goal: Adequate nutrition will be maintained Outcome: Progressing   Problem: Coping: Goal: Level of anxiety will decrease 10/24/2018 1214 by Williams Che, RN Outcome: Progressing 10/24/2018 1213 by Williams Che, RN Outcome: Progressing   Problem: Elimination: Goal: Will not experience complications related to bowel motility Outcome: Progressing   Problem: Pain Managment: Goal: General experience of comfort will improve 10/24/2018 1214 by Williams Che, RN Outcome: Progressing 10/24/2018 1213 by Williams Che, RN Outcome: Progressing   Problem: Safety: Goal: Ability to remain free from injury will improve 10/24/2018 1214 by Williams Che, RN Outcome: Progressing 10/24/2018 1213 by Williams Che,  RN Outcome: Progressing   Problem: Skin Integrity: Goal: Risk for impaired skin integrity will decrease 10/24/2018 1214 by Williams Che, RN Outcome: Progressing 10/24/2018 1213 by Williams Che, RN Outcome: Progressing

## 2018-10-24 NOTE — Anesthesia Preprocedure Evaluation (Addendum)
Anesthesia Evaluation  Patient identified by MRN, date of birth, ID band Patient awake    Reviewed: Allergy & Precautions, NPO status , Patient's Chart, lab work & pertinent test results  Airway Mallampati: II  TM Distance: >3 FB Neck ROM: Full    Dental  (+) Chipped, Dental Advisory Given, Teeth Intact   Pulmonary asthma , pneumonia, former smoker,    Pulmonary exam normal breath sounds clear to auscultation       Cardiovascular hypertension, Pt. on medications Normal cardiovascular exam Rhythm:Regular Rate:Normal  Echo 09/08/2018  1. The left ventricle has hyperdynamic systolic function, with an ejection fraction of >65%. The cavity size was normal. Left ventricular diastolic Doppler parameters are consistent with impaired relaxation.  2. The average left ventricular global longitudinal strain is -19.2 %.  3. The right ventricle has normal systolic function. The cavity was normal. There is no increase in right ventricular wall thickness. Right ventricular systolic pressure could not be assessed.  4. The pericardial effusion is anterior to the right ventricle and surrounding the apex.  5. Trivial pericardial effusion is present.  6. The aortic valve was not well visualized.  7. The aorta is normal unless otherwise noted.   Neuro/Psych  Headaches, PSYCHIATRIC DISORDERS Anxiety Depression CVA    GI/Hepatic Neg liver ROS, GERD  ,  Endo/Other  negative endocrine ROS  Renal/GU negative Renal ROS     Musculoskeletal negative musculoskeletal ROS (+)   Abdominal   Peds  Hematology negative hematology ROS (+) anemia ,   Anesthesia Other Findings   Reproductive/Obstetrics                           Anesthesia Physical Anesthesia Plan  ASA: III  Anesthesia Plan: General   Post-op Pain Management:    Induction: Intravenous  PONV Risk Score and Plan: 3 and Dexamethasone, Ondansetron and Treatment  may vary due to age or medical condition  Airway Management Planned: Oral ETT  Additional Equipment: None  Intra-op Plan:   Post-operative Plan: Extubation in OR  Informed Consent: I have reviewed the patients History and Physical, chart, labs and discussed the procedure including the risks, benefits and alternatives for the proposed anesthesia with the patient or authorized representative who has indicated his/her understanding and acceptance.     Dental advisory given  Plan Discussed with: CRNA  Anesthesia Plan Comments:         Anesthesia Quick Evaluation

## 2018-10-24 NOTE — Anesthesia Procedure Notes (Signed)
Procedure Name: Intubation Performed by: Nolon Nations, MD Pre-anesthesia Checklist: Patient identified, Emergency Drugs available, Suction available and Patient being monitored Patient Re-evaluated:Patient Re-evaluated prior to induction Oxygen Delivery Method: Circle system utilized Preoxygenation: Pre-oxygenation with 100% oxygen Induction Type: IV induction Ventilation: Mask ventilation without difficulty Laryngoscope Size: Mac and 4 Grade View: Grade II Tube type: Oral Number of attempts: 2 (First intubation without issue, but assumption made that esophageal intubation. Reintubated without issue, without significant change. No desaturation. Significant delay in CO2 on capnographa. Likely bronchospasm.) Airway Equipment and Method: Stylet and Oral airway Placement Confirmation: ETT inserted through vocal cords under direct vision,  positive ETCO2 and breath sounds checked- equal and bilateral Secured at: 21 cm Tube secured with: Tape Dental Injury: Teeth and Oropharynx as per pre-operative assessment

## 2018-10-24 NOTE — Anesthesia Postprocedure Evaluation (Signed)
Anesthesia Post Note  Patient: [name]  Procedure(s) Performed: ARTHROPLASTY BIPOLAR HIP (HEMIARTHROPLASTY) (Left Hip)     Patient location during evaluation: PACU Anesthesia Type: General Level of consciousness: sedated and patient cooperative Pain management: pain level controlled Vital Signs Assessment: post-procedure vital signs reviewed and stable Respiratory status: spontaneous breathing Cardiovascular status: stable Anesthetic complications: no    Last Vitals:  Vitals:   10/24/18 1353 10/24/18 1828  BP: (!) 149/42   Pulse: 63   Resp: 17   Temp: 37.1 C (!) 36.2 C  SpO2: 99%     Last Pain:  Vitals:   10/24/18 1828  TempSrc:   PainSc: 6     LLE Motor Response: Purposeful movement (10/24/18 1828) LLE Sensation: Full sensation (10/24/18 1828)          Nolon Nations

## 2018-10-24 NOTE — Plan of Care (Signed)
  Problem: Pain Managment: Goal: General experience of comfort will improve Outcome: Progressing   Problem: Safety: Goal: Ability to remain free from injury will improve Outcome: Progressing   

## 2018-10-24 NOTE — Interval H&P Note (Signed)
History and Physical Interval Note:  10/24/2018 4:43 PM  [name]  has presented today for surgery, with the diagnosis of left femural fracture.  The various methods of treatment have been discussed with the patient and family. After consideration of risks, benefits and other options for treatment, the patient has consented to  Procedure(s): ARTHROPLASTY BIPOLAR HIP (HEMIARTHROPLASTY) (Left) as a surgical intervention.  The patient's history has been reviewed, patient examined, no change in status, stable for surgery.  I have reviewed the patient's chart and labs.  Questions were answered to the patient's satisfaction.     Mauri Pole

## 2018-10-24 NOTE — Op Note (Signed)
NAME:  [name]                   MEDICAL RECORD NO.: [id number]   LOCATION:  1914                         FACILITY:  [facility]   DATE OF BIRTH:  06-Nov-1929  PHYSICIAN:  Pietro Cassis. Alvan Dame, M.D.     DATE OF PROCEDURE:  10/24/2018                               OPERATIVE REPORT     PREOPERATIVE DIAGNOSIS:  Left displaced femoral neck fracture.   POSTOPERATIVE DIAGNOSIS:  Left displaced femoral neck fracture.   PROCEDURE:  Left hip hemiarthroplasty utilizing DePuy component, size 6 standard Tri-Lock stem with a 57mm unipolar ball with a +5 adapter.   SURGEON:  Pietro Cassis. Alvan Dame, MD   ASSISTANT:  Griffith Citron, PA-C.   ANESTHESIA:  General.   SPECIMENS:  None.   DRAINS:  None.   BLOOD LOSS:  About 100 cc.   COMPLICATIONS:  None.   INDICATION OF PROCEDURE:  Mr Birr is a pleasant 85 year old male who lives Independently with his wife.  He is very active and highly functional.  He unfortunately had a fall at their home.  He was admitted to the hospital after radiographs revealed a displaced femoral neck fracture.  He was seen and evaluated and was scheduled for surgery for fixation.  The necessity of surgical repair was discussed with she and her family.  Consent was obtained after reviewing risks of infection, DVT, component failure, and need for revision surgery.   PROCEDURE IN DETAIL:  The patient was brought to the operative theater. Once adequate anesthesia, preoperative antibiotics, 2 g of Ancef administered, the patient was positioned into the right lateral decubitus position with the left side up.  The left lower extremity was then prepped and draped in sterile fashion.  A time-out was performed identifying the patient, planned procedure, and extremity.   A lateral incision was made off the proximal trochanter. Sharp dissection was carried down to the iliotibial band and gluteal fascia. The gluteal fascia was then incised for posterior approach.  The short external  rotators were taken down separate from the posterior capsule. An L capsulotomy was made preserving the posterior leaflet for later anatomic repair. Fracture site was identified and after removing comminuted segments of the posterior femoral neck, the femoral head was removed without difficulty and measured on the back table  using the sizing rings and determined to be 53 mm in diameter.   The proximal femur was then exposed.  Retractors placed.  I then drilled, opened the proximal femur.  Then I hand reamed once and  Irrigated the canal to try to prevent fat emboli.  I began broaching the femur with a starter broach up to a size 6 broach with good medial and lateral metaphyseal fit without evidence of any torsion or movement.  A trial reduction was carried out with a standard neck and a +5 adapter with a 65mm ball.  The hip reduced nicely.  The leg lengths appeared to be equal compared to the down leg.   The hip went through a range of motion without evidence of any subluxation or impingement.   Given these findings, the trial components removed.  The final 6 standard  Tri-Lock stem was opened.  After irrigating the canal, the final stem was impacted and sat at the level where the broach was. Based on this and the trial reduction, a +5 adapter was opened and impacted in the 25mm unipolar ball onto a clean and dry trunnion.  The hip had been irrigated throughout the case and again at this point.  I re- Approximated the posterior capsule to the superior leaflet using a  #1 Vicryl,  and placed a medium Hemovac drain deep.  The remainder of the wound was closed with #1 Vicryl in the iliotibial band and gluteal fascia, a  2-0 Vicryl in the sub-Q tissue and a running 4-0 Monocryl in the skin.  The hip was cleaned, dried, and dressed sterilely using Dermabond and Aquacel dressing.  Drain site was dressed separately.  He was then brought to recovery room, extubated in stable condition,  tolerating the procedure well.  Griffith Citron, PA-C was present and utilized as Environmental consultant for the entire case from  Preoperative positioning to management of the contralateral extremity and retractors to  General facilitation of the procedure.  He was also involved with primary wound closure.         Pietro Cassis Alvan Dame, M.D.

## 2018-10-24 NOTE — H&P (View-Only) (Signed)
Patient ID: [name], male   DOB: [birth year], 85 y.o.   MRN: [id number]  Pain in left hip after fall Pain with any movement  AFVSS  Left displaced femoral neck fracture   To OR tonight for left hip hemiarthroplasty NPO Consent on chart Resume eliquis tomorrow  Will be WBAT post op

## 2018-10-24 NOTE — Consult Note (Signed)
Reason for Consult:Hip Fracture on the Left Referring Physician: DR.Meser  [name] is an 85 y.o. male.  HPI: He fell at home and sustained a closed,displaced Left Femoral Neck Fracture.He has weakness of his Left Lower secondary to a Stroke and is on Eliquis.  Past Medical History:  Diagnosis Date  . Acute bronchitis 06/10/2015  . Allergic rhinitis   . Anemia   . Asthma   . Carotid stenosis    a. s/p Left CEA 2002;  b. Carotid US 3/16:  patent R CEA, L < 40%  . Chronic pain syndrome    Left shoulder, back  . Diverticulosis   . Dyslipidemia   . Epigastric pain 04/05/2016  . Esophageal stricture    Distal, benign  . GERD (gastroesophageal reflux disease)   . Hemorrhoids   . HTN (hypertension)    Negative renal duplex 07-22-11  . Hx of echocardiogram    a. Echo 10/11: mod LVH, EF 55-60%  . Pneumonia 07-2013  . Prostate cancer ([place]) 2000   Seed XRT  . Spondylosis, cervical, with myelopathy 11/26/2015  . 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 APPENDECTOMY  1953  . CAROTID ENDARTERECTOMY Right 01/2000  . CATARACT EXTRACTION Bilateral   . CERVICAL DISC SURGERY  8/07  . INSERTION PROSTATE RADIATION SEED  2000  . [place] SURGERY  1990s  . NASAL SINUS SURGERY     x 3    Family History  Problem Relation Age of Onset  . Stroke Brother   . Stroke Sister   . Stroke Mother   . Hyperlipidemia Mother   . Hypertension Mother   . Lung disease Father   . Diabetes Son   . Gout Son   . Obesity Sister   . Alcohol abuse Brother   . Cancer Brother        lung, smoker    Social History:  reports that he quit smoking about 40 years ago. His smoking use included cigarettes. He has a 8.75 pack-year smoking history. His smokeless tobacco use includes chew. He reports that he does not drink alcohol or use drugs.  Allergies:  Allergies  Allergen Reactions  . Diovan [Valsartan] Other (See Comments)    Elevated potassium hyperkalemia  . Hctz [Hydrochlorothiazide] Other (See Comments)    Swelling and dyspnea   . Hydralazine     Chest pain, GI issues, shortness of breath  . Metformin And Related Other (See Comments)    Unknown reaction  . Prednisone     Suicidal thoughts  . Spironolactone Swelling  . Codeine Nausea Only  . Oxycodone-Acetaminophen Nausea Only    Medications: I have reviewed the patient's current medications.  Results for orders placed or performed during the hospital encounter of 10/23/18 (from the past 48 hour(s))  Type and screen [place]     Status: None   Collection Time: 10/23/18 10:54 PM  Result Value Ref Range   ABO/RH(D) O POS    Antibody Screen NEG    Sample Expiration      10/26/2018,2359 Performed at [facility], [place] [address], [place], [place] [address]   Basic metabolic panel     Status: Abnormal   Collection Time: 10/23/18 11:00 PM  Result Value Ref Range   Sodium 141 135 - 145 mmol/L   Potassium 4.8 3.5 - 5.1 mmol/L   Chloride 106 98 - 111  mmol/L   CO2 26 22 - 32 mmol/L   Glucose, Bld 118 (H) 70 - 99 mg/dL   BUN 22 8 - 23 mg/dL   Creatinine, Ser 1.09 0.61 - 1.24 mg/dL   Calcium 8.8 (L) 8.9 - 10.3 mg/dL   GFR calc non Af Amer >60 >60 mL/min   GFR calc Af Amer >60 >60 mL/min   Anion gap 9 5 - 15    Comment: Performed at [facility] [address], [place], [place] [address]  CBC WITH DIFFERENTIAL     Status: Abnormal   Collection Time: 10/23/18 11:00 PM  Result Value Ref Range   WBC 9.7 4.0 - 10.5 K/uL   RBC 3.95 (L) 4.22 - 5.81 MIL/uL   Hemoglobin 12.2 (L) 13.0 - 17.0 g/dL   HCT 35.2 (L) 39.0 - 52.0 %   MCV 89.1 80.0 - 100.0 fL   MCH 30.9 26.0 - 34.0 pg   MCHC 34.7 30.0 - 36.0 g/dL   RDW 14.1 11.5 - 15.5 %   Platelets 245 150 - 400 K/uL   nRBC 0.0 0.0 - 0.2 %   Neutrophils Relative % 82 %   Neutro Abs 8.0 (H) 1.7 - 7.7 K/uL   Lymphocytes Relative 10 %   Lymphs Abs 1.0 0.7 - 4.0 K/uL    Monocytes Relative 5 %   Monocytes Absolute 0.5 0.1 - 1.0 K/uL   Eosinophils Relative 1 %   Eosinophils Absolute 0.1 0.0 - 0.5 K/uL   Basophils Relative 1 %   Basophils Absolute 0.1 0.0 - 0.1 K/uL   Immature Granulocytes 1 %   Abs Immature Granulocytes 0.06 0.00 - 0.07 K/uL    Comment: Performed at [facility] [address] [place], [place] [address]  Protime-INR     Status: Abnormal   Collection Time: 10/23/18 11:00 PM  Result Value Ref Range   Prothrombin Time 15.6 (H) 11.4 - 15.2 seconds   INR 1.3 (H) 0.8 - 1.2    Comment: (NOTE) INR goal varies based on device and disease states. Performed at [facility], [place] [address], [place], [place] [address]     Dg Chest 1 View  Result Date: 10/23/2018 CLINICAL DATA:  Hip fracture EXAM: CHEST  1 VIEW COMPARISON:  03/04/2018 FINDINGS: Patchy scarring or atelectasis at the lingula. No pleural effusion. Borderline to mild cardiomegaly with aortic atherosclerosis. No pneumothorax. IMPRESSION: No active disease.  Patchy atelectasis or scar at the lingula Electronically Signed   By: Donavan Foil M.D.   On: 10/23/2018 22:31   Ct Head Wo Contrast  Result Date: 10/23/2018 CLINICAL DATA:  Fall. EXAM: CT HEAD WITHOUT CONTRAST CT CERVICAL SPINE WITHOUT CONTRAST TECHNIQUE: Multidetector CT imaging of the head and cervical spine was performed following the standard protocol without intravenous contrast. Multiplanar CT image reconstructions of the cervical spine were also generated. COMPARISON:  Cervical spine x-rays dated January 11, 2018. CT head dated August 10, 2013. MRI cervical spine dated September 06, 2004. FINDINGS: CT HEAD FINDINGS Brain: No evidence of acute infarction, hemorrhage, hydrocephalus, extra-axial collection or mass lesion/mass effect. Small old infarcts in the right thalamus and right occipital lobe again noted. Vascular: Atherosclerotic vascular calcification of the carotid siphons. No hyperdense vessel. Skull: Normal.  Negative for fracture or focal lesion. Sinuses/Orbits: No acute finding. Chronic bilateral maxillary sinus disease. Other: None. CT CERVICAL SPINE FINDINGS Alignment: Normal. Skull base and vertebrae: No acute fracture. No primary bone lesion or focal pathologic process.  Soft tissues and spinal canal: No prevertebral fluid or swelling. No visible canal hematoma. Disc levels: Prior C4-C6 ACDF with solid interbody fusion. Moderate to severe adjacent segment degenerative disc disease at C6-C7. Mild degenerative disc disease at C2-C3 and C3-C4. Diffuse moderate to severe facet arthropathy throughout the cervical spine. Upper chest: Negative. Other: None. IMPRESSION: 1. No acute intracranial abnormality. Small old infarcts in the right thalamus and right occipital lobe. 2. No acute cervical spine fracture. Prior C4-C6 ACDF with moderate to severe adjacent segment disease at C6-C7. Electronically Signed   By: Titus Dubin M.D.   On: 10/23/2018 23:07   Ct Cervical Spine Wo Contrast  Result Date: 10/23/2018 CLINICAL DATA:  Fall. EXAM: CT HEAD WITHOUT CONTRAST CT CERVICAL SPINE WITHOUT CONTRAST TECHNIQUE: Multidetector CT imaging of the head and cervical spine was performed following the standard protocol without intravenous contrast. Multiplanar CT image reconstructions of the cervical spine were also generated. COMPARISON:  Cervical spine x-rays dated January 11, 2018. CT head dated August 10, 2013. MRI cervical spine dated September 06, 2004. FINDINGS: CT HEAD FINDINGS Brain: No evidence of acute infarction, hemorrhage, hydrocephalus, extra-axial collection or mass lesion/mass effect. Small old infarcts in the right thalamus and right occipital lobe again noted. Vascular: Atherosclerotic vascular calcification of the carotid siphons. No hyperdense vessel. Skull: Normal. Negative for fracture or focal lesion. Sinuses/Orbits: No acute finding. Chronic bilateral maxillary sinus disease. Other: None. CT CERVICAL SPINE  FINDINGS Alignment: Normal. Skull base and vertebrae: No acute fracture. No primary bone lesion or focal pathologic process. Soft tissues and spinal canal: No prevertebral fluid or swelling. No visible canal hematoma. Disc levels: Prior C4-C6 ACDF with solid interbody fusion. Moderate to severe adjacent segment degenerative disc disease at C6-C7. Mild degenerative disc disease at C2-C3 and C3-C4. Diffuse moderate to severe facet arthropathy throughout the cervical spine. Upper chest: Negative. Other: None. IMPRESSION: 1. No acute intracranial abnormality. Small old infarcts in the right thalamus and right occipital lobe. 2. No acute cervical spine fracture. Prior C4-C6 ACDF with moderate to severe adjacent segment disease at C6-C7. Electronically Signed   By: Titus Dubin M.D.   On: 10/23/2018 23:07   Dg Hip Unilat With Pelvis 2-3 Views Left  Result Date: 10/23/2018 CLINICAL DATA:  Fall with left-sided hip pain EXAM: DG HIP (WITH OR WITHOUT PELVIS) 2-3V LEFT COMPARISON:  CT 07/03/2016 FINDINGS: SI joints are non widened. Pubic symphysis and rami appear intact. Prostate seeds are noted. Right femoral head projects in joint. Moderate degenerative changes of the right hip. Acute left femoral neck fracture with mild cephalad migration of the trochanter. Left femoral head projects in joint. IMPRESSION: Acute mildly displaced left femoral neck fracture Electronically Signed   By: Donavan Foil M.D.   On: 10/23/2018 22:30    Review of Systems  Constitutional: Negative.   HENT: Negative.   Eyes: Negative.   Respiratory: Negative.   Cardiovascular: Negative.   Gastrointestinal: Negative.   Genitourinary: Negative.   Musculoskeletal: Positive for joint pain.  Skin: Negative.   Neurological: Positive for focal weakness.  Endo/Heme/Allergies: Negative.   Psychiatric/Behavioral: Negative.    Blood pressure (!) 163/67, pulse 87, temperature 97.8 F (36.6 C), temperature source Oral, resp. rate 13, height  5\' 11"  (1.803 m), weight 74.8 kg, SpO2 95 %. Physical Exam  Constitutional: He appears well-developed.  HENT:  Head: Normocephalic.  Eyes: Pupils are equal, round, and reactive to light.  Neck: Normal range of motion.  Cardiovascular: Normal rate.  Respiratory: Effort normal.  GI: Soft.  Musculoskeletal:        General: Deformity present.  Neurological: He is alert.  Skin:  Ulceration right  Ankle    Assessment/Plan: Left Total Hip  Latanya Maudlin 10/24/2018, 12:06 AM

## 2018-10-24 NOTE — H&P (Signed)
History and Physical    [name]:[id number] DOB: 05-30-1929 DOA: 10/23/2018  PCP: [name], MD (Confirm with patient/family/NH records and if not entered, this has to be entered at [facility] point of entry) Patient coming from: Patient is coming from home  I have personally briefly reviewed patient's old medical records in [place]  Chief Complaint: Severe hip pain following a fall  HPI: [name] is a 85 y.o. male with medical history significant of hypertension GERD mild asthma and previous stroke.  Is very active.  He was at home and was getting up some food and tripped over his own feet falling down onto his left side.  He had immediate onset of pain in the left hip area and was unable to stand or bear weight.  Because of the pain and unable to bear weight he presented to the emergency department for evaluation. te in the HPI section of the daily progress note).  ED Course: Patient was hemodynamically stable.  CT scan of the head was negative for injury.  Patient did have x-ray of the left hip and pelvis revealing a displaced fracture of the left femoral neck.  He was treated with pain medication in the emergency department.  [facility] is [facility] to [facility] for [facility] for hip fracture  Review of Systems: As per HPI otherwise 10 point review of systems negative.    Past Medical History:  Diagnosis Date   Acute bronchitis 06/10/2015   Allergic rhinitis    Anemia    Asthma    Carotid stenosis    a. s/p Left CEA 2002;  b. Carotid US 3/16:  patent R CEA, L < 40%   Chronic pain syndrome    Left shoulder, back   Diverticulosis    Dyslipidemia    Epigastric pain 04/05/2016   Esophageal stricture    Distal, benign   GERD (gastroesophageal reflux disease)    Hemorrhoids    HTN (hypertension)    Negative renal duplex 07-22-11   Hx of echocardiogram    a. Echo 10/11: mod LVH, EF 55-60%   Pneumonia 07-2013   Prostate cancer ([place]) 2000   Seed  XRT   Spondylosis, cervical, with myelopathy 11/26/2015   Stroke ([place]) 8/01   right thalamic - on chronic Plavix/ASA    Past Surgical History:  Procedure Laterality Date   APPENDECTOMY  1953   CAROTID ENDARTERECTOMY Right 01/2000   CATARACT EXTRACTION Bilateral    CERVICAL [place] SURGERY  8/07   INSERTION PROSTATE RADIATION SEED  2000   LUMBAR DISC SURGERY  1990s   NASAL SINUS SURGERY     x 3   Social history -patient has been married 45 years.  He has 3 stepchildren and multiple grandchildren.  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  He and his wife live independently.  He remains very active including driving and yard work.  He has children in town.   reports that he quit smoking about 40 years ago. His smoking use included cigarettes. He has a 8.75 pack-year smoking history. His smokeless tobacco use includes chew. He reports that he does not drink alcohol or use drugs.  Allergies  Allergen Reactions   Diovan [Valsartan] Other (See Comments)    Elevated potassium hyperkalemia   Hctz [Hydrochlorothiazide] Other (See Comments)    Swelling and dyspnea    Hydralazine     Chest pain, GI issues, shortness of breath   Metformin And Related Other (See Comments)  Unknown reaction   Prednisone     Suicidal thoughts   Spironolactone Swelling   Codeine Nausea Only   Oxycodone-Acetaminophen Nausea Only    Family History  Problem Relation Age of Onset   Stroke Brother    Stroke Sister    Stroke Mother    Hyperlipidemia Mother    Hypertension Mother    Lung disease Father    Diabetes Son    Gout Son    Obesity Sister    Alcohol abuse Brother    Cancer Brother        lung, smoker     Prior to Admission medications   Medication Sig Start Date End Date Taking? Authorizing Provider  albuterol (PROVENTIL HFA;VENTOLIN HFA) 108 (90 Base) MCG/ACT inhaler Inhale 1-2 puffs into the lungs every 6 (six) hours as needed for wheezing or  shortness of breath. 03/02/15   Melony Overly, MD  amLODipine (NORVASC) 5 MG tablet TAKE 1 TABLET BY MOUTH TWO TIMES DAILY (NEEDS OV) 05/30/18   Fay Records, MD  apixaban (ELIQUIS) 5 MG TABS tablet Take 1 tablet (5 mg total) by mouth 2 (two) times daily. 05/27/18   Fay Records, MD  atorvastatin (LIPITOR) 40 MG tablet TAKE 1 TABLET BY MOUTH DAILY. 01/25/18   [name], MD  cloNIDine (CATAPRES) 0.1 MG tablet TAKE 1 TABLET BY MOUTH THREE TO FOUR TIMES DAILY 08/01/18   Fay Records, MD  docusate sodium (COLACE) 100 MG capsule Take 100 mg by mouth 2 (two) times daily.     [provider]  fluticasone (FLOVENT HFA) 110 MCG/ACT inhaler Inhale 2 puffs into the lungs 2 (two) times daily. 06/03/18   Fay Records, MD  furosemide (LASIX) 20 MG tablet Take 1 tablet (20 mg total) by mouth daily. 09/07/18 12/06/18  Kathyrn Drown D, NP  gabapentin (NEURONTIN) 300 MG capsule TAKE 1 CAPSULE BY MOUTH 3 TIMES DAILY 08/30/18   [name], MD  metoCLOPramide (REGLAN) 5 MG tablet TAKE 1 TABLET BY MOUTH AT BEDTIME Patient taking differently: TAKE 1 TABLET BY MOUTH AT BEDTIME as needed 04/05/18   Gatha Mayer, MD  metoprolol succinate (TOPROL XL) 25 MG 24 hr tablet Take 0.5 tablets (12.5 mg total) by mouth daily. 03/15/18   Bhagat, Crista Luria, PA  minoxidil (LONITEN) 10 MG tablet Take 10 mg by mouth 2 (two) times daily.    [provider]  mupirocin ointment (BACTROBAN) 2 % Apply thin film twice a day 07/13/18   Saguier, Percell Miller, PA-C  pantoprazole (PROTONIX) 40 MG tablet TAKE 1 TABLET (40 MG TOTAL) BY MOUTH DAILY BEFORE BREAKFAST. 07/18/18   Gatha Mayer, MD  polyethylene glycol (MIRALAX / GLYCOLAX) packet Take 17 g by mouth daily as needed for mild constipation. Mix in 8 oz liquid and drink    [provider]  polyvinyl alcohol (ARTIFICIAL TEARS) 1.4 % ophthalmic solution Place 1 drop into both eyes daily as needed for dry eyes.    [provider]  potassium chloride (K-DUR) 10  MEQ tablet Take 1 tablet (10 mEq total) by mouth daily. 09/07/18 12/06/18  Kathyrn Drown D, NP  sulfamethoxazole-trimethoprim (BACTRIM DS) 800-160 MG tablet Take 1 tablet by mouth 2 (two) times daily. 08/31/18   Saguier, Percell Miller, PA-C  traMADol (ULTRAM) 50 MG tablet TAKE 1 TABLET BY MOUTH EVERY 8 HOURS AS NEEDED 10/12/18   [name], MD  triamcinolone cream (KENALOG) 0.5 % Apply 1 application topically daily as needed (for skin irritation).  01/22/12   Janith Lima, MD    Physical Exam: Vitals:   10/23/18 2300 10/23/18 2330 10/23/18 2345 10/24/18 0000  BP: (!) 199/66 (!) 170/59 (!) 187/67 (!) 163/67  Pulse: 83 85 87 87  Resp: 16 13 12 13   Temp:      TempSrc:      SpO2: 92% 93% 99% 95%  Weight:      Height:        Constitutional: NAD, calm, comfortable Vitals:   10/23/18 2300 10/23/18 2330 10/23/18 2345 10/24/18 0000  BP: (!) 199/66 (!) 170/59 (!) 187/67 (!) 163/67  Pulse: 83 85 87 87  Resp: 16 13 12 13   Temp:      TempSrc:      SpO2: 92% 93% 99% 95%  Weight:      Height:      Enteral appearance: Thin elderly man who is resting comfortably at the time of exam Eyes: PERRL, lids and conjunctivae normal.  Patient does have intraocular lenses bilaterally. ENMT: Mucous membranes are moist. Posterior pharynx clear of any exudate or lesions.edentulous on the mandible and missing most of his teeth on the maxilla.  Oral lesions are noted Neck: normal, supple, no masses, no thyromegaly Respiratory: clear to auscultation bilaterally, no wheezing, no crackles. Normal respiratory effort. No accessory muscle use.  Cardiovascular: Regular rate and rhythm, no murmurs / rubs / gallops. No extremity edema. 2+ radial and pedal pulses. No carotid bruits.  Abdomen: no tenderness, no masses palpated. No hepatosplenomegaly. Bowel sounds positive.  Musculoskeletal: no clubbing / cyanosis.  Rotation of the left leg is noted.  Patient is unable to move his left leg but all other extremities move  normally., no contractures. Normal muscle tone.  Skin: Fragile skin of the elderly is noted.  Patient has a large bandage on the lateral aspect of his right ankle Neurologic: CN 2-12 grossly intact. Sensation intact, Strength 4/5 in all 4.  Psychiatric: Normal judgment and insight. Alert and oriented x 3. Normal mood.     Labs on Admission: I have personally reviewed following labs and imaging studies  CBC: Recent Labs  Lab 10/23/18 2300  WBC 9.7  NEUTROABS 8.0*  HGB 12.2*  HCT 35.2*  MCV 89.1  PLT 161   Basic Metabolic Panel: Recent Labs  Lab 10/23/18 2300  NA 141  K 4.8  CL 106  CO2 26  GLUCOSE 118*  BUN 22  CREATININE 1.09  CALCIUM 8.8*   GFR: Estimated Creatinine Clearance: 50.5 mL/min (by C-G formula based on SCr of 1.09 mg/dL). Liver Function Tests: No results for input(s): AST, ALT, ALKPHOS, BILITOT, PROT, ALBUMIN in the last 168 hours. No results for input(s): LIPASE, AMYLASE in the last 168 hours. No results for input(s): AMMONIA in the last 168 hours. Coagulation Profile: Recent Labs  Lab 10/23/18 2300  INR 1.3*   Cardiac Enzymes: No results for input(s): CKTOTAL, CKMB, CKMBINDEX, TROPONINI in the last 168 hours. BNP (last 3 results) Recent Labs    09/07/18 0948  PROBNP 1,352*   HbA1C: No results for input(s): HGBA1C in the last 72 hours. CBG: No results for input(s): GLUCAP in the last 168 hours. Lipid Profile: No results for input(s): CHOL, HDL, LDLCALC, TRIG, CHOLHDL, LDLDIRECT in the last 72 hours. Thyroid Function Tests: No results for input(s): TSH, T4TOTAL, FREET4, T3FREE, THYROIDAB in the last 72 hours. Anemia Panel: No results for input(s): VITAMINB12, FOLATE, FERRITIN, TIBC, IRON, RETICCTPCT in the last 72 hours. Urine analysis:    Component  Value Date/Time   COLORURINE STRAW (A) 12/27/2016 0324   APPEARANCEUR CLEAR 12/27/2016 0324   LABSPEC 1.005 12/27/2016 0324   PHURINE 6.0 12/27/2016 0324   GLUCOSEU NEGATIVE 12/27/2016 0324    GLUCOSEU 100 (A) 12/01/2016 1858   HGBUR NEGATIVE 12/27/2016 0324   HGBUR 2+ 12/11/2008 1025   BILIRUBINUR 1+ 02/23/2018 [place] 12/27/2016 0324   PROTEINUR Positive (A) 02/23/2018 1641   PROTEINUR NEGATIVE 12/27/2016 0324   UROBILINOGEN negative (A) 02/23/2018 1641   UROBILINOGEN 0.2 12/01/2016 1858   NITRITE Negative 02/23/2018 1641   NITRITE NEGATIVE 12/27/2016 0324   LEUKOCYTESUR Negative 02/23/2018 1641    Radiological Exams on Admission: Dg Chest 1 View  Result Date: 10/23/2018 CLINICAL DATA:  Hip fracture EXAM: CHEST  1 VIEW COMPARISON:  03/04/2018 FINDINGS: Patchy scarring or atelectasis at the lingula. No pleural effusion. Borderline to mild cardiomegaly with aortic atherosclerosis. No pneumothorax. IMPRESSION: No active disease.  Patchy atelectasis or scar at the lingula Electronically Signed   By: Donavan Foil M.D.   On: 10/23/2018 22:31   Ct Head Wo Contrast  Result Date: 10/23/2018 CLINICAL DATA:  Fall. EXAM: CT HEAD WITHOUT CONTRAST CT CERVICAL SPINE WITHOUT CONTRAST TECHNIQUE: Multidetector CT imaging of the head and cervical spine was performed following the standard protocol without intravenous contrast. Multiplanar CT image reconstructions of the cervical spine were also generated. COMPARISON:  Cervical spine x-rays dated January 11, 2018. CT head dated August 10, 2013. MRI cervical spine dated September 06, 2004. FINDINGS: CT HEAD FINDINGS Brain: No evidence of acute infarction, hemorrhage, hydrocephalus, extra-axial collection or mass lesion/mass effect. Small old infarcts in the right thalamus and right occipital lobe again noted. Vascular: Atherosclerotic vascular calcification of the carotid siphons. No hyperdense vessel. Skull: Normal. Negative for fracture or focal lesion. Sinuses/Orbits: No acute finding. Chronic bilateral maxillary sinus disease. Other: None. CT CERVICAL SPINE FINDINGS Alignment: Normal. Skull base and vertebrae: No acute fracture.  No primary bone lesion or focal pathologic process. Soft tissues and spinal canal: No prevertebral fluid or swelling. No visible canal hematoma. Disc levels: Prior C4-C6 ACDF with solid interbody fusion. Moderate to severe adjacent segment degenerative disc disease at C6-C7. Mild degenerative disc disease at C2-C3 and C3-C4. Diffuse moderate to severe facet arthropathy throughout the cervical spine. Upper chest: Negative. Other: None. IMPRESSION: 1. No acute intracranial abnormality. Small old infarcts in the right thalamus and right occipital lobe. 2. No acute cervical spine fracture. Prior C4-C6 ACDF with moderate to severe adjacent segment disease at C6-C7. Electronically Signed   By: Titus Dubin M.D.   On: 10/23/2018 23:07   Ct Cervical Spine Wo Contrast  Result Date: 10/23/2018 CLINICAL DATA:  Fall. EXAM: CT HEAD WITHOUT CONTRAST CT CERVICAL SPINE WITHOUT CONTRAST TECHNIQUE: Multidetector CT imaging of the head and cervical spine was performed following the standard protocol without intravenous contrast. Multiplanar CT image reconstructions of the cervical spine were also generated. COMPARISON:  Cervical spine x-rays dated January 11, 2018. CT head dated August 10, 2013. MRI cervical spine dated September 06, 2004. FINDINGS: CT HEAD FINDINGS Brain: No evidence of acute infarction, hemorrhage, hydrocephalus, extra-axial collection or mass lesion/mass effect. Small old infarcts in the right thalamus and right occipital lobe again noted. Vascular: Atherosclerotic vascular calcification of the carotid siphons. No hyperdense vessel. Skull: Normal. Negative for fracture or focal lesion. Sinuses/Orbits: No acute finding. Chronic bilateral maxillary sinus disease. Other: None. CT CERVICAL SPINE FINDINGS Alignment: Normal. Skull base and vertebrae: No acute fracture. No primary  bone lesion or focal pathologic process. Soft tissues and spinal canal: No prevertebral fluid or swelling. No visible canal hematoma. Disc  levels: Prior C4-C6 ACDF with solid interbody fusion. Moderate to severe adjacent segment degenerative disc disease at C6-C7. Mild degenerative disc disease at C2-C3 and C3-C4. Diffuse moderate to severe facet arthropathy throughout the cervical spine. Upper chest: Negative. Other: None. IMPRESSION: 1. No acute intracranial abnormality. Small old infarcts in the right thalamus and right occipital lobe. 2. No acute cervical spine fracture. Prior C4-C6 ACDF with moderate to severe adjacent segment disease at C6-C7. Electronically Signed   By: Titus Dubin M.D.   On: 10/23/2018 23:07   Dg Hip Unilat With Pelvis 2-3 Views Left  Result Date: 10/23/2018 CLINICAL DATA:  Fall with left-sided hip pain EXAM: DG HIP (WITH OR WITHOUT PELVIS) 2-3V LEFT COMPARISON:  CT 07/03/2016 FINDINGS: SI joints are non widened. Pubic symphysis and rami appear intact. Prostate seeds are noted. Right femoral head projects in joint. Moderate degenerative changes of the right hip. Acute left femoral neck fracture with mild cephalad migration of the trochanter. Left femoral head projects in joint. IMPRESSION: Acute mildly displaced left femoral neck fracture Electronically Signed   By: Donavan Foil M.D.   On: 10/23/2018 22:30    EKG: Independently reviewed.  EKG revealed a sinus rhythm.  He had frequent atrial premature contractions.  He has a left anterior fascicular block.  No acute changes are noted.  Is unchanged from his most recent study.  Assessment/Plan Active Problems:   Hip fracture (HCC)   HYPERTENSION, BENIGN ESSENTIAL   Cerebral artery occlusion with cerebral infarction [facility])   Hip fracture, left ([place])  (please populate well all problems here in Problem List. (For example, if patient is on BP meds at home and you resume or decide to hold them, it is a problem that needs to be her. Same for CAD, COPD, HLD and so on)   1.  Hip fracture left -patient with a mildly displaced sure of the left femoral neck.  He is  already been seen by orthopedics and is going to be scheduled for hip replacement. Plan admit to orthopedic floor.  Pain management using ketorolac Tylenol and morphine if needed  Eliquis is discontinued.  Patient to be covered by Lovenox.  Regular diet since surgery will be delayed more than 24 hours  2.  Hypertension -patient with significant hypertension that is requiring multiple medications.   Plan  Continue home medications  3. H/O CVA - patient has h/o PAF and has had CVA 2001. He has been on Eliquis. Plan  D/c Eliquis for upcoming hip repair  4. Diastolic dysfunction - 2D echo 09/08/18 revealed LVEF >65%. Impaired relaxation LV noted. Patient had been prescribed Lasix daily which he does not take. No evidence of decompensation or CHF on exam Plan Will d/c Lasix order       DVT prophylaxis: lovenox (Lovenox/Heparin/SCD's/anticoagulated/None (if comfort care) Code Status: full code: did discuss with patient/wife/daughter.referred to [email or web address] (Full/Partial (specify details) Family Communication: wife and daughter present and aprised of tx plan (Specify name, relationship. Do not write "discussed with patient". Specify tel # if discussed over the phone) Disposition Plan: home when stable after THR (specify when and where you expect patient to be discharged) Consults called: Ortho - Dr. Gladstone Lighter ( Dr. Alvan Dame will see patient in AM) (with names) Admission status: inpatient (inpatient / obs / tele / medical floor / SDU)   Adella Hare MD [facility] Pager  [phone]  If 7PM-7AM, please contact night-coverage [email or web address] Password [facility]  10/24/2018, 12:24 AM

## 2018-10-24 NOTE — Transfer of Care (Signed)
Immediate Anesthesia Transfer of Care Note  Patient: [name]  Procedure(s) Performed: ARTHROPLASTY BIPOLAR HIP (HEMIARTHROPLASTY) (Left Hip)  Patient Location: PACU  Anesthesia Type:General  Level of Consciousness: drowsy and pateint uncooperative  Airway & Oxygen Therapy: Patient Spontanous Breathing  Post-op Assessment: Report given to RN and Post -op Vital signs reviewed and stable  Post vital signs: Reviewed and stable  Last Vitals:  Vitals Value Taken Time  BP    Temp    Pulse 78 10/24/18 1827  Resp 12 10/24/18 1827  SpO2 93 % 10/24/18 1827  Vitals shown include unvalidated device data.  Last Pain:  Vitals:   10/24/18 1353  TempSrc: Oral  PainSc: 0-No pain      Patients Stated Pain Goal: 5 (38/46/65 [id number])  Complications: No apparent anesthesia complications

## 2018-10-24 NOTE — Progress Notes (Signed)
  PROGRESS NOTE  Patient admitted earlier this morning. See H&P. [name] is a 85 y.o. male with medical history significant of hypertension, GERD, mild asthma, and previous stroke. He is very active at baseline.  He was at home and was getting up some food and then tripped over his own feet falling down onto his left side.  He had immediate onset of pain in the left hip area and was unable to stand or bear weight.  Because of the pain and unable to bear weight he presented to the emergency department for evaluation. CT scan of the head was negative for injury.  Patient did have x-ray of the left hip and pelvis revealing a displaced fracture of the left femoral neck. Orthopedic surgery consulted.   Patient seen with family member at bedside.  He has no acute complaints.  He was able to sleep after getting morphine last night.  Left hip fracture status post mechanical fall -CT head/cervical spine without acute abnormality  -Pain control -Orthopedic surgery consulted -Hold Eliquis -RCRI class III risk which correlates with 10.1% of 30-day risk of death, MI, or cardiac arrest   Essential hypertension -Continue Norvasc, clonidine, Toprol, minoxidil  History of CVA -Hold Eliquis -Continue lipitor   Paroxysmal atrial fibrillation -Hold Eliquis  Chronic diastolic heart failure -No evidence of decompensation on examination   Scott Phi, DO [facility] 10/24/2018, 10:15 AM  Available via Epic secure chat 7am-7pm After these hours, please refer to coverage provider listed on [email or web address]

## 2018-10-25 ENCOUNTER — Encounter (HOSPITAL_COMMUNITY): Payer: Self-pay | Admitting: Orthopedic Surgery

## 2018-10-25 LAB — BASIC METABOLIC PANEL
Anion gap: 10 (ref 5–15)
BUN: 28 mg/dL — ABNORMAL HIGH (ref 8–23)
CO2: 24 mmol/L (ref 22–32)
Calcium: 8.3 mg/dL — ABNORMAL LOW (ref 8.9–10.3)
Chloride: 103 mmol/L (ref 98–111)
Creatinine, Ser: 1.42 mg/dL — ABNORMAL HIGH (ref 0.61–1.24)
GFR calc Af Amer: 51 mL/min — ABNORMAL LOW (ref >60)
GFR calc non Af Amer: 44 mL/min — ABNORMAL LOW (ref >60)
Glucose, Bld: 268 mg/dL — ABNORMAL HIGH (ref 70–99)
Potassium: 4.8 mmol/L (ref 3.5–5.1)
Sodium: 137 mmol/L (ref 135–145)

## 2018-10-25 LAB — CBC
HCT: 29.1 % — ABNORMAL LOW (ref 39.0–52.0)
Hemoglobin: 9.8 g/dL — ABNORMAL LOW (ref 13.0–17.0)
MCH: 30.2 pg (ref 26.0–34.0)
MCHC: 33.7 g/dL (ref 30.0–36.0)
MCV: 89.5 fL (ref 80.0–100.0)
Platelets: 189 10*3/uL (ref 150–400)
RBC: 3.25 MIL/uL — ABNORMAL LOW (ref 4.22–5.81)
RDW: 13.9 % (ref 11.5–15.5)
WBC: 11 10*3/uL — ABNORMAL HIGH (ref 4.0–10.5)
nRBC: 0 % (ref 0.0–0.2)

## 2018-10-25 MED ORDER — DOCUSATE SODIUM 100 MG PO CAPS: 100.0000 mg | ORAL_CAPSULE | Freq: Two times a day (BID) | ORAL | 0 refills | Status: DC

## 2018-10-25 MED ORDER — METHOCARBAMOL 500 MG PO TABS: 500.0000 mg | ORAL_TABLET | Freq: Four times a day (QID) | ORAL | 0 refills | Status: DC | PRN

## 2018-10-25 MED ORDER — MAGNESIUM HYDROXIDE 400 MG/5ML PO SUSP
15.0000 mL | Freq: Every day | ORAL | Status: DC | PRN
  Administered 2018-10-25 – 2018-10-26 (×2): 15 mL via ORAL
  Filled 2018-10-25 (×2): qty 30

## 2018-10-25 MED ORDER — SODIUM CHLORIDE 0.9 % IV SOLN
INTRAVENOUS | Status: AC
  Administered 2018-10-25 (×2): via INTRAVENOUS

## 2018-10-25 MED ORDER — TRAMADOL HCL 50 MG PO TABS: 50.0000 mg | ORAL_TABLET | Freq: Four times a day (QID) | ORAL | 0 refills | Status: DC | PRN

## 2018-10-25 MED ORDER — FERROUS SULFATE 325 (65 FE) MG PO TABS: 325.0000 mg | ORAL_TABLET | Freq: Three times a day (TID) | ORAL | 0 refills | Status: DC

## 2018-10-25 MED ORDER — POLYETHYLENE GLYCOL 3350 17 G PO PACK: 17.0000 g | PACK | Freq: Two times a day (BID) | ORAL | 0 refills | Status: DC

## 2018-10-25 MED FILL — SM CLEARLAX POWDER: 17 | 15 days supply | Qty: 510 | Fill #0

## 2018-10-25 MED FILL — DOK 100 MG SOFTGEL: 100 | 14 days supply | Qty: 28 | Fill #0

## 2018-10-25 MED FILL — FERROUS SULFATE 325 MG TAB: 325 (65 FE) | 14 days supply | Qty: 42 | Fill #0

## 2018-10-25 NOTE — Progress Notes (Signed)
PROGRESS NOTE    [name]  FGH:[id number] DOB: May 18, 1929 DOA: 10/23/2018 PCP: [name], MD     Brief Narrative:  [name] [name] a 85 y.o.malewith medical history significant ofhypertension, GERD, mild asthma, and previous stroke. He is very active at baseline. He was at home and was getting up some food and then tripped over his own feet falling down onto his left side. He had immediate onset of pain in the left hip area and was unable to stand or bear weight.Because of the pain and unable to bear weight he presented to the emergency department for evaluation. CT scan of the head was negative for injury. Patient did have x-ray of the left hip and pelvis revealing a displaced fracture of the left femoral neck. Orthopedic surgery consulted.   Patient underwent left hip hemiarthroplasty with Dr. Alvan Dame 10/12.  New events last 24 hours / Subjective: Doing well this morning, no acute events overnight.  Pain well controlled today  Assessment & Plan:   Principal Problem:   Hip fracture, left (HCC) Active Problems:   HYPERTENSION, BENIGN ESSENTIAL   Cerebral artery occlusion with cerebral infarction [facility])   Hip fracture (HCC)   Left hip fracture status post mechanical fall -CT head/cervical spine without acute abnormality  -Status post left hip hemiarthroplasty with Dr. Alvan Dame 10/12 -Weight-bear as tolerated left lower extremity, PT OT evaluation pending -Resume Eliquis -Outpatient follow-up in 2 weeks for wound check  AKI on CKD 3  -Previous creatinine has ranged from 1-1.46 -Creatinine on admission was 1.09 --> elevated to 1.42 postoperatively -Start gentle fluids for the next 12 hours and trend BMP  Essential hypertension -Continue Norvasc, clonidine, Toprol, minoxidil  History of CVA -Resume Eliquis -Continue lipitor   Paroxysmal atrial fibrillation -Resume Eliquis  Chronic diastolic heart failure -No evidence of decompensation on examination -Patient  no longer taking Lasix as an outpatient   DVT prophylaxis: Resume Eliquis Code Status: Full code Family Communication: None Disposition Plan: Pending PT OT evaluation   Consultants:   Orthopedic surgery  Procedures:   Status post left hip hemiarthroplasty with Dr. Alvan Dame 10/12  Antimicrobials:  Anti-infectives (From admission, onward)   Start     Dose/Rate Route Frequency Ordered Stop   10/25/18 0600  ceFAZolin (ANCEF) IVPB 2g/100 mL premix     2 g 200 mL/hr over 30 Minutes Intravenous To Short Stay 10/24/18 2024 10/26/18 0600   10/24/18 2300  ceFAZolin (ANCEF) IVPB 2g/100 mL premix     2 g 200 mL/hr over 30 Minutes Intravenous Every 6 hours 10/24/18 2000 10/25/18 0830   10/24/18 1638  ceFAZolin (ANCEF) 2-4 GM/100ML-% IVPB    Note to Pharmacy: Henrine Screws   : cabinet override      10/24/18 1638 10/25/18 0444        Objective: Vitals:   10/24/18 2335 10/25/18 0337 10/25/18 0800 10/25/18 0812  BP: (!) 152/41 (!) 144/45 (!) 169/53   Pulse: 63 87 89   Resp: 17 17 17    Temp: 98.6 F (37 C) 98.9 F (37.2 C) 97.9 F (36.6 C)   TempSrc:  Oral Oral   SpO2: 98% 92% 100% 99%  Weight:      Height:        Intake/Output Summary (Last 24 hours) at 10/25/2018 1032 Last data filed at 10/25/2018 0400 Gross per 24 hour  Intake 1240 ml  Output 550 ml  Net 690 ml   Filed Weights   10/23/18 2153  Weight: 74.8 kg  Examination:  General exam: Appears calm and comfortable  Respiratory system: Clear to auscultation. Respiratory effort normal. No respiratory distress. No conversational dyspnea.  Cardiovascular system: S1 & S2 heard. No murmurs. No pedal edema. Gastrointestinal system: Abdomen is nondistended, soft and nontender. Normal bowel sounds heard. Central nervous system: Alert and oriented. No focal neurological deficits.  Extremities: Symmetric in appearance  Skin: No rashes, lesions or ulcers on exposed skin  Psychiatry: Judgement and insight appear normal. Mood  & affect appropriate.   Data Reviewed: I have personally reviewed following labs and imaging studies  CBC: Recent Labs  Lab 10/23/18 2300 10/25/18 0156  WBC 9.7 11.0*  NEUTROABS 8.0*  --   HGB 12.2* 9.8*  HCT 35.2* 29.1*  MCV 89.1 89.5  PLT 245 867   Basic Metabolic Panel: Recent Labs  Lab 10/23/18 2300 10/25/18 0156  NA 141 137  K 4.8 4.8  CL 106 103  CO2 26 24  GLUCOSE 118* 268*  BUN 22 28*  CREATININE 1.09 1.42*  CALCIUM 8.8* 8.3*   GFR: Estimated Creatinine Clearance: 38.8 mL/min (A) (by C-G formula based on SCr of 1.42 mg/dL (H)). Liver Function Tests: No results for input(s): AST, ALT, ALKPHOS, BILITOT, PROT, ALBUMIN in the last 168 hours. No results for input(s): LIPASE, AMYLASE in the last 168 hours. No results for input(s): AMMONIA in the last 168 hours. Coagulation Profile: Recent Labs  Lab 10/23/18 2300  INR 1.3*   Cardiac Enzymes: No results for input(s): CKTOTAL, CKMB, CKMBINDEX, TROPONINI in the last 168 hours. BNP (last 3 results) Recent Labs    09/07/18 0948  PROBNP 1,352*   HbA1C: No results for input(s): HGBA1C in the last 72 hours. CBG: No results for input(s): GLUCAP in the last 168 hours. Lipid Profile: No results for input(s): CHOL, HDL, LDLCALC, TRIG, CHOLHDL, LDLDIRECT in the last 72 hours. Thyroid Function Tests: No results for input(s): TSH, T4TOTAL, FREET4, T3FREE, THYROIDAB in the last 72 hours. Anemia Panel: No results for input(s): VITAMINB12, FOLATE, FERRITIN, TIBC, IRON, RETICCTPCT in the last 72 hours. Sepsis Labs: No results for input(s): PROCALCITON, LATICACIDVEN in the last 168 hours.  Recent Results (from the past 240 hour(s))  SARS CORONAVIRUS 2 (TAT 6-24 HRS) Nasopharyngeal Nasopharyngeal Swab     Status: None   Collection Time: 10/23/18 11:20 PM   Specimen: Nasopharyngeal Swab  Result Value Ref Range Status   SARS Coronavirus 2 NEGATIVE NEGATIVE Final    Comment: (NOTE) SARS-CoV-2 target nucleic acids are  NOT DETECTED. The SARS-CoV-2 RNA is generally detectable in upper and lower respiratory specimens during the acute phase of infection. Negative results do not preclude SARS-CoV-2 infection, do not rule out co-infections with other pathogens, and should not be used as the sole basis for treatment or other patient management decisions. Negative results must be combined with clinical observations, patient history, and epidemiological information. The expected result is Negative. Fact Sheet for Patients: SugarRoll.be Fact Sheet for Healthcare Providers: https://www.woods-mathews.com/ This test is not yet approved or cleared by the [place] FDA and  has been authorized for detection and/or diagnosis of SARS-CoV-2 by FDA under an Emergency Use Authorization (EUA). This EUA will remain  in effect (meaning this test can be used) for the duration of the COVID-19 declaration under Section 56 4(b)(1) of the Act, 21 U.S.C. section 360bbb-3(b)(1), unless the authorization is terminated or revoked sooner. Performed at [facility], [place] [address], [place], [place] [address]   Surgical pcr screen  Status: None   Collection Time: 10/24/18  1:59 PM   Specimen: Nasal Mucosa; Nasal Swab  Result Value Ref Range Status   MRSA, PCR NEGATIVE NEGATIVE Final   Staphylococcus aureus NEGATIVE NEGATIVE Final    Comment: (NOTE) The Xpert SA Assay (FDA approved for NASAL specimens in patients 84 years of age and older), is one component of a comprehensive surveillance program. It is not intended to diagnose infection nor to guide or monitor treatment. Performed at [facility], [place] [address] [place], [place] [address]       Radiology Studies: Dg Chest 1 View  Result Date: 10/23/2018 CLINICAL DATA:  Hip fracture EXAM: CHEST  1 VIEW COMPARISON:  03/04/2018 FINDINGS: Patchy scarring or atelectasis at the lingula. No pleural effusion.  Borderline to mild cardiomegaly with aortic atherosclerosis. No pneumothorax. IMPRESSION: No active disease.  Patchy atelectasis or scar at the lingula Electronically Signed   By: Donavan Foil M.D.   On: 10/23/2018 22:31   Ct Head Wo Contrast  Result Date: 10/23/2018 CLINICAL DATA:  Fall. EXAM: CT HEAD WITHOUT CONTRAST CT CERVICAL SPINE WITHOUT CONTRAST TECHNIQUE: Multidetector CT imaging of the head and cervical spine was performed following the standard protocol without intravenous contrast. Multiplanar CT image reconstructions of the cervical spine were also generated. COMPARISON:  Cervical spine x-rays dated January 11, 2018. CT head dated August 10, 2013. MRI cervical spine dated September 06, 2004. FINDINGS: CT HEAD FINDINGS Brain: No evidence of acute infarction, hemorrhage, hydrocephalus, extra-axial collection or mass lesion/mass effect. Small old infarcts in the right thalamus and right occipital lobe again noted. Vascular: Atherosclerotic vascular calcification of the carotid siphons. No hyperdense vessel. Skull: Normal. Negative for fracture or focal lesion. Sinuses/Orbits: No acute finding. Chronic bilateral maxillary sinus disease. Other: None. CT CERVICAL SPINE FINDINGS Alignment: Normal. Skull base and vertebrae: No acute fracture. No primary bone lesion or focal pathologic process. Soft tissues and spinal canal: No prevertebral fluid or swelling. No visible canal hematoma. Disc levels: Prior C4-C6 ACDF with solid interbody fusion. Moderate to severe adjacent segment degenerative disc disease at C6-C7. Mild degenerative disc disease at C2-C3 and C3-C4. Diffuse moderate to severe facet arthropathy throughout the cervical spine. Upper chest: Negative. Other: None. IMPRESSION: 1. No acute intracranial abnormality. Small old infarcts in the right thalamus and right occipital lobe. 2. No acute cervical spine fracture. Prior C4-C6 ACDF with moderate to severe adjacent segment disease at C6-C7.  Electronically Signed   By: Titus Dubin M.D.   On: 10/23/2018 23:07   Ct Cervical Spine Wo Contrast  Result Date: 10/23/2018 CLINICAL DATA:  Fall. EXAM: CT HEAD WITHOUT CONTRAST CT CERVICAL SPINE WITHOUT CONTRAST TECHNIQUE: Multidetector CT imaging of the head and cervical spine was performed following the standard protocol without intravenous contrast. Multiplanar CT image reconstructions of the cervical spine were also generated. COMPARISON:  Cervical spine x-rays dated January 11, 2018. CT head dated August 10, 2013. MRI cervical spine dated September 06, 2004. FINDINGS: CT HEAD FINDINGS Brain: No evidence of acute infarction, hemorrhage, hydrocephalus, extra-axial collection or mass lesion/mass effect. Small old infarcts in the right thalamus and right occipital lobe again noted. Vascular: Atherosclerotic vascular calcification of the carotid siphons. No hyperdense vessel. Skull: Normal. Negative for fracture or focal lesion. Sinuses/Orbits: No acute finding. Chronic bilateral maxillary sinus disease. Other: None. CT CERVICAL SPINE FINDINGS Alignment: Normal. Skull base and vertebrae: No acute fracture. No primary bone lesion or focal pathologic process. Soft tissues and spinal canal: No prevertebral fluid  or swelling. No visible canal hematoma. Disc levels: Prior C4-C6 ACDF with solid interbody fusion. Moderate to severe adjacent segment degenerative disc disease at C6-C7. Mild degenerative disc disease at C2-C3 and C3-C4. Diffuse moderate to severe facet arthropathy throughout the cervical spine. Upper chest: Negative. Other: None. IMPRESSION: 1. No acute intracranial abnormality. Small old infarcts in the right thalamus and right occipital lobe. 2. No acute cervical spine fracture. Prior C4-C6 ACDF with moderate to severe adjacent segment disease at C6-C7. Electronically Signed   By: Titus Dubin M.D.   On: 10/23/2018 23:07   Pelvis Portable  Result Date: 10/24/2018 CLINICAL DATA:  Post left hip  arthroplasty. EXAM: PORTABLE PELVIS 1-2 VIEWS COMPARISON:  None. FINDINGS: Patient is post left hip hemiarthroplasty with expected alignment of the native acetabulum and the articulating femoral component. There is a moderate to large joint effusion and intra-articular gas, both expected changes in the postoperative state. Additional overlying postsurgical skin changes are noted as well. Moderate degenerative features are noted in the right hip. Bones of the pelvis remain congruent. Prostate brachytherapy implants are present in the mid pelvis. Vascular calcium noted in the soft tissues. IMPRESSION: Status post left hip hemiarthroplasty without evidence of acute hardware complication. Moderate to large joint effusion and intra-articular gas, both expected changes in the immediate postoperative state. Electronically Signed   By: Lovena Le M.D.   On: 10/24/2018 22:27   Dg Hip Unilat With Pelvis 2-3 Views Left  Result Date: 10/23/2018 CLINICAL DATA:  Fall with left-sided hip pain EXAM: DG HIP (WITH OR WITHOUT PELVIS) 2-3V LEFT COMPARISON:  CT 07/03/2016 FINDINGS: SI joints are non widened. Pubic symphysis and rami appear intact. Prostate seeds are noted. Right femoral head projects in joint. Moderate degenerative changes of the right hip. Acute left femoral neck fracture with mild cephalad migration of the trochanter. Left femoral head projects in joint. IMPRESSION: Acute mildly displaced left femoral neck fracture Electronically Signed   By: Donavan Foil M.D.   On: 10/23/2018 22:30      Scheduled Meds:  amLODipine  5 mg Oral Daily   apixaban  5 mg Oral BID   atorvastatin  40 mg Oral q1800   budesonide  0.25 mg Inhalation BID   chlorhexidine  60 mL Topical Once   cloNIDine  0.1 mg Oral Q8H   docusate sodium  100 mg Oral BID   ferrous sulfate  325 mg Oral TID PC   gabapentin  300 mg Oral TID   metoprolol succinate  12.5 mg Oral Daily   minoxidil  10 mg Oral BID   mupirocin ointment    Topical BID   pantoprazole  40 mg Oral QAC breakfast   povidone-iodine  2 application Topical Once   povidone-iodine  2 application Topical Once   Continuous Infusions:  sodium chloride 100 mL/hr at 10/25/18 0756    ceFAZolin (ANCEF) IV     lactated ringers 10 mL/hr at 10/24/18 1639   methocarbamol (ROBAXIN) IV     tranexamic acid       LOS: 2 days      Time spent: 25 minutes   Dessa Phi, DO [facility] 10/25/2018, 10:32 AM   Available via Epic secure chat 7am-7pm After these hours, please refer to coverage provider listed on [email or web address]

## 2018-10-25 NOTE — Evaluation (Signed)
Physical Therapy Evaluation Patient Details Name: [name] MRN: [id number] DOB: 06-05-1929 Today's Date: 10/25/2018   History of Present Illness  [name] is a 85 y.o. male with medical history significant of hypertension GERD mild asthma and previous stroke.  Is very active.  He was at home and was getting up some food and tripped over his own feet falling down onto his left side. Now s/p L hip hemiarthroplasty, Posterior Approach, WBAT  Clinical Impression   Patient is s/p above surgery resulting in functional limitations due to the deficits listed below (see PT Problem List). Independent prior to admission; presents with decr functional mobility, decr knowledge of precautions and DME; He is very motivated to get better, his age bolsters the case for medical complexity, and he has a very supportive family; will place CIR screen;  Patient will benefit from skilled PT to increase their independence and safety with mobility to allow discharge to the venue listed below.       Follow Up Recommendations CIR    Equipment Recommendations  Rolling walker with 5" wheels;3in1 (PT)    Recommendations for Other Services OT consult(as ordered)     Precautions / Restrictions Precautions Precautions: Fall;Posterior Hip Precaution Booklet Issued: Yes (comment) Precaution Comments: Reviewed posterior hip precautions with pt and daughter. Pt able to recall 2/3 precautions by end of session with Min cues.       Mobility  Bed Mobility Overal bed mobility: Needs Assistance Bed Mobility: Supine to Sit     Supine to sit: Mod assist     General bed mobility comments: Cues for technqiue and heavy mod assist to elevate trunk to sit; close guard for prec due to tendency to internally rotate L hip  Transfers Overall transfer level: Needs assistance Equipment used: Rolling walker (2 wheeled) Transfers: Sit to/from Stand Sit to Stand: Mod assist;+2 safety/equipment         General  transfer comment: Mod A for power up into standing and then gaining balance. cues for positioning of LLE. +2 for safety  Ambulation/Gait Ambulation/Gait assistance: Min assist;+2 safety/equipment Gait Distance (Feet): 8 Feet Assistive device: Rolling walker (2 wheeled) Gait Pattern/deviations: Step-to pattern     General Gait Details: Cues for gait sequence; good use of RW to unweigh painful L hip in stance  Stairs            Wheelchair Mobility    Modified Rankin (Stroke Patients Only)       Balance Overall balance assessment: Needs assistance Sitting-balance support: No upper extremity supported;Feet supported Sitting balance-Leahy Scale: Fair     Standing balance support: Bilateral upper extremity supported;During functional activity Standing balance-Leahy Scale: Poor                               Pertinent Vitals/Pain Pain Assessment: Faces Faces Pain Scale: Hurts even more Pain Location: LLE Pain Descriptors / Indicators: Constant;Discomfort;Grimacing Pain Intervention(s): Limited activity within patient's tolerance;RN gave pain meds during session    [place] expects to be discharged to:: Private residence Living Arrangements: Spouse/significant other(he is caregiver for her) Available Help at Discharge: Family;Available 24 hours/day;Other (Comment)(adult children will assist) Type of Home: House Home Access: Stairs to enter   Entrance Stairs-Number of Steps: 4 Home Layout: One level Home Equipment: [place] - 2 wheels;Shower seat;Bedside commode      Prior Function Level of Independence: Independent         Comments:  ADLs, IADLs, and enjoys working in the yard     [employer]        Extremity/Trunk Assessment   Upper Extremity Assessment Upper Extremity Assessment: Defer to OT evaluation    Lower Extremity Assessment Lower Extremity Assessment: LLE deficits/detail LLE Deficits / Details: Grossly decr  AROM and strength, limited by pain postop; Tends to keep hip slightly internally rotated -- will need close guard LLE: Unable to fully assess due to pain    Cervical / Trunk Assessment Cervical / Trunk Assessment: Normal  Communication   Communication: HOH  Cognition Arousal/Alertness: Awake/alert Behavior During Therapy: WFL for tasks assessed/performed Overall Cognitive Status: Impaired/Different from baseline Area of Impairment: Memory                     Memory: Decreased short-term memory;Decreased recall of precautions         General Comments: Pt with decreased recall of precautions. Very motivated to participate in therapy despite pain      General Comments General comments (skin integrity, edema, etc.): SpO2 93% on RA. Daughter present throughout session    Exercises     Assessment/Plan    PT Assessment Patient needs continued PT services  PT Problem List Decreased strength;Decreased range of motion;Decreased activity tolerance;Decreased balance;Decreased mobility;Decreased coordination;Decreased cognition;Decreased safety awareness;Decreased knowledge of use of DME;Decreased knowledge of precautions       PT Treatment Interventions DME instruction;Gait training;Stair training;Functional mobility training;Therapeutic activities;Therapeutic exercise;Balance training;Cognitive remediation;Patient/family education    PT Goals (Current goals can be found in the Care Plan section)  Acute Rehab PT Goals Patient Stated Goal: "Go home to my wife" PT Goal Formulation: With patient Time For Goal Achievement: 11/08/18 Potential to Achieve Goals: Good    Frequency Min 5X/week   Barriers to discharge        Co-evaluation PT/OT/SLP Co-Evaluation/Treatment: Yes([place]) Reason for Co-Treatment: For patient/therapist safety PT goals addressed during session: Mobility/safety with mobility OT goals addressed during session: ADL's and self-care       AM-PAC  PT "6 Clicks" Mobility  Outcome Measure Help needed turning from your back to your side while in a flat bed without using bedrails?: A Lot Help needed moving from lying on your back to sitting on the side of a flat bed without using bedrails?: A Lot Help needed moving to and from a bed to a chair (including a wheelchair)?: A Lot Help needed standing up from a chair using your arms (e.g., wheelchair or bedside chair)?: A Lot Help needed to walk in hospital room?: A Little Help needed climbing 3-5 steps with a railing? : A Lot 6 Click Score: 13    End of Session Equipment Utilized During Treatment: Gait belt Activity Tolerance: Patient tolerated treatment well Patient left: in chair;with call bell/phone within reach;Other (comment)(Working with OT) Nurse Communication: Mobility status PT Visit Diagnosis: Other abnormalities of gait and mobility (R26.89);Muscle weakness (generalized) (M62.81);Pain Pain - Right/Left: Left Pain - part of body: Hip    Time: 2694-8546 PT Time Calculation (min) (ACUTE ONLY): 34 min   Charges:   PT Evaluation $PT Eval Moderate Complexity: 1 Mod PT Treatments $Gait Training: 8-22 mins        Roney Marion, PT  Acute Rehabilitation Services Pager [phone] Office [place] 10/25/2018, 3:59 PM

## 2018-10-25 NOTE — Evaluation (Signed)
Occupational Therapy Evaluation Patient Details Name: [name] MRN: [id number] DOB: 1929-02-24 Today's Date: 10/25/2018    History of Present Illness [name] is a 85 y.o. male with medical history significant of hypertension GERD mild asthma and previous stroke.  Is very active.  He was at home and was getting up some food and tripped over his own feet falling down onto his left side. Now s/p L hip hemiarthroplasty, Posterior Approach, WBAT   Clinical Impression   PTA, pt was living with his wife and was independent and very active. Pt currently requiring Min A for UB ADLs, Mod-Max A for LB ADLs, and Mod A +2 for functional mobility with RW. Pt presenting with decreased balance, strength, and recall of posterior hip precautions. Pt highly motivated to participate in therapy and has good family support. Pt would benefit from further acute OT to facilitate safe dc. Recommend dc to CIR for intensive OT to optimize safety, independence with ADLs, and return to PLOF as well as decrease caregiver support.     Follow Up Recommendations  CIR;Supervision/Assistance - 24 hour    Equipment Recommendations  Other (comment)(Defer to next venue)    Recommendations for Other Services Rehab consult;PT consult     Precautions / Restrictions Precautions Precautions: Fall;Posterior Hip Precaution Booklet Issued: Yes (comment) Precaution Comments: Reviewed posterior hip precautions with pt and daughter. Pt able to recall 2/3 precautions by end of session with Min cues.       Mobility Bed Mobility               General bed mobility comments: Pt up with PT upon arrival.   Transfers Overall transfer level: Needs assistance Equipment used: Rolling walker (2 wheeled) Transfers: Sit to/from Stand Sit to Stand: Mod assist;+2 safety/equipment         General transfer comment: Mod A for power up into standing and then gaining balance. cues for positioning of LLE. +2 for safety     Balance Overall balance assessment: Needs assistance Sitting-balance support: No upper extremity supported;Feet supported Sitting balance-Leahy Scale: Fair     Standing balance support: Bilateral upper extremity supported;During functional activity Standing balance-Leahy Scale: Poor                             ADL either performed or assessed with clinical judgement   ADL Overall ADL's : Needs assistance/impaired Eating/Feeding: Set up;Sitting   Grooming: Set up;Sitting   Upper Body Bathing: Set up;Supervision/ safety;Sitting   Lower Body Bathing: Moderate assistance;Sit to/from stand   Upper Body Dressing : Set up;Supervision/safety;Sitting   Lower Body Dressing: Maximal assistance;Sit to/from stand   Toilet Transfer: Moderate assistance;+2 for safety/equipment;Ambulation;RW(simulated to recliner) Toilet Transfer Details (indicate cue type and reason): Mod A to power up into standing and then gain balance         Functional mobility during ADLs: Moderate assistance;+2 for safety/equipment;Rolling walker;Cueing for sequencing General ADL Comments: Pt presenting with decreased balance, strength, and recall of precautions. Pt with high motivation.     Vision         Perception     Praxis      Pertinent Vitals/Pain Pain Assessment: Faces Faces Pain Scale: Hurts even more Pain Location: LLE Pain Descriptors / Indicators: Constant;Discomfort;Grimacing Pain Intervention(s): Limited activity within patient's tolerance;Monitored during session;Repositioned     Hand Dominance     Extremity/Trunk Assessment Upper Extremity Assessment Upper Extremity Assessment: Overall WFL for tasks assessed  Lower Extremity Assessment Lower Extremity Assessment: Defer to PT evaluation   Cervical / Trunk Assessment Cervical / Trunk Assessment: Normal   Communication Communication Communication: HOH   Cognition Arousal/Alertness: Awake/alert Behavior During  Therapy: WFL for tasks assessed/performed Overall Cognitive Status: Impaired/Different from baseline Area of Impairment: Memory                     Memory: Decreased short-term memory;Decreased recall of precautions         General Comments: Pt with decreased recall of precautions. Very motivated to participate in therapy despite pain   General Comments  SpO2 93% on RA. Daughter present throughout session    Exercises     Shoulder Instructions      [place] expects to be discharged to:: Private residence Living Arrangements: Spouse/significant other Available Help at Discharge: Family;Available 24 hours/day Type of Home: House Home Access: Stairs to enter [employer] of Steps: 4   Home Layout: One level     Bathroom Shower/Tub: Occupational psychologist: Handicapped height     Home Equipment: Environmental consultant - 2 wheels;Shower seat;Bedside commode          Prior Functioning/Environment Level of Independence: Independent        Comments: ADLs, IADLs, and enjoys working in the yard        OT Problem List: Decreased strength;Decreased range of motion;Decreased activity tolerance;Impaired balance (sitting and/or standing);Decreased safety awareness;Decreased knowledge of use of DME or AE;Decreased knowledge of precautions;Pain      OT Treatment/Interventions: Self-care/ADL training;Therapeutic exercise;Energy conservation;DME and/or AE instruction;Therapeutic activities;Patient/family education    OT Goals(Current goals can be found in the care plan section) Acute Rehab OT Goals Patient Stated Goal: "Go home to my wife" OT Goal Formulation: With patient/family Time For Goal Achievement: 11/08/18 Potential to Achieve Goals: Good  OT Frequency: Min 3X/week   Barriers to D/C:            Co-evaluation PT/OT/SLP Co-Evaluation/Treatment: Yes(Dove tail) Reason for Co-Treatment: For patient/therapist safety;To address  functional/ADL transfers   OT goals addressed during session: ADL's and self-care      AM-PAC OT "6 Clicks" Daily Activity     Outcome Measure Help from another person eating meals?: None Help from another person taking care of personal grooming?: A Little Help from another person toileting, which includes using toliet, bedpan, or urinal?: A Lot Help from another person bathing (including washing, rinsing, drying)?: A Lot Help from another person to put on and taking off regular upper body clothing?: A Little Help from another person to put on and taking off regular lower body clothing?: A Lot 6 Click Score: 16   End of Session Equipment Utilized During Treatment: Gait belt;Rolling walker Nurse Communication: Mobility status;Precautions  Activity Tolerance: Patient tolerated treatment well Patient left: in chair;with call bell/phone within reach;with chair alarm set  OT Visit Diagnosis: Unsteadiness on feet (R26.81);Other abnormalities of gait and mobility (R26.89);Muscle weakness (generalized) (M62.81);Other symptoms and signs involving cognitive function;Pain Pain - Right/Left: Left Pain - part of body: Hip                Time: 8676-7209 OT Time Calculation (min): 27 min Charges:  OT General Charges $OT Visit: 1 Visit OT Evaluation $OT Eval Moderate Complexity: [place], OTR/L Acute Rehab Pager: [phone] Office: [place] 10/25/2018, 3:39 PM

## 2018-10-25 NOTE — Progress Notes (Signed)
Rehab Admissions Coordinator Note:  Patient was screened by Michel Santee for appropriateness for an Inpatient Acute Rehab Consult.  At this time, we are recommending Inpatient Rehab consult.  Michel Santee 10/25/2018, 4:28 PM  I can be reached at [phone].

## 2018-10-25 NOTE — Progress Notes (Signed)
Patient ID: [name], male   DOB: Sep 15, 1929, 85 y.o.   MRN: [id number] Subjective: 1 Day Post-Op Procedure(s) (LRB): ARTHROPLASTY BIPOLAR HIP (HEMIARTHROPLASTY) (Left)    Patient reports pain as moderate.  Sleeping this am but reports that hip is "sore". No events reported or otherwise noted  Objective:   VITALS:   Vitals:   10/24/18 2335 10/25/18 0337  BP: (!) 152/41 (!) 144/45  Pulse: 63 87  Resp: 17 17  Temp: 98.6 F (37 C) 98.9 F (37.2 C)  SpO2: 98% 92%    Neurovascular intact Incision: dressing C/D/I  LABS Recent Labs    10/23/18 2300 10/25/18 0156  HGB 12.2* 9.8*  HCT 35.2* 29.1*  WBC 9.7 11.0*  PLT 245 189    Recent Labs    10/23/18 2300 10/25/18 0156  NA 141 137  K 4.8 4.8  BUN 22 28*  CREATININE 1.09 1.42*  GLUCOSE 118* 268*    Recent Labs    10/23/18 2300  INR 1.3*     Assessment/Plan: 1 Day Post-Op Procedure(s) (LRB): ARTHROPLASTY BIPOLAR HIP (HEMIARTHROPLASTY) (Left)   Advance diet Up with therapy   WBAT LLE Posterior hip precautions  Hopeful discharge to home after couple days of in patient therapy for gait and confidence for safe home discharge On eliquis pre-op, resume today SCDs Norco for pain  RTC in 2 weeks for wound check

## 2018-10-25 NOTE — Discharge Instructions (Signed)
INSTRUCTIONS AFTER JOINT REPLACEMENT   o Remove items at home which could result in a fall. This includes throw rugs or furniture in walking pathways o ICE to the affected joint every three hours while awake for 30 minutes at a time, for at least the first 3-5 days, and then as needed for pain and swelling.  Continue to use ice for pain and swelling. You may notice swelling that will progress down to the foot and ankle.  This is normal after surgery.  Elevate your leg when you are not up walking on it.   o Continue to use the breathing machine you got in the hospital (incentive spirometer) which will help keep your temperature down.  It is common for your temperature to cycle up and down following surgery, especially at night when you are not up moving around and exerting yourself.  The breathing machine keeps your lungs expanded and your temperature down.   DIET:  As you were doing prior to hospitalization, we recommend a well-balanced diet.  DRESSING / WOUND CARE / SHOWERING  Keep the surgical dressing until follow up.  The dressing is water proof, so you can shower without any extra covering.  IF THE DRESSING FALLS OFF or the wound gets wet inside, change the dressing with sterile gauze.  Please use good hand washing techniques before changing the dressing.  Do not use any lotions or creams on the incision until instructed by your surgeon.    ACTIVITY  o Increase activity slowly as tolerated, but follow the weight bearing instructions below.   o No driving for 6 weeks or until further direction given by your physician.  You cannot drive while taking narcotics.  o No lifting or carrying greater than 10 lbs. until further directed by your surgeon. o Avoid periods of inactivity such as sitting longer than an hour when not asleep. This helps prevent blood clots.  o You may return to work once you are authorized by your doctor.     WEIGHT BEARING   Weight bearing as tolerated with assist  device (walker, cane, etc) as directed, use it as long as suggested by your surgeon or therapist, typically at least 4-6 weeks.   EXERCISES  Results after joint replacement surgery are often greatly improved when you follow the exercise, range of motion and muscle strengthening exercises prescribed by your doctor. Safety measures are also important to protect the joint from further injury. Any time any of these exercises cause you to have increased pain or swelling, decrease what you are doing until you are comfortable again and then slowly increase them. If you have problems or questions, call your caregiver or physical therapist for advice.   Rehabilitation is important following a joint replacement. After just a few days of immobilization, the muscles of the leg can become weakened and shrink (atrophy).  These exercises are designed to build up the tone and strength of the thigh and leg muscles and to improve motion. Often times heat used for twenty to thirty minutes before working out will loosen up your tissues and help with improving the range of motion but do not use heat for the first two weeks following surgery (sometimes heat can increase post-operative swelling).   These exercises can be done on a training (exercise) mat, on the floor, on a table or on a bed. Use whatever works the best and is most comfortable for you.    Use music or television while you are exercising so that  the exercises are a pleasant break in your day. This will make your life better with the exercises acting as a break in your routine that you can look forward to.   Perform all exercises about fifteen times, three times per day or as directed.  You should exercise both the operative leg and the other leg as well.  Exercises include:    Quad Sets - Tighten up the muscle on the front of the thigh (Quad) and hold for 5-10 seconds.    Straight Leg Raises - With your knee straight (if you were given a brace, keep it on),  lift the leg to 60 degrees, hold for 3 seconds, and slowly lower the leg.  Perform this exercise against resistance later as your leg gets stronger.   Leg Slides: Lying on your back, slowly slide your foot toward your buttocks, bending your knee up off the floor (only go as far as is comfortable). Then slowly slide your foot back down until your leg is flat on the floor again.   Angel Wings: Lying on your back spread your legs to the side as far apart as you can without causing discomfort.   Hamstring Strength:  Lying on your back, push your heel against the floor with your leg straight by tightening up the muscles of your buttocks.  Repeat, but this time bend your knee to a comfortable angle, and push your heel against the floor.  You may put a pillow under the heel to make it more comfortable if necessary.   A rehabilitation program following joint replacement surgery can speed recovery and prevent re-injury in the future due to weakened muscles. Contact your doctor or a physical therapist for more information on knee rehabilitation.    CONSTIPATION  Constipation is defined medically as fewer than three stools per week and severe constipation as less than one stool per week.  Even if you have a regular bowel pattern at home, your normal regimen is likely to be disrupted due to multiple reasons following surgery.  Combination of anesthesia, postoperative narcotics, change in appetite and fluid intake all can affect your bowels.   YOU MUST use at least one of the following options; they are listed in order of increasing strength to get the job done.  They are all available over the counter, and you may need to use some, POSSIBLY even all of these options:    Drink plenty of fluids (prune juice may be helpful) and high fiber foods Colace 100 mg by mouth twice a day  Senokot for constipation as directed and as needed Dulcolax (bisacodyl), take with full glass of water  Miralax (polyethylene glycol)  once or twice a day as needed.  If you have tried all these things and are unable to have a bowel movement in the first 3-4 days after surgery call either your surgeon or your primary doctor.    If you experience loose stools or diarrhea, hold the medications until you stool forms back up.  If your symptoms do not get better within 1 week or if they get worse, check with your doctor.  If you experience "the worst abdominal pain ever" or develop nausea or vomiting, please contact the office immediately for further recommendations for treatment.   ITCHING:  If you experience itching with your medications, try taking only a single pain pill, or even half a pain pill at a time.  You can also use Benadryl over the counter for itching or also to  help with sleep.   TED HOSE STOCKINGS:  Use stockings on both legs until for at least 2 weeks or as directed by physician office. They may be removed at night for sleeping.  MEDICATIONS:  See your medication summary on the After Visit Summary that nursing will review with you.  You may have some home medications which will be placed on hold until you complete the course of blood thinner medication.  It is important for you to complete the blood thinner medication as prescribed.  PRECAUTIONS:  If you experience chest pain or shortness of breath - call 911 immediately for transfer to the hospital emergency department.   If you develop a fever greater that 101 F, purulent drainage from wound, increased redness or drainage from wound, foul odor from the wound/dressing, or calf pain - CONTACT YOUR SURGEON.                                                   FOLLOW-UP APPOINTMENTS:  If you do not already have a post-op appointment, please call the office for an appointment to be seen by your surgeon.  Guidelines for how soon to be seen are listed in your After Visit Summary, but are typically between 1-4 weeks after surgery.   MAKE SURE YOU:   Understand these  instructions.   Get help right away if you are not doing well or get worse.    Thank you for letting us be a part of your medical care team.  It is a privilege we respect greatly.  We hope these instructions will help you stay on track for a fast and full recovery!    ========================================================================  Information on my medicine - ELIQUIS (apixaban)  Why was Eliquis prescribed for you? Eliquis was prescribed for you to reduce the risk of a blood clot forming that can cause a stroke if you have a medical condition called atrial fibrillation (a type of irregular heartbeat).  What do You need to know about Eliquis ? Take your Eliquis TWICE DAILY - one tablet in the morning and one tablet in the evening with or without food. If you have difficulty swallowing the tablet whole please discuss with your pharmacist how to take the medication safely.  Take Eliquis exactly as prescribed by your doctor and DO NOT stop taking Eliquis without talking to the doctor who prescribed the medication.  Stopping may increase your risk of developing a stroke.  Refill your prescription before you run out.  After discharge, you should have regular check-up appointments with your healthcare provider that is prescribing your Eliquis.  In the future your dose may need to be changed if your kidney function or weight changes by a significant amount or as you get older.  What do you do if you miss a dose? If you miss a dose, take it as soon as you remember on the same day and resume taking twice daily.  Do not take more than one dose of ELIQUIS at the same time to make up a missed dose.  Important Safety Information A possible side effect of Eliquis is bleeding. You should call your healthcare provider right away if you experience any of the following: ? Bleeding from an injury or your nose that does not stop. ? Unusual colored urine (red or dark brown) or unusual colored  stools (red or black). ? Unusual bruising for unknown reasons. ? A serious fall or if you hit your head (even if there is no bleeding).  Some medicines may interact with Eliquis and might increase your risk of bleeding or clotting while on Eliquis. To help avoid this, consult your healthcare provider or pharmacist prior to using any new prescription or non-prescription medications, including herbals, vitamins, non-steroidal anti-inflammatory drugs (NSAIDs) and supplements.  This website has more information on Eliquis (apixaban): http://[email or web address].com/eliquis/home

## 2018-10-26 ENCOUNTER — Inpatient Hospital Stay (HOSPITAL_COMMUNITY): Payer: PPO

## 2018-10-26 DIAGNOSIS — I6350 Cerebral infarction due to unspecified occlusion or stenosis of unspecified cerebral artery: Secondary | ICD-10-CM

## 2018-10-26 LAB — CBC
HCT: 27.9 % — ABNORMAL LOW (ref 39.0–52.0)
Hemoglobin: 9.4 g/dL — ABNORMAL LOW (ref 13.0–17.0)
MCH: 30.1 pg (ref 26.0–34.0)
MCHC: 33.7 g/dL (ref 30.0–36.0)
MCV: 89.4 fL (ref 80.0–100.0)
Platelets: 220 10*3/uL (ref 150–400)
RBC: 3.12 MIL/uL — ABNORMAL LOW (ref 4.22–5.81)
RDW: 14 % (ref 11.5–15.5)
WBC: 11.9 10*3/uL — ABNORMAL HIGH (ref 4.0–10.5)
nRBC: 0 % (ref 0.0–0.2)

## 2018-10-26 LAB — BASIC METABOLIC PANEL
Anion gap: 9 (ref 5–15)
BUN: 24 mg/dL — ABNORMAL HIGH (ref 8–23)
CO2: 24 mmol/L (ref 22–32)
Calcium: 8.1 mg/dL — ABNORMAL LOW (ref 8.9–10.3)
Chloride: 104 mmol/L (ref 98–111)
Creatinine, Ser: 1.13 mg/dL (ref 0.61–1.24)
GFR calc Af Amer: >60 mL/min (ref >60)
GFR calc non Af Amer: 58 mL/min — ABNORMAL LOW (ref >60)
Glucose, Bld: 149 mg/dL — ABNORMAL HIGH (ref 70–99)
Potassium: 4.5 mmol/L (ref 3.5–5.1)
Sodium: 137 mmol/L (ref 135–145)

## 2018-10-26 MED ORDER — POLYETHYLENE GLYCOL 3350 17 G PO PACK
17.0000 g | PACK | Freq: Every day | ORAL | Status: DC
  Administered 2018-10-26 – 2018-10-27 (×2): 17 g via ORAL
  Filled 2018-10-26: qty 1

## 2018-10-26 MED ORDER — MAGNESIUM HYDROXIDE 400 MG/5ML PO SUSP
30.0000 mL | Freq: Once | ORAL | Status: AC
  Administered 2018-10-26: 30 mL via ORAL

## 2018-10-26 MED ORDER — SODIUM CHLORIDE 0.9 % IV BOLUS
250.0000 mL | Freq: Once | INTRAVENOUS | Status: AC
  Administered 2018-10-26: 250 mL via INTRAVENOUS

## 2018-10-26 NOTE — PMR Pre-admission (Signed)
PMR Admission Coordinator Pre-Admission Assessment  Patient: [name] is an 85 y.o., male MRN: [id number] DOB: [birth year] Height: _0  (180.3 cm) Weight: 74.8 kg  Insurance Information HMO:     PPO: yes     PCP:      IPA:      80/20:      OTHER:  PRIMARY: [employer]      Policy#: [id number]      Subscriber: Patient CM Name: Colletta Maryland     Phone#: [phone]     Fax#: [phone] Pre-Cert#: [id number]      Employer:  [employer]auf[employer] [phone]) provided by Colletta Maryland on 10/14 for admit to CIR. Pt is approved for 7 days. Follow up CM is Colletta Maryland (p): [phone] (f): [phone]; Epic access Benefits:  Phone #: [phone], option 1     Name: Crysta ref [id number] Eff. Date: 01/12/2018 - 01/12/2019     Deduct: no deductible ($0)      Out of Pocket Max: $3,400 ($607.76 met)      Life Max: NA CIR: $295/day co-pay for days 1-6, $0/day for days 7-90      SNF: $20/day co-pay for days 1-20, $16/day co-pay for days 21-100; limited to 100 days/benefit period Outpatient: limited by medical necessity      Co-Pay: $15/visit  Home Health: 100%; limited by medical necessity      Co-Pay: 0% co-insurance DME: 80% coverage     Co-Pay: 20% co-insurance Providers:  SECONDARY: BCBS      Policy#: [id number]      Subscriber: Patient CM Name:       Phone#:      Fax#:  Pre-Cert#:       Employer:  Benefits:  Phone #: [phone]     Name:  Eff. Date:      Deduct:       Out of Pocket Max:       Life Max:  CIR:       SNF:  Outpatient:      Co-Pay:  Home Health:       Co-Pay:  DME:      Co-Pay:  Medicaid Application Date:       Case Manager:  Disability Application Date:       Case Worker:   The "Data Collection Information Summary" for patients in Inpatient Rehabilitation Facilities with attached "Privacy Act [place] Records" was provided and verbally reviewed with: Patient  Emergency Contact Information Contact Information    Name Relation Home Work Mobile   Eddleman,Joanne Spouse  [phone]  [phone]   Dewayne Shorter Daughter   [phone]      Current Medical History  Patient Admitting Diagnosis: fall resulting in Left displaced femoral neck fracture s/p left hip hemiarthroplasty  History of Present Illness:  [name] is an 85 year old male with history of HTN, GERD/esophageal stricture, prostate cancer, PAF, prior stroke who was admitted on 10/24/18 after mechanical fall onto his left side with inability to stand or bear weight on LLE. He was found to have displaced left femoral neck fracture and underwent left hip hemiarthroplasty by Dr. Alvan Dame on 10/12. Post op to be WBAT with posterior hip precautions and Eliquis resumed on 10/13. He has had issue with acute renal failure treated with IVF for hydration as well as fevers 10/14 am--CXR showed LLE atelectasis with small pleural effusion. Pt reports he gets short of breath from time to time. Pt no longer had any reported fevers since 10/14 AM. Pt has been evaluated  by therapies with recommendation for CIR. Pt is to admit to CIR on 10/15.     Patient's medical record from [facility] has been reviewed by the rehabilitation admission coordinator and physician.  Past Medical History  Past Medical History:  Diagnosis Date  . Acute bronchitis 06/10/2015  . Allergic rhinitis   . Anemia   . Asthma   . Carotid stenosis    a. s/p Left CEA 2002;  b. Carotid US 3/16:  patent R CEA, L < 40%  . Chronic pain syndrome    Left shoulder, back  . Diverticulosis   . Dyslipidemia   . Epigastric pain 04/05/2016  . Esophageal stricture    Distal, benign  . GERD (gastroesophageal reflux disease)   . Hemorrhoids   . HTN (hypertension)    Negative renal duplex 07-22-11  . Hx of echocardiogram    a. Echo 10/11: mod LVH, EF 55-60%  . Pneumonia 07-2013  . Prostate cancer ([place]) 2000   Seed XRT  . Spondylosis, cervical, with myelopathy 11/26/2015  . 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  Prior Rehab/Hospitalizations Has the patient had prior rehab or hospitalizations prior to admission? No  Has the patient had major surgery during 100 days prior to admission? Yes   Current Medications  Current Facility-Administered Medications:  .  acetaminophen (TYLENOL) tablet 325-650 mg, 325-650 mg, Oral, Q6H PRN, Maurice March, PA-C, 650 mg at 10/26/18 0452 .  albuterol (PROVENTIL) (2.5 MG/3ML) 0.083% nebulizer solution 3 mL, 3 mL, Inhalation, Q6H PRN, Norins, Heinz Knuckles, MD .  alum & mag hydroxide-simeth (MAALOX/MYLANTA) 200-200-20 MG/5ML suspension 30 mL, 30 mL, Oral, Q4H PRN, Maurice March, PA-C .  amLODipine (NORVASC) tablet 5 mg, 5 mg, Oral, Daily, Norins, Heinz Knuckles, MD, 5 mg at 10/27/18 0842 .  apixaban (ELIQUIS) tablet 5 mg, 5 mg, Oral, BID, Maurice March, PA-C, 5 mg at 10/27/18 5638 .  atorvastatin (LIPITOR) tablet 40 mg, 40 mg, Oral, q1800, Maurice March, PA-C, 40 mg at 10/26/18 1725 .  bisacodyl (DULCOLAX) suppository 10 mg, 10 mg, Rectal, Daily PRN, Maurice March, PA-C .  budesonide (PULMICORT) nebulizer solution 0.25 mg, 0.25 mg, Inhalation, BID, Norins, Heinz Knuckles, MD, 0.25 mg at 10/27/18 0840 .  chlorhexidine (HIBICLENS) 4 % liquid 4 application, 60 mL, Topical, Once, Paralee Cancel, MD .  cloNIDine (CATAPRES) tablet 0.1 mg, 0.1 mg, Oral, Q8H, Norins, Heinz Knuckles, MD, 0.1 mg at 10/27/18 0536 .  diphenhydrAMINE (BENADRYL) capsule 25 mg, 25 mg, Oral, Q6H PRN, Maurice March, PA-C .  docusate sodium (COLACE) capsule 100 mg, 100 mg, Oral, BID, Norins, Heinz Knuckles, MD, 100 mg at 10/27/18 0842 .  ferrous sulfate tablet 325 mg, 325 mg, Oral, TID PC, Maurice March, PA-C, 325 mg  at 10/27/18 7564 .  HYDROcodone-acetaminophen (NORCO) 7.5-325 MG per tablet 1-2 tablet, 1-2 tablet, Oral, Q4H PRN, Maurice March, PA-C, 1 tablet at 10/26/18 1817 .  HYDROcodone-acetaminophen (NORCO/VICODIN) 5-325 MG per tablet 1-2 tablet, 1-2 tablet, Oral, Q4H PRN, Maurice March, PA-C .  HYDROmorphone (DILAUDID) injection 0.5-1 mg, 0.5-1 mg, Intravenous, Q4H PRN, Maurice March, PA-C .  ketorolac (TORADOL)  15 MG/ML injection 15 mg, 15 mg, Intravenous, Q6H PRN, Norins, Heinz Knuckles, MD, 15 mg at 10/27/18 0217 .  lactated ringers infusion, , Intravenous, Continuous, Nolon Nations, MD, Last Rate: 10 mL/hr at 10/24/18 1639 .  magnesium hydroxide (MILK OF MAGNESIA) suspension 15 mL, 15 mL, Oral, Daily PRN, Dessa Phi, DO, 15 mL at 10/26/18 2207 .  menthol-cetylpyridinium (CEPACOL) lozenge 3 mg, 1 lozenge, Oral, PRN **OR** phenol (CHLORASEPTIC) mouth spray 1 spray, 1 spray, Mouth/Throat, PRN, Stinson, Ashley R, PA-C .  methocarbamol (ROBAXIN) tablet 500 mg, 500 mg, Oral, Q6H PRN **OR** methocarbamol (ROBAXIN) 500 mg in dextrose 5 % 50 mL IVPB, 500 mg, Intravenous, Q6H PRN, Stinson, Ashley R, PA-C .  metoCLOPramide (REGLAN) tablet 5-10 mg, 5-10 mg, Oral, Q8H PRN **OR** metoCLOPramide (REGLAN) injection 5-10 mg, 5-10 mg, Intravenous, Q8H PRN, Stinson, Ashley R, PA-C .  metoCLOPramide (REGLAN) tablet 5 mg, 5 mg, Oral, QHS PRN, Norins, Heinz Knuckles, MD .  metoprolol succinate (TOPROL-XL) 24 hr tablet 12.5 mg, 12.5 mg, Oral, Daily, Norins, Heinz Knuckles, MD, 12.5 mg at 10/27/18 0843 .  minoxidil (LONITEN) tablet 10 mg, 10 mg, Oral, BID, Norins, Heinz Knuckles, MD, 10 mg at 10/27/18 0843 .  morphine 2 MG/ML injection 0.5 mg, 0.5 mg, Intravenous, Q2H PRN, Norins, Heinz Knuckles, MD, 0.5 mg at 10/24/18 0224 .  ondansetron (ZOFRAN) tablet 4 mg, 4 mg, Oral, Q6H PRN **OR** ondansetron (ZOFRAN) injection 4 mg, 4 mg, Intravenous, Q6H PRN, Stinson, Ashley R, PA-C .  pantoprazole (PROTONIX) EC tablet 40 mg, 40 mg, Oral, QAC  breakfast, Norins, Heinz Knuckles, MD, 40 mg at 10/27/18 0752 .  polyethylene glycol (MIRALAX / GLYCOLAX) packet 17 g, 17 g, Oral, Daily PRN, Maurice March, PA-C .  polyethylene glycol (MIRALAX / GLYCOLAX) packet 17 g, 17 g, Oral, Daily, Schorr, Rhetta Mura, NP, 17 g at 10/27/18 0843 .  polyvinyl alcohol (LIQUIFILM TEARS) 1.4 % ophthalmic solution 1 drop, 1 drop, Both Eyes, Daily PRN, Norins, Heinz Knuckles, MD .  povidone-iodine 10 % swab 2 application, 2 application, Topical, Once, Paralee Cancel, MD .  povidone-iodine 10 % swab 2 application, 2 application, Topical, Once, Paralee Cancel, MD  Patients Current Diet:  Diet Order            Diet regular Room service appropriate? Yes; Fluid consistency: Thin  Diet effective now              Precautions / Restrictions Precautions Precautions: Fall, Posterior Hip Precaution Booklet Issued: Yes (comment) Precaution Comments: Reviewed posterior hip precautions with pt throughout Restrictions Weight Bearing Restrictions: No LLE Weight Bearing: Weight bearing as tolerated   Has the patient had 2 or more falls or a fall with injury in the past year? Yes  Prior Activity Level Community (5-7x/wk): very active PTA; drove PTA, worked in yard, no AD use PTA  Prior Functional Level Self Care: Did the patient need help bathing, dressing, using the toilet or eating? Independent  Indoor Mobility: Did the patient need assistance with walking from room to room (with or without device)? Independent  Stairs: Did the patient need assistance with internal or external stairs (with or without device)? Independent  Functional Cognition: Did the patient need help planning regular tasks such as shopping or remembering to take medications? Independent  Home Assistive Devices / Equipment Home Assistive Devices/Equipment: None Home Equipment: Walker - 2 wheels, Shower seat, Bedside commode  Prior Device Use: Indicate devices/aids used by the patient prior to  current illness, exacerbation or injury?  None of the above  Current Functional Level Cognition  Overall Cognitive Status: Impaired/Different from baseline Orientation Level: Oriented X4 General Comments: Pt with decreased recall of precautions. Very motivated to participate in therapy despite pain    Extremity Assessment (includes Sensation/Coordination)  Upper Extremity Assessment: Defer to OT evaluation  Lower Extremity Assessment: LLE deficits/detail LLE Deficits / Details: Grossly decr AROM and strength, limited by pain postop; Tends to keep hip slightly internally rotated -- will need close guard LLE: Unable to fully assess due to pain    ADLs  Overall ADL's : Needs assistance/impaired Eating/Feeding: Set up, Sitting Grooming: Set up, Sitting Upper Body Bathing: Set up, Supervision/ safety, Sitting Lower Body Bathing: Moderate assistance, Sit to/from stand Upper Body Dressing : Set up, Supervision/safety, Sitting Lower Body Dressing: Maximal assistance, Sit to/from stand Toilet Transfer: Moderate assistance, +2 for safety/equipment, Ambulation, RW(simulated to recliner) Toilet Transfer Details (indicate cue type and reason): Mod A to power up into standing and then gain balance Functional mobility during ADLs: Moderate assistance, +2 for safety/equipment, Rolling walker, Cueing for sequencing General ADL Comments: Pt presenting with decreased balance, strength, and recall of precautions. Pt with high motivation.    Mobility  Overal bed mobility: Needs Assistance Bed Mobility: Supine to Sit Supine to sit: Min assist General bed mobility comments: cueing for maintaining hip precautions, assistance for trunk elevation    Transfers  Overall transfer level: Needs assistance Equipment used: Rolling walker (2 wheeled) Transfers: Sit to/from Stand Sit to Stand: Min assist, +2 safety/equipment, +2 physical assistance General transfer comment: cueing for safe hand placement,  assistance to rise and for stabilitly    Ambulation / Gait / Stairs / Emergency planning/management officer  Ambulation/Gait Ambulation/Gait assistance: Min assist, +2 safety/equipment(chair follow) Gait Distance (Feet): 75 Feet Assistive device: Rolling walker (2 wheeled) Gait Pattern/deviations: Step-to pattern, Decreased step length - right, Decreased step length - left, Decreased stride length, Decreased stance time - left, Decreased weight shift to left General Gait Details: pt with good sequencing with use of RW; pt maintaining proximity to RW and able to WB on L LE as tolerated Gait velocity: decreased    Posture / Balance Balance Overall balance assessment: Needs assistance Sitting-balance support: Feet supported Sitting balance-Leahy Scale: Fair Standing balance support: Bilateral upper extremity supported Standing balance-Leahy Scale: Poor    Special needs/care consideration BiPAP/CPAP : no CPM : no Continuous Drip IV : no Dialysis : no        Days : no Life Vest  : no Oxygen : yes 2L [place] Special Bed : no Trach Size : no Wound Vac (area) : no      Location : no Skin: surgical incision to Left hip          Bowel mgmt: continent, last BM 10/13 Bladder mgmt: continent Diabetic mgmt: : no Behavioral consideration : no Chemo/radiation: no   Previous Home Environment (from acute therapy documentation) Living Arrangements: Spouse/significant other(he is caregiver for her) Available Help at Discharge: Family, Available 24 hours/day, Other (Comment)(adult children will assist) Type of Home: House Home Layout: One level Home Access: Stairs to enter [employer] of Steps: 4 Bathroom Shower/Tub: Multimedia programmer: Handicapped height Home Care Services: No  Discharge Living Setting Plans for Discharge Living Setting: Patient's home, Lives with (comment)(wife) Type of Home at Discharge: House Discharge Home Layout: One level Discharge Home Access: Stairs to  enter Entrance Stairs-Rails: Can reach both Entrance Stairs-Number of Steps: 5 Discharge Bathroom Shower/Tub: Walk-in shower Discharge Bathroom  Toilet: Standard Discharge Bathroom Accessibility: Yes How Accessible: Accessible via walker Does the patient have any problems obtaining your medications?: No  Social/Family/Support Systems Patient Roles: Spouse Contact Information: wife: [name] [phone]; daughter Gunnar Bulla [phone]) Anticipated Caregiver: wife, daughter and son who do not work  Anticipated Ambulance person Information: see above Ability/Limitations of Caregiver: wife can do supervision; his daugther and son, anticipate Min A Caregiver Availability: 24/7 Discharge Plan Discussed with Primary Caregiver: Yes(wife and daugther Scarlet) Is Caregiver In Agreement with Plan?: Yes Does Caregiver/Family have Issues with Lodging/Transportation while Pt is in Rehab?: No  Goals/Additional Needs Patient/Family Goal for Rehab: PT/OT: Mod I; SLP: NA Expected length of stay: 7-10 days Cultural Considerations: NA Dietary Needs: regular diet, thin liquids Equipment Needs: TBD Pt/Family Agrees to Admission and willing to participate: Yes Program Orientation Provided & Reviewed with Pt/Caregiver Including Roles  & Responsibilities: Yes(pt, wife, and daughter Scarlet)  Barriers to Discharge: Home environment Child psychotherapist, Insurance for SNF coverage  Barriers to Discharge Comments: steps to enter home; wife can do supervision (but supportive family with children who live "one city block away."  Decrease burden of Care through IP rehab admission: NA  Possible need for SNF placement upon discharge: Not anticipated; pt has great family support and has a good prognosis for further recovery through CIR. Anticipate Mod I goals and pt to return home Independently after short CIR stay.   Patient Condition: I have reviewed medical records from [facility], spoken with MD,  and patient, spouse and daughter. I met with patient at the bedside for inpatient rehabilitation assessment.  Patient will benefit from ongoing PT and OT, can actively participate in 3 hours of therapy a day 5 days of the week, and can make measurable gains during the admission.  Patient will also benefit from the coordinated team approach during an Inpatient Acute Rehabilitation admission.  The patient will receive intensive therapy as well as Rehabilitation physician, nursing, social worker, and care management interventions.  Due to safety, skin/wound care, disease management, medication administration, pain management and patient education the patient requires 24 hour a day rehabilitation nursing.  The patient is currently Min A +2 for transfers and gait up to 75 feet and set up to Max A for basic ADLs.  Discharge setting and therapy post discharge at home with home health is anticipated.  Patient has agreed to participate in the Acute Inpatient Rehabilitation Program and will admit 10/27/2018.  Preadmission Screen Completed By:  Raechel Ache, 10/27/2018 12:00 PM ______________________________________________________________________   Discussed status with Dr. Naaman Plummer on 10/27/2018 at 12:00PM and received approval for admission today.  Admission Coordinator:  Raechel Ache, OT, time 12:00PM/Date 10/27/2018   Assessment/Plan: Diagnosis: left FNF s/p THA 1. Does the need for close, 24 hr/day Medical supervision in concert with the patient's rehab needs make it unreasonable for this patient to be served in a less intensive setting? Yes 2. Co-Morbidities requiring supervision/potential complications: PAF, fever, ABLA 3. Due to bladder management, bowel management, safety, skin/wound care, disease management, medication administration, pain management and patient education, does the patient require 24 hr/day rehab nursing? Yes 4. Does the patient require coordinated care of a physician, rehab nurse, PT,  OT, and SLP to address physical and functional deficits in the context of the above medical diagnosis(es)? Yes Addressing deficits in the following areas: balance, endurance, locomotion, strength, transferring, bowel/bladder control, bathing, dressing, feeding, grooming, toileting and psychosocial support 5. Can the patient actively participate in an intensive therapy program of at least  3 hrs of therapy 5 days a week? Yes 6. The potential for patient to make measurable gains while on inpatient rehab is excellent 7. Anticipated functional outcomes upon discharge from inpatient rehab: modified independent PT, modified independent OT, n/a SLP 8. Estimated rehab length of stay to reach the above functional goals is: 7-10 days 9. Anticipated discharge destination: Home 10. Overall Rehab/Functional Prognosis: excellent   MD Signature: Meredith Staggers, MD, [place] [facility] 10/27/2018

## 2018-10-26 NOTE — Progress Notes (Signed)
PROGRESS NOTE    [name]  QIO:[id number] DOB: 03/22/1929 DOA: 10/23/2018 PCP: Bradd Canary, MD   Brief Narrative:  HPI On 10/24/2018 by Dr. Illene Regulus [name] is a 85 y.o. male with medical history significant of hypertension GERD mild asthma and previous stroke.  Is very active.  He was at home and was getting up some food and tripped over his own feet falling down onto his left side.  He had immediate onset of pain in the left hip area and was unable to stand or bear weight.  Because of the pain and unable to bear weight he presented to the emergency department for evaluation.  Interim history Patient found to have displaced left femoral neck fracture.  Orthopedic surgery consulted and patient underwent left hip hemiarthroplasty on 10/24/2018.  Currently pending CIR. Assessment & Plan   Left hip fracture -Secondary to mechanical fall -CT head cervical spine without acute abnormality -Orthopedic surgery consulted and appreciated, status post left hip hemiarthroplasty with Dr. Charlann Boxer 10/24/2018 -Outpatient follow-up with Ortho in 2 weeks for wound check -Continue Eliquis  Fever -Overnight, patient had fever of one 1.2 -Have ordered UA and culture, blood cultures -Chest x-ray: Left lower lobe atelectasis and/or consolidation superimposed small left pleural effusion -Discussed with patient, have asked him to use his incentive spirometer -On examination, patient was on room air with no signs of respiratory distress -Patient tells me that he has shortness of breath from time to time  Acute kidney injury on chronic kidney disease, stage III -Baseline creatinine approximately 1-1.4 -Creatinine on admission 1.09 however was elevated to 1.42 postoperatively -Patient was placed on IV fluids -Creatinine down to 1.13 today -Continue BMP  Essential hypertension -Continue Norvasc, clonidine, metoprolol, minoxidil  History of CVA -Continue Eliquis, statin  Paroxysmal  atrial fibrillation -Continue Eliquis  Chronic diastolic heart failure -No evidence of decompensation -No longer taking Lasix as an outpatient  DVT Prophylaxis Eliquis  Code Status: Full code  Family Communication: None at bedside  Disposition Plan: Admitted.  Pending CIR  Consultants Orthopedic Inpatient rehab  Procedures  Left hip hemiarthroplasty  Antibiotics   Anti-infectives (From admission, onward)   Start     Dose/Rate Route Frequency Ordered Stop   10/25/18 0600  ceFAZolin (ANCEF) IVPB 2g/100 mL premix     2 g 200 mL/hr over 30 Minutes Intravenous To Short Stay 10/24/18 2024 10/26/18 0600   10/24/18 2300  ceFAZolin (ANCEF) IVPB 2g/100 mL premix     2 g 200 mL/hr over 30 Minutes Intravenous Every 6 hours 10/24/18 2000 10/25/18 0830   10/24/18 1638  ceFAZolin (ANCEF) 2-4 GM/100ML-% IVPB    Note to Pharmacy: Shireen Quan   : cabinet override      10/24/18 1638 10/25/18 0444      Subjective:   [name] seen and examined today.  Patient currently has no complaints.  States that he knows he had a fever overnight but is feeling fine.  States he sometimes gets cold.  Denies current chest pain or shortness of breath or cough, abdominal pain, nausea or vomiting, diarrhea or constipation, dizziness or headache, problems with urination.  Objective:   Vitals:   10/26/18 0413 10/26/18 0608 10/26/18 0800 10/26/18 0801  BP: (!) 135/54 (!) 105/35 (!) 141/47   Pulse: (!) 102 84 76   Resp: 17 18 15    Temp: (!) 101.2 F (38.4 C) 100 F (37.8 C) 98.6 F (37 C)   TempSrc: Oral Oral Oral  SpO2: 90% 90% 95% 95%  Weight:      Height:        Intake/Output Summary (Last 24 hours) at 10/26/2018 1305 Last data filed at 10/26/2018 0600 Gross per 24 hour  Intake 2662.07 ml  Output 650 ml  Net 2012.07 ml   Filed Weights   10/23/18 2153  Weight: 74.8 kg    Exam  General: Well developed, well nourished, elderly, NAD, appears stated age  HEENT: NCAT, mucous  membranes moist.   Neck: Supple  Cardiovascular: S1 S2 auscultated, regularly irregular  Respiratory: Clear to auscultation bilaterally, no wheezing  Abdomen: Soft, nontender, nondistended, + bowel sounds  Extremities: warm dry without cyanosis clubbing or edema  Neuro: AAOx3, nonfocal  Psych: Appropriate mood and affect   Data Reviewed: I have personally reviewed following labs and imaging studies  CBC: Recent Labs  Lab 10/23/18 2300 10/25/18 0156 10/26/18 0508  WBC 9.7 11.0* 11.9*  NEUTROABS 8.0*  --   --   HGB 12.2* 9.8* 9.4*  HCT 35.2* 29.1* 27.9*  MCV 89.1 89.5 89.4  PLT 245 189 220   Basic Metabolic Panel: Recent Labs  Lab 10/23/18 2300 10/25/18 0156 10/26/18 0508  NA 141 137 137  K 4.8 4.8 4.5  CL 106 103 104  CO2 26 24 24   GLUCOSE 118* 268* 149*  BUN 22 28* 24*  CREATININE 1.09 1.42* 1.13  CALCIUM 8.8* 8.3* 8.1*   GFR: Estimated Creatinine Clearance: 48.7 mL/min (by C-G formula based on SCr of 1.13 mg/dL). Liver Function Tests: No results for input(s): AST, ALT, ALKPHOS, BILITOT, PROT, ALBUMIN in the last 168 hours. No results for input(s): LIPASE, AMYLASE in the last 168 hours. No results for input(s): AMMONIA in the last 168 hours. Coagulation Profile: Recent Labs  Lab 10/23/18 2300  INR 1.3*   Cardiac Enzymes: No results for input(s): CKTOTAL, CKMB, CKMBINDEX, TROPONINI in the last 168 hours. BNP (last 3 results) Recent Labs    09/07/18 0948  PROBNP 1,352*   HbA1C: No results for input(s): HGBA1C in the last 72 hours. CBG: No results for input(s): GLUCAP in the last 168 hours. Lipid Profile: No results for input(s): CHOL, HDL, LDLCALC, TRIG, CHOLHDL, LDLDIRECT in the last 72 hours. Thyroid Function Tests: No results for input(s): TSH, T4TOTAL, FREET4, T3FREE, THYROIDAB in the last 72 hours. Anemia Panel: No results for input(s): VITAMINB12, FOLATE, FERRITIN, TIBC, IRON, RETICCTPCT in the last 72 hours. Urine analysis:     Component Value Date/Time   COLORURINE STRAW (A) 12/27/2016 0324   APPEARANCEUR CLEAR 12/27/2016 0324   LABSPEC 1.005 12/27/2016 0324   PHURINE 6.0 12/27/2016 0324   GLUCOSEU NEGATIVE 12/27/2016 0324   GLUCOSEU 100 (A) 12/01/2016 1858   HGBUR NEGATIVE 12/27/2016 0324   HGBUR 2+ 12/11/2008 1025   BILIRUBINUR 1+ 02/23/2018 1641   KETONESUR NEGATIVE 12/27/2016 0324   PROTEINUR Positive (A) 02/23/2018 1641   PROTEINUR NEGATIVE 12/27/2016 0324   UROBILINOGEN negative (A) 02/23/2018 1641   UROBILINOGEN 0.2 12/01/2016 1858   NITRITE Negative 02/23/2018 1641   NITRITE NEGATIVE 12/27/2016 0324   LEUKOCYTESUR Negative 02/23/2018 1641   Sepsis Labs: @LABRCNTIP (procalcitonin:4,lacticidven:4)  ) Recent Results (from the past 240 hour(s))  SARS CORONAVIRUS 2 (TAT 6-24 HRS) Nasopharyngeal Nasopharyngeal Swab     Status: None   Collection Time: 10/23/18 11:20 PM   Specimen: Nasopharyngeal Swab  Result Value Ref Range Status   SARS Coronavirus 2 NEGATIVE NEGATIVE Final    Comment: (NOTE) SARS-CoV-2 target nucleic acids are NOT DETECTED.  The SARS-CoV-2 RNA is generally detectable in upper and lower respiratory specimens during the acute phase of infection. Negative results do not preclude SARS-CoV-2 infection, do not rule out co-infections with other pathogens, and should not be used as the sole basis for treatment or other patient management decisions. Negative results must be combined with clinical observations, patient history, and epidemiological information. The expected result is Negative. Fact Sheet for Patients: HairSlick.no Fact Sheet for Healthcare Providers: quierodirigir.com This test is not yet approved or cleared by the [place] FDA and  has been authorized for detection and/or diagnosis of SARS-CoV-2 by FDA under an Emergency Use Authorization (EUA). This EUA will remain  in effect (meaning this test can be used)  for the duration of the COVID-19 declaration under Section 56 4(b)(1) of the Act, 21 U.S.C. section 360bbb-3(b)(1), unless the authorization is terminated or revoked sooner. Performed at [facility], [address], [place], [place] [address]   Surgical pcr screen     Status: None   Collection Time: 10/24/18  1:59 PM   Specimen: Nasal Mucosa; Nasal Swab  Result Value Ref Range Status   MRSA, PCR NEGATIVE NEGATIVE Final   Staphylococcus aureus NEGATIVE NEGATIVE Final    Comment: (NOTE) The Xpert SA Assay (FDA approved for NASAL specimens in patients 26 years of age and older), is one component of a comprehensive surveillance program. It is not intended to diagnose infection nor to guide or monitor treatment. Performed at [facility], [address], [place], [place] [address]       Radiology Studies: Pelvis Portable  Result Date: 10/24/2018 CLINICAL DATA:  Post left hip arthroplasty. EXAM: PORTABLE PELVIS 1-2 VIEWS COMPARISON:  None. FINDINGS: Patient is post left hip hemiarthroplasty with expected alignment of the native acetabulum and the articulating femoral component. There is a moderate to large joint effusion and intra-articular gas, both expected changes in the postoperative state. Additional overlying postsurgical skin changes are noted as well. Moderate degenerative features are noted in the right hip. Bones of the pelvis remain congruent. Prostate brachytherapy implants are present in the mid pelvis. Vascular calcium noted in the soft tissues. IMPRESSION: Status post left hip hemiarthroplasty without evidence of acute hardware complication. Moderate to large joint effusion and intra-articular gas, both expected changes in the immediate postoperative state. Electronically Signed   By: Kreg Shropshire M.D.   On: 10/24/2018 22:27   Dg Chest Port 1 View  Result Date: 10/26/2018 CLINICAL DATA:  85 year old male with history of fever. EXAM: PORTABLE CHEST 1 VIEW  COMPARISON:  Chest x-ray 10/23/2018. FINDINGS: Atelectasis and/or consolidation in the left lung base. Small left pleural effusion. Right lung is clear. No right pleural effusion. No pneumothorax. No definite suspicious appearing pulmonary nodule or mass. No evidence of pulmonary edema. Mild cardiomegaly. Upper mediastinal contours are within normal limits. Orthopedic fixation hardware in the lower cervical spine incidentally noted. Surgical clips in the lower right cervical region also noted. IMPRESSION: 1. Left lower lobe atelectasis and/or consolidation with superimposed small left pleural effusion. 2. Mild cardiomegaly. Electronically Signed   By: Trudie Reed M.D.   On: 10/26/2018 09:44     Scheduled Meds: . amLODipine  5 mg Oral Daily  . apixaban  5 mg Oral BID  . atorvastatin  40 mg Oral q1800  . budesonide  0.25 mg Inhalation BID  . chlorhexidine  60 mL Topical Once  . cloNIDine  0.1 mg Oral Q8H  . docusate sodium  100 mg  Oral BID  . ferrous sulfate  325 mg Oral TID PC  . metoprolol succinate  12.5 mg Oral Daily  . minoxidil  10 mg Oral BID  . pantoprazole  40 mg Oral QAC breakfast  . povidone-iodine  2 application Topical Once  . povidone-iodine  2 application Topical Once   Continuous Infusions: . lactated ringers 10 mL/hr at 10/24/18 1639  . methocarbamol (ROBAXIN) IV       LOS: 3 days   Time Spent in minutes   45 minutes  Shonnie Poudrier D.O. on 10/26/2018 at 1:05 PM  Between 7am to 7pm - Please see pager noted on [email or web address]  After 7pm go to [email or web address]  And look for the night coverage person covering for me after hours  [facility] Office  [phone]

## 2018-10-26 NOTE — Consult Note (Signed)
Inpatient Rehab Admissions:  Inpatient Rehab Consult received.  I met with pt at the bedside for rehabilitation assessment. Pt reports being Independent prior and was very active in and out of his house PTA. Pt has great family support at [place] and would like to be considered for CIR at this time. I will begin insurance authorization process for possible CIR admission.   Will follow up once insurance has made a determination.   [name], OTR/L  Rehab Admissions Coordinator  [phone] 10/26/2018 11:10 AM

## 2018-10-26 NOTE — Progress Notes (Signed)
NT asked the pt if he would want a bed bath;pt refused at this time saying "not at this time;i'm cold"

## 2018-10-26 NOTE — Progress Notes (Signed)
Physical Therapy Treatment Patient Details Name: [name] MRN: [id number] DOB: 10-19-1929 Today's Date: 10/26/2018    History of Present Illness [name] is a 85 y.o. male with medical history significant of hypertension GERD mild asthma and previous stroke.  Is very active.  He was at home and was getting up some food and tripped over his own feet falling down onto his left side. Now s/p L hip hemiarthroplasty, Posterior Approach, WBAT    PT Comments    Pt making good progress with functional mobility and tolerated ambulating a further distance this session. He continues to require cueing for adherence to posterior hip precautions. PT spoke to pt's wife via telephone at end of session to provide an update. Pt would continue to benefit from skilled physical therapy services at this time while admitted and after d/c to address the below listed limitations in order to improve overall safety and independence with functional mobility.  Pt on RA throughout with SPO2 maintaining >93%.    Follow Up Recommendations  CIR     Equipment Recommendations  Rolling walker with 5" wheels;3in1 (PT)    Recommendations for Other Services       Precautions / Restrictions Precautions Precautions: Fall;Posterior Hip Precaution Comments: Reviewed posterior hip precautions with pt throughout Restrictions Weight Bearing Restrictions: Yes LLE Weight Bearing: Weight bearing as tolerated    Mobility  Bed Mobility Overal bed mobility: Needs Assistance Bed Mobility: Supine to Sit     Supine to sit: Min assist     General bed mobility comments: cueing for maintaining hip precautions, assistance for trunk elevation  Transfers Overall transfer level: Needs assistance Equipment used: Rolling walker (2 wheeled) Transfers: Sit to/from Stand Sit to Stand: Min assist;+2 safety/equipment;+2 physical assistance         General transfer comment: cueing for safe hand placement, assistance to  rise and for stabilitly  Ambulation/Gait Ambulation/Gait assistance: Min assist;+2 safety/equipment(chair follow) Gait Distance (Feet): 75 Feet Assistive device: Rolling walker (2 wheeled) Gait Pattern/deviations: Step-to pattern;Decreased step length - right;Decreased step length - left;Decreased stride length;Decreased stance time - left;Decreased weight shift to left Gait velocity: decreased   General Gait Details: pt with good sequencing with use of RW; pt maintaining proximity to RW and able to WB on L LE as tolerated   Stairs             Wheelchair Mobility    Modified Rankin (Stroke Patients Only)       Balance Overall balance assessment: Needs assistance Sitting-balance support: Feet supported Sitting balance-Leahy Scale: Fair     Standing balance support: Bilateral upper extremity supported Standing balance-Leahy Scale: Poor                              Cognition Arousal/Alertness: Awake/alert Behavior During Therapy: WFL for tasks assessed/performed Overall Cognitive Status: Impaired/Different from baseline Area of Impairment: Memory                     Memory: Decreased short-term memory;Decreased recall of precautions                Exercises      General Comments        Pertinent Vitals/Pain Pain Assessment: 0-10 Pain Score: 7  Pain Location: L LE Pain Descriptors / Indicators: Discomfort;Grimacing;Guarding Pain Intervention(s): Monitored during session;Repositioned    Home Living  Prior Function            PT Goals (current goals can now be found in the care plan section) Acute Rehab PT Goals PT Goal Formulation: With patient Time For Goal Achievement: 11/08/18 Potential to Achieve Goals: Good Progress towards PT goals: Progressing toward goals    Frequency    Min 5X/week      PT Plan Current plan remains appropriate    Co-evaluation              AM-PAC PT  "6 Clicks" Mobility   Outcome Measure  Help needed turning from your back to your side while in a flat bed without using bedrails?: A Little Help needed moving from lying on your back to sitting on the side of a flat bed without using bedrails?: A Little Help needed moving to and from a bed to a chair (including a wheelchair)?: A Little Help needed standing up from a chair using your arms (e.g., wheelchair or bedside chair)?: A Little Help needed to walk in hospital room?: A Little Help needed climbing 3-5 steps with a railing? : A Lot 6 Click Score: 17    End of Session Equipment Utilized During Treatment: Gait belt Activity Tolerance: Patient tolerated treatment well Patient left: in chair;with call bell/phone within reach;with chair alarm set Nurse Communication: Mobility status PT Visit Diagnosis: Other abnormalities of gait and mobility (R26.89);Muscle weakness (generalized) (M62.81);Pain Pain - Right/Left: Left Pain - part of body: Hip     Time: 9794-8016 PT Time Calculation (min) (ACUTE ONLY): 28 min  Charges:  $Gait Training: 8-22 mins $Therapeutic Activity: 8-22 mins                     Sherie Don, PT, DPT  Acute Rehabilitation Services Pager [phone] Office [place] 10/26/2018, 4:00 PM

## 2018-10-26 NOTE — Progress Notes (Signed)
Called for a yellow MEWS. Pt with temp of 101.2, will give tylenol. Md paged. Continue vitals signs per guidelines below.    Vital Signs MEWS/VS Documentation      10/25/2018 2023 10/25/2018 2026 10/25/2018 2247 10/26/2018 0413   MEWS Score:  0  -  0  2   MEWS Score Color:  Green  -  Green  Yellow   Resp:  17  -  -  17   Pulse:  71  -  -  (!) 102   BP:  (!) 136/56  -  -  (!) 135/54   Temp:  99 F (37.2 C)  -  -  (!) 101.2 F (38.4 C)   O2 Device:  Room Air  Room Air  -  Room Air   Level of Consciousness:  -  -  Alert  -      MEWS Guidelines - (patients age 81 and over)  Red - At High Risk for Deterioration Yellow - At risk for Deterioration  1. Go to room and assess patient 2. Validate data. Is this patient's baseline? If data confirmed: 3. Is this an acute change? 4. Administer prn meds/treatments as ordered. 5. Note Sepsis score 6. Review goals of care 7. Sports coach, RRT nurse and Provider. 8. Ask Provider to come to bedside.  9. Document patient condition/interventions/response. 10. Increase frequency of vital signs and focused assessments to at least q15 minutes x 4, then q30 minutes x2. - If stable, then q1h x3, then q4h x3 and then q8h or dept. routine. - If unstable, contact Provider & RRT nurse. Prepare for possible transfer. 11. Add entry in progress notes using the smart phrase ".MEWS". 1. Go to room and assess patient 2. Validate data. Is this patient's baseline? If data confirmed: 3. Is this an acute change? 4. Administer prn meds/treatments as ordered? 5. Note Sepsis score 6. Review goals of care 7. Sports coach and Provider 8. Call RRT nurse as needed. 9. Document patient condition/interventions/response. 10. Increase frequency of vital signs and focused assessments to at least q2h x2. - If stable, then q4h x2 and then q8h or dept. routine. - If unstable, contact Provider & RRT nurse. Prepare for possible transfer. 11. Add entry in  progress notes using the smart phrase ".MEWS".  Green - Likely stable Lavender - Comfort Care Only  1. Continue routine/ordered monitoring.  2. Review goals of care. 1. Continue routine/ordered monitoring. 2. Review goals of care.         [name] 10/26/2018,4:28 AM

## 2018-10-26 NOTE — H&P (Signed)
Physical Medicine and Rehabilitation Admission H&P    Chief Complaint  Patient presents with   Functional deficits due to hip fracture    HPI:  [name] is an 85 year old male with history of HTN, GERD/esophageal stricture, prostate cancer, PAF, prior stroke who was admitted on 10/24/18 after mechanical fall onto his left side with inability to stand or bear weight on LLE. He was found to have displaced left femoral neck fracture and underwent left hip hemi by Dr. Alvan Dame on the same day. Post op to be WBAT with posterior hip precautions and Eliquis resumed on 10/13. He has had issue with acute renal failure treated with IVF for hydration as well as fever of 101.2 F 10/14 am and treated with fluid bolus.  CXR showed LLE atelectasis with small pleural effusion. UA  Negative/UCS pending.  Therapy ongoing and patient continues to have difficulty remembering hip precautions and pain affecting mobility and ADL tasks. CIR recommended due to functional decline.    Review of Systems  Constitutional: Negative for chills and fever.  HENT: Positive for hearing loss. Negative for tinnitus.   Eyes: Negative for blurred vision and double vision.  Respiratory: Positive for cough and wheezing.   Cardiovascular: Negative for chest pain and palpitations.  Gastrointestinal: Positive for constipation. Negative for heartburn and nausea.  Genitourinary: Positive for frequency and urgency.  Musculoskeletal: Negative for myalgias.  Skin: Negative for itching.  Neurological: Positive for weakness. Negative for dizziness and headaches.  Psychiatric/Behavioral: Negative for memory loss. The patient is not nervous/anxious.       Past Medical History:  Diagnosis Date   Acute bronchitis 06/10/2015   Allergic rhinitis    Anemia    Asthma    Carotid stenosis    a. s/p Left CEA 2002;  b. Carotid US 3/16:  patent R CEA, L < 40%   Chronic pain syndrome    Left shoulder, back   Diverticulosis     Dyslipidemia    Epigastric pain 04/05/2016   Esophageal stricture    Distal, benign   GERD (gastroesophageal reflux disease)    Hemorrhoids    HTN (hypertension)    Negative renal duplex 07-22-11   Hx of echocardiogram    a. Echo 10/11: mod LVH, EF 55-60%   Pneumonia 07-2013   Prostate cancer ([place]) 2000   Seed XRT   Spondylosis, cervical, with myelopathy 11/26/2015   Stroke ([place]) 8/01   right thalamic - on chronic Plavix/ASA    Past Surgical History:  Procedure Laterality Date   APPENDECTOMY  1953   CAROTID ENDARTERECTOMY Right 01/2000   CATARACT EXTRACTION Bilateral    CERVICAL [place] SURGERY  8/07   HIP ARTHROPLASTY Left 10/24/2018   Procedure: ARTHROPLASTY BIPOLAR HIP (HEMIARTHROPLASTY);  Surgeon: Paralee Cancel, MD;  Location: [facility];  Service: Orthopedics;  Laterality: Left;   INSERTION PROSTATE RADIATION SEED  2000   LUMBAR DISC SURGERY  1990s   NASAL SINUS SURGERY     x 3    Family History  Problem Relation Age of Onset   Stroke Brother    Stroke Sister    Stroke Mother    Hyperlipidemia Mother    Hypertension Mother    Lung disease Father    Diabetes Son    Gout Son    Obesity Sister    Alcohol abuse Brother    Cancer Brother        lung, smoker    Social History:  Married. Independent and  active without AD PTA.  He reports that he quit smoking about 40 years ago. His smoking use included cigarettes. He has a 8.75 pack-year smoking history. His smokeless tobacco use includes chew. He reports that he does not drink alcohol or use drugs.    Allergies  Allergen Reactions   Hctz [Hydrochlorothiazide] Shortness Of Breath, Swelling and Other (See Comments)    "Swelling and dyspnea"    Hydralazine Shortness Of Breath and Other (See Comments)    Chest pain and GI issues also   Diovan [Valsartan] Other (See Comments)    Elevated potassium- Hyperkalemia   Metformin And Related Other (See Comments)    Reaction not recalled   Nsaids  Other (See Comments)    Other than Tylenol, he isn't to have these because he's on Eliquis   Other Other (See Comments)    Unnamed topically-applied B/P patch = Caused redness   Prednisone Other (See Comments)    Suicidal thoughts   Spironolactone Swelling    Site of swelling not recalled   Codeine Itching and Nausea Only   Oxycodone-Acetaminophen Itching and Nausea Only    Medications Prior to Admission  Medication Sig Dispense Refill   acetaminophen (TYLENOL) 500 MG tablet Take 500 mg by mouth every 6 (six) hours as needed for mild pain or headache.     albuterol (PROVENTIL HFA;VENTOLIN HFA) 108 (90 Base) MCG/ACT inhaler Inhale 1-2 puffs into the lungs every 6 (six) hours as needed for wheezing or shortness of breath. 1 Inhaler 6   amLODipine (NORVASC) 5 MG tablet TAKE 1 TABLET BY MOUTH TWO TIMES DAILY (NEEDS OV) (Patient taking differently: Take 5 mg by mouth 2 (two) times daily. ) 180 tablet 3   apixaban (ELIQUIS) 5 MG TABS tablet Take 1 tablet (5 mg total) by mouth 2 (two) times daily. 180 tablet 3   cloNIDine (CATAPRES) 0.1 MG tablet TAKE 1 TABLET BY MOUTH THREE TO FOUR TIMES DAILY (Patient taking differently: Take 0.1 mg by mouth See admin instructions. Take 0.1 mg by mouth three times a day and may take an additional fourth dose once daily (0.1 mg) if B/P remains high) 120 tablet 10   docusate sodium (COLACE) 100 MG capsule Take 100 mg by mouth 2 (two) times daily.      gabapentin (NEURONTIN) 300 MG capsule TAKE 1 CAPSULE BY MOUTH 3 TIMES DAILY (Patient taking differently: Take 300 mg by mouth 3 (three) times daily. ) 270 capsule 1   magnesium hydroxide (MILK OF MAGNESIA) 400 MG/5ML suspension Take 15-30 mLs by mouth daily as needed for mild constipation.     metoCLOPramide (REGLAN) 5 MG tablet TAKE 1 TABLET BY MOUTH AT BEDTIME (Patient taking differently: Take 5 mg by mouth at bedtime. ) 30 tablet 5   minoxidil (LONITEN) 10 MG tablet Take 5-10 mg by mouth See admin  instructions. Take 10 mg by mouth in the morning, 5 mg midday, and 10 mg at bedtime     pantoprazole (PROTONIX) 40 MG tablet TAKE 1 TABLET (40 MG TOTAL) BY MOUTH DAILY BEFORE BREAKFAST. 90 tablet 0   polyethylene glycol (MIRALAX / GLYCOLAX) packet Take 17 g by mouth See admin instructions. Mix 17 grams of powder into 8 oz liquid, preferably warm prune juice, and drink at bedtime     polyvinyl alcohol (ARTIFICIAL TEARS) 1.4 % ophthalmic solution Place 1 drop into both eyes as needed for dry eyes.      traMADol (ULTRAM) 50 MG tablet TAKE 1 TABLET BY MOUTH EVERY  8 HOURS AS NEEDED (Patient taking differently: Take 50 mg by mouth 3 (three) times daily. ) 75 tablet 0   triamcinolone cream (KENALOG) 0.5 % Apply 1 application topically daily as needed (for skin irritation).      fluticasone (FLOVENT HFA) 110 MCG/ACT inhaler Inhale 2 puffs into the lungs 2 (two) times daily. (Patient not taking: Reported on 10/24/2018) 1 Inhaler 0   mupirocin ointment (BACTROBAN) 2 % Apply thin film twice a day (Patient not taking: Reported on 10/24/2018) 22 g 0    Drug Regimen Review  Drug regimen was reviewed and remains appropriate with no significant issues identified  Home: Home Living Family/patient expects to be discharged to:: Private residence Living Arrangements: Spouse/significant other(he is caregiver for her) Available Help at Discharge: Family, Available 24 hours/day, Other (Comment)(adult children will assist) Type of Home: House Home Access: Stairs to enter [employer] of Steps: 4 Home Layout: One level Bathroom Shower/Tub: Multimedia programmer: Handicapped height Home Equipment: Environmental consultant - 2 wheels, Shower seat, Bedside commode   Functional History: Prior Function Level of Independence: Independent Comments: ADLs, IADLs, and enjoys working in the yard  Functional Status:  Mobility: Bed Mobility Overal bed mobility: Needs Assistance Bed Mobility: Supine to  Sit Supine to sit: Mod assist General bed mobility comments: Cues for technqiue and heavy mod assist to elevate trunk to sit; close guard for prec due to tendency to internally rotate L hip Transfers Overall transfer level: Needs assistance Equipment used: Rolling walker (2 wheeled) Transfers: Sit to/from Stand Sit to Stand: Mod assist, +2 safety/equipment General transfer comment: Mod A for power up into standing and then gaining balance. cues for positioning of LLE. +2 for safety Ambulation/Gait Ambulation/Gait assistance: Min assist, +2 safety/equipment Gait Distance (Feet): 8 Feet Assistive device: Rolling walker (2 wheeled) Gait Pattern/deviations: Step-to pattern General Gait Details: Cues for gait sequence; good use of RW to unweigh painful L hip in stance    ADL: ADL Overall ADL's : Needs assistance/impaired Eating/Feeding: Set up, Sitting Grooming: Set up, Sitting Upper Body Bathing: Set up, Supervision/ safety, Sitting Lower Body Bathing: Moderate assistance, Sit to/from stand Upper Body Dressing : Set up, Supervision/safety, Sitting Lower Body Dressing: Maximal assistance, Sit to/from stand Toilet Transfer: Moderate assistance, +2 for safety/equipment, Ambulation, RW(simulated to recliner) Toilet Transfer Details (indicate cue type and reason): Mod A to power up into standing and then gain balance Functional mobility during ADLs: Moderate assistance, +2 for safety/equipment, Rolling walker, Cueing for sequencing General ADL Comments: Pt presenting with decreased balance, strength, and recall of precautions. Pt with high motivation.  Cognition: Cognition Overall Cognitive Status: Impaired/Different from baseline Orientation Level: Oriented X4 Cognition Arousal/Alertness: Awake/alert Behavior During Therapy: WFL for tasks assessed/performed Overall Cognitive Status: Impaired/Different from baseline Area of Impairment: Memory Memory: Decreased short-term memory,  Decreased recall of precautions General Comments: Pt with decreased recall of precautions. Very motivated to participate in therapy despite pain   Blood pressure (!) 141/47, pulse 76, temperature 98.6 F (37 C), temperature source Oral, resp. rate 15, height 5\' 11"  (1.803 m), weight 74.8 kg, SpO2 95 %. Physical Exam  Nursing note and vitals reviewed. Constitutional: He is oriented to person, place, and time. He appears well-developed and well-nourished. Nasal cannula in place.  Thin elderly male. Nasal congestion noted. Has increased WOB with conversation.   HENT:  Head: Normocephalic and atraumatic.  Eyes: Pupils are equal, round, and reactive to light. EOM are normal.  Neck: Normal range of motion. No tracheal deviation present.  No thyromegaly present.  Cardiovascular: Normal rate and regular rhythm. Exam reveals no friction rub.  No murmur heard. Respiratory: Effort normal. No stridor. No respiratory distress. He has wheezes.  Crackles left base  GI: Soft. He exhibits no distension. There is no abdominal tenderness.  Musculoskeletal:        General: Edema present.     Comments: Min edema left hip with dry surgical dressing.   Neurological: He is alert and oriented to person, place, and time.  Mild hearing loss. Able to answer basic orientation questions but needed calender to recall month. Able to follow simple motor commands without difficulty. UE 4/5. LLE 2-/5 HF, 2/5 KE, 4/5 ADF/PF. RLE 4/5 HF, KE and 5/5 ADF/PF. Sensory exam normal.     Results for orders placed or performed during the hospital encounter of 10/23/18 (from the past 48 hour(s))  Surgical pcr screen     Status: None   Collection Time: 10/24/18  1:59 PM   Specimen: Nasal Mucosa; Nasal Swab  Result Value Ref Range   MRSA, PCR NEGATIVE NEGATIVE   Staphylococcus aureus NEGATIVE NEGATIVE    Comment: (NOTE) The Xpert SA Assay (FDA approved for NASAL specimens in patients 29 years of age and older), is one component  of a comprehensive surveillance program. It is not intended to diagnose infection nor to guide or monitor treatment. Performed at [facility], [place] [address], [place], [place] [address]   CBC     Status: Abnormal   Collection Time: 10/25/18  1:56 AM  Result Value Ref Range   WBC 11.0 (H) 4.0 - 10.5 K/uL   RBC 3.25 (L) 4.22 - 5.81 MIL/uL   Hemoglobin 9.8 (L) 13.0 - 17.0 g/dL   HCT 29.1 (L) 39.0 - 52.0 %   MCV 89.5 80.0 - 100.0 fL   MCH 30.2 26.0 - 34.0 pg   MCHC 33.7 30.0 - 36.0 g/dL   RDW 13.9 11.5 - 15.5 %   Platelets 189 150 - 400 K/uL   nRBC 0.0 0.0 - 0.2 %    Comment: Performed at [facility], [place] [address] [place], [place] [address]  Basic metabolic panel     Status: Abnormal   Collection Time: 10/25/18  1:56 AM  Result Value Ref Range   Sodium 137 135 - 145 mmol/L   Potassium 4.8 3.5 - 5.1 mmol/L   Chloride 103 98 - 111 mmol/L   CO2 24 22 - 32 mmol/L   Glucose, Bld 268 (H) 70 - 99 mg/dL   BUN 28 (H) 8 - 23 mg/dL   Creatinine, Ser 1.42 (H) 0.61 - 1.24 mg/dL   Calcium 8.3 (L) 8.9 - 10.3 mg/dL   GFR calc non Af Amer 44 (L) >60 mL/min   GFR calc Af Amer 51 (L) >60 mL/min   Anion gap 10 5 - 15    Comment: Performed at [facility] [address], [place], [place] [address]  Basic metabolic panel     Status: Abnormal   Collection Time: 10/26/18  5:08 AM  Result Value Ref Range   Sodium 137 135 - 145 mmol/L   Potassium 4.5 3.5 - 5.1 mmol/L   Chloride 104 98 - 111 mmol/L   CO2 24 22 - 32 mmol/L   Glucose, Bld 149 (H) 70 - 99 mg/dL   BUN 24 (H) 8 - 23 mg/dL   Creatinine, Ser 1.13 0.61 - 1.24 mg/dL   Calcium 8.1 (L) 8.9 - 10.3  mg/dL   GFR calc non Af Amer 58 (L) >60 mL/min   GFR calc Af Amer >60 >60 mL/min   Anion gap 9 5 - 15    Comment: Performed at [facility] [address], [place], [place] [address]  CBC     Status: Abnormal   Collection Time: 10/26/18  5:08 AM  Result Value Ref Range   WBC 11.9 (H) 4.0 - 10.5 K/uL   RBC 3.12 (L)  4.22 - 5.81 MIL/uL   Hemoglobin 9.4 (L) 13.0 - 17.0 g/dL   HCT 27.9 (L) 39.0 - 52.0 %   MCV 89.4 80.0 - 100.0 fL   MCH 30.1 26.0 - 34.0 pg   MCHC 33.7 30.0 - 36.0 g/dL   RDW 14.0 11.5 - 15.5 %   Platelets 220 150 - 400 K/uL   nRBC 0.0 0.0 - 0.2 %    Comment: Performed at [facility], [place] [address], [place],  [address]   Pelvis Portable  Result Date: 10/24/2018 CLINICAL DATA:  Post left hip arthroplasty. EXAM: PORTABLE PELVIS 1-2 VIEWS COMPARISON:  None. FINDINGS: Patient is post left hip hemiarthroplasty with expected alignment of the native acetabulum and the articulating femoral component. There is a moderate to large joint effusion and intra-articular gas, both expected changes in the postoperative state. Additional overlying postsurgical skin changes are noted as well. Moderate degenerative features are noted in the right hip. Bones of the pelvis remain congruent. Prostate brachytherapy implants are present in the mid pelvis. Vascular calcium noted in the soft tissues. IMPRESSION: Status post left hip hemiarthroplasty without evidence of acute hardware complication. Moderate to large joint effusion and intra-articular gas, both expected changes in the immediate postoperative state. Electronically Signed   By: Lovena Le M.D.   On: 10/24/2018 22:27   Dg Chest Port 1 View  Result Date: 10/26/2018 CLINICAL DATA:  85 year old male with history of fever. EXAM: PORTABLE CHEST 1 VIEW COMPARISON:  Chest x-ray 10/23/2018. FINDINGS: Atelectasis and/or consolidation in the left lung base. Small left pleural effusion. Right lung is clear. No right pleural effusion. No pneumothorax. No definite suspicious appearing pulmonary nodule or mass. No evidence of pulmonary edema. Mild cardiomegaly. Upper mediastinal contours are within normal limits. Orthopedic fixation hardware in the lower cervical spine incidentally noted. Surgical clips in the lower right cervical region also noted.  IMPRESSION: 1. Left lower lobe atelectasis and/or consolidation with superimposed small left pleural effusion. 2. Mild cardiomegaly. Electronically Signed   By: Vinnie Langton M.D.   On: 10/26/2018 09:44       Medical Problem List and Plan: 1.  Functional and mobility deficits secondary to left FNF and subsequent left hip hemi  -.admit to inpatient rehab 2.  Antithrombotics: -DVT/anticoagulation:  Pharmaceutical: Other (comment)--Eliquis  -antiplatelet therapy: N/A 3. Pain Management: On toradol bid with prn hydrocodone.  4. Mood: LCSW to follow for evaluation and support.   -antipsychotic agents: N/A 5. Neuropsych: This patient is capable of making decisions on his own behalf. 6. Skin/Wound Care: routine pressure relief measures. Monitor wound daily. Protein supplements to help promote wound healing.  7. Fluids/Electrolytes/Nutrition: Strict I/O. Check lytes in am.  8. PAF: Monitor HR tid--on Eliquis and toprol for rate control.  9. Chronic diastolic CHF: Sounds to have overload. Will order CXR. Monitor weight daily. On Toprol, Minoxidil and Lipitor. No longer taking lasix apparently  -consider brief course of lasix 10. HTN: Has been labile but improving. Will monitor BP tid. Continue Norvasc,  Clonidine,Toprol and Minoxidil.  11. T2DM with neuropathy: Monitor BS ac/hs.  12. Acute renal failure: Improving after IVF for hydration. Currently on Kerotolac--need to monitor renal status closely 13. ABLA: Continue iron supplement. Monitor for any signs of bleeding. Recheck H/H in am. 14. Fevers with leucocytosis: Has defervesced--monitor for signs of infection. Await UCS and results of CXR. ?LLL pneumonia  -IS, OOB  15. Constipation: Increase miralax to bid.        Bary Leriche, PA-C 10/26/2018

## 2018-10-27 ENCOUNTER — Inpatient Hospital Stay (HOSPITAL_COMMUNITY): Payer: PPO

## 2018-10-27 ENCOUNTER — Inpatient Hospital Stay (HOSPITAL_COMMUNITY)
Admission: RE | Admit: 2018-10-27 | Discharge: 2018-11-05 | DRG: 560 | Disposition: A | Discharge status: Home Health | Payer: PPO | Source: Intra-hospital | Attending: Physical Medicine and Rehabilitation | Admitting: Physical Medicine and Rehabilitation

## 2018-10-27 ENCOUNTER — Other Ambulatory Visit: Payer: Self-pay

## 2018-10-27 ENCOUNTER — Encounter (HOSPITAL_COMMUNITY): Payer: Self-pay

## 2018-10-27 DIAGNOSIS — S72092S Other fracture of head and neck of left femur, sequela: Secondary | ICD-10-CM | POA: Diagnosis not present

## 2018-10-27 DIAGNOSIS — Z823 Family history of stroke: Secondary | ICD-10-CM | POA: Diagnosis not present

## 2018-10-27 DIAGNOSIS — S7292XS Unspecified fracture of left femur, sequela: Secondary | ICD-10-CM

## 2018-10-27 DIAGNOSIS — E785 Hyperlipidemia, unspecified: Secondary | ICD-10-CM | POA: Diagnosis not present

## 2018-10-27 DIAGNOSIS — W1830XD Fall on same level, unspecified, subsequent encounter: Secondary | ICD-10-CM

## 2018-10-27 DIAGNOSIS — Z7901 Long term (current) use of anticoagulants: Secondary | ICD-10-CM

## 2018-10-27 DIAGNOSIS — S7292XA Unspecified fracture of left femur, initial encounter for closed fracture: Secondary | ICD-10-CM

## 2018-10-27 DIAGNOSIS — N179 Acute kidney failure, unspecified: Secondary | ICD-10-CM

## 2018-10-27 DIAGNOSIS — I10 Essential (primary) hypertension: Secondary | ICD-10-CM | POA: Diagnosis present

## 2018-10-27 DIAGNOSIS — D62 Acute posthemorrhagic anemia: Secondary | ICD-10-CM | POA: Diagnosis not present

## 2018-10-27 DIAGNOSIS — I110 Hypertensive heart disease with heart failure: Secondary | ICD-10-CM | POA: Diagnosis present

## 2018-10-27 DIAGNOSIS — S72002D Fracture of unspecified part of neck of left femur, subsequent encounter for closed fracture with routine healing: Secondary | ICD-10-CM | POA: Diagnosis not present

## 2018-10-27 DIAGNOSIS — J45909 Unspecified asthma, uncomplicated: Secondary | ICD-10-CM | POA: Diagnosis not present

## 2018-10-27 DIAGNOSIS — Z8546 Personal history of malignant neoplasm of prostate: Secondary | ICD-10-CM

## 2018-10-27 DIAGNOSIS — I5032 Chronic diastolic (congestive) heart failure: Secondary | ICD-10-CM | POA: Diagnosis not present

## 2018-10-27 DIAGNOSIS — Z833 Family history of diabetes mellitus: Secondary | ICD-10-CM

## 2018-10-27 DIAGNOSIS — G894 Chronic pain syndrome: Secondary | ICD-10-CM | POA: Diagnosis not present

## 2018-10-27 DIAGNOSIS — E1140 Type 2 diabetes mellitus with diabetic neuropathy, unspecified: Secondary | ICD-10-CM | POA: Diagnosis present

## 2018-10-27 DIAGNOSIS — I480 Paroxysmal atrial fibrillation: Secondary | ICD-10-CM | POA: Diagnosis present

## 2018-10-27 DIAGNOSIS — Z8673 Personal history of transient ischemic attack (TIA), and cerebral infarction without residual deficits: Secondary | ICD-10-CM | POA: Diagnosis not present

## 2018-10-27 DIAGNOSIS — E875 Hyperkalemia: Secondary | ICD-10-CM | POA: Diagnosis not present

## 2018-10-27 DIAGNOSIS — E1142 Type 2 diabetes mellitus with diabetic polyneuropathy: Secondary | ICD-10-CM | POA: Diagnosis not present

## 2018-10-27 DIAGNOSIS — R32 Unspecified urinary incontinence: Secondary | ICD-10-CM | POA: Diagnosis not present

## 2018-10-27 DIAGNOSIS — G8918 Other acute postprocedural pain: Secondary | ICD-10-CM | POA: Diagnosis not present

## 2018-10-27 DIAGNOSIS — K5900 Constipation, unspecified: Secondary | ICD-10-CM | POA: Diagnosis not present

## 2018-10-27 DIAGNOSIS — N390 Urinary tract infection, site not specified: Secondary | ICD-10-CM | POA: Diagnosis not present

## 2018-10-27 DIAGNOSIS — R531 Weakness: Secondary | ICD-10-CM | POA: Diagnosis not present

## 2018-10-27 DIAGNOSIS — R062 Wheezing: Secondary | ICD-10-CM

## 2018-10-27 DIAGNOSIS — S72002S Fracture of unspecified part of neck of left femur, sequela: Secondary | ICD-10-CM | POA: Diagnosis not present

## 2018-10-27 DIAGNOSIS — E1165 Type 2 diabetes mellitus with hyperglycemia: Secondary | ICD-10-CM | POA: Diagnosis not present

## 2018-10-27 DIAGNOSIS — Z79899 Other long term (current) drug therapy: Secondary | ICD-10-CM

## 2018-10-27 DIAGNOSIS — S72092G Other fracture of head and neck of left femur, subsequent encounter for closed fracture with delayed healing: Secondary | ICD-10-CM | POA: Diagnosis not present

## 2018-10-27 DIAGNOSIS — F17220 Nicotine dependence, chewing tobacco, uncomplicated: Secondary | ICD-10-CM | POA: Diagnosis not present

## 2018-10-27 DIAGNOSIS — K5903 Drug induced constipation: Secondary | ICD-10-CM | POA: Diagnosis not present

## 2018-10-27 DIAGNOSIS — Z96642 Presence of left artificial hip joint: Secondary | ICD-10-CM | POA: Diagnosis not present

## 2018-10-27 DIAGNOSIS — R7309 Other abnormal glucose: Secondary | ICD-10-CM | POA: Diagnosis not present

## 2018-10-27 DIAGNOSIS — Z7952 Long term (current) use of systemic steroids: Secondary | ICD-10-CM | POA: Diagnosis not present

## 2018-10-27 DIAGNOSIS — R2689 Other abnormalities of gait and mobility: Secondary | ICD-10-CM | POA: Diagnosis not present

## 2018-10-27 DIAGNOSIS — N3943 Post-void dribbling: Secondary | ICD-10-CM | POA: Diagnosis not present

## 2018-10-27 HISTORY — DX: Unspecified fracture of left femur, initial encounter for closed fracture: S72.92XA

## 2018-10-27 LAB — CBC
HCT: 26 % — ABNORMAL LOW (ref 39.0–52.0)
Hemoglobin: 8.8 g/dL — ABNORMAL LOW (ref 13.0–17.0)
MCH: 30.1 pg (ref 26.0–34.0)
MCHC: 33.8 g/dL (ref 30.0–36.0)
MCV: 89 fL (ref 80.0–100.0)
Platelets: 228 10*3/uL (ref 150–400)
RBC: 2.92 MIL/uL — ABNORMAL LOW (ref 4.22–5.81)
RDW: 13.9 % (ref 11.5–15.5)
WBC: 10.1 10*3/uL (ref 4.0–10.5)
nRBC: 0 % (ref 0.0–0.2)

## 2018-10-27 LAB — URINALYSIS, ROUTINE W REFLEX MICROSCOPIC
Bacteria, UA: NONE SEEN
Bilirubin Urine: NEGATIVE
Glucose, UA: NEGATIVE mg/dL
Ketones, ur: NEGATIVE mg/dL
Leukocytes,Ua: NEGATIVE
Nitrite: NEGATIVE
Protein, ur: 100 mg/dL — AB
Specific Gravity, Urine: 1.018 (ref 1.005–1.030)
pH: 5 (ref 5.0–8.0)

## 2018-10-27 MED ORDER — CLONIDINE HCL 0.1 MG PO TABS
0.1000 mg | ORAL_TABLET | Freq: Three times a day (TID) | ORAL | Status: DC
  Administered 2018-10-27 – 2018-11-01 (×14): 0.1 mg via ORAL
  Filled 2018-10-27 (×14): qty 1

## 2018-10-27 MED ORDER — ALBUTEROL SULFATE (2.5 MG/3ML) 0.083% IN NEBU
3.0000 mL | INHALATION_SOLUTION | Freq: Four times a day (QID) | RESPIRATORY_TRACT | Status: DC | PRN
  Administered 2018-10-29: 3 mL via RESPIRATORY_TRACT
  Filled 2018-10-27: qty 3

## 2018-10-27 MED ORDER — BUDESONIDE 0.25 MG/2ML IN SUSP
0.2500 mg | Freq: Two times a day (BID) | RESPIRATORY_TRACT | Status: DC
  Administered 2018-10-27 – 2018-11-05 (×18): 0.25 mg via RESPIRATORY_TRACT
  Filled 2018-10-27 (×20): qty 2

## 2018-10-27 MED ORDER — ATORVASTATIN CALCIUM 40 MG PO TABS
40.0000 mg | ORAL_TABLET | Freq: Every day | ORAL | Status: DC
  Administered 2018-10-27 – 2018-11-04 (×9): 40 mg via ORAL
  Filled 2018-10-27 (×9): qty 1

## 2018-10-27 MED ORDER — PROCHLORPERAZINE EDISYLATE 10 MG/2ML IJ SOLN: 5.0000 mg | Freq: Four times a day (QID) | INTRAMUSCULAR | Status: DC | PRN

## 2018-10-27 MED ORDER — POLYETHYLENE GLYCOL 3350 17 G PO PACK
17.0000 g | PACK | Freq: Two times a day (BID) | ORAL | Status: DC
  Administered 2018-10-27 – 2018-11-05 (×13): 17 g via ORAL
  Filled 2018-10-27 (×18): qty 1

## 2018-10-27 MED ORDER — PROCHLORPERAZINE MALEATE 5 MG PO TABS: 5.0000 mg | ORAL_TABLET | Freq: Four times a day (QID) | ORAL | Status: DC | PRN

## 2018-10-27 MED ORDER — PHENOL 1.4 % MT LIQD: 1.0000 | OROMUCOSAL | Status: DC | PRN

## 2018-10-27 MED ORDER — KETOROLAC TROMETHAMINE 15 MG/ML IJ SOLN
15.0000 mg | Freq: Four times a day (QID) | INTRAMUSCULAR | Status: AC | PRN
  Filled 2018-10-27: qty 1

## 2018-10-27 MED ORDER — GUAIFENESIN-DM 100-10 MG/5ML PO SYRP: 5.0000 mL | ORAL_SOLUTION | Freq: Four times a day (QID) | ORAL | Status: DC | PRN

## 2018-10-27 MED ORDER — FLEET ENEMA 7-19 GM/118ML RE ENEM: 1.0000 | ENEMA | Freq: Once | RECTAL | Status: DC | PRN

## 2018-10-27 MED ORDER — PANTOPRAZOLE SODIUM 40 MG PO TBEC
40.0000 mg | DELAYED_RELEASE_TABLET | Freq: Every day | ORAL | Status: DC
  Administered 2018-10-28 – 2018-11-05 (×9): 40 mg via ORAL
  Filled 2018-10-27 (×9): qty 1

## 2018-10-27 MED ORDER — BISACODYL 10 MG RE SUPP: 10.0000 mg | Freq: Every day | RECTAL | Status: DC | PRN

## 2018-10-27 MED ORDER — MENTHOL 3 MG MT LOZG: 1.0000 | LOZENGE | OROMUCOSAL | Status: DC | PRN

## 2018-10-27 MED ORDER — TRAZODONE HCL 50 MG PO TABS
25.0000 mg | ORAL_TABLET | Freq: Every evening | ORAL | Status: DC | PRN
  Administered 2018-10-28 – 2018-10-31 (×3): 50 mg via ORAL
  Filled 2018-10-27 (×4): qty 1

## 2018-10-27 MED ORDER — POLYETHYLENE GLYCOL 3350 17 G PO PACK: 17.0000 g | PACK | Freq: Every day | ORAL | Status: DC | PRN

## 2018-10-27 MED ORDER — TRAMADOL HCL 50 MG PO TABS
50.0000 mg | ORAL_TABLET | Freq: Four times a day (QID) | ORAL | Status: DC | PRN
  Administered 2018-10-28 – 2018-11-05 (×23): 50 mg via ORAL
  Filled 2018-10-27 (×26): qty 1

## 2018-10-27 MED ORDER — POLYVINYL ALCOHOL 1.4 % OP SOLN
1.0000 [drp] | Freq: Every day | OPHTHALMIC | Status: DC | PRN
  Administered 2018-10-30 – 2018-10-31 (×2): 1 [drp] via OPHTHALMIC
  Filled 2018-10-27 (×3): qty 15

## 2018-10-27 MED ORDER — METOPROLOL SUCCINATE ER 25 MG PO TB24: 12.5000 mg | ORAL_TABLET | Freq: Every day | ORAL | Status: DC

## 2018-10-27 MED ORDER — MINOXIDIL 10 MG PO TABS
10.0000 mg | ORAL_TABLET | Freq: Two times a day (BID) | ORAL | Status: DC
  Administered 2018-10-27 – 2018-11-05 (×17): 10 mg via ORAL
  Filled 2018-10-27 (×19): qty 1

## 2018-10-27 MED ORDER — ACETAMINOPHEN 325 MG PO TABS
325.0000 mg | ORAL_TABLET | ORAL | Status: DC | PRN
  Administered 2018-10-27 – 2018-11-05 (×18): 650 mg via ORAL
  Filled 2018-10-27 (×18): qty 2

## 2018-10-27 MED ORDER — FERROUS SULFATE 325 (65 FE) MG PO TABS
325.0000 mg | ORAL_TABLET | Freq: Three times a day (TID) | ORAL | Status: DC
  Administered 2018-10-27 – 2018-11-05 (×26): 325 mg via ORAL
  Filled 2018-10-27 (×26): qty 1

## 2018-10-27 MED ORDER — APIXABAN 5 MG PO TABS
5.0000 mg | ORAL_TABLET | Freq: Two times a day (BID) | ORAL | Status: DC
  Administered 2018-10-27 – 2018-11-05 (×18): 5 mg via ORAL
  Filled 2018-10-27 (×18): qty 1

## 2018-10-27 MED ORDER — AMLODIPINE BESYLATE 5 MG PO TABS
5.0000 mg | ORAL_TABLET | Freq: Every day | ORAL | Status: DC
  Administered 2018-10-28 – 2018-10-29 (×2): 5 mg via ORAL
  Filled 2018-10-27 (×2): qty 1

## 2018-10-27 MED ORDER — PROCHLORPERAZINE 25 MG RE SUPP: 12.5000 mg | Freq: Four times a day (QID) | RECTAL | Status: DC | PRN

## 2018-10-27 MED ORDER — DOCUSATE SODIUM 100 MG PO CAPS
100.0000 mg | ORAL_CAPSULE | Freq: Two times a day (BID) | ORAL | Status: DC
  Administered 2018-10-27 – 2018-11-05 (×17): 100 mg via ORAL
  Filled 2018-10-27 (×19): qty 1

## 2018-10-27 MED ORDER — ALUM & MAG HYDROXIDE-SIMETH 200-200-20 MG/5ML PO SUSP: 30.0000 mL | ORAL | Status: DC | PRN

## 2018-10-27 MED ORDER — METOPROLOL SUCCINATE ER 25 MG PO TB24
12.5000 mg | ORAL_TABLET | Freq: Every day | ORAL | Status: DC
  Administered 2018-10-28 – 2018-11-05 (×9): 12.5 mg via ORAL
  Filled 2018-10-27 (×9): qty 1

## 2018-10-27 MED ORDER — DIPHENHYDRAMINE HCL 12.5 MG/5ML PO ELIX: 12.5000 mg | ORAL_SOLUTION | Freq: Four times a day (QID) | ORAL | Status: DC | PRN

## 2018-10-27 NOTE — Progress Notes (Signed)
O2 saturation was 95% [place] while getting patient into chair. Patient was very short of breath after ambulating from bed to chair, couldn't go without O2 at this time. Will continue to monitor.    Farley Ly RN

## 2018-10-27 NOTE — Progress Notes (Signed)
Meredith Staggers, MD  Physician  Physical Medicine and Rehabilitation  PMR Pre-admission  Signed  Date of Service:  10/26/2018  4:36 PM      Related encounter: ED to Hosp-Admission (Discharged) from 10/23/2018 in [place]        PMR Admission Coordinator Pre-Admission Assessment   Patient: [name] is an 85 y.o., male MRN: [id number] DOB: September 16, 1929 Height: 5' 11"  (180.3 cm) Weight: 74.8 kg   Insurance Information HMO:     PPO: yes     PCP:      IPA:      80/20:      OTHER:  PRIMARY: [employer]      Policy#: [id number]      Subscriber: Patient CM Name: Colletta Maryland     Phone#: [phone]     Fax#: [phone] Pre-Cert#: [id number]      Employer:  [employer]auf[employer] [phone]) provided by Colletta Maryland on 10/14 for admit to CIR. Pt is approved for 7 days. Follow up CM is Colletta Maryland (p): [phone] (f): [phone]; Epic access Benefits:  Phone #: [phone], option 1     Name: Crysta ref [phone] Eff. Date: 01/12/2018 - 01/12/2019     Deduct: no deductible ($0)      Out of Pocket Max: $3,400 ($607.76 met)      Life Max: NA CIR: $295/day co-pay for days 1-6, $0/day for days 7-90      SNF: $20/day co-pay for days 1-20, $16/day co-pay for days 21-100; limited to 100 days/benefit period Outpatient: limited by medical necessity      Co-Pay: $15/visit  Home Health: 100%; limited by medical necessity      Co-Pay: 0% co-insurance DME: 80% coverage     Co-Pay: 20% co-insurance Providers:  SECONDARY: BCBS      Policy#: [id number]      Subscriber: Patient CM Name:       Phone#:      Fax#:  Pre-Cert#:       Employer:  Benefits:  Phone #: [phone]     Name:  Eff. Date:      Deduct:       Out of Pocket Max:       Life Max:  CIR:       SNF:  Outpatient:      Co-Pay:  Home Health:       Co-Pay:  DME:      Co-Pay:   Medicaid Application Date:       Case Manager:  Disability Application Date:       Case Worker:    The  "Data Collection Information Summary" for patients in Inpatient Rehabilitation Facilities with attached "Privacy Act [place] Records" was provided and verbally reviewed with: Patient   Emergency Contact Information         Contact Information     Name Relation Home Work Mobile    Ariel,Joanne Spouse [phone]   [phone]    Dewayne Shorter Daughter     [phone]         Current Medical History  Patient Admitting Diagnosis: fall resulting in Left displaced femoral neck fracture s/p left hip hemiarthroplasty   History of Present Illness:  [name] is an 85 year old male with history of HTN, GERD/esophageal stricture, prostate cancer, PAF, prior stroke who was admitted on 10/24/18 after mechanical fall onto his left side with inability to stand or bear weight on LLE. He was found  to have displaced left femoral neck fracture and underwent left hip hemiarthroplasty by Dr. Alvan Dame on 10/12. Post op to be WBAT with posterior hip precautions and Eliquis resumed on 10/13. He has had issue with acute renal failure treated with IVF for hydration as well as fevers 10/14 am--CXR showed LLE atelectasis with small pleural effusion. Pt reports he gets short of breath from time to time. Pt no longer had any reported fevers since 10/14 AM. Pt has been evaluated by therapies with recommendation for CIR. Pt is to admit to CIR on 10/15.    Patient's medical record from [facility] has been reviewed by the rehabilitation admission coordinator and physician.   Past Medical History      Past Medical History:  Diagnosis Date  . Acute bronchitis 06/10/2015  . Allergic rhinitis    . Anemia    . Asthma    . Carotid stenosis      a. s/p Left CEA 2002;  b. Carotid US 3/16:  patent R CEA, L < 40%  . Chronic pain syndrome      Left shoulder, back  . Diverticulosis    . Dyslipidemia    . Epigastric pain 04/05/2016  . Esophageal stricture      Distal, benign  . GERD  (gastroesophageal reflux disease)    . Hemorrhoids    . HTN (hypertension)      Negative renal duplex 07-22-11  . Hx of echocardiogram      a. Echo 10/11: mod LVH, EF 55-60%  . Pneumonia 07-2013  . Prostate cancer ([place]) 2000    Seed XRT  . Spondylosis, cervical, with myelopathy 11/26/2015  . Stroke [facility]) 8/01    right thalamic - on chronic Plavix/ASA      Family History   family history includes Alcohol abuse in his brother; Cancer in his brother; Diabetes in his son; Gout in his son; Hyperlipidemia in his mother; Hypertension in his mother; Lung disease in his father; Obesity in his sister; Stroke in his brother, mother, and sister.   Prior Rehab/Hospitalizations Has the patient had prior rehab or hospitalizations prior to admission? No   Has the patient had major surgery during 100 days prior to admission? Yes              Current Medications   Current Facility-Administered Medications:  .  acetaminophen (TYLENOL) tablet 325-650 mg, 325-650 mg, Oral, Q6H PRN, Maurice March, PA-C, 650 mg at 10/26/18 0452 .  albuterol (PROVENTIL) (2.5 MG/3ML) 0.083% nebulizer solution 3 mL, 3 mL, Inhalation, Q6H PRN, Norins, Heinz Knuckles, MD .  alum & mag hydroxide-simeth (MAALOX/MYLANTA) 200-200-20 MG/5ML suspension 30 mL, 30 mL, Oral, Q4H PRN, Maurice March, PA-C .  amLODipine (NORVASC) tablet 5 mg, 5 mg, Oral, Daily, Norins, Heinz Knuckles, MD, 5 mg at 10/27/18 0842 .  apixaban (ELIQUIS) tablet 5 mg, 5 mg, Oral, BID, Maurice March, PA-C, 5 mg at 10/27/18 7517 .  atorvastatin (LIPITOR) tablet 40 mg, 40 mg, Oral, q1800, Maurice March, PA-C, 40 mg at 10/26/18 1725 .  bisacodyl (DULCOLAX) suppository 10 mg, 10 mg, Rectal, Daily PRN, Maurice March, PA-C .  budesonide (PULMICORT) nebulizer solution 0.25 mg, 0.25 mg, Inhalation, BID, Norins, Heinz Knuckles, MD, 0.25 mg at 10/27/18 0840 .  chlorhexidine (HIBICLENS) 4 % liquid 4 application, 60 mL, Topical, Once, Paralee Cancel, MD .  cloNIDine  (CATAPRES) tablet 0.1 mg, 0.1 mg, Oral, Q8H, Norins, Heinz Knuckles, MD, 0.1 mg at 10/27/18 0536 .  diphenhydrAMINE (BENADRYL) capsule 25 mg, 25 mg, Oral, Q6H PRN, Maurice March, PA-C .  docusate sodium (COLACE) capsule 100 mg, 100 mg, Oral, BID, Norins, Heinz Knuckles, MD, 100 mg at 10/27/18 0842 .  ferrous sulfate tablet 325 mg, 325 mg, Oral, TID PC, Maurice March, PA-C, 325 mg at 10/27/18 1829 .  HYDROcodone-acetaminophen (NORCO) 7.5-325 MG per tablet 1-2 tablet, 1-2 tablet, Oral, Q4H PRN, Maurice March, PA-C, 1 tablet at 10/26/18 1817 .  HYDROcodone-acetaminophen (NORCO/VICODIN) 5-325 MG per tablet 1-2 tablet, 1-2 tablet, Oral, Q4H PRN, Maurice March, PA-C .  HYDROmorphone (DILAUDID) injection 0.5-1 mg, 0.5-1 mg, Intravenous, Q4H PRN, Maurice March, PA-C .  ketorolac (TORADOL) 15 MG/ML injection 15 mg, 15 mg, Intravenous, Q6H PRN, Norins, Heinz Knuckles, MD, 15 mg at 10/27/18 0217 .  lactated ringers infusion, , Intravenous, Continuous, Nolon Nations, MD, Last Rate: 10 mL/hr at 10/24/18 1639 .  magnesium hydroxide (MILK OF MAGNESIA) suspension 15 mL, 15 mL, Oral, Daily PRN, Dessa Phi, DO, 15 mL at 10/26/18 2207 .  menthol-cetylpyridinium (CEPACOL) lozenge 3 mg, 1 lozenge, Oral, PRN **OR** phenol (CHLORASEPTIC) mouth spray 1 spray, 1 spray, Mouth/Throat, PRN, Stinson, Ashley R, PA-C .  methocarbamol (ROBAXIN) tablet 500 mg, 500 mg, Oral, Q6H PRN **OR** methocarbamol (ROBAXIN) 500 mg in dextrose 5 % 50 mL IVPB, 500 mg, Intravenous, Q6H PRN, Stinson, Ashley R, PA-C .  metoCLOPramide (REGLAN) tablet 5-10 mg, 5-10 mg, Oral, Q8H PRN **OR** metoCLOPramide (REGLAN) injection 5-10 mg, 5-10 mg, Intravenous, Q8H PRN, Stinson, Ashley R, PA-C .  metoCLOPramide (REGLAN) tablet 5 mg, 5 mg, Oral, QHS PRN, Norins, Heinz Knuckles, MD .  metoprolol succinate (TOPROL-XL) 24 hr tablet 12.5 mg, 12.5 mg, Oral, Daily, Norins, Heinz Knuckles, MD, 12.5 mg at 10/27/18 0843 .  minoxidil (LONITEN) tablet 10 mg, 10 mg, Oral,  BID, Norins, Heinz Knuckles, MD, 10 mg at 10/27/18 0843 .  morphine 2 MG/ML injection 0.5 mg, 0.5 mg, Intravenous, Q2H PRN, Norins, Heinz Knuckles, MD, 0.5 mg at 10/24/18 0224 .  ondansetron (ZOFRAN) tablet 4 mg, 4 mg, Oral, Q6H PRN **OR** ondansetron (ZOFRAN) injection 4 mg, 4 mg, Intravenous, Q6H PRN, Stinson, Ashley R, PA-C .  pantoprazole (PROTONIX) EC tablet 40 mg, 40 mg, Oral, QAC breakfast, Norins, Heinz Knuckles, MD, 40 mg at 10/27/18 0752 .  polyethylene glycol (MIRALAX / GLYCOLAX) packet 17 g, 17 g, Oral, Daily PRN, Maurice March, PA-C .  polyethylene glycol (MIRALAX / GLYCOLAX) packet 17 g, 17 g, Oral, Daily, Schorr, Rhetta Mura, NP, 17 g at 10/27/18 0843 .  polyvinyl alcohol (LIQUIFILM TEARS) 1.4 % ophthalmic solution 1 drop, 1 drop, Both Eyes, Daily PRN, Norins, Heinz Knuckles, MD .  povidone-iodine 10 % swab 2 application, 2 application, Topical, Once, Paralee Cancel, MD .  povidone-iodine 10 % swab 2 application, 2 application, Topical, Once, Paralee Cancel, MD   Patients Current Diet:     Diet Order                      Diet regular Room service appropriate? Yes; Fluid consistency: Thin  Diet effective now                   Precautions / Restrictions Precautions Precautions: Fall, Posterior Hip Precaution Booklet Issued: Yes (comment) Precaution Comments: Reviewed posterior hip precautions with pt throughout Restrictions Weight Bearing Restrictions: No LLE Weight Bearing: Weight bearing as tolerated    Has the patient had 2 or more falls or a  fall with injury in the past year? Yes   Prior Activity Level Community (5-7x/wk): very active PTA; drove PTA, worked in yard, no AD use PTA   Prior Functional Level Self Care: Did the patient need help bathing, dressing, using the toilet or eating? Independent   Indoor Mobility: Did the patient need assistance with walking from room to room (with or without device)? Independent   Stairs: Did the patient need assistance with internal or  external stairs (with or without device)? Independent   Functional Cognition: Did the patient need help planning regular tasks such as shopping or remembering to take medications? Independent   Home Assistive Devices / Equipment Home Assistive Devices/Equipment: None Home Equipment: Walker - 2 wheels, Shower seat, Bedside commode   Prior Device Use: Indicate devices/aids used by the patient prior to current illness, exacerbation or injury? None of the above   Current Functional Level Cognition   Overall Cognitive Status: Impaired/Different from baseline Orientation Level: Oriented X4 General Comments: Pt with decreased recall of precautions. Very motivated to participate in therapy despite pain    Extremity Assessment (includes Sensation/Coordination)   Upper Extremity Assessment: Defer to OT evaluation  Lower Extremity Assessment: LLE deficits/detail LLE Deficits / Details: Grossly decr AROM and strength, limited by pain postop; Tends to keep hip slightly internally rotated -- will need close guard LLE: Unable to fully assess due to pain     ADLs   Overall ADL's : Needs assistance/impaired Eating/Feeding: Set up, Sitting Grooming: Set up, Sitting Upper Body Bathing: Set up, Supervision/ safety, Sitting Lower Body Bathing: Moderate assistance, Sit to/from stand Upper Body Dressing : Set up, Supervision/safety, Sitting Lower Body Dressing: Maximal assistance, Sit to/from stand Toilet Transfer: Moderate assistance, +2 for safety/equipment, Ambulation, RW(simulated to recliner) Toilet Transfer Details (indicate cue type and reason): Mod A to power up into standing and then gain balance Functional mobility during ADLs: Moderate assistance, +2 for safety/equipment, Rolling walker, Cueing for sequencing General ADL Comments: Pt presenting with decreased balance, strength, and recall of precautions. Pt with high motivation.     Mobility   Overal bed mobility: Needs Assistance Bed  Mobility: Supine to Sit Supine to sit: Min assist General bed mobility comments: cueing for maintaining hip precautions, assistance for trunk elevation     Transfers   Overall transfer level: Needs assistance Equipment used: Rolling walker (2 wheeled) Transfers: Sit to/from Stand Sit to Stand: Min assist, +2 safety/equipment, +2 physical assistance General transfer comment: cueing for safe hand placement, assistance to rise and for stabilitly     Ambulation / Gait / Stairs / Emergency planning/management officer   Ambulation/Gait Ambulation/Gait assistance: Min assist, +2 safety/equipment(chair follow) Gait Distance (Feet): 75 Feet Assistive device: Rolling walker (2 wheeled) Gait Pattern/deviations: Step-to pattern, Decreased step length - right, Decreased step length - left, Decreased stride length, Decreased stance time - left, Decreased weight shift to left General Gait Details: pt with good sequencing with use of RW; pt maintaining proximity to RW and able to WB on L LE as tolerated Gait velocity: decreased     Posture / Balance Balance Overall balance assessment: Needs assistance Sitting-balance support: Feet supported Sitting balance-Leahy Scale: Fair Standing balance support: Bilateral upper extremity supported Standing balance-Leahy Scale: Poor     Special needs/care consideration BiPAP/CPAP : no CPM : no Continuous Drip IV : no Dialysis : no        Days : no Life Vest  : no Oxygen : yes 2L [place] Special Bed : no Lurline Idol  Size : no Wound Vac (area) : no      Location : no Skin: surgical incision to Left hip          Bowel mgmt: continent, last BM 10/13 Bladder mgmt: continent Diabetic mgmt: : no Behavioral consideration : no Chemo/radiation: no    Previous Home Environment (from acute therapy documentation) Living Arrangements: Spouse/significant other(he is caregiver for her) Available Help at Discharge: Family, Available 24 hours/day, Other (Comment)(adult children will assist) Type  of Home: House Home Layout: One level Home Access: Stairs to enter [employer] of Steps: 4 Bathroom Shower/Tub: Multimedia programmer: Handicapped height Home Care Services: No   Discharge Living Setting Plans for Discharge Living Setting: Patient's home, Lives with (comment)(wife) Type of Home at Discharge: House Discharge Home Layout: One level Discharge Home Access: Stairs to enter Entrance Stairs-Rails: Can reach both Entrance Stairs-Number of Steps: 5 Discharge Bathroom Shower/Tub: Walk-in shower Discharge Bathroom Toilet: Standard Discharge Bathroom Accessibility: Yes How Accessible: Accessible via walker Does the patient have any problems obtaining your medications?: No   Social/Family/Support Systems Patient Roles: Spouse Contact Information: wife: [name] [phone]; daughter Gunnar Bulla [phone]) Anticipated Caregiver: wife, daughter and son who do not work  Anticipated Ambulance person Information: see above Ability/Limitations of Caregiver: wife can do supervision; his daugther and son, anticipate Min A Caregiver Availability: 24/7 Discharge Plan Discussed with Primary Caregiver: Yes(wife and Photographer) Is Caregiver In Agreement with Plan?: Yes Does Caregiver/Family have Issues with Lodging/Transportation while Pt is in Rehab?: No   Goals/Additional Needs Patient/Family Goal for Rehab: PT/OT: Mod I; SLP: NA Expected length of stay: 7-10 days Cultural Considerations: NA Dietary Needs: regular diet, thin liquids Equipment Needs: TBD Pt/Family Agrees to Admission and willing to participate: Yes Program Orientation Provided & Reviewed with Pt/Caregiver Including Roles  & Responsibilities: Yes(pt, wife, and daughter Scarlet)  Barriers to Discharge: Home environment Child psychotherapist, Insurance for SNF coverage  Barriers to Discharge Comments: steps to enter home; wife can do supervision (but supportive family with children who live "one city  block away."   Decrease burden of Care through IP rehab admission: NA   Possible need for SNF placement upon discharge: Not anticipated; pt has great family support and has a good prognosis for further recovery through CIR. Anticipate Mod I goals and pt to return home Independently after short CIR stay.    Patient Condition: I have reviewed medical records from [facility], spoken with MD, and patient, spouse and daughter. I met with patient at the bedside for inpatient rehabilitation assessment.  Patient will benefit from ongoing PT and OT, can actively participate in 3 hours of therapy a day 5 days of the week, and can make measurable gains during the admission.  Patient will also benefit from the coordinated team approach during an Inpatient Acute Rehabilitation admission.  The patient will receive intensive therapy as well as Rehabilitation physician, nursing, social worker, and care management interventions.  Due to safety, skin/wound care, disease management, medication administration, pain management and patient education the patient requires 24 hour a day rehabilitation nursing.  The patient is currently Min A +2 for transfers and gait up to 75 feet and set up to Max A for basic ADLs.  Discharge setting and therapy post discharge at home with home health is anticipated.  Patient has agreed to participate in the Acute Inpatient Rehabilitation Program and will admit 10/27/2018.   Preadmission Screen Completed By:  Raechel Ache, 10/27/2018 12:00 PM ______________________________________________________________________   Discussed  status with Dr. Naaman Plummer on 10/27/2018 at 12:00PM and received approval for admission today.   Admission Coordinator:  Raechel Ache, OT, time 12:00PM/Date 10/27/2018    Assessment/Plan: Diagnosis: left FNF s/p THA 1. Does the need for close, 24 hr/day Medical supervision in concert with the patient's rehab needs make it unreasonable for this patient to be  served in a less intensive setting? Yes 2. Co-Morbidities requiring supervision/potential complications: PAF, fever, ABLA 3. Due to bladder management, bowel management, safety, skin/wound care, disease management, medication administration, pain management and patient education, does the patient require 24 hr/day rehab nursing? Yes 4. Does the patient require coordinated care of a physician, rehab nurse, PT, OT, and SLP to address physical and functional deficits in the context of the above medical diagnosis(es)? Yes Addressing deficits in the following areas: balance, endurance, locomotion, strength, transferring, bowel/bladder control, bathing, dressing, feeding, grooming, toileting and psychosocial support 5. Can the patient actively participate in an intensive therapy program of at least 3 hrs of therapy 5 days a week? Yes 6. The potential for patient to make measurable gains while on inpatient rehab is excellent 7. Anticipated functional outcomes upon discharge from inpatient rehab: modified independent PT, modified independent OT, n/a SLP 8. Estimated rehab length of stay to reach the above functional goals is: 7-10 days 9. Anticipated discharge destination: Home 10. Overall Rehab/Functional Prognosis: excellent     MD Signature: Meredith Staggers, MD, [place] [facility] 10/27/2018           Revision History Date/Time User Provider Type Action  10/27/2018 12:39 PM Meredith Staggers, MD Physician Sign  10/27/2018 12:21 PM Meredith Staggers, MD Physician Share  10/27/2018 12:00 PM Raechel Ache, [facility] Admission Coordinator Share   View Details Report

## 2018-10-27 NOTE — Progress Notes (Signed)
Patient refused to eat at this time. Will continue to monitor.   Farley Ly RN

## 2018-10-27 NOTE — Progress Notes (Signed)
Given report to [employer], Therapist, sports.  Farley Ly RN

## 2018-10-27 NOTE — Progress Notes (Addendum)
Patient arrived to room and settled in. Visitor and patient oriented to unit routines.   Lateral side of R ankle has a wound on it. Patient being followed by wound center for care of wound.

## 2018-10-27 NOTE — Progress Notes (Signed)
Physical Therapy Treatment Patient Details Name: [name] MRN: [id number] DOB: [birth year] Today's Date: 10/27/2018    History of Present Illness [name] is a 85 y.o. male with medical history significant of hypertension GERD mild asthma and previous stroke.  Is very active.  He was at home and was getting up some food and tripped over his own feet falling down onto his left side. Now s/p L hip hemiarthroplasty, Posterior Approach, WBAT    PT Comments    Pt making steady progress with mobility, requiring less physical assistance than previous session. He continues to have difficulty with maintaining posterior hip precautions. Plan is for pt to d/c to CIR today.    Follow Up Recommendations  CIR     Equipment Recommendations  Rolling walker with 5" wheels;3in1 (PT)    Recommendations for Other Services       Precautions / Restrictions Precautions Precautions: Fall;Posterior Hip Precaution Comments: Reviewed posterior hip precautions with pt throughout Restrictions Weight Bearing Restrictions: Yes LLE Weight Bearing: Weight bearing as tolerated    Mobility  Bed Mobility Overal bed mobility: Needs Assistance Bed Mobility: Supine to Sit;Sit to Supine     Supine to sit: Min guard Sit to supine: Min guard   General bed mobility comments: increased time and effort, use of bed rails, HOB elevated  Transfers Overall transfer level: Needs assistance Equipment used: Rolling walker (2 wheeled) Transfers: Sit to/from Stand Sit to Stand: Min guard         General transfer comment: good technique, min guard for safety  Ambulation/Gait Ambulation/Gait assistance: Min guard Gait Distance (Feet): 75 Feet Assistive device: Rolling walker (2 wheeled) Gait Pattern/deviations: Step-to pattern;Decreased step length - right;Decreased step length - left;Decreased stride length;Decreased stance time - left;Decreased weight shift to left Gait velocity: decreased   General  Gait Details: pt with good sequencing with use of RW; pt maintaining proximity to RW and able to WB on L LE as tolerated   Stairs             Wheelchair Mobility    Modified Rankin (Stroke Patients Only)       Balance Overall balance assessment: Needs assistance Sitting-balance support: Feet supported Sitting balance-Leahy Scale: Fair     Standing balance support: Bilateral upper extremity supported Standing balance-Leahy Scale: Poor                              Cognition Arousal/Alertness: Awake/alert Behavior During Therapy: WFL for tasks assessed/performed Overall Cognitive Status: Impaired/Different from baseline Area of Impairment: Memory                     Memory: Decreased short-term memory;Decreased recall of precautions                Exercises      General Comments        Pertinent Vitals/Pain Pain Assessment: Faces Faces Pain Scale: Hurts little more Pain Location: L LE Pain Descriptors / Indicators: Discomfort;Grimacing;Guarding Pain Intervention(s): Monitored during session;Repositioned    Home Living                      Prior Function            PT Goals (current goals can now be found in the care plan section) Acute Rehab PT Goals PT Goal Formulation: With patient Time For Goal Achievement: 11/08/18 Potential to Achieve Goals: Good  Progress towards PT goals: Progressing toward goals    Frequency    Min 5X/week      PT Plan Current plan remains appropriate    Co-evaluation              AM-PAC PT "6 Clicks" Mobility   Outcome Measure  Help needed turning from your back to your side while in a flat bed without using bedrails?: A Little Help needed moving from lying on your back to sitting on the side of a flat bed without using bedrails?: A Little Help needed moving to and from a bed to a chair (including a wheelchair)?: A Little Help needed standing up from a chair using your arms  (e.g., wheelchair or bedside chair)?: A Little Help needed to walk in hospital room?: A Little Help needed climbing 3-5 steps with a railing? : A Lot 6 Click Score: 17    End of Session Equipment Utilized During Treatment: Gait belt Activity Tolerance: Patient tolerated treatment well Patient left: in bed;with call bell/phone within reach;with bed alarm set Nurse Communication: Mobility status PT Visit Diagnosis: Other abnormalities of gait and mobility (R26.89);Muscle weakness (generalized) (M62.81);Pain Pain - Right/Left: Left Pain - part of body: Hip     Time: 1339-1401 PT Time Calculation (min) (ACUTE ONLY): 22 min  Charges:  $Gait Training: 8-22 mins                     Sherie Don, [place], DPT  Acute Rehabilitation Services Pager [phone] Office [place] 10/27/2018, 4:42 PM

## 2018-10-27 NOTE — Progress Notes (Signed)
Inpatient Rehabilitation-Admissions Coordinator   I have received insurance approval and medical clearance by Dr. Ree Kida for admit to CIR today.   I have reviewed insurance benefits letter and pt has signed consent forms. RN, CM/SW updated on plan for admit to CIR today.   Please call if questions.   Jhonnie Garner, OTR/L  Rehab Admissions Coordinator  [phone] 10/27/2018 12:09 PM

## 2018-10-27 NOTE — Plan of Care (Signed)
  Problem: Clinical Measurements: Goal: Postoperative complications will be avoided or minimized Outcome: Progressing   

## 2018-10-27 NOTE — Progress Notes (Signed)
Transferred patient to [facility]. Took patient's belongings.   Farley Ly RN

## 2018-10-27 NOTE — Progress Notes (Signed)
Patient up in chair.   Farley Ly RN

## 2018-10-27 NOTE — H&P (Signed)
Physical Medicine and Rehabilitation Admission H&P        Chief Complaint  Patient presents with  . Functional deficits due to hip fracture      HPI:  [name] is an 85 year old male with history of HTN, GERD/esophageal stricture, prostate cancer, PAF, prior stroke who was admitted on 10/24/18 after mechanical fall onto his left side with inability to stand or bear weight on LLE. He was found to have displaced left femoral neck fracture and underwent left hip hemi by Dr. Alvan Dame on the same day. Post op to be WBAT with posterior hip precautions and Eliquis resumed on 10/13. He has had issue with acute renal failure treated with IVF for hydration as well as fever of 101.2 F 10/14 am and treated with fluid bolus.  CXR showed LLE atelectasis with small pleural effusion. UA  Negative/UCS pending.  Therapy ongoing and patient continues to have difficulty remembering hip precautions and pain affecting mobility and ADL tasks. CIR recommended due to functional decline.      Review of Systems  Constitutional: Negative for chills and fever.  HENT: Positive for hearing loss. Negative for tinnitus.   Eyes: Negative for blurred vision and double vision.  Respiratory: Positive for cough and wheezing.   Cardiovascular: Negative for chest pain and palpitations.  Gastrointestinal: Positive for constipation. Negative for heartburn and nausea.  Genitourinary: Positive for frequency and urgency.  Musculoskeletal: Negative for myalgias.  Skin: Negative for itching.  Neurological: Positive for weakness. Negative for dizziness and headaches.  Psychiatric/Behavioral: Negative for memory loss. The patient is not nervous/anxious.             Past Medical History:  Diagnosis Date  . Acute bronchitis 06/10/2015  . Allergic rhinitis    . Anemia    . Asthma    . Carotid stenosis      a. s/p Left CEA 2002;  b. Carotid US 3/16:  patent R CEA, L < 40%  . Chronic pain syndrome      Left shoulder, back   . Diverticulosis    . Dyslipidemia    . Epigastric pain 04/05/2016  . Esophageal stricture      Distal, benign  . GERD (gastroesophageal reflux disease)    . Hemorrhoids    . HTN (hypertension)      Negative renal duplex 07-22-11  . Hx of echocardiogram      a. Echo 10/11: mod LVH, EF 55-60%  . Pneumonia 07-2013  . Prostate cancer ([place]) 2000    Seed XRT  . Spondylosis, cervical, with myelopathy 11/26/2015  . Stroke [facility]) 8/01    right thalamic - on chronic Plavix/ASA           Past Surgical History:  Procedure Laterality Date  . APPENDECTOMY   1953  . CAROTID ENDARTERECTOMY Right 01/2000  . CATARACT EXTRACTION Bilateral    . CERVICAL DISC SURGERY   8/07  . HIP ARTHROPLASTY Left 10/24/2018    Procedure: ARTHROPLASTY BIPOLAR HIP (HEMIARTHROPLASTY);  Surgeon: Paralee Cancel, MD;  Location: [place];  Service: Orthopedics;  Laterality: Left;  . INSERTION PROSTATE RADIATION SEED   2000  . [place] SURGERY   1990s  . NASAL SINUS SURGERY        x 3           Family History  Problem Relation Age of Onset  . Stroke Brother    . Stroke Sister    . Stroke Mother    .  Hyperlipidemia Mother    . Hypertension Mother    . Lung disease Father    . Diabetes Son    . Gout Son    . Obesity Sister    . Alcohol abuse Brother    . Cancer Brother          lung, smoker      Social History:  Married. Independent and active without AD PTA.  He reports that he quit smoking about 40 years ago. His smoking use included cigarettes. He has a 8.75 pack-year smoking history. His smokeless tobacco use includes chew. He reports that he does not drink alcohol or use drugs.           Allergies  Allergen Reactions  . Hctz [Hydrochlorothiazide] Shortness Of Breath, Swelling and Other (See Comments)      "Swelling and dyspnea"    . Hydralazine Shortness Of Breath and Other (See Comments)      Chest pain and GI issues also  . Diovan [Valsartan] Other (See Comments)      Elevated potassium-  Hyperkalemia  . Metformin And Related Other (See Comments)      Reaction not recalled  . Nsaids Other (See Comments)      Other than Tylenol, he isn't to have these because he's on Eliquis  . Other Other (See Comments)      Unnamed topically-applied B/P patch = Caused redness  . Prednisone Other (See Comments)      Suicidal thoughts  . Spironolactone Swelling      Site of swelling not recalled  . Codeine Itching and Nausea Only  . Oxycodone-Acetaminophen Itching and Nausea Only            Medications Prior to Admission  Medication Sig Dispense Refill  . acetaminophen (TYLENOL) 500 MG tablet Take 500 mg by mouth every 6 (six) hours as needed for mild pain or headache.      . albuterol (PROVENTIL HFA;VENTOLIN HFA) 108 (90 Base) MCG/ACT inhaler Inhale 1-2 puffs into the lungs every 6 (six) hours as needed for wheezing or shortness of breath. 1 Inhaler 6  . amLODipine (NORVASC) 5 MG tablet TAKE 1 TABLET BY MOUTH TWO TIMES DAILY (NEEDS OV) (Patient taking differently: Take 5 mg by mouth 2 (two) times daily. ) 180 tablet 3  . apixaban (ELIQUIS) 5 MG TABS tablet Take 1 tablet (5 mg total) by mouth 2 (two) times daily. 180 tablet 3  . cloNIDine (CATAPRES) 0.1 MG tablet TAKE 1 TABLET BY MOUTH THREE TO FOUR TIMES DAILY (Patient taking differently: Take 0.1 mg by mouth See admin instructions. Take 0.1 mg by mouth three times a day and may take an additional fourth dose once daily (0.1 mg) if B/P remains high) 120 tablet 10  . docusate sodium (COLACE) 100 MG capsule Take 100 mg by mouth 2 (two) times daily.       [name] Kitchen gabapentin (NEURONTIN) 300 MG capsule TAKE 1 CAPSULE BY MOUTH 3 TIMES DAILY (Patient taking differently: Take 300 mg by mouth 3 (three) times daily. ) 270 capsule 1  . magnesium hydroxide (MILK OF MAGNESIA) 400 MG/5ML suspension Take 15-30 mLs by mouth daily as needed for mild constipation.      . metoCLOPramide (REGLAN) 5 MG tablet TAKE 1 TABLET BY MOUTH AT BEDTIME (Patient taking  differently: Take 5 mg by mouth at bedtime. ) 30 tablet 5  . minoxidil (LONITEN) 10 MG tablet Take 5-10 mg by mouth See admin instructions. Take 10 mg by mouth  in the morning, 5 mg midday, and 10 mg at bedtime      . pantoprazole (PROTONIX) 40 MG tablet TAKE 1 TABLET (40 MG TOTAL) BY MOUTH DAILY BEFORE BREAKFAST. 90 tablet 0  . polyethylene glycol (MIRALAX / GLYCOLAX) packet Take 17 g by mouth See admin instructions. Mix 17 grams of powder into 8 oz liquid, preferably warm prune juice, and drink at bedtime      . polyvinyl alcohol (ARTIFICIAL TEARS) 1.4 % ophthalmic solution Place 1 drop into both eyes as needed for dry eyes.       . traMADol (ULTRAM) 50 MG tablet TAKE 1 TABLET BY MOUTH EVERY 8 HOURS AS NEEDED (Patient taking differently: Take 50 mg by mouth 3 (three) times daily. ) 75 tablet 0  . triamcinolone cream (KENALOG) 0.5 % Apply 1 application topically daily as needed (for skin irritation).       . fluticasone (FLOVENT HFA) 110 MCG/ACT inhaler Inhale 2 puffs into the lungs 2 (two) times daily. (Patient not taking: Reported on 10/24/2018) 1 Inhaler 0  . mupirocin ointment (BACTROBAN) 2 % Apply thin film twice a day (Patient not taking: Reported on 10/24/2018) 22 g 0      Drug Regimen Review  Drug regimen was reviewed and remains appropriate with no significant issues identified   Home: Home Living Family/patient expects to be discharged to:: Private residence Living Arrangements: Spouse/significant other(he is caregiver for her) Available Help at Discharge: Family, Available 24 hours/day, Other (Comment)(adult children will assist) Type of Home: House Home Access: Stairs to enter [employer] of Steps: 4 Home Layout: One level Bathroom Shower/Tub: Multimedia programmer: Handicapped height Home Equipment: Environmental consultant - 2 wheels, Shower seat, Bedside commode   Functional History: Prior Function Level of Independence: Independent Comments: ADLs, IADLs, and enjoys  working in the yard   Functional Status:  Mobility: Bed Mobility Overal bed mobility: Needs Assistance Bed Mobility: Supine to Sit Supine to sit: Mod assist General bed mobility comments: Cues for technqiue and heavy mod assist to elevate trunk to sit; close guard for prec due to tendency to internally rotate L hip Transfers Overall transfer level: Needs assistance Equipment used: Rolling walker (2 wheeled) Transfers: Sit to/from Stand Sit to Stand: Mod assist, +2 safety/equipment General transfer comment: Mod A for power up into standing and then gaining balance. cues for positioning of LLE. +2 for safety Ambulation/Gait Ambulation/Gait assistance: Min assist, +2 safety/equipment Gait Distance (Feet): 8 Feet Assistive device: Rolling walker (2 wheeled) Gait Pattern/deviations: Step-to pattern General Gait Details: Cues for gait sequence; good use of RW to unweigh painful L hip in stance   ADL: ADL Overall ADL's : Needs assistance/impaired Eating/Feeding: Set up, Sitting Grooming: Set up, Sitting Upper Body Bathing: Set up, Supervision/ safety, Sitting Lower Body Bathing: Moderate assistance, Sit to/from stand Upper Body Dressing : Set up, Supervision/safety, Sitting Lower Body Dressing: Maximal assistance, Sit to/from stand Toilet Transfer: Moderate assistance, +2 for safety/equipment, Ambulation, RW(simulated to recliner) Toilet Transfer Details (indicate cue type and reason): Mod A to power up into standing and then gain balance Functional mobility during ADLs: Moderate assistance, +2 for safety/equipment, Rolling walker, Cueing for sequencing General ADL Comments: Pt presenting with decreased balance, strength, and recall of precautions. Pt with high motivation.   Cognition: Cognition Overall Cognitive Status: Impaired/Different from baseline Orientation Level: Oriented X4 Cognition Arousal/Alertness: Awake/alert Behavior During Therapy: WFL for tasks assessed/performed  Overall Cognitive Status: Impaired/Different from baseline Area of Impairment: Memory Memory: Decreased short-term memory, Decreased  recall of precautions General Comments: Pt with decreased recall of precautions. Very motivated to participate in therapy despite pain     Blood pressure (!) 141/47, pulse 76, temperature 98.6 F (37 C), temperature source Oral, resp. rate 15, height 5\' 11"  (1.803 m), weight 74.8 kg, SpO2 95 %. Physical Exam  Nursing note and vitals reviewed. Constitutional: He is oriented to person, place, and time. He appears well-developed and well-nourished. Nasal cannula in place.  Thin elderly male. Nasal congestion noted. Has increased WOB with conversation.   HENT:  Head: Normocephalic and atraumatic.  Eyes: Pupils are equal, round, and reactive to light. EOM are normal.  Neck: Normal range of motion. No tracheal deviation present. No thyromegaly present.  Cardiovascular: Normal rate and regular rhythm. Exam reveals no friction rub.  No murmur heard. Respiratory: Effort normal. No stridor. No respiratory distress. He has wheezes.  Crackles left base  GI: Soft. He exhibits no distension. There is no abdominal tenderness.  Musculoskeletal:        General: Edema present.     Comments: Min edema left hip with dry surgical dressing.   Neurological: He is alert and oriented to person, place, and time.  Mild hearing loss. Able to answer basic orientation questions but needed calender to recall month. Able to follow simple motor commands without difficulty. UE 4/5. LLE 2-/5 HF, 2/5 KE, 4/5 ADF/PF. RLE 4/5 HF, KE and 5/5 ADF/PF. Sensory exam normal.       Lab Results Last 48 Hours        Results for orders placed or performed during the hospital encounter of 10/23/18 (from the past 48 hour(s))  Surgical pcr screen     Status: None    Collection Time: 10/24/18  1:59 PM    Specimen: Nasal Mucosa; Nasal Swab  Result Value Ref Range    MRSA, PCR NEGATIVE NEGATIVE     Staphylococcus aureus NEGATIVE NEGATIVE      Comment: (NOTE) The Xpert SA Assay (FDA approved for NASAL specimens in patients 29 years of age and older), is one component of a comprehensive surveillance program. It is not intended to diagnose infection nor to guide or monitor treatment. Performed at [facility], [place] [address] [place], [place] [address]    CBC     Status: Abnormal    Collection Time: 10/25/18  1:56 AM  Result Value Ref Range    WBC 11.0 (H) 4.0 - 10.5 K/uL    RBC 3.25 (L) 4.22 - 5.81 MIL/uL    Hemoglobin 9.8 (L) 13.0 - 17.0 g/dL    HCT 29.1 (L) 39.0 - 52.0 %    MCV 89.5 80.0 - 100.0 fL    MCH 30.2 26.0 - 34.0 pg    MCHC 33.7 30.0 - 36.0 g/dL    RDW 13.9 11.5 - 15.5 %    Platelets 189 150 - 400 K/uL    nRBC 0.0 0.0 - 0.2 %      Comment: Performed at [facility], [place] [address] [place], [place] [address]  Basic metabolic panel     Status: Abnormal    Collection Time: 10/25/18  1:56 AM  Result Value Ref Range    Sodium 137 135 - 145 mmol/L    Potassium 4.8 3.5 - 5.1 mmol/L    Chloride 103 98 - 111 mmol/L    CO2 24 22 - 32 mmol/L    Glucose, Bld 268 (H) 70 - 99 mg/dL    BUN 28 (  H) 8 - 23 mg/dL    Creatinine, Ser 1.42 (H) 0.61 - 1.24 mg/dL    Calcium 8.3 (L) 8.9 - 10.3 mg/dL    GFR calc non Af Amer 44 (L) >60 mL/min    GFR calc Af Amer 51 (L) >60 mL/min    Anion gap 10 5 - 15      Comment: Performed at [facility] [address] [place], [place] [address]  Basic metabolic panel     Status: Abnormal    Collection Time: 10/26/18  5:08 AM  Result Value Ref Range    Sodium 137 135 - 145 mmol/L    Potassium 4.5 3.5 - 5.1 mmol/L    Chloride 104 98 - 111 mmol/L    CO2 24 22 - 32 mmol/L    Glucose, Bld 149 (H) 70 - 99 mg/dL    BUN 24 (H) 8 - 23 mg/dL    Creatinine, Ser 1.13 0.61 - 1.24 mg/dL    Calcium 8.1 (L) 8.9 - 10.3 mg/dL    GFR calc non Af Amer 58 (L) >60 mL/min    GFR calc Af Amer >60 >60 mL/min    Anion gap 9 5 - 15       Comment: Performed at [facility] [address] [place], [place] [address]  CBC     Status: Abnormal    Collection Time: 10/26/18  5:08 AM  Result Value Ref Range    WBC 11.9 (H) 4.0 - 10.5 K/uL    RBC 3.12 (L) 4.22 - 5.81 MIL/uL    Hemoglobin 9.4 (L) 13.0 - 17.0 g/dL    HCT 27.9 (L) 39.0 - 52.0 %    MCV 89.4 80.0 - 100.0 fL    MCH 30.1 26.0 - 34.0 pg    MCHC 33.7 30.0 - 36.0 g/dL    RDW 14.0 11.5 - 15.5 %    Platelets 220 150 - 400 K/uL    nRBC 0.0 0.0 - 0.2 %      Comment: Performed at [facility], [place] [address] [place], [place] [address]      Imaging Results (Last 48 hours)  Pelvis Portable   Result Date: 10/24/2018 CLINICAL DATA:  Post left hip arthroplasty. EXAM: PORTABLE PELVIS 1-2 VIEWS COMPARISON:  None. FINDINGS: Patient is post left hip hemiarthroplasty with expected alignment of the native acetabulum and the articulating femoral component. There is a moderate to large joint effusion and intra-articular gas, both expected changes in the postoperative state. Additional overlying postsurgical skin changes are noted as well. Moderate degenerative features are noted in the right hip. Bones of the pelvis remain congruent. Prostate brachytherapy implants are present in the mid pelvis. Vascular calcium noted in the soft tissues. IMPRESSION: Status post left hip hemiarthroplasty without evidence of acute hardware complication. Moderate to large joint effusion and intra-articular gas, both expected changes in the immediate postoperative state. Electronically Signed   By: Lovena Le M.D.   On: 10/24/2018 22:27    Dg Chest Port 1 View   Result Date: 10/26/2018 CLINICAL DATA:  85 year old male with history of fever. EXAM: PORTABLE CHEST 1 VIEW COMPARISON:  Chest x-ray 10/23/2018. FINDINGS: Atelectasis and/or consolidation in the left lung base. Small left pleural effusion. Right lung is clear. No right pleural effusion. No pneumothorax. No definite suspicious appearing  pulmonary nodule or mass. No evidence of pulmonary edema. Mild cardiomegaly. Upper mediastinal contours are within normal limits. Orthopedic fixation hardware in the lower  cervical spine incidentally noted. Surgical clips in the lower right cervical region also noted. IMPRESSION: 1. Left lower lobe atelectasis and/or consolidation with superimposed small left pleural effusion. 2. Mild cardiomegaly. Electronically Signed   By: Vinnie Langton M.D.   On: 10/26/2018 09:44            Medical Problem List and Plan: 1.  Functional and mobility deficits secondary to left FNF and subsequent left hip hemi             -.admit to inpatient rehab 2.  Antithrombotics: -DVT/anticoagulation:  Pharmaceutical: Other (comment)--Eliquis             -antiplatelet therapy: N/A 3. Pain Management: On toradol bid with prn hydrocodone.  4. Mood: LCSW to follow for evaluation and support.              -antipsychotic agents: N/A 5. Neuropsych: This patient is capable of making decisions on his own behalf. 6. Skin/Wound Care: routine pressure relief measures. Monitor wound daily. Protein supplements to help promote wound healing.  7. Fluids/Electrolytes/Nutrition: Strict I/O. Check lytes in am.  8. PAF: Monitor HR tid--on Eliquis and toprol for rate control.  9. Chronic diastolic CHF: Sounds to have overload. Will order CXR. Monitor weight daily. On Toprol, Minoxidil and Lipitor. No longer taking lasix apparently             -consider brief course of lasix 10. HTN: Has been labile but improving. Will monitor BP tid. Continue Norvasc, Clonidine,Toprol and Minoxidil.  11. T2DM with neuropathy: Monitor BS ac/hs.  12. Acute renal failure: Improving after IVF for hydration. Currently on Kerotolac--need to monitor renal status closely 13. ABLA: Continue iron supplement. Monitor for any signs of bleeding. Recheck H/H in am. 14. Fevers with leucocytosis: Has defervesced--monitor for signs of infection. Await UCS and results  of CXR. ?LLL pneumonia             -IS, OOB  15. Constipation: Increase miralax to bid.            Bary Leriche, PA-C 10/26/2018 I have personally performed a face to face diagnostic evaluation of this patient and formulated the key components of the plan.  Additionally, I have personally reviewed laboratory data, imaging studies, as well as relevant notes and concur with the physician assistant's documentation above.  The patient's status has not changed from the original H&P.  Any changes in documentation from the acute care chart have been noted above.  Meredith Staggers, MD, [facility]

## 2018-10-27 NOTE — Discharge Summary (Signed)
Physician Discharge Summary  [name] ZHY:[id number] DOB: August 08, 1929 DOA: 10/23/2018  PCP: Scott Lukes, MD  Admit date: 10/23/2018 Discharge date: 10/27/2018  Time spent: 45 minutes  Recommendations for Outpatient Follow-up:  Patient will be discharged to inpatient rehabilitation.  Will need to follow up with PCP and orthopedics on discharge.    Discharge Diagnoses:  Left hip fracture Fever Acute kidney injury on chronic kidney disease, stage III Essential hypertension History of CVA Paroxysmal atrial fibrillation Chronic diastolic heart failure  Discharge Condition: Stable  Diet recommendation: heart healthy  Filed Weights   10/23/18 2153  Weight: 74.8 kg    History of present illness:  On 10/24/2018 by Dr. Carlynn Purl Harmon a 85 y.o.malewith medical history significant ofhypertension GERD mild asthma and previous stroke. Is very active. He was at home and was getting up some food and tripped over his own feet falling down onto his left side. He had immediate onset of pain in the left hip area and was unable to stand or bear weight.Because of the pain and unable to bear weight he presented to the emergency department for evaluation.  Hospital Course:  Left hip fracture -Secondary to mechanical fall -CT head cervical spine without acute abnormality -Orthopedic surgery consulted and appreciated, status post left hip hemiarthroplasty with Dr. Alvan Harmon 10/24/2018 -Outpatient follow-up with Ortho in 2 weeks for wound check -Continue Eliquis  Fever -Overnight 10/13-14, patient had fever of 101.2 -has been afebrile for over 24 hours -UA unremarkable for infection -Chest x-ray: Left lower lobe atelectasis and/or consolidation superimposed small left pleural effusion -blood cultures negative to date  -Discussed with patient, have asked him to use his incentive spirometer -On examination, patient was on room air with no signs of respiratory  distress -Patient tells me that he has shortness of breath from time to time  Acute kidney injury on chronic kidney disease, stage III -Baseline creatinine approximately 1-1.4 -Creatinine on admission 1.09 however was elevated to 1.42 postoperatively -Patient was placed on IV fluids -Creatinine down to 1.13   Essential hypertension -Continue Norvasc, clonidine, metoprolol, minoxidil  History of CVA -Continue Eliquis, statin  Paroxysmal atrial fibrillation -Continue Eliquis  Chronic diastolic heart failure -No evidence of decompensation -No longer taking Lasix as an outpatient  Consultants Orthopedic Inpatient rehab  Procedures  Left hip hemiarthroplasty  Discharge Exam: Vitals:   10/27/18 0758 10/27/18 0840  BP: (!) 153/49   Pulse: 69   Resp: 16   Temp: 98.8 F (37.1 C)   SpO2: 100% 100%     General: Well developed, well nourished, NAD, appears stated age  HEENT: NCAT, mucous membranes moist.  Cardiovascular: S1 S2 auscultated, regular  Respiratory: Clear to auscultation bilaterally   Abdomen: Soft, nontender, nondistended, + bowel sounds  Extremities: warm dry without cyanosis clubbing or edema  Neuro: AAOx3, nonfocal  Psych: Appropriate mood and affect, pleasant   Discharge Instructions Discharge Instructions    Diet - low sodium heart healthy   Complete by: As directed    Increase activity slowly   Complete by: As directed      Allergies as of 10/27/2018      Reactions   Hctz [hydrochlorothiazide] Shortness Of Breath, Swelling, Other (See Comments)   "Swelling and dyspnea"   Hydralazine Shortness Of Breath, Other (See Comments)   Chest pain and GI issues also   Diovan [valsartan] Other (See Comments)   Elevated potassium- Hyperkalemia   Metformin And Related Other (See Comments)   Reaction not recalled  Nsaids Other (See Comments)   Other than Tylenol, he isn't to have these because he's on Eliquis   Other Other (See Comments)    Unnamed topically-applied B/P patch = Caused redness   Prednisone Other (See Comments)   Suicidal thoughts   Spironolactone Swelling   Site of swelling not recalled   Codeine Itching, Nausea Only   Oxycodone-acetaminophen Itching, Nausea Only      Medication List    TAKE these medications   acetaminophen 500 MG tablet Commonly known as: TYLENOL Take 500 mg by mouth every 6 (six) hours as needed for mild pain or headache.   albuterol 108 (90 Base) MCG/ACT inhaler Commonly known as: VENTOLIN HFA Inhale 1-2 puffs into the lungs every 6 (six) hours as needed for wheezing or shortness of breath.   amLODipine 5 MG tablet Commonly known as: NORVASC TAKE 1 TABLET BY MOUTH TWO TIMES DAILY (NEEDS OV) What changed: See the new instructions.   apixaban 5 MG Tabs tablet Commonly known as: ELIQUIS Take 1 tablet (5 mg total) by mouth 2 (two) times daily.   Artificial Tears 1.4 % ophthalmic solution Generic drug: polyvinyl alcohol Place 1 drop into both eyes as needed for dry eyes.   atorvastatin 40 MG tablet Commonly known as: LIPITOR TAKE 1 TABLET BY MOUTH DAILY.   cloNIDine 0.1 MG tablet Commonly known as: CATAPRES TAKE 1 TABLET BY MOUTH THREE TO FOUR TIMES DAILY What changed:   how much to take  how to take this  when to take this  additional instructions   docusate sodium 100 MG capsule Commonly known as: Colace Take 1 capsule (100 mg total) by mouth 2 (two) times daily.   ferrous sulfate 325 (65 FE) MG tablet Commonly known as: FerrouSul Take 1 tablet (325 mg total) by mouth 3 (three) times daily with meals for 14 days.   fluticasone 110 MCG/ACT inhaler Commonly known as: FLOVENT HFA Inhale 2 puffs into the lungs 2 (two) times daily.   gabapentin 300 MG capsule Commonly known as: NEURONTIN TAKE 1 CAPSULE BY MOUTH 3 TIMES DAILY   magnesium hydroxide 400 MG/5ML suspension Commonly known as: MILK OF MAGNESIA Take 15-30 mLs by mouth daily as needed for mild  constipation.   methocarbamol 500 MG tablet Commonly known as: Robaxin Take 1 tablet (500 mg total) by mouth every 6 (six) hours as needed for muscle spasms.   metoCLOPramide 5 MG tablet Commonly known as: REGLAN TAKE 1 TABLET BY MOUTH AT BEDTIME What changed:   how much to take  how to take this  when to take this  additional instructions   metoprolol succinate 25 MG 24 hr tablet Commonly known as: Toprol XL Take 0.5 tablets (12.5 mg total) by mouth daily. Start taking on: October 28, 2018   minoxidil 10 MG tablet Commonly known as: LONITEN Take 5-10 mg by mouth See admin instructions. Take 10 mg by mouth in the morning, 5 mg midday, and 10 mg at bedtime   mupirocin ointment 2 % Commonly known as: Bactroban Apply thin film twice a day   pantoprazole 40 MG tablet Commonly known as: PROTONIX TAKE 1 TABLET (40 MG TOTAL) BY MOUTH DAILY BEFORE BREAKFAST.   polyethylene glycol 17 g packet Commonly known as: MIRALAX / GLYCOLAX Take 17 g by mouth 2 (two) times daily. What changed:   when to take this  additional instructions   traMADol 50 MG tablet Commonly known as: Ultram Take 1-2 tablets (50-100 mg total) by mouth every  6 (six) hours as needed. What changed:   how much to take  when to take this   triamcinolone cream 0.5 % Commonly known as: KENALOG Apply 1 application topically daily as needed (for skin irritation).      Allergies  Allergen Reactions   Hctz [Hydrochlorothiazide] Shortness Of Breath, Swelling and Other (See Comments)    "Swelling and dyspnea"    Hydralazine Shortness Of Breath and Other (See Comments)    Chest pain and GI issues also   Diovan [Valsartan] Other (See Comments)    Elevated potassium- Hyperkalemia   Metformin And Related Other (See Comments)    Reaction not recalled   Nsaids Other (See Comments)    Other than Tylenol, he isn't to have these because he's on Eliquis   Other Other (See Comments)    Unnamed  topically-applied B/P patch = Caused redness   Prednisone Other (See Comments)    Suicidal thoughts   Spironolactone Swelling    Site of swelling not recalled   Codeine Itching and Nausea Only   Oxycodone-Acetaminophen Itching and Nausea Only   Follow-up Information    Paralee Cancel, MD. Schedule an appointment as soon as possible for a visit in 2 weeks.   Specialty: Orthopedic Surgery Contact information: [address] [place] [address] [place] [address] [phone]        Scott Lukes, MD. Schedule an appointment as soon as possible for a visit in 1 week(s).   Specialty: Family Medicine Why: Hospital follow up Contact information: [place] [address] [phone]            The results of significant diagnostics from this hospitalization (including imaging, microbiology, ancillary and laboratory) are listed below for reference.    Significant Diagnostic Studies: Dg Chest 1 View  Result Date: 10/23/2018 CLINICAL DATA:  Hip fracture EXAM: CHEST  1 VIEW COMPARISON:  03/04/2018 FINDINGS: Patchy scarring or atelectasis at the lingula. No pleural effusion. Borderline to mild cardiomegaly with aortic atherosclerosis. No pneumothorax. IMPRESSION: No active disease.  Patchy atelectasis or scar at the lingula Electronically Signed   By: Donavan Foil M.D.   On: 10/23/2018 22:31   Ct Head Wo Contrast  Result Date: 10/23/2018 CLINICAL DATA:  Fall. EXAM: CT HEAD WITHOUT CONTRAST CT CERVICAL SPINE WITHOUT CONTRAST TECHNIQUE: Multidetector CT imaging of the head and cervical spine was performed following the standard protocol without intravenous contrast. Multiplanar CT image reconstructions of the cervical spine were also generated. COMPARISON:  Cervical spine x-rays dated January 11, 2018. CT head dated August 10, 2013. MRI cervical spine dated September 06, 2004. FINDINGS: CT HEAD FINDINGS Brain: No evidence of acute infarction, hemorrhage,  hydrocephalus, extra-axial collection or mass lesion/mass effect. Small old infarcts in the right thalamus and right occipital lobe again noted. Vascular: Atherosclerotic vascular calcification of the carotid siphons. No hyperdense vessel. Skull: Normal. Negative for fracture or focal lesion. Sinuses/Orbits: No acute finding. Chronic bilateral maxillary sinus disease. Other: None. CT CERVICAL SPINE FINDINGS Alignment: Normal. Skull base and vertebrae: No acute fracture. No primary bone lesion or focal pathologic process. Soft tissues and spinal canal: No prevertebral fluid or swelling. No visible canal hematoma. Disc levels: Prior C4-C6 ACDF with solid interbody fusion. Moderate to severe adjacent segment degenerative disc disease at C6-C7. Mild degenerative disc disease at C2-C3 and C3-C4. Diffuse moderate to severe facet arthropathy throughout the cervical spine. Upper chest: Negative. Other: None. IMPRESSION: 1. No acute intracranial abnormality. Small old infarcts in  the right thalamus and right occipital lobe. 2. No acute cervical spine fracture. Prior C4-C6 ACDF with moderate to severe adjacent segment disease at C6-C7. Electronically Signed   By: Titus Dubin M.D.   On: 10/23/2018 23:07   Ct Cervical Spine Wo Contrast  Result Date: 10/23/2018 CLINICAL DATA:  Fall. EXAM: CT HEAD WITHOUT CONTRAST CT CERVICAL SPINE WITHOUT CONTRAST TECHNIQUE: Multidetector CT imaging of the head and cervical spine was performed following the standard protocol without intravenous contrast. Multiplanar CT image reconstructions of the cervical spine were also generated. COMPARISON:  Cervical spine x-rays dated January 11, 2018. CT head dated August 10, 2013. MRI cervical spine dated September 06, 2004. FINDINGS: CT HEAD FINDINGS Brain: No evidence of acute infarction, hemorrhage, hydrocephalus, extra-axial collection or mass lesion/mass effect. Small old infarcts in the right thalamus and right occipital lobe again noted.  Vascular: Atherosclerotic vascular calcification of the carotid siphons. No hyperdense vessel. Skull: Normal. Negative for fracture or focal lesion. Sinuses/Orbits: No acute finding. Chronic bilateral maxillary sinus disease. Other: None. CT CERVICAL SPINE FINDINGS Alignment: Normal. Skull base and vertebrae: No acute fracture. No primary bone lesion or focal pathologic process. Soft tissues and spinal canal: No prevertebral fluid or swelling. No visible canal hematoma. Disc levels: Prior C4-C6 ACDF with solid interbody fusion. Moderate to severe adjacent segment degenerative disc disease at C6-C7. Mild degenerative disc disease at C2-C3 and C3-C4. Diffuse moderate to severe facet arthropathy throughout the cervical spine. Upper chest: Negative. Other: None. IMPRESSION: 1. No acute intracranial abnormality. Small old infarcts in the right thalamus and right occipital lobe. 2. No acute cervical spine fracture. Prior C4-C6 ACDF with moderate to severe adjacent segment disease at C6-C7. Electronically Signed   By: Titus Dubin M.D.   On: 10/23/2018 23:07   Pelvis Portable  Result Date: 10/24/2018 CLINICAL DATA:  Post left hip arthroplasty. EXAM: PORTABLE PELVIS 1-2 VIEWS COMPARISON:  None. FINDINGS: Patient is post left hip hemiarthroplasty with expected alignment of the native acetabulum and the articulating femoral component. There is a moderate to large joint effusion and intra-articular gas, both expected changes in the postoperative state. Additional overlying postsurgical skin changes are noted as well. Moderate degenerative features are noted in the right hip. Bones of the pelvis remain congruent. Prostate brachytherapy implants are present in the mid pelvis. Vascular calcium noted in the soft tissues. IMPRESSION: Status post left hip hemiarthroplasty without evidence of acute hardware complication. Moderate to large joint effusion and intra-articular gas, both expected changes in the immediate  postoperative state. Electronically Signed   By: Lovena Le M.D.   On: 10/24/2018 22:27   Dg Chest Port 1 View  Result Date: 10/26/2018 CLINICAL DATA:  85 year old male with history of fever. EXAM: PORTABLE CHEST 1 VIEW COMPARISON:  Chest x-ray 10/23/2018. FINDINGS: Atelectasis and/or consolidation in the left lung base. Small left pleural effusion. Right lung is clear. No right pleural effusion. No pneumothorax. No definite suspicious appearing pulmonary nodule or mass. No evidence of pulmonary edema. Mild cardiomegaly. Upper mediastinal contours are within normal limits. Orthopedic fixation hardware in the lower cervical spine incidentally noted. Surgical clips in the lower right cervical region also noted. IMPRESSION: 1. Left lower lobe atelectasis and/or consolidation with superimposed small left pleural effusion. 2. Mild cardiomegaly. Electronically Signed   By: Vinnie Langton M.D.   On: 10/26/2018 09:44   Dg Hip Unilat With Pelvis 2-3 Views Left  Result Date: 10/23/2018 CLINICAL DATA:  Fall with left-sided hip pain EXAM: DG HIP (WITH OR  WITHOUT PELVIS) 2-3V LEFT COMPARISON:  CT 07/03/2016 FINDINGS: SI joints are non widened. Pubic symphysis and rami appear intact. Prostate seeds are noted. Right femoral head projects in joint. Moderate degenerative changes of the right hip. Acute left femoral neck fracture with mild cephalad migration of the trochanter. Left femoral head projects in joint. IMPRESSION: Acute mildly displaced left femoral neck fracture Electronically Signed   By: Donavan Foil M.D.   On: 10/23/2018 22:30    Microbiology: Recent Results (from the past 240 hour(s))  SARS CORONAVIRUS 2 (TAT 6-24 HRS) Nasopharyngeal Nasopharyngeal Swab     Status: None   Collection Time: 10/23/18 11:20 PM   Specimen: Nasopharyngeal Swab  Result Value Ref Range Status   SARS Coronavirus 2 NEGATIVE NEGATIVE Final    Comment: (NOTE) SARS-CoV-2 target nucleic acids are NOT DETECTED. The  SARS-CoV-2 RNA is generally detectable in upper and lower respiratory specimens during the acute phase of infection. Negative results do not preclude SARS-CoV-2 infection, do not rule out co-infections with other pathogens, and should not be used as the sole basis for treatment or other patient management decisions. Negative results must be combined with clinical observations, patient history, and epidemiological information. The expected result is Negative. Fact Sheet for Patients: SugarRoll.be Fact Sheet for Healthcare Providers: https://www.woods-mathews.com/ This test is not yet approved or cleared by the [place] FDA and  has been authorized for detection and/or diagnosis of SARS-CoV-2 by FDA under an Emergency Use Authorization (EUA). This EUA will remain  in effect (meaning this test can be used) for the duration of the COVID-19 declaration under Section 56 4(b)(1) of the Act, 21 U.S.C. section 360bbb-3(b)(1), unless the authorization is terminated or revoked sooner. Performed at [facility], [place] [address] [place], [place] [address]   Surgical pcr screen     Status: None   Collection Time: 10/24/18  1:59 PM   Specimen: Nasal Mucosa; Nasal Swab  Result Value Ref Range Status   MRSA, PCR NEGATIVE NEGATIVE Final   Staphylococcus aureus NEGATIVE NEGATIVE Final    Comment: (NOTE) The Xpert SA Assay (FDA approved for NASAL specimens in patients 65 years of age and older), is one component of a comprehensive surveillance program. It is not intended to diagnose infection nor to guide or monitor treatment. Performed at [facility], [place] [address], [place], [place] [address]   Culture, blood (Routine X 2) w Reflex to ID Panel     Status: None (Preliminary result)   Collection Time: 10/26/18  6:04 AM   Specimen: BLOOD LEFT HAND  Result Value Ref Range Status   Specimen Description BLOOD LEFT HAND  Final   Special  Requests   Final    BOTTLES DRAWN AEROBIC ONLY Blood Culture adequate volume   Culture   Final    NO GROWTH 1 DAY Performed at [facility], [place] [address] [place], [place] [address]    Report Status PENDING  Incomplete  Culture, blood (Routine X 2) w Reflex to ID Panel     Status: None (Preliminary result)   Collection Time: 10/26/18  6:05 AM   Specimen: BLOOD  Result Value Ref Range Status   Specimen Description BLOOD LEFT ANTECUBITAL  Final   Special Requests   Final    BOTTLES DRAWN AEROBIC ONLY Blood Culture adequate volume   Culture   Final    NO GROWTH 1 DAY Performed at [facility], [place] [address] [place],  [address]  Report Status PENDING  Incomplete     Labs: Basic Metabolic Panel: Recent Labs  Lab 10/23/18 2300 10/25/18 0156 10/26/18 0508  NA 141 137 137  K 4.8 4.8 4.5  CL 106 103 104  CO2 26 24 24   GLUCOSE 118* 268* 149*  BUN 22 28* 24*  CREATININE 1.09 1.42* 1.13  CALCIUM 8.8* 8.3* 8.1*   Liver Function Tests: No results for input(s): AST, ALT, ALKPHOS, BILITOT, PROT, ALBUMIN in the last 168 hours. No results for input(s): LIPASE, AMYLASE in the last 168 hours. No results for input(s): AMMONIA in the last 168 hours. CBC: Recent Labs  Lab 10/23/18 2300 10/25/18 0156 10/26/18 0508 10/27/18 1039  WBC 9.7 11.0* 11.9* 10.1  NEUTROABS 8.0*  --   --   --   HGB 12.2* 9.8* 9.4* 8.8*  HCT 35.2* 29.1* 27.9* 26.0*  MCV 89.1 89.5 89.4 89.0  PLT 245 189 220 228   Cardiac Enzymes: No results for input(s): CKTOTAL, CKMB, CKMBINDEX, TROPONINI in the last 168 hours. BNP: BNP (last 3 results) No results for input(s): BNP in the last 8760 hours.  ProBNP (last 3 results) Recent Labs    09/07/18 0948  PROBNP 1,352*    CBG: No results for input(s): GLUCAP in the last 168 hours.     Signed:  Cristal Ford  [facility] 10/27/2018, 12:33 PM

## 2018-10-27 NOTE — Progress Notes (Signed)
Patient's O2 while lying in bed was 95 on RA. Patient refusing to ambulate at this time. Patient now back on oxygen because he was feeling short of breath.  Farley Ly RN

## 2018-10-28 ENCOUNTER — Inpatient Hospital Stay (HOSPITAL_COMMUNITY): Payer: PPO | Admitting: Occupational Therapy

## 2018-10-28 ENCOUNTER — Inpatient Hospital Stay (HOSPITAL_COMMUNITY): Payer: PPO

## 2018-10-28 DIAGNOSIS — K5903 Drug induced constipation: Secondary | ICD-10-CM

## 2018-10-28 DIAGNOSIS — Z96642 Presence of left artificial hip joint: Secondary | ICD-10-CM | POA: Insufficient documentation

## 2018-10-28 DIAGNOSIS — S72092G Other fracture of head and neck of left femur, subsequent encounter for closed fracture with delayed healing: Secondary | ICD-10-CM

## 2018-10-28 DIAGNOSIS — R531 Weakness: Secondary | ICD-10-CM

## 2018-10-28 DIAGNOSIS — R062 Wheezing: Secondary | ICD-10-CM

## 2018-10-28 DIAGNOSIS — N3943 Post-void dribbling: Secondary | ICD-10-CM

## 2018-10-28 LAB — COMPREHENSIVE METABOLIC PANEL
ALT: 8 U/L (ref 0–44)
AST: 17 U/L (ref 15–41)
Albumin: 2.6 g/dL — ABNORMAL LOW (ref 3.5–5.0)
Alkaline Phosphatase: 36 U/L — ABNORMAL LOW (ref 38–126)
Anion gap: 5 (ref 5–15)
BUN: 21 mg/dL (ref 8–23)
CO2: 25 mmol/L (ref 22–32)
Calcium: 8 mg/dL — ABNORMAL LOW (ref 8.9–10.3)
Chloride: 109 mmol/L (ref 98–111)
Creatinine, Ser: 1.05 mg/dL (ref 0.61–1.24)
GFR calc Af Amer: >60 mL/min (ref >60)
GFR calc non Af Amer: >60 mL/min (ref >60)
Glucose, Bld: 120 mg/dL — ABNORMAL HIGH (ref 70–99)
Potassium: 5.1 mmol/L (ref 3.5–5.1)
Sodium: 139 mmol/L (ref 135–145)
Total Bilirubin: 0.8 mg/dL (ref 0.3–1.2)
Total Protein: 5.2 g/dL — ABNORMAL LOW (ref 6.5–8.1)

## 2018-10-28 LAB — CBC WITH DIFFERENTIAL/PLATELET
Abs Immature Granulocytes: 0.05 10*3/uL (ref 0.00–0.07)
Basophils Absolute: 0 10*3/uL (ref 0.0–0.1)
Basophils Relative: 0 %
Eosinophils Absolute: 0.3 10*3/uL (ref 0.0–0.5)
Eosinophils Relative: 4 %
HCT: 24.8 % — ABNORMAL LOW (ref 39.0–52.0)
Hemoglobin: 8.6 g/dL — ABNORMAL LOW (ref 13.0–17.0)
Immature Granulocytes: 1 %
Lymphocytes Relative: 11 %
Lymphs Abs: 0.9 10*3/uL (ref 0.7–4.0)
MCH: 31 pg (ref 26.0–34.0)
MCHC: 34.7 g/dL (ref 30.0–36.0)
MCV: 89.5 fL (ref 80.0–100.0)
Monocytes Absolute: 0.8 10*3/uL (ref 0.1–1.0)
Monocytes Relative: 10 %
Neutro Abs: 6 10*3/uL (ref 1.7–7.7)
Neutrophils Relative %: 74 %
Platelets: 228 10*3/uL (ref 150–400)
RBC: 2.77 MIL/uL — ABNORMAL LOW (ref 4.22–5.81)
RDW: 13.5 % (ref 11.5–15.5)
WBC: 8.1 10*3/uL (ref 4.0–10.5)
nRBC: 0 % (ref 0.0–0.2)

## 2018-10-28 LAB — URINE CULTURE: Culture: 80000 — AB

## 2018-10-28 MED ORDER — MAGNESIUM CITRATE PO SOLN
1.0000 | Freq: Once | ORAL | Status: AC
  Administered 2018-10-28: 1 via ORAL
  Filled 2018-10-28: qty 296

## 2018-10-28 NOTE — Progress Notes (Signed)
Patient information reviewed and entered into [facility] by Becky Sybella Harnish, PPS coordinator. Information including medical coding, function ability, and quality indicators will be reviewed and updated through discharge.   

## 2018-10-28 NOTE — Evaluation (Signed)
Occupational Therapy Assessment and Plan  Patient Details  Name: [name] MRN: [id number] Date of Birth: 04-Jul-1929  OT Diagnosis: acute pain, muscle weakness (generalized) and pain in joint Rehab Potential: Rehab Potential (ACUTE ONLY): Good ELOS: 7-10 days   Today's Date: 10/28/2018 OT Individual Time: 0177-9390 OT Individual Time Calculation (min): 70 min     Problem List:  Patient Active Problem List   Diagnosis Date Noted  . Femur fracture, left ([place]) 10/27/2018  . Hip fracture ([place]) 10/23/2018  . Hip fracture, left ([place]) 10/23/2018  . Ankle pain 01/13/2018  . Neck pain 01/13/2018  . Cellulitis of right lower extremity 01/13/2018  . Constipation 01/11/2018  . Low back pain 01/11/2018  . Dysuria 12/06/2016  . Weakness 12/06/2016  . 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 Preventative health care 12/06/2016  . Hyperglycemia 04/05/2016  . Epigastric pain 04/05/2016  . Spondylosis, cervical, with myelopathy 11/26/2015  . Cervicogenic headache 11/26/2015  . Umbilical hernia 30/09/2328  . Asthma 10/08/2011  . Carotid stenosis 03/31/2011  . Chronic pain   . Vitamin D deficiency 12/11/2008  . HLD (hyperlipidemia) 12/11/2008  . Anemia 12/11/2008  . Cerebral artery occlusion with cerebral infarction ([place]) 12/11/2008  . URINARY INCONTINENCE, MALE 12/11/2008  . Disorder of bursae and tendons in shoulder region 12/29/2007  . ANXIETY, MILD 08/09/2007  . HYPERTENSION, BENIGN ESSENTIAL 01/04/2007  . Allergic rhinitis 01/04/2007  . HEADACHE, TENSION 11/05/2006  . PROSTATE CANCER, HX OF 11/05/2006    Past Medical History:  Past Medical History:  Diagnosis Date  . Acute bronchitis 06/10/2015  . Allergic rhinitis   . Anemia   . Asthma   . Carotid stenosis    a. s/p Left CEA 2002;  b. Carotid US 3/16:  patent R CEA, L < 40%  . Chronic pain syndrome    Left shoulder, back  . Diverticulosis   . Dyslipidemia   . Epigastric pain 04/05/2016  . Esophageal stricture     Distal, benign  . GERD (gastroesophageal reflux disease)   . Hemorrhoids   . HTN (hypertension)    Negative renal duplex 07-22-11  . Hx of echocardiogram    a. Echo 10/11: mod LVH, EF 55-60%  . Pneumonia 07-2013  . Prostate cancer ([place]) 2000   Seed XRT  . Spondylosis, cervical, with myelopathy 11/26/2015  . Stroke [facility]) 8/01   right thalamic - on chronic Plavix/ASA   Past Surgical History:  Past Surgical History:  Procedure Laterality Date  . APPENDECTOMY  1953  . CAROTID ENDARTERECTOMY Right 01/2000  . CATARACT EXTRACTION Bilateral   . CERVICAL DISC SURGERY  8/07  . HIP ARTHROPLASTY Left 10/24/2018   Procedure: ARTHROPLASTY BIPOLAR HIP (HEMIARTHROPLASTY);  Surgeon: Paralee Cancel, MD;  Location: [place];  Service: Orthopedics;  Laterality: Left;  . INSERTION PROSTATE RADIATION SEED  2000  . [place] SURGERY  1990s  . NASAL SINUS SURGERY     x 3    Assessment & Plan Clinical Impression: Patient is a 85 y.o. year old male with history of HTN, GERD/esophageal stricture, prostate cancer, PAF, prior stroke who was admitted on 10/24/18 after mechanical fall onto his left side with inability to stand or bear weight on LLE. He was found to have displaced left femoral neck fracture and underwent left hip hemi by Dr. Alvan Dame on the same day. Post op to be WBAT with posterior hip precautions and Eliquis resumed on 10/13. He has had issue with acute renal failure treated with IVF for  hydration as well as feverof 101.2 F10/14 am and treated with fluid bolus.CXR showed LLE atelectasis with small pleural effusion.UA Negative/UCS pending. Therapy ongoing and patient continues to have difficulty remembering hip precautions and pain affecting mobility and ADL tasks. CIR recommended due to functional decline.    Patient transferred to CIR on 10/27/2018 .    Patient currently requires max with basic self-care skills secondary to muscle weakness and muscle joint tightness, decreased  cardiorespiratoy endurance, decreased problem solving and decreased memory and decreased balance strategies and difficulty maintaining precautions.  Prior to hospitalization, patient could complete ADLs with independent .  Patient will benefit from skilled intervention to increase independence with basic self-care skills prior to discharge home with care partner.  Anticipate patient will require intermittent supervision and follow up home health.  OT - End of Session Activity Tolerance: Tolerates 30+ min activity with multiple rests Endurance Deficit: Yes Endurance Deficit Description: fatigues quickly OT Assessment Rehab Potential (ACUTE ONLY): Good OT Patient demonstrates impairments in the following area(s): Balance;Endurance;Motor;Pain;Safety OT Basic ADL's Functional Problem(s): Grooming;Bathing;Dressing;Toileting OT Transfers Functional Problem(s): Toilet;Tub/Shower OT Additional Impairment(s): None OT Plan OT Intensity: Minimum of 1-2 x/day, 45 to 90 minutes OT Frequency: 5 out of 7 days OT Duration/Estimated Length of Stay: 7-10 days OT Treatment/Interventions: Balance/vestibular training;Community reintegration;Discharge planning;Disease mangement/prevention;DME/adaptive equipment instruction;Functional mobility training;Pain management;Patient/family education;Psychosocial support;Self Care/advanced ADL retraining;Skin care/wound managment;Therapeutic Activities;Therapeutic Exercise;UE/LE Strength taining/ROM OT Basic Self-Care Anticipated Outcome(s): Mod I OT Toileting Anticipated Outcome(s): Mod I OT Bathroom Transfers Anticipated Outcome(s): Mod I OT Recommendation Patient destination: Home Follow Up Recommendations: Home health OT Equipment Recommended: None recommended by OT   Skilled Therapeutic Intervention OT eval completed with discussion of rehab process, OT purpose, POC, ELOS, and goals.  ADL assessment completed with bathing and dressing at sit > stand level at  sink.  Pt completed bed mobility requiring mod assist to complete sidelying to supine and cues to obtain to posterior hip precautions.  Completed initial stand pivot mod assist, each additional transfer min assist throughout session.  Pt able to complete UB bathing and dressing with setup assist and required max assist for LB to adhere to posterior hip precautions.  Pt able to recall 1/3 hip precautions initially and 2/3 by end of session.    OT Evaluation Precautions/Restrictions  Precautions Precautions: Fall;Posterior Hip Precaution Booklet Issued: Yes (comment) Precaution Comments: Reviewed posterior hip precautions with pt throughout Restrictions Weight Bearing Restrictions: Yes LLE Weight Bearing: Weight bearing as tolerated Vital Signs Oxygen Therapy O2 Device: Room Air Pain  Pt with no c/o pain Home Living/Prior Functioning Home Living Family/patient expects to be discharged to:: Private residence Living Arrangements: Spouse/significant other Available Help at Discharge: Family, Available 24 hours/day, Other (Comment)(adult children will assist) Type of Home: House Home Access: Stairs to enter [employer] of Steps: 4-5 Entrance Stairs-Rails: Right, Left, Can reach both Home Layout: One level Bathroom Shower/Tub: Multimedia programmer: Handicapped height  Lives With: Spouse IADL History Homemaking Responsibilities: Yes Meal Prep Responsibility: Secondary Shopping Responsibility: Primary Prior Function Level of Independence: Independent with basic ADLs, Independent with homemaking with ambulation, Independent with gait  Able to Take Stairs?: Yes Driving: Yes Comments: enjoys working in the yard ADL ADL Grooming: Setup Where Assessed-Grooming: Sitting at sink Upper Body Bathing: Setup Where Assessed-Upper Body Bathing: Sitting at sink Lower Body Bathing: Moderate assistance Where Assessed-Lower Body Bathing: Sitting at sink, Standing at  sink Upper Body Dressing: Setup Where Assessed-Upper Body Dressing: Sitting at sink Lower Body Dressing: Maximal assistance  Where Assessed-Lower Body Dressing: Sitting at sink, Standing at sink Toilet Transfer: Minimal assistance Toilet Transfer Method: Stand pivot Toilet Transfer Equipment: Grab bars Vision Baseline Vision/History: Wears glasses Wears Glasses: Reading only(and driving) Vision Assessment?: No apparent visual deficits Cognition Overall Cognitive Status: Within Functional Limits for tasks assessed Arousal/Alertness: Awake/alert Orientation Level: Person;Place;Situation Person: Oriented Place: Oriented Situation: Oriented Year: 2020 Month: October Day of Week: Incorrect(Thursday) Memory: Appears intact Immediate Memory Recall: Sock;Blue;Bed Memory Recall Sock: Not able to recall Memory Recall Blue: Without Cue Memory Recall Bed: Without Cue Attention: Alternating;Selective Selective Attention: Appears intact Awareness: Appears intact Problem Solving: Appears intact Safety/Judgment: Appears intact Comments: does require cues to obtain to posterior hip precautions during mobility and self-care tasks Sensation Sensation Light Touch: Appears Intact Proprioception: Appears Intact Coordination Fine Motor Movements are Fluid and Coordinated: No(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) Finger Nose Finger Test: mild shakiness with LUE (due to premorbid CVA in 2001) Mobility  Bed Mobility Bed Mobility: Rolling Left;Left Sidelying to Sit Rolling Left: Minimal Assistance - Patient > 75% Left Sidelying to Sit: Moderate Assistance - Patient 50-74% Transfers Sit to Stand: Minimal Assistance - Patient > 75% Stand to Sit: Contact Guard/Touching assist  Extremity/Trunk Assessment RUE Assessment RUE Assessment: Within Functional Limits General Strength Comments: grossly 4/5 LUE Assessment LUE Assessment: Within Functional Limits General Strength Comments:  grossly 4/5     Refer to Care Plan for Long Term Goals  Recommendations for other services: None    Discharge Criteria: Patient will be discharged from OT if patient refuses treatment 3 consecutive times without medical reason, if treatment goals not met, if there is a change in medical status, if patient makes no progress towards goals or if patient is discharged from hospital.  The above assessment, treatment plan, treatment alternatives and goals were discussed and mutually agreed upon: by patient  Ellwood Dense [facility] 10/28/2018, 11:10 AM

## 2018-10-28 NOTE — Progress Notes (Signed)
Social Work Assessment and Plan   Patient Details  Name: [name] MRN: [id number] Date of Birth: March 09, 1929  Today's Date: 10/28/2018  Problem List:  Patient Active Problem List   Diagnosis Date Noted  . Femur fracture, left ([place]) 10/27/2018  . Hip fracture ([place]) 10/23/2018  . Hip fracture, left ([place]) 10/23/2018  . Ankle pain 01/13/2018  . Neck pain 01/13/2018  . Cellulitis of right lower extremity 01/13/2018  . Constipation 01/11/2018  . Low back pain 01/11/2018  . Dysuria 12/06/2016  . Weakness 12/06/2016  . 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 Preventative health care 12/06/2016  . Hyperglycemia 04/05/2016  . Epigastric pain 04/05/2016  . Spondylosis, cervical, with myelopathy 11/26/2015  . Cervicogenic headache 11/26/2015  . Umbilical hernia 63/01/6008  . Asthma 10/08/2011  . Carotid stenosis 03/31/2011  . Chronic pain   . Vitamin D deficiency 12/11/2008  . HLD (hyperlipidemia) 12/11/2008  . Anemia 12/11/2008  . Cerebral artery occlusion with cerebral infarction ([place]) 12/11/2008  . URINARY INCONTINENCE, MALE 12/11/2008  . Disorder of bursae and tendons in shoulder region 12/29/2007  . ANXIETY, MILD 08/09/2007  . HYPERTENSION, BENIGN ESSENTIAL 01/04/2007  . Allergic rhinitis 01/04/2007  . HEADACHE, TENSION 11/05/2006  . PROSTATE CANCER, HX OF 11/05/2006   Past Medical History:  Past Medical History:  Diagnosis Date  . Acute bronchitis 06/10/2015  . Allergic rhinitis   . Anemia   . Asthma   . Carotid stenosis    a. s/p Left CEA 2002;  b. Carotid US 3/16:  patent R CEA, L < 40%  . Chronic pain syndrome    Left shoulder, back  . Diverticulosis   . Dyslipidemia   . Epigastric pain 04/05/2016  . Esophageal stricture    Distal, benign  . GERD (gastroesophageal reflux disease)   . Hemorrhoids   . HTN (hypertension)    Negative renal duplex 07-22-11  . Hx of echocardiogram    a. Echo 10/11: mod LVH, EF 55-60%  . Pneumonia 07-2013  . Prostate  cancer ([place]) 2000   Seed XRT  . Spondylosis, cervical, with myelopathy 11/26/2015  . Stroke [facility]) 8/01   right thalamic - on chronic Plavix/ASA   Past Surgical History:  Past Surgical History:  Procedure Laterality Date  . APPENDECTOMY  1953  . CAROTID ENDARTERECTOMY Right 01/2000  . CATARACT EXTRACTION Bilateral   . CERVICAL DISC SURGERY  8/07  . HIP ARTHROPLASTY Left 10/24/2018   Procedure: ARTHROPLASTY BIPOLAR HIP (HEMIARTHROPLASTY);  Surgeon: Paralee Cancel, MD;  Location: [place];  Service: Orthopedics;  Laterality: Left;  . INSERTION PROSTATE RADIATION SEED  2000  . [place] SURGERY  1990s  . NASAL SINUS SURGERY     x 3   Social History:  reports that he quit smoking about 40 years ago. His smoking use included cigarettes. He has a 8.75 pack-year smoking history. His smokeless tobacco use includes chew. He reports that he does not drink alcohol or use drugs.  Family / Support Systems Marital Status: Married Patient Roles: Spouse, Parent Spouse/Significant Other: [name] [phone]  [phone] Children: Scarlet Kooke-daughter [phone] Other Supports: Friends and church members Anticipated Caregiver: Wife and two daughter's also son Ability/Limitations of Caregiver: Wife is able to do supervision and children can check intermittently on all work Caregiver Availability: 24/7 Family Dynamics: Close knit with all three children who are local and come by and check on them. Wife is able to take care of herself and can assist him if needed. She doesn;t  drive  Social History Preferred language: English Religion: Baptist Cultural Background: No issues Education: HS Read: Yes Write: Yes Employment Status: Retired Public relations account executive Issues: No issues Guardian/Conservator: None-according to MD pt is capable of making his own decisions while here   Abuse/Neglect Abuse/Neglect Assessment Can Be Completed: Yes Physical Abuse: Denies Verbal Abuse: Denies Sexual  Abuse: Denies Exploitation of patient/patient's resources: Denies Self-Neglect: Denies  Emotional Status Pt's affect, behavior and adjustment status: Pt is motivated to make progress and recover from his fall and hip fracture. He feels he is doing well and hopes to be able to go home on a walker independently when he is discharge. Wife can assist but not any physical assist Recent Psychosocial Issues: other health issues-managed by MD's Psychiatric History: No history doing well with adjusting to his hospitalization and glad to be here on rehab. Do not feel he needs to be seen by neruo-psych while here, coping appropriately Substance Abuse History: No issues  Patient / Family Perceptions, Expectations & Goals Pt/Family understanding of illness & functional limitations: Pt and wife can explain his hip fracture and WB issues. Both realize he needs time to heal and learn how ot use a walker. He does talk wth the MD and feels he has a good understanding of his treatment plan going forward. Premorbid pt/family roles/activities: husband, father, grandfather, retiree, church member, etc Anticipated changes in roles/activities/participation: resume Pt/family expectations/goals: Pt states: " I hope to be using a walker on my own my wife can't help me."  Wife states: " We will manage and he should be doing well when he leaves here."  [employer]: None Premorbid Home Care/DME Agencies: Other (Comment)(has rw, tub seat, and bsc) Transportation available at discharge: Children wife doesn't drive  Discharge Planning Living Arrangements: Spouse/significant other Support Systems: Spouse/significant other, Children, Friends/neighbors, Church/faith community Type of Residence: Private residence Insurance Resources: Multimedia programmer (specify)([facility]) Financial Resources: [place] Referred: No Living Expenses: Own Money Management:  Spouse, Patient Does the patient have any problems obtaining your medications?: No Home Management: he does the outside work and wife does the inside work Magazine features editor Plans: Return home with wife who can provide supervision level, but no physical assist. They have three children who are invlovled but all work. Their daughter-Scarlet is a ER-RN here. Will await team evaluations and work on discharge needs. Social Work Anticipated Follow Up Needs: HH/OP  Clinical Impression Pleasant gentleman who is motivated and has always been very independent and active prior to this event. Will await team's evaluations and work on discharge needs. Wife can assist as long as not  Physical care, she is able to care for herself and pt was not taking care of her prior to admission. Pt is coping appropriately does not need to see neuro-psych while here.  Elease Hashimoto 10/28/2018, 11:01 AM

## 2018-10-28 NOTE — Progress Notes (Signed)
[facility] PHYSICAL MEDICINE & REHABILITATION PROGRESS NOTE   Subjective/Complaints:  Pt reports blood thinners make him cold "all the time" pain "pretty reasonable" with tramadol q6 hours prn and tylenol- takes them together.  Says poor appetite for past few days is somewhat better this AM; LBM 5 DAYS ago.    Objective:   Dg Chest 2 View  Result Date: 10/27/2018 CLINICAL DATA:  Wheezing on expiration EXAM: CHEST - 2 VIEW COMPARISON:  Chest radiograph from one day prior. FINDINGS: Stable cardiomediastinal silhouette with top-normal heart size. No pneumothorax. No pleural effusion. No overt pulmonary edema. Hazy opacity at both lung bases. Hyperinflated lungs. IMPRESSION: 1. Hyperinflated lungs, suggesting obstructive lung disease. 2. Hazy opacities at both lung bases, favor atelectasis, difficult to exclude a component of aspiration or pneumonia. Chest radiograph follow-up suggested. Electronically Signed   By: Ilona Sorrel M.D.   On: 10/27/2018 16:57   Recent Labs    10/27/18 1039 10/28/18 0534  WBC 10.1 8.1  HGB 8.8* 8.6*  HCT 26.0* 24.8*  PLT 228 228   Recent Labs    10/26/18 0508 10/28/18 0534  NA 137 139  K 4.5 5.1  CL 104 109  CO2 24 25  GLUCOSE 149* 120*  BUN 24* 21  CREATININE 1.13 1.05  CALCIUM 8.1* 8.0*    Intake/Output Summary (Last 24 hours) at 10/28/2018 1349 Last data filed at 10/28/2018 0700 Gross per 24 hour  Intake 120 ml  Output -  Net 120 ml     Physical Exam: Vital Signs Blood pressure (!) 160/59, pulse 66, temperature 98.3 F (36.8 C), temperature source Oral, resp. rate 16, height 5\' 11"  (1.803 m), weight 80.1 kg, SpO2 95 %.   Physical Exam Nursing note, labs, and vitalsreviewed. Constitutional: awake, alert, appropriate, slightly cachetic, no SOB noted/no accessory muscle use, no O2 by [place], NAD HENT:  Head:Normocephalicand atraumatic.  Eyes:conjugate gaze Neck:Normal range of motion.  Cardiovascular:RRR Respiratory: CTA  B/L except a few exp wheezes at bases B/L YQ:IHKV. NT, slightly distended vs protuberant; hypoactive BS Musculoskeletal:  General: Edemapresent.  Comments: Min edema left hip with dry surgical dressing- original dressing- no drainage/seepage on dressing Neurological: He is alertand oriented to person, place, and time. Mild hearing loss. Able to answer basic orientation questions but needed calender to recall month. Able to follow simple motor commands without difficulty. UE 4/5. LLE 2-/5 HF, 2/5 KE, 4/5 ADF/PF. RLE 4/5 HF, KE and 5/5 ADF/PF. Sensory exam normal.    Assessment/Plan: 1. Functional deficits secondary to L hemiarthroplasty of L hip due to L femoral neck fracture  which require 3+ hours per day of interdisciplinary therapy in a comprehensive inpatient rehab setting.  Physiatrist is providing close team supervision and 24 hour management of active medical problems listed below.  Physiatrist and rehab team continue to assess barriers to discharge/monitor patient progress toward functional and medical goals  Care Tool:  Bathing    Body parts bathed by patient: Right arm, Left arm, Chest, Abdomen, Front perineal area, Buttocks, Face   Body parts bathed by helper: Left lower leg, Right lower leg, Right upper leg, Left upper leg     Bathing assist Assist Level: Moderate Assistance - Patient 50 - 74%     Upper Body Dressing/Undressing Upper body dressing   What is the patient wearing?: Pull over shirt    Upper body assist Assist Level: Set up assist    Lower Body Dressing/Undressing Lower body dressing      What is the  patient wearing?: Underwear/pull up, Pants     Lower body assist Assist for lower body dressing: Maximal Assistance - Patient 25 - 49%     Toileting Toileting    Toileting assist       Transfers Chair/bed transfer  Transfers assist     Chair/bed transfer assist level: Minimal Assistance - Patient > 75% Chair/bed transfer  assistive device: Programmer, multimedia   Ambulation assist      Assist level: Contact Guard/Touching assist Assistive device: Walker-rolling Max distance: 10'   Walk 10 feet activity   Assist     Assist level: Contact Guard/Touching assist Assistive device: Walker-rolling   Walk 50 feet activity   Assist    Assist level: Contact Guard/Touching assist Assistive device: Walker-rolling    Walk 150 feet activity   Assist Walk 150 feet activity did not occur: Safety/medical concerns(decreased strength/activity tolerance)         Walk 10 feet on uneven surface  activity   Assist Walk 10 feet on uneven surfaces activity did not occur: Safety/medical concerns(decreased strength/activity tolerance)         Wheelchair     Assist   Type of Wheelchair: Manual    Wheelchair assist level: Supervision/Verbal cueing Max wheelchair distance: 150'    Wheelchair 50 feet with 2 turns activity    Assist        Assist Level: Supervision/Verbal cueing   Wheelchair 150 feet activity     Assist      Assist Level: Supervision/Verbal cueing   Blood pressure (!) 160/59, pulse 66, temperature 98.3 F (36.8 C), temperature source Oral, resp. rate 16, height 5\' 11"  (1.803 m), weight 80.1 kg, SpO2 95 %.  Medical Problem List and Plan: 1.Functional and mobility deficitssecondary to left FNF and subsequent left hip hemi -.admit to inpatient rehab 2. Antithrombotics: -DVT/anticoagulation:Pharmaceutical:Other (comment)--Eliquis -antiplatelet therapy: N/A 3. Pain Management:On toradol bid with prn hydrocodone.  10/16- changed to tramadol and tylenol prn-  4. Mood:LCSW to follow for evaluation and support. -antipsychotic agents: N/A 5. Neuropsych: This patientiscapable of making decisions on hisown behalf. 6. Skin/Wound Care:routine pressure relief measures. Monitor wound daily. Protein  supplements to help promote wound healing. 7. Fluids/Electrolytes/Nutrition:Strict I/O. Check lytes in am.  8. PAF: Monitor HR tid--on Eliquis and toprol for rate control.  9. Chronic diastolic JKD:TOIZTI to have overload. Will order CXR. Monitorweight daily. On Toprol, Minoxidil and Lipitor. No longer taking lasix apparently -consider brief course of lasix 10. HTN: Has been labile but improving. Will monitor BP tid. Continue Norvasc, Clonidine,Toprol and Minoxidil.   10/16- BP 160/59 this AM- if stays up, will need to increase. 11. T2DM with neuropathy: Monitor BS ac/hs.  12. Acute renal failure: Improvingafter IVF for hydration. Currentlyon Kerotolac--need to monitor renal status closely  10/16- hasn't taken Kerotolac since rehab admission; Cr 1.05 and BUN 21- down from 24- will monitor.  13. ABLA: Continue iron supplement. Monitor for any signs of bleeding. Recheck H/H in am.  10/16- Hb 8.6- slightly down from 8.8- will monitor 14. Fevers with leucocytosis:Has defervesced--monitor for signs of infection.Await UCSand results of CXR. ?LLL pneumonia -IS, OOB  10/16- WBC down to 8k- U/A (-); will monitor 15. Constipation:Increase miralax to bid.  10/16- no BM in 5 days- will order Mg citrate and enema vs suppository after Mg citrate to clear out.     LOS: 1 days A FACE TO FACE EVALUATION WAS PERFORMED  [name] 10/28/2018, 1:49 PM

## 2018-10-28 NOTE — Progress Notes (Signed)
Occupational Therapy Session Note  Patient Details  Name: [name] MRN: [id number] Date of Birth: Jul 28, 1929  Today's Date: 10/28/2018 OT Individual Time: 1300-1400 OT Individual Time Calculation (min): 60 min    Short Term Goals: Week 1:  OT Short Term Goal 1 (Week 1): STG = LTGs due to ELOS  Skilled Therapeutic Interventions/Progress Updates:  OT intervention with focus on AE introduction, simulated shower stall transfer, toilet transfer, toileting, review of POC, activity tolerance, and safety awareness.  Demonstrated sock aid, reacher, and long shoe horn.  Pt practiced shower transfers with CGA.  Pt amb with RW into bathroom with CGA and required mod A for toleting tasks. Pt remained in w/c with seat alarm activated and all needs within reach.   Therapy Documentation Precautions:  Precautions Precautions: Fall, Posterior Hip Precaution Booklet Issued: Yes (comment) Precaution Comments: Reviewed posterior hip precautions with pt throughout Restrictions Weight Bearing Restrictions: Yes LLE Weight Bearing: Weight bearing as tolerated Pain: Pain Assessment Pain Scale: 0-10 Pain Score: 7  Pain Type: Surgical pain Pain Location: Hip Pain Orientation: Left Pain Descriptors / Indicators: Aching Pain Frequency: Intermittent Pain Onset: With Activity Patients Stated Pain Goal: 0 Pain Intervention(s):;Repositioned Multiple Pain Sites: No  Therapy/Group: Individual Therapy  Leroy Libman 10/28/2018, 2:41 PM

## 2018-10-28 NOTE — Care Management Note (Signed)
Inpatient [facility] Individual Statement of Services  Patient Name:  [name]  Date:  10/28/2018  Welcome to the [facility].  Our goal is to provide you with an individualized program based on your diagnosis and situation, designed to meet your specific needs.  With this comprehensive rehabilitation program, you will be expected to participate in at least 3 hours of rehabilitation therapies Monday-Friday, with modified therapy programming on the weekends.  Your rehabilitation program will include the following services:  Physical Therapy (PT), Occupational Therapy (OT), 24 hour per day rehabilitation nursing, Case Management (Social Worker), Rehabilitation Medicine, Nutrition Services and Pharmacy Services  Weekly team conferences will be held on Wednesday to discuss your progress.  Your Social Worker will talk with you frequently to get your input and to update you on team discussions.  Team conferences with you and your family in attendance may also be held.  Expected length of stay: 7-10 days  Overall anticipated outcome: Supervision-mod/i level  Depending on your progress and recovery, your program may change. Your Social Worker will coordinate services and will keep you informed of any changes. Your Social Worker's name and contact numbers are listed  below.  The following services may also be recommended but are not provided by the [facility] will be made to provide these services after discharge if needed.  Arrangements include referral to agencies that provide these services.  Your insurance has been verified to be: [employer]  Your primary doctor is:  Willette Alma  Pertinent information will be shared with your doctor and your insurance company.  Social Worker:  Ovidio Kin, [place] or ([phone]  Information discussed with and copy given to patient by: Elease Hashimoto, 10/28/2018, 9:16 AM

## 2018-10-28 NOTE — Evaluation (Signed)
Physical Therapy Assessment and Plan  Patient Details  Name: [name] MRN: [id number] Date of Birth: 15-Aug-1929  PT Diagnosis: Abnormal posture, Abnormality of gait, Difficulty walking, Edema and Pain in joint Rehab Potential: Excellent ELOS: 7-10 days   Today's Date: 10/28/2018 PT Individual Time: 1105-1205 PT Individual Time Calculation (min): 60 min    Problem List:  Patient Active Problem List   Diagnosis Date Noted  . Femur fracture, left (HCC) 10/27/2018  . Hip fracture (HCC) 10/23/2018  . Hip fracture, left (HCC) 10/23/2018  . Ankle pain 01/13/2018  . Neck pain 01/13/2018  . Cellulitis of right lower extremity 01/13/2018  . Constipation 01/11/2018  . Low back pain 01/11/2018  . Dysuria 12/06/2016  . Weakness 12/06/2016  . 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 Preventative health care 12/06/2016  . Hyperglycemia 04/05/2016  . Epigastric pain 04/05/2016  . Spondylosis, cervical, with myelopathy 11/26/2015  . Cervicogenic headache 11/26/2015  . Umbilical hernia 11/06/2011  . Asthma 10/08/2011  . Carotid stenosis 03/31/2011  . Chronic pain   . Vitamin D deficiency 12/11/2008  . HLD (hyperlipidemia) 12/11/2008  . Anemia 12/11/2008  . Cerebral artery occlusion with cerebral infarction (HCC) 12/11/2008  . URINARY INCONTINENCE, MALE 12/11/2008  . Disorder of bursae and tendons in shoulder region 12/29/2007  . ANXIETY, MILD 08/09/2007  . HYPERTENSION, BENIGN ESSENTIAL 01/04/2007  . Allergic rhinitis 01/04/2007  . HEADACHE, TENSION 11/05/2006  . PROSTATE CANCER, HX OF 11/05/2006    Past Medical History:  Past Medical History:  Diagnosis Date  . Acute bronchitis 06/10/2015  . Allergic rhinitis   . Anemia   . Asthma   . Carotid stenosis    a. s/p Left CEA 2002;  b. Carotid US 3/16:  patent R CEA, L < 40%  . Chronic pain syndrome    Left shoulder, back  . Diverticulosis   . Dyslipidemia   . Epigastric pain 04/05/2016  . Esophageal stricture     Distal, benign  . GERD (gastroesophageal reflux disease)   . Hemorrhoids   . HTN (hypertension)    Negative renal duplex 07-22-11  . Hx of echocardiogram    a. Echo 10/11: mod LVH, EF 55-60%  . Pneumonia 07-2013  . Prostate cancer (HCC) 2000   Seed XRT  . Spondylosis, cervical, with myelopathy 11/26/2015  . 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 APPENDECTOMY  1953  . CAROTID ENDARTERECTOMY Right 01/2000  . CATARACT EXTRACTION Bilateral   . CERVICAL DISC SURGERY  8/07  . HIP ARTHROPLASTY Left 10/24/2018   Procedure: ARTHROPLASTY BIPOLAR HIP (HEMIARTHROPLASTY);  Surgeon: Durene Romans, MD;  Location: [facility] OR;  Service: Orthopedics;  Laterality: Left;  . INSERTION PROSTATE RADIATION SEED  2000  . LUMBAR DISC SURGERY  1990s  . NASAL SINUS SURGERY     x 3    Assessment & Plan Clinical Impression: Patient is a 85 y.o. year old male with history of HTN, GERD/esophageal stricture, prostate cancer, PAF, prior stroke who was admitted on 10/24/18 after mechanical fall onto his left side with inability to stand or bear weight on LLE. He was found to have displaced left femoral neck fracture and underwent left hip hemi by Dr. Charlann Boxer on the same day. Post op to be WBAT with posterior hip precautions and Eliquis resumed on 10/13. He has had issue with acute renal failure treated with IVF for hydration as  well as feverof 101.2 F10/14 am and treated with fluid bolus.CXR showed LLE atelectasis with small pleural effusion.UA Negative/UCS pending. Therapy ongoing and patient continues to have difficulty remembering hip precautions and pain affecting mobility and ADL tasks. CIR recommended due to functional decline. Patient transferred to CIR on 10/27/2018 .   Patient currently requires min with mobility secondary to muscle weakness and muscle joint tightness, decreased cardiorespiratoy endurance and  decreased sitting balance, decreased standing balance, decreased postural control, decreased balance strategies and difficulty maintaining precautions.  Prior to hospitalization, patient was independent  with mobility and lived with Spouse in a House home.  Home access is 4-5Stairs to enter.  Patient will benefit from skilled PT intervention to maximize safe functional mobility, minimize fall risk and decrease caregiver burden for planned discharge home with intermittent assist.  Anticipate patient will benefit from follow up OP at discharge.  PT - End of Session Activity Tolerance: Tolerates 30+ min activity with multiple rests Endurance Deficit: Yes Endurance Deficit Description: Requires sitting est breaks between activities PT Assessment Rehab Potential (ACUTE/IP ONLY): Excellent PT Barriers to Discharge: Decreased caregiver support;Home environment access/layout PT Barriers to Discharge Comments: Patient's wife can provide only supervision at home and typically the patient provides her assistance with basic ADLs, does have children available PRN; has 4 STE with B rails in back and 2 STE with R rail in front of home PT Patient demonstrates impairments in the following area(s): Balance;Edema;Endurance;Motor;Nutrition;Pain;Safety;Skin Integrity PT Transfers Functional Problem(s): Bed Mobility;Bed to Chair;Car;Furniture;Floor PT Locomotion Functional Problem(s): Ambulation;Wheelchair Mobility;Stairs PT Plan PT Intensity: Minimum of 1-2 x/day ,45 to 90 minutes PT Frequency: 5 out of 7 days PT Duration Estimated Length of Stay: 7-10 days PT Treatment/Interventions: Ambulation/gait training;Discharge planning;Functional mobility training;Psychosocial support;Therapeutic Activities;Visual/perceptual remediation/compensation;Balance/vestibular training;Disease management/prevention;Neuromuscular re-education;Skin care/wound management;Therapeutic Exercise;Wheelchair propulsion/positioning;DME/adaptive  equipment instruction;Pain management;Splinting/orthotics;UE/LE Strength taining/ROM;Community reintegration;Patient/family education;Stair training;UE/LE Coordination activities PT Transfers Anticipated Outcome(s): mod I using LRAD PT Locomotion Anticipated Outcome(s): Mod I 150' using LRAD PT Recommendation Recommendations for Other Services: Therapeutic Recreation consult Therapeutic Recreation Interventions: Stress management Follow Up Recommendations: Outpatient PT Patient destination: Home Equipment Recommended: To be determined Equipment Details: Patient has RW and SPC at home form previous CVA, will make sure they are in good shape to be used  Skilled Therapeutic Intervention In addition to the PT evaluation below, the patient performed the following skilled PT interventions:  Patient in w/c in room upon PT arrival. Patient alert and agreeable to PT session. Patient reported 4/10 L hip pain during session, RN made aware. PT provided repositioning, rest breaks, and distraction as pain interventions throughout session. Patient able to recal 1/3 posterior hip precautions at beginning of session. PT educated on hip precautions and WB precautions.   Therapeutic Activity: Bed Mobility: Patient performed rolling R/L and supine to/from sit with supervision and significantly increased time to complete mobility on a mat table. Provided verbal cues for mentioning hip precautions throughout mobility. Transfers: Patient performed sit to/from stand x4, stand pivot x2, and a car transfer x1 with min a-CGA using the RW. Provided verbal cues for L UE on the RW and R pushing up from arm rest or stable surface and L LE slightly ahead of R to off weight his L LE during transfers and increased L hip angle during standing/sitting >90 degrees to maintain hip precautions.  Gait Training:  Patient ambulated 87 feet and 17 feet using RW with CGA. Ambulated with step-to gait pattern leading with L and additional  gait deviations listed below. Provided verbal  cues for erect posture, sequencing, stepping inside RW for increased UE support and balance, and looking ahead.  Wheelchair Mobility:  Patient propelled wheelchair 150 feet with supervision using B UEs. Provided verbal cues for propelling with B UEs at the same time and with increased ROM to increase momentum.   Patient in w/c at end of session with breaks locked, chair alarm set, and all needs within reach.   Instructed pt in results of PT evaluation as detailed below, PT POC, rehab potential, rehab goals, and discharge recommendations. Additionally discussed CIR's policies regarding fall safety and use of chair alarm and/or quick release belt. Pt verbalized understanding and in agreement. Will update pt's family members as they become available.    PT Evaluation Precautions/Restrictions Precautions Precautions: Fall;Posterior Hip Precaution Comments: Reviewed posterior hip precautions with pt throughout Restrictions Weight Bearing Restrictions: Yes LLE Weight Bearing: Weight bearing as tolerated General   Vital SignsOxygen Therapy O2 Device: Room Air Pain Pain Assessment Pain Scale: 0-10 Pain Score: 7  Pain Type: Surgical pain Pain Location: Hip Pain Orientation: Left Pain Descriptors / Indicators: Aching Pain Frequency: Intermittent Pain Onset: With Activity Patients Stated Pain Goal: 0 Pain Intervention(s): Medication (See eMAR);Repositioned Multiple Pain Sites: No Home Living/Prior Functioning Home Living Living Arrangements: Spouse/significant other Available Help at Discharge: Family;Available 24 hours/day;Other (Comment)(adult children will assist) Type of Home: House Home Access: Stairs to enter Entrance Stairs-Number of Steps: 4-5 Entrance Stairs-Rails: Right;Left;Can reach both Home Layout: One level Bathroom Shower/Tub: Health visitor: Handicapped height  Lives With: Spouse Prior Function Level of  Independence: Independent with basic ADLs;Independent with homemaking with ambulation;Independent with gait  Able to Take Stairs?: Yes Driving: Yes Comments: enjoys working in the yard Vision/Perception  Vision - Assessment Tracking/Visual Pursuits: Decreased smoothness of vertical tracking;Decreased smoothness of horizontal tracking;Decreased smoothness of eye movement to LEFT superior field;Decreased smoothness of eye movement to LEFT inferior field;Decreased smoothness of eye movement to RIGHT superior field;Decreased smoothness of eye movement to RIGHT inferior field Saccades: Undershoots Perception Perception: Within Functional Limits Praxis Praxis: Intact  Cognition Overall Cognitive Status: Within Functional Limits for tasks assessed Arousal/Alertness: Awake/alert Attention: Alternating;Selective Selective Attention: Appears intact Memory: Appears intact Immediate Memory Recall: Sock;Blue;Bed Memory Recall Sock: Not able to recall Memory Recall Blue: Without Cue Memory Recall Bed: Without Cue Awareness: Appears intact Problem Solving: Appears intact Safety/Judgment: Appears intact Comments: does require cues to obtain to posterior hip precautions during mobility and self-care tasks Sensation Sensation Light Touch: Appears Intact Proprioception: Appears Intact Coordination Gross Motor Movements are Fluid and Coordinated: No Fine Motor Movements are Fluid and Coordinated: No(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) Coordination and Movement Description: decreased strength/ROM and increased pain in L hip secondary to fx Finger Nose Finger Test: mild shakiness with LUE (due to premorbid CVA in 2001) Motor  Motor Motor - Skilled Clinical Observations: decreased strength/ROM/activity tolerance and increased pain in L hip secondary to hip fx  Mobility Bed Mobility Bed Mobility: Rolling Right;Rolling Left;Supine to Sit;Sit to Supine Rolling Right: Supervision/verbal  cueing Rolling Left: Supervision/Verbal cueing Left Sidelying to Sit: Moderate Assistance - Patient 50-74% Supine to Sit: Supervision/Verbal cueing Sit to Supine: Supervision/Verbal cueing Transfers Transfers: Sit to Stand;Stand to Sit;Stand Pivot Transfers Sit to Stand: Minimal Assistance - Patient > 75% Stand to Sit: Minimal Assistance - Patient > 75% Stand Pivot Transfers: Minimal Assistance - Patient > 75% Stand Pivot Transfer Details: Verbal cues for sequencing;Verbal cues for safe use of DME/AE;Verbal cues for gait pattern;Verbal cues  for technique;Verbal cues for precautions/safety Transfer (Assistive device): Rolling walker Locomotion  Gait Ambulation: Yes Gait Assistance: Contact Guard/Touching assist Gait Distance (Feet): 87 Feet Assistive device: Rolling walker Gait Gait: Yes Gait Pattern: Step-to pattern;Decreased step length - right;Decreased stance time - left;Decreased stride length;Decreased hip/knee flexion - left;Decreased dorsiflexion - left;Decreased weight shift to left;Left foot flat;Right foot flat;Antalgic;Trunk flexed;Narrow base of support;Decreased trunk rotation Gait velocity: decreased Stairs / Additional Locomotion Stairs: Yes Stairs Assistance: Minimal Assistance - Patient > 75% Stair Management Technique: Two rails Number of Stairs: 4 Height of Stairs: 6 Wheelchair Mobility Wheelchair Mobility: Yes Wheelchair Assistance: Doctor, general practice: Both upper extremities Wheelchair Parts Management: Needs assistance Distance: 150'  Trunk/Postural Assessment  Cervical Assessment Cervical Assessment: Exceptions to WFL(forward head) Thoracic Assessment Thoracic Assessment: Exceptions to WFL(kyphosis) Lumbar Assessment Lumbar Assessment: Exceptions to WFL(Posterior pelvic tilt) Postural Control Postural Control: Deficits on evaluation(decreased/delayed)  Balance Balance Balance Assessed: Yes Static Sitting Balance Static  Sitting - Balance Support: No upper extremity supported;Feet supported Static Sitting - Level of Assistance: 5: Stand by assistance Dynamic Sitting Balance Dynamic Sitting - Balance Support: Right upper extremity supported;Left upper extremity supported;During functional activity;Feet supported Dynamic Sitting - Level of Assistance: 5: Stand by assistance Dynamic Sitting - Balance Activities: Lateral lean/weight shifting;Forward lean/weight shifting;Reaching for Higher education careers adviser Standing - Balance Support: Bilateral upper extremity supported;During functional activity Static Standing - Level of Assistance: 4: Min assist Dynamic Standing Balance Dynamic Standing - Balance Support: Left upper extremity supported;Right upper extremity supported Dynamic Standing - Level of Assistance: 4: Min assist Dynamic Standing - Balance Activities: Lateral lean/weight shifting;Forward lean/weight shifting;Reaching for objects Extremity Assessment  RUE Assessment RUE Assessment: Within Functional Limits General Strength Comments: grossly 4/5 LUE Assessment LUE Assessment: Within Functional Limits General Strength Comments: grossly 4/5 RLE Assessment RLE Assessment: Within Functional Limits Active Range of Motion (AROM) Comments: WFL for all functional mobility General Strength Comments: Grossly in sitting 5/5 thoughout except hip flexion 4/5 LLE Assessment LLE Assessment: Exceptions to [facility] Active Range of Motion (AROM) Comments: Hip flexion at least to 90 degrees in supine, limited due to hip precautions, and hip extension to neutral in supine; DF limited to neutral and tight hamstrings in sitting General Strength Comments: Grossly at least 3+/5 throughout, limited by L hip pain, decreased active DF with gait (reports he has had this since his stroke)    Refer to Care Plan for Long Term Goals  Recommendations for other services: Therapeutic Recreation  Stress  management  Discharge Criteria: Patient will be discharged from PT if patient refuses treatment 3 consecutive times without medical reason, if treatment goals not met, if there is a change in medical status, if patient makes no progress towards goals or if patient is discharged from hospital.  The above assessment, treatment plan, treatment alternatives and goals were discussed and mutually agreed upon: by patient  Sareen Randon L Terika Pillard PT, DPT  10/28/2018, 1:00 PM

## 2018-10-29 ENCOUNTER — Inpatient Hospital Stay (HOSPITAL_COMMUNITY): Payer: PPO

## 2018-10-29 ENCOUNTER — Inpatient Hospital Stay (HOSPITAL_COMMUNITY): Payer: PPO | Admitting: Occupational Therapy

## 2018-10-29 DIAGNOSIS — S72002S Fracture of unspecified part of neck of left femur, sequela: Secondary | ICD-10-CM | POA: Insufficient documentation

## 2018-10-29 DIAGNOSIS — E1142 Type 2 diabetes mellitus with diabetic polyneuropathy: Secondary | ICD-10-CM

## 2018-10-29 DIAGNOSIS — D62 Acute posthemorrhagic anemia: Secondary | ICD-10-CM

## 2018-10-29 DIAGNOSIS — I5032 Chronic diastolic (congestive) heart failure: Secondary | ICD-10-CM

## 2018-10-29 DIAGNOSIS — E875 Hyperkalemia: Secondary | ICD-10-CM

## 2018-10-29 DIAGNOSIS — S72092S Other fracture of head and neck of left femur, sequela: Secondary | ICD-10-CM

## 2018-10-29 DIAGNOSIS — N179 Acute kidney failure, unspecified: Secondary | ICD-10-CM

## 2018-10-29 DIAGNOSIS — I10 Essential (primary) hypertension: Secondary | ICD-10-CM

## 2018-10-29 LAB — GLUCOSE, CAPILLARY
Glucose-Capillary: 118 mg/dL — ABNORMAL HIGH (ref 70–99)
Glucose-Capillary: 126 mg/dL — ABNORMAL HIGH (ref 70–99)
Glucose-Capillary: 156 mg/dL — ABNORMAL HIGH (ref 70–99)

## 2018-10-29 MED ORDER — AMLODIPINE BESYLATE 10 MG PO TABS
10.0000 mg | ORAL_TABLET | Freq: Every day | ORAL | Status: DC
  Administered 2018-10-30 – 2018-11-05 (×7): 10 mg via ORAL
  Filled 2018-10-29 (×7): qty 1

## 2018-10-29 MED ORDER — AMLODIPINE BESYLATE 5 MG PO TABS
5.0000 mg | ORAL_TABLET | Freq: Once | ORAL | Status: AC
  Administered 2018-10-29: 5 mg via ORAL
  Filled 2018-10-29: qty 1

## 2018-10-29 MED ORDER — COLLAGENASE 250 UNIT/GM EX OINT
TOPICAL_OINTMENT | Freq: Every day | CUTANEOUS | Status: DC
  Administered 2018-10-29 – 2018-10-30 (×2): via TOPICAL
  Filled 2018-10-29: qty 30

## 2018-10-29 NOTE — IPOC Note (Signed)
Individualized overall Plan of Care (IPOC) Patient Details Name: [name] MRN: [id number] DOB: 02-Jun-1929  Admitting Diagnosis: Closed left hip fracture, sequela  Hospital Problems: Principal Problem:   Closed left hip fracture, sequela Active Problems:   HYPERTENSION, BENIGN ESSENTIAL   URINARY INCONTINENCE, MALE   Weakness   Constipation   Femur fracture, left (HCC)   History of left hip hemiarthroplasty   Wheezing on expiration   Hyperkalemia   Acute blood loss anemia   AKI (acute kidney injury) ([place])   Diabetic peripheral neuropathy (HCC)   Essential hypertension   Chronic diastolic congestive heart failure ([place])     Functional Problem List: Nursing Bowel, Edema, Nutrition, Skin Integrity  PT Balance, Edema, Endurance, Motor, Nutrition, Pain, Safety, Skin Integrity  OT Balance, Endurance, Motor, Pain, Safety  SLP    TR         Basic ADL's: OT Grooming, Bathing, Dressing, Toileting     Advanced  ADL's: OT       Transfers: PT Bed Mobility, Bed to Chair, Car, Furniture, Floor  OT Toilet, Metallurgist: PT Ambulation, Emergency planning/management officer, Stairs     Additional Impairments: OT None  SLP        TR      Anticipated Outcomes Item Anticipated Outcome  Self Feeding    Swallowing      Basic self-care  Mod I  Toileting  Mod I   Bathroom Transfers Mod I  Bowel/Bladder  remain continent and maintain regular pattern of emptying bowel and bladder  Transfers  mod I using LRAD  Locomotion  Mod I 150' using LRAD  Communication     Cognition     Pain  less than 4  Safety/Judgment  no falls, skin breakdown or infection   Therapy Plan: PT Intensity: Minimum of 1-2 x/day ,45 to 90 minutes PT Frequency: 5 out of 7 days PT Duration Estimated Length of Stay: 7-10 days OT Intensity: Minimum of 1-2 x/day, 45 to 90 minutes OT Frequency: 5 out of 7 days OT Duration/Estimated Length of Stay: 7-10 days      Team Interventions: Nursing  Interventions Patient/Family Education, Pain Management, Bowel Management, Skin Care/Wound Management, Discharge Planning  PT interventions Ambulation/gait training, Discharge planning, Functional mobility training, Psychosocial support, Therapeutic Activities, Visual/perceptual remediation/compensation, Balance/vestibular training, Disease management/prevention, Neuromuscular re-education, Skin care/wound management, Therapeutic Exercise, Wheelchair propulsion/positioning, DME/adaptive equipment instruction, Pain management, Splinting/orthotics, UE/LE Strength taining/ROM, Academic librarian, Barrister's clerk education, IT trainer, UE/LE Coordination activities  OT Interventions Training and development officer, Academic librarian, Discharge planning, Disease mangement/prevention, Engineer, drilling, Functional mobility training, Pain management, Patient/family education, Psychosocial support, Self Care/advanced ADL retraining, Skin care/wound managment, Therapeutic Activities, Therapeutic Exercise, UE/LE Strength taining/ROM  SLP Interventions    TR Interventions    SW/CM Interventions Discharge Planning, Psychosocial Support, Patient/Family Education   Barriers to Discharge MD  Medical stability  Nursing      PT Decreased caregiver support, Home environment access/layout Patient's wife can provide only supervision at home and typically the patient provides her assistance with basic ADLs, does have children available PRN; has 4 STE with B rails in back and 2 STE with R rail in front of home  OT      SLP      SW       Team Discharge Planning: Destination: PT-Home ,OT- Home , SLP-  Projected Follow-up: PT-Outpatient PT, OT-  Home health OT, SLP-  Projected Equipment Needs: PT-To be determined, OT- None recommended by OT, SLP-  Equipment Details: PT-Patient has RW and SPC at home form previous CVA, will make sure they are in good shape to be used, OT-  Patient/family  involved in discharge planning: PT- Patient,  OT-Patient, SLP-   MD ELOS: 7-12 days. Medical Rehab Prognosis:  Good Assessment: 85 year old male with history of HTN, GERD/esophageal stricture, prostate cancer, PAF, prior stroke who was admitted on 10/24/18 after mechanical fall onto his left side with inability to stand or bear weight on LLE. He was found to have displaced left femoral neck fracture and underwent left hip hemi by Dr. Alvan Dame on the same day. Post op to be WBAT with posterior hip precautions and Eliquis resumed on 10/13. He has had issue with acute renal failure treated with IVF for hydration as well as feverof 101.2 F10/14 am and treated with fluid bolus.CXR showed LLE atelectasis with small pleural effusion.Therapy ongoing and patient continues to have difficulty remembering hip precautions and pain affecting mobility and ADL tasks.  We will set goals for mod I with PT/OT.  Due to the current state of emergency, patients may not be receiving their 3-hours of Medicare-mandated therapy.  See Team Conference Notes for weekly updates to the plan of care

## 2018-10-29 NOTE — Progress Notes (Addendum)
[facility] PHYSICAL MEDICINE & REHABILITATION PROGRESS NOTE   Subjective/Complaints: Patient seen sitting up in his chair this morning, working with therapies.  States he slept well overnight.  He states he had a good, but tiring first day of therapies yesterday.  Discussed wheezing with therapies.  ROS: Denies CP, SOB, N/V/D  Objective:   Dg Chest 2 View  Result Date: 10/27/2018 CLINICAL DATA:  Wheezing on expiration EXAM: CHEST - 2 VIEW COMPARISON:  Chest radiograph from one day prior. FINDINGS: Stable cardiomediastinal silhouette with top-normal heart size. No pneumothorax. No pleural effusion. No overt pulmonary edema. Hazy opacity at both lung bases. Hyperinflated lungs. IMPRESSION: 1. Hyperinflated lungs, suggesting obstructive lung disease. 2. Hazy opacities at both lung bases, favor atelectasis, difficult to exclude a component of aspiration or pneumonia. Chest radiograph follow-up suggested. Electronically Signed   By: Ilona Sorrel M.D.   On: 10/27/2018 16:57   Recent Labs    10/27/18 1039 10/28/18 0534  WBC 10.1 8.1  HGB 8.8* 8.6*  HCT 26.0* 24.8*  PLT 228 228   Recent Labs    10/28/18 0534  NA 139  K 5.1  CL 109  CO2 25  GLUCOSE 120*  BUN 21  CREATININE 1.05  CALCIUM 8.0*    Intake/Output Summary (Last 24 hours) at 10/29/2018 1008 Last data filed at 10/28/2018 1700 Gross per 24 hour  Intake 480 ml  Output -  Net 480 ml     Physical Exam: Vital Signs Blood pressure (!) 155/89, pulse 92, temperature 98.6 F (37 C), temperature source Oral, resp. rate 16, height 5\' 11"  (1.803 m), weight 80.1 kg, SpO2 98 %. Constitutional: No distress . Vital signs reviewed. HENT: Normocephalic.  Atraumatic. Eyes: EOMI. No discharge. Cardiovascular: No JVD. Respiratory: Normal effort.  No stridor.  + Expiratory wheezes. GI: Non-distended. Skin: Left hip dressing C/D/I Psych: Normal mood.  Normal behavior. Musc: Left hip with edema and tenderness Neurological:  Alert HOH Motor: Bilateral upper extremities: UE 4/5.  LLE: 2/5 HF, 3/5 KE, 4/5 ADF/PF. RLE 4-/5 HF, KE and 5/5 ADF/PF.    Assessment/Plan: 1. Functional deficits secondary to L hemiarthroplasty of L hip due to L femoral neck fracture  which require 3+ hours per day of interdisciplinary therapy in a comprehensive inpatient rehab setting.  Physiatrist is providing close team supervision and 24 hour management of active medical problems listed below.  Physiatrist and rehab team continue to assess barriers to discharge/monitor patient progress toward functional and medical goals  Care Tool:  Bathing    Body parts bathed by patient: Right arm, Left arm, Chest, Abdomen, Front perineal area, Buttocks, Face   Body parts bathed by helper: Left lower leg, Right lower leg, Right upper leg, Left upper leg     Bathing assist Assist Level: Moderate Assistance - Patient 50 - 74%     Upper Body Dressing/Undressing Upper body dressing   What is the patient wearing?: (P) Pull over shirt    Upper body assist Assist Level: (P) Set up assist    Lower Body Dressing/Undressing Lower body dressing      What is the patient wearing?: Underwear/pull up, Pants     Lower body assist Assist for lower body dressing: Maximal Assistance - Patient 25 - 49%     Toileting Toileting    Toileting assist Assist for toileting: Moderate Assistance - Patient 50 - 74%     Transfers Chair/bed transfer  Transfers assist     Chair/bed transfer assist level: Moderate Assistance -  Patient 50 - 74% Chair/bed transfer assistive device: Museum/gallery exhibitions officer assist      Assist level: Contact Guard/Touching assist Assistive device: Walker-rolling Max distance: 40'   Walk 10 feet activity   Assist     Assist level: Contact Guard/Touching assist Assistive device: Walker-rolling   Walk 50 feet activity   Assist    Assist level: Contact Guard/Touching  assist Assistive device: Walker-rolling    Walk 150 feet activity   Assist Walk 150 feet activity did not occur: Safety/medical concerns(decreased strength/activity tolerance)         Walk 10 feet on uneven surface  activity   Assist Walk 10 feet on uneven surfaces activity did not occur: Safety/medical concerns(decreased strength/activity tolerance)         Wheelchair     Assist   Type of Wheelchair: Manual    Wheelchair assist level: Supervision/Verbal cueing Max wheelchair distance: 150'    Wheelchair 50 feet with 2 turns activity    Assist        Assist Level: Supervision/Verbal cueing   Wheelchair 150 feet activity     Assist      Assist Level: Supervision/Verbal cueing   Blood pressure (!) 155/89, pulse 92, temperature 98.6 F (37 C), temperature source Oral, resp. rate 16, height 5\' 11"  (1.803 m), weight 80.1 kg, SpO2 98 %.  Medical Problem List and Plan: 1.Functional and mobility deficitssecondary to left FNF and subsequent left hip hemi  Continue CIR 2. Antithrombotics: -DVT/anticoagulation:Pharmaceutical:Other (comment)--Eliquis -antiplatelet therapy: N/A 3. Pain Management:  10/16- changed to tramadol and tylenol prn-   Relatively controlled on 10/17 4. Mood:LCSW to follow for evaluation and support. -antipsychotic agents: N/A 5. Neuropsych: This patientiscapable of making decisions on hisown behalf. 6. Skin/Wound Care:routine pressure relief measures. Monitor wound daily. Protein supplements to help promote wound healing. 7. Fluids/Electrolytes/Nutrition:Strict I/O.  8. PAF: Monitor HR tid--on Eliquis and toprol for rate control.  9. Chronic diastolic CHF:On Toprol, Minoxidil and Lipitor. No longer taking lasix apparently -consider brief course of lasix  Daily weights ordered Filed Weights   10/27/18 1546  Weight: 80.1 kg   10. HTN: Will monitor BP tid.   Norvasc  increased to 10 on 10/17  Continue Clonidine,Toprol and Minoxidil.  11. T2DM with neuropathy: Monitor BS ac/hs.   CBGs ordered 12. Acute renal injury:   Creatinine 1.05 on 10/16, labs ordered for Monday 13. ABLA: Continue iron supplement. Monitor for any signs of bleeding. Recheck H/H in am.  Hemoglobin 8.6 on 10/16, continue to monitor 14. Fevers with leucocytosis:Resolved -IS, OOB 15. Constipation:Increase miralax to bid.  Continue to adjust meds as necessary 16.  Hyperkalemia  Potassium 5.1 on 10/16  Labs ordered for Monday 17.  Wheezing  Continue inhaler as needed     LOS: 2 days A FACE TO FACE EVALUATION WAS PERFORMED  [name] Lorie Phenix 10/29/2018, 10:08 AM

## 2018-10-29 NOTE — Progress Notes (Signed)
Endoderm is not available to be placed on patient right ankle wound; Wound care associate Lysle Morales RN  Consulted and recommended calling MD for wound consult. Dr. Posey Pronto ordered santyl to right ankle wound then wet to dry dressing.Orders noted.

## 2018-10-29 NOTE — Progress Notes (Signed)
Physical Therapy Session Note  Patient Details  Name: [name] MRN: [id number] Date of Birth: 10-13-1929  Today's Date: 10/29/2018 PT Individual Time:0800-0900, 1400-1500 PT Individual Time Calculation (min): 60 min , 60 min  Short Term Goals: Week 1:  PT Short Term Goal 1 (Week 1): STG=LTG due to short ELOS.  Skilled Therapeutic Interventions/Progress Updates:   tx 1:  Pt asleep in bed, but easily awakened.  He rated pain 7/10 L hip; requested pain meds during tx. He was able to state 2/3 hip precautions.  PT reviewed these with him, including functional examples.  Rolling R with supervision.  R side lying> sit with min assist and bed features.  As pt rolled and sat up, wheezing noted.  PT informed Dr. Posey Pronto when he stopped in to see pt.  Sit> stand from raised bed and transfer to w/c with RW, CGA.  Pt set up for breakfast in sitting in w/c, LUE elevated on ELR.  Gait training with RW on level tile x 80' including turns, mod cues for sequencing, CGA.  At end of session, pt seated in w/c with needs at hand and seat belt alarm set.  PT consulted with Marjorie Smolder, RN regarding pt's pain and wheezing.  tx 2:  Pt asleep but easily awakened.  He rated pain 3/10 L hip; premedicated.    Therapeutic exercise performed with LEs to increase strength for functional mobility; active assistive PRN for LLE: 10 x 1 bil bridging, R/L straight leg raises, R/L hip abduction, R/L heel slides; 2 x 10 cervical flexion, bil short arc quad knee extension, alternating R/Lankle pumps.  Seated : 10 x 1 R/L long arc quad knee extensions.  In flat bed, no rails, rolling R with supervision; VCs and TCs for sitting up.  PT donned R shoe.  Stand pivot to w/c iwht RW, CGA.  Gait training with RW through obstacle course of 6 cones, requiring weaving trajectory, CGA.   At end of session, pt resting in w/c with LLE elevated on ELR, needs at hand and seat belt alarm set.        Therapy  Documentation Precautions:  Precautions Precautions: Fall, Posterior Hip Precaution Booklet Issued: Yes (comment) Precaution Comments: Reviewed posterior hip precautions with pt throughout Restrictions Weight Bearing Restrictions: Yes LLE Weight Bearing: Weight bearing as tolerated      Therapy/Group: Individual Therapy  Jacqui Headen 10/29/2018, 4:21 PM

## 2018-10-29 NOTE — Progress Notes (Signed)
Occupational Therapy Session Note  Patient Details  Name: [name] MRN: [id number] Date of Birth: 1929/09/20  Today's Date: 10/29/2018 OT Individual Time: 0814-4818 OT Individual Time Calculation (min): 75 min    Short Term Goals: Week 1:  OT Short Term Goal 1 (Week 1): STG = LTGs due to ELOS  Skilled Therapeutic Interventions/Progress Updates: Patient dressed for the day and stated he'd bathed earlier.  patient educated and reviewed on Hip precautions.     He incorporated except when distracted during turning IF talking or distracted by others' words.  Otherwise overall he particpated as follows: Supine to edge of bed = close S  Toilet transfers via RW and elevated toilet seat= S and extra time to complete functional mobility due to statements that he was trying to not cause his pain to return.   toileting= he asked to sit for a bit of time to complete 'business" = close S as he was able to wipe and adhere to hip precautions.  Washed periarea with setup.  Many practices for sit to stand in prep for self care and functional transfers = extra time and close S Dynamic standing balance in prep for safe self care and transfers= close S, utilizing one hand at a time for approximately 5 minutes and maintaining balance utilizing both hands for 3 seconds.  Shower transfers= close S and cues for step over method into and out of 'threshold' (simulated items set up height of patient shower threshold)  Patient cmpleted exercises and endurance activities at end of session to increase safety for self care, transfers and functional mobility  Patient left seated edge of bed with alarm in place at end of session in prep for lunch   Therapy Documentation Precautions:  Precautions Precautions: Fall, Posterior Hip Precaution Booklet Issued: Yes (comment) Precaution Comments: Reviewed posterior hip precautions with pt throughout Restrictions Weight Bearing Restrictions: Yes LLE Weight  Bearing: Weight bearing as tolerated  Pain:6/10 lower back and legs intermittent.   RN gave pain meds and he stated pain resolved within a few minutes   Therapy/Group: Individual Therapy  Alfredia Ferguson [facility] 10/29/2018, 4:28 PM

## 2018-10-30 ENCOUNTER — Inpatient Hospital Stay (HOSPITAL_COMMUNITY): Payer: PPO

## 2018-10-30 DIAGNOSIS — G8918 Other acute postprocedural pain: Secondary | ICD-10-CM | POA: Insufficient documentation

## 2018-10-30 LAB — GLUCOSE, CAPILLARY
Glucose-Capillary: 107 mg/dL — ABNORMAL HIGH (ref 70–99)
Glucose-Capillary: 104 mg/dL — ABNORMAL HIGH (ref 70–99)
Glucose-Capillary: 111 mg/dL — ABNORMAL HIGH (ref 70–99)
Glucose-Capillary: 143 mg/dL — ABNORMAL HIGH (ref 70–99)

## 2018-10-30 NOTE — Progress Notes (Signed)
Patient refuses prevalon boots during HS. States boots are too heavy for him to move in bed. Patient upset due to lost heel protecting boot from home. Educated patient on prevalon boot, and pressure ulcers. Patient agreed to wear boot from home on right foot.

## 2018-10-30 NOTE — Progress Notes (Signed)
[facility] PHYSICAL MEDICINE & REHABILITATION PROGRESS NOTE   Subjective/Complaints: Patient seen laying in bed this morning.  He states he slept well overnight.  Notes improvement in wheezing.  ROS: Denies CP, SOB, N/V/D  Objective:   No results found. Recent Labs    10/27/18 1039 10/28/18 0534  WBC 10.1 8.1  HGB 8.8* 8.6*  HCT 26.0* 24.8*  PLT 228 228   Recent Labs    10/28/18 0534  NA 139  K 5.1  CL 109  CO2 25  GLUCOSE 120*  BUN 21  CREATININE 1.05  CALCIUM 8.0*    Intake/Output Summary (Last 24 hours) at 10/30/2018 0853 Last data filed at 10/30/2018 0804 Gross per 24 hour  Intake 836 ml  Output -  Net 836 ml     Physical Exam: Vital Signs Blood pressure (!) 166/60, pulse 66, temperature 98.7 F (37.1 C), temperature source Oral, resp. rate 16, height 5\' 11"  (1.803 m), weight 77.6 kg, SpO2 95 %. Constitutional: No distress . Vital signs reviewed. HENT: Normocephalic.  Atraumatic. Eyes: EOMI. No discharge. Cardiovascular: No JVD. Respiratory: Normal effort.  No stridor. GI: Non-distended. Skin: Left hip dressing C/D/I Psych: Normal mood.  Normal behavior. Musc: Left hip with edema and tenderness Neurological: Alert HOH Motor: Bilateral upper extremities: UE 4-4+/5.  LLE: 3 -/5 HF, 3+/5 KE, 4/5 ADF/PF. RLE 4-/5 HF, KE and 5/5 ADF/PF.    Assessment/Plan: 1. Functional deficits secondary to L hemiarthroplasty of L hip due to L femoral neck fracture  which require 3+ hours per day of interdisciplinary therapy in a comprehensive inpatient rehab setting.  Physiatrist is providing close team supervision and 24 hour management of active medical problems listed below.  Physiatrist and rehab team continue to assess barriers to discharge/monitor patient progress toward functional and medical goals  Care Tool:  Bathing    Body parts bathed by patient: Front perineal area, Buttocks   Body parts bathed by helper: Left lower leg, Right lower leg, Right  upper leg, Left upper leg     Bathing assist Assist Level: Moderate Assistance - Patient 50 - 74%     Upper Body Dressing/Undressing Upper body dressing   What is the patient wearing?: Pull over shirt    Upper body assist Assist Level: Set up assist    Lower Body Dressing/Undressing Lower body dressing      What is the patient wearing?: Underwear/pull up, Pants     Lower body assist Assist for lower body dressing: Maximal Assistance - Patient 25 - 49%     Toileting Toileting    Toileting assist Assist for toileting: Moderate Assistance - Patient 50 - 74%     Transfers Chair/bed transfer  Transfers assist     Chair/bed transfer assist level: Moderate Assistance - Patient 50 - 74% Chair/bed transfer assistive device: Museum/gallery exhibitions officer assist      Assist level: Contact Guard/Touching assist Assistive device: Walker-rolling Max distance: 76'   Walk 10 feet activity   Assist     Assist level: Contact Guard/Touching assist Assistive device: Walker-rolling   Walk 50 feet activity   Assist    Assist level: Contact Guard/Touching assist Assistive device: Walker-rolling    Walk 150 feet activity   Assist Walk 150 feet activity did not occur: Safety/medical concerns(decreased strength/activity tolerance)         Walk 10 feet on uneven surface  activity   Assist Walk 10 feet on uneven surfaces activity did not occur: Safety/medical  concerns(decreased strength/activity tolerance)         Wheelchair     Assist   Type of Wheelchair: Manual    Wheelchair assist level: Supervision/Verbal cueing Max wheelchair distance: 150'    Wheelchair 50 feet with 2 turns activity    Assist        Assist Level: Supervision/Verbal cueing   Wheelchair 150 feet activity     Assist      Assist Level: Supervision/Verbal cueing   Blood pressure (!) 166/60, pulse 66, temperature 98.7 F (37.1 C),  temperature source Oral, resp. rate 16, height 5\' 11"  (1.803 m), weight 77.6 kg, SpO2 95 %.  Medical Problem List and Plan: 1.Functional and mobility deficitssecondary to left FNF and subsequent left hip hemi  Continue CIR 2. Antithrombotics: -DVT/anticoagulation:Pharmaceutical:Other (comment)--Eliquis -antiplatelet therapy: N/A 3. Pain Management:  10/16- changed to tramadol and tylenol prn-   Relatively controlled on 10/18 4. Mood:LCSW to follow for evaluation and support. -antipsychotic agents: N/A 5. Neuropsych: This patientiscapable of making decisions on hisown behalf. 6. Skin/Wound Care:routine pressure relief measures. Monitor wound daily. Protein supplements to help promote wound healing. 7. Fluids/Electrolytes/Nutrition:Strict I/O.  8. PAF: Monitor HR tid--on Eliquis and toprol for rate control.  9. Chronic diastolic CHF:On Toprol, Minoxidil and Lipitor. No longer taking lasix apparently -consider brief course of lasix  Daily weights ordered [facility] Weights   10/27/18 1546 10/30/18 0444  Weight: 80.1 kg 77.6 kg   Appears stable on 10/18 10. HTN: Will monitor BP tid.   Norvasc increased to 10 on 10/17  Continue Clonidine,Toprol and Minoxidil.   Remains elevated on 10/18, we will not make further changes today 11. T2DM with neuropathy: Monitor BS ac/hs.   CBGs ordered  Labile on 10/18, consider medications if persistently the elevated 12. Acute renal injury:   Creatinine 1.05 on 10/16, labs ordered for tomorrow 13. ABLA: Continue iron supplement. Monitor for any signs of bleeding.   Hemoglobin 8.6 on 10/16, continue to monitor, labs ordered for tomorrow 14. Fevers with leucocytosis:Resolved -IS, OOB 15. Constipation:Increase miralax to bid.  Continue to adjust meds as necessary 16.  Hyperkalemia  Potassium 5.1 on 10/16  Labs ordered for tomorrow 17.  Wheezing  Continue inhaler as needed  Improved    LOS: 3 days A FACE TO FACE EVALUATION WAS PERFORMED  [name] Lorie Phenix 10/30/2018, 8:53 AM

## 2018-10-30 NOTE — Progress Notes (Addendum)
Wife wants to use the endoform from home claims this is the one they use at the wound center for patient's right ankle wound  not the santyl that was ordered yesterday. Dr. Posey Pronto it is ok to use patient's supply. Wife notified.

## 2018-10-30 NOTE — Progress Notes (Signed)
Physical Therapy Session Note  Patient Details  Name: [name] MRN: [id number] Date of Birth: 05-04-1929  Today's Date: 10/30/2018 PT Individual Time: 8546-2703 PT Individual Time Calculation (min): 30 min   Short Term Goals: Week 1:  PT Short Term Goal 1 (Week 1): STG=LTG due to short ELOS.  Skilled Therapeutic Interventions/Progress Updates:     Patient in bed grimacing upon PT arrival. Patient reported 10/10 L hip pain and requesting pain medicine, RN made aware. PT provided repositioning with a pillow between his legs and under his L LE due to L hip in internal rotation in the bed. Patient stated he would not be able to participate in therapy until he had some pain medicine. PT educated on pain management and calling for pain medicine when pain escalates and prior to therapy session. Patient was appreciative of education.   Returned after RN provided pain medicine for patient. Patient reported 8/10 L hip pain at beginning of session. Patient stated repositioning helped. PT provided repositioning, rest breaks, and distraction as pain interventions throughout session. Patient initially stated he did not want to do any therapy due to pain, but asked if he could go to the bathroom. Focused session on functional mobility with transfers, gait training in the room, and toileting.   Therapeutic Activity: Bed Mobility: Patient performed supine to/from sit with min A for LE management and trunk support to sit up. Provided verbal cues for sliding LEs off the bed first and sliding shoulders to the side to maintain hip precautions. Transfers: Patient performed sit to/from stand x1 and toilet transfer x1 with min-mod a using a RW, increased assist due to pain. Provided verbal cues for placing R hand down to push up/reach back and having L hand on the RW with L LE slightly ahead of R to off weight L LE. Required supervision for peri-care and total A for doffing/donning and incontinence brief and  shorts.  Gait Training:  Patient ambulated 12 feet to/from the bathroom using RW with min A-CGA for safety. Ambulated with step-to gait pattern leading with L, antalgic gait, decreased weight shift and stance time on L, and forward trunk flexion. Provided verbal cues for sequencing and erect posture.  Patient in bed, positioned as before, at end of session with breaks locked, bed alarm set, and all needs within reach. Patient reported 6/10 L hip pain at end of session.    Therapy Documentation Precautions:  Precautions Precautions: Fall, Posterior Hip Precaution Booklet Issued: Yes (comment) Precaution Comments: Reviewed posterior hip precautions with pt throughout Restrictions Weight Bearing Restrictions: Yes LLE Weight Bearing: Weight bearing as tolerated General: PT Amount of Missed Time (min): 30 Minutes PT Missed Treatment Reason: Pain   Therapy/Group: Individual Therapy  Nneoma Harral L Jajuan Skoog PT, DPT  10/30/2018, 4:25 PM

## 2018-10-31 ENCOUNTER — Inpatient Hospital Stay (HOSPITAL_COMMUNITY): Payer: PPO

## 2018-10-31 DIAGNOSIS — S72002S Fracture of unspecified part of neck of left femur, sequela: Secondary | ICD-10-CM

## 2018-10-31 LAB — CBC WITH DIFFERENTIAL/PLATELET
Abs Immature Granulocytes: 0.04 10*3/uL (ref 0.00–0.07)
Basophils Absolute: 0.1 10*3/uL (ref 0.0–0.1)
Basophils Relative: 1 %
Eosinophils Absolute: 0.2 10*3/uL (ref 0.0–0.5)
Eosinophils Relative: 3 %
HCT: 28.1 % — ABNORMAL LOW (ref 39.0–52.0)
Hemoglobin: 9.6 g/dL — ABNORMAL LOW (ref 13.0–17.0)
Immature Granulocytes: 1 %
Lymphocytes Relative: 12 %
Lymphs Abs: 0.9 10*3/uL (ref 0.7–4.0)
MCH: 30.4 pg (ref 26.0–34.0)
MCHC: 34.2 g/dL (ref 30.0–36.0)
MCV: 88.9 fL (ref 80.0–100.0)
Monocytes Absolute: 0.7 10*3/uL (ref 0.1–1.0)
Monocytes Relative: 10 %
Neutro Abs: 5.5 10*3/uL (ref 1.7–7.7)
Neutrophils Relative %: 73 %
Platelets: 344 10*3/uL (ref 150–400)
RBC: 3.16 MIL/uL — ABNORMAL LOW (ref 4.22–5.81)
RDW: 13.5 % (ref 11.5–15.5)
WBC: 7.4 10*3/uL (ref 4.0–10.5)
nRBC: 0 % (ref 0.0–0.2)

## 2018-10-31 LAB — BASIC METABOLIC PANEL
Anion gap: 8 (ref 5–15)
BUN: 14 mg/dL (ref 8–23)
CO2: 24 mmol/L (ref 22–32)
Calcium: 8.5 mg/dL — ABNORMAL LOW (ref 8.9–10.3)
Chloride: 107 mmol/L (ref 98–111)
Creatinine, Ser: 1.03 mg/dL (ref 0.61–1.24)
GFR calc Af Amer: >60 mL/min (ref >60)
GFR calc non Af Amer: >60 mL/min (ref >60)
Glucose, Bld: 146 mg/dL — ABNORMAL HIGH (ref 70–99)
Potassium: 4.7 mmol/L (ref 3.5–5.1)
Sodium: 139 mmol/L (ref 135–145)

## 2018-10-31 LAB — CULTURE, BLOOD (ROUTINE X 2)
Culture: NO GROWTH
Culture: NO GROWTH
Special Requests: ADEQUATE
Special Requests: ADEQUATE

## 2018-10-31 LAB — GLUCOSE, CAPILLARY
Glucose-Capillary: 106 mg/dL — ABNORMAL HIGH (ref 70–99)
Glucose-Capillary: 109 mg/dL — ABNORMAL HIGH (ref 70–99)
Glucose-Capillary: 103 mg/dL — ABNORMAL HIGH (ref 70–99)
Glucose-Capillary: 168 mg/dL — ABNORMAL HIGH (ref 70–99)

## 2018-10-31 MED ORDER — CYCLOBENZAPRINE HCL 5 MG PO TABS
5.0000 mg | ORAL_TABLET | Freq: Three times a day (TID) | ORAL | Status: DC | PRN
  Administered 2018-11-02: 5 mg via ORAL
  Filled 2018-10-31 (×2): qty 1

## 2018-10-31 MED ORDER — METHOCARBAMOL 500 MG PO TABS: 500.0000 mg | ORAL_TABLET | Freq: Four times a day (QID) | ORAL | Status: DC | PRN

## 2018-10-31 MED ORDER — MUSCLE RUB 10-15 % EX CREA
TOPICAL_CREAM | Freq: Three times a day (TID) | CUTANEOUS | Status: DC
  Administered 2018-10-31 – 2018-11-04 (×10): via TOPICAL
  Filled 2018-10-31: qty 85

## 2018-10-31 NOTE — Progress Notes (Signed)
Social Work Patient ID: [name], male   DOB: 1929/11/13, 85 y.o.   MRN: [id number]  Spoke with Scarlet-daughter via telephone to schedule family training for Thursday @ 3;00 needed to be later due to wife is slow moving and daughter teaches school. Will let team know.

## 2018-10-31 NOTE — Progress Notes (Signed)
Orthopedic Tech Progress Note Patient Details:  [name] 01-31-1929 [id number] At the moment I went to apply the hip abduction patient was with therapy. I told him once patient is back in room notify me so I can come and apply the pillow.   [Ortho Devices Type of Ortho Device: Abduction pillow Ortho Device/Splint Location: hips Ortho Device/Splint Interventions: Other (comment)   Post Interventions Patient Tolerated: Other (comment) Instructions Provided: Other (comment)   Janit Pagan 10/31/2018, 9:38 AM

## 2018-10-31 NOTE — Progress Notes (Signed)
Physical Therapy Session Note  Patient Details  Name: [name] MRN: [id number] Date of Birth: [birth year]  Today's Date: 10/31/2018 PT Individual Time: 1355-1500 PT Individual Time Calculation (min): 65 min   Short Term Goals: Week 1:  PT Short Term Goal 1 (Week 1): STG=LTG due to short ELOS.  Skilled Therapeutic Interventions/Progress Updates:     Patient in bed upon PT arrival. Patient alert and agreeable to PT session. Patient reported 8/10 L hip pain and B calf and quad muscle spasms L>R during session, RN made aware and provided pain medicine during session. PT provided repositioning, rest breaks, and distraction as pain interventions throughout session. Educated on muscle guarding and spasms and utilized gentle stretching throughout for management. Patient with adductor wedge in room, patient reports that it does not fit him. Also noted pitting edema in B LEs with patient in bed, RN and PA made aware, and orders placed by PA for B TED hose and TED hose were donned during session.   Therapeutic Activity: Bed Mobility: Patient performed supine to/from sit in a flat bed without use of bed rails with min A for trunk and LE management, increased assist due to stiffness and pain this session. Provided verbal cues for sliding LEs off the bed then pushing through elbows to sit up. Transfers: Patient performed sit to/from stand x4 with CGS using a RW. Provided verbal cues for placing L foot ahead to reduce the angle at his L hip to maintain hip precautions during transfers and pushing/reaching back with his R UE to off-weight his L LE.  Gait Training:  Patient ambulated 160 feet x2 using RW with CGA. Ambulated with step-to gait pattern leading with L, decreased weight shift and stance time on L, increased L knee and hip flexion, increased trunk flexion, and downward head gaze. Provided verbal cues for erect posture, increased L knee extension in stance, and looking ahead.  Therapeutic  Exercise: Patient performed the following exercises with verbal and tactile cues for proper technique. -B heel slides x10 with AAROM on L -B ankle pumps x10 -B SLR x5 with AAROM on L with increased knee flexion throughout -Seated L hamstring and heel cord stretch with gentle manual overpressure in sitting 2x1 min  Patient in bed at end of session with breaks locked, bed alarm set, and all needs within reach. Attempted to have patient sit in the recliner at end of session, however, patient was very uncomfortable with and without the w/c cushion in the chair and requested to end the session lying in the bed.    Therapy Documentation Precautions:  Precautions Precautions: Fall, Posterior Hip Precaution Booklet Issued: Yes (comment) Precaution Comments: Reviewed posterior hip precautions with pt throughout Restrictions Weight Bearing Restrictions: Yes LLE Weight Bearing: Weight bearing as tolerated General: PT Amount of Missed Time (min): 10 Minutes PT Missed Treatment Reason: Other (Comment)(Therapist late from previous session.)    Therapy/Group: Individual Therapy  Tykera Skates L Sheldon Sem PT, DPT  10/31/2018, 4:36 PM

## 2018-10-31 NOTE — Progress Notes (Addendum)
[place] PHYSICAL MEDICINE & REHABILITATION PROGRESS NOTE   Subjective/Complaints: Patient reports no pain at rest, but as soon as got up with PT< pain started acting up.  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   ROS: Denies CP, SOB, N/V/D  Objective:   No results found. Recent Labs    10/31/18 0859  WBC 7.4  HGB 9.6*  HCT 28.1*  PLT 344   Recent Labs    10/31/18 0859  NA 139  K 4.7  CL 107  CO2 24  GLUCOSE 146*  BUN 14  CREATININE 1.03  CALCIUM 8.5*    Intake/Output Summary (Last 24 hours) at 10/31/2018 1026 Last data filed at 10/30/2018 1230 Gross per 24 hour  Intake 350 ml  Output -  Net 350 ml     Physical Exam: Vital Signs Blood pressure (!) 180/66, pulse 70, temperature 99.4 F (37.4 C), temperature source Oral, resp. rate 18, height 5\' 11"  (1.803 m), weight 77.5 kg, SpO2 97 %. Constitutional: No distress . Vital signs and labs reviewed. HENT: Normocephalic.  Atraumatic. Eyes: EOMI. Conjugate gaze Cardiovascular: No JVD Respiratory: Normal effort.  No stridor. GI: Non-distended, soft, NT, ND, (+)BS Skin: Left hip dressing C/D/I Psych: Normal mood.  Normal behavior. Musc: Left hip with edema and tenderness Neurological: Alert; appropriate, scissoring slightly with legs noted in w/c. Esp LLE. HOH Motor: Bilateral upper extremities: UE 4-4+/5.  LLE: 3 -/5 HF, 3+/5 KE, 4/5 ADF/PF. RLE 4-/5 HF, KE and 5/5 ADF/PF.    Assessment/Plan: 1. Functional deficits secondary to L hemiarthroplasty of L hip due to L femoral neck fracture  which require 3+ hours per day of interdisciplinary therapy in a comprehensive inpatient rehab setting.  Physiatrist is providing close team supervision and 24 hour management of active medical problems listed below.  Physiatrist and rehab team continue to assess barriers to discharge/monitor patient progress toward functional and medical  goals  Care Tool:  Bathing  Bathing activity did not occur: Refused Body parts bathed by patient: Front perineal area, Buttocks   Body parts bathed by helper: Left lower leg, Right lower leg, Right upper leg, Left upper leg     Bathing assist Assist Level: Moderate Assistance - Patient 50 - 74%     Upper Body Dressing/Undressing Upper body dressing   What is the patient wearing?: Pull over shirt    Upper body assist Assist Level: Set up assist    Lower Body Dressing/Undressing Lower body dressing      What is the patient wearing?: Underwear/pull up, Pants     Lower body assist Assist for lower body dressing: Supervision/Verbal cueing     Toileting Toileting    Toileting assist Assist for toileting: Moderate Assistance - Patient 50 - 74%     Transfers Chair/bed transfer  Transfers assist     Chair/bed transfer assist level: Moderate Assistance - Patient 50 - 74% Chair/bed transfer assistive device: Museum/gallery exhibitions officer assist      Assist level: Minimal Assistance - Patient > 75% Assistive device: Walker-rolling Max distance: 12'   Walk 10 feet activity   Assist     Assist level: Minimal Assistance - Patient > 75% Assistive device: Walker-rolling   Walk 50 feet activity   Assist    Assist level: Contact Guard/Touching assist Assistive device: Walker-rolling    Walk 150 feet activity   Assist Walk 150 feet activity did not occur:  Safety/medical concerns(decreased strength/activity tolerance)         Walk 10 feet on uneven surface  activity   Assist Walk 10 feet on uneven surfaces activity did not occur: Safety/medical concerns(decreased strength/activity tolerance)         Wheelchair     Assist   Type of Wheelchair: Manual    Wheelchair assist level: Supervision/Verbal cueing Max wheelchair distance: 150'    Wheelchair 50 feet with 2 turns activity    Assist        Assist Level:  Supervision/Verbal cueing   Wheelchair 150 feet activity     Assist      Assist Level: Supervision/Verbal cueing   Blood pressure (!) 180/66, pulse 70, temperature 99.4 F (37.4 C), temperature source Oral, resp. rate 18, height 5\' 11"  (1.803 m), weight 77.5 kg, SpO2 97 %.  Medical Problem List and Plan: 1.Functional and mobility deficitssecondary to left FNF and subsequent left hip hemi  10/19- ordered adductor pad/pillow for when in bed to keep legs apart due to scissoring. Suggested pain meds prior to therapy.  Continue CIR 2. Antithrombotics: -DVT/anticoagulation:Pharmaceutical:Other (comment)--Eliquis -antiplatelet therapy: N/A 3. Pain Management:  10/16- changed to tramadol and tylenol prn-   Relatively controlled on 10/18 4. Mood:LCSW to follow for evaluation and support. -antipsychotic agents: N/A 5. Neuropsych: This patientiscapable of making decisions on hisown behalf. 6. Skin/Wound Care:routine pressure relief measures. Monitor wound daily. Protein supplements to help promote wound healing. 7. Fluids/Electrolytes/Nutrition:Strict I/O.  8. PAF: Monitor HR tid--on Eliquis and toprol for rate control.  9. Chronic diastolic CHF:On Toprol, Minoxidil and Lipitor. No longer taking lasix apparently -consider brief course of lasix  Daily weights ordered Filed Weights   10/27/18 1546 10/30/18 0444 10/31/18 0337  Weight: 80.1 kg 77.6 kg 77.5 kg   Appears stable on 10/18 10. HTN: Will monitor BP tid.   Norvasc increased to 10 on 10/17  Continue Clonidine,Toprol and Minoxidil.   Remains elevated on 10/18, we will not make further changes today 11. T2DM with neuropathy: Monitor BS ac/hs.   CBGs ordered CBG (last 3)  Recent Labs    10/30/18 1640 10/30/18 2129 10/31/18 0517  GLUCAP 111* 143* 106*     Pretty decent control 101/9 12. Acute renal injury:   Creatinine 1.05 on 10/16, labs ordered for tomorrow 13. ABLA:  Continue iron supplement. Monitor for any signs of bleeding.   Hemoglobin 8.6 on 10/16, continue to monitor, labs ordered for tomorrow  10/19- Hb 9.6 14. Fevers with leucocytosis:Resolved -IS, OOB 15. Constipation:Increase miralax to bid.  Continue to adjust meds as necessary 16.  Hyperkalemia  Potassium 5.1 on 10/16  Labs ordered for tomorrow  10/19- K+-4.7 17.  Wheezing  Continue inhaler as needed  Improved   LOS: 4 days A FACE TO FACE EVALUATION WAS PERFORMED  [name] 10/31/2018, 10:26 AM

## 2018-10-31 NOTE — Progress Notes (Signed)
Occupational Therapy Session Note  Patient Details  Name: [name] MRN: [id number] Date of Birth: 11/18/1929  Today's Date: 10/31/2018 OT Individual Time: 5009-3818 OT Individual Time Calculation (min): 55 min    Short Term Goals: Week 1:  OT Short Term Goal 1 (Week 1): STG = LTGs due to ELOS  Skilled Therapeutic Interventions/Progress Updates:    Pt resting in w/c upon arrival and agreeable to therapy.  Pt states his pain is much better since earlier in the morning.  Pt declined bathing tasks but agreeable to changing clothing.  Pt completed UB/LB dressing tasks at supervision level using AE appropriately with the exception of socks (dependent without AE). Pt amb with RW 143' to ADL apartment and practiced bed mobility X 3 with supervision.  Pt maintained hip precautions throughout task. Pt amb with RW back to room and returned to w/c.  Pt's daughter called to discuss arranging education.  Information given to CSW for follow up.  Pt requires more than a reasonable amount of time to complete all tasks.  Pt remained in w/c with all needs within reach and belt alarm activated.   Therapy Documentation Precautions:  Precautions Precautions: Fall, Posterior Hip Precaution Booklet Issued: Yes (comment) Precaution Comments: Reviewed posterior hip precautions with pt throughout Restrictions Weight Bearing Restrictions: Yes LLE Weight Bearing: Weight bearing as tolerated Pain:  "It's ok now since I'm moving"; pt denies pain; premedicated   Therapy/Group: Individual Therapy  Leroy Libman 10/31/2018, 10:00 AM

## 2018-10-31 NOTE — Progress Notes (Signed)
Occupational Therapy Session Note  Patient Details  Name: MILLAN LEGAN MRN: [id number] Date of Birth: 1929-05-12  Today's Date: 10/31/2018 OT Individual Time: 0700-0810 OT Individual Time Calculation (min): 70 min    Short Term Goals: Week 1:  OT Short Term Goal 1 (Week 1): STG = LTGs due to ELOS  Skilled Therapeutic Interventions/Progress Updates:    Pt resting in bed upon arrival with c/o L hip pain.  Pt stated he had not received pain meds.  RN notified.  Pt noted with slight internal rotation of LLE.  Pt states his LLE has been doing that since a previous stroke.  Discussed importance of keeping LLE in neutral.  Pillow place between knees while eating breakfast.  Discussed with MD about recommending an adductor foam pad to facilitate pt keeping LLE in neutral. Pt states he feels better since starting to move around.  Pt completed eating breakfast and sat EOB with supervision with HOB elevated and use of bed rails.  Pt amb with RW to sink for grooming tasks.  Pt declined changing clothing until later OT session.  Pt declined use of bathroom at this time.  Focus on standing balance, sit<>stand, activity tolerance, and safety awareness to increase independence with BADLs. All transitional movements with supervision/CGA this morning.  Pt remained in w/c with all needs within reach and belt alarm activated.   Therapy Documentation Precautions:  Precautions Precautions: Fall, Posterior Hip Precaution Booklet Issued: Yes (comment) Precaution Comments: Reviewed posterior hip precautions with pt throughout Restrictions Weight Bearing Restrictions: Yes LLE Weight Bearing: Weight bearing as tolerated Pain:  Pt c/o 8/10 pain in L hip; RN notified and meds admin during session   Therapy/Group: Individual Therapy  Leroy Libman 10/31/2018, 8:11 AM

## 2018-11-01 ENCOUNTER — Inpatient Hospital Stay (HOSPITAL_COMMUNITY): Payer: PPO

## 2018-11-01 ENCOUNTER — Inpatient Hospital Stay (HOSPITAL_COMMUNITY): Payer: PPO | Admitting: Occupational Therapy

## 2018-11-01 DIAGNOSIS — R7309 Other abnormal glucose: Secondary | ICD-10-CM

## 2018-11-01 DIAGNOSIS — Z96642 Presence of left artificial hip joint: Secondary | ICD-10-CM

## 2018-11-01 LAB — GLUCOSE, CAPILLARY
Glucose-Capillary: 104 mg/dL — ABNORMAL HIGH (ref 70–99)
Glucose-Capillary: 134 mg/dL — ABNORMAL HIGH (ref 70–99)
Glucose-Capillary: 105 mg/dL — ABNORMAL HIGH (ref 70–99)
Glucose-Capillary: 190 mg/dL — ABNORMAL HIGH (ref 70–99)

## 2018-11-01 MED ORDER — CLONIDINE HCL 0.2 MG PO TABS
0.2000 mg | ORAL_TABLET | Freq: Three times a day (TID) | ORAL | Status: DC
  Administered 2018-11-01 – 2018-11-05 (×12): 0.2 mg via ORAL
  Filled 2018-11-01 (×7): qty 1
  Filled 2018-11-01: qty 2
  Filled 2018-11-01 (×4): qty 1

## 2018-11-01 NOTE — Progress Notes (Signed)
[place] PHYSICAL MEDICINE & REHABILITATION PROGRESS NOTE   Subjective/Complaints: Patient seen sitting up in bed this morning.  He states he slept well overnight.  He notes improvement in strength.  ROS: Denies CP, SOB, N/V/D  Objective:   No results found. Recent Labs    10/31/18 0859  WBC 7.4  HGB 9.6*  HCT 28.1*  PLT 344   Recent Labs    10/31/18 0859  NA 139  K 4.7  CL 107  CO2 24  GLUCOSE 146*  BUN 14  CREATININE 1.03  CALCIUM 8.5*    Intake/Output Summary (Last 24 hours) at 11/01/2018 0859 Last data filed at 11/01/2018 0838 Gross per 24 hour  Intake 280 ml  Output -  Net 280 ml     Physical Exam: Vital Signs Blood pressure (!) 155/45, pulse 66, temperature 98.7 F (37.1 C), temperature source Oral, resp. rate 16, height 5\' 11"  (1.803 m), weight 74.3 kg, SpO2 97 %. Constitutional: No distress . Vital signs reviewed. HENT: Normocephalic.  Atraumatic. Eyes: EOMI. No discharge. Cardiovascular: No JVD. Respiratory: Normal effort.  No stridor. GI: Non-distended. Skin: Left hip hematoma Psych: Normal mood.  Normal behavior. Musc: Left hip with edema and tenderness, improving Neurological: Alert HOH Motor: Bilateral upper extremities: UE 4+/5.  LLE: 2+/5 HF, 3+/5 KE, 4/5 ADF/PF. RLE: 4-4+/5 HF, KE and 5/5 ADF/PF.   Assessment/Plan: 1. Functional deficits secondary to L hemiarthroplasty of L hip due to L femoral neck fracture  which require 3+ hours per day of interdisciplinary therapy in a comprehensive inpatient rehab setting.  Physiatrist is providing close team supervision and 24 hour management of active medical problems listed below.  Physiatrist and rehab team continue to assess barriers to discharge/monitor patient progress toward functional and medical goals  Care Tool:  Bathing  Bathing activity did not occur: Refused Body parts bathed by patient: Front perineal area, Buttocks   Body parts bathed by helper: Left lower leg, Right lower  leg, Right upper leg, Left upper leg     Bathing assist Assist Level: Moderate Assistance - Patient 50 - 74%     Upper Body Dressing/Undressing Upper body dressing   What is the patient wearing?: Pull over shirt    Upper body assist Assist Level: Set up assist    Lower Body Dressing/Undressing Lower body dressing      What is the patient wearing?: Underwear/pull up, Pants     Lower body assist Assist for lower body dressing: Supervision/Verbal cueing     Toileting Toileting    Toileting assist Assist for toileting: Moderate Assistance - Patient 50 - 74%     Transfers Chair/bed transfer  Transfers assist     Chair/bed transfer assist level: Contact Guard/Touching assist Chair/bed transfer assistive device: Programmer, multimedia   Ambulation assist      Assist level: Contact Guard/Touching assist Assistive device: Walker-rolling Max distance: 160'   Walk 10 feet activity   Assist     Assist level: Contact Guard/Touching assist Assistive device: Walker-rolling   Walk 50 feet activity   Assist    Assist level: Contact Guard/Touching assist Assistive device: Walker-rolling    Walk 150 feet activity   Assist Walk 150 feet activity did not occur: Safety/medical concerns(decreased strength/activity tolerance)  Assist level: Contact Guard/Touching assist Assistive device: Walker-rolling    Walk 10 feet on uneven surface  activity   Assist Walk 10 feet on uneven surfaces activity did not occur: Safety/medical concerns(decreased strength/activity tolerance)  Wheelchair     Assist Will patient use wheelchair at discharge?: Yes Type of Wheelchair: Manual    Wheelchair assist level: Supervision/Verbal cueing Max wheelchair distance: 150'    Wheelchair 50 feet with 2 turns activity    Assist        Assist Level: Supervision/Verbal cueing   Wheelchair 150 feet activity     Assist      Assist Level:  Supervision/Verbal cueing   Blood pressure (!) 155/45, pulse 66, temperature 98.7 F (37.1 C), temperature source Oral, resp. rate 16, height 5\' 11"  (1.803 m), weight 74.3 kg, SpO2 97 %.  Medical Problem List and Plan: 1.Functional and mobility deficitssecondary to left FNF and subsequent left hip hemi  Ordered adductor pad/pillow for when in bed to keep legs apart due to scissoring. Suggested pain meds prior to therapy.  Continue CIR 2. Antithrombotics: -DVT/anticoagulation:Pharmaceutical:Other (comment)--Eliquis -antiplatelet therapy: N/A 3. Pain Management:  10/16- changed to tramadol and tylenol prn  Relatively controlled on 10/20 4. Mood:LCSW to follow for evaluation and support. -antipsychotic agents: N/A 5. Neuropsych: This patientiscapable of making decisions on hisown behalf. 6. Skin/Wound Care:routine pressure relief measures. Monitor wound daily. Protein supplements to help promote wound healing. 7. Fluids/Electrolytes/Nutrition:Strict I/O.  8. PAF: Monitor HR tid--on Eliquis and toprol for rate control.  9. Chronic diastolic CHF:On Toprol, Minoxidil and Lipitor. No longer taking lasix apparently -consider brief course of lasix  Daily weights ordered Filed Weights   10/30/18 0444 10/31/18 0337 11/01/18 0500  Weight: 77.6 kg 77.5 kg 74.3 kg   ?Relaibility on 10/20 10. HTN: Will monitor BP tid.   Norvasc increased to 10 on 10/17  Continue metoprolol XL 12.5  Clonidine 0.1, increased to 0.2 on 10/20  Continue Clonidine,Toprol and Minoxidil.   Remains elevated on 10/20 11. T2DM with neuropathy: Monitor BS ac/hs.   CBGs ordered CBG (last 3)  Recent Labs    10/31/18 1712 10/31/18 2202 11/01/18 0628  GLUCAP 103* 168* 104*   Labile on 10/20 12. Acute renal injury: ? Resolved  Creatinine 1.03 on 10/19 13. ABLA: Continue iron supplement. Monitor for any signs of bleeding.   Hemoglobin 9.6 on 10/19 14. Fevers with  leucocytosis:Resolved -IS, OOB 15. Constipation:Increase miralax to bid.  Continue to adjust meds as necessary 16.  Hyperkalemia  Potassium 4.7 on 10/19 17.  Wheezing  Continue inhaler as needed  Improved   LOS: 5 days A FACE TO FACE EVALUATION WAS PERFORMED  Ankit Lorie Phenix 11/01/2018, 8:59 AM

## 2018-11-01 NOTE — Plan of Care (Signed)
  Problem: RH BOWEL ELIMINATION Goal: RH STG MANAGE BOWEL WITH ASSISTANCE Description: STG Manage Bowel with Mod I Assistance. Outcome: Progressing   Problem: RH SKIN INTEGRITY Goal: RH STG ABLE TO PERFORM INCISION/WOUND CARE W/ASSISTANCE Description: STG Able To Perform Incision/Wound Care With [employer]. Outcome: Progressing   Problem: RH SAFETY Goal: RH STG ADHERE TO SAFETY PRECAUTIONS W/ASSISTANCE/DEVICE Description: STG Adhere to Safety Precautions With Mod I Assistance/Device. Outcome: Progressing   Problem: RH PAIN MANAGEMENT Goal: RH STG PAIN MANAGED AT OR BELOW PT'S PAIN GOAL Description: Less than 3 Outcome: Progressing   Problem: Consults Goal: RH GENERAL PATIENT EDUCATION Description: See Patient Education module for education specifics. Outcome: Progressing

## 2018-11-01 NOTE — Progress Notes (Addendum)
Physical Therapy Session Note  Patient Details  Name: [name] MRN: [id number] Date of Birth: 1929-06-13  Today's Date: 11/01/2018 PT Individual Time: 0820-0915 PT Individual Time Calculation (min): 55 min   Short Term Goals: Week 1:  PT Short Term Goal 1 (Week 1): STG=LTG due to short ELOS.  Skilled Therapeutic Interventions/Progress Updates:     Session 1: Patient in bed eating breakfast upon PT arrival. Patient alert and requested to finish eating lunch and get pain medicine prior to PT session. Called RN for pain medicine. Patient missed 20 min of skilled PT due to eating breakfast. PT returned and patient was finished eating and agreeable to PT session. Patient reported 5/10 L hip pain during session. PT provided repositioning, rest breaks, and distraction as pain interventions throughout session. Patient with improved B LE edema this morning. PT donned B TED hose with patient in bed with total A for time management at beginning of session.  Therapeutic Activity: Bed Mobility: Patient performed supine to sit with min A for LE and trunk management with significant increased time to perform task this morning. Provided verbal cues for bringing LE's off the bed first then pushing through his elbows to sit up. Patient sat EOB and PT donned shorts with total A, pulling then up in standing.  Transfers: Patient performed sit to/from stand x1 with min A from an elevated bed, unable to maintain hip precautions and stand with bed on lowest setting. Provided verbal cues for placing L foot ahead to reduce the angle at the hip and shift weight to his R LE to stand and to push up/reach back with is R hand and keep his L hand on the RW.  Gait Training:  Patient ambulated 185 feet using RW with CGA and min A x1 due to LOB, patient stated that he became dizzy, and PT called for assistance for a w/c to allow patient to sit safely. Vitals: BP 147/64, HR 72, SPO2 96% in sitting on RA. Ambulated with  step-to progressing to step through gait pattern with cues, leading with L, decreased knee and hip extension on L, decreased stance time on L, mild toe in on L, able to correct with cues to maintain hip precautions, and forward trunk flexion with downward head gaze. Provided verbal cues for erect posture, looking ahead, increased step length, and reciprocal gait pattern, as tolerated, to promote L knee/hip extension in terminal stance.  Therapeutic Exercise: Patient performed the following exercises with verbal and tactile cues for proper technique. -L seated hamstring and heel cord stretch with light over pressure 2x1 min -B LAQ 2x10 with visual target to promote full knee extension  Patient in w/c at end of session with breaks locked, seat belt alarm set, and all needs within reach.   Session 2: Returned to make up missed time from this morning. Patient in w/c upon PT entry and agreeable to PT session focusing on transfer training and positioning in bed to assist patient back to bed. Patient reported 4/10 L hip pain during session. PT provided repositioning, rest breaks, and distraction as pain interventions throughout session.  Therapeutic Activity: Patient performed stand pivot transfer with min A and cues as above for hand and L foot placement. He required min A for LE management for sit to supine and scooting in the bed. PT doffed B tennis shoes with total A and placed a pillow under his L LE and a towel roll on the medial side to prevent L hip internal rotation,  as patient refused having the adductor wedge placed, stating it is too bit. PT donned patient's personal prevalon boot on L foot.  Educated patient on positioning in bed. Patient able to recall 3/3 hip precautions this session. Patient in bed with bed alarm set and all needs in reach at end of session.  Therapy Documentation Precautions:  Precautions Precautions: Fall, Posterior Hip Precaution Booklet Issued: Yes  (comment) Precaution Comments: Reviewed posterior hip precautions with pt throughout Restrictions Weight Bearing Restrictions: Yes LLE Weight Bearing: Weight bearing as tolerated General: PT Missed Treatment Reason: Other (Comment)(patient eating breakfast)    Therapy/Group: Individual Therapy  [name] PT, DPT  11/01/2018, 12:20 PM

## 2018-11-01 NOTE — Progress Notes (Signed)
Occupational Therapy Session Note  Patient Details  Name: [name] MRN: [id number] Date of Birth: 1929/04/28  Today's Date: 11/01/2018 OT Individual Time: 1047-1101 and 0626-9485 OT Individual Time Calculation (min): 14 min and 57 min and Today's Date: 11/01/2018 OT Missed Time: 61 Minutes Missed Time Reason: Patient unwilling/refused to participate without medical reason;Patient fatigue(cold)   Short Term Goals: Week 1:  OT Short Term Goal 1 (Week 1): STG = LTGs due to ELOS  Skilled Therapeutic Interventions/Progress Updates:    1) Limited treatment session due to pt reporting feeling cold and not feeling well.  Pt received supine in bed with pt reporting "shivering" and requesting to be covered up and heat in room raised.  Pt stating just not feeling well.  Discussed current goals and progress towards goals with encouragement to participate in treatment session.  However pt continuing to refuse but agreeable to attempting later in the afternoon after some rest.  2) Treatment session with focus on functional mobility and LB dressing with use of AE.  Pt received supine in bed reporting feeling somewhat better, but still feeling "off".  Pt completed bed mobility from flat bed with use of bed rails and min assist - pt requesting for increased assistance from therapist however encouraged to attempt as much as possible as he has been able to complete bed mobility with supervision.  Educated on AE for LB dressing with pt able to don socks with increased time and assist for setup on sockaid.  Pt utilized reacher and shoe horn when donning shoes.  Unable to fasten shoes - discussed use of shoe buttons vs elastic laces with pt to "think it over" and discuss with primary therapist.  Pt completed sit > stand from EOB with CGA and increased time. Pt reports pain in hip "when I move" but able to complete short distance ambulation in room and then returned to sitting in w/c.   Therapy  Documentation Precautions:  Precautions Precautions: Fall, Posterior Hip Precaution Booklet Issued: Yes (comment) Precaution Comments: Reviewed posterior hip precautions with pt throughout Restrictions Weight Bearing Restrictions: Yes LLE Weight Bearing: Weight bearing as tolerated General: General OT Amount of Missed Time: 61 Minutes Pain: Pain Assessment Pain Scale: 0-10 Pain Score: 5    Therapy/Group: Individual Therapy  Simonne Come 11/01/2018, 12:03 PM

## 2018-11-02 ENCOUNTER — Inpatient Hospital Stay (HOSPITAL_COMMUNITY): Payer: PPO

## 2018-11-02 LAB — GLUCOSE, CAPILLARY
Glucose-Capillary: 117 mg/dL — ABNORMAL HIGH (ref 70–99)
Glucose-Capillary: 121 mg/dL — ABNORMAL HIGH (ref 70–99)

## 2018-11-02 NOTE — Progress Notes (Signed)
Occupational Therapy Session Note  Patient Details  Name: [name] MRN: [id number] Date of Birth: October 30, 1929  Today's Date: 11/02/2018 OT Individual Time: 1300-1340 OT Individual Time Calculation (min): 40 min    Short Term Goals: Week 1:  OT Short Term Goal 1 (Week 1): STG = LTGs due to ELOS  Skilled Therapeutic Interventions/Progress Updates:    Pt resting in w/c upon arrival finishing lunch.  OT intervention with focus on functional amb with Rollator in simulated home environment.  Pt required min verbal cues for safety (locking brakes) on rollator when turning to sit on seat.  Pt amb with Rollator to ADL apartment without rest break.  Pt practiced turning and sitting on rollator seat with min verbal cues to adhere to hip precautions.  Pt also practiced sit<>stand from a lower surface (sofa). Pt required min A for sit>stand from sofa. Pt navigated safely in simulated home environment. Pt returned to room and returned to w/c.  Pt remained in w/c with all needs within reach and seat alarm activated.   Therapy Documentation Precautions:  Precautions Precautions: Fall, Posterior Hip Precaution Booklet Issued: Yes (comment) Precaution Comments: Reviewed posterior hip precautions with pt throughout Restrictions Weight Bearing Restrictions: Yes LLE Weight Bearing: Weight bearing as tolerated   Pain:  Pt c/o increased pain with activity (5/10); RN aware, repositioned   Therapy/Group: Individual Therapy  Leroy Libman 11/02/2018, 1:43 PM

## 2018-11-02 NOTE — Progress Notes (Addendum)
Physical Therapy Session Note  Patient Details  Name: [name] MRN: [id number] Date of Birth: [birth year]  Today's Date: 11/02/2018 PT Individual Time: 1100-1200 PT Individual Time Calculation (min): 60 min   Short Term Goals: Week 1:  PT Short Term Goal 1 (Week 1): STG=LTG due to short ELOS.  Skilled Therapeutic Interventions/Progress Updates:     Patient in bed upon PT arrival. Patient alert and agreeable to PT session. Patient reported 4/10 L hip pain, 8/10 during bed level exercises, during session, RN made aware and provided pain medicine during session. PT provided repositioning, rest breaks, and distraction as pain interventions throughout session.   Therapeutic Exercise: Patient performed the following exercises with verbal and tactile cues for proper technique. -L heel slides x3 on third trial patient reported increased L hip pain with muscle spasms, terminated exercises and called RN for pain medicine. Patient refused to continue with exercises after pain medicine provided, requested to get up instead. PT educated on importance of ROM exercises to maintain ROM in the hip for functional mobility. Patient agreeable to try exercises at a later time.   Therapeutic Activity: Bed Mobility: Patient performed supine to sit with supervision and increased time with the bed flat and without use of bed rails. Provided verbal cues for sliding LEs off the bed first before sitting up. Transfers: Patient performed sit to/from stand x1 using a RW x3 using the Rollator from mat table, rollator, and the [facility] with CGA-close supervision. Provided demonstration for use of breaks and stand<>sit transfer using the rollator. Provided verbal cues for use of breaks with rollator and hand placement when standing. Patient able to recall placing L LE ahead to sit/stand to reduce angle at the hip to maintain hip precautions.   Gait Training:  Patient ambulated 140 feet x1 using the RW and 85 feet and 155 feet  using Rollator with CGA-close supervision. Ambulated with step-to progressing to step-through gait pattern leading with L, significantly decreased gait speed and step length, patient reports this is baseline since his stroke due to intermittent dizziness when walking at fast speeds. Provided verbal cues for step-through gait pattern, looking ahead, and staying close to the rollator for safety. He went up/down 8-6" steps using B rails, to simulate home entry of 5 steps with B rails, with CGA. Provided demonstration and cues for leading with R when ascending and L when descending. Patient had difficulty recalling sequencing throughout activity.   Patient in w/c at end of session with breaks locked and all needs within reach. Discussed d/c of chair alarm with RN and OT, OT reported that patient attempted to get up on his own last night, RN stated she would place chair alarm pad under patient at this time.    Therapy Documentation Precautions:  Precautions Precautions: Fall, Posterior Hip Precaution Booklet Issued: Yes (comment) Precaution Comments: Reviewed posterior hip precautions with pt throughout Restrictions Weight Bearing Restrictions: Yes LLE Weight Bearing: Weight bearing as tolerated    Therapy/Group: Individual Therapy  [name] L [name] 11/02/2018, 4:33 PM

## 2018-11-02 NOTE — Progress Notes (Signed)
Occupational Therapy Session Note  Patient Details  Name: [name] MRN: [id number] Date of Birth: 10-25-1929  Today's Date: 11/02/2018 OT Individual Time: 0800-0900 OT Individual Time Calculation (min): 60 min    Short Term Goals: Week 1:  OT Short Term Goal 1 (Week 1): STG = LTGs due to ELOS  Skilled Therapeutic Interventions/Progress Updates:    Pt resting in bed upon arrival and agreeable to therapy.  Pt stated he got up during the night without assistance because he needed to go to bathroom. Educated pt on safety protocol and always waiting for staff. Nursing staff aware. Pt amb with RW to w/c at sink to complete UB dressing.  Pt declined changing pants this morning.  Pt completed grooming tasks standing at sink.  Pt amb with RW to ADL apartment with supervision.  Pt amb with RW in simulated home environment before returning to room.  Pt requires more than a reasonable amount of time to complete all tasks.  Pt remained seated in w/c with all needs witihin reach and belt alarm actiavted.   Therapy Documentation Precautions:  Precautions Precautions: Fall, Posterior Hip Precaution Booklet Issued: Yes (comment) Precaution Comments: Reviewed posterior hip precautions with pt throughout Restrictions Weight Bearing Restrictions: Yes LLE Weight Bearing: Weight bearing as tolerated General:   Vital Signs: Oxygen Therapy SpO2: 95 % O2 Device: Room Air Pain:  Pt c/o 2/10 pain in L hip when walking but no c/o pain when seated   Therapy/Group: Individual Therapy  Leroy Libman 11/02/2018, 10:22 AM

## 2018-11-02 NOTE — Progress Notes (Signed)
Physical Therapy Note  Patient Details  Name: [name] MRN: [id number] Date of Birth: 1930/01/10 Today's Date: 11/02/2018    Patient in bed sleeping upon PT arrival. Patient was easily aroused to verbal stimulation. Patient reported 8/10 L hip pain with muscle spasms with 2 heat packs on his L hip. RN made aware and reported that he had been provided a muscle relaxor, muscle rub, and heat packs just before PT arrival and that he was not due for any pain medicine at this time. Patient stated that he could not do any therapy at this time due to pain. PT repositioned patient's L LE with a pillow under his L LE with a towel roll on medial side to prevent internal rotation at the hip with his personal Prevalon boot donned on his L foot for pressure relief. Patient missed 30 min of skilled PT due to pain.    [name] L [name] PT, DPT  11/02/2018, 4:45 PM

## 2018-11-02 NOTE — Progress Notes (Signed)
[facility] PHYSICAL MEDICINE & REHABILITATION PROGRESS NOTE   Subjective/Complaints: Patient seen ambulating with therapies this AM.  He states he slept well overnight. He states he is improving daily.  He has questions about discharge.   ROS: Denies CP, SOB, N/V/D  Objective:   No results found. Recent Labs    10/31/18 0859  WBC 7.4  HGB 9.6*  HCT 28.1*  PLT 344   Recent Labs    10/31/18 0859  NA 139  K 4.7  CL 107  CO2 24  GLUCOSE 146*  BUN 14  CREATININE 1.03  CALCIUM 8.5*    Intake/Output Summary (Last 24 hours) at 11/02/2018 0955 Last data filed at 11/02/2018 0753 Gross per 24 hour  Intake 560 ml  Output -  Net 560 ml     Physical Exam: Vital Signs Blood pressure (!) 140/51, pulse 60, temperature 98.9 F (37.2 C), temperature source Oral, resp. rate 16, height 5\' 11"  (1.803 m), weight 74.9 kg, SpO2 95 %. Constitutional: No distress . Vital signs reviewed. HENT: Normocephalic.  Atraumatic. Eyes: EOMI. No discharge. Cardiovascular: No JVD. Respiratory: Normal effort.  No stridor. GI: Non-distended. Skin: Left hip hematoma Psych: Normal mood.  Normal behavior. Musc: Left hip with edema and tenderness, improving Neurological: Alert HOH Motor: Bilateral upper extremities: UE 4+/5.  LLE: 3-/5 HF, 3+/5 KE, 4/5 ADF/PF. RLE: 4-4+/5 HF, KE and 5/5 ADF/PF, stable.   Assessment/Plan: 1. Functional deficits secondary to L hemiarthroplasty of L hip due to L femoral neck fracture  which require 3+ hours per day of interdisciplinary therapy in a comprehensive inpatient rehab setting.  Physiatrist is providing close team supervision and 24 hour management of active medical problems listed below.  Physiatrist and rehab team continue to assess barriers to discharge/monitor patient progress toward functional and medical goals  Care Tool:  Bathing  Bathing activity did not occur: Refused Body parts bathed by patient: Front perineal area, Buttocks   Body parts  bathed by helper: Left lower leg, Right lower leg, Right upper leg, Left upper leg     Bathing assist Assist Level: Moderate Assistance - Patient 50 - 74%     Upper Body Dressing/Undressing Upper body dressing   What is the patient wearing?: Pull over shirt    Upper body assist Assist Level: Set up assist    Lower Body Dressing/Undressing Lower body dressing      What is the patient wearing?: Underwear/pull up, Pants     Lower body assist Assist for lower body dressing: Supervision/Verbal cueing     Toileting Toileting    Toileting assist Assist for toileting: Moderate Assistance - Patient 50 - 74%     Transfers Chair/bed transfer  Transfers assist     Chair/bed transfer assist level: Minimal Assistance - Patient > 75% Chair/bed transfer assistive device: Programmer, multimedia   Ambulation assist      Assist level: Minimal Assistance - Patient > 75% Assistive device: Walker-rolling Max distance: 185'   Walk 10 feet activity   Assist     Assist level: Contact Guard/Touching assist Assistive device: Walker-rolling   Walk 50 feet activity   Assist    Assist level: Contact Guard/Touching assist Assistive device: Walker-rolling    Walk 150 feet activity   Assist Walk 150 feet activity did not occur: Safety/medical concerns(decreased strength/activity tolerance)  Assist level: Minimal Assistance - Patient > 75% Assistive device: Walker-rolling    Walk 10 feet on uneven surface  activity   Assist Walk 10  feet on uneven surfaces activity did not occur: Safety/medical concerns(decreased strength/activity tolerance)         Wheelchair     Assist Will patient use wheelchair at discharge?: Yes Type of Wheelchair: Manual    Wheelchair assist level: Supervision/Verbal cueing Max wheelchair distance: 150'    Wheelchair 50 feet with 2 turns activity    Assist        Assist Level: Supervision/Verbal cueing    Wheelchair 150 feet activity     Assist      Assist Level: Supervision/Verbal cueing   Blood pressure (!) 140/51, pulse 60, temperature 98.9 F (37.2 C), temperature source Oral, resp. rate 16, height 5\' 11"  (1.803 m), weight 74.9 kg, SpO2 95 %.  Medical Problem List and Plan: 1.Functional and mobility deficitssecondary to left FNF and subsequent left hip hemi  Ordered adductor pad/pillow for when in bed to keep legs apart due to scissoring. Suggested pain meds prior to therapy.  Cont CIR  Team conference today to discuss current and goals and coordination of care, home and environmental barriers, and discharge planning with nursing, case manager, and therapies.  2. Antithrombotics: -DVT/anticoagulation:Pharmaceutical:Other (comment)--Eliquis -antiplatelet therapy: N/A 3. Pain Management:  10/16- changed to tramadol and tylenol prn  Relatively controlled on 10/21 4. Mood:LCSW to follow for evaluation and support. -antipsychotic agents: N/A 5. Neuropsych: This patientiscapable of making decisions on hisown behalf. 6. Skin/Wound Care:routine pressure relief measures. Monitor wound daily. Protein supplements to help promote wound healing. 7. Fluids/Electrolytes/Nutrition:Strict I/O.  8. PAF: Monitor HR tid--on Eliquis and toprol for rate control.  9. Chronic diastolic CHF:On Toprol, Minoxidil and Lipitor. No longer taking lasix apparently -consider brief course of lasix  Daily weights ordered Filed Weights   10/31/18 0337 11/01/18 0500 11/02/18 0652  Weight: 77.5 kg 74.3 kg 74.9 kg   Stable on 10/21 10. HTN: Will monitor BP tid.   Norvasc increased to 10 on 10/17  Continue metoprolol XL 12.5  Clonidine 0.1, increased to 0.2 on 10/20  Continue Clonidine,Toprol and Minoxidil.   Remains elevated on 10/21, will consider further increase tomorrow 11. T2DM with neuropathy: Monitor BS ac/hs.  CBG (last 3)  Recent Labs     11/01/18 1621 11/01/18 2057 11/02/18 0618  GLUCAP 105* 190* 117*   Labile on 10/21 12. Acute renal injury: ? Resolved  Creatinine 1.03 on 10/19 13. ABLA: Continue iron supplement. Monitor for any signs of bleeding.   Hemoglobin 9.6 on 10/19 14. Fevers with leucocytosis:Resolved -IS, OOB 15. Constipation:Increase miralax to bid.  Continue to adjust meds as necessary 16.  Hyperkalemia  Potassium 4.7 on 10/19 17.  Wheezing  Continue inhaler as needed  Improved   LOS: 6 days A FACE TO FACE EVALUATION WAS PERFORMED  Towana Stenglein Lorie Phenix 11/02/2018, 9:55 AM

## 2018-11-02 NOTE — Progress Notes (Signed)
Social Work Patient ID: [name], male   DOB: 05-Apr-1929, 85 y.o.   MRN: [id number]  Met with pt to discuss team conference goals supervision-mod/i level and target discharge 10/24. Wife and daughter to come in tomorrow for education and prepare for discharge home. See in tomorrow and pt feels good about his discharge date.

## 2018-11-02 NOTE — Plan of Care (Signed)
  Problem: RH Balance Goal: LTG Patient will maintain dynamic standing with ADLs (OT) Description: LTG:  Patient will maintain dynamic standing balance with assist during activities of daily living (OT)  Flowsheets (Taken 11/02/2018 0946) LTG: Pt will maintain dynamic standing balance during ADLs with: (downgraded due to slow, fluctuating progress) Supervision/Verbal cueing Note: downgraded due to slow, fluctuating progress   Problem: RH Bathing Goal: LTG Patient will bathe all body parts with assist levels (OT) Description: LTG: Patient will bathe all body parts with assist levels (OT) Flowsheets (Taken 11/02/2018 0946) LTG: Pt will perform bathing with assistance level/cueing: (downgraded due to slow, fluctuating progress) Supervision/Verbal cueing Note: downgraded due to slow, fluctuating progress   Problem: RH Dressing Goal: LTG Patient will perform lower body dressing w/assist (OT) Description: LTG: Patient will perform lower body dressing with assist, with/without cues in positioning using equipment (OT) Flowsheets (Taken 11/02/2018 0946) LTG: Pt will perform lower body dressing with assistance level of: (downgraded due to slow, fluctuating progress) Supervision/Verbal cueing Note: downgraded due to slow, fluctuating progress   Problem: RH Toileting Goal: LTG Patient will perform toileting task (3/3 steps) with assistance level (OT) Description: LTG: Patient will perform toileting task (3/3 steps) with assistance level (OT)  Flowsheets (Taken 11/02/2018 0946) LTG: Pt will perform toileting task (3/3 steps) with assistance level: (downgraded due to slow, fluctuating progress) Supervision/Verbal cueing Note: downgraded due to slow, fluctuating progress   Problem: RH Toilet Transfers Goal: LTG Patient will perform toilet transfers w/assist (OT) Description: LTG: Patient will perform toilet transfers with assist, with/without cues using equipment (OT) Flowsheets (Taken 11/02/2018  0946) LTG: Pt will perform toilet transfers with assistance level of: (downgraded due to slow, fluctuating progress) Supervision/Verbal cueing Note: downgraded due to slow, fluctuating progress   Problem: RH Tub/Shower Transfers Goal: LTG Patient will perform tub/shower transfers w/assist (OT) Description: LTG: Patient will perform tub/shower transfers with assist, with/without cues using equipment (OT) Flowsheets (Taken 11/02/2018 0946) LTG: Pt will perform tub/shower stall transfers with assistance level of: (downgraded due to slow, fluctuating progress) Supervision/Verbal cueing Note: downgraded due to slow, fluctuating progress

## 2018-11-03 ENCOUNTER — Inpatient Hospital Stay (HOSPITAL_COMMUNITY): Payer: PPO

## 2018-11-03 ENCOUNTER — Encounter (HOSPITAL_COMMUNITY): Payer: PPO

## 2018-11-03 ENCOUNTER — Inpatient Hospital Stay (HOSPITAL_COMMUNITY): Payer: PPO | Admitting: Physical Therapy

## 2018-11-03 ENCOUNTER — Ambulatory Visit (HOSPITAL_COMMUNITY): Payer: PPO | Admitting: Physical Therapy

## 2018-11-03 LAB — GLUCOSE, CAPILLARY
Glucose-Capillary: 101 mg/dL — ABNORMAL HIGH (ref 70–99)
Glucose-Capillary: 106 mg/dL — ABNORMAL HIGH (ref 70–99)

## 2018-11-03 MED ORDER — LISINOPRIL 5 MG PO TABS
2.5000 mg | ORAL_TABLET | Freq: Every day | ORAL | Status: DC
  Administered 2018-11-03 – 2018-11-05 (×3): 2.5 mg via ORAL
  Filled 2018-11-03 (×2): qty 1

## 2018-11-03 NOTE — Progress Notes (Signed)
Physical Therapy Session Note  Patient Details  Name: [name] MRN: [id number] Date of Birth: 1929/01/17  Today's Date: 11/03/2018 PT Individual Time: 6967-8938 and 1451-1535 PT Individual Time Calculation (min): 27 min and 44 min  Short Term Goals: Week 1:  PT Short Term Goal 1 (Week 1): STG=LTG due to short ELOS.  Skilled Therapeutic Interventions/Progress Updates:    Session 1: Pt received sitting in w/c and agreeable to therapy session with minimal encouragement. Pt able to recall 3/3 hip precautions with mod cuing - pt tends to rest L LE into slight internal rotation throughout session with cuing for correction. Sit<>stands using RW with CGA for steadying throughout session. Ambulated ~274ft using RW with CGA for steadying - demonstrated step-to pattern leading with L LE progressed to reciprocal pattern with increased distance - cuing for improved L LE gait mechanics as pt demonstrates decreased hip/knee flexion during swing and decreased hip/knee extension and plantar flexion at push-off. Pt requesting to return to bed stating he has been up in w/c most of the morning. Sit>supine with CGA for L LE management and cuing for maintaining L hip precautions.  With encouragement pt agreeable to perform supine exercises as follows: - heel slides with cuing to maintain hip rotation in neutral x 5 reps as pt reports he is unable to do more - hip abduction and back to neutral x 10 reps - ankle PF/DF x12 reps with education on importance of performing this throughout the day to assist with L LE edema Therapist provided multimodal cuing for correct form and encouragement to participate in exercises. Therapist donned hip abduction pillow and pt left supine in bed with needs in reach and bed alarm on.  Session 2: Pt received sitting in w/c and requiring encouragement to participate in therapy session. Pt educated that his family is coming for training and he attempted to call them using the call bell  as opposed to the phone - pt appeared to have intermittent confusion throughout sessin - therapist pointed this out to pt's daughter. [name] and daughter, [name], who is an Therapist, sports arrived for family education/training - pt's daughter was present throughout session for hands-on training as she states the patient's wife is unable to provide physical assistance (noted pt's wife ambulated with cane) - daughter reports that her and her 2 siblings will be rotating to provide pt with full support at discharge. Therapist educated pt's daughter on posterior hip precautions and the implications on pt's mobility throughout session as well as informed her that patient continues to require cuing to remember them. Pt ambulated ~125ft to car room using RW with CGA for steadying and therapist bringing w/c in event of fatigue. Ambulatory simulated car transfer (SUV height) using RW with CGA for steadying and min assist for L LE in/out of vehicle - extensively educated pt/daughter regarding how to properly set-up car and move L LE in/out to maintain hip precautions. Transported pt to main therapy gym in w/c for time management. Pt able to recall proper step-to pattern for stair navigation with min cuing. Therapist educated pt's daughter regarding proper AD management with stairs and proper positioning to provide pt assistance for increased safety. Ascended/descended 4 steps using B HRs with CGA for steadying and min cuing for proper sequencing of LEs. Pt then performed same task again a 2nd time with pt's daughter providing hands-on assistance with mod cuing from therapist. Pt left seated in therapy gym in w/c with his daughter present as hand-off to [place], [place].  Therapy Documentation Precautions:  Precautions Precautions: Fall, Posterior Hip Precaution Booklet Issued: Yes (comment) Precaution Comments: Reviewed posterior hip precautions with pt throughout Restrictions Weight Bearing Restrictions: Yes LLE Weight Bearing:  Weight bearing as tolerated  Pain: Session 1: Reports "it's alright right now because I took my pain medicine this morning."  Session 2: Pt reporting fairly significant L hip pain at beginning of session, unrated; however, RN reports pt received medication ~4minutes prior to therapist arrival. Pt's pain appeared to lessen throughout session as pt able to move with increasing ease and comfort.    Therapy/Group: Individual Therapy  Tawana Scale, PT, DPT 11/03/2018, 7:59 AM

## 2018-11-03 NOTE — Progress Notes (Signed)
Occupational Therapy Session Note  Patient Details  Name: [name] MRN: [id number] Date of Birth: 01-13-1930  Today's Date: 11/03/2018 OT Individual Time: 1315-1345 OT Individual Time Calculation (min): 30 min    Short Term Goals: Week 1:  OT Short Term Goal 1 (Week 1): STG = LTGs due to ELOS      Skilled Therapeutic Interventions/Progress Updates: Pt received in therapy gym, ending PT session with daughter present for [facility] session. Session focused on walk-in shower transfer with RW. Dtr educated on management of w/c parts, safe hand placement during transfers, RW management and providing assistance at hips PRN for steadying. Pt verbalized understanding and daughter able to return demonstrate transfer. Pt able to recall 2/3 hip precautions w/o cue; dtr able to recall 3/3 precautions. Pt and family educated on energy conservation for home environment, placing frequently used items withint easy reach and allowing extra time to complete ADL activities d/t fatigue. Dtr reporting that the pt has a shower chair at home; OT educated on idea of placing BSC in shower for easier sit <>stand from shower using arm rest. End of session pt left in w/c, safety belt on and all needs met.   Therapy Documentation Precautions:  Precautions Precautions: Fall, Posterior Hip Precaution Booklet Issued: Yes (comment) Precaution Comments: Reviewed posterior hip precautions with pt throughout Restrictions Weight Bearing Restrictions: Yes LLE Weight Bearing: Weight bearing as tolerated      Therapy/Group: Individual Therapy  Jonmarc Bodkin 11/03/2018, 4:20 PM

## 2018-11-03 NOTE — Progress Notes (Signed)
Occupational Therapy Session Note  Patient Details  Name: [name] MRN: [id number] Date of Birth: 05/09/1929  Today's Date: 11/03/2018 OT Individual Time: 0815-0910 OT Individual Time Calculation (min): 55 min    Short Term Goals: Week 1:  OT Short Term Goal 1 (Week 1): STG = LTGs due to ELOS  Skilled Therapeutic Interventions/Progress Updates:    OT intervention with focus on BADL training, functional amb with Rollator, standing balance, activity tolerance, and safety awareness to increase independence with BADLs and prepare for discharge. Pt engaged in bathing/dressing tasks with sit<>stand from w/c at sink.  Pt amb with Rollator in room to walk over to sink for bathing/dressing tasks.  Pt requires assistance with socks and shoes secondary to dressing on B feet.  Pt states his wife will assist with socks and shoes when he goes home.  Pt completes all other tasks with supervision, using reacher appropriately for LB dressing tasks.  Pt amb with Rollator to ADL apartment and practiced navigation in simulated home envirionment.  Pt practiced sitting on Rollator in a safe manor.  Pt returned to room and reamined in w/c with seat alarm activated and all needs within reach.   Therapy Documentation Precautions:  Precautions Precautions: Fall, Posterior Hip Precaution Booklet Issued: Yes (comment) Precaution Comments: Reviewed posterior hip precautions with pt throughout Restrictions Weight Bearing Restrictions: Yes LLE Weight Bearing: Weight bearing as tolerated Pain: Pt denies pain this morning  Therapy/Group: Individual Therapy  Leroy Libman 11/03/2018, 9:14 AM

## 2018-11-03 NOTE — Plan of Care (Signed)
  Problem: RH BOWEL ELIMINATION Goal: RH STG MANAGE BOWEL WITH ASSISTANCE Description: STG Manage Bowel with Mod I Assistance. Outcome: Progressing   Problem: RH SKIN INTEGRITY Goal: RH STG ABLE TO PERFORM INCISION/WOUND CARE W/ASSISTANCE Description: STG Able To Perform Incision/Wound Care With [employer]. Outcome: Progressing   Problem: RH SAFETY Goal: RH STG ADHERE TO SAFETY PRECAUTIONS W/ASSISTANCE/DEVICE Description: STG Adhere to Safety Precautions With Mod I Assistance/Device. Outcome: Progressing   Problem: RH PAIN MANAGEMENT Goal: RH STG PAIN MANAGED AT OR BELOW PT'S PAIN GOAL Description: Less than 3 Outcome: Progressing   Problem: Consults Goal: RH GENERAL PATIENT EDUCATION Description: See Patient Education module for education specifics. Outcome: Progressing

## 2018-11-03 NOTE — Progress Notes (Signed)
Social Work Patient ID: [name], male   DOB: 1929-10-21, 85 y.o.   MRN: [id number]  Wife and daughter were here for family education, but wife stayed in the room and daughter went to therapy with pt. Aware rollator is coming and home health set up. Wife concerned about the medications trazodone and flexiril he is on and wants them discharged. Both aware of pt's need for 24 hr supervision and wife will be providing this level of care. Move forward to DC Sat.

## 2018-11-03 NOTE — Progress Notes (Signed)
Occupational Therapy Session Note  Patient Details  Name: [name] MRN: [id number] Date of Birth: [birth year]  Today's Date: 11/03/2018 OT Individual Time: 1315-1345 OT Individual Time Calculation (min): 30 min  and Today's Date: 11/03/2018 OT Missed Time: 15 Minutes(eating lunch) Missed Time Reason: Other (comment)   Short Term Goals: Week 1:  OT Short Term Goal 1 (Week 1): STG = LTGs due to ELOS  Skilled Therapeutic Interventions/Progress Updates:    Pt sitting in w/c upon arrival with NT present.  Pt stated he had fallen asleep and was just now ready to eat lunch.  Pt missed 15 mins skilled OT services at beginning of scheduled session to allow time to eat lunch.  Pt stated he had the NT help him wash up.  Discussion ensued regarding bathing during therapy session.  Pt had consistently declined "washing up" in earlier session.  Pt agreed that he had declined earlier but just "felt dirty" all of a sudden.  Offered to have pt take a shower during this session.  He declined.  Discussed taking shower in the morning.  Pt agreeable.  OT intervention with focus on sit<>stand, standing balance, review of hip precautions, home safety.  Pt supervision for sit<>stand and standing balance.  Min verbal cues for hip precautions. Pt remained seated in w/c with seat alarm activatated and all needs within reach.   Therapy Documentation Precautions:  Precautions Precautions: Fall, Posterior Hip Precaution Booklet Issued: Yes (comment) Precaution Comments: Reviewed posterior hip precautions with pt throughout Restrictions Weight Bearing Restrictions: Yes LLE Weight Bearing: Weight bearing as tolerated General: General OT Amount of Missed Time: 15 Minutes(eating lunch) Pain:  Pt denies pain this afternoon   Therapy/Group: Individual Therapy  Leroy Libman 11/03/2018, 1:47 PM

## 2018-11-03 NOTE — Progress Notes (Signed)
Occupational Therapy Discharge Summary  Patient Details  Name: [name] MRN: [id number] Date of Birth: 02-25-1929  Patient has met 6 of 7 long term goals due to improved activity tolerance, improved balance, ability to compensate for deficits and improved awareness. Pt made steady progress with BADLs and functional tranfers during this admission. Pt requires assistance donning socks 2/2 R ankle wound and dressing but completes all other bathing/dressing and toileting tasks with supervision.  Pt requires min verbal cues for adherence to posterior hip precautions. Pt using Rollator for all functional transfers and functional amb in household envirionment.  [name] and daughter have been present for therapy/educaiton. Patient to discharge at overall Supervision level.  Patient's care partner is independent to provide the necessary physical and cognitive assistance at discharge.    Reasons goals not met: Pt continues to require assistance when donning socks due to Rt ankle wound with dressing  Recommendation:  Patient will benefit from ongoing skilled OT services in home health setting to continue to advance functional skills in the area of BADL and Reduce care partner burden.  Equipment: No equipment provided  Reasons for discharge: treatment goals met and discharge from hospital  Patient/family agrees with progress made and goals achieved: Yes  OT Discharge Vision Baseline Vision/History: Wears glasses Wears Glasses: Reading only Patient Visual Report: No change from baseline Vision Assessment?: Yes Tracking/Visual Pursuits: Decreased smoothness of vertical tracking;Decreased smoothness of horizontal tracking;Decreased smoothness of eye movement to LEFT superior field;Decreased smoothness of eye movement to LEFT inferior field;Decreased smoothness of eye movement to RIGHT superior field;Decreased smoothness of eye movement to RIGHT inferior field Saccades: Undershoots Perception   Perception: Within Functional Limits Praxis Praxis: Intact Cognition Overall Cognitive Status: Within Functional Limits for tasks assessed Arousal/Alertness: Awake/alert Orientation Level: Oriented X4 Attention: Sustained;Selective Sustained Attention: Appears intact Selective Attention: Appears intact Memory: Appears intact Immediate Memory Recall: Sock;Blue;Bed Memory Recall Sock: Without Cue Memory Recall Blue: Without Cue Memory Recall Bed: Without Cue Awareness: Appears intact Problem Solving: Appears intact Safety/Judgment: Appears intact Comments: min verbal cues for posterior hip precautions during functional tasks Sensation Sensation Light Touch: Appears Intact Hot/Cold: Appears Intact Proprioception: Appears Intact Stereognosis: Not tested Coordination Gross Motor Movements are Fluid and Coordinated: Yes Fine Motor Movements are Fluid and Coordinated: No    Trunk/Postural Assessment  Cervical Assessment Cervical Assessment: (forward head) Thoracic Assessment Thoracic Assessment: (kyphosis) Lumbar Assessment Lumbar Assessment: (posterior pelvic tilt)  Balance Static Sitting Balance Static Sitting - Balance Support: No upper extremity supported;Feet supported Static Sitting - Level of Assistance: 7: Independent Dynamic Sitting Balance Dynamic Sitting - Balance Support: During functional activity Dynamic Sitting - Level of Assistance: 6: Modified independent (Device/Increase time) Extremity/Trunk Assessment RUE Assessment RUE Assessment: Within Functional Limits LUE Assessment LUE Assessment: Within Functional Limits   Leroy Libman 11/03/2018, 1:46 PM

## 2018-11-03 NOTE — Progress Notes (Signed)
[place] PHYSICAL MEDICINE & REHABILITATION PROGRESS NOTE   Subjective/Complaints: Patient seen working with therapy this morning.  He states he slept well overnight.  He notes he is improving.  Able to perform sit to stand and stand to sit.  ROS: Denies CP, SOB, N/V/D  Objective:   No results found. Recent Labs    10/31/18 0859  WBC 7.4  HGB 9.6*  HCT 28.1*  PLT 344   Recent Labs    10/31/18 0859  NA 139  K 4.7  CL 107  CO2 24  GLUCOSE 146*  BUN 14  CREATININE 1.03  CALCIUM 8.5*    Intake/Output Summary (Last 24 hours) at 11/03/2018 0851 Last data filed at 11/02/2018 1834 Gross per 24 hour  Intake 400 ml  Output -  Net 400 ml     Physical Exam: Vital Signs Blood pressure (!) 140/54, pulse 63, temperature 99 F (37.2 C), resp. rate 18, height 5\' 11"  (1.803 m), weight 74.9 kg, SpO2 96 %. Constitutional: No distress . Vital signs reviewed. HENT: Normocephalic.  Atraumatic. Eyes: EOMI. No discharge. Cardiovascular: No JVD. Respiratory: Normal effort.  No stridor. GI: Non-distended. Skin: Left hip incision C/D/I Psych: Normal mood.  Normal behavior. Musc: Left hip tenderness improving Neurological: Alert HOH Motor: Bilateral upper extremities: UE 4+/5, stable.  LLE: 3+/5 HF, 3+/5 KE, 4/5 ADF/PF. RLE: 4-4+/5 HF, KE and 5/5 ADF/PF, stable.   Assessment/Plan: 1. Functional deficits secondary to L hemiarthroplasty of L hip due to L femoral neck fracture  which require 3+ hours per day of interdisciplinary therapy in a comprehensive inpatient rehab setting.  Physiatrist is providing close team supervision and 24 hour management of active medical problems listed below.  Physiatrist and rehab team continue to assess barriers to discharge/monitor patient progress toward functional and medical goals  Care Tool:  Bathing  Bathing activity did not occur: Refused Body parts bathed by patient: Front perineal area, Buttocks   Body parts bathed by helper: Left  lower leg, Right lower leg, Right upper leg, Left upper leg     Bathing assist Assist Level: Moderate Assistance - Patient 50 - 74%     Upper Body Dressing/Undressing Upper body dressing   What is the patient wearing?: Pull over shirt    Upper body assist Assist Level: Set up assist    Lower Body Dressing/Undressing Lower body dressing      What is the patient wearing?: Underwear/pull up, Pants     Lower body assist Assist for lower body dressing: Supervision/Verbal cueing     Toileting Toileting    Toileting assist Assist for toileting: Moderate Assistance - Patient 50 - 74%     Transfers Chair/bed transfer  Transfers assist     Chair/bed transfer assist level: Contact Guard/Touching assist Chair/bed transfer assistive device: Programmer, multimedia   Ambulation assist      Assist level: Contact Guard/Touching assist Assistive device: Rollator Max distance: 155'   Walk 10 feet activity   Assist     Assist level: Contact Guard/Touching assist Assistive device: Rollator   Walk 50 feet activity   Assist    Assist level: Contact Guard/Touching assist Assistive device: Rollator    Walk 150 feet activity   Assist Walk 150 feet activity did not occur: Safety/medical concerns(decreased strength/activity tolerance)  Assist level: Contact Guard/Touching assist Assistive device: Rollator    Walk 10 feet on uneven surface  activity   Assist Walk 10 feet on uneven surfaces activity did not occur: Safety/medical  concerns(decreased strength/activity tolerance)         Wheelchair     Assist Will patient use wheelchair at discharge?: Yes Type of Wheelchair: Manual    Wheelchair assist level: Supervision/Verbal cueing Max wheelchair distance: 150'    Wheelchair 50 feet with 2 turns activity    Assist        Assist Level: Supervision/Verbal cueing   Wheelchair 150 feet activity     Assist      Assist Level:  Supervision/Verbal cueing   Blood pressure (!) 140/54, pulse 63, temperature 99 F (37.2 C), resp. rate 18, height 5\' 11"  (1.803 m), weight 74.9 kg, SpO2 96 %.  Medical Problem List and Plan: 1.Functional and mobility deficitssecondary to left FNF and subsequent left hip hemi  Ordered adductor pad/pillow for when in bed to keep legs apart due to scissoring. Suggested pain meds prior to therapy.  Continue CIR  Wife with questions per nursing, no answer, left voicemail to call back 2. Antithrombotics: -DVT/anticoagulation:Pharmaceutical:Other (comment)--Eliquis -antiplatelet therapy: N/A 3. Pain Management:  10/16- changed to tramadol and tylenol prn  Relatively controlled on 10/22 4. Mood:LCSW to follow for evaluation and support. -antipsychotic agents: N/A 5. Neuropsych: This patientiscapable of making decisions on hisown behalf. 6. Skin/Wound Care:routine pressure relief measures. Monitor wound daily. Protein supplements to help promote wound healing. 7. Fluids/Electrolytes/Nutrition:Strict I/O.  8. PAF: Monitor HR tid--on Eliquis and toprol for rate control.  9. Chronic diastolic CHF:On Toprol, Minoxidil and Lipitor. No longer taking lasix apparently -consider brief course of lasix  Daily weights ordered Filed Weights   11/01/18 0500 11/02/18 0652 11/03/18 0500  Weight: 74.3 kg 74.9 kg 74.9 kg   Stable on 10/21 10. HTN: Will monitor BP tid.   Norvasc increased to 10 on 10/17  Continue metoprolol XL 12.5  Clonidine 0.1, increased to 0.2 on 10/20  Continue minoxidil 10 twice daily  Lisinopril 2.5 started on 10/22-allergy list reviewed-hyperkalemia 11. T2DM with neuropathy: Monitor BS ac/hs.  CBG (last 3)  Recent Labs    11/02/18 0618 11/02/18 2342 11/03/18 0631  GLUCAP 117* 121* 101*   ?  Stabilizing on 10/22 12. Acute renal injury: ? Resolved  Creatinine 1.03 on 10/19 13. ABLA: Continue iron supplement. Monitor for  any signs of bleeding.   Hemoglobin 9.6 on 10/19 14. Fevers with leucocytosis:Resolved -IS, OOB 15. Constipation:Increase miralax to bid.  Continue to adjust meds as necessary 16.  Hyperkalemia  Potassium 4.7 on 10/19, labs ordered for tomorrow given addition of ACE 17.  Wheezing  Continue inhaler as needed  Improved   LOS: 7 days A FACE TO FACE EVALUATION WAS PERFORMED  Caidence Higashi Lorie Phenix 11/03/2018, 8:51 AM

## 2018-11-03 NOTE — Progress Notes (Signed)
Male called, no id wanted to speak to nurse, informed that not available. She immediately stated that there was a big problem that every time she is here pt is in bed with blinds closed and door closed and has told the nurse not to give flexeril and another medication. Then continues that will be up here shortly and hung up. Walked down to room, pt up in w/c, blinds open and door open. Will inform nurse of call when returns.

## 2018-11-04 ENCOUNTER — Inpatient Hospital Stay (HOSPITAL_COMMUNITY): Payer: PPO | Admitting: Physical Therapy

## 2018-11-04 ENCOUNTER — Inpatient Hospital Stay (HOSPITAL_COMMUNITY): Payer: PPO | Admitting: Occupational Therapy

## 2018-11-04 ENCOUNTER — Inpatient Hospital Stay (HOSPITAL_COMMUNITY): Payer: PPO

## 2018-11-04 DIAGNOSIS — N390 Urinary tract infection, site not specified: Secondary | ICD-10-CM

## 2018-11-04 DIAGNOSIS — E1165 Type 2 diabetes mellitus with hyperglycemia: Secondary | ICD-10-CM

## 2018-11-04 LAB — BASIC METABOLIC PANEL
Anion gap: 7 (ref 5–15)
BUN: 19 mg/dL (ref 8–23)
CO2: 23 mmol/L (ref 22–32)
Calcium: 8.5 mg/dL — ABNORMAL LOW (ref 8.9–10.3)
Chloride: 107 mmol/L (ref 98–111)
Creatinine, Ser: 1.14 mg/dL (ref 0.61–1.24)
GFR calc Af Amer: >60 mL/min (ref >60)
GFR calc non Af Amer: 58 mL/min — ABNORMAL LOW (ref >60)
Glucose, Bld: 116 mg/dL — ABNORMAL HIGH (ref 70–99)
Potassium: 4.9 mmol/L (ref 3.5–5.1)
Sodium: 137 mmol/L (ref 135–145)

## 2018-11-04 MED ORDER — CLONIDINE HCL 0.2 MG PO TABS: 0.2000 mg | ORAL_TABLET | Freq: Three times a day (TID) | ORAL | 0 refills | Status: DC

## 2018-11-04 MED ORDER — FOSFOMYCIN TROMETHAMINE 3 G PO PACK
3.0000 g | PACK | Freq: Once | ORAL | Status: AC
  Administered 2018-11-04: 3 g via ORAL
  Filled 2018-11-04: qty 3

## 2018-11-04 MED ORDER — LISINOPRIL 2.5 MG PO TABS: 2.5000 mg | ORAL_TABLET | Freq: Every day | ORAL | 0 refills | Status: DC

## 2018-11-04 MED ORDER — MINOXIDIL 10 MG PO TABS: 10.0000 mg | ORAL_TABLET | Freq: Two times a day (BID) | ORAL | Status: DC

## 2018-11-04 MED ORDER — TRAMADOL HCL 50 MG PO TABS: 50.0000 mg | ORAL_TABLET | Freq: Four times a day (QID) | ORAL | 0 refills | Status: DC | PRN

## 2018-11-04 MED ORDER — AMLODIPINE BESYLATE 5 MG PO TABS: 10.0000 mg | ORAL_TABLET | Freq: Every day | ORAL | 3 refills | Status: DC

## 2018-11-04 MED ORDER — MUSCLE RUB 10-15 % EX CREA: 1.0000 "application " | TOPICAL_CREAM | Freq: Three times a day (TID) | CUTANEOUS | 0 refills | Status: DC

## 2018-11-04 NOTE — Discharge Instructions (Signed)
Inpatient Rehab Discharge Instructions  [name] Discharge date and time:  11/04/18  Activities/Precautions/ Functional Status: Activity: no lifting, driving, or strenuous exercise till cleared by MD Diet: Cardiac diet. Limit to low potassium foods.  Wound Care: keep wound clean and dry   Functional status:  ___ No restrictions     ___ Walk up steps independently _X__ 24/7 supervision/assistance   ___ Walk up steps with assistance ___ Intermittent supervision/assistance  ___ Bathe/dress independently _X__ Walk with walker    ___ Bathe/dress with assistance ___ Walk Independently    ___ Shower independently ___ Walk with assistance    _X__ Shower with assistance _X__ No alcohol     ___ Return to work/school ________   Special Instructions: 1. Continue to follow left total hip precautions till cleared by Dr. Alvan Dame.    COMMUNITY REFERRALS UPON DISCHARGE:    Home Health:   [facility] QTMAU:[phone]   Date of last service:11/05/2018  Medical Equipment/Items [place]  Agency/Supplier:[employer] [phone]   My questions have been answered and I understand these instructions. I will adhere to these goals and the provided educational materials after my discharge from the hospital.  Patient/Caregiver Signature _______________________________ Date __________  Clinician Signature _______________________________________ Date __________  Please bring this form and your medication list with you to all your follow-up doctor's appointments.

## 2018-11-04 NOTE — Progress Notes (Signed)
Physical Therapy Discharge Summary  Patient Details  Name: [name] MRN: [id number] Date of Birth: 12-31-1929  Today's Date: 11/04/2018 PT Individual Time:  -       Patient has met 9 of 11 long term goals due to improved activity tolerance, improved balance, improved postural control, increased strength, increased range of motion, decreased pain and ability to compensate for deficits. Patient to discharge at an ambulatory level Supervision. Patient's care partner attended family education and is independent to provide the necessary physical assistance at discharge.  Reasons goals not met: Patient continues to require close supervision for furniture transfers to ensure pt maintains hip precautions and supervision for  household ambulation to ensure safety with AD to decrease fall risk.   Recommendation:  Patient will benefit from ongoing skilled PT services in home health setting to continue to advance safe functional mobility, address ongoing impairments in strength/ROM, balance, functional mobility, activity tolerance, patient/caregiver education, and minimize fall risk.  Equipment: Rollator  Reasons for discharge: treatment goals met and discharge from hospital  Patient/family agrees with progress made and goals achieved: Yes   PT Discharge Precautions/Restrictions Precautions Precautions: Fall Restrictions Weight Bearing Restrictions: Yes LLE Weight Bearing: Weight bearing as tolerated Vision/Perception  Vision - Assessment Eye Alignment: Within Functional Limits Ocular Range of Motion: Within Functional Limits Alignment/Gaze Preference: Within Defined Limits Tracking/Visual Pursuits: Decreased smoothness of eye movement to LEFT superior field;Decreased smoothness of eye movement to LEFT inferior field;Decreased smoothness of eye movement to RIGHT inferior field;Decreased smoothness of eye movement to RIGHT superior field;Decreased smoothness of horizontal  tracking;Decreased smoothness of vertical tracking Saccades: Undershoots Perception Perception: Within Functional Limits Praxis Praxis: Intact  Cognition Overall Cognitive Status: Within Functional Limits for tasks assessed Attention: Focused;Sustained Focused Attention: Appears intact Sustained Attention: Appears intact Selective Attention: Appears intact Alternating Attention: Appears intact Memory: Impaired Memory Impairment: Decreased recall of new information;Decreased short term memory Decreased Short Term Memory: Verbal basic;Functional basic Awareness: Appears intact Problem Solving: Appears intact Safety/Judgment: Appears intact Sensation Sensation Light Touch: Appears Intact Proprioception: Appears Intact Coordination Gross Motor Movements are Fluid and Coordinated: No Fine Motor Movements are Fluid and Coordinated: No(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) Coordination and Movement Description: decreased strength/ROM and increased pain in L hip secondary to fx Finger Nose Finger Test: mild shakiness with LUE (due to premorbid CVA in 2001) Motor  Motor Motor - Skilled Clinical Observations: decreased strength/ROM/activity tolerance and increased pain in L hip secondary to hip fx Motor - Discharge Observations: Continues to have decreased strength/ROM/activity tolerance due to pain in L hip secondary to hip fx, making significant improvements with functional mobility  Mobility Bed Mobility Bed Mobility: Rolling Right;Rolling Left;Supine to Sit;Sit to Supine Rolling Right: Independent Rolling Left: Independent Left Sidelying to Sit: Independent Supine to Sit: Independent Sit to Supine: Independent Transfers Transfers: Sit to Stand;Stand to Sit;Stand Pivot Transfers Sit to Stand: Independent with assistive device Stand to Sit: Independent with assistive device Stand Pivot Transfers: Independent with assistive device Stand Pivot Transfer Details (indicate  cue type and reason): RW, supervision with Rollator due to cues for use of breaks/safety Transfer (Assistive device): Rolling walker Locomotion  Gait Ambulation: Yes Gait Assistance: Supervision/Verbal cueing Gait Distance (Feet): 150 Feet Assistive device: Rollator Gait Assistance Details: Verbal cues for safe use of DME/AE;Verbal cues for precautions/safety Gait Assistance Details: Verbal cues for staying close to the Rollator Gait Gait: Yes Gait Pattern: Step-to pattern;Decreased step length - right;Decreased stance time - left;Decreased stride length;Decreased hip/knee  flexion - left;Decreased dorsiflexion - left;Decreased weight shift to left;Left foot flat;Right foot flat;Antalgic;Trunk flexed;Narrow base of support;Decreased trunk rotation;Step-through pattern;Poor foot clearance - right Gait velocity: decreased Stairs / Additional Locomotion Stairs: Yes Stairs Assistance: Supervision/Verbal cueing Stair Management Technique: Two rails Number of Stairs: 12 Height of Stairs: 6 Ramp: Supervision/Verbal cueing(with Rollator) Curb: Patent attorney) Wheelchair Mobility Wheelchair Mobility: No  Trunk/Postural Assessment  Cervical Assessment Cervical Assessment: Exceptions to WFL(forward head) Thoracic Assessment Thoracic Assessment: Exceptions to WFL(kyphosis) Lumbar Assessment Lumbar Assessment: Exceptions to WFL(Posterior pelvic tilt) Postural Control Postural Control: Deficits on evaluation(decreased/delayed)  Balance Balance Balance Assessed: Yes Static Sitting Balance Static Sitting - Balance Support: No upper extremity supported;Feet supported Static Sitting - Level of Assistance: 7: Independent Dynamic Sitting Balance Dynamic Sitting - Balance Support: During functional activity;Feet supported;No upper extremity supported Dynamic Sitting - Level of Assistance: 6: Modified independent (Device/Increase time) Dynamic Sitting - Balance  Activities: Lateral lean/weight shifting;Reaching for objects Static Standing Balance Static Standing - Balance Support: Right upper extremity supported;Left upper extremity supported;During functional activity Static Standing - Level of Assistance: 6: Modified independent (Device/Increase time) Dynamic Standing Balance Dynamic Standing - Balance Support: Right upper extremity supported;Left upper extremity supported;During functional activity Dynamic Standing - Level of Assistance: 6: Modified independent (Device/Increase time) Dynamic Standing - Balance Activities: Lateral lean/weight shifting;Forward lean/weight shifting;Reaching for objects Extremity Assessment      RLE Assessment RLE Assessment: Within Functional Limits Active Range of Motion (AROM) Comments: WFL for all functional mobility General Strength Comments: Grossly in sitting 5/5 thoughout except hip flexion 4+/5 LLE Assessment LLE Assessment: Exceptions to [facility] Active Range of Motion (AROM) Comments: Hip flexion at least to 90 degrees in supine, limited due to hip precautions, and hip extension to neutral in supine; DF and knee extension limited due to tight hamstrings in sitting General Strength Comments: Grossly in sitting: 3+/5 hip flexion in reduced range for hip precautions, otherwise 4+/5 throughout    Cherie L Grunenberg PT, DPT Devine Dant, PT, DPT  11/04/2018, 7:38 AM

## 2018-11-04 NOTE — Progress Notes (Signed)
Occupational Therapy Session Note  Patient Details  Name: [name] MRN: [id number] Date of Birth: 20-Jun-1929  Today's Date: 11/04/2018 OT Individual Time: 5501-5868 OT Individual Time Calculation (min): 60 min    Short Term Goals: Week 1:  OT Short Term Goal 1 (Week 1): STG = LTGs due to ELOS  Skilled Therapeutic Interventions/Progress Updates:    Treatment session with focus on functional mobility in home environment, bathroom transfers, and LB dressing.  Pt received upright in w/c stating "I was just about to go back to bed".  Pt agreeable to therapy session, but declined shower and reports he "washed up good" this AM.  Pt ambulated to ADL apt with Rollator without additional assistance.  Engaged in walk-in shower transfer with min cues for sequencing when stepping in backwards.  Discussed bathroom setup and home routine with self-care tasks. Pt reports feeling need to toilet. Ambulated back to room with Rollator as above and transferred to toilet with supervision.  Pt completed toileting tasks with increased time and use of reacher when doffing shorts to change incontinence brief and then utilizing reacher when donning clean brief and shorts.  Pt requested assistance when donning hospital socks, - has demonstrated ability to complete with sock aid however pt reports his wife will complete for him.  Pt reports fatigue and requesting to return to bed.  Completed bed mobility with increased time and ability to maintain hip precautions.    Therapy Documentation Precautions:  Precautions Precautions: Fall Precaution Booklet Issued: Yes (comment) Precaution Comments: Reviewed posterior hip precautions with pt throughout Restrictions Weight Bearing Restrictions: Yes LLE Weight Bearing: Weight bearing as tolerated Pain:  Pt reports pain 4/10 in hip.  Premedicated.     Therapy/Group: Individual Therapy  Simonne Come 11/04/2018, 12:57 PM

## 2018-11-04 NOTE — Progress Notes (Signed)
Physical Therapy Session Note  Patient Details  Name: [name] MRN: [id number] Date of Birth: [birth year]  Today's Date: 11/04/2018 PT Individual Time: 1540-0867 PT Individual Time Calculation (min): 73 min   Short Term Goals: Week 1:  PT Short Term Goal 1 (Week 1): STG=LTG due to short ELOS.  Skilled Therapeutic Interventions/Progress Updates:    Pt received supine in bed with NT present and pt agreeable to therapy session. Supine>sit EOB with HOB partially elevated and using bedrails with CGA and min cuing on sequencing to maintain hip precautions. Sit<>stands using RW with CGA/close supervision for steadying/safety throughout session. Stand pivot EOB>w/c using RW with CGA for steadying. Transported to/from gym in w/c for energy conservation. Ambulated ~61ft x 2 in ADL apartment using RW with close supervision for safety. Sit<>stand recliner<>RW furniture transfer with pt requiring CGA for steadying upon returning to standing and tactile/verbal cuing to maintain hip precautions - pt educated on placing pillows in his recliner at home to increase the height to make maintaining hip precautions easier.   Performed the following exercises with L LE to review pt's HEP:  - seated hamstring stretch with pt demonstrating lacking ~13 degrees of extension, pt able to maintain hip flexion hip precautions during the stretch 3x1 minute hold - seated long arc quads with pt continuing to demonstrate lack of terminal knee extension due to decreased hamstring flexibility x10 reps - supine ankle pumps 2x10 reps with cuing for increased ankle ROM excursion - supine quad sets 2x12 reps with 5 second holds with tactile cuing for proper form and to promote increased extension - short arc quads 2x12 reps with pt demonstrating significant difficulty with this task due to decreased hamstring muscle length requiring assist for extension - heel slides 2x10 with cuing to maintain hip abducted and externally rotated -  supine hip abduction with adduction back to neutral x 5 reps with pt reporting this causes the most significant increase in his pain of all the exercises and defers continuing   Sit>supine on mat table with close supervision and cuing to main hip precautions. Supine>sit on mat table with supervision and cuing to use UEs to assist with trunk upright - pt demonstrating recall of proper L LE management. Transported back to room in w/c and pt left sitting up in w/c with needs in reach, seat belt alarm on, and pt's daughter present.  Therapy Documentation Precautions:  Precautions Precautions: Fall Precaution Booklet Issued: Yes (comment) Precaution Comments: Reviewed posterior hip precautions with pt throughout Restrictions Weight Bearing Restrictions: Yes LLE Weight Bearing: Weight bearing as tolerated  Pain:  Reports pain level of 5-6/10 stating "I'm alright" deferring intervention at this time - therapist provided rest breaks and repositioning as needed for pain management throughout.   Therapy/Group: Individual Therapy  Tawana Scale, PT, DPT 11/04/2018, 3:03 PM

## 2018-11-04 NOTE — Progress Notes (Signed)
Physical Therapy Session Note  Patient Details  Name: [name] MRN: [id number] Date of Birth: 09-22-1929  Today's Date: 11/04/2018 PT Individual Time: 0800-0900 PT Individual Time Calculation (min): 60 min   Short Term Goals: Week 1:  PT Short Term Goal 1 (Week 1): STG=LTG due to short ELOS.  Skilled Therapeutic Interventions/Progress Updates:     Patient in w/c upon PT arrival. Patient alert and agreeable to PT session. Patient reported 4/10 L hip pain during session, RN made aware and reported that patient was pre-medicated prior to session, patient did not recall this. PT provided repositioning, rest breaks, and distraction as pain interventions throughout session.   Therapeutic Activity: Bed Mobility: Patient performed rolling R/L and supine to/from sit with independently in the ADL bed with increased time to complete bringing his LEs onto the bed.  Transfers: Patient performed sit to/from stand x4 with supervision using the Rollator. Required cues for use of breaks appropriately throughout. He perform sit to/form stand and stand pivot x1 using the RW with mod I in the room without cues for safe use of RW and maintained hip precautions throughout.  He performed a furniture transfer from the ADL recliner with a pillow in the seat for increased elevation to maintain hip precautions in sitting and with standing with supervision using the Rollator. Provided cues for use of the breaks on the rollator and hand placement for transfer.  Gait Training:  Patient ambulated ~150 feet x2 using a rollator with supervision for safety. Ambulated with step-to progressing to some step-through gait pattern, mild antalgic gait on L, decreased weight shift and stance time on L, decreased L knee extension, forward trunk lean, and intermittent downward head gaze. Provided verbal cues for staying close to the rollator for safety and increased UE support, erect posture, looking ahead, and step-through gait  pattern.  Went up/down 12-6" steps with B rails with supervision and intermittent cuing for sequencing, ascending with R and descending with L with step-to gait pattern.  He ambulated up/down a ramp, over 10' of unlevel surfaces, and up/down a curb using the Rollator with CGA-supervision for safety with cues for use of rollator throughout.   Therapeutic Exercise: Patient performed the following exercises with verbal and tactile cues for proper technique. -L LAQ x10 -L standing hip flexion with B UE support x10  Patient in w/c at end of session with breaks locked, seat belt alarm set, and all needs within reach. Educated on energy conservation techniques, fall risk/prevention and activation of emergency services in the event of a fall. Patient receptive and agreeable to all education.   Therapy Documentation Precautions:  Precautions Precautions: Fall Precaution Booklet Issued: Yes (comment) Precaution Comments: Reviewed posterior hip precautions with pt throughout Restrictions Weight Bearing Restrictions: Yes LLE Weight Bearing: Weight bearing as tolerated    Therapy/Group: Individual Therapy  Alpa Salvo L Yoel Kaufhold PT, DPT  11/04/2018, 12:32 PM

## 2018-11-04 NOTE — Discharge Summary (Signed)
Physician Discharge Summary  Patient ID: [name] MRN: [id number] DOB/AGE: 83-Oct-1931 83 y.o.  Admit date: 10/27/2018 Discharge date: 11/05/2018  Discharge Diagnoses:  Principal Problem:   Femur fracture, left ([place]) Active Problems:   HYPERTENSION, BENIGN ESSENTIAL   URINARY INCONTINENCE, MALE   Constipation   Wheezing on expiration   Acute blood loss anemia   AKI (acute kidney injury) ([place])   Diabetic peripheral neuropathy (HCC)   Essential hypertension   Chronic diastolic congestive heart failure (HCC)   Discharged Condition: Stable   Significant Diagnostic Studies: Dg Chest 1 View  Result Date: 10/23/2018 CLINICAL DATA:  Hip fracture EXAM: CHEST  1 VIEW COMPARISON:  03/04/2018 FINDINGS: Patchy scarring or atelectasis at the lingula. No pleural effusion. Borderline to mild cardiomegaly with aortic atherosclerosis. No pneumothorax. IMPRESSION: No active disease.  Patchy atelectasis or scar at the lingula Electronically Signed   By: Donavan Foil M.D.   On: 10/23/2018 22:31   Dg Chest 2 View  Result Date: 10/27/2018 CLINICAL DATA:  Wheezing on expiration EXAM: CHEST - 2 VIEW COMPARISON:  Chest radiograph from one day prior. FINDINGS: Stable cardiomediastinal silhouette with top-normal heart size. No pneumothorax. No pleural effusion. No overt pulmonary edema. Hazy opacity at both lung bases. Hyperinflated lungs. IMPRESSION: 1. Hyperinflated lungs, suggesting obstructive lung disease. 2. Hazy opacities at both lung bases, favor atelectasis, difficult to exclude a component of aspiration or pneumonia. Chest radiograph follow-up suggested. Electronically Signed   By: Ilona Sorrel M.D.   On: 10/27/2018 16:57   Ct Head Wo Contrast  Result Date: 10/23/2018 CLINICAL DATA:  Fall. EXAM: CT HEAD WITHOUT CONTRAST CT CERVICAL SPINE WITHOUT CONTRAST TECHNIQUE: Multidetector CT imaging of the head and cervical spine was performed following the standard protocol without intravenous  contrast. Multiplanar CT image reconstructions of the cervical spine were also generated. COMPARISON:  Cervical spine x-rays dated January 11, 2018. CT head dated August 10, 2013. MRI cervical spine dated September 06, 2004. FINDINGS: CT HEAD FINDINGS Brain: No evidence of acute infarction, hemorrhage, hydrocephalus, extra-axial collection or mass lesion/mass effect. Small old infarcts in the right thalamus and right occipital lobe again noted. Vascular: Atherosclerotic vascular calcification of the carotid siphons. No hyperdense vessel. Skull: Normal. Negative for fracture or focal lesion. Sinuses/Orbits: No acute finding. Chronic bilateral maxillary sinus disease. Other: None. CT CERVICAL SPINE FINDINGS Alignment: Normal. Skull base and vertebrae: No acute fracture. No primary bone lesion or focal pathologic process. Soft tissues and spinal canal: No prevertebral fluid or swelling. No visible canal hematoma. Disc levels: Prior C4-C6 ACDF with solid interbody fusion. Moderate to severe adjacent segment degenerative disc disease at C6-C7. Mild degenerative disc disease at C2-C3 and C3-C4. Diffuse moderate to severe facet arthropathy throughout the cervical spine. Upper chest: Negative. Other: None. IMPRESSION: 1. No acute intracranial abnormality. Small old infarcts in the right thalamus and right occipital lobe. 2. No acute cervical spine fracture. Prior C4-C6 ACDF with moderate to severe adjacent segment disease at C6-C7. Electronically Signed   By: Titus Dubin M.D.   On: 10/23/2018 23:07   Ct Cervical Spine Wo Contrast  Result Date: 10/23/2018 CLINICAL DATA:  Fall. EXAM: CT HEAD WITHOUT CONTRAST CT CERVICAL SPINE WITHOUT CONTRAST TECHNIQUE: Multidetector CT imaging of the head and cervical spine was performed following the standard protocol without intravenous contrast. Multiplanar CT image reconstructions of the cervical spine were also generated. COMPARISON:  Cervical spine x-rays dated January 11, 2018.  CT head dated August 10, 2013. MRI cervical spine dated September 06, 2004. FINDINGS: CT HEAD FINDINGS Brain: No evidence of acute infarction, hemorrhage, hydrocephalus, extra-axial collection or mass lesion/mass effect. Small old infarcts in the right thalamus and right occipital lobe again noted. Vascular: Atherosclerotic vascular calcification of the carotid siphons. No hyperdense vessel. Skull: Normal. Negative for fracture or focal lesion. Sinuses/Orbits: No acute finding. Chronic bilateral maxillary sinus disease. Other: None. CT CERVICAL SPINE FINDINGS Alignment: Normal. Skull base and vertebrae: No acute fracture. No primary bone lesion or focal pathologic process. Soft tissues and spinal canal: No prevertebral fluid or swelling. No visible canal hematoma. Disc levels: Prior C4-C6 ACDF with solid interbody fusion. Moderate to severe adjacent segment degenerative disc disease at C6-C7. Mild degenerative disc disease at C2-C3 and C3-C4. Diffuse moderate to severe facet arthropathy throughout the cervical spine. Upper chest: Negative. Other: None. IMPRESSION: 1. No acute intracranial abnormality. Small old infarcts in the right thalamus and right occipital lobe. 2. No acute cervical spine fracture. Prior C4-C6 ACDF with moderate to severe adjacent segment disease at C6-C7. Electronically Signed   By: Titus Dubin M.D.   On: 10/23/2018 23:07   Pelvis Portable    Labs:  Basic Metabolic Panel: Recent Labs  Lab 10/31/18 0859 11/04/18 0503 11/05/18 0442  NA 139 137 137  K 4.7 4.9 4.9  CL 107 107 107  CO2 24 23 23   GLUCOSE 146* 116* 126*  BUN 14 19 21   CREATININE 1.03 1.14 1.26*  CALCIUM 8.5* 8.5* 8.5*    CBC: CBC Latest Ref Rng & Units 10/31/2018 10/28/2018 10/27/2018  WBC 4.0 - 10.5 K/uL 7.4 8.1 10.1  Hemoglobin 13.0 - 17.0 g/dL 9.6(L) 8.6(L) 8.8(L)  Hematocrit 39.0 - 52.0 % 28.1(L) 24.8(L) 26.0(L)  Platelets 150 - 400 K/uL 344 228 228    CBG: Recent Labs  Lab 11/01/18 2057  11/02/18 0618 11/02/18 2342 11/03/18 0631 11/03/18 1145  GLUCAP 190* 117* 121* 101* 106*    Brief HPI:   [name] is a 85 y.o. male with history of HTN, GERD,  Prostate cancer, PAF, prior stroke who was admitted on 10/24/18 after a fall with subsequent displaced left femoral neck fracture. He was evaluated by Dr. Alvan Dame and underwent Left hip hemi the same day. Post op WBAT with posterior hip precautions. Eliquis resumed 10/13. Hospital course significant for acute on chronic renal failure as well as fever up to 101 treated with IVF. CXR negative and UCS was pending. Therapy ongoing and he was noted to have limitations in mobility as well as ability to carry out ADLs. CIR recommended due to functional decline.    Hospital Course: [name] was admitted to rehab 10/27/2018 for inpatient therapies to consist of PT and OT at least three hours five days a week. Past admission physiatrist, therapy team and rehab RN have worked together to provide customized collaborative inpatient rehab.  He was maintained on Eliquis and follow up CBC showed that H/H stable.  Blood pressures were monitored on TID basis and medications have been slowly titrated upwards for tighter control.  Clonidine was increased to 0.2mg  tid and Norvasc was increased to 10 mg daily. Low dose lisinopril was added and follow up BMET showed that K+ to be stable at high normal levels. Intermittent wheezing noted but respiratory status is stable with prn use MDI.   Renal status showed mild AKI with SCr up to 1.26.  He has been educated on low potassium diet and recommend repeat BMET in 1-2 weeks to follow up for stability.  ABLA is improving.  Serial CBC showed WBC to be stable and he has had no urinary symptoms. He did have low grade temp elevations therefore was treated with dose of monurol for UTI. Hip incision is C/D/I. He is continent of bowel. His pain control has improved and he is back to using tramadol prn as at home. He  continues to requires cues to remember hip precautions and supervision is recommended at discharge. He will l continue to receive follow up HHPT, HHOT and HHRN by [facility]  after discharge  Rehab course: During patient's stay in rehab weekly team conferences were held to monitor patient's progress, set goals and discuss barriers to discharge. At admission, patient required min assist with mobility and max assist with basic self care tasks. He  has had improvement in activity tolerance, balance, postural control as well as ability to compensate for deficits.  He is able to complete ADL tasks with supervision. He requires supervision for tranfers and to ambulate 150' with cues and use of rollator. Family education has been completed regarding hip precautions, energy conservation as well as all aspects of safety.   Disposition:  Home  Diet: Heart Healthy  Special Instructions: 1. Need to limit potassium containing foods. 2. HHRN to draw BMET 10/29 with results to PCP.  3. Maintain hip precautions till cleared by Dr. Alvan Dame.   Allergies as of 11/05/2018      Reactions   Hctz [hydrochlorothiazide] Shortness Of Breath, Swelling, Other (See Comments)   "Swelling and dyspnea"   Hydralazine Shortness Of Breath, Other (See Comments)   Chest pain and GI issues also   Diovan [valsartan] Other (See Comments)   Elevated potassium- Hyperkalemia   Metformin And Related Other (See Comments)   Reaction not recalled   Nsaids Other (See Comments)   Other than Tylenol, he isn't to have these because he's on Eliquis   Other Other (See Comments)   Unnamed topically-applied B/P patch = Caused redness   Prednisone Other (See Comments)   Suicidal thoughts   Spironolactone Swelling   Site of swelling not recalled   Codeine Itching, Nausea Only   Oxycodone-acetaminophen Itching, Nausea Only      Medication List    STOP taking these medications   docusate sodium 100 MG capsule Commonly known  as: Colace   gabapentin 300 MG capsule Commonly known as: NEURONTIN   magnesium hydroxide 400 MG/5ML suspension Commonly known as: MILK OF MAGNESIA   methocarbamol 500 MG tablet Commonly known as: Robaxin   metoCLOPramide 5 MG tablet Commonly known as: REGLAN   mupirocin ointment 2 % Commonly known as: Bactroban     TAKE these medications   acetaminophen 500 MG tablet Commonly known as: TYLENOL Take 500 mg by mouth every 6 (six) hours as needed for mild pain or headache.   albuterol 108 (90 Base) MCG/ACT inhaler Commonly known as: VENTOLIN HFA Inhale 1-2 puffs into the lungs every 6 (six) hours as needed for wheezing or shortness of breath.   amLODipine 5 MG tablet Commonly known as: NORVASC Take 2 tablets (10 mg total) by mouth daily. What changed: See the new instructions.   apixaban 5 MG Tabs tablet Commonly known as: ELIQUIS Take 1 tablet (5 mg total) by mouth 2 (two) times daily.   Artificial Tears 1.4 % ophthalmic solution Generic drug: polyvinyl alcohol Place 1 drop into both eyes as needed for dry eyes.   atorvastatin 40 MG tablet Commonly known as: LIPITOR TAKE 1 TABLET BY  MOUTH DAILY.   cloNIDine 0.2 MG tablet Commonly known as: CATAPRES Take 1 tablet (0.2 mg total) by mouth every 8 (eight) hours. What changed:   medication strength  how much to take  how to take this  when to take this  additional instructions   ferrous sulfate 325 (65 FE) MG tablet Commonly known as: FerrouSul Take 1 tablet (325 mg total) by mouth 3 (three) times daily with meals for 14 days.   fluticasone 110 MCG/ACT inhaler Commonly known as: FLOVENT HFA Inhale 2 puffs into the lungs 2 (two) times daily.   lisinopril 2.5 MG tablet Commonly known as: ZESTRIL Take 1 tablet (2.5 mg total) by mouth daily.   metoprolol succinate 25 MG 24 hr tablet Commonly known as: Toprol XL Take 0.5 tablets (12.5 mg total) by mouth daily.   minoxidil 10 MG tablet Commonly known  as: LONITEN Take 1 tablet (10 mg total) by mouth 2 (two) times daily. What changed:   how much to take  when to take this  additional instructions   Muscle Rub 10-15 % Crea Apply 1 application topically 3 (three) times daily with meals. To BLE   pantoprazole 40 MG tablet Commonly known as: PROTONIX TAKE 1 TABLET (40 MG TOTAL) BY MOUTH DAILY BEFORE BREAKFAST.   polyethylene glycol 17 g packet Commonly known as: MIRALAX / GLYCOLAX Take 17 g by mouth 2 (two) times daily.   traMADol 50 MG tablet Commonly known as: Ultram Take 1 tablet (50 mg total) by mouth every 6 (six) hours as needed. What changed: how much to take   triamcinolone cream 0.5 % Commonly known as: KENALOG Apply 1 application topically daily as needed (for skin irritation).      Follow-up Information    Jamse Arn, MD Follow up.   Specialty: Physical Medicine and Rehabilitation Why: Office will call you with follow up appointment Contact information: [address] [place] [address] [phone]        Paralee Cancel, MD. Call on 11/07/2018.   Specialty: Orthopedic Surgery Why: for post op appointment Contact information: [address] [place] [address] [place] [address] [phone]        [name], MD. Call on 11/07/2018.   Specialty: Family Medicine Why: for post hospital follow up and recheck of [place].  Contact information: [address] [place] [address] [phone]           Signed: Bary Leriche 11/07/2018, 12:13 AM

## 2018-11-04 NOTE — Progress Notes (Addendum)
[facility] PHYSICAL MEDICINE & REHABILITATION PROGRESS NOTE   Subjective/Complaints: Patient seen working in the ADL apartment with therapies this morning.  He states he slept well overnight.  Therapies note increasing strength.  He is looking forward to discharge tomorrow.  ROS: Denies CP, SOB, N/V/D  Objective:   No results found. No results for input(s): WBC, HGB, HCT, PLT in the last 72 hours. Recent Labs    11/04/18 0503  NA 137  K 4.9  CL 107  CO2 23  GLUCOSE 116*  BUN 19  CREATININE 1.14  CALCIUM 8.5*    Intake/Output Summary (Last 24 hours) at 11/04/2018 0956 Last data filed at 11/04/2018 0748 Gross per 24 hour  Intake 657 ml  Output -  Net 657 ml     Physical Exam: Vital Signs Blood pressure (!) 161/47, pulse 70, temperature 99.1 F (37.3 C), temperature source Oral, resp. rate 18, height 5\' 11"  (1.803 m), weight 74 kg, SpO2 95 %. Constitutional: No distress . Vital signs reviewed. HENT: Normocephalic.  Atraumatic. Eyes: EOMI. No discharge. Cardiovascular: No JVD. Respiratory: Normal effort.  No stridor. GI: Non-distended. Skin: Left hip incision healing Psych: Normal mood.  Normal behavior. Musc: Left hip tenderness improving Neurological: Alert HOH Motor: Bilateral upper extremities: UE 4+/5, stable.  LLE: 4/5 HF, 4/5 KE, 4+-5/5 ADF/PF. RLE: 4-4+/5 HF, KE and 5/5 ADF/PF, stable.   Assessment/Plan: 1. Functional deficits secondary to L hemiarthroplasty of L hip due to L femoral neck fracture  which require 3+ hours per day of interdisciplinary therapy in a comprehensive inpatient rehab setting.  Physiatrist is providing close team supervision and 24 hour management of active medical problems listed below.  Physiatrist and rehab team continue to assess barriers to discharge/monitor patient progress toward functional and medical goals  Care Tool:  Bathing  Bathing activity did not occur: Refused Body parts bathed by patient: Right arm, Left  arm, Chest, Abdomen, Front perineal area, Buttocks, Right upper leg, Left upper leg, Face, Right lower leg, Left lower leg   Body parts bathed by helper: Left lower leg, Right lower leg, Right upper leg, Left upper leg     Bathing assist Assist Level: Supervision/Verbal cueing     Upper Body Dressing/Undressing Upper body dressing   What is the patient wearing?: Pull over shirt    Upper body assist Assist Level: Set up assist    Lower Body Dressing/Undressing Lower body dressing      What is the patient wearing?: Pants, Underwear/pull up     Lower body assist Assist for lower body dressing: Supervision/Verbal cueing     Toileting Toileting    Toileting assist Assist for toileting: Supervision/Verbal cueing     Transfers Chair/bed transfer  Transfers assist     Chair/bed transfer assist level: Contact Guard/Touching assist Chair/bed transfer assistive device: Programmer, multimedia   Ambulation assist      Assist level: Contact Guard/Touching assist Assistive device: Walker-rolling Max distance: 179ft   Walk 10 feet activity   Assist     Assist level: Contact Guard/Touching assist Assistive device: Walker-rolling   Walk 50 feet activity   Assist    Assist level: Contact Guard/Touching assist Assistive device: Walker-rolling    Walk 150 feet activity   Assist Walk 150 feet activity did not occur: Safety/medical concerns(decreased strength/activity tolerance)  Assist level: Contact Guard/Touching assist Assistive device: Walker-rolling    Walk 10 feet on uneven surface  activity   Assist Walk 10 feet on uneven surfaces activity  did not occur: Safety/medical concerns(decreased strength/activity tolerance)         Wheelchair     Assist Will patient use wheelchair at discharge?: Yes Type of Wheelchair: Manual    Wheelchair assist level: Supervision/Verbal cueing Max wheelchair distance: 150'    Wheelchair 50 feet  with 2 turns activity    Assist        Assist Level: Supervision/Verbal cueing   Wheelchair 150 feet activity     Assist      Assist Level: Supervision/Verbal cueing   Blood pressure (!) 161/47, pulse 70, temperature 99.1 F (37.3 C), temperature source Oral, resp. rate 18, height 5\' 11"  (1.803 m), weight 74 kg, SpO2 95 %.  Medical Problem List and Plan: 1.Functional and mobility deficitssecondary to left FNF and subsequent left hip hemi  Ordered adductor pad/pillow for when in bed to keep legs apart due to scissoring. Suggested pain meds prior to therapy.  Continue CIR, plan for d/c tomorrow  I attempted to call family yesterday, left voicemail, no call back, PA attempted to call phone numbers for family, left voicemails as well 2. Antithrombotics: -DVT/anticoagulation:Pharmaceutical:Other (comment)--Eliquis -antiplatelet therapy: N/A 3. Pain Management:  10/16- changed to tramadol and tylenol prn  Relatively controlled on 10/23 4. Mood:LCSW to follow for evaluation and support. -antipsychotic agents: N/A 5. Neuropsych: This patientiscapable of making decisions on hisown behalf. 6. Skin/Wound Care:routine pressure relief measures. Monitor wound daily. Protein supplements to help promote wound healing. 7. Fluids/Electrolytes/Nutrition:Strict I/O.  8. PAF: Monitor HR tid--on Eliquis and toprol for rate control.  9. Chronic diastolic CHF:On Toprol, Minoxidil and Lipitor. No longer taking lasix apparently -consider brief course of lasix  Daily weights ordered Filed Weights   11/03/18 0500 11/03/18 1511 11/04/18 0500  Weight: 74.9 kg 74.8 kg 74 kg   Stable on 10/23 10. HTN: Will monitor BP.   Norvasc increased to 10 on 10/17  Continue metoprolol XL 12.5  Clonidine 0.1, increased to 0.2 on 10/20  Continue minoxidil 10 twice daily  Lisinopril 2.5 started on 10/22-allergy list reviewed-hyperkalemia  Labile on 10/23,  will require ambulatory follow up 11. T2DM with neuropathy: Monitor BS ac/hs.  CBG (last 3)  Recent Labs    11/02/18 2342 11/03/18 0631 11/03/18 1145  GLUCAP 121* 101* 106*   Improving on 10/23 12. Acute renal injury: ? Resolved  Creatinine 1.14 on 10/23 13. ABLA: Continue iron supplement. Monitor for any signs of bleeding.   Hemoglobin 9.6 on 10/19 14. Fevers with leucocytosis:  Low grade fevers, will treat UTI given aforementioned and prosthetic joint -IS, OOB 15. Constipation:Increase miralax to bid.  Continue to adjust meds as necessary 16.  Hyperkalemia  Potassium 4.9 on 10/23, labs ordered for tomorrow given addition of ACE  Allergies reviewed-allergy to ACE is hyperkalemia, however no significant change in potassium since addition.  Further patient tends to run borderline high at baseline, diet changed  Diet changed to low potassium 17.  Wheezing  Continue inhaler as needed  Improved   LOS: 8 days A FACE TO FACE EVALUATION WAS PERFORMED  Mariah Harn Lorie Phenix 11/04/2018, 9:56 AM

## 2018-11-04 NOTE — Progress Notes (Signed)
Social Work Discharge Note   The overall goal for the admission was met for:   Discharge location: [place]  Length of Stay: Yes-9 DAYS  Discharge activity level: Yes-SUPERVISION LEVEL  Home/community participation: Yes  Services provided included: MD, RD, PT, OT, RN, CM, Pharmacy and SW  Financial Services: Private Insurance: [place]  Follow-up services arranged: Home Health: [facility], DME: [facility] and Patient/Family has no preference for HH/DME agencies  Comments (or additional information):WIFE AND DAUGHTER [place]. ALTHOUGH WIFE TENDS TO TALK MORE THAN DO-SENT DAUGHTER TO THERAPY WITH PT AND SHE STAYED IN THE ROOM  Patient/Family verbalized understanding of follow-up arrangements: Yes  Individual responsible for coordination of the follow-up plan: JOANNE-WIFE AND SCARLET-DAUGHTER  Confirmed correct DME delivered: Elease Hashimoto 11/04/2018    Elease Hashimoto

## 2018-11-04 NOTE — Plan of Care (Signed)
Nutrition Education Note  RD consulted for nutrition education regarding a low potassium diet.   RD provided "High-Potassium Foods List" handout from the Academy of Nutrition and Dietetics. Discussed importance of limiting high potassium foods. Pt asks RD to leave handout visible for his wife to review when she is available. Teach back method used.  Expect good compliance.  Body mass index is 22.75 kg/m. Pt meets criteria for normal weight based on current BMI.  Current diet order is Regular, patient is consuming approximately 50-75% of meals at this time. Labs and medications reviewed. No further nutrition interventions warranted at this time. RD contact information provided. If additional nutrition issues arise, please re-consult RD.   Gaynell Face, MS, RD, LDN Inpatient Clinical Dietitian Pager: [phone] Weekend/After Hours: [phone]

## 2018-11-05 LAB — BASIC METABOLIC PANEL
Anion gap: 7 (ref 5–15)
BUN: 21 mg/dL (ref 8–23)
CO2: 23 mmol/L (ref 22–32)
Calcium: 8.5 mg/dL — ABNORMAL LOW (ref 8.9–10.3)
Chloride: 107 mmol/L (ref 98–111)
Creatinine, Ser: 1.26 mg/dL — ABNORMAL HIGH (ref 0.61–1.24)
GFR calc Af Amer: 59 mL/min — ABNORMAL LOW (ref >60)
GFR calc non Af Amer: 51 mL/min — ABNORMAL LOW (ref >60)
Glucose, Bld: 126 mg/dL — ABNORMAL HIGH (ref 70–99)
Potassium: 4.9 mmol/L (ref 3.5–5.1)
Sodium: 137 mmol/L (ref 135–145)

## 2018-11-05 MED FILL — LISINOPRIL 2.5 MG TABLET: 2.5 | 30 days supply | Qty: 30 | Fill #0

## 2018-11-05 MED FILL — cloNIDine HCL 0.2 MG TABS: 0.2 | 30 days supply | Qty: 90 | Fill #0

## 2018-11-05 NOTE — Plan of Care (Signed)
  Problem: RH BOWEL ELIMINATION Goal: RH STG MANAGE BOWEL WITH ASSISTANCE Description: STG Manage Bowel with Mod I Assistance. Outcome: Completed/Met   Problem: RH SKIN INTEGRITY Goal: RH STG ABLE TO PERFORM INCISION/WOUND CARE W/ASSISTANCE Description: STG Able To Perform Incision/Wound Care With [employer]. Outcome: Completed/Met   Problem: RH SAFETY Goal: RH STG ADHERE TO SAFETY PRECAUTIONS W/ASSISTANCE/DEVICE Description: STG Adhere to Safety Precautions With Mod I Assistance/Device. Outcome: Completed/Met   Problem: RH PAIN MANAGEMENT Goal: RH STG PAIN MANAGED AT OR BELOW PT'S PAIN GOAL Description: Less than 3 Outcome: Completed/Met   Problem: Consults Goal: RH GENERAL PATIENT EDUCATION Description: See Patient Education module for education specifics. Outcome: Completed/Met

## 2018-11-05 NOTE — Progress Notes (Signed)
[facility] PHYSICAL MEDICINE & REHABILITATION PROGRESS NOTE   Subjective/Complaints: No new issues. Excited to go home. Thankful for team's efforts  ROS: Patient denies fever, rash, sore throat, blurred vision, nausea, vomiting, diarrhea, cough, shortness of breath or chest pain, joint or back pain, headache, or mood change.    Objective:   No results found. No results for input(s): WBC, HGB, HCT, PLT in the last 72 hours. Recent Labs    11/04/18 0503 11/05/18 0442  NA 137 137  K 4.9 4.9  CL 107 107  CO2 23 23  GLUCOSE 116* 126*  BUN 19 21  CREATININE 1.14 1.26*  CALCIUM 8.5* 8.5*    Intake/Output Summary (Last 24 hours) at 11/05/2018 0842 Last data filed at 11/04/2018 1810 Gross per 24 hour  Intake 222 ml  Output -  Net 222 ml     Physical Exam: Vital Signs Blood pressure (!) 144/55, pulse 71, temperature 98.7 F (37.1 C), temperature source Oral, resp. rate 16, height 5\' 11"  (1.803 m), weight 76.6 kg, SpO2 96 %. Constitutional: No distress . Vital signs reviewed. HEENT: EOMI, oral membranes moist Neck: supple Cardiovascular: RRR without murmur. No JVD    Respiratory: CTA Bilaterally without wheezes or rales. Normal effort    GI: BS +, non-tender, non-distended  Skin: Left hip incision healing nicely Psych: Normal mood.  Normal behavior. Musc: Left hip tenderness improving Neurological: Alert HOH Motor: Bilateral upper extremities: UE 4+/5  LLE: 4/5 HF, 4/5 KE, 4+-5/5 ADF/PF. RLE: 4-4+/5 HF, KE and 5/5 ADF/PF--stable  Assessment/Plan: 1. Functional deficits secondary to L hemiarthroplasty of L hip due to L femoral neck fracture  which require 3+ hours per day of interdisciplinary therapy in a comprehensive inpatient rehab setting.  Physiatrist is providing close team supervision and 24 hour management of active medical problems listed below.  Physiatrist and rehab team continue to assess barriers to discharge/monitor patient progress toward functional and  medical goals  Care Tool:  Bathing  Bathing activity did not occur: Refused Body parts bathed by patient: Right arm, Left arm, Chest, Abdomen, Front perineal area, Buttocks, Right upper leg, Left upper leg, Face, Right lower leg, Left lower leg   Body parts bathed by helper: Left lower leg, Right lower leg, Right upper leg, Left upper leg     Bathing assist Assist Level: Supervision/Verbal cueing     Upper Body Dressing/Undressing Upper body dressing   What is the patient wearing?: Pull over shirt    Upper body assist Assist Level: Set up assist    Lower Body Dressing/Undressing Lower body dressing      What is the patient wearing?: Pants, Underwear/pull up     Lower body assist Assist for lower body dressing: Supervision/Verbal cueing     Toileting Toileting    Toileting assist Assist for toileting: Supervision/Verbal cueing     Transfers Chair/bed transfer  Transfers assist     Chair/bed transfer assist level: Independent with assistive device Chair/bed transfer assistive device: Museum/gallery exhibitions officer assist      Assist level: Supervision/Verbal cueing Assistive device: Rollator Max distance: 150'   Walk 10 feet activity   Assist     Assist level: Supervision/Verbal cueing Assistive device: Rollator   Walk 50 feet activity   Assist    Assist level: Supervision/Verbal cueing Assistive device: Rollator    Walk 150 feet activity   Assist Walk 150 feet activity did not occur: Safety/medical concerns(decreased strength/activity tolerance)  Assist level: Supervision/Verbal cueing  Assistive device: Rollator    Walk 10 feet on uneven surface  activity   Assist Walk 10 feet on uneven surfaces activity did not occur: Safety/medical concerns(decreased strength/activity tolerance)   Assist level: Contact Guard/Touching assist Assistive device: Rollator   Wheelchair     Assist Will patient use wheelchair at  discharge?: No Type of Wheelchair: Manual Wheelchair activity did not occur: N/A  Wheelchair assist level: Supervision/Verbal cueing Max wheelchair distance: 150'    Wheelchair 50 feet with 2 turns activity    Assist    Wheelchair 50 feet with 2 turns activity did not occur: N/A   Assist Level: Supervision/Verbal cueing   Wheelchair 150 feet activity     Assist  Wheelchair 150 feet activity did not occur: N/A   Assist Level: Supervision/Verbal cueing   Blood pressure (!) 144/55, pulse 71, temperature 98.7 F (37.1 C), temperature source Oral, resp. rate 16, height 5\' 11"  (1.803 m), weight 76.6 kg, SpO2 96 %.  Medical Problem List and Plan: 1.Functional and mobility deficitssecondary to left FNF and subsequent left hip hemi  Ordered adductor pad/pillow for when in bed to keep legs apart due to scissoring. Suggested pain meds prior to therapy.  -dc home today 2. Antithrombotics: -DVT/anticoagulation:Pharmaceutical:Other (comment)--Eliquis -antiplatelet therapy: N/A 3. Pain Management:  10/16- changed to tramadol and tylenol prn  Relatively controlled on 10/24 4. Mood:LCSW to follow for evaluation and support. -antipsychotic agents: N/A 5. Neuropsych: This patientiscapable of making decisions on hisown behalf. 6. Skin/Wound Care:routine pressure relief measures. Monitor wound daily. Protein supplements to help promote wound healing. 7. Fluids/Electrolytes/Nutrition:Strict I/O.  8. PAF: Monitor HR tid--on Eliquis and toprol for rate control.  9. Chronic diastolic CHF:On Toprol, Minoxidil and Lipitor. No longer taking lasix apparently  -will need to follow weights at home    [facility] Weights   11/03/18 1511 11/04/18 0500 11/05/18 0500  Weight: 74.8 kg 74 kg 76.6 kg   Sl increase on 10/24 10. HTN: Will monitor BP.   Norvasc increased to 10 on 10/17  Continue metoprolol XL 12.5  Clonidine 0.1, increased to 0.2 on  10/20  Continue minoxidil 10 twice daily  Lisinopril 2.5 started on 10/22-allergy list reviewed-hyperkalemia  Borderline 10/24, will require ambulatory follow up 11. T2DM with neuropathy: Monitor BS ac/hs.  CBG (last 3)  Recent Labs    11/02/18 2342 11/03/18 0631 11/03/18 1145  GLUCAP 121* 101* 106*   Improving on 10/23 12. Acute renal injury: ? Resolved  Creatinine 1.14 on 10/23 13. ABLA: Continue iron supplement. Monitor for any signs of bleeding.   Hemoglobin 9.6 on 10/19 14. Fevers with leucocytosis:  Low grade fevers, treating UTI given aforementioned and prosthetic joint -IS, OOB 15. Constipation:Increased miralax to bid.  Continue to adjust meds as necessary 16.  Hyperkalemia  Potassium 4.9 on 10/23, --4.9 again today 10/24  Allergies reviewed-allergy to ACE is hyperkalemia, however no significant change in potassium since addition.  Further patient tends to run borderline high at baseline, diet changed  Diet changed to low potassium  -follow up as outpt 17.  Wheezing  Continue inhaler as needed  Improved   LOS: 9 days A FACE TO FACE EVALUATION WAS PERFORMED  Meredith Staggers 11/05/2018, 8:42 AM

## 2018-11-06 DIAGNOSIS — Z7901 Long term (current) use of anticoagulants: Secondary | ICD-10-CM | POA: Diagnosis not present

## 2018-11-06 DIAGNOSIS — I480 Paroxysmal atrial fibrillation: Secondary | ICD-10-CM | POA: Diagnosis not present

## 2018-11-06 DIAGNOSIS — R2689 Other abnormalities of gait and mobility: Secondary | ICD-10-CM | POA: Diagnosis not present

## 2018-11-06 DIAGNOSIS — E1140 Type 2 diabetes mellitus with diabetic neuropathy, unspecified: Secondary | ICD-10-CM | POA: Diagnosis not present

## 2018-11-06 DIAGNOSIS — S72002D Fracture of unspecified part of neck of left femur, subsequent encounter for closed fracture with routine healing: Secondary | ICD-10-CM | POA: Diagnosis not present

## 2018-11-06 DIAGNOSIS — Z96642 Presence of left artificial hip joint: Secondary | ICD-10-CM | POA: Diagnosis not present

## 2018-11-06 DIAGNOSIS — I10 Essential (primary) hypertension: Secondary | ICD-10-CM | POA: Diagnosis not present

## 2018-11-07 ENCOUNTER — Telehealth: Payer: Self-pay | Admitting: *Deleted

## 2018-11-07 ENCOUNTER — Telehealth: Payer: Self-pay | Admitting: Family Medicine

## 2018-11-07 ENCOUNTER — Ambulatory Visit: Payer: PPO | Admitting: Internal Medicine

## 2018-11-07 NOTE — Telephone Encounter (Signed)
Called pt to do hospital follow up call. [name] answered. States they had a horrible experience while in the hospital. Reports that her daughter has reached out to [facility] regarding questions they have about pt's med. Wife declines scheduling visit at this time and states they will wait to hear back from [facility].

## 2018-11-07 NOTE — Telephone Encounter (Signed)
Will, with [facility], calling to request VO for OT.  Freq: 1w3

## 2018-11-07 NOTE — Telephone Encounter (Signed)
Called patient left message for someone to give me a call to have patient scheduled with pcp virtually

## 2018-11-07 NOTE — Telephone Encounter (Signed)
Copied from [place] [id number]. Topic: General - Other >> Nov 07, 2018  2:15 PM Keene Breath wrote: Reason for CRM: Patient's wife and daughter would like a call back regarding patient's medications.  He was in the hospital and the medications have been mixed up and they are not sure of what the patient should be taking and they need the nurse to explain what the patient should be taking.  CB# [phone]

## 2018-11-08 NOTE — Telephone Encounter (Signed)
Verbal orders given  

## 2018-11-09 ENCOUNTER — Telehealth: Payer: Self-pay | Admitting: Internal Medicine

## 2018-11-09 ENCOUNTER — Telehealth: Payer: Self-pay | Admitting: *Deleted

## 2018-11-09 DIAGNOSIS — E1140 Type 2 diabetes mellitus with diabetic neuropathy, unspecified: Secondary | ICD-10-CM | POA: Diagnosis not present

## 2018-11-09 DIAGNOSIS — S72002D Fracture of unspecified part of neck of left femur, subsequent encounter for closed fracture with routine healing: Secondary | ICD-10-CM | POA: Diagnosis not present

## 2018-11-09 DIAGNOSIS — Z96642 Presence of left artificial hip joint: Secondary | ICD-10-CM | POA: Diagnosis not present

## 2018-11-09 DIAGNOSIS — R2689 Other abnormalities of gait and mobility: Secondary | ICD-10-CM | POA: Diagnosis not present

## 2018-11-09 DIAGNOSIS — I10 Essential (primary) hypertension: Secondary | ICD-10-CM | POA: Diagnosis not present

## 2018-11-09 DIAGNOSIS — Z7901 Long term (current) use of anticoagulants: Secondary | ICD-10-CM | POA: Diagnosis not present

## 2018-11-09 DIAGNOSIS — I480 Paroxysmal atrial fibrillation: Secondary | ICD-10-CM | POA: Diagnosis not present

## 2018-11-09 MED ORDER — MINOXIDIL 10 MG PO TABS: 10.0000 mg | ORAL_TABLET | Freq: Two times a day (BID) | ORAL | 6 refills | Status: DC

## 2018-11-09 MED FILL — METOCLOPRAMIDE 5 MG TABLET: 5 | 30 days supply | Qty: 30 | Fill #5

## 2018-11-09 MED FILL — ATORVASTATIN 40 MG TABLET: 40 | 90 days supply | Qty: 90 | Fill #0

## 2018-11-09 MED FILL — GABAPENTIN 300 MG CAPSULE: 300 | 90 days supply | Qty: 270 | Fill #1

## 2018-11-09 MED FILL — MINOXIDIL 10 MG TABLET: 10 | 30 days supply | Qty: 60 | Fill #0

## 2018-11-09 NOTE — Telephone Encounter (Signed)
Follow up:     Patient wife calling back and would like to speak with some one concering her husband medications. Please call patient back.

## 2018-11-09 NOTE — Telephone Encounter (Signed)
Spoke to patient's daughter at about 3pm,she requested a call to her mother to go over her father's medication list.  Called mother but she was busy at the time and requested a call back at 4:30. Called back at 4:30 and medication list reviewed with patient's wife. She is giving the patient almost the same medications we have on our list. He has been to the Hospital twice and also to GI. Changes recorded on printed list will be given to Dr. Charlett Blake tomorrow at patient's wife request for review. He has appointment on 11-24-2018.

## 2018-11-09 NOTE — Telephone Encounter (Signed)
Patient's daughter is calling again because she has not gotten a response about his medication.  Please call to discuss as soon as possible at [phone]

## 2018-11-09 NOTE — Telephone Encounter (Signed)
Spoke with patient and confirmed that minoxidil has been sent to [place]. She is very upset about patient's hospital stay and the things that were lost while he was there. I offered the number of patient experience but she already has it and has spoken with them and intends to call them back.  Adv I would let Dr. Harrington Challenger know as well as she asked.

## 2018-11-09 NOTE — Telephone Encounter (Signed)
Call from [facility], pharmacist at [facility] outpatient pharmacy. Reports pt's wife came into pharmacy and requests refill of minoxidil 10 mg because his dose was increased on discharge from hospital on 11/05/18 to 10 mg twice daily and the patient does not have any more.  I reviewed discharge instructions.  Med change was made but medicine was not sent anywhere.  Refill sent of minoxidil with directions 10 mg BID.

## 2018-11-09 NOTE — Telephone Encounter (Signed)
This has been done.  See previous phone note

## 2018-11-09 NOTE — Telephone Encounter (Signed)
Pt recently discharged from hospital    Needs Rx for minoxidil 10 mg bid     Please call in to pharmacy

## 2018-11-10 NOTE — Telephone Encounter (Signed)
Follow up with patient's wife this morning to let her know, per Dr. Charlett Blake, ok to continue same management as long as bp is normal.  Advised systolic should be 741 SE395 and diastolic should be 60 to 90, also pulse should be between 60 and 90.  She reports he is getting this checked every other day and yesterday it was 130/66.  They will keep upcoming appointment.

## 2018-11-14 MED FILL — traMADol HCL 50 MG TABS: 50 | 5 days supply | Qty: 40 | Fill #0

## 2018-11-15 DIAGNOSIS — E1140 Type 2 diabetes mellitus with diabetic neuropathy, unspecified: Secondary | ICD-10-CM | POA: Diagnosis not present

## 2018-11-15 DIAGNOSIS — I10 Essential (primary) hypertension: Secondary | ICD-10-CM | POA: Diagnosis not present

## 2018-11-15 DIAGNOSIS — Z7901 Long term (current) use of anticoagulants: Secondary | ICD-10-CM | POA: Diagnosis not present

## 2018-11-15 DIAGNOSIS — S72002D Fracture of unspecified part of neck of left femur, subsequent encounter for closed fracture with routine healing: Secondary | ICD-10-CM | POA: Diagnosis not present

## 2018-11-15 DIAGNOSIS — Z96642 Presence of left artificial hip joint: Secondary | ICD-10-CM | POA: Diagnosis not present

## 2018-11-15 DIAGNOSIS — R2689 Other abnormalities of gait and mobility: Secondary | ICD-10-CM | POA: Diagnosis not present

## 2018-11-15 DIAGNOSIS — I480 Paroxysmal atrial fibrillation: Secondary | ICD-10-CM | POA: Diagnosis not present

## 2018-11-16 ENCOUNTER — Telehealth: Payer: Self-pay | Admitting: *Deleted

## 2018-11-16 NOTE — Telephone Encounter (Signed)
Spoke with Nicki from [employer] and she stated that the wife and daughter is upset with them about how many OT and PT visits that were approved.  [employer] approved 3 OT and 7 PT visits.  The wife stated that she spoke with [employer] and they stated that 7 OT and 8 PT was approved.  Also Nicki stated that someone here in our office stated that [facility] should handle getting that changed.  I advised her that I did not see any note in chart about that.  She stated that if patient wanted more visits then they would have to call insurance and get an appeal.

## 2018-11-16 NOTE — Telephone Encounter (Signed)
Copied from [place] [id number]. Topic: General - Other >> Nov 15, 2018  1:18 PM Rainey Pines A wrote: Sherron Flemings from [facility] would like a callback from Dr. Rhae Lerner nurse at [phone].

## 2018-11-16 NOTE — Telephone Encounter (Signed)
Spoke with wife and advised her what was told to [place] by [facility].  I advised her that normally she would talk with someone different than whom  [facility] speaks with and that there are a couple of options that she can try.  Advised her that she can asked whomever she speaks with at [employer] and have them call [facility] directly or she can just see how the OT and PT may work, maybe after the 6 PT he may not need anymore sessions or after the 3rd session of OT see what the therapist thinks, does he need more sessions?  If more sessions are needed then [facility] will let us know and they will see about possibly getting more approved.  Wife was really appreciative of the call.

## 2018-11-17 NOTE — Telephone Encounter (Signed)
Wife called insurance to have them call [facility] and they advised her that they do not call [facility] but that the Pts Dr needs to cal [facility]/ please advise

## 2018-11-18 NOTE — Telephone Encounter (Signed)
Left detailed message on machine that she will have to wait til the last visit at least for occupational therapy to see if he actually needs more OT.  The occupational therapist will usually send a message to Dr. Charlett Blake after letting insurance know that he needs more therapy.  Even if we were to call they will only do what is approved by then insurance.

## 2018-11-21 ENCOUNTER — Telehealth: Payer: Self-pay | Admitting: Family Medicine

## 2018-11-21 NOTE — Telephone Encounter (Signed)
Josline with [employer] is calling from the Appeals and Grievance Deptarment is calling on behalf of the patient. Patient is interested in more OT and PT. Wife feels that the patient needs more OT ad PT. Patient is also interested in a taller walker. Both items need authorization from the PCP.  CB- [phone]

## 2018-11-21 NOTE — Telephone Encounter (Signed)
[name] in the Appeals and [name] is calling on behalf of the patient. Patient is interested in more OT and PT.

## 2018-11-22 NOTE — Telephone Encounter (Signed)
Left detailed message for Joseline giving verbal orders, also left fax number in case she needs to fax over forms for pcp to complete

## 2018-11-23 ENCOUNTER — Encounter: Payer: PPO | Admitting: Physical Medicine & Rehabilitation

## 2018-11-23 DIAGNOSIS — Z471 Aftercare following joint replacement surgery: Secondary | ICD-10-CM | POA: Diagnosis not present

## 2018-11-23 DIAGNOSIS — Z96642 Presence of left artificial hip joint: Secondary | ICD-10-CM | POA: Diagnosis not present

## 2018-11-24 ENCOUNTER — Ambulatory Visit (INDEPENDENT_AMBULATORY_CARE_PROVIDER_SITE_OTHER): Payer: PPO | Admitting: Family Medicine

## 2018-11-24 ENCOUNTER — Other Ambulatory Visit: Payer: Self-pay

## 2018-11-24 VITALS — BP 116/42 | HR 73 | Temp 97.8°F | Resp 18 | Wt 157.0 lb

## 2018-11-24 DIAGNOSIS — I5032 Chronic diastolic (congestive) heart failure: Secondary | ICD-10-CM | POA: Diagnosis not present

## 2018-11-24 DIAGNOSIS — J45909 Unspecified asthma, uncomplicated: Secondary | ICD-10-CM | POA: Diagnosis not present

## 2018-11-24 DIAGNOSIS — E1165 Type 2 diabetes mellitus with hyperglycemia: Secondary | ICD-10-CM

## 2018-11-24 DIAGNOSIS — I10 Essential (primary) hypertension: Secondary | ICD-10-CM | POA: Diagnosis not present

## 2018-11-24 DIAGNOSIS — E559 Vitamin D deficiency, unspecified: Secondary | ICD-10-CM | POA: Diagnosis not present

## 2018-11-24 DIAGNOSIS — D649 Anemia, unspecified: Secondary | ICD-10-CM

## 2018-11-24 DIAGNOSIS — E782 Mixed hyperlipidemia: Secondary | ICD-10-CM | POA: Diagnosis not present

## 2018-11-24 DIAGNOSIS — Z0000 Encounter for general adult medical examination without abnormal findings: Secondary | ICD-10-CM | POA: Diagnosis not present

## 2018-11-24 DIAGNOSIS — S72002A Fracture of unspecified part of neck of left femur, initial encounter for closed fracture: Secondary | ICD-10-CM

## 2018-11-24 DIAGNOSIS — R739 Hyperglycemia, unspecified: Secondary | ICD-10-CM

## 2018-11-24 LAB — CBC
HCT: 32.8 % — ABNORMAL LOW (ref 39.0–52.0)
Hemoglobin: 11 g/dL — ABNORMAL LOW (ref 13.0–17.0)
MCHC: 33.7 g/dL (ref 30.0–36.0)
MCV: 89.8 fl (ref 78.0–100.0)
Platelets: 344 10*3/uL (ref 150.0–400.0)
RBC: 3.65 Mil/uL — ABNORMAL LOW (ref 4.22–5.81)
RDW: 15.7 % — ABNORMAL HIGH (ref 11.5–15.5)
WBC: 7.9 10*3/uL (ref 4.0–10.5)

## 2018-11-24 LAB — COMPREHENSIVE METABOLIC PANEL
ALT: 4 U/L (ref 0–53)
AST: 9 U/L (ref 0–37)
Albumin: 3.6 g/dL (ref 3.5–5.2)
Alkaline Phosphatase: 60 U/L (ref 39–117)
BUN: 35 mg/dL — ABNORMAL HIGH (ref 6–23)
CO2: 27 mEq/L (ref 19–32)
Calcium: 8.9 mg/dL (ref 8.4–10.5)
Chloride: 109 mEq/L (ref 96–112)
Creatinine, Ser: 1.13 mg/dL (ref 0.40–1.50)
GFR: 61.35 mL/min (ref >60.00)
Glucose, Bld: 149 mg/dL — ABNORMAL HIGH (ref 70–99)
Potassium: 4.7 mEq/L (ref 3.5–5.1)
Sodium: 143 mEq/L (ref 135–145)
Total Bilirubin: 0.4 mg/dL (ref 0.2–1.2)
Total Protein: 6 g/dL (ref 6.0–8.3)

## 2018-11-24 LAB — LIPID PANEL
Cholesterol: 98 mg/dL (ref 0–200)
HDL: 36.4 mg/dL — ABNORMAL LOW (ref >39.00)
LDL Cholesterol: 40 mg/dL (ref 0–99)
NonHDL: 61.73
Total CHOL/HDL Ratio: 3
Triglycerides: 108 mg/dL (ref 0.0–149.0)
VLDL: 21.6 mg/dL (ref 0.0–40.0)

## 2018-11-24 LAB — HEMOGLOBIN A1C: Hgb A1c MFr Bld: 5.5 % (ref 4.6–6.5)

## 2018-11-24 LAB — FERRITIN: Ferritin: 47.6 ng/mL (ref 22.0–322.0)

## 2018-11-24 LAB — TSH: TSH: 2.33 u[IU]/mL (ref 0.35–4.50)

## 2018-11-24 MED ORDER — TRAMADOL HCL 50 MG PO TABS: 50.0000 mg | ORAL_TABLET | Freq: Three times a day (TID) | ORAL | 0 refills | Status: DC | PRN

## 2018-11-24 MED ORDER — CLONIDINE HCL 0.1 MG PO TABS: 0.1000 mg | ORAL_TABLET | Freq: Three times a day (TID) | ORAL | 3 refills | Status: DC

## 2018-11-24 MED ORDER — FERROUS SULFATE 325 (65 FE) MG PO TABS: 325.0000 mg | ORAL_TABLET | Freq: Three times a day (TID) | ORAL | 2 refills | Status: DC

## 2018-11-24 MED FILL — CloNIDine HCL 0.1 MG TAB: 0.1 | 30 days supply | Qty: 90 | Fill #0

## 2018-11-24 NOTE — Patient Instructions (Signed)
Preventive Care 75 Years and Older, Male Preventive care refers to lifestyle choices and visits with your health care provider that can promote health and wellness. This includes:  A yearly physical exam. This is also called an annual well check.  Regular dental and eye exams.  Immunizations.  Screening for certain conditions.  Healthy lifestyle choices, such as diet and exercise. What can I expect for my preventive care visit? Physical exam Your health care provider will check:  Height and weight. These may be used to calculate body mass index (BMI), which is a measurement that tells if you are at a healthy weight.  Heart rate and blood pressure.  Your skin for abnormal spots. Counseling Your health care provider may ask you questions about:  Alcohol, tobacco, and drug use.  Emotional well-being.  Home and relationship well-being.  Sexual activity.  Eating habits.  History of falls.  Memory and ability to understand (cognition).  Work and work Statistician. What immunizations do I need?  Influenza (flu) vaccine  This is recommended every year. Tetanus, diphtheria, and pertussis (Tdap) vaccine  You may need a Td booster every 10 years. Varicella (chickenpox) vaccine  You may need this vaccine if you have not already been vaccinated. Zoster (shingles) vaccine  You may need this after age 50. Pneumococcal conjugate (PCV13) vaccine  One dose is recommended after age 24. Pneumococcal polysaccharide (PPSV23) vaccine  One dose is recommended after age 33. Measles, mumps, and rubella (MMR) vaccine  You may need at least one dose of MMR if you were born in 1957 or later. You may also need a second dose. Meningococcal conjugate (MenACWY) vaccine  You may need this if you have certain conditions. Hepatitis A vaccine  You may need this if you have certain conditions or if you travel or work in places where you may be exposed to hepatitis A. Hepatitis B vaccine   You may need this if you have certain conditions or if you travel or work in places where you may be exposed to hepatitis B. Haemophilus influenzae type b (Hib) vaccine  You may need this if you have certain conditions. You may receive vaccines as individual doses or as more than one vaccine together in one shot (combination vaccines). Talk with your health care provider about the risks and benefits of combination vaccines. What tests do I need? Blood tests  Lipid and cholesterol levels. These may be checked every 5 years, or more frequently depending on your overall health.  Hepatitis C test.  Hepatitis B test. Screening  Lung cancer screening. You may have this screening every year starting at age 74 if you have a 30-pack-year history of smoking and currently smoke or have quit within the past 15 years.  Colorectal cancer screening. All adults should have this screening starting at age 57 and continuing until age 54. Your health care provider may recommend screening at age 47 if you are at increased risk. You will have tests every 1-10 years, depending on your results and the type of screening test.  Prostate cancer screening. Recommendations will vary depending on your family history and other risks.  Diabetes screening. This is done by checking your blood sugar (glucose) after you have not eaten for a while (fasting). You may have this done every 1-3 years.  Abdominal aortic aneurysm (AAA) screening. You may need this if you are a current or former smoker.  Sexually transmitted disease (STD) testing. Follow these instructions at home: Eating and drinking  Eat  a diet that includes fresh fruits and vegetables, whole grains, lean protein, and low-fat dairy products. Limit your intake of foods with high amounts of sugar, saturated fats, and salt.  Take vitamin and mineral supplements as recommended by your health care provider.  Do not drink alcohol if your health care provider  tells you not to drink.  If you drink alcohol: ? Limit how much you have to 0-2 drinks a day. ? Be aware of how much alcohol is in your drink. In the [place] one drink equals one 12 oz bottle of beer (355 mL), one 5 oz glass of wine (148 mL), or one 1 oz glass of hard liquor (44 mL). Lifestyle  Take daily care of your teeth and gums.  Stay active. Exercise for at least 30 minutes on 5 or more days each week.  Do not use any products that contain nicotine or tobacco, such as cigarettes, e-cigarettes, and chewing tobacco. If you need help quitting, ask your health care provider.  If you are sexually active, practice safe sex. Use a condom or other form of protection to prevent STIs (sexually transmitted infections).  Talk with your health care provider about taking a low-dose aspirin or statin. What's next?  Visit your health care provider once a year for a well check visit.  Ask your health care provider how often you should have your eyes and teeth checked.  Stay up to date on all vaccines. This information is not intended to replace advice given to you by your health care provider. Make sure you discuss any questions you have with your health care provider. Document Released: 01/25/2015 Document Revised: 12/23/2017 Document Reviewed: 12/23/2017 Elsevier Patient Education  2020 Elsevier Inc.  

## 2018-11-25 ENCOUNTER — Telehealth: Payer: Self-pay | Admitting: Family Medicine

## 2018-11-25 NOTE — Telephone Encounter (Signed)
Home Health Verbal Orders - Caller/Agency: Daneen Schick  Callback Number: [phone] VM okay  Requesting OT/PT/Skilled Nursing/Social Work/Speech Therapy: Home Health PT Frequency:  2 week 1 1 week 1 2 week 2   Also would 11/22/2018 at 11:00 pm, pt fell in the kitchen on his right side. Was seen by orthopedic doctor the following and reported okay.

## 2018-11-27 NOTE — Progress Notes (Signed)
Subjective:    Patient ID: [name], male    DOB: 14-Sep-1929, 85 y.o.   MRN: [id number]  No chief complaint on file.   HPI Patient is in today for annual preventative exam and follow up on chronic medical concerns including hypertension, diabetes, congestive heart failure and more. No recent febrile illness or hospitalizations. She hs been maintaining quarantine well since returning home. He is slowly healing after suffering a fracture of his hip last month. They note he has been struggling with fatigue and some memory concerns. Is not eating great but his intake is improving. Denies CP/palp/SOB/HA/congestion/fevers/GI or GU c/o. Taking meds as prescribed  Past Medical History:  Diagnosis Date  . Acute bronchitis 06/10/2015  . Allergic rhinitis   . Anemia   . Asthma   . Carotid stenosis    a. s/p Left CEA 2002;  b. Carotid US 3/16:  patent R CEA, L < 40%  . Chronic pain syndrome    Left shoulder, back  . Diverticulosis   . Dyslipidemia   . Epigastric pain 04/05/2016  . Esophageal stricture    Distal, benign  . GERD (gastroesophageal reflux disease)   . Hemorrhoids   . HTN (hypertension)    Negative renal duplex 07-22-11  . Hx of echocardiogram    a. Echo 10/11: mod LVH, EF 55-60%  . Pneumonia 07-2013  . Prostate cancer ([place]) 2000   Seed XRT  . Spondylosis, cervical, with myelopathy 11/26/2015  . 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 APPENDECTOMY  1953  . CAROTID ENDARTERECTOMY Right 01/2000  . CATARACT EXTRACTION Bilateral   . CERVICAL DISC SURGERY  8/07  . HIP ARTHROPLASTY Left 10/24/2018   Procedure: ARTHROPLASTY BIPOLAR HIP (HEMIARTHROPLASTY);  Surgeon: Paralee Cancel, MD;  Location: [facility];  Service: Orthopedics;  Laterality: Left;  . INSERTION PROSTATE RADIATION SEED  2000  . [place] SURGERY  1990s  . NASAL SINUS SURGERY     x 3    Family History  Problem Relation Age of Onset   . Stroke Brother   . Stroke Sister   . Stroke Mother   . Hyperlipidemia Mother   . Hypertension Mother   . Lung disease Father   . Diabetes Son   . Gout Son   . Obesity Sister   . Alcohol abuse Brother   . Cancer Brother        lung, smoker    Social History   Socioeconomic History  . Marital status: Married    Spouse name: Not on file  . Number of children: 1  . Years of education: 41  . Highest education level: Not on file  Occupational History  . Occupation: Retired  Scientific laboratory technician  . Financial resource strain: Not on file  . Food insecurity    Worry: Not on file    Inability: Not on file  . Transportation needs    Medical: Not on file    Non-medical: Not on file  Tobacco Use  . Smoking status: Former Smoker    Packs/day: 0.25    Years: 35.00    Pack years: 8.75    Types: Cigarettes    Quit date: 01/12/1978    Years since quitting: 40.9  . Smokeless tobacco: Current User    Types: Chew  Substance and Sexual Activity  . Alcohol use: No    Comment: Prior alcoholic, sober since 7893  .  Drug use: No  . Sexual activity: Not Currently  Lifestyle  . Physical activity    Days per week: Not on file    Minutes per session: Not on file  . Stress: Not on file  Relationships  . Social Herbalist on phone: Not on file    Gets together: Not on file    Attends religious service: Not on file    Active member of club or organization: Not on file    Attends meetings of clubs or organizations: Not on file    Relationship status: Not on file  . Intimate partner violence    Fear of current or ex partner: Not on file    Emotionally abused: Not on file    Physically abused: Not on file    Forced sexual activity: Not on file  Other Topics Concern  . Not on file  Social History Narrative   Lives at home w/ his wife   Right-handed    Caffeine: drinks a lot of coffee per report    Outpatient Medications Prior to Visit  Medication Sig Dispense Refill  .  acetaminophen (TYLENOL) 500 MG tablet Take 500 mg by mouth every 6 (six) hours as needed for mild pain or headache.    . albuterol (PROVENTIL HFA;VENTOLIN HFA) 108 (90 Base) MCG/ACT inhaler Inhale 1-2 puffs into the lungs every 6 (six) hours as needed for wheezing or shortness of breath. 1 Inhaler 6  . amLODipine (NORVASC) 5 MG tablet Take 2 tablets (10 mg total) by mouth daily. 180 tablet 3  . apixaban (ELIQUIS) 5 MG TABS tablet Take 1 tablet (5 mg total) by mouth 2 (two) times daily. 180 tablet 3  . atorvastatin (LIPITOR) 40 MG tablet TAKE 1 TABLET BY MOUTH DAILY. (Patient taking differently: Take 40 mg by mouth daily. ) 90 tablet 0  . fluticasone (FLOVENT HFA) 110 MCG/ACT inhaler Inhale 2 puffs into the lungs 2 (two) times daily. (Patient not taking: Reported on 10/24/2018) 1 Inhaler 0  . lisinopril (ZESTRIL) 2.5 MG tablet Take 1 tablet (2.5 mg total) by mouth daily. 30 tablet 0  . Menthol-Methyl Salicylate (MUSCLE RUB) 10-15 % CREA Apply 1 application topically 3 (three) times daily with meals. To BLE 85 g 0  . metoprolol succinate (TOPROL XL) 25 MG 24 hr tablet Take 0.5 tablets (12.5 mg total) by mouth daily.    . minoxidil (LONITEN) 10 MG tablet Take 1 tablet (10 mg total) by mouth 2 (two) times daily. 60 tablet 6  . pantoprazole (PROTONIX) 40 MG tablet TAKE 1 TABLET (40 MG TOTAL) BY MOUTH DAILY BEFORE BREAKFAST. 90 tablet 0  . polyethylene glycol (MIRALAX / GLYCOLAX) 17 g packet Take 17 g by mouth 2 (two) times daily. 28 packet 0  . polyvinyl alcohol (ARTIFICIAL TEARS) 1.4 % ophthalmic solution Place 1 drop into both eyes as needed for dry eyes.     [name] Kitchen triamcinolone cream (KENALOG) 0.5 % Apply 1 application topically daily as needed (for skin irritation).     . cloNIDine (CATAPRES) 0.2 MG tablet Take 1 tablet (0.2 mg total) by mouth every 8 (eight) hours. 90 tablet 0  . ferrous sulfate (FERROUSUL) 325 (65 FE) MG tablet Take 1 tablet (325 mg total) by mouth 3 (three) times daily with meals for 14  days. 42 tablet 0  . traMADol (ULTRAM) 50 MG tablet Take 1 tablet (50 mg total) by mouth every 6 (six) hours as needed. 40 tablet 0   No  facility-administered medications prior to visit.     Allergies  Allergen Reactions  . Hctz [Hydrochlorothiazide] Shortness Of Breath, Swelling and Other (See Comments)    "Swelling and dyspnea"   . Hydralazine Shortness Of Breath and Other (See Comments)    Chest pain and GI issues also  . Diovan [Valsartan] Other (See Comments)    Elevated potassium- Hyperkalemia  . Metformin And Related Other (See Comments)    Reaction not recalled  . Nsaids Other (See Comments)    Other than Tylenol, he isn't to have these because he's on Eliquis  . Other Other (See Comments)    Unnamed topically-applied B/P patch = Caused redness  . Prednisone Other (See Comments)    Suicidal thoughts  . Spironolactone Swelling    Site of swelling not recalled  . Codeine Itching and Nausea Only  . Oxycodone-Acetaminophen Itching and Nausea Only    Review of Systems  Constitutional: Positive for malaise/fatigue. Negative for chills and fever.  HENT: Negative for congestion and hearing loss.   Eyes: Negative for discharge.  Respiratory: Negative for cough, sputum production and shortness of breath.   Cardiovascular: Negative for chest pain, palpitations and leg swelling.  Gastrointestinal: Negative for abdominal pain, blood in stool, constipation, diarrhea, heartburn, nausea and vomiting.  Genitourinary: Negative for dysuria, frequency, hematuria and urgency.  Musculoskeletal: Positive for joint pain. Negative for back pain, falls and myalgias.  Skin: Negative for rash.  Neurological: Negative for dizziness, sensory change, loss of consciousness, weakness and headaches.  Endo/Heme/Allergies: Negative for environmental allergies. Does not bruise/bleed easily.  Psychiatric/Behavioral: Positive for memory loss. Negative for depression and suicidal ideas. The patient is not  nervous/anxious and does not have insomnia.        Objective:    Physical Exam Vitals signs and nursing note reviewed.  Constitutional:      General: He is not in acute distress.    Appearance: Normal appearance. He is well-developed. He is not ill-appearing.  HENT:     Head: Normocephalic and atraumatic.     Right Ear: Tympanic membrane and external ear normal.     Left Ear: Tympanic membrane and external ear normal.     Nose: Nose normal.  Eyes:     General:        Right eye: No discharge.        Left eye: No discharge.     Extraocular Movements: Extraocular movements intact.     Pupils: Pupils are equal, round, and reactive to light.  Neck:     Musculoskeletal: Normal range of motion and neck supple.  Cardiovascular:     Rate and Rhythm: Normal rate. Rhythm irregular.  Pulmonary:     Effort: Pulmonary effort is normal. No respiratory distress.     Breath sounds: Normal breath sounds. No wheezing.  Abdominal:     General: Bowel sounds are normal.     Palpations: Abdomen is soft.     Tenderness: There is no abdominal tenderness.  Skin:    General: Skin is warm and dry.  Neurological:     General: No focal deficit present.     Mental Status: He is alert and oriented to person, place, and time.     Coordination: Coordination normal.     Deep Tendon Reflexes: Reflexes normal.  Psychiatric:        Mood and Affect: Mood normal.        Behavior: Behavior normal.     BP (!) 116/42 (BP Location: Left Arm,  Patient Position: Sitting, Cuff Size: Normal)   Pulse 73   Temp 97.8 F (36.6 C) (Temporal)   Resp 18   Wt 157 lb (71.2 kg)   SpO2 96%   BMI 21.90 kg/m  Wt Readings from Last 3 Encounters:  11/24/18 157 lb (71.2 kg)  11/05/18 168 lb 14 oz (76.6 kg)  10/23/18 165 lb (74.8 kg)    Diabetic Foot Exam - Simple   No data filed     Lab Results  Component Value Date   WBC 7.9 11/24/2018   HGB 11.0 (L) 11/24/2018   HCT 32.8 (L) 11/24/2018   PLT 344.0 11/24/2018    GLUCOSE 149 (H) 11/24/2018   CHOL 98 11/24/2018   TRIG 108.0 11/24/2018   HDL 36.40 (L) 11/24/2018   LDLCALC 40 11/24/2018   ALT 4 11/24/2018   AST 9 11/24/2018   NA 143 11/24/2018   K 4.7 11/24/2018   CL 109 11/24/2018   CREATININE 1.13 11/24/2018   BUN 35 (H) 11/24/2018   CO2 27 11/24/2018   TSH 2.33 11/24/2018   PSA 0.00 Repeated and verified X2. (L) 09/22/2017   INR 1.3 (H) 10/23/2018   HGBA1C 5.5 11/24/2018    Lab Results  Component Value Date   TSH 2.33 11/24/2018   Lab Results  Component Value Date   WBC 7.9 11/24/2018   HGB 11.0 (L) 11/24/2018   HCT 32.8 (L) 11/24/2018   MCV 89.8 11/24/2018   PLT 344.0 11/24/2018   Lab Results  Component Value Date   NA 143 11/24/2018   K 4.7 11/24/2018   CO2 27 11/24/2018   GLUCOSE 149 (H) 11/24/2018   BUN 35 (H) 11/24/2018   CREATININE 1.13 11/24/2018   BILITOT 0.4 11/24/2018   ALKPHOS 60 11/24/2018   AST 9 11/24/2018   ALT 4 11/24/2018   PROT 6.0 11/24/2018   ALBUMIN 3.6 11/24/2018   CALCIUM 8.9 11/24/2018   ANIONGAP 7 11/05/2018   GFR 61.35 11/24/2018   Lab Results  Component Value Date   CHOL 98 11/24/2018   Lab Results  Component Value Date   HDL 36.40 (L) 11/24/2018   Lab Results  Component Value Date   LDLCALC 40 11/24/2018   Lab Results  Component Value Date   TRIG 108.0 11/24/2018   Lab Results  Component Value Date   CHOLHDL 3 11/24/2018   Lab Results  Component Value Date   HGBA1C 5.5 11/24/2018       Assessment & Plan:   Problem List Items Addressed This Visit    Vitamin D deficiency   HLD (hyperlipidemia)    Tolerating statin, encouraged heart healthy diet, avoid trans fats, minimize simple carbs and saturated fats. Increase exercise as tolerated      Relevant Medications   cloNIDine (CATAPRES) 0.1 MG tablet   Other Relevant Orders   Lipid panel (Completed)   Anemia   Relevant Medications   ferrous sulfate (FERROUSUL) 325 (65 FE) MG tablet   Other Relevant Orders   CBC  (Completed)   Ferritin (Completed)   HYPERTENSION, BENIGN ESSENTIAL - Primary    Well controlled, no changes to meds. Encouraged heart healthy diet such as the DASH diet and exercise as tolerated.       Relevant Medications   cloNIDine (CATAPRES) 0.1 MG tablet   Other Relevant Orders   CBC (Completed)   CMP (Completed)   TSH (Completed)   Asthma   RESOLVED: Hyperglycemia    hgba1c acceptable, minimize simple carbs. Increase exercise  as tolerated.       Preventative health care    Patient encouraged to maintain heart healthy diet, regular exercise, adequate sleep. Consider daily probiotics. Take medications as prescribed. He has aged out of colonoscopy      Hip fracture, left ([place])    He had surgical correction a month ago and he is doing well and slowly regaining his strength.       Essential hypertension   Relevant Medications   cloNIDine (CATAPRES) 0.1 MG tablet   Chronic diastolic congestive heart failure (HCC)    No recent exacerbation      Relevant Medications   cloNIDine (CATAPRES) 0.1 MG tablet   Controlled type 2 diabetes mellitus with hyperglycemia, without long-term current use of insulin (HCC)    hgba1c acceptable, minimize simple carbs. Increase exercise as tolerated. Continue current meds      Relevant Orders   A1C (Completed)      I have discontinued Elyn Aquas. Duerson's cloNIDine. I have also changed his traMADol. Additionally, I am having him start on cloNIDine. Lastly, I am having him maintain his triamcinolone cream, albuterol, polyvinyl alcohol, apixaban, fluticasone, pantoprazole, atorvastatin, acetaminophen, polyethylene glycol, metoprolol succinate, amLODipine, lisinopril, Muscle Rub, minoxidil, and ferrous sulfate.  Meds ordered this encounter  Medications  . cloNIDine (CATAPRES) 0.1 MG tablet    Sig: Take 1 tablet (0.1 mg total) by mouth 3 (three) times daily.    Dispense:  90 tablet    Refill:  3  . ferrous sulfate (FERROUSUL) 325 (65 FE) MG  tablet    Sig: Take 1 tablet (325 mg total) by mouth 3 (three) times daily with meals for 14 days.    Dispense:  90 tablet    Refill:  2  . traMADol (ULTRAM) 50 MG tablet    Sig: Take 1 tablet (50 mg total) by mouth 3 (three) times daily as needed for moderate pain.    Dispense:  90 tablet    Refill:  0     Penni Homans, MD

## 2018-11-27 NOTE — Assessment & Plan Note (Signed)
Tolerating statin, encouraged heart healthy diet, avoid trans fats, minimize simple carbs and saturated fats. Increase exercise as tolerated 

## 2018-11-27 NOTE — Assessment & Plan Note (Signed)
No recent exacerbation 

## 2018-11-27 NOTE — Assessment & Plan Note (Signed)
Well controlled, no changes to meds. Encouraged heart healthy diet such as the DASH diet and exercise as tolerated.  °

## 2018-11-27 NOTE — Assessment & Plan Note (Signed)
Patient encouraged to maintain heart healthy diet, regular exercise, adequate sleep. Consider daily probiotics. Take medications as prescribed. He has aged out of colonoscopy

## 2018-11-27 NOTE — Assessment & Plan Note (Signed)
He had surgical correction a month ago and he is doing well and slowly regaining his strength.

## 2018-11-27 NOTE — Assessment & Plan Note (Signed)
hgba1c acceptable, minimize simple carbs. Increase exercise as tolerated.  

## 2018-11-27 NOTE — Assessment & Plan Note (Signed)
hgba1c acceptable, minimize simple carbs. Increase exercise as tolerated. Continue current meds 

## 2018-11-29 ENCOUNTER — Other Ambulatory Visit: Payer: Self-pay | Admitting: Family Medicine

## 2018-11-29 MED ORDER — TRAMADOL HCL 50 MG PO TABS: 50.0000 mg | ORAL_TABLET | Freq: Three times a day (TID) | ORAL | 0 refills | Status: DC | PRN

## 2018-11-29 MED FILL — traMADol HCL 50 MG TABS: 50 | 30 days supply | Qty: 90 | Fill #0

## 2018-11-29 NOTE — Telephone Encounter (Signed)
Requested medication (s) are due for refill today: yes  Requested medication (s) are on the active medication list: yes  Last refill:  11/24/2018  Future visit scheduled: no  Notes to clinic:  Patient didn't receive script   Requested Prescriptions  Pending Prescriptions Disp Refills   traMADol (ULTRAM) 50 MG tablet 90 tablet 0    Sig: Take 1 tablet (50 mg total) by mouth 3 (three) times daily as needed for moderate pain.     Not Delegated - Analgesics:  Opioid Agonists Failed - 11/29/2018  1:09 PM      Failed - This refill cannot be delegated      Passed - Urine Drug Screen completed in last 360 days.      Passed - Valid encounter within last 6 months    Recent Outpatient Visits          5 days ago Preventative health care   [facility], [place]   3 months ago Skin infection   [facility] [place], [place]   4 months ago Skin infection   [facility] [place], [place]   9 months ago Neck pain   [facility], [place], [place]   9 months ago Fever, unspecified fever cause   [facility] [place], [place]      Future Appointments            In 2 weeks Meredith Staggers, MD [facility], [facility]   In 3 weeks Fay Records, MD [facility], [facility]

## 2018-11-29 NOTE — Telephone Encounter (Signed)
pls look at the requested verbal order by [facility], 11/13 did not get response, pt had a fall and this needs to have FU to [facility] at [place] ASAP Leave message on voice mail

## 2018-11-29 NOTE — Telephone Encounter (Signed)
Verbal orders given  

## 2018-11-29 NOTE — Telephone Encounter (Signed)
Medication Refill - Medication: traMADol (ULTRAM) 50 MG tablet   Has the patient contacted their pharmacy? No. (Agent: If no, request that the patient contact the pharmacy for the refill.) (Agent: If yes, when and what did the pharmacy advise?)  Preferred Pharmacy (with phone number or street name):  [place], [place] - [address] [phone] (Phone) [phone] (Fax)     Agent: Please be advised that RX refills may take up to 3 business days. We ask that you follow-up with your pharmacy.

## 2018-11-30 ENCOUNTER — Other Ambulatory Visit: Payer: Self-pay | Admitting: Internal Medicine

## 2018-11-30 MED FILL — METOPROLOL SUCCINATE ER 25: 25 | 90 days supply | Qty: 45 | Fill #3

## 2018-11-30 MED FILL — ELIQUIS 5 MG TABLET: 5 | 30 days supply | Qty: 60 | Fill #4

## 2018-12-05 ENCOUNTER — Inpatient Hospital Stay: Payer: PPO | Admitting: Physical Medicine and Rehabilitation

## 2018-12-07 ENCOUNTER — Other Ambulatory Visit: Payer: Self-pay | Admitting: Physical Medicine and Rehabilitation

## 2018-12-07 ENCOUNTER — Other Ambulatory Visit: Payer: Self-pay | Admitting: Internal Medicine

## 2018-12-07 ENCOUNTER — Other Ambulatory Visit: Payer: Self-pay | Admitting: Family Medicine

## 2018-12-07 NOTE — Telephone Encounter (Signed)
Forwarding medication refill request to PCP for review. 

## 2018-12-07 NOTE — Telephone Encounter (Signed)
Medication Refill - Medication: lisinopril (ZESTRIL) 2.5 MG tablet    Preferred Pharmacy (with phone number or street name):  [place], [place] - [address] [phone] (Phone) [phone] (Fax)     Agent: Please be advised that RX refills may take up to 3 business days. We ask that you follow-up with your pharmacy.

## 2018-12-07 NOTE — Telephone Encounter (Signed)
Please advise Sir, thank you. 

## 2018-12-09 MED FILL — METOCLOPRAMIDE 5 MG TABLET: 5 | 30 days supply | Qty: 30 | Fill #0

## 2018-12-11 MED ORDER — LISINOPRIL 2.5 MG PO TABS: 2.5000 mg | ORAL_TABLET | Freq: Every day | ORAL | 0 refills | Status: DC

## 2018-12-12 MED FILL — LISINOPRIL 2.5 MG TABLET: 2.5 | 30 days supply | Qty: 30 | Fill #0

## 2018-12-14 ENCOUNTER — Other Ambulatory Visit: Payer: Self-pay

## 2018-12-14 ENCOUNTER — Encounter: Payer: Self-pay | Admitting: Physical Medicine & Rehabilitation

## 2018-12-14 ENCOUNTER — Encounter: Payer: PPO | Attending: Physical Medicine and Rehabilitation | Admitting: Physical Medicine & Rehabilitation

## 2018-12-14 ENCOUNTER — Encounter

## 2018-12-14 VITALS — BP 127/53 | HR 65 | Temp 97.9°F | Ht 71.0 in | Wt 160.0 lb

## 2018-12-14 DIAGNOSIS — S72002S Fracture of unspecified part of neck of left femur, sequela: Secondary | ICD-10-CM | POA: Insufficient documentation

## 2018-12-14 NOTE — Progress Notes (Signed)
Subjective:    Patient ID: [name], male    DOB: December 01, 1929, 85 y.o.   MRN: [id number]  HPI   [name] is here in follow up of his left hip fx and subsequent left hip hemi/inpatient rehab.  I had seen him years ago after his stroke.  He comes back today with complaints of ongoing left hip pain as well as pain radiating down from the leg to the foot.  He has had increased back pain over this time as well.  Dr. Ihor Gully performed his hip surgery.  He has seen Dr. Herma Mering I believe in the past for the back although it has been a while.  He is using his rolling walker for balance in and around the house although often he walks without it.  He does state that he lost his balance not too long ago and fell although he did not suffer any injury as a result of this.  I asked him if he is having any numbness in his leg and foot he states that it is ongoing as related to the stroke from before.  Pain in the left leg seems to be worse if he is up on the leg for a prolonged period of time.  He can affect sleep as well.  He is on gabapentin which was resumed per his home regimen.  He is only taking 300 mg twice a day.  He does complain of some generalized fatigue  Pain Inventory Average Pain 3 Pain Right Now 6 My pain is aching  In the last 24 hours, has pain interfered with the following? General activity 0 Relation with others 0 Enjoyment of life 0 What TIME of day is your pain at its worst? . Sleep (in general) Good  Pain is worse with: walking and standing Pain improves with: rest Relief from Meds: .  Mobility use a walker transfers alone  Function I need assistance with the following:  bathing and household duties  Neuro/Psych tremor  Prior Studies Any changes since last visit?  no  Physicians involved in your care Any changes since last visit?  no   Family History  Problem Relation Age of Onset  . Stroke Brother   . Stroke Sister   . Stroke Mother   . Hyperlipidemia  Mother   . Hypertension Mother   . Lung disease Father   . Diabetes Son   . Gout Son   . Obesity Sister   . Alcohol abuse Brother   . Cancer Brother        lung, smoker   Social History   Socioeconomic History  . Marital status: Married    Spouse name: Not on file  . Number of children: 1  . Years of education: 59  . Highest education level: Not on file  Occupational History  . Occupation: Retired  Scientific laboratory technician  . Financial resource strain: Not on file  . Food insecurity    Worry: Not on file    Inability: Not on file  . Transportation needs    Medical: Not on file    Non-medical: Not on file  Tobacco Use  . Smoking status: Former Smoker    Packs/day: 0.25    Years: 35.00    Pack years: 8.75    Types: Cigarettes    Quit date: 01/12/1978    Years since quitting: 40.9  . Smokeless tobacco: Current User    Types: Chew  Substance and Sexual Activity  . Alcohol use:  No    Comment: Prior alcoholic, sober since 2423  . Drug use: No  . Sexual activity: Not Currently  Lifestyle  . Physical activity    Days per week: Not on file    Minutes per session: Not on file  . Stress: Not on file  Relationships  . Social Herbalist on phone: Not on file    Gets together: Not on file    Attends religious service: Not on file    Active member of club or organization: Not on file    Attends meetings of clubs or organizations: Not on file    Relationship status: Not on file  Other Topics Concern  . Not on file  Social History Narrative   Lives at home w/ his wife   Right-handed    Caffeine: drinks a lot of coffee per report   Past Surgical History:  Procedure Laterality Date  . APPENDECTOMY  1953  . CAROTID ENDARTERECTOMY Right 01/2000  . CATARACT EXTRACTION Bilateral   . CERVICAL DISC SURGERY  8/07  . HIP ARTHROPLASTY Left 10/24/2018   Procedure: ARTHROPLASTY BIPOLAR HIP (HEMIARTHROPLASTY);  Surgeon: Paralee Cancel, MD;  Location: [facility];  Service: Orthopedics;   Laterality: Left;  . INSERTION PROSTATE RADIATION SEED  2000  . [place] SURGERY  1990s  . NASAL SINUS SURGERY     x 3   Past Medical History:  Diagnosis Date  . Acute bronchitis 06/10/2015  . Allergic rhinitis   . Anemia   . Asthma   . Carotid stenosis    a. s/p Left CEA 2002;  b. Carotid US 3/16:  patent R CEA, L < 40%  . Chronic pain syndrome    Left shoulder, back  . Diverticulosis   . Dyslipidemia   . Epigastric pain 04/05/2016  . Esophageal stricture    Distal, benign  . GERD (gastroesophageal reflux disease)   . Hemorrhoids   . HTN (hypertension)    Negative renal duplex 07-22-11  . Hx of echocardiogram    a. Echo 10/11: mod LVH, EF 55-60%  . Pneumonia 07-2013  . Prostate cancer ([place]) 2000   Seed XRT  . Spondylosis, cervical, with myelopathy 11/26/2015  . 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  Opioid Risk Score:   Fall Risk Score:  `1  Depression screen PHQ 2/9  Depression screen [facility] 2/9 07/01/2018 12/01/2016 12/10/2014  Decreased Interest 0 0 0  Down, Depressed, Hopeless 0 0 0  PHQ - 2 Score 0 0 0  Some recent data might be hidden     Review of Systems  Constitutional: Negative.   HENT: Negative.   Eyes: Negative.   Respiratory: Negative.   Cardiovascular: Positive for leg swelling.  Gastrointestinal: Positive for constipation.  Endocrine: Negative.   Genitourinary: Negative.   Musculoskeletal: Positive for arthralgias and gait problem.  Skin: Negative.   Allergic/Immunologic: Negative.   Neurological: Positive for tremors.  Hematological: Negative.   Psychiatric/Behavioral: Negative.   All other systems reviewed and are negative.      Objective:   Physical Exam Gen: no distress, normal appearing HEENT: oral mucosa pink and moist, NCAT Cardio: Reg rate Chest: normal effort, normal rate of breathing Abd: soft, non-distended Ext: no edema Skin: intact Neuro: MILD LEFT  hemiparesis, and ? Hemi-sensory loss. Slow wide based gait. Extra time for turns. Walked without walker. DTR's 2+ LLE Musculoskeletal: left knee can  be extended to -25 degrees. Heel cord tight too. Walks with antalgia LLE Psych: pleasant, normal affect        Assessment & Plan:  1.Functional and mobility deficitssecondary to left FNF and subsequent left hip hemi              -continue with [facility]  - would like transition to outpt PT at [facility] to address gait and range of motion of the left lower extremity.  2. Left leg pain, suspicious for low back/lumbar radiculopathy  -increase gabapentin 300mg  TID--observe closely for sedation  -continue with left lower ext stretches especially hamstring  -Outpatient therapy referral as above  -May want to go back and follow-up with Dr. Herma Mering in this regard as apparently he has had injections in assessment in the past 3. Hx of right CVA with residual left hemiparesis   Fifteen minutes of face to face patient care time were spent during this visit. All questions were encouraged and answered.  Follow up with me in 2 mos .

## 2018-12-14 NOTE — Patient Instructions (Addendum)
LET ME KNOW WHEN THERAPY IS WINDING DOWN AND I CAN MAKE A REFERRAL TO OUTPATIENT REHAB AT [facility].   Increase gabapentin to 300mg  three x daily.

## 2018-12-15 DIAGNOSIS — I480 Paroxysmal atrial fibrillation: Secondary | ICD-10-CM | POA: Diagnosis not present

## 2018-12-15 DIAGNOSIS — E1140 Type 2 diabetes mellitus with diabetic neuropathy, unspecified: Secondary | ICD-10-CM | POA: Diagnosis not present

## 2018-12-15 DIAGNOSIS — Z96642 Presence of left artificial hip joint: Secondary | ICD-10-CM | POA: Diagnosis not present

## 2018-12-15 DIAGNOSIS — I10 Essential (primary) hypertension: Secondary | ICD-10-CM | POA: Diagnosis not present

## 2018-12-15 DIAGNOSIS — Z7901 Long term (current) use of anticoagulants: Secondary | ICD-10-CM | POA: Diagnosis not present

## 2018-12-15 DIAGNOSIS — R2689 Other abnormalities of gait and mobility: Secondary | ICD-10-CM | POA: Diagnosis not present

## 2018-12-15 DIAGNOSIS — S72002D Fracture of unspecified part of neck of left femur, subsequent encounter for closed fracture with routine healing: Secondary | ICD-10-CM | POA: Diagnosis not present

## 2018-12-15 MED FILL — MINOXIDIL 10 MG TABLET: 10 | 30 days supply | Qty: 60 | Fill #1

## 2018-12-21 ENCOUNTER — Telehealth: Payer: Self-pay | Admitting: Internal Medicine

## 2018-12-21 ENCOUNTER — Telehealth: Payer: Self-pay | Admitting: Family Medicine

## 2018-12-21 MED FILL — AMLODIPINE BESYLATE 5 MG TA: 5 | 90 days supply | Qty: 180 | Fill #2

## 2018-12-21 NOTE — Telephone Encounter (Signed)
I spoke with pt. He needs assistance due to recent broken hip. His wife will come with him to appointment.

## 2018-12-21 NOTE — Telephone Encounter (Signed)
Pts wife called and is requesting to know if pt is still needing to take lipitor. Please advise .

## 2018-12-21 NOTE — Telephone Encounter (Signed)
New message  Patient's wife states that she will be coming to office visit patient is broke his hip. Please advise.

## 2018-12-22 NOTE — Telephone Encounter (Signed)
Called patient and spoke with patients spouse she voiced her understanding

## 2018-12-23 ENCOUNTER — Encounter: Payer: Self-pay | Admitting: Internal Medicine

## 2018-12-23 ENCOUNTER — Ambulatory Visit (INDEPENDENT_AMBULATORY_CARE_PROVIDER_SITE_OTHER): Payer: PPO | Admitting: Internal Medicine

## 2018-12-23 ENCOUNTER — Other Ambulatory Visit: Payer: Self-pay

## 2018-12-23 VITALS — BP 126/44 | HR 67 | Ht 71.0 in | Wt 157.0 lb

## 2018-12-23 DIAGNOSIS — R0602 Shortness of breath: Secondary | ICD-10-CM | POA: Diagnosis not present

## 2018-12-23 DIAGNOSIS — R062 Wheezing: Secondary | ICD-10-CM

## 2018-12-23 DIAGNOSIS — I10 Essential (primary) hypertension: Secondary | ICD-10-CM

## 2018-12-23 MED ORDER — CLONIDINE HCL 0.1 MG PO TABS: 0.1000 mg | ORAL_TABLET | Freq: Two times a day (BID) | ORAL | 3 refills | Status: DC

## 2018-12-23 NOTE — Progress Notes (Signed)
Cardiology Office Note   Date:  12/23/2018   ID:  [name], DOB 1929/09/17, MRN [id number]  PCP:  [name], MD  Cardiologist:   [name], MD      History of Present Illness: [name] is a 85 y.o. male with a history of HTN, CV dz (s/p L CEA 2002), HL, GERD, reactive airway dz, esophageal stricture, and PAF    I last saw him in [place] in May 2020  He was seen by [name] in Aug 2020  At that time echo was scheduled  Lasix and K started for edema   Echo showed noraml LVEF and RVEF  [facility]   He went on to have rehab as well   The pt says his breathing is OK   Denies CP   No palpitations.  No edema     Current Meds  Medication Sig  . acetaminophen (TYLENOL) 500 MG tablet Take 500 mg by mouth every 6 (six) hours as needed for mild pain or headache.  . albuterol (PROVENTIL HFA;VENTOLIN HFA) 108 (90 Base) MCG/ACT inhaler Inhale 1-2 puffs into the lungs every 6 (six) hours as needed for wheezing or shortness of breath.  [name] Kitchen amLODipine (NORVASC) 5 MG tablet Take 2 tablets (10 mg total) by mouth daily.  [name] Kitchen apixaban (ELIQUIS) 5 MG TABS tablet Take 1 tablet (5 mg total) by mouth 2 (two) times daily.  [name] Kitchen atorvastatin (LIPITOR) 40 MG tablet TAKE 1 TABLET BY MOUTH DAILY. (Patient taking differently: Take 40 mg by mouth daily. )  . cloNIDine (CATAPRES) 0.1 MG tablet Take 1 tablet (0.1 mg total) by mouth 3 (three) times daily.  . fluticasone (FLOVENT HFA) 110 MCG/ACT inhaler Inhale 2 puffs into the lungs 2 (two) times daily.  [name] Kitchen gabapentin (NEURONTIN) 300 MG capsule Take 300 mg by mouth 3 (three) times daily.  [name] Kitchen lisinopril (ZESTRIL) 2.5 MG tablet Take 1 tablet (2.5 mg total) by mouth daily.  . Menthol-Methyl Salicylate (MUSCLE RUB) 10-15 % CREA Apply 1 application topically 3 (three) times daily with meals. To BLE  . metoCLOPramide (REGLAN) 5 MG tablet TAKE 1 TABLET BY MOUTH AT BEDTIME  . metoprolol succinate (TOPROL XL) 25 MG 24  hr tablet Take 0.5 tablets (12.5 mg total) by mouth daily.  . minoxidil (LONITEN) 10 MG tablet Take 1 tablet (10 mg total) by mouth 2 (two) times daily.  . pantoprazole (PROTONIX) 40 MG tablet TAKE 1 TABLET (40 MG TOTAL) BY MOUTH DAILY BEFORE BREAKFAST.  [name] Kitchen polyethylene glycol (MIRALAX / GLYCOLAX) 17 g packet Take 17 g by mouth 2 (two) times daily.  . polyvinyl alcohol (ARTIFICIAL TEARS) 1.4 % ophthalmic solution Place 1 drop into both eyes as needed for dry eyes.   . traMADol (ULTRAM) 50 MG tablet Take 1 tablet (50 mg total) by mouth 3 (three) times daily as needed for moderate pain.  [name] Kitchen triamcinolone cream (KENALOG) 0.5 % Apply 1 application topically daily as needed (for skin irritation).      Allergies:   Hctz [hydrochlorothiazide], Hydralazine, Diovan [valsartan], Metformin and related, Nsaids, Other, Prednisone, Spironolactone, Codeine, and Oxycodone-acetaminophen   Past Medical History:  Diagnosis Date  . Acute bronchitis 06/10/2015  . Allergic rhinitis   . Anemia   . Asthma   . Carotid stenosis    a. s/p Left CEA 2002;  b. Carotid US 3/16:  patent R CEA, L < 40%  . Chronic pain syndrome  Left shoulder, back  . Diverticulosis   . Dyslipidemia   . Epigastric pain 04/05/2016  . Esophageal stricture    Distal, benign  . GERD (gastroesophageal reflux disease)   . Hemorrhoids   . HTN (hypertension)    Negative renal duplex 07-22-11  . Hx of echocardiogram    a. Echo 10/11: mod LVH, EF 55-60%  . Pneumonia 07-2013  . Prostate cancer ([place]) 2000   Seed XRT  . Spondylosis, cervical, with myelopathy 11/26/2015  . 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 APPENDECTOMY  1953  . CAROTID ENDARTERECTOMY Right 01/2000  . CATARACT EXTRACTION Bilateral   . CERVICAL DISC SURGERY  8/07  . HIP ARTHROPLASTY Left 10/24/2018   Procedure: ARTHROPLASTY BIPOLAR HIP (HEMIARTHROPLASTY);  Surgeon: Paralee Cancel, MD;   Location: [place];  Service: Orthopedics;  Laterality: Left;  . INSERTION PROSTATE RADIATION SEED  2000  . [place] SURGERY  1990s  . NASAL SINUS SURGERY     x 3     Social History:  The patient  reports that he quit smoking about 40 years ago. His smoking use included cigarettes. He has a 8.75 pack-year smoking history. His smokeless tobacco use includes chew. He reports that he does not drink alcohol or use drugs.   Family History:  The patient's family history includes Alcohol abuse in his brother; Cancer in his brother; Diabetes in his son; Gout in his son; Hyperlipidemia in his mother; Hypertension in his mother; Lung disease in his father; Obesity in his sister; Stroke in his brother, mother, and sister.    ROS:  Please see the history of present illness. All other systems are reviewed and  Negative to the above problem except as noted.    PHYSICAL EXAM: VS:  BP (!) 126/44   Pulse 67   Ht 5\' 11"  (1.803 m)   Wt 157 lb (71.2 kg)   SpO2 97%   BMI 21.90 kg/m   GEN: Well nourished, well developed, in no acute distress  HEENT: normal  Neck: no JVD, carotid bruits, or masses Cardiac: RRR; no murmurs, rubs, or gallops,no edema  Respiratory:  Mild diffuse wheezes   No rales GI: soft, nontender, nondistended, + BS  No hepatomegaly  MS: no deformity Moving all extremities   Skin: warm and dry, no rash Neuro:  Strength and sensation are intact Psych: euthymic mood, full affect   EKG:  EKG is not ordered today.   Lipid Panel    Component Value Date/Time   CHOL 98 11/24/2018 1417   TRIG 108.0 11/24/2018 1417   HDL 36.40 (L) 11/24/2018 1417   CHOLHDL 3 11/24/2018 1417   VLDL 21.6 11/24/2018 1417   LDLCALC 40 11/24/2018 1417      Wt Readings from Last 3 Encounters:  12/23/18 157 lb (71.2 kg)  12/14/18 160 lb (72.6 kg)  11/24/18 157 lb (71.2 kg)      ASSESSMENT AND PLAN: 1  HTN   BP is better on current regimen   At home BP readings 110s to 150 /   Keep on same  regimen  2  PAF  Continue Eliuqis  3  Reactive airway dz.   Pt with mild wheezing   He had been outside today  Has inhalers    Will check BNPand BMET   4  CV dz   Keep on statin  5   hL   Good  control on current regimen   LDL 40   F/U in 3 months   Current medicines are reviewed at length with the patient today.  The patient does not have concerns regarding medicines.  Signed, [name], MD  12/23/2018 4:26 PM    [address], [place], [place]  [address] Phone: [phone]; Fax: [phone]

## 2018-12-23 NOTE — Patient Instructions (Signed)
Medication Instructions:  Your physician has recommended you make the following change in your medication:  1.) decrease clonidine to twice a day from three times  *If you need a refill on your cardiac medications before your next appointment, please call your pharmacy*  Lab Work: Today: cbc, bmet, bnp If you have labs (blood work) drawn today and your tests are completely normal, you will receive your results only by: [name] Kitchen MyChart Message (if you have MyChart) OR . A paper copy in the mail If you have any lab test that is abnormal or we need to change your treatment, we will call you to review the results.  Testing/Procedures: none  Follow-Up: At [facility], you and your health needs are our priority.  As part of our continuing mission to provide you with exceptional heart care, we have created designated Provider Care Teams.  These Care Teams include your primary Cardiologist (physician) and Advanced Practice Providers (APPs -  Physician Assistants and Nurse Practitioners) who all work together to provide you with the care you need, when you need it.  Your next appointment:   3 month(s)  The format for your next appointment:   Either In Person or Virtual  Provider:   You may see Dr. [name] or one of the following Advanced Practice Providers on your designated Care Team:    Richardson Dopp, PA-C  [place], [place]  Daune Perch, NP   Other Instructions

## 2018-12-24 LAB — BASIC METABOLIC PANEL
BUN/Creatinine Ratio: 17 (ref 10–24)
BUN: 16 mg/dL (ref 8–27)
CO2: 22 mmol/L (ref 20–29)
Calcium: 8.7 mg/dL (ref 8.6–10.2)
Chloride: 106 mmol/L (ref 96–106)
Creatinine, Ser: 0.94 mg/dL (ref 0.76–1.27)
GFR calc Af Amer: 84 mL/min/{1.73_m2} (ref >59)
GFR calc non Af Amer: 73 mL/min/{1.73_m2} (ref >59)
Glucose: 89 mg/dL (ref 65–99)
Potassium: 4.8 mmol/L (ref 3.5–5.2)
Sodium: 142 mmol/L (ref 134–144)

## 2018-12-24 LAB — CBC
Hematocrit: 32.8 % — ABNORMAL LOW (ref 37.5–51.0)
Hemoglobin: 11 g/dL — ABNORMAL LOW (ref 13.0–17.7)
MCH: 29.6 pg (ref 26.6–33.0)
MCHC: 33.5 g/dL (ref 31.5–35.7)
MCV: 88 fL (ref 79–97)
Platelets: 271 10*3/uL (ref 150–450)
RBC: 3.71 x10E6/uL — ABNORMAL LOW (ref 4.14–5.80)
RDW: 14.4 % (ref 11.6–15.4)
WBC: 7.3 10*3/uL (ref 3.4–10.8)

## 2018-12-24 LAB — PRO B NATRIURETIC PEPTIDE: NT-Pro BNP: 671 pg/mL — ABNORMAL HIGH (ref 0–486)

## 2018-12-26 ENCOUNTER — Other Ambulatory Visit: Payer: Self-pay | Admitting: Family Medicine

## 2018-12-26 NOTE — Telephone Encounter (Signed)
Requesting:tramadol Contract:yes UDS:n/a Last OV:11/24/18 Next OV:n/a Last Refill:11/29/18  #[id number] Database:   Please advise

## 2018-12-27 MED FILL — traMADol HCL 50 MG TABS: 50 | 30 days supply | Qty: 90 | Fill #0

## 2018-12-29 MED FILL — ELIQUIS 5 MG TABLET: 5 | 7 days supply | Qty: 14 | Fill #5

## 2019-01-02 ENCOUNTER — Encounter: Payer: Self-pay | Admitting: *Deleted

## 2019-01-05 ENCOUNTER — Other Ambulatory Visit: Payer: Self-pay | Admitting: Family Medicine

## 2019-01-05 MED FILL — METOCLOPRAMIDE 5 MG TABLET: 5 | 30 days supply | Qty: 30 | Fill #1

## 2019-01-05 MED FILL — LISINOPRIL 2.5 MG TABLET: 2.5 | 30 days supply | Qty: 30 | Fill #0

## 2019-01-14 ENCOUNTER — Other Ambulatory Visit: Payer: Self-pay | Admitting: Internal Medicine

## 2019-01-14 MED FILL — MINOXIDIL 10 MG TABLET: 10 | 30 days supply | Qty: 60 | Fill #2

## 2019-01-14 MED FILL — ELIQUIS 5 MG TABLET: 5 | 30 days supply | Qty: 60 | Fill #6

## 2019-01-16 ENCOUNTER — Telehealth: Payer: Self-pay | Admitting: Physical Medicine & Rehabilitation

## 2019-01-16 DIAGNOSIS — S72002S Fracture of unspecified part of neck of left femur, sequela: Secondary | ICD-10-CM

## 2019-01-16 MED FILL — PANTOPRAZOLE SOD DR 40 MG T: 40 | 90 days supply | Qty: 90 | Fill #0

## 2019-01-16 NOTE — Telephone Encounter (Signed)
Patient is needing referral for outpatient therapy.  They have finished up with Home Health.

## 2019-01-18 NOTE — Telephone Encounter (Signed)
Referral placed to [facility] and notified his EC.

## 2019-01-20 MED FILL — CloNIDine HCL 0.1 MG TAB: 0.1 | 30 days supply | Qty: 90 | Fill #1

## 2019-01-22 ENCOUNTER — Encounter: Payer: Self-pay | Admitting: Family Medicine

## 2019-01-23 ENCOUNTER — Telehealth: Payer: Self-pay | Admitting: *Deleted

## 2019-01-23 DIAGNOSIS — Z471 Aftercare following joint replacement surgery: Secondary | ICD-10-CM | POA: Diagnosis not present

## 2019-01-23 DIAGNOSIS — Z96642 Presence of left artificial hip joint: Secondary | ICD-10-CM | POA: Diagnosis not present

## 2019-01-23 NOTE — Telephone Encounter (Signed)
We do not place the orders for vaccine, please given him the contact numbers for him to arrange his vaccines which I do recommend. No preference between the two.

## 2019-01-23 NOTE — Telephone Encounter (Signed)
Copied from [place] [id number]. Topic: General - Call Back - No Documentation >> Jan 20, 2019  5:02 PM Erick Blinks wrote: Reason for CRM: Message for PCP, pt wants PCP to place orders to receive vaccine. For Wife as well please advise,  Best contact: [phone]  Advised to call/go online.

## 2019-01-23 NOTE — Telephone Encounter (Signed)
Any information to give patient about vaccine

## 2019-01-24 ENCOUNTER — Other Ambulatory Visit: Payer: Self-pay | Admitting: Family Medicine

## 2019-01-25 NOTE — Telephone Encounter (Signed)
Pt was called and they have set up appt to get COVID vaccine on 02/03/19.

## 2019-01-26 MED FILL — traMADol HCL 50 MG TABS: 50 | 30 days supply | Qty: 90 | Fill #0

## 2019-01-26 NOTE — Telephone Encounter (Signed)
Requesting:tramadol  Contract:yes UDS: Last OV:11/24/18 Next OV:n/a Last Refill:12/26/18 #[id number] Database:   Please advise

## 2019-02-05 ENCOUNTER — Ambulatory Visit: Payer: PPO | Attending: Internal Medicine

## 2019-02-05 DIAGNOSIS — Z23 Encounter for immunization: Secondary | ICD-10-CM

## 2019-02-05 NOTE — Progress Notes (Signed)
   Covid-19 Vaccination Clinic  Name:  [name]    MRN: [id number] DOB: October 09, 1929  02/05/2019  [name] was observed post Covid-19 immunization for 15 minutes without incidence. He was provided with Vaccine Information Sheet and instruction to access the V-Safe system.   [name] was instructed to call 911 with any severe reactions post vaccine: [name] Kitchen Difficulty breathing  . Swelling of your face and throat  . A fast heartbeat  . A bad rash all over your body  . Dizziness and weakness    Immunizations Administered    Name Date Dose VIS Date Route   Pfizer COVID-19 Vaccine 02/05/2019  1:05 PM 0.3 mL 12/23/2018 Intramuscular   Manufacturer: [place]   Lot: [id number]   [place]: [id number]

## 2019-02-06 ENCOUNTER — Other Ambulatory Visit: Payer: Self-pay | Admitting: Family Medicine

## 2019-02-06 ENCOUNTER — Ambulatory Visit: Payer: PPO | Admitting: Physical Therapy

## 2019-02-06 DIAGNOSIS — I10 Essential (primary) hypertension: Secondary | ICD-10-CM

## 2019-02-06 DIAGNOSIS — E559 Vitamin D deficiency, unspecified: Secondary | ICD-10-CM

## 2019-02-06 DIAGNOSIS — E782 Mixed hyperlipidemia: Secondary | ICD-10-CM

## 2019-02-06 DIAGNOSIS — I6350 Cerebral infarction due to unspecified occlusion or stenosis of unspecified cerebral artery: Secondary | ICD-10-CM

## 2019-02-06 MED FILL — ELIQUIS 5 MG TABLET: 5 | 30 days supply | Qty: 60 | Fill #7

## 2019-02-06 MED FILL — MINOXIDIL 10 MG TABLET: 10 | 30 days supply | Qty: 60 | Fill #3

## 2019-02-06 MED FILL — METOCLOPRAMIDE 5 MG TABLET: 5 | 30 days supply | Qty: 30 | Fill #2

## 2019-02-07 MED FILL — LISINOPRIL 2.5 MG TABLET: 2.5 | 90 days supply | Qty: 90 | Fill #0

## 2019-02-07 MED FILL — ATORVASTATIN 40 MG TABLET: 40 | 90 days supply | Qty: 90 | Fill #0

## 2019-02-15 ENCOUNTER — Ambulatory Visit: Payer: PPO | Admitting: Physical Medicine & Rehabilitation

## 2019-02-20 ENCOUNTER — Ambulatory Visit: Payer: PPO | Admitting: Physical Therapy

## 2019-02-23 ENCOUNTER — Other Ambulatory Visit: Payer: Self-pay | Admitting: Family Medicine

## 2019-02-24 NOTE — Telephone Encounter (Signed)
Last tramadol RX:  01/26/19, #90 x no refills. Last OV:  11/24/18   Next OV:  Due 05/24/19 but not yet scheduled UDS:  01/13/18, past due CSC:  04/20/17, past due

## 2019-02-27 ENCOUNTER — Ambulatory Visit: Payer: PPO | Attending: Internal Medicine

## 2019-02-27 ENCOUNTER — Telehealth: Payer: Self-pay

## 2019-02-27 DIAGNOSIS — Z23 Encounter for immunization: Secondary | ICD-10-CM | POA: Insufficient documentation

## 2019-02-27 MED FILL — traMADol HCL 50 MG TABS: 50 | 30 days supply | Qty: 90 | Fill #0

## 2019-02-27 NOTE — Telephone Encounter (Signed)
Patient called in to see if Dr. Charlett Blake can send in a prescription for  traMADol (ULTRAM) 50 MG tablet  To his pharmacy.    Thanks,

## 2019-02-27 NOTE — Progress Notes (Signed)
   Covid-19 Vaccination Clinic  Name:  [name]    MRN: [id number] DOB: 07-19-1929  02/27/2019  [name] was observed post Covid-19 immunization for 30 minutes based on pre-vaccination screening without incidence. He was provided with Vaccine Information Sheet and instruction to access the V-Safe system.   [name] was instructed to call 911 with any severe reactions post vaccine: [name] Kitchen Difficulty breathing  . Swelling of your face and throat  . A fast heartbeat  . A bad rash all over your body  . Dizziness and weakness    Immunizations Administered    Name Date Dose VIS Date Route   Pfizer COVID-19 Vaccine 02/27/2019  1:21 PM 0.3 mL 12/23/2018 Intramuscular   Manufacturer: [place]   Lot: [id number]   [place]: [id number]

## 2019-02-28 NOTE — Telephone Encounter (Signed)
Pt aware Rx sent to pharmacy/thx dmf

## 2019-03-06 NOTE — Telephone Encounter (Signed)
02/27/19 mychart message came back as unread. Mailed letter.

## 2019-03-07 ENCOUNTER — Encounter: Payer: Self-pay | Admitting: Physical Therapy

## 2019-03-07 ENCOUNTER — Ambulatory Visit: Payer: PPO | Attending: Physical Medicine & Rehabilitation | Admitting: Physical Therapy

## 2019-03-07 ENCOUNTER — Other Ambulatory Visit: Payer: Self-pay

## 2019-03-07 DIAGNOSIS — R296 Repeated falls: Secondary | ICD-10-CM | POA: Diagnosis not present

## 2019-03-07 DIAGNOSIS — M25552 Pain in left hip: Secondary | ICD-10-CM | POA: Diagnosis not present

## 2019-03-07 DIAGNOSIS — M25662 Stiffness of left knee, not elsewhere classified: Secondary | ICD-10-CM | POA: Diagnosis not present

## 2019-03-07 DIAGNOSIS — R262 Difficulty in walking, not elsewhere classified: Secondary | ICD-10-CM | POA: Insufficient documentation

## 2019-03-07 NOTE — Patient Instructions (Signed)
Access Code: [email or web address]  URL: [email or web address]  Date: 03/07/2019  Prepared by: Lum Babe   Exercises Seated Long Arc Quad - 10 reps - 1 sets - 2 hold - 4x daily - 7x weekly Seated March - 10 reps - 1 sets - 3 hold - 4x daily - 7x weekly Seated Heel Raise - 10 reps - 1 sets - 2 hold - 4x daily - 7x weekly Seated Ankle Dorsiflexion AROM - 10 reps - 1 sets - 2 hold - 3x daily - 7x weekly

## 2019-03-07 NOTE — Therapy (Signed)
[address] [place], [place], [address] Phone: [phone]   Fax:  [phone]  Physical Therapy Evaluation  Patient Details  Name: [name] MRN: [id number] Date of Birth: Oct 17, 1929 Referring Provider (PT): Kavin Leech   Encounter Date: 03/07/2019  PT End of Session - 03/07/19 1606    Visit Number  1    Date for PT Re-Evaluation  05/05/19    PT Start Time  0623    PT Stop Time  7628    PT Time Calculation (min)  41 min    Activity Tolerance  Patient limited by pain;Patient limited by fatigue    Behavior During Therapy  [facility] for tasks assessed/performed       Past Medical History:  Diagnosis Date  . Acute bronchitis 06/10/2015  . Allergic rhinitis   . Anemia   . Asthma   . Carotid stenosis    a. s/p Left CEA 2002;  b. Carotid US 3/16:  patent R CEA, L < 40%  . Chronic pain syndrome    Left shoulder, back  . Diverticulosis   . Dyslipidemia   . Epigastric pain 04/05/2016  . Esophageal stricture    Distal, benign  . GERD (gastroesophageal reflux disease)   . Hemorrhoids   . HTN (hypertension)    Negative renal duplex 07-22-11  . Hx of echocardiogram    a. Echo 10/11: mod LVH, EF 55-60%  . Pneumonia 07-2013  . Prostate cancer ([place]) 2000   Seed XRT  . Spondylosis, cervical, with myelopathy 11/26/2015  . Stroke [facility]) 8/01   right thalamic - on chronic Plavix/ASA    Past Surgical History:  Procedure Laterality Date  . APPENDECTOMY  1953  . CAROTID ENDARTERECTOMY Right 01/2000  . CATARACT EXTRACTION Bilateral   . CERVICAL DISC SURGERY  8/07  . HIP ARTHROPLASTY Left 10/24/2018   Procedure: ARTHROPLASTY BIPOLAR HIP (HEMIARTHROPLASTY);  Surgeon: Paralee Cancel, MD;  Location: [place];  Service: Orthopedics;  Laterality: Left;  . INSERTION PROSTATE RADIATION SEED  2000  . [place] SURGERY  1990s  . NASAL SINUS SURGERY     x 3    There were no vitals filed for this visit.   Subjective  Assessment - 03/07/19 1542    Subjective  Patient and wife report that he had a fall October 23, 2018, he sustained a fracture and underwent a left hemiarthroplasty.  Spent about 2 weeks in rehab in the hospital. Patient reports that he had home PT until Christmas.  He and his wife reports that he hsa still had significant pain in the hip and knee and is not walking well.    Limitations  Lifting;Standing;Walking;House hold activities    Patient Stated Goals  walk better, less pain, be stronger, no falls    Currently in Pain?  Yes    Pain Score  0-No pain    Pain Location  Hip   left knee   Pain Orientation  Left    Pain Descriptors / Indicators  Sharp;Sore    Pain Type  Acute pain    Pain Onset  More than a month ago    Pain Frequency  Intermittent    Aggravating Factors   walking and standing pain up to 7-8/10, any pivoting    Pain Relieving Factors  rest, not walking    Effect of Pain on Daily Activities  unsteady gait, difficulty dressing         [facility] PT  Assessment - 03/07/19 0001      Assessment   Medical Diagnosis  left hip pain, difficulty walking    Referring Provider (PT)  Z. Schwartz    Onset Date/Surgical Date  10/23/18    Prior Therapy  at the hospital and in the home      Precautions   Precautions  Posterior Hip      Balance Screen   Has the patient fallen in the past 6 months  Yes    How many times?  2    Has the patient had a decrease in activity level because of a fear of falling?   No    Is the patient reluctant to leave their home because of a fear of falling?   No      Home Environment   Additional Comments  4-5 steps into the home, does some hosuework, does yardwork      Prior Function   Level of Independence  Independent    Vocation  Retired    Leisure  no exercise      ROM / Strength   AROM / PROM / Strength  AROM;Strength      AROM   Overall AROM Comments  left hip flexion75 degrees, abduction 12 degrees    AROM Assessment Site  Knee     Right/Left Knee  Left    Left Knee Extension  20    Left Knee Flexion  98      Strength   Overall Strength Comments  left knee 4-/5 with pain in the left hip, left hips strength 4-/5 with pain in the anterior and lateral left hip      Palpation   Palpation comment  he c/o soreness in the left thigh and the left hip area      Transfers   Comments  really has to use his hands to stand up, is very slow, does not put much weight on the left leg      Ambulation/Gait   Gait Comments  uses a SPC, very slow, very antalgic on the left, he has a left knee flexion contracture, shuffles the left, a lot of times seems to get the left foot to stick and he lurches fwd      Standardized Balance Assessment   Standardized Balance Assessment  Timed Up and Go Test      Timed Up and Go Test   Normal TUG (seconds)  33    TUG Comments  SPC                Objective measurements completed on examination: See above findings.                PT Short Term Goals - 03/07/19 1611      PT SHORT TERM GOAL #1   Title  The patient will be indep with HEP for LE strengthening, flexibility, and standing balance activities.    Time  4    Period  Weeks        PT Long Term Goals - 03/07/19 1612      PT LONG TERM GOAL #1   Title  decrease left hip/knee pain 50%    Time  12    Period  Weeks    Status  New      PT LONG TERM GOAL #2   Title  increase Berg to 40/56 will need to perform Berg next visits    Time  12    Period  Weeks  Status  New      PT LONG TERM GOAL #3   Title  The patinet will decrease TUG  to < or equal to 18 seconds to demo decreased risk for falls.    Time  12    Period  Weeks    Status  New      PT LONG TERM GOAL #4   Title  The patient will negotiate community surfaces without a device independently (including grass, curbs, inclines) x 500 feet.    Time  12    Period  Weeks    Status  New      PT LONG TERM GOAL #5   Title  increase strength of the left  hip and knee to 4+/5    Time  12    Period  Weeks    Status  New             Plan - 03/07/19 1606    Clinical Impression Statement  Patient and wife got lost coming here, I saw them wandering around outside our window and we went to get them, they were tired after doing a lot of walking, he fell and sustained a left hip fracture 10/23/18, he underwent a hemiarthroplasty posterior approach.  He was in the hospital rehab for 2 weeks and then had some home PT until Christmas, they report not doing any exercises and he does report a second fall about a month ago without injury, he uses a SPC, very poor gait pattern, left knee flexion contracture, shuffling gait, unsteady, TUG was 33 seconds    Personal Factors and Comorbidities  Comorbidity 3+    Comorbidities  multiple comorbidities >5    Stability/Clinical Decision Making  Evolving/Moderate complexity    Clinical Decision Making  Moderate    Rehab Potential  Fair    PT Frequency  2x / week    PT Duration  12 weeks    PT Treatment/Interventions  ADLs/Self Care Home Management;Electrical Stimulation;Cryotherapy;Moist Heat;Stair training;Gait training;Functional mobility training;Therapeutic activities;Therapeutic exercise;Balance training;Manual techniques;Patient/family education    PT Next Visit Plan  start exercises, work on balance, knee ROM    Consulted and Agree with Plan of Care  Patient       Patient will benefit from skilled therapeutic intervention in order to improve the following deficits and impairments:  Abnormal gait, Cardiopulmonary status limiting activity, Decreased activity tolerance, Decreased endurance, Decreased range of motion, Decreased strength, Difficulty walking, Impaired flexibility, Decreased balance, Decreased mobility, Pain  Visit Diagnosis: Difficulty in walking, not elsewhere classified - Plan: PT plan of care cert/re-cert  Repeated falls - Plan: PT plan of care cert/re-cert  Pain in left hip - Plan: PT  plan of care cert/re-cert  Stiffness of left knee, not elsewhere classified - Plan: PT plan of care cert/re-cert     Problem List Patient Active Problem List   Diagnosis Date Noted  . Controlled type 2 diabetes mellitus with hyperglycemia, without long-term current use of insulin ([place])   . Labile blood glucose   . Postoperative pain   . Closed left hip fracture, sequela 10/29/2018  . Acute blood loss anemia   . AKI (acute kidney injury) ([place])   . Diabetic peripheral neuropathy ([place])   . Essential hypertension   . Chronic diastolic congestive heart failure ([place])   . History of left hip hemiarthroplasty 10/28/2018  . Wheezing on expiration   . Femur fracture, left ([facility]) 10/27/2018  . Hip fracture ([place]) 10/23/2018  . Hip fracture, left ([place])  10/23/2018  . Ankle pain 01/13/2018  . Neck pain 01/13/2018  . Constipation 01/11/2018  . Low back pain 01/11/2018  . Dysuria 12/06/2016  . Weakness 12/06/2016  . 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 Preventative health care 12/06/2016  . Epigastric pain 04/05/2016  . Spondylosis, cervical, with myelopathy 11/26/2015  . Cervicogenic headache 11/26/2015  . Umbilical hernia 40/76/8088  . Asthma 10/08/2011  . Carotid stenosis 03/31/2011  . Chronic pain   . Vitamin D deficiency 12/11/2008  . HLD (hyperlipidemia) 12/11/2008  . Anemia 12/11/2008  . Cerebral artery occlusion with cerebral infarction ([place]) 12/11/2008  . URINARY INCONTINENCE, MALE 12/11/2008  . Disorder of bursae and tendons in shoulder region 12/29/2007  . ANXIETY, MILD 08/09/2007  . HYPERTENSION, BENIGN ESSENTIAL 01/04/2007  . Allergic rhinitis 01/04/2007  . HEADACHE, TENSION 11/05/2006  . PROSTATE CANCER, HX OF 11/05/2006    Sumner Boast., PT 03/07/2019, 4:16 PM  [address] [place], [place], [address] Phone: [phone]   Fax:  [phone]  Name: AADITH RAUDENBUSH MRN:  [id number] Date of Birth: [birth year]

## 2019-03-09 ENCOUNTER — Other Ambulatory Visit: Payer: Self-pay

## 2019-03-09 ENCOUNTER — Ambulatory Visit: Payer: PPO

## 2019-03-09 DIAGNOSIS — R262 Difficulty in walking, not elsewhere classified: Secondary | ICD-10-CM | POA: Diagnosis not present

## 2019-03-09 DIAGNOSIS — M25662 Stiffness of left knee, not elsewhere classified: Secondary | ICD-10-CM

## 2019-03-09 DIAGNOSIS — R296 Repeated falls: Secondary | ICD-10-CM

## 2019-03-09 DIAGNOSIS — M25552 Pain in left hip: Secondary | ICD-10-CM

## 2019-03-09 MED FILL — MINOXIDIL 10 MG TABLET: 10 | 30 days supply | Qty: 60 | Fill #4

## 2019-03-09 MED FILL — ELIQUIS 5 MG TABLET: 5 | 30 days supply | Qty: 60 | Fill #8

## 2019-03-09 MED FILL — METOCLOPRAMIDE 5 MG TABLET: 5 | 30 days supply | Qty: 30 | Fill #3

## 2019-03-09 MED FILL — CloNIDine HCL 0.1 MG TAB: 0.1 | 30 days supply | Qty: 90 | Fill #2

## 2019-03-09 NOTE — Therapy (Signed)
[address] [place], [place], [address] Phone: [phone]   Fax:  [phone]  Physical Therapy Treatment  Patient Details  Name: [name] MRN: [id number] Date of Birth: 08-09-1929 Referring Provider (PT): Kavin Leech   Encounter Date: 03/09/2019  PT End of Session - 03/09/19 1538    Visit Number  2    Date for PT Re-Evaluation  05/05/19    PT Start Time  5916    PT Stop Time  1610    PT Time Calculation (min)  40 min    Activity Tolerance  Patient limited by pain;Patient limited by fatigue    Behavior During Therapy  [facility] for tasks assessed/performed       Past Medical History:  Diagnosis Date  . Acute bronchitis 06/10/2015  . Allergic rhinitis   . Anemia   . Asthma   . Carotid stenosis    a. s/p Left CEA 2002;  b. Carotid US 3/16:  patent R CEA, L < 40%  . Chronic pain syndrome    Left shoulder, back  . Diverticulosis   . Dyslipidemia   . Epigastric pain 04/05/2016  . Esophageal stricture    Distal, benign  . GERD (gastroesophageal reflux disease)   . Hemorrhoids   . HTN (hypertension)    Negative renal duplex 07-22-11  . Hx of echocardiogram    a. Echo 10/11: mod LVH, EF 55-60%  . Pneumonia 07-2013  . Prostate cancer ([place]) 2000   Seed XRT  . Spondylosis, cervical, with myelopathy 11/26/2015  . 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 APPENDECTOMY  1953  . CAROTID ENDARTERECTOMY Right 01/2000  . CATARACT EXTRACTION Bilateral   . CERVICAL DISC SURGERY  8/07  . HIP ARTHROPLASTY Left 10/24/2018   Procedure: ARTHROPLASTY BIPOLAR HIP (HEMIARTHROPLASTY);  Surgeon: Paralee Cancel, MD;  Location: [facility];  Service: Orthopedics;  Laterality: Left;  . INSERTION PROSTATE RADIATION SEED  2000  . [place] SURGERY  1990s  . NASAL SINUS SURGERY     x 3    There were no vitals filed for this visit.  Subjective  Assessment - 03/09/19 1536    Subjective  Pt. reporting he has been doing HEP daily.    Patient Stated Goals  walk better, less pain, be stronger, no falls    Currently in Pain?  Yes    Pain Score  2     Pain Location  Knee    Pain Orientation  Left;Medial    Pain Type  Acute pain    Multiple Pain Sites  No                       [facility] Adult PT Treatment/Exercise - 03/09/19 0001      Knee/Hip Exercises: Stretches   Passive Hamstring Stretch  Left;1 rep;30 seconds    Passive Hamstring Stretch Limitations  with heel prop sitting     Hip Flexor Stretch  Left;1 rep;30 seconds    Hip Flexor Stretch Limitations  mod thomas pos + strap    LE resting on 4" step     Knee/Hip Exercises: Aerobic   Nustep  Lvl 1, 6 min (LE only)       Knee/Hip Exercises: Seated   Long Arc Quad  Left;10 reps    Long [employer] Limitations  cues for [employer]  Other Seated Knee/Hip Exercises  Seated DF x 10     Other Seated Knee/Hip Exercises  Seated PF x 10 reps     Marching  Right;Left;10 reps;Strengthening    Hamstring Curl  Left;10 reps    Hamstring Limitations  red TB       Manual Therapy   Manual Therapy  Passive ROM;Soft tissue mobilization    Manual therapy comments  supine     Soft tissue mobilization  STM/strumming to L lateral thigh musculature     Passive ROM  Manual L ITB, HS, gentle glute, hip flexor stretch with therapist x 30 sec                PT Short Term Goals - 03/07/19 1611      PT SHORT TERM GOAL #1   Title  The patient will be indep with HEP for LE strengthening, flexibility, and standing balance activities.    Time  4    Period  Weeks        PT Long Term Goals - 03/09/19 1801      PT LONG TERM GOAL #1   Title  decrease left hip/knee pain 50%    Time  12    Period  Weeks    Status  On-going      PT LONG TERM GOAL #2   Title  increase Berg to 40/56 will need to perform Berg next visits    Time  12    Period  Weeks    Status  On-going      PT  LONG TERM GOAL #3   Title  The patinet will decrease TUG  to < or equal to 18 seconds to demo decreased risk for falls.    Time  12    Period  Weeks    Status  On-going      PT LONG TERM GOAL #4   Title  The patient will negotiate community surfaces without a device independently (including grass, curbs, inclines) x 500 feet.    Time  12    Period  Weeks    Status  On-going      PT LONG TERM GOAL #5   Title  increase strength of the left hip and knee to 4+/5    Time  12    Period  Weeks    Status  On-going            Plan - 03/09/19 1539    Clinical Impression Statement  Pt. doing well.  Very instable ambulating into clinic with [place] with 2 minor LOB able to self-correct with SPC.  Very low shuffling gait requiring close supervision throughout session.  Reviewed home program with pt. demonstrating understanding.  Tolerated all LE strengthening activities well in seated.  Ended visit with pt. pain free.    Comorbidities  multiple comorbidities >5    Rehab Potential  Fair    PT Treatment/Interventions  ADLs/Self Care Home Management;Electrical Stimulation;Cryotherapy;Moist Heat;Stair training;Gait training;Functional mobility training;Therapeutic activities;Therapeutic exercise;Balance training;Manual techniques;Patient/family education    PT Next Visit Plan  start exercises, work on balance, knee ROM    Consulted and Agree with Plan of Care  Patient       Patient will benefit from skilled therapeutic intervention in order to improve the following deficits and impairments:  Abnormal gait, Cardiopulmonary status limiting activity, Decreased activity tolerance, Decreased endurance, Decreased range of motion, Decreased strength, Difficulty walking, Impaired flexibility, Decreased balance, Decreased mobility, Pain  Visit Diagnosis: Difficulty in  walking, not elsewhere classified  Repeated falls  Pain in left hip  Stiffness of left knee, not elsewhere classified     Problem  List Patient Active Problem List   Diagnosis Date Noted  . Controlled type 2 diabetes mellitus with hyperglycemia, without long-term current use of insulin ([place])   . Labile blood glucose   . Postoperative pain   . Closed left hip fracture, sequela 10/29/2018  . Acute blood loss anemia   . AKI (acute kidney injury) ([place])   . Diabetic peripheral neuropathy ([place])   . Essential hypertension   . Chronic diastolic congestive heart failure ([place])   . History of left hip hemiarthroplasty 10/28/2018  . Wheezing on expiration   . Femur fracture, left ([place]) 10/27/2018  . Hip fracture ([place]) 10/23/2018  . Hip fracture, left ([place]) 10/23/2018  . Ankle pain 01/13/2018  . Neck pain 01/13/2018  . Constipation 01/11/2018  . Low back pain 01/11/2018  . Dysuria 12/06/2016  . Weakness 12/06/2016  . 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 Preventative health care 12/06/2016  . Epigastric pain 04/05/2016  . Spondylosis, cervical, with myelopathy 11/26/2015  . Cervicogenic headache 11/26/2015  . Umbilical hernia 44/96/7591  . Asthma 10/08/2011  . Carotid stenosis 03/31/2011  . Chronic pain   . Vitamin D deficiency 12/11/2008  . HLD (hyperlipidemia) 12/11/2008  . Anemia 12/11/2008  . Cerebral artery occlusion with cerebral infarction ([place]) 12/11/2008  . URINARY INCONTINENCE, MALE 12/11/2008  . Disorder of bursae and tendons in shoulder region 12/29/2007  . ANXIETY, MILD 08/09/2007  . HYPERTENSION, BENIGN ESSENTIAL 01/04/2007  . Allergic rhinitis 01/04/2007  . HEADACHE, TENSION 11/05/2006  . PROSTATE CANCER, HX OF 11/05/2006    Bess Harvest, PTA 03/09/19 6:07 PM   [address] [place], [place], [address] Phone: [phone]   Fax:  [phone]  Name: [name] MRN: [id number] Date of Birth: November 19, 1929

## 2019-03-14 ENCOUNTER — Other Ambulatory Visit: Payer: Self-pay

## 2019-03-14 ENCOUNTER — Ambulatory Visit: Payer: PPO | Attending: Physical Medicine & Rehabilitation

## 2019-03-14 DIAGNOSIS — M25552 Pain in left hip: Secondary | ICD-10-CM | POA: Diagnosis not present

## 2019-03-14 DIAGNOSIS — M25662 Stiffness of left knee, not elsewhere classified: Secondary | ICD-10-CM | POA: Insufficient documentation

## 2019-03-14 DIAGNOSIS — R296 Repeated falls: Secondary | ICD-10-CM | POA: Diagnosis not present

## 2019-03-14 DIAGNOSIS — R262 Difficulty in walking, not elsewhere classified: Secondary | ICD-10-CM

## 2019-03-14 NOTE — Therapy (Signed)
[address] [address] [place], [place], [address] Phone: [phone]   Fax:  [phone]  Physical Therapy Treatment  Patient Details  Name: [name] MRN: [id number] Date of Birth: 1929/08/24 Referring Provider (PT): Kavin Leech   Encounter Date: 03/14/2019  PT End of Session - 03/14/19 1550    Visit Number  3    Date for PT Re-Evaluation  05/05/19    PT Start Time  1330    PT Stop Time  1410    PT Time Calculation (min)  40 min    Activity Tolerance  Patient limited by fatigue    Behavior During Therapy  [facility] for tasks assessed/performed       Past Medical History:  Diagnosis Date  . Acute bronchitis 06/10/2015  . Allergic rhinitis   . Anemia   . Asthma   . Carotid stenosis    a. s/p Left CEA 2002;  b. Carotid US 3/16:  patent R CEA, L < 40%  . Chronic pain syndrome    Left shoulder, back  . Diverticulosis   . Dyslipidemia   . Epigastric pain 04/05/2016  . Esophageal stricture    Distal, benign  . GERD (gastroesophageal reflux disease)   . Hemorrhoids   . HTN (hypertension)    Negative renal duplex 07-22-11  . Hx of echocardiogram    a. Echo 10/11: mod LVH, EF 55-60%  . Pneumonia 07-2013  . Prostate cancer ([place]) 2000   Seed XRT  . Spondylosis, cervical, with myelopathy 11/26/2015  . 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 APPENDECTOMY  1953  . CAROTID ENDARTERECTOMY Right 01/2000  . CATARACT EXTRACTION Bilateral   . CERVICAL DISC SURGERY  8/07  . HIP ARTHROPLASTY Left 10/24/2018   Procedure: ARTHROPLASTY BIPOLAR HIP (HEMIARTHROPLASTY);  Surgeon: Paralee Cancel, MD;  Location: [facility];  Service: Orthopedics;  Laterality: Left;  . INSERTION PROSTATE RADIATION SEED  2000  . [place] SURGERY  1990s  . NASAL SINUS SURGERY     x 3    There were no vitals filed for this visit.  Subjective Assessment - 03/14/19 1547    Subjective  Pt. reporting he has been doing the HEP.    Patient Stated Goals  walk better, less pain, be stronger, no falls    Currently in Pain?  Yes    Pain Score  2     Pain Location  Knee    Pain Orientation  Left;Medial    Pain Descriptors / Indicators  Sharp;Sore    Pain Type  Acute pain    Pain Onset  More than a month ago    Pain Frequency  Intermittent    Multiple Pain Sites  No                       [facility] Adult PT Treatment/Exercise - 03/14/19 0001      Knee/Hip Exercises: Aerobic   Nustep  Lvl 2, 7 min (LE only)       Knee/Hip Exercises: Standing   Heel Raises  Both;10 reps;3 seconds    Heel Raises Limitations  heel/to raise     Hip Flexion  Left;Right;10 reps;Knee bent;Stengthening    Hip Flexion Limitations  Alternating at counter     Hip Abduction  Left;10 reps;Knee straight;Right;Stengthening    Abduction Limitations  counter  Hip Extension  Right;Left;10 reps;Knee straight;Stengthening    Extension Limitations  45 dg glute med kick back    counter       Manual therapy:  STM/DTM to L mid quad/hip flexors - palpable TPs noted  TPR to L mid quad, L hip flexors - improved tenderness noted afterwards  Manual stretching with therapist of L hip flexor in mod thomas pos 2 x 30 sec         PT Short Term Goals - 03/07/19 1611      PT SHORT TERM GOAL #1   Title  The patient will be indep with HEP for LE strengthening, flexibility, and standing balance activities.    Time  4    Period  Weeks        PT Long Term Goals - 03/09/19 1801      PT LONG TERM GOAL #1   Title  decrease left hip/knee pain 50%    Time  12    Period  Weeks    Status  On-going      PT LONG TERM GOAL #2   Title  increase Berg to 40/56 will need to perform Berg next visits    Time  12    Period  Weeks    Status  On-going      PT LONG TERM GOAL #3   Title  The patinet will decrease TUG  to < or equal to 18 seconds to demo decreased risk for falls.    Time  12     Period  Weeks    Status  On-going      PT LONG TERM GOAL #4   Title  The patient will negotiate community surfaces without a device independently (including grass, curbs, inclines) x 500 feet.    Time  12    Period  Weeks    Status  On-going      PT LONG TERM GOAL #5   Title  increase strength of the left hip and knee to 4+/5    Time  12    Period  Weeks    Status  On-going            Plan - 03/14/19 1552    Clinical Impression Statement  Pt. reporting benefit from MT last session.  Continued MT addressing increased tone/TPs in L mid quad and hip flexors with good relief noted following.  Duration of session focused on standing balance and LE strengthening.  Pt. pain free throughout session.   Comorbidities  multiple comorbidities >5    Rehab Potential  Fair    PT Treatment/Interventions  ADLs/Self Care Home Management;Electrical Stimulation;Cryotherapy;Moist Heat;Stair training;Gait training;Functional mobility training;Therapeutic activities;Therapeutic exercise;Balance training;Manual techniques;Patient/family education       Patient will benefit from skilled therapeutic intervention in order to improve the following deficits and impairments:  Abnormal gait, Cardiopulmonary status limiting activity, Decreased activity tolerance, Decreased endurance, Decreased range of motion, Decreased strength, Difficulty walking, Impaired flexibility, Decreased balance, Decreased mobility, Pain  Visit Diagnosis: Difficulty in walking, not elsewhere classified  Repeated falls  Pain in left hip  Stiffness of left knee, not elsewhere classified     Problem List Patient Active Problem List   Diagnosis Date Noted  . Controlled type 2 diabetes mellitus with hyperglycemia, without long-term current use of insulin ([place])   . Labile blood glucose   . Postoperative pain   . Closed left hip fracture, sequela 10/29/2018  . Acute blood loss anemia   . AKI (acute  kidney injury) ([place])   .  Diabetic peripheral neuropathy ([place])   . Essential hypertension   . Chronic diastolic congestive heart failure ([place])   . History of left hip hemiarthroplasty 10/28/2018  . Wheezing on expiration   . Femur fracture, left ([place]) 10/27/2018  . Hip fracture ([place]) 10/23/2018  . Hip fracture, left ([place]) 10/23/2018  . Ankle pain 01/13/2018  . Neck pain 01/13/2018  . Constipation 01/11/2018  . Low back pain 01/11/2018  . Dysuria 12/06/2016  . Weakness 12/06/2016  . 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 Preventative health care 12/06/2016  . Epigastric pain 04/05/2016  . Spondylosis, cervical, with myelopathy 11/26/2015  . Cervicogenic headache 11/26/2015  . Umbilical hernia 70/96/2836  . Asthma 10/08/2011  . Carotid stenosis 03/31/2011  . Chronic pain   . Vitamin D deficiency 12/11/2008  . HLD (hyperlipidemia) 12/11/2008  . Anemia 12/11/2008  . Cerebral artery occlusion with cerebral infarction ([place]) 12/11/2008  . URINARY INCONTINENCE, MALE 12/11/2008  . Disorder of bursae and tendons in shoulder region 12/29/2007  . ANXIETY, MILD 08/09/2007  . HYPERTENSION, BENIGN ESSENTIAL 01/04/2007  . Allergic rhinitis 01/04/2007  . HEADACHE, TENSION 11/05/2006  . PROSTATE CANCER, HX OF 11/05/2006    Bess Harvest, PTA 03/14/19 4:20 PM   [place] [address] [place], [place], [address] Phone: [phone]   Fax:  [phone]  Name: [name] MRN: [id number] Date of Birth: [birth year]

## 2019-03-16 ENCOUNTER — Other Ambulatory Visit: Payer: Self-pay

## 2019-03-16 ENCOUNTER — Ambulatory Visit: Payer: PPO | Admitting: Physical Therapy

## 2019-03-16 DIAGNOSIS — R262 Difficulty in walking, not elsewhere classified: Secondary | ICD-10-CM | POA: Diagnosis not present

## 2019-03-16 DIAGNOSIS — R296 Repeated falls: Secondary | ICD-10-CM

## 2019-03-16 DIAGNOSIS — M25662 Stiffness of left knee, not elsewhere classified: Secondary | ICD-10-CM

## 2019-03-16 DIAGNOSIS — M25552 Pain in left hip: Secondary | ICD-10-CM

## 2019-03-16 NOTE — Therapy (Signed)
[place])   .  Essential hypertension   . Chronic diastolic congestive heart failure ([place])   . History of left hip hemiarthroplasty 10/28/2018  . Wheezing on expiration   . Femur fracture, left ([place]) 10/27/2018  . Hip fracture ([place]) 10/23/2018  . Hip fracture, left ([place]) 10/23/2018  . Ankle pain 01/13/2018  . Neck pain 01/13/2018  . Constipation 01/11/2018  . Low back pain 01/11/2018  . Dysuria 12/06/2016  . Weakness 12/06/2016  . 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 Preventative health care 12/06/2016  . Epigastric pain 04/05/2016  . Spondylosis, cervical, with myelopathy 11/26/2015  . Cervicogenic headache 11/26/2015  . Umbilical hernia 35/43/0148  . Asthma 10/08/2011  . Carotid stenosis 03/31/2011  . Chronic pain   . Vitamin D deficiency 12/11/2008  . HLD (hyperlipidemia) 12/11/2008  . Anemia 12/11/2008  . Cerebral artery occlusion with cerebral infarction ([place]) 12/11/2008  . URINARY INCONTINENCE, MALE 12/11/2008  . Disorder of bursae and tendons in shoulder region 12/29/2007  . ANXIETY, MILD 08/09/2007  . HYPERTENSION, BENIGN ESSENTIAL 01/04/2007  . Allergic rhinitis 01/04/2007  . HEADACHE, TENSION 11/05/2006  . PROSTATE CANCER, HX OF 11/05/2006    Amro Winebarger,ANGIE PTA 03/16/2019, 4:09 PM  [place] [address] [place], [place], [address] Phone: [phone]   Fax:  [phone]  Name: [name] MRN: [id number] Date of Birth: 1929-03-27  [address] [place], [place], [address] Phone: [phone]   Fax:  [phone]  Physical Therapy Treatment  Patient Details  Name: [name] MRN: [id number] Date of Birth: [birth year] Referring Provider (PT): Kavin Leech   Encounter Date: 03/16/2019  PT End of Session - 03/16/19 1557    Visit Number  4    Date for PT Re-Evaluation  05/05/19    PT Start Time  5852    PT Stop Time  1608    PT Time Calculation (min)  41 min       Past Medical History:  Diagnosis Date  . Acute bronchitis 06/10/2015  . Allergic rhinitis   . Anemia   . Asthma   . Carotid stenosis    a. s/p Left CEA 2002;  b. Carotid US 3/16:  patent R CEA, L < 40%  . Chronic pain syndrome    Left shoulder, back  . Diverticulosis   . Dyslipidemia   . Epigastric pain 04/05/2016  . Esophageal stricture    Distal, benign  . GERD (gastroesophageal reflux disease)   . Hemorrhoids   . HTN (hypertension)    Negative renal duplex 07-22-11  . Hx of echocardiogram    a. Echo 10/11: mod LVH, EF 55-60%  . Pneumonia 07-2013  . Prostate cancer ([place]) 2000   Seed XRT  . Spondylosis, cervical, with myelopathy 11/26/2015  . 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 APPENDECTOMY  1953  . CAROTID ENDARTERECTOMY Right 01/2000  . CATARACT EXTRACTION Bilateral   . CERVICAL DISC SURGERY  8/07  . HIP ARTHROPLASTY Left 10/24/2018   Procedure: ARTHROPLASTY BIPOLAR HIP (HEMIARTHROPLASTY);  Surgeon: Paralee Cancel, MD;  Location: [facility];  Service: Orthopedics;  Laterality: Left;  . INSERTION PROSTATE RADIATION SEED  2000  . [place] SURGERY  1990s  . NASAL SINUS SURGERY     x 3    There were no vitals filed for this visit.  Subjective Assessment - 03/16/19 1531    Subjective  pt amb in without AD very antalgic, verb left in other car and that left knee really hurts with gait  , no pain in  non wt bearing. wife is caling to get me an MD appt for knee.    Currently in Pain?  Yes    Pain Score  7     Pain Location  Knee    Pain Orientation  Left;Medial                       [facility] Adult PT Treatment/Exercise - 03/16/19 0001      Exercises   Exercises  Lumbar;Knee/Hip      Knee/Hip Exercises: Aerobic   Nustep  Lvl 3, 7 min (LE only)       Knee/Hip Exercises: Standing   Heel Raises  Both;2 sets;10 reps;3 seconds   on black bar   Forward Step Up  2 sets;10 reps;Hand Hold: 2;Step Height: 4"   worked on [employer] and quad , heavy use of UE     Knee/Hip Exercises: Seated   Long Arc Quad  Strengthening;Left;2 sets;10 reps;Weights    Long Arc Quad Weight  2 lbs.    Long [employer] Limitations  cues for full TKE- difficult for pt    Ball Squeeze  20 x 3 sec hold  [address] [place], [place], [address] Phone: [phone]   Fax:  [phone]  Physical Therapy Treatment  Patient Details  Name: [name] MRN: [id number] Date of Birth: [birth year] Referring Provider (PT): Kavin Leech   Encounter Date: 03/16/2019  PT End of Session - 03/16/19 1557    Visit Number  4    Date for PT Re-Evaluation  05/05/19    PT Start Time  5852    PT Stop Time  1608    PT Time Calculation (min)  41 min       Past Medical History:  Diagnosis Date  . Acute bronchitis 06/10/2015  . Allergic rhinitis   . Anemia   . Asthma   . Carotid stenosis    a. s/p Left CEA 2002;  b. Carotid US 3/16:  patent R CEA, L < 40%  . Chronic pain syndrome    Left shoulder, back  . Diverticulosis   . Dyslipidemia   . Epigastric pain 04/05/2016  . Esophageal stricture    Distal, benign  . GERD (gastroesophageal reflux disease)   . Hemorrhoids   . HTN (hypertension)    Negative renal duplex 07-22-11  . Hx of echocardiogram    a. Echo 10/11: mod LVH, EF 55-60%  . Pneumonia 07-2013  . Prostate cancer ([place]) 2000   Seed XRT  . Spondylosis, cervical, with myelopathy 11/26/2015  . 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 APPENDECTOMY  1953  . CAROTID ENDARTERECTOMY Right 01/2000  . CATARACT EXTRACTION Bilateral   . CERVICAL DISC SURGERY  8/07  . HIP ARTHROPLASTY Left 10/24/2018   Procedure: ARTHROPLASTY BIPOLAR HIP (HEMIARTHROPLASTY);  Surgeon: Paralee Cancel, MD;  Location: [facility];  Service: Orthopedics;  Laterality: Left;  . INSERTION PROSTATE RADIATION SEED  2000  . [place] SURGERY  1990s  . NASAL SINUS SURGERY     x 3    There were no vitals filed for this visit.  Subjective Assessment - 03/16/19 1531    Subjective  pt amb in without AD very antalgic, verb left in other car and that left knee really hurts with gait  , no pain in  non wt bearing. wife is caling to get me an MD appt for knee.    Currently in Pain?  Yes    Pain Score  7     Pain Location  Knee    Pain Orientation  Left;Medial                       [facility] Adult PT Treatment/Exercise - 03/16/19 0001      Exercises   Exercises  Lumbar;Knee/Hip      Knee/Hip Exercises: Aerobic   Nustep  Lvl 3, 7 min (LE only)       Knee/Hip Exercises: Standing   Heel Raises  Both;2 sets;10 reps;3 seconds   on black bar   Forward Step Up  2 sets;10 reps;Hand Hold: 2;Step Height: 4"   worked on [employer] and quad , heavy use of UE     Knee/Hip Exercises: Seated   Long Arc Quad  Strengthening;Left;2 sets;10 reps;Weights    Long Arc Quad Weight  2 lbs.    Long [employer] Limitations  cues for full TKE- difficult for pt    Ball Squeeze  20 x 3 sec hold

## 2019-03-17 ENCOUNTER — Other Ambulatory Visit: Payer: Self-pay | Admitting: Physician Assistant

## 2019-03-17 MED FILL — METOPROLOL SUCCINATE ER 25: 25 | 90 days supply | Qty: 45 | Fill #0

## 2019-03-17 MED FILL — AMLODIPINE BESYLATE 5 MG TA: 5 | 90 days supply | Qty: 180 | Fill #3

## 2019-03-21 ENCOUNTER — Ambulatory Visit: Payer: PPO | Admitting: Physical Therapy

## 2019-03-21 ENCOUNTER — Other Ambulatory Visit: Payer: Self-pay

## 2019-03-21 ENCOUNTER — Encounter: Payer: Self-pay | Admitting: Physical Therapy

## 2019-03-21 DIAGNOSIS — R296 Repeated falls: Secondary | ICD-10-CM

## 2019-03-21 DIAGNOSIS — M25552 Pain in left hip: Secondary | ICD-10-CM

## 2019-03-21 DIAGNOSIS — R262 Difficulty in walking, not elsewhere classified: Secondary | ICD-10-CM

## 2019-03-21 DIAGNOSIS — M25662 Stiffness of left knee, not elsewhere classified: Secondary | ICD-10-CM

## 2019-03-21 NOTE — Therapy (Signed)
[place] [address] [place], [place], [address] Phone: [phone]   Fax:  [phone]  Physical Therapy Treatment  Patient Details  Name: ADIN LAKER MRN: [id number] Date of Birth: May 13, 1929 Referring Provider (PT): Kavin Leech   Encounter Date: 03/21/2019  PT End of Session - 03/21/19 1428    Visit Number  5    Date for PT Re-Evaluation  05/05/19    PT Start Time  9977    PT Stop Time  1428    PT Time Calculation (min)  43 min    Activity Tolerance  Patient limited by fatigue    Behavior During Therapy  [facility] for tasks assessed/performed       Past Medical History:  Diagnosis Date  . Acute bronchitis 06/10/2015  . Allergic rhinitis   . Anemia   . Asthma   . Carotid stenosis    a. s/p Left CEA 2002;  b. Carotid US 3/16:  patent R CEA, L < 40%  . Chronic pain syndrome    Left shoulder, back  . Diverticulosis   . Dyslipidemia   . Epigastric pain 04/05/2016  . Esophageal stricture    Distal, benign  . GERD (gastroesophageal reflux disease)   . Hemorrhoids   . HTN (hypertension)    Negative renal duplex 07-22-11  . Hx of echocardiogram    a. Echo 10/11: mod LVH, EF 55-60%  . Pneumonia 07-2013  . Prostate cancer ([place]) 2000   Seed XRT  . Spondylosis, cervical, with myelopathy 11/26/2015  . 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 APPENDECTOMY  1953  . CAROTID ENDARTERECTOMY Right 01/2000  . CATARACT EXTRACTION Bilateral   . CERVICAL DISC SURGERY  8/07  . HIP ARTHROPLASTY Left 10/24/2018   Procedure: ARTHROPLASTY BIPOLAR HIP (HEMIARTHROPLASTY);  Surgeon: Paralee Cancel, MD;  Location: [place];  Service: Orthopedics;  Laterality: Left;  . INSERTION PROSTATE RADIATION SEED  2000  . [place] SURGERY  1990s  . NASAL SINUS SURGERY     x 3    There were no vitals filed for this visit.  Subjective Assessment - 03/21/19 1348    Subjective  Doing ok today, amb in clinic with [facility]    Currently in Pain?  Yes    Pain Score  4     Pain Location  Leg    Pain Orientation  Left;Proximal;Medial         [facility] PT Assessment - 03/21/19 0001      Assessment   Medical Diagnosis  left hip pain, difficulty walking    Referring Provider (PT)  Z. Schwartz    Onset Date/Surgical Date  10/23/18    Prior Therapy  at the hospital and in the home      AROM   Left Knee Extension  20    Left Knee Flexion  105                   [facility] Adult PT Treatment/Exercise - 03/21/19 0001      Knee/Hip Exercises: Aerobic   Nustep  Lvl 3, 7 min (LE only)       Knee/Hip Exercises: Machines for Strengthening   Total Gym Leg Press  20lb 3x10, LLE No weight x10       Knee/Hip Exercises: Standing   Forward Step Up  10 reps;Step Height: 4";Hand Hold: 2;Left;2 sets  Cues for TKE    Other Standing Knee Exercises  Hip Abd 2lb 2x10     Other Standing Knee Exercises  Standing march with RW  2x10       Knee/Hip Exercises: Seated   Long Arc Quad  Strengthening;Left;2 sets;10 reps;Weights    Long Arc Quad Weight  2 lbs.    Hamstring Curl  Strengthening;Left;2 sets;10 reps    Hamstring Limitations  red TB     Sit to Sand  2 sets;10 reps;without UE support   UBE seat     Manual Therapy   Passive ROM  PROM to HS to increase TKE               PT Short Term Goals - 03/16/19 1558      PT SHORT TERM GOAL #1   Title  The patient will be indep with HEP for LE strengthening, flexibility, and standing balance activities.    Status  Partially Met        PT Long Term Goals - 03/16/19 1558      PT LONG TERM GOAL #1   Title  decrease left hip/knee pain 50%    Status  On-going      PT LONG TERM GOAL #2   Title  increase Berg to 40/56 will need to perform Berg next visits    Status  On-going      PT LONG TERM GOAL #3   Title  The patinet will decrease TUG  to < or equal to 18 seconds to demo decreased risk for falls.     Status  On-going      PT LONG TERM GOAL #4   Title  The patient will negotiate community surfaces without a device independently (including grass, curbs, inclines) x 500 feet.    Status  On-going      PT LONG TERM GOAL #5   Title  increase strength of the left hip and knee to 4+/5    Status  On-going            Plan - 03/21/19 1429    Clinical Impression Statement  Pt have progressed increasing his AL knee active flexion but is still lacking extension. He did not have much knee pain today. L knee has a springy end feel with extension. Cues throughout to fully extend knee. Knees does adduct with sit to stand and on the leg press.    Personal Factors and Comorbidities  Comorbidity 3+    Comorbidities  multiple comorbidities >5    Stability/Clinical Decision Making  Evolving/Moderate complexity    Rehab Potential  Fair    PT Frequency  2x / week    PT Duration  12 weeks    PT Treatment/Interventions  ADLs/Self Care Home Management;Electrical Stimulation;Cryotherapy;Moist Heat;Stair training;Gait training;Functional mobility training;Therapeutic activities;Therapeutic exercise;Balance training;Manual techniques;Patient/family education    PT Next Visit Plan  gait ,balance, strength, knee ROM and quad strength       Patient will benefit from skilled therapeutic intervention in order to improve the following deficits and impairments:  Abnormal gait, Cardiopulmonary status limiting activity, Decreased activity tolerance, Decreased endurance, Decreased range of motion, Decreased strength, Difficulty walking, Impaired flexibility, Decreased balance, Decreased mobility, Pain  Visit Diagnosis: Pain in left hip  Difficulty in walking, not elsewhere classified  Repeated falls  Stiffness of left knee, not elsewhere classified     Problem List Patient Active Problem List   Diagnosis Date Noted  . Controlled type 2 diabetes mellitus with hyperglycemia,  without long-term current use of  insulin ([place])   . Labile blood glucose   . Postoperative pain   . Closed left hip fracture, sequela 10/29/2018  . Acute blood loss anemia   . AKI (acute kidney injury) ([place])   . Diabetic peripheral neuropathy ([place])   . Essential hypertension   . Chronic diastolic congestive heart failure ([place])   . History of left hip hemiarthroplasty 10/28/2018  . Wheezing on expiration   . Femur fracture, left ([place]) 10/27/2018  . Hip fracture ([place]) 10/23/2018  . Hip fracture, left ([place]) 10/23/2018  . Ankle pain 01/13/2018  . Neck pain 01/13/2018  . Constipation 01/11/2018  . Low back pain 01/11/2018  . Dysuria 12/06/2016  . Weakness 12/06/2016  . 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 Preventative health care 12/06/2016  . Epigastric pain 04/05/2016  . Spondylosis, cervical, with myelopathy 11/26/2015  . Cervicogenic headache 11/26/2015  . Umbilical hernia 61/61/2240  . Asthma 10/08/2011  . Carotid stenosis 03/31/2011  . Chronic pain   . Vitamin D deficiency 12/11/2008  . HLD (hyperlipidemia) 12/11/2008  . Anemia 12/11/2008  . Cerebral artery occlusion with cerebral infarction ([place]) 12/11/2008  . URINARY INCONTINENCE, MALE 12/11/2008  . Disorder of bursae and tendons in shoulder region 12/29/2007  . ANXIETY, MILD 08/09/2007  . HYPERTENSION, BENIGN ESSENTIAL 01/04/2007  . Allergic rhinitis 01/04/2007  . HEADACHE, TENSION 11/05/2006  . PROSTATE CANCER, HX OF 11/05/2006    Scot Jun, PTA 03/21/2019, 2:37 PM  [address] [place], [place], [address] Phone: [phone]   Fax:  [phone]  Name: ROHNAN BARTLESON MRN: [id number] Date of Birth: 1929/04/11

## 2019-03-24 ENCOUNTER — Other Ambulatory Visit: Payer: Self-pay | Admitting: Family Medicine

## 2019-03-24 NOTE — Telephone Encounter (Signed)
Requesting:tramadol Contract:yes UDS:n/a Last OV:11/24/18 Next OV:06/08/19 Last Refill:02/26/19 #[id number] Database:   Please advise

## 2019-03-27 MED FILL — traMADol HCL 50 MG TABS: 50 | 30 days supply | Qty: 90 | Fill #0

## 2019-03-28 ENCOUNTER — Ambulatory Visit: Payer: PPO | Admitting: Physical Therapy

## 2019-03-29 ENCOUNTER — Encounter: Payer: PPO | Admitting: Physical Medicine & Rehabilitation

## 2019-03-30 ENCOUNTER — Encounter: Payer: Self-pay | Admitting: Physical Therapy

## 2019-03-30 ENCOUNTER — Other Ambulatory Visit: Payer: Self-pay

## 2019-03-30 ENCOUNTER — Ambulatory Visit: Payer: PPO | Admitting: Physical Therapy

## 2019-03-30 DIAGNOSIS — R262 Difficulty in walking, not elsewhere classified: Secondary | ICD-10-CM | POA: Diagnosis not present

## 2019-03-30 DIAGNOSIS — M25552 Pain in left hip: Secondary | ICD-10-CM

## 2019-03-30 DIAGNOSIS — M25662 Stiffness of left knee, not elsewhere classified: Secondary | ICD-10-CM

## 2019-03-30 DIAGNOSIS — R296 Repeated falls: Secondary | ICD-10-CM

## 2019-03-30 NOTE — Therapy (Signed)
[place], [place], [address] Phone: [phone]   Fax:  [phone]  Physical Therapy Treatment  Patient Details  Name: [name] MRN: [id number] Date of Birth: September 26, 1929 Referring Provider (PT): Kavin Leech   Encounter Date: 03/30/2019  PT End of Session - 03/30/19 1642    Visit Number  6    Date for PT Re-Evaluation  05/05/19    PT Start Time  1608    PT Stop Time  1647    PT Time Calculation (min)  39 min    Activity Tolerance  Patient tolerated treatment well    Behavior During Therapy  [facility] for tasks assessed/performed       Past Medical History:  Diagnosis Date  . Acute bronchitis 06/10/2015  . Allergic rhinitis   . Anemia   . Asthma   . Carotid stenosis    a. s/p Left CEA 2002;  b. Carotid US 3/16:  patent R CEA, L < 40%  . Chronic pain syndrome    Left shoulder, back  . Diverticulosis   . Dyslipidemia   . Epigastric pain 04/05/2016  . Esophageal stricture    Distal, benign  . GERD (gastroesophageal reflux disease)   . Hemorrhoids   . HTN (hypertension)    Negative renal duplex 07-22-11  . Hx of echocardiogram    a. Echo 10/11: mod LVH, EF 55-60%  . Pneumonia 07-2013  . Prostate cancer ([place]) 2000   Seed XRT  . Spondylosis, cervical, with myelopathy 11/26/2015  . 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 APPENDECTOMY  1953  . CAROTID ENDARTERECTOMY Right 01/2000  . CATARACT EXTRACTION Bilateral   . CERVICAL DISC SURGERY  8/07  . HIP ARTHROPLASTY Left 10/24/2018   Procedure: ARTHROPLASTY BIPOLAR HIP (HEMIARTHROPLASTY);  Surgeon: Paralee Cancel, MD;  Location: [place];  Service: Orthopedics;  Laterality: Left;  . INSERTION PROSTATE RADIATION SEED  2000  . [place] SURGERY  1990s  . NASAL SINUS SURGERY     x 3    There were no vitals filed for this visit.      [facility] PT Assessment -  03/30/19 0001      Standardized Balance Assessment   Standardized Balance Assessment  Timed Up and Go Test      Timed Up and Go Test   Normal TUG (seconds)  15.33    TUG Comments  SPC                   [facility] Adult PT Treatment/Exercise - 03/30/19 0001      High Level Balance   High Level Balance Activities  Negotiating over obstacles    High Level Balance Comments  front and side steppin over objects      Knee/Hip Exercises: Aerobic   Nustep  Lvl 3, 7 min      Knee/Hip Exercises: Machines for Strengthening   Cybex Knee Extension  5lb 2x10     Total Gym Leg Press  20lb 3x10,      Knee/Hip Exercises: Seated   Hamstring Curl  Strengthening;Left;2 sets;10 reps    Hamstring Limitations  green Tband     Sit to Sand  2 sets;10 reps;without UE support   airex on mat               PT Short Term Goals - 03/16/19 1558  PT SHORT TERM GOAL #1   Title  The patient will be indep with HEP for LE strengthening, flexibility, and standing balance activities.    Status  Partially Met        PT Long Term Goals - 03/30/19 1620      PT LONG TERM GOAL #3   Title  The patinet will decrease TUG  to < or equal to 18 seconds to demo decreased risk for falls.    Status  Achieved            Plan - 03/30/19 1647    Clinical Impression Statement  Pt did well with today's interventions. He has progressed decreasing his TUG time. Cues to do the full ROM with all machine level interventions. Decrease TKE with leg extensions. He did really well stepping over objects alt pattern and step to pattern    Personal Factors and Comorbidities  Comorbidity 3+    Comorbidities  multiple comorbidities >5    Stability/Clinical Decision Making  Evolving/Moderate complexity    Rehab Potential  Fair    PT Frequency  2x / week    PT Duration  12 weeks    PT Treatment/Interventions  ADLs/Self Care Home Management;Electrical Stimulation;Cryotherapy;Moist Heat;Stair training;Gait  training;Functional mobility training;Therapeutic activities;Therapeutic exercise;Balance training;Manual techniques;Patient/family education    PT Next Visit Plan  gait ,balance, strength, knee ROM and quad strength       Patient will benefit from skilled therapeutic intervention in order to improve the following deficits and impairments:  Abnormal gait, Cardiopulmonary status limiting activity, Decreased activity tolerance, Decreased endurance, Decreased range of motion, Decreased strength, Difficulty walking, Impaired flexibility, Decreased balance, Decreased mobility, Pain  Visit Diagnosis: Pain in left hip  Difficulty in walking, not elsewhere classified  Repeated falls  Stiffness of left knee, not elsewhere classified     Problem List Patient Active Problem List   Diagnosis Date Noted  . Controlled type 2 diabetes mellitus with hyperglycemia, without long-term current use of insulin ([place])   . Labile blood glucose   . Postoperative pain   . Closed left hip fracture, sequela 10/29/2018  . Acute blood loss anemia   . AKI (acute kidney injury) ([place])   . Diabetic peripheral neuropathy ([place])   . Essential hypertension   . Chronic diastolic congestive heart failure ([place])   . History of left hip hemiarthroplasty 10/28/2018  . Wheezing on expiration   . Femur fracture, left ([place]) 10/27/2018  . Hip fracture ([place]) 10/23/2018  . Hip fracture, left ([place]) 10/23/2018  . Ankle pain 01/13/2018  . Neck pain 01/13/2018  . Constipation 01/11/2018  . Low back pain 01/11/2018  . Dysuria 12/06/2016  . Weakness 12/06/2016  . 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 Preventative health care 12/06/2016  . Epigastric pain 04/05/2016  . Spondylosis, cervical, with myelopathy 11/26/2015  . Cervicogenic headache 11/26/2015  . Umbilical hernia 44/01/270  . Asthma 10/08/2011  . Carotid stenosis 03/31/2011  . Chronic pain   . Vitamin D deficiency 12/11/2008  . HLD (hyperlipidemia)  12/11/2008  . Anemia 12/11/2008  . Cerebral artery occlusion with cerebral infarction ([place]) 12/11/2008  . URINARY INCONTINENCE, MALE 12/11/2008  . Disorder of bursae and tendons in shoulder region 12/29/2007  . ANXIETY, MILD 08/09/2007  . HYPERTENSION, BENIGN ESSENTIAL 01/04/2007  . Allergic rhinitis 01/04/2007  . HEADACHE, TENSION 11/05/2006  . PROSTATE CANCER, HX OF 11/05/2006    Scot Jun, PTA 03/30/2019, 4:54 PM  [place]  [address] [place], [place], [address] Phone: [phone]   Fax:  [phone]  Name: [name] MRN: [id number] Date of Birth: 03-29-1929

## 2019-04-03 ENCOUNTER — Ambulatory Visit: Payer: PPO | Admitting: Internal Medicine

## 2019-04-06 ENCOUNTER — Ambulatory Visit: Payer: PPO | Admitting: Physical Therapy

## 2019-04-07 ENCOUNTER — Other Ambulatory Visit: Payer: Self-pay | Admitting: Physician Assistant

## 2019-04-07 ENCOUNTER — Encounter: Payer: Self-pay | Admitting: Pharmacist

## 2019-04-07 MED FILL — ELIQUIS 5 MG TABLET: 5 | 90 days supply | Qty: 180 | Fill #1

## 2019-04-07 MED FILL — METOCLOPRAMIDE 5 MG TABLET: 5 | 30 days supply | Qty: 30 | Fill #4

## 2019-04-07 MED FILL — MINOXIDIL 10 MG TABLET: 10 | 30 days supply | Qty: 60 | Fill #5

## 2019-04-07 NOTE — Telephone Encounter (Signed)
This encounter was created in error - please disregard.

## 2019-04-07 NOTE — Telephone Encounter (Signed)
Prescription refill request for Eliquis received.  Last office visit: Harrington Challenger 12/23/2018 Scr: 0.94, 12/23/2018 Age: 86 y.o. Weight: 71.2 kg   Prescription refill sent.

## 2019-04-13 ENCOUNTER — Other Ambulatory Visit: Payer: Self-pay | Admitting: Internal Medicine

## 2019-04-13 MED FILL — PANTOPRAZOLE SOD DR 40 MG T: 40 | 90 days supply | Qty: 90 | Fill #0

## 2019-04-20 ENCOUNTER — Encounter: Payer: Self-pay | Admitting: Physical Therapy

## 2019-04-20 ENCOUNTER — Ambulatory Visit: Payer: PPO | Attending: Physical Medicine & Rehabilitation | Admitting: Physical Therapy

## 2019-04-20 ENCOUNTER — Other Ambulatory Visit: Payer: Self-pay

## 2019-04-20 DIAGNOSIS — R296 Repeated falls: Secondary | ICD-10-CM | POA: Insufficient documentation

## 2019-04-20 DIAGNOSIS — R262 Difficulty in walking, not elsewhere classified: Secondary | ICD-10-CM | POA: Insufficient documentation

## 2019-04-20 DIAGNOSIS — M25552 Pain in left hip: Secondary | ICD-10-CM | POA: Diagnosis not present

## 2019-04-20 NOTE — Therapy (Signed)
[place], [place], [address] Phone: [phone]   Fax:  [phone]  Physical Therapy Treatment  Patient Details  Name: [name] MRN: [id number] Date of Birth: 1929-08-04 Referring Provider (PT): Kavin Leech   Encounter Date: 04/20/2019  PT End of Session - 04/20/19 1638    Visit Number  7    Date for PT Re-Evaluation  05/05/19    PT Start Time  1600    PT Stop Time  1650    PT Time Calculation (min)  50 min    Activity Tolerance  Patient limited by pain    Behavior During Therapy  [facility] for tasks assessed/performed       Past Medical History:  Diagnosis Date  . Acute bronchitis 06/10/2015  . Allergic rhinitis   . Anemia   . Asthma   . Carotid stenosis    a. s/p Left CEA 2002;  b. Carotid US 3/16:  patent R CEA, L < 40%  . Chronic pain syndrome    Left shoulder, back  . Diverticulosis   . Dyslipidemia   . Epigastric pain 04/05/2016  . Esophageal stricture    Distal, benign  . GERD (gastroesophageal reflux disease)   . Hemorrhoids   . HTN (hypertension)    Negative renal duplex 07-22-11  . Hx of echocardiogram    a. Echo 10/11: mod LVH, EF 55-60%  . Pneumonia 07-2013  . Prostate cancer ([place]) 2000   Seed XRT  . Spondylosis, cervical, with myelopathy 11/26/2015  . Stroke [facility]) 8/01   right thalamic - on chronic Plavix/ASA    Past Surgical History:  Procedure Laterality Date  . APPENDECTOMY  1953  . CAROTID ENDARTERECTOMY Right 01/2000  . CATARACT EXTRACTION Bilateral   . CERVICAL DISC SURGERY  8/07  . HIP ARTHROPLASTY Left 10/24/2018   Procedure: ARTHROPLASTY BIPOLAR HIP (HEMIARTHROPLASTY);  Surgeon: Paralee Cancel, MD;  Location: [place];  Service: Orthopedics;  Laterality: Left;  . INSERTION PROSTATE RADIATION SEED  2000  . [place] SURGERY  1990s  . NASAL SINUS SURGERY     x 3    There were no vitals filed for this visit.  Subjective Assessment - 04/20/19 1604    Subjective  pt reports that he has not been to therapy due to increase L knee pain. Pt reports that he went walking with his daughter about 4 to 5 weeks ago and when he started down the hill it caught him in the medial L knee.    Limitations  Lifting;Standing;Walking;House hold activities    Currently in Pain?  Yes    Pain Score  8     Pain Location  Knee         [facility] PT Assessment - 04/20/19 0001      AROM   Left Knee Extension  22    Left Knee Flexion  102                   [facility] Adult PT Treatment/Exercise - 04/20/19 0001      Knee/Hip Exercises: Aerobic   Nustep  Lvl 3, 7 min      Knee/Hip Exercises: Seated   Long Arc Quad  Strengthening;Left;Weights;3 sets;5 reps    Other Seated Knee/Hip Exercises  L knww quad sets x10    Marching  Left;2 sets;5 reps;Weights    Marching Weights  2 lbs.    Hamstring Curl  Strengthening;Left;2 sets;15 reps  Hamstring Limitations  yelow Tband       Modalities   Modalities  Cryotherapy      Cryotherapy   Number Minutes Cryotherapy  10 Minutes    Cryotherapy Location  Knee    Type of Cryotherapy  Ice pack      Manual Therapy   Passive ROM  L knee flexion and ext                PT Short Term Goals - 04/20/19 1642      PT SHORT TERM GOAL #1   Title  The patient will be indep with HEP for LE strengthening, flexibility, and standing balance activities.    Status  Partially Met      PT SHORT TERM GOAL #2   Title  The patient will improve Berg balance score from 37/56 to > or equal to 43/56 to demo decreasing risk for falls.        PT Long Term Goals - 03/30/19 1620      PT LONG TERM GOAL #3   Title  The patinet will decrease TUG  to < or equal to 18 seconds to demo decreased risk for falls.    Status  Achieved            Plan - 04/20/19 1640    Clinical Impression Statement  Pt enters clinic reporting increase L knee pain. Some warmth in the medial aspect of L knee was present. Pt also has some  bilateral LE swelling. Painful with PROM mostly with ext. No L knee ROM change he is still very limited with extension. He did well with HS curls but struggled with interventions that used quad.    Personal Factors and Comorbidities  Comorbidity 3+    Comorbidities  multiple comorbidities >5    Rehab Potential  Fair    PT Frequency  2x / week    PT Duration  12 weeks    PT Treatment/Interventions  ADLs/Self Care Home Management;Electrical Stimulation;Cryotherapy;Moist Heat;Stair training;Gait training;Functional mobility training;Therapeutic activities;Therapeutic exercise;Balance training;Manual techniques;Patient/family education    PT Next Visit Plan  gait ,balance, strength, knee ROM and quad strength, Adcised pt ti scheduale a MD asspointment       Patient will benefit from skilled therapeutic intervention in order to improve the following deficits and impairments:  Abnormal gait, Cardiopulmonary status limiting activity, Decreased activity tolerance, Decreased endurance, Decreased range of motion, Decreased strength, Difficulty walking, Impaired flexibility, Decreased balance, Decreased mobility, Pain  Visit Diagnosis: Pain in left hip  Difficulty in walking, not elsewhere classified  Repeated falls     Problem List Patient Active Problem List   Diagnosis Date Noted  . Controlled type 2 diabetes mellitus with hyperglycemia, without long-term current use of insulin ([place])   . Labile blood glucose   . Postoperative pain   . Closed left hip fracture, sequela 10/29/2018  . Acute blood loss anemia   . AKI (acute kidney injury) ([place])   . Diabetic peripheral neuropathy ([place])   . Essential hypertension   . Chronic diastolic congestive heart failure ([place])   . History of left hip hemiarthroplasty 10/28/2018  . Wheezing on expiration   . Femur fracture, left ([place]) 10/27/2018  . Hip fracture ([place]) 10/23/2018  . Hip fracture, left ([place]) 10/23/2018  . Ankle pain 01/13/2018  . Neck  pain 01/13/2018  . Constipation 01/11/2018  . Low back pain 01/11/2018  . Dysuria 12/06/2016  . Weakness 12/06/2016  . 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  Preventative health care 12/06/2016  . Epigastric pain 04/05/2016  . Spondylosis, cervical, with myelopathy 11/26/2015  . Cervicogenic headache 11/26/2015  . Umbilical hernia 22/41/1464  . Asthma 10/08/2011  . Carotid stenosis 03/31/2011  . Chronic pain   . Vitamin D deficiency 12/11/2008  . HLD (hyperlipidemia) 12/11/2008  . Anemia 12/11/2008  . Cerebral artery occlusion with cerebral infarction ([place]) 12/11/2008  . URINARY INCONTINENCE, MALE 12/11/2008  . Disorder of bursae and tendons in shoulder region 12/29/2007  . ANXIETY, MILD 08/09/2007  . HYPERTENSION, BENIGN ESSENTIAL 01/04/2007  . Allergic rhinitis 01/04/2007  . HEADACHE, TENSION 11/05/2006  . PROSTATE CANCER, HX OF 11/05/2006    Scot Jun, PTA 04/20/2019, 4:43 PM  [place] [address] [place] [place], [place], [address] Phone: [phone]   Fax:  [phone]  Name: [name] MRN: [id number] Date of Birth: 04-04-1929

## 2019-04-24 ENCOUNTER — Other Ambulatory Visit: Payer: Self-pay | Admitting: Family Medicine

## 2019-04-25 MED FILL — traMADol HCL 50 MG TABS: 50 | 30 days supply | Qty: 90 | Fill #0

## 2019-04-28 ENCOUNTER — Ambulatory Visit (HOSPITAL_BASED_OUTPATIENT_CLINIC_OR_DEPARTMENT_OTHER)
Admission: RE | Admit: 2019-04-28 | Discharge: 2019-04-28 | Disposition: A | Discharge status: Home / Self Care | Payer: PPO | Source: Ambulatory Visit | Attending: Medical | Admitting: Medical

## 2019-04-28 ENCOUNTER — Encounter: Payer: Self-pay | Admitting: Medical

## 2019-04-28 ENCOUNTER — Ambulatory Visit (INDEPENDENT_AMBULATORY_CARE_PROVIDER_SITE_OTHER): Payer: PPO | Admitting: Medical

## 2019-04-28 ENCOUNTER — Other Ambulatory Visit: Payer: Self-pay

## 2019-04-28 VITALS — BP 140/60 | HR 64 | Temp 97.7°F | Resp 18 | Ht 71.0 in | Wt 162.0 lb

## 2019-04-28 DIAGNOSIS — S8992XA Unspecified injury of left lower leg, initial encounter: Secondary | ICD-10-CM | POA: Diagnosis not present

## 2019-04-28 DIAGNOSIS — M25562 Pain in left knee: Secondary | ICD-10-CM

## 2019-04-28 DIAGNOSIS — M25559 Pain in unspecified hip: Secondary | ICD-10-CM

## 2019-04-28 DIAGNOSIS — S79912A Unspecified injury of left hip, initial encounter: Secondary | ICD-10-CM | POA: Diagnosis not present

## 2019-04-28 DIAGNOSIS — M1712 Unilateral primary osteoarthritis, left knee: Secondary | ICD-10-CM | POA: Diagnosis not present

## 2019-04-28 DIAGNOSIS — Z96642 Presence of left artificial hip joint: Secondary | ICD-10-CM | POA: Diagnosis not present

## 2019-04-28 MED ORDER — METHYLPREDNISOLONE 4 MG PO TABS: ORAL_TABLET | ORAL | 0 refills | Status: DC

## 2019-04-28 MED FILL — CloNIDine HCL 0.1 MG TAB: 0.1 | 30 days supply | Qty: 90 | Fill #3

## 2019-04-28 MED FILL — METHYLPREDNISOLONE 4 MG TAB: 4 | 5 days supply | Qty: 15 | Fill #0

## 2019-04-28 NOTE — Progress Notes (Signed)
   Subjective:    Patient ID: [name], male    DOB: Jun 17, 1929, 86 y.o.   MRN: [id number]  HPI  Pt in for left knee pain with some left hip pain.  He states pain walking other day with his daughter. Pain for about 2 weeks.   In  october had hip fracture with  hip replacement.  He states he did recover from surgery in the past. But just now recent pain.  States pain level 7/10 pain when he walks. But at rest no pain.  Pt already takes tramadol for back.  Former when younger and got soludmedrol iv for prolonged period got agitated insomni and a lot of weight.         Review of Systems See hpi    Objective:   Physical Exam  General- No acute distress. Pleasant patient. Neck- Full range of motion, no jvd Lungs- Clear, even and unlabored. Heart- regular rate and rhythm. Neurologic- CNII- XII grossly intact.  Left hip- pain on palpation and range of motion. Left knee- mild swelling and pain on palpation over tibial plateau medial aspect.  Left lower ext- no calf swelling. Negative homans sign.      Assessment & Plan:  For your recent left hip and knee pain will get xray of both today.  Will rx low dose medrol taper today. Rx advisement given.  Continue tramadol   For knee pain consider ace wrap.  Follow up 7-10 days or as needed. After xray review might refer to ortho or sports med.  Time spent with patient today was 25  minutes which consisted of chart revdiew, discussing diagnosis, work up, treatment and documentation.

## 2019-04-28 NOTE — Patient Instructions (Signed)
For your recent left hip and knee pain will get xray of both today.  Will rx low dose medrol taper today. Rx advisement given.  Continue tramadol   For knee pain consider ace wrap.  Follow up 7-10 days or as needed. After xray review might refer to ortho or sports med.

## 2019-04-29 ENCOUNTER — Encounter: Payer: Self-pay | Admitting: Medical

## 2019-05-01 ENCOUNTER — Telehealth: Payer: Self-pay | Admitting: Family Medicine

## 2019-05-01 ENCOUNTER — Other Ambulatory Visit: Payer: Self-pay | Admitting: Family Medicine

## 2019-05-01 NOTE — Telephone Encounter (Signed)
[name] states that she wanted to know whats the best results for his low Oxygen levels. She states it was low at 18 when he was in the office and she wants to know if she needs to have him walk more or to get some type of oxygen treatment

## 2019-05-01 NOTE — Telephone Encounter (Signed)
Let her know 39 is acceptable but deep breathing would not hurt have him do deep breaths feeling his chest wall open up 10 times do 3 sets a day and hold each deep breath for 3 seconds. Also she should get a pulse oximeter to monitor his oxygen at home. Any where in the 90s is acceptable. We do not treat with oxygen til it drops to 88 or less.

## 2019-05-02 ENCOUNTER — Other Ambulatory Visit: Payer: Self-pay | Admitting: Family Medicine

## 2019-05-02 ENCOUNTER — Telehealth: Payer: Self-pay

## 2019-05-02 MED ORDER — AMOXICILLIN-POT CLAVULANATE 875-125 MG PO TABS: 1.0000 | ORAL_TABLET | Freq: Two times a day (BID) | ORAL | 0 refills | Status: DC

## 2019-05-02 NOTE — Telephone Encounter (Signed)
Patient wife called in to get an update on a prescription for his tooth pain Please follow up with the patient and advise at [phone]

## 2019-05-02 NOTE — Telephone Encounter (Signed)
I sent Augmentin into the pharmacy to help with the infection. If his pain getsworse though he will need a dentist as we cannot do much more medically

## 2019-05-02 NOTE — Telephone Encounter (Signed)
See other phone message.  Still awaiting response from PCP

## 2019-05-02 NOTE — Telephone Encounter (Signed)
Caller : Patent's wife   Call Back [phone]  Subject : Toothache  Patient's wife  states that patient has a toothache, jaw swelling. Per Wife patient's dentist has retired and the new dentist cant see patient until may 5.   Please advise

## 2019-05-03 ENCOUNTER — Telehealth: Payer: Self-pay | Admitting: *Deleted

## 2019-05-03 MED FILL — AMOX-CLAV 875-125 MG TABLET: 875-125 | 10 days supply | Qty: 20 | Fill #0

## 2019-05-03 NOTE — Telephone Encounter (Signed)
He follows with cardiology and they manage his A fib and Eliquis they should contact them regarding how to bridge holding his Eliquis

## 2019-05-03 NOTE — Telephone Encounter (Signed)
Patient did get medication last night and he is at dentist now.

## 2019-05-03 NOTE — Telephone Encounter (Signed)
Anderson Malta from dentist office stated that they need to extract 2 teeth next Wednesday and patient needs to be off blood thinner 5 days prior.    Anderson Malta [phone]

## 2019-05-04 ENCOUNTER — Telehealth: Payer: Self-pay | Admitting: Internal Medicine

## 2019-05-04 MED FILL — MINOXIDIL 10 MG TABLET: 10 | 30 days supply | Qty: 60 | Fill #6

## 2019-05-04 MED FILL — ATORVASTATIN 40 MG TABLET: 40 | 90 days supply | Qty: 90 | Fill #1

## 2019-05-04 MED FILL — LISINOPRIL 2.5 MG TABLET: 2.5 | 90 days supply | Qty: 90 | Fill #1

## 2019-05-04 NOTE — Telephone Encounter (Signed)
   Primary Cardiologist: [name], MD  Chart reviewed as part of pre-operative protocol coverage. Given past medical history and time since last visit, based on ACC/AHA guidelines, [name] would be at acceptable risk for the planned procedure without further cardiovascular testing.   Patient with diagnosis of afib on Eliquis for anticoagulation.    Procedure: 2 dental extractions Date of procedure: 05/10/19  CHADS2-VASc score of 8 (age x2, CHF, HTN, DM, CAD, stroke)  CrCl 71mL/min Platelet count 271  Due to elevated cardiac risk and low bleed risk procedure, recommend continuing Eliquis for 2 dental extractions. If a hold is required, would recommend holding 1 dose of Eliquis (12 hours) prior to procedure and resume as soon as safely possible afterwards.  He does not require pre-op antibiotics.  I will route this recommendation to the requesting party via Epic fax function and remove from pre-op pool.  Please call with questions.  [name]. Marguriete Wootan NP-C    05/04/2019, 4:49 PM [address] Office [phone] Fax [phone]

## 2019-05-04 NOTE — Telephone Encounter (Signed)
Per Mrs Demetrius Please contact Danae Chen  At  Dr. Suszanne Conners [phone].[name] KitchenMarland KitchenMarland Kitchen

## 2019-05-04 NOTE — Telephone Encounter (Signed)
1. What dental office are you calling from?  Dr[facility]  2. What is your office phone number? [phone]  3. What is your fax number? [phone]  4. What type of procedure is the patient having performed? Extraction of 2 teeth  5. What date is procedure scheduled or is the patient there now?  (if the patient is at the dentist's office question goes to their cardiologist if he/she is in the office.  If not, question should go to the DOD). 05/10/19   6. What is your question (ex. Antibiotics prior to procedure, holding medication-we need to know how long dentist wants pt to hold med)? Anderson Malta with Dr. Dellis Filbert [facility]ist Office states she is requesting to hold the patent's blood thinner for a few days prior to the appointment. She states no one will be in Dr. Valda Lamb office from 5:00 PM today, 05/04/19 until Monday morning, 05/08/19. However, our preop team may leave a detailed voice message or send their office an email at [email or web address]

## 2019-05-04 NOTE — Telephone Encounter (Signed)
No answer/ no machine for dental office.  Need to try another number.  Left message on pt machine for them to call back, just need the name of the dental office or dentist that will be doing the extraction.

## 2019-05-04 NOTE — Telephone Encounter (Signed)
Patient with diagnosis of afib on Eliquis for anticoagulation.    Procedure: 2 dental extractions Date of procedure: 05/10/19  CHADS2-VASc score of 8 (age x2, CHF, HTN, DM, CAD, stroke)  CrCl 42mL/min Platelet count 271  Due to elevated cardiac risk and low bleed risk procedure, recommend continuing Eliquis for 2 dental extractions. If a hold is required, would recommend holding 1 dose of Eliquis (12 hours) prior to procedure and resume as soon as safely possible afterwards.  He does not require pre-op antibiotics.

## 2019-05-04 NOTE — Telephone Encounter (Signed)
Advised dental office that they must call pt cardiologist since they manage the medication.  Cardiologist name and number given.

## 2019-05-05 ENCOUNTER — Other Ambulatory Visit: Payer: Self-pay | Admitting: Family Medicine

## 2019-05-05 ENCOUNTER — Telehealth: Payer: Self-pay | Admitting: Internal Medicine

## 2019-05-05 ENCOUNTER — Telehealth: Payer: Self-pay

## 2019-05-05 MED FILL — GABAPENTIN 300 MG CAPSULE: 300 | 90 days supply | Qty: 270 | Fill #0

## 2019-05-05 NOTE — Telephone Encounter (Signed)
Patient wife called in needing to speak with Dr. Charlett Blake or the nurse about the patients medication Please call the patient Wife at [phone]

## 2019-05-05 NOTE — Telephone Encounter (Signed)
   Went to pt's chart to check dental clearance follow up

## 2019-05-08 NOTE — Telephone Encounter (Signed)
Patient called cardiologist and spoke with PA and they advised on what to do about blood thinner.

## 2019-05-10 NOTE — Progress Notes (Signed)
Cardiology Office Note   Date:  05/11/2019   ID:  [name], DOB [birth year], MRN [id number]  PCP:  [name], MD  Cardiologist:   [name], MD      History of Present Illness: [name] is a 86 y.o. male with a history of HTN, CV dz (s/p L CEA 2002), HL, GERD, reactive airway dz, esophageal stricture, and PAF    I last saw him in [place] in May 2020  He was seen by [name] in Aug 2020  At that time echo was scheduled  Lasix and K started for edema   Echo showed noraml LVEF and RVEF  [facility]   He went on to have rehab as well   The pt says his breathing is OK   Denies CP   No palpitations.  No edema  I saw the pt in Dec 2020  SInce seen he says his breathing is OK   He deneis CP Rare dizziness   His wife says he sleeps a lot     Current Meds  Medication Sig  . acetaminophen (TYLENOL) 500 MG tablet Take 500 mg by mouth every 6 (six) hours as needed for mild pain or headache.  . albuterol (PROVENTIL HFA;VENTOLIN HFA) 108 (90 Base) MCG/ACT inhaler Inhale 1-2 puffs into the lungs every 6 (six) hours as needed for wheezing or shortness of breath.  [name] Kitchen amLODipine (NORVASC) 5 MG tablet Take 2 tablets (10 mg total) by mouth daily.  [name] Kitchen amoxicillin-clavulanate (AUGMENTIN) 875-125 MG tablet Take 1 tablet by mouth 2 (two) times daily.  [name] Kitchen atorvastatin (LIPITOR) 40 MG tablet TAKE 1 TABLET BY MOUTH DAILY.  . cloNIDine (CATAPRES) 0.1 MG tablet Take 1 tablet (0.1 mg total) by mouth 2 (two) times daily.  [name] Kitchen ELIQUIS 5 MG TABS tablet TAKE 1 TABLET BY MOUTH TWICE DAILY  . ferrous sulfate (FERROUSUL) 325 (65 FE) MG tablet Take 1 tablet (325 mg total) by mouth 3 (three) times daily with meals for 14 days.  . fluticasone (FLOVENT HFA) 110 MCG/ACT inhaler Inhale 2 puffs into the lungs 2 (two) times daily.  [name] Kitchen gabapentin (NEURONTIN) 300 MG capsule TAKE 1 CAPSULE BY MOUTH 3 TIMES DAILY  . lisinopril (ZESTRIL) 2.5 MG tablet TAKE 1 TABLET (2.5 MG  TOTAL) BY MOUTH DAILY.  [name] Kitchen Menthol-Methyl Salicylate (MUSCLE RUB) 10-15 % CREA Apply 1 application topically 3 (three) times daily with meals. To BLE  . methylPREDNISolone (MEDROL) 4 MG tablet 5 tab po day 1, 4 tab po day 2, 3 tab po day 3, 2 tab po day 4 and 1 tab po day 5.  . metoCLOPramide (REGLAN) 5 MG tablet TAKE 1 TABLET BY MOUTH AT BEDTIME  . metoprolol succinate (TOPROL-XL) 25 MG 24 hr tablet TAKE 1/2 TABLET BY MOUTH DAILY  . minoxidil (LONITEN) 10 MG tablet Take 1 tablet (10 mg total) by mouth 2 (two) times daily.  . pantoprazole (PROTONIX) 40 MG tablet TAKE 1 TABLET BY MOUTH ONCE A DAY BEFORE BREAKFAST (NEEDS OV)  . polyethylene glycol (MIRALAX / GLYCOLAX) 17 g packet Take 17 g by mouth 2 (two) times daily.  . polyvinyl alcohol (ARTIFICIAL TEARS) 1.4 % ophthalmic solution Place 1 drop into both eyes as needed for dry eyes.   . traMADol (ULTRAM) 50 MG tablet TAKE 1 TABLET BY MOUTH 3 TIMES DAILY AS NEEDED FOR MODERATE PAIN  . triamcinolone cream (KENALOG) 0.5 % Apply 1 application topically  daily as needed (for skin irritation).      Allergies:   Hctz [hydrochlorothiazide], Hydralazine, Diovan [valsartan], Metformin and related, Nsaids, Other, Prednisone, Spironolactone, Codeine, and Oxycodone-acetaminophen   Past Medical History:  Diagnosis Date  . Acute bronchitis 06/10/2015  . Allergic rhinitis   . Anemia   . Asthma   . Carotid stenosis    a. s/p Left CEA 2002;  b. Carotid US 3/16:  patent R CEA, L < 40%  . Chronic pain syndrome    Left shoulder, back  . Diverticulosis   . Dyslipidemia   . Epigastric pain 04/05/2016  . Esophageal stricture    Distal, benign  . GERD (gastroesophageal reflux disease)   . Hemorrhoids   . HTN (hypertension)    Negative renal duplex 07-22-11  . Hx of echocardiogram    a. Echo 10/11: mod LVH, EF 55-60%  . Pneumonia 07-2013  . Prostate cancer ([place]) 2000   Seed XRT  . Spondylosis, cervical, with myelopathy 11/26/2015  . 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 APPENDECTOMY  1953  . CAROTID ENDARTERECTOMY Right 01/2000  . CATARACT EXTRACTION Bilateral   . CERVICAL DISC SURGERY  8/07  . HIP ARTHROPLASTY Left 10/24/2018   Procedure: ARTHROPLASTY BIPOLAR HIP (HEMIARTHROPLASTY);  Surgeon: Paralee Cancel, MD;  Location: [facility];  Service: Orthopedics;  Laterality: Left;  . INSERTION PROSTATE RADIATION SEED  2000  . [place] SURGERY  1990s  . NASAL SINUS SURGERY     x 3     Social History:  The patient  reports that he quit smoking about 41 years ago. His smoking use included cigarettes. He has a 8.75 pack-year smoking history. His smokeless tobacco use includes chew. He reports that he does not drink alcohol or use drugs.   Family History:  The patient's family history includes Alcohol abuse in his brother; Cancer in his brother; Diabetes in his son; Gout in his son; Hyperlipidemia in his mother; Hypertension in his mother; Lung disease in his father; Obesity in his sister; Stroke in his brother, mother, and sister.    ROS:  Please see the history of present illness. All other systems are reviewed and  Negative to the above problem except as noted.    PHYSICAL EXAM: VS:  BP (!) 150/44   Pulse 60   Ht 5\' 11"  (1.803 m)   Wt 152 lb (68.9 kg)   SpO2 96%   BMI 21.20 kg/m   GEN: Thin 86 yo in no acute distress  HEENT: normal  Neck: no JVD, carotid bruits  Cardiac: RRR; no murmurs, rubs, or gallops,no LE edema  Respiratory:  Mild diffuse wheezes   No rales GI: soft, nontender, nondistended, + BS  No hepatomegaly  MS: no deformity Moving all extremities   Skin: warm and dry, no rash Neuro:  Strength and sensation are intact Psych: euthymic mood, full affect   EKG:  EKG is not ordered today.   Lipid Panel    Component Value Date/Time   CHOL 98 11/24/2018 1417   TRIG 108.0 11/24/2018 1417   HDL 36.40 (L) 11/24/2018 1417   CHOLHDL 3  11/24/2018 1417   VLDL 21.6 11/24/2018 1417   LDLCALC 40 11/24/2018 1417      Wt Readings from Last 3 Encounters:  05/11/19 152 lb (68.9 kg)  04/28/19 162 lb (73.5 kg)  12/23/18 157 lb (71.2 kg)  ASSESSMENT AND PLAN: 1  HTN   BP is OK   I would keep on same regimen     2  PAF  Continue Eliuqis  Denies palpitations     3  Reactive airway dz.   Moving air well  4  CV dz   Keep on statin  5   hL   Good control on Lipitor   Continue    Plan to check BMET, CBC, TSH and BNP today  F/U in 6 months   F/U in 3 months   Current medicines are reviewed at length with the patient today.  The patient does not have concerns regarding medicines.  Signed, [name], MD  05/11/2019 12:27 PM    [address], [place], [place]  [address] Phone: [phone]; Fax: [phone]

## 2019-05-11 ENCOUNTER — Ambulatory Visit (INDEPENDENT_AMBULATORY_CARE_PROVIDER_SITE_OTHER): Payer: PPO | Admitting: Internal Medicine

## 2019-05-11 ENCOUNTER — Other Ambulatory Visit: Payer: Self-pay

## 2019-05-11 ENCOUNTER — Encounter: Payer: Self-pay | Admitting: Internal Medicine

## 2019-05-11 VITALS — BP 150/44 | HR 60 | Ht 71.0 in | Wt 152.0 lb

## 2019-05-11 DIAGNOSIS — R0602 Shortness of breath: Secondary | ICD-10-CM

## 2019-05-11 DIAGNOSIS — I10 Essential (primary) hypertension: Secondary | ICD-10-CM | POA: Diagnosis not present

## 2019-05-11 DIAGNOSIS — I5032 Chronic diastolic (congestive) heart failure: Secondary | ICD-10-CM

## 2019-05-11 NOTE — Patient Instructions (Signed)
Medication Instructions:  No changes today *If you need a refill on your cardiac medications before your next appointment, please call your pharmacy*   Lab Work: Today: bmet, cbc, tsh, bnp If you have labs (blood work) drawn today and your tests are completely normal, you will receive your results only by: [name] Kitchen MyChart Message (if you have MyChart) OR . A paper copy in the mail If you have any lab test that is abnormal or we need to change your treatment, we will call you to review the results.   Testing/Procedures: none   Follow-Up: At [facility], you and your health needs are our priority.  As part of our continuing mission to provide you with exceptional heart care, we have created designated Provider Care Teams.  These Care Teams include your primary Cardiologist (physician) and Advanced Practice Providers (APPs -  Physician Assistants and Nurse Practitioners) who all work together to provide you with the care you need, when you need it.  Your next appointment:   6 month(s)  The format for your next appointment:   Either In Person or Virtual  Provider:   You may see [name], MD or one of the following Advanced Practice Providers on your designated Care Team:    Richardson Dopp, PA-C  Robbie Lis, [place]    Other Instructions

## 2019-05-12 ENCOUNTER — Other Ambulatory Visit: Payer: Self-pay | Admitting: Family Medicine

## 2019-05-12 DIAGNOSIS — E782 Mixed hyperlipidemia: Secondary | ICD-10-CM

## 2019-05-12 DIAGNOSIS — E559 Vitamin D deficiency, unspecified: Secondary | ICD-10-CM

## 2019-05-12 DIAGNOSIS — I6350 Cerebral infarction due to unspecified occlusion or stenosis of unspecified cerebral artery: Secondary | ICD-10-CM

## 2019-05-12 DIAGNOSIS — I10 Essential (primary) hypertension: Secondary | ICD-10-CM

## 2019-05-12 LAB — BASIC METABOLIC PANEL
BUN/Creatinine Ratio: 18 (ref 10–24)
BUN: 19 mg/dL (ref 8–27)
CO2: 26 mmol/L (ref 20–29)
Calcium: 9.1 mg/dL (ref 8.6–10.2)
Chloride: 107 mmol/L — ABNORMAL HIGH (ref 96–106)
Creatinine, Ser: 1.08 mg/dL (ref 0.76–1.27)
GFR calc Af Amer: 71 mL/min/{1.73_m2} (ref >59)
GFR calc non Af Amer: 61 mL/min/{1.73_m2} (ref >59)
Glucose: 108 mg/dL — ABNORMAL HIGH (ref 65–99)
Potassium: 5.1 mmol/L (ref 3.5–5.2)
Sodium: 143 mmol/L (ref 134–144)

## 2019-05-12 LAB — CBC
Hematocrit: 35.9 % — ABNORMAL LOW (ref 37.5–51.0)
Hemoglobin: 11.9 g/dL — ABNORMAL LOW (ref 13.0–17.7)
MCH: 29.8 pg (ref 26.6–33.0)
MCHC: 33.1 g/dL (ref 31.5–35.7)
MCV: 90 fL (ref 79–97)
Platelets: 341 10*3/uL (ref 150–450)
RBC: 3.99 x10E6/uL — ABNORMAL LOW (ref 4.14–5.80)
RDW: 14.2 % (ref 11.6–15.4)
WBC: 6.8 10*3/uL (ref 3.4–10.8)

## 2019-05-12 LAB — TSH: TSH: 3.34 u[IU]/mL (ref 0.450–4.500)

## 2019-05-12 LAB — PRO B NATRIURETIC PEPTIDE: NT-Pro BNP: 848 pg/mL — ABNORMAL HIGH (ref 0–486)

## 2019-05-12 MED FILL — METOCLOPRAMIDE 5 MG TABLET: 5 | 30 days supply | Qty: 30 | Fill #5

## 2019-05-22 ENCOUNTER — Other Ambulatory Visit: Payer: Self-pay | Admitting: Family Medicine

## 2019-05-22 NOTE — Telephone Encounter (Signed)
Last written: 04/24/19 Last ov: 04/28/19 Next ov: 05/29/19 Contract:   UDS: 01/13/18  Will update at next visit.

## 2019-05-29 ENCOUNTER — Ambulatory Visit (INDEPENDENT_AMBULATORY_CARE_PROVIDER_SITE_OTHER): Payer: PPO | Admitting: Family Medicine

## 2019-05-29 ENCOUNTER — Other Ambulatory Visit: Payer: Self-pay

## 2019-05-29 VITALS — BP 140/58 | HR 66 | Temp 98.0°F | Resp 12 | Ht 71.0 in | Wt 149.0 lb

## 2019-05-29 DIAGNOSIS — R600 Localized edema: Secondary | ICD-10-CM

## 2019-05-29 DIAGNOSIS — D62 Acute posthemorrhagic anemia: Secondary | ICD-10-CM

## 2019-05-29 DIAGNOSIS — E782 Mixed hyperlipidemia: Secondary | ICD-10-CM

## 2019-05-29 DIAGNOSIS — N179 Acute kidney failure, unspecified: Secondary | ICD-10-CM | POA: Diagnosis not present

## 2019-05-29 DIAGNOSIS — Z79899 Other long term (current) drug therapy: Secondary | ICD-10-CM | POA: Diagnosis not present

## 2019-05-29 DIAGNOSIS — M25559 Pain in unspecified hip: Secondary | ICD-10-CM | POA: Insufficient documentation

## 2019-05-29 DIAGNOSIS — I10 Essential (primary) hypertension: Secondary | ICD-10-CM | POA: Diagnosis not present

## 2019-05-29 DIAGNOSIS — E559 Vitamin D deficiency, unspecified: Secondary | ICD-10-CM

## 2019-05-29 DIAGNOSIS — E8809 Other disorders of plasma-protein metabolism, not elsewhere classified: Secondary | ICD-10-CM

## 2019-05-29 DIAGNOSIS — D649 Anemia, unspecified: Secondary | ICD-10-CM

## 2019-05-29 NOTE — Assessment & Plan Note (Signed)
Well controlled, no changes to meds. Encouraged heart healthy diet such as the DASH diet and exercise as tolerated.  °

## 2019-05-29 NOTE — Assessment & Plan Note (Signed)
Did have a fall yesterday but no severe pain, still able to ambulate. Does not feel he caused any new damage. He will report if pain worsens.

## 2019-05-29 NOTE — Assessment & Plan Note (Signed)
Tolerating statin, encouraged heart healthy diet, avoid trans fats, minimize simple carbs and saturated fats. Increase exercise as tolerated 

## 2019-05-29 NOTE — Assessment & Plan Note (Signed)
Discussed strategies for getting more protein in his diet including Ensure with protein and adding more protein powder such as yellow split pea powder. Check prealbumen level

## 2019-05-29 NOTE — Assessment & Plan Note (Signed)
Check cbc 

## 2019-05-29 NOTE — Patient Instructions (Addendum)
Encouraged increased hydration and fiber in diet. Daily probiotics. If bowels not moving can use MOM 2 tbls po in 4 oz of warm prune juice by mouth every 2-3 days. If no results then repeat in 4 hours with  Dulcolax suppository pr, may repeat again in 4 more hours as needed. Seek care if symptoms worsen. Consider daily Miralax and/or Dulcolax if symptoms persist.   Consider Mirlax and benefiber in the prune juice daily  Yellow Split Pea Powder

## 2019-05-29 NOTE — Progress Notes (Signed)
Subjective:    Patient ID: [name], male    DOB: [birth year], 86 y.o.   MRN: [id number]  Chief Complaint  Patient presents with  . 6 month follow up    HPI Patient is in today for follow up on chronic medical concerns. He had a fall yesterday while trying to help his wife up. He notes some hip pain but it is not increased above his baseline and he is still able to ambulate. He is noting pedal edema worse at the end of the day and better in the morning. No recent febrile illness or hospitalizations. He is trying to eat better but still not always eating enough protein. Denies CP/palp/HA/congestion/fevers/GI or GU c/o. Taking meds as prescribed  Past Medical History:  Diagnosis Date  . Acute bronchitis 06/10/2015  . Allergic rhinitis   . Anemia   . Asthma   . Carotid stenosis    a. s/p Left CEA 2002;  b. Carotid US 3/16:  patent R CEA, L < 40%  . Chronic pain syndrome    Left shoulder, back  . Diverticulosis   . Dyslipidemia   . Epigastric pain 04/05/2016  . Esophageal stricture    Distal, benign  . GERD (gastroesophageal reflux disease)   . Hemorrhoids   . HTN (hypertension)    Negative renal duplex 07-22-11  . Hx of echocardiogram    a. Echo 10/11: mod LVH, EF 55-60%  . Pneumonia 07-2013  . Prostate cancer ([place]) 2000   Seed XRT  . Spondylosis, cervical, with myelopathy 11/26/2015  . Stroke [facility]) 8/01   right thalamic - on chronic Plavix/ASA    Past Surgical History:  Procedure Laterality Date  . APPENDECTOMY  1953  . CAROTID ENDARTERECTOMY Right 01/2000  . CATARACT EXTRACTION Bilateral   . CERVICAL DISC SURGERY  8/07  . HIP ARTHROPLASTY Left 10/24/2018   Procedure: ARTHROPLASTY BIPOLAR HIP (HEMIARTHROPLASTY);  Surgeon: Paralee Cancel, MD;  Location: [facility];  Service: Orthopedics;  Laterality: Left;  . INSERTION PROSTATE RADIATION SEED  2000  . [place] SURGERY  1990s  . NASAL SINUS SURGERY     x 3    Family History  Problem Relation Age of Onset  . Stroke  Brother   . Stroke Sister   . Stroke Mother   . Hyperlipidemia Mother   . Hypertension Mother   . Lung disease Father   . Diabetes Son   . Gout Son   . Obesity Sister   . Alcohol abuse Brother   . Cancer Brother        lung, smoker    Social History   Socioeconomic History  . Marital status: Married    Spouse name: Not on file  . Number of children: 1  . Years of education: 52  . Highest education level: Not on file  Occupational History  . Occupation: Retired  Tobacco Use  . Smoking status: Former Smoker    Packs/day: 0.25    Years: 35.00    Pack years: 8.75    Types: Cigarettes    Quit date: 01/12/1978    Years since quitting: 41.4  . Smokeless tobacco: Current User    Types: Chew  Substance and Sexual Activity  . Alcohol use: No    Comment: Prior alcoholic, sober since 6578  . Drug use: No  . Sexual activity: Not Currently  Other Topics Concern  . Not on file  Social History Narrative   Lives at home w/ his wife  Right-handed    Caffeine: drinks a lot of coffee per report   Social Determinants of Health   Financial Resource Strain:   . Difficulty of Paying Living Expenses:   Food Insecurity:   . Worried About Charity fundraiser in the Last Year:   . Arboriculturist in the Last Year:   Transportation Needs:   . Film/video editor (Medical):   [name] Kitchen Lack of Transportation (Non-Medical):   Physical Activity:   . Days of Exercise per Week:   . Minutes of Exercise per Session:   Stress:   . Feeling of Stress :   Social Connections:   . Frequency of Communication with Friends and Family:   . Frequency of Social Gatherings with Friends and Family:   . Attends Religious Services:   . Active Member of Clubs or Organizations:   . Attends Archivist Meetings:   [name] Kitchen Marital Status:   Intimate Partner Violence:   . Fear of Current or Ex-Partner:   . Emotionally Abused:   [name] Kitchen Physically Abused:   . Sexually Abused:     Outpatient Medications Prior  to Visit  Medication Sig Dispense Refill  . acetaminophen (TYLENOL) 500 MG tablet Take 500 mg by mouth every 6 (six) hours as needed for mild pain or headache.    . albuterol (PROVENTIL HFA;VENTOLIN HFA) 108 (90 Base) MCG/ACT inhaler Inhale 1-2 puffs into the lungs every 6 (six) hours as needed for wheezing or shortness of breath. 1 Inhaler 6  . amLODipine (NORVASC) 5 MG tablet Take 2 tablets (10 mg total) by mouth daily. 180 tablet 3  . amoxicillin-clavulanate (AUGMENTIN) 875-125 MG tablet Take 1 tablet by mouth 2 (two) times daily. 20 tablet 0  . atorvastatin (LIPITOR) 40 MG tablet TAKE 1 TABLET BY MOUTH DAILY. 90 tablet 1  . cloNIDine (CATAPRES) 0.1 MG tablet Take 1 tablet (0.1 mg total) by mouth 2 (two) times daily. 90 tablet 3  . ELIQUIS 5 MG TABS tablet TAKE 1 TABLET BY MOUTH TWICE DAILY 60 tablet 5  . fluticasone (FLOVENT HFA) 110 MCG/ACT inhaler Inhale 2 puffs into the lungs 2 (two) times daily. 1 Inhaler 0  . gabapentin (NEURONTIN) 300 MG capsule TAKE 1 CAPSULE BY MOUTH 3 TIMES DAILY 270 capsule 1  . lisinopril (ZESTRIL) 2.5 MG tablet TAKE 1 TABLET (2.5 MG TOTAL) BY MOUTH DAILY. 90 tablet 1  . Menthol-Methyl Salicylate (MUSCLE RUB) 10-15 % CREA Apply 1 application topically 3 (three) times daily with meals. To BLE 85 g 0  . methylPREDNISolone (MEDROL) 4 MG tablet 5 tab po day 1, 4 tab po day 2, 3 tab po day 3, 2 tab po day 4 and 1 tab po day 5. 15 tablet 0  . metoCLOPramide (REGLAN) 5 MG tablet TAKE 1 TABLET BY MOUTH AT BEDTIME 30 tablet 5  . metoprolol succinate (TOPROL-XL) 25 MG 24 hr tablet TAKE 1/2 TABLET BY MOUTH DAILY 45 tablet 2  . minoxidil (LONITEN) 10 MG tablet Take 1 tablet (10 mg total) by mouth 2 (two) times daily. 60 tablet 6  . pantoprazole (PROTONIX) 40 MG tablet TAKE 1 TABLET BY MOUTH ONCE A DAY BEFORE BREAKFAST (NEEDS OV) 90 tablet 0  . polyethylene glycol (MIRALAX / GLYCOLAX) 17 g packet Take 17 g by mouth 2 (two) times daily. 28 packet 0  . polyvinyl alcohol  (ARTIFICIAL TEARS) 1.4 % ophthalmic solution Place 1 drop into both eyes as needed for dry eyes.     [name] Kitchen  traMADol (ULTRAM) 50 MG tablet TAKE 1 TABLET BY MOUTH 3 TIMES DAILY AS NEEDED FOR MODERATE PAIN 90 tablet 0  . triamcinolone cream (KENALOG) 0.5 % Apply 1 application topically daily as needed (for skin irritation).     . ferrous sulfate (FERROUSUL) 325 (65 FE) MG tablet Take 1 tablet (325 mg total) by mouth 3 (three) times daily with meals for 14 days. 90 tablet 2   No facility-administered medications prior to visit.    Allergies  Allergen Reactions  . Hctz [Hydrochlorothiazide] Shortness Of Breath, Swelling and Other (See Comments)    "Swelling and dyspnea"   . Hydralazine Shortness Of Breath and Other (See Comments)    Chest pain and GI issues also  . Diovan [Valsartan] Other (See Comments)    Elevated potassium- Hyperkalemia  . Metformin And Related Other (See Comments)    Reaction not recalled  . Nsaids Other (See Comments)    Other than Tylenol, he isn't to have these because he's on Eliquis  . Other Other (See Comments)    Unnamed topically-applied B/P patch = Caused redness  . Prednisone Other (See Comments)    Suicidal thoughts  . Spironolactone Swelling    Site of swelling not recalled  . Codeine Itching and Nausea Only  . Oxycodone-Acetaminophen Itching and Nausea Only    Review of Systems  Constitutional: Positive for malaise/fatigue. Negative for fever.  HENT: Negative for congestion.   Eyes: Negative for blurred vision.  Respiratory: Positive for shortness of breath.   Cardiovascular: Positive for leg swelling. Negative for chest pain and palpitations.  Gastrointestinal: Negative for abdominal pain, blood in stool and nausea.  Genitourinary: Negative for dysuria and frequency.  Musculoskeletal: Negative for falls.  Skin: Negative for rash.  Neurological: Negative for dizziness, loss of consciousness and headaches.  Endo/Heme/Allergies: Negative for  environmental allergies.  Psychiatric/Behavioral: Negative for depression. The patient is not nervous/anxious.        Objective:    Physical Exam Vitals and nursing note reviewed.  Constitutional:      General: He is not in acute distress.    Appearance: He is well-developed.  HENT:     Head: Normocephalic and atraumatic.     Nose: Nose normal.  Eyes:     General:        Right eye: No discharge.        Left eye: No discharge.  Cardiovascular:     Rate and Rhythm: Normal rate and regular rhythm.     Heart sounds: No murmur.  Pulmonary:     Effort: Pulmonary effort is normal.     Breath sounds: Normal breath sounds.  Abdominal:     General: Bowel sounds are normal.     Palpations: Abdomen is soft.     Tenderness: There is no abdominal tenderness.  Musculoskeletal:     Cervical back: Normal range of motion and neck supple.     Right lower leg: Edema present.     Left lower leg: Edema present.  Skin:    General: Skin is warm and dry.  Neurological:     Mental Status: He is alert and oriented to person, place, and time.     BP (!) 140/58 (BP Location: Left Arm, Cuff Size: Normal)   Pulse 66   Temp 98 F (36.7 C) (Temporal)   Resp 12   Ht 5\' 11"  (1.803 m)   Wt 149 lb (67.6 kg)   SpO2 97%   BMI 20.78 kg/m  Wt Readings  from Last 3 Encounters:  05/29/19 149 lb (67.6 kg)  05/11/19 152 lb (68.9 kg)  04/28/19 162 lb (73.5 kg)    Diabetic Foot Exam - Simple   No data filed     Lab Results  Component Value Date   WBC 6.8 05/11/2019   HGB 11.9 (L) 05/11/2019   HCT 35.9 (L) 05/11/2019   PLT 341 05/11/2019   GLUCOSE 108 (H) 05/11/2019   CHOL 98 11/24/2018   TRIG 108.0 11/24/2018   HDL 36.40 (L) 11/24/2018   LDLCALC 40 11/24/2018   ALT 4 11/24/2018   AST 9 11/24/2018   NA 143 05/11/2019   K 5.1 05/11/2019   CL 107 (H) 05/11/2019   CREATININE 1.08 05/11/2019   BUN 19 05/11/2019   CO2 26 05/11/2019   TSH 3.340 05/11/2019   PSA 0.00 Repeated and verified X2.  (L) 09/22/2017   INR 1.3 (H) 10/23/2018   HGBA1C 5.5 11/24/2018    Lab Results  Component Value Date   TSH 3.340 05/11/2019   Lab Results  Component Value Date   WBC 6.8 05/11/2019   HGB 11.9 (L) 05/11/2019   HCT 35.9 (L) 05/11/2019   MCV 90 05/11/2019   PLT 341 05/11/2019   Lab Results  Component Value Date   NA 143 05/11/2019   K 5.1 05/11/2019   CO2 26 05/11/2019   GLUCOSE 108 (H) 05/11/2019   BUN 19 05/11/2019   CREATININE 1.08 05/11/2019   BILITOT 0.4 11/24/2018   ALKPHOS 60 11/24/2018   AST 9 11/24/2018   ALT 4 11/24/2018   PROT 6.0 11/24/2018   ALBUMIN 3.6 11/24/2018   CALCIUM 9.1 05/11/2019   ANIONGAP 7 11/05/2018   GFR 61.35 11/24/2018   Lab Results  Component Value Date   CHOL 98 11/24/2018   Lab Results  Component Value Date   HDL 36.40 (L) 11/24/2018   Lab Results  Component Value Date   LDLCALC 40 11/24/2018   Lab Results  Component Value Date   TRIG 108.0 11/24/2018   Lab Results  Component Value Date   CHOLHDL 3 11/24/2018   Lab Results  Component Value Date   HGBA1C 5.5 11/24/2018       Assessment & Plan:   Problem List Items Addressed This Visit    Vitamin D deficiency - Primary    Supplement and monitor      HLD (hyperlipidemia)    Tolerating statin, encouraged heart healthy diet, avoid trans fats, minimize simple carbs and saturated fats. Increase exercise as tolerated      Relevant Orders   Comprehensive metabolic panel   Lipid panel   Anemia   Relevant Orders   CBC with Differential/Platelet   HYPERTENSION, BENIGN ESSENTIAL   Relevant Orders   CBC with Differential/Platelet   Comprehensive metabolic panel   TSH   Acute blood loss anemia    Check cbc      AKI (acute kidney injury) ([place])    Hydrate and check cmp      Essential hypertension    Well controlled, no changes to meds. Encouraged heart healthy diet such as the DASH diet and exercise as tolerated.       Hypoalbuminemia    Discussed strategies  for getting more protein in his diet including Ensure with protein and adding more protein powder such as yellow split pea powder. Check prealbumen level      Relevant Orders   Prealbumin   Hip pain    Did have a fall  yesterday but no severe pain, still able to ambulate. Does not feel he caused any new damage. He will report if pain worsens.       Relevant Orders   DRUG MONITORING, PANEL 8 WITH CONFIRMATION, URINE   DRUG MONITOR, TRAMADOL,QN, URINE   Pedal edema    Likely multifactorial they will work on getting anemia and low protein treated withour help. Encouraged to minimize sodium in diet. Elevate feet above heart for 15 minutes several times a day and try compression hose.        Other Visit Diagnoses    High risk medication use       Relevant Orders   DRUG MONITORING, PANEL 8 WITH CONFIRMATION, URINE   DRUG MONITOR, TRAMADOL,QN, URINE      I am having Elyn Aquas. Cadieux "Ray" maintain his triamcinolone cream, albuterol, polyvinyl alcohol, fluticasone, acetaminophen, polyethylene glycol, amLODipine, Muscle Rub, minoxidil, ferrous sulfate, metoCLOPramide, cloNIDine, lisinopril, metoprolol succinate, Eliquis, pantoprazole, methylPREDNISolone, amoxicillin-clavulanate, gabapentin, atorvastatin, and traMADol.  No orders of the defined types were placed in this encounter.    Penni Homans, MD

## 2019-05-29 NOTE — Assessment & Plan Note (Signed)
Hydrate and check cmp

## 2019-05-29 NOTE — Assessment & Plan Note (Signed)
Supplement and monitor 

## 2019-05-29 NOTE — Assessment & Plan Note (Signed)
Likely multifactorial they will work on getting anemia and low protein treated withour help. Encouraged to minimize sodium in diet. Elevate feet above heart for 15 minutes several times a day and try compression hose.

## 2019-05-30 ENCOUNTER — Other Ambulatory Visit: Payer: Self-pay | Admitting: Family Medicine

## 2019-05-30 DIAGNOSIS — E559 Vitamin D deficiency, unspecified: Secondary | ICD-10-CM

## 2019-05-30 DIAGNOSIS — D649 Anemia, unspecified: Secondary | ICD-10-CM

## 2019-05-30 DIAGNOSIS — E778 Other disorders of glycoprotein metabolism: Secondary | ICD-10-CM

## 2019-05-30 DIAGNOSIS — R531 Weakness: Secondary | ICD-10-CM

## 2019-05-30 LAB — COMPREHENSIVE METABOLIC PANEL
ALT: 5 U/L (ref 0–53)
AST: 12 U/L (ref 0–37)
Albumin: 3.8 g/dL (ref 3.5–5.2)
Alkaline Phosphatase: 51 U/L (ref 39–117)
BUN: 23 mg/dL (ref 6–23)
CO2: 28 mEq/L (ref 19–32)
Calcium: 8.8 mg/dL (ref 8.4–10.5)
Chloride: 107 mEq/L (ref 96–112)
Creatinine, Ser: 1.05 mg/dL (ref 0.40–1.50)
GFR: 66.69 mL/min (ref >60.00)
Glucose, Bld: 99 mg/dL (ref 70–99)
Potassium: 5 mEq/L (ref 3.5–5.1)
Sodium: 140 mEq/L (ref 135–145)
Total Bilirubin: 0.4 mg/dL (ref 0.2–1.2)
Total Protein: 6 g/dL (ref 6.0–8.3)

## 2019-05-30 LAB — CBC WITH DIFFERENTIAL/PLATELET
Basophils Absolute: 0 10*3/uL (ref 0.0–0.1)
Basophils Relative: 0.7 % (ref 0.0–3.0)
Eosinophils Absolute: 0.1 10*3/uL (ref 0.0–0.7)
Eosinophils Relative: 2 % (ref 0.0–5.0)
HCT: 36.4 % — ABNORMAL LOW (ref 39.0–52.0)
Hemoglobin: 12.1 g/dL — ABNORMAL LOW (ref 13.0–17.0)
Lymphocytes Relative: 16.7 % (ref 12.0–46.0)
Lymphs Abs: 1 10*3/uL (ref 0.7–4.0)
MCHC: 33.1 g/dL (ref 30.0–36.0)
MCV: 90 fl (ref 78.0–100.0)
Monocytes Absolute: 0.4 10*3/uL (ref 0.1–1.0)
Monocytes Relative: 7 % (ref 3.0–12.0)
Neutro Abs: 4.4 10*3/uL (ref 1.4–7.7)
Neutrophils Relative %: 73.6 % (ref 43.0–77.0)
Platelets: 262 10*3/uL (ref 150.0–400.0)
RBC: 4.05 Mil/uL — ABNORMAL LOW (ref 4.22–5.81)
RDW: 14.7 % (ref 11.5–15.5)
WBC: 5.9 10*3/uL (ref 4.0–10.5)

## 2019-05-30 LAB — LIPID PANEL
Cholesterol: 107 mg/dL (ref 0–200)
HDL: 40.5 mg/dL (ref >39.00)
LDL Cholesterol: 50 mg/dL (ref 0–99)
NonHDL: 66.77
Total CHOL/HDL Ratio: 3
Triglycerides: 86 mg/dL (ref 0.0–149.0)
VLDL: 17.2 mg/dL (ref 0.0–40.0)

## 2019-05-30 LAB — TSH: TSH: 2.8 u[IU]/mL (ref 0.35–4.50)

## 2019-05-31 LAB — DRUG MONITORING, PANEL 8 WITH CONFIRMATION, URINE
6 Acetylmorphine: NEGATIVE ng/mL (ref <10)
Alcohol Metabolites: NEGATIVE ng/mL
Amphetamines: NEGATIVE ng/mL (ref <500)
Benzodiazepines: NEGATIVE ng/mL (ref <100)
Buprenorphine, Urine: NEGATIVE ng/mL (ref <5)
Cocaine Metabolite: NEGATIVE ng/mL (ref <150)
Creatinine: 79.4 mg/dL
MDMA: NEGATIVE ng/mL (ref <500)
Marijuana Metabolite: NEGATIVE ng/mL (ref <20)
Opiates: NEGATIVE ng/mL (ref <100)
Oxidant: NEGATIVE ug/mL
Oxycodone: NEGATIVE ng/mL (ref <100)
pH: 5.2 (ref 4.5–9.0)

## 2019-05-31 LAB — DM TEMPLATE

## 2019-05-31 LAB — DRUG MONITOR, TRAMADOL,QN, URINE
Desmethyltramadol: 2508 ng/mL — ABNORMAL HIGH (ref <100)
Tramadol: >10000 ng/mL — ABNORMAL HIGH (ref <100)

## 2019-05-31 LAB — PREALBUMIN: Prealbumin: 14 mg/dL — ABNORMAL LOW (ref 21–43)

## 2019-06-01 MED FILL — MINOXIDIL 10 MG TABLET: 10 | 30 days supply | Qty: 60 | Fill #0

## 2019-06-07 ENCOUNTER — Telehealth: Payer: Self-pay | Admitting: Family Medicine

## 2019-06-07 ENCOUNTER — Other Ambulatory Visit: Payer: Self-pay | Admitting: Family Medicine

## 2019-06-07 DIAGNOSIS — R1013 Epigastric pain: Secondary | ICD-10-CM

## 2019-06-07 MED ORDER — FAMOTIDINE 40 MG PO TABS: 40.0000 mg | ORAL_TABLET | Freq: Every day | ORAL | 2 refills | Status: DC

## 2019-06-07 NOTE — Telephone Encounter (Signed)
I sent in some Famotidine for him to start should help with nausea and pain and then I also ordered a CBC and H Pylori blood work he will just need an appointment. After that is done we can be referred to gastroenterology if he is no better unless he wants to go sooner.

## 2019-06-07 NOTE — Telephone Encounter (Signed)
Mrs [name] is calling states they forgot to mention that Mr [name] having some trouble being nausea and upper intestinal pain after he eats. She states it's been going on for  About a month or so and wanted to know what should she do

## 2019-06-08 ENCOUNTER — Other Ambulatory Visit (INDEPENDENT_AMBULATORY_CARE_PROVIDER_SITE_OTHER): Payer: PPO

## 2019-06-08 DIAGNOSIS — R1013 Epigastric pain: Secondary | ICD-10-CM

## 2019-06-08 LAB — CBC WITH DIFFERENTIAL/PLATELET
Basophils Absolute: 0 10*3/uL (ref 0.0–0.1)
Basophils Relative: 0.7 % (ref 0.0–3.0)
Eosinophils Absolute: 0.1 10*3/uL (ref 0.0–0.7)
Eosinophils Relative: 1.9 % (ref 0.0–5.0)
HCT: 32.5 % — ABNORMAL LOW (ref 39.0–52.0)
Hemoglobin: 11.2 g/dL — ABNORMAL LOW (ref 13.0–17.0)
Lymphocytes Relative: 20.3 % (ref 12.0–46.0)
Lymphs Abs: 1.5 10*3/uL (ref 0.7–4.0)
MCHC: 34.6 g/dL (ref 30.0–36.0)
MCV: 89.3 fl (ref 78.0–100.0)
Monocytes Absolute: 0.6 10*3/uL (ref 0.1–1.0)
Monocytes Relative: 8.3 % (ref 3.0–12.0)
Neutro Abs: 5 10*3/uL (ref 1.4–7.7)
Neutrophils Relative %: 68.8 % (ref 43.0–77.0)
Platelets: 257 10*3/uL (ref 150.0–400.0)
RBC: 3.64 Mil/uL — ABNORMAL LOW (ref 4.22–5.81)
RDW: 15.4 % (ref 11.5–15.5)
WBC: 7.3 10*3/uL (ref 4.0–10.5)

## 2019-06-08 LAB — H. PYLORI ANTIBODY, IGG: H Pylori IgG: NEGATIVE

## 2019-06-08 MED FILL — FAMOTIDINE 40 MG TABS: 40 | 30 days supply | Qty: 30 | Fill #0

## 2019-06-08 NOTE — Telephone Encounter (Signed)
Spoke wife and notified her that medication has been sent in.  They will be going over to [facility] to get blood drawn.

## 2019-06-09 ENCOUNTER — Other Ambulatory Visit: Payer: Self-pay | Admitting: *Deleted

## 2019-06-09 ENCOUNTER — Telehealth: Payer: Self-pay | Admitting: *Deleted

## 2019-06-09 DIAGNOSIS — D649 Anemia, unspecified: Secondary | ICD-10-CM

## 2019-06-09 NOTE — Telephone Encounter (Signed)
-----   Message from [name], MD sent at 06/08/2019 10:16 PM EDT ----- Notify h pylori negative but anemia worse. He should complete an ifob to look for blood in stool and we can refer to gastro if they agree. Also see if lab can add an iron panel or plain iron level to blood work they already have.

## 2019-06-09 NOTE — Telephone Encounter (Signed)
We will be unable to add.  Only purple top was drawn for the cbc.  Do you want him to come back in?

## 2019-06-09 NOTE — Telephone Encounter (Signed)
No not urgent at this time

## 2019-06-13 ENCOUNTER — Telehealth: Payer: Self-pay

## 2019-06-13 NOTE — Telephone Encounter (Signed)
Left message on machine to call back  

## 2019-06-13 NOTE — Telephone Encounter (Signed)
After Hours Call:  Caller states her husband is not going to know what to do with the culture card, she wanted to know if he could bring a stool sample in.

## 2019-06-14 NOTE — Telephone Encounter (Signed)
Patient has appointment with Percell Miller tomorrow.

## 2019-06-14 NOTE — Telephone Encounter (Signed)
Wife is calling for patient, patient is having abd pain and requesting a stool sample card.

## 2019-06-14 NOTE — Telephone Encounter (Signed)
Left message on machine to call back  

## 2019-06-15 ENCOUNTER — Other Ambulatory Visit: Payer: Self-pay

## 2019-06-15 ENCOUNTER — Encounter: Payer: Self-pay | Admitting: Medical

## 2019-06-15 ENCOUNTER — Telehealth: Payer: Self-pay | Admitting: Medical

## 2019-06-15 ENCOUNTER — Ambulatory Visit (HOSPITAL_BASED_OUTPATIENT_CLINIC_OR_DEPARTMENT_OTHER)
Admission: RE | Admit: 2019-06-15 | Discharge: 2019-06-15 | Disposition: A | Discharge status: Home / Self Care | Payer: PPO | Source: Ambulatory Visit | Attending: Medical | Admitting: Medical

## 2019-06-15 ENCOUNTER — Ambulatory Visit (INDEPENDENT_AMBULATORY_CARE_PROVIDER_SITE_OTHER): Payer: PPO | Admitting: Medical

## 2019-06-15 VITALS — BP 158/70 | HR 71 | Resp 18 | Ht 71.0 in | Wt 149.6 lb

## 2019-06-15 DIAGNOSIS — J439 Emphysema, unspecified: Secondary | ICD-10-CM | POA: Diagnosis not present

## 2019-06-15 DIAGNOSIS — J90 Pleural effusion, not elsewhere classified: Secondary | ICD-10-CM | POA: Diagnosis not present

## 2019-06-15 DIAGNOSIS — R1032 Left lower quadrant pain: Secondary | ICD-10-CM | POA: Diagnosis not present

## 2019-06-15 DIAGNOSIS — R634 Abnormal weight loss: Secondary | ICD-10-CM

## 2019-06-15 DIAGNOSIS — D649 Anemia, unspecified: Secondary | ICD-10-CM | POA: Diagnosis not present

## 2019-06-15 DIAGNOSIS — R0781 Pleurodynia: Secondary | ICD-10-CM

## 2019-06-15 DIAGNOSIS — K921 Melena: Secondary | ICD-10-CM | POA: Diagnosis not present

## 2019-06-15 DIAGNOSIS — R918 Other nonspecific abnormal finding of lung field: Secondary | ICD-10-CM

## 2019-06-15 DIAGNOSIS — R110 Nausea: Secondary | ICD-10-CM | POA: Diagnosis not present

## 2019-06-15 LAB — COMPREHENSIVE METABOLIC PANEL
ALT: 7 U/L (ref 0–53)
AST: 15 U/L (ref 0–37)
Albumin: 4.1 g/dL (ref 3.5–5.2)
Alkaline Phosphatase: 53 U/L (ref 39–117)
BUN: 31 mg/dL — ABNORMAL HIGH (ref 6–23)
CO2: 30 mEq/L (ref 19–32)
Calcium: 9.3 mg/dL (ref 8.4–10.5)
Chloride: 108 mEq/L (ref 96–112)
Creatinine, Ser: 1.11 mg/dL (ref 0.40–1.50)
GFR: 62.54 mL/min (ref >60.00)
Glucose, Bld: 135 mg/dL — ABNORMAL HIGH (ref 70–99)
Potassium: 4.3 mEq/L (ref 3.5–5.1)
Sodium: 142 mEq/L (ref 135–145)
Total Bilirubin: 0.3 mg/dL (ref 0.2–1.2)
Total Protein: 6.6 g/dL (ref 6.0–8.3)

## 2019-06-15 LAB — CBC WITH DIFFERENTIAL/PLATELET
Basophils Absolute: 0.1 10*3/uL (ref 0.0–0.1)
Basophils Relative: 0.7 % (ref 0.0–3.0)
Eosinophils Absolute: 0.1 10*3/uL (ref 0.0–0.7)
Eosinophils Relative: 1.2 % (ref 0.0–5.0)
HCT: 37 % — ABNORMAL LOW (ref 39.0–52.0)
Hemoglobin: 12.4 g/dL — ABNORMAL LOW (ref 13.0–17.0)
Lymphocytes Relative: 13.9 % (ref 12.0–46.0)
Lymphs Abs: 1.2 10*3/uL (ref 0.7–4.0)
MCHC: 33.5 g/dL (ref 30.0–36.0)
MCV: 90 fl (ref 78.0–100.0)
Monocytes Absolute: 0.7 10*3/uL (ref 0.1–1.0)
Monocytes Relative: 8 % (ref 3.0–12.0)
Neutro Abs: 6.6 10*3/uL (ref 1.4–7.7)
Neutrophils Relative %: 76.2 % (ref 43.0–77.0)
Platelets: 292 10*3/uL (ref 150.0–400.0)
RBC: 4.11 Mil/uL — ABNORMAL LOW (ref 4.22–5.81)
RDW: 15.8 % — ABNORMAL HIGH (ref 11.5–15.5)
WBC: 8.6 10*3/uL (ref 4.0–10.5)

## 2019-06-15 LAB — LIPASE: Lipase: 18 U/L (ref 11.0–59.0)

## 2019-06-15 MED ORDER — ONDANSETRON 8 MG PO TBDP: 8.0000 mg | ORAL_TABLET | Freq: Three times a day (TID) | ORAL | 0 refills | Status: DC | PRN

## 2019-06-15 MED ORDER — DOXYCYCLINE HYCLATE 100 MG PO TABS: 100.0000 mg | ORAL_TABLET | Freq: Two times a day (BID) | ORAL | 0 refills | Status: DC

## 2019-06-15 MED FILL — ONDANSETRON ODT 8 MG TABLET: 8 | 7 days supply | Qty: 20 | Fill #0

## 2019-06-15 NOTE — Telephone Encounter (Signed)
Future cxr placed.

## 2019-06-15 NOTE — Progress Notes (Signed)
Subjective:    Patient ID: [name], male    DOB: 28-Oct-1929, 86 y.o.   MRN: [id number]  HPI Pt has on and off nausea sensation. Nausea is usually before and after eating. He has decreased appetite.   Pt has lost 10 lbs in a month.  Vomited one time. He states little phlem. He describes more dry heave.   Upper back pain 4 nights ago. Hurt when he was bend his spine. If straitened spine pain relief. He used heating bad and pain has been much improved. Also when he breathed deep had pain.   Pt has no chest pain, no arm pain, no shoulder pain, no jaw pain or sweating.  7 days ago. Hb and hct. 11.2 and 32.5      Review of Systems  Constitutional: Positive for fatigue. Negative for chills.  Respiratory: Negative for cough, chest tightness, shortness of breath and wheezing.   Cardiovascular: Negative for chest pain and palpitations.  Gastrointestinal: Positive for nausea. Negative for abdominal pain.  Skin: Negative for rash.  Hematological: Negative for adenopathy. Does not bruise/bleed easily.  Psychiatric/Behavioral: Negative for behavioral problems and confusion.    Past Medical History:  Diagnosis Date  . Acute bronchitis 06/10/2015  . Allergic rhinitis   . Anemia   . Asthma   . Carotid stenosis    a. s/p Left CEA 2002;  b. Carotid US 3/16:  patent R CEA, L < 40%  . Chronic pain syndrome    Left shoulder, back  . Diverticulosis   . Dyslipidemia   . Epigastric pain 04/05/2016  . Esophageal stricture    Distal, benign  . GERD (gastroesophageal reflux disease)   . Hemorrhoids   . HTN (hypertension)    Negative renal duplex 07-22-11  . Hx of echocardiogram    a. Echo 10/11: mod LVH, EF 55-60%  . Pneumonia 07-2013  . Prostate cancer ([place]) 2000   Seed XRT  . Spondylosis, cervical, with myelopathy 11/26/2015  . Stroke [facility]) 8/01   right thalamic - on chronic Plavix/ASA     Social History   Socioeconomic History  . Marital status: Married    Spouse name: Not  on file  . Number of children: 1  . Years of education: 34  . Highest education level: Not on file  Occupational History  . Occupation: Retired  Tobacco Use  . Smoking status: Former Smoker    Packs/day: 0.25    Years: 35.00    Pack years: 8.75    Types: Cigarettes    Quit date: 01/12/1978    Years since quitting: 41.4  . Smokeless tobacco: Current User    Types: Chew  Substance and Sexual Activity  . Alcohol use: No    Comment: Prior alcoholic, sober since 5093  . Drug use: No  . Sexual activity: Not Currently  Other Topics Concern  . Not on file  Social History Narrative   Lives at home w/ his wife   Right-handed    Caffeine: drinks a lot of coffee per report   Social Determinants of Health   Financial Resource Strain:   . Difficulty of Paying Living Expenses:   Food Insecurity:   . Worried About Charity fundraiser in the Last Year:   . Arboriculturist in the Last Year:   Transportation Needs:   . Film/video editor (Medical):   [name] Kitchen Lack of Transportation (Non-Medical):   Physical Activity:   . Days of Exercise per Week:   .  Minutes of Exercise per Session:   Stress:   . Feeling of Stress :   Social Connections:   . Frequency of Communication with Friends and Family:   . Frequency of Social Gatherings with Friends and Family:   . Attends Religious Services:   . Active Member of Clubs or Organizations:   . Attends Archivist Meetings:   [name] Kitchen Marital Status:   Intimate Partner Violence:   . Fear of Current or Ex-Partner:   . Emotionally Abused:   [name] Kitchen Physically Abused:   . Sexually Abused:     Past Surgical History:  Procedure Laterality Date  . APPENDECTOMY  1953  . CAROTID ENDARTERECTOMY Right 01/2000  . CATARACT EXTRACTION Bilateral   . CERVICAL DISC SURGERY  8/07  . HIP ARTHROPLASTY Left 10/24/2018   Procedure: ARTHROPLASTY BIPOLAR HIP (HEMIARTHROPLASTY);  Surgeon: Paralee Cancel, MD;  Location: [place];  Service: Orthopedics;  Laterality: Left;    . INSERTION PROSTATE RADIATION SEED  2000  . [place] SURGERY  1990s  . NASAL SINUS SURGERY     x 3    Family History  Problem Relation Age of Onset  . Stroke Brother   . Stroke Sister   . Stroke Mother   . Hyperlipidemia Mother   . Hypertension Mother   . Lung disease Father   . Diabetes Son   . Gout Son   . Obesity Sister   . Alcohol abuse Brother   . Cancer Brother        lung, smoker    Allergies  Allergen Reactions  . Hctz [Hydrochlorothiazide] Shortness Of Breath, Swelling and Other (See Comments)    "Swelling and dyspnea"   . Hydralazine Shortness Of Breath and Other (See Comments)    Chest pain and GI issues also  . Diovan [Valsartan] Other (See Comments)    Elevated potassium- Hyperkalemia  . Metformin And Related Other (See Comments)    Reaction not recalled  . Nsaids Other (See Comments)    Other than Tylenol, he isn't to have these because he's on Eliquis  . Other Other (See Comments)    Unnamed topically-applied B/P patch = Caused redness  . Prednisone Other (See Comments)    Suicidal thoughts  . Spironolactone Swelling    Site of swelling not recalled  . Codeine Itching and Nausea Only  . Oxycodone-Acetaminophen Itching and Nausea Only    Current Outpatient Medications on File Prior to Visit  Medication Sig Dispense Refill  . acetaminophen (TYLENOL) 500 MG tablet Take 500 mg by mouth every 6 (six) hours as needed for mild pain or headache.    . albuterol (PROVENTIL HFA;VENTOLIN HFA) 108 (90 Base) MCG/ACT inhaler Inhale 1-2 puffs into the lungs every 6 (six) hours as needed for wheezing or shortness of breath. 1 Inhaler 6  . amLODipine (NORVASC) 5 MG tablet Take 2 tablets (10 mg total) by mouth daily. 180 tablet 3  . atorvastatin (LIPITOR) 40 MG tablet TAKE 1 TABLET BY MOUTH DAILY. 90 tablet 1  . cloNIDine (CATAPRES) 0.1 MG tablet Take 1 tablet (0.1 mg total) by mouth 2 (two) times daily. 90 tablet 3  . ELIQUIS 5 MG TABS tablet TAKE 1 TABLET BY  MOUTH TWICE DAILY 60 tablet 5  . famotidine (PEPCID) 40 MG tablet Take 1 tablet (40 mg total) by mouth daily. 30 tablet 2  . fluticasone (FLOVENT HFA) 110 MCG/ACT inhaler Inhale 2 puffs into the lungs 2 (two) times daily. 1 Inhaler 0  . gabapentin (  NEURONTIN) 300 MG capsule TAKE 1 CAPSULE BY MOUTH 3 TIMES DAILY 270 capsule 1  . lisinopril (ZESTRIL) 2.5 MG tablet TAKE 1 TABLET (2.5 MG TOTAL) BY MOUTH DAILY. 90 tablet 1  . Menthol-Methyl Salicylate (MUSCLE RUB) 10-15 % CREA Apply 1 application topically 3 (three) times daily with meals. To BLE 85 g 0  . methylPREDNISolone (MEDROL) 4 MG tablet 5 tab po day 1, 4 tab po day 2, 3 tab po day 3, 2 tab po day 4 and 1 tab po day 5. 15 tablet 0  . metoCLOPramide (REGLAN) 5 MG tablet TAKE 1 TABLET BY MOUTH AT BEDTIME 30 tablet 5  . metoprolol succinate (TOPROL-XL) 25 MG 24 hr tablet TAKE 1/2 TABLET BY MOUTH DAILY 45 tablet 2  . minoxidil (LONITEN) 10 MG tablet Take 1 tablet (10 mg total) by mouth 2 (two) times daily. 60 tablet 6  . pantoprazole (PROTONIX) 40 MG tablet TAKE 1 TABLET BY MOUTH ONCE A DAY BEFORE BREAKFAST (NEEDS OV) 90 tablet 0  . polyethylene glycol (MIRALAX / GLYCOLAX) 17 g packet Take 17 g by mouth 2 (two) times daily. 28 packet 0  . polyvinyl alcohol (ARTIFICIAL TEARS) 1.4 % ophthalmic solution Place 1 drop into both eyes as needed for dry eyes.     . traMADol (ULTRAM) 50 MG tablet TAKE 1 TABLET BY MOUTH 3 TIMES DAILY AS NEEDED FOR MODERATE PAIN 90 tablet 0  . triamcinolone cream (KENALOG) 0.5 % Apply 1 application topically daily as needed (for skin irritation).     [name] Kitchen amoxicillin-clavulanate (AUGMENTIN) 875-125 MG tablet Take 1 tablet by mouth 2 (two) times daily. (Patient not taking: Reported on 06/15/2019) 20 tablet 0  . ferrous sulfate (FERROUSUL) 325 (65 FE) MG tablet Take 1 tablet (325 mg total) by mouth 3 (three) times daily with meals for 14 days. 90 tablet 2   No current facility-administered medications on file prior to visit.    BP  (!) 177/41 (BP Location: Right Arm, Patient Position: Sitting, Cuff Size: Normal)   Pulse 71   Resp 18   Ht 5\' 11"  (1.803 m)   Wt 149 lb 9.6 oz (67.9 kg)   SpO2 96%   BMI 20.86 kg/m       Objective:   Physical Exam   General Mental Status- Alert. General Appearance- Not in acute distress.   Skin General: Color- Normal Color. Moisture- Normal Moisture.  Neck Carotid Arteries- Normal color. Moisture- Normal Moisture. No carotid bruits. No JVD.  Chest and Lung Exam Auscultation: Breath Sounds:-Normal.  Cardiovascular Auscultation:Rythm- Regular. Murmurs & Other Heart Sounds:Auscultation of the heart reveals- No Murmurs.  Abdomen Inspection:-Inspeection Normal. Palpation/Percussion:Note:No mass. Palpation and Percussion of the abdomen reveal- mild Tender llq , Non Distended + BS, no rebound or guarding.   Neurologic Cranial Nerve exam:- CN III-XII intact(No nystagmus), symmetric smile. Strength:- 5/5 equal and symmetric strength both upper and lower extremities.     Assessment & Plan:  [phone] Maryla Morrow daughter. In with dad today.  For your recent nausea, mild left lower quadrant pain on exam and weight loss, I want you to hydrate well, eat bland foods and prescribing Zofran.  You have some anemia recently and will need to repeat blood work to make sure hemoglobin and hematocrit stable.  You have black stools today but you are also on iron.  Your Hemoccult card did turn a week positive.  We will get cbc, CMP, lipase and put in order for stat CT abdomen and pelvis.  You  have had recent pleuritic pain over the last 4 days.  Minimal today but decided go ahead and get chest x-ray as well.  If signs and symptoms worsen significantly then recommend emergency department evaluation.  We will follow labs and see if any emergency type values.  Or emergency imaging findings.  Do plan to refer you to GI MD but will wait until labs and imaging studies come back before  placing that referral.  Follow-up next week with GI or PCP.  Mackie Pai, PA-C   Time spent with patient today was 45  minutes which consisted of chart review, discussing diagnosis, work up, treatment and documentation.

## 2019-06-15 NOTE — Patient Instructions (Signed)
For your recent nausea, mild left lower quadrant pain on exam and weight loss, I want you to hydrate well, eat bland foods and prescribing Zofran.  You have some anemia recently and will need to repeat blood work to make sure hemoglobin and hematocrit stable.  You have black stools today but you are also on iron.  Your Hemoccult card did turn a week positive.  We will get cbc, CMP, lipase and put in order for stat CT abdomen and pelvis.  You have had recent pleuritic pain over the last 4 days.  Minimal today but decided go ahead and get chest x-ray as well.  If signs and symptoms worsen significantly then recommend emergency department evaluation.  We will follow labs and see if any emergency type values.  Or emergency imaging findings.  Do plan to refer you to GI MD but will wait until labs and imaging studies come back before placing that referral.  Follow-up next week with GI or PCP.

## 2019-06-16 ENCOUNTER — Other Ambulatory Visit: Payer: Self-pay

## 2019-06-16 ENCOUNTER — Ambulatory Visit (HOSPITAL_BASED_OUTPATIENT_CLINIC_OR_DEPARTMENT_OTHER)
Admission: RE | Admit: 2019-06-16 | Discharge: 2019-06-16 | Disposition: A | Discharge status: Home / Self Care | Payer: PPO | Source: Ambulatory Visit | Attending: Medical | Admitting: Medical

## 2019-06-16 DIAGNOSIS — R1032 Left lower quadrant pain: Secondary | ICD-10-CM | POA: Insufficient documentation

## 2019-06-16 LAB — H. PYLORI BREATH TEST: H. pylori Breath Test: NOT DETECTED

## 2019-06-16 MED ORDER — IOHEXOL 300 MG/ML  SOLN
100.0000 mL | Freq: Once | INTRAMUSCULAR | Status: AC | PRN
  Administered 2019-06-16: 100 mL via INTRAVENOUS

## 2019-06-16 MED ORDER — DOXYCYCLINE HYCLATE 100 MG PO TABS: 100.0000 mg | ORAL_TABLET | Freq: Two times a day (BID) | ORAL | 0 refills | Status: DC

## 2019-06-16 MED FILL — DOXYCYCLINE HYCLATE 100 MG: 100 | 10 days supply | Qty: 20 | Fill #0

## 2019-06-16 MED FILL — CloNIDine HCL 0.1 MG TAB: 0.1 | 45 days supply | Qty: 90 | Fill #0

## 2019-06-19 ENCOUNTER — Telehealth: Payer: Self-pay | Admitting: Family Medicine

## 2019-06-19 ENCOUNTER — Other Ambulatory Visit: Payer: Self-pay | Admitting: *Deleted

## 2019-06-19 DIAGNOSIS — R634 Abnormal weight loss: Secondary | ICD-10-CM

## 2019-06-19 DIAGNOSIS — R1032 Left lower quadrant pain: Secondary | ICD-10-CM

## 2019-06-19 NOTE — Progress Notes (Signed)
a 

## 2019-06-19 NOTE — Progress Notes (Signed)
°  Chronic Care Management   Note  06/19/2019 Name: MATTIA LIFORD MRN: [id number] DOB: Feb 02, 1929  Elyn Aquas Sax is a 86 y.o. year old male who is a primary care patient of [name], MD. I reached out to [name] by phone today in response to a referral sent by Mr. Salahuddin Arismendez Klaas's PCP, [name], MD.   Mr. Larch was given information about Chronic Care Management services today including:  1. CCM service includes personalized support from designated clinical staff supervised by his physician, including individualized plan of care and coordination with other care providers 2. 24/7 contact phone numbers for assistance for urgent and routine care needs. 3. Service will only be billed when office clinical staff spend 20 minutes or more in a month to coordinate care. 4. Only one practitioner may furnish and bill the service in a calendar month. 5. The patient may stop CCM services at any time (effective at the end of the month) by phone call to the office staff.   Patient wishes to consider information provided and/or speak with a member of the care team before deciding about enrollment in care management services.   This note is not being shared with the patient for the following reason: To respect privacy (The patient or proxy has requested that the information not be shared).  Follow up plan:   Earney Hamburg Upstream Scheduler

## 2019-06-20 ENCOUNTER — Other Ambulatory Visit: Payer: Self-pay | Admitting: Family Medicine

## 2019-06-20 ENCOUNTER — Other Ambulatory Visit: Payer: Self-pay | Admitting: Internal Medicine

## 2019-06-20 NOTE — Telephone Encounter (Signed)
Last written: 05/22/2019 Last ov: 05/29/2019 Next ov: 09/19/2019 Contract: 05/29/2019 UDS:. 05/29/2019

## 2019-06-22 ENCOUNTER — Other Ambulatory Visit: Payer: Self-pay | Admitting: Internal Medicine

## 2019-07-05 ENCOUNTER — Other Ambulatory Visit: Payer: Self-pay | Admitting: Internal Medicine

## 2019-07-05 ENCOUNTER — Other Ambulatory Visit: Payer: Self-pay

## 2019-07-05 MED ORDER — APIXABAN 5 MG PO TABS: 5.0000 mg | ORAL_TABLET | Freq: Two times a day (BID) | ORAL | 2 refills | Status: DC

## 2019-07-05 MED FILL — ELIQUIS 5 MG TABLET: 5 | 30 days supply | Qty: 60 | Fill #0

## 2019-07-05 MED FILL — MINOXIDIL 10 MG TABLET: 10 | 30 days supply | Qty: 60 | Fill #1

## 2019-07-05 NOTE — Telephone Encounter (Signed)
Prescription refill request for Eliquis received. Indication: A Fib Last office visit: 05/11/19 Dr Harrington Challenger Scr: 1.11 06/15/19 Age: 86  Weight: 67.9 kg

## 2019-07-10 ENCOUNTER — Ambulatory Visit (HOSPITAL_BASED_OUTPATIENT_CLINIC_OR_DEPARTMENT_OTHER)
Admission: RE | Admit: 2019-07-10 | Discharge: 2019-07-10 | Disposition: A | Discharge status: Home / Self Care | Payer: PPO | Source: Ambulatory Visit | Attending: Family Medicine | Admitting: Family Medicine

## 2019-07-10 ENCOUNTER — Other Ambulatory Visit: Payer: Self-pay

## 2019-07-10 ENCOUNTER — Ambulatory Visit (INDEPENDENT_AMBULATORY_CARE_PROVIDER_SITE_OTHER): Payer: PPO | Admitting: Family Medicine

## 2019-07-10 ENCOUNTER — Encounter: Payer: Self-pay | Admitting: Family Medicine

## 2019-07-10 VITALS — BP 120/62 | HR 64 | Temp 96.6°F | Ht 71.0 in | Wt 150.5 lb

## 2019-07-10 DIAGNOSIS — R9389 Abnormal findings on diagnostic imaging of other specified body structures: Secondary | ICD-10-CM | POA: Diagnosis not present

## 2019-07-10 DIAGNOSIS — M549 Dorsalgia, unspecified: Secondary | ICD-10-CM

## 2019-07-10 DIAGNOSIS — I517 Cardiomegaly: Secondary | ICD-10-CM | POA: Diagnosis not present

## 2019-07-10 DIAGNOSIS — R062 Wheezing: Secondary | ICD-10-CM | POA: Diagnosis not present

## 2019-07-10 DIAGNOSIS — J90 Pleural effusion, not elsewhere classified: Secondary | ICD-10-CM | POA: Diagnosis not present

## 2019-07-10 MED ORDER — METHYLPREDNISOLONE 4 MG PO TBPK: ORAL_TABLET | ORAL | 0 refills | Status: DC

## 2019-07-10 MED FILL — METHYLPREDNISOLONE 4 MG TBP: 4 | 6 days supply | Qty: 21 | Fill #0

## 2019-07-10 NOTE — Patient Instructions (Addendum)
We will check an X-ray. If it is the same or better, we will not change our plan. If it is worse, we will. Remember it can take up to 6 weeks for a chest X-ray to resolve after a pneumonia.  Heat (pad or rice pillow in microwave) over affected area, 10-15 minutes twice daily.   OK to take Tylenol 1000 mg (2 extra strength tabs) or 975 mg (3 regular strength tabs) every 6 hours as needed.  Let us know if you need anything.  Mid-Back Strain Rehab It is normal to feel mild stretching, pulling, tightness, or discomfort as you do these exercises, but you should stop right away if you feel sudden pain or your pain gets worse.  Stretching and range of motion exercises This exercise warms up your muscles and joints and improves the movement and flexibility of your back and shoulders. This exercise also help to relieve pain. Exercise A: Chest and spine stretch  1. Lie down on your back on a firm surface. 2. Roll a towel or a small blanket so it is about 4 inches (10 cm) in diameter. 3. Put the towel lengthwise under the middle of your back so it is under your spine, but not under your shoulder blades. 4. To increase the stretch, you may put your hands behind your head and let your elbows fall to your sides. 5. Hold for 30 seconds. Repeat exercise 2 times. Complete this exercise 3 times per week. Strengthening exercises These exercises build strength and endurance in your back and your shoulder blade muscles. Endurance is the ability to use your muscles for a long time, even after they get tired. Exercise C: Straight arm rows (shoulder extension) 1. Stand with your feet shoulder width apart. 2. Secure an exercise band to a stable object in front of you so the band is at or above shoulder height. 3. Hold one end of the exercise band in each hand. 4. Straighten your elbows and lift your hands up to shoulder height. 5. Step back, away from the secured end of the exercise band, until the band  stretches. 6. Squeeze your shoulder blades together and pull your hands down to the sides of your thighs. Stop when your hands are straight down by your sides. Do not let your hands go behind your body. 7. Hold for 2 seconds. 8. Slowly return to the starting position. Repeat 2 times. Complete this exercise 3 times per week. Exercise D: Shoulder external rotation, prone 1. Lie on your abdomen on a firm bed so your left / right forearm hangs over the edge of the bed and your upper arm is on the bed, straight out from your body. ? Your elbow should be bent. ? Your palm should be facing your feet. 2. If instructed, hold a 2-5 lb weight in your hand. 3. Squeeze your shoulder blade toward the middle of your back. Do not let your shoulder lift toward your ear. 4. Keep your elbow bent in an "L" shape (90 degrees) while you slowly move your forearm up toward the ceiling. Move your forearm up to the height of the bed, toward your head. ? Your upper arm should not move. ? At the top of the movement, your palm should face the floor. 5. Hold for 1 second. 6. Slowly return to the starting position and relax your muscles. Repeat 2 times. Complete this exercise 3 times per week. Exercise E: Scapular retraction and external rotation, rowing  1. Sit in a stable  chair without armrests, or stand. 2. Secure an exercise band to a stable object in front of you so it is at shoulder height. 3. Hold one end of the exercise band in each hand. 4. Bring your arms out straight in front of you. 5. Step back, away from the secured end of the exercise band, until the band stretches. 6. Pull the band backward. As you do this, bend your elbows and squeeze your shoulder blades together, but avoid letting the rest of your body move. Do not let your shoulders lift up toward your ears. 7. Stop when your elbows are at your sides or slightly behind your body. 8. Hold for 1 second1. 9. Slowly straighten your arms to return to the  starting position. Repeat 2 times. Complete this exercise 3 times per week. Posture and body mechanics  Body mechanics refers to the movements and positions of your body while you do your daily activities. Posture is part of body mechanics. Good posture and healthy body mechanics can help to relieve stress in your body's tissues and joints. Good posture means that your spine is in its natural S-curve position (your spine is neutral), your shoulders are pulled back slightly, and your head is not tipped forward. The following are general guidelines for applying improved posture and body mechanics to your everyday activities. Standing   When standing, keep your spine neutral and your feet about hip-width apart. Keep a slight bend in your knees. Your ears, shoulders, and hips should line up.  When you do a task in which you lean forward while standing in one place for a long time, place one foot up on a stable object that is 2-4 inches (5-10 cm) high, such as a footstool. This helps keep your spine neutral. Sitting   When sitting, keep your spine neutral and keep your feet flat on the floor. Use a footrest, if necessary, and keep your thighs parallel to the floor. Avoid rounding your shoulders, and avoid tilting your head forward.  When working at a desk or a computer, keep your desk at a height where your hands are slightly lower than your elbows. Slide your chair under your desk so you are close enough to maintain good posture.  When working at a computer, place your monitor at a height where you are looking straight ahead and you do not have to tilt your head forward or downward to look at the screen. Resting  When lying down and resting, avoid positions that are most painful for you.  If you have pain with activities such as sitting, bending, stooping, or squatting (flexion-based activities), lie in a position in which your body does not bend very much. For example, avoid curling up on your  side with your arms and knees near your chest (fetal position).  If you have pain with activities such as standing for a long time or reaching with your arms (extension-based activities), lie with your spine in a neutral position and bend your knees slightly. Try the following positions:  Lying on your side with a pillow between your knees.  Lying on your back with a pillow under your knees.  Lifting   When lifting objects, keep your feet at least shoulder-width apart and tighten your abdominal muscles.  Bend your knees and hips and keep your spine neutral. It is important to lift using the strength of your legs, not your back. Do not lock your knees straight out.  Always ask for help to lift heavy  or awkward objects. Make sure you discuss any questions you have with your health care provider. Document Released: 12/29/2004 Document Revised: 09/05/2015 Document Reviewed: 10/10/2014 Elsevier Interactive Patient Education  [employer].

## 2019-07-10 NOTE — Progress Notes (Signed)
Musculoskeletal Exam  Patient: [name] DOB: 1930-01-11  DOS: 07/10/2019  SUBJECTIVE:  Chief Complaint:   Chief Complaint  Patient presents with  . Pain    betwenn shoulder blades  . Wheezing    [name] is a 86 y.o.  male for evaluation and treatment of R shoulder pain. Here w wife.   Onset:  4 days ago. No inj or change in activity.  Location: R side between shoulder blades Character:  sharp  Progression of issue:  is unchanged Associated symptoms: no bruising, redness, skin changes, fevers, ST, runny/stuffy nose, swelling; +wheezing, intermittent coughing Treatment: to date has been acetaminophen. Albuterol helped.  Neurovascular symptoms: no  Past Medical History:  Diagnosis Date  . Acute bronchitis 06/10/2015  . Allergic rhinitis   . Anemia   . Asthma   . Carotid stenosis    a. s/p Left CEA 2002;  b. Carotid US 3/16:  patent R CEA, L < 40%  . Chronic pain syndrome    Left shoulder, back  . Diverticulosis   . Dyslipidemia   . Epigastric pain 04/05/2016  . Esophageal stricture    Distal, benign  . GERD (gastroesophageal reflux disease)   . Hemorrhoids   . HTN (hypertension)    Negative renal duplex 07-22-11  . Hx of echocardiogram    a. Echo 10/11: mod LVH, EF 55-60%  . Prostate cancer ([place]) 2000   Seed XRT  . Spondylosis, cervical, with myelopathy 11/26/2015  . Stroke ([place]) 8/01   right thalamic - on chronic Plavix/ASA    Objective: VITAL SIGNS: BP 120/62 (BP Location: Left Arm, Patient Position: Sitting, Cuff Size: Normal)   Pulse 64   Temp (!) 96.6 F (35.9 C) (Temporal)   Ht 5\' 11"  (1.803 m)   Wt 150 lb 8 oz (68.3 kg)   SpO2 93%   BMI 20.99 kg/m  Constitutional: Well formed, well developed. No acute distress. Cardiovascular: RRR Thorax & Lungs: CTAB. No accessory muscle use Musculoskeletal: mid back.   Baseline ROM of UE's Tenderness to palpation: yes Deformity: no Ecchymosis: no HEENT: Ears neg, nares patent w/o rhinorrhea, MMM  without exudate or erythema Skin: There is a raised and ulcerated lesion on the R outer nose area.  Psychiatric: Normal mood. Age appropriate judgment and insight. Alert & oriented x 3.    Assessment:  Wheezing - Plan: methylPREDNISolone (MEDROL DOSEPAK) 4 MG TBPK tablet, DG Chest 2 View  Mid back pain - Plan: methylPREDNISolone (MEDROL DOSEPAK) 4 MG TBPK tablet  Plan: Orders as above. Pt has done well on this before. Heat, ice, Tylenol, stretches/exercises. CXR for reassurance and to ensure no worsening. I did inform him that it can take up to 6 weeks for radiograph changes to resolve and if it is the same as earlier in the month, we will not deviate from our plan.  F/u prn. The patient and his wife voiced understanding and agreement to the plan.   [place], DO 07/10/19  4:20 PM

## 2019-07-11 ENCOUNTER — Ambulatory Visit: Payer: PPO | Admitting: Medical

## 2019-07-11 ENCOUNTER — Other Ambulatory Visit: Payer: Self-pay | Admitting: Family Medicine

## 2019-07-11 DIAGNOSIS — J90 Pleural effusion, not elsewhere classified: Secondary | ICD-10-CM

## 2019-07-11 MED ORDER — AMOXICILLIN-POT CLAVULANATE 875-125 MG PO TABS
1.0000 | ORAL_TABLET | Freq: Two times a day (BID) | ORAL | 0 refills | Status: DC
Start: 2019-07-11 — End: 2019-11-13

## 2019-07-11 MED FILL — AMOX-CLAV 875-125 MG TABLET: 875-125 | 10 days supply | Qty: 20 | Fill #0

## 2019-07-11 NOTE — Progress Notes (Signed)
done

## 2019-07-12 ENCOUNTER — Ambulatory Visit: Payer: PPO | Admitting: Medical

## 2019-07-13 ENCOUNTER — Ambulatory Visit: Payer: PPO | Admitting: Physician Assistant

## 2019-07-13 DIAGNOSIS — Z85828 Personal history of other malignant neoplasm of skin: Secondary | ICD-10-CM | POA: Diagnosis not present

## 2019-07-13 DIAGNOSIS — C44311 Basal cell carcinoma of skin of nose: Secondary | ICD-10-CM | POA: Diagnosis not present

## 2019-07-18 ENCOUNTER — Other Ambulatory Visit: Payer: Self-pay | Admitting: Family Medicine

## 2019-07-18 ENCOUNTER — Other Ambulatory Visit: Payer: Self-pay | Admitting: Internal Medicine

## 2019-07-18 NOTE — Telephone Encounter (Signed)
Please advise Sir, thank you. 

## 2019-07-19 MED FILL — traMADol HCL 50 MG TABS: 50 | 30 days supply | Qty: 90 | Fill #0

## 2019-07-19 MED FILL — METOCLOPRAMIDE 5 MG TABLET: 5 | 30 days supply | Qty: 30 | Fill #0

## 2019-07-20 ENCOUNTER — Telehealth: Payer: Self-pay

## 2019-07-20 NOTE — Telephone Encounter (Signed)
Patient is requesting an chest xray to see if the patient still has pneumonia

## 2019-07-20 NOTE — Telephone Encounter (Signed)
If he is symptomatically better we should wait 1-2 more weeks before we recheck the xray so we can really prove resolution. It takes awhile to look better even when the infection is gone.

## 2019-07-21 NOTE — Telephone Encounter (Signed)
Patient stated that he stills weak and tired.  He stated that he will call back in about 1-2 weeks to let us know how he is feeling.

## 2019-07-25 MED FILL — CloNIDine HCL 0.1 MG TAB: 0.1 | 45 days supply | Qty: 90 | Fill #1

## 2019-08-03 MED FILL — MINOXIDIL 10 MG TABLET: 10 | 30 days supply | Qty: 60 | Fill #2

## 2019-08-03 MED FILL — ELIQUIS 5 MG TABLET: 5 | 30 days supply | Qty: 60 | Fill #1

## 2019-08-04 ENCOUNTER — Other Ambulatory Visit: Payer: Self-pay

## 2019-08-04 ENCOUNTER — Ambulatory Visit (HOSPITAL_BASED_OUTPATIENT_CLINIC_OR_DEPARTMENT_OTHER)
Admission: RE | Admit: 2019-08-04 | Discharge: 2019-08-04 | Disposition: A | Discharge status: Home / Self Care | Payer: PPO | Source: Ambulatory Visit | Attending: Medical | Admitting: Medical

## 2019-08-04 DIAGNOSIS — R918 Other nonspecific abnormal finding of lung field: Secondary | ICD-10-CM | POA: Insufficient documentation

## 2019-08-04 DIAGNOSIS — J90 Pleural effusion, not elsewhere classified: Secondary | ICD-10-CM | POA: Diagnosis not present

## 2019-08-04 DIAGNOSIS — J9811 Atelectasis: Secondary | ICD-10-CM | POA: Diagnosis not present

## 2019-08-07 DIAGNOSIS — C44311 Basal cell carcinoma of skin of nose: Secondary | ICD-10-CM | POA: Diagnosis not present

## 2019-08-07 DIAGNOSIS — Z85828 Personal history of other malignant neoplasm of skin: Secondary | ICD-10-CM | POA: Diagnosis not present

## 2019-08-07 MED FILL — DOXYCYCLINE HYC 100 MG CAPS: 100 | 5 days supply | Qty: 10 | Fill #0

## 2019-08-09 ENCOUNTER — Other Ambulatory Visit: Payer: Self-pay | Admitting: Family Medicine

## 2019-08-09 MED FILL — LISINOPRIL 2.5 MG TABLET: 2.5 | 90 days supply | Qty: 90 | Fill #0

## 2019-08-09 MED FILL — FAMOTIDINE 40 MG TABS: 40 | 30 days supply | Qty: 30 | Fill #1

## 2019-08-09 MED FILL — ATORVASTATIN CALCIUM 40 MG: 40 | 90 days supply | Qty: 90 | Fill #0

## 2019-08-10 ENCOUNTER — Other Ambulatory Visit: Payer: Self-pay | Admitting: Family Medicine

## 2019-08-10 ENCOUNTER — Telehealth: Payer: Self-pay | Admitting: Family Medicine

## 2019-08-10 DIAGNOSIS — J9811 Atelectasis: Secondary | ICD-10-CM

## 2019-08-10 DIAGNOSIS — J90 Pleural effusion, not elsewhere classified: Secondary | ICD-10-CM

## 2019-08-10 NOTE — Telephone Encounter (Signed)
Ordered CT

## 2019-08-10 NOTE — Telephone Encounter (Signed)
Looks like CT order was never placed and it look like they wanted to proceed.

## 2019-08-10 NOTE — Telephone Encounter (Signed)
New message:   Pt's spouse is calling and states CT Scan order was supposed to be put in for the pt and it never was. [name] states he has fluid in the lungs. Please advise.

## 2019-08-11 NOTE — Telephone Encounter (Signed)
Patient wife notified that ct has been ordered.

## 2019-08-14 ENCOUNTER — Telehealth: Payer: Self-pay | Admitting: Family Medicine

## 2019-08-14 DIAGNOSIS — L570 Actinic keratosis: Secondary | ICD-10-CM | POA: Diagnosis not present

## 2019-08-14 NOTE — Telephone Encounter (Signed)
New message:    Pt's spouse is calling and states they are unable to get the pt in until 08/29/19 and is very upset due to him having pneumonia. She states she would like for him to have a sooner appt. Please advise.

## 2019-08-15 ENCOUNTER — Telehealth: Payer: Self-pay | Admitting: Family Medicine

## 2019-08-15 NOTE — Telephone Encounter (Signed)
Patient's daughter is calling in regards to CT Scan.  Requesting a call back

## 2019-08-15 NOTE — Telephone Encounter (Signed)
Patient wife would like to get CT scan done sooner and they are willing to somewhere else.  They have him down 8/17 at [facility].  Can we see if someone has something sooner?

## 2019-08-15 NOTE — Telephone Encounter (Signed)
Left detailed message on machine with information.

## 2019-08-15 NOTE — Telephone Encounter (Signed)
Advised her that pt was scheduled for Monday.  She asked why was this being done and I advised her that Dr. Charlett Blake wanted this done before he see pulmonology. I asked have they heard from pulmonology and she stated that they have not.

## 2019-08-15 NOTE — Telephone Encounter (Signed)
They do have to call [facility]  [phone]

## 2019-08-15 NOTE — Telephone Encounter (Signed)
Do patients still have to call [facility] to schedule appointment or do they have someone to call patients now?  They have not heard anything from referral that was placed on 07/05/19.

## 2019-08-15 NOTE — Telephone Encounter (Signed)
Called [facility] scheduling s/w [name] she was able to move CT to [place] on  Monday  08/21/19 at  10:00am with an Arrival time of 9:45am [name] was contacted.

## 2019-08-16 ENCOUNTER — Other Ambulatory Visit: Payer: Self-pay | Admitting: Family Medicine

## 2019-08-16 MED FILL — traMADol HCL 50 MG TABS: 50 | 30 days supply | Qty: 90 | Fill #0

## 2019-08-21 ENCOUNTER — Ambulatory Visit (HOSPITAL_BASED_OUTPATIENT_CLINIC_OR_DEPARTMENT_OTHER)
Admission: RE | Admit: 2019-08-21 | Discharge: 2019-08-21 | Disposition: A | Discharge status: Home / Self Care | Payer: PPO | Source: Ambulatory Visit | Attending: Family Medicine | Admitting: Family Medicine

## 2019-08-21 ENCOUNTER — Other Ambulatory Visit: Payer: Self-pay

## 2019-08-21 DIAGNOSIS — J90 Pleural effusion, not elsewhere classified: Secondary | ICD-10-CM | POA: Diagnosis not present

## 2019-08-21 DIAGNOSIS — I2510 Atherosclerotic heart disease of native coronary artery without angina pectoris: Secondary | ICD-10-CM | POA: Diagnosis not present

## 2019-08-21 DIAGNOSIS — I700 Atherosclerosis of aorta: Secondary | ICD-10-CM | POA: Diagnosis not present

## 2019-08-21 DIAGNOSIS — J9811 Atelectasis: Secondary | ICD-10-CM | POA: Diagnosis not present

## 2019-08-22 ENCOUNTER — Telehealth: Payer: Self-pay | Admitting: Family Medicine

## 2019-08-22 NOTE — Progress Notes (Signed)
°  Chronic Care Management   Note  08/22/2019 Name: [name] MRN: [id number] DOB: 01-11-1930  [name] is a 86 y.o. year old male who is a primary care patient of [name], MD. I reached out to [name] by phone today in response to a referral sent by [name] PCP, [name], MD.   [name] was given information about Chronic Care Management services today including:  1. CCM service includes personalized support from designated clinical staff supervised by his physician, including individualized plan of care and coordination with other care providers 2. 24/7 contact phone numbers for assistance for urgent and routine care needs. 3. Service will only be billed when office clinical staff spend 20 minutes or more in a month to coordinate care. 4. Only one practitioner may furnish and bill the service in a calendar month. 5. The patient may stop CCM services at any time (effective at the end of the month) by phone call to the office staff.   Patient agreed to services and verbal consent obtained.   Follow up plan:   Carley Perdue [employer] Scheduler

## 2019-08-23 NOTE — Telephone Encounter (Signed)
Called [facility] s/w Lavella Lemons the earlier appt was 09/21/19 10:30am. She stated that the patient could call to see if they have any cancellation any day. I called left Mrs Handler a detailed message with phone number to [facility].

## 2019-08-23 NOTE — Telephone Encounter (Signed)
Pts wife was given results by me and by Jarrett Soho ([place]). We are not confidant she understands. Wife was told he needs to see pulmonary. Sherri- please call pulmonary and see how quickly he can get in. There is a referral place.

## 2019-08-29 ENCOUNTER — Other Ambulatory Visit: Payer: Self-pay | Admitting: Internal Medicine

## 2019-08-29 ENCOUNTER — Ambulatory Visit (HOSPITAL_COMMUNITY): Payer: PPO

## 2019-08-29 ENCOUNTER — Ambulatory Visit: Payer: PPO | Admitting: Internal Medicine

## 2019-08-29 NOTE — Telephone Encounter (Signed)
Coming in today Sir.

## 2019-08-30 NOTE — Telephone Encounter (Signed)
Spoke with his daughter Maryla Morrow. I told her Dad missed his appointment yesterday. She said Mom is in the hospital with a DVT and a broken ankle. Manuela Schwartz didn't know Dad had an appointment. They will call back and r/s. May I go ahead and refill the reglan to cover him Sir?

## 2019-09-01 MED FILL — METOCLOPRAMIDE 5 MG TABLET: 5 | 30 days supply | Qty: 30 | Fill #0

## 2019-09-08 ENCOUNTER — Other Ambulatory Visit: Payer: Self-pay | Admitting: Internal Medicine

## 2019-09-08 MED FILL — ELIQUIS 5 MG TABLET: 5 | 30 days supply | Qty: 60 | Fill #2

## 2019-09-08 MED FILL — CloNIDine HCL 0.1 MG TAB: 0.1 | 45 days supply | Qty: 90 | Fill #2

## 2019-09-08 MED FILL — MINOXIDIL 10 MG TABLET: 10 | 90 days supply | Qty: 180 | Fill #0

## 2019-09-12 ENCOUNTER — Telehealth: Payer: Self-pay | Admitting: Pharmacist

## 2019-09-12 ENCOUNTER — Other Ambulatory Visit: Payer: Self-pay | Admitting: Family Medicine

## 2019-09-12 MED FILL — METOPROLOL SUCCINATE ER 25: 25 | 90 days supply | Qty: 45 | Fill #2

## 2019-09-12 NOTE — Progress Notes (Addendum)
Chronic Care Management Pharmacy Assistant   Name: [name]  MRN: [id number] DOB: 05/13/1929  Reason for Encounter: Initial Questions   PCP : [name], MD  Allergies:   Allergies  Allergen Reactions  . Hctz [Hydrochlorothiazide] Shortness Of Breath, Swelling and Other (See Comments)    "Swelling and dyspnea"   . Hydralazine Shortness Of Breath and Other (See Comments)    Chest pain and GI issues also  . Diovan [Valsartan] Other (See Comments)    Elevated potassium- Hyperkalemia  . Metformin And Related Other (See Comments)    Reaction not recalled  . Nsaids Other (See Comments)    Other than Tylenol, he isn't to have these because he's on Eliquis  . Other Other (See Comments)    Unnamed topically-applied B/P patch = Caused redness  . Prednisone Other (See Comments)    Suicidal thoughts  . Spironolactone Swelling    Site of swelling not recalled  . Codeine Itching and Nausea Only  . Oxycodone-Acetaminophen Itching and Nausea Only    Medications: Outpatient Encounter Medications as of 09/12/2019  Medication Sig  . traMADol (ULTRAM) 50 MG tablet TAKE 1 TABLET BY MOUTH THREE TIMES DAILY AS NEEDED FOR MODERATE PAIN  . acetaminophen (TYLENOL) 500 MG tablet Take 500 mg by mouth every 6 (six) hours as needed for mild pain or headache.  . albuterol (PROVENTIL HFA;VENTOLIN HFA) 108 (90 Base) MCG/ACT inhaler Inhale 1-2 puffs into the lungs every 6 (six) hours as needed for wheezing or shortness of breath.  [name] Kitchen amLODipine (NORVASC) 5 MG tablet Take 2 tablets (10 mg total) by mouth daily.  [name] Kitchen amoxicillin-clavulanate (AUGMENTIN) 875-125 MG tablet Take 1 tablet by mouth 2 (two) times daily.  [name] Kitchen apixaban (ELIQUIS) 5 MG TABS tablet Take 1 tablet (5 mg total) by mouth 2 (two) times daily.  [name] Kitchen atorvastatin (LIPITOR) 40 MG tablet TAKE 1 TABLET BY MOUTH DAILY.  . cloNIDine (CATAPRES) 0.1 MG tablet Take 1 tablet (0.1 mg total) by mouth 2 (two) times daily.  . famotidine (PEPCID) 40 MG  tablet Take 1 tablet (40 mg total) by mouth daily.  . ferrous sulfate (FERROUSUL) 325 (65 FE) MG tablet Take 1 tablet (325 mg total) by mouth 3 (three) times daily with meals for 14 days.  . fluticasone (FLOVENT HFA) 110 MCG/ACT inhaler Inhale 2 puffs into the lungs 2 (two) times daily.  [name] Kitchen gabapentin (NEURONTIN) 300 MG capsule TAKE 1 CAPSULE BY MOUTH 3 TIMES DAILY  . lisinopril (ZESTRIL) 2.5 MG tablet TAKE 1 TABLET BY MOUTH DAILY.  [name] Kitchen Menthol-Methyl Salicylate (MUSCLE RUB) 10-15 % CREA Apply 1 application topically 3 (three) times daily with meals. To BLE  . methylPREDNISolone (MEDROL DOSEPAK) 4 MG TBPK tablet Follow instructions on package.  . metoCLOPramide (REGLAN) 5 MG tablet TAKE 1 TABLET BY MOUTH AT BEDTIME  . metoprolol succinate (TOPROL-XL) 25 MG 24 hr tablet TAKE 1/2 TABLET BY MOUTH DAILY  . minoxidil (LONITEN) 10 MG tablet TAKE 1 TABLET BY MOUTH TWICE DAILY  . ondansetron (ZOFRAN ODT) 8 MG disintegrating tablet Take 1 tablet (8 mg total) by mouth every 8 (eight) hours as needed for nausea or vomiting.  . pantoprazole (PROTONIX) 40 MG tablet TAKE 1 TABLET BY MOUTH ONCE A DAY BEFORE BREAKFAST (NEEDS OV)  . polyethylene glycol (MIRALAX / GLYCOLAX) 17 g packet Take 17 g by mouth 2 (two) times daily.  . polyvinyl alcohol (ARTIFICIAL TEARS) 1.4 % ophthalmic solution Place 1 drop into both eyes as needed for  dry eyes.   [name] Kitchen triamcinolone cream (KENALOG) 0.5 % Apply 1 application topically daily as needed (for skin irritation).    No facility-administered encounter medications on file as of 09/12/2019.    Current Diagnosis: Patient Active Problem List   Diagnosis Date Noted  . Hypoalbuminemia 05/29/2019  . Hip pain 05/29/2019  . Pedal edema 05/29/2019  . Controlled type 2 diabetes mellitus with hyperglycemia, without long-term current use of insulin ([place])   . Labile blood glucose   . Postoperative pain   . Closed left hip fracture, sequela 10/29/2018  . Acute blood loss anemia   . AKI  (acute kidney injury) ([place])   . Diabetic peripheral neuropathy ([place])   . Essential hypertension   . Chronic diastolic congestive heart failure ([place])   . History of left hip hemiarthroplasty 10/28/2018  . Femur fracture, left ([place]) 10/27/2018  . Hip fracture ([place]) 10/23/2018  . Hip fracture, left ([place]) 10/23/2018  . Ankle pain 01/13/2018  . Neck pain 01/13/2018  . Constipation 01/11/2018  . Low back pain 01/11/2018  . Dysuria 12/06/2016  . Weakness 12/06/2016  . 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 Preventative health care 12/06/2016  . Epigastric pain 04/05/2016  . Spondylosis, cervical, with myelopathy 11/26/2015  . Cervicogenic headache 11/26/2015  . Umbilical hernia 41/32/4401  . Asthma 10/08/2011  . Carotid stenosis 03/31/2011  . Chronic pain   . Vitamin D deficiency 12/11/2008  . HLD (hyperlipidemia) 12/11/2008  . Anemia 12/11/2008  . Cerebral artery occlusion with cerebral infarction ([place]) 12/11/2008  . URINARY INCONTINENCE, MALE 12/11/2008  . Disorder of bursae and tendons in shoulder region 12/29/2007  . ANXIETY, MILD 08/09/2007  . HYPERTENSION, BENIGN ESSENTIAL 01/04/2007  . Allergic rhinitis 01/04/2007  . HEADACHE, TENSION 11/05/2006  . PROSTATE CANCER, HX OF 11/05/2006    Goals Addressed   None     Contacted patient to review initial questions with patient. The patient was unavailable to speak with me. The patient's wife stated she had an appointment with Dr. Charlett Blake at 2:20 pm, and the patient would not be able to make his appointment at 2pm. Patient's wife stated she would like to reschedule his appointment when she is at the office with Dr. Charlett Blake.  Follow-Up:  Pharmacist Review   Fanny Skates, [facility] Pharmacist Assistant [phone]

## 2019-09-14 ENCOUNTER — Ambulatory Visit: Payer: PPO

## 2019-09-14 MED FILL — traMADol HCL 50 MG TABS: 50 | 30 days supply | Qty: 90 | Fill #0

## 2019-09-19 ENCOUNTER — Ambulatory Visit: Payer: PPO | Admitting: Family Medicine

## 2019-09-21 ENCOUNTER — Institutional Professional Consult (permissible substitution): Payer: PPO | Admitting: Pulmonary Disease

## 2019-09-22 ENCOUNTER — Other Ambulatory Visit: Payer: Self-pay | Admitting: Internal Medicine

## 2019-09-22 MED FILL — AMLODIPINE BESYLATE 5 MG TA: 5 | 90 days supply | Qty: 180 | Fill #1

## 2019-09-26 DIAGNOSIS — R05 Cough: Secondary | ICD-10-CM | POA: Diagnosis not present

## 2019-09-26 DIAGNOSIS — Z20828 Contact with and (suspected) exposure to other viral communicable diseases: Secondary | ICD-10-CM | POA: Diagnosis not present

## 2019-09-27 MED FILL — METOCLOPRAMIDE 5 MG TABLET: 5 | 30 days supply | Qty: 30 | Fill #0

## 2019-10-02 ENCOUNTER — Institutional Professional Consult (permissible substitution): Payer: PPO | Admitting: Pulmonary Disease

## 2019-10-09 ENCOUNTER — Other Ambulatory Visit: Payer: Self-pay | Admitting: Family Medicine

## 2019-10-09 MED FILL — ELIQUIS 5 MG TABLET: 5 | 30 days supply | Qty: 60 | Fill #3

## 2019-10-12 MED FILL — traMADol HCL 50 MG TABS: 50 | 30 days supply | Qty: 90 | Fill #0

## 2019-10-14 ENCOUNTER — Other Ambulatory Visit: Payer: Self-pay | Admitting: Internal Medicine

## 2019-10-16 ENCOUNTER — Other Ambulatory Visit: Payer: Self-pay | Admitting: Internal Medicine

## 2019-10-16 MED FILL — PANTOPRAZOLE SOD DR 40 MG T: 40 | 90 days supply | Qty: 90 | Fill #0

## 2019-10-27 ENCOUNTER — Other Ambulatory Visit: Payer: Self-pay | Admitting: Internal Medicine

## 2019-10-27 MED FILL — CloNIDine HCL 0.1 MG TAB: 0.1 | 45 days supply | Qty: 90 | Fill #3

## 2019-10-30 ENCOUNTER — Telehealth: Payer: Self-pay | Admitting: Internal Medicine

## 2019-10-30 ENCOUNTER — Other Ambulatory Visit: Payer: Self-pay | Admitting: Internal Medicine

## 2019-10-30 MED ORDER — METOCLOPRAMIDE HCL 5 MG PO TABS: 5.0000 mg | ORAL_TABLET | Freq: Every day | ORAL | 0 refills | Status: DC

## 2019-10-30 MED FILL — METOCLOPRAMIDE 5 MG TABLET: 5 | 30 days supply | Qty: 30 | Fill #0

## 2019-10-30 NOTE — Telephone Encounter (Signed)
Please advise Sir, thank you. 

## 2019-10-30 NOTE — Telephone Encounter (Signed)
I cannot keep refilling this without a visit so I am denying it perhaps primary care would be willing to fill this for him

## 2019-10-30 NOTE — Telephone Encounter (Signed)
I spoke with his wife [name]. An appointment has been made for [name] on November 19th at 2:30pm. I told her how important it is to keep this appointment. I refilled his reglan to cover until his appointment, okay by Dr Carlean Purl.

## 2019-11-07 ENCOUNTER — Other Ambulatory Visit: Payer: Self-pay | Admitting: Family Medicine

## 2019-11-07 MED FILL — ELIQUIS 5 MG TABLET: 5 | 30 days supply | Qty: 60 | Fill #4

## 2019-11-07 MED FILL — ATORVASTATIN 40 MG TABLET: 40 | 90 days supply | Qty: 90 | Fill #1

## 2019-11-07 MED FILL — LISINOPRIL 2.5 MG TABLET: 2.5 | 90 days supply | Qty: 90 | Fill #1

## 2019-11-07 NOTE — Telephone Encounter (Signed)
Requesting:Tramadol Contract:01/13/18 UDS:01/13/18 Last Visit:09/19/19 Next Visit:11/25/19 Last Refill:10/09/19  Please Advise

## 2019-11-09 MED FILL — traMADol HCL 50 MG TABS: 50 | 30 days supply | Qty: 90 | Fill #0

## 2019-11-13 ENCOUNTER — Telehealth: Payer: Self-pay | Admitting: Family Medicine

## 2019-11-13 ENCOUNTER — Other Ambulatory Visit: Payer: Self-pay

## 2019-11-13 ENCOUNTER — Encounter (HOSPITAL_COMMUNITY): Payer: Self-pay | Admitting: Emergency Medicine

## 2019-11-13 ENCOUNTER — Ambulatory Visit (HOSPITAL_COMMUNITY)
Admission: EM | Admit: 2019-11-13 | Discharge: 2019-11-13 | Disposition: A | Discharge status: Home / Self Care | Payer: PPO | Attending: Family Medicine | Admitting: Family Medicine

## 2019-11-13 DIAGNOSIS — H8110 Benign paroxysmal vertigo, unspecified ear: Secondary | ICD-10-CM | POA: Diagnosis not present

## 2019-11-13 DIAGNOSIS — L03116 Cellulitis of left lower limb: Secondary | ICD-10-CM

## 2019-11-13 MED ORDER — DOXYCYCLINE HYCLATE 100 MG PO TABS: 100.0000 mg | ORAL_TABLET | Freq: Two times a day (BID) | ORAL | 0 refills | Status: DC

## 2019-11-13 NOTE — ED Provider Notes (Signed)
[facility]    CSN: [id number] Arrival date & time: 11/13/19  1611      History   Chief Complaint Chief Complaint  Patient presents with  . Cellulitis    HPI [name] is a 86 y.o. male.   Patient presenting today with wife for evaluation of several days of a sore on lower left leg and now woke up this morning with 102 degree fever, disorientation and significant leg pain. Wife notes the leg was swollen red nearly up to knee. Gave tylenol and his regular medications and sxs seemed to calm down some and he is back to baseline cognition now per pt and wife and redness has come down some. Denies sweats, body aches, cough, SOB, dysuria.  Also notes dizziness when laying on left side, room spinning sensation. Has been treated by ENT in the past for vertigo and states this feels similar. Denies visual changes, headache, N/V, syncope.      Past Medical History:  Diagnosis Date  . Acute bronchitis 06/10/2015  . Allergic rhinitis   . Anemia   . Asthma   . Carotid stenosis    a. s/p Left CEA 2002;  b. Carotid US 3/16:  patent R CEA, L < 40%  . Chronic pain syndrome    Left shoulder, back  . Diverticulosis   . Dyslipidemia   . Epigastric pain 04/05/2016  . Esophageal stricture    Distal, benign  . GERD (gastroesophageal reflux disease)   . Hemorrhoids   . HTN (hypertension)    Negative renal duplex 07-22-11  . Hx of echocardiogram    a. Echo 10/11: mod LVH, EF 55-60%  . Prostate cancer ([place]) 2000   Seed XRT  . Spondylosis, cervical, with myelopathy 11/26/2015  . Stroke [facility]) 8/01   right thalamic - on chronic Plavix/ASA    Patient Active Problem List   Diagnosis Date Noted  . Hypoalbuminemia 05/29/2019  . Hip pain 05/29/2019  . Pedal edema 05/29/2019  . Controlled type 2 diabetes mellitus with hyperglycemia, without long-term current use of insulin ([place])   . Labile blood glucose   . Postoperative pain   . Closed left hip fracture, sequela 10/29/2018  .  Acute blood loss anemia   . AKI (acute kidney injury) ([place])   . Diabetic peripheral neuropathy ([place])   . Essential hypertension   . Chronic diastolic congestive heart failure ([place])   . History of left hip hemiarthroplasty 10/28/2018  . Femur fracture, left ([place]) 10/27/2018  . Hip fracture ([place]) 10/23/2018  . Hip fracture, left ([place]) 10/23/2018  . Ankle pain 01/13/2018  . Neck pain 01/13/2018  . Constipation 01/11/2018  . Low back pain 01/11/2018  . Dysuria 12/06/2016  . Weakness 12/06/2016  . 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 Preventative health care 12/06/2016  . Epigastric pain 04/05/2016  . Spondylosis, cervical, with myelopathy 11/26/2015  . Cervicogenic headache 11/26/2015  . Umbilical hernia 92/11/9415  . Asthma 10/08/2011  . Carotid stenosis 03/31/2011  . Chronic pain   . Vitamin D deficiency 12/11/2008  . HLD (hyperlipidemia) 12/11/2008  . Anemia 12/11/2008  . Cerebral artery occlusion with cerebral infarction ([place]) 12/11/2008  . URINARY INCONTINENCE, MALE 12/11/2008  . Disorder of bursae and tendons in shoulder region 12/29/2007  . ANXIETY, MILD 08/09/2007  . HYPERTENSION, BENIGN ESSENTIAL 01/04/2007  . Allergic rhinitis 01/04/2007  . HEADACHE, TENSION 11/05/2006  . PROSTATE CANCER, HX OF 11/05/2006    Past Surgical History:  Procedure Laterality Date  .  APPENDECTOMY  1953  . CAROTID ENDARTERECTOMY Right 01/2000  . CATARACT EXTRACTION Bilateral   . CERVICAL DISC SURGERY  8/07  . HIP ARTHROPLASTY Left 10/24/2018   Procedure: ARTHROPLASTY BIPOLAR HIP (HEMIARTHROPLASTY);  Surgeon: Paralee Cancel, MD;  Location: [facility];  Service: Orthopedics;  Laterality: Left;  . INSERTION PROSTATE RADIATION SEED  2000  . [place] SURGERY  1990s  . NASAL SINUS SURGERY     x 3       Home Medications    Prior to Admission medications   Medication Sig Start Date End Date Taking? Authorizing Provider  acetaminophen (TYLENOL) 500 MG tablet Take 500 mg by mouth  every 6 (six) hours as needed for mild pain or headache.   Yes [provider]  albuterol (PROVENTIL HFA;VENTOLIN HFA) 108 (90 Base) MCG/ACT inhaler Inhale 1-2 puffs into the lungs every 6 (six) hours as needed for wheezing or shortness of breath. 03/02/15  Yes Melony Overly, MD  amLODipine (NORVASC) 5 MG tablet Take 2 tablets (10 mg total) by mouth daily. 06/22/19  Yes Fay Records, MD  apixaban (ELIQUIS) 5 MG TABS tablet Take 1 tablet (5 mg total) by mouth 2 (two) times daily. 07/05/19  Yes Fay Records, MD  atorvastatin (LIPITOR) 40 MG tablet TAKE 1 TABLET BY MOUTH DAILY. 05/12/19  Yes [name], MD  cloNIDine (CATAPRES) 0.1 MG tablet Take 1 tablet (0.1 mg total) by mouth 2 (two) times daily. 12/23/18  Yes Fay Records, MD  famotidine (PEPCID) 40 MG tablet Take 1 tablet (40 mg total) by mouth daily. 06/07/19  Yes [name], MD  fluticasone (FLOVENT HFA) 110 MCG/ACT inhaler Inhale 2 puffs into the lungs 2 (two) times daily. 06/03/18  Yes Fay Records, MD  gabapentin (NEURONTIN) 300 MG capsule TAKE 1 CAPSULE BY MOUTH 3 TIMES DAILY 05/05/19  Yes [name], MD  lisinopril (ZESTRIL) 2.5 MG tablet TAKE 1 TABLET BY MOUTH DAILY. 08/09/19  Yes [name], MD  Menthol-Methyl Salicylate (MUSCLE RUB) 10-15 % CREA Apply 1 application topically 3 (three) times daily with meals. To BLE 11/04/18  Yes Love, Ivan Anchors, PA-C  methylPREDNISolone (MEDROL DOSEPAK) 4 MG TBPK tablet Follow instructions on package. 07/10/19  Yes Wendling, Crosby Oyster, DO  metoCLOPramide (REGLAN) 5 MG tablet Take 1 tablet (5 mg total) by mouth at bedtime. 10/30/19  Yes Gatha Mayer, MD  metoprolol succinate (TOPROL-XL) 25 MG 24 hr tablet TAKE 1/2 TABLET BY MOUTH DAILY 03/17/19  Yes Fay Records, MD  minoxidil (LONITEN) 10 MG tablet TAKE 1 TABLET BY MOUTH TWICE DAILY 09/08/19  Yes Fay Records, MD  ondansetron (ZOFRAN ODT) 8 MG disintegrating tablet Take 1 tablet (8 mg total) by mouth every 8 (eight) hours as  needed for nausea or vomiting. 06/15/19  Yes Saguier, Percell Miller, PA-C  pantoprazole (PROTONIX) 40 MG tablet TAKE 1 TABLET BY MOUTH ONCE A DAY BEFORE BREAKFAST (NEEDS OFFICE VISIT) 10/16/19  Yes Gatha Mayer, MD  polyethylene glycol (MIRALAX / GLYCOLAX) 17 g packet Take 17 g by mouth 2 (two) times daily. 10/25/18  Yes Babish, Rodman Key, PA-C  polyvinyl alcohol (ARTIFICIAL TEARS) 1.4 % ophthalmic solution Place 1 drop into both eyes as needed for dry eyes.    Yes [provider]  traMADol (ULTRAM) 50 MG tablet TAKE 1 TABLET BY MOUTH 3 TIMES DAILY AS NEEDED FOR MODERATE PAIN 11/07/19  Yes [name], MD  triamcinolone cream (KENALOG) 0.5 % Apply 1 application  topically daily as needed (for skin irritation).  01/22/12  Yes Janith Lima, MD  doxycycline (VIBRA-TABS) 100 MG tablet Take 1 tablet (100 mg total) by mouth 2 (two) times daily. 11/13/19   Volney American, PA-C  ferrous sulfate (FERROUSUL) 325 (65 FE) MG tablet Take 1 tablet (325 mg total) by mouth 3 (three) times daily with meals for 14 days. 11/24/18 05/11/19  [name], MD    Family History Family History  Problem Relation Age of Onset  . Stroke Brother   . Stroke Sister   . Stroke Mother   . Hyperlipidemia Mother   . Hypertension Mother   . Lung disease Father   . Diabetes Son   . Gout Son   . Obesity Sister   . Alcohol abuse Brother   . Cancer Brother        lung, smoker    Social History Social History   Tobacco Use  . Smoking status: Former Smoker    Packs/day: 0.25    Years: 35.00    Pack years: 8.75    Types: Cigarettes    Quit date: 01/12/1978    Years since quitting: 41.8  . Smokeless tobacco: Current User    Types: Chew  Substance Use Topics  . Alcohol use: No    Comment: Prior alcoholic, sober since 5409  . Drug use: No     Allergies   Hctz [hydrochlorothiazide], Hydralazine, Diovan [valsartan], Metformin and related, Nsaids, Other, Prednisone, Spironolactone, Codeine, and  Oxycodone-acetaminophen   Review of Systems Review of Systems PER HPI   Physical Exam Triage Vital Signs ED Triage Vitals  Enc Vitals Group     BP 11/13/19 1731 (!) 141/57     Pulse Rate 11/13/19 1731 79     Resp 11/13/19 1731 18     Temp 11/13/19 1731 99.3 F (37.4 C)     Temp Source 11/13/19 1731 Oral     SpO2 11/13/19 1731 95 %     Weight --      Height --      Head Circumference --      Peak Flow --      Pain Score 11/13/19 1751 6     Pain Loc --      Pain Edu? --      Excl. in [place]? --    No data found.  Updated Vital Signs BP (!) 141/57 (BP Location: Left Arm)   Pulse 79   Temp 99.3 F (37.4 C) (Oral)   Resp 18   SpO2 95%   Visual Acuity Right Eye Distance:   Left Eye Distance:   Bilateral Distance:    Right Eye Near:   Left Eye Near:    Bilateral Near:     Physical Exam Vitals and nursing note reviewed.  Constitutional:      Appearance: Normal appearance.  HENT:     Head: Atraumatic.     Right Ear: Tympanic membrane normal.     Left Ear: Tympanic membrane normal.     Nose: Nose normal.  Eyes:     Extraocular Movements: Extraocular movements intact.     Conjunctiva/sclera: Conjunctivae normal.  Cardiovascular:     Rate and Rhythm: Normal rate.  Pulmonary:     Effort: Pulmonary effort is normal. No respiratory distress.     Breath sounds: Normal breath sounds. No wheezing or rales.  Musculoskeletal:        General: Normal range of motion.     Cervical back:  Normal range of motion and neck supple.  Skin:    General: Skin is warm.     Comments: Small ulceration left medial lower leg with surrounding erythema and edema and warmth extending from ankle to about 4 cm below knee  Neurological:     General: No focal deficit present.     Comments: Cognition at baseline per patient and wife  Psychiatric:        Mood and Affect: Mood normal.        Thought Content: Thought content normal.        Judgment: Judgment normal.    UC Treatments /  Results  Labs (all labs ordered are listed, but only abnormal results are displayed) Labs Reviewed - No data to display  EKG   Radiology No results found.  Procedures Procedures (including critical care time)  Medications Ordered in UC Medications - No data to display  Initial Impression / Assessment and Plan / UC Course  I have reviewed the triage vital signs and the nursing notes.  Pertinent labs & imaging results that were available during my care of the patient were reviewed by me and considered in my medical decision making (see chart for details).     Will start doxycycline and continue tylenol, leg elevation for suspected cellulitis of left leg. Discussed strict ED precautions for worsening sxs. Handout given on epley maneuvers for his suspected vertigo. F/u with PCP or ENT for this if not resolving with conservative measures.  Final Clinical Impressions(s) / UC Diagnoses   Final diagnoses:  Cellulitis of left lower extremity  Benign paroxysmal positional vertigo, unspecified laterality   Discharge Instructions   None    ED Prescriptions    Medication Sig Dispense Auth. Provider   doxycycline (VIBRA-TABS) 100 MG tablet Take 1 tablet (100 mg total) by mouth 2 (two) times daily. 14 tablet Volney American, [place]     PDMP not reviewed this encounter.   Volney American, [place] 11/13/19 [phone]

## 2019-11-13 NOTE — Telephone Encounter (Signed)
Thanks

## 2019-11-13 NOTE — ED Notes (Signed)
Wound covered with neosporin and dressed with coban. Patient tolerated well.

## 2019-11-13 NOTE — Telephone Encounter (Signed)
FYI Patient wife states patient has a fever of 102. She is taking [facility] to [facility]

## 2019-11-13 NOTE — ED Triage Notes (Signed)
Pts wife states that he had a fever this morning of 102.2 and was confused,she states he couldn't figure out how to turn off the alarm clock he uses everyday. she states she checked his left lower leg and noticed it was red and inflammed. He states it was red all the way up to knee. Pt states it was painful and hot to the touch.

## 2019-11-22 NOTE — Progress Notes (Signed)
Cardiology Office Note   Date:  11/23/2019   ID:  [name], DOB [birth year], MRN [id number]  PCP:  Scott Lukes, MD  Cardiologist:   Scott Carnes, MD   F/U of HTN   History of Present Illness: [name] is a 86 y.o. male with a history of HTN, CV dz (s/p L CEA 2002), HL, GERD, reactive airway dz, esophageal stricture, and PAF   Echo in 2020 LVEF and RVEF normal   I saw the pt in April 2021  Since seen his wife says he is sleeping a lot   BP has been OK   The pt says his breathing is OK   Does note some wheezing    He was seen in an Urgent Care.for cellulits  Had a fever  Rx ABX   Still with swelling and discomfort     The pt denies CP   No palpitations   SOme dizziness   Had vertigo in past   Doesn't remember maneuvers     Current Meds  Medication Sig  . acetaminophen (TYLENOL) 500 MG tablet Take 500 mg by mouth every 6 (six) hours as needed for mild pain or headache.  . albuterol (PROVENTIL HFA;VENTOLIN HFA) 108 (90 Base) MCG/ACT inhaler Inhale 1-2 puffs into the lungs every 6 (six) hours as needed for wheezing or shortness of breath.  [name] Kitchen amLODipine (NORVASC) 5 MG tablet Take 2 tablets (10 mg total) by mouth daily.  [name] Kitchen apixaban (ELIQUIS) 5 MG TABS tablet Take 1 tablet (5 mg total) by mouth 2 (two) times daily.  [name] Kitchen atorvastatin (LIPITOR) 40 MG tablet TAKE 1 TABLET BY MOUTH DAILY.  . cloNIDine (CATAPRES) 0.1 MG tablet Take 1 tablet (0.1 mg total) by mouth 2 (two) times daily.  . famotidine (PEPCID) 40 MG tablet Take 1 tablet (40 mg total) by mouth daily. (Patient taking differently: Take 40 mg by mouth as needed for heartburn or indigestion. )  . ferrous sulfate (FERROUSUL) 325 (65 FE) MG tablet Take 1 tablet (325 mg total) by mouth 3 (three) times daily with meals for 14 days.  . fluticasone (FLOVENT HFA) 110 MCG/ACT inhaler Inhale 2 puffs into the lungs 2 (two) times daily.  [name] Kitchen gabapentin (NEURONTIN) 300 MG capsule TAKE 1 CAPSULE BY MOUTH 3 TIMES DAILY  . lisinopril (ZESTRIL)  2.5 MG tablet TAKE 1 TABLET BY MOUTH DAILY.  [name] Kitchen Menthol-Methyl Salicylate (MUSCLE RUB) 10-15 % CREA Apply 1 application topically 3 (three) times daily with meals. To BLE  . metoCLOPramide (REGLAN) 5 MG tablet Take 1 tablet (5 mg total) by mouth at bedtime.  . metoprolol succinate (TOPROL-XL) 25 MG 24 hr tablet TAKE 1/2 TABLET BY MOUTH DAILY  . minoxidil (LONITEN) 10 MG tablet TAKE 1 TABLET BY MOUTH TWICE DAILY  . ondansetron (ZOFRAN ODT) 8 MG disintegrating tablet Take 1 tablet (8 mg total) by mouth every 8 (eight) hours as needed for nausea or vomiting.  . pantoprazole (PROTONIX) 40 MG tablet TAKE 1 TABLET BY MOUTH ONCE A DAY BEFORE BREAKFAST (NEEDS OFFICE VISIT)  . polyethylene glycol (MIRALAX / GLYCOLAX) 17 g packet Take 17 g by mouth 2 (two) times daily.  . polyvinyl alcohol (ARTIFICIAL TEARS) 1.4 % ophthalmic solution Place 1 drop into both eyes as needed for dry eyes.   . traMADol (ULTRAM) 50 MG tablet TAKE 1 TABLET BY MOUTH 3 TIMES DAILY AS NEEDED FOR MODERATE PAIN  . triamcinolone cream (KENALOG) 0.5 % Apply 1 application topically daily as needed (for  skin irritation).      Allergies:   Hctz [hydrochlorothiazide], Hydralazine, Diovan [valsartan], Metformin and related, Nsaids, Other, Prednisone, Spironolactone, Codeine, and Oxycodone-acetaminophen   Past Medical History:  Diagnosis Date  . Acute bronchitis 06/10/2015  . Allergic rhinitis   . Anemia   . Asthma   . Carotid stenosis    a. s/p Left CEA 2002;  b. Carotid US 3/16:  patent R CEA, L < 40%  . Chronic pain syndrome    Left shoulder, back  . Diverticulosis   . Dyslipidemia   . Epigastric pain 04/05/2016  . Esophageal stricture    Distal, benign  . GERD (gastroesophageal reflux disease)   . Hemorrhoids   . HTN (hypertension)    Negative renal duplex 07-22-11  . Hx of echocardiogram    a. Echo 10/11: mod LVH, EF 55-60%  . Prostate cancer ([place]) 2000   Seed XRT  . Spondylosis, cervical, with myelopathy 11/26/2015  .  Stroke [facility]) 8/01   right thalamic - on chronic Plavix/ASA    Past Surgical History:  Procedure Laterality Date  . APPENDECTOMY  1953  . CAROTID ENDARTERECTOMY Right 01/2000  . CATARACT EXTRACTION Bilateral   . CERVICAL DISC SURGERY  8/07  . HIP ARTHROPLASTY Left 10/24/2018   Procedure: ARTHROPLASTY BIPOLAR HIP (HEMIARTHROPLASTY);  Surgeon: Paralee Cancel, MD;  Location: [place];  Service: Orthopedics;  Laterality: Left;  . INSERTION PROSTATE RADIATION SEED  2000  . [place] SURGERY  1990s  . NASAL SINUS SURGERY     x 3     Social History:  The patient  reports that he quit smoking about 41 years ago. His smoking use included cigarettes. He has a 8.75 pack-year smoking history. His smokeless tobacco use includes chew. He reports that he does not drink alcohol and does not use drugs.   Family History:  The patient's family history includes Alcohol abuse in his brother; Cancer in his brother; Diabetes in his son; Gout in his son; Hyperlipidemia in his mother; Hypertension in his mother; Lung disease in his father; Obesity in his sister; Stroke in his brother, mother, and sister.    ROS:  Please see the history of present illness. All other systems are reviewed and  Negative to the above problem except as noted.    PHYSICAL EXAM: VS:  BP (!) 130/50   Pulse 61   Ht 5\' 11"  (1.803 m) Comment: Wife stated height for husband  Wt 150 lb (68 kg)   SpO2 97%   BMI 20.92 kg/m   GEN: Thin 86 yo in no acute distress  HEENT: normal  Neck: no JVD Cardiac: RRR; no murmur; no LE edema  Respiratory:  Wheezing bilaterally   No rales GI: soft, nontender, nondistended, + BS   MS: no deformity Moving all extremities   Skin: warm and dry, no rash Neuro:  Strength and sensation are intact Psych: euthymic mood, full affect   EKG:  EKG is prdered today.  SR 61     Lipid Panel    Component Value Date/Time   CHOL 107 05/29/2019 1508   TRIG 86.0 05/29/2019 1508   HDL 40.50 05/29/2019 1508    CHOLHDL 3 05/29/2019 1508   VLDL 17.2 05/29/2019 1508   LDLCALC 50 05/29/2019 1508      Wt Readings from Last 3 Encounters:  11/23/19 150 lb (68 kg)  07/10/19 150 lb 8 oz (68.3 kg)  06/15/19 149 lb 9.6 oz (67.9 kg)      ASSESSMENT  AND PLAN: 1  HTN   BP is OK   I would keep on same regimen     2  PAF  Continue Eliuqis  Denies palpitations   WIll look at coverage  3  Reactive airway dz.  He is wheezing today   Has appt in IM  He could also be seen in allergery (Dr Donneta Romberg)  Start inhalers   His wife says he is using     4  Cellulitis   Still with erythema   I have made an appt with IM tomorrow   I am worried that if he waits until next weeks appt with Dr Charlett Blake it may be back.    5 CV dz   Keep on statin  6  Dizziness  Has had vertigo in past   He is having more dizziness   Will refer back to Vestibular rehab    6 HL  Good control on Lipitor   Continue       Current medicines are reviewed at length with the patient today.  The patient does not have concerns regarding medicines.  Signed, Scott Carnes, MD  11/23/2019 11:38 AM    [address], [place], [place]  [address] Phone: [phone]; Fax: [phone]

## 2019-11-23 ENCOUNTER — Encounter: Payer: Self-pay | Admitting: Internal Medicine

## 2019-11-23 ENCOUNTER — Ambulatory Visit (INDEPENDENT_AMBULATORY_CARE_PROVIDER_SITE_OTHER): Payer: PPO | Admitting: Internal Medicine

## 2019-11-23 ENCOUNTER — Other Ambulatory Visit: Payer: Self-pay

## 2019-11-23 VITALS — BP 130/50 | HR 61 | Ht 71.0 in | Wt 150.0 lb

## 2019-11-23 DIAGNOSIS — I10 Essential (primary) hypertension: Secondary | ICD-10-CM | POA: Diagnosis not present

## 2019-11-23 DIAGNOSIS — R42 Dizziness and giddiness: Secondary | ICD-10-CM

## 2019-11-23 NOTE — Patient Instructions (Addendum)
Medication Instructions:  No changes *If you need a refill on your cardiac medications before your next appointment, please call your pharmacy*   Lab Work: Today: CBC, BMET  If you have labs (blood work) drawn today and your tests are completely normal, you will receive your results only by: [name] Kitchen MyChart Message (if you have MyChart) OR . A paper copy in the mail If you have any lab test that is abnormal or we need to change your treatment, we will call you to review the results.   Testing/Procedures: No changes   Follow-Up: At [facility], you and your health needs are our priority.  As part of our continuing mission to provide you with exceptional heart care, we have created designated Provider Care Teams.  These Care Teams include your primary Cardiologist (physician) and Advanced Practice Providers (APPs -  Physician Assistants and Nurse Practitioners) who all work together to provide you with the care you need, when you need it.  Your next appointment:   7 month(s)  (June 2022)  The format for your next appointment:   In Person  Provider:   You may see [name], MD or one of the following Advanced Practice Providers on your designated Care Team:    Richardson Dopp, PA-C  Robbie Lis, [place]    Other Instructions You have been referred to [facility] ([facility])

## 2019-11-24 ENCOUNTER — Ambulatory Visit (INDEPENDENT_AMBULATORY_CARE_PROVIDER_SITE_OTHER): Payer: PPO | Admitting: Medical

## 2019-11-24 ENCOUNTER — Encounter: Payer: Self-pay | Admitting: Medical

## 2019-11-24 ENCOUNTER — Other Ambulatory Visit: Payer: Self-pay | Admitting: Medical

## 2019-11-24 VITALS — BP 122/55 | HR 60 | Resp 18 | Ht 71.0 in | Wt 150.6 lb

## 2019-11-24 DIAGNOSIS — L03116 Cellulitis of left lower limb: Secondary | ICD-10-CM | POA: Diagnosis not present

## 2019-11-24 DIAGNOSIS — R062 Wheezing: Secondary | ICD-10-CM

## 2019-11-24 LAB — CBC
Hematocrit: 34.7 % — ABNORMAL LOW (ref 37.5–51.0)
Hemoglobin: 11.3 g/dL — ABNORMAL LOW (ref 13.0–17.7)
MCH: 30.5 pg (ref 26.6–33.0)
MCHC: 32.6 g/dL (ref 31.5–35.7)
MCV: 94 fL (ref 79–97)
Platelets: 326 10*3/uL (ref 150–450)
RBC: 3.71 x10E6/uL — ABNORMAL LOW (ref 4.14–5.80)
RDW: 13.6 % (ref 11.6–15.4)
WBC: 6.9 10*3/uL (ref 3.4–10.8)

## 2019-11-24 LAB — BASIC METABOLIC PANEL
BUN/Creatinine Ratio: 16 (ref 10–24)
BUN: 18 mg/dL (ref 8–27)
CO2: 27 mmol/L (ref 20–29)
Calcium: 8.7 mg/dL (ref 8.6–10.2)
Chloride: 104 mmol/L (ref 96–106)
Creatinine, Ser: 1.12 mg/dL (ref 0.76–1.27)
GFR calc Af Amer: 67 mL/min/{1.73_m2} (ref >59)
GFR calc non Af Amer: 58 mL/min/{1.73_m2} — ABNORMAL LOW (ref >59)
Glucose: 106 mg/dL — ABNORMAL HIGH (ref 65–99)
Potassium: 5 mmol/L (ref 3.5–5.2)
Sodium: 141 mmol/L (ref 134–144)

## 2019-11-24 MED ORDER — CEFTRIAXONE SODIUM 1 G IJ SOLR
1.0000 g | Freq: Once | INTRAMUSCULAR | Status: AC
  Administered 2019-11-24: 1 g via INTRAMUSCULAR

## 2019-11-24 MED ORDER — DOXYCYCLINE HYCLATE 100 MG PO TABS: 100.0000 mg | ORAL_TABLET | Freq: Two times a day (BID) | ORAL | 0 refills | Status: DC

## 2019-11-24 MED FILL — DOXYCYCLINE HYCLATE 100 MG: 100 | 5 days supply | Qty: 10 | Fill #0

## 2019-11-24 NOTE — Progress Notes (Signed)
Subjective:    Patient ID: [name], male    DOB: 1930/01/09, 86 y.o.   MRN: [id number]  HPI  Pt in for evaluation.  Pt seen in ED on 11-13-2019.   Hpi in ED Patient presenting today with wife for evaluation of several days of a sore on lower left leg and now woke up this morning with 102 degree fever, disorientation and significant leg pain. Wife notes the leg was swollen red nearly up to knee.   Plan from ED. Will start doxycycline and continue tylenol, leg elevation for suspected cellulitis of left leg. Discussed strict ED precautions for worsening sxs. Handout given on epley maneuvers for his suspected vertigo. F/u with PCP or ENT for this if not resolving with conservative measures.  Pt only gave doxy for 7 days.  Within one day he states his fever went away and leg felt better. He states better but still has some tenderness. Cardiologist thought he need more antibiotics.  Pt has no wheezing today. Pt has symbicort but does not use symbicort daily.Has albuterol available.  Hx of wheezing. No formal dx of copd but is former smoker. No chest congestion per pt. No productive cough. Smoked between 86 yo to 31 yo.       Review of Systems  Constitutional: Negative for chills, fatigue and fever.  Respiratory: Negative for cough, chest tightness, shortness of breath and wheezing.   Cardiovascular: Negative for chest pain and palpitations.  Gastrointestinal: Negative for abdominal pain.  Musculoskeletal: Negative for back pain and joint swelling.  Neurological: Negative for dizziness, syncope, weakness and numbness.       Intermittent rare brief vertigo. Request info on epley maneuvers  Hematological: Negative for adenopathy. Does not bruise/bleed easily.  Psychiatric/Behavioral: Negative for behavioral problems and confusion.     Past Medical History:  Diagnosis Date  . Acute bronchitis 06/10/2015  . Allergic rhinitis   . Anemia   . Asthma   . Carotid stenosis    a. s/p  Left CEA 2002;  b. Carotid US 3/16:  patent R CEA, L < 40%  . Chronic pain syndrome    Left shoulder, back  . Diverticulosis   . Dyslipidemia   . Epigastric pain 04/05/2016  . Esophageal stricture    Distal, benign  . GERD (gastroesophageal reflux disease)   . Hemorrhoids   . HTN (hypertension)    Negative renal duplex 07-22-11  . Hx of echocardiogram    a. Echo 10/11: mod LVH, EF 55-60%  . Prostate cancer ([place]) 2000   Seed XRT  . Spondylosis, cervical, with myelopathy 11/26/2015  . Stroke [facility]) 8/01   right thalamic - on chronic Plavix/ASA     Social History   Socioeconomic History  . Marital status: Married    Spouse name: Not on file  . Number of children: 1  . Years of education: 99  . Highest education level: Not on file  Occupational History  . Occupation: Retired  Tobacco Use  . Smoking status: Former Smoker    Packs/day: 0.25    Years: 35.00    Pack years: 8.75    Types: Cigarettes    Quit date: 01/12/1978    Years since quitting: 41.8  . Smokeless tobacco: Current User    Types: Chew  Substance and Sexual Activity  . Alcohol use: No    Comment: Prior alcoholic, sober since 8182  . Drug use: No  . Sexual activity: Not Currently  Other Topics Concern  .  Not on file  Social History Narrative   Lives at home w/ his wife   Right-handed    Caffeine: drinks a lot of coffee per report   Social Determinants of Health   Financial Resource Strain:   . Difficulty of Paying Living Expenses: Not on file  Food Insecurity:   . Worried About Charity fundraiser in the Last Year: Not on file  . Ran Out of Food in the Last Year: Not on file  Transportation Needs:   . Lack of Transportation (Medical): Not on file  . Lack of Transportation (Non-Medical): Not on file  Physical Activity:   . Days of Exercise per Week: Not on file  . Minutes of Exercise per Session: Not on file  Stress:   . Feeling of Stress : Not on file  Social Connections:   . Frequency of  Communication with Friends and Family: Not on file  . Frequency of Social Gatherings with Friends and Family: Not on file  . Attends Religious Services: Not on file  . Active Member of Clubs or Organizations: Not on file  . Attends Archivist Meetings: Not on file  . Marital Status: Not on file  Intimate Partner Violence:   . Fear of Current or Ex-Partner: Not on file  . Emotionally Abused: Not on file  . Physically Abused: Not on file  . Sexually Abused: Not on file    Past Surgical History:  Procedure Laterality Date  . APPENDECTOMY  1953  . CAROTID ENDARTERECTOMY Right 01/2000  . CATARACT EXTRACTION Bilateral   . CERVICAL DISC SURGERY  8/07  . HIP ARTHROPLASTY Left 10/24/2018   Procedure: ARTHROPLASTY BIPOLAR HIP (HEMIARTHROPLASTY);  Surgeon: Paralee Cancel, MD;  Location: [facility];  Service: Orthopedics;  Laterality: Left;  . INSERTION PROSTATE RADIATION SEED  2000  . [place] SURGERY  1990s  . NASAL SINUS SURGERY     x 3    Family History  Problem Relation Age of Onset  . Stroke Brother   . Stroke Sister   . Stroke Mother   . Hyperlipidemia Mother   . Hypertension Mother   . Lung disease Father   . Diabetes Son   . Gout Son   . Obesity Sister   . Alcohol abuse Brother   . Cancer Brother        lung, smoker    Allergies  Allergen Reactions  . Hctz [Hydrochlorothiazide] Shortness Of Breath, Swelling and Other (See Comments)    "Swelling and dyspnea"   . Hydralazine Shortness Of Breath and Other (See Comments)    Chest pain and GI issues also  . Diovan [Valsartan] Other (See Comments)    Elevated potassium- Hyperkalemia  . Metformin And Related Other (See Comments)    Reaction not recalled  . Nsaids Other (See Comments)    Other than Tylenol, he isn't to have these because he's on Eliquis  . Other Other (See Comments)    Unnamed topically-applied B/P patch = Caused redness  . Prednisone Other (See Comments)    Suicidal thoughts  . Spironolactone  Swelling    Site of swelling not recalled  . Codeine Itching and Nausea Only  . Oxycodone-Acetaminophen Itching and Nausea Only    Current Outpatient Medications on File Prior to Visit  Medication Sig Dispense Refill  . acetaminophen (TYLENOL) 500 MG tablet Take 500 mg by mouth every 6 (six) hours as needed for mild pain or headache.    . albuterol (PROVENTIL HFA;VENTOLIN  HFA) 108 (90 Base) MCG/ACT inhaler Inhale 1-2 puffs into the lungs every 6 (six) hours as needed for wheezing or shortness of breath. 1 Inhaler 6  . amLODipine (NORVASC) 5 MG tablet Take 2 tablets (10 mg total) by mouth daily. 180 tablet 2  . apixaban (ELIQUIS) 5 MG TABS tablet Take 1 tablet (5 mg total) by mouth 2 (two) times daily. 180 tablet 2  . atorvastatin (LIPITOR) 40 MG tablet TAKE 1 TABLET BY MOUTH DAILY. 90 tablet 1  . cloNIDine (CATAPRES) 0.1 MG tablet Take 1 tablet (0.1 mg total) by mouth 2 (two) times daily. 90 tablet 3  . famotidine (PEPCID) 40 MG tablet Take 1 tablet (40 mg total) by mouth daily. (Patient taking differently: Take 40 mg by mouth as needed for heartburn or indigestion. ) 30 tablet 2  . fluticasone (FLOVENT HFA) 110 MCG/ACT inhaler Inhale 2 puffs into the lungs 2 (two) times daily. 1 Inhaler 0  . gabapentin (NEURONTIN) 300 MG capsule TAKE 1 CAPSULE BY MOUTH 3 TIMES DAILY 270 capsule 1  . lisinopril (ZESTRIL) 2.5 MG tablet TAKE 1 TABLET BY MOUTH DAILY. 90 tablet 1  . Menthol-Methyl Salicylate (MUSCLE RUB) 10-15 % CREA Apply 1 application topically 3 (three) times daily with meals. To BLE 85 g 0  . metoCLOPramide (REGLAN) 5 MG tablet Take 1 tablet (5 mg total) by mouth at bedtime. 30 tablet 0  . metoprolol succinate (TOPROL-XL) 25 MG 24 hr tablet TAKE 1/2 TABLET BY MOUTH DAILY 45 tablet 2  . minoxidil (LONITEN) 10 MG tablet TAKE 1 TABLET BY MOUTH TWICE DAILY 180 tablet 3  . ondansetron (ZOFRAN ODT) 8 MG disintegrating tablet Take 1 tablet (8 mg total) by mouth every 8 (eight) hours as needed for  nausea or vomiting. 20 tablet 0  . pantoprazole (PROTONIX) 40 MG tablet TAKE 1 TABLET BY MOUTH ONCE A DAY BEFORE BREAKFAST (NEEDS OFFICE VISIT) 90 tablet 0  . polyethylene glycol (MIRALAX / GLYCOLAX) 17 g packet Take 17 g by mouth 2 (two) times daily. 28 packet 0  . polyvinyl alcohol (ARTIFICIAL TEARS) 1.4 % ophthalmic solution Place 1 drop into both eyes as needed for dry eyes.     . traMADol (ULTRAM) 50 MG tablet TAKE 1 TABLET BY MOUTH 3 TIMES DAILY AS NEEDED FOR MODERATE PAIN 90 tablet 0  . triamcinolone cream (KENALOG) 0.5 % Apply 1 application topically daily as needed (for skin irritation).     . ferrous sulfate (FERROUSUL) 325 (65 FE) MG tablet Take 1 tablet (325 mg total) by mouth 3 (three) times daily with meals for 14 days. 90 tablet 2   No current facility-administered medications on file prior to visit.    BP (!) 147/46   Pulse 60   Resp 18   Ht 5\' 11"  (1.803 m)   Wt 150 lb 9.6 oz (68.3 kg)   SpO2 96%   BMI 21.00 kg/m       Objective:   Physical Exam  General Mental Status- Alert. General Appearance- Not in acute distress.   Skin General: Color- Normal Color. Moisture- Normal Moisture.  Neck Carotid Arteries- Normal color. Moisture- Normal Moisture. No carotid bruits. No JVD.  Chest and Lung Exam Auscultation: Breath Sounds:-Normal.  Cardiovascular Auscultation:Rythm- Regular. Murmurs & Other Heart Sounds:Auscultation of the heart reveals- No Murmurs.  Abdomen Inspection:-Inspeection Normal. Palpation/Percussion:Note:No mass. Palpation and Percussion of the abdomen reveal- Non Tender, Non Distended + BS, no rebound or guarding.   Neurologic Cranial Nerve exam:- CN III-XII intact(No  nystagmus), symmetric smile. Drift Test:- No drift. Strength:- 5/5 equal and symmetric strength both upper and lower extremities.  Left lower ext- medial distal calf is mild pinkish red and tender. Mild indurated and warm. No calf swelling. No popliteal pain.       Assessment & Plan:  For persisting but improved cellulitis gave rocephin 1 gram im and 5 additional days of doxy. Rx advisment. Recheck on Tuesday.  For hx of wheezing in termittently recommend symbicort daily and albuterol if needed.  For hx of intermittent rare vertigo. Can try Epley maneuvers as discussed. Also could review on line/video demonstrations.  Follow up on Tuesday with pcp or as needed.  Mackie Pai, PA-C   Time spent with patient today was 30  minutes which consisted of chart review, discussing diagnoses, work up treatment and documentation.

## 2019-11-24 NOTE — Patient Instructions (Addendum)
For persisting but improved cellulitis gave rocephin 1 gram im and 5 additional days of doxy. Rx advisment. Recheck on Tuesday.  For hx of wheezing in termittently recommend symbicort daily and albuterol if needed.  For hx of intermittent rare vertigo. Can try Epley maneuvers as discussed. Also could review on line/video demonstrations.  Follow up on Tuesday with pcp or as needed.     How to Perform the Epley Maneuver The Epley maneuver is an exercise that relieves symptoms of vertigo. Vertigo is the feeling that you or your surroundings are moving when they are not. When you feel vertigo, you may feel like the room is spinning and have trouble walking. Dizziness is a little different than vertigo. When you are dizzy, you may feel unsteady or light-headed. You can do this maneuver at home whenever you have symptoms of vertigo. You can do it up to 3 times a day until your symptoms go away. Even though the Epley maneuver may relieve your vertigo for a few weeks, it is possible that your symptoms will return. This maneuver relieves vertigo, but it does not relieve dizziness. What are the risks? If it is done correctly, the Epley maneuver is considered safe. Sometimes it can lead to dizziness or nausea that goes away after a short time. If you develop other symptoms, such as changes in vision, weakness, or numbness, stop doing the maneuver and call your health care provider. How to perform the Epley maneuver 1. Sit on the edge of a bed or table with your back straight and your legs extended or hanging over the edge of the bed or table. 2. Turn your head halfway toward the affected ear or side. 3. Lie backward quickly with your head turned until you are lying flat on your back. You may want to position a pillow under your shoulders. 4. Hold this position for 30 seconds. You may experience an attack of vertigo. This is normal. 5. Turn your head to the opposite direction until your unaffected ear is  facing the floor. 6. Hold this position for 30 seconds. You may experience an attack of vertigo. This is normal. Hold this position until the vertigo stops. 7. Turn your whole body to the same side as your head. Hold for another 30 seconds. 8. Sit back up. You can repeat this exercise up to 3 times a day. Follow these instructions at home:  After doing the Epley maneuver, you can return to your normal activities.  Ask your health care provider if there is anything you should do at home to prevent vertigo. He or she may recommend that you: ? Keep your head raised (elevated) with two or more pillows while you sleep. ? Do not sleep on the side of your affected ear. ? Get up slowly from bed. ? Avoid sudden movements during the day. ? Avoid extreme head movement, like looking up or bending over. Contact a health care provider if:  Your vertigo gets worse.  You have other symptoms, including: ? Nausea. ? Vomiting. ? Headache. Get help right away if:  You have vision changes.  You have a severe or worsening headache or neck pain.  You cannot stop vomiting.  You have new numbness or weakness in any part of your body. Summary  Vertigo is the feeling that you or your surroundings are moving when they are not.  The Epley maneuver is an exercise that relieves symptoms of vertigo.  If the Epley maneuver is done correctly, it is considered  safe. You can do it up to 3 times a day. This information is not intended to replace advice given to you by your health care provider. Make sure you discuss any questions you have with your health care provider. Document Revised: 12/11/2016 Document Reviewed: 11/19/2015 Elsevier Patient Education  2020 [employer].

## 2019-11-28 ENCOUNTER — Ambulatory Visit (INDEPENDENT_AMBULATORY_CARE_PROVIDER_SITE_OTHER): Payer: PPO | Admitting: Family Medicine

## 2019-11-28 ENCOUNTER — Other Ambulatory Visit: Payer: Self-pay

## 2019-11-28 ENCOUNTER — Other Ambulatory Visit: Payer: Self-pay | Admitting: *Deleted

## 2019-11-28 ENCOUNTER — Encounter: Payer: Self-pay | Admitting: Family Medicine

## 2019-11-28 VITALS — BP 116/60 | HR 78 | Temp 97.9°F | Resp 16 | Wt 147.8 lb

## 2019-11-28 DIAGNOSIS — E782 Mixed hyperlipidemia: Secondary | ICD-10-CM

## 2019-11-28 DIAGNOSIS — E559 Vitamin D deficiency, unspecified: Secondary | ICD-10-CM

## 2019-11-28 DIAGNOSIS — I779 Disorder of arteries and arterioles, unspecified: Secondary | ICD-10-CM | POA: Diagnosis not present

## 2019-11-28 DIAGNOSIS — I10 Essential (primary) hypertension: Secondary | ICD-10-CM | POA: Diagnosis not present

## 2019-11-28 DIAGNOSIS — E8809 Other disorders of plasma-protein metabolism, not elsewhere classified: Secondary | ICD-10-CM

## 2019-11-28 DIAGNOSIS — Z0000 Encounter for general adult medical examination without abnormal findings: Secondary | ICD-10-CM | POA: Diagnosis not present

## 2019-11-28 DIAGNOSIS — L0390 Cellulitis, unspecified: Secondary | ICD-10-CM | POA: Diagnosis not present

## 2019-11-28 DIAGNOSIS — E1165 Type 2 diabetes mellitus with hyperglycemia: Secondary | ICD-10-CM

## 2019-11-28 DIAGNOSIS — K5903 Drug induced constipation: Secondary | ICD-10-CM

## 2019-11-28 DIAGNOSIS — D582 Other hemoglobinopathies: Secondary | ICD-10-CM

## 2019-11-28 MED ORDER — CEFDINIR 300 MG PO CAPS: 300.0000 mg | ORAL_CAPSULE | Freq: Two times a day (BID) | ORAL | 0 refills | Status: DC

## 2019-11-28 MED FILL — CEFDINIR 300 MG CAPSULE: 300 | 10 days supply | Qty: 20 | Fill #0

## 2019-11-28 NOTE — Progress Notes (Signed)
Subjective:    Patient ID: [name], male    DOB: 05-19-1929, 86 y.o.   MRN: [id number]  Chief Complaint  Patient presents with  . Annual Exam    HPI Patient is in today for annual preventative exam and f/u on chronic medical concerns. No recent hospitalizations but he has had a bad infection on his right leg and required several courses of antibiotics. He notes a shot of Rocephin seemed to help. He also notes needing more medicines to help with his constipation he has added 2 stool softners daily and he often needs to use MOM and prune juice as well. Denies CP/palp/SOB/HA/congestion/fevers/GI or GU c/o. Taking meds as prescribed. He is noting fatigue and is sleeping more. Is also eating less and does not get hungry often. Denies CP/palp/SOB/HA/congestion/fevers or GU c/o. Taking meds as prescribed  Past Medical History:  Diagnosis Date  . Acute bronchitis 06/10/2015  . Allergic rhinitis   . Anemia   . Asthma   . Carotid stenosis    a. s/p Left CEA 2002;  b. Carotid US 3/16:  patent R CEA, L < 40%  . Chronic pain syndrome    Left shoulder, back  . Diverticulosis   . Dyslipidemia   . Epigastric pain 04/05/2016  . Esophageal stricture    Distal, benign  . GERD (gastroesophageal reflux disease)   . Hemorrhoids   . HTN (hypertension)    Negative renal duplex 07-22-11  . Hx of echocardiogram    a. Echo 10/11: mod LVH, EF 55-60%  . Prostate cancer ([place]) 2000   Seed XRT  . Spondylosis, cervical, with myelopathy 11/26/2015  . Stroke [facility]) 8/01   right thalamic - on chronic Plavix/ASA    Past Surgical History:  Procedure Laterality Date  . APPENDECTOMY  1953  . CAROTID ENDARTERECTOMY Right 01/2000  . CATARACT EXTRACTION Bilateral   . CERVICAL DISC SURGERY  8/07  . HIP ARTHROPLASTY Left 10/24/2018   Procedure: ARTHROPLASTY BIPOLAR HIP (HEMIARTHROPLASTY);  Surgeon: Paralee Cancel, MD;  Location: [place];  Service: Orthopedics;  Laterality: Left;  . INSERTION PROSTATE RADIATION  SEED  2000  . [place] SURGERY  1990s  . NASAL SINUS SURGERY     x 3    Family History  Problem Relation Age of Onset  . Stroke Brother   . Stroke Sister   . Stroke Mother   . Hyperlipidemia Mother   . Hypertension Mother   . Lung disease Father   . Diabetes Son   . Gout Son   . Obesity Sister   . Alcohol abuse Brother   . Cancer Brother        lung, smoker    Social History   Socioeconomic History  . Marital status: Married    Spouse name: Not on file  . Number of children: 1  . Years of education: 37  . Highest education level: Not on file  Occupational History  . Occupation: Retired  Tobacco Use  . Smoking status: Former Smoker    Packs/day: 0.25    Years: 35.00    Pack years: 8.75    Types: Cigarettes    Quit date: 01/12/1978    Years since quitting: 41.9  . Smokeless tobacco: Current User    Types: Chew  Substance and Sexual Activity  . Alcohol use: No    Comment: Prior alcoholic, sober since 3646  . Drug use: No  . Sexual activity: Not Currently  Other Topics Concern  . Not  on file  Social History Narrative   Lives at home w/ his wife   Right-handed    Caffeine: drinks a lot of coffee per report   Social Determinants of Health   Financial Resource Strain:   . Difficulty of Paying Living Expenses: Not on file  Food Insecurity:   . Worried About Charity fundraiser in the Last Year: Not on file  . Ran Out of Food in the Last Year: Not on file  Transportation Needs:   . Lack of Transportation (Medical): Not on file  . Lack of Transportation (Non-Medical): Not on file  Physical Activity:   . Days of Exercise per Week: Not on file  . Minutes of Exercise per Session: Not on file  Stress:   . Feeling of Stress : Not on file  Social Connections:   . Frequency of Communication with Friends and Family: Not on file  . Frequency of Social Gatherings with Friends and Family: Not on file  . Attends Religious Services: Not on file  . Active Member of  Clubs or Organizations: Not on file  . Attends Archivist Meetings: Not on file  . Marital Status: Not on file  Intimate Partner Violence:   . Fear of Current or Ex-Partner: Not on file  . Emotionally Abused: Not on file  . Physically Abused: Not on file  . Sexually Abused: Not on file    Outpatient Medications Prior to Visit  Medication Sig Dispense Refill  . acetaminophen (TYLENOL) 500 MG tablet Take 500 mg by mouth every 6 (six) hours as needed for mild pain or headache.    . albuterol (PROVENTIL HFA;VENTOLIN HFA) 108 (90 Base) MCG/ACT inhaler Inhale 1-2 puffs into the lungs every 6 (six) hours as needed for wheezing or shortness of breath. 1 Inhaler 6  . amLODipine (NORVASC) 5 MG tablet Take 2 tablets (10 mg total) by mouth daily. 180 tablet 2  . apixaban (ELIQUIS) 5 MG TABS tablet Take 1 tablet (5 mg total) by mouth 2 (two) times daily. 180 tablet 2  . atorvastatin (LIPITOR) 40 MG tablet TAKE 1 TABLET BY MOUTH DAILY. 90 tablet 1  . cloNIDine (CATAPRES) 0.1 MG tablet Take 1 tablet (0.1 mg total) by mouth 2 (two) times daily. 90 tablet 3  . doxycycline (VIBRA-TABS) 100 MG tablet Take 1 tablet (100 mg total) by mouth 2 (two) times daily. Can give caps or generic. 10 tablet 0  . famotidine (PEPCID) 40 MG tablet Take 1 tablet (40 mg total) by mouth daily. (Patient taking differently: Take 40 mg by mouth as needed for heartburn or indigestion. ) 30 tablet 2  . fluticasone (FLOVENT HFA) 110 MCG/ACT inhaler Inhale 2 puffs into the lungs 2 (two) times daily. 1 Inhaler 0  . gabapentin (NEURONTIN) 300 MG capsule TAKE 1 CAPSULE BY MOUTH 3 TIMES DAILY 270 capsule 1  . lisinopril (ZESTRIL) 2.5 MG tablet TAKE 1 TABLET BY MOUTH DAILY. 90 tablet 1  . Menthol-Methyl Salicylate (MUSCLE RUB) 10-15 % CREA Apply 1 application topically 3 (three) times daily with meals. To BLE 85 g 0  . metoCLOPramide (REGLAN) 5 MG tablet Take 1 tablet (5 mg total) by mouth at bedtime. 30 tablet 0  . metoprolol  succinate (TOPROL-XL) 25 MG 24 hr tablet TAKE 1/2 TABLET BY MOUTH DAILY 45 tablet 2  . minoxidil (LONITEN) 10 MG tablet TAKE 1 TABLET BY MOUTH TWICE DAILY 180 tablet 3  . ondansetron (ZOFRAN ODT) 8 MG disintegrating tablet Take 1  tablet (8 mg total) by mouth every 8 (eight) hours as needed for nausea or vomiting. 20 tablet 0  . pantoprazole (PROTONIX) 40 MG tablet TAKE 1 TABLET BY MOUTH ONCE A DAY BEFORE BREAKFAST (NEEDS OFFICE VISIT) 90 tablet 0  . polyethylene glycol (MIRALAX / GLYCOLAX) 17 g packet Take 17 g by mouth 2 (two) times daily. 28 packet 0  . polyvinyl alcohol (ARTIFICIAL TEARS) 1.4 % ophthalmic solution Place 1 drop into both eyes as needed for dry eyes.     . traMADol (ULTRAM) 50 MG tablet TAKE 1 TABLET BY MOUTH 3 TIMES DAILY AS NEEDED FOR MODERATE PAIN 90 tablet 0  . triamcinolone cream (KENALOG) 0.5 % Apply 1 application topically daily as needed (for skin irritation).     . ferrous sulfate (FERROUSUL) 325 (65 FE) MG tablet Take 1 tablet (325 mg total) by mouth 3 (three) times daily with meals for 14 days. 90 tablet 2   No facility-administered medications prior to visit.    Allergies  Allergen Reactions  . Hctz [Hydrochlorothiazide] Shortness Of Breath, Swelling and Other (See Comments)    "Swelling and dyspnea"   . Hydralazine Shortness Of Breath and Other (See Comments)    Chest pain and GI issues also  . Diovan [Valsartan] Other (See Comments)    Elevated potassium- Hyperkalemia  . Metformin And Related Other (See Comments)    Reaction not recalled  . Nsaids Other (See Comments)    Other than Tylenol, he isn't to have these because he's on Eliquis  . Other Other (See Comments)    Unnamed topically-applied B/P patch = Caused redness  . Prednisone Other (See Comments)    Suicidal thoughts  . Spironolactone Swelling    Site of swelling not recalled  . Codeine Itching and Nausea Only  . Oxycodone-Acetaminophen Itching and Nausea Only    Review of Systems   Constitutional: Positive for malaise/fatigue. Negative for chills and fever.  HENT: Negative for congestion and hearing loss.   Eyes: Negative for discharge.  Respiratory: Negative for cough, sputum production and shortness of breath.   Cardiovascular: Negative for chest pain, palpitations and leg swelling.  Gastrointestinal: Positive for constipation. Negative for abdominal pain, blood in stool, diarrhea, heartburn, nausea and vomiting.  Genitourinary: Negative for dysuria, frequency, hematuria and urgency.  Musculoskeletal: Positive for joint pain and myalgias. Negative for back pain and falls.  Skin: Positive for rash.  Neurological: Negative for dizziness, sensory change, loss of consciousness, weakness and headaches.  Endo/Heme/Allergies: Negative for environmental allergies. Does not bruise/bleed easily.  Psychiatric/Behavioral: Negative for depression and suicidal ideas. The patient is not nervous/anxious and does not have insomnia.        Objective:    Physical Exam Vitals and nursing note reviewed.  Constitutional:      General: He is not in acute distress.    Appearance: Normal appearance. He is well-developed. He is not ill-appearing.  HENT:     Head: Normocephalic and atraumatic.     Nose: Nose normal.  Eyes:     General:        Right eye: No discharge.        Left eye: No discharge.     Extraocular Movements: Extraocular movements intact.     Pupils: Pupils are equal, round, and reactive to light.  Cardiovascular:     Rate and Rhythm: Normal rate and regular rhythm.     Heart sounds: Normal heart sounds. No murmur heard.   Pulmonary:  Effort: Pulmonary effort is normal.     Breath sounds: Normal breath sounds. No wheezing.  Abdominal:     General: Bowel sounds are normal.     Palpations: Abdomen is soft. There is no mass.     Tenderness: There is no abdominal tenderness. There is no rebound.  Musculoskeletal:     Cervical back: Normal range of motion and  neck supple.  Skin:    General: Skin is warm and dry.     Comments: Erythema and swelling over right tibial plateau  Neurological:     Mental Status: He is alert and oriented to person, place, and time.     BP 116/60   Pulse 78   Temp 97.9 F (36.6 C)   Resp 16   Wt 147 lb 12.8 oz (67 kg)   SpO2 95%   BMI 20.61 kg/m  Wt Readings from Last 3 Encounters:  11/28/19 147 lb 12.8 oz (67 kg)  11/24/19 150 lb 9.6 oz (68.3 kg)  11/23/19 150 lb (68 kg)    Diabetic Foot Exam - Simple   No data filed     Lab Results  Component Value Date   WBC 8.9 11/28/2019   HGB 11.9 (L) 11/28/2019   HCT 35.5 (L) 11/28/2019   PLT 291.0 11/28/2019   GLUCOSE 90 11/28/2019   CHOL 91 11/28/2019   TRIG 88.0 11/28/2019   HDL 41.30 11/28/2019   LDLCALC 32 11/28/2019   ALT 5 11/28/2019   AST 12 11/28/2019   NA 140 11/28/2019   K 4.6 11/28/2019   CL 106 11/28/2019   CREATININE 1.13 11/28/2019   BUN 22 11/28/2019   CO2 29 11/28/2019   TSH 2.29 11/28/2019   PSA 0.00 Repeated and verified X2. (L) 09/22/2017   INR 1.3 (H) 10/23/2018   HGBA1C 5.5 11/28/2019    Lab Results  Component Value Date   TSH 2.29 11/28/2019   Lab Results  Component Value Date   WBC 8.9 11/28/2019   HGB 11.9 (L) 11/28/2019   HCT 35.5 (L) 11/28/2019   MCV 91.4 11/28/2019   PLT 291.0 11/28/2019   Lab Results  Component Value Date   NA 140 11/28/2019   K 4.6 11/28/2019   CO2 29 11/28/2019   GLUCOSE 90 11/28/2019   BUN 22 11/28/2019   CREATININE 1.13 11/28/2019   BILITOT 0.5 11/28/2019   ALKPHOS 46 11/28/2019   AST 12 11/28/2019   ALT 5 11/28/2019   PROT 6.0 11/28/2019   ALBUMIN 3.7 11/28/2019   CALCIUM 8.8 11/28/2019   ANIONGAP 7 11/05/2018   GFR 58.13 (L) 11/28/2019   Lab Results  Component Value Date   CHOL 91 11/28/2019   Lab Results  Component Value Date   HDL 41.30 11/28/2019   Lab Results  Component Value Date   LDLCALC 32 11/28/2019   Lab Results  Component Value Date   TRIG 88.0  11/28/2019   Lab Results  Component Value Date   CHOLHDL 2 11/28/2019   Lab Results  Component Value Date   HGBA1C 5.5 11/28/2019       Assessment & Plan:   Problem List Items Addressed This Visit    Vitamin D deficiency - Primary    Supplement and monitor      Relevant Orders   VITAMIN D 25 Hydroxy (Vit-D Deficiency, Fractures) (Completed)   HLD (hyperlipidemia)    Tolerating statin, encouraged heart healthy diet, avoid trans fats, minimize simple carbs and saturated fats. Increase exercise as tolerated  Relevant Orders   Lipid panel (Completed)   RESOLVED: HYPERTENSION, BENIGN ESSENTIAL    Well controlled, no changes to meds. Encouraged heart healthy diet such as the DASH diet and exercise as tolerated.       Relevant Orders   CBC with Differential/Platelet (Completed)   Comprehensive metabolic panel (Completed)   TSH (Completed)   Carotid artery disease ([place])    S/p carotid endarterectomy and previously followed by surgeon who has retired. Will order repeat carotid ultrasound for surveillance.       Relevant Orders   US Carotid Bilateral   Preventative health care    Patient encouraged to maintain heart healthy diet, regular exercise, adequate sleep. Consider daily probiotics. Take medications as prescribed. Labs ordered and reviewed.       Constipation    Encouraged increased hydration and fiber in diet. Daily probiotics. If bowels not moving can use MOM 2 tbls po in 4 oz of warm prune juice by mouth every 2-3 days. If no results then repeat in 4 hours with  Dulcolax suppository pr, may repeat again in 4 more hours as needed. Seek care if symptoms worsen. Consider daily Miralax and/or Dulcolax if symptoms persist. Consider daily Miralax and Benefiber dosing.       Controlled type 2 diabetes mellitus with hyperglycemia, without long-term current use of insulin (HCC)    hgba1c acceptable, minimize simple carbs. Increase exercise as tolerated. Continue current  meds      Relevant Orders   Hemoglobin A1c (Completed)   Hypoalbuminemia    Low prealbumen, he is encouraged to increase protein in diet and is referred to nutrition for further guidance.      Relevant Orders   Prealbumin (Completed)   Cellulitis    Right leg. Has been on Doxycycline and noted most improvement with dose of Rocephin. Will give a course of Cefdinir 300 mg po bid          I am having Scott Aquas. Meininger "Ray" maintain his triamcinolone cream, albuterol, polyvinyl alcohol, fluticasone, acetaminophen, polyethylene glycol, Muscle Rub, ferrous sulfate, cloNIDine, metoprolol succinate, gabapentin, atorvastatin, famotidine, ondansetron, amLODipine, apixaban, lisinopril, minoxidil, pantoprazole, metoCLOPramide, traMADol, doxycycline, and cefdinir.  Meds ordered this encounter  Medications  . DISCONTD: cefdinir (OMNICEF) 300 MG capsule    Sig: Take 1 capsule (300 mg total) by mouth 2 (two) times daily for 10 days.    Dispense:  20 capsule    Refill:  0  . cefdinir (OMNICEF) 300 MG capsule    Sig: Take 1 capsule (300 mg total) by mouth 2 (two) times daily for 10 days.    Dispense:  20 capsule    Refill:  0     Penni Homans, MD

## 2019-11-28 NOTE — Patient Instructions (Addendum)
  Miralax plus benefiber once or twice a day  How to Perform the Epley Maneuver The Epley maneuver is an exercise that relieves symptoms of vertigo. Vertigo is the feeling that you or your surroundings are moving when they are not. When you feel vertigo, you may feel like the room is spinning and have trouble walking. Dizziness is a little different than vertigo. When you are dizzy, you may feel unsteady or light-headed. You can do this maneuver at home whenever you have symptoms of vertigo. You can do it up to 3 times a day until your symptoms go away. Even though the Epley maneuver may relieve your vertigo for a few weeks, it is possible that your symptoms will return. This maneuver relieves vertigo, but it does not relieve dizziness. What are the risks? If it is done correctly, the Epley maneuver is considered safe. Sometimes it can lead to dizziness or nausea that goes away after a short time. If you develop other symptoms, such as changes in vision, weakness, or numbness, stop doing the maneuver and call your health care provider. How to perform the Epley maneuver 1. Sit on the edge of a bed or table with your back straight and your legs extended or hanging over the edge of the bed or table. 2. Turn your head halfway toward the affected ear or side. 3. Lie backward quickly with your head turned until you are lying flat on your back. You may want to position a pillow under your shoulders. 4. Hold this position for 30 seconds. You may experience an attack of vertigo. This is normal. 5. Turn your head to the opposite direction until your unaffected ear is facing the floor. 6. Hold this position for 30 seconds. You may experience an attack of vertigo. This is normal. Hold this position until the vertigo stops. 7. Turn your whole body to the same side as your head. Hold for another 30 seconds. 8. Sit back up. You can repeat this exercise up to 3 times a day. Follow these instructions at  home:  After doing the Epley maneuver, you can return to your normal activities.  Ask your health care provider if there is anything you should do at home to prevent vertigo. He or she may recommend that you: ? Keep your head raised (elevated) with two or more pillows while you sleep. ? Do not sleep on the side of your affected ear. ? Get up slowly from bed. ? Avoid sudden movements during the day. ? Avoid extreme head movement, like looking up or bending over. Contact a health care provider if:  Your vertigo gets worse.  You have other symptoms, including: ? Nausea. ? Vomiting. ? Headache. Get help right away if:  You have vision changes.  You have a severe or worsening headache or neck pain.  You cannot stop vomiting.  You have new numbness or weakness in any part of your body. Summary  Vertigo is the feeling that you or your surroundings are moving when they are not.  The Epley maneuver is an exercise that relieves symptoms of vertigo.  If the Epley maneuver is done correctly, it is considered safe. You can do it up to 3 times a day. This information is not intended to replace advice given to you by your health care provider. Make sure you discuss any questions you have with your health care provider. Document Revised: 12/11/2016 Document Reviewed: 11/19/2015 Elsevier Patient Education  2020 [employer].

## 2019-11-29 ENCOUNTER — Other Ambulatory Visit: Payer: Self-pay | Admitting: Family Medicine

## 2019-11-29 DIAGNOSIS — L0390 Cellulitis, unspecified: Secondary | ICD-10-CM | POA: Insufficient documentation

## 2019-11-29 DIAGNOSIS — R7989 Other specified abnormal findings of blood chemistry: Secondary | ICD-10-CM

## 2019-11-29 LAB — COMPREHENSIVE METABOLIC PANEL
ALT: 5 U/L (ref 0–53)
AST: 12 U/L (ref 0–37)
Albumin: 3.7 g/dL (ref 3.5–5.2)
Alkaline Phosphatase: 46 U/L (ref 39–117)
BUN: 22 mg/dL (ref 6–23)
CO2: 29 mEq/L (ref 19–32)
Calcium: 8.8 mg/dL (ref 8.4–10.5)
Chloride: 106 mEq/L (ref 96–112)
Creatinine, Ser: 1.13 mg/dL (ref 0.40–1.50)
GFR: 58.13 mL/min — ABNORMAL LOW (ref >60.00)
Glucose, Bld: 90 mg/dL (ref 70–99)
Potassium: 4.6 mEq/L (ref 3.5–5.1)
Sodium: 140 mEq/L (ref 135–145)
Total Bilirubin: 0.5 mg/dL (ref 0.2–1.2)
Total Protein: 6 g/dL (ref 6.0–8.3)

## 2019-11-29 LAB — CBC WITH DIFFERENTIAL/PLATELET
Basophils Absolute: 0.1 10*3/uL (ref 0.0–0.1)
Basophils Relative: 0.7 % (ref 0.0–3.0)
Eosinophils Absolute: 0.1 10*3/uL (ref 0.0–0.7)
Eosinophils Relative: 0.9 % (ref 0.0–5.0)
HCT: 35.5 % — ABNORMAL LOW (ref 39.0–52.0)
Hemoglobin: 11.9 g/dL — ABNORMAL LOW (ref 13.0–17.0)
Lymphocytes Relative: 12.4 % (ref 12.0–46.0)
Lymphs Abs: 1.1 10*3/uL (ref 0.7–4.0)
MCHC: 33.4 g/dL (ref 30.0–36.0)
MCV: 91.4 fl (ref 78.0–100.0)
Monocytes Absolute: 0.5 10*3/uL (ref 0.1–1.0)
Monocytes Relative: 5.3 % (ref 3.0–12.0)
Neutro Abs: 7.2 10*3/uL (ref 1.4–7.7)
Neutrophils Relative %: 80.7 % — ABNORMAL HIGH (ref 43.0–77.0)
Platelets: 291 10*3/uL (ref 150.0–400.0)
RBC: 3.89 Mil/uL — ABNORMAL LOW (ref 4.22–5.81)
RDW: 14.4 % (ref 11.5–15.5)
WBC: 8.9 10*3/uL (ref 4.0–10.5)

## 2019-11-29 LAB — LIPID PANEL
Cholesterol: 91 mg/dL (ref 0–200)
HDL: 41.3 mg/dL (ref >39.00)
LDL Cholesterol: 32 mg/dL (ref 0–99)
NonHDL: 49.55
Total CHOL/HDL Ratio: 2
Triglycerides: 88 mg/dL (ref 0.0–149.0)
VLDL: 17.6 mg/dL (ref 0.0–40.0)

## 2019-11-29 LAB — TSH: TSH: 2.29 u[IU]/mL (ref 0.35–4.50)

## 2019-11-29 LAB — HEMOGLOBIN A1C: Hgb A1c MFr Bld: 5.5 % (ref 4.6–6.5)

## 2019-11-29 LAB — VITAMIN D 25 HYDROXY (VIT D DEFICIENCY, FRACTURES): VITD: 20.85 ng/mL — ABNORMAL LOW (ref 30.00–100.00)

## 2019-11-29 LAB — PREALBUMIN: Prealbumin: 15 mg/dL — ABNORMAL LOW (ref 21–43)

## 2019-11-29 NOTE — Assessment & Plan Note (Signed)
Right leg. Has been on Doxycycline and noted most improvement with dose of Rocephin. Will give a course of Cefdinir 300 mg po bid

## 2019-11-29 NOTE — Assessment & Plan Note (Signed)
Patient encouraged to maintain heart healthy diet, regular exercise, adequate sleep. Consider daily probiotics. Take medications as prescribed. Labs ordered and reviewed 

## 2019-11-29 NOTE — Assessment & Plan Note (Signed)
Encouraged increased hydration and fiber in diet. Daily probiotics. If bowels not moving can use MOM 2 tbls po in 4 oz of warm prune juice by mouth every 2-3 days. If no results then repeat in 4 hours with  Dulcolax suppository pr, may repeat again in 4 more hours as needed. Seek care if symptoms worsen. Consider daily Miralax and/or Dulcolax if symptoms persist. Consider daily Miralax and Benefiber dosing.

## 2019-11-29 NOTE — Assessment & Plan Note (Signed)
S/p carotid endarterectomy and previously followed by surgeon who has retired. Will order repeat carotid ultrasound for surveillance.

## 2019-11-29 NOTE — Assessment & Plan Note (Signed)
Well controlled, no changes to meds. Encouraged heart healthy diet such as the DASH diet and exercise as tolerated.  °

## 2019-11-29 NOTE — Assessment & Plan Note (Signed)
Supplement and monitor 

## 2019-11-29 NOTE — Assessment & Plan Note (Signed)
hgba1c acceptable, minimize simple carbs. Increase exercise as tolerated. Continue current meds 

## 2019-11-29 NOTE — Assessment & Plan Note (Signed)
Tolerating statin, encouraged heart healthy diet, avoid trans fats, minimize simple carbs and saturated fats. Increase exercise as tolerated 

## 2019-11-29 NOTE — Assessment & Plan Note (Signed)
Low prealbumen, he is encouraged to increase protein in diet and is referred to nutrition for further guidance.

## 2019-11-30 ENCOUNTER — Other Ambulatory Visit: Payer: Self-pay

## 2019-11-30 DIAGNOSIS — E559 Vitamin D deficiency, unspecified: Secondary | ICD-10-CM

## 2019-11-30 MED ORDER — VITAMIN D (ERGOCALCIFEROL) 1.25 MG (50000 UNIT) PO CAPS: 50000.0000 [IU] | ORAL_CAPSULE | ORAL | 4 refills | Status: DC

## 2019-11-30 MED FILL — VIT D2 1.25 MG (50,000 UNIT: 1.25 MG | 28 days supply | Qty: 4 | Fill #0

## 2019-11-30 NOTE — Progress Notes (Signed)
Called left vm message informing of new vitamin

## 2019-12-01 ENCOUNTER — Telehealth: Payer: Self-pay

## 2019-12-01 ENCOUNTER — Other Ambulatory Visit: Payer: Self-pay | Admitting: Internal Medicine

## 2019-12-01 ENCOUNTER — Encounter: Payer: Self-pay | Admitting: Internal Medicine

## 2019-12-01 ENCOUNTER — Ambulatory Visit (INDEPENDENT_AMBULATORY_CARE_PROVIDER_SITE_OTHER): Payer: PPO | Admitting: Internal Medicine

## 2019-12-01 VITALS — BP 130/56 | HR 68 | Ht 67.0 in | Wt 150.2 lb

## 2019-12-01 DIAGNOSIS — R634 Abnormal weight loss: Secondary | ICD-10-CM | POA: Diagnosis not present

## 2019-12-01 DIAGNOSIS — K5909 Other constipation: Secondary | ICD-10-CM

## 2019-12-01 DIAGNOSIS — R110 Nausea: Secondary | ICD-10-CM | POA: Insufficient documentation

## 2019-12-01 DIAGNOSIS — R9389 Abnormal findings on diagnostic imaging of other specified body structures: Secondary | ICD-10-CM

## 2019-12-01 MED ORDER — METOCLOPRAMIDE HCL 5 MG PO TABS
5.0000 mg | ORAL_TABLET | Freq: Every day | ORAL | 5 refills | Status: DC
Start: 2019-12-01 — End: 2019-12-01

## 2019-12-01 MED ORDER — PANTOPRAZOLE SODIUM 40 MG PO TBEC
DELAYED_RELEASE_TABLET | ORAL | 3 refills | Status: DC
Start: 2019-12-01 — End: 2020-03-12

## 2019-12-01 MED FILL — METOCLOPRAMIDE 5 MG TABLET: 5 | 30 days supply | Qty: 30 | Fill #0

## 2019-12-01 NOTE — Progress Notes (Signed)
[name] 86 y.o. 07/20/1929 [id number]  Assessment & Plan:   Encounter Diagnoses  Name Primary?   Chronic nausea Yes   Other constipation    Loss of weight    Abnormal chest CT     His GI symptoms seem relatively stable to me.  He has lost a lot of weight since last year though it might be leveling at this point.  I agree that pulmonary referral makes sense, I did not see that when he was here and I will call to reinforce that with his wife as well.  I have seen patients with chronic MAI have a chronic nausea and weight loss issue so it could be helpful to have that treated if present.  Though he is of advanced age he seems pretty functional all things considered and it would not be wrong to pursue evaluation.  He is tolerating metoclopramide we will refill that and his pantoprazole today.  I will see him in 6 months or so maybe up to a year.  I have explained again to them that tardive dyskinesia can develop from metoclopramide and I do need to follow him if I am going to continue to prescribe that medication.  Fortunately he is on a very low dose and only at bedtime.  Meds ordered this encounter  Medications   metoCLOPramide (REGLAN) 5 MG tablet    Sig: Take 1 tablet (5 mg total) by mouth at bedtime.    Dispense:  30 tablet    Refill:  5   pantoprazole (PROTONIX) 40 MG tablet    Sig: TAKE 1 TABLET BY MOUTH ONCE A DAY BEFORE BREAKFAST (NEEDS OFFICE VISIT)    Dispense:  90 tablet    Refill:  3    I appreciate the opportunity to care for this patient. CC: Scott Lukes, MD   Subjective:   Chief Complaint: Follow-up of nausea on metoclopramide and constipation  HPI [name] is here with his wife [name] for a follow-up on chronic metoclopramide.  I started that back in 2018 when he had chronic nausea and it was the only thing that seemed to work.  He takes a low dose and tolerates it.  No paresthesias neuropathy changes or tardive dyskinesia complaints.  He does have  some irregular defecation at times and may go up to 3 days without defecation which is treated with milk of magnesia.  Is on MiraLAX.  [name] has him take stool softeners most days as well.  He is not bothered by his bowel habit.  Last colonoscopy 2011 by Dr. Delfin Harmon, normal except for a small sessile polyp in the ascending colon.  He was struggling with bowel issues and weight loss then.  There has been some weight loss since last year he is taking some Ensure and sometimes muscle milk to try to increase protein.  Prealbumin was slightly low at recent primary care labs. Wt Readings from Last 3 Encounters:  12/01/19 150 lb 4 oz (68.2 kg)  11/28/19 147 lb 12.8 oz (67 kg)  11/24/19 150 lb 9.6 oz (68.3 kg)  76 kg October 2020  Other recent labs hemoglobin 11.9 hemoglobin A1c 5.5 hemoglobin was 11.2 in May H pylori breath test and serology has been negative LFTs electrolytes kidney function normal  In June he was in primary care because of nausea mild left lower quadrant pain and some weight loss, he had a weakly positive Hemoccult stool with a history of black stools on iron then.  CT of the abdomen and pelvis showed no intraabdominal process going on that was acute a small left pleural effusion, mild centrilobular micronodularity in the right middle lobe thought to be infectious or inflammatory and aortic atherosclerosis.  A CT of the chest to followed up in August showed a moderate layering left pleural effusion and a fine reticular pattern in the right middle lobe that could be consistent with something like MAI. He was appropriately referred to pulmonary but did not follow-up with that as best I can tell. Allergies  Allergen Reactions   Hctz [Hydrochlorothiazide] Shortness Of Breath, Swelling and Other (See Comments)    "Swelling and dyspnea"    Hydralazine Shortness Of Breath and Other (See Comments)    Chest pain and GI issues also   Diovan [Valsartan] Other (See Comments)     Elevated potassium- Hyperkalemia   Metformin And Related Other (See Comments)    Reaction not recalled   Nsaids Other (See Comments)    Other than Tylenol, he isn't to have these because he's on Eliquis   Other Other (See Comments)    Unnamed topically-applied B/P patch = Caused redness   Prednisone Other (See Comments)    Suicidal thoughts   Spironolactone Swelling    Site of swelling not recalled   Codeine Itching and Nausea Only   Oxycodone-Acetaminophen Itching and Nausea Only   Current Meds  Medication Sig   acetaminophen (TYLENOL) 500 MG tablet Take 500 mg by mouth every 6 (six) hours as needed for mild pain or headache.   albuterol (PROVENTIL HFA;VENTOLIN HFA) 108 (90 Base) MCG/ACT inhaler Inhale 1-2 puffs into the lungs every 6 (six) hours as needed for wheezing or shortness of breath.   amLODipine (NORVASC) 5 MG tablet Take 2 tablets (10 mg total) by mouth daily.   apixaban (ELIQUIS) 5 MG TABS tablet Take 1 tablet (5 mg total) by mouth 2 (two) times daily.   atorvastatin (LIPITOR) 40 MG tablet TAKE 1 TABLET BY MOUTH DAILY.   cefdinir (OMNICEF) 300 MG capsule Take 1 capsule (300 mg total) by mouth 2 (two) times daily for 10 days.   cloNIDine (CATAPRES) 0.1 MG tablet Take 1 tablet (0.1 mg total) by mouth 2 (two) times daily.   famotidine (PEPCID) 40 MG tablet Take 1 tablet (40 mg total) by mouth daily. (Patient taking differently: Take 40 mg by mouth as needed for heartburn or indigestion. )   ferrous sulfate (FERROUSUL) 325 (65 FE) MG tablet Take 1 tablet (325 mg total) by mouth 3 (three) times daily with meals for 14 days.   fluticasone (FLOVENT HFA) 110 MCG/ACT inhaler Inhale 2 puffs into the lungs 2 (two) times daily.   gabapentin (NEURONTIN) 300 MG capsule TAKE 1 CAPSULE BY MOUTH 3 TIMES DAILY   lisinopril (ZESTRIL) 2.5 MG tablet TAKE 1 TABLET BY MOUTH DAILY.   Menthol-Methyl Salicylate (MUSCLE RUB) 10-15 % CREA Apply 1 application topically 3 (three) times  daily with meals. To BLE   metoCLOPramide (REGLAN) 5 MG tablet Take 1 tablet (5 mg total) by mouth at bedtime.   metoprolol succinate (TOPROL-XL) 25 MG 24 hr tablet TAKE 1/2 TABLET BY MOUTH DAILY   minoxidil (LONITEN) 10 MG tablet TAKE 1 TABLET BY MOUTH TWICE DAILY   ondansetron (ZOFRAN ODT) 8 MG disintegrating tablet Take 1 tablet (8 mg total) by mouth every 8 (eight) hours as needed for nausea or vomiting.   pantoprazole (PROTONIX) 40 MG tablet TAKE 1 TABLET BY MOUTH ONCE A DAY BEFORE BREAKFAST (  NEEDS OFFICE VISIT)   polyethylene glycol (MIRALAX / GLYCOLAX) 17 g packet Take 17 g by mouth 2 (two) times daily.   polyvinyl alcohol (ARTIFICIAL TEARS) 1.4 % ophthalmic solution Place 1 drop into both eyes as needed for dry eyes.    traMADol (ULTRAM) 50 MG tablet TAKE 1 TABLET BY MOUTH 3 TIMES DAILY AS NEEDED FOR MODERATE PAIN   triamcinolone cream (KENALOG) 0.5 % Apply 1 application topically daily as needed (for skin irritation).    Vitamin D, Ergocalciferol, (DRISDOL) 1.25 MG (50000 UNIT) CAPS capsule Take 1 capsule (50,000 Units total) by mouth every 7 (seven) days.   [DISCONTINUED] metoCLOPramide (REGLAN) 5 MG tablet Take 1 tablet (5 mg total) by mouth at bedtime.   [DISCONTINUED] pantoprazole (PROTONIX) 40 MG tablet TAKE 1 TABLET BY MOUTH ONCE A DAY BEFORE BREAKFAST (NEEDS OFFICE VISIT)   Past Medical History:  Diagnosis Date   Acute bronchitis 06/10/2015   Allergic rhinitis    Anemia    Asthma    Carotid stenosis    a. s/p Left CEA 2002;  b. Carotid US 3/16:  patent R CEA, L < 40%   Chronic pain syndrome    Left shoulder, back   Diverticulosis    Dyslipidemia    Epigastric pain 04/05/2016   Esophageal stricture    Distal, benign   GERD (gastroesophageal reflux disease)    Hemorrhoids    HTN (hypertension)    Negative renal duplex 07-22-11   Hx of echocardiogram    a. 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 - on chronic Plavix/ASA   Past Surgical History:  Procedure Laterality Date   APPENDECTOMY  1953   CAROTID ENDARTERECTOMY Right 01/2000   CATARACT EXTRACTION Bilateral    CERVICAL [place] SURGERY  8/07   HIP ARTHROPLASTY Left 10/24/2018   Procedure: ARTHROPLASTY BIPOLAR HIP (HEMIARTHROPLASTY);  Surgeon: Paralee Cancel, MD;  Location: [facility];  Service: Orthopedics;  Laterality: Left;   INSERTION PROSTATE RADIATION SEED  2000   LUMBAR DISC SURGERY  1990s   NASAL SINUS SURGERY     x 3   Social History   Social History Narrative   Retired lives at home w/ his wife   1 daughter   Right-handed    Caffeine: drinks a lot of coffee    Former smoker, former alcoholic abstinent for many years   family history includes Alcohol abuse in his brother; Cancer in his brother; Diabetes in his son; Gout in his son; Hyperlipidemia in his mother; Hypertension in his mother; Lung disease in his father; Obesity in his sister; Stroke in his brother, mother, and sister.   Review of Systems As per HPI  Objective:   Physical Exam BP (!) 130/56 (BP Location: Left Arm, Patient Position: Sitting, Cuff Size: Normal)    Pulse 68    Ht 5\' 7"  (1.702 m) Comment: height measured without shoes   Wt 150 lb 4 oz (68.2 kg)    BMI 23.53 kg/m  Thin elderly white man looking reasonably well but mildly chronically ill There are no signs of tardive dyskinesia

## 2019-12-01 NOTE — Telephone Encounter (Signed)
Contacted pt informed ultrasound was ordered.  Informed pt to schedule appointment

## 2019-12-01 NOTE — Telephone Encounter (Signed)
-----   Message from [name], MD sent at 11/30/2019 11:25 PM EST ----- Regarding: RE: ultrasound Ye I ordered a carotid ultrasound for him please let them know when they can schedule it. Thanks ----- Message ----- From: Nicholaus Corolla, CMA Sent: 11/30/2019   1:34 PM EST To: [name], MD Subject: ultrasound                                     Hey Mrs. Schroth was getting her results for her lab and mentioned about her husbands's ultrasound that was supposed to be ordered. I didn't see an ultrasound ordered so I was wondering if you could order for him.

## 2019-12-01 NOTE — Patient Instructions (Addendum)
We have sent the following medications to your pharmacy for you to pick up at your convenience: Reglan, pantoprazole   Please follow up with Dr Carlean Purl in 6-12 months or sooner if needed.   I appreciate the opportunity to care for you. Silvano Rusk, MD, [facility]

## 2019-12-04 ENCOUNTER — Ambulatory Visit (HOSPITAL_BASED_OUTPATIENT_CLINIC_OR_DEPARTMENT_OTHER): Admission: RE | Admit: 2019-12-04 | Payer: PPO | Source: Ambulatory Visit

## 2019-12-04 MED FILL — ELIQUIS 5 MG TABLET: 5 | 30 days supply | Qty: 60 | Fill #5

## 2019-12-04 MED FILL — MINOXIDIL 10 MG TABLET: 10 | 90 days supply | Qty: 180 | Fill #1

## 2019-12-05 ENCOUNTER — Ambulatory Visit (HOSPITAL_BASED_OUTPATIENT_CLINIC_OR_DEPARTMENT_OTHER): Payer: PPO

## 2019-12-08 ENCOUNTER — Other Ambulatory Visit: Payer: Self-pay | Admitting: Family Medicine

## 2019-12-09 ENCOUNTER — Other Ambulatory Visit: Payer: Self-pay | Admitting: Internal Medicine

## 2019-12-09 NOTE — Telephone Encounter (Signed)
Requesting: tramadol Contract:06/22/19 UDS:05/29/19 Last Visit:12/01/19 Next Visit:n/a Last Refill:11/07/19  Please Advise

## 2019-12-10 ENCOUNTER — Other Ambulatory Visit: Payer: Self-pay | Admitting: Family Medicine

## 2019-12-11 ENCOUNTER — Other Ambulatory Visit: Payer: Self-pay

## 2019-12-11 ENCOUNTER — Other Ambulatory Visit: Payer: Self-pay | Admitting: Internal Medicine

## 2019-12-11 ENCOUNTER — Ambulatory Visit (HOSPITAL_BASED_OUTPATIENT_CLINIC_OR_DEPARTMENT_OTHER)
Admission: RE | Admit: 2019-12-11 | Discharge: 2019-12-11 | Disposition: A | Discharge status: Home / Self Care | Payer: PPO | Source: Ambulatory Visit | Attending: Family Medicine | Admitting: Family Medicine

## 2019-12-11 DIAGNOSIS — I779 Disorder of arteries and arterioles, unspecified: Secondary | ICD-10-CM | POA: Insufficient documentation

## 2019-12-11 DIAGNOSIS — I6523 Occlusion and stenosis of bilateral carotid arteries: Secondary | ICD-10-CM | POA: Diagnosis not present

## 2019-12-11 DIAGNOSIS — I10 Essential (primary) hypertension: Secondary | ICD-10-CM | POA: Diagnosis not present

## 2019-12-11 MED FILL — traMADol HCL 50 MG TABS: 50 | 30 days supply | Qty: 90 | Fill #0

## 2019-12-11 MED FILL — METOPROLOL SUCCINATE ER 25: 25 | 90 days supply | Qty: 45 | Fill #0

## 2019-12-11 MED FILL — CloNIDine HCL 0.1 MG TAB: 0.1 | 90 days supply | Qty: 180 | Fill #0

## 2019-12-15 MED FILL — AMLODIPINE BESYLATE 5 MG TA: 5 | 90 days supply | Qty: 180 | Fill #2

## 2019-12-26 ENCOUNTER — Telehealth: Payer: Self-pay | Admitting: *Deleted

## 2019-12-26 NOTE — Telephone Encounter (Signed)
Patient wife asked about patient results of his carotid ultrasound that was done on 12/11/19, she has not heard any results yet.  Results given to patient.

## 2020-01-03 MED FILL — ELIQUIS 5 MG TABLET: 5 | 30 days supply | Qty: 60 | Fill #6

## 2020-01-03 MED FILL — METOCLOPRAMIDE 5 MG TABLET: 5 | 30 days supply | Qty: 30 | Fill #1

## 2020-01-08 ENCOUNTER — Other Ambulatory Visit: Payer: Self-pay | Admitting: Family Medicine

## 2020-01-08 MED FILL — traMADol HCL 50 MG TABS: 50 | 30 days supply | Qty: 90 | Fill #0

## 2020-01-08 NOTE — Telephone Encounter (Signed)
Requesting: tramadol 50mg  Contract: 05/29/2019 UDS: 05/29/2019 Last Visit: 11/24/2019 w/ Percell Miller Next Visit: None Last Refill: 12/10/2019 #90 and 0RF Pt sig: 1 tab tid prn  Please Advise

## 2020-01-15 MED FILL — ELIQUIS 5 MG TABLET: 5 | 30 days supply | Qty: 60 | Fill #6

## 2020-01-18 ENCOUNTER — Other Ambulatory Visit: Payer: Self-pay | Admitting: *Deleted

## 2020-01-18 ENCOUNTER — Other Ambulatory Visit: Payer: Self-pay | Admitting: Family Medicine

## 2020-01-18 DIAGNOSIS — E559 Vitamin D deficiency, unspecified: Secondary | ICD-10-CM

## 2020-01-18 MED ORDER — VITAMIN D (ERGOCALCIFEROL) 1.25 MG (50000 UNIT) PO CAPS: 50000.0000 [IU] | ORAL_CAPSULE | ORAL | 0 refills | Status: DC

## 2020-01-18 MED FILL — VIT D2 1.25 MG (50,000 UNIT: 1.25 MG | 84 days supply | Qty: 12 | Fill #0

## 2020-01-18 MED FILL — PANTOPRAZOLE SOD DR 40 MG T: 40 | 90 days supply | Qty: 90 | Fill #0

## 2020-02-05 ENCOUNTER — Other Ambulatory Visit: Payer: Self-pay | Admitting: Family Medicine

## 2020-02-05 DIAGNOSIS — I10 Essential (primary) hypertension: Secondary | ICD-10-CM

## 2020-02-05 DIAGNOSIS — E559 Vitamin D deficiency, unspecified: Secondary | ICD-10-CM

## 2020-02-05 DIAGNOSIS — E782 Mixed hyperlipidemia: Secondary | ICD-10-CM

## 2020-02-05 DIAGNOSIS — I6350 Cerebral infarction due to unspecified occlusion or stenosis of unspecified cerebral artery: Secondary | ICD-10-CM

## 2020-02-05 MED FILL — ATORVASTATIN 40 MG TABLET: 40 | 90 days supply | Qty: 90 | Fill #0

## 2020-02-05 MED FILL — LISINOPRIL 2.5 MG TABLET: 2.5 | 90 days supply | Qty: 90 | Fill #0

## 2020-02-06 ENCOUNTER — Other Ambulatory Visit: Payer: Self-pay | Admitting: Family Medicine

## 2020-02-06 MED FILL — traMADol HCL 50 MG TABS: 50 | 30 days supply | Qty: 90 | Fill #0

## 2020-02-06 NOTE — Telephone Encounter (Signed)
Requesting: tramadol 50mg   Contract: 05/29/2019  UDS: 05/29/2019 Last Visit: 11/28/2019 Next Visit: None Last Refill: 01/08/2020 #90 and 0RF Pt sig: 1 tab tid prn  Please Advise

## 2020-02-07 MED FILL — ELIQUIS 5 MG TABLET: 5 | 30 days supply | Qty: 60 | Fill #7

## 2020-02-09 DIAGNOSIS — Z1152 Encounter for screening for COVID-19: Secondary | ICD-10-CM | POA: Diagnosis not present

## 2020-02-09 DIAGNOSIS — Z20828 Contact with and (suspected) exposure to other viral communicable diseases: Secondary | ICD-10-CM | POA: Diagnosis not present

## 2020-02-12 ENCOUNTER — Telehealth: Payer: Self-pay

## 2020-02-12 NOTE — Telephone Encounter (Signed)
Patient wants to know what to do until their appointment tomorrow , patient only has fever . Suggested tylenol as a fever reducer and also plenty of fluids and if symptoms get worse then UC or ED if cant wait until appointment at 240 tomorrow . Patient would like a call back with instructions

## 2020-02-13 ENCOUNTER — Other Ambulatory Visit: Payer: Self-pay

## 2020-02-13 ENCOUNTER — Telehealth (INDEPENDENT_AMBULATORY_CARE_PROVIDER_SITE_OTHER): Payer: PPO | Admitting: Medical

## 2020-02-13 ENCOUNTER — Other Ambulatory Visit: Payer: Self-pay | Admitting: Medical

## 2020-02-13 VITALS — HR 96 | Temp 99.5°F

## 2020-02-13 DIAGNOSIS — R059 Cough, unspecified: Secondary | ICD-10-CM

## 2020-02-13 DIAGNOSIS — U071 COVID-19: Secondary | ICD-10-CM

## 2020-02-13 DIAGNOSIS — R0989 Other specified symptoms and signs involving the circulatory and respiratory systems: Secondary | ICD-10-CM

## 2020-02-13 DIAGNOSIS — R509 Fever, unspecified: Secondary | ICD-10-CM

## 2020-02-13 MED ORDER — ALBUTEROL SULFATE HFA 108 (90 BASE) MCG/ACT IN AERS: 2.0000 | INHALATION_SPRAY | Freq: Four times a day (QID) | RESPIRATORY_TRACT | 0 refills | Status: DC | PRN

## 2020-02-13 MED ORDER — AZITHROMYCIN 250 MG PO TABS: ORAL_TABLET | ORAL | 0 refills | Status: DC

## 2020-02-13 MED FILL — AZITHROMYCIN 250 MG TABLET: 250 | 5 days supply | Qty: 6 | Fill #0

## 2020-02-13 MED FILL — ALBUTEROL SULFATE HFA 108 (: 108 (90 BAS | 25 days supply | Qty: 18 | Fill #0

## 2020-02-13 NOTE — Progress Notes (Signed)
Subjective:    Patient ID: [name], male    DOB: 1929/08/27, 87 y.o.   MRN: [id number]  HPI  Virtual Visit via Telephone Note  I connected with [name] on 02/13/20 at  2:40 PM EST by telephone and verified that I am speaking with the correct person using two identifiers.  Location: Patient: home Provider: office  participants- pt, wife and myself.    I discussed the limitations, risks, security and privacy concerns of performing an evaluation and management service by telephone and the availability of in person appointments. I also discussed with the patient that there may be a patient responsible charge related to this service. The patient expressed understanding and agreed to proceed.   History of Present Illness:  Pt had recent fever 100.5 on Sunday night. States mostly at night. No med for fever today other tylenol.  Pt does have some diffuse body aches. No ha. Does have runny nose. No cough.  Pt had covid test done at [facility]. He tested on Friday after runny nose. Pt states wife is sick. Her rapid test came back + and she has pcr test that is pending.   Pt has been vaccinated and boosted.   Pt has cough that is non productive. Wife who is former Therapist, sports states on ausculation he has no wheezing.  His 02 sat is 94%. Usually is 96% per wife. No sob on ambulation.    Observations/Objective:  General- no acute distress, pleasant, alert and oriented.    Assessment and Plan: Patient has recent cough, runny nose, fever and oxygen saturation 94%.   He has received 2 Covid vaccines and booster.  Tested negative by PCR recently but he started test on first day of symptoms.  Considering falls negative test.  Note his wife tested positive with rapid test and she has a PCR test is still pending.  Wife has very minimal symptoms.  Advised rest, hydrate, Tylenol for fever.  Prescribing benzonatate for cough and albuterol if needed for shortness of breath or wheezing.   Prescribing a azithromycin antibiotic in the event he is productive cough or starts to feel chest congestion.  Otherwise advised not to start antibiotic.  Asked patient to get over-the-counter rapid test and repeat test today and again tomorrow morning.  As do have concern that he might have tested early false-negative by PCR.  If repeat rapid test positive then I did explain that could prescribe molnupiravir.   Follow up in 7 days or as needed. Update me on rapid covid test results.  Mackie Pai, PA-C   Time spent with patient today was 30  minutes which consisted of chart review, discussing diagnosis, work up, treatment and documentation.  Follow Up Instructions:    I discussed the assessment and treatment plan with the patient. The patient was provided an opportunity to ask questions and all were answered. The patient agreed with the plan and demonstrated an understanding of the instructions.   The patient was advised to call back or seek an in-person evaluation if the symptoms worsen or if the condition fails to improve as anticipated.  Time spent with patient today was 20 minutes which consisted of chart revdiew, discussing diagnosis, work up treatment and documentation.   Mackie Pai, PA-C   Review of Systems  Constitutional: Positive for fever. Negative for chills and fatigue.  HENT: Positive for rhinorrhea. Negative for congestion.   Respiratory: Negative for cough, chest tightness, shortness of breath and wheezing.  Cardiovascular: Negative for chest pain and palpitations.  Gastrointestinal: Negative for abdominal pain.  Genitourinary: Negative for difficulty urinating, flank pain, frequency, hematuria and penile swelling.  Musculoskeletal: Negative for back pain, gait problem and joint swelling.  Neurological: Negative for dizziness, syncope, weakness, light-headedness, numbness and headaches.  Hematological: Negative for adenopathy. Does not bruise/bleed easily.   Psychiatric/Behavioral: Negative for behavioral problems and confusion.    Past Medical History:  Diagnosis Date  . Acute bronchitis 06/10/2015  . Allergic rhinitis   . Anemia   . Asthma   . Carotid stenosis    a. s/p Left CEA 2002;  b. Carotid US 3/16:  patent R CEA, L < 40%  . Chronic pain syndrome    Left shoulder, back  . Diverticulosis   . Dyslipidemia   . Epigastric pain 04/05/2016  . Esophageal stricture    Distal, benign  . GERD (gastroesophageal reflux disease)   . Hemorrhoids   . HTN (hypertension)    Negative renal duplex 07-22-11  . Hx of echocardiogram    a. Echo 10/11: mod LVH, EF 55-60%  . Prostate cancer ([place]) 2000   Seed XRT  . Spondylosis, cervical, with myelopathy 11/26/2015  . Stroke [facility]) 8/01   right thalamic - on chronic Plavix/ASA     Social History   Socioeconomic History  . Marital status: Married    Spouse name: Not on file  . Number of children: 1  . Years of education: 30  . Highest education level: Not on file  Occupational History  . Occupation: Retired  Tobacco Use  . Smoking status: Former Smoker    Packs/day: 0.25    Years: 35.00    Pack years: 8.75    Types: Cigarettes    Quit date: 01/12/1978    Years since quitting: 42.1  . Smokeless tobacco: Current User    Types: Chew  Substance and Sexual Activity  . Alcohol use: No    Comment: Prior alcoholic, sober since 2595  . Drug use: No  . Sexual activity: Not Currently  Other Topics Concern  . Not on file  Social History Narrative   Retired lives at home w/ his wife   1 daughter   Right-handed    Caffeine: drinks a lot of coffee    Former smoker, former alcoholic abstinent for many years   Social Determinants of Radio broadcast assistant Strain: Not on file  Food Insecurity: Not on file  Transportation Needs: Not on file  Physical Activity: Not on file  Stress: Not on file  Social Connections: Not on file  Intimate Partner Violence: Not on file    Past Surgical  History:  Procedure Laterality Date  . APPENDECTOMY  1953  . CAROTID ENDARTERECTOMY Right 01/2000  . CATARACT EXTRACTION Bilateral   . CERVICAL DISC SURGERY  8/07  . HIP ARTHROPLASTY Left 10/24/2018   Procedure: ARTHROPLASTY BIPOLAR HIP (HEMIARTHROPLASTY);  Surgeon: Paralee Cancel, MD;  Location: [place];  Service: Orthopedics;  Laterality: Left;  . INSERTION PROSTATE RADIATION SEED  2000  . [place] SURGERY  1990s  . NASAL SINUS SURGERY     x 3    Family History  Problem Relation Age of Onset  . Stroke Brother   . Stroke Sister   . Stroke Mother   . Hyperlipidemia Mother   . Hypertension Mother   . Lung disease Father   . Diabetes Son   . Gout Son   . Obesity Sister   . Alcohol abuse  Brother   . Cancer Brother        lung, smoker    Allergies  Allergen Reactions  . Hctz [Hydrochlorothiazide] Shortness Of Breath, Swelling and Other (See Comments)    "Swelling and dyspnea"   . Hydralazine Shortness Of Breath and Other (See Comments)    Chest pain and GI issues also  . Diovan [Valsartan] Other (See Comments)    Elevated potassium- Hyperkalemia  . Metformin And Related Other (See Comments)    Reaction not recalled  . Nsaids Other (See Comments)    Other than Tylenol, he isn't to have these because he's on Eliquis  . Other Other (See Comments)    Unnamed topically-applied B/P patch = Caused redness  . Prednisone Other (See Comments)    Suicidal thoughts  . Spironolactone Swelling    Site of swelling not recalled  . Codeine Itching and Nausea Only  . Oxycodone-Acetaminophen Itching and Nausea Only    Current Outpatient Medications on File Prior to Visit  Medication Sig Dispense Refill  . acetaminophen (TYLENOL) 500 MG tablet Take 500 mg by mouth every 6 (six) hours as needed for mild pain or headache.    . albuterol (PROVENTIL HFA;VENTOLIN HFA) 108 (90 Base) MCG/ACT inhaler Inhale 1-2 puffs into the lungs every 6 (six) hours as needed for wheezing or shortness of  breath. 1 Inhaler 6  . amLODipine (NORVASC) 5 MG tablet Take 2 tablets (10 mg total) by mouth daily. 180 tablet 2  . apixaban (ELIQUIS) 5 MG TABS tablet Take 1 tablet (5 mg total) by mouth 2 (two) times daily. 180 tablet 2  . atorvastatin (LIPITOR) 40 MG tablet Take 1 tablet (40 mg total) by mouth daily. 90 tablet 1  . cloNIDine (CATAPRES) 0.1 MG tablet TAKE 1 TABLET BY MOUTH 2 TIMES DAILY 180 tablet 3  . famotidine (PEPCID) 40 MG tablet Take 1 tablet (40 mg total) by mouth daily. (Patient taking differently: Take 40 mg by mouth as needed for heartburn or indigestion. ) 30 tablet 2  . ferrous sulfate (FERROUSUL) 325 (65 FE) MG tablet Take 1 tablet (325 mg total) by mouth 3 (three) times daily with meals for 14 days. 90 tablet 2  . fluticasone (FLOVENT HFA) 110 MCG/ACT inhaler Inhale 2 puffs into the lungs 2 (two) times daily. 1 Inhaler 0  . gabapentin (NEURONTIN) 300 MG capsule TAKE 1 CAPSULE BY MOUTH 3 TIMES DAILY 270 capsule 1  . lisinopril (ZESTRIL) 2.5 MG tablet Take 1 tablet (2.5 mg total) by mouth daily. 90 tablet 1  . Menthol-Methyl Salicylate (MUSCLE RUB) 10-15 % CREA Apply 1 application topically 3 (three) times daily with meals. To BLE 85 g 0  . metoCLOPramide (REGLAN) 5 MG tablet Take 1 tablet (5 mg total) by mouth at bedtime. 30 tablet 5  . metoprolol succinate (TOPROL-XL) 25 MG 24 hr tablet TAKE 1/2 TABLET BY MOUTH DAILY 45 tablet 3  . minoxidil (LONITEN) 10 MG tablet TAKE 1 TABLET BY MOUTH TWICE DAILY 180 tablet 3  . ondansetron (ZOFRAN ODT) 8 MG disintegrating tablet Take 1 tablet (8 mg total) by mouth every 8 (eight) hours as needed for nausea or vomiting. 20 tablet 0  . pantoprazole (PROTONIX) 40 MG tablet TAKE 1 TABLET BY MOUTH ONCE A DAY BEFORE BREAKFAST (NEEDS OFFICE VISIT) 90 tablet 3  . polyethylene glycol (MIRALAX / GLYCOLAX) 17 g packet Take 17 g by mouth 2 (two) times daily. 28 packet 0  . polyvinyl alcohol (ARTIFICIAL TEARS) 1.4 % ophthalmic  solution Place 1 drop into both  eyes as needed for dry eyes.     . traMADol (ULTRAM) 50 MG tablet TAKE 1 TABLET BY MOUTH 3 TIMES DAILY AS NEEDED FOR MODERATE PAIN 90 tablet 0  . triamcinolone cream (KENALOG) 0.5 % Apply 1 application topically daily as needed (for skin irritation).     . Vitamin D, Ergocalciferol, (DRISDOL) 1.25 MG (50000 UNIT) CAPS capsule Take 1 capsule (50,000 Units total) by mouth every 7 (seven) days. 12 capsule 0   No current facility-administered medications on file prior to visit.    Pulse 96   Temp 99.5 F (37.5 C)   SpO2 94%       Objective:   Physical Exam        Assessment & Plan:

## 2020-02-13 NOTE — Telephone Encounter (Signed)
Agree can use tylenol. Will discuss further plans on virtual visit.

## 2020-02-13 NOTE — Patient Instructions (Addendum)
Patient has recent cough, runny nose, fever and oxygen saturation 94%.   He has received 2 Covid vaccines and booster.  Tested negative by PCR recently but he started test on first day of symptoms.  Considering falls negative test.  Note his wife tested positive with rapid test and she has a PCR test is still pending.  Wife has very minimal symptoms.  Advised rest, hydrate, Tylenol for fever.  Prescribing benzonatate for cough and albuterol if needed for shortness of breath or wheezing.  Prescribing a azithromycin antibiotic in the event he is productive cough or starts to feel chest congestion.  Otherwise advised not to start antibiotic.  Asked patient to get over-the-counter rapid test and repeat test today and again tomorrow morning.  As do have concern that he might have tested early false-negative by PCR.  If repeat rapid test positive then I did explain that could prescribe molnupiravir.   Follow up in 7 days or as needed. Update me on rapid covid test results.

## 2020-02-14 ENCOUNTER — Telehealth: Payer: Self-pay | Admitting: Medical

## 2020-02-14 ENCOUNTER — Telehealth: Payer: Self-pay | Admitting: Family Medicine

## 2020-02-14 NOTE — Telephone Encounter (Signed)
Patient call to check the status

## 2020-02-14 NOTE — Telephone Encounter (Signed)
Patient have covid 19, patient wanted to make provider aware of that. Patient's spouse is also requesting another medication for his viral infection, medication prescribed per patient spouse  is for bacteria infection   azithromycin (ZITHROMAX) 250 MG tablet [[id number]]

## 2020-02-14 NOTE — Telephone Encounter (Signed)
Will you help get pt in post covid clinic tomorrow. If kim is here tomorrow coordinate with her.   Pt 87 yo. Vaccinated and boost but covid +,  fever, 94% o2 and want evluated for possible molnupiravir.  Number is [phone].

## 2020-02-14 NOTE — Telephone Encounter (Signed)
Will you let pt know that I am going to refer him to post covid clinic. Will you call over to clinic [phone] or have Kim set him up this week.  Let him know new medication is out that is antiviral but we got instruction to refer pt to covid treatment center to have them prescribe and evlaluate pt.  He had question on azithromycin. Yes this is antibiotic. Sometimes pt get secondary bacterial infection with covid.   Pt wife is sick as well. She needs to have a virtual visit. She states tested positive mentioned on discussion when I saw her husband.. Sounds like she wants new antiviral medication as well. We could get her scheduled with post covid clinic as well. See if she can get virtual with me or pcp.

## 2020-02-15 ENCOUNTER — Telehealth: Payer: Self-pay | Admitting: Medical

## 2020-02-15 NOTE — Telephone Encounter (Signed)
Called covid clinic and provider answered stated she will have someone call our office back when front desk staff gets in to get pt scheduled

## 2020-02-15 NOTE — Telephone Encounter (Signed)
Patient's spouse is calling in reference to medication for covid, patient states he and wife would like to try covid medications (pills) or  Remdesivir,Monoclonal Antibodies  Please Advise

## 2020-02-15 NOTE — Telephone Encounter (Signed)
Please refer to other telephone note

## 2020-02-15 NOTE — Telephone Encounter (Signed)
Opened to review 

## 2020-02-15 NOTE — Telephone Encounter (Signed)
Wife denied VV because she stated she has covid the same thing as her husband .  I did give wife the covid clinic number since the clinic hasnt called me back yet.    Wife still wants to try medications listed in the prior phone note .

## 2020-02-15 NOTE — Telephone Encounter (Signed)
Let patient know the new antiviral medicine is not available at our  pharmacy.  In addition I got email explaining that we should not write the medication but defer that decision to [facility] clinic/ambulatory treatment center.  I did go ahead and put an official referral through epic.  I also tried to call the Covid center but nobody ever picks up.  Patient's daughter is a Marine scientist that works for [employer].  You  can give patient and daughter the number to the center.  Patient's daughter can call them directly.   Patient's wife also reports to have Covid.  Dr. Charlett Blake says that she could be referred to the covid center as well.  She does not need to have appointment with [place] to be seen by covid center.  But ask Dr. Frederik Pear current MA to call [name] for information regarding recent illness.  Putting in the referral to ambulatory treatment center is a bit tedious and she needs to know number of days patient has been ill, when tested positive and list patient's current conditions vaccine status etc.

## 2020-02-15 NOTE — Telephone Encounter (Signed)
Patient has an appointment with covid clinic 02/16/20

## 2020-02-15 NOTE — Addendum Note (Signed)
Addended by: Anabel Halon on: 02/15/2020 02:31 PM   Modules accepted: Orders

## 2020-02-16 ENCOUNTER — Other Ambulatory Visit: Payer: Self-pay | Admitting: Physician Assistant

## 2020-02-16 ENCOUNTER — Ambulatory Visit: Payer: PPO

## 2020-02-16 ENCOUNTER — Ambulatory Visit (HOSPITAL_COMMUNITY)
Admission: RE | Admit: 2020-02-16 | Discharge: 2020-02-16 | Disposition: A | Discharge status: Home / Self Care | Payer: PPO | Source: Ambulatory Visit | Attending: Pulmonary Disease | Admitting: Pulmonary Disease

## 2020-02-16 DIAGNOSIS — I5032 Chronic diastolic (congestive) heart failure: Secondary | ICD-10-CM

## 2020-02-16 DIAGNOSIS — I10 Essential (primary) hypertension: Secondary | ICD-10-CM

## 2020-02-16 DIAGNOSIS — J45909 Unspecified asthma, uncomplicated: Secondary | ICD-10-CM

## 2020-02-16 DIAGNOSIS — U071 COVID-19: Secondary | ICD-10-CM

## 2020-02-16 DIAGNOSIS — Z8546 Personal history of malignant neoplasm of prostate: Secondary | ICD-10-CM

## 2020-02-16 DIAGNOSIS — E1165 Type 2 diabetes mellitus with hyperglycemia: Secondary | ICD-10-CM | POA: Diagnosis not present

## 2020-02-16 DIAGNOSIS — I6521 Occlusion and stenosis of right carotid artery: Secondary | ICD-10-CM

## 2020-02-16 DIAGNOSIS — I110 Hypertensive heart disease with heart failure: Secondary | ICD-10-CM | POA: Insufficient documentation

## 2020-02-16 MED ORDER — SOTROVIMAB 500 MG/8ML IV SOLN
500.0000 mg | Freq: Once | INTRAVENOUS | Status: AC
  Administered 2020-02-16: 500 mg via INTRAVENOUS

## 2020-02-16 MED ORDER — SODIUM CHLORIDE 0.9 % IV SOLN: INTRAVENOUS | Status: DC | PRN

## 2020-02-16 MED ORDER — FAMOTIDINE IN NACL 20-0.9 MG/50ML-% IV SOLN: 20.0000 mg | Freq: Once | INTRAVENOUS | Status: DC | PRN

## 2020-02-16 MED ORDER — METHYLPREDNISOLONE SODIUM SUCC 125 MG IJ SOLR: 125.0000 mg | Freq: Once | INTRAMUSCULAR | Status: DC | PRN

## 2020-02-16 MED ORDER — ALBUTEROL SULFATE HFA 108 (90 BASE) MCG/ACT IN AERS: 2.0000 | INHALATION_SPRAY | Freq: Once | RESPIRATORY_TRACT | Status: DC | PRN

## 2020-02-16 MED ORDER — EPINEPHRINE 0.3 MG/0.3ML IJ SOAJ: 0.3000 mg | Freq: Once | INTRAMUSCULAR | Status: DC | PRN

## 2020-02-16 MED ORDER — DIPHENHYDRAMINE HCL 50 MG/ML IJ SOLN: 50.0000 mg | Freq: Once | INTRAMUSCULAR | Status: DC | PRN

## 2020-02-16 NOTE — Progress Notes (Signed)
Diagnosis: COVID-19  Physician: Dr. Patrick Wright  Procedure: Covid Infusion Clinic Med: Sotrovimab infusion - Provided patient with sotrovimab fact sheet for patients, parents, and caregivers prior to infusion.   Complications: No immediate complications noted  Discharge: Discharged home    

## 2020-02-16 NOTE — Progress Notes (Signed)
Patient reviewed Fact Sheet for Patients, Parents, and Caregivers for Emergency Use Authorization (EUA) of sotrovimab for the Treatment of Coronavirus. Patient also reviewed and is agreeable to the estimated cost of treatment. Patient is agreeable to proceed.   

## 2020-02-16 NOTE — Discharge Instructions (Signed)

## 2020-02-16 NOTE — Progress Notes (Signed)
I connected by phone with [name] on 02/16/2020 at 10:57 AM to discuss the potential use of a new treatment for mild to moderate COVID-19 viral infection in non-hospitalized patients.  This patient is a 87 y.o. male that meets the FDA criteria for Emergency Use Authorization of COVID monoclonal antibody sotrovimab.  Has a (+) direct SARS-CoV-2 viral test result  Has mild or moderate COVID-19   Is NOT hospitalized due to COVID-19  Is within 10 days of symptom onset  Has at least one of the high risk factor(s) for progression to severe COVID-19 and/or hospitalization as defined in EUA.  Specific high risk criteria : Older age (>/= 85 yo), BMI > 25, Chronic Kidney Disease (CKD), Diabetes, Cardiovascular disease or hypertension and Chronic Lung Disease   I have spoken and communicated the following to the patient or parent/caregiver regarding COVID monoclonal antibody treatment:  1. FDA has authorized the emergency use for the treatment of mild to moderate COVID-19 in adults and pediatric patients with positive results of direct SARS-CoV-2 viral testing who are 69 years of age and older weighing at least 40 kg, and who are at high risk for progressing to severe COVID-19 and/or hospitalization.  2. The significant known and potential risks and benefits of COVID monoclonal antibody, and the extent to which such potential risks and benefits are unknown.  3. Information on available alternative treatments and the risks and benefits of those alternatives, including clinical trials.  4. Patients treated with COVID monoclonal antibody should continue to self-isolate and use infection control measures (e.g., wear mask, isolate, social distance, avoid sharing personal items, clean and disinfect "high touch" surfaces, and frequent handwashing) according to CDC guidelines.   5. The patient or parent/caregiver has the option to accept or refuse COVID monoclonal antibody treatment.  After reviewing  this information with the patient, the patient has agreed to receive one of the available covid 19 monoclonal antibodies and will be provided an appropriate fact sheet prior to infusion.    Angelena Form, PA-C 02/16/2020 10:57 AM

## 2020-02-20 DIAGNOSIS — Z7189 Other specified counseling: Secondary | ICD-10-CM | POA: Diagnosis not present

## 2020-02-20 DIAGNOSIS — Z20828 Contact with and (suspected) exposure to other viral communicable diseases: Secondary | ICD-10-CM | POA: Diagnosis not present

## 2020-03-05 ENCOUNTER — Ambulatory Visit (HOSPITAL_BASED_OUTPATIENT_CLINIC_OR_DEPARTMENT_OTHER)
Admission: RE | Admit: 2020-03-05 | Discharge: 2020-03-05 | Disposition: A | Discharge status: Home / Self Care | Payer: PPO | Source: Ambulatory Visit | Attending: Medical | Admitting: Medical

## 2020-03-05 ENCOUNTER — Other Ambulatory Visit: Payer: Self-pay | Admitting: Family Medicine

## 2020-03-05 ENCOUNTER — Ambulatory Visit (INDEPENDENT_AMBULATORY_CARE_PROVIDER_SITE_OTHER): Payer: PPO | Admitting: Medical

## 2020-03-05 ENCOUNTER — Other Ambulatory Visit: Payer: Self-pay | Admitting: Medical

## 2020-03-05 ENCOUNTER — Other Ambulatory Visit: Payer: Self-pay

## 2020-03-05 VITALS — BP 123/60 | HR 55 | Resp 18 | Ht 67.0 in | Wt 149.6 lb

## 2020-03-05 DIAGNOSIS — K5900 Constipation, unspecified: Secondary | ICD-10-CM | POA: Diagnosis not present

## 2020-03-05 DIAGNOSIS — R1013 Epigastric pain: Secondary | ICD-10-CM | POA: Diagnosis not present

## 2020-03-05 DIAGNOSIS — R109 Unspecified abdominal pain: Secondary | ICD-10-CM | POA: Insufficient documentation

## 2020-03-05 MED ORDER — SUCRALFATE 1 G PO TABS: 1.0000 g | ORAL_TABLET | Freq: Three times a day (TID) | ORAL | 0 refills | Status: DC

## 2020-03-05 MED FILL — SUCRALFATE 1 GM TAB: 1 | 22 days supply | Qty: 90 | Fill #0

## 2020-03-05 MED FILL — MINOXIDIL 10 MG TABLET: 10 | 90 days supply | Qty: 180 | Fill #2

## 2020-03-05 NOTE — Patient Instructions (Addendum)
You have recent epigastric pain flare and report that addition of famotidine 2 tablets twice daily has helped.  This is on top of your current Protonix use.  You have appointment with GI PA scheduled for next month.  Do recommend keeping that appointment.  If both Protonix and famotidine do not control your pain then would recommend adding on sucralfate.  If this is needed let me know how you respond to this.  If pain persists or worsens despite use of 3 meds then would go ahead and order CT abdomen pelvis pending your appointment with GI office.  Also would put in request that they see you sooner if possible.  I think you will likely benefit from egd. GI office will decide if indicated.  We will go ahead and get 1 view abdomen x-ray today since you do report history of intermittent constipation in context of abdomen pain.  Also will put future labs to get done tomorrow at [facility] location.  Those labs will be CBC, CMP and lipase.  Recommend keeping well-hydrated and eating healthy.  Follow-up in 7 days or as needed.

## 2020-03-05 NOTE — Telephone Encounter (Signed)
Patient has an appointment today with Percell Miller

## 2020-03-05 NOTE — Progress Notes (Signed)
Subjective:    Patient ID: [name], male    DOB: June 13, 1929, 87 y.o.   MRN: [id number]  HPI  Pt in today with some lower abdomen area pain. States for 3 weeks had some intermittent discomfort after eating. But not every time. Pt has some nausea.  These symptoms did start during covid. He tested + around 02-14-2020. Pt tells me never had any severe cough or wheezing. He did get treated with monoclonal antibodies.  Recent decrease in appetite.  Pt wife states he does not drink much water and not eating much. About one week ago was constipated. But last smaller sized stools past 3 days.   Pt has used famotadine/pepcid. 2 tab twice daily and he is also on protonix.  Last egd done in 2011. Pt called Dr. Carlean Purl office. He has appointment with GI PA-C.   Review of Systems  Constitutional: Negative for chills, fatigue and fever.  Respiratory: Negative for cough, chest tightness, shortness of breath and wheezing.   Cardiovascular: Negative for chest pain and palpitations.  Gastrointestinal: Positive for abdominal pain and constipation. Negative for abdominal distention, anal bleeding, blood in stool, diarrhea, nausea, rectal pain and vomiting.       Intermittent constipation.  Genitourinary: Negative for dysuria.  Musculoskeletal: Negative for back pain and neck pain.  Skin: Negative for rash.  Neurological: Negative for dizziness, speech difficulty, weakness, numbness and headaches.  Hematological: Negative for adenopathy. Does not bruise/bleed easily.  Psychiatric/Behavioral: Negative for behavioral problems and confusion.   Past Medical History:  Diagnosis Date  . Acute bronchitis 06/10/2015  . Allergic rhinitis   . Anemia   . Asthma   . Carotid stenosis    a. s/p Left CEA 2002;  b. Carotid US 3/16:  patent R CEA, L < 40%  . Chronic pain syndrome    Left shoulder, back  . Diverticulosis   . Dyslipidemia   . Epigastric pain 04/05/2016  . Esophageal stricture    Distal,  benign  . GERD (gastroesophageal reflux disease)   . Hemorrhoids   . HTN (hypertension)    Negative renal duplex 07-22-11  . Hx of echocardiogram    a. Echo 10/11: mod LVH, EF 55-60%  . Prostate cancer ([place]) 2000   Seed XRT  . Spondylosis, cervical, with myelopathy 11/26/2015  . 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 Marital status: Married    Spouse name: Not on file  . Number of children: 1  . Years of education: 67  . Highest education level: Not on file  Occupational History  . Occupation: Retired  Tobacco Use  . Smoking status: Former Smoker    Packs/day: 0.25    Years: 35.00    Pack years: 8.75    Types: Cigarettes    Quit date: 01/12/1978    Years since quitting: 42.1  . Smokeless tobacco: Current User    Types: Chew  Substance and Sexual Activity  . Alcohol use: No    Comment: Prior alcoholic, sober since 1191  . Drug use: No  . Sexual activity: Not Currently  Other Topics Concern  . Not on file  Social History Narrative   Retired lives at home w/ his wife   1 daughter   Right-handed    Caffeine: drinks a lot of coffee    Former smoker, former alcoholic abstinent for many years   Social Determinants of Health  Financial Resource Strain: Not on file  Food Insecurity: Not on file  Transportation Needs: Not on file  Physical Activity: Not on file  Stress: Not on file  Social Connections: Not on file  Intimate Partner Violence: Not on file    Past Surgical History:  Procedure Laterality Date  . APPENDECTOMY  1953  . CAROTID ENDARTERECTOMY Right 01/2000  . CATARACT EXTRACTION Bilateral   . CERVICAL DISC SURGERY  8/07  . HIP ARTHROPLASTY Left 10/24/2018   Procedure: ARTHROPLASTY BIPOLAR HIP (HEMIARTHROPLASTY);  Surgeon: Paralee Cancel, MD;  Location: [place];  Service: Orthopedics;  Laterality: Left;  . INSERTION PROSTATE RADIATION SEED  2000  . [place] SURGERY  1990s  . NASAL SINUS  SURGERY     x 3    Family History  Problem Relation Age of Onset  . Stroke Brother   . Stroke Sister   . Stroke Mother   . Hyperlipidemia Mother   . Hypertension Mother   . Lung disease Father   . Diabetes Son   . Gout Son   . Obesity Sister   . Alcohol abuse Brother   . Cancer Brother        lung, smoker    Allergies  Allergen Reactions  . Hctz [Hydrochlorothiazide] Shortness Of Breath, Swelling and Other (See Comments)    "Swelling and dyspnea"   . Hydralazine Shortness Of Breath and Other (See Comments)    Chest pain and GI issues also  . Diovan [Valsartan] Other (See Comments)    Elevated potassium- Hyperkalemia  . Metformin And Related Other (See Comments)    Reaction not recalled  . Nsaids Other (See Comments)    Other than Tylenol, he isn't to have these because he's on Eliquis  . Other Other (See Comments)    Unnamed topically-applied B/P patch = Caused redness  . Prednisone Other (See Comments)    Suicidal thoughts  . Spironolactone Swelling    Site of swelling not recalled  . Codeine Itching and Nausea Only  . Oxycodone-Acetaminophen Itching and Nausea Only    Current Outpatient Medications on File Prior to Visit  Medication Sig Dispense Refill  . acetaminophen (TYLENOL) 500 MG tablet Take 500 mg by mouth every 6 (six) hours as needed for mild pain or headache.    . albuterol (PROVENTIL HFA;VENTOLIN HFA) 108 (90 Base) MCG/ACT inhaler Inhale 1-2 puffs into the lungs every 6 (six) hours as needed for wheezing or shortness of breath. 1 Inhaler 6  . albuterol (VENTOLIN HFA) 108 (90 Base) MCG/ACT inhaler Inhale 2 puffs into the lungs every 6 (six) hours as needed. 18 g 0  . amLODipine (NORVASC) 5 MG tablet Take 2 tablets (10 mg total) by mouth daily. 180 tablet 2  . apixaban (ELIQUIS) 5 MG TABS tablet Take 1 tablet (5 mg total) by mouth 2 (two) times daily. 180 tablet 2  . atorvastatin (LIPITOR) 40 MG tablet Take 1 tablet (40 mg total) by mouth daily. 90 tablet  1  . cloNIDine (CATAPRES) 0.1 MG tablet TAKE 1 TABLET BY MOUTH 2 TIMES DAILY 180 tablet 3  . famotidine (PEPCID) 40 MG tablet Take 1 tablet (40 mg total) by mouth daily. (Patient taking differently: Take 40 mg by mouth as needed for heartburn or indigestion.) 30 tablet 2  . fluticasone (FLOVENT HFA) 110 MCG/ACT inhaler Inhale 2 puffs into the lungs 2 (two) times daily. 1 Inhaler 0  . gabapentin (NEURONTIN) 300 MG capsule TAKE 1 CAPSULE BY MOUTH 3 TIMES DAILY  270 capsule 1  . lisinopril (ZESTRIL) 2.5 MG tablet Take 1 tablet (2.5 mg total) by mouth daily. 90 tablet 1  . Menthol-Methyl Salicylate (MUSCLE RUB) 10-15 % CREA Apply 1 application topically 3 (three) times daily with meals. To BLE 85 g 0  . metoCLOPramide (REGLAN) 5 MG tablet Take 1 tablet (5 mg total) by mouth at bedtime. 30 tablet 5  . metoprolol succinate (TOPROL-XL) 25 MG 24 hr tablet TAKE 1/2 TABLET BY MOUTH DAILY 45 tablet 3  . minoxidil (LONITEN) 10 MG tablet TAKE 1 TABLET BY MOUTH TWICE DAILY 180 tablet 3  . ondansetron (ZOFRAN ODT) 8 MG disintegrating tablet Take 1 tablet (8 mg total) by mouth every 8 (eight) hours as needed for nausea or vomiting. 20 tablet 0  . pantoprazole (PROTONIX) 40 MG tablet TAKE 1 TABLET BY MOUTH ONCE A DAY BEFORE BREAKFAST (NEEDS OFFICE VISIT) 90 tablet 3  . polyethylene glycol (MIRALAX / GLYCOLAX) 17 g packet Take 17 g by mouth 2 (two) times daily. 28 packet 0  . polyvinyl alcohol (LIQUIFILM TEARS) 1.4 % ophthalmic solution Place 1 drop into both eyes as needed for dry eyes.     . traMADol (ULTRAM) 50 MG tablet TAKE 1 TABLET BY MOUTH 3 TIMES DAILY AS NEEDED FOR MODERATE PAIN 90 tablet 0  . triamcinolone cream (KENALOG) 0.5 % Apply 1 application topically daily as needed (for skin irritation).     . Vitamin D, Ergocalciferol, (DRISDOL) 1.25 MG (50000 UNIT) CAPS capsule Take 1 capsule (50,000 Units total) by mouth every 7 (seven) days. 12 capsule 0  . azithromycin (ZITHROMAX) 250 MG tablet Take 2 tablets by  mouth on day 1, followed by 1 tablet by mouth daily for 4 days. (Patient not taking: Reported on 03/05/2020) 6 tablet 0  . ferrous sulfate (FERROUSUL) 325 (65 FE) MG tablet Take 1 tablet (325 mg total) by mouth 3 (three) times daily with meals for 14 days. 90 tablet 2   No current facility-administered medications on file prior to visit.    BP 123/60   Pulse (!) 55   Resp 18   Ht 5\' 7"  (1.702 m)   Wt 149 lb 9.6 oz (67.9 kg)   SpO2 95%   BMI 23.43 kg/m    Past Medical History:  Diagnosis Date  . Acute bronchitis 06/10/2015  . Allergic rhinitis   . Anemia   . Asthma   . Carotid stenosis    a. s/p Left CEA 2002;  b. Carotid US 3/16:  patent R CEA, L < 40%  . Chronic pain syndrome    Left shoulder, back  . Diverticulosis   . Dyslipidemia   . Epigastric pain 04/05/2016  . Esophageal stricture    Distal, benign  . GERD (gastroesophageal reflux disease)   . Hemorrhoids   . HTN (hypertension)    Negative renal duplex 07-22-11  . Hx of echocardiogram    a. Echo 10/11: mod LVH, EF 55-60%  . Prostate cancer ([place]) 2000   Seed XRT  . Spondylosis, cervical, with myelopathy 11/26/2015  . Stroke [facility]) 8/01   right thalamic - on chronic Plavix/ASA     Social History   Socioeconomic History  . Marital status: Married    Spouse name: Not on file  . Number of children: 1  . Years of education: 49  . Highest education level: Not on file  Occupational History  . Occupation: Retired  Tobacco Use  . Smoking status: Former Smoker    Packs/day:  0.25    Years: 35.00    Pack years: 8.75    Types: Cigarettes    Quit date: 01/12/1978    Years since quitting: 42.1  . Smokeless tobacco: Current User    Types: Chew  Substance and Sexual Activity  . Alcohol use: No    Comment: Prior alcoholic, sober since 2841  . Drug use: No  . Sexual activity: Not Currently  Other Topics Concern  . Not on file  Social History Narrative   Retired lives at home w/ his wife   1 daughter    Right-handed    Caffeine: drinks a lot of coffee    Former smoker, former alcoholic abstinent for many years   Social Determinants of Radio broadcast assistant Strain: Not on file  Food Insecurity: Not on file  Transportation Needs: Not on file  Physical Activity: Not on file  Stress: Not on file  Social Connections: Not on file  Intimate Partner Violence: Not on file    Past Surgical History:  Procedure Laterality Date  . APPENDECTOMY  1953  . CAROTID ENDARTERECTOMY Right 01/2000  . CATARACT EXTRACTION Bilateral   . CERVICAL DISC SURGERY  8/07  . HIP ARTHROPLASTY Left 10/24/2018   Procedure: ARTHROPLASTY BIPOLAR HIP (HEMIARTHROPLASTY);  Surgeon: Paralee Cancel, MD;  Location: [place];  Service: Orthopedics;  Laterality: Left;  . INSERTION PROSTATE RADIATION SEED  2000  . [place] SURGERY  1990s  . NASAL SINUS SURGERY     x 3    Family History  Problem Relation Age of Onset  . Stroke Brother   . Stroke Sister   . Stroke Mother   . Hyperlipidemia Mother   . Hypertension Mother   . Lung disease Father   . Diabetes Son   . Gout Son   . Obesity Sister   . Alcohol abuse Brother   . Cancer Brother        lung, smoker    Allergies  Allergen Reactions  . Hctz [Hydrochlorothiazide] Shortness Of Breath, Swelling and Other (See Comments)    "Swelling and dyspnea"   . Hydralazine Shortness Of Breath and Other (See Comments)    Chest pain and GI issues also  . Diovan [Valsartan] Other (See Comments)    Elevated potassium- Hyperkalemia  . Metformin And Related Other (See Comments)    Reaction not recalled  . Nsaids Other (See Comments)    Other than Tylenol, he isn't to have these because he's on Eliquis  . Other Other (See Comments)    Unnamed topically-applied B/P patch = Caused redness  . Prednisone Other (See Comments)    Suicidal thoughts  . Spironolactone Swelling    Site of swelling not recalled  . Codeine Itching and Nausea Only  . Oxycodone-Acetaminophen  Itching and Nausea Only    Current Outpatient Medications on File Prior to Visit  Medication Sig Dispense Refill  . acetaminophen (TYLENOL) 500 MG tablet Take 500 mg by mouth every 6 (six) hours as needed for mild pain or headache.    . albuterol (PROVENTIL HFA;VENTOLIN HFA) 108 (90 Base) MCG/ACT inhaler Inhale 1-2 puffs into the lungs every 6 (six) hours as needed for wheezing or shortness of breath. 1 Inhaler 6  . albuterol (VENTOLIN HFA) 108 (90 Base) MCG/ACT inhaler Inhale 2 puffs into the lungs every 6 (six) hours as needed. 18 g 0  . amLODipine (NORVASC) 5 MG tablet Take 2 tablets (10 mg total) by mouth daily. 180 tablet 2  .  apixaban (ELIQUIS) 5 MG TABS tablet Take 1 tablet (5 mg total) by mouth 2 (two) times daily. 180 tablet 2  . atorvastatin (LIPITOR) 40 MG tablet Take 1 tablet (40 mg total) by mouth daily. 90 tablet 1  . cloNIDine (CATAPRES) 0.1 MG tablet TAKE 1 TABLET BY MOUTH 2 TIMES DAILY 180 tablet 3  . famotidine (PEPCID) 40 MG tablet Take 1 tablet (40 mg total) by mouth daily. (Patient taking differently: Take 40 mg by mouth as needed for heartburn or indigestion.) 30 tablet 2  . fluticasone (FLOVENT HFA) 110 MCG/ACT inhaler Inhale 2 puffs into the lungs 2 (two) times daily. 1 Inhaler 0  . gabapentin (NEURONTIN) 300 MG capsule TAKE 1 CAPSULE BY MOUTH 3 TIMES DAILY 270 capsule 1  . lisinopril (ZESTRIL) 2.5 MG tablet Take 1 tablet (2.5 mg total) by mouth daily. 90 tablet 1  . Menthol-Methyl Salicylate (MUSCLE RUB) 10-15 % CREA Apply 1 application topically 3 (three) times daily with meals. To BLE 85 g 0  . metoCLOPramide (REGLAN) 5 MG tablet Take 1 tablet (5 mg total) by mouth at bedtime. 30 tablet 5  . metoprolol succinate (TOPROL-XL) 25 MG 24 hr tablet TAKE 1/2 TABLET BY MOUTH DAILY 45 tablet 3  . minoxidil (LONITEN) 10 MG tablet TAKE 1 TABLET BY MOUTH TWICE DAILY 180 tablet 3  . ondansetron (ZOFRAN ODT) 8 MG disintegrating tablet Take 1 tablet (8 mg total) by mouth every 8  (eight) hours as needed for nausea or vomiting. 20 tablet 0  . pantoprazole (PROTONIX) 40 MG tablet TAKE 1 TABLET BY MOUTH ONCE A DAY BEFORE BREAKFAST (NEEDS OFFICE VISIT) 90 tablet 3  . polyethylene glycol (MIRALAX / GLYCOLAX) 17 g packet Take 17 g by mouth 2 (two) times daily. 28 packet 0  . polyvinyl alcohol (LIQUIFILM TEARS) 1.4 % ophthalmic solution Place 1 drop into both eyes as needed for dry eyes.     . traMADol (ULTRAM) 50 MG tablet TAKE 1 TABLET BY MOUTH 3 TIMES DAILY AS NEEDED FOR MODERATE PAIN 90 tablet 0  . triamcinolone cream (KENALOG) 0.5 % Apply 1 application topically daily as needed (for skin irritation).     . Vitamin D, Ergocalciferol, (DRISDOL) 1.25 MG (50000 UNIT) CAPS capsule Take 1 capsule (50,000 Units total) by mouth every 7 (seven) days. 12 capsule 0  . azithromycin (ZITHROMAX) 250 MG tablet Take 2 tablets by mouth on day 1, followed by 1 tablet by mouth daily for 4 days. (Patient not taking: Reported on 03/05/2020) 6 tablet 0  . ferrous sulfate (FERROUSUL) 325 (65 FE) MG tablet Take 1 tablet (325 mg total) by mouth 3 (three) times daily with meals for 14 days. 90 tablet 2   No current facility-administered medications on file prior to visit.    BP 123/60   Pulse (!) 55   Resp 18   Ht 5\' 7"  (1.702 m)   Wt 149 lb 9.6 oz (67.9 kg)   SpO2 95%   BMI 23.43 kg/m       Objective:   Physical Exam  General- No acute distress. Pleasant patient. Neck- Full range of motion, no jvd Lungs- Clear, even and unlabored. Heart- regular rate and rhythm. Neurologic- CNII- XII grossly intact. Abdomen- mild pain around epigastric area and just above umbilicus      Assessment & Plan:  You have recent epigastric pain flare and report that addition of famotidine 2 tablets twice daily has helped.  This is on top of your current Protonix use.  You have appointment with GI PA scheduled for next month.  Do recommend keeping that appointment.  If both Protonix and famotidine do not  control your pain then would recommend adding on sucralfate.  If this is needed let me know how you respond to this.  If pain persists or worsens despite use of 3 meds then would go ahead and order CT abdomen pelvis pending your appointment with GI office.  Also would put in request that they see you sooner if possible.  We will go ahead and get 1 view abdomen x-ray today since you do report history of intermittent constipation in context of abdomen pain.  Also will put future labs to get done tomorrow at [facility] location.  Those labs will be CBC, CMP and lipase.  Recommend keeping well-hydrated and eating healthy.  Follow-up in 7 days or as needed.

## 2020-03-05 NOTE — Addendum Note (Signed)
Addended by: Anabel Halon on: 03/05/2020 05:18 PM   Modules accepted: Orders

## 2020-03-06 ENCOUNTER — Other Ambulatory Visit: Payer: Self-pay | Admitting: Family Medicine

## 2020-03-06 ENCOUNTER — Other Ambulatory Visit (INDEPENDENT_AMBULATORY_CARE_PROVIDER_SITE_OTHER): Payer: PPO

## 2020-03-06 DIAGNOSIS — R1013 Epigastric pain: Secondary | ICD-10-CM

## 2020-03-06 LAB — COMPREHENSIVE METABOLIC PANEL
ALT: 4 U/L (ref 0–53)
AST: 11 U/L (ref 0–37)
Albumin: 3.6 g/dL (ref 3.5–5.2)
Alkaline Phosphatase: 45 U/L (ref 39–117)
BUN: 25 mg/dL — ABNORMAL HIGH (ref 6–23)
CO2: 29 mEq/L (ref 19–32)
Calcium: 9 mg/dL (ref 8.4–10.5)
Chloride: 106 mEq/L (ref 96–112)
Creatinine, Ser: 1.3 mg/dL (ref 0.40–1.50)
GFR: 49.04 mL/min — ABNORMAL LOW (ref >60.00)
Glucose, Bld: 120 mg/dL — ABNORMAL HIGH (ref 70–99)
Potassium: 4.3 mEq/L (ref 3.5–5.1)
Sodium: 139 mEq/L (ref 135–145)
Total Bilirubin: 0.5 mg/dL (ref 0.2–1.2)
Total Protein: 6.3 g/dL (ref 6.0–8.3)

## 2020-03-06 LAB — CBC WITH DIFFERENTIAL/PLATELET
Basophils Absolute: 0.1 10*3/uL (ref 0.0–0.1)
Basophils Relative: 1.2 % (ref 0.0–3.0)
Eosinophils Absolute: 0.1 10*3/uL (ref 0.0–0.7)
Eosinophils Relative: 2.6 % (ref 0.0–5.0)
HCT: 33.6 % — ABNORMAL LOW (ref 39.0–52.0)
Hemoglobin: 11.8 g/dL — ABNORMAL LOW (ref 13.0–17.0)
Lymphocytes Relative: 30.2 % (ref 12.0–46.0)
Lymphs Abs: 1.3 10*3/uL (ref 0.7–4.0)
MCHC: 35.3 g/dL (ref 30.0–36.0)
MCV: 88.8 fl (ref 78.0–100.0)
Monocytes Absolute: 0.4 10*3/uL (ref 0.1–1.0)
Monocytes Relative: 10.1 % (ref 3.0–12.0)
Neutro Abs: 2.5 10*3/uL (ref 1.4–7.7)
Neutrophils Relative %: 55.9 % (ref 43.0–77.0)
Platelets: 261 10*3/uL (ref 150.0–400.0)
RBC: 3.78 Mil/uL — ABNORMAL LOW (ref 4.22–5.81)
RDW: 14.8 % (ref 11.5–15.5)
WBC: 4.4 10*3/uL (ref 4.0–10.5)

## 2020-03-06 LAB — LIPASE: Lipase: 13 U/L (ref 11.0–59.0)

## 2020-03-06 MED FILL — traMADol HCL 50 MG TABS: 50 | 30 days supply | Qty: 90 | Fill #0

## 2020-03-06 NOTE — Telephone Encounter (Signed)
Requesting: Tramadol 50mg  Contract: 05/29/2019 UDS: 05/29/2019 Last Visit: 03/05/2020 w/ Percell Miller Next Visit: None Last Refill: 02/06/2020 #90 and 0RF Pt sig: 1 tab tid prn  Please Advise

## 2020-03-08 MED FILL — METOCLOPRAMIDE 5 MG TABLET: 5 | 30 days supply | Qty: 30 | Fill #3

## 2020-03-08 MED FILL — ELIQUIS 5 MG TABLET: 5 | 30 days supply | Qty: 60 | Fill #8

## 2020-03-08 MED FILL — METOPROLOL SUCCINATE ER 25: 25 | 90 days supply | Qty: 45 | Fill #1

## 2020-03-12 ENCOUNTER — Telehealth: Payer: Self-pay

## 2020-03-12 ENCOUNTER — Other Ambulatory Visit: Payer: Self-pay | Admitting: Internal Medicine

## 2020-03-12 MED ORDER — PANTOPRAZOLE SODIUM 40 MG PO TBEC: DELAYED_RELEASE_TABLET | ORAL | 3 refills | Status: DC

## 2020-03-12 NOTE — Telephone Encounter (Signed)
Pantoprazole refilled, he is up to date on his visits.

## 2020-03-13 ENCOUNTER — Ambulatory Visit: Payer: PPO | Admitting: Gastroenterology

## 2020-03-16 ENCOUNTER — Other Ambulatory Visit: Payer: Self-pay | Admitting: Internal Medicine

## 2020-03-16 MED FILL — CloNIDine HCL 0.1 MG TAB: 0.1 | 90 days supply | Qty: 180 | Fill #1

## 2020-03-18 ENCOUNTER — Other Ambulatory Visit: Payer: Self-pay | Admitting: Internal Medicine

## 2020-03-18 ENCOUNTER — Other Ambulatory Visit: Payer: Self-pay | Admitting: *Deleted

## 2020-03-18 ENCOUNTER — Other Ambulatory Visit: Payer: Self-pay

## 2020-03-18 MED ORDER — AMLODIPINE BESYLATE 5 MG PO TABS: 10.0000 mg | ORAL_TABLET | Freq: Every day | ORAL | 2 refills | Status: DC

## 2020-03-18 MED ORDER — PANTOPRAZOLE SODIUM 40 MG PO TBEC: DELAYED_RELEASE_TABLET | ORAL | 3 refills | Status: DC

## 2020-03-18 MED FILL — AMLODIPINE BESYLATE 5 MG TA: 5 | 90 days supply | Qty: 180 | Fill #0

## 2020-03-18 NOTE — Telephone Encounter (Signed)
Pantoprazole refilled as he is up to date on his appointments.

## 2020-03-22 MED FILL — GABAPENTIN 300 MG CAPSULE: 300 | 90 days supply | Qty: 270 | Fill #1

## 2020-04-02 ENCOUNTER — Other Ambulatory Visit: Payer: Self-pay | Admitting: Internal Medicine

## 2020-04-02 ENCOUNTER — Other Ambulatory Visit: Payer: Self-pay | Admitting: Family Medicine

## 2020-04-02 MED FILL — traMADol HCL 50 MG TABS: 50 | 30 days supply | Qty: 90 | Fill #0

## 2020-04-02 MED FILL — ELIQUIS 5 MG TABLET: 5 | 30 days supply | Qty: 60 | Fill #0

## 2020-04-02 NOTE — Telephone Encounter (Signed)
I refilled but he needs an appt by August

## 2020-04-02 NOTE — Telephone Encounter (Signed)
Requesting: tramadol Contract: 05/29/19 UDS: 05/29/19 Last Visit: 03/05/20 with Percell Miller Next Visit: none Last Refill: 03/06/20  Please Advise

## 2020-04-02 NOTE — Telephone Encounter (Signed)
Prescription refill request for Eliquis received.  Indication: PAF Last office visit: 11/23/2019 Scr: 1.30, 03/06/2020 Age: 87 yo  Weight: 67.9 kg   Pt is on the correct dose of Eliquis per dosing criteria, prescription refill sent for Eliquis 5mg  BID.

## 2020-04-08 MED FILL — METOCLOPRAMIDE 5 MG TABLET: 5 | 30 days supply | Qty: 30 | Fill #4

## 2020-04-13 ENCOUNTER — Other Ambulatory Visit: Payer: Self-pay | Admitting: Family Medicine

## 2020-04-13 DIAGNOSIS — E559 Vitamin D deficiency, unspecified: Secondary | ICD-10-CM

## 2020-04-16 ENCOUNTER — Other Ambulatory Visit (HOSPITAL_COMMUNITY): Payer: Self-pay

## 2020-04-20 ENCOUNTER — Other Ambulatory Visit (HOSPITAL_COMMUNITY): Payer: Self-pay

## 2020-04-20 MED FILL — Pantoprazole Sodium EC Tab 40 MG (Base Equiv): ORAL | 90 days supply | Qty: 90 | Fill #0 | Status: AC

## 2020-04-22 ENCOUNTER — Other Ambulatory Visit (HOSPITAL_COMMUNITY): Payer: Self-pay

## 2020-04-24 ENCOUNTER — Other Ambulatory Visit (HOSPITAL_COMMUNITY): Payer: Self-pay

## 2020-04-25 ENCOUNTER — Other Ambulatory Visit (HOSPITAL_COMMUNITY): Payer: Self-pay

## 2020-04-27 ENCOUNTER — Other Ambulatory Visit (HOSPITAL_COMMUNITY): Payer: Self-pay

## 2020-04-30 ENCOUNTER — Other Ambulatory Visit: Payer: Self-pay | Admitting: Family Medicine

## 2020-05-01 ENCOUNTER — Other Ambulatory Visit (HOSPITAL_COMMUNITY): Payer: Self-pay

## 2020-05-01 MED ORDER — TRAMADOL HCL 50 MG PO TABS
ORAL_TABLET | ORAL | 0 refills | Status: DC
  Filled 2020-05-01: qty 90, 30d supply, fill #0

## 2020-05-02 ENCOUNTER — Other Ambulatory Visit (HOSPITAL_COMMUNITY): Payer: Self-pay

## 2020-05-08 ENCOUNTER — Other Ambulatory Visit (HOSPITAL_COMMUNITY): Payer: Self-pay

## 2020-05-08 MED FILL — Apixaban Tab 5 MG: ORAL | 30 days supply | Qty: 60 | Fill #0 | Status: AC

## 2020-05-08 MED FILL — Lisinopril Tab 2.5 MG: ORAL | 90 days supply | Qty: 90 | Fill #0 | Status: AC

## 2020-05-09 MED FILL — Metoclopramide HCl Tab 5 MG (Base Equivalent): ORAL | 30 days supply | Qty: 30 | Fill #0 | Status: AC

## 2020-05-09 MED FILL — Atorvastatin Calcium Tab 40 MG (Base Equivalent): ORAL | 90 days supply | Qty: 90 | Fill #0 | Status: AC

## 2020-05-10 ENCOUNTER — Other Ambulatory Visit (HOSPITAL_COMMUNITY): Payer: Self-pay

## 2020-05-14 ENCOUNTER — Other Ambulatory Visit (HOSPITAL_COMMUNITY): Payer: Self-pay

## 2020-05-27 ENCOUNTER — Other Ambulatory Visit (HOSPITAL_COMMUNITY): Payer: Self-pay

## 2020-05-29 ENCOUNTER — Other Ambulatory Visit (HOSPITAL_COMMUNITY): Payer: Self-pay

## 2020-05-29 ENCOUNTER — Other Ambulatory Visit: Payer: Self-pay | Admitting: Family Medicine

## 2020-05-30 ENCOUNTER — Other Ambulatory Visit (HOSPITAL_COMMUNITY): Payer: Self-pay

## 2020-05-30 MED ORDER — TRAMADOL HCL 50 MG PO TABS
ORAL_TABLET | ORAL | 0 refills | Status: DC
  Filled 2020-05-30: qty 90, 30d supply, fill #0

## 2020-05-30 NOTE — Telephone Encounter (Signed)
Requesting: tramadol 50mg  Contract: 05/29/2019 UDS: 01/13/2018 Last Visit: 02/20/2020 w/ Percell Miller Next Visit: None Last Refill: 05/01/2020 #90 and 0RF  Please Advise

## 2020-05-31 ENCOUNTER — Other Ambulatory Visit (HOSPITAL_COMMUNITY): Payer: Self-pay

## 2020-06-04 ENCOUNTER — Other Ambulatory Visit (HOSPITAL_COMMUNITY): Payer: Self-pay

## 2020-06-04 MED FILL — Minoxidil Tab 10 MG: ORAL | 90 days supply | Qty: 180 | Fill #0 | Status: AC

## 2020-06-04 MED FILL — Apixaban Tab 5 MG: ORAL | 30 days supply | Qty: 60 | Fill #1 | Status: AC

## 2020-06-05 ENCOUNTER — Other Ambulatory Visit (HOSPITAL_COMMUNITY): Payer: Self-pay

## 2020-06-07 DIAGNOSIS — Z20828 Contact with and (suspected) exposure to other viral communicable diseases: Secondary | ICD-10-CM | POA: Diagnosis not present

## 2020-06-07 DIAGNOSIS — J309 Allergic rhinitis, unspecified: Secondary | ICD-10-CM | POA: Diagnosis not present

## 2020-06-08 ENCOUNTER — Other Ambulatory Visit (HOSPITAL_COMMUNITY): Payer: Self-pay

## 2020-06-08 MED ORDER — AZITHROMYCIN 250 MG PO TABS
ORAL_TABLET | ORAL | 0 refills | Status: DC
  Filled 2020-06-08: qty 6, 5d supply, fill #0

## 2020-06-08 MED ORDER — PREDNISONE 20 MG PO TABS
ORAL_TABLET | ORAL | 0 refills | Status: DC
  Filled 2020-06-08: qty 10, 5d supply, fill #0

## 2020-06-11 ENCOUNTER — Other Ambulatory Visit: Payer: Self-pay | Admitting: Internal Medicine

## 2020-06-11 ENCOUNTER — Other Ambulatory Visit (HOSPITAL_COMMUNITY): Payer: Self-pay

## 2020-06-11 MED ORDER — METOCLOPRAMIDE HCL 5 MG PO TABS
ORAL_TABLET | Freq: Every day | ORAL | 5 refills | Status: DC
  Filled 2020-06-11: qty 30, 30d supply, fill #0
  Filled 2020-07-05: qty 30, 30d supply, fill #1
  Filled 2020-08-16: qty 30, 30d supply, fill #2
  Filled 2020-09-17: qty 30, 30d supply, fill #3
  Filled 2020-10-15: qty 30, 30d supply, fill #4
  Filled 2020-11-08: qty 30, 30d supply, fill #5

## 2020-06-11 MED FILL — Metoprolol Succinate Tab ER 24HR 25 MG (Tartrate Equiv): ORAL | 90 days supply | Qty: 45 | Fill #0 | Status: AC

## 2020-06-12 ENCOUNTER — Other Ambulatory Visit: Payer: Self-pay | Admitting: *Deleted

## 2020-06-12 DIAGNOSIS — R42 Dizziness and giddiness: Secondary | ICD-10-CM

## 2020-06-12 NOTE — Progress Notes (Signed)
From: Armando Gang Sent: 06/11/2020   4:59 PM EDT To: Rodman Key, RN Subject: Need new referral                              Per [facility]- Referral greater than 6 months, new referral required per Ancil Linsey /referral will be closed.   See notes:           [name] Kitchen Type Date User Summary Attachment  General 05/20/2020 2:18 PM Janett Labella - -  Note   Referral greater than 6 months, new referral required         _________________________________________________________________________ _________________________________________________________________________   New referral placed for [facility] per request above.

## 2020-06-14 ENCOUNTER — Other Ambulatory Visit (HOSPITAL_COMMUNITY): Payer: Self-pay

## 2020-06-14 MED FILL — Amlodipine Besylate Tab 5 MG (Base Equivalent): ORAL | 90 days supply | Qty: 180 | Fill #0 | Status: AC

## 2020-06-14 MED FILL — Clonidine HCl Tab 0.1 MG: ORAL | 90 days supply | Qty: 180 | Fill #0 | Status: AC

## 2020-06-18 ENCOUNTER — Other Ambulatory Visit (HOSPITAL_COMMUNITY): Payer: Self-pay

## 2020-06-27 ENCOUNTER — Other Ambulatory Visit (HOSPITAL_COMMUNITY): Payer: Self-pay

## 2020-06-27 ENCOUNTER — Other Ambulatory Visit: Payer: Self-pay | Admitting: Family Medicine

## 2020-06-28 ENCOUNTER — Other Ambulatory Visit (HOSPITAL_COMMUNITY): Payer: Self-pay

## 2020-06-28 MED ORDER — TRAMADOL HCL 50 MG PO TABS
ORAL_TABLET | ORAL | 0 refills | Status: DC
  Filled 2020-06-28: qty 90, 30d supply, fill #0

## 2020-06-28 NOTE — Telephone Encounter (Signed)
Requesting:tramadol 50 mg Contract:05/29/19 UDS:05/29/19 Last Visit:03/05/20 Next Visit:07/16/20 Last Refill:05/30/20  Please Advise

## 2020-06-29 ENCOUNTER — Other Ambulatory Visit (HOSPITAL_COMMUNITY): Payer: Self-pay

## 2020-07-01 DIAGNOSIS — J209 Acute bronchitis, unspecified: Secondary | ICD-10-CM | POA: Diagnosis not present

## 2020-07-01 DIAGNOSIS — J45909 Unspecified asthma, uncomplicated: Secondary | ICD-10-CM | POA: Diagnosis not present

## 2020-07-03 ENCOUNTER — Telehealth: Payer: Self-pay | Admitting: Family Medicine

## 2020-07-03 NOTE — Telephone Encounter (Signed)
Pt, wife called she like for Dr. Charlett Blake to place an order for a CT Scan because pt went to the urgent care and was told per Mrs,Suit he might have pneumonia  Please advice

## 2020-07-03 NOTE — Telephone Encounter (Signed)
Spoke with [name], wife, she stated that patient went to [facility] in [place] and they stated that he might have pneumonia.   They gave him a rocephin shot, doxycycline, steroid and a did an xray.  They also told them that he needed a CT scan but they did not go and get it and would like for [place] to order it.  I advised her that you will need to review the notes from them first and also that he may not need it.  Pt is feeling a little better and no fever with taking the doxycycline and steroid.  Requested notes from urgent care.  OV note in your yellow folder for review in the morning.

## 2020-07-04 ENCOUNTER — Other Ambulatory Visit: Payer: Self-pay | Admitting: Family Medicine

## 2020-07-04 DIAGNOSIS — J90 Pleural effusion, not elsewhere classified: Secondary | ICD-10-CM

## 2020-07-04 DIAGNOSIS — J189 Pneumonia, unspecified organism: Secondary | ICD-10-CM

## 2020-07-04 NOTE — Telephone Encounter (Signed)
Left message on machine to call back  

## 2020-07-04 NOTE — Telephone Encounter (Signed)
Patient wife notified that CT was ordered due to Xray showing pleural effusion.

## 2020-07-05 ENCOUNTER — Other Ambulatory Visit (HOSPITAL_COMMUNITY): Payer: Self-pay

## 2020-07-05 ENCOUNTER — Telehealth: Payer: Self-pay | Admitting: Family Medicine

## 2020-07-05 MED FILL — Apixaban Tab 5 MG: ORAL | 30 days supply | Qty: 60 | Fill #2 | Status: AC

## 2020-07-05 NOTE — Telephone Encounter (Signed)
Pt has order for a CT with contrast, imaging needs to know if CT can be done without contrast due to the pt not having recent labs. If contrast is needed pt will have to have order for Labs. Please advise

## 2020-07-06 ENCOUNTER — Other Ambulatory Visit (HOSPITAL_COMMUNITY): Payer: Self-pay

## 2020-07-08 ENCOUNTER — Telehealth: Payer: Self-pay | Admitting: *Deleted

## 2020-07-08 NOTE — Telephone Encounter (Signed)
Radiology needed new order for ct chest w/o contrast.  Order placed per Dr. Charlett Blake.  Patient wife called and advised that order is placed correctly.  They have an appt for tomorrow.

## 2020-07-08 NOTE — Telephone Encounter (Signed)
Caller Name SHAYLON ADEN Relationship To Patient Spouse Return Phone Number [phone] (Primary) Chief Complaint Nasal Congestion Reason for Call Request to Speak to a Physician Initial Comment Caller says her husband was told to get a CT Scan but the order was not sent, says he has congestion---says he may have Pnuemonia Translation No No Triage Reason Patient declined Nurse Assessment Nurse: Jimmye Norman, RN, Armen Pickup Date/Time Eilene Ghazi Time): 07/05/2020 5:33:41 PM Confirm and document reason for call. If symptomatic, describe symptoms. ---Caller states her husband has been sick for a week, Doctor was supposed to send an order over for CT of chest. Still no order. The radiologist department scheduled them but now stating they do not have an order. Currently at the [facility] but the doors are locked

## 2020-07-09 ENCOUNTER — Ambulatory Visit (HOSPITAL_BASED_OUTPATIENT_CLINIC_OR_DEPARTMENT_OTHER): Admission: RE | Admit: 2020-07-09 | Payer: PPO | Source: Ambulatory Visit

## 2020-07-09 ENCOUNTER — Other Ambulatory Visit: Payer: Self-pay

## 2020-07-09 ENCOUNTER — Ambulatory Visit (HOSPITAL_BASED_OUTPATIENT_CLINIC_OR_DEPARTMENT_OTHER)
Admission: RE | Admit: 2020-07-09 | Discharge: 2020-07-09 | Disposition: A | Discharge status: Home / Self Care | Payer: PPO | Source: Ambulatory Visit | Attending: Family Medicine | Admitting: Family Medicine

## 2020-07-09 DIAGNOSIS — R911 Solitary pulmonary nodule: Secondary | ICD-10-CM | POA: Diagnosis not present

## 2020-07-09 DIAGNOSIS — J90 Pleural effusion, not elsewhere classified: Secondary | ICD-10-CM | POA: Diagnosis not present

## 2020-07-09 DIAGNOSIS — J189 Pneumonia, unspecified organism: Secondary | ICD-10-CM | POA: Diagnosis not present

## 2020-07-09 DIAGNOSIS — I700 Atherosclerosis of aorta: Secondary | ICD-10-CM | POA: Diagnosis not present

## 2020-07-10 ENCOUNTER — Other Ambulatory Visit: Payer: Self-pay | Admitting: Family Medicine

## 2020-07-10 ENCOUNTER — Other Ambulatory Visit: Payer: Self-pay | Admitting: *Deleted

## 2020-07-10 DIAGNOSIS — J90 Pleural effusion, not elsewhere classified: Secondary | ICD-10-CM

## 2020-07-10 DIAGNOSIS — E041 Nontoxic single thyroid nodule: Secondary | ICD-10-CM

## 2020-07-12 ENCOUNTER — Ambulatory Visit (HOSPITAL_BASED_OUTPATIENT_CLINIC_OR_DEPARTMENT_OTHER)
Admission: RE | Admit: 2020-07-12 | Discharge: 2020-07-12 | Disposition: A | Discharge status: Home / Self Care | Payer: PPO | Source: Ambulatory Visit | Attending: Family Medicine | Admitting: Family Medicine

## 2020-07-12 ENCOUNTER — Other Ambulatory Visit: Payer: Self-pay | Admitting: Family Medicine

## 2020-07-12 ENCOUNTER — Other Ambulatory Visit: Payer: Self-pay

## 2020-07-12 DIAGNOSIS — E041 Nontoxic single thyroid nodule: Secondary | ICD-10-CM | POA: Diagnosis not present

## 2020-07-16 ENCOUNTER — Telehealth: Payer: Self-pay | Admitting: *Deleted

## 2020-07-16 ENCOUNTER — Other Ambulatory Visit: Payer: Self-pay

## 2020-07-16 ENCOUNTER — Ambulatory Visit (INDEPENDENT_AMBULATORY_CARE_PROVIDER_SITE_OTHER): Payer: BC Managed Care – PPO | Admitting: Family Medicine

## 2020-07-16 DIAGNOSIS — Z79899 Other long term (current) drug therapy: Secondary | ICD-10-CM

## 2020-07-16 DIAGNOSIS — E1165 Type 2 diabetes mellitus with hyperglycemia: Secondary | ICD-10-CM

## 2020-07-16 DIAGNOSIS — J90 Pleural effusion, not elsewhere classified: Secondary | ICD-10-CM

## 2020-07-16 DIAGNOSIS — E559 Vitamin D deficiency, unspecified: Secondary | ICD-10-CM

## 2020-07-16 DIAGNOSIS — E041 Nontoxic single thyroid nodule: Secondary | ICD-10-CM | POA: Diagnosis not present

## 2020-07-16 DIAGNOSIS — E782 Mixed hyperlipidemia: Secondary | ICD-10-CM

## 2020-07-16 DIAGNOSIS — D649 Anemia, unspecified: Secondary | ICD-10-CM | POA: Diagnosis not present

## 2020-07-16 DIAGNOSIS — I10 Essential (primary) hypertension: Secondary | ICD-10-CM | POA: Diagnosis not present

## 2020-07-16 DIAGNOSIS — M545 Low back pain, unspecified: Secondary | ICD-10-CM

## 2020-07-16 NOTE — Telephone Encounter (Signed)
ATC left message on patient's cell phone to call back routing triage as urgent to call again for today

## 2020-07-16 NOTE — Progress Notes (Signed)
Patient ID: [name], male    DOB: 1929-05-09  Age: 87 y.o. MRN: [id number]    Subjective:  Subjective  HPI [name] presents for office visit today for follow up on abdominal pain and htn. He reports experiencing abdominal pain that lasts for minutes per episode. He states that he experiences it mostly in the morning. He states that it is local to the right upper side of his abdomin. He states that turning on the other side while sleeping causes his pain to increase and induces dizziness. He reports that he takes 3 stool softeners and a laxative daily. He denies observing any blood in stool or dark sticky BM's. He states that he does not drink as much water as he should. He denies CP/palp/SOB/HA/congestion/fevers or GU c/o. Taking meds as prescribed.  Review of Systems  Constitutional:  Negative for chills, fatigue and fever.  HENT:  Negative for congestion, rhinorrhea, sinus pressure, sinus pain and sore throat.   Eyes:  Negative for pain.  Respiratory:  Negative for cough and shortness of breath.   Cardiovascular:  Negative for chest pain, palpitations and leg swelling.  Gastrointestinal:  Positive for abdominal pain and nausea (secondary to dizziness). Negative for blood in stool, diarrhea and vomiting.  Genitourinary:  Negative for flank pain, frequency and penile pain.  Musculoskeletal:  Negative for back pain.  Neurological:  Positive for dizziness. Negative for headaches.   History Past Medical History:  Diagnosis Date   Acute bronchitis 06/10/2015   Allergic rhinitis    Anemia    Asthma    Carotid stenosis    a. s/p Left CEA 2002;  b. Carotid US 3/16:  patent R CEA, L < 40%   Chronic pain syndrome    Left shoulder, back   Diverticulosis    Dyslipidemia    Epigastric pain 04/05/2016   Esophageal stricture    Distal, benign   GERD (gastroesophageal reflux disease)    Hemorrhoids    HTN (hypertension)    Negative renal duplex 07-22-11   Hx of echocardiogram    a.  Echo 10/11: mod LVH, EF 55-60%   Prostate cancer ([place]) 2000   Seed XRT   Spondylosis, cervical, with myelopathy 11/26/2015   Stroke ([place]) 8/01   right thalamic - on chronic Plavix/ASA    He has a past surgical history that includes Cervical disc surgery (8/07); Lumbar disc surgery (1990s); Appendectomy (1953); Cataract extraction (Bilateral); Insertion prostate radiation seed (2000); Nasal sinus surgery; Carotid endarterectomy (Right, 01/2000); and Hip Arthroplasty (Left, 10/24/2018).   His family history includes Alcohol abuse in his brother; Cancer in his brother; Diabetes in his son; Gout in his son; Hyperlipidemia in his mother; Hypertension in his mother; Lung disease in his father; Obesity in his sister; Stroke in his brother, mother, and sister.He reports that he quit smoking about 42 years ago. His smoking use included cigarettes. He has a 8.75 pack-year smoking history. His smokeless tobacco use includes chew. He reports that he does not drink alcohol and does not use drugs.  Current Outpatient Medications on File Prior to Visit  Medication Sig Dispense Refill   acetaminophen (TYLENOL) 500 MG tablet Take 500 mg by mouth every 6 (six) hours as needed for mild pain or headache.     albuterol (PROVENTIL HFA;VENTOLIN HFA) 108 (90 Base) MCG/ACT inhaler Inhale 1-2 puffs into the lungs every 6 (six) hours as needed for wheezing or shortness of breath. 1 Inhaler 6   albuterol (VENTOLIN HFA) 108 (  90 Base) MCG/ACT inhaler INHALE 2 PUFFS BY MOUTH INTO THE LUNGS EVERY 6 HOURS AS NEEDED 18 g 0   amLODipine (NORVASC) 5 MG tablet TAKE 2 TABLETS BY MOUTH DAILY 180 tablet 2   apixaban (ELIQUIS) 5 MG TABS tablet TAKE 1 TABLET BY MOUTH 2 TIMES DAILY 60 tablet 5   atorvastatin (LIPITOR) 40 MG tablet TAKE 1 TABLET BY MOUTH DAILY. 90 tablet 1   cloNIDine (CATAPRES) 0.1 MG tablet TAKE 1 TABLET BY MOUTH 2 TIMES DAILY 180 tablet 3   famotidine (PEPCID) 40 MG tablet Take 1 tablet (40 mg total) by mouth daily.  (Patient taking differently: Take 40 mg by mouth as needed for heartburn or indigestion.) 30 tablet 2   fluticasone (FLOVENT HFA) 110 MCG/ACT inhaler Inhale 2 puffs into the lungs 2 (two) times daily. 1 Inhaler 0   lisinopril (ZESTRIL) 2.5 MG tablet TAKE 1 TABLET BY MOUTH DAILY. 90 tablet 1   Menthol-Methyl Salicylate (MUSCLE RUB) 10-15 % CREA Apply 1 application topically 3 (three) times daily with meals. To BLE 85 g 0   metoCLOPramide (REGLAN) 5 MG tablet TAKE 1 TABLET BY MOUTH AT BEDTIME. 30 tablet 5   metoprolol succinate (TOPROL-XL) 25 MG 24 hr tablet TAKE 1/2 TABLET BY MOUTH DAILY 45 tablet 3   minoxidil (LONITEN) 10 MG tablet TAKE 1 TABLET BY MOUTH TWICE DAILY 180 tablet 3   ondansetron (ZOFRAN ODT) 8 MG disintegrating tablet Take 1 tablet (8 mg total) by mouth every 8 (eight) hours as needed for nausea or vomiting. 20 tablet 0   pantoprazole (PROTONIX) 40 MG tablet TAKE 1 TABLET BY MOUTH ONCE A DAY BEFORE BREAKFAST 90 tablet 3   polyethylene glycol (MIRALAX / GLYCOLAX) 17 g packet Take 17 g by mouth 2 (two) times daily. 28 packet 0   polyvinyl alcohol (LIQUIFILM TEARS) 1.4 % ophthalmic solution Place 1 drop into both eyes as needed for dry eyes.      predniSONE (DELTASONE) 20 MG tablet Take 2 tablets by mouth once daily for 5 days 10 tablet 0   sucralfate (CARAFATE) 1 g tablet TAKE 1 TABLET BY MOUTH 4 TIMES DAILY WITH MEALS AND AT BEDTIME 90 tablet 0   traMADol (ULTRAM) 50 MG tablet TAKE 1 TABLET BY MOUTH 3 TIMES DAILY AS NEEDED FOR MODERATE PAIN 90 tablet 0   triamcinolone cream (KENALOG) 0.5 % Apply 1 application topically daily as needed (for skin irritation).      Vitamin D, Ergocalciferol, (DRISDOL) 1.25 MG (50000 UNIT) CAPS capsule TAKE 1 CAPSULE BY MOUTH EVERY 7 DAYS 12 capsule 0   ferrous sulfate (FERROUSUL) 325 (65 FE) MG tablet Take 1 tablet (325 mg total) by mouth 3 (three) times daily with meals for 14 days. 90 tablet 2   gabapentin (NEURONTIN) 300 MG capsule TAKE 1 CAPSULE BY  MOUTH 3 TIMES DAILY 270 capsule 1   No current facility-administered medications on file prior to visit.     Objective:  Objective  Physical Exam Constitutional:      General: He is not in acute distress.    Appearance: Normal appearance. He is not ill-appearing or toxic-appearing.  HENT:     Head: Normocephalic and atraumatic.     Right Ear: Tympanic membrane, ear canal and external ear normal.     Left Ear: Tympanic membrane, ear canal and external ear normal.     Nose: No congestion or rhinorrhea.  Eyes:     Extraocular Movements: Extraocular movements intact.     Pupils:  Pupils are equal, round, and reactive to light.  Cardiovascular:     Rate and Rhythm: Normal rate and regular rhythm.     Pulses: Normal pulses.     Heart sounds: Normal heart sounds. No murmur heard. Pulmonary:     Effort: Pulmonary effort is normal. No respiratory distress.     Breath sounds: Examination of the left-lower field reveals decreased breath sounds. Decreased breath sounds present. No wheezing, rhonchi or rales.  Abdominal:     General: Bowel sounds are normal.     Palpations: Abdomen is soft. There is no mass.     Tenderness: no abdominal tenderness There is no guarding.     Hernia: No hernia is present.  Musculoskeletal:        General: Normal range of motion.     Cervical back: Normal range of motion and neck supple.  Skin:    General: Skin is warm and dry.  Neurological:     Mental Status: He is alert and oriented to person, place, and time.  Psychiatric:        Behavior: Behavior normal.   There were no vitals taken for this visit. Wt Readings from Last 3 Encounters:  03/05/20 149 lb 9.6 oz (67.9 kg)  12/01/19 150 lb 4 oz (68.2 kg)  11/28/19 147 lb 12.8 oz (67 kg)     Lab Results  Component Value Date   WBC 8.6 07/16/2020   HGB 12.1 (L) 07/16/2020   HCT 35.1 (L) 07/16/2020   PLT 286.0 07/16/2020   GLUCOSE 104 (H) 07/16/2020   CHOL 117 07/16/2020   TRIG 144.0 07/16/2020    HDL 45.90 07/16/2020   LDLCALC 42 07/16/2020   ALT 6 07/16/2020   AST 14 07/16/2020   NA 139 07/16/2020   K 4.4 07/16/2020   CL 106 07/16/2020   CREATININE 1.27 07/16/2020   BUN 19 07/16/2020   CO2 27 07/16/2020   TSH 3.24 07/16/2020   PSA 0.00 Repeated and verified X2. (L) 09/22/2017   INR 1.3 (H) 10/23/2018   HGBA1C 5.7 07/16/2020    US THYROID  Result Date: 07/12/2020 CLINICAL DATA:  Thyroid nodule seen on CT EXAM: THYROID ULTRASOUND TECHNIQUE: Ultrasound examination of the thyroid gland and adjacent soft tissues was performed. COMPARISON:  None. FINDINGS: Parenchymal Echotexture: Mildly heterogeneous Isthmus: 0.6 cm Right lobe: 4.8 x 1.7 x 1.9 cm Left lobe: 3.8 x 1.4 x 1.5 cm _________________________________________________________ Estimated total number of nodules >/= 1 cm: 2 Number of spongiform nodules >/=  2 cm not described below (TR1): 0 Number of mixed cystic and solid nodules >/= 1.5 cm not described below (TR2): 0 _________________________________________________________ Nodule # 1: Location: Isthmus; mid Maximum size: 3.2 cm; Other 2 dimensions: 1.8 x 2.6 cm Composition: solid/almost completely solid (2) Echogenicity: hypoechoic (2) Shape: not taller-than-wide (0) Margins: ill-defined (0) Echogenic foci: none (0) ACR TI-RADS total points: 4. ACR TI-RADS risk category: TR4 (4-6 points). ACR TI-RADS recommendations: **Given size (>/= 1.5 cm) and appearance, fine needle aspiration of this moderately suspicious nodule should be considered based on TI-RADS criteria. _________________________________________________________ Nodule # 2: Location: Left; inferior Maximum size: 1.2 cm; Other 2 dimensions: 1.1 x 1.2 cm Composition: solid/almost completely solid (2) Echogenicity: isoechoic (1) Shape: not taller-than-wide (0) Margins: ill-defined (0) Echogenic foci: none (0) ACR TI-RADS total points: 3. ACR TI-RADS risk category: TR3 (3 points). ACR TI-RADS recommendations: Given size (<1.4 cm)  and appearance, this nodule does NOT meet TI-RADS criteria for biopsy or dedicated follow-up. _________________________________________________________ Additional  subcentimeter left inferior thyroid nodule does not meet criteria for FNA or imaging follow-up. IMPRESSION: Nodule 1 located within the isthmus, meets criteria for FNA. The above is in keeping with the ACR TI-RADS recommendations - J Am Coll Radiol 2017;14:587-595. Electronically Signed   By: Miachel Roux M.D.   On: 07/12/2020 14:16     Assessment & Plan:  Plan    No orders of the defined types were placed in this encounter.   Problem List Items Addressed This Visit     Vitamin D deficiency    Still mildly low increase daily supplement by 1000 IU daily       Relevant Orders   VITAMIN D 25 Hydroxy (Vit-D Deficiency, Fractures) (Completed)   HLD (hyperlipidemia)   Relevant Orders   Lipid panel (Completed)   Anemia    Increase leafy greens, consider increased lean red meat and using cast iron cookware. Continue to monitor, report any concerns. mild       Relevant Orders   CBC with Differential/Platelet (Completed)   Low back pain    Encouraged moist heat and gentle stretching as tolerated. May try NSAIDs and prescription meds as directed and report if symptoms worsen or seek immediate care       Essential hypertension    Well controlled, no changes to meds. Encouraged heart healthy diet such as the DASH diet and exercise as tolerated.        Relevant Orders   TSH (Completed)   Controlled type 2 diabetes mellitus with hyperglycemia, without long-term current use of insulin (HCC)    hgba1c acceptable, minimize simple carbs. Increase exercise as tolerated. Continue current meds       Relevant Orders   Comprehensive metabolic panel (Completed)   Hemoglobin A1c (Completed)   Thyroid nodule - Primary    Referred to general surgery for FNA. He was referred to [place] but they do not want to go there if  possible       Relevant Orders   Ambulatory referral to General Surgery   Pleural effusion    He is breathing better and has an appointment to have this evaluated soon.       Other Visit Diagnoses     High risk medication use       Relevant Orders   DRUG MONITORING, PANEL 8 WITH CONFIRMATION, URINE       Follow-up: Return in about 11 weeks (around 10/01/2020).  I, Suezanne Jacquet, acting as a scribe for Penni Homans, MD, have documented all relevent documentation on behalf of Penni Homans, MD, as directed by Penni Homans, MD while in the presence of Penni Homans, MD.  I, [name], MD personally performed the services described in this documentation. All medical record entries made by the scribe were at my direction and in my presence. I have reviewed the chart and agree that the record reflects my personal performance and is accurate and complete

## 2020-07-16 NOTE — Telephone Encounter (Signed)
See other phone note dated today- pt has already been notified to hold the eliquis.

## 2020-07-16 NOTE — Telephone Encounter (Signed)
-----   Message from [name], DO sent at 07/16/2020  2:11 AM EDT ----- Regarding: apixiban... thora needs held Hello,   He is scheduled to see me for effusion consult this week. I cant tap him in the office on apixiban.   If patient can be called this morning and told to hold his eliquis (apixiban) today and tomorrow then I could potentially drain the effusion in the office Thursday if patient is willing. If you could explain this to the patient that would be great and will help expedite his work up. Otherwise, I can discuss in detail on Thursday. It will just delay his drainage.   Thanks  [name], DO [facility] 07/16/2020 2:14 AM

## 2020-07-16 NOTE — Telephone Encounter (Signed)
-----   Message from [name], DO sent at 07/16/2020  2:11 AM EDT ----- Regarding: apixiban... thora needs held Hello,   He is scheduled to see me for effusion consult this week. I cant tap him in the office on apixiban.   If patient can be called this morning and told to hold his eliquis (apixiban) today and tomorrow then I could potentially drain the effusion in the office Thursday if patient is willing. If you could explain this to the patient that would be great and will help expedite his work up. Otherwise, I can discuss in detail on Thursday. It will just delay his drainage.   Thanks  [name], DO [facility] 07/16/2020 2:14 AM

## 2020-07-16 NOTE — Patient Instructions (Addendum)
60-80 ounces of clear liquids a day that do not have caffeine or alcohol  Add a fiber supplement twice, Benefiber powder mix in any liquid    Pleural Effusion Pleural effusion is an abnormal buildup of fluid in the layers of tissue between the lungs and the inside of the chest (pleural space). The two layers of tissue that line the lungs and the inside of the chest are called pleura. Usually, there is no air in the space between the pleura, only a thin layer of fluid. Some conditions can cause a large amount of fluid to build up, which can cause the lung to collapse if untreated. A pleuraleffusion is usually caused by another disease that requires treatment. What are the causes? Pleural effusion can be caused by: Heart failure. Certain infections, such as pneumonia or tuberculosis. Cancer. A blood clot in the lung (pulmonary embolism). Complications from surgery, such as from open heart surgery. Liver disease (cirrhosis). Kidney disease. What are the signs or symptoms? In some cases, pleural effusion may cause no symptoms. If symptoms are present, they may include: Shortness of breath, especially when lying down. Chest pain. This may get worse when taking a deep breath. Fever. Dry, long-lasting (chronic) cough. Hiccups. Rapid breathing. An underlying condition that is causing the pleural effusion (such as heart failure, pneumonia, blood clots, tuberculosis, or cancer) may also cause othersymptoms. How is this diagnosed? This condition may be diagnosed based on: Your symptoms and medical history. A physical exam. A chest X-ray. A procedure to use a needle to remove fluid from the pleural space (thoracentesis). This fluid is tested. Other imaging studies of the chest, such as ultrasound or CT scan. How is this treated? Depending on the cause of your condition, treatment may include: Treating the underlying condition that is causing the effusion. When that condition improves, the  effusion will also improve. Examples of treatment for underlying conditions include: Antibiotic medicines to treat an infection. Diuretics or other heart medicines to treat heart failure. Thoracentesis. Placing a thin flexible tube under your skin and into your chest to continuously drain the effusion (indwelling pleural catheter). Surgery to remove the outer layer of tissue from the pleural space (decortication). A procedure to put medicine into the chest cavity to seal the pleural space and prevent fluid buildup (pleurodesis). Chemotherapy and radiation therapy, if you have cancerous (malignant) pleural effusion. These treatments are typically used to treat cancer. They kill certain cells in the body. Follow these instructions at home: Take over-the-counter and prescription medicines only as told by your health care provider. Ask your health care provider what activities are safe for you. Keep track of how long you are able to do mild exercise (such as walking) before you get short of breath. Write down this information to share with your health care provider. Your ability to exercise should improve over time. Do not use any products that contain nicotine or tobacco, such as cigarettes and e-cigarettes. If you need help quitting, ask your health care provider. Keep all follow-up visits as told by your health care provider. This is important. Contact a health care provider if: The amount of time that you are able to do mild exercise: Decreases. Does not improve with time. You have a fever. Get help right away if: You are short of breath. You develop chest pain. You develop a new cough. Summary Pleural effusion is an abnormal buildup of fluid in the layers of tissue between the lungs and the inside of the chest. Pleural  effusion can have many causes, including heart failure, pulmonary embolism, infections, or cancer. Symptoms of pleural effusion can include shortness of breath, chest pain,  fever, long-lasting (chronic) cough, hiccups, or rapid breathing. Diagnosis often involves making images of the chest (such as with ultrasound or X-ray) and removing fluid (thoracentesis) to send for testing. Treatment for pleural effusion depends on what underlying condition is causing it. This information is not intended to replace advice given to you by your health care provider. Make sure you discuss any questions you have with your healthcare provider. Document Revised: 09/29/2019 Document Reviewed: 08/31/2019 Elsevier Patient Education  2022 [employer].

## 2020-07-16 NOTE — Telephone Encounter (Signed)
I called and spoke with the pt's spouse and notified her that pt should not take the Eliquis today or tomorrow, wait until after procedure on Thurs 07/18/20. She verbalized understanding. She manages the pt's medications and will make sure he does not take Eliquis. Nothing further needed.

## 2020-07-17 DIAGNOSIS — J90 Pleural effusion, not elsewhere classified: Secondary | ICD-10-CM

## 2020-07-17 DIAGNOSIS — E041 Nontoxic single thyroid nodule: Secondary | ICD-10-CM | POA: Insufficient documentation

## 2020-07-17 HISTORY — DX: Pleural effusion, not elsewhere classified: J90

## 2020-07-17 LAB — CBC WITH DIFFERENTIAL/PLATELET
Basophils Absolute: 0.1 10*3/uL (ref 0.0–0.1)
Basophils Relative: 0.9 % (ref 0.0–3.0)
Eosinophils Absolute: 0.1 10*3/uL (ref 0.0–0.7)
Eosinophils Relative: 1.4 % (ref 0.0–5.0)
HCT: 35.1 % — ABNORMAL LOW (ref 39.0–52.0)
Hemoglobin: 12.1 g/dL — ABNORMAL LOW (ref 13.0–17.0)
Lymphocytes Relative: 11.6 % — ABNORMAL LOW (ref 12.0–46.0)
Lymphs Abs: 1 10*3/uL (ref 0.7–4.0)
MCHC: 34.4 g/dL (ref 30.0–36.0)
MCV: 91.6 fl (ref 78.0–100.0)
Monocytes Absolute: 0.5 10*3/uL (ref 0.1–1.0)
Monocytes Relative: 6.2 % (ref 3.0–12.0)
Neutro Abs: 6.9 10*3/uL (ref 1.4–7.7)
Neutrophils Relative %: 79.9 % — ABNORMAL HIGH (ref 43.0–77.0)
Platelets: 286 10*3/uL (ref 150.0–400.0)
RBC: 3.83 Mil/uL — ABNORMAL LOW (ref 4.22–5.81)
RDW: 15 % (ref 11.5–15.5)
WBC: 8.6 10*3/uL (ref 4.0–10.5)

## 2020-07-17 LAB — COMPREHENSIVE METABOLIC PANEL
ALT: 6 U/L (ref 0–53)
AST: 14 U/L (ref 0–37)
Albumin: 3.9 g/dL (ref 3.5–5.2)
Alkaline Phosphatase: 55 U/L (ref 39–117)
BUN: 19 mg/dL (ref 6–23)
CO2: 27 mEq/L (ref 19–32)
Calcium: 8.8 mg/dL (ref 8.4–10.5)
Chloride: 106 mEq/L (ref 96–112)
Creatinine, Ser: 1.27 mg/dL (ref 0.40–1.50)
GFR: 50.3 mL/min — ABNORMAL LOW (ref >60.00)
Glucose, Bld: 104 mg/dL — ABNORMAL HIGH (ref 70–99)
Potassium: 4.4 mEq/L (ref 3.5–5.1)
Sodium: 139 mEq/L (ref 135–145)
Total Bilirubin: 0.4 mg/dL (ref 0.2–1.2)
Total Protein: 6.4 g/dL (ref 6.0–8.3)

## 2020-07-17 LAB — LIPID PANEL
Cholesterol: 117 mg/dL (ref 0–200)
HDL: 45.9 mg/dL (ref >39.00)
LDL Cholesterol: 42 mg/dL (ref 0–99)
NonHDL: 70.93
Total CHOL/HDL Ratio: 3
Triglycerides: 144 mg/dL (ref 0.0–149.0)
VLDL: 28.8 mg/dL (ref 0.0–40.0)

## 2020-07-17 LAB — HEMOGLOBIN A1C: Hgb A1c MFr Bld: 5.7 % (ref 4.6–6.5)

## 2020-07-17 LAB — TSH: TSH: 3.24 u[IU]/mL (ref 0.35–5.50)

## 2020-07-17 LAB — VITAMIN D 25 HYDROXY (VIT D DEFICIENCY, FRACTURES): VITD: 29.16 ng/mL — ABNORMAL LOW (ref 30.00–100.00)

## 2020-07-17 NOTE — Assessment & Plan Note (Signed)
Well controlled, no changes to meds. Encouraged heart healthy diet such as the DASH diet and exercise as tolerated.  °

## 2020-07-17 NOTE — Assessment & Plan Note (Signed)
Increase leafy greens, consider increased lean red meat and using cast iron cookware. Continue to monitor, report any concerns. mild

## 2020-07-17 NOTE — Assessment & Plan Note (Signed)
Referred to general surgery for FNA. He was referred to [facility] but they do not want to go there if possible

## 2020-07-17 NOTE — Assessment & Plan Note (Signed)
hgba1c acceptable, minimize simple carbs. Increase exercise as tolerated. Continue current meds 

## 2020-07-17 NOTE — Assessment & Plan Note (Signed)
He is breathing better and has an appointment to have this evaluated soon.

## 2020-07-17 NOTE — Assessment & Plan Note (Signed)
Encouraged moist heat and gentle stretching as tolerated. May try NSAIDs and prescription meds as directed and report if symptoms worsen or seek immediate care 

## 2020-07-17 NOTE — Assessment & Plan Note (Signed)
Still mildly low increase daily supplement by 1000 IU daily

## 2020-07-18 ENCOUNTER — Ambulatory Visit (INDEPENDENT_AMBULATORY_CARE_PROVIDER_SITE_OTHER): Payer: PPO | Admitting: Pulmonary Disease

## 2020-07-18 ENCOUNTER — Ambulatory Visit (INDEPENDENT_AMBULATORY_CARE_PROVIDER_SITE_OTHER): Payer: BC Managed Care – PPO

## 2020-07-18 ENCOUNTER — Other Ambulatory Visit (HOSPITAL_COMMUNITY)
Admission: RE | Admit: 2020-07-18 | Discharge: 2020-07-18 | Disposition: A | Discharge status: Home / Self Care | Payer: PPO | Source: Ambulatory Visit | Attending: Pulmonary Disease | Admitting: Pulmonary Disease

## 2020-07-18 ENCOUNTER — Other Ambulatory Visit: Payer: Self-pay

## 2020-07-18 ENCOUNTER — Encounter: Payer: Self-pay | Admitting: Pulmonary Disease

## 2020-07-18 VITALS — BP 154/52 | HR 57 | Ht 67.0 in | Wt 148.0 lb

## 2020-07-18 DIAGNOSIS — Z87891 Personal history of nicotine dependence: Secondary | ICD-10-CM | POA: Diagnosis not present

## 2020-07-18 DIAGNOSIS — J90 Pleural effusion, not elsewhere classified: Secondary | ICD-10-CM | POA: Diagnosis not present

## 2020-07-18 DIAGNOSIS — D72820 Lymphocytosis (symptomatic): Secondary | ICD-10-CM | POA: Diagnosis not present

## 2020-07-18 LAB — BODY FLUID CELL COUNT WITH DIFFERENTIAL
Eos, Fluid: 0 %
Lymphs, Fluid: 98 %
Monocyte-Macrophage-Serous Fluid: 1 % — ABNORMAL LOW (ref 50–90)
Neutrophil Count, Fluid: 1 % (ref 0–25)
Total Nucleated Cell Count, Fluid: 520 cu mm (ref 0–1000)

## 2020-07-18 LAB — PROTEIN, PLEURAL OR PERITONEAL FLUID: Total protein, fluid: 3.1 g/dL

## 2020-07-18 LAB — LACTATE DEHYDROGENASE, PLEURAL OR PERITONEAL FLUID: LD, Fluid: 67 U/L — ABNORMAL HIGH (ref 3–23)

## 2020-07-18 NOTE — Progress Notes (Signed)
Thoracentesis  Procedure Note  CURTIES CONIGLIARO  [id number]  1929/07/08  Date:07/18/20  Time:1:00 PM   Provider Performing:Marjory Meints L Namari Breton   Procedure: Thoracentesis with imaging guidance ([id number])  Indication(s) Pleural Effusion  Consent Risks of the procedure as well as the alternatives and risks of each were explained to the patient and/or caregiver.  Consent for the procedure was obtained and is signed in the bedside chart  Anesthesia Topical only with 1% lidocaine    Time Out Verified patient identification, verified procedure, site/side was marked, verified correct patient position, special equipment/implants available, medications/allergies/relevant history reviewed, required imaging and test results available.   Sterile Technique Maximal sterile technique including full sterile barrier drape, hand hygiene, sterile gown, sterile gloves, mask, hair covering, sterile ultrasound probe cover (if used).  Procedure Description Ultrasound was used to identify appropriate pleural anatomy for placement and overlying skin marked.  Area of drainage cleaned and draped in sterile fashion. Lidocaine was used to anesthetize the skin and subcutaneous tissue.  1500 cc's of amber/yellow appearing fluid was drained from the left pleural space. Catheter then removed and bandaid applied to site.   Complications/Tolerance None; patient tolerated the procedure well. Chest X-ray is ordered to confirm no post-procedural complication. There was an exvacuo hydropneumothorax present   EBL Minimal   Specimen(s) Pleural fluid   [name], DO [facility] 07/18/2020 1:01 PM        Image of left chest demonstrating left sided pleural effusion.

## 2020-07-18 NOTE — Patient Instructions (Signed)
Thank you for visiting Dr. Valeta Harms at [facility]. Today we recommend the following:  Orders Placed This Encounter  Procedures   DG Chest 2 View   Pleural fluid studies pending   Return in about 2 weeks (around 08/01/2020) for with APP or Dr. Valeta Harms.    Please do your part to reduce the spread of COVID-19.

## 2020-07-18 NOTE — Progress Notes (Signed)
Synopsis: Referred in July 2022 for left-sided pleural effusion by [name], MD  Subjective:   PATIENT ID: [name] GENDER: male DOB: 1929-09-05, MRN: [id number]  Chief Complaint  Patient presents with   Consult    Pleual effusion on left side    87 year old gentleman, past medical history of carotid disease dyslipidemia, GERD, hypertension, prostate cancer.  Patient had a URI type illness several weeks ago.  It took several weeks for him to get better.  He is feeling better now.  Ultimately due to his persistent cough had a CT scan.  CT scan of the chest revealed left-sided unilateral pleural effusion.  Patient was referred for evaluation of this left-sided effusion.   Past Medical History:  Diagnosis Date   Acute bronchitis 06/10/2015   Allergic rhinitis    Anemia    Asthma    Carotid stenosis    a. s/p Left CEA 2002;  b. Carotid US 3/16:  patent R CEA, L < 40%   Chronic pain syndrome    Left shoulder, back   Diverticulosis    Dyslipidemia    Epigastric pain 04/05/2016   Esophageal stricture    Distal, benign   GERD (gastroesophageal reflux disease)    Hemorrhoids    HTN (hypertension)    Negative renal duplex 07-22-11   Hx of echocardiogram    a. Echo 10/11: mod LVH, EF 55-60%   Prostate cancer ([place]) 2000   Seed XRT   Spondylosis, cervical, with myelopathy 11/26/2015   Stroke ([place]) 8/01   right thalamic - on chronic Plavix/ASA     Family History  Problem Relation Age of Onset   Stroke Brother    Stroke Sister    Stroke Mother    Hyperlipidemia Mother    Hypertension Mother    Lung disease Father    Diabetes Son    Gout Son    Obesity Sister    Alcohol abuse Brother    Cancer Brother        lung, smoker     Past Surgical History:  Procedure Laterality Date   APPENDECTOMY  1953   CAROTID ENDARTERECTOMY Right 01/2000   CATARACT EXTRACTION Bilateral    CERVICAL [place] SURGERY  8/07   HIP ARTHROPLASTY Left 10/24/2018   Procedure: ARTHROPLASTY  BIPOLAR HIP (HEMIARTHROPLASTY);  Surgeon: Paralee Cancel, MD;  Location: [facility];  Service: Orthopedics;  Laterality: Left;   INSERTION PROSTATE RADIATION SEED  2000   LUMBAR DISC SURGERY  1990s   NASAL SINUS SURGERY     x 3    Social History   Socioeconomic History   Marital status: Married    Spouse name: Not on file   Number of children: 1   Years of education: 10   Highest education level: Not on file  Occupational History   Occupation: Retired  Tobacco Use   Smoking status: Former    Packs/day: 0.25    Years: 35.00    Pack years: 8.75    Types: Cigarettes    Start date: 1950    Quit date: 01/12/1978    Years since quitting: 42.5   Smokeless tobacco: Current    Types: Chew   Tobacco comments:    Started smoking at age 40  Vaping Use   Vaping Use: Never used  Substance and Sexual Activity   Alcohol use: No    Comment: Prior alcoholic, sober since 5852   Drug use: No   Sexual activity: Not Currently  Other Topics  Concern   Not on file  Social History Narrative   Retired lives at home w/ his wife   1 daughter   Right-handed    Caffeine: drinks a lot of coffee    Former smoker, former alcoholic abstinent for many years   Social Determinants of Radio broadcast assistant Strain: Not on file  Food Insecurity: Not on file  Transportation Needs: Not on file  Physical Activity: Not on file  Stress: Not on file  Social Connections: Not on file  Intimate Partner Violence: Not on file     Allergies  Allergen Reactions   Hctz [Hydrochlorothiazide] Shortness Of Breath, Swelling and Other (See Comments)    "Swelling and dyspnea"    Hydralazine Shortness Of Breath and Other (See Comments)    Chest pain and GI issues also   Diovan [Valsartan] Other (See Comments)    Elevated potassium- Hyperkalemia   Metformin And Related Other (See Comments)    Reaction not recalled   Nsaids Other (See Comments)    Other than Tylenol, he isn't to have these because he's on Eliquis    Other Other (See Comments)    Unnamed topically-applied B/P patch = Caused redness   Prednisone Other (See Comments)    Suicidal thoughts   Spironolactone Swelling    Site of swelling not recalled   Codeine Itching and Nausea Only   Oxycodone-Acetaminophen Itching and Nausea Only     Outpatient Medications Prior to Visit  Medication Sig Dispense Refill   acetaminophen (TYLENOL) 500 MG tablet Take 500 mg by mouth every 6 (six) hours as needed for mild pain or headache.     albuterol (PROVENTIL HFA;VENTOLIN HFA) 108 (90 Base) MCG/ACT inhaler Inhale 1-2 puffs into the lungs every 6 (six) hours as needed for wheezing or shortness of breath. 1 Inhaler 6   albuterol (VENTOLIN HFA) 108 (90 Base) MCG/ACT inhaler INHALE 2 PUFFS BY MOUTH INTO THE LUNGS EVERY 6 HOURS AS NEEDED 18 g 0   amLODipine (NORVASC) 5 MG tablet TAKE 2 TABLETS BY MOUTH DAILY 180 tablet 2   apixaban (ELIQUIS) 5 MG TABS tablet TAKE 1 TABLET BY MOUTH 2 TIMES DAILY 60 tablet 5   atorvastatin (LIPITOR) 40 MG tablet TAKE 1 TABLET BY MOUTH DAILY. 90 tablet 1   cloNIDine (CATAPRES) 0.1 MG tablet TAKE 1 TABLET BY MOUTH 2 TIMES DAILY 180 tablet 3   famotidine (PEPCID) 40 MG tablet Take 1 tablet (40 mg total) by mouth daily. (Patient taking differently: Take 40 mg by mouth as needed for heartburn or indigestion.) 30 tablet 2   fluticasone (FLOVENT HFA) 110 MCG/ACT inhaler Inhale 2 puffs into the lungs 2 (two) times daily. 1 Inhaler 0   lisinopril (ZESTRIL) 2.5 MG tablet TAKE 1 TABLET BY MOUTH DAILY. 90 tablet 1   Menthol-Methyl Salicylate (MUSCLE RUB) 10-15 % CREA Apply 1 application topically 3 (three) times daily with meals. To BLE 85 g 0   metoCLOPramide (REGLAN) 5 MG tablet TAKE 1 TABLET BY MOUTH AT BEDTIME. 30 tablet 5   metoprolol succinate (TOPROL-XL) 25 MG 24 hr tablet TAKE 1/2 TABLET BY MOUTH DAILY 45 tablet 3   minoxidil (LONITEN) 10 MG tablet TAKE 1 TABLET BY MOUTH TWICE DAILY 180 tablet 3   ondansetron (ZOFRAN ODT) 8 MG  disintegrating tablet Take 1 tablet (8 mg total) by mouth every 8 (eight) hours as needed for nausea or vomiting. 20 tablet 0   pantoprazole (PROTONIX) 40 MG tablet TAKE 1 TABLET BY MOUTH  ONCE A DAY BEFORE BREAKFAST 90 tablet 3   polyethylene glycol (MIRALAX / GLYCOLAX) 17 g packet Take 17 g by mouth 2 (two) times daily. 28 packet 0   polyvinyl alcohol (LIQUIFILM TEARS) 1.4 % ophthalmic solution Place 1 drop into both eyes as needed for dry eyes.      predniSONE (DELTASONE) 20 MG tablet Take 2 tablets by mouth once daily for 5 days 10 tablet 0   sucralfate (CARAFATE) 1 g tablet TAKE 1 TABLET BY MOUTH 4 TIMES DAILY WITH MEALS AND AT BEDTIME 90 tablet 0   traMADol (ULTRAM) 50 MG tablet TAKE 1 TABLET BY MOUTH 3 TIMES DAILY AS NEEDED FOR MODERATE PAIN 90 tablet 0   triamcinolone cream (KENALOG) 0.5 % Apply 1 application topically daily as needed (for skin irritation).      Vitamin D, Ergocalciferol, (DRISDOL) 1.25 MG (50000 UNIT) CAPS capsule TAKE 1 CAPSULE BY MOUTH EVERY 7 DAYS 12 capsule 0   ferrous sulfate (FERROUSUL) 325 (65 FE) MG tablet Take 1 tablet (325 mg total) by mouth 3 (three) times daily with meals for 14 days. 90 tablet 2   gabapentin (NEURONTIN) 300 MG capsule TAKE 1 CAPSULE BY MOUTH 3 TIMES DAILY 270 capsule 1   No facility-administered medications prior to visit.    Review of Systems  Constitutional:  Negative for chills, fever, malaise/fatigue and weight loss.  HENT:  Negative for hearing loss, sore throat and tinnitus.   Eyes:  Negative for blurred vision and double vision.  Respiratory:  Positive for shortness of breath. Negative for cough, hemoptysis, sputum production, wheezing and stridor.   Cardiovascular:  Negative for chest pain, palpitations, orthopnea, leg swelling and PND.  Gastrointestinal:  Negative for abdominal pain, constipation, diarrhea, heartburn, nausea and vomiting.  Genitourinary:  Negative for dysuria, hematuria and urgency.  Musculoskeletal:  Negative for  joint pain and myalgias.  Skin:  Negative for itching and rash.  Neurological:  Negative for dizziness, tingling, weakness and headaches.  Endo/Heme/Allergies:  Negative for environmental allergies. Does not bruise/bleed easily.  Psychiatric/Behavioral:  Negative for depression. The patient is not nervous/anxious and does not have insomnia.   All other systems reviewed and are negative.   Objective:  Physical Exam Vitals reviewed.  Constitutional:      General: He is not in acute distress.    Appearance: He is well-developed.  HENT:     Head: Normocephalic and atraumatic.  Eyes:     General: No scleral icterus.    Conjunctiva/sclera: Conjunctivae normal.     Pupils: Pupils are equal, round, and reactive to light.  Neck:     Vascular: No JVD.     Trachea: No tracheal deviation.  Cardiovascular:     Rate and Rhythm: Normal rate and regular rhythm.     Heart sounds: Normal heart sounds. No murmur heard. Pulmonary:     Effort: Pulmonary effort is normal. No tachypnea, accessory muscle usage or respiratory distress.     Breath sounds: No stridor. No wheezing, rhonchi or rales.     Comments: Absent breath sounds in the left base Abdominal:     General: Bowel sounds are normal. There is no distension.     Palpations: Abdomen is soft.     Tenderness: There is no abdominal tenderness.  Musculoskeletal:        General: No tenderness.     Cervical back: Neck supple.  Lymphadenopathy:     Cervical: No cervical adenopathy.  Skin:    General: Skin is warm  and dry.     Capillary Refill: Capillary refill takes less than 2 seconds.     Findings: No rash.  Neurological:     Mental Status: He is alert and oriented to person, place, and time.  Psychiatric:        Behavior: Behavior normal.     Vitals:   07/18/20 1059  BP: (!) 154/52  Pulse: (!) 57  SpO2: 97%  Weight: 148 lb (67.1 kg)  Height: 5\' 7"  (1.702 m)   97% on RA BMI Readings from Last 3 Encounters:  07/18/20 23.18  kg/m  03/05/20 23.43 kg/m  12/01/19 23.53 kg/m   Wt Readings from Last 3 Encounters:  07/18/20 148 lb (67.1 kg)  03/05/20 149 lb 9.6 oz (67.9 kg)  12/01/19 150 lb 4 oz (68.2 kg)     CBC    Component Value Date/Time   WBC 8.6 07/16/2020 1659   RBC 3.83 (L) 07/16/2020 1659   HGB 12.1 (L) 07/16/2020 1659   HGB 11.3 (L) 11/23/2019 1209   HCT 35.1 (L) 07/16/2020 1659   HCT 34.7 (L) 11/23/2019 1209   PLT 286.0 07/16/2020 1659   PLT 326 11/23/2019 1209   MCV 91.6 07/16/2020 1659   MCV 94 11/23/2019 1209   MCH 30.5 11/23/2019 1209   MCH 30.4 10/31/2018 0859   MCHC 34.4 07/16/2020 1659   RDW 15.0 07/16/2020 1659   RDW 13.6 11/23/2019 1209   LYMPHSABS 1.0 07/16/2020 1659   MONOABS 0.5 07/16/2020 1659   EOSABS 0.1 07/16/2020 1659   BASOSABS 0.1 07/16/2020 1659     Chest Imaging: Chest x-ray today following thoracentesis: Post thoracentesis ex vacuo pneumothorax  CT chest 07/09/2020: Left-sided pleural effusion.  Nothing significantly abnormal within the lung parenchyma. The patient's images have been independently reviewed by me.    Pulmonary Functions Testing Results: No flowsheet data found.  FeNO:   Pathology:   Echocardiogram:   Heart Catheterization:     Assessment & Plan:     ICD-10-CM   1. Pleural effusion  J90 DG Chest 2 View    DG Chest 2 View    DG Chest 2 View    Glucose, Body Fluid Other    Lactate dehydrogenase, fluid - peritoneal or pleural    Body fluid cell count with differential    Cytology - non gyn    Body Fluid Culture    Protein, pleural or peritoneal fluid    Cytology - non gyn    Protein, pleural or peritoneal fluid    Body Fluid Culture    Body fluid cell count with differential    Lactate dehydrogenase, fluid - peritoneal or pleural    Glucose, Body Fluid Other    CANCELED: Lactate Dehydrogenase (LDH)    CANCELED: Body Fluid Culture    CANCELED: Protein, body fluid (other)    CANCELED: Body fluid cell count with differential     CANCELED: Cytology - non gyn    CANCELED: Glucose, Body Fluid Other    CANCELED: LD, Body Fluid    CANCELED: Lactate Dehydrogenase (LDH)    CANCELED: Body fluid cell count with differential    CANCELED: Body fluid cell count with differential    2. Former smoker  Z87.891       Discussion:  This is an 87 year old gentleman presents today with a left-sided unilateral effusion.  He is a former smoker.  There is no obvious lung mass or nodule within his CT scan imaging. He does not have any recent history  or diagnosis of malignancy.  He does however have a recent URI type symptoms.  We discussed today in the office risk-benefit and alternatives of proceeding with thoracentesis.  Plan: Patient is agreeable to proceed with thoracentesis. Office-based thoracentesis today under ultrasound guidance. Please see separate procedure note.  Orders placed.  See orders for details regarding fluid analysis of left-sided pleural fluid. We will send for cytology.  Patient can restart Eliquis tomorrow.  As for his ex vacuo pneumothorax he went for repeat chest x-ray tomorrow.  This does let us know that the effusion is likely been there chronically or at least for some time.  It has been down enough to scar the visceral pleura and allow for lung to be trapped.  It is likely that the effusion will reaccumulate.  Follow-up in a few weeks to review next steps.   Current Outpatient Medications:    acetaminophen (TYLENOL) 500 MG tablet, Take 500 mg by mouth every 6 (six) hours as needed for mild pain or headache., Disp: , Rfl:    albuterol (PROVENTIL HFA;VENTOLIN HFA) 108 (90 Base) MCG/ACT inhaler, Inhale 1-2 puffs into the lungs every 6 (six) hours as needed for wheezing or shortness of breath., Disp: 1 Inhaler, Rfl: 6   albuterol (VENTOLIN HFA) 108 (90 Base) MCG/ACT inhaler, INHALE 2 PUFFS BY MOUTH INTO THE LUNGS EVERY 6 HOURS AS NEEDED, Disp: 18 g, Rfl: 0   amLODipine (NORVASC) 5 MG tablet, TAKE 2  TABLETS BY MOUTH DAILY, Disp: 180 tablet, Rfl: 2   apixaban (ELIQUIS) 5 MG TABS tablet, TAKE 1 TABLET BY MOUTH 2 TIMES DAILY, Disp: 60 tablet, Rfl: 5   atorvastatin (LIPITOR) 40 MG tablet, TAKE 1 TABLET BY MOUTH DAILY., Disp: 90 tablet, Rfl: 1   cloNIDine (CATAPRES) 0.1 MG tablet, TAKE 1 TABLET BY MOUTH 2 TIMES DAILY, Disp: 180 tablet, Rfl: 3   famotidine (PEPCID) 40 MG tablet, Take 1 tablet (40 mg total) by mouth daily. (Patient taking differently: Take 40 mg by mouth as needed for heartburn or indigestion.), Disp: 30 tablet, Rfl: 2   fluticasone (FLOVENT HFA) 110 MCG/ACT inhaler, Inhale 2 puffs into the lungs 2 (two) times daily., Disp: 1 Inhaler, Rfl: 0   lisinopril (ZESTRIL) 2.5 MG tablet, TAKE 1 TABLET BY MOUTH DAILY., Disp: 90 tablet, Rfl: 1   Menthol-Methyl Salicylate (MUSCLE RUB) 10-15 % CREA, Apply 1 application topically 3 (three) times daily with meals. To BLE, Disp: 85 g, Rfl: 0   metoCLOPramide (REGLAN) 5 MG tablet, TAKE 1 TABLET BY MOUTH AT BEDTIME., Disp: 30 tablet, Rfl: 5   metoprolol succinate (TOPROL-XL) 25 MG 24 hr tablet, TAKE 1/2 TABLET BY MOUTH DAILY, Disp: 45 tablet, Rfl: 3   minoxidil (LONITEN) 10 MG tablet, TAKE 1 TABLET BY MOUTH TWICE DAILY, Disp: 180 tablet, Rfl: 3   ondansetron (ZOFRAN ODT) 8 MG disintegrating tablet, Take 1 tablet (8 mg total) by mouth every 8 (eight) hours as needed for nausea or vomiting., Disp: 20 tablet, Rfl: 0   pantoprazole (PROTONIX) 40 MG tablet, TAKE 1 TABLET BY MOUTH ONCE A DAY BEFORE BREAKFAST, Disp: 90 tablet, Rfl: 3   polyethylene glycol (MIRALAX / GLYCOLAX) 17 g packet, Take 17 g by mouth 2 (two) times daily., Disp: 28 packet, Rfl: 0   polyvinyl alcohol (LIQUIFILM TEARS) 1.4 % ophthalmic solution, Place 1 drop into both eyes as needed for dry eyes. , Disp: , Rfl:    predniSONE (DELTASONE) 20 MG tablet, Take 2 tablets by mouth once daily for 5 days,  Disp: 10 tablet, Rfl: 0   sucralfate (CARAFATE) 1 g tablet, TAKE 1 TABLET BY MOUTH 4 TIMES  DAILY WITH MEALS AND AT BEDTIME, Disp: 90 tablet, Rfl: 0   traMADol (ULTRAM) 50 MG tablet, TAKE 1 TABLET BY MOUTH 3 TIMES DAILY AS NEEDED FOR MODERATE PAIN, Disp: 90 tablet, Rfl: 0   triamcinolone cream (KENALOG) 0.5 %, Apply 1 application topically daily as needed (for skin irritation). , Disp: , Rfl:    Vitamin D, Ergocalciferol, (DRISDOL) 1.25 MG (50000 UNIT) CAPS capsule, TAKE 1 CAPSULE BY MOUTH EVERY 7 DAYS, Disp: 12 capsule, Rfl: 0   ferrous sulfate (FERROUSUL) 325 (65 FE) MG tablet, Take 1 tablet (325 mg total) by mouth 3 (three) times daily with meals for 14 days., Disp: 90 tablet, Rfl: 2   gabapentin (NEURONTIN) 300 MG capsule, TAKE 1 CAPSULE BY MOUTH 3 TIMES DAILY, Disp: 270 capsule, Rfl: 1  I spent 62 minutes dedicated to the care of this patient on the date of this encounter to include pre-visit review of records, face-to-face time with the patient discussing conditions above, post visit ordering of testing, clinical documentation with the electronic health record, making appropriate referrals as documented, and communicating necessary findings to members of the patients care team.   [name], [facility] 07/18/2020 6:11 PM

## 2020-07-19 ENCOUNTER — Ambulatory Visit (INDEPENDENT_AMBULATORY_CARE_PROVIDER_SITE_OTHER): Payer: BC Managed Care – PPO

## 2020-07-19 DIAGNOSIS — J90 Pleural effusion, not elsewhere classified: Secondary | ICD-10-CM

## 2020-07-19 LAB — DRUG MONITORING, PANEL 8 WITH CONFIRMATION, URINE
6 Acetylmorphine: NEGATIVE ng/mL (ref <10)
Alcohol Metabolites: NEGATIVE ng/mL (ref <500)
Amphetamines: NEGATIVE ng/mL (ref <500)
Benzodiazepines: NEGATIVE ng/mL (ref <100)
Buprenorphine, Urine: NEGATIVE ng/mL (ref <5)
Cocaine Metabolite: NEGATIVE ng/mL (ref <150)
Creatinine: 130.1 mg/dL (ref >=20.0)
MDMA: NEGATIVE ng/mL (ref <500)
Marijuana Metabolite: NEGATIVE ng/mL (ref <20)
Opiates: NEGATIVE ng/mL (ref <100)
Oxidant: NEGATIVE ug/mL (ref <200)
Oxycodone: NEGATIVE ng/mL (ref <100)
pH: 5.6 (ref 4.5–9.0)

## 2020-07-19 LAB — DM TEMPLATE

## 2020-07-19 LAB — PATHOLOGIST SMEAR REVIEW: Path Review: INCREASED

## 2020-07-19 LAB — CYTOLOGY - NON PAP

## 2020-07-20 LAB — GLUCOSE, BODY FLUID OTHER: Glucose, Body Fluid Other: 116 mg/dL

## 2020-07-22 LAB — BODY FLUID CULTURE W GRAM STAIN: Culture: NO GROWTH

## 2020-07-24 ENCOUNTER — Telehealth: Payer: Self-pay | Admitting: Pulmonary Disease

## 2020-07-24 NOTE — Telephone Encounter (Signed)
[name] calling to get the results of the fluid that was pulled off on 07/07.  BI please advise. Thanks

## 2020-07-24 NOTE — Progress Notes (Signed)
Please let patient know that his effusion has chronic inflammatory cells present.  He has a follow-up appointment already scheduled with Tammy Parrett.  Need to make sure his pleural effusion does not recur.  If it reoccurs will need to consider repeat drainage of the effusion as a recurrent lymphocyte predominant effusion could mean concern for underlying malignancy.  Thanks,  BLI  Scott Nash, DO [facility] 07/24/2020 4:59 PM

## 2020-07-25 ENCOUNTER — Other Ambulatory Visit: Payer: Self-pay | Admitting: Family Medicine

## 2020-07-25 ENCOUNTER — Other Ambulatory Visit (HOSPITAL_COMMUNITY): Payer: Self-pay

## 2020-07-25 MED ORDER — TRAMADOL HCL 50 MG PO TABS
ORAL_TABLET | ORAL | 0 refills | Status: DC
  Filled 2020-07-25: qty 90, 30d supply, fill #0

## 2020-07-25 NOTE — Telephone Encounter (Signed)
Please see result note from yesterday  Thanks   [place], DO  07/24/2020  5:05 PM EDT Back to Top     Please let patient know that his effusion has chronic inflammatory cellspresent.  He has a follow-up appointment already scheduled with Tammy Parrett.   Need to make sure his pleural effusion does not recur.  If it reoccurs will need to consider repeat drainage of the effusion as a recurrent lymphocytepredominant effusion could mean concern for underlying malignancy.   Thanks,   BLI   [name], DO [facility] 07/24/2020 4:59 PM     Called [name] and there was no answer and unfortunately her VM was full, will call back.

## 2020-07-25 NOTE — Telephone Encounter (Signed)
Requesting: tramadol 50mg   Contract: 07/16/2020 UDS: 07/16/2020 Last Visit: 03/05/2020 w/ Percell Miller Next Visit: 10/28/2020 Last Refill: 06/28/2020 #90 and 0RF  Please Advise

## 2020-07-25 NOTE — Telephone Encounter (Signed)
[name], Patient's wife (DPR),returned call to office. Dr. Juline Patch recommendations given.  Understanding stated. Nothing further at this time.

## 2020-07-25 NOTE — Progress Notes (Signed)
[name], if this gentlemen's effusion recurs and may need to consider tapping a getting in.  Seeing you in follow-up in a few weeks.  Lymphocyte predominant effusion and an 22+-year-old.  Concern for potential underlying malignancy.  Cytology was negative.  Thanks,  [name]  [name], DO [facility] 07/25/2020 4:38 PM

## 2020-07-26 ENCOUNTER — Other Ambulatory Visit (HOSPITAL_COMMUNITY): Payer: Self-pay

## 2020-07-27 ENCOUNTER — Other Ambulatory Visit (HOSPITAL_COMMUNITY): Payer: Self-pay

## 2020-07-29 ENCOUNTER — Telehealth: Payer: Self-pay | Admitting: Family Medicine

## 2020-07-29 NOTE — Telephone Encounter (Signed)
Pt wife, [name], calling because she has called [facility] 3 x and no one has called her back. Would like this office to see if we can get him scheduled foer his thyroid nodule.

## 2020-07-30 NOTE — Telephone Encounter (Signed)
Patient calling to check the status of the referral. She would like an answer

## 2020-08-03 ENCOUNTER — Other Ambulatory Visit: Payer: Self-pay | Admitting: Family Medicine

## 2020-08-03 ENCOUNTER — Other Ambulatory Visit (HOSPITAL_COMMUNITY): Payer: Self-pay

## 2020-08-05 ENCOUNTER — Other Ambulatory Visit (HOSPITAL_COMMUNITY): Payer: Self-pay

## 2020-08-05 MED ORDER — LISINOPRIL 2.5 MG PO TABS
ORAL_TABLET | Freq: Every day | ORAL | 1 refills | Status: DC
  Filled 2020-08-05: qty 90, 90d supply, fill #0
  Filled 2020-11-07: qty 90, 90d supply, fill #1

## 2020-08-05 MED FILL — Apixaban Tab 5 MG: ORAL | 30 days supply | Qty: 60 | Fill #3 | Status: AC

## 2020-08-06 ENCOUNTER — Other Ambulatory Visit (HOSPITAL_COMMUNITY): Payer: Self-pay

## 2020-08-06 ENCOUNTER — Other Ambulatory Visit: Payer: Self-pay | Admitting: *Deleted

## 2020-08-06 MED ORDER — FAMOTIDINE 40 MG PO TABS
40.0000 mg | ORAL_TABLET | Freq: Every day | ORAL | 2 refills | Status: DC
  Filled 2020-08-06: qty 30, 30d supply, fill #0

## 2020-08-07 ENCOUNTER — Telehealth: Payer: Self-pay | Admitting: Pulmonary Disease

## 2020-08-07 NOTE — Telephone Encounter (Signed)
Scarlet is returning phone call. Scarlet phone number is [phone].

## 2020-08-07 NOTE — Telephone Encounter (Signed)
Pt has an upcoming appt with TP 08/12/20.  Attempted to call pt's daughter Scarlet but unable to reach. Left message for her to return call.

## 2020-08-07 NOTE — Telephone Encounter (Signed)
Called and spoke with pts daughter [name] and she stated that she normally comes to the Time with her dad but she is not going to be here on the appt 8/1.  She stated that the visit last time with BI they almost got 2 liters off of him.  She stated that BI was concerned of where this fluid is coming from and why it keeps coming back.  She wanted to make sure that a CXR will be done to check to see how the fluid is. She stated that he feels that some of the fluid is back.  Will route to TP to make her aware.

## 2020-08-09 ENCOUNTER — Telehealth: Payer: Self-pay

## 2020-08-09 NOTE — Telephone Encounter (Signed)
[name] called in states that she would like her husband's appointment switched from [place] to [facility] to have thyroid nodule removed.

## 2020-08-12 ENCOUNTER — Other Ambulatory Visit: Payer: Self-pay

## 2020-08-12 ENCOUNTER — Encounter: Payer: Self-pay | Admitting: Adult Health

## 2020-08-12 ENCOUNTER — Telehealth: Payer: Self-pay | Admitting: Internal Medicine

## 2020-08-12 ENCOUNTER — Ambulatory Visit (INDEPENDENT_AMBULATORY_CARE_PROVIDER_SITE_OTHER): Payer: BC Managed Care – PPO | Admitting: Adult Health

## 2020-08-12 ENCOUNTER — Other Ambulatory Visit (HOSPITAL_COMMUNITY): Payer: Self-pay

## 2020-08-12 ENCOUNTER — Ambulatory Visit (INDEPENDENT_AMBULATORY_CARE_PROVIDER_SITE_OTHER): Payer: BC Managed Care – PPO

## 2020-08-12 VITALS — BP 120/60 | HR 61 | Temp 98.3°F | Ht 70.0 in | Wt 146.2 lb

## 2020-08-12 DIAGNOSIS — J90 Pleural effusion, not elsewhere classified: Secondary | ICD-10-CM

## 2020-08-12 DIAGNOSIS — I5032 Chronic diastolic (congestive) heart failure: Secondary | ICD-10-CM | POA: Diagnosis not present

## 2020-08-12 MED ORDER — FUROSEMIDE 20 MG PO TABS
20.0000 mg | ORAL_TABLET | Freq: Every day | ORAL | 0 refills | Status: DC
  Filled 2020-08-12: qty 3, 3d supply, fill #0

## 2020-08-12 NOTE — Assessment & Plan Note (Signed)
Recurrent left pleural effusion-questionable etiology.  Previous thoracentesis revealed cytology negative for malignant cells.  Positive lymphocytosis.  Pleural cultures were negative. Patient's effusion has returned and reaccumulated.  Will need a repeat thoracentesis.  Patient is on Eliquis.  We will need to hold his Eliquis for 48 hours.  Patient has been set up for a ultrasound-guided thoracentesis to be completed on August 4 at 2 PM. Went over risk and benefits with patient and wife. May have a component of some mild volume overload.  We will give gentle diuresis for the next 3 days with Lasix low-dose at 20 mg.  Check labs today including BNP and be met.   Plan  Patient Instructions  Begin Lasix 59m daily for 3 days .  Labs today .  Set up for Thoracentesis , left pleural effusion - send fluid for cytology and culture . Hold Eliquis 48hr prior to procedure. [facility] at 1:45 at admissions to check in .   Follow up in office in 2-3 weeks with Dr. IValeta Harmsand As needed   Please contact office for sooner follow up if symptoms do not improve or worsen or seek emergency care

## 2020-08-12 NOTE — Telephone Encounter (Signed)
Lvm with information

## 2020-08-12 NOTE — Patient Instructions (Addendum)
Begin Lasix 20mg  daily for 3 days .  Labs today .  Set up for Thoracentesis , left pleural effusion - send fluid for cytology and culture . Hold Eliquis 48hr prior to procedure. Set up for 08/15/20 at 2:00 pm at [facility] , arrive at 1:45 at admissions to check in .   Follow up in office in 2-3 weeks with Dr. Valeta Harms and As needed   Please contact office for sooner follow up if symptoms do not improve or worsen or seek emergency care

## 2020-08-12 NOTE — Assessment & Plan Note (Signed)
Appears mildly decompensated.   Lasix x3 days, labs today  Plan  Patient Instructions  Begin Lasix 20mg  daily for 3 days .  Labs today .  Set up for Thoracentesis , left pleural effusion - send fluid for cytology and culture . Hold Eliquis 48hr prior to procedure. Set up for 08/15/20 at 2:00 pm at [facility] , arrive at 1:45 at admissions to check in .   Follow up in office in 2-3 weeks with Dr. Valeta Harms and As needed   Please contact office for sooner follow up if symptoms do not improve or worsen or seek emergency care

## 2020-08-12 NOTE — Progress Notes (Signed)
_0  ID: [name], male    DOB: 1929-06-14, 87 y.o.   MRN: [id number]  Chief Complaint  Patient presents with   Follow-up    Referring provider: [name], MD  HPI: 87 year old male former smoker followed for pleural effusion Medical history significant for carotid disease, dyslipidemia and previous prostate cancer. , A. fib on Eliquis  TEST/EVENTS :   08/12/2020 Follow up : Pleural Effusion  Patient presents for a 1 month follow-up.  Patient was seen last visit for ongoing cough and shortness of breath, CT revealed a left-sided pleural effusion.  Patient underwent thoracentesis on July 18, 2020.  Cytology was negative for malignant cells, showed lymphocytosis.  Cultures were negative.  Patient says he was doing well for couple weeks however over the last week he has noticed that his breathing has been getting worse.  He has no shortness of breath at rest but when he tries to do any type of walking he gets short of breath and has decreased activity tolerance.  He has no orthopnea.  He also has no cough.  He previously had a cough initially but this has resolved.  He denies any hemoptysis or weight loss over the last year. He denies any heartburn indigestion or trouble swallowing.  He does have nausea and decreased appetite especially worse first thing in the morning. Chest x-ray was reviewed independently today and shows return of moderate to large left pleural effusion He also complains of increased lower extremity swelling.    Allergies  Allergen Reactions   Hctz [Hydrochlorothiazide] Shortness Of Breath, Swelling and Other (See Comments)    "Swelling and dyspnea"    Hydralazine Shortness Of Breath and Other (See Comments)    Chest pain and GI issues also   Diovan [Valsartan] Other (See Comments)    Elevated potassium- Hyperkalemia   Metformin And Related Other (See Comments)    Reaction not recalled   Nsaids Other (See Comments)    Other than Tylenol, he isn't to  have these because he's on Eliquis   Other Other (See Comments)    Unnamed topically-applied B/P patch = Caused redness   Prednisone Other (See Comments)    Suicidal thoughts   Spironolactone Swelling    Site of swelling not recalled   Codeine Itching and Nausea Only   Oxycodone-Acetaminophen Itching and Nausea Only    Immunization History  Administered Date(s) Administered   Fluad Quad(high Dose 65+) 10/17/2018   Influenza Split 09/12/2012   Influenza Whole 11/04/2006, 10/11/2008, 09/24/2011   Influenza, High Dose Seasonal PF 10/08/2015, 11/02/2017   Influenza-Unspecified 10/05/2013, 10/13/2014   PFIZER(Purple Top)SARS-COV-2 Vaccination 02/05/2019, 02/27/2019   Pneumococcal Conjugate-13 10/05/2013   Pneumococcal Polysaccharide-23 01/13/2003, 12/10/2014   Tetanus 12/12/2013   Zoster, Live 11/24/2011    Past Medical History:  Diagnosis Date   Acute bronchitis 06/10/2015   Allergic rhinitis    Anemia    Asthma    Carotid stenosis    a. s/p Left CEA 2002;  b. Carotid US 3/16:  patent R CEA, L < 40%   Chronic pain syndrome    Left shoulder, back   Diverticulosis    Dyslipidemia    Epigastric pain 04/05/2016   Esophageal stricture    Distal, benign   GERD (gastroesophageal reflux disease)    Hemorrhoids    HTN (hypertension)    Negative renal duplex 07-22-11   Hx of echocardiogram    a. Echo 10/11: mod LVH, EF 55-60%   Prostate cancer ([place]) 2000  Seed XRT   Spondylosis, cervical, with myelopathy 11/26/2015   Stroke ([place]) 8/01   right thalamic - on chronic Plavix/ASA    Tobacco History: Social History   Tobacco Use  Smoking Status Former   Packs/day: 0.25   Years: 35.00   Pack years: 8.75   Types: Cigarettes   Start date: 71   Quit date: 01/12/1978   Years since quitting: 42.6  Smokeless Tobacco Current   Types: Chew  Tobacco Comments   Started smoking at age 26   Ready to quit: Not Answered Counseling given: Not Answered Tobacco comments: Started  smoking at age 35   Outpatient Medications Prior to Visit  Medication Sig Dispense Refill   acetaminophen (TYLENOL) 500 MG tablet Take 500 mg by mouth every 6 (six) hours as needed for mild pain or headache.     albuterol (PROVENTIL HFA;VENTOLIN HFA) 108 (90 Base) MCG/ACT inhaler Inhale 1-2 puffs into the lungs every 6 (six) hours as needed for wheezing or shortness of breath. 1 Inhaler 6   albuterol (VENTOLIN HFA) 108 (90 Base) MCG/ACT inhaler INHALE 2 PUFFS BY MOUTH INTO THE LUNGS EVERY 6 HOURS AS NEEDED 18 g 0   amLODipine (NORVASC) 5 MG tablet TAKE 2 TABLETS BY MOUTH DAILY 180 tablet 2   apixaban (ELIQUIS) 5 MG TABS tablet TAKE 1 TABLET BY MOUTH 2 TIMES DAILY 60 tablet 5   atorvastatin (LIPITOR) 40 MG tablet TAKE 1 TABLET BY MOUTH DAILY. 90 tablet 1   cloNIDine (CATAPRES) 0.1 MG tablet TAKE 1 TABLET BY MOUTH 2 TIMES DAILY 180 tablet 3   famotidine (PEPCID) 40 MG tablet Take 1 tablet (40 mg total) by mouth daily. 30 tablet 2   fluticasone (FLOVENT HFA) 110 MCG/ACT inhaler Inhale 2 puffs into the lungs 2 (two) times daily. 1 Inhaler 0   lisinopril (ZESTRIL) 2.5 MG tablet TAKE 1 TABLET BY MOUTH DAILY. 90 tablet 1   Menthol-Methyl Salicylate (MUSCLE RUB) 10-15 % CREA Apply 1 application topically 3 (three) times daily with meals. To BLE 85 g 0   metoCLOPramide (REGLAN) 5 MG tablet TAKE 1 TABLET BY MOUTH AT BEDTIME. 30 tablet 5   metoprolol succinate (TOPROL-XL) 25 MG 24 hr tablet TAKE 1/2 TABLET BY MOUTH DAILY 45 tablet 3   minoxidil (LONITEN) 10 MG tablet TAKE 1 TABLET BY MOUTH TWICE DAILY 180 tablet 3   ondansetron (ZOFRAN ODT) 8 MG disintegrating tablet Take 1 tablet (8 mg total) by mouth every 8 (eight) hours as needed for nausea or vomiting. 20 tablet 0   pantoprazole (PROTONIX) 40 MG tablet TAKE 1 TABLET BY MOUTH ONCE A DAY BEFORE BREAKFAST 90 tablet 3   polyethylene glycol (MIRALAX / GLYCOLAX) 17 g packet Take 17 g by mouth 2 (two) times daily. 28 packet 0   polyvinyl alcohol (LIQUIFILM  TEARS) 1.4 % ophthalmic solution Place 1 drop into both eyes as needed for dry eyes.      predniSONE (DELTASONE) 20 MG tablet Take 2 tablets by mouth once daily for 5 days 10 tablet 0   sucralfate (CARAFATE) 1 g tablet TAKE 1 TABLET BY MOUTH 4 TIMES DAILY WITH MEALS AND AT BEDTIME 90 tablet 0   traMADol (ULTRAM) 50 MG tablet TAKE 1 TABLET BY MOUTH 3 TIMES DAILY AS NEEDED FOR MODERATE PAIN 90 tablet 0   triamcinolone cream (KENALOG) 0.5 % Apply 1 application topically daily as needed (for skin irritation).      Vitamin D, Ergocalciferol, (DRISDOL) 1.25 MG (50000 UNIT) CAPS capsule TAKE  1 CAPSULE BY MOUTH EVERY 7 DAYS 12 capsule 0   ferrous sulfate (FERROUSUL) 325 (65 FE) MG tablet Take 1 tablet (325 mg total) by mouth 3 (three) times daily with meals for 14 days. 90 tablet 2   gabapentin (NEURONTIN) 300 MG capsule TAKE 1 CAPSULE BY MOUTH 3 TIMES DAILY 270 capsule 1   No facility-administered medications prior to visit.     Review of Systems:   Constitutional:   No  weight loss, night sweats,  Fevers, chills, + fatigue, or  lassitude.  HEENT:   No headaches,  Difficulty swallowing,  Tooth/dental problems, or  Sore throat,                No sneezing, itching, ear ache, nasal congestion, post nasal drip,   CV:  No chest pain,  Orthopnea, PND, swelling in lower extremities, anasarca, dizziness, palpitations, syncope.   GI  No heartburn, indigestion, abdominal pain, nausea, vomiting, diarrhea, change in bowel habits, loss of appetite, bloody stools.   Resp: .  No chest wall deformity  Skin: no rash or lesions.  GU: no dysuria, change in color of urine, no urgency or frequency.  No flank pain, no hematuria   MS:  No joint pain or swelling.  No decreased range of motion.  No back pain.    Physical Exam  BP 120/60 (BP Location: Left Arm, Patient Position: Sitting, Cuff Size: Normal)   Pulse 61   Temp 98.3 F (36.8 C) (Oral)   Ht _0  (1.778 m)   Wt 146 lb 3.2 oz (66.3 kg)   SpO2  97%   BMI 20.98 kg/m   GEN: A/Ox3; pleasant , NAD, well nourished    HEENT:  [place]/AT,  EACs-clear, TMs-wnl, NOSE-clear, THROAT-clear, no lesions, no postnasal drip or exudate noted.   NECK:  Supple w/ fair ROM; no JVD; normal carotid impulses w/o bruits; no thyromegaly or nodules palpated; no lymphadenopathy.    RESP decreased breath sounds on the left base  no accessory muscle use, no dullness to percussion  CARD:  RRR, no m/r/g, 1+  peripheral edema, pulses intact, no cyanosis or clubbing.  GI:   Soft & nt; nml bowel sounds; no organomegaly or masses detected.   Musco: Warm bil, no deformities or joint swelling noted.   Neuro: alert, no focal deficits noted.    Skin: Warm, no lesions or rashes    Lab Results:  CBC    Component Value Date/Time   WBC 8.6 07/16/2020 1659   RBC 3.83 (L) 07/16/2020 1659   HGB 12.1 (L) 07/16/2020 1659   HGB 11.3 (L) 11/23/2019 1209   HCT 35.1 (L) 07/16/2020 1659   HCT 34.7 (L) 11/23/2019 1209   PLT 286.0 07/16/2020 1659   PLT 326 11/23/2019 1209   MCV 91.6 07/16/2020 1659   MCV 94 11/23/2019 1209   MCH 30.5 11/23/2019 1209   MCH 30.4 10/31/2018 0859   MCHC 34.4 07/16/2020 1659   RDW 15.0 07/16/2020 1659   RDW 13.6 11/23/2019 1209   LYMPHSABS 1.0 07/16/2020 1659   MONOABS 0.5 07/16/2020 1659   EOSABS 0.1 07/16/2020 1659   BASOSABS 0.1 07/16/2020 1659    BMET    Component Value Date/Time   NA 139 07/16/2020 1659   NA 141 11/23/2019 1209   K 4.4 07/16/2020 1659   CL 106 07/16/2020 1659   CO2 27 07/16/2020 1659   GLUCOSE 104 (H) 07/16/2020 1659   BUN 19 07/16/2020 1659   BUN 18 11/23/2019  1209   CREATININE 1.27 07/16/2020 1659   CREATININE 0.99 10/18/2014 1041   CALCIUM 8.8 07/16/2020 1659   GFRNONAA 58 (L) 11/23/2019 1209   GFRAA 67 11/23/2019 1209    BNP    Component Value Date/Time   BNP 35.9 09/02/2012 1706    ProBNP    Component Value Date/Time   PROBNP 848 (H) 05/11/2019 1300   PROBNP 148.0 (H) 05/25/2017 0904     Imaging: DG Chest 2 View  Result Date: 08/12/2020 CLINICAL DATA:  87 year old male with pleural effusion. EXAM: CHEST - 2 VIEW COMPARISON:  Multiple chest radiographs, most recent 07/19/2020. CT chest, 07/09/2020. FINDINGS: Left basilar consolidation obscuring the left cardiac border. Hyperinflated though clear right lung. Moderate volume left pleural effusion. No pneumothorax. Right thyroidectomy clips. Multilevel degenerative changes of the thoracic spine. No acute osseous abnormality IMPRESSION: 1. Moderate volume left pleural effusion with basilar consolidation, likely atelectasis though pneumonia could appear similar. 2. No pneumothorax. Electronically Signed   By: Michaelle Birks MD   On: 08/12/2020 15:13   DG Chest 2 View  Result Date: 07/20/2020 CLINICAL DATA:  Follow-up pleural effusion EXAM: CHEST - 2 VIEW COMPARISON:  July 18, 2020 FINDINGS: There is a small left apical pneumothorax measuring 1.5 cm. The air in the pleural space on the comparison study was located at the base with a hydropneumothorax. No air-fluid level is seen today. The left-sided pleural effusion remains small but is probably slightly larger in the interval. No right-sided pneumothorax identified. The right lung is clear. The cardiomediastinal silhouette is stable. Opacity under the left-sided effusion is likely atelectasis. IMPRESSION: 1. There is a small left apical pneumothorax measuring 1.5 cm. This likely represents the air seen in the pleural space on the July 18, 2020 study, redistributed into the apex. This is not thought to be new pleural air. 2. The small left pleural effusion is larger in the interval. 3. No other acute abnormalities. Electronically Signed   By: Dorise Bullion III M.D   On: 07/20/2020 12:59   DG Chest 2 View  Result Date: 07/18/2020 CLINICAL DATA:  Status post thoracentesis. EXAM: CHEST - 2 VIEW COMPARISON:  August 04, 2019. FINDINGS: Small left basilar hydropneumothorax ex vacuo is noted status post  thoracentesis. There is a decreased amount of left pleural effusion. IMPRESSION: Small left basilar hydropneumothorax ex vacuo is noted status post thoracentesis. Electronically Signed   By: Marijo Conception M.D.   On: 07/18/2020 12:30      No flowsheet data found.  No results found for: NITRICOXIDE      Assessment & Plan:   Pleural effusion Recurrent left pleural effusion-questionable etiology.  Previous thoracentesis revealed cytology negative for malignant cells.  Positive lymphocytosis.  Pleural cultures were negative. Patient's effusion has returned and reaccumulated.  Will need a repeat thoracentesis.  Patient is on Eliquis.  We will need to hold his Eliquis for 48 hours.  Patient has been set up for a ultrasound-guided thoracentesis to be completed on August 4 at 2 PM. Went over risk and benefits with patient and wife. May have a component of some mild volume overload.  We will give gentle diuresis for the next 3 days with Lasix low-dose at 20 mg.  Check labs today including BNP and be met.   Plan  Patient Instructions  Begin Lasix 73m daily for 3 days .  Labs today .  Set up for Thoracentesis , left pleural effusion - send fluid for cytology and culture . Hold Eliquis  48hr prior to procedure. Set up for 08/15/20 at 2:00 pm at [facility] , arrive at 1:45 at admissions to check in .   Follow up in office in 2-3 weeks with Dr. Valeta Harms and As needed   Please contact office for sooner follow up if symptoms do not improve or worsen or seek emergency care       Chronic diastolic congestive heart failure ([place]) Appears mildly decompensated.   Lasix x3 days, labs today  Plan  Patient Instructions  Begin Lasix 72m daily for 3 days .  Labs today .  Set up for Thoracentesis , left pleural effusion - send fluid for cytology and culture . Hold Eliquis 48hr prior to procedure. [facility]45 at [facility] to check in .   Follow up in  office in 2-3 weeks with Dr. IValeta Harmsand As needed   Please contact office for sooner follow up if symptoms do not improve or worsen or seek emergency care       I spent   41 minutes dedicated to the care of this patient on the date of this encounter to include pre-visit review of records, face-to-face time with the patient discussing conditions above, post visit ordering of testing, clinical documentation with the electronic health record, making appropriate referrals as documented, and communicating necessary findings to members of the patients care team.    TRexene Edison NP 08/12/2020

## 2020-08-12 NOTE — Telephone Encounter (Signed)
Left message for patient to call back  

## 2020-08-12 NOTE — H&P (View-Only) (Signed)
_0  ID: [name], male    DOB: 1929-06-14, 87 y.o.   MRN: [id number]  Chief Complaint  Patient presents with   Follow-up    Referring provider: [name], MD  HPI: 87 year old male former smoker followed for pleural effusion Medical history significant for carotid disease, dyslipidemia and previous prostate cancer. , A. fib on Eliquis  TEST/EVENTS :   08/12/2020 Follow up : Pleural Effusion  Patient presents for a 1 month follow-up.  Patient was seen last visit for ongoing cough and shortness of breath, CT revealed a left-sided pleural effusion.  Patient underwent thoracentesis on July 18, 2020.  Cytology was negative for malignant cells, showed lymphocytosis.  Cultures were negative.  Patient says he was doing well for couple weeks however over the last week he has noticed that his breathing has been getting worse.  He has no shortness of breath at rest but when he tries to do any type of walking he gets short of breath and has decreased activity tolerance.  He has no orthopnea.  He also has no cough.  He previously had a cough initially but this has resolved.  He denies any hemoptysis or weight loss over the last year. He denies any heartburn indigestion or trouble swallowing.  He does have nausea and decreased appetite especially worse first thing in the morning. Chest x-ray was reviewed independently today and shows return of moderate to large left pleural effusion He also complains of increased lower extremity swelling.    Allergies  Allergen Reactions   Hctz [Hydrochlorothiazide] Shortness Of Breath, Swelling and Other (See Comments)    "Swelling and dyspnea"    Hydralazine Shortness Of Breath and Other (See Comments)    Chest pain and GI issues also   Diovan [Valsartan] Other (See Comments)    Elevated potassium- Hyperkalemia   Metformin And Related Other (See Comments)    Reaction not recalled   Nsaids Other (See Comments)    Other than Tylenol, he isn't to  have these because he's on Eliquis   Other Other (See Comments)    Unnamed topically-applied B/P patch = Caused redness   Prednisone Other (See Comments)    Suicidal thoughts   Spironolactone Swelling    Site of swelling not recalled   Codeine Itching and Nausea Only   Oxycodone-Acetaminophen Itching and Nausea Only    Immunization History  Administered Date(s) Administered   Fluad Quad(high Dose 65+) 10/17/2018   Influenza Split 09/12/2012   Influenza Whole 11/04/2006, 10/11/2008, 09/24/2011   Influenza, High Dose Seasonal PF 10/08/2015, 11/02/2017   Influenza-Unspecified 10/05/2013, 10/13/2014   PFIZER(Purple Top)SARS-COV-2 Vaccination 02/05/2019, 02/27/2019   Pneumococcal Conjugate-13 10/05/2013   Pneumococcal Polysaccharide-23 01/13/2003, 12/10/2014   Tetanus 12/12/2013   Zoster, Live 11/24/2011    Past Medical History:  Diagnosis Date   Acute bronchitis 06/10/2015   Allergic rhinitis    Anemia    Asthma    Carotid stenosis    a. s/p Left CEA 2002;  b. Carotid US 3/16:  patent R CEA, L < 40%   Chronic pain syndrome    Left shoulder, back   Diverticulosis    Dyslipidemia    Epigastric pain 04/05/2016   Esophageal stricture    Distal, benign   GERD (gastroesophageal reflux disease)    Hemorrhoids    HTN (hypertension)    Negative renal duplex 07-22-11   Hx of echocardiogram    a. Echo 10/11: mod LVH, EF 55-60%   Prostate cancer ([place]) 2000  Seed XRT   Spondylosis, cervical, with myelopathy 11/26/2015   Stroke ([place]) 8/01   right thalamic - on chronic Plavix/ASA    Tobacco History: Social History   Tobacco Use  Smoking Status Former   Packs/day: 0.25   Years: 35.00   Pack years: 8.75   Types: Cigarettes   Start date: 71   Quit date: 01/12/1978   Years since quitting: 42.6  Smokeless Tobacco Current   Types: Chew  Tobacco Comments   Started smoking at age 26   Ready to quit: Not Answered Counseling given: Not Answered Tobacco comments: Started  smoking at age 35   Outpatient Medications Prior to Visit  Medication Sig Dispense Refill   acetaminophen (TYLENOL) 500 MG tablet Take 500 mg by mouth every 6 (six) hours as needed for mild pain or headache.     albuterol (PROVENTIL HFA;VENTOLIN HFA) 108 (90 Base) MCG/ACT inhaler Inhale 1-2 puffs into the lungs every 6 (six) hours as needed for wheezing or shortness of breath. 1 Inhaler 6   albuterol (VENTOLIN HFA) 108 (90 Base) MCG/ACT inhaler INHALE 2 PUFFS BY MOUTH INTO THE LUNGS EVERY 6 HOURS AS NEEDED 18 g 0   amLODipine (NORVASC) 5 MG tablet TAKE 2 TABLETS BY MOUTH DAILY 180 tablet 2   apixaban (ELIQUIS) 5 MG TABS tablet TAKE 1 TABLET BY MOUTH 2 TIMES DAILY 60 tablet 5   atorvastatin (LIPITOR) 40 MG tablet TAKE 1 TABLET BY MOUTH DAILY. 90 tablet 1   cloNIDine (CATAPRES) 0.1 MG tablet TAKE 1 TABLET BY MOUTH 2 TIMES DAILY 180 tablet 3   famotidine (PEPCID) 40 MG tablet Take 1 tablet (40 mg total) by mouth daily. 30 tablet 2   fluticasone (FLOVENT HFA) 110 MCG/ACT inhaler Inhale 2 puffs into the lungs 2 (two) times daily. 1 Inhaler 0   lisinopril (ZESTRIL) 2.5 MG tablet TAKE 1 TABLET BY MOUTH DAILY. 90 tablet 1   Menthol-Methyl Salicylate (MUSCLE RUB) 10-15 % CREA Apply 1 application topically 3 (three) times daily with meals. To BLE 85 g 0   metoCLOPramide (REGLAN) 5 MG tablet TAKE 1 TABLET BY MOUTH AT BEDTIME. 30 tablet 5   metoprolol succinate (TOPROL-XL) 25 MG 24 hr tablet TAKE 1/2 TABLET BY MOUTH DAILY 45 tablet 3   minoxidil (LONITEN) 10 MG tablet TAKE 1 TABLET BY MOUTH TWICE DAILY 180 tablet 3   ondansetron (ZOFRAN ODT) 8 MG disintegrating tablet Take 1 tablet (8 mg total) by mouth every 8 (eight) hours as needed for nausea or vomiting. 20 tablet 0   pantoprazole (PROTONIX) 40 MG tablet TAKE 1 TABLET BY MOUTH ONCE A DAY BEFORE BREAKFAST 90 tablet 3   polyethylene glycol (MIRALAX / GLYCOLAX) 17 g packet Take 17 g by mouth 2 (two) times daily. 28 packet 0   polyvinyl alcohol (LIQUIFILM  TEARS) 1.4 % ophthalmic solution Place 1 drop into both eyes as needed for dry eyes.      predniSONE (DELTASONE) 20 MG tablet Take 2 tablets by mouth once daily for 5 days 10 tablet 0   sucralfate (CARAFATE) 1 g tablet TAKE 1 TABLET BY MOUTH 4 TIMES DAILY WITH MEALS AND AT BEDTIME 90 tablet 0   traMADol (ULTRAM) 50 MG tablet TAKE 1 TABLET BY MOUTH 3 TIMES DAILY AS NEEDED FOR MODERATE PAIN 90 tablet 0   triamcinolone cream (KENALOG) 0.5 % Apply 1 application topically daily as needed (for skin irritation).      Vitamin D, Ergocalciferol, (DRISDOL) 1.25 MG (50000 UNIT) CAPS capsule TAKE  1 CAPSULE BY MOUTH EVERY 7 DAYS 12 capsule 0   ferrous sulfate (FERROUSUL) 325 (65 FE) MG tablet Take 1 tablet (325 mg total) by mouth 3 (three) times daily with meals for 14 days. 90 tablet 2   gabapentin (NEURONTIN) 300 MG capsule TAKE 1 CAPSULE BY MOUTH 3 TIMES DAILY 270 capsule 1   No facility-administered medications prior to visit.     Review of Systems:   Constitutional:   No  weight loss, night sweats,  Fevers, chills, + fatigue, or  lassitude.  HEENT:   No headaches,  Difficulty swallowing,  Tooth/dental problems, or  Sore throat,                No sneezing, itching, ear ache, nasal congestion, post nasal drip,   CV:  No chest pain,  Orthopnea, PND, swelling in lower extremities, anasarca, dizziness, palpitations, syncope.   GI  No heartburn, indigestion, abdominal pain, nausea, vomiting, diarrhea, change in bowel habits, loss of appetite, bloody stools.   Resp: .  No chest wall deformity  Skin: no rash or lesions.  GU: no dysuria, change in color of urine, no urgency or frequency.  No flank pain, no hematuria   MS:  No joint pain or swelling.  No decreased range of motion.  No back pain.    Physical Exam  BP 120/60 (BP Location: Left Arm, Patient Position: Sitting, Cuff Size: Normal)   Pulse 61   Temp 98.3 F (36.8 C) (Oral)   Ht _0  (1.778 m)   Wt 146 lb 3.2 oz (66.3 kg)   SpO2  97%   BMI 20.98 kg/m   GEN: A/Ox3; pleasant , NAD, well nourished    HEENT:  [place]/AT,  EACs-clear, TMs-wnl, NOSE-clear, THROAT-clear, no lesions, no postnasal drip or exudate noted.   NECK:  Supple w/ fair ROM; no JVD; normal carotid impulses w/o bruits; no thyromegaly or nodules palpated; no lymphadenopathy.    RESP decreased breath sounds on the left base  no accessory muscle use, no dullness to percussion  CARD:  RRR, no m/r/g, 1+  peripheral edema, pulses intact, no cyanosis or clubbing.  GI:   Soft & nt; nml bowel sounds; no organomegaly or masses detected.   Musco: Warm bil, no deformities or joint swelling noted.   Neuro: alert, no focal deficits noted.    Skin: Warm, no lesions or rashes    Lab Results:  CBC    Component Value Date/Time   WBC 8.6 07/16/2020 1659   RBC 3.83 (L) 07/16/2020 1659   HGB 12.1 (L) 07/16/2020 1659   HGB 11.3 (L) 11/23/2019 1209   HCT 35.1 (L) 07/16/2020 1659   HCT 34.7 (L) 11/23/2019 1209   PLT 286.0 07/16/2020 1659   PLT 326 11/23/2019 1209   MCV 91.6 07/16/2020 1659   MCV 94 11/23/2019 1209   MCH 30.5 11/23/2019 1209   MCH 30.4 10/31/2018 0859   MCHC 34.4 07/16/2020 1659   RDW 15.0 07/16/2020 1659   RDW 13.6 11/23/2019 1209   LYMPHSABS 1.0 07/16/2020 1659   MONOABS 0.5 07/16/2020 1659   EOSABS 0.1 07/16/2020 1659   BASOSABS 0.1 07/16/2020 1659    BMET    Component Value Date/Time   NA 139 07/16/2020 1659   NA 141 11/23/2019 1209   K 4.4 07/16/2020 1659   CL 106 07/16/2020 1659   CO2 27 07/16/2020 1659   GLUCOSE 104 (H) 07/16/2020 1659   BUN 19 07/16/2020 1659   BUN 18 11/23/2019  1209   CREATININE 1.27 07/16/2020 1659   CREATININE 0.99 10/18/2014 1041   CALCIUM 8.8 07/16/2020 1659   GFRNONAA 58 (L) 11/23/2019 1209   GFRAA 67 11/23/2019 1209    BNP    Component Value Date/Time   BNP 35.9 09/02/2012 1706    ProBNP    Component Value Date/Time   PROBNP 848 (H) 05/11/2019 1300   PROBNP 148.0 (H) 05/25/2017 0904     Imaging: DG Chest 2 View  Result Date: 08/12/2020 CLINICAL DATA:  87 year old male with pleural effusion. EXAM: CHEST - 2 VIEW COMPARISON:  Multiple chest radiographs, most recent 07/19/2020. CT chest, 07/09/2020. FINDINGS: Left basilar consolidation obscuring the left cardiac border. Hyperinflated though clear right lung. Moderate volume left pleural effusion. No pneumothorax. Right thyroidectomy clips. Multilevel degenerative changes of the thoracic spine. No acute osseous abnormality IMPRESSION: 1. Moderate volume left pleural effusion with basilar consolidation, likely atelectasis though pneumonia could appear similar. 2. No pneumothorax. Electronically Signed   By: Michaelle Birks MD   On: 08/12/2020 15:13   DG Chest 2 View  Result Date: 07/20/2020 CLINICAL DATA:  Follow-up pleural effusion EXAM: CHEST - 2 VIEW COMPARISON:  July 18, 2020 FINDINGS: There is a small left apical pneumothorax measuring 1.5 cm. The air in the pleural space on the comparison study was located at the base with a hydropneumothorax. No air-fluid level is seen today. The left-sided pleural effusion remains small but is probably slightly larger in the interval. No right-sided pneumothorax identified. The right lung is clear. The cardiomediastinal silhouette is stable. Opacity under the left-sided effusion is likely atelectasis. IMPRESSION: 1. There is a small left apical pneumothorax measuring 1.5 cm. This likely represents the air seen in the pleural space on the July 18, 2020 study, redistributed into the apex. This is not thought to be new pleural air. 2. The small left pleural effusion is larger in the interval. 3. No other acute abnormalities. Electronically Signed   By: Dorise Bullion III M.D   On: 07/20/2020 12:59   DG Chest 2 View  Result Date: 07/18/2020 CLINICAL DATA:  Status post thoracentesis. EXAM: CHEST - 2 VIEW COMPARISON:  August 04, 2019. FINDINGS: Small left basilar hydropneumothorax ex vacuo is noted status post  thoracentesis. There is a decreased amount of left pleural effusion. IMPRESSION: Small left basilar hydropneumothorax ex vacuo is noted status post thoracentesis. Electronically Signed   By: Marijo Conception M.D.   On: 07/18/2020 12:30      No flowsheet data found.  No results found for: NITRICOXIDE      Assessment & Plan:   Pleural effusion Recurrent left pleural effusion-questionable etiology.  Previous thoracentesis revealed cytology negative for malignant cells.  Positive lymphocytosis.  Pleural cultures were negative. Patient's effusion has returned and reaccumulated.  Will need a repeat thoracentesis.  Patient is on Eliquis.  We will need to hold his Eliquis for 48 hours.  Patient has been set up for a ultrasound-guided thoracentesis to be completed on August 4 at 2 PM. Went over risk and benefits with patient and wife. May have a component of some mild volume overload.  We will give gentle diuresis for the next 3 days with Lasix low-dose at 20 mg.  Check labs today including BNP and be met.   Plan  Patient Instructions  Begin Lasix 73m daily for 3 days .  Labs today .  Set up for Thoracentesis , left pleural effusion - send fluid for cytology and culture . Hold Eliquis  48hr prior to procedure. Set up for 08/15/20 at 2:00 pm at [facility] , arrive at 1:45 at admissions to check in .   Follow up in office in 2-3 weeks with Dr. Valeta Harms and As needed   Please contact office for sooner follow up if symptoms do not improve or worsen or seek emergency care       Chronic diastolic congestive heart failure ([place]) Appears mildly decompensated.   Lasix x3 days, labs today  Plan  Patient Instructions  Begin Lasix 72m daily for 3 days .  Labs today .  Set up for Thoracentesis , left pleural effusion - send fluid for cytology and culture . Hold Eliquis 48hr prior to procedure. [facility] at 1:45 at admissions to check in .   Follow up in  office in 2-3 weeks with Dr. IValeta Harmsand As needed   Please contact office for sooner follow up if symptoms do not improve or worsen or seek emergency care       I spent   41 minutes dedicated to the care of this patient on the date of this encounter to include pre-visit review of records, face-to-face time with the patient discussing conditions above, post visit ordering of testing, clinical documentation with the electronic health record, making appropriate referrals as documented, and communicating necessary findings to members of the patients care team.    TRexene Edison NP 08/12/2020

## 2020-08-13 LAB — BASIC METABOLIC PANEL
BUN: 18 mg/dL (ref 6–23)
CO2: 28 mEq/L (ref 19–32)
Calcium: 8.8 mg/dL (ref 8.4–10.5)
Chloride: 106 mEq/L (ref 96–112)
Creatinine, Ser: 1.11 mg/dL (ref 0.40–1.50)
GFR: 59.09 mL/min — ABNORMAL LOW (ref >60.00)
Glucose, Bld: 99 mg/dL (ref 70–99)
Potassium: 4.9 mEq/L (ref 3.5–5.1)
Sodium: 140 mEq/L (ref 135–145)

## 2020-08-13 LAB — BRAIN NATRIURETIC PEPTIDE: Pro B Natriuretic peptide (BNP): 156 pg/mL — ABNORMAL HIGH (ref 0.0–100.0)

## 2020-08-13 NOTE — Telephone Encounter (Signed)
Mrs.Smeltz is aware of information

## 2020-08-13 NOTE — Telephone Encounter (Signed)
Patient's wife reports that patient has LUQ pain for approximately 2 weeks.  C/o "aches", pain is intermittent.  Has occasional nausea.  Pain is sometimes worse after eating or with ambulation. "Feels better when he is lying down".  Patient denies constipation or diarrhea.  He is taking a daily stool softener. Has some nausea and a decreased appetite. Patient is scheduled for repeat thoracentesis this Thursday for recurrent pleural effusion. Wife is requesting a CT scan.  Patient not seen since 11/21.  I have scheduled him for office visit with Dr. Carlean Purl on 08/19/20 2:10

## 2020-08-15 ENCOUNTER — Ambulatory Visit (HOSPITAL_COMMUNITY): Payer: PPO

## 2020-08-15 ENCOUNTER — Ambulatory Visit (HOSPITAL_COMMUNITY)
Admission: RE | Admit: 2020-08-15 | Discharge: 2020-08-15 | Disposition: A | Discharge status: Home / Self Care | Payer: PPO | Attending: Pulmonary Disease | Admitting: Pulmonary Disease

## 2020-08-15 ENCOUNTER — Telehealth: Payer: Self-pay | Admitting: Critical Care Medicine

## 2020-08-15 ENCOUNTER — Encounter (HOSPITAL_COMMUNITY): Payer: Self-pay | Admitting: Pulmonary Disease

## 2020-08-15 ENCOUNTER — Encounter (HOSPITAL_COMMUNITY)
Admission: RE | Disposition: A | Discharge status: Home / Self Care | Payer: Self-pay | Source: Home / Self Care | Attending: Pulmonary Disease

## 2020-08-15 DIAGNOSIS — Z7952 Long term (current) use of systemic steroids: Secondary | ICD-10-CM | POA: Insufficient documentation

## 2020-08-15 DIAGNOSIS — Z8546 Personal history of malignant neoplasm of prostate: Secondary | ICD-10-CM | POA: Diagnosis not present

## 2020-08-15 DIAGNOSIS — Z888 Allergy status to other drugs, medicaments and biological substances status: Secondary | ICD-10-CM | POA: Diagnosis not present

## 2020-08-15 DIAGNOSIS — J939 Pneumothorax, unspecified: Secondary | ICD-10-CM | POA: Diagnosis not present

## 2020-08-15 DIAGNOSIS — E785 Hyperlipidemia, unspecified: Secondary | ICD-10-CM | POA: Insufficient documentation

## 2020-08-15 DIAGNOSIS — I4891 Unspecified atrial fibrillation: Secondary | ICD-10-CM | POA: Diagnosis not present

## 2020-08-15 DIAGNOSIS — Z79899 Other long term (current) drug therapy: Secondary | ICD-10-CM | POA: Insufficient documentation

## 2020-08-15 DIAGNOSIS — Z87891 Personal history of nicotine dependence: Secondary | ICD-10-CM | POA: Diagnosis not present

## 2020-08-15 DIAGNOSIS — I5032 Chronic diastolic (congestive) heart failure: Secondary | ICD-10-CM | POA: Insufficient documentation

## 2020-08-15 DIAGNOSIS — Z7901 Long term (current) use of anticoagulants: Secondary | ICD-10-CM | POA: Diagnosis not present

## 2020-08-15 DIAGNOSIS — Z886 Allergy status to analgesic agent status: Secondary | ICD-10-CM | POA: Diagnosis not present

## 2020-08-15 DIAGNOSIS — Z9889 Other specified postprocedural states: Secondary | ICD-10-CM

## 2020-08-15 DIAGNOSIS — Z885 Allergy status to narcotic agent status: Secondary | ICD-10-CM | POA: Diagnosis not present

## 2020-08-15 DIAGNOSIS — I110 Hypertensive heart disease with heart failure: Secondary | ICD-10-CM | POA: Insufficient documentation

## 2020-08-15 DIAGNOSIS — J90 Pleural effusion, not elsewhere classified: Secondary | ICD-10-CM | POA: Diagnosis not present

## 2020-08-15 DIAGNOSIS — R091 Pleurisy: Secondary | ICD-10-CM | POA: Diagnosis not present

## 2020-08-15 HISTORY — PX: THORACENTESIS: SHX235

## 2020-08-15 LAB — GLUCOSE, PLEURAL OR PERITONEAL FLUID: Glucose, Fluid: 110 mg/dL

## 2020-08-15 LAB — ALBUMIN, PLEURAL OR PERITONEAL FLUID: Albumin, Fluid: 2.3 g/dL

## 2020-08-15 LAB — BODY FLUID CELL COUNT WITH DIFFERENTIAL
Eos, Fluid: 11 %
Lymphs, Fluid: 87 %
Monocyte-Macrophage-Serous Fluid: 1 % — ABNORMAL LOW (ref 50–90)
Neutrophil Count, Fluid: 1 % (ref 0–25)
Total Nucleated Cell Count, Fluid: 792 cu mm (ref 0–1000)

## 2020-08-15 LAB — PROTEIN, PLEURAL OR PERITONEAL FLUID: Total protein, fluid: 3.7 g/dL

## 2020-08-15 LAB — LACTATE DEHYDROGENASE, PLEURAL OR PERITONEAL FLUID: LD, Fluid: 158 U/L — ABNORMAL HIGH (ref 3–23)

## 2020-08-15 SURGERY — THORACENTESIS
Anesthesia: LOCAL

## 2020-08-15 NOTE — Interval H&P Note (Signed)
History and Physical Interval Note:  08/15/2020 4:28 PM  [name]  has presented today for surgery, with the diagnosis of pleural effusion.  The various methods of treatment have been discussed with the patient and family. After consideration of risks, benefits and other options for treatment, the patient has consented to  Procedure(s): THORACENTESIS (N/A) as a surgical intervention.  The patient's history has been reviewed, patient examined, no change in status, stable for surgery.  I have reviewed the patient's chart and labs.  Questions were answered to the patient's satisfaction.     [place]

## 2020-08-15 NOTE — Op Note (Signed)
Thoracentesis  Procedure Note  [name]  [id number]  Mar 20, 1929  Date:08/15/20  Time:5:29 PM   Provider Performing:[name]   Procedure: Thoracentesis with imaging guidance ([id number])  Indication(s) Pleural Effusion  Consent Risks of the procedure as well as the alternatives and risks of each were explained to the patient and/or caregiver.  Consent for the procedure was obtained and is signed in the bedside chart  Anesthesia Topical only with 1% lidocaine    Time Out Verified patient identification, verified procedure, site/side was marked, verified correct patient position, special equipment/implants available, medications/allergies/relevant history reviewed, required imaging and test results available.   Sterile Technique Maximal sterile technique including full sterile barrier drape, hand hygiene, sterile gown, sterile gloves, mask, hair covering, sterile ultrasound probe cover (if used).  Procedure Description Ultrasound was used to identify appropriate pleural anatomy for placement and overlying skin marked.  Area of drainage cleaned and draped in sterile fashion. Lidocaine was used to anesthetize the skin and subcutaneous tissue.  2000 cc's of amber fluid appearing fluid was drained from the left pleural space. Catheter then removed and bandaid applied to site.   Complications/Tolerance None; patient tolerated the procedure well. Chest X-ray is ordered to confirm no post-procedural complication.   EBL Minimal   Specimen(s) Pleural fluid   Scott Nash, DO [facility] 08/15/2020 5:30 PM    Left Pleural Fluid under US imaging:

## 2020-08-15 NOTE — Telephone Encounter (Signed)
Notified by elink of critical read on CXR with loculated left lower pneumothorax after large volume thoracentesis-- 2L removed today. Patient sent home after thoracentesis this evening after CXR reviewed- thought to be trapped lung since he had a similar loculated hydropneumothorax after thora in early July.  I called him to let him know about the CXR- he has a little soreness post thora but overall feels well and denies SOB. I spoke to his daughter to let her know that if he develops SOB overnight or tomorrow he should come to either the ER or the office to have a CXR performed, but this is probably a benign CXR finding in his case.  Julian Hy, DO 08/15/20 8:04 PM [facility]

## 2020-08-16 ENCOUNTER — Ambulatory Visit: Payer: BC Managed Care – PPO

## 2020-08-16 ENCOUNTER — Other Ambulatory Visit (HOSPITAL_COMMUNITY): Payer: Self-pay

## 2020-08-16 ENCOUNTER — Other Ambulatory Visit: Payer: Self-pay | Admitting: Family Medicine

## 2020-08-16 ENCOUNTER — Encounter (HOSPITAL_COMMUNITY): Payer: Self-pay | Admitting: Pulmonary Disease

## 2020-08-16 DIAGNOSIS — I6350 Cerebral infarction due to unspecified occlusion or stenosis of unspecified cerebral artery: Secondary | ICD-10-CM

## 2020-08-16 DIAGNOSIS — E782 Mixed hyperlipidemia: Secondary | ICD-10-CM

## 2020-08-16 DIAGNOSIS — I10 Essential (primary) hypertension: Secondary | ICD-10-CM

## 2020-08-16 DIAGNOSIS — E559 Vitamin D deficiency, unspecified: Secondary | ICD-10-CM

## 2020-08-16 LAB — TRIGLYCERIDES, BODY FLUIDS: Triglycerides, Fluid: 18 mg/dL

## 2020-08-16 LAB — AMYLASE, PLEURAL OR PERITONEAL FLUID: Amylase, Fluid: 37 U/L

## 2020-08-16 LAB — CYTOLOGY - NON PAP

## 2020-08-16 MED FILL — Pantoprazole Sodium EC Tab 40 MG (Base Equiv): ORAL | 90 days supply | Qty: 90 | Fill #1 | Status: AC

## 2020-08-16 NOTE — Progress Notes (Signed)
I called the patient and spoke with the wife on DPR and she voices understanding. No other concerns.

## 2020-08-16 NOTE — Telephone Encounter (Signed)
Called and checked on patient patient says he is doing well.  Has improved breathing.  Denies any chest pain or increased shortness of breath.  We will have him return to the office next week on August 9 for a chest x-ray only to make sure no worsening pneumothorax.  Otherwise he will keep his follow-up appointment with Dr. Valeta Harms on August 19.  Patient is aware if he develops acute changes symptoms he is to contact us immediately or seek emergency room care  Please contact office for sooner follow up if symptoms do not improve or worsen or seek emergency care

## 2020-08-18 LAB — PH, BODY FLUID: pH, Body Fluid: 7.8

## 2020-08-19 ENCOUNTER — Ambulatory Visit (INDEPENDENT_AMBULATORY_CARE_PROVIDER_SITE_OTHER): Payer: BC Managed Care – PPO | Admitting: Internal Medicine

## 2020-08-19 ENCOUNTER — Other Ambulatory Visit (HOSPITAL_COMMUNITY): Payer: Self-pay

## 2020-08-19 ENCOUNTER — Encounter: Payer: Self-pay | Admitting: Internal Medicine

## 2020-08-19 VITALS — BP 116/46 | HR 80 | Ht 67.0 in | Wt 144.4 lb

## 2020-08-19 DIAGNOSIS — K5909 Other constipation: Secondary | ICD-10-CM | POA: Diagnosis not present

## 2020-08-19 DIAGNOSIS — R110 Nausea: Secondary | ICD-10-CM | POA: Diagnosis not present

## 2020-08-19 DIAGNOSIS — R918 Other nonspecific abnormal finding of lung field: Secondary | ICD-10-CM

## 2020-08-19 DIAGNOSIS — R1012 Left upper quadrant pain: Secondary | ICD-10-CM | POA: Diagnosis not present

## 2020-08-19 DIAGNOSIS — R634 Abnormal weight loss: Secondary | ICD-10-CM | POA: Diagnosis not present

## 2020-08-19 DIAGNOSIS — J90 Pleural effusion, not elsewhere classified: Secondary | ICD-10-CM

## 2020-08-19 LAB — BODY FLUID CULTURE W GRAM STAIN: Culture: NO GROWTH

## 2020-08-19 LAB — CHOLESTEROL, BODY FLUID: Cholesterol, Fluid: 50 mg/dL

## 2020-08-19 MED ORDER — ATORVASTATIN CALCIUM 40 MG PO TABS
40.0000 mg | ORAL_TABLET | Freq: Every day | ORAL | 1 refills | Status: DC
  Filled 2020-08-19: qty 90, 90d supply, fill #0
  Filled 2020-11-18: qty 90, 90d supply, fill #1

## 2020-08-19 NOTE — Progress Notes (Signed)
[name] 87 y.o. [birth year] [id number]  Assessment & Plan:   Encounter Diagnoses  Name Primary?   Chronic nausea Yes   Loss of weight    LUQ pain    Chronic constipation    Recurrent left pleural effusion    Lung nodules    He had a CT scan of the abdomen and pelvis last year that did not show anything but 1 has to wonder about a malignancy in this patient.  His amylase was okay on the pleural fluid which is reassuring but I wonder about a CT of the abdomen pelvis versus PET scan perhaps.  I will review with Dr. Judithann Harmon and we will decide.  His kidney function is acceptable.  He and his wife are hoping to get to the beach next week which is not a problem as I do not think anything urgent is necessary here.  CC: Scott Lukes, MD Dr. Carney Corners   Communication with Dr. Carney Corners, we will get a PET scan.   Subjective:   Chief Complaint: Abdominal pain weight loss  HPI 87 year old white man here for follow-up with a history of chronic nausea and dyspepsia complaining of left upper quadrant pain nausea unintentional weight loss and constipation.  Last seen November 2021, and he had very similar symptoms.   Wt Readings from Last 3 Encounters:  08/19/20 144 lb 6 oz (65.5 kg)  08/12/20 146 lb 3.2 oz (66.3 kg)  07/18/20 148 lb (67.1 kg)  November 2021 he was 150 pounds   He has been seen in pulmonary recently had pneumonia and a CT of the chest in June on the 29th demonstrated a chronic large left pleural effusion that is increased.  He also had a thyroid nodule 2.2 cm. Ultrasound categorize it as moderately suspicious.  Patient had a thoracentesis on August 8 and a small loculated pneumothorax at the left lateral lung base and chronic left lower lobe consolidation but no residual left pleural effusion.  This was similar to findings in July.  Pleural fluid studies show a high LDH and protein and lymphocytic predominance.  Dr. Leroy Sea Icard was concerned about a  potential underlying malignancy though cytology was negative.  He is continuing to have problems with left upper quadrant pain and early satiety, mild weight loss as reflected above, and using stool softeners and laxative to help move his bowels.  This is really not a whole lot different than what he has had.  He does have some improvement particularly with his breathing when the pleural effusions are removed.  His wife had contacted me a couple of weeks ago wondering about him needing a CT scan. Allergies  Allergen Reactions   Hctz [Hydrochlorothiazide] Shortness Of Breath, Swelling and Other (See Comments)    "Swelling and dyspnea"    Hydralazine Shortness Of Breath and Other (See Comments)    Chest pain and GI issues also   Diovan [Valsartan] Other (See Comments)    Elevated potassium- Hyperkalemia   Metformin And Related Other (See Comments)    Reaction not recalled   Nsaids Other (See Comments)    Other than Tylenol, he isn't to have these because he's on Eliquis   Other Other (See Comments)    Unnamed topically-applied B/P patch = Caused redness   Prednisone Other (See Comments)    Suicidal thoughts   Spironolactone Swelling    Site of swelling not recalled   Codeine Itching and Nausea  Only   Oxycodone-Acetaminophen Itching and Nausea Only   Current Meds  Medication Sig   acetaminophen (TYLENOL) 500 MG tablet Take 500 mg by mouth every 6 (six) hours as needed for mild pain or headache.   albuterol (VENTOLIN HFA) 108 (90 Base) MCG/ACT inhaler INHALE 2 PUFFS BY MOUTH INTO THE LUNGS EVERY 6 HOURS AS NEEDED   amLODipine (NORVASC) 5 MG tablet TAKE 2 TABLETS BY MOUTH DAILY   apixaban (ELIQUIS) 5 MG TABS tablet TAKE 1 TABLET BY MOUTH 2 TIMES DAILY   atorvastatin (LIPITOR) 40 MG tablet Take 1 tablet by mouth daily.   cloNIDine (CATAPRES) 0.1 MG tablet TAKE 1 TABLET BY MOUTH 2 TIMES DAILY   famotidine (PEPCID) 40 MG tablet Take 1 tablet (40 mg total) by mouth daily.   ferrous sulfate  (FERROUSUL) 325 (65 FE) MG tablet Take 1 tablet (325 mg total) by mouth 3 (three) times daily with meals for 14 days. (Patient taking differently: Take 325 mg by mouth in the morning and at bedtime.)   fluticasone (FLOVENT HFA) 110 MCG/ACT inhaler Inhale 2 puffs into the lungs 2 (two) times daily.   lisinopril (ZESTRIL) 2.5 MG tablet TAKE 1 TABLET BY MOUTH DAILY.   Menthol-Methyl Salicylate (MUSCLE RUB) 10-15 % CREA Apply 1 application topically 3 (three) times daily with meals. To BLE   metoCLOPramide (REGLAN) 5 MG tablet TAKE 1 TABLET BY MOUTH AT BEDTIME.   metoprolol succinate (TOPROL-XL) 25 MG 24 hr tablet TAKE 1/2 TABLET BY MOUTH DAILY   minoxidil (LONITEN) 10 MG tablet TAKE 1 TABLET BY MOUTH TWICE DAILY   pantoprazole (PROTONIX) 40 MG tablet TAKE 1 TABLET BY MOUTH ONCE A DAY BEFORE BREAKFAST   polyvinyl alcohol (LIQUIFILM TEARS) 1.4 % ophthalmic solution Place 1 drop into both eyes as needed for dry eyes.    traMADol (ULTRAM) 50 MG tablet TAKE 1 TABLET BY MOUTH 3 TIMES DAILY AS NEEDED FOR MODERATE PAIN   triamcinolone cream (KENALOG) 0.5 % Apply 1 application topically daily as needed (for skin irritation).    Vitamin D, Ergocalciferol, (DRISDOL) 1.25 MG (50000 UNIT) CAPS capsule TAKE 1 CAPSULE BY MOUTH EVERY 7 DAYS   Past Medical History:  Diagnosis Date   Acute bronchitis 06/10/2015   Allergic rhinitis    Anemia    Asthma    Carotid stenosis    a. s/p Left CEA 2002;  b. Carotid US 3/16:  patent R CEA, L < 40%   Chronic pain syndrome    Left shoulder, back   Diverticulosis    Dyslipidemia    Epigastric pain 04/05/2016   Esophageal stricture    Distal, benign   GERD (gastroesophageal reflux disease)    Hemorrhoids    HTN (hypertension)    Negative renal duplex 07-22-11   Hx of echocardiogram    a. Echo 10/11: mod LVH, EF 55-60%   Pleural effusion 07/17/2020   Prostate cancer ([place]) 2000   Seed XRT   Spondylosis, cervical, with myelopathy 11/26/2015   Stroke ([place]) 8/01   right  thalamic - on chronic Plavix/ASA   Past Surgical History:  Procedure Laterality Date   APPENDECTOMY  1953   CAROTID ENDARTERECTOMY Right 01/2000   CATARACT EXTRACTION Bilateral    CERVICAL [place] SURGERY  8/07   HIP ARTHROPLASTY Left 10/24/2018   Procedure: ARTHROPLASTY BIPOLAR HIP (HEMIARTHROPLASTY);  Surgeon: Paralee Cancel, MD;  Location: [facility];  Service: Orthopedics;  Laterality: Left;   INSERTION PROSTATE RADIATION SEED  2000   LUMBAR [facility] SURGERY  1990s   NASAL SINUS SURGERY     x 3   THORACENTESIS N/A 08/15/2020   Procedure: THORACENTESIS;  Surgeon: [name], DO;  Location: [facility];  Service: Pulmonary;  Laterality: N/A;   Social History   Social History Narrative   Retired lives at home w/ his wife   1 daughter   Right-handed    Caffeine: drinks a lot of coffee    Former smoker, former alcoholic abstinent for many years   family history includes Alcohol abuse in his brother; Cancer in his brother; Diabetes in his son; Gout in his son; Hyperlipidemia in his mother; Hypertension in his mother; Lung disease in his father; Obesity in his sister; Stroke in his brother, mother, and sister.   Review of Systems  As per HPI Objective:   Physical Exam BP (!) 116/46 (BP Location: Left Arm, Patient Position: Sitting, Cuff Size: Normal)   Pulse 80   Ht 5\' 7"  (1.702 m)   Wt 144 lb 6 oz (65.5 kg)   BMI 22.61 kg/m  Elderly chronically ill white man in no acute distress Lungs show diffuse inspiratory and expiratory wheezes and fair air movement No supraclavicular or cervical adenopathy Normal S1-S2 no murmur The abdomen is soft with some mild left upper quadrant tenderness to be palpation but no organomegaly or mass Neuro he has a slow gait and needs some assistance he is alert and oriented x3

## 2020-08-19 NOTE — Patient Instructions (Signed)
Dr Carlean Purl is going to discuss testing with Dr Valeta Harms and we will be back in touch.   Call us back next week if you have not heard from [place].   I appreciate the opportunity to care for you. Silvano Rusk, MD, [facility]

## 2020-08-22 ENCOUNTER — Telehealth: Payer: Self-pay | Admitting: Family Medicine

## 2020-08-22 ENCOUNTER — Other Ambulatory Visit (HOSPITAL_COMMUNITY): Payer: Self-pay

## 2020-08-22 ENCOUNTER — Other Ambulatory Visit: Payer: Self-pay | Admitting: Family Medicine

## 2020-08-22 ENCOUNTER — Telehealth: Payer: Self-pay | Admitting: *Deleted

## 2020-08-22 NOTE — Telephone Encounter (Signed)
Mrs Kling calling stating pt is not eating and feels nausea and wants to know is Zofran and be refilled for him. Please advise.[name] KitchenMarland Kitchen

## 2020-08-22 NOTE — Telephone Encounter (Signed)
Patient's wife calling to report that the patient's HR has been in the 73s and he is sleeping a lot.  Sits down in chair and falls asleep.  Sleeping well at night.  She would like to know if med should be adjusted or if he can be seen. His most recent BP 165/70.  She is aware I will forward to Dr. Harrington Challenger for recommendations.

## 2020-08-22 NOTE — Telephone Encounter (Signed)
Pt just had thoracetesis  Nonmalignant    High volume Can he be added in to be seen   Looks like when saw GI in clinic diffuse wheezing He can hold metoprolol   Follow HR and BP

## 2020-08-23 ENCOUNTER — Other Ambulatory Visit (HOSPITAL_COMMUNITY): Payer: Self-pay

## 2020-08-23 ENCOUNTER — Other Ambulatory Visit: Payer: Self-pay | Admitting: Family Medicine

## 2020-08-23 ENCOUNTER — Telehealth: Payer: Self-pay | Admitting: Internal Medicine

## 2020-08-23 MED ORDER — ONDANSETRON 8 MG PO TBDP
8.0000 mg | ORAL_TABLET | Freq: Three times a day (TID) | ORAL | 0 refills | Status: AC | PRN
  Filled 2020-08-23: qty 20, 7d supply, fill #0

## 2020-08-23 NOTE — Telephone Encounter (Signed)
Patient and wife asking about plans for additional testing.  Do you have any plans yet? Patient has appointment with Dr. Valeta Harms  and Dr. Harrington Challenger next week. They have postponed their beach trip

## 2020-08-23 NOTE — Telephone Encounter (Signed)
Requesting: tramadol Contract: 07/16/20 UDS: 07/16/20 Last Visit: 07/16/20 Next Visit: 01/10/21 Last Refill: 07/25/20  Please Advise

## 2020-08-23 NOTE — Telephone Encounter (Signed)
Reviewed with patient's wife.  They will be going out of town next week and the soonest I could schedule pt w Dr. Harrington Challenger is 09/18/20.    She has been holding his Toprol 12.5 mg for the past few days and will continue to do so.  Reviewed his BP medications.  Our list is accurate as to what he is taking.     This am BP 176/68, HR 46.  I adv if Dr. Harrington Challenger wishes to make any other adjustments to his medications before Sept 7 we will call back.

## 2020-08-23 NOTE — Telephone Encounter (Signed)
Patients wife calling back to follow up on previous message states they are in need of knowing what's going on

## 2020-08-24 ENCOUNTER — Other Ambulatory Visit (HOSPITAL_COMMUNITY): Payer: Self-pay

## 2020-08-24 MED ORDER — TRAMADOL HCL 50 MG PO TABS
ORAL_TABLET | ORAL | 0 refills | Status: DC
  Filled 2020-08-24: qty 90, 30d supply, fill #0

## 2020-08-24 NOTE — Telephone Encounter (Signed)
I would recomm incasing lisinopril to 5 mg daily    Keep full log of BPs when send  His BP has been labile in the past   High and low

## 2020-08-26 NOTE — Telephone Encounter (Signed)
Patient wife notified.

## 2020-08-26 NOTE — Telephone Encounter (Signed)
Patient now will be in town this week, he has been scheduled to see Dr. Harrington Challenger tomorrow.

## 2020-08-26 NOTE — Telephone Encounter (Signed)
Please see below copies of messages about this. Short story is that we think PET scan makes sense as outlined in my note. My plan was to use tthe lung nodule(s) and weight loss but perhaps that is not enough. Staff got sidetracked ? On the prostate cancer which is old and not an issue   If we cannot get a PET then I would have him do a CT abdomen and pelvis with contrast.  This may have to wait until he sees Dr. Valeta Harms and Dr. Valeta Harms may be more adept at ordering the PET              Icard, Octavio Graves, DO  Gatha Mayer, MD  I agree. I think we can move directly for PET   Brad        Previous Messages   ----- Message -----  From: Gatha Mayer, MD  Sent: 08/19/2020   4:50 PM EDT  To: [name], DO  Subject: Question PET scan                               This man was in my office today.  You were recently familiar with him with a recurrent pleural effusion.  I think we both wonder about some underlying malignancy.  He has abdominal symptoms which are fairly chronic though sometimes a left pleural effusion could be related to some sort of GI process.  Amylase was okay on the fluid.   I was considering a CT of the abdomen and pelvis unless you think we have enough to go on for a full body PET scan.  Your thoughts are appreciated.   Thanks   Kordell Jafri      [place], Darene Lamer, CMA  Gatha Mayer, MD I see he had prostate cancer, do you know what stage. Robin and I looked and looked and can't find it. I can just call his wife and ask but thought you might know another place to look.        Previous Messages   ----- Message -----  From: Gatha Mayer, MD  Sent: 08/21/2020   2:36 PM EDT  To: Patti E [place], CMA  Subject: PET scan needed                                 I have pended an order for a PET scan on Mr. Dhaliwal.  I had communicated with Dr. Valeta Harms of pulmonary and tell him and his wife that we think a PET scan makes sense.  He can do this when he gets back from  the beach it will require preauthorization.  I have associated the diagnoses and filled out the order somewhat.  Let me know once you have done that and I will close the note.

## 2020-08-26 NOTE — Telephone Encounter (Signed)
Called patient's wife back and let her know Dr. Valeta Harms.. Is hoping to get a PET scan approved after he sees her husband this Friday 08/30/20.

## 2020-08-27 ENCOUNTER — Ambulatory Visit (INDEPENDENT_AMBULATORY_CARE_PROVIDER_SITE_OTHER): Payer: PPO | Admitting: Internal Medicine

## 2020-08-27 ENCOUNTER — Encounter: Payer: Self-pay | Admitting: Internal Medicine

## 2020-08-27 ENCOUNTER — Other Ambulatory Visit: Payer: Self-pay

## 2020-08-27 VITALS — BP 148/52 | HR 70 | Ht 70.0 in | Wt 143.8 lb

## 2020-08-27 DIAGNOSIS — I5032 Chronic diastolic (congestive) heart failure: Secondary | ICD-10-CM | POA: Diagnosis not present

## 2020-08-27 NOTE — Progress Notes (Signed)
Cardiology Office Note   Date:  08/28/2020   ID:  [name], DOB March 21, 1929, MRN [id number]  PCP:  [name], MD  Cardiologist:   [name], MD   F/U of HTN   History of Present Illness: [name] is a 87 y.o. male with a history of HTN (labile), CV dz (s/p L CEA 2002), HL, GERD, reactive airway dz, esophageal stricture, and PAF   Echo in 2020 LVEF and RVEF normal   I saw the pt in November  2021     The pt has been seen by pulmonary for pleural effusion   Thoracentesis in July 2022   Cytology neg for malignancy, lymphocytes  Concern for possible malignancy   Effusoin recurred    Diuresed with low dose lasix  Underwent repeat thoracentesis of 2 L on 8/4 again of amber colored fluid, cytology negative    Pt comes in for return visit today  His wife is with him  He says his breathing is better since thoracentesis  Wife continues to say that  he sleeps a lot.   BP readings from home without signficant spikes   His wife does say they don't add salt to food but the are eating out a lot    Current Meds  Medication Sig   acetaminophen (TYLENOL) 500 MG tablet Take 500 mg by mouth every 6 (six) hours as needed for mild pain or headache.   albuterol (VENTOLIN HFA) 108 (90 Base) MCG/ACT inhaler INHALE 2 PUFFS BY MOUTH INTO THE LUNGS EVERY 6 HOURS AS NEEDED   amLODipine (NORVASC) 5 MG tablet TAKE 2 TABLETS BY MOUTH DAILY (Patient taking differently: Take by mouth daily.)   apixaban (ELIQUIS) 5 MG TABS tablet TAKE 1 TABLET BY MOUTH 2 TIMES DAILY   atorvastatin (LIPITOR) 40 MG tablet Take 1 tablet by mouth daily.   cloNIDine (CATAPRES) 0.1 MG tablet TAKE 1 TABLET BY MOUTH 2 TIMES DAILY   famotidine (PEPCID) 40 MG tablet Take 1 tablet (40 mg total) by mouth daily.   fluticasone (FLOVENT HFA) 110 MCG/ACT inhaler Inhale 2 puffs into the lungs 2 (two) times daily.   lisinopril (ZESTRIL) 2.5 MG tablet TAKE 1 TABLET BY MOUTH DAILY.   Menthol-Methyl Salicylate (MUSCLE RUB) 10-15 % CREA  Apply 1 application topically 3 (three) times daily with meals. To BLE   metoCLOPramide (REGLAN) 5 MG tablet TAKE 1 TABLET BY MOUTH AT BEDTIME.   minoxidil (LONITEN) 10 MG tablet TAKE 1 TABLET BY MOUTH TWICE DAILY   ondansetron (ZOFRAN ODT) 8 MG disintegrating tablet Dissolve 1 tablet by mouth every 8 hours as needed for nausea or vomiting.   pantoprazole (PROTONIX) 40 MG tablet TAKE 1 TABLET BY MOUTH ONCE A DAY BEFORE BREAKFAST   polyethylene glycol (MIRALAX / GLYCOLAX) 17 g packet Take 17 g by mouth 2 (two) times daily.   polyvinyl alcohol (LIQUIFILM TEARS) 1.4 % ophthalmic solution Place 1 drop into both eyes as needed for dry eyes.    sucralfate (CARAFATE) 1 g tablet TAKE 1 TABLET BY MOUTH 4 TIMES DAILY WITH MEALS AND AT BEDTIME   traMADol (ULTRAM) 50 MG tablet TAKE 1 TABLET BY MOUTH 3 TIMES DAILY AS NEEDED FOR MODERATE PAIN   triamcinolone cream (KENALOG) 0.5 % Apply 1 application topically daily as needed (for skin irritation).    Vitamin D, Ergocalciferol, (DRISDOL) 1.25 MG (50000 UNIT) CAPS capsule TAKE 1 CAPSULE BY MOUTH EVERY 7 DAYS     Allergies:   Hctz [hydrochlorothiazide],  Hydralazine, Diovan [valsartan], Metformin and related, Nsaids, Other, Prednisone, Spironolactone, Codeine, and Oxycodone-acetaminophen   Past Medical History:  Diagnosis Date   Acute bronchitis 06/10/2015   Allergic rhinitis    Anemia    Asthma    Carotid stenosis    a. s/p Left CEA 2002;  b. Carotid US 3/16:  patent R CEA, L < 40%   Chronic pain syndrome    Left shoulder, back   Diverticulosis    Dyslipidemia    Epigastric pain 04/05/2016   Esophageal stricture    Distal, benign   GERD (gastroesophageal reflux disease)    Hemorrhoids    HTN (hypertension)    Negative renal duplex 07-22-11   Hx of echocardiogram    a. Echo 10/11: mod LVH, EF 55-60%   Pleural effusion 07/17/2020   Prostate cancer ([place]) 2000   Seed XRT   Spondylosis, cervical, with myelopathy 11/26/2015   Stroke ([place]) 8/01   right  thalamic - on chronic Plavix/ASA    Past Surgical History:  Procedure Laterality Date   APPENDECTOMY  1953   CAROTID ENDARTERECTOMY Right 01/2000   CATARACT EXTRACTION Bilateral    CERVICAL [place] SURGERY  8/07   HIP ARTHROPLASTY Left 10/24/2018   Procedure: ARTHROPLASTY BIPOLAR HIP (HEMIARTHROPLASTY);  Surgeon: Paralee Cancel, MD;  Location: [facility];  Service: Orthopedics;  Laterality: Left;   INSERTION PROSTATE RADIATION SEED  2000   LUMBAR [place] SURGERY  1990s   NASAL SINUS SURGERY     x 3   THORACENTESIS N/A 08/15/2020   Procedure: THORACENTESIS;  Surgeon: [name], DO;  Location: [facility];  Service: Pulmonary;  Laterality: N/A;     Social History:  The patient  reports that he quit smoking about 42 years ago. His smoking use included cigarettes. He started smoking about 72 years ago. He has a 8.75 pack-year smoking history. His smokeless tobacco use includes chew. He reports that he does not drink alcohol and does not use drugs.   Family History:  The patient's family history includes Alcohol abuse in his brother; Cancer in his brother; Diabetes in his son; Gout in his son; Hyperlipidemia in his mother; Hypertension in his mother; Lung disease in his father; Obesity in his sister; Stroke in his brother, mother, and sister.    ROS:  Please see the history of present illness. All other systems are reviewed and  Negative to the above problem except as noted.    PHYSICAL EXAM: VS:  BP (!) 148/52   Pulse 70   Ht 5\' 10"  (1.778 m)   Wt 143 lb 12.8 oz (65.2 kg)   SpO2 95%   BMI 20.63 kg/m   GEN: Thin 87 yo in no acute distress  HEENT: normal  Neck: no JVD Cardiac: RRR; no murmur;   Trivial LE edema  Respiratory:  Some diecreased airflow  Mild wheezing   Decreased BS at L base   GI: soft, nontender, nondistended, + BS   MS: no deformity Moving all extremities   Skin: warm and dry, no rash Neuro:  Strength and sensation are intact Psych: euthymic mood, full affect   EKG:   EKG is ordered today   NSR   Septal MI       Lipid Panel    Component Value Date/Time   CHOL 117 07/16/2020 1659   TRIG 144.0 07/16/2020 1659   HDL 45.90 07/16/2020 1659   CHOLHDL 3 07/16/2020 1659   VLDL 28.8 07/16/2020 1659   LDLCALC 42 07/16/2020 1659  Wt Readings from Last 3 Encounters:  08/27/20 143 lb 12.8 oz (65.2 kg)  08/19/20 144 lb 6 oz (65.5 kg)  08/12/20 146 lb 3.2 oz (66.3 kg)      ASSESSMENT AND PLAN: 1 Pleural effusion   Pt has been seen by pulmonary for L pleural effusion   He is now s/p thoracentesis x 2 from large volume removal   Cytology negative  Exam today show decreased BS at L base sugg reaccumulation.   Otherwsied exam with some mild volume increase Note that he is on 20 lasix now    Will get labs today   Consider increasing Will also get echo to reeval LVEF  Discussed Na intake with wife/patient   Eating out a lot Some of fluid may be due to diastolic CHF but overall volumes appear out of proportion to this Pt follows with Dr Valeta Harms, pulm   Has appt later this week     2  HTN   BP is overall not bad. He has had significant blood pressures elevations in past.   Readings from home not too bad  Again, he has may medicine sensitivities  3  CV dz   Mild disease bilaterally     4   HL  Keep on lipitor        Current medicines are reviewed at length with the patient today.  The patient does not have concerns regarding medicines.  Signed, [name], MD  08/28/2020 7:49 AM    [address], [place], [place]  [address] Phone: [phone]; Fax: [phone]

## 2020-08-27 NOTE — Patient Instructions (Signed)
Medication Instructions:  No changes today  *If you need a refill on your cardiac medications before your next appointment, please call your pharmacy*   Lab Work: Today: bmet, bnp  If you have labs (blood work) drawn today and your tests are completely normal, you will receive your results only by: [facility] (if you have MyChart) OR A paper copy in the mail If you have any lab test that is abnormal or we need to change your treatment, we will call you to review the results.   Testing/Procedures: LIMITED ECHO Your physician has requested that you have an echocardiogram. Echocardiography is a painless test that uses sound waves to create images of your heart. It provides your doctor with information about the size and shape of your heart and how well your heart's chambers and valves are working. This procedure takes approximately one hour. There are no restrictions for this procedure.   Follow-Up: Follow up with your physician will depend on test results.

## 2020-08-28 LAB — BASIC METABOLIC PANEL
BUN/Creatinine Ratio: 15 (ref 10–24)
BUN: 14 mg/dL (ref 8–27)
CO2: 23 mmol/L (ref 20–29)
Calcium: 9 mg/dL (ref 8.6–10.2)
Chloride: 105 mmol/L (ref 96–106)
Creatinine, Ser: 0.93 mg/dL (ref 0.76–1.27)
Glucose: 94 mg/dL (ref 65–99)
Potassium: 4.7 mmol/L (ref 3.5–5.2)
Sodium: 140 mmol/L (ref 134–144)
eGFR: 78 mL/min/{1.73_m2} (ref >59)

## 2020-08-28 LAB — PRO B NATRIURETIC PEPTIDE: NT-Pro BNP: 1372 pg/mL — ABNORMAL HIGH (ref 0–486)

## 2020-08-29 ENCOUNTER — Ambulatory Visit (HOSPITAL_COMMUNITY): Payer: PPO | Attending: Cardiovascular Disease

## 2020-08-29 ENCOUNTER — Other Ambulatory Visit: Payer: Self-pay

## 2020-08-29 DIAGNOSIS — I5032 Chronic diastolic (congestive) heart failure: Secondary | ICD-10-CM | POA: Insufficient documentation

## 2020-08-29 LAB — ECHOCARDIOGRAM LIMITED
Area-P 1/2: 2.66 cm2
S' Lateral: 2 cm

## 2020-08-30 ENCOUNTER — Other Ambulatory Visit (HOSPITAL_BASED_OUTPATIENT_CLINIC_OR_DEPARTMENT_OTHER): Payer: Self-pay

## 2020-08-30 ENCOUNTER — Telehealth: Payer: Self-pay | Admitting: Internal Medicine

## 2020-08-30 ENCOUNTER — Encounter: Payer: Self-pay | Admitting: Pulmonary Disease

## 2020-08-30 ENCOUNTER — Other Ambulatory Visit (HOSPITAL_COMMUNITY): Payer: Self-pay

## 2020-08-30 ENCOUNTER — Ambulatory Visit (INDEPENDENT_AMBULATORY_CARE_PROVIDER_SITE_OTHER): Payer: PPO | Admitting: Pulmonary Disease

## 2020-08-30 ENCOUNTER — Ambulatory Visit: Payer: PPO | Attending: Internal Medicine

## 2020-08-30 ENCOUNTER — Other Ambulatory Visit: Payer: Self-pay | Admitting: Internal Medicine

## 2020-08-30 VITALS — BP 114/76 | HR 76 | Ht 70.0 in | Wt 146.2 lb

## 2020-08-30 DIAGNOSIS — Z87891 Personal history of nicotine dependence: Secondary | ICD-10-CM | POA: Diagnosis not present

## 2020-08-30 DIAGNOSIS — J9819 Other pulmonary collapse: Secondary | ICD-10-CM

## 2020-08-30 DIAGNOSIS — I5032 Chronic diastolic (congestive) heart failure: Secondary | ICD-10-CM

## 2020-08-30 DIAGNOSIS — J90 Pleural effusion, not elsewhere classified: Secondary | ICD-10-CM | POA: Diagnosis not present

## 2020-08-30 DIAGNOSIS — Z23 Encounter for immunization: Secondary | ICD-10-CM

## 2020-08-30 DIAGNOSIS — Z79899 Other long term (current) drug therapy: Secondary | ICD-10-CM

## 2020-08-30 MED ORDER — MINOXIDIL 10 MG PO TABS
ORAL_TABLET | Freq: Two times a day (BID) | ORAL | 3 refills | Status: DC
  Filled 2020-08-30: qty 180, 90d supply, fill #0
  Filled 2020-11-29: qty 180, 90d supply, fill #1
  Filled 2021-03-03: qty 180, 90d supply, fill #2
  Filled 2021-06-13: qty 180, 90d supply, fill #3

## 2020-08-30 MED ORDER — POTASSIUM CHLORIDE ER 10 MEQ PO TBCR
10.0000 meq | EXTENDED_RELEASE_TABLET | Freq: Every day | ORAL | 1 refills | Status: DC
  Filled 2020-08-30: qty 30, 30d supply, fill #0

## 2020-08-30 MED ORDER — FUROSEMIDE 40 MG PO TABS
40.0000 mg | ORAL_TABLET | Freq: Every day | ORAL | 1 refills | Status: DC
  Filled 2020-08-30: qty 30, 30d supply, fill #0

## 2020-08-30 NOTE — Progress Notes (Signed)
Synopsis: Referred in July 2022 for left-sided pleural effusion by [name], MD  Subjective:   PATIENT ID: [name] GENDER: male DOB: [birth year], MRN: [id number]  Chief Complaint  Patient presents with   Follow-up    F/U after his thoracentesis. States he still has been SOB. Denies any chest pain.     87 year old gentleman, past medical history of carotid disease dyslipidemia, GERD, hypertension, prostate cancer.  Patient had a URI type illness several weeks ago.  It took several weeks for him to get better.  He is feeling better now.  Ultimately due to his persistent cough had a CT scan.  CT scan of the chest revealed left-sided unilateral pleural effusion.  Patient was referred for evaluation of this left-sided effusion.  OV 08/30/2020: Here today for evaluation of recurrent pleural effusion.  Patient had effusion tapped in the office last time.  Ultimately had follow-up with Patricia Nettle, NP with repeat chest x-ray.  Effusion had recurred.  We expected this as he did have a area of trapped lung on last x-ray.  However repeat film showed increasing effusion.  Patient was brought back for a repeat thoracentesis.Once again the repeat thoracentesis showed ex vacuo pneumothorax.  Pleural fluid analysis revealed elevated white blood cells, 87% lymphocytes within the pleural fluid.  Cultures negative.  Patient still feels short of breath approximately 2 weeks after every drainage.  He feels more short of breath again today.  They are planning to go out of town next week to the beach.     Past Medical History:  Diagnosis Date   Acute bronchitis 06/10/2015   Allergic rhinitis    Anemia    Asthma    Carotid stenosis    a. s/p Left CEA 2002;  b. Carotid US 3/16:  patent R CEA, L < 40%   Chronic pain syndrome    Left shoulder, back   Diverticulosis    Dyslipidemia    Epigastric pain 04/05/2016   Esophageal stricture    Distal, benign   GERD (gastroesophageal reflux disease)     Hemorrhoids    HTN (hypertension)    Negative renal duplex 07-22-11   Hx of echocardiogram    a. Echo 10/11: mod LVH, EF 55-60%   Pleural effusion 07/17/2020   Prostate cancer ([place]) 2000   Seed XRT   Spondylosis, cervical, with myelopathy 11/26/2015   Stroke ([place]) 8/01   right thalamic - on chronic Plavix/ASA     Family History  Problem Relation Age of Onset   Stroke Brother    Stroke Sister    Stroke Mother    Hyperlipidemia Mother    Hypertension Mother    Lung disease Father    Diabetes Son    Gout Son    Obesity Sister    Alcohol abuse Brother    Cancer Brother        lung, smoker     Past Surgical History:  Procedure Laterality Date   APPENDECTOMY  1953   CAROTID ENDARTERECTOMY Right 01/2000   CATARACT EXTRACTION Bilateral    CERVICAL [place] SURGERY  8/07   HIP ARTHROPLASTY Left 10/24/2018   Procedure: ARTHROPLASTY BIPOLAR HIP (HEMIARTHROPLASTY);  Surgeon: Paralee Cancel, MD;  Location: [facility];  Service: Orthopedics;  Laterality: Left;   INSERTION PROSTATE RADIATION SEED  2000   LUMBAR DISC SURGERY  1990s   NASAL SINUS SURGERY     x 3   THORACENTESIS N/A 08/15/2020   Procedure: THORACENTESIS;  Surgeon: June Leap  L, DO;  Location: [facility];  Service: Pulmonary;  Laterality: N/A;    Social History   Socioeconomic History   Marital status: Married    Spouse name: Not on file   Number of children: 1   Years of education: 10   Highest education level: Not on file  Occupational History   Occupation: Retired  Tobacco Use   Smoking status: Former    Packs/day: 0.25    Years: 35.00    Pack years: 8.75    Types: Cigarettes    Start date: 1950    Quit date: 01/12/1978    Years since quitting: 42.6   Smokeless tobacco: Current    Types: Chew   Tobacco comments:    Started smoking at age 67  Vaping Use   Vaping Use: Never used  Substance and Sexual Activity   Alcohol use: No    Comment: Prior alcoholic, sober since 6195   Drug use: No   Sexual activity:  Not Currently  Other Topics Concern   Not on file  Social History Narrative   Retired lives at home w/ his wife   1 daughter   Right-handed    Caffeine: drinks a lot of coffee    Former smoker, former alcoholic abstinent for many years   Social Determinants of Radio broadcast assistant Strain: Not on file  Food Insecurity: Not on file  Transportation Needs: Not on file  Physical Activity: Not on file  Stress: Not on file  Social Connections: Not on file  Intimate Partner Violence: Not on file     Allergies  Allergen Reactions   Hctz [Hydrochlorothiazide] Shortness Of Breath, Swelling and Other (See Comments)    "Swelling and dyspnea"    Hydralazine Shortness Of Breath and Other (See Comments)    Chest pain and GI issues also   Diovan [Valsartan] Other (See Comments)    Elevated potassium- Hyperkalemia   Metformin And Related Other (See Comments)    Reaction not recalled   Nsaids Other (See Comments)    Other than Tylenol, he isn't to have these because he's on Eliquis   Other Other (See Comments)    Unnamed topically-applied B/P patch = Caused redness   Prednisone Other (See Comments)    Suicidal thoughts   Spironolactone Swelling    Site of swelling not recalled   Codeine Itching and Nausea Only   Oxycodone-Acetaminophen Itching and Nausea Only     Outpatient Medications Prior to Visit  Medication Sig Dispense Refill   acetaminophen (TYLENOL) 500 MG tablet Take 500 mg by mouth every 6 (six) hours as needed for mild pain or headache.     albuterol (VENTOLIN HFA) 108 (90 Base) MCG/ACT inhaler INHALE 2 PUFFS BY MOUTH INTO THE LUNGS EVERY 6 HOURS AS NEEDED 18 g 0   amLODipine (NORVASC) 5 MG tablet TAKE 2 TABLETS BY MOUTH DAILY (Patient taking differently: Take by mouth daily.) 180 tablet 2   apixaban (ELIQUIS) 5 MG TABS tablet TAKE 1 TABLET BY MOUTH 2 TIMES DAILY 60 tablet 5   atorvastatin (LIPITOR) 40 MG tablet Take 1 tablet by mouth daily. 90 tablet 1   cloNIDine  (CATAPRES) 0.1 MG tablet TAKE 1 TABLET BY MOUTH 2 TIMES DAILY 180 tablet 3   famotidine (PEPCID) 40 MG tablet Take 1 tablet (40 mg total) by mouth daily. 30 tablet 2   fluticasone (FLOVENT HFA) 110 MCG/ACT inhaler Inhale 2 puffs into the lungs 2 (two) times daily. 1 Inhaler 0  lisinopril (ZESTRIL) 2.5 MG tablet TAKE 1 TABLET BY MOUTH DAILY. 90 tablet 1   Menthol-Methyl Salicylate (MUSCLE RUB) 10-15 % CREA Apply 1 application topically 3 (three) times daily with meals. To BLE 85 g 0   metoCLOPramide (REGLAN) 5 MG tablet TAKE 1 TABLET BY MOUTH AT BEDTIME. 30 tablet 5   minoxidil (LONITEN) 10 MG tablet TAKE 1 TABLET BY MOUTH TWICE DAILY 180 tablet 3   ondansetron (ZOFRAN ODT) 8 MG disintegrating tablet Dissolve 1 tablet by mouth every 8 hours as needed for nausea or vomiting. 20 tablet 0   pantoprazole (PROTONIX) 40 MG tablet TAKE 1 TABLET BY MOUTH ONCE A DAY BEFORE BREAKFAST 90 tablet 3   polyethylene glycol (MIRALAX / GLYCOLAX) 17 g packet Take 17 g by mouth 2 (two) times daily. 28 packet 0   polyvinyl alcohol (LIQUIFILM TEARS) 1.4 % ophthalmic solution Place 1 drop into both eyes as needed for dry eyes.      sucralfate (CARAFATE) 1 g tablet TAKE 1 TABLET BY MOUTH 4 TIMES DAILY WITH MEALS AND AT BEDTIME 90 tablet 0   traMADol (ULTRAM) 50 MG tablet TAKE 1 TABLET BY MOUTH 3 TIMES DAILY AS NEEDED FOR MODERATE PAIN 90 tablet 0   triamcinolone cream (KENALOG) 0.5 % Apply 1 application topically daily as needed (for skin irritation).      Vitamin D, Ergocalciferol, (DRISDOL) 1.25 MG (50000 UNIT) CAPS capsule TAKE 1 CAPSULE BY MOUTH EVERY 7 DAYS 12 capsule 0   ferrous sulfate (FERROUSUL) 325 (65 FE) MG tablet Take 1 tablet (325 mg total) by mouth 3 (three) times daily with meals for 14 days. (Patient taking differently: Take 325 mg by mouth in the morning and at bedtime.) 90 tablet 2   gabapentin (NEURONTIN) 300 MG capsule TAKE 1 CAPSULE BY MOUTH 3 TIMES DAILY 270 capsule 1   No facility-administered  medications prior to visit.    Review of Systems  Constitutional:  Negative for chills, fever, malaise/fatigue and weight loss.  HENT:  Negative for hearing loss, sore throat and tinnitus.   Eyes:  Negative for blurred vision and double vision.  Respiratory:  Positive for shortness of breath. Negative for cough, hemoptysis, sputum production, wheezing and stridor.   Cardiovascular:  Negative for chest pain, palpitations, orthopnea, leg swelling and PND.  Gastrointestinal:  Negative for abdominal pain, constipation, diarrhea, heartburn, nausea and vomiting.  Genitourinary:  Negative for dysuria, hematuria and urgency.  Musculoskeletal:  Negative for joint pain and myalgias.  Skin:  Negative for itching and rash.  Neurological:  Negative for dizziness, tingling, weakness and headaches.  Endo/Heme/Allergies:  Negative for environmental allergies. Does not bruise/bleed easily.  Psychiatric/Behavioral:  Negative for depression. The patient is not nervous/anxious and does not have insomnia.   All other systems reviewed and are negative.   Objective:  Physical Exam Vitals reviewed.  Constitutional:      General: He is not in acute distress.    Appearance: He is well-developed.  HENT:     Head: Normocephalic and atraumatic.  Eyes:     General: No scleral icterus.    Conjunctiva/sclera: Conjunctivae normal.     Pupils: Pupils are equal, round, and reactive to light.  Neck:     Vascular: No JVD.     Trachea: No tracheal deviation.  Cardiovascular:     Rate and Rhythm: Normal rate and regular rhythm.     Heart sounds: Normal heart sounds. No murmur heard. Pulmonary:     Effort: Pulmonary effort is normal.  No tachypnea, accessory muscle usage or respiratory distress.     Breath sounds: No stridor. No wheezing, rhonchi or rales.     Comments: Diminished breath sounds in the left base compared to the right Abdominal:     General: Bowel sounds are normal. There is no distension.      Palpations: Abdomen is soft.     Tenderness: There is no abdominal tenderness.  Musculoskeletal:        General: No tenderness.     Cervical back: Neck supple.  Lymphadenopathy:     Cervical: No cervical adenopathy.  Skin:    General: Skin is warm and dry.     Capillary Refill: Capillary refill takes less than 2 seconds.     Findings: No rash.  Neurological:     Mental Status: He is alert and oriented to person, place, and time.  Psychiatric:        Behavior: Behavior normal.     Vitals:   08/30/20 0913  BP: 114/76  Pulse: 76  SpO2: 97%  Weight: 146 lb 3.2 oz (66.3 kg)  Height: 5\' 10"  (1.778 m)    97% on RA BMI Readings from Last 3 Encounters:  08/30/20 20.98 kg/m  08/27/20 20.63 kg/m  08/19/20 22.61 kg/m   Wt Readings from Last 3 Encounters:  08/30/20 146 lb 3.2 oz (66.3 kg)  08/27/20 143 lb 12.8 oz (65.2 kg)  08/19/20 144 lb 6 oz (65.5 kg)     CBC    Component Value Date/Time   WBC 8.6 07/16/2020 1659   RBC 3.83 (L) 07/16/2020 1659   HGB 12.1 (L) 07/16/2020 1659   HGB 11.3 (L) 11/23/2019 1209   HCT 35.1 (L) 07/16/2020 1659   HCT 34.7 (L) 11/23/2019 1209   PLT 286.0 07/16/2020 1659   PLT 326 11/23/2019 1209   MCV 91.6 07/16/2020 1659   MCV 94 11/23/2019 1209   MCH 30.5 11/23/2019 1209   MCH 30.4 10/31/2018 0859   MCHC 34.4 07/16/2020 1659   RDW 15.0 07/16/2020 1659   RDW 13.6 11/23/2019 1209   LYMPHSABS 1.0 07/16/2020 1659   MONOABS 0.5 07/16/2020 1659   EOSABS 0.1 07/16/2020 1659   BASOSABS 0.1 07/16/2020 1659    Pleural Fluid Analysis: Results for DAXTON, NYDAM "RAY" (MRN [id number]) as of 08/30/2020 07:41  Ref. Range 08/15/2020 16:57  Albumin, Fluid Latest Units: g/dL 2.3  Fluid Type-FALB Unknown PLEURAL  Cholesterol, Fluid Latest Units: mg/dL 50  Chol, Fluid Type Unknown PLEURAL  Glucose, Fluid Latest Units: mg/dL 110  Fluid Type-FGLU Unknown PLEURAL  Fluid Type-FLDH Unknown PLEURAL  LD, Fluid Latest Ref Range: 3 - 23 U/L 158 (H)  Total  protein, fluid Latest Units: g/dL 3.7  Triglycerides, Fluid Latest Ref Range: Not Estab. mg/dL 18  Fluid Type-FCT Unknown PLEURAL  Fluid Type-FTP Unknown PLEURAL  Fluid Type-FTRIG Unknown PLEURAL  Color, Fluid Latest Ref Range: YELLOW  YELLOW (A)  Total Nucleated Cell Count, Fluid Latest Ref Range: 0 - 1,000 cu mm 792  Lymphs, Fluid Latest Units: % 87  Eos, Fluid Latest Units: % 11  Appearance, Fluid Latest Ref Range: CLEAR  CLEAR  Neutrophil Count, Fluid Latest Ref Range: 0 - 25 % 1  Monocyte-Macrophage-Serous Fluid Latest Ref Range: 50 - 90 % 1 (L)    Chest Imaging: Chest x-ray today following thoracentesis: Post thoracentesis ex vacuo pneumothorax  CT chest 07/09/2020: Left-sided pleural effusion.  Nothing significantly abnormal within the lung parenchyma. The patient's images have been independently reviewed by  me.    Pulmonary Functions Testing Results: No flowsheet data found.  FeNO:   Pathology:   Echocardiogram:   Heart Catheterization:     Assessment & Plan:     ICD-10-CM   1. Recurrent left pleural effusion  J90 NM PET Image Initial (PI) Skull Base To Thigh (F-18 FDG)    2. Trapped lung  J98.19 NM PET Image Initial (PI) Skull Base To Thigh (F-18 FDG)    3. Pleural effusion  J90 NM PET Image Initial (PI) Skull Base To Thigh (F-18 FDG)    4. Chronic diastolic congestive heart failure (HCC)  I50.32     5. Former smoker  Z87.891        Discussion:  This is an 87 year old gentleman, left-sided new lateral effusion, status post 2 thoracentesis with lymphocyte predominance.  Cultures negative.  No known malignancy.  He is a former smoker however.  Cytology has been negative x2 for cancer cells.  He has a ex vacuo pneumothorax and likely trapped lung.  The pleura is definitely scarred and will not allow reexpansion of the lung.  Plan: We discussed all of the next best steps for a trapped lung and recurrent effusion. We have yet to rule out malignancy in this  gentleman. I think the next best step would be obtaining a PET scan. Unfortunately, due to his age I am not sure that a thoracic surgery would be the best option. I am not sure that he would tolerate this well he is also not interested in having a major procedure. Additionally, we discussed repeat thoracentesis as he does feel better with drainage. Additional options will be inserting a Pleurx catheter or indwelling pleural catheter. He is planned to go to the beach next week. Therefore we will set up in clinic for next week a repeat thoracentesis. Additionally we will schedule his IPC placement after his [place] next week and when he returns from vacation. If we can allow drainage with an IPC at least every other day there is a chance for auto pleurodesis if that section or area of the lung scars down to the pleura.  This will hopefully help with his shortness of breath and recurrent fluid accumulation.     Current Outpatient Medications:    acetaminophen (TYLENOL) 500 MG tablet, Take 500 mg by mouth every 6 (six) hours as needed for mild pain or headache., Disp: , Rfl:    albuterol (VENTOLIN HFA) 108 (90 Base) MCG/ACT inhaler, INHALE 2 PUFFS BY MOUTH INTO THE LUNGS EVERY 6 HOURS AS NEEDED, Disp: 18 g, Rfl: 0   amLODipine (NORVASC) 5 MG tablet, TAKE 2 TABLETS BY MOUTH DAILY (Patient taking differently: Take by mouth daily.), Disp: 180 tablet, Rfl: 2   apixaban (ELIQUIS) 5 MG TABS tablet, TAKE 1 TABLET BY MOUTH 2 TIMES DAILY, Disp: 60 tablet, Rfl: 5   atorvastatin (LIPITOR) 40 MG tablet, Take 1 tablet by mouth daily., Disp: 90 tablet, Rfl: 1   cloNIDine (CATAPRES) 0.1 MG tablet, TAKE 1 TABLET BY MOUTH 2 TIMES DAILY, Disp: 180 tablet, Rfl: 3   famotidine (PEPCID) 40 MG tablet, Take 1 tablet (40 mg total) by mouth daily., Disp: 30 tablet, Rfl: 2   fluticasone (FLOVENT HFA) 110 MCG/ACT inhaler, Inhale 2 puffs into the lungs 2 (two) times daily., Disp: 1 Inhaler, Rfl: 0   lisinopril (ZESTRIL) 2.5 MG  tablet, TAKE 1 TABLET BY MOUTH DAILY., Disp: 90 tablet, Rfl: 1   Menthol-Methyl Salicylate (MUSCLE RUB) 10-15 % CREA, Apply 1 application  topically 3 (three) times daily with meals. To BLE, Disp: 85 g, Rfl: 0   metoCLOPramide (REGLAN) 5 MG tablet, TAKE 1 TABLET BY MOUTH AT BEDTIME., Disp: 30 tablet, Rfl: 5   minoxidil (LONITEN) 10 MG tablet, TAKE 1 TABLET BY MOUTH TWICE DAILY, Disp: 180 tablet, Rfl: 3   ondansetron (ZOFRAN ODT) 8 MG disintegrating tablet, Dissolve 1 tablet by mouth every 8 hours as needed for nausea or vomiting., Disp: 20 tablet, Rfl: 0   pantoprazole (PROTONIX) 40 MG tablet, TAKE 1 TABLET BY MOUTH ONCE A DAY BEFORE BREAKFAST, Disp: 90 tablet, Rfl: 3   polyethylene glycol (MIRALAX / GLYCOLAX) 17 g packet, Take 17 g by mouth 2 (two) times daily., Disp: 28 packet, Rfl: 0   polyvinyl alcohol (LIQUIFILM TEARS) 1.4 % ophthalmic solution, Place 1 drop into both eyes as needed for dry eyes. , Disp: , Rfl:    sucralfate (CARAFATE) 1 g tablet, TAKE 1 TABLET BY MOUTH 4 TIMES DAILY WITH MEALS AND AT BEDTIME, Disp: 90 tablet, Rfl: 0   traMADol (ULTRAM) 50 MG tablet, TAKE 1 TABLET BY MOUTH 3 TIMES DAILY AS NEEDED FOR MODERATE PAIN, Disp: 90 tablet, Rfl: 0   triamcinolone cream (KENALOG) 0.5 %, Apply 1 application topically daily as needed (for skin irritation). , Disp: , Rfl:    Vitamin D, Ergocalciferol, (DRISDOL) 1.25 MG (50000 UNIT) CAPS capsule, TAKE 1 CAPSULE BY MOUTH EVERY 7 DAYS, Disp: 12 capsule, Rfl: 0   ferrous sulfate (FERROUSUL) 325 (65 FE) MG tablet, Take 1 tablet (325 mg total) by mouth 3 (three) times daily with meals for 14 days. (Patient taking differently: Take 325 mg by mouth in the morning and at bedtime.), Disp: 90 tablet, Rfl: 2   gabapentin (NEURONTIN) 300 MG capsule, TAKE 1 CAPSULE BY MOUTH 3 TIMES DAILY, Disp: 270 capsule, Rfl: 1   [name], DO [facility] 08/30/2020 10:18 AM

## 2020-08-30 NOTE — Telephone Encounter (Addendum)
  Spoke with the pt and his wife and they agree to starting the lasix and K after talking with Dr. Valeta Harms today... they will have repeat labs Monday 09/09/20 after they return home from their trip. She will monitor him for symptoms and any problems will take him to the Urgent Care while away from home.   Fay Records, MD  Rodman Key, RN Cc: [place] [facility] Reviewed phone note from 8/18    Pt should take 40 Lasix with 10 KCL    Will need BMET and BNP in 1 wk

## 2020-08-30 NOTE — Telephone Encounter (Signed)
Pts wife is reaching out with questions about pt starting his lasix... please advise

## 2020-08-30 NOTE — Telephone Encounter (Signed)
-----   Message from Fay Records, MD sent at 08/30/2020 12:00 AM EDT ----- Reviewed phone note from 8/18    Pt should take 40 Lasix with 10 KCL   Will need BMET and BNP in 1 wk

## 2020-08-30 NOTE — Progress Notes (Signed)
   Covid-19 Vaccination Clinic  Name:  [name]    MRN: [id number] DOB: 03-31-1929  08/30/2020  Mr. Boateng was observed post Covid-19 immunization for 15 minutes without incident. He was provided with Vaccine Information Sheet and instruction to access the V-Safe system.   Mr. Venable was instructed to call 911 with any severe reactions post vaccine: Difficulty breathing  Swelling of face and throat  A fast heartbeat  A bad rash all over body  Dizziness and weakness   Immunizations Administered     Name Date Dose VIS Date Route   PFIZER Comrnaty(Gray TOP) Covid-19 Vaccine 08/30/2020  2:28 PM 0.3 mL 12/21/2019 Intramuscular   Manufacturer: [place]   Lot: [id number]   NDC: [phone]

## 2020-08-30 NOTE — Telephone Encounter (Addendum)
Spoke with the pts wife and she verbalized understanding of the lab results however they are currently at Dr. Juline Patch office/ pulmonary and he is possibly having a thoracentesis today.. I advised her to let Dr. Valeta Harms know that we are prescribing a fluid pill for him for the next week but she can call us back and let us know if they are willing to try based on Dr. Harrington Challenger' recommendations.   I advised her that even after the thoracenteses he still may need the diuretic base on his BNP... she says he is not having lower extremity edema or abdominal distention.

## 2020-08-30 NOTE — Patient Instructions (Signed)
Thank you for visiting Dr. Valeta Harms at [facility]. Today we recommend the following:  Orders Placed This Encounter  Procedures   NM PET Image Initial (PI) Skull Base To Thigh (F-18 FDG)   Hold Eliquis Planned thoracentesis on Monday   Return in about 3 days (around 09/02/2020) for Dr. Valeta Harms .    Please do your part to reduce the spread of COVID-19.

## 2020-08-31 ENCOUNTER — Other Ambulatory Visit (HOSPITAL_COMMUNITY): Payer: Self-pay

## 2020-09-02 ENCOUNTER — Ambulatory Visit (INDEPENDENT_AMBULATORY_CARE_PROVIDER_SITE_OTHER): Payer: PPO

## 2020-09-02 ENCOUNTER — Other Ambulatory Visit (HOSPITAL_COMMUNITY)
Admission: RE | Admit: 2020-09-02 | Discharge: 2020-09-02 | Disposition: A | Discharge status: Home / Self Care | Payer: PPO | Source: Ambulatory Visit | Attending: Pulmonary Disease | Admitting: Pulmonary Disease

## 2020-09-02 ENCOUNTER — Other Ambulatory Visit (HOSPITAL_COMMUNITY): Payer: Self-pay

## 2020-09-02 ENCOUNTER — Ambulatory Visit (INDEPENDENT_AMBULATORY_CARE_PROVIDER_SITE_OTHER): Payer: PPO | Admitting: Pulmonary Disease

## 2020-09-02 ENCOUNTER — Ambulatory Visit: Payer: PPO

## 2020-09-02 ENCOUNTER — Encounter: Payer: Self-pay | Admitting: Pulmonary Disease

## 2020-09-02 ENCOUNTER — Other Ambulatory Visit: Payer: Self-pay | Admitting: Pulmonary Disease

## 2020-09-02 ENCOUNTER — Other Ambulatory Visit: Payer: Self-pay

## 2020-09-02 VITALS — BP 112/58 | HR 65 | Temp 98.3°F | Ht 70.0 in | Wt 143.2 lb

## 2020-09-02 DIAGNOSIS — J90 Pleural effusion, not elsewhere classified: Secondary | ICD-10-CM

## 2020-09-02 DIAGNOSIS — Z9889 Other specified postprocedural states: Secondary | ICD-10-CM

## 2020-09-02 DIAGNOSIS — Z87891 Personal history of nicotine dependence: Secondary | ICD-10-CM

## 2020-09-02 DIAGNOSIS — D72820 Lymphocytosis (symptomatic): Secondary | ICD-10-CM | POA: Insufficient documentation

## 2020-09-02 DIAGNOSIS — J9811 Atelectasis: Secondary | ICD-10-CM | POA: Diagnosis not present

## 2020-09-02 DIAGNOSIS — J9819 Other pulmonary collapse: Secondary | ICD-10-CM | POA: Diagnosis not present

## 2020-09-02 NOTE — Progress Notes (Signed)
Synopsis: Referred in July 2022 for left-sided pleural effusion by [name], MD  Subjective:   PATIENT ID: [name] GENDER: male DOB: 01/01/1930, MRN: [id number]  Chief Complaint  Patient presents with   Follow-up    Pt is here today to have a thoracentesis performed. Pt states that he feels okay, states that he does have some chest discomfort and some SOB.    87 year old gentleman, past medical history of carotid disease dyslipidemia, GERD, hypertension, prostate cancer.  Patient had a URI type illness several weeks ago.  It took several weeks for him to get better.  He is feeling better now.  Ultimately due to his persistent cough had a CT scan.  CT scan of the chest revealed left-sided unilateral pleural effusion.  Patient was referred for evaluation of this left-sided effusion.  OV 08/30/2020: Here today for evaluation of recurrent pleural effusion.  Patient had effusion tapped in the office last time.  Ultimately had follow-up with Patricia Nettle, NP with repeat chest x-ray.  Effusion had recurred.  We expected this as he did have a area of trapped lung on last x-ray.  However repeat film showed increasing effusion.  Patient was brought back for a repeat thoracentesis.Once again the repeat thoracentesis showed ex vacuo pneumothorax.  Pleural fluid analysis revealed elevated white blood cells, 87% lymphocytes within the pleural fluid.  Cultures negative.  Patient still feels short of breath approximately 2 weeks after every drainage.  He feels more short of breath again today.  They are planning to go out of town next week to the beach.  OV 09/02/2020: I saw patient today in the office. Discussed risks benefits and alternatives. They agreed to proceed today. Eliquis has been held. Last visit was in the hospital for drainage and he had pain. But now doing better.     Past Medical History:  Diagnosis Date   Acute bronchitis 06/10/2015   Allergic rhinitis    Anemia    Asthma     Carotid stenosis    a. s/p Left CEA 2002;  b. Carotid US 3/16:  patent R CEA, L < 40%   Chronic pain syndrome    Left shoulder, back   Diverticulosis    Dyslipidemia    Epigastric pain 04/05/2016   Esophageal stricture    Distal, benign   GERD (gastroesophageal reflux disease)    Hemorrhoids    HTN (hypertension)    Negative renal duplex 07-22-11   Hx of echocardiogram    a. Echo 10/11: mod LVH, EF 55-60%   Pleural effusion 07/17/2020   Prostate cancer ([place]) 2000   Seed XRT   Spondylosis, cervical, with myelopathy 11/26/2015   Stroke ([place]) 8/01   right thalamic - on chronic Plavix/ASA     Family History  Problem Relation Age of Onset   Stroke Brother    Stroke Sister    Stroke Mother    Hyperlipidemia Mother    Hypertension Mother    Lung disease Father    Diabetes Son    Gout Son    Obesity Sister    Alcohol abuse Brother    Cancer Brother        lung, smoker     Past Surgical History:  Procedure Laterality Date   APPENDECTOMY  1953   CAROTID ENDARTERECTOMY Right 01/2000   CATARACT EXTRACTION Bilateral    CERVICAL [place] SURGERY  8/07   HIP ARTHROPLASTY Left 10/24/2018   Procedure: ARTHROPLASTY BIPOLAR HIP (HEMIARTHROPLASTY);  Surgeon: Alvan Dame,  Rodman Key, MD;  Location: [facility];  Service: Orthopedics;  Laterality: Left;   INSERTION PROSTATE RADIATION SEED  2000   LUMBAR DISC SURGERY  1990s   NASAL SINUS SURGERY     x 3   THORACENTESIS N/A 08/15/2020   Procedure: THORACENTESIS;  Surgeon: [name], DO;  Location: [facility];  Service: Pulmonary;  Laterality: N/A;    Social History   Socioeconomic History   Marital status: Married    Spouse name: Not on file   Number of children: 1   Years of education: 10   Highest education level: Not on file  Occupational History   Occupation: Retired  Tobacco Use   Smoking status: Former    Packs/day: 0.25    Years: 35.00    Pack years: 8.75    Types: Cigarettes    Start date: 1950    Quit date: 01/12/1978    Years  since quitting: 42.6   Smokeless tobacco: Current    Types: Chew   Tobacco comments:    Started smoking at age 89  Vaping Use   Vaping Use: Never used  Substance and Sexual Activity   Alcohol use: No    Comment: Prior alcoholic, sober since 3154   Drug use: No   Sexual activity: Not Currently  Other Topics Concern   Not on file  Social History Narrative   Retired lives at home w/ his wife   1 daughter   Right-handed    Caffeine: drinks a lot of coffee    Former smoker, former alcoholic abstinent for many years   Social Determinants of Radio broadcast assistant Strain: Not on file  Food Insecurity: Not on file  Transportation Needs: Not on file  Physical Activity: Not on file  Stress: Not on file  Social Connections: Not on file  Intimate Partner Violence: Not on file     Allergies  Allergen Reactions   Hctz [Hydrochlorothiazide] Shortness Of Breath, Swelling and Other (See Comments)    "Swelling and dyspnea"    Hydralazine Shortness Of Breath and Other (See Comments)    Chest pain and GI issues also   Diovan [Valsartan] Other (See Comments)    Elevated potassium- Hyperkalemia   Metformin And Related Other (See Comments)    Reaction not recalled   Nsaids Other (See Comments)    Other than Tylenol, he isn't to have these because he's on Eliquis   Other Other (See Comments)    Unnamed topically-applied B/P patch = Caused redness   Prednisone Other (See Comments)    Suicidal thoughts   Spironolactone Swelling    Site of swelling not recalled   Codeine Itching and Nausea Only   Oxycodone-Acetaminophen Itching and Nausea Only     Outpatient Medications Prior to Visit  Medication Sig Dispense Refill   acetaminophen (TYLENOL) 500 MG tablet Take 500 mg by mouth every 6 (six) hours as needed for mild pain or headache.     albuterol (VENTOLIN HFA) 108 (90 Base) MCG/ACT inhaler INHALE 2 PUFFS BY MOUTH INTO THE LUNGS EVERY 6 HOURS AS NEEDED 18 g 0   amLODipine  (NORVASC) 5 MG tablet TAKE 2 TABLETS BY MOUTH DAILY (Patient taking differently: Take by mouth daily.) 180 tablet 2   apixaban (ELIQUIS) 5 MG TABS tablet TAKE 1 TABLET BY MOUTH 2 TIMES DAILY 60 tablet 5   atorvastatin (LIPITOR) 40 MG tablet Take 1 tablet by mouth daily. 90 tablet 1   cloNIDine (CATAPRES) 0.1 MG tablet TAKE  1 TABLET BY MOUTH 2 TIMES DAILY 180 tablet 3   famotidine (PEPCID) 40 MG tablet Take 1 tablet (40 mg total) by mouth daily. 30 tablet 2   fluticasone (FLOVENT HFA) 110 MCG/ACT inhaler Inhale 2 puffs into the lungs 2 (two) times daily. 1 Inhaler 0   furosemide (LASIX) 40 MG tablet Take 1 tablet (40 mg total) by mouth daily. 30 tablet 1   lisinopril (ZESTRIL) 2.5 MG tablet TAKE 1 TABLET BY MOUTH DAILY. 90 tablet 1   Menthol-Methyl Salicylate (MUSCLE RUB) 10-15 % CREA Apply 1 application topically 3 (three) times daily with meals. To BLE 85 g 0   metoCLOPramide (REGLAN) 5 MG tablet TAKE 1 TABLET BY MOUTH AT BEDTIME. 30 tablet 5   minoxidil (LONITEN) 10 MG tablet TAKE 1 TABLET BY MOUTH TWICE DAILY 180 tablet 3   ondansetron (ZOFRAN ODT) 8 MG disintegrating tablet Dissolve 1 tablet by mouth every 8 hours as needed for nausea or vomiting. 20 tablet 0   pantoprazole (PROTONIX) 40 MG tablet TAKE 1 TABLET BY MOUTH ONCE A DAY BEFORE BREAKFAST 90 tablet 3   polyvinyl alcohol (LIQUIFILM TEARS) 1.4 % ophthalmic solution Place 1 drop into both eyes as needed for dry eyes.      potassium chloride (KLOR-CON) 10 MEQ tablet Take 1 tablet (10 mEq total) by mouth daily. 30 tablet 1   sucralfate (CARAFATE) 1 g tablet TAKE 1 TABLET BY MOUTH 4 TIMES DAILY WITH MEALS AND AT BEDTIME 90 tablet 0   traMADol (ULTRAM) 50 MG tablet TAKE 1 TABLET BY MOUTH 3 TIMES DAILY AS NEEDED FOR MODERATE PAIN 90 tablet 0   triamcinolone cream (KENALOG) 0.5 % Apply 1 application topically daily as needed (for skin irritation).      ferrous sulfate (FERROUSUL) 325 (65 FE) MG tablet Take 1 tablet (325 mg total) by mouth 3  (three) times daily with meals for 14 days. (Patient taking differently: Take 325 mg by mouth in the morning and at bedtime.) 90 tablet 2   gabapentin (NEURONTIN) 300 MG capsule TAKE 1 CAPSULE BY MOUTH 3 TIMES DAILY 270 capsule 1   polyethylene glycol (MIRALAX / GLYCOLAX) 17 g packet Take 17 g by mouth 2 (two) times daily. (Patient not taking: Reported on 09/02/2020) 28 packet 0   Vitamin D, Ergocalciferol, (DRISDOL) 1.25 MG (50000 UNIT) CAPS capsule TAKE 1 CAPSULE BY MOUTH EVERY 7 DAYS (Patient not taking: Reported on 09/02/2020) 12 capsule 0   No facility-administered medications prior to visit.    Review of Systems  Constitutional:  Negative for chills, fever, malaise/fatigue and weight loss.  HENT:  Negative for hearing loss, sore throat and tinnitus.   Eyes:  Negative for blurred vision and double vision.  Respiratory:  Positive for shortness of breath. Negative for cough, hemoptysis, sputum production, wheezing and stridor.   Cardiovascular:  Negative for chest pain, palpitations, orthopnea, leg swelling and PND.  Gastrointestinal:  Negative for abdominal pain, constipation, diarrhea, heartburn, nausea and vomiting.  Genitourinary:  Negative for dysuria, hematuria and urgency.  Musculoskeletal:  Negative for joint pain and myalgias.  Skin:  Negative for itching and rash.  Neurological:  Negative for dizziness, tingling, weakness and headaches.  Endo/Heme/Allergies:  Negative for environmental allergies. Does not bruise/bleed easily.  Psychiatric/Behavioral:  Negative for depression. The patient is not nervous/anxious and does not have insomnia.   All other systems reviewed and are negative.   Objective:  Physical Exam Vitals reviewed.  Constitutional:      General: He is  not in acute distress.    Appearance: He is well-developed.  HENT:     Head: Normocephalic and atraumatic.  Eyes:     General: No scleral icterus.    Conjunctiva/sclera: Conjunctivae normal.     Pupils: Pupils  are equal, round, and reactive to light.  Neck:     Vascular: No JVD.     Trachea: No tracheal deviation.  Cardiovascular:     Rate and Rhythm: Normal rate and regular rhythm.     Heart sounds: Normal heart sounds. No murmur heard. Pulmonary:     Effort: Pulmonary effort is normal. No tachypnea, accessory muscle usage or respiratory distress.     Breath sounds: No stridor. No wheezing, rhonchi or rales.     Comments: Diminished BL lower lobes  Abdominal:     General: Bowel sounds are normal. There is no distension.     Palpations: Abdomen is soft.     Tenderness: There is no abdominal tenderness.  Musculoskeletal:        General: No tenderness.     Cervical back: Neck supple.  Lymphadenopathy:     Cervical: No cervical adenopathy.  Skin:    General: Skin is warm and dry.     Capillary Refill: Capillary refill takes less than 2 seconds.     Findings: No rash.  Neurological:     Mental Status: He is alert and oriented to person, place, and time.  Psychiatric:        Behavior: Behavior normal.     Vitals:   09/02/20 1121  BP: (!) 112/58  Pulse: 65  Temp: 98.3 F (36.8 C)  TempSrc: Oral  SpO2: 96%  Weight: 143 lb 3.2 oz (65 kg)  Height: 5\' 10"  (1.778 m)    96% on RA BMI Readings from Last 3 Encounters:  09/02/20 20.55 kg/m  08/30/20 20.98 kg/m  08/27/20 20.63 kg/m   Wt Readings from Last 3 Encounters:  09/02/20 143 lb 3.2 oz (65 kg)  08/30/20 146 lb 3.2 oz (66.3 kg)  08/27/20 143 lb 12.8 oz (65.2 kg)     CBC    Component Value Date/Time   WBC 8.6 07/16/2020 1659   RBC 3.83 (L) 07/16/2020 1659   HGB 12.1 (L) 07/16/2020 1659   HGB 11.3 (L) 11/23/2019 1209   HCT 35.1 (L) 07/16/2020 1659   HCT 34.7 (L) 11/23/2019 1209   PLT 286.0 07/16/2020 1659   PLT 326 11/23/2019 1209   MCV 91.6 07/16/2020 1659   MCV 94 11/23/2019 1209   MCH 30.5 11/23/2019 1209   MCH 30.4 10/31/2018 0859   MCHC 34.4 07/16/2020 1659   RDW 15.0 07/16/2020 1659   RDW 13.6  11/23/2019 1209   LYMPHSABS 1.0 07/16/2020 1659   MONOABS 0.5 07/16/2020 1659   EOSABS 0.1 07/16/2020 1659   BASOSABS 0.1 07/16/2020 1659    Pleural Fluid Analysis: Results for YOSKAR, MURRILLO "RAY" (MRN [id number]) as of 08/30/2020 07:41  Ref. Range 08/15/2020 16:57  Albumin, Fluid Latest Units: g/dL 2.3  Fluid Type-FALB Unknown PLEURAL  Cholesterol, Fluid Latest Units: mg/dL 50  Chol, Fluid Type Unknown PLEURAL  Glucose, Fluid Latest Units: mg/dL 110  Fluid Type-FGLU Unknown PLEURAL  Fluid Type-FLDH Unknown PLEURAL  LD, Fluid Latest Ref Range: 3 - 23 U/L 158 (H)  Total protein, fluid Latest Units: g/dL 3.7  Triglycerides, Fluid Latest Ref Range: Not Estab. mg/dL 18  Fluid Type-FCT Unknown PLEURAL  Fluid Type-FTP Unknown PLEURAL  Fluid Type-FTRIG Unknown PLEURAL  Color, Fluid  Latest Ref Range: YELLOW  YELLOW (A)  Total Nucleated Cell Count, Fluid Latest Ref Range: 0 - 1,000 cu mm 792  Lymphs, Fluid Latest Units: % 87  Eos, Fluid Latest Units: % 11  Appearance, Fluid Latest Ref Range: CLEAR  CLEAR  Neutrophil Count, Fluid Latest Ref Range: 0 - 25 % 1  Monocyte-Macrophage-Serous Fluid Latest Ref Range: 50 - 90 % 1 (L)    Chest Imaging: Chest x-ray today following thoracentesis: Post thoracentesis ex vacuo pneumothorax  CT chest 07/09/2020: Left-sided pleural effusion.  Nothing significantly abnormal within the lung parenchyma. The patient's images have been independently reviewed by me.    Pulmonary Functions Testing Results: No flowsheet data found.  FeNO:   Pathology:   Echocardiogram:   Heart Catheterization:     Assessment & Plan:     ICD-10-CM   1. 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 Former smoker  Z87.891     4. S/P thoracentesis  Z98.890       Discussion:  This is an 87 year old gentleman, left-sided new lateral effusion, status post 2 thoracentesis with  lymphocyte predominance.  Cultures negative.  No known malignancy.  He is a former smoker however.  Cytology has been negative x2 for cancer cells.  He has a ex vacuo pneumothorax and likely trapped lung.  The pleura is definitely scarred and will not allow reexpansion of the lung.  Plan: Plan for repeat thoracentesis today  Discussed risks benefits and alternatives  He is agreeable to proceed Consent obtained.  Please see separate note.   I think we need to consider IPC placement in the future if the effusion continues to re-occur  I have ordered a PET that is awaiting to be scheduled.     Current Outpatient Medications:    acetaminophen (TYLENOL) 500 MG tablet, Take 500 mg by mouth every 6 (six) hours as needed for mild pain or headache., Disp: , Rfl:    albuterol (VENTOLIN HFA) 108 (90 Base) MCG/ACT inhaler, INHALE 2 PUFFS BY MOUTH INTO THE LUNGS EVERY 6 HOURS AS NEEDED, Disp: 18 g, Rfl: 0   amLODipine (NORVASC) 5 MG tablet, TAKE 2 TABLETS BY MOUTH DAILY (Patient taking differently: Take by mouth daily.), Disp: 180 tablet, Rfl: 2   apixaban (ELIQUIS) 5 MG TABS tablet, TAKE 1 TABLET BY MOUTH 2 TIMES DAILY, Disp: 60 tablet, Rfl: 5   atorvastatin (LIPITOR) 40 MG tablet, Take 1 tablet by mouth daily., Disp: 90 tablet, Rfl: 1   cloNIDine (CATAPRES) 0.1 MG tablet, TAKE 1 TABLET BY MOUTH 2 TIMES DAILY, Disp: 180 tablet, Rfl: 3   famotidine (PEPCID) 40 MG tablet, Take 1 tablet (40 mg total) by mouth daily., Disp: 30 tablet, Rfl: 2   fluticasone (FLOVENT HFA) 110 MCG/ACT inhaler, Inhale 2 puffs into the lungs 2 (two) times daily., Disp: 1 Inhaler, Rfl: 0   furosemide (LASIX) 40 MG tablet, Take 1 tablet (40 mg total) by mouth daily., Disp: 30 tablet, Rfl: 1   lisinopril (ZESTRIL) 2.5 MG tablet, TAKE 1 TABLET BY MOUTH DAILY., Disp: 90 tablet, Rfl: 1   Menthol-Methyl Salicylate (MUSCLE RUB) 10-15 % CREA, Apply 1 application topically 3 (three) times daily with meals. To BLE, Disp: 85 g, Rfl: 0    metoCLOPramide (REGLAN) 5 MG tablet, TAKE 1 TABLET BY MOUTH AT BEDTIME., Disp: 30 tablet,  Rfl: 5   minoxidil (LONITEN) 10 MG tablet, TAKE 1 TABLET BY MOUTH TWICE DAILY, Disp: 180 tablet, Rfl: 3   ondansetron (ZOFRAN ODT) 8 MG disintegrating tablet, Dissolve 1 tablet by mouth every 8 hours as needed for nausea or vomiting., Disp: 20 tablet, Rfl: 0   pantoprazole (PROTONIX) 40 MG tablet, TAKE 1 TABLET BY MOUTH ONCE A DAY BEFORE BREAKFAST, Disp: 90 tablet, Rfl: 3   polyvinyl alcohol (LIQUIFILM TEARS) 1.4 % ophthalmic solution, Place 1 drop into both eyes as needed for dry eyes. , Disp: , Rfl:    potassium chloride (KLOR-CON) 10 MEQ tablet, Take 1 tablet (10 mEq total) by mouth daily., Disp: 30 tablet, Rfl: 1   sucralfate (CARAFATE) 1 g tablet, TAKE 1 TABLET BY MOUTH 4 TIMES DAILY WITH MEALS AND AT BEDTIME, Disp: 90 tablet, Rfl: 0   traMADol (ULTRAM) 50 MG tablet, TAKE 1 TABLET BY MOUTH 3 TIMES DAILY AS NEEDED FOR MODERATE PAIN, Disp: 90 tablet, Rfl: 0   triamcinolone cream (KENALOG) 0.5 %, Apply 1 application topically daily as needed (for skin irritation). , Disp: , Rfl:    ferrous sulfate (FERROUSUL) 325 (65 FE) MG tablet, Take 1 tablet (325 mg total) by mouth 3 (three) times daily with meals for 14 days. (Patient taking differently: Take 325 mg by mouth in the morning and at bedtime.), Disp: 90 tablet, Rfl: 2   gabapentin (NEURONTIN) 300 MG capsule, TAKE 1 CAPSULE BY MOUTH 3 TIMES DAILY, Disp: 270 capsule, Rfl: 1   polyethylene glycol (MIRALAX / GLYCOLAX) 17 g packet, Take 17 g by mouth 2 (two) times daily. (Patient not taking: Reported on 09/02/2020), Disp: 28 packet, Rfl: 0   Vitamin D, Ergocalciferol, (DRISDOL) 1.25 MG (50000 UNIT) CAPS capsule, TAKE 1 CAPSULE BY MOUTH EVERY 7 DAYS (Patient not taking: Reported on 09/02/2020), Disp: 12 capsule, Rfl: 0   [name], DO [facility] 09/02/2020 12:28 PM

## 2020-09-02 NOTE — Progress Notes (Signed)
Thoracentesis  Procedure Note  [name]  [id number]  1929/06/22  Date:09/02/20  Time:12:30 PM   Provider Performing:Lyndsay Talamante [name]   Procedure: Thoracentesis with imaging guidance (38101)  Indication(s) Pleural Effusion  Consent Risks of the procedure as well as the alternatives and risks of each were explained to the patient and/or caregiver.  Consent for the procedure was obtained and is signed in the bedside chart  Anesthesia Topical only with 1% lidocaine   Time Out Verified patient identification, verified procedure, site/side was marked, verified correct patient position, special equipment/implants available, medications/allergies/relevant history reviewed, required imaging and test results available.  Sterile Technique Maximal sterile technique including full sterile barrier drape, hand hygiene, sterile gown, sterile gloves, mask, hair covering, sterile ultrasound probe cover (if used).  Procedure Description Ultrasound was used to identify appropriate pleural anatomy for placement and overlying skin marked.  Area of drainage cleaned and draped in sterile fashion. Lidocaine was used to anesthetize the skin and subcutaneous tissue.  1000 cc's of amber fluid appearing fluid was drained from the left pleural space. Catheter then removed and bandaid applied to site.  Complications/Tolerance None; patient tolerated the procedure well. Chest X-ray is ordered to confirm no post-procedural complication.  EBL Minimal  Specimen(s) Pleural fluid      Scott Nash, DO [facility] 09/02/2020 12:30 PM

## 2020-09-03 ENCOUNTER — Other Ambulatory Visit (HOSPITAL_COMMUNITY): Payer: Self-pay

## 2020-09-03 LAB — CYTOLOGY - NON PAP

## 2020-09-09 ENCOUNTER — Other Ambulatory Visit: Payer: PPO

## 2020-09-12 ENCOUNTER — Ambulatory Visit (HOSPITAL_COMMUNITY): Payer: PPO

## 2020-09-17 ENCOUNTER — Telehealth: Payer: Self-pay | Admitting: Pulmonary Disease

## 2020-09-17 ENCOUNTER — Encounter (HOSPITAL_COMMUNITY)
Admission: RE | Admit: 2020-09-17 | Discharge: 2020-09-17 | Disposition: A | Discharge status: Home / Self Care | Payer: PPO | Source: Ambulatory Visit | Attending: Pulmonary Disease | Admitting: Pulmonary Disease

## 2020-09-17 ENCOUNTER — Other Ambulatory Visit: Payer: Self-pay

## 2020-09-17 ENCOUNTER — Other Ambulatory Visit: Payer: PPO | Admitting: *Deleted

## 2020-09-17 ENCOUNTER — Other Ambulatory Visit (HOSPITAL_COMMUNITY): Payer: Self-pay

## 2020-09-17 DIAGNOSIS — I700 Atherosclerosis of aorta: Secondary | ICD-10-CM | POA: Insufficient documentation

## 2020-09-17 DIAGNOSIS — I2510 Atherosclerotic heart disease of native coronary artery without angina pectoris: Secondary | ICD-10-CM | POA: Insufficient documentation

## 2020-09-17 DIAGNOSIS — J90 Pleural effusion, not elsewhere classified: Secondary | ICD-10-CM

## 2020-09-17 DIAGNOSIS — J9819 Other pulmonary collapse: Secondary | ICD-10-CM | POA: Diagnosis not present

## 2020-09-17 DIAGNOSIS — D72820 Lymphocytosis (symptomatic): Secondary | ICD-10-CM | POA: Diagnosis not present

## 2020-09-17 DIAGNOSIS — Z8546 Personal history of malignant neoplasm of prostate: Secondary | ICD-10-CM | POA: Diagnosis not present

## 2020-09-17 DIAGNOSIS — I5032 Chronic diastolic (congestive) heart failure: Secondary | ICD-10-CM

## 2020-09-17 DIAGNOSIS — K56609 Unspecified intestinal obstruction, unspecified as to partial versus complete obstruction: Secondary | ICD-10-CM | POA: Diagnosis not present

## 2020-09-17 DIAGNOSIS — Z79899 Other long term (current) drug therapy: Secondary | ICD-10-CM | POA: Diagnosis not present

## 2020-09-17 LAB — GLUCOSE, CAPILLARY: Glucose-Capillary: 99 mg/dL (ref 70–99)

## 2020-09-17 MED ORDER — FLUDEOXYGLUCOSE F - 18 (FDG) INJECTION
7.4000 | Freq: Once | INTRAVENOUS | Status: AC | PRN
  Administered 2020-09-17: 7.12 via INTRAVENOUS

## 2020-09-17 MED FILL — Clonidine HCl Tab 0.1 MG: ORAL | 90 days supply | Qty: 180 | Fill #1 | Status: AC

## 2020-09-17 MED FILL — Amlodipine Besylate Tab 5 MG (Base Equivalent): ORAL | 90 days supply | Qty: 180 | Fill #1 | Status: AC

## 2020-09-17 MED FILL — Apixaban Tab 5 MG: ORAL | 30 days supply | Qty: 60 | Fill #4 | Status: AC

## 2020-09-17 NOTE — Telephone Encounter (Signed)
I called and spoke with wife [name], who is on DPR regarding message. Wife is calling in regards to see if patient needs to be seen in office as he was being seen every few weeks due to fluid in lungs. I checked last OV note and did not see any notes on follow-up. Patient had PET scan done today. Wife stated patient is doing well, [place] Will route to Dr. Valeta Harms for recs.   Dr. Valeta Harms, please advise. Thanks!

## 2020-09-18 ENCOUNTER — Ambulatory Visit: Payer: BC Managed Care – PPO | Admitting: Internal Medicine

## 2020-09-18 ENCOUNTER — Other Ambulatory Visit (HOSPITAL_COMMUNITY): Payer: Self-pay

## 2020-09-18 LAB — BASIC METABOLIC PANEL
BUN/Creatinine Ratio: 23 (ref 10–24)
BUN: 30 mg/dL — ABNORMAL HIGH (ref 8–27)
CO2: 24 mmol/L (ref 20–29)
Calcium: 9 mg/dL (ref 8.6–10.2)
Chloride: 107 mmol/L — ABNORMAL HIGH (ref 96–106)
Creatinine, Ser: 1.3 mg/dL — ABNORMAL HIGH (ref 0.76–1.27)
Glucose: 141 mg/dL — ABNORMAL HIGH (ref 65–99)
Potassium: 5 mmol/L (ref 3.5–5.2)
Sodium: 144 mmol/L (ref 134–144)
eGFR: 53 mL/min/{1.73_m2} — ABNORMAL LOW (ref >59)

## 2020-09-18 LAB — FUNGUS CULTURE WITH STAIN

## 2020-09-18 LAB — PRO B NATRIURETIC PEPTIDE: NT-Pro BNP: 1107 pg/mL — ABNORMAL HIGH (ref 0–486)

## 2020-09-18 LAB — FUNGAL ORGANISM REFLEX

## 2020-09-18 LAB — FUNGUS CULTURE RESULT

## 2020-09-18 NOTE — Telephone Encounter (Signed)
Wife called in regards to message sent yesterday. Dr. Valeta Harms has not responded to message yet. Will route to him again.  Dr. Valeta Harms, please advise on mychart message from 09/18/20 thanks!

## 2020-09-18 NOTE — Telephone Encounter (Signed)
[name] wife is checking on appointment. [name] phone number is [phone].

## 2020-09-18 NOTE — Telephone Encounter (Signed)
Scott Nash, DO to [place], [place] [facility]  [facility] Triage Pool     5:05 PM  His effusion looks ok. It is small. I would hold off on draining at this time.  I happy to see him tomorrow for a work in appt or via video visit to discuss further.   Thanks   BLI   I spoke with the pt's spouse  She was notified of results  She states pt unable to do a mychart visit  She is asking when would be a good time for him to come back for in person visit  Next available I believe is mid oct Is this okay?

## 2020-09-19 ENCOUNTER — Other Ambulatory Visit (HOSPITAL_COMMUNITY): Payer: Self-pay

## 2020-09-19 ENCOUNTER — Telehealth: Payer: Self-pay | Admitting: *Deleted

## 2020-09-19 DIAGNOSIS — I5032 Chronic diastolic (congestive) heart failure: Secondary | ICD-10-CM

## 2020-09-19 MED ORDER — FUROSEMIDE 40 MG PO TABS
ORAL_TABLET | ORAL | 3 refills | Status: DC
  Filled 2020-09-19: qty 90, fill #0
  Filled 2020-09-30: qty 90, 60d supply, fill #0

## 2020-09-19 NOTE — Telephone Encounter (Signed)
Called and let pt's wife know that pt should return 09/25/20 for blood work.changed appointment.

## 2020-09-19 NOTE — Telephone Encounter (Signed)
-----   Message from Fay Records, MD sent at 09/18/2020  7:32 AM EDT ----- BUN/CR is up a little   Fluid is still up Sugar elevated at 141 1.  Cut lasix to 20mg  / 40 mg (alternating days) 2  Hold KCL. 3  Check BMET and BNP in 2 wks

## 2020-09-19 NOTE — Telephone Encounter (Signed)
[name], DO to Me     7:33 AM Yes put him in for October.  If his shortness of breath feels worse we will repeat his CXR sooner and see me sooner.  Otherwise just continue to monitor   Thanks   BLI    I called and spoke with the pt's spouse and made her aware of response per Dr Valeta Harms  She verbalized understanding  Appt for 10/23/20 was scheduled

## 2020-09-19 NOTE — Telephone Encounter (Signed)
Spoke w patient's wife.  Adv of recommendations.  She states the patient has already decreased his lasix to 20 mg alternating with 40 mg every other day.  She reports he did this on his own 1-2 weeks ago.  She will ask him to maintain this schedule and stop potassium.  He is coming for repeat labs on 10/03/20.    Will route to Dr. Harrington Challenger to make aware.

## 2020-09-19 NOTE — Telephone Encounter (Signed)
Get labs checked next week   Mid week

## 2020-09-20 ENCOUNTER — Other Ambulatory Visit: Payer: Self-pay | Admitting: Family Medicine

## 2020-09-20 ENCOUNTER — Other Ambulatory Visit (HOSPITAL_COMMUNITY): Payer: Self-pay

## 2020-09-20 MED ORDER — TRAMADOL HCL 50 MG PO TABS
ORAL_TABLET | ORAL | 0 refills | Status: DC
  Filled 2020-09-20: qty 90, 30d supply, fill #0

## 2020-09-21 ENCOUNTER — Other Ambulatory Visit (HOSPITAL_COMMUNITY): Payer: Self-pay

## 2020-09-21 ENCOUNTER — Ambulatory Visit: Payer: PPO

## 2020-09-23 ENCOUNTER — Other Ambulatory Visit (HOSPITAL_BASED_OUTPATIENT_CLINIC_OR_DEPARTMENT_OTHER): Payer: Self-pay

## 2020-09-23 MED ORDER — COVID-19 MRNA VAC-TRIS(PFIZER) 30 MCG/0.3ML IM SUSP
INTRAMUSCULAR | 0 refills | Status: DC
  Filled 2020-09-23: qty 0.3, 1d supply, fill #0

## 2020-09-23 NOTE — Progress Notes (Signed)
I discussed results. Please see separate messaging. He has an appt follow up pending.   Thanks,  BLI  [name], DO [facility] 09/23/2020 9:07 PM

## 2020-09-25 ENCOUNTER — Telehealth: Payer: Self-pay | Admitting: Pulmonary Disease

## 2020-09-25 ENCOUNTER — Other Ambulatory Visit: Payer: PPO

## 2020-09-25 NOTE — Telephone Encounter (Signed)
Left message for patient's spouse to call back.

## 2020-09-26 ENCOUNTER — Telehealth: Payer: Self-pay | Admitting: Internal Medicine

## 2020-09-26 NOTE — Telephone Encounter (Signed)
[name] wife is returning phone call. [name] phone number is [phone].

## 2020-09-26 NOTE — Telephone Encounter (Signed)
Left message for patient's wife to call back.   Labs from 9/6:  Fay Records, MD  09/18/2020  7:32 AM EDT     BUN/CR is up a little   Fluid is still up Sugar elevated at 141 1.  Cut lasix to 20mg  / 40 mg (alternating days) 2  Hold KCL. 3  Check BMET and BNP in 2 wks      Discussed this w patient's wife on 09/19/20.

## 2020-09-26 NOTE — Telephone Encounter (Signed)
   Pt c/o medication issue:  1. Name of Medication: furosemide (LASIX) 40 MG tablet  2. How are you currently taking this medication (dosage and times per day)?   3. Are you having a reaction (difficulty breathing--STAT)?   4. What is your medication issue? [name] calling, she has question about pt's fluid pill, she wanted to know why pt's potassium was discontinued

## 2020-09-26 NOTE — Telephone Encounter (Signed)
Called and spoke with patient's wife. She wanted to know if he could be seen any earlier by [facility]. She is concerned about possible fluid up. He has had a slight increase in SOB and she does not want to wait until October. I advised her that BI is completed booked until mid October but I could get him scheduled with an APP. She agreed.   I was able to get him scheduled for 10/01/20 at 3pm with TP. She verbalized understanding of appt.   Nothing further needed at time of call.

## 2020-09-30 ENCOUNTER — Other Ambulatory Visit (HOSPITAL_COMMUNITY): Payer: Self-pay

## 2020-09-30 NOTE — Telephone Encounter (Signed)
Reviewed with patient's wife the medication plan below based on his lab results.  Reviewed lasix 20 mg daily alternating with 40 mg every other and that he should be off potassium due to last K+ 5.0. she voices understanding.

## 2020-10-01 ENCOUNTER — Telehealth: Payer: Self-pay | Admitting: Adult Health

## 2020-10-01 ENCOUNTER — Telehealth: Payer: Self-pay | Admitting: Internal Medicine

## 2020-10-01 ENCOUNTER — Ambulatory Visit (INDEPENDENT_AMBULATORY_CARE_PROVIDER_SITE_OTHER): Payer: PPO | Admitting: Adult Health

## 2020-10-01 ENCOUNTER — Ambulatory Visit (INDEPENDENT_AMBULATORY_CARE_PROVIDER_SITE_OTHER): Payer: PPO

## 2020-10-01 ENCOUNTER — Encounter: Payer: Self-pay | Admitting: Adult Health

## 2020-10-01 ENCOUNTER — Other Ambulatory Visit: Payer: Self-pay

## 2020-10-01 ENCOUNTER — Ambulatory Visit: Payer: PPO

## 2020-10-01 ENCOUNTER — Other Ambulatory Visit: Payer: Self-pay | Admitting: *Deleted

## 2020-10-01 DIAGNOSIS — Z9889 Other specified postprocedural states: Secondary | ICD-10-CM

## 2020-10-01 DIAGNOSIS — R948 Abnormal results of function studies of other organs and systems: Secondary | ICD-10-CM | POA: Diagnosis not present

## 2020-10-01 DIAGNOSIS — J90 Pleural effusion, not elsewhere classified: Secondary | ICD-10-CM | POA: Diagnosis not present

## 2020-10-01 NOTE — Patient Instructions (Addendum)
Please follow up with Dr. Carlean Purl regarding abnormal PET scan for your colon .  Albuterol inhaler As needed   Activity as tolerated. Follow up in office with Dr. Valeta Harms as planned and As needed   Please contact office for sooner follow up if symptoms do not improve or worsen or seek emergency care

## 2020-10-01 NOTE — Telephone Encounter (Signed)
PET scan does not show evidence of cancer in the lungs or anywhere else though there is some increased uptake in the sigmoid colon.  Need to consider a sigmoidoscopy.  Have called all the phone numbers to discuss and no pickups.  I will call again later this week.

## 2020-10-01 NOTE — Progress Notes (Signed)
@Patient  ID: [name], male    DOB: 03-13-1929, 87 y.o.   MRN: [id number]  Chief Complaint  Patient presents with   Follow-up    Referring provider: [name], MD  HPI: 87 year old male former smoker followed for recurrent pleural effusion Medical history significant for carotid disease, dyslipidemia, prostate cancer, atrial fibrillation on Eliquis  TEST/EVENTS :   10/01/2020 Follow up : Pleural effusion Patient has a known history of pleural effusion he underwent a thoracentesis July 18, 2020.  Cytology was negative for malignant cells.  Showed lymphocytosis.  Cultures were negative.  Patient had a follow-up chest x-ray August 12, 2020 that showed a moderate to large left pleural effusion.  Patient was set up for a repeat thoracentesis completed on August 4 and again on August 22.  (For recurrent effusion) chest x-ray August 22 postthoracentesis shows  And a small left residual pleural effusion. PET scan done on September 17, 2020 showed no suspicious metabolic activity in the thorax to suggest malignancy or metastatic disease.  No hypermetabolic activity and the residual left pleural effusion which is decreased in size.  No suspicious activity within the prostate gland or abdominal pelvic lymph nodes.  No suspicious osseous findings. There was some nonspecific activity along the sigmoid colon with some narrowing. Patient says overall he is doing okay.  Does get winded with heavy activities.  He has had no increase shortness of breath or decreased activity tolerance.  That he does not like activities at home but does not walk for long period of time.  He denies any increased leg swelling.  Feels that since he started on Lasix his breathing has been improved   Allergies  Allergen Reactions   Hctz [Hydrochlorothiazide] Shortness Of Breath, Swelling and Other (See Comments)    "Swelling and dyspnea"    Hydralazine Shortness Of Breath and Other (See Comments)    Chest pain and GI  issues also   Diovan [Valsartan] Other (See Comments)    Elevated potassium- Hyperkalemia   Metformin And Related Other (See Comments)    Reaction not recalled   Nsaids Other (See Comments)    Other than Tylenol, he isn't to have these because he's on Eliquis   Other Other (See Comments)    Unnamed topically-applied B/P patch = Caused redness   Prednisone Other (See Comments)    Suicidal thoughts   Spironolactone Swelling    Site of swelling not recalled   Codeine Itching and Nausea Only   Oxycodone-Acetaminophen Itching and Nausea Only    Immunization History  Administered Date(s) Administered   Fluad Quad(high Dose 65+) 10/17/2018   Influenza Split 09/12/2012   Influenza Whole 11/04/2006, 10/11/2008, 09/24/2011   Influenza, High Dose Seasonal PF 10/08/2015, 11/02/2017   Influenza-Unspecified 10/05/2013, 10/13/2014   PFIZER Comirnaty(Gray Top)Covid-19 Tri-Sucrose Vaccine 08/30/2020   PFIZER(Purple Top)SARS-COV-2 Vaccination 02/05/2019, 02/27/2019   Pneumococcal Conjugate-13 10/05/2013   Pneumococcal Polysaccharide-23 01/13/2003, 12/10/2014   Tetanus 12/12/2013   Zoster, Live 11/24/2011    Past Medical History:  Diagnosis Date   Acute bronchitis 06/10/2015   Allergic rhinitis    Anemia    Asthma    Carotid stenosis    a. s/p Left CEA 2002;  b. Carotid US 3/16:  patent R CEA, L < 40%   Chronic pain syndrome    Left shoulder, back   Diverticulosis    Dyslipidemia    Epigastric pain 04/05/2016   Esophageal stricture    Distal, benign   GERD (gastroesophageal reflux disease)  Hemorrhoids    HTN (hypertension)    Negative renal duplex 07-22-11   Hx of echocardiogram    a. Echo 10/11: mod LVH, EF 55-60%   Pleural effusion 07/17/2020   Prostate cancer ([place]) 2000   Seed XRT   Spondylosis, cervical, with myelopathy 11/26/2015   Stroke ([place]) 8/01   right thalamic - on chronic Plavix/ASA    Tobacco History: Social History   Tobacco Use  Smoking Status Former    Packs/day: 0.25   Years: 35.00   Pack years: 8.75   Types: Cigarettes   Start date: 64   Quit date: 01/12/1978   Years since quitting: 42.7  Smokeless Tobacco Current   Types: Chew  Tobacco Comments   Started smoking at age 38   Ready to quit: Not Answered Counseling given: Not Answered Tobacco comments: Started smoking at age 56   Outpatient Medications Prior to Visit  Medication Sig Dispense Refill   acetaminophen (TYLENOL) 500 MG tablet Take 500 mg by mouth every 6 (six) hours as needed for mild pain or headache.     albuterol (VENTOLIN HFA) 108 (90 Base) MCG/ACT inhaler INHALE 2 PUFFS BY MOUTH INTO THE LUNGS EVERY 6 HOURS AS NEEDED 18 g 0   amLODipine (NORVASC) 5 MG tablet TAKE 2 TABLETS BY MOUTH DAILY (Patient taking differently: Take by mouth daily.) 180 tablet 2   apixaban (ELIQUIS) 5 MG TABS tablet TAKE 1 TABLET BY MOUTH 2 TIMES DAILY 60 tablet 5   atorvastatin (LIPITOR) 40 MG tablet Take 1 tablet by mouth daily. 90 tablet 1   cloNIDine (CATAPRES) 0.1 MG tablet TAKE 1 TABLET BY MOUTH 2 TIMES DAILY 180 tablet 3   COVID-19 mRNA Vac-TriS, Pfizer, SUSP injection Inject into the muscle. 0.3 mL 0   famotidine (PEPCID) 40 MG tablet Take 1 tablet (40 mg total) by mouth daily. 30 tablet 2   fluticasone (FLOVENT HFA) 110 MCG/ACT inhaler Inhale 2 puffs into the lungs 2 (two) times daily. 1 Inhaler 0   furosemide (LASIX) 40 MG tablet Take 1 tablet (40 mg) by mouth daily alternating with 1/2 tablet (20 mg) every other day 90 tablet 3   lisinopril (ZESTRIL) 2.5 MG tablet TAKE 1 TABLET BY MOUTH DAILY. 90 tablet 1   Menthol-Methyl Salicylate (MUSCLE RUB) 10-15 % CREA Apply 1 application topically 3 (three) times daily with meals. To BLE 85 g 0   metoCLOPramide (REGLAN) 5 MG tablet TAKE 1 TABLET BY MOUTH AT BEDTIME. 30 tablet 5   minoxidil (LONITEN) 10 MG tablet TAKE 1 TABLET BY MOUTH TWICE DAILY 180 tablet 3   ondansetron (ZOFRAN ODT) 8 MG disintegrating tablet Dissolve 1 tablet by mouth  every 8 hours as needed for nausea or vomiting. 20 tablet 0   pantoprazole (PROTONIX) 40 MG tablet TAKE 1 TABLET BY MOUTH ONCE A DAY BEFORE BREAKFAST 90 tablet 3   polyethylene glycol (MIRALAX / GLYCOLAX) 17 g packet Take 17 g by mouth 2 (two) times daily. 28 packet 0   polyvinyl alcohol (LIQUIFILM TEARS) 1.4 % ophthalmic solution Place 1 drop into both eyes as needed for dry eyes.      sucralfate (CARAFATE) 1 g tablet TAKE 1 TABLET BY MOUTH 4 TIMES DAILY WITH MEALS AND AT BEDTIME 90 tablet 0   traMADol (ULTRAM) 50 MG tablet TAKE 1 TABLET BY MOUTH 3 TIMES DAILY AS NEEDED FOR MODERATE PAIN 90 tablet 0   triamcinolone cream (KENALOG) 0.5 % Apply 1 application topically daily as needed (for skin irritation).  Vitamin D, Ergocalciferol, (DRISDOL) 1.25 MG (50000 UNIT) CAPS capsule TAKE 1 CAPSULE BY MOUTH EVERY 7 DAYS 12 capsule 0   ferrous sulfate (FERROUSUL) 325 (65 FE) MG tablet Take 1 tablet (325 mg total) by mouth 3 (three) times daily with meals for 14 days. (Patient taking differently: Take 325 mg by mouth in the morning and at bedtime.) 90 tablet 2   gabapentin (NEURONTIN) 300 MG capsule TAKE 1 CAPSULE BY MOUTH 3 TIMES DAILY 270 capsule 1   No facility-administered medications prior to visit.     Review of Systems:   Constitutional:   No  weight loss, night sweats,  Fevers, chills, + fatigue, or  lassitude.  HEENT:   No headaches,  Difficulty swallowing,  Tooth/dental problems, or  Sore throat,                No sneezing, itching, ear ache, nasal congestion, post nasal drip,   CV:  No chest pain,  Orthopnea, PND, swelling in lower extremities, anasarca, dizziness, palpitations, syncope.   GI  No heartburn, indigestion, abdominal pain, nausea, vomiting, diarrhea, change in bowel habits, loss of appetite, bloody stools.   Resp:   No chest wall deformity  Skin: no rash or lesions.  GU: no dysuria, change in color of urine, no urgency or frequency.  No flank pain, no hematuria    MS:  No joint pain or swelling.  No decreased range of motion.  No back pain.    Physical Exam  BP (!) 110/50 (BP Location: Left Arm, Patient Position: Sitting, Cuff Size: Normal)   Pulse 81   Temp 98.2 F (36.8 C) (Oral)   Ht 5\' 10"  (1.778 m)   Wt 143 lb 12.8 oz (65.2 kg)   SpO2 97%   BMI 20.63 kg/m   GEN: A/Ox3; pleasant , NAD,  , elderly thin    HEENT:  [place]/AT,, NOSE-clear, THROAT-clear, no lesions, no postnasal drip or exudate noted.   NECK:  Supple w/ fair ROM; no JVD; normal carotid impulses w/o bruits; no thyromegaly or nodules palpated; no lymphadenopathy.    RESP  Clear  P & A; w/o, wheezes/ rales/ or rhonchi. no accessory muscle use, no dullness to percussion  CARD:  RRR, no m/r/g, no peripheral edema, pulses intact, no cyanosis or clubbing.  GI:   Soft & nt; nml bowel sounds; no organomegaly or masses detected.   Musco: Warm bil, no deformities or joint swelling noted.   Neuro: alert, no focal deficits noted.    Skin: Warm, no lesions or rashes    Lab Results:    BMET     Imaging: DG Chest 2 View  Result Date: 10/01/2020 CLINICAL DATA:  Pleural effusion. EXAM: CHEST - 2 VIEW COMPARISON:  September 02, 2020. FINDINGS: The heart size and mediastinal contours are within normal limits. Stable probable loculated left pleural effusion and minimal associated subsegmental atelectasis or scarring. Right lung is clear. No pneumothorax is noted. The visualized skeletal structures are unremarkable. IMPRESSION: Stable probable loculated left pleural effusion and minimal associated subsegmental atelectasis or scarring. Electronically Signed   By: Marijo Conception M.D.   On: 10/01/2020 15:40   DG Chest 2 View  Result Date: 09/02/2020 CLINICAL DATA:  Post thoracentesis EXAM: CHEST - 2 VIEW COMPARISON:  08/15/2020 FINDINGS: Normal heart size, mediastinal contours, and pulmonary vascularity. Atherosclerotic calcification aorta. Minimal marketing changes with small LEFT  pleural effusion decreased from prior study. Minimal LEFT pleural effusion without pneumothorax. Pectus carinatum. Scattered degenerative disc  disease changes thoracic spine. IMPRESSION: Small LEFT pleural effusion and minimal basilar atelectasis. No pneumothorax. Aortic Atherosclerosis (ICD10-I70.0). Electronically Signed   By: Lavonia Dana M.D.   On: 09/02/2020 12:22   NM PET Image Initial (PI) Skull Base To Thigh (F-18 FDG)  Result Date: 09/18/2020 CLINICAL DATA:  Initial treatment strategy for pleural effusion, malignancy suspected. Thoracentesis 09/02/2020 demonstrated lymphocytosis, but no identified carcinoma. History of prostate cancer. EXAM: NUCLEAR MEDICINE PET SKULL BASE TO THIGH TECHNIQUE: 7.12 mCi F-18 FDG was injected intravenously. Full-ring PET imaging was performed from the skull base to thigh after the radiotracer. CT data was obtained and used for attenuation correction and anatomic localization. Fasting blood glucose: 99 mg/dl COMPARISON:  CT chest 07/09/2020 and 08/21/2019. Abdominopelvic CT 06/16/2019. FINDINGS: Mediastinal blood pool activity: SUV max 2.0 NECK: No hypermetabolic cervical lymph nodes are identified.Mildly asymmetric hypermetabolic activity within the right nasopharynx (SUV max 4.9). No corresponding focal abnormality identified on the CT images. No other lesions of the pharyngeal mucosal space. Incidental CT findings: Bilateral carotid atherosclerosis. Unchanged 2.2 cm thyroid nodule involving the left isthmus (image 53/4), evaluated by thyroid ultrasound 07/12/2020. No associated hypermetabolic activity. CHEST: Low level metabolic activity within an AP window node measuring 8 mm on image 65/4 (SUV max 3.0), likely reactive. No suspicious metabolic activity within hilar, mediastinal or axillary lymph nodes. There is no hypermetabolic pulmonary activity or suspicious pulmonary nodularity. Oval subpleural density posteriorly in the superior segment of the left lower lobe  measuring 2.8 cm on image 55/8 is not hypermetabolic (SUV max 1.9), likely rounded atelectasis. There is no significant hypermetabolic activity within the partially loculated left pleural effusion which has decreased in size compared with the prior CT. Incidental CT findings: Sequela of prior granulomatous disease with calcified left hilar lymph nodes and lingular granulomas. Atherosclerosis of the aorta, great vessels and coronary arteries. Mild emphysema. ABDOMEN/PELVIS: There is no hypermetabolic activity within the liver, adrenal glands, spleen or pancreas. There is no hypermetabolic nodal activity. There is mildly prominent focal hypermetabolic activity associated with the distal sigmoid colon (SUV max 6.4). The colonic lumen appears mildly narrowed in this area without gross mass lesion (CT images 159 and 160/4). No other suspicious bowel activity seen. Central activity within the prostate gland is likely urethral. Incidental CT findings: Small hiatal hernia. Diffuse aortic and branch vessel atherosclerosis. Prostate brachytherapy seeds are noted. SKELETON: There is no hypermetabolic activity to suggest osseous metastatic disease. Incidental CT findings: Previous left total hip arthroplasty. IMPRESSION: 1. No suspicious metabolic activity within the thorax to suggest malignancy or metastatic disease. Specifically, no hypermetabolic activity within the residual left pleural effusion or underlying left lung. Low level AP window nodal activity is likely reactive. 2. No suspicious activity within the prostate gland or abdominopelvic lymph nodes. No suspicious osseous findings. 3. Nonspecific activity associated with the distal sigmoid colon, corresponding with an area of luminal narrowing. This could be inflammatory or secondary to prior radiation therapy, although early malignancy cannot be completely excluded and sigmoidoscopy should be considered. No evidence of bowel obstruction. 4. Mild asymmetric metabolic  activity in the right nasopharynx without corresponding CT abnormality. 5. Coronary and Aortic Atherosclerosis (ICD10-I70.0). Electronically Signed   By: Richardean Sale M.D.   On: 09/18/2020 11:01      No flowsheet data found.  No results found for: NITRICOXIDE      Assessment & Plan:   Pleural effusion Recurrent left-sided pleural effusion.  Chest x-ray today shows a stable small loculated left pleural effusion.  Recent PET scan shows a small effusion with no hypermetabolic activity. We went over his PET scan in detail. Patient is continue on his current regimen.  Activity as tolerated.  Plan  Patient Instructions  Please follow up with Dr. Carlean Purl regarding abnormal PET scan for your colon .  Albuterol inhaler As needed   Activity as tolerated. Follow up in office with Dr. Valeta Harms as planned and As needed   Please contact office for sooner follow up if symptoms do not improve or worsen or seek emergency care        Abnormal positron emission tomography (PET) scan PET scan shows some abnormalities in the distal sigmoid colon with some luminal narrowing.  He does follow with GI.  We will send over note and PET scan to Dr. Katha Cabal.  Patient is advised to follow-up with GI to discuss these results.     Rexene Edison, NP 10/01/2020

## 2020-10-01 NOTE — Assessment & Plan Note (Signed)
PET scan shows some abnormalities in the distal sigmoid colon with some luminal narrowing.  He does follow with GI.  We will send over note and PET scan to Dr. Katha Cabal.  Patient is advised to follow-up with GI to discuss these results.

## 2020-10-01 NOTE — Telephone Encounter (Signed)
Pts wife is calling to check on the results of the PET scan.  She specifically would like to know if the PET scan showed the nodule on the left side of the thyroid.  BI please advise. Thanks  He does have a pending appt with BI on 10/12

## 2020-10-01 NOTE — Assessment & Plan Note (Signed)
Recurrent left-sided pleural effusion.  Chest x-ray today shows a stable small loculated left pleural effusion.  Recent PET scan shows a small effusion with no hypermetabolic activity. We went over his PET scan in detail. Patient is continue on his current regimen.  Activity as tolerated.  Plan  Patient Instructions  Please follow up with Dr. Carlean Purl regarding abnormal PET scan for your colon .  Albuterol inhaler As needed   Activity as tolerated. Follow up in office with Dr. Valeta Harms as planned and As needed   Please contact office for sooner follow up if symptoms do not improve or worsen or seek emergency care

## 2020-10-02 NOTE — Telephone Encounter (Signed)
I called and spoke with patient wife [name], who is om DPR, regarding message. I went over DR. Icard recs and joanne verbalized understanding. Nothing further needed.

## 2020-10-02 NOTE — Telephone Encounter (Signed)
I reviewed things with his wife [name].  He is moving his bowels okay.  Appetite and weight loss are still an issue.  He did have radiation for prostate cancer and this mild nonspecific abnormal signal in the sigmoid colon may very well be related to that or mild chronic inflammation associated with diverticulosis.  We will not pursue any type of work-up and I will see him as needed.

## 2020-10-03 ENCOUNTER — Other Ambulatory Visit: Payer: PPO

## 2020-10-04 ENCOUNTER — Telehealth: Payer: Self-pay | Admitting: Adult Health

## 2020-10-04 NOTE — Telephone Encounter (Signed)
I called and spoke with wife [name] who had some questions about Dr. Carlean Purl appt and looks like he had talked to her about results. I just read back what Dr. Carlean Purl had said in his notes. Wife cancelled appt for thyroid as she felt since it showed no cancer, there was no need. I just inform her dr. Valeta Harms still recommended f/u. She has an appt with Dr. Valeta Harms on 10/23/20 and would like to discuss further at that time. Nothing further needed. Will route to [facility] for FYI.  Dr. Valeta Harms, just [facility]. Thanks!

## 2020-10-15 ENCOUNTER — Other Ambulatory Visit (HOSPITAL_COMMUNITY): Payer: Self-pay

## 2020-10-15 ENCOUNTER — Other Ambulatory Visit: Payer: Self-pay | Admitting: Family Medicine

## 2020-10-16 ENCOUNTER — Other Ambulatory Visit (HOSPITAL_COMMUNITY): Payer: Self-pay

## 2020-10-16 MED ORDER — TRAMADOL HCL 50 MG PO TABS
ORAL_TABLET | ORAL | 0 refills | Status: DC
  Filled 2020-10-16 – 2020-10-18 (×2): qty 90, 30d supply, fill #0

## 2020-10-16 NOTE — Telephone Encounter (Signed)
Requesting: tramadol 50mg   Contract: 07/16/2020 UDS: 07/16/2020 Last Visit: 07/16/2020 Next Visit: 10/28/2020 Last Refill: 09/20/2020 #90 and 0RF   Please Advise

## 2020-10-18 ENCOUNTER — Other Ambulatory Visit (HOSPITAL_COMMUNITY): Payer: Self-pay

## 2020-10-21 ENCOUNTER — Other Ambulatory Visit (HOSPITAL_COMMUNITY): Payer: Self-pay

## 2020-10-23 ENCOUNTER — Ambulatory Visit (INDEPENDENT_AMBULATORY_CARE_PROVIDER_SITE_OTHER): Payer: PPO | Admitting: Pulmonary Disease

## 2020-10-23 ENCOUNTER — Telehealth: Payer: Self-pay | Admitting: Pulmonary Disease

## 2020-10-23 ENCOUNTER — Other Ambulatory Visit: Payer: Self-pay

## 2020-10-23 ENCOUNTER — Encounter: Payer: Self-pay | Admitting: Pulmonary Disease

## 2020-10-23 VITALS — BP 126/54 | HR 63 | Temp 97.8°F | Ht 71.0 in | Wt 144.4 lb

## 2020-10-23 DIAGNOSIS — I5032 Chronic diastolic (congestive) heart failure: Secondary | ICD-10-CM | POA: Diagnosis not present

## 2020-10-23 DIAGNOSIS — Z9889 Other specified postprocedural states: Secondary | ICD-10-CM | POA: Diagnosis not present

## 2020-10-23 DIAGNOSIS — J9819 Other pulmonary collapse: Secondary | ICD-10-CM

## 2020-10-23 DIAGNOSIS — J90 Pleural effusion, not elsewhere classified: Secondary | ICD-10-CM

## 2020-10-23 DIAGNOSIS — Z87891 Personal history of nicotine dependence: Secondary | ICD-10-CM

## 2020-10-23 NOTE — Telephone Encounter (Signed)
Patient has been scheduled for pleurx placement on 11/11/20 at 2 pm. Advised to arrive at 1:00pm. Patient was here in the office and given information. Nothing further needed at this time.

## 2020-10-23 NOTE — Patient Instructions (Signed)
Thank you for visiting Dr. Valeta Harms at [facility]. Today we recommend the following:  Orders Placed This Encounter  Procedures   Procedural/ Surgical Case Request: INSERTION PLEURAL DRAINAGE CATHETER   IPC placement on 11/11/2020 Stop eliquis 2 full days prior, last dose 28th for eliquis   Return in about 4 weeks (around 11/20/2020) for with APP or Dr. Valeta Harms. For wound check and suture removal.     Please do your part to reduce the spread of COVID-19.

## 2020-10-23 NOTE — Progress Notes (Signed)
Synopsis: Referred in July 2022 for left-sided pleural effusion by [name], MD  Subjective:   PATIENT ID: [name] GENDER: male DOB: [birth year], MRN: [id number]  Chief Complaint  Patient presents with   Follow-up    87 year old gentleman, past medical history of carotid disease dyslipidemia, GERD, hypertension, prostate cancer.  Patient had a URI type illness several weeks ago.  It took several weeks for him to get better.  He is feeling better now.  Ultimately due to his persistent cough had a CT scan.  CT scan of the chest revealed left-sided unilateral pleural effusion.  Patient was referred for evaluation of this left-sided effusion.  OV 08/30/2020: Here today for evaluation of recurrent pleural effusion.  Patient had effusion tapped in the office last time.  Ultimately had follow-up with Patricia Nettle, NP with repeat chest x-ray.  Effusion had recurred.  We expected this as he did have a area of trapped lung on last x-ray.  However repeat film showed increasing effusion.  Patient was brought back for a repeat thoracentesis.Once again the repeat thoracentesis showed ex vacuo pneumothorax.  Pleural fluid analysis revealed elevated white blood cells, 87% lymphocytes within the pleural fluid.  Cultures negative.  Patient still feels short of breath approximately 2 weeks after every drainage.  He feels more short of breath again today.  They are planning to go out of town next week to the beach.  OV 09/02/2020: I saw patient today in the office. Discussed risks benefits and alternatives. They agreed to proceed today. Eliquis has been held. Last visit was in the hospital for drainage and he had pain. But now doing better.   OV 10/23/2020: Here today for follow-up for recurrent effusion.  He is on Eliquis.  Ultrasound today in the office shows reaccumulation of fluid.  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  Every  time it fills up though he feels short of breath and he does better when it is removed.  He is interested potentially in having a drain placed at some point.    Past Medical History:  Diagnosis Date   Acute bronchitis 06/10/2015   Allergic rhinitis    Anemia    Asthma    Carotid stenosis    a. s/p Left CEA 2002;  b. Carotid US 3/16:  patent R CEA, L < 40%   Chronic pain syndrome    Left shoulder, back   Diverticulosis    Dyslipidemia    Epigastric pain 04/05/2016   Esophageal stricture    Distal, benign   GERD (gastroesophageal reflux disease)    Hemorrhoids    HTN (hypertension)    Negative renal duplex 07-22-11   Hx of echocardiogram    a. Echo 10/11: mod LVH, EF 55-60%   Pleural effusion 07/17/2020   Prostate cancer ([place]) 2000   Seed XRT   Spondylosis, cervical, with myelopathy 11/26/2015   Stroke ([place]) 8/01   right thalamic - on chronic Plavix/ASA     Family History  Problem Relation Age of Onset   Stroke Brother    Stroke Sister    Stroke Mother    Hyperlipidemia Mother    Hypertension Mother    Lung disease Father    Diabetes Son    Gout Son    Obesity Sister    Alcohol abuse Brother    Cancer Brother        lung, smoker  Past Surgical History:  Procedure Laterality Date   APPENDECTOMY  1953   CAROTID ENDARTERECTOMY Right 01/2000   CATARACT EXTRACTION Bilateral    CERVICAL [place] SURGERY  8/07   HIP ARTHROPLASTY Left 10/24/2018   Procedure: ARTHROPLASTY BIPOLAR HIP (HEMIARTHROPLASTY);  Surgeon: Paralee Cancel, MD;  Location: [facility];  Service: Orthopedics;  Laterality: Left;   INSERTION PROSTATE RADIATION SEED  2000   LUMBAR DISC SURGERY  1990s   NASAL SINUS SURGERY     x 3   THORACENTESIS N/A 08/15/2020   Procedure: THORACENTESIS;  Surgeon: [name], DO;  Location: [facility];  Service: Pulmonary;  Laterality: N/A;    Social History   Socioeconomic History   Marital status: Married    Spouse name: Not on file   Number of children: 1    Years of education: 10   Highest education level: Not on file  Occupational History   Occupation: Retired  Tobacco Use   Smoking status: Former    Packs/day: 0.25    Years: 35.00    Pack years: 8.75    Types: Cigarettes    Start date: 1950    Quit date: 01/12/1978    Years since quitting: 42.8   Smokeless tobacco: Current    Types: Chew   Tobacco comments:    Started smoking at age 37  Vaping Use   Vaping Use: Never used  Substance and Sexual Activity   Alcohol use: No    Comment: Prior alcoholic, sober since 7619   Drug use: No   Sexual activity: Not Currently  Other Topics Concern   Not on file  Social History Narrative   Retired lives at home w/ his wife   1 daughter   Right-handed    Caffeine: drinks a lot of coffee    Former smoker, former alcoholic abstinent for many years   Social Determinants of Radio broadcast assistant Strain: Not on file  Food Insecurity: Not on file  Transportation Needs: Not on file  Physical Activity: Not on file  Stress: Not on file  Social Connections: Not on file  Intimate Partner Violence: Not on file     Allergies  Allergen Reactions   Hctz [Hydrochlorothiazide] Shortness Of Breath, Swelling and Other (See Comments)    "Swelling and dyspnea"    Hydralazine Shortness Of Breath and Other (See Comments)    Chest pain and GI issues also   Diovan [Valsartan] Other (See Comments)    Elevated potassium- Hyperkalemia   Metformin And Related Other (See Comments)    Reaction not recalled   Nsaids Other (See Comments)    Other than Tylenol, he isn't to have these because he's on Eliquis   Other Other (See Comments)    Unnamed topically-applied B/P patch = Caused redness   Prednisone Other (See Comments)    Suicidal thoughts   Spironolactone Swelling    Site of swelling not recalled   Codeine Itching and Nausea Only   Oxycodone-Acetaminophen Itching and Nausea Only     Outpatient Medications Prior to Visit  Medication Sig  Dispense Refill   acetaminophen (TYLENOL) 500 MG tablet Take 500 mg by mouth every 6 (six) hours as needed for mild pain or headache.     albuterol (VENTOLIN HFA) 108 (90 Base) MCG/ACT inhaler INHALE 2 PUFFS BY MOUTH INTO THE LUNGS EVERY 6 HOURS AS NEEDED 18 g 0   amLODipine (NORVASC) 5 MG tablet TAKE 2 TABLETS BY MOUTH DAILY (Patient taking differently: Take by mouth  daily.) 180 tablet 2   apixaban (ELIQUIS) 5 MG TABS tablet TAKE 1 TABLET BY MOUTH 2 TIMES DAILY 60 tablet 5   atorvastatin (LIPITOR) 40 MG tablet Take 1 tablet by mouth daily. 90 tablet 1   cloNIDine (CATAPRES) 0.1 MG tablet TAKE 1 TABLET BY MOUTH 2 TIMES DAILY 180 tablet 3   COVID-19 mRNA Vac-TriS, Pfizer, SUSP injection Inject into the muscle. 0.3 mL 0   famotidine (PEPCID) 40 MG tablet Take 1 tablet (40 mg total) by mouth daily. 30 tablet 2   fluticasone (FLOVENT HFA) 110 MCG/ACT inhaler Inhale 2 puffs into the lungs 2 (two) times daily. 1 Inhaler 0   furosemide (LASIX) 40 MG tablet Take 1 tablet (40 mg) by mouth daily alternating with 1/2 tablet (20 mg) every other day 90 tablet 3   lisinopril (ZESTRIL) 2.5 MG tablet TAKE 1 TABLET BY MOUTH DAILY. 90 tablet 1   Menthol-Methyl Salicylate (MUSCLE RUB) 10-15 % CREA Apply 1 application topically 3 (three) times daily with meals. To BLE 85 g 0   metoCLOPramide (REGLAN) 5 MG tablet TAKE 1 TABLET BY MOUTH AT BEDTIME. 30 tablet 5   minoxidil (LONITEN) 10 MG tablet TAKE 1 TABLET BY MOUTH TWICE DAILY 180 tablet 3   ondansetron (ZOFRAN ODT) 8 MG disintegrating tablet Dissolve 1 tablet by mouth every 8 hours as needed for nausea or vomiting. 20 tablet 0   pantoprazole (PROTONIX) 40 MG tablet TAKE 1 TABLET BY MOUTH ONCE A DAY BEFORE BREAKFAST 90 tablet 3   polyethylene glycol (MIRALAX / GLYCOLAX) 17 g packet Take 17 g by mouth 2 (two) times daily. 28 packet 0   polyvinyl alcohol (LIQUIFILM TEARS) 1.4 % ophthalmic solution Place 1 drop into both eyes as needed for dry eyes.      sucralfate  (CARAFATE) 1 g tablet TAKE 1 TABLET BY MOUTH 4 TIMES DAILY WITH MEALS AND AT BEDTIME 90 tablet 0   traMADol (ULTRAM) 50 MG tablet TAKE 1 TABLET BY MOUTH 3 TIMES DAILY AS NEEDED FOR MODERATE PAIN 90 tablet 0   triamcinolone cream (KENALOG) 0.5 % Apply 1 application topically daily as needed (for skin irritation).      Vitamin D, Ergocalciferol, (DRISDOL) 1.25 MG (50000 UNIT) CAPS capsule TAKE 1 CAPSULE BY MOUTH EVERY 7 DAYS 12 capsule 0   ferrous sulfate (FERROUSUL) 325 (65 FE) MG tablet Take 1 tablet (325 mg total) by mouth 3 (three) times daily with meals for 14 days. (Patient taking differently: Take 325 mg by mouth in the morning and at bedtime.) 90 tablet 2   gabapentin (NEURONTIN) 300 MG capsule TAKE 1 CAPSULE BY MOUTH 3 TIMES DAILY 270 capsule 1   No facility-administered medications prior to visit.    Review of Systems  Constitutional:  Negative for chills, fever, malaise/fatigue and weight loss.  HENT:  Negative for hearing loss, sore throat and tinnitus.   Eyes:  Negative for blurred vision and double vision.  Respiratory:  Positive for shortness of breath. Negative for cough, hemoptysis, sputum production, wheezing and stridor.   Cardiovascular:  Negative for chest pain, palpitations, orthopnea, leg swelling and PND.  Gastrointestinal:  Negative for abdominal pain, constipation, diarrhea, heartburn, nausea and vomiting.  Genitourinary:  Negative for dysuria, hematuria and urgency.  Musculoskeletal:  Negative for joint pain and myalgias.  Skin:  Negative for itching and rash.  Neurological:  Negative for dizziness, tingling, weakness and headaches.  Endo/Heme/Allergies:  Negative for environmental allergies. Does not bruise/bleed easily.  Psychiatric/Behavioral:  Negative for  depression. The patient is not nervous/anxious and does not have insomnia.   All other systems reviewed and are negative.   Objective:  Physical Exam Vitals reviewed.  Constitutional:      General: He is  not in acute distress.    Appearance: He is well-developed.  HENT:     Head: Normocephalic and atraumatic.  Eyes:     General: No scleral icterus.    Conjunctiva/sclera: Conjunctivae normal.     Pupils: Pupils are equal, round, and reactive to light.  Neck:     Vascular: No JVD.     Trachea: No tracheal deviation.  Cardiovascular:     Rate and Rhythm: Normal rate and regular rhythm.     Heart sounds: Normal heart sounds. No murmur heard. Pulmonary:     Effort: Pulmonary effort is normal. No tachypnea, accessory muscle usage or respiratory distress.     Breath sounds: No stridor. No wheezing, rhonchi or rales.     Comments: Diminished breath sounds in the left base compared to the right Abdominal:     General: Bowel sounds are normal. There is no distension.     Palpations: Abdomen is soft.     Tenderness: There is no abdominal tenderness.  Musculoskeletal:        General: No tenderness.     Cervical back: Neck supple.  Lymphadenopathy:     Cervical: No cervical adenopathy.  Skin:    General: Skin is warm and dry.     Capillary Refill: Capillary refill takes less than 2 seconds.     Findings: No rash.  Neurological:     Mental Status: He is alert and oriented to person, place, and time.  Psychiatric:        Behavior: Behavior normal.     Vitals:   10/23/20 1434  BP: (!) 126/54  Pulse: 63  Temp: 97.8 F (36.6 C)  TempSrc: Oral  SpO2: 98%  Weight: 144 lb 6.4 oz (65.5 kg)  Height: 5\' 11"  (1.803 m)    98% on RA BMI Readings from Last 3 Encounters:  10/23/20 20.14 kg/m  10/01/20 20.63 kg/m  09/02/20 20.55 kg/m   Wt Readings from Last 3 Encounters:  10/23/20 144 lb 6.4 oz (65.5 kg)  10/01/20 143 lb 12.8 oz (65.2 kg)  09/02/20 143 lb 3.2 oz (65 kg)     CBC    Component Value Date/Time   WBC 8.6 07/16/2020 1659   RBC 3.83 (L) 07/16/2020 1659   HGB 12.1 (L) 07/16/2020 1659   HGB 11.3 (L) 11/23/2019 1209   HCT 35.1 (L) 07/16/2020 1659   HCT 34.7 (L)  11/23/2019 1209   PLT 286.0 07/16/2020 1659   PLT 326 11/23/2019 1209   MCV 91.6 07/16/2020 1659   MCV 94 11/23/2019 1209   MCH 30.5 11/23/2019 1209   MCH 30.4 10/31/2018 0859   MCHC 34.4 07/16/2020 1659   RDW 15.0 07/16/2020 1659   RDW 13.6 11/23/2019 1209   LYMPHSABS 1.0 07/16/2020 1659   MONOABS 0.5 07/16/2020 1659   EOSABS 0.1 07/16/2020 1659   BASOSABS 0.1 07/16/2020 1659    Pleural Fluid Analysis: Results for ARVON, SCHREINER "RAY" (MRN [id number]) as of 08/30/2020 07:41  Ref. Range 08/15/2020 16:57  Albumin, Fluid Latest Units: g/dL 2.3  Fluid Type-FALB Unknown PLEURAL  Cholesterol, Fluid Latest Units: mg/dL 50  Chol, Fluid Type Unknown PLEURAL  Glucose, Fluid Latest Units: mg/dL 110  Fluid Type-FGLU Unknown PLEURAL  Fluid Type-FLDH Unknown PLEURAL  LD, Fluid Latest  Ref Range: 3 - 23 U/L 158 (H)  Total protein, fluid Latest Units: g/dL 3.7  Triglycerides, Fluid Latest Ref Range: Not Estab. mg/dL 18  Fluid Type-FCT Unknown PLEURAL  Fluid Type-FTP Unknown PLEURAL  Fluid Type-FTRIG Unknown PLEURAL  Color, Fluid Latest Ref Range: YELLOW  YELLOW (A)  Total Nucleated Cell Count, Fluid Latest Ref Range: 0 - 1,000 cu mm 792  Lymphs, Fluid Latest Units: % 87  Eos, Fluid Latest Units: % 11  Appearance, Fluid Latest Ref Range: CLEAR  CLEAR  Neutrophil Count, Fluid Latest Ref Range: 0 - 25 % 1  Monocyte-Macrophage-Serous Fluid Latest Ref Range: 50 - 90 % 1 (L)    Chest Imaging: Chest x-ray today following thoracentesis: Post thoracentesis ex vacuo pneumothorax  CT chest 07/09/2020: Left-sided pleural effusion.  Nothing significantly abnormal within the lung parenchyma. The patient's images have been independently reviewed by me.    Pulmonary Functions Testing Results: No flowsheet data found.  FeNO:   Pathology:   Echocardiogram:   Heart Catheterization:     Assessment & Plan:     ICD-10-CM   1. 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 Former smoker  Z87.891     4. Chronic diastolic congestive heart failure (HCC)  I50.32     5. S/P thoracentesis  Z98.890       Discussion:  This is an 87 year old gentleman, left-sided unilateral effusion status post 3 thoracentesis, lymphocyte predominant, cultures negative cytology negative no malignancy.  PET scan which was relatively unrevealing except for a hypermetabolic faint spot in the sigmoid.  Chest x-ray with ex vacuo pneumothorax after tapping.  Plan: Today we talked about various options again on managing recurrent effusion especially in a loculated hydropneumothorax. 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  He is not a surgical candidate due to his age. I think we have settled on placement of an indwelling pleural catheter. This way he can keep it dry at home hopefully it will auto pleurodesis.  This occurs upwards of 50 to 60% of the time if we can keep the space dry. Patient is agreeable to this plan.  He is going out of town this weekend and would like to get it done in the next few weeks. Tentatively scheduled for 11/12/2018 2 in the afternoon at [facility] endoscopy.    Current Outpatient Medications:    acetaminophen (TYLENOL) 500 MG tablet, Take 500 mg by mouth every 6 (six) hours as needed for mild pain or headache., Disp: , Rfl:    albuterol (VENTOLIN HFA) 108 (90 Base) MCG/ACT inhaler, INHALE 2 PUFFS BY MOUTH INTO THE LUNGS EVERY 6 HOURS AS NEEDED, Disp: 18 g, Rfl: 0   amLODipine (NORVASC) 5 MG tablet, TAKE 2 TABLETS BY MOUTH DAILY (Patient taking differently: Take by mouth daily.), Disp: 180 tablet, Rfl: 2   apixaban (ELIQUIS) 5 MG TABS tablet, TAKE 1 TABLET BY MOUTH 2 TIMES DAILY, Disp: 60 tablet, Rfl: 5   atorvastatin (LIPITOR) 40 MG tablet, Take 1 tablet by mouth daily., Disp: 90 tablet, Rfl: 1   cloNIDine (CATAPRES) 0.1 MG tablet, TAKE 1 TABLET BY MOUTH 2 TIMES  DAILY, Disp: 180 tablet, Rfl: 3   COVID-19 mRNA Vac-TriS, Pfizer, SUSP injection, Inject into the muscle., Disp: 0.3 mL, Rfl: 0   famotidine (PEPCID) 40 MG tablet, Take 1 tablet (40 mg total) by  mouth daily., Disp: 30 tablet, Rfl: 2   fluticasone (FLOVENT HFA) 110 MCG/ACT inhaler, Inhale 2 puffs into the lungs 2 (two) times daily., Disp: 1 Inhaler, Rfl: 0   furosemide (LASIX) 40 MG tablet, Take 1 tablet (40 mg) by mouth daily alternating with 1/2 tablet (20 mg) every other day, Disp: 90 tablet, Rfl: 3   lisinopril (ZESTRIL) 2.5 MG tablet, TAKE 1 TABLET BY MOUTH DAILY., Disp: 90 tablet, Rfl: 1   Menthol-Methyl Salicylate (MUSCLE RUB) 10-15 % CREA, Apply 1 application topically 3 (three) times daily with meals. To BLE, Disp: 85 g, Rfl: 0   metoCLOPramide (REGLAN) 5 MG tablet, TAKE 1 TABLET BY MOUTH AT BEDTIME., Disp: 30 tablet, Rfl: 5   minoxidil (LONITEN) 10 MG tablet, TAKE 1 TABLET BY MOUTH TWICE DAILY, Disp: 180 tablet, Rfl: 3   ondansetron (ZOFRAN ODT) 8 MG disintegrating tablet, Dissolve 1 tablet by mouth every 8 hours as needed for nausea or vomiting., Disp: 20 tablet, Rfl: 0   pantoprazole (PROTONIX) 40 MG tablet, TAKE 1 TABLET BY MOUTH ONCE A DAY BEFORE BREAKFAST, Disp: 90 tablet, Rfl: 3   polyethylene glycol (MIRALAX / GLYCOLAX) 17 g packet, Take 17 g by mouth 2 (two) times daily., Disp: 28 packet, Rfl: 0   polyvinyl alcohol (LIQUIFILM TEARS) 1.4 % ophthalmic solution, Place 1 drop into both eyes as needed for dry eyes. , Disp: , Rfl:    sucralfate (CARAFATE) 1 g tablet, TAKE 1 TABLET BY MOUTH 4 TIMES DAILY WITH MEALS AND AT BEDTIME, Disp: 90 tablet, Rfl: 0   traMADol (ULTRAM) 50 MG tablet, TAKE 1 TABLET BY MOUTH 3 TIMES DAILY AS NEEDED FOR MODERATE PAIN, Disp: 90 tablet, Rfl: 0   triamcinolone cream (KENALOG) 0.5 %, Apply 1 application topically daily as needed (for skin irritation). , Disp: , Rfl:    Vitamin D, Ergocalciferol, (DRISDOL) 1.25 MG (50000 UNIT) CAPS capsule, TAKE 1 CAPSULE BY  MOUTH EVERY 7 DAYS, Disp: 12 capsule, Rfl: 0   ferrous sulfate (FERROUSUL) 325 (65 FE) MG tablet, Take 1 tablet (325 mg total) by mouth 3 (three) times daily with meals for 14 days. (Patient taking differently: Take 325 mg by mouth in the morning and at bedtime.), Disp: 90 tablet, Rfl: 2   gabapentin (NEURONTIN) 300 MG capsule, TAKE 1 CAPSULE BY MOUTH 3 TIMES DAILY, Disp: 270 capsule, Rfl: 1   [name], DO [facility] 10/23/2020 2:47 PM

## 2020-10-23 NOTE — H&P (View-Only) (Signed)
Synopsis: Referred in July 2022 for left-sided pleural effusion by [name], MD  Subjective:   PATIENT ID: [name] GENDER: male DOB: Nov 29, 1929, MRN: [id number]  Chief Complaint  Patient presents with   Follow-up    87 year old gentleman, past medical history of carotid disease dyslipidemia, GERD, hypertension, prostate cancer.  Patient had a URI type illness several weeks ago.  It took several weeks for him to get better.  He is feeling better now.  Ultimately due to his persistent cough had a CT scan.  CT scan of the chest revealed left-sided unilateral pleural effusion.  Patient was referred for evaluation of this left-sided effusion.  OV 08/30/2020: Here today for evaluation of recurrent pleural effusion.  Patient had effusion tapped in the office last time.  Ultimately had follow-up with Patricia Nettle, NP with repeat chest x-ray.  Effusion had recurred.  We expected this as he did have a area of trapped lung on last x-ray.  However repeat film showed increasing effusion.  Patient was brought back for a repeat thoracentesis.Once again the repeat thoracentesis showed ex vacuo pneumothorax.  Pleural fluid analysis revealed elevated white blood cells, 87% lymphocytes within the pleural fluid.  Cultures negative.  Patient still feels short of breath approximately 2 weeks after every drainage.  He feels more short of breath again today.  They are planning to go out of town next week to the beach.  OV 09/02/2020: I saw patient today in the office. Discussed risks benefits and alternatives. They agreed to proceed today. Eliquis has been held. Last visit was in the hospital for drainage and he had pain. But now doing better.   OV 10/23/2020: Here today for follow-up for recurrent effusion.  He is on Eliquis.  Ultrasound today in the office shows reaccumulation of fluid.  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  Every  time it fills up though he feels short of breath and he does better when it is removed.  He is interested potentially in having a drain placed at some point.    Past Medical History:  Diagnosis Date   Acute bronchitis 06/10/2015   Allergic rhinitis    Anemia    Asthma    Carotid stenosis    a. s/p Left CEA 2002;  b. Carotid US 3/16:  patent R CEA, L < 40%   Chronic pain syndrome    Left shoulder, back   Diverticulosis    Dyslipidemia    Epigastric pain 04/05/2016   Esophageal stricture    Distal, benign   GERD (gastroesophageal reflux disease)    Hemorrhoids    HTN (hypertension)    Negative renal duplex 07-22-11   Hx of echocardiogram    a. Echo 10/11: mod LVH, EF 55-60%   Pleural effusion 07/17/2020   Prostate cancer ([place]) 2000   Seed XRT   Spondylosis, cervical, with myelopathy 11/26/2015   Stroke ([place]) 8/01   right thalamic - on chronic Plavix/ASA     Family History  Problem Relation Age of Onset   Stroke Brother    Stroke Sister    Stroke Mother    Hyperlipidemia Mother    Hypertension Mother    Lung disease Father    Diabetes Son    Gout Son    Obesity Sister    Alcohol abuse Brother    Cancer Brother        lung, smoker  Past Surgical History:  Procedure Laterality Date   APPENDECTOMY  1953   CAROTID ENDARTERECTOMY Right 01/2000   CATARACT EXTRACTION Bilateral    CERVICAL Dent SURGERY  8/07   HIP ARTHROPLASTY Left 10/24/2018   Procedure: ARTHROPLASTY BIPOLAR HIP (HEMIARTHROPLASTY);  Surgeon: Paralee Cancel, MD;  Location: [facility];  Service: Orthopedics;  Laterality: Left;   INSERTION PROSTATE RADIATION SEED  2000   LUMBAR DISC SURGERY  1990s   NASAL SINUS SURGERY     x 3   THORACENTESIS N/A 08/15/2020   Procedure: THORACENTESIS;  Surgeon: [name], DO;  Location: [facility];  Service: Pulmonary;  Laterality: N/A;    Social History   Socioeconomic History   Marital status: Married    Spouse name: Not on file   Number of children: 1    Years of education: 10   Highest education level: Not on file  Occupational History   Occupation: Retired  Tobacco Use   Smoking status: Former    Packs/day: 0.25    Years: 35.00    Pack years: 8.75    Types: Cigarettes    Start date: 1950    Quit date: 01/12/1978    Years since quitting: 42.8   Smokeless tobacco: Current    Types: Chew   Tobacco comments:    Started smoking at age 62  Vaping Use   Vaping Use: Never used  Substance and Sexual Activity   Alcohol use: No    Comment: Prior alcoholic, sober since 1610   Drug use: No   Sexual activity: Not Currently  Other Topics Concern   Not on file  Social History Narrative   Retired lives at home w/ his wife   1 daughter   Right-handed    Caffeine: drinks a lot of coffee    Former smoker, former alcoholic abstinent for many years   Social Determinants of Radio broadcast assistant Strain: Not on file  Food Insecurity: Not on file  Transportation Needs: Not on file  Physical Activity: Not on file  Stress: Not on file  Social Connections: Not on file  Intimate Partner Violence: Not on file     Allergies  Allergen Reactions   Hctz [Hydrochlorothiazide] Shortness Of Breath, Swelling and Other (See Comments)    "Swelling and dyspnea"    Hydralazine Shortness Of Breath and Other (See Comments)    Chest pain and GI issues also   Diovan [Valsartan] Other (See Comments)    Elevated potassium- Hyperkalemia   Metformin And Related Other (See Comments)    Reaction not recalled   Nsaids Other (See Comments)    Other than Tylenol, he isn't to have these because he's on Eliquis   Other Other (See Comments)    Unnamed topically-applied B/P patch = Caused redness   Prednisone Other (See Comments)    Suicidal thoughts   Spironolactone Swelling    Site of swelling not recalled   Codeine Itching and Nausea Only   Oxycodone-Acetaminophen Itching and Nausea Only     Outpatient Medications Prior to Visit  Medication Sig  Dispense Refill   acetaminophen (TYLENOL) 500 MG tablet Take 500 mg by mouth every 6 (six) hours as needed for mild pain or headache.     albuterol (VENTOLIN HFA) 108 (90 Base) MCG/ACT inhaler INHALE 2 PUFFS BY MOUTH INTO THE LUNGS EVERY 6 HOURS AS NEEDED 18 g 0   amLODipine (NORVASC) 5 MG tablet TAKE 2 TABLETS BY MOUTH DAILY (Patient taking differently: Take by mouth  daily.) 180 tablet 2   apixaban (ELIQUIS) 5 MG TABS tablet TAKE 1 TABLET BY MOUTH 2 TIMES DAILY 60 tablet 5   atorvastatin (LIPITOR) 40 MG tablet Take 1 tablet by mouth daily. 90 tablet 1   cloNIDine (CATAPRES) 0.1 MG tablet TAKE 1 TABLET BY MOUTH 2 TIMES DAILY 180 tablet 3   COVID-19 mRNA Vac-TriS, Pfizer, SUSP injection Inject into the muscle. 0.3 mL 0   famotidine (PEPCID) 40 MG tablet Take 1 tablet (40 mg total) by mouth daily. 30 tablet 2   fluticasone (FLOVENT HFA) 110 MCG/ACT inhaler Inhale 2 puffs into the lungs 2 (two) times daily. 1 Inhaler 0   furosemide (LASIX) 40 MG tablet Take 1 tablet (40 mg) by mouth daily alternating with 1/2 tablet (20 mg) every other day 90 tablet 3   lisinopril (ZESTRIL) 2.5 MG tablet TAKE 1 TABLET BY MOUTH DAILY. 90 tablet 1   Menthol-Methyl Salicylate (MUSCLE RUB) 10-15 % CREA Apply 1 application topically 3 (three) times daily with meals. To BLE 85 g 0   metoCLOPramide (REGLAN) 5 MG tablet TAKE 1 TABLET BY MOUTH AT BEDTIME. 30 tablet 5   minoxidil (LONITEN) 10 MG tablet TAKE 1 TABLET BY MOUTH TWICE DAILY 180 tablet 3   ondansetron (ZOFRAN ODT) 8 MG disintegrating tablet Dissolve 1 tablet by mouth every 8 hours as needed for nausea or vomiting. 20 tablet 0   pantoprazole (PROTONIX) 40 MG tablet TAKE 1 TABLET BY MOUTH ONCE A DAY BEFORE BREAKFAST 90 tablet 3   polyethylene glycol (MIRALAX / GLYCOLAX) 17 g packet Take 17 g by mouth 2 (two) times daily. 28 packet 0   polyvinyl alcohol (LIQUIFILM TEARS) 1.4 % ophthalmic solution Place 1 drop into both eyes as needed for dry eyes.      sucralfate  (CARAFATE) 1 g tablet TAKE 1 TABLET BY MOUTH 4 TIMES DAILY WITH MEALS AND AT BEDTIME 90 tablet 0   traMADol (ULTRAM) 50 MG tablet TAKE 1 TABLET BY MOUTH 3 TIMES DAILY AS NEEDED FOR MODERATE PAIN 90 tablet 0   triamcinolone cream (KENALOG) 0.5 % Apply 1 application topically daily as needed (for skin irritation).      Vitamin D, Ergocalciferol, (DRISDOL) 1.25 MG (50000 UNIT) CAPS capsule TAKE 1 CAPSULE BY MOUTH EVERY 7 DAYS 12 capsule 0   ferrous sulfate (FERROUSUL) 325 (65 FE) MG tablet Take 1 tablet (325 mg total) by mouth 3 (three) times daily with meals for 14 days. (Patient taking differently: Take 325 mg by mouth in the morning and at bedtime.) 90 tablet 2   gabapentin (NEURONTIN) 300 MG capsule TAKE 1 CAPSULE BY MOUTH 3 TIMES DAILY 270 capsule 1   No facility-administered medications prior to visit.    Review of Systems  Constitutional:  Negative for chills, fever, malaise/fatigue and weight loss.  HENT:  Negative for hearing loss, sore throat and tinnitus.   Eyes:  Negative for blurred vision and double vision.  Respiratory:  Positive for shortness of breath. Negative for cough, hemoptysis, sputum production, wheezing and stridor.   Cardiovascular:  Negative for chest pain, palpitations, orthopnea, leg swelling and PND.  Gastrointestinal:  Negative for abdominal pain, constipation, diarrhea, heartburn, nausea and vomiting.  Genitourinary:  Negative for dysuria, hematuria and urgency.  Musculoskeletal:  Negative for joint pain and myalgias.  Skin:  Negative for itching and rash.  Neurological:  Negative for dizziness, tingling, weakness and headaches.  Endo/Heme/Allergies:  Negative for environmental allergies. Does not bruise/bleed easily.  Psychiatric/Behavioral:  Negative for  depression. The patient is not nervous/anxious and does not have insomnia.   All other systems reviewed and are negative.   Objective:  Physical Exam Vitals reviewed.  Constitutional:      General: He is  not in acute distress.    Appearance: He is well-developed.  HENT:     Head: Normocephalic and atraumatic.  Eyes:     General: No scleral icterus.    Conjunctiva/sclera: Conjunctivae normal.     Pupils: Pupils are equal, round, and reactive to light.  Neck:     Vascular: No JVD.     Trachea: No tracheal deviation.  Cardiovascular:     Rate and Rhythm: Normal rate and regular rhythm.     Heart sounds: Normal heart sounds. No murmur heard. Pulmonary:     Effort: Pulmonary effort is normal. No tachypnea, accessory muscle usage or respiratory distress.     Breath sounds: No stridor. No wheezing, rhonchi or rales.     Comments: Diminished breath sounds in the left base compared to the right Abdominal:     General: Bowel sounds are normal. There is no distension.     Palpations: Abdomen is soft.     Tenderness: There is no abdominal tenderness.  Musculoskeletal:        General: No tenderness.     Cervical back: Neck supple.  Lymphadenopathy:     Cervical: No cervical adenopathy.  Skin:    General: Skin is warm and dry.     Capillary Refill: Capillary refill takes less than 2 seconds.     Findings: No rash.  Neurological:     Mental Status: He is alert and oriented to person, place, and time.  Psychiatric:        Behavior: Behavior normal.     Vitals:   10/23/20 1434  BP: (!) 126/54  Pulse: 63  Temp: 97.8 F (36.6 C)  TempSrc: Oral  SpO2: 98%  Weight: 144 lb 6.4 oz (65.5 kg)  Height: 5\' 11"  (1.803 m)    98% on RA BMI Readings from Last 3 Encounters:  10/23/20 20.14 kg/m  10/01/20 20.63 kg/m  09/02/20 20.55 kg/m   Wt Readings from Last 3 Encounters:  10/23/20 144 lb 6.4 oz (65.5 kg)  10/01/20 143 lb 12.8 oz (65.2 kg)  09/02/20 143 lb 3.2 oz (65 kg)     CBC    Component Value Date/Time   WBC 8.6 07/16/2020 1659   RBC 3.83 (L) 07/16/2020 1659   HGB 12.1 (L) 07/16/2020 1659   HGB 11.3 (L) 11/23/2019 1209   HCT 35.1 (L) 07/16/2020 1659   HCT 34.7 (L)  11/23/2019 1209   PLT 286.0 07/16/2020 1659   PLT 326 11/23/2019 1209   MCV 91.6 07/16/2020 1659   MCV 94 11/23/2019 1209   MCH 30.5 11/23/2019 1209   MCH 30.4 10/31/2018 0859   MCHC 34.4 07/16/2020 1659   RDW 15.0 07/16/2020 1659   RDW 13.6 11/23/2019 1209   LYMPHSABS 1.0 07/16/2020 1659   MONOABS 0.5 07/16/2020 1659   EOSABS 0.1 07/16/2020 1659   BASOSABS 0.1 07/16/2020 1659    Pleural Fluid Analysis: Results for MOHSEN, ODENTHAL "RAY" (MRN [id number]) as of 08/30/2020 07:41  Ref. Range 08/15/2020 16:57  Albumin, Fluid Latest Units: g/dL 2.3  Fluid Type-FALB Unknown PLEURAL  Cholesterol, Fluid Latest Units: mg/dL 50  Chol, Fluid Type Unknown PLEURAL  Glucose, Fluid Latest Units: mg/dL 110  Fluid Type-FGLU Unknown PLEURAL  Fluid Type-FLDH Unknown PLEURAL  LD, Fluid Latest  Ref Range: 3 - 23 U/L 158 (H)  Total protein, fluid Latest Units: g/dL 3.7  Triglycerides, Fluid Latest Ref Range: Not Estab. mg/dL 18  Fluid Type-FCT Unknown PLEURAL  Fluid Type-FTP Unknown PLEURAL  Fluid Type-FTRIG Unknown PLEURAL  Color, Fluid Latest Ref Range: YELLOW  YELLOW (A)  Total Nucleated Cell Count, Fluid Latest Ref Range: 0 - 1,000 cu mm 792  Lymphs, Fluid Latest Units: % 87  Eos, Fluid Latest Units: % 11  Appearance, Fluid Latest Ref Range: CLEAR  CLEAR  Neutrophil Count, Fluid Latest Ref Range: 0 - 25 % 1  Monocyte-Macrophage-Serous Fluid Latest Ref Range: 50 - 90 % 1 (L)    Chest Imaging: Chest x-ray today following thoracentesis: Post thoracentesis ex vacuo pneumothorax  CT chest 07/09/2020: Left-sided pleural effusion.  Nothing significantly abnormal within the lung parenchyma. The patient's images have been independently reviewed by me.    Pulmonary Functions Testing Results: No flowsheet data found.  FeNO:   Pathology:   Echocardiogram:   Heart Catheterization:     Assessment & Plan:     ICD-10-CM   1. 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 Former smoker  Z87.891     4. Chronic diastolic congestive heart failure (HCC)  I50.32     5. S/P thoracentesis  Z98.890       Discussion:  This is an 87 year old gentleman, left-sided unilateral effusion status post 3 thoracentesis, lymphocyte predominant, cultures negative cytology negative no malignancy.  PET scan which was relatively unrevealing except for a hypermetabolic faint spot in the sigmoid.  Chest x-ray with ex vacuo pneumothorax after tapping.  Plan: Today we talked about various options again on managing recurrent effusion especially in a loculated hydropneumothorax. 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  He is not a surgical candidate due to his age. I think we have settled on placement of an indwelling pleural catheter. This way he can keep it dry at home hopefully it will auto pleurodesis.  This occurs upwards of 50 to 60% of the time if we can keep the space dry. Patient is agreeable to this plan.  He is going out of town this weekend and would like to get it done in the next few weeks. Tentatively scheduled for 11/12/2018 2 in the afternoon at [facility] endoscopy.    Current Outpatient Medications:    acetaminophen (TYLENOL) 500 MG tablet, Take 500 mg by mouth every 6 (six) hours as needed for mild pain or headache., Disp: , Rfl:    albuterol (VENTOLIN HFA) 108 (90 Base) MCG/ACT inhaler, INHALE 2 PUFFS BY MOUTH INTO THE LUNGS EVERY 6 HOURS AS NEEDED, Disp: 18 g, Rfl: 0   amLODipine (NORVASC) 5 MG tablet, TAKE 2 TABLETS BY MOUTH DAILY (Patient taking differently: Take by mouth daily.), Disp: 180 tablet, Rfl: 2   apixaban (ELIQUIS) 5 MG TABS tablet, TAKE 1 TABLET BY MOUTH 2 TIMES DAILY, Disp: 60 tablet, Rfl: 5   atorvastatin (LIPITOR) 40 MG tablet, Take 1 tablet by mouth daily., Disp: 90 tablet, Rfl: 1   cloNIDine (CATAPRES) 0.1 MG tablet, TAKE 1 TABLET BY MOUTH 2 TIMES  DAILY, Disp: 180 tablet, Rfl: 3   COVID-19 mRNA Vac-TriS, Pfizer, SUSP injection, Inject into the muscle., Disp: 0.3 mL, Rfl: 0   famotidine (PEPCID) 40 MG tablet, Take 1 tablet (40 mg total) by  mouth daily., Disp: 30 tablet, Rfl: 2   fluticasone (FLOVENT HFA) 110 MCG/ACT inhaler, Inhale 2 puffs into the lungs 2 (two) times daily., Disp: 1 Inhaler, Rfl: 0   furosemide (LASIX) 40 MG tablet, Take 1 tablet (40 mg) by mouth daily alternating with 1/2 tablet (20 mg) every other day, Disp: 90 tablet, Rfl: 3   lisinopril (ZESTRIL) 2.5 MG tablet, TAKE 1 TABLET BY MOUTH DAILY., Disp: 90 tablet, Rfl: 1   Menthol-Methyl Salicylate (MUSCLE RUB) 10-15 % CREA, Apply 1 application topically 3 (three) times daily with meals. To BLE, Disp: 85 g, Rfl: 0   metoCLOPramide (REGLAN) 5 MG tablet, TAKE 1 TABLET BY MOUTH AT BEDTIME., Disp: 30 tablet, Rfl: 5   minoxidil (LONITEN) 10 MG tablet, TAKE 1 TABLET BY MOUTH TWICE DAILY, Disp: 180 tablet, Rfl: 3   ondansetron (ZOFRAN ODT) 8 MG disintegrating tablet, Dissolve 1 tablet by mouth every 8 hours as needed for nausea or vomiting., Disp: 20 tablet, Rfl: 0   pantoprazole (PROTONIX) 40 MG tablet, TAKE 1 TABLET BY MOUTH ONCE A DAY BEFORE BREAKFAST, Disp: 90 tablet, Rfl: 3   polyethylene glycol (MIRALAX / GLYCOLAX) 17 g packet, Take 17 g by mouth 2 (two) times daily., Disp: 28 packet, Rfl: 0   polyvinyl alcohol (LIQUIFILM TEARS) 1.4 % ophthalmic solution, Place 1 drop into both eyes as needed for dry eyes. , Disp: , Rfl:    sucralfate (CARAFATE) 1 g tablet, TAKE 1 TABLET BY MOUTH 4 TIMES DAILY WITH MEALS AND AT BEDTIME, Disp: 90 tablet, Rfl: 0   traMADol (ULTRAM) 50 MG tablet, TAKE 1 TABLET BY MOUTH 3 TIMES DAILY AS NEEDED FOR MODERATE PAIN, Disp: 90 tablet, Rfl: 0   triamcinolone cream (KENALOG) 0.5 %, Apply 1 application topically daily as needed (for skin irritation). , Disp: , Rfl:    Vitamin D, Ergocalciferol, (DRISDOL) 1.25 MG (50000 UNIT) CAPS capsule, TAKE 1 CAPSULE BY  MOUTH EVERY 7 DAYS, Disp: 12 capsule, Rfl: 0   ferrous sulfate (FERROUSUL) 325 (65 FE) MG tablet, Take 1 tablet (325 mg total) by mouth 3 (three) times daily with meals for 14 days. (Patient taking differently: Take 325 mg by mouth in the morning and at bedtime.), Disp: 90 tablet, Rfl: 2   gabapentin (NEURONTIN) 300 MG capsule, TAKE 1 CAPSULE BY MOUTH 3 TIMES DAILY, Disp: 270 capsule, Rfl: 1   [name], DO [facility] 10/23/2020 2:47 PM

## 2020-10-23 NOTE — Telephone Encounter (Signed)
Schedule patient for Pleurx insertion on 11/11/2020 at [facility] per Dr. Valeta Harms Stop Eliquis 2 full days prior, last done would be on 10/28 for Eliquis

## 2020-10-28 ENCOUNTER — Ambulatory Visit: Payer: BC Managed Care – PPO | Admitting: Family Medicine

## 2020-11-05 ENCOUNTER — Other Ambulatory Visit (HOSPITAL_COMMUNITY): Payer: Self-pay

## 2020-11-05 ENCOUNTER — Other Ambulatory Visit: Payer: Self-pay | Admitting: Internal Medicine

## 2020-11-06 ENCOUNTER — Other Ambulatory Visit (HOSPITAL_COMMUNITY): Payer: Self-pay

## 2020-11-06 MED ORDER — APIXABAN 5 MG PO TABS
ORAL_TABLET | Freq: Two times a day (BID) | ORAL | 5 refills | Status: DC
  Filled 2020-11-06: qty 60, 30d supply, fill #0
  Filled 2020-12-07: qty 60, 30d supply, fill #1
  Filled 2021-01-13: qty 60, 30d supply, fill #2
  Filled 2021-02-10: qty 60, 30d supply, fill #3
  Filled 2021-03-12: qty 60, 30d supply, fill #4
  Filled 2021-04-21: qty 60, 30d supply, fill #5

## 2020-11-06 NOTE — Telephone Encounter (Signed)
Prescription refill request for Eliquis received. Indication: Afib  Last office visit: 08/27/20 Harrington Challenger)  Scr: 1.30 (09/17/20) Age: 87 Weight: 65.5kg  Appropriate dose and refill sent to requested pharmacy.

## 2020-11-07 ENCOUNTER — Other Ambulatory Visit (HOSPITAL_COMMUNITY): Payer: Self-pay

## 2020-11-08 ENCOUNTER — Other Ambulatory Visit (HOSPITAL_COMMUNITY): Payer: Self-pay

## 2020-11-11 ENCOUNTER — Ambulatory Visit (HOSPITAL_COMMUNITY): Payer: PPO

## 2020-11-11 ENCOUNTER — Telehealth: Payer: Self-pay | Admitting: Pulmonary Disease

## 2020-11-11 ENCOUNTER — Encounter (HOSPITAL_COMMUNITY)
Admission: RE | Disposition: A | Discharge status: Home / Self Care | Payer: Self-pay | Source: Home / Self Care | Attending: Pulmonary Disease

## 2020-11-11 ENCOUNTER — Ambulatory Visit (HOSPITAL_COMMUNITY)
Admission: RE | Admit: 2020-11-11 | Discharge: 2020-11-11 | Disposition: A | Discharge status: Home / Self Care | Payer: PPO | Attending: Pulmonary Disease | Admitting: Pulmonary Disease

## 2020-11-11 ENCOUNTER — Other Ambulatory Visit: Payer: Self-pay

## 2020-11-11 DIAGNOSIS — I2510 Atherosclerotic heart disease of native coronary artery without angina pectoris: Secondary | ICD-10-CM | POA: Insufficient documentation

## 2020-11-11 DIAGNOSIS — Z87891 Personal history of nicotine dependence: Secondary | ICD-10-CM | POA: Diagnosis not present

## 2020-11-11 DIAGNOSIS — Z8546 Personal history of malignant neoplasm of prostate: Secondary | ICD-10-CM | POA: Diagnosis not present

## 2020-11-11 DIAGNOSIS — Z4682 Encounter for fitting and adjustment of non-vascular catheter: Secondary | ICD-10-CM | POA: Diagnosis not present

## 2020-11-11 DIAGNOSIS — Z8249 Family history of ischemic heart disease and other diseases of the circulatory system: Secondary | ICD-10-CM | POA: Diagnosis not present

## 2020-11-11 DIAGNOSIS — E785 Hyperlipidemia, unspecified: Secondary | ICD-10-CM | POA: Insufficient documentation

## 2020-11-11 DIAGNOSIS — Z9689 Presence of other specified functional implants: Secondary | ICD-10-CM

## 2020-11-11 DIAGNOSIS — Z801 Family history of malignant neoplasm of trachea, bronchus and lung: Secondary | ICD-10-CM | POA: Insufficient documentation

## 2020-11-11 DIAGNOSIS — Z833 Family history of diabetes mellitus: Secondary | ICD-10-CM | POA: Diagnosis not present

## 2020-11-11 DIAGNOSIS — J90 Pleural effusion, not elsewhere classified: Secondary | ICD-10-CM

## 2020-11-11 DIAGNOSIS — K219 Gastro-esophageal reflux disease without esophagitis: Secondary | ICD-10-CM | POA: Diagnosis not present

## 2020-11-11 DIAGNOSIS — J939 Pneumothorax, unspecified: Secondary | ICD-10-CM | POA: Diagnosis not present

## 2020-11-11 DIAGNOSIS — Z836 Family history of other diseases of the respiratory system: Secondary | ICD-10-CM | POA: Diagnosis not present

## 2020-11-11 DIAGNOSIS — R0600 Dyspnea, unspecified: Secondary | ICD-10-CM | POA: Diagnosis present

## 2020-11-11 DIAGNOSIS — R091 Pleurisy: Secondary | ICD-10-CM | POA: Diagnosis not present

## 2020-11-11 DIAGNOSIS — R0609 Other forms of dyspnea: Secondary | ICD-10-CM | POA: Diagnosis not present

## 2020-11-11 HISTORY — PX: CHEST TUBE INSERTION: SHX231

## 2020-11-11 SURGERY — INSERTION, PLEURAL DRAINAGE CATHETER
Laterality: Left

## 2020-11-11 SURGERY — INSERTION, PLEURAL DRAINAGE CATHETER

## 2020-11-11 NOTE — Interval H&P Note (Signed)
History and Physical Interval Note:  11/11/2020 2:33 PM  [name]  has presented today for surgery, with the diagnosis of Pleural effusion.  The various methods of treatment have been discussed with the patient and family. After consideration of risks, benefits and other options for treatment, the patient has consented to  Procedure(s) with comments: INSERTION PLEURAL DRAINAGE CATHETER (Left) - Indwelling Pleural Catheter as a surgical intervention.  The patient's history has been reviewed, patient examined, no change in status, stable for surgery.  I have reviewed the patient's chart and labs.  Questions were answered to the patient's satisfaction.     Fults

## 2020-11-11 NOTE — Op Note (Signed)
Left PleurX Insertion Procedure Note  [name]  [id number]  [birth year]  Date:11/11/20  Time:3:06 PM   Provider Performing:[name]  Procedure: PleurX Tunneled Pleural Catheter Placement (02334)  Indication(s) Relief of dyspnea from recurrent effusion  Consent Risks of the procedure as well as the alternatives and risks of each were explained to the patient and/or caregiver.  Consent for the procedure was obtained.  Anesthesia Topical only with 1% lidocaine   Time Out Verified patient identification, verified procedure, site/side was marked, verified correct patient position, special equipment/implants available, medications/allergies/relevant history reviewed, required imaging and test results available.  Sterile Technique Maximal sterile technique including sterile barrier drape, hand hygiene, sterile gown, sterile gloves, mask, hair covering.  Procedure Description Ultrasound used to identify appropriate pleural anatomy for placement and overlying skin marked.  Area of drainage cleaned and draped in sterile fashion.   Lidocaine was used to anesthetize the skin and subcutaneous tissue.   1.5 cm incision made overlying fluid and another about 5 cm anterior to this along chest wall.  PleurX catheter inserted in usual sterile fashion using modified seldinger technique.  Interrupted silk sutures placed at catheter insertion and tunneling points which will be removed at later date.  PleurX catheter then hooked to suction.  After fluid aspirated, pleurX capped and sterile dressing applied.  Complications/Tolerance None; patient tolerated the procedure well. Chest X-ray is ordered to confirm no post-procedural complication.  EBL Minimal  Specimen(s) fluid, left pleural fluid   [name], DO [facility] 11/11/2020 3:06 PM

## 2020-11-11 NOTE — Discharge Planning (Signed)
Fuller Mandril, RN, BSN, [place] [phone] Pt qualifies for DME plure-X drainage.  DME  ordered through [facility].  RNCM faxed to  [phone].

## 2020-11-11 NOTE — Telephone Encounter (Signed)
Noted by triage.  °Will close encounter.  °

## 2020-11-11 NOTE — Telephone Encounter (Signed)
Received call report from [place] with [facility].  CXR showed Small left lateral basilar pneumothorax. Report is available via epic.  Dr. Valeta Harms, please advise. Thanks

## 2020-11-12 LAB — CYTOLOGY - NON PAP

## 2020-11-13 ENCOUNTER — Encounter (HOSPITAL_COMMUNITY): Payer: Self-pay | Admitting: Pulmonary Disease

## 2020-11-14 ENCOUNTER — Other Ambulatory Visit: Payer: Self-pay | Admitting: Family Medicine

## 2020-11-14 ENCOUNTER — Other Ambulatory Visit (HOSPITAL_COMMUNITY): Payer: Self-pay

## 2020-11-14 DIAGNOSIS — C3490 Malignant neoplasm of unspecified part of unspecified bronchus or lung: Secondary | ICD-10-CM | POA: Diagnosis not present

## 2020-11-14 DIAGNOSIS — J910 Malignant pleural effusion: Secondary | ICD-10-CM | POA: Diagnosis not present

## 2020-11-15 ENCOUNTER — Other Ambulatory Visit (HOSPITAL_COMMUNITY): Payer: Self-pay

## 2020-11-15 ENCOUNTER — Other Ambulatory Visit: Payer: Self-pay | Admitting: Family Medicine

## 2020-11-16 ENCOUNTER — Other Ambulatory Visit (HOSPITAL_COMMUNITY): Payer: Self-pay

## 2020-11-16 ENCOUNTER — Other Ambulatory Visit: Payer: Self-pay | Admitting: Family Medicine

## 2020-11-18 ENCOUNTER — Telehealth: Payer: Self-pay | Admitting: Pulmonary Disease

## 2020-11-18 ENCOUNTER — Other Ambulatory Visit (HOSPITAL_COMMUNITY): Payer: Self-pay

## 2020-11-18 ENCOUNTER — Telehealth: Payer: Self-pay | Admitting: Family Medicine

## 2020-11-18 ENCOUNTER — Other Ambulatory Visit: Payer: Self-pay | Admitting: Family Medicine

## 2020-11-18 ENCOUNTER — Telehealth: Payer: Self-pay | Admitting: Acute Care

## 2020-11-18 MED ORDER — TRAMADOL HCL 50 MG PO TABS
ORAL_TABLET | ORAL | 0 refills | Status: DC
  Filled 2020-11-18 – 2020-12-16 (×2): qty 90, 30d supply, fill #0

## 2020-11-18 MED ORDER — TRAMADOL HCL 50 MG PO TABS
ORAL_TABLET | ORAL | 0 refills | Status: DC
  Filled 2020-11-18: qty 90, 30d supply, fill #0

## 2020-11-18 NOTE — Telephone Encounter (Signed)
Requesting: tramadol Contract:07/16/20 UDS:07/16/20 Last Visit:07/16/20 Next Visit:01/10/21 Last Refill:10/16/20  Please Advise

## 2020-11-18 NOTE — Telephone Encounter (Signed)
I called the patient's number to call the results of his pleural fluid cytology.  His wife answered the phone.  She is listed as DPOA and therefore asked if I would give her the results because her husband was asleep.  I explained that his pleural fluid was negative for malignant cells but did show mixed inflammatory cells.  The patient does have an appointment with Dr. Valeta Harms this Wednesday, November 20, 2020 at 3:45 in the afternoon and I explained that Dr. Valeta Harms could review the results with them in detail then, and discuss any follow up if any was needed  [name]  had no further questions at completion of the call.  She was not aware of the appointment on Wednesday, but has now documented it and stated  that they will be there.

## 2020-11-18 NOTE — Telephone Encounter (Signed)
The appt is fine  Called [name] and LMTCB

## 2020-11-18 NOTE — Telephone Encounter (Signed)
Medication: [id number]  traMADol (ULTRAM) 50 MG tablet   Has the patient contacted their pharmacy? Yes.   (If no, request that the patient contact the pharmacy for the refill.) (If yes, when and what did the pharmacy advise?)  Preferred Pharmacy (with phone number or street name):  [facility] [address], [place] [address]  Phone:  [phone]  Fax:  [phone]   Agent: Please be advised that RX refills may take up to 3 business days. We ask that you follow-up with your pharmacy.

## 2020-11-18 NOTE — Telephone Encounter (Signed)
Scarlet daughter checking on supplies for draining fluid. Scarlet phone number is [phone].

## 2020-11-19 ENCOUNTER — Other Ambulatory Visit (HOSPITAL_COMMUNITY): Payer: Self-pay

## 2020-11-19 NOTE — Telephone Encounter (Signed)
I have called and LM on VM for the pts daughter, Darrick Grinder to call us back.

## 2020-11-19 NOTE — Telephone Encounter (Signed)
Pts are supposed to be set up with DME before they leave the hospital after having a pleurx placed. Checked pt's chart and saw a note from Bishop Dublin, RN stating that pt qualified for DME pleurx drainage and the DME was ordered through [facility].  Attempted to call pt's daughter Scarlet but unable to reach. Left message for her to return call.

## 2020-11-19 NOTE — Telephone Encounter (Signed)
[name] is returning phone call. [name] phone number is [phone].

## 2020-11-20 ENCOUNTER — Ambulatory Visit: Payer: PPO | Admitting: Pulmonary Disease

## 2020-11-20 NOTE — Telephone Encounter (Signed)
I called the daughter and she reports that the patient has received all of the needed supplies and nothing else is needed.

## 2020-11-25 ENCOUNTER — Ambulatory Visit: Payer: PPO | Admitting: Pulmonary Disease

## 2020-11-28 ENCOUNTER — Ambulatory Visit (INDEPENDENT_AMBULATORY_CARE_PROVIDER_SITE_OTHER): Payer: PPO

## 2020-11-28 ENCOUNTER — Ambulatory Visit (INDEPENDENT_AMBULATORY_CARE_PROVIDER_SITE_OTHER): Payer: PPO | Admitting: Pulmonary Disease

## 2020-11-28 ENCOUNTER — Other Ambulatory Visit: Payer: Self-pay

## 2020-11-28 ENCOUNTER — Encounter: Payer: Self-pay | Admitting: Pulmonary Disease

## 2020-11-28 VITALS — BP 112/58 | HR 70 | Temp 97.9°F | Ht 71.0 in | Wt 146.2 lb

## 2020-11-28 DIAGNOSIS — Z87891 Personal history of nicotine dependence: Secondary | ICD-10-CM

## 2020-11-28 DIAGNOSIS — J9819 Other pulmonary collapse: Secondary | ICD-10-CM | POA: Diagnosis not present

## 2020-11-28 DIAGNOSIS — Z9889 Other specified postprocedural states: Secondary | ICD-10-CM | POA: Diagnosis not present

## 2020-11-28 DIAGNOSIS — J90 Pleural effusion, not elsewhere classified: Secondary | ICD-10-CM

## 2020-11-28 DIAGNOSIS — R918 Other nonspecific abnormal finding of lung field: Secondary | ICD-10-CM | POA: Diagnosis not present

## 2020-11-28 DIAGNOSIS — I5032 Chronic diastolic (congestive) heart failure: Secondary | ICD-10-CM | POA: Diagnosis not present

## 2020-11-28 DIAGNOSIS — Z9689 Presence of other specified functional implants: Secondary | ICD-10-CM | POA: Diagnosis not present

## 2020-11-28 NOTE — H&P (View-Only) (Signed)
Synopsis: Referred in July 2022 for left-sided pleural effusion by [name], MD  Subjective:   PATIENT ID: [name] GENDER: male DOB: Feb 25, 1929, MRN: [id number]  Chief Complaint  Patient presents with   Follow-up    Follow up on pleural effusion     87 year old gentleman, past medical history of carotid disease dyslipidemia, GERD, hypertension, prostate cancer.  Patient had a URI type illness several weeks ago.  It took several weeks for him to get better.  He is feeling better now.  Ultimately due to his persistent cough had a CT scan.  CT scan of the chest revealed left-sided unilateral pleural effusion.  Patient was referred for evaluation of this left-sided effusion.  OV 08/30/2020: Here today for evaluation of recurrent pleural effusion.  Patient had effusion tapped in the office last time.  Ultimately had follow-up with Patricia Nettle, NP with repeat chest x-ray.  Effusion had recurred.  We expected this as he did have a area of trapped lung on last x-ray.  However repeat film showed increasing effusion.  Patient was brought back for a repeat thoracentesis.Once again the repeat thoracentesis showed ex vacuo pneumothorax.  Pleural fluid analysis revealed elevated white blood cells, 87% lymphocytes within the pleural fluid.  Cultures negative.  Patient still feels short of breath approximately 2 weeks after every drainage.  He feels more short of breath again today.  They are planning to go out of town next week to the beach.  OV 09/02/2020: I saw patient today in the office. Discussed risks benefits and alternatives. They agreed to proceed today. Eliquis has been held. Last visit was in the hospital for drainage and he had pain. But now doing better.   OV 10/23/2020: Here today for follow-up for recurrent effusion.  He is on Eliquis.  Ultrasound today in the office shows reaccumulation of fluid.  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  Every time it fills up though he feels short of breath and he does better when it is removed.  He is interested potentially in having a drain placed at some point.  OV 11/28/2020: Here today for follow-up after recent Pleurx catheter placement.  Here today for wound check and suture removal.  He has been draining approximately 10 cc or so each day.     Past Medical History:  Diagnosis Date   Acute bronchitis 06/10/2015   Allergic rhinitis    Anemia    Asthma    Carotid stenosis    a. s/p Left CEA 2002;  b. Carotid US 3/16:  patent R CEA, L < 40%   Chronic pain syndrome    Left shoulder, back   Diverticulosis    Dyslipidemia    Epigastric pain 04/05/2016   Esophageal stricture    Distal, benign   GERD (gastroesophageal reflux disease)    Hemorrhoids    HTN (hypertension)    Negative renal duplex 07-22-11   Hx of echocardiogram    a. Echo 10/11: mod LVH, EF 55-60%   Pleural effusion 07/17/2020   Prostate cancer ([place]) 2000   Seed XRT   Spondylosis, cervical, with myelopathy 11/26/2015   Stroke ([place]) 8/01   right thalamic - on chronic Plavix/ASA     Family History  Problem Relation Age of Onset   Stroke Brother    Stroke Sister    Stroke Mother    Hyperlipidemia Mother  Hypertension Mother    Lung disease Father    Diabetes Son    Gout Son    Obesity Sister    Alcohol abuse Brother    Cancer Brother        lung, smoker     Past Surgical History:  Procedure Laterality Date   APPENDECTOMY  1953   CAROTID ENDARTERECTOMY Right 01/2000   CATARACT EXTRACTION Bilateral    CERVICAL [place] SURGERY  8/07   CHEST TUBE INSERTION Left 11/11/2020   Procedure: INSERTION PLEURAL DRAINAGE CATHETER;  Surgeon: [name], DO;  Location: [facility];  Service: Pulmonary;  Laterality: Left;  Indwelling Pleural Catheter   HIP ARTHROPLASTY Left 10/24/2018   Procedure: ARTHROPLASTY BIPOLAR HIP (HEMIARTHROPLASTY);  Surgeon: Paralee Cancel, MD;  Location: [facility];  Service: Orthopedics;  Laterality: Left;   INSERTION PROSTATE RADIATION SEED  2000   LUMBAR DISC SURGERY  1990s   NASAL SINUS SURGERY     x 3   THORACENTESIS N/A 08/15/2020   Procedure: THORACENTESIS;  Surgeon: [name], DO;  Location: [facility];  Service: Pulmonary;  Laterality: N/A;    Social History   Socioeconomic History   Marital status: Married    Spouse name: Not on file   Number of children: 1   Years of education: 10   Highest education level: Not on file  Occupational History   Occupation: Retired  Tobacco Use   Smoking status: Former    Packs/day: 0.25    Years: 35.00    Pack years: 8.75    Types: Cigarettes    Start date: 1950    Quit date: 01/12/1978    Years since quitting: 42.9   Smokeless tobacco: Current    Types: Chew   Tobacco comments:    Started smoking at age 76  Vaping Use   Vaping Use: Never used  Substance and Sexual Activity   Alcohol use: No    Comment: Prior alcoholic, sober since 5009   Drug use: No   Sexual activity: Not Currently  Other Topics Concern   Not on file  Social History Narrative   Retired lives at home w/ his wife   1 daughter   Right-handed    Caffeine: drinks a lot of coffee    Former smoker, former alcoholic abstinent for many years   Social Determinants of Radio broadcast assistant Strain: Not on file  Food Insecurity: Not on file  Transportation Needs: Not on file  Physical Activity: Not on file  Stress: Not on file  Social Connections: Not on file  Intimate Partner Violence: Not on file     Allergies  Allergen Reactions   Hctz [Hydrochlorothiazide] Shortness Of Breath, Swelling and Other (See Comments)    "Swelling and dyspnea"    Hydralazine Shortness Of Breath and Other (See Comments)    Chest pain and GI issues also   Diovan [Valsartan] Other (See Comments)    Elevated potassium- Hyperkalemia   Metformin And Related Other (See Comments)    Reaction not recalled   Nsaids Other (See  Comments)    Other than Tylenol, he isn't to have these because he's on Eliquis   Other Other (See Comments)    Unnamed topically-applied B/P patch = Caused redness   Prednisone Other (See Comments)    Suicidal thoughts   Spironolactone Swelling    Site of swelling not recalled   Codeine Itching and Nausea Only   Oxycodone-Acetaminophen Itching and Nausea Only  Outpatient Medications Prior to Visit  Medication Sig Dispense Refill   acetaminophen (TYLENOL) 500 MG tablet Take 1,000 mg by mouth every 6 (six) hours as needed for mild pain or headache.     albuterol (VENTOLIN HFA) 108 (90 Base) MCG/ACT inhaler INHALE 2 PUFFS BY MOUTH INTO THE LUNGS EVERY 6 HOURS AS NEEDED 18 g 0   amLODipine (NORVASC) 5 MG tablet TAKE 2 TABLETS BY MOUTH DAILY (Patient taking differently: Take 5 mg by mouth in the morning and at bedtime.) 180 tablet 2   apixaban (ELIQUIS) 5 MG TABS tablet TAKE 1 TABLET BY MOUTH 2 TIMES DAILY 60 tablet 5   atorvastatin (LIPITOR) 40 MG tablet Take 1 tablet by mouth daily. 90 tablet 1   Cholecalciferol (VITAMIN D3 PO) Take 1 tablet by mouth in the morning.     cloNIDine (CATAPRES) 0.1 MG tablet TAKE 1 TABLET BY MOUTH 2 TIMES DAILY 180 tablet 3   COVID-19 mRNA Vac-TriS, Pfizer, SUSP injection Inject into the muscle. 0.3 mL 0   docusate sodium (COLACE) 100 MG capsule Take 300 mg by mouth 2 (two) times daily.     famotidine (PEPCID) 40 MG tablet Take 1 tablet (40 mg total) by mouth daily. 30 tablet 2   ferrous sulfate (FERROUSUL) 325 (65 FE) MG tablet Take 1 tablet (325 mg total) by mouth 3 (three) times daily with meals for 14 days. (Patient taking differently: Take 325 mg by mouth in the morning and at bedtime.) 90 tablet 2   fluticasone (FLOVENT HFA) 110 MCG/ACT inhaler Inhale 2 puffs into the lungs 2 (two) times daily. (Patient taking differently: Inhale 2 puffs into the lungs 2 (two) times daily as needed (respiratory issues.).) 1 Inhaler 0   furosemide (LASIX) 40 MG tablet  Take 1 tablet (40 mg) by mouth daily alternating with 1/2 tablet (20 mg) every other day 90 tablet 3   gabapentin (NEURONTIN) 300 MG capsule TAKE 1 CAPSULE BY MOUTH 3 TIMES DAILY (Patient taking differently: Take 300 mg by mouth at bedtime.) 270 capsule 1   lisinopril (ZESTRIL) 2.5 MG tablet TAKE 1 TABLET BY MOUTH DAILY. 90 tablet 1   metoCLOPramide (REGLAN) 5 MG tablet TAKE 1 TABLET BY MOUTH AT BEDTIME. 30 tablet 5   minoxidil (LONITEN) 10 MG tablet TAKE 1 TABLET BY MOUTH TWICE DAILY 180 tablet 3   ondansetron (ZOFRAN ODT) 8 MG disintegrating tablet Dissolve 1 tablet by mouth every 8 hours as needed for nausea or vomiting. 20 tablet 0   pantoprazole (PROTONIX) 40 MG tablet TAKE 1 TABLET BY MOUTH ONCE A DAY BEFORE BREAKFAST 90 tablet 3   polyvinyl alcohol (LIQUIFILM TEARS) 1.4 % ophthalmic solution Place 1 drop into both eyes 5 (five) times daily as needed for dry eyes.     sennosides-docusate sodium (SENOKOT-S) 8.6-50 MG tablet Take 1-2 tablets by mouth See admin instructions. Take 1 tablet in the morning & take 2 tablet at nigh     traMADol (ULTRAM) 50 MG tablet TAKE 1 TABLET BY MOUTH 3 TIMES DAILY AS NEEDED FOR MODERATE PAIN 90 tablet 0   triamcinolone cream (KENALOG) 0.5 % Apply 1 application topically daily as needed (for skin irritation).      No facility-administered medications prior to visit.    Review of Systems  Constitutional:  Negative for chills, fever, malaise/fatigue and weight loss.  HENT:  Negative for hearing loss, sore throat and tinnitus.   Eyes:  Negative for blurred vision and double vision.  Respiratory:  Negative for cough,  hemoptysis, sputum production, shortness of breath, wheezing and stridor.   Cardiovascular:  Negative for chest pain, palpitations, orthopnea, leg swelling and PND.  Gastrointestinal:  Negative for abdominal pain, constipation, diarrhea, heartburn, nausea and vomiting.  Genitourinary:  Negative for dysuria, hematuria and urgency.  Musculoskeletal:   Negative for joint pain and myalgias.  Skin:  Negative for itching and rash.  Neurological:  Negative for dizziness, tingling, weakness and headaches.  Endo/Heme/Allergies:  Negative for environmental allergies. Does not bruise/bleed easily.  Psychiatric/Behavioral:  Negative for depression. The patient is not nervous/anxious and does not have insomnia.   All other systems reviewed and are negative.   Objective:  Physical Exam Vitals reviewed.  Constitutional:      General: He is not in acute distress.    Appearance: He is well-developed.     Comments: Thin, muscle wasting  HENT:     Head: Normocephalic and atraumatic.  Eyes:     General: No scleral icterus.    Conjunctiva/sclera: Conjunctivae normal.     Pupils: Pupils are equal, round, and reactive to light.  Neck:     Vascular: No JVD.     Trachea: No tracheal deviation.  Cardiovascular:     Rate and Rhythm: Normal rate and regular rhythm.     Heart sounds: Normal heart sounds. No murmur heard. Pulmonary:     Effort: Pulmonary effort is normal. No tachypnea, accessory muscle usage or respiratory distress.     Breath sounds: No stridor. No wheezing, rhonchi or rales.     Comments: Diminished breath sounds in the left base Abdominal:     General: Bowel sounds are normal. There is no distension.     Palpations: Abdomen is soft.     Tenderness: There is no abdominal tenderness.  Musculoskeletal:        General: No tenderness.     Cervical back: Neck supple.  Lymphadenopathy:     Cervical: No cervical adenopathy.  Skin:    General: Skin is warm and dry.     Capillary Refill: Capillary refill takes less than 2 seconds.     Findings: No rash.  Neurological:     Mental Status: He is alert and oriented to person, place, and time.  Psychiatric:        Behavior: Behavior normal.     Vitals:   11/28/20 1127  BP: (!) 112/58  Pulse: 70  Temp: 97.9 F (36.6 C)  TempSrc: Oral  SpO2: 95%  Weight: 146 lb 3.2 oz (66.3 kg)   Height: 5\' 11"  (1.803 m)     95% on RA BMI Readings from Last 3 Encounters:  11/28/20 20.39 kg/m  11/11/20 20.08 kg/m  10/23/20 20.14 kg/m   Wt Readings from Last 3 Encounters:  11/28/20 146 lb 3.2 oz (66.3 kg)  11/11/20 144 lb (65.3 kg)  10/23/20 144 lb 6.4 oz (65.5 kg)     CBC    Component Value Date/Time   WBC 8.6 07/16/2020 1659   RBC 3.83 (L) 07/16/2020 1659   HGB 12.1 (L) 07/16/2020 1659   HGB 11.3 (L) 11/23/2019 1209   HCT 35.1 (L) 07/16/2020 1659   HCT 34.7 (L) 11/23/2019 1209   PLT 286.0 07/16/2020 1659   PLT 326 11/23/2019 1209   MCV 91.6 07/16/2020 1659   MCV 94 11/23/2019 1209   MCH 30.5 11/23/2019 1209   MCH 30.4 10/31/2018 0859   MCHC 34.4 07/16/2020 1659   RDW 15.0 07/16/2020 1659   RDW 13.6 11/23/2019 1209  LYMPHSABS 1.0 07/16/2020 1659   MONOABS 0.5 07/16/2020 1659   EOSABS 0.1 07/16/2020 1659   BASOSABS 0.1 07/16/2020 1659    Pleural Fluid Analysis: Results for GRAIDEN, HENES "RAY" (MRN [id number]) as of 08/30/2020 07:41  Ref. Range 08/15/2020 16:57  Albumin, Fluid Latest Units: g/dL 2.3  Fluid Type-FALB Unknown PLEURAL  Cholesterol, Fluid Latest Units: mg/dL 50  Chol, Fluid Type Unknown PLEURAL  Glucose, Fluid Latest Units: mg/dL 110  Fluid Type-FGLU Unknown PLEURAL  Fluid Type-FLDH Unknown PLEURAL  LD, Fluid Latest Ref Range: 3 - 23 U/L 158 (H)  Total protein, fluid Latest Units: g/dL 3.7  Triglycerides, Fluid Latest Ref Range: Not Estab. mg/dL 18  Fluid Type-FCT Unknown PLEURAL  Fluid Type-FTP Unknown PLEURAL  Fluid Type-FTRIG Unknown PLEURAL  Color, Fluid Latest Ref Range: YELLOW  YELLOW (A)  Total Nucleated Cell Count, Fluid Latest Ref Range: 0 - 1,000 cu mm 792  Lymphs, Fluid Latest Units: % 87  Eos, Fluid Latest Units: % 11  Appearance, Fluid Latest Ref Range: CLEAR  CLEAR  Neutrophil Count, Fluid Latest Ref Range: 0 - 25 % 1  Monocyte-Macrophage-Serous Fluid Latest Ref Range: 50 - 90 % 1 (L)    Chest Imaging: Chest x-ray  today following thoracentesis: Post thoracentesis ex vacuo pneumothorax  CT chest 07/09/2020: Left-sided pleural effusion.  Nothing significantly abnormal within the lung parenchyma. The patient's images have been independently reviewed by me.    Pulmonary Functions Testing Results: No flowsheet data found.  FeNO:   Pathology:   Echocardiogram:   Heart Catheterization:     Assessment & Plan:     ICD-10-CM   1. 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 Former smoker  Z87.891     4. Chronic diastolic congestive heart failure (HCC)  I50.32     5. S/P thoracentesis  Z98.890     6. Recurrent left pleural effusion  J90     7. Chest tube in place  Z96.89        Discussion:  This is an 87 year old gentleman, left-sided unilateral recurrent effusion status post 3 thoracentesis all lymphocyte predominant, cultures negative cytology negative.  PET scan relatively unrevealing except a faint spot in the sigmoid colon followed by GI.  After tapping did have an ex vacuo pneumothorax.  He did feel better after having his pleural effusion drained.  Ultimately the decision was made for indwelling Pleurx catheter placement.  Plan: Here today postop to evaluate incision and suture removal. Patient doing well with the drainage.  Over the past couple of weeks he has drained every single day. Over the past weeks drop down to less than 10 cc a day. I recommend him go to draining every other day for the next 2 to 3 weeks. If there is still no output will consider removing it before Christmas. Chest x-ray today to review.    Current Outpatient Medications:    acetaminophen (TYLENOL) 500 MG tablet, Take 1,000 mg by mouth every 6 (six) hours as needed for mild pain or headache., Disp: , Rfl:    albuterol (VENTOLIN HFA) 108 (90 Base) MCG/ACT inhaler, INHALE 2 PUFFS BY MOUTH INTO THE LUNGS EVERY 6 HOURS AS NEEDED, Disp: 18 g, Rfl: 0   amLODipine (NORVASC) 5 MG  tablet, TAKE 2 TABLETS BY MOUTH DAILY (Patient taking differently: Take 5 mg by mouth in the morning and at bedtime.), Disp: 180 tablet, Rfl: 2  apixaban (ELIQUIS) 5 MG TABS tablet, TAKE 1 TABLET BY MOUTH 2 TIMES DAILY, Disp: 60 tablet, Rfl: 5   atorvastatin (LIPITOR) 40 MG tablet, Take 1 tablet by mouth daily., Disp: 90 tablet, Rfl: 1   Cholecalciferol (VITAMIN D3 PO), Take 1 tablet by mouth in the morning., Disp: , Rfl:    cloNIDine (CATAPRES) 0.1 MG tablet, TAKE 1 TABLET BY MOUTH 2 TIMES DAILY, Disp: 180 tablet, Rfl: 3   COVID-19 mRNA Vac-TriS, Pfizer, SUSP injection, Inject into the muscle., Disp: 0.3 mL, Rfl: 0   docusate sodium (COLACE) 100 MG capsule, Take 300 mg by mouth 2 (two) times daily., Disp: , Rfl:    famotidine (PEPCID) 40 MG tablet, Take 1 tablet (40 mg total) by mouth daily., Disp: 30 tablet, Rfl: 2   ferrous sulfate (FERROUSUL) 325 (65 FE) MG tablet, Take 1 tablet (325 mg total) by mouth 3 (three) times daily with meals for 14 days. (Patient taking differently: Take 325 mg by mouth in the morning and at bedtime.), Disp: 90 tablet, Rfl: 2   fluticasone (FLOVENT HFA) 110 MCG/ACT inhaler, Inhale 2 puffs into the lungs 2 (two) times daily. (Patient taking differently: Inhale 2 puffs into the lungs 2 (two) times daily as needed (respiratory issues.).), Disp: 1 Inhaler, Rfl: 0   furosemide (LASIX) 40 MG tablet, Take 1 tablet (40 mg) by mouth daily alternating with 1/2 tablet (20 mg) every other day, Disp: 90 tablet, Rfl: 3   gabapentin (NEURONTIN) 300 MG capsule, TAKE 1 CAPSULE BY MOUTH 3 TIMES DAILY (Patient taking differently: Take 300 mg by mouth at bedtime.), Disp: 270 capsule, Rfl: 1   lisinopril (ZESTRIL) 2.5 MG tablet, TAKE 1 TABLET BY MOUTH DAILY., Disp: 90 tablet, Rfl: 1   metoCLOPramide (REGLAN) 5 MG tablet, TAKE 1 TABLET BY MOUTH AT BEDTIME., Disp: 30 tablet, Rfl: 5   minoxidil (LONITEN) 10 MG tablet, TAKE 1 TABLET BY MOUTH TWICE DAILY, Disp: 180 tablet, Rfl: 3   ondansetron  (ZOFRAN ODT) 8 MG disintegrating tablet, Dissolve 1 tablet by mouth every 8 hours as needed for nausea or vomiting., Disp: 20 tablet, Rfl: 0   pantoprazole (PROTONIX) 40 MG tablet, TAKE 1 TABLET BY MOUTH ONCE A DAY BEFORE BREAKFAST, Disp: 90 tablet, Rfl: 3   polyvinyl alcohol (LIQUIFILM TEARS) 1.4 % ophthalmic solution, Place 1 drop into both eyes 5 (five) times daily as needed for dry eyes., Disp: , Rfl:    sennosides-docusate sodium (SENOKOT-S) 8.6-50 MG tablet, Take 1-2 tablets by mouth See admin instructions. Take 1 tablet in the morning & take 2 tablet at nigh, Disp: , Rfl:    traMADol (ULTRAM) 50 MG tablet, TAKE 1 TABLET BY MOUTH 3 TIMES DAILY AS NEEDED FOR MODERATE PAIN, Disp: 90 tablet, Rfl: 0   triamcinolone cream (KENALOG) 0.5 %, Apply 1 application topically daily as needed (for skin irritation). , Disp: , Rfl:    [name], DO [facility] 11/28/2020 11:53 AM

## 2020-11-28 NOTE — Progress Notes (Signed)
Synopsis: Referred in July 2022 for left-sided pleural effusion by [name], MD  Subjective:   PATIENT ID: [name] GENDER: male DOB: [birth year], MRN: [id number]  Chief Complaint  Patient presents with   Follow-up    Follow up on pleural effusion     87 year old gentleman, past medical history of carotid disease dyslipidemia, GERD, hypertension, prostate cancer.  Patient had a URI type illness several weeks ago.  It took several weeks for him to get better.  He is feeling better now.  Ultimately due to his persistent cough had a CT scan.  CT scan of the chest revealed left-sided unilateral pleural effusion.  Patient was referred for evaluation of this left-sided effusion.  OV 08/30/2020: Here today for evaluation of recurrent pleural effusion.  Patient had effusion tapped in the office last time.  Ultimately had follow-up with Patricia Nettle, NP with repeat chest x-ray.  Effusion had recurred.  We expected this as he did have a area of trapped lung on last x-ray.  However repeat film showed increasing effusion.  Patient was brought back for a repeat thoracentesis.Once again the repeat thoracentesis showed ex vacuo pneumothorax.  Pleural fluid analysis revealed elevated white blood cells, 87% lymphocytes within the pleural fluid.  Cultures negative.  Patient still feels short of breath approximately 2 weeks after every drainage.  He feels more short of breath again today.  They are planning to go out of town next week to the beach.  OV 09/02/2020: I saw patient today in the office. Discussed risks benefits and alternatives. They agreed to proceed today. Eliquis has been held. Last visit was in the hospital for drainage and he had pain. But now doing better.   OV 10/23/2020: Here today for follow-up for recurrent effusion.  He is on Eliquis.  Ultrasound today in the office shows reaccumulation of fluid.  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  Every time it fills up though he feels short of breath and he does better when it is removed.  He is interested potentially in having a drain placed at some point.  OV 11/28/2020: Here today for follow-up after recent Pleurx catheter placement.  Here today for wound check and suture removal.  He has been draining approximately 10 cc or so each day.     Past Medical History:  Diagnosis Date   Acute bronchitis 06/10/2015   Allergic rhinitis    Anemia    Asthma    Carotid stenosis    a. s/p Left CEA 2002;  b. Carotid US 3/16:  patent R CEA, L < 40%   Chronic pain syndrome    Left shoulder, back   Diverticulosis    Dyslipidemia    Epigastric pain 04/05/2016   Esophageal stricture    Distal, benign   GERD (gastroesophageal reflux disease)    Hemorrhoids    HTN (hypertension)    Negative renal duplex 07-22-11   Hx of echocardiogram    a. Echo 10/11: mod LVH, EF 55-60%   Pleural effusion 07/17/2020   Prostate cancer ([place]) 2000   Seed XRT   Spondylosis, cervical, with myelopathy 11/26/2015   Stroke ([place]) 8/01   right thalamic - on chronic Plavix/ASA     Family History  Problem Relation Age of Onset   Stroke Brother    Stroke Sister    Stroke Mother    Hyperlipidemia Mother  Hypertension Mother    Lung disease Father    Diabetes Son    Gout Son    Obesity Sister    Alcohol abuse Brother    Cancer Brother        lung, smoker     Past Surgical History:  Procedure Laterality Date   APPENDECTOMY  1953   CAROTID ENDARTERECTOMY Right 01/2000   CATARACT EXTRACTION Bilateral    CERVICAL [place] SURGERY  8/07   CHEST TUBE INSERTION Left 11/11/2020   Procedure: INSERTION PLEURAL DRAINAGE CATHETER;  Surgeon: [name], DO;  Location: [facility];  Service: Pulmonary;  Laterality: Left;  Indwelling Pleural Catheter   HIP ARTHROPLASTY Left 10/24/2018   Procedure: ARTHROPLASTY BIPOLAR HIP (HEMIARTHROPLASTY);  Surgeon: Paralee Cancel, MD;  Location: [facility];  Service: Orthopedics;  Laterality: Left;   INSERTION PROSTATE RADIATION SEED  2000   LUMBAR DISC SURGERY  1990s   NASAL SINUS SURGERY     x 3   THORACENTESIS N/A 08/15/2020   Procedure: THORACENTESIS;  Surgeon: [name], DO;  Location: [facility];  Service: Pulmonary;  Laterality: N/A;    Social History   Socioeconomic History   Marital status: Married    Spouse name: Not on file   Number of children: 1   Years of education: 10   Highest education level: Not on file  Occupational History   Occupation: Retired  Tobacco Use   Smoking status: Former    Packs/day: 0.25    Years: 35.00    Pack years: 8.75    Types: Cigarettes    Start date: 1950    Quit date: 01/12/1978    Years since quitting: 42.9   Smokeless tobacco: Current    Types: Chew   Tobacco comments:    Started smoking at age 89  Vaping Use   Vaping Use: Never used  Substance and Sexual Activity   Alcohol use: No    Comment: Prior alcoholic, sober since 3235   Drug use: No   Sexual activity: Not Currently  Other Topics Concern   Not on file  Social History Narrative   Retired lives at home w/ his wife   1 daughter   Right-handed    Caffeine: drinks a lot of coffee    Former smoker, former alcoholic abstinent for many years   Social Determinants of Radio broadcast assistant Strain: Not on file  Food Insecurity: Not on file  Transportation Needs: Not on file  Physical Activity: Not on file  Stress: Not on file  Social Connections: Not on file  Intimate Partner Violence: Not on file     Allergies  Allergen Reactions   Hctz [Hydrochlorothiazide] Shortness Of Breath, Swelling and Other (See Comments)    "Swelling and dyspnea"    Hydralazine Shortness Of Breath and Other (See Comments)    Chest pain and GI issues also   Diovan [Valsartan] Other (See Comments)    Elevated potassium- Hyperkalemia   Metformin And Related Other (See Comments)    Reaction not recalled   Nsaids Other (See  Comments)    Other than Tylenol, he isn't to have these because he's on Eliquis   Other Other (See Comments)    Unnamed topically-applied B/P patch = Caused redness   Prednisone Other (See Comments)    Suicidal thoughts   Spironolactone Swelling    Site of swelling not recalled   Codeine Itching and Nausea Only   Oxycodone-Acetaminophen Itching and Nausea Only  Outpatient Medications Prior to Visit  Medication Sig Dispense Refill   acetaminophen (TYLENOL) 500 MG tablet Take 1,000 mg by mouth every 6 (six) hours as needed for mild pain or headache.     albuterol (VENTOLIN HFA) 108 (90 Base) MCG/ACT inhaler INHALE 2 PUFFS BY MOUTH INTO THE LUNGS EVERY 6 HOURS AS NEEDED 18 g 0   amLODipine (NORVASC) 5 MG tablet TAKE 2 TABLETS BY MOUTH DAILY (Patient taking differently: Take 5 mg by mouth in the morning and at bedtime.) 180 tablet 2   apixaban (ELIQUIS) 5 MG TABS tablet TAKE 1 TABLET BY MOUTH 2 TIMES DAILY 60 tablet 5   atorvastatin (LIPITOR) 40 MG tablet Take 1 tablet by mouth daily. 90 tablet 1   Cholecalciferol (VITAMIN D3 PO) Take 1 tablet by mouth in the morning.     cloNIDine (CATAPRES) 0.1 MG tablet TAKE 1 TABLET BY MOUTH 2 TIMES DAILY 180 tablet 3   COVID-19 mRNA Vac-TriS, Pfizer, SUSP injection Inject into the muscle. 0.3 mL 0   docusate sodium (COLACE) 100 MG capsule Take 300 mg by mouth 2 (two) times daily.     famotidine (PEPCID) 40 MG tablet Take 1 tablet (40 mg total) by mouth daily. 30 tablet 2   ferrous sulfate (FERROUSUL) 325 (65 FE) MG tablet Take 1 tablet (325 mg total) by mouth 3 (three) times daily with meals for 14 days. (Patient taking differently: Take 325 mg by mouth in the morning and at bedtime.) 90 tablet 2   fluticasone (FLOVENT HFA) 110 MCG/ACT inhaler Inhale 2 puffs into the lungs 2 (two) times daily. (Patient taking differently: Inhale 2 puffs into the lungs 2 (two) times daily as needed (respiratory issues.).) 1 Inhaler 0   furosemide (LASIX) 40 MG tablet  Take 1 tablet (40 mg) by mouth daily alternating with 1/2 tablet (20 mg) every other day 90 tablet 3   gabapentin (NEURONTIN) 300 MG capsule TAKE 1 CAPSULE BY MOUTH 3 TIMES DAILY (Patient taking differently: Take 300 mg by mouth at bedtime.) 270 capsule 1   lisinopril (ZESTRIL) 2.5 MG tablet TAKE 1 TABLET BY MOUTH DAILY. 90 tablet 1   metoCLOPramide (REGLAN) 5 MG tablet TAKE 1 TABLET BY MOUTH AT BEDTIME. 30 tablet 5   minoxidil (LONITEN) 10 MG tablet TAKE 1 TABLET BY MOUTH TWICE DAILY 180 tablet 3   ondansetron (ZOFRAN ODT) 8 MG disintegrating tablet Dissolve 1 tablet by mouth every 8 hours as needed for nausea or vomiting. 20 tablet 0   pantoprazole (PROTONIX) 40 MG tablet TAKE 1 TABLET BY MOUTH ONCE A DAY BEFORE BREAKFAST 90 tablet 3   polyvinyl alcohol (LIQUIFILM TEARS) 1.4 % ophthalmic solution Place 1 drop into both eyes 5 (five) times daily as needed for dry eyes.     sennosides-docusate sodium (SENOKOT-S) 8.6-50 MG tablet Take 1-2 tablets by mouth See admin instructions. Take 1 tablet in the morning & take 2 tablet at nigh     traMADol (ULTRAM) 50 MG tablet TAKE 1 TABLET BY MOUTH 3 TIMES DAILY AS NEEDED FOR MODERATE PAIN 90 tablet 0   triamcinolone cream (KENALOG) 0.5 % Apply 1 application topically daily as needed (for skin irritation).      No facility-administered medications prior to visit.    Review of Systems  Constitutional:  Negative for chills, fever, malaise/fatigue and weight loss.  HENT:  Negative for hearing loss, sore throat and tinnitus.   Eyes:  Negative for blurred vision and double vision.  Respiratory:  Negative for cough,  hemoptysis, sputum production, shortness of breath, wheezing and stridor.   Cardiovascular:  Negative for chest pain, palpitations, orthopnea, leg swelling and PND.  Gastrointestinal:  Negative for abdominal pain, constipation, diarrhea, heartburn, nausea and vomiting.  Genitourinary:  Negative for dysuria, hematuria and urgency.  Musculoskeletal:   Negative for joint pain and myalgias.  Skin:  Negative for itching and rash.  Neurological:  Negative for dizziness, tingling, weakness and headaches.  Endo/Heme/Allergies:  Negative for environmental allergies. Does not bruise/bleed easily.  Psychiatric/Behavioral:  Negative for depression. The patient is not nervous/anxious and does not have insomnia.   All other systems reviewed and are negative.   Objective:  Physical Exam Vitals reviewed.  Constitutional:      General: He is not in acute distress.    Appearance: He is well-developed.     Comments: Thin, muscle wasting  HENT:     Head: Normocephalic and atraumatic.  Eyes:     General: No scleral icterus.    Conjunctiva/sclera: Conjunctivae normal.     Pupils: Pupils are equal, round, and reactive to light.  Neck:     Vascular: No JVD.     Trachea: No tracheal deviation.  Cardiovascular:     Rate and Rhythm: Normal rate and regular rhythm.     Heart sounds: Normal heart sounds. No murmur heard. Pulmonary:     Effort: Pulmonary effort is normal. No tachypnea, accessory muscle usage or respiratory distress.     Breath sounds: No stridor. No wheezing, rhonchi or rales.     Comments: Diminished breath sounds in the left base Abdominal:     General: Bowel sounds are normal. There is no distension.     Palpations: Abdomen is soft.     Tenderness: There is no abdominal tenderness.  Musculoskeletal:        General: No tenderness.     Cervical back: Neck supple.  Lymphadenopathy:     Cervical: No cervical adenopathy.  Skin:    General: Skin is warm and dry.     Capillary Refill: Capillary refill takes less than 2 seconds.     Findings: No rash.  Neurological:     Mental Status: He is alert and oriented to person, place, and time.  Psychiatric:        Behavior: Behavior normal.     Vitals:   11/28/20 1127  BP: (!) 112/58  Pulse: 70  Temp: 97.9 F (36.6 C)  TempSrc: Oral  SpO2: 95%  Weight: 146 lb 3.2 oz (66.3 kg)   Height: 5\' 11"  (1.803 m)     95% on RA BMI Readings from Last 3 Encounters:  11/28/20 20.39 kg/m  11/11/20 20.08 kg/m  10/23/20 20.14 kg/m   Wt Readings from Last 3 Encounters:  11/28/20 146 lb 3.2 oz (66.3 kg)  11/11/20 144 lb (65.3 kg)  10/23/20 144 lb 6.4 oz (65.5 kg)     CBC    Component Value Date/Time   WBC 8.6 07/16/2020 1659   RBC 3.83 (L) 07/16/2020 1659   HGB 12.1 (L) 07/16/2020 1659   HGB 11.3 (L) 11/23/2019 1209   HCT 35.1 (L) 07/16/2020 1659   HCT 34.7 (L) 11/23/2019 1209   PLT 286.0 07/16/2020 1659   PLT 326 11/23/2019 1209   MCV 91.6 07/16/2020 1659   MCV 94 11/23/2019 1209   MCH 30.5 11/23/2019 1209   MCH 30.4 10/31/2018 0859   MCHC 34.4 07/16/2020 1659   RDW 15.0 07/16/2020 1659   RDW 13.6 11/23/2019 1209  LYMPHSABS 1.0 07/16/2020 1659   MONOABS 0.5 07/16/2020 1659   EOSABS 0.1 07/16/2020 1659   BASOSABS 0.1 07/16/2020 1659    Pleural Fluid Analysis: Results for LACEY, DOTSON "RAY" (MRN [id number]) as of 08/30/2020 07:41  Ref. Range 08/15/2020 16:57  Albumin, Fluid Latest Units: g/dL 2.3  Fluid Type-FALB Unknown PLEURAL  Cholesterol, Fluid Latest Units: mg/dL 50  Chol, Fluid Type Unknown PLEURAL  Glucose, Fluid Latest Units: mg/dL 110  Fluid Type-FGLU Unknown PLEURAL  Fluid Type-FLDH Unknown PLEURAL  LD, Fluid Latest Ref Range: 3 - 23 U/L 158 (H)  Total protein, fluid Latest Units: g/dL 3.7  Triglycerides, Fluid Latest Ref Range: Not Estab. mg/dL 18  Fluid Type-FCT Unknown PLEURAL  Fluid Type-FTP Unknown PLEURAL  Fluid Type-FTRIG Unknown PLEURAL  Color, Fluid Latest Ref Range: YELLOW  YELLOW (A)  Total Nucleated Cell Count, Fluid Latest Ref Range: 0 - 1,000 cu mm 792  Lymphs, Fluid Latest Units: % 87  Eos, Fluid Latest Units: % 11  Appearance, Fluid Latest Ref Range: CLEAR  CLEAR  Neutrophil Count, Fluid Latest Ref Range: 0 - 25 % 1  Monocyte-Macrophage-Serous Fluid Latest Ref Range: 50 - 90 % 1 (L)    Chest Imaging: Chest x-ray  today following thoracentesis: Post thoracentesis ex vacuo pneumothorax  CT chest 07/09/2020: Left-sided pleural effusion.  Nothing significantly abnormal within the lung parenchyma. The patient's images have been independently reviewed by me.    Pulmonary Functions Testing Results: No flowsheet data found.  FeNO:   Pathology:   Echocardiogram:   Heart Catheterization:     Assessment & Plan:     ICD-10-CM   1. 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 Former smoker  Z87.891     4. Chronic diastolic congestive heart failure (HCC)  I50.32     5. S/P thoracentesis  Z98.890     6. Recurrent left pleural effusion  J90     7. Chest tube in place  Z96.89        Discussion:  This is an 87 year old gentleman, left-sided unilateral recurrent effusion status post 3 thoracentesis all lymphocyte predominant, cultures negative cytology negative.  PET scan relatively unrevealing except a faint spot in the sigmoid colon followed by GI.  After tapping did have an ex vacuo pneumothorax.  He did feel better after having his pleural effusion drained.  Ultimately the decision was made for indwelling Pleurx catheter placement.  Plan: Here today postop to evaluate incision and suture removal. Patient doing well with the drainage.  Over the past couple of weeks he has drained every single day. Over the past weeks drop down to less than 10 cc a day. I recommend him go to draining every other day for the next 2 to 3 weeks. If there is still no output will consider removing it before Christmas. Chest x-ray today to review.    Current Outpatient Medications:    acetaminophen (TYLENOL) 500 MG tablet, Take 1,000 mg by mouth every 6 (six) hours as needed for mild pain or headache., Disp: , Rfl:    albuterol (VENTOLIN HFA) 108 (90 Base) MCG/ACT inhaler, INHALE 2 PUFFS BY MOUTH INTO THE LUNGS EVERY 6 HOURS AS NEEDED, Disp: 18 g, Rfl: 0   amLODipine (NORVASC) 5 MG  tablet, TAKE 2 TABLETS BY MOUTH DAILY (Patient taking differently: Take 5 mg by mouth in the morning and at bedtime.), Disp: 180 tablet, Rfl: 2  apixaban (ELIQUIS) 5 MG TABS tablet, TAKE 1 TABLET BY MOUTH 2 TIMES DAILY, Disp: 60 tablet, Rfl: 5   atorvastatin (LIPITOR) 40 MG tablet, Take 1 tablet by mouth daily., Disp: 90 tablet, Rfl: 1   Cholecalciferol (VITAMIN D3 PO), Take 1 tablet by mouth in the morning., Disp: , Rfl:    cloNIDine (CATAPRES) 0.1 MG tablet, TAKE 1 TABLET BY MOUTH 2 TIMES DAILY, Disp: 180 tablet, Rfl: 3   COVID-19 mRNA Vac-TriS, Pfizer, SUSP injection, Inject into the muscle., Disp: 0.3 mL, Rfl: 0   docusate sodium (COLACE) 100 MG capsule, Take 300 mg by mouth 2 (two) times daily., Disp: , Rfl:    famotidine (PEPCID) 40 MG tablet, Take 1 tablet (40 mg total) by mouth daily., Disp: 30 tablet, Rfl: 2   ferrous sulfate (FERROUSUL) 325 (65 FE) MG tablet, Take 1 tablet (325 mg total) by mouth 3 (three) times daily with meals for 14 days. (Patient taking differently: Take 325 mg by mouth in the morning and at bedtime.), Disp: 90 tablet, Rfl: 2   fluticasone (FLOVENT HFA) 110 MCG/ACT inhaler, Inhale 2 puffs into the lungs 2 (two) times daily. (Patient taking differently: Inhale 2 puffs into the lungs 2 (two) times daily as needed (respiratory issues.).), Disp: 1 Inhaler, Rfl: 0   furosemide (LASIX) 40 MG tablet, Take 1 tablet (40 mg) by mouth daily alternating with 1/2 tablet (20 mg) every other day, Disp: 90 tablet, Rfl: 3   gabapentin (NEURONTIN) 300 MG capsule, TAKE 1 CAPSULE BY MOUTH 3 TIMES DAILY (Patient taking differently: Take 300 mg by mouth at bedtime.), Disp: 270 capsule, Rfl: 1   lisinopril (ZESTRIL) 2.5 MG tablet, TAKE 1 TABLET BY MOUTH DAILY., Disp: 90 tablet, Rfl: 1   metoCLOPramide (REGLAN) 5 MG tablet, TAKE 1 TABLET BY MOUTH AT BEDTIME., Disp: 30 tablet, Rfl: 5   minoxidil (LONITEN) 10 MG tablet, TAKE 1 TABLET BY MOUTH TWICE DAILY, Disp: 180 tablet, Rfl: 3   ondansetron  (ZOFRAN ODT) 8 MG disintegrating tablet, Dissolve 1 tablet by mouth every 8 hours as needed for nausea or vomiting., Disp: 20 tablet, Rfl: 0   pantoprazole (PROTONIX) 40 MG tablet, TAKE 1 TABLET BY MOUTH ONCE A DAY BEFORE BREAKFAST, Disp: 90 tablet, Rfl: 3   polyvinyl alcohol (LIQUIFILM TEARS) 1.4 % ophthalmic solution, Place 1 drop into both eyes 5 (five) times daily as needed for dry eyes., Disp: , Rfl:    sennosides-docusate sodium (SENOKOT-S) 8.6-50 MG tablet, Take 1-2 tablets by mouth See admin instructions. Take 1 tablet in the morning & take 2 tablet at nigh, Disp: , Rfl:    traMADol (ULTRAM) 50 MG tablet, TAKE 1 TABLET BY MOUTH 3 TIMES DAILY AS NEEDED FOR MODERATE PAIN, Disp: 90 tablet, Rfl: 0   triamcinolone cream (KENALOG) 0.5 %, Apply 1 application topically daily as needed (for skin irritation). , Disp: , Rfl:    [name], DO [facility] 11/28/2020 11:53 AM

## 2020-11-28 NOTE — Patient Instructions (Signed)
Thank you for visiting Dr. Valeta Harms at [facility]. Today we recommend the following:  Orders Placed This Encounter  Procedures   DG Chest 2 View   I will call you with the chest x-ray results.  If you are no longer draining any fluid in approximately 3 weeks or so.  We will have the pleural drain removed.    Please do your part to reduce the spread of COVID-19.

## 2020-11-29 ENCOUNTER — Other Ambulatory Visit (HOSPITAL_COMMUNITY): Payer: Self-pay

## 2020-12-02 ENCOUNTER — Other Ambulatory Visit (HOSPITAL_COMMUNITY): Payer: Self-pay

## 2020-12-02 ENCOUNTER — Telehealth: Payer: Self-pay | Admitting: Pulmonary Disease

## 2020-12-02 NOTE — Telephone Encounter (Signed)
Spoke to patient's spouse, Joanne(DPR). [name] is requesting CXR results.  Dr. Valeta Harms, please advise. Thanks

## 2020-12-03 NOTE — Telephone Encounter (Signed)
Lm for patient's spouse, Joanne(DPR).

## 2020-12-07 ENCOUNTER — Other Ambulatory Visit: Payer: Self-pay | Admitting: Internal Medicine

## 2020-12-08 ENCOUNTER — Other Ambulatory Visit (HOSPITAL_COMMUNITY): Payer: Self-pay

## 2020-12-09 ENCOUNTER — Other Ambulatory Visit (HOSPITAL_COMMUNITY): Payer: Self-pay

## 2020-12-09 DIAGNOSIS — J910 Malignant pleural effusion: Secondary | ICD-10-CM | POA: Diagnosis not present

## 2020-12-09 DIAGNOSIS — C3490 Malignant neoplasm of unspecified part of unspecified bronchus or lung: Secondary | ICD-10-CM | POA: Diagnosis not present

## 2020-12-09 MED ORDER — METOCLOPRAMIDE HCL 5 MG PO TABS
ORAL_TABLET | Freq: Every day | ORAL | 5 refills | Status: DC
  Filled 2020-12-09: qty 30, 30d supply, fill #0
  Filled 2021-01-20: qty 30, 30d supply, fill #1
  Filled 2021-02-18: qty 30, 30d supply, fill #2
  Filled 2021-03-24: qty 30, 30d supply, fill #3
  Filled 2021-04-21: qty 30, 30d supply, fill #4
  Filled 2021-05-19: qty 30, 30d supply, fill #5

## 2020-12-09 NOTE — Telephone Encounter (Signed)
[name] wife is returning phone call. [name] phone number is [phone].

## 2020-12-09 NOTE — Telephone Encounter (Signed)
Spoke with [name] (per [facility]) and reviewed results as dictated Dr. Valeta Harms. [name] stated understanding and stated pt had a 1 tablespoon of output from the catheter last night. [name] scheduled OV with Dr. Valeta Harms at end of December. [name] stated she will call if catheter stops having output. Nothing further needed at this time.  Routing to Dr. Valeta Harms as Juluis Rainier

## 2020-12-10 ENCOUNTER — Other Ambulatory Visit (HOSPITAL_COMMUNITY): Payer: Self-pay

## 2020-12-11 ENCOUNTER — Ambulatory Visit: Payer: PPO | Admitting: Pulmonary Disease

## 2020-12-11 DIAGNOSIS — E041 Nontoxic single thyroid nodule: Secondary | ICD-10-CM | POA: Diagnosis not present

## 2020-12-13 ENCOUNTER — Other Ambulatory Visit (HOSPITAL_COMMUNITY): Payer: Self-pay

## 2020-12-13 ENCOUNTER — Other Ambulatory Visit: Payer: Self-pay | Admitting: Internal Medicine

## 2020-12-16 ENCOUNTER — Other Ambulatory Visit (HOSPITAL_COMMUNITY): Payer: Self-pay

## 2020-12-16 MED ORDER — CLONIDINE HCL 0.1 MG PO TABS
ORAL_TABLET | Freq: Two times a day (BID) | ORAL | 2 refills | Status: DC
  Filled 2020-12-16: qty 180, 90d supply, fill #0
  Filled 2020-12-20 – 2021-03-12 (×2): qty 180, 90d supply, fill #1
  Filled 2021-06-16: qty 180, 90d supply, fill #2

## 2020-12-17 ENCOUNTER — Telehealth: Payer: Self-pay | Admitting: Pulmonary Disease

## 2020-12-18 NOTE — Telephone Encounter (Signed)
[name] daughter is returning phone call. [name] phone number is [phone].

## 2020-12-18 NOTE — Telephone Encounter (Signed)
I had tried to call and the family wanted to know if he is needing to have the catheter removed. I called the spouse and she reports that the daughter is getting about a tablespoon of fluid.   Please advise on the removal before 01/08/21.

## 2020-12-18 NOTE — Telephone Encounter (Signed)
Called Scarlett and had to [facility].

## 2020-12-19 ENCOUNTER — Other Ambulatory Visit: Payer: Self-pay | Admitting: Surgery

## 2020-12-19 ENCOUNTER — Other Ambulatory Visit: Payer: Self-pay | Admitting: General Surgery

## 2020-12-19 ENCOUNTER — Telehealth: Payer: Self-pay | Admitting: Pulmonary Disease

## 2020-12-19 DIAGNOSIS — E041 Nontoxic single thyroid nodule: Secondary | ICD-10-CM

## 2020-12-19 NOTE — Telephone Encounter (Signed)
I spoke with [name] and notified of appt date and time  She verbalized understanding and states this will work for the pt  Nothing further needed

## 2020-12-19 NOTE — Telephone Encounter (Signed)
[name], DO  [facility] Triage Pool; Darral Dash, RN; [facility] Procedure 14 hours ago (6:30 PM)    Yes, please schedule for removal of indwelling pleural catheter.  I am working at [employer] Monday through Thursday next week.  Please schedule for catheter removal one of the afternoons at [facility] long endoscopy.   [name], DO  [facility]  12/18/2020 6:30 PM     Checked with Vista Lawman, RN with endo to see if she could help schedule pt for the pleurx removal. Per Larene Beach, she could schedule pt on 12/13 at 2:30 for the pleurx removal.  Pt will need to go to [facility] 12/13 for the removal of the pleurx catheter. To be on the safe side, I would have pt arrive at 2:00.  Attempted to call pt's daughter Scarlet to let her know this info but unable to reach. Left message for her to return call.

## 2020-12-20 ENCOUNTER — Other Ambulatory Visit (HOSPITAL_COMMUNITY): Payer: Self-pay

## 2020-12-20 ENCOUNTER — Other Ambulatory Visit: Payer: Self-pay | Admitting: Internal Medicine

## 2020-12-20 ENCOUNTER — Other Ambulatory Visit: Payer: Self-pay | Admitting: Family Medicine

## 2020-12-23 ENCOUNTER — Other Ambulatory Visit (HOSPITAL_COMMUNITY): Payer: Self-pay

## 2020-12-23 MED ORDER — AMLODIPINE BESYLATE 5 MG PO TABS
ORAL_TABLET | Freq: Every day | ORAL | 2 refills | Status: DC
Start: 2020-12-23 — End: 2021-03-21
  Filled 2020-12-23: qty 180, 90d supply, fill #0

## 2020-12-23 MED ORDER — GABAPENTIN 300 MG PO CAPS
ORAL_CAPSULE | Freq: Three times a day (TID) | ORAL | 1 refills | Status: DC
  Filled 2020-12-23: qty 270, 90d supply, fill #0
  Filled 2021-09-16: qty 270, 90d supply, fill #1

## 2020-12-23 NOTE — Telephone Encounter (Signed)
Left message for the preservice center

## 2020-12-24 ENCOUNTER — Encounter (HOSPITAL_COMMUNITY)
Admission: RE | Disposition: A | Discharge status: Home / Self Care | Payer: Self-pay | Source: Home / Self Care | Attending: Pulmonary Disease

## 2020-12-24 ENCOUNTER — Encounter (HOSPITAL_COMMUNITY): Payer: Self-pay | Admitting: Pulmonary Disease

## 2020-12-24 ENCOUNTER — Other Ambulatory Visit: Payer: Self-pay

## 2020-12-24 ENCOUNTER — Ambulatory Visit (HOSPITAL_COMMUNITY)
Admission: RE | Admit: 2020-12-24 | Discharge: 2020-12-24 | Disposition: A | Discharge status: Home / Self Care | Payer: PPO | Attending: Pulmonary Disease | Admitting: Pulmonary Disease

## 2020-12-24 DIAGNOSIS — Z9689 Presence of other specified functional implants: Secondary | ICD-10-CM

## 2020-12-24 DIAGNOSIS — J90 Pleural effusion, not elsewhere classified: Secondary | ICD-10-CM | POA: Diagnosis not present

## 2020-12-24 DIAGNOSIS — R0609 Other forms of dyspnea: Secondary | ICD-10-CM | POA: Insufficient documentation

## 2020-12-24 HISTORY — PX: REMOVAL OF PLEURAL DRAINAGE CATHETER: SHX5080

## 2020-12-24 SURGERY — MINOR REMOVAL OF PLEURAL DRAINAGE CATHETER

## 2020-12-24 MED ORDER — LIDOCAINE HCL 1 % IJ SOLN
INTRAMUSCULAR | Status: DC | PRN
  Administered 2020-12-24: 5 mL via INTRADERMAL

## 2020-12-24 NOTE — Interval H&P Note (Signed)
History and Physical Interval Note:  12/24/2020 2:49 PM  [name]  has presented today for surgery, with the diagnosis of effusion resolved.  The various methods of treatment have been discussed with the patient and family. After consideration of risks, benefits and other options for treatment, the patient has consented to  Procedure(s): MINOR REMOVAL OF PLEURAL DRAINAGE CATHETER (N/A) as a surgical intervention.  The patient's history has been reviewed, patient examined, no change in status, stable for surgery.  I have reviewed the patient's chart and labs.  Questions were answered to the patient's satisfaction.     [place]

## 2020-12-24 NOTE — Discharge Instructions (Signed)

## 2020-12-24 NOTE — Op Note (Signed)
PleurX Removal Procedure Note  [name]  [id number]  28-Feb-1929  Date:12/24/20  Time:3:00 PM   Provider Performing:[name]  Procedure: Pleurex removal 70761  Indication(s) Relief of dyspnea from recurrent effusion  Consent Risks of the procedure as well as the alternatives and risks of each were explained to the patient and/or caregiver.  Consent for the procedure was obtained.  Anesthesia Topical only with 1% lidocaine   Time Out Verified patient identification, verified procedure, site/side was marked, verified correct patient position, special equipment/implants available, medications/allergies/relevant history reviewed, required imaging and test results available.  Sterile Technique Maximal sterile technique including sterile barrier drape, hand hygiene, sterile gloves, mask   Procedure Description Skin was anesthetized with lidocaine 1% after cleansing of the skin with chlorhexidine around the cough of the indwelling pleural catheter.  A pair of needle tip tweezers were used to loosen the skin from around the cuff.  Traction of the tube was placed horizontally to the chest and the tube was removed from the patient.  The site was inspected.  There was minimal bleeding.  Complications/Tolerance None; patient tolerated the procedure well. Chest X-ray is ordered to confirm no post-procedural complication.  EBL Minimal  Specimen(s) none  Scott Nash, DO [facility] 12/24/2020 3:09 PM

## 2020-12-24 NOTE — Telephone Encounter (Signed)
LM on [name]'s VM - I believe the CPT Code is 12787.

## 2020-12-25 ENCOUNTER — Encounter (HOSPITAL_COMMUNITY): Payer: Self-pay | Admitting: Pulmonary Disease

## 2020-12-28 ENCOUNTER — Other Ambulatory Visit (HOSPITAL_COMMUNITY): Payer: Self-pay

## 2020-12-28 MED FILL — Pantoprazole Sodium EC Tab 40 MG (Base Equiv): ORAL | 90 days supply | Qty: 90 | Fill #2 | Status: AC

## 2021-01-07 ENCOUNTER — Telehealth: Payer: Self-pay | Admitting: Pulmonary Disease

## 2021-01-07 NOTE — Telephone Encounter (Signed)
disregard

## 2021-01-08 ENCOUNTER — Ambulatory Visit: Payer: PPO | Admitting: Pulmonary Disease

## 2021-01-10 ENCOUNTER — Encounter: Payer: BC Managed Care – PPO | Admitting: Medical

## 2021-01-13 ENCOUNTER — Other Ambulatory Visit (HOSPITAL_COMMUNITY): Payer: Self-pay

## 2021-01-13 ENCOUNTER — Other Ambulatory Visit: Payer: Self-pay | Admitting: Family Medicine

## 2021-01-13 NOTE — Telephone Encounter (Signed)
Requesting:TRAMADOL 50MG  Contract: 07/16/20 UDS: 07/16/20 Last Visit:07/16/20 Next Visit: NONE Last Refill: 11/18/20  Please Advise

## 2021-01-14 ENCOUNTER — Telehealth: Payer: Self-pay | Admitting: Family Medicine

## 2021-01-14 ENCOUNTER — Other Ambulatory Visit (HOSPITAL_COMMUNITY): Payer: Self-pay

## 2021-01-14 MED ORDER — TRAMADOL HCL 50 MG PO TABS
ORAL_TABLET | ORAL | 0 refills | Status: DC
Start: 2021-01-14 — End: 2021-02-12
  Filled 2021-01-14: qty 21, 7d supply, fill #0
  Filled 2021-01-16: qty 21, 7d supply, fill #1
  Filled 2021-01-17: qty 69, 23d supply, fill #1

## 2021-01-14 NOTE — Telephone Encounter (Signed)
Prior auth started via cover my meds.  Awaiting determination.  Key: [id number]

## 2021-01-14 NOTE — Telephone Encounter (Incomplete Revision)
Prior auth started via cover my meds.  Awaiting determination.  Key: [id number]

## 2021-01-14 NOTE — Telephone Encounter (Signed)
Pharmacy is requesting prior authorization for tramadol as pt's new plan does not cover a 90 day supply. Did ask pharmacy to fax over pa and confirmed fax number.

## 2021-01-16 ENCOUNTER — Other Ambulatory Visit (HOSPITAL_COMMUNITY): Payer: Self-pay

## 2021-01-16 NOTE — Telephone Encounter (Signed)
Spoke with Museum/gallery conservator at pharmacy advised cover my meds said available without authorization.  Patient got partial and they was unable to run again and will re-run on 1/9.

## 2021-01-17 ENCOUNTER — Other Ambulatory Visit (HOSPITAL_COMMUNITY): Payer: Self-pay

## 2021-01-20 ENCOUNTER — Other Ambulatory Visit (HOSPITAL_COMMUNITY): Payer: Self-pay

## 2021-01-23 ENCOUNTER — Other Ambulatory Visit (HOSPITAL_COMMUNITY): Payer: Self-pay

## 2021-01-31 ENCOUNTER — Telehealth: Payer: Self-pay | Admitting: Family Medicine

## 2021-01-31 NOTE — Telephone Encounter (Signed)
Left message for patient to call back and schedule Medicare Annual Wellness Visit (AWV) in office.   If not able to come in office, please offer to do virtually or by telephone.  Left office number and my jabber [phone].  Last AWV:07/01/2018  Please schedule at anytime with Nurse Health Advisor.

## 2021-02-05 ENCOUNTER — Ambulatory Visit: Payer: Medicare HMO | Admitting: Pulmonary Disease

## 2021-02-05 ENCOUNTER — Other Ambulatory Visit: Payer: Self-pay

## 2021-02-05 ENCOUNTER — Ambulatory Visit (INDEPENDENT_AMBULATORY_CARE_PROVIDER_SITE_OTHER): Payer: Medicare HMO

## 2021-02-05 ENCOUNTER — Encounter: Payer: Self-pay | Admitting: Pulmonary Disease

## 2021-02-05 VITALS — BP 180/60 | HR 63 | Temp 97.7°F | Ht 71.0 in | Wt 146.0 lb

## 2021-02-05 DIAGNOSIS — J90 Pleural effusion, not elsewhere classified: Secondary | ICD-10-CM

## 2021-02-05 DIAGNOSIS — J9819 Other pulmonary collapse: Secondary | ICD-10-CM

## 2021-02-05 DIAGNOSIS — Z9889 Other specified postprocedural states: Secondary | ICD-10-CM

## 2021-02-05 NOTE — Patient Instructions (Signed)
Thank you for visiting Dr. Valeta Harms at [facility]. Today we recommend the following:  Orders Placed This Encounter  Procedures   DG Chest 2 View   Return if symptoms worsen or fail to improve.    Please do your part to reduce the spread of COVID-19.

## 2021-02-05 NOTE — Progress Notes (Signed)
Synopsis: Referred in July 2022 for left-sided pleural effusion by [name], MD  Subjective:   PATIENT ID: [name] GENDER: male DOB: [birth year], MRN: [id number]  Chief Complaint  Patient presents with   Follow-up    Follow up.     88 year old gentleman, past medical history of carotid disease dyslipidemia, GERD, hypertension, prostate cancer.  Patient had a URI type illness several weeks ago.  It took several weeks for him to get better.  He is feeling better now.  Ultimately due to his persistent cough had a CT scan.  CT scan of the chest revealed left-sided unilateral pleural effusion.  Patient was referred for evaluation of this left-sided effusion.  OV 08/30/2020: Here today for evaluation of recurrent pleural effusion.  Patient had effusion tapped in the office last time.  Ultimately had follow-up with Patricia Nettle, NP with repeat chest x-ray.  Effusion had recurred.  We expected this as he did have a area of trapped lung on last x-ray.  However repeat film showed increasing effusion.  Patient was brought back for a repeat thoracentesis.Once again the repeat thoracentesis showed ex vacuo pneumothorax.  Pleural fluid analysis revealed elevated white blood cells, 87% lymphocytes within the pleural fluid.  Cultures negative.  Patient still feels short of breath approximately 2 weeks after every drainage.  He feels more short of breath again today.  They are planning to go out of town next week to the beach.  OV 09/02/2020: I saw patient today in the office. Discussed risks benefits and alternatives. They agreed to proceed today. Eliquis has been held. Last visit was in the hospital for drainage and he had pain. But now doing better.   OV 10/23/2020: Here today for follow-up for recurrent effusion.  He is on Eliquis.  Ultrasound today in the office shows reaccumulation of fluid.  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  Every time it fills up though he feels short of breath and he does better when it is removed.  He is interested potentially in having a drain placed at some point.  OV 11/28/2020: Here today for follow-up after recent Pleurx catheter placement.  Here today for wound check and suture removal.  He has been draining approximately 10 cc or so each day.   OV 02/05/2021: Here today for follow-up after Pleurx catheter removal.  From respiratory standpoint patient has no complaints today.  Talked about getting a chest x-ray today.  Otherwise has no complaints.      Past Medical History:  Diagnosis Date   Acute bronchitis 06/10/2015   Allergic rhinitis    Anemia    Asthma    Carotid stenosis    a. s/p Left CEA 2002;  b. Carotid US 3/16:  patent R CEA, L < 40%   Chronic pain syndrome    Left shoulder, back   Diverticulosis    Dyslipidemia    Epigastric pain 04/05/2016   Esophageal stricture    Distal, benign   GERD (gastroesophageal reflux disease)    Hemorrhoids    HTN (hypertension)    Negative renal duplex 07-22-11   Hx of echocardiogram    a. Echo 10/11: mod LVH, EF 55-60%   Pleural effusion 07/17/2020   Prostate cancer ([place]) 2000   Seed XRT   Spondylosis, cervical, with myelopathy 11/26/2015   Stroke ([place]) 8/01   right thalamic - on chronic Plavix/ASA  Family History  Problem Relation Age of Onset   Stroke Brother    Stroke Sister    Stroke Mother    Hyperlipidemia Mother    Hypertension Mother    Lung disease Father    Diabetes Son    Gout Son    Obesity Sister    Alcohol abuse Brother    Cancer Brother        lung, smoker     Past Surgical History:  Procedure Laterality Date   APPENDECTOMY  1953   CAROTID ENDARTERECTOMY Right 01/2000   CATARACT EXTRACTION Bilateral    CERVICAL [place] SURGERY  8/07   CHEST TUBE INSERTION Left 11/11/2020   Procedure: INSERTION PLEURAL DRAINAGE CATHETER;  Surgeon: [name], DO;  Location: [facility];  Service:  Pulmonary;  Laterality: Left;  Indwelling Pleural Catheter   HIP ARTHROPLASTY Left 10/24/2018   Procedure: ARTHROPLASTY BIPOLAR HIP (HEMIARTHROPLASTY);  Surgeon: Paralee Cancel, MD;  Location: [facility];  Service: Orthopedics;  Laterality: Left;   INSERTION PROSTATE RADIATION SEED  2000   LUMBAR DISC SURGERY  1990s   NASAL SINUS SURGERY     x 3   REMOVAL OF PLEURAL DRAINAGE CATHETER N/A 12/24/2020   Procedure: MINOR REMOVAL OF PLEURAL DRAINAGE CATHETER;  Surgeon: [name], DO;  Location: [facility];  Service: Cardiopulmonary;  Laterality: N/A;   THORACENTESIS N/A 08/15/2020   Procedure: THORACENTESIS;  Surgeon: [name], DO;  Location: [facility];  Service: Pulmonary;  Laterality: N/A;    Social History   Socioeconomic History   Marital status: Married    Spouse name: Not on file   Number of children: 1   Years of education: 10   Highest education level: Not on file  Occupational History   Occupation: Retired  Tobacco Use   Smoking status: Former    Packs/day: 0.25    Years: 35.00    Pack years: 8.75    Types: Cigarettes    Start date: 1950    Quit date: 01/12/1978    Years since quitting: 43.0   Smokeless tobacco: Current    Types: Chew   Tobacco comments:    Started smoking at age 32  Vaping Use   Vaping Use: Never used  Substance and Sexual Activity   Alcohol use: No    Comment: Prior alcoholic, sober since 8676   Drug use: No   Sexual activity: Not Currently  Other Topics Concern   Not on file  Social History Narrative   Retired lives at home w/ his wife   1 daughter   Right-handed    Caffeine: drinks a lot of coffee    Former smoker, former alcoholic abstinent for many years   Social Determinants of Radio broadcast assistant Strain: Not on file  Food Insecurity: Not on file  Transportation Needs: Not on file  Physical Activity: Not on file  Stress: Not on file  Social Connections: Not on file  Intimate Partner Violence: Not on file      Allergies  Allergen Reactions   Hctz [Hydrochlorothiazide] Shortness Of Breath, Swelling and Other (See Comments)    "Swelling and dyspnea"    Hydralazine Shortness Of Breath and Other (See Comments)    Chest pain and GI issues also   Diovan [Valsartan] Other (See Comments)    Elevated potassium- Hyperkalemia   Metformin And Related Other (See Comments)    Reaction not recalled   Nsaids Other (See Comments)    Other than Tylenol, he  isn't to have these because he's on Eliquis   Other Other (See Comments)    Unnamed topically-applied B/P patch = Caused redness   Prednisone Other (See Comments)    Suicidal thoughts   Spironolactone Swelling    Site of swelling not recalled   Codeine Itching and Nausea Only   Oxycodone-Acetaminophen Itching and Nausea Only     Outpatient Medications Prior to Visit  Medication Sig Dispense Refill   acetaminophen (TYLENOL) 500 MG tablet Take 1,000 mg by mouth every 6 (six) hours as needed for mild pain or headache.     albuterol (VENTOLIN HFA) 108 (90 Base) MCG/ACT inhaler INHALE 2 PUFFS BY MOUTH INTO THE LUNGS EVERY 6 HOURS AS NEEDED 18 g 0   amLODipine (NORVASC) 5 MG tablet TAKE 2 TABLETS BY MOUTH DAILY 180 tablet 2   apixaban (ELIQUIS) 5 MG TABS tablet TAKE 1 TABLET BY MOUTH 2 TIMES DAILY 60 tablet 5   atorvastatin (LIPITOR) 40 MG tablet Take 1 tablet by mouth daily. 90 tablet 1   Cholecalciferol (VITAMIN D3 PO) Take 1 tablet by mouth in the morning.     cloNIDine (CATAPRES) 0.1 MG tablet TAKE 1 TABLET BY MOUTH 2 TIMES DAILY 180 tablet 2   COVID-19 mRNA Vac-TriS, Pfizer, SUSP injection Inject into the muscle. 0.3 mL 0   docusate sodium (COLACE) 100 MG capsule Take 300 mg by mouth 2 (two) times daily.     famotidine (PEPCID) 40 MG tablet Take 1 tablet (40 mg total) by mouth daily. 30 tablet 2   ferrous sulfate (FERROUSUL) 325 (65 FE) MG tablet Take 1 tablet (325 mg total) by mouth 3 (three) times daily with meals for 14 days. (Patient taking  differently: Take 325 mg by mouth in the morning and at bedtime.) 90 tablet 2   fluticasone (FLOVENT HFA) 110 MCG/ACT inhaler Inhale 2 puffs into the lungs 2 (two) times daily. (Patient taking differently: Inhale 2 puffs into the lungs 2 (two) times daily as needed (respiratory issues.).) 1 Inhaler 0   furosemide (LASIX) 40 MG tablet Take 1 tablet (40 mg) by mouth daily alternating with 1/2 tablet (20 mg) every other day 90 tablet 3   gabapentin (NEURONTIN) 300 MG capsule TAKE 1 CAPSULE BY MOUTH 3 TIMES DAILY 270 capsule 1   lisinopril (ZESTRIL) 2.5 MG tablet TAKE 1 TABLET BY MOUTH DAILY. 90 tablet 1   metoCLOPramide (REGLAN) 5 MG tablet TAKE 1 TABLET BY MOUTH AT BEDTIME. 30 tablet 5   minoxidil (LONITEN) 10 MG tablet TAKE 1 TABLET BY MOUTH TWICE DAILY 180 tablet 3   ondansetron (ZOFRAN ODT) 8 MG disintegrating tablet Dissolve 1 tablet by mouth every 8 hours as needed for nausea or vomiting. 20 tablet 0   pantoprazole (PROTONIX) 40 MG tablet TAKE 1 TABLET BY MOUTH ONCE A DAY BEFORE BREAKFAST 90 tablet 3   polyvinyl alcohol (LIQUIFILM TEARS) 1.4 % ophthalmic solution Place 1 drop into both eyes 5 (five) times daily as needed for dry eyes.     sennosides-docusate sodium (SENOKOT-S) 8.6-50 MG tablet Take 1-2 tablets by mouth See admin instructions. Take 1 tablet in the morning & take 2 tablet at nigh     traMADol (ULTRAM) 50 MG tablet TAKE 1 TABLET BY MOUTH 3 TIMES DAILY AS NEEDED FOR MODERATE PAIN 90 tablet 0   triamcinolone cream (KENALOG) 0.5 % Apply 1 application topically daily as needed (for skin irritation).      No facility-administered medications prior to visit.    Review  of Systems  Constitutional:  Negative for chills, fever, malaise/fatigue and weight loss.  HENT:  Negative for hearing loss, sore throat and tinnitus.   Eyes:  Negative for blurred vision and double vision.  Respiratory:  Negative for cough, hemoptysis, sputum production, shortness of breath, wheezing and stridor.    Cardiovascular:  Negative for chest pain, palpitations, orthopnea, leg swelling and PND.  Gastrointestinal:  Negative for abdominal pain, constipation, diarrhea, heartburn, nausea and vomiting.  Genitourinary:  Negative for dysuria, hematuria and urgency.  Musculoskeletal:  Negative for joint pain and myalgias.  Skin:  Negative for itching and rash.  Neurological:  Negative for dizziness, tingling, weakness and headaches.  Endo/Heme/Allergies:  Negative for environmental allergies. Does not bruise/bleed easily.  Psychiatric/Behavioral:  Negative for depression. The patient is not nervous/anxious and does not have insomnia.   All other systems reviewed and are negative.   Objective:  Physical Exam Vitals reviewed.  Constitutional:      General: He is not in acute distress.    Appearance: He is well-developed.  HENT:     Head: Normocephalic and atraumatic.  Eyes:     General: No scleral icterus.    Conjunctiva/sclera: Conjunctivae normal.     Pupils: Pupils are equal, round, and reactive to light.  Neck:     Vascular: No JVD.     Trachea: No tracheal deviation.  Cardiovascular:     Rate and Rhythm: Normal rate and regular rhythm.     Heart sounds: Normal heart sounds. No murmur heard. Pulmonary:     Effort: Pulmonary effort is normal. No tachypnea, accessory muscle usage or respiratory distress.     Breath sounds: No stridor. No wheezing, rhonchi or rales.  Abdominal:     General: There is no distension.     Palpations: Abdomen is soft.     Tenderness: There is no abdominal tenderness.  Musculoskeletal:        General: No tenderness.     Cervical back: Neck supple.  Lymphadenopathy:     Cervical: No cervical adenopathy.  Skin:    General: Skin is warm and dry.     Capillary Refill: Capillary refill takes less than 2 seconds.     Findings: No rash.  Neurological:     Mental Status: He is alert and oriented to person, place, and time.  Psychiatric:        Behavior:  Behavior normal.     Vitals:   02/05/21 1502  BP: (!) 180/60  Pulse: 63  Temp: 97.7 F (36.5 C)  TempSrc: Oral  SpO2: 97%  Weight: 146 lb (66.2 kg)  Height: 5\' 11"  (1.803 m)     97% on RA BMI Readings from Last 3 Encounters:  02/05/21 20.36 kg/m  11/28/20 20.39 kg/m  11/11/20 20.08 kg/m   Wt Readings from Last 3 Encounters:  02/05/21 146 lb (66.2 kg)  11/28/20 146 lb 3.2 oz (66.3 kg)  11/11/20 144 lb (65.3 kg)     CBC    Component Value Date/Time   WBC 8.6 07/16/2020 1659   RBC 3.83 (L) 07/16/2020 1659   HGB 12.1 (L) 07/16/2020 1659   HGB 11.3 (L) 11/23/2019 1209   HCT 35.1 (L) 07/16/2020 1659   HCT 34.7 (L) 11/23/2019 1209   PLT 286.0 07/16/2020 1659   PLT 326 11/23/2019 1209   MCV 91.6 07/16/2020 1659   MCV 94 11/23/2019 1209   MCH 30.5 11/23/2019 1209   MCH 30.4 10/31/2018 0859   MCHC 34.4 07/16/2020 1659  RDW 15.0 07/16/2020 1659   RDW 13.6 11/23/2019 1209   LYMPHSABS 1.0 07/16/2020 1659   MONOABS 0.5 07/16/2020 1659   EOSABS 0.1 07/16/2020 1659   BASOSABS 0.1 07/16/2020 1659    Pleural Fluid Analysis: Results for LADELL, LEA "RAY" (MRN [id number]) as of 08/30/2020 07:41  Ref. Range 08/15/2020 16:57  Albumin, Fluid Latest Units: g/dL 2.3  Fluid Type-FALB Unknown PLEURAL  Cholesterol, Fluid Latest Units: mg/dL 50  Chol, Fluid Type Unknown PLEURAL  Glucose, Fluid Latest Units: mg/dL 110  Fluid Type-FGLU Unknown PLEURAL  Fluid Type-FLDH Unknown PLEURAL  LD, Fluid Latest Ref Range: 3 - 23 U/L 158 (H)  Total protein, fluid Latest Units: g/dL 3.7  Triglycerides, Fluid Latest Ref Range: Not Estab. mg/dL 18  Fluid Type-FCT Unknown PLEURAL  Fluid Type-FTP Unknown PLEURAL  Fluid Type-FTRIG Unknown PLEURAL  Color, Fluid Latest Ref Range: YELLOW  YELLOW (A)  Total Nucleated Cell Count, Fluid Latest Ref Range: 0 - 1,000 cu mm 792  Lymphs, Fluid Latest Units: % 87  Eos, Fluid Latest Units: % 11  Appearance, Fluid Latest Ref Range: CLEAR  CLEAR   Neutrophil Count, Fluid Latest Ref Range: 0 - 25 % 1  Monocyte-Macrophage-Serous Fluid Latest Ref Range: 50 - 90 % 1 (L)    Chest Imaging: Chest x-ray today following thoracentesis: Post thoracentesis ex vacuo pneumothorax  CT chest 07/09/2020: Left-sided pleural effusion.  Nothing significantly abnormal within the lung parenchyma. The patient's images have been independently reviewed by me.    Pulmonary Functions Testing Results: No flowsheet data found.  FeNO:   Pathology:   Echocardiogram:   Heart Catheterization:     Assessment & Plan:     ICD-10-CM   1. Pleural effusion  J90 DG Chest 2 View    2. 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  PET scan unrevealing except for small spot in the sigmoid followed by GI.  He had an ex vacuo pneumothorax after drainage of the effusion.  Ultimately had a Pleurx catheter placed and drained until it stopped.  Pleurx has since been removed.  Plan: Chest x-ray today shows persistence of pleural thickening on the left side.  Reviewed chest x-ray today in the office with patient. Unchanged from previous, effusion has not enlarged. I would continue to follow him conservatively at this point. Hopefully the left side is scarred down and will cause [place] no additional trouble in the future.    Current Outpatient Medications:    acetaminophen (TYLENOL) 500 MG tablet, Take 1,000 mg by mouth every 6 (six) hours as needed for mild pain or headache., Disp: , Rfl:    albuterol (VENTOLIN HFA) 108 (90 Base) MCG/ACT inhaler, INHALE 2 PUFFS BY MOUTH INTO THE LUNGS EVERY 6 HOURS AS NEEDED, Disp: 18 g, Rfl: 0   amLODipine (NORVASC) 5 MG tablet, TAKE 2 TABLETS BY MOUTH DAILY, Disp: 180 tablet, Rfl: 2   apixaban (ELIQUIS) 5 MG TABS tablet, TAKE 1 TABLET BY  MOUTH 2 TIMES DAILY, Disp: 60 tablet, Rfl: 5   atorvastatin (LIPITOR) 40 MG tablet, Take 1 tablet by mouth daily., Disp: 90 tablet, Rfl: 1   Cholecalciferol (VITAMIN D3 PO), Take 1 tablet by mouth in the morning., Disp: , Rfl:    cloNIDine (CATAPRES) 0.1 MG tablet, TAKE 1 TABLET BY MOUTH 2  TIMES DAILY, Disp: 180 tablet, Rfl: 2   COVID-19 mRNA Vac-TriS, Pfizer, SUSP injection, Inject into the muscle., Disp: 0.3 mL, Rfl: 0   docusate sodium (COLACE) 100 MG capsule, Take 300 mg by mouth 2 (two) times daily., Disp: , Rfl:    famotidine (PEPCID) 40 MG tablet, Take 1 tablet (40 mg total) by mouth daily., Disp: 30 tablet, Rfl: 2   ferrous sulfate (FERROUSUL) 325 (65 FE) MG tablet, Take 1 tablet (325 mg total) by mouth 3 (three) times daily with meals for 14 days. (Patient taking differently: Take 325 mg by mouth in the morning and at bedtime.), Disp: 90 tablet, Rfl: 2   fluticasone (FLOVENT HFA) 110 MCG/ACT inhaler, Inhale 2 puffs into the lungs 2 (two) times daily. (Patient taking differently: Inhale 2 puffs into the lungs 2 (two) times daily as needed (respiratory issues.).), Disp: 1 Inhaler, Rfl: 0   furosemide (LASIX) 40 MG tablet, Take 1 tablet (40 mg) by mouth daily alternating with 1/2 tablet (20 mg) every other day, Disp: 90 tablet, Rfl: 3   gabapentin (NEURONTIN) 300 MG capsule, TAKE 1 CAPSULE BY MOUTH 3 TIMES DAILY, Disp: 270 capsule, Rfl: 1   lisinopril (ZESTRIL) 2.5 MG tablet, TAKE 1 TABLET BY MOUTH DAILY., Disp: 90 tablet, Rfl: 1   metoCLOPramide (REGLAN) 5 MG tablet, TAKE 1 TABLET BY MOUTH AT BEDTIME., Disp: 30 tablet, Rfl: 5   minoxidil (LONITEN) 10 MG tablet, TAKE 1 TABLET BY MOUTH TWICE DAILY, Disp: 180 tablet, Rfl: 3   ondansetron (ZOFRAN ODT) 8 MG disintegrating tablet, Dissolve 1 tablet by mouth every 8 hours as needed for nausea or vomiting., Disp: 20 tablet, Rfl: 0   pantoprazole (PROTONIX) 40 MG tablet, TAKE 1 TABLET BY MOUTH ONCE A DAY BEFORE BREAKFAST, Disp: 90 tablet, Rfl: 3    polyvinyl alcohol (LIQUIFILM TEARS) 1.4 % ophthalmic solution, Place 1 drop into both eyes 5 (five) times daily as needed for dry eyes., Disp: , Rfl:    sennosides-docusate sodium (SENOKOT-S) 8.6-50 MG tablet, Take 1-2 tablets by mouth See admin instructions. Take 1 tablet in the morning & take 2 tablet at nigh, Disp: , Rfl:    traMADol (ULTRAM) 50 MG tablet, TAKE 1 TABLET BY MOUTH 3 TIMES DAILY AS NEEDED FOR MODERATE PAIN, Disp: 90 tablet, Rfl: 0   triamcinolone cream (KENALOG) 0.5 %, Apply 1 application topically daily as needed (for skin irritation). , Disp: , Rfl:    [name], DO [facility] 02/05/2021 3:08 PM

## 2021-02-10 ENCOUNTER — Other Ambulatory Visit: Payer: Self-pay | Admitting: Family Medicine

## 2021-02-10 ENCOUNTER — Other Ambulatory Visit (HOSPITAL_COMMUNITY): Payer: Self-pay

## 2021-02-10 DIAGNOSIS — E559 Vitamin D deficiency, unspecified: Secondary | ICD-10-CM

## 2021-02-10 DIAGNOSIS — I10 Essential (primary) hypertension: Secondary | ICD-10-CM

## 2021-02-10 DIAGNOSIS — I6350 Cerebral infarction due to unspecified occlusion or stenosis of unspecified cerebral artery: Secondary | ICD-10-CM

## 2021-02-10 DIAGNOSIS — E782 Mixed hyperlipidemia: Secondary | ICD-10-CM

## 2021-02-10 MED ORDER — LISINOPRIL 2.5 MG PO TABS
ORAL_TABLET | Freq: Every day | ORAL | 0 refills | Status: DC
  Filled 2021-02-10: qty 30, 30d supply, fill #0

## 2021-02-10 MED ORDER — ATORVASTATIN CALCIUM 40 MG PO TABS
40.0000 mg | ORAL_TABLET | Freq: Every day | ORAL | 0 refills | Status: DC
  Filled 2021-02-10: qty 30, 30d supply, fill #0

## 2021-02-12 ENCOUNTER — Other Ambulatory Visit (HOSPITAL_COMMUNITY): Payer: Self-pay

## 2021-02-12 ENCOUNTER — Other Ambulatory Visit: Payer: Self-pay | Admitting: Family Medicine

## 2021-02-13 ENCOUNTER — Other Ambulatory Visit (HOSPITAL_COMMUNITY): Payer: Self-pay

## 2021-02-13 MED ORDER — TRAMADOL HCL 50 MG PO TABS
ORAL_TABLET | ORAL | 0 refills | Status: DC
  Filled 2021-02-13: qty 90, 30d supply, fill #0

## 2021-02-13 NOTE — Telephone Encounter (Signed)
Requesting:tramdol Contract:07/16/20 UDS:07/16/20 Last Visit:07 Next Visit 07/16/20 Last Refill:01/14/21  Please Advise

## 2021-02-17 ENCOUNTER — Other Ambulatory Visit (HOSPITAL_COMMUNITY): Payer: Self-pay

## 2021-02-18 ENCOUNTER — Other Ambulatory Visit (HOSPITAL_COMMUNITY): Payer: Self-pay

## 2021-02-20 ENCOUNTER — Telehealth: Payer: Self-pay | Admitting: Family Medicine

## 2021-02-20 NOTE — Telephone Encounter (Signed)
Scheduled with edward tomorrow

## 2021-02-20 NOTE — Telephone Encounter (Signed)
Patient daughter called ans stated patient tested positive for coivd 2/9, symps started 2/7. Patient daughter would like meds prescribed, offered vv with Colin Benton. Patient daughter stated parents are unsure on how to do vv.   Please advise

## 2021-02-21 ENCOUNTER — Telehealth (INDEPENDENT_AMBULATORY_CARE_PROVIDER_SITE_OTHER): Payer: Medicare HMO | Admitting: Medical

## 2021-02-21 ENCOUNTER — Encounter: Payer: Self-pay | Admitting: Medical

## 2021-02-21 ENCOUNTER — Other Ambulatory Visit (HOSPITAL_COMMUNITY): Payer: Self-pay

## 2021-02-21 VITALS — HR 100

## 2021-02-21 DIAGNOSIS — U071 COVID-19: Secondary | ICD-10-CM | POA: Diagnosis not present

## 2021-02-21 DIAGNOSIS — R059 Cough, unspecified: Secondary | ICD-10-CM | POA: Diagnosis not present

## 2021-02-21 MED ORDER — MOLNUPIRAVIR EUA 200MG CAPSULE
4.0000 | ORAL_CAPSULE | Freq: Two times a day (BID) | ORAL | 0 refills | Status: AC
  Filled 2021-02-21: qty 40, 5d supply, fill #0

## 2021-02-21 MED ORDER — AZITHROMYCIN 250 MG PO TABS
ORAL_TABLET | ORAL | 0 refills | Status: AC
  Filled 2021-02-21: qty 6, 5d supply, fill #0

## 2021-02-21 MED ORDER — BENZONATATE 100 MG PO CAPS
100.0000 mg | ORAL_CAPSULE | Freq: Three times a day (TID) | ORAL | 0 refills | Status: DC | PRN
  Filled 2021-02-21: qty 30, 10d supply, fill #0

## 2021-02-21 NOTE — Progress Notes (Signed)
° °  Subjective:    Patient ID: [name], male    DOB: [birth year], 88 y.o.   MRN: [id number]  HPI  Virtual Visit via Telephone Note  I connected with [name] on 02/21/21 at  9:40 AM EST by telephone and verified that I am speaking with the correct person using two identifiers.  Location: Patient: home Provider: office   I discussed the limitations, risks, security and privacy concerns of performing an evaluation and management service by telephone and the availability of in person appointments. I also discussed with the patient that there may be a patient responsible charge related to this service. The patient expressed understanding and agreed to proceed.   History of Present Illness: Pt recently diagnosed with covid on 02/18/2021  Test used-over the counter.  History of covid vaccine x 4. Pt covid last year. This would be second time with covid.  Covid risk score- 8  History of covid infection-x 2.  Current symptoms-Pt tells me little weak, st and cough. Coughing up some mucus. Mild achiness. No sob. No sinus pressure.  Pt 02 sat-96%.     Observations/Objective: General- normal speech not labored. Mild nasal congestion. Not coughing on discussion.  Assessment and Plan:  Patient Instructions  COVID infection and patient formally vaccinated 4 times and 1 COVID infection last year.  Mild to moderate symptoms presently and on day 3 of symptoms.  Advised can use vitamin D over-the-counter 4000 international units daily.  Zinc 50 mg daily.  Prescribing molnupiravir antiviral to start today.  Making azithromycin antibiotic available as we approached the weekend in the event of sinus infection or bronchitis signs/symptoms.  If significant worsening productive cough or chest congestion then placed future x-ray to get done early next week.  Benzonatate prescribed for cough.  Follow-up in 5 to 7 days or sooner if needed.     Follow Up Instructions:    I discussed  the assessment and treatment plan with the patient. The patient was provided an opportunity to ask questions and all were answered. The patient agreed with the plan and demonstrated an understanding of the instructions.   The patient was advised to call back or seek an in-person evaluation if the symptoms worsen or if the condition fails to improve as anticipated.  Time spent with patient today was 20  minutes which consisted of chart revdiew, discussing diagnosis, work up treatment and documentation.    Mackie Pai, PA-C   Review of Systems  Constitutional:  Positive for fever. Negative for chills.  HENT:  Positive for congestion. Negative for sinus pressure.   Eyes:  Negative for pain.  Respiratory:  Positive for cough. Negative for shortness of breath and wheezing.   Cardiovascular:  Negative for chest pain and palpitations.  Gastrointestinal:  Negative for abdominal pain.  Musculoskeletal:  Positive for myalgias.  Neurological:  Negative for dizziness and headaches.  Hematological:  Negative for adenopathy. Does not bruise/bleed easily.      Objective:   Physical Exam        Assessment & Plan:

## 2021-02-21 NOTE — Patient Instructions (Addendum)
COVID infection and patient formally vaccinated 4 times and 1 COVID infection last year.  Mild to moderate symptoms presently and on day 3 of symptoms.  Advised can use vitamin D over-the-counter 4000 international units daily.  Zinc 50 mg daily.  Prescribing molnupiravir antiviral to start today.  Making azithromycin antibiotic available as we approached the weekend in the event of sinus infection or bronchitis signs/symptoms.  If significant worsening productive cough or chest congestion then placed future x-ray to get done early next week.  Benzonatate prescribed for cough.  Follow-up in 5 to 7 days or sooner if needed.

## 2021-03-03 ENCOUNTER — Other Ambulatory Visit (HOSPITAL_COMMUNITY): Payer: Self-pay

## 2021-03-04 ENCOUNTER — Other Ambulatory Visit (HOSPITAL_COMMUNITY): Payer: Self-pay

## 2021-03-04 NOTE — Telephone Encounter (Signed)
Opened in error

## 2021-03-05 ENCOUNTER — Other Ambulatory Visit (HOSPITAL_COMMUNITY): Payer: Self-pay

## 2021-03-12 ENCOUNTER — Other Ambulatory Visit: Payer: Self-pay | Admitting: Family Medicine

## 2021-03-12 ENCOUNTER — Other Ambulatory Visit (HOSPITAL_COMMUNITY): Payer: Self-pay

## 2021-03-12 MED ORDER — LISINOPRIL 2.5 MG PO TABS
ORAL_TABLET | Freq: Every day | ORAL | 0 refills | Status: DC
  Filled 2021-03-12: qty 30, 30d supply, fill #0

## 2021-03-12 MED ORDER — TRAMADOL HCL 50 MG PO TABS
ORAL_TABLET | ORAL | 0 refills | Status: DC
  Filled 2021-03-12: qty 90, 30d supply, fill #0

## 2021-03-12 NOTE — Telephone Encounter (Signed)
Requesting: tramadol 50MG  ?Contract: 07/16/20 ?UDS: 07/16/20 ?Last Visit: 02/21/21 ?Next Visit: Not scheduled ?Last Refill: 02/13/21, #90, 0 refills ? ?Please Advise  ?

## 2021-03-14 ENCOUNTER — Emergency Department (HOSPITAL_COMMUNITY): Payer: Medicare HMO

## 2021-03-14 ENCOUNTER — Inpatient Hospital Stay (HOSPITAL_COMMUNITY)
Admission: EM | Admit: 2021-03-14 | Discharge: 2021-03-22 | DRG: 521 | Disposition: A | Discharge status: Home Health | Payer: Medicare HMO | Attending: Internal Medicine | Admitting: Internal Medicine

## 2021-03-14 ENCOUNTER — Encounter (HOSPITAL_COMMUNITY): Payer: Self-pay | Admitting: Internal Medicine

## 2021-03-14 ENCOUNTER — Other Ambulatory Visit: Payer: Self-pay

## 2021-03-14 DIAGNOSIS — Z8249 Family history of ischemic heart disease and other diseases of the circulatory system: Secondary | ICD-10-CM | POA: Diagnosis not present

## 2021-03-14 DIAGNOSIS — W19XXXA Unspecified fall, initial encounter: Secondary | ICD-10-CM | POA: Diagnosis not present

## 2021-03-14 DIAGNOSIS — S0990XA Unspecified injury of head, initial encounter: Secondary | ICD-10-CM | POA: Diagnosis not present

## 2021-03-14 DIAGNOSIS — S72009A Fracture of unspecified part of neck of unspecified femur, initial encounter for closed fracture: Secondary | ICD-10-CM | POA: Diagnosis present

## 2021-03-14 DIAGNOSIS — M25551 Pain in right hip: Secondary | ICD-10-CM | POA: Diagnosis not present

## 2021-03-14 DIAGNOSIS — Z886 Allergy status to analgesic agent status: Secondary | ICD-10-CM

## 2021-03-14 DIAGNOSIS — Z885 Allergy status to narcotic agent status: Secondary | ICD-10-CM

## 2021-03-14 DIAGNOSIS — G894 Chronic pain syndrome: Secondary | ICD-10-CM | POA: Diagnosis present

## 2021-03-14 DIAGNOSIS — Z20822 Contact with and (suspected) exposure to covid-19: Secondary | ICD-10-CM | POA: Diagnosis not present

## 2021-03-14 DIAGNOSIS — Z888 Allergy status to other drugs, medicaments and biological substances status: Secondary | ICD-10-CM | POA: Diagnosis not present

## 2021-03-14 DIAGNOSIS — I110 Hypertensive heart disease with heart failure: Secondary | ICD-10-CM | POA: Diagnosis present

## 2021-03-14 DIAGNOSIS — Z79899 Other long term (current) drug therapy: Secondary | ICD-10-CM

## 2021-03-14 DIAGNOSIS — R509 Fever, unspecified: Secondary | ICD-10-CM | POA: Diagnosis not present

## 2021-03-14 DIAGNOSIS — Z96642 Presence of left artificial hip joint: Secondary | ICD-10-CM | POA: Diagnosis not present

## 2021-03-14 DIAGNOSIS — Y92511 Restaurant or cafe as the place of occurrence of the external cause: Secondary | ICD-10-CM | POA: Diagnosis not present

## 2021-03-14 DIAGNOSIS — I517 Cardiomegaly: Secondary | ICD-10-CM | POA: Diagnosis not present

## 2021-03-14 DIAGNOSIS — Z7902 Long term (current) use of antithrombotics/antiplatelets: Secondary | ICD-10-CM

## 2021-03-14 DIAGNOSIS — I4891 Unspecified atrial fibrillation: Secondary | ICD-10-CM | POA: Diagnosis not present

## 2021-03-14 DIAGNOSIS — Z7901 Long term (current) use of anticoagulants: Secondary | ICD-10-CM

## 2021-03-14 DIAGNOSIS — K219 Gastro-esophageal reflux disease without esophagitis: Secondary | ICD-10-CM | POA: Diagnosis present

## 2021-03-14 DIAGNOSIS — R102 Pelvic and perineal pain: Secondary | ICD-10-CM | POA: Diagnosis not present

## 2021-03-14 DIAGNOSIS — E782 Mixed hyperlipidemia: Secondary | ICD-10-CM | POA: Diagnosis not present

## 2021-03-14 DIAGNOSIS — Z8673 Personal history of transient ischemic attack (TIA), and cerebral infarction without residual deficits: Secondary | ICD-10-CM | POA: Diagnosis not present

## 2021-03-14 DIAGNOSIS — L89313 Pressure ulcer of right buttock, stage 3: Secondary | ICD-10-CM | POA: Diagnosis present

## 2021-03-14 DIAGNOSIS — D62 Acute posthemorrhagic anemia: Secondary | ICD-10-CM | POA: Diagnosis not present

## 2021-03-14 DIAGNOSIS — E785 Hyperlipidemia, unspecified: Secondary | ICD-10-CM | POA: Diagnosis not present

## 2021-03-14 DIAGNOSIS — I10 Essential (primary) hypertension: Secondary | ICD-10-CM | POA: Diagnosis not present

## 2021-03-14 DIAGNOSIS — Z471 Aftercare following joint replacement surgery: Secondary | ICD-10-CM | POA: Diagnosis not present

## 2021-03-14 DIAGNOSIS — Z8546 Personal history of malignant neoplasm of prostate: Secondary | ICD-10-CM

## 2021-03-14 DIAGNOSIS — F17220 Nicotine dependence, chewing tobacco, uncomplicated: Secondary | ICD-10-CM | POA: Diagnosis present

## 2021-03-14 DIAGNOSIS — J690 Pneumonitis due to inhalation of food and vomit: Secondary | ICD-10-CM | POA: Diagnosis not present

## 2021-03-14 DIAGNOSIS — I480 Paroxysmal atrial fibrillation: Secondary | ICD-10-CM | POA: Diagnosis present

## 2021-03-14 DIAGNOSIS — M1611 Unilateral primary osteoarthritis, right hip: Secondary | ICD-10-CM | POA: Diagnosis not present

## 2021-03-14 DIAGNOSIS — I6381 Other cerebral infarction due to occlusion or stenosis of small artery: Secondary | ICD-10-CM | POA: Diagnosis not present

## 2021-03-14 DIAGNOSIS — M47812 Spondylosis without myelopathy or radiculopathy, cervical region: Secondary | ICD-10-CM | POA: Diagnosis not present

## 2021-03-14 DIAGNOSIS — S3993XA Unspecified injury of pelvis, initial encounter: Secondary | ICD-10-CM | POA: Diagnosis not present

## 2021-03-14 DIAGNOSIS — F418 Other specified anxiety disorders: Secondary | ICD-10-CM | POA: Diagnosis not present

## 2021-03-14 DIAGNOSIS — Z419 Encounter for procedure for purposes other than remedying health state, unspecified: Secondary | ICD-10-CM

## 2021-03-14 DIAGNOSIS — S72001A Fracture of unspecified part of neck of right femur, initial encounter for closed fracture: Secondary | ICD-10-CM | POA: Diagnosis not present

## 2021-03-14 DIAGNOSIS — I739 Peripheral vascular disease, unspecified: Secondary | ICD-10-CM | POA: Diagnosis not present

## 2021-03-14 DIAGNOSIS — Z83438 Family history of other disorder of lipoprotein metabolism and other lipidemia: Secondary | ICD-10-CM

## 2021-03-14 DIAGNOSIS — S72011A Unspecified intracapsular fracture of right femur, initial encounter for closed fracture: Principal | ICD-10-CM | POA: Diagnosis present

## 2021-03-14 DIAGNOSIS — Z96641 Presence of right artificial hip joint: Secondary | ICD-10-CM | POA: Diagnosis not present

## 2021-03-14 DIAGNOSIS — M25572 Pain in left ankle and joints of left foot: Secondary | ICD-10-CM | POA: Diagnosis not present

## 2021-03-14 DIAGNOSIS — Z823 Family history of stroke: Secondary | ICD-10-CM

## 2021-03-14 DIAGNOSIS — S72041A Displaced fracture of base of neck of right femur, initial encounter for closed fracture: Secondary | ICD-10-CM | POA: Diagnosis not present

## 2021-03-14 DIAGNOSIS — R9431 Abnormal electrocardiogram [ECG] [EKG]: Secondary | ICD-10-CM | POA: Diagnosis not present

## 2021-03-14 DIAGNOSIS — I5032 Chronic diastolic (congestive) heart failure: Secondary | ICD-10-CM | POA: Diagnosis present

## 2021-03-14 DIAGNOSIS — J90 Pleural effusion, not elsewhere classified: Secondary | ICD-10-CM | POA: Diagnosis not present

## 2021-03-14 DIAGNOSIS — J45909 Unspecified asthma, uncomplicated: Secondary | ICD-10-CM | POA: Diagnosis not present

## 2021-03-14 DIAGNOSIS — W010XXA Fall on same level from slipping, tripping and stumbling without subsequent striking against object, initial encounter: Secondary | ICD-10-CM | POA: Diagnosis present

## 2021-03-14 DIAGNOSIS — E1165 Type 2 diabetes mellitus with hyperglycemia: Secondary | ICD-10-CM | POA: Diagnosis present

## 2021-03-14 DIAGNOSIS — I509 Heart failure, unspecified: Secondary | ICD-10-CM | POA: Diagnosis not present

## 2021-03-14 DIAGNOSIS — G8911 Acute pain due to trauma: Secondary | ICD-10-CM | POA: Diagnosis not present

## 2021-03-14 LAB — CBC WITH DIFFERENTIAL/PLATELET
Abs Immature Granulocytes: 0.03 10*3/uL (ref 0.00–0.07)
Basophils Absolute: 0.1 10*3/uL (ref 0.0–0.1)
Basophils Relative: 1 %
Eosinophils Absolute: 0.1 10*3/uL (ref 0.0–0.5)
Eosinophils Relative: 2 %
HCT: 36.2 % — ABNORMAL LOW (ref 39.0–52.0)
Hemoglobin: 12 g/dL — ABNORMAL LOW (ref 13.0–17.0)
Immature Granulocytes: 0 %
Lymphocytes Relative: 15 %
Lymphs Abs: 1 10*3/uL (ref 0.7–4.0)
MCH: 29.9 pg (ref 26.0–34.0)
MCHC: 33.1 g/dL (ref 30.0–36.0)
MCV: 90 fL (ref 80.0–100.0)
Monocytes Absolute: 0.5 10*3/uL (ref 0.1–1.0)
Monocytes Relative: 7 %
Neutro Abs: 5.1 10*3/uL (ref 1.7–7.7)
Neutrophils Relative %: 75 %
Platelets: 258 10*3/uL (ref 150–400)
RBC: 4.02 MIL/uL — ABNORMAL LOW (ref 4.22–5.81)
RDW: 14 % (ref 11.5–15.5)
WBC: 6.8 10*3/uL (ref 4.0–10.5)
nRBC: 0 % (ref 0.0–0.2)

## 2021-03-14 LAB — COMPREHENSIVE METABOLIC PANEL
ALT: 8 U/L (ref 0–44)
AST: 16 U/L (ref 15–41)
Albumin: 3.5 g/dL (ref 3.5–5.0)
Alkaline Phosphatase: 61 U/L (ref 38–126)
Anion gap: 6 (ref 5–15)
BUN: 21 mg/dL (ref 8–23)
CO2: 27 mmol/L (ref 22–32)
Calcium: 8.8 mg/dL — ABNORMAL LOW (ref 8.9–10.3)
Chloride: 106 mmol/L (ref 98–111)
Creatinine, Ser: 1.19 mg/dL (ref 0.61–1.24)
GFR, Estimated: 58 mL/min — ABNORMAL LOW (ref >60)
Glucose, Bld: 173 mg/dL — ABNORMAL HIGH (ref 70–99)
Potassium: 4.7 mmol/L (ref 3.5–5.1)
Sodium: 139 mmol/L (ref 135–145)
Total Bilirubin: 0.3 mg/dL (ref 0.3–1.2)
Total Protein: 6.3 g/dL — ABNORMAL LOW (ref 6.5–8.1)

## 2021-03-14 LAB — RESP PANEL BY RT-PCR (FLU A&B, COVID) ARPGX2
Influenza A by PCR: NEGATIVE
Influenza B by PCR: NEGATIVE
SARS Coronavirus 2 by RT PCR: NEGATIVE

## 2021-03-14 MED ORDER — METHOCARBAMOL 1000 MG/10ML IJ SOLN
500.0000 mg | Freq: Four times a day (QID) | INTRAVENOUS | Status: DC | PRN
  Filled 2021-03-14: qty 5

## 2021-03-14 MED ORDER — DOCUSATE SODIUM 100 MG PO CAPS
300.0000 mg | ORAL_CAPSULE | Freq: Two times a day (BID) | ORAL | Status: DC
  Administered 2021-03-15 – 2021-03-17 (×6): 300 mg via ORAL
  Filled 2021-03-14 (×6): qty 3

## 2021-03-14 MED ORDER — INSULIN ASPART 100 UNIT/ML IJ SOLN: 0.0000 [IU] | Freq: Three times a day (TID) | INTRAMUSCULAR | Status: DC

## 2021-03-14 MED ORDER — METHOCARBAMOL 500 MG PO TABS
500.0000 mg | ORAL_TABLET | Freq: Four times a day (QID) | ORAL | Status: DC | PRN
  Administered 2021-03-15: 500 mg via ORAL
  Filled 2021-03-14: qty 1

## 2021-03-14 MED ORDER — MINOXIDIL 10 MG PO TABS
10.0000 mg | ORAL_TABLET | Freq: Two times a day (BID) | ORAL | Status: DC
  Filled 2021-03-14: qty 1

## 2021-03-14 MED ORDER — MORPHINE SULFATE (PF) 2 MG/ML IV SOLN
0.5000 mg | INTRAVENOUS | Status: DC | PRN
  Administered 2021-03-15 (×3): 0.5 mg via INTRAVENOUS
  Filled 2021-03-14 (×3): qty 1

## 2021-03-14 MED ORDER — AMLODIPINE BESYLATE 10 MG PO TABS
10.0000 mg | ORAL_TABLET | Freq: Every day | ORAL | Status: DC
  Administered 2021-03-15 – 2021-03-22 (×8): 10 mg via ORAL
  Filled 2021-03-14 (×6): qty 1
  Filled 2021-03-14: qty 2
  Filled 2021-03-14: qty 1

## 2021-03-14 MED ORDER — ATORVASTATIN CALCIUM 40 MG PO TABS
40.0000 mg | ORAL_TABLET | Freq: Every day | ORAL | Status: DC
  Administered 2021-03-15 – 2021-03-22 (×8): 40 mg via ORAL
  Filled 2021-03-14 (×8): qty 1

## 2021-03-14 MED ORDER — SENNA-DOCUSATE SODIUM 8.6-50 MG PO TABS: 1.0000 | ORAL_TABLET | ORAL | Status: DC

## 2021-03-14 MED ORDER — GABAPENTIN 300 MG PO CAPS
300.0000 mg | ORAL_CAPSULE | Freq: Every day | ORAL | Status: DC
  Administered 2021-03-15 – 2021-03-17 (×4): 300 mg via ORAL
  Filled 2021-03-14 (×4): qty 1

## 2021-03-14 MED ORDER — LISINOPRIL 5 MG PO TABS
2.5000 mg | ORAL_TABLET | Freq: Every day | ORAL | Status: DC
Start: 2021-03-15 — End: 2021-03-22
  Administered 2021-03-15 – 2021-03-22 (×8): 2.5 mg via ORAL
  Filled 2021-03-14 (×8): qty 1

## 2021-03-14 MED ORDER — CLONIDINE HCL 0.1 MG PO TABS: 0.1000 mg | ORAL_TABLET | Freq: Two times a day (BID) | ORAL | Status: DC

## 2021-03-14 MED ORDER — ALBUTEROL SULFATE (2.5 MG/3ML) 0.083% IN NEBU: 3.0000 mL | INHALATION_SOLUTION | Freq: Four times a day (QID) | RESPIRATORY_TRACT | Status: DC | PRN

## 2021-03-14 MED ORDER — FENTANYL CITRATE PF 50 MCG/ML IJ SOSY
50.0000 ug | PREFILLED_SYRINGE | INTRAMUSCULAR | Status: AC | PRN
  Administered 2021-03-14 (×2): 50 ug via INTRAVENOUS
  Filled 2021-03-14 (×2): qty 1

## 2021-03-14 NOTE — ED Notes (Signed)
Received verbal report from Paden B RN at this time 

## 2021-03-14 NOTE — Progress Notes (Signed)
Chaplain Medinas-Lockley responding to page for pt [name] for trauma level 2. Pt was unavailable at time of visit and no family present at this time. Chaplain services remain available for follow-up spiritual/emotional support as needed. ? ?Rev. Katy Medinas-Lockley ? ? ? 03/14/21 2200  ?Clinical Encounter Type  ?Visited With Patient not available  ?Visit Type Initial;ED;Trauma  ? ? ?

## 2021-03-14 NOTE — H&P (Incomplete)
History and Physical    DONYE Harmon OEV:[id number] DOB: 1929/01/22 DOA: 03/14/2021  PCP: [name], MD  Patient coming from: ***  Chief Complaint: ***  HPI: [name] is a 88 y.o. male with ***   ED Course: ***  Review of Systems: As per HPI, rest all negative.   Past Medical History:  Diagnosis Date   Acute bronchitis 06/10/2015   Allergic rhinitis    Anemia    Asthma    Carotid stenosis    a. s/p Left CEA 2002;  b. Carotid US 3/16:  patent R CEA, L < 40%   Chronic pain syndrome    Left shoulder, back   Diverticulosis    Dyslipidemia    Epigastric pain 04/05/2016   Esophageal stricture    Distal, benign   GERD (gastroesophageal reflux disease)    Hemorrhoids    HTN (hypertension)    Negative renal duplex 07-22-11   Hx of echocardiogram    a. Echo 10/11: mod LVH, EF 55-60%   Pleural effusion 07/17/2020   Prostate cancer ([place]) 2000   Seed XRT   Spondylosis, cervical, with myelopathy 11/26/2015   Stroke ([place]) 8/01   right thalamic - on chronic Plavix/ASA    Past Surgical History:  Procedure Laterality Date   APPENDECTOMY  1953   CAROTID ENDARTERECTOMY Right 01/2000   CATARACT EXTRACTION Bilateral    CERVICAL [place] SURGERY  8/07   CHEST TUBE INSERTION Left 11/11/2020   Procedure: INSERTION PLEURAL DRAINAGE CATHETER;  Surgeon: [name], DO;  Location: [facility];  Service: Pulmonary;  Laterality: Left;  Indwelling Pleural Catheter   HIP ARTHROPLASTY Left 10/24/2018   Procedure: ARTHROPLASTY BIPOLAR HIP (HEMIARTHROPLASTY);  Surgeon: Paralee Cancel, MD;  Location: [facility];  Service: Orthopedics;  Laterality: Left;   INSERTION PROSTATE RADIATION SEED  2000   LUMBAR DISC SURGERY  1990s   NASAL SINUS SURGERY     x 3   REMOVAL OF PLEURAL DRAINAGE CATHETER N/A 12/24/2020   Procedure: MINOR REMOVAL OF PLEURAL DRAINAGE CATHETER;  Surgeon: [name], DO;  Location: [facility];  Service: Cardiopulmonary;  Laterality: N/A;   THORACENTESIS N/A 08/15/2020    Procedure: THORACENTESIS;  Surgeon: [name], DO;  Location: [facility];  Service: Pulmonary;  Laterality: N/A;     reports that he quit smoking about 43 years ago. His smoking use included cigarettes. He started smoking about 73 years ago. He has a 8.75 pack-year smoking history. His smokeless tobacco use includes chew. He reports that he does not drink alcohol and does not use drugs.  Allergies  Allergen Reactions   Hctz [Hydrochlorothiazide] Shortness Of Breath, Swelling and Other (See Comments)    "Swelling and dyspnea"    Hydralazine Shortness Of Breath and Other (See Comments)    Chest pain and GI issues also   Diovan [Valsartan] Other (See Comments)    Elevated potassium- Hyperkalemia   Metformin And Related Other (See Comments)    Reaction not recalled   Nsaids Other (See Comments)    Other than Tylenol, he isn't to have these because he's on Eliquis   Other Other (See Comments)    Unnamed topically-applied B/P patch = Caused redness   Prednisone Other (See Comments)    Suicidal thoughts   Spironolactone Swelling    Site of swelling not recalled   Codeine Itching and Nausea Only   Oxycodone-Acetaminophen Itching and Nausea Only    Family History  Problem Relation Age of Onset  Stroke Brother    Stroke Sister    Stroke Mother    Hyperlipidemia Mother    Hypertension Mother    Lung disease Father    Diabetes Son    Gout Son    Obesity Sister    Alcohol abuse Brother    Cancer Brother        lung, smoker    Prior to Admission medications   Medication Sig Start Date End Date Taking? Authorizing Provider  acetaminophen (TYLENOL) 500 MG tablet Take 1,000 mg by mouth every 6 (six) hours as needed for mild pain or headache.   Yes [provider]  albuterol (VENTOLIN HFA) 108 (90 Base) MCG/ACT inhaler INHALE 2 PUFFS BY MOUTH INTO THE LUNGS EVERY 6 HOURS AS NEEDED Patient taking differently: Inhale 2 puffs into the lungs every 6 (six) hours as  needed for shortness of breath or wheezing. 02/13/20 03/14/21 Yes Saguier, Percell Miller, PA-C  amLODipine (NORVASC) 5 MG tablet TAKE 2 TABLETS BY MOUTH DAILY Patient taking differently: Take 10 mg by mouth daily. 12/23/20  Yes Fay Records, MD  apixaban (ELIQUIS) 5 MG TABS tablet TAKE 1 TABLET BY MOUTH 2 TIMES DAILY Patient taking differently: Take 5 mg by mouth 2 (two) times daily. 11/06/20 11/06/21 Yes Fay Records, MD  atorvastatin (LIPITOR) 40 MG tablet Take 1 tablet by mouth daily. 02/10/21  Yes [name], MD  benzonatate (TESSALON) 100 MG capsule Take 1 capsule by mouth 3 times daily as needed for cough. 02/21/21  Yes Saguier, Percell Miller, PA-C  Cholecalciferol (VITAMIN D3 PO) Take 1 tablet by mouth in the morning. Unsure of how many units   Yes [provider]  cloNIDine (CATAPRES) 0.1 MG tablet TAKE 1 TABLET BY MOUTH 2 TIMES DAILY Patient taking differently: Take 0.1 mg by mouth 2 (two) times daily. 12/16/20  Yes Fay Records, MD  docusate sodium (COLACE) 100 MG capsule Take 300 mg by mouth 2 (two) times daily.   Yes [provider]  ferrous sulfate (FERROUSUL) 325 (65 FE) MG tablet Take 1 tablet (325 mg total) by mouth 3 (three) times daily with meals for 14 days. Patient taking differently: Take 325 mg by mouth in the morning and at bedtime. 11/24/18 11/07/22 Yes [name], MD  fluticasone (FLOVENT HFA) 110 MCG/ACT inhaler Inhale 2 puffs into the lungs 2 (two) times daily. Patient taking differently: Inhale 2 puffs into the lungs 2 (two) times daily as needed (respiratory issues.). 06/03/18  Yes Fay Records, MD  gabapentin (NEURONTIN) 300 MG capsule TAKE 1 CAPSULE BY MOUTH 3 TIMES DAILY Patient taking differently: Take 300 mg by mouth at bedtime. 12/23/20 12/23/21 Yes [name], MD  lisinopril (ZESTRIL) 2.5 MG tablet TAKE 1 TABLET BY MOUTH DAILY. Patient taking differently: Take 2.5 mg by mouth daily. 03/12/21  Yes [name], MD  metoCLOPramide (REGLAN) 5 MG  tablet TAKE 1 TABLET BY MOUTH AT BEDTIME. Patient taking differently: Take 5 mg by mouth at bedtime. 12/09/20 12/09/21 Yes Gatha Mayer, MD  minoxidil (LONITEN) 10 MG tablet TAKE 1 TABLET BY MOUTH TWICE DAILY Patient taking differently: Take 10 mg by mouth 2 (two) times daily. 08/30/20 08/30/21 Yes Fay Records, MD  ondansetron (ZOFRAN ODT) 8 MG disintegrating tablet Dissolve 1 tablet by mouth every 8 hours as needed for nausea or vomiting. 08/23/20  Yes [name], MD  pantoprazole (PROTONIX) 40 MG tablet TAKE 1 TABLET BY MOUTH ONCE A DAY BEFORE BREAKFAST Patient taking  differently: Take 40 mg by mouth daily. 03/18/20 03/30/21 Yes Gatha Mayer, MD  polyvinyl alcohol (LIQUIFILM TEARS) 1.4 % ophthalmic solution Place 1 drop into both eyes 5 (five) times daily as needed for dry eyes.   Yes [provider]  sennosides-docusate sodium (SENOKOT-S) 8.6-50 MG tablet Take 1-2 tablets by mouth See admin instructions. Take 1 tablet in the morning & take 2 tablet at nigh   Yes [provider]  traMADol (ULTRAM) 50 MG tablet TAKE 1 TABLET BY MOUTH 3 TIMES DAILY AS NEEDED FOR MODERATE PAIN Patient taking differently: Take 50 mg by mouth 3 (three) times daily as needed for moderate pain. 03/12/21 09/08/21 Yes Wendling, Crosby Oyster, DO  triamcinolone cream (KENALOG) 0.5 % Apply 1 application topically daily as needed (for skin irritation).  01/22/12  Yes Janith Lima, MD  COVID-19 mRNA Vac-TriS, Pfizer, SUSP injection Inject into the muscle. 08/30/20   Carlyle Basques, MD  famotidine (PEPCID) 40 MG tablet Take 1 tablet (40 mg total) by mouth daily. Patient not taking: Reported on 03/14/2021 08/06/20   [name], MD  furosemide (LASIX) 40 MG tablet Take 1 tablet (40 mg) by mouth daily alternating with 1/2 tablet (20 mg) every other day Patient not taking: Reported on 03/14/2021 09/19/20   Fay Records, MD    Physical Exam: Constitutional: *** Vitals:   03/14/21 2051 03/14/21 2053  03/14/21 2053 03/14/21 2130  BP: (!) 184/59  (!) 200/58 (!) 185/54  Pulse: 99   70  Resp: 18   10  Temp: 98.3 F (36.8 C)     TempSrc: Oral     SpO2: 96%   93%  Weight:  66.2 kg    Height:  5\' 11"  (1.803 m)     Eyes: *** ENMT: *** Neck: *** Respiratory: *** Cardiovascular: *** Abdomen: *** Musculoskeletal: *** Skin: *** Neurologic:*** Psychiatric: ***   Labs on Admission: I have personally reviewed following labs and imaging studies  CBC: Recent Labs  Lab 03/14/21 2048  WBC 6.8  NEUTROABS 5.1  HGB 12.0*  HCT 36.2*  MCV 90.0  PLT 798   Basic Metabolic Panel: Recent Labs  Lab 03/14/21 2048  NA 139  K 4.7  CL 106  CO2 27  GLUCOSE 173*  BUN 21  CREATININE 1.19  CALCIUM 8.8*   GFR: Estimated Creatinine Clearance: 39.4 mL/min (by C-G formula based on SCr of 1.19 mg/dL). Liver Function Tests: Recent Labs  Lab 03/14/21 2048  AST 16  ALT 8  ALKPHOS 61  BILITOT 0.3  PROT 6.3*  ALBUMIN 3.5   No results for input(s): LIPASE, AMYLASE in the last 168 hours. No results for input(s): AMMONIA in the last 168 hours. Coagulation Profile: No results for input(s): INR, PROTIME in the last 168 hours. Cardiac Enzymes: No results for input(s): CKTOTAL, CKMB, CKMBINDEX, TROPONINI in the last 168 hours. BNP (last 3 results) Recent Labs    08/12/20 1601 08/27/20 1529 09/17/20 1559  PROBNP 156.0* 1,372* 1,107*   HbA1C: No results for input(s): HGBA1C in the last 72 hours. CBG: No results for input(s): GLUCAP in the last 168 hours. Lipid Profile: No results for input(s): CHOL, HDL, LDLCALC, TRIG, CHOLHDL, LDLDIRECT in the last 72 hours. Thyroid Function Tests: No results for input(s): TSH, T4TOTAL, FREET4, T3FREE, THYROIDAB in the last 72 hours. Anemia Panel: No results for input(s): VITAMINB12, FOLATE, FERRITIN, TIBC, IRON, RETICCTPCT in the last 72 hours. Urine analysis:    Component Value Date/Time   COLORURINE YELLOW  10/26/2018 0014   APPEARANCEUR  CLEAR 10/26/2018 0014   LABSPEC 1.018 10/26/2018 0014   PHURINE 5.0 10/26/2018 0014   GLUCOSEU NEGATIVE 10/26/2018 0014   GLUCOSEU 100 (A) 12/01/2016 1858   HGBUR SMALL (A) 10/26/2018 0014   HGBUR 2+ 12/11/2008 1025   BILIRUBINUR NEGATIVE 10/26/2018 0014   BILIRUBINUR 1+ 02/23/2018 1641   KETONESUR NEGATIVE 10/26/2018 0014   PROTEINUR 100 (A) 10/26/2018 0014   UROBILINOGEN negative (A) 02/23/2018 1641   UROBILINOGEN 0.2 12/01/2016 1858   NITRITE NEGATIVE 10/26/2018 0014   LEUKOCYTESUR NEGATIVE 10/26/2018 0014   Sepsis Labs: @LABRCNTIP (procalcitonin:4,lacticidven:4) ) Recent Results (from the past 240 hour(s))  Resp Panel by RT-PCR (Flu A&B, Covid) Nasopharyngeal Swab     Status: None   Collection Time: 03/14/21  9:34 PM   Specimen: Nasopharyngeal Swab; Nasopharyngeal(NP) swabs in vial transport medium  Result Value Ref Range Status   SARS Coronavirus 2 by RT PCR NEGATIVE NEGATIVE Final    Comment: (NOTE) SARS-CoV-2 target nucleic acids are NOT DETECTED.  The SARS-CoV-2 RNA is generally detectable in upper respiratory specimens during the acute phase of infection. The lowest concentration of SARS-CoV-2 viral copies this assay can detect is 138 copies/mL. A negative result does not preclude SARS-Cov-2 infection and should not be used as the sole basis for treatment or other patient management decisions. A negative result may occur with  improper specimen collection/handling, submission of specimen other than nasopharyngeal swab, presence of viral mutation(s) within the areas targeted by this assay, and inadequate number of viral copies(<138 copies/mL). A negative result must be combined with clinical observations, patient history, and epidemiological information. The expected result is Negative.  Fact Sheet for Patients:  EntrepreneurPulse.com.au  Fact Sheet for Healthcare Providers:  IncredibleEmployment.be  This test is no t yet  approved or cleared by the [place] FDA and  has been authorized for detection and/or diagnosis of SARS-CoV-2 by FDA under an Emergency Use Authorization (EUA). This EUA will remain  in effect (meaning this test can be used) for the duration of the COVID-19 declaration under Section 564(b)(1) of the Act, 21 U.S.C.section 360bbb-3(b)(1), unless the authorization is terminated  or revoked sooner.       Influenza A by PCR NEGATIVE NEGATIVE Final   Influenza B by PCR NEGATIVE NEGATIVE Final    Comment: (NOTE) The Xpert Xpress SARS-CoV-2/FLU/RSV plus assay is intended as an aid in the diagnosis of influenza from Nasopharyngeal swab specimens and should not be used as a sole basis for treatment. Nasal washings and aspirates are unacceptable for Xpert Xpress SARS-CoV-2/FLU/RSV testing.  Fact Sheet for Patients: EntrepreneurPulse.com.au  Fact Sheet for Healthcare Providers: IncredibleEmployment.be  This test is not yet approved or cleared by the [place] FDA and has been authorized for detection and/or diagnosis of SARS-CoV-2 by FDA under an Emergency Use Authorization (EUA). This EUA will remain in effect (meaning this test can be used) for the duration of the COVID-19 declaration under Section 564(b)(1) of the Act, 21 U.S.C. section 360bbb-3(b)(1), unless the authorization is terminated or revoked.  Performed at [facility], [place] [address] [place], [place] [address]      Radiological Exams on Admission: CT Head Wo Contrast  Result Date: 03/14/2021 CLINICAL DATA:  Provided history: Head trauma, minor. Neck trauma. Additional history provided: Fall on blood thinners. EXAM: CT HEAD WITHOUT CONTRAST CT CERVICAL SPINE WITHOUT CONTRAST TECHNIQUE: Multidetector CT imaging of the head and cervical spine was performed following the standard protocol without intravenous contrast. Multiplanar CT  image reconstructions of the cervical spine  were also generated. RADIATION DOSE REDUCTION: This exam was performed according to the departmental dose-optimization program which includes automated exposure control, adjustment of the mA and/or kV according to patient size and/or use of iterative reconstruction technique. COMPARISON:  CT head/cervical spine 10/23/2018. FINDINGS: CT HEAD FINDINGS Brain: No age advanced or lobar predominant atrophy. Redemonstrated chronic cortical/subcortical infarct within the right occipital lobe. Mild patchy and ill-defined hypoattenuation elsewhere within the cerebral white matter, nonspecific but compatible with chronic small vessel ischemic disease. Redemonstrated chronic lacunar infarct in the right thalamus. Small focal hypodensity within the inferior left basal ganglia, likely reflecting a prominent perivascular space. Unchanged small chronic infarct within the left cerebellar hemisphere. There is no acute intracranial hemorrhage. No acute demarcated cortical infarct. No extra-axial fluid collection. No evidence of an intracranial mass. No midline shift. Vascular: No hyperdense vessel.  Atherosclerotic calcifications. Skull: Normal. Negative for fracture or focal lesion. Sinuses/Orbits: Visualized orbits show no acute finding. As before, the right maxillary sinus is asymmetrically small as compared to the left. Mild mucosal thickening and fluid within the right maxillary sinus. Moderate fluid and mucosal thickening within the left maxillary sinus. Redemonstrated small chronic defect within the anterior wall of the left maxillary sinus. Both maxillary sinuses also demonstrate chronic reactive osteitis. CT CERVICAL SPINE FINDINGS Alignment: No significant spondylolisthesis. Skull base and vertebrae: The basion-dental and atlanto-dental intervals are maintained.No evidence of acute fracture to the cervical spine. Prior C4-C6 ACDF. No evidence of acute hardware compromise. Vertebral ankylosis at the operative levels. Soft  tissues and spinal canal: No prevertebral fluid or swelling. No visible canal hematoma. Surgical clips within the right neck. Disc levels: Prior C4-C6 ACDF. No evidence of acute hardware compromise. Superimposed cervical spondylosis with multilevel disc bulges, endplate spurring, uncovertebral hypertrophy and facet arthrosis. Disc space narrowing is greatest at C6-C7 (advanced at this level). No appreciable high-grade spinal canal stenosis. Multilevel bony neural foraminal narrowing. Upper chest: No consolidation within the imaged lung apices. No visible pneumothorax. IMPRESSION: CT head: 1. No evidence of acute intracranial abnormality. 2. Redemonstrated chronic infarcts within the right thalamus and right occipital lobe. 3. Background mild chronic small vessel image changes within the cerebral white matter. 4. Redemonstrated small chronic infarct within the left cerebellar hemisphere. 5. Chronic bilateral maxillary sinusitis. CT cervical spine: 1. No evidence of acute fracture to the cervical spine. 2. Prior C4-C6 ACDF. No evidence of acute hardware compromise. Vertebral ankylosis at these levels. 3. Superimposed cervical spondylosis, as described. Electronically Signed   By: [name] D.O.   On: 03/14/2021 21:27   CT Cervical Spine Wo Contrast  Result Date: 03/14/2021 CLINICAL DATA:  Provided history: Head trauma, minor. Neck trauma. Additional history provided: Fall on blood thinners. EXAM: CT HEAD WITHOUT CONTRAST CT CERVICAL SPINE WITHOUT CONTRAST TECHNIQUE: Multidetector CT imaging of the head and cervical spine was performed following the standard protocol without intravenous contrast. Multiplanar CT image reconstructions of the cervical spine were also generated. RADIATION DOSE REDUCTION: This exam was performed according to the departmental dose-optimization program which includes automated exposure control, adjustment of the mA and/or kV according to patient size and/or use of iterative  reconstruction technique. COMPARISON:  CT head/cervical spine 10/23/2018. FINDINGS: CT HEAD FINDINGS Brain: No age advanced or lobar predominant atrophy. Redemonstrated chronic cortical/subcortical infarct within the right occipital lobe. Mild patchy and ill-defined hypoattenuation elsewhere within the cerebral white matter, nonspecific but compatible with chronic small vessel ischemic disease. Redemonstrated chronic lacunar infarct in the  right thalamus. Small focal hypodensity within the inferior left basal ganglia, likely reflecting a prominent perivascular space. Unchanged small chronic infarct within the left cerebellar hemisphere. There is no acute intracranial hemorrhage. No acute demarcated cortical infarct. No extra-axial fluid collection. No evidence of an intracranial mass. No midline shift. Vascular: No hyperdense vessel.  Atherosclerotic calcifications. Skull: Normal. Negative for fracture or focal lesion. Sinuses/Orbits: Visualized orbits show no acute finding. As before, the right maxillary sinus is asymmetrically small as compared to the left. Mild mucosal thickening and fluid within the right maxillary sinus. Moderate fluid and mucosal thickening within the left maxillary sinus. Redemonstrated small chronic defect within the anterior wall of the left maxillary sinus. Both maxillary sinuses also demonstrate chronic reactive osteitis. CT CERVICAL SPINE FINDINGS Alignment: No significant spondylolisthesis. Skull base and vertebrae: The basion-dental and atlanto-dental intervals are maintained.No evidence of acute fracture to the cervical spine. Prior C4-C6 ACDF. No evidence of acute hardware compromise. Vertebral ankylosis at the operative levels. Soft tissues and spinal canal: No prevertebral fluid or swelling. No visible canal hematoma. Surgical clips within the right neck. Disc levels: Prior C4-C6 ACDF. No evidence of acute hardware compromise. Superimposed cervical spondylosis with multilevel disc  bulges, endplate spurring, uncovertebral hypertrophy and facet arthrosis. Disc space narrowing is greatest at C6-C7 (advanced at this level). No appreciable high-grade spinal canal stenosis. Multilevel bony neural foraminal narrowing. Upper chest: No consolidation within the imaged lung apices. No visible pneumothorax. IMPRESSION: CT head: 1. No evidence of acute intracranial abnormality. 2. Redemonstrated chronic infarcts within the right thalamus and right occipital lobe. 3. Background mild chronic small vessel image changes within the cerebral white matter. 4. Redemonstrated small chronic infarct within the left cerebellar hemisphere. 5. Chronic bilateral maxillary sinusitis. CT cervical spine: 1. No evidence of acute fracture to the cervical spine. 2. Prior C4-C6 ACDF. No evidence of acute hardware compromise. Vertebral ankylosis at these levels. 3. Superimposed cervical spondylosis, as described. Electronically Signed   By: [name] D.O.   On: 03/14/2021 21:27   DG Pelvis Portable  Result Date: 03/14/2021 CLINICAL DATA:  Trauma, fall, right hip pain. EXAM: PORTABLE PELVIS 1-2 VIEWS COMPARISON:  Pelvis radiograph 10/24/2018 FINDINGS: Displaced right femoral neck fracture. There is apex lateral angulation and slight migration of the femoral shaft. Underlying right hip osteoarthritis. Left hip arthroplasty is partially visualized, intact where included. Pubic rami are intact. Brachytherapy seeds or surgical clips in the prostate bed. IMPRESSION: Displaced and angulated right femoral neck fracture. Electronically Signed   By: Keith Rake M.D.   On: 03/14/2021 21:06   DG Chest Portable 1 View  Result Date: 03/14/2021 CLINICAL DATA:  Fall, trauma. EXAM: PORTABLE CHEST 1 VIEW COMPARISON:  Radiograph 02/05/2021, CT 07/09/2020 FINDINGS: Anti lordotic positioning, the patient is rotated. Chronic cardiomegaly. Aortic atherosclerosis. Unchanged blunting of left costophrenic angle due to chronic pleural  effusion. No pneumothorax. No acute airspace disease. No acute osseous abnormalities are seen on this limited portable exam. IMPRESSION: 1. No acute findings. 2. Chronic cardiomegaly and left pleural effusion. 3. Limited rotated exam. Electronically Signed   By: Keith Rake M.D.   On: 03/14/2021 21:04   DG Femur Min 2 Views Right  Result Date: 03/14/2021 CLINICAL DATA:  Fall, right hip pain. EXAM: RIGHT FEMUR 2 VIEWS COMPARISON:  None. FINDINGS: Displaced right femoral neck fracture with slight proximal migration of the femoral shaft. The distal femur is intact. Knee alignment is difficult to assess on provided views. Overlying artifacts. IMPRESSION: Displaced right femoral neck  fracture. Electronically Signed   By: Keith Rake M.D.   On: 03/14/2021 21:07    EKG: Independently reviewed. ***  Assessment/Plan Principal Problem:   Hip fracture (HCC) Active Problems:   HLD (hyperlipidemia)   Essential hypertension   Controlled type 2 diabetes mellitus with hyperglycemia, without long-term current use of insulin (HCC)   PAF (paroxysmal atrial fibrillation) (HCC)    ***   DVT prophylaxis: *** Code Status: ***  Family Communication: ***  Disposition Plan: ***  Consults called: ***  Admission status: ***    Rise Patience MD [facility] Pager [phone].  If 7PM-7AM, please contact night-coverage [email or web address] Password [facility]  03/14/2021, 11:54 PM

## 2021-03-14 NOTE — ED Triage Notes (Signed)
Pt BIB [facility] from [facility] after losing his balance and falling. EMS reports pt is taking eliquis and has shortening and rotation to his right leg. No LOC.  ?

## 2021-03-14 NOTE — ED Notes (Addendum)
Trauma Response Nurse Documentation ? ? ?[name] is a 88 y.o. male arriving to [facility] ED via EMS ? ?On Eliquis (apixaban) daily. Trauma was activated as a Level 2 by charge nurse based on the following trauma criteria Elderly patients > 65 with head trauma on anti-coagulation (excluding ASA). Trauma team at the bedside on patient arrival. Patient cleared for CT by Dr. Maryan Rued. Patient to CT with team. GCS 15. ? ?History  ? Past Medical History:  ?Diagnosis Date  ? Acute bronchitis 06/10/2015  ? Allergic rhinitis   ? Anemia   ? Asthma   ? Carotid stenosis   ? a. s/p Left CEA 2002;  b. Carotid US 3/16:  patent R CEA, L < 40%  ? Chronic pain syndrome   ? Left shoulder, back  ? Diverticulosis   ? Dyslipidemia   ? Epigastric pain 04/05/2016  ? Esophageal stricture   ? Distal, benign  ? GERD (gastroesophageal reflux disease)   ? Hemorrhoids   ? HTN (hypertension)   ? Negative renal duplex 07-22-11  ? Hx of echocardiogram   ? a. Echo 10/11: mod LVH, EF 55-60%  ? Pleural effusion 07/17/2020  ? Prostate cancer ([place]) 2000  ? Seed XRT  ? Spondylosis, cervical, with myelopathy 11/26/2015  ? Stroke [facility]) 8/01  ? right thalamic - on chronic Plavix/ASA  ?  ? Past Surgical History:  ?Procedure Laterality Date  ? APPENDECTOMY  1953  ? CAROTID ENDARTERECTOMY Right 01/2000  ? CATARACT EXTRACTION Bilateral   ? CERVICAL DISC SURGERY  8/07  ? CHEST TUBE INSERTION Left 11/11/2020  ? Procedure: INSERTION PLEURAL DRAINAGE CATHETER;  Surgeon: [name], DO;  Location: [facility];  Service: Pulmonary;  Laterality: Left;  Indwelling Pleural Catheter  ? HIP ARTHROPLASTY Left 10/24/2018  ? Procedure: ARTHROPLASTY BIPOLAR HIP (HEMIARTHROPLASTY);  Surgeon: Paralee Cancel, MD;  Location: [place];  Service: Orthopedics;  Laterality: Left;  ? INSERTION PROSTATE RADIATION SEED  2000  ? [place] SUR[facility]ERY  1990s  ? NASAL SINUS SURGERY    ? x 3  ? REMOVAL OF PLEURAL DRAINAGE CATHETER N/A 12/24/2020  ? Procedure: MINOR REMOVAL OF PLEURAL  DRAINAGE CATHETER;  Surgeon: [name], DO;  Location: [facility];  Service: Cardiopulmonary;  Laterality: N/A;  ? THORACENTESIS N/A 08/15/2020  ? Procedure: THORACENTESIS;  Surgeon: [name], DO;  Location: [facility];  Service: Pulmonary;  Laterality: N/A;  ?  ? ? ? ?Initial Focused Assessment (If applicable, or please see trauma documentation): ?See trauma charting ? ?CT's Completed:   ?CT Head and CT C-Spine  ? ?Interventions:  ?Trauma assessment ?IV placement ?Lab draw ?Transport to/fr CT ? ?Plan for disposition:  ?Admission to floor  ? ?Consults completed:  ?Orthopaedic Surgeon at 2126, Dr. Markus Jarvis consult, formal consult in am ? ?Event Summary: ?Pt transported via BLS from home after mechanical fall, pt c/o R hip pain, Sheet tied around pelvis by EMS. No IV or CT collar in place on arrival. Pt confirms he does take Eliquis, family enroute to ED. IV est, labs drawn,  Pt transported to CT and back without incident, primary RN obtaining order for pain control.  ?GCS 15, femoral neck fx on port pelvis xray.  ? ?MTP Summary (If applicable):  ? ?Bedside handoff with ED RN P[place].   ? ?[place], [place]  ?Trauma Response RN ? ?Please call TRN at [phone] for further assistance. ?. ?

## 2021-03-14 NOTE — Progress Notes (Signed)
Patient with displaced right femoral neck fracture. Anticipate he will need a right hip hemiarthroplasty possible tomorrow pending medical clearance and OR availability. Full consult note to follow in the AM. ?

## 2021-03-14 NOTE — ED Provider Notes (Signed)
[facility] EMERGENCY DEPARTMENT  Provider Note  CSN: [id number] Arrival date & time: 03/14/21 2038  History Chief Complaint  Patient presents with   [name] is a 88 y.o. male presents after a fall.  Patient lost his balance at a restaurant tonight and fell.  Fall was mechanical in nature.  Landed on his right leg.  Sudden onset of pain.  Pain has been 10/10 since onset.  It has been constant.  Worsens with any movement.  He states he did not hit his head or lose consciousness.  He is on Eliquis.   Home Medications Prior to Admission medications   Medication Sig Start Date End Date Taking? Authorizing Provider  acetaminophen (TYLENOL) 500 MG tablet Take 1,000 mg by mouth every 6 (six) hours as needed for mild pain or headache.    [provider]  albuterol (VENTOLIN HFA) 108 (90 Base) MCG/ACT inhaler INHALE 2 PUFFS BY MOUTH INTO THE LUNGS EVERY 6 HOURS AS NEEDED Patient taking differently: Inhale 2 puffs into the lungs every 6 (six) hours as needed for shortness of breath or wheezing. 02/13/20 02/12/21  Saguier, Percell Miller, PA-C  amLODipine (NORVASC) 5 MG tablet TAKE 2 TABLETS BY MOUTH DAILY Patient taking differently: Take 10 mg by mouth daily. 12/23/20   Fay Records, MD  apixaban (ELIQUIS) 5 MG TABS tablet TAKE 1 TABLET BY MOUTH 2 TIMES DAILY Patient taking differently: Take 5 mg by mouth 2 (two) times daily. 11/06/20 11/06/21  Fay Records, MD  atorvastatin (LIPITOR) 40 MG tablet Take 1 tablet by mouth daily. 02/10/21   [name], MD  benzonatate (TESSALON) 100 MG capsule Take 1 capsule by mouth 3 times daily as needed for cough. 02/21/21   Saguier, Percell Miller, PA-C  Cholecalciferol (VITAMIN D3 PO) Take 1 tablet by mouth in the morning.    [provider]  cloNIDine (CATAPRES) 0.1 MG tablet TAKE 1 TABLET BY MOUTH 2 TIMES DAILY Patient taking differently: Take 0.1 mg by mouth 2 (two) times daily. 12/16/20   Fay Records, MD  COVID-19 mRNA  Vac-TriS, Pfizer, SUSP injection Inject into the muscle. 08/30/20   Carlyle Basques, MD  docusate sodium (COLACE) 100 MG capsule Take 300 mg by mouth 2 (two) times daily.    [provider]  famotidine (PEPCID) 40 MG tablet Take 1 tablet (40 mg total) by mouth daily. 08/06/20   [name], MD  ferrous sulfate (FERROUSUL) 325 (65 FE) MG tablet Take 1 tablet (325 mg total) by mouth 3 (three) times daily with meals for 14 days. Patient taking differently: Take 325 mg by mouth in the morning and at bedtime. 11/24/18 11/07/22  [name], MD  fluticasone (FLOVENT HFA) 110 MCG/ACT inhaler Inhale 2 puffs into the lungs 2 (two) times daily. Patient taking differently: Inhale 2 puffs into the lungs 2 (two) times daily as needed (respiratory issues.). 06/03/18   Fay Records, MD  furosemide (LASIX) 40 MG tablet Take 1 tablet (40 mg) by mouth daily alternating with 1/2 tablet (20 mg) every other day 09/19/20   Fay Records, MD  gabapentin (NEURONTIN) 300 MG capsule TAKE 1 CAPSULE BY MOUTH 3 TIMES DAILY Patient taking differently: Take 300 mg by mouth 3 (three) times daily. 12/23/20 12/23/21  [name], MD  lisinopril (ZESTRIL) 2.5 MG tablet TAKE 1 TABLET BY MOUTH DAILY. Patient taking differently: Take 2.5 mg by mouth daily. 03/12/21   [name],  MD  metoCLOPramide (REGLAN) 5 MG tablet TAKE 1 TABLET BY MOUTH AT BEDTIME. Patient taking differently: Take 5 mg by mouth at bedtime. 12/09/20 12/09/21  Gatha Mayer, MD  minoxidil (LONITEN) 10 MG tablet TAKE 1 TABLET BY MOUTH TWICE DAILY Patient taking differently: Take 10 mg by mouth 2 (two) times daily. 08/30/20 08/30/21  Fay Records, MD  ondansetron (ZOFRAN ODT) 8 MG disintegrating tablet Dissolve 1 tablet by mouth every 8 hours as needed for nausea or vomiting. 08/23/20   [name], MD  pantoprazole (PROTONIX) 40 MG tablet TAKE 1 TABLET BY MOUTH ONCE A DAY BEFORE BREAKFAST Patient taking differently: Take 40 mg by mouth daily.  03/18/20 03/30/21  Gatha Mayer, MD  polyvinyl alcohol (LIQUIFILM TEARS) 1.4 % ophthalmic solution Place 1 drop into both eyes 5 (five) times daily as needed for dry eyes.    [provider]  sennosides-docusate sodium (SENOKOT-S) 8.6-50 MG tablet Take 1-2 tablets by mouth See admin instructions. Take 1 tablet in the morning & take 2 tablet at nigh    [provider]  traMADol (ULTRAM) 50 MG tablet TAKE 1 TABLET BY MOUTH 3 TIMES DAILY AS NEEDED FOR MODERATE PAIN Patient taking differently: Take 50 mg by mouth 3 (three) times daily as needed for moderate pain. 03/12/21 09/08/21  Shelda Pal, DO  triamcinolone cream (KENALOG) 0.5 % Apply 1 application topically daily as needed (for skin irritation).  01/22/12   Janith Lima, MD     Allergies    Hctz [hydrochlorothiazide], Hydralazine, Diovan [valsartan], Metformin and related, Nsaids, Other, Prednisone, Spironolactone, Codeine, and Oxycodone-acetaminophen   Review of Systems   Review of Systems  Constitutional:  Negative for chills and fever.  HENT:  Negative for ear pain and sore throat.   Eyes:  Negative for pain and visual disturbance.  Respiratory:  Negative for cough and shortness of breath.   Cardiovascular:  Negative for chest pain and palpitations.  Gastrointestinal:  Negative for abdominal pain and vomiting.  Genitourinary:  Negative for dysuria and hematuria.  Musculoskeletal:  Positive for arthralgias and myalgias. Negative for back pain.  Skin:  Negative for color change and rash.  Neurological:  Negative for seizures and syncope.  All other systems reviewed and are negative. Please see HPI for pertinent positives and negatives  Physical Exam BP (!) 185/54    Pulse 70    Temp 98.3 F (36.8 C) (Oral)    Resp 10    Ht 5\' 11"  (1.803 m)    Wt 66.2 kg    SpO2 93%    BMI 20.36 kg/m   Physical Exam Vitals and nursing note reviewed.  Constitutional:      General: He is not in acute distress.     Appearance: He is well-developed.  HENT:     Head: Normocephalic and atraumatic.  Eyes:     Conjunctiva/sclera: Conjunctivae normal.  Cardiovascular:     Rate and Rhythm: Normal rate and regular rhythm.     Heart sounds: No murmur heard. Pulmonary:     Effort: Pulmonary effort is normal. No respiratory distress.     Breath sounds: Normal breath sounds.  Abdominal:     Palpations: Abdomen is soft.     Tenderness: There is no abdominal tenderness.  Musculoskeletal:        General: No swelling.     Cervical back: Neck supple.     Comments: Right leg is shortened and externally rotated.  Intact distal pulse and  sensation.  Skin:    General: Skin is warm and dry.     Capillary Refill: Capillary refill takes less than 2 seconds.  Neurological:     Mental Status: He is alert.  Psychiatric:        Mood and Affect: Mood normal.    ED Results / Procedures / Treatments   EKG None  Procedures Procedures  Medications Ordered in the ED Medications  fentaNYL (SUBLIMAZE) injection 50 mcg (50 mcg Intravenous Given 03/14/21 2236)     ED Course       MDM   This patient presents to the ED for concern of a fall, this involves an extensive number of treatment options, and is a complaint that carries with it a high risk of complications and morbidity.  The differential diagnosis includes traumatic injuries. Patients presentation is complicated by their history of anticoagulation with Eliquis.   Additional history obtained: Additional history obtained from EMS  Records reviewed previous admission documents, Care Everywhere/External Records, and Primary Care Documents  Lab Tests: I Ordered, and personally interpreted labs.  The pertinent results include: Largely unremarkable  Imaging Studies ordered: I ordered imaging studies including CT scan head and neck and X-ray of his chest and pelvis, as well as his right femur   I independently visualized and interpreted imaging which showed  right femur neck fracture.  There is some displacement. I agree with the radiologist interpretation  EKG (personally reviewed and interpreted): No acute STEMI.  No acute changes from previous.   Medical Decision Making: Patient presented after mechanical fall.  No head trauma or loss of conscious.  His CT scan of his head and neck were both negative.  Plain films of his right hip and femur showed a femoral neck fracture. Patient was in a significant amount of pain. He was given multiple doses of IV fentanyl. I spoke to Dr. Zachery Dakins from Ortho. Recommend admission and they will see him in the morning.  Patient to be admitted to the hospital with the hospitalist service, plan for orthopedics to see him in the morning.  Complexity of problems addressed: Patients presentation is most consistent with  acute presentation with potential threat to life or bodily function  Disposition: After consideration of the diagnostic results and the patients response to treatment,  I feel that the patent would benefit from admission to the hospital .   Patient seen in conjunction with my attending, Dr. Maryan Rued.    Final Clinical Impression(s) / ED Diagnoses Final diagnoses:  None    Rx / DC Orders ED Discharge Orders     None         Jacelyn Pi, MD 03/14/21 2241    Blanchie Dessert, MD 03/14/21 [phone]

## 2021-03-14 NOTE — ED Notes (Signed)
Patient transported to CT w/ TRN ?

## 2021-03-15 ENCOUNTER — Encounter (HOSPITAL_COMMUNITY)
Admission: EM | Disposition: A | Discharge status: Home Health | Payer: Self-pay | Source: Home / Self Care | Attending: Internal Medicine

## 2021-03-15 ENCOUNTER — Inpatient Hospital Stay (HOSPITAL_COMMUNITY): Payer: Medicare HMO | Admitting: Certified Registered Nurse Anesthetist

## 2021-03-15 ENCOUNTER — Inpatient Hospital Stay (HOSPITAL_COMMUNITY): Payer: Medicare HMO

## 2021-03-15 ENCOUNTER — Inpatient Hospital Stay (HOSPITAL_COMMUNITY): Payer: Medicare HMO | Admitting: Anesthesiology

## 2021-03-15 ENCOUNTER — Encounter (HOSPITAL_COMMUNITY): Payer: Self-pay | Admitting: Internal Medicine

## 2021-03-15 ENCOUNTER — Other Ambulatory Visit: Payer: Self-pay

## 2021-03-15 DIAGNOSIS — E782 Mixed hyperlipidemia: Secondary | ICD-10-CM | POA: Diagnosis not present

## 2021-03-15 DIAGNOSIS — I10 Essential (primary) hypertension: Secondary | ICD-10-CM | POA: Diagnosis not present

## 2021-03-15 DIAGNOSIS — I480 Paroxysmal atrial fibrillation: Secondary | ICD-10-CM | POA: Diagnosis not present

## 2021-03-15 DIAGNOSIS — F418 Other specified anxiety disorders: Secondary | ICD-10-CM

## 2021-03-15 DIAGNOSIS — I110 Hypertensive heart disease with heart failure: Secondary | ICD-10-CM

## 2021-03-15 DIAGNOSIS — I509 Heart failure, unspecified: Secondary | ICD-10-CM

## 2021-03-15 DIAGNOSIS — S72001A Fracture of unspecified part of neck of right femur, initial encounter for closed fracture: Secondary | ICD-10-CM

## 2021-03-15 HISTORY — PX: ANTERIOR APPROACH HEMI HIP ARTHROPLASTY: SHX6690

## 2021-03-15 LAB — TYPE AND SCREEN
ABO/RH(D): O POS
Antibody Screen: NEGATIVE

## 2021-03-15 LAB — SURGICAL PCR SCREEN
MRSA, PCR: NEGATIVE
Staphylococcus aureus: NEGATIVE

## 2021-03-15 LAB — PROTIME-INR
INR: 1.3 — ABNORMAL HIGH (ref 0.8–1.2)
Prothrombin Time: 16.2 seconds — ABNORMAL HIGH (ref 11.4–15.2)

## 2021-03-15 LAB — CBG MONITORING, ED: Glucose-Capillary: 140 mg/dL — ABNORMAL HIGH (ref 70–99)

## 2021-03-15 SURGERY — HEMIARTHROPLASTY, HIP, DIRECT ANTERIOR APPROACH, FOR FRACTURE
Anesthesia: General | Site: Hip | Laterality: Right

## 2021-03-15 MED ORDER — PROTHROMBIN COMPLEX CONC HUMAN 500 UNITS IV KIT
2152.0000 [IU] | PACK | Status: AC
  Administered 2021-03-15: 2152 [IU] via INTRAVENOUS
  Filled 2021-03-15: qty 2152

## 2021-03-15 MED ORDER — HYDROMORPHONE HCL 1 MG/ML IJ SOLN
0.5000 mg | INTRAMUSCULAR | Status: DC | PRN
  Administered 2021-03-15 – 2021-03-17 (×5): 0.5 mg via INTRAVENOUS
  Filled 2021-03-15 (×4): qty 0.5
  Filled 2021-03-15: qty 1

## 2021-03-15 MED ORDER — MIDAZOLAM HCL 2 MG/2ML IJ SOLN
INTRAMUSCULAR | Status: AC
  Filled 2021-03-15: qty 2

## 2021-03-15 MED ORDER — KETOROLAC TROMETHAMINE 30 MG/ML IJ SOLN
INTRAMUSCULAR | Status: AC
  Filled 2021-03-15: qty 1

## 2021-03-15 MED ORDER — BUPIVACAINE-EPINEPHRINE 0.5% -1:200000 IJ SOLN
INTRAMUSCULAR | Status: AC
  Filled 2021-03-15: qty 1

## 2021-03-15 MED ORDER — PROPOFOL 10 MG/ML IV BOLUS
INTRAVENOUS | Status: DC | PRN
  Administered 2021-03-15: 80 mg via INTRAVENOUS

## 2021-03-15 MED ORDER — HYDROCODONE-ACETAMINOPHEN 7.5-325 MG PO TABS
1.0000 | ORAL_TABLET | ORAL | Status: DC | PRN
  Administered 2021-03-16: 2 via ORAL
  Administered 2021-03-17: 1 via ORAL
  Administered 2021-03-17 (×2): 2 via ORAL
  Filled 2021-03-15 (×4): qty 2

## 2021-03-15 MED ORDER — LABETALOL HCL 5 MG/ML IV SOLN: 5.0000 mg | Freq: Once | INTRAVENOUS | Status: AC

## 2021-03-15 MED ORDER — CHLORHEXIDINE GLUCONATE 4 % EX LIQD
60.0000 mL | Freq: Once | CUTANEOUS | Status: DC
  Filled 2021-03-15: qty 60

## 2021-03-15 MED ORDER — KETOROLAC TROMETHAMINE 30 MG/ML IJ SOLN
INTRAMUSCULAR | Status: DC | PRN
  Administered 2021-03-15: 30 mg via INTRA_ARTICULAR

## 2021-03-15 MED ORDER — SENNA 8.6 MG PO TABS
1.0000 | ORAL_TABLET | Freq: Two times a day (BID) | ORAL | Status: DC
  Administered 2021-03-15 – 2021-03-22 (×14): 8.6 mg via ORAL
  Filled 2021-03-15 (×14): qty 1

## 2021-03-15 MED ORDER — 0.9 % SODIUM CHLORIDE (POUR BTL) OPTIME
TOPICAL | Status: DC | PRN
  Administered 2021-03-15: 1000 mL

## 2021-03-15 MED ORDER — FENTANYL CITRATE (PF) 100 MCG/2ML IJ SOLN
50.0000 ug | Freq: Once | INTRAMUSCULAR | Status: AC
  Administered 2021-03-15: 50 ug via INTRAVENOUS

## 2021-03-15 MED ORDER — CEFAZOLIN SODIUM-DEXTROSE 2-4 GM/100ML-% IV SOLN
2.0000 g | INTRAVENOUS | Status: AC
  Administered 2021-03-15: 2 g via INTRAVENOUS
  Filled 2021-03-15: qty 100

## 2021-03-15 MED ORDER — CHLORHEXIDINE GLUCONATE 0.12 % MT SOLN
OROMUCOSAL | Status: AC
Start: 2021-03-15 — End: 2021-03-15
  Filled 2021-03-15: qty 15

## 2021-03-15 MED ORDER — PROPOFOL 10 MG/ML IV BOLUS
INTRAVENOUS | Status: AC
  Filled 2021-03-15: qty 20

## 2021-03-15 MED ORDER — SODIUM CHLORIDE (PF) 0.9 % IJ SOLN
INTRAMUSCULAR | Status: DC | PRN
  Administered 2021-03-15: 30 mL

## 2021-03-15 MED ORDER — CLONIDINE HCL 0.1 MG PO TABS
0.1000 mg | ORAL_TABLET | Freq: Two times a day (BID) | ORAL | Status: DC
  Administered 2021-03-15 (×2): 0.1 mg via ORAL
  Filled 2021-03-15 (×2): qty 1

## 2021-03-15 MED ORDER — FENTANYL CITRATE (PF) 100 MCG/2ML IJ SOLN: 25.0000 ug | INTRAMUSCULAR | Status: DC | PRN

## 2021-03-15 MED ORDER — CHLORHEXIDINE GLUCONATE 0.12 % MT SOLN: 15.0000 mL | Freq: Once | OROMUCOSAL | Status: DC

## 2021-03-15 MED ORDER — DOCUSATE SODIUM 100 MG PO CAPS: 100.0000 mg | ORAL_CAPSULE | Freq: Two times a day (BID) | ORAL | Status: DC

## 2021-03-15 MED ORDER — ONDANSETRON HCL 4 MG/2ML IJ SOLN
INTRAMUSCULAR | Status: DC | PRN
  Administered 2021-03-15: 4 mg via INTRAVENOUS

## 2021-03-15 MED ORDER — DEXAMETHASONE SODIUM PHOSPHATE 10 MG/ML IJ SOLN
INTRAMUSCULAR | Status: AC
  Filled 2021-03-15: qty 1

## 2021-03-15 MED ORDER — METHOCARBAMOL 1000 MG/10ML IJ SOLN
500.0000 mg | Freq: Four times a day (QID) | INTRAVENOUS | Status: DC | PRN
  Filled 2021-03-15: qty 5

## 2021-03-15 MED ORDER — ACETAMINOPHEN 10 MG/ML IV SOLN
INTRAVENOUS | Status: AC
  Filled 2021-03-15: qty 100

## 2021-03-15 MED ORDER — DEXAMETHASONE SODIUM PHOSPHATE 10 MG/ML IJ SOLN
INTRAMUSCULAR | Status: DC | PRN
  Administered 2021-03-15: 5 mg

## 2021-03-15 MED ORDER — METHOCARBAMOL 500 MG PO TABS
500.0000 mg | ORAL_TABLET | Freq: Four times a day (QID) | ORAL | Status: DC | PRN
  Administered 2021-03-16 – 2021-03-21 (×7): 500 mg via ORAL
  Filled 2021-03-15 (×8): qty 1

## 2021-03-15 MED ORDER — BUPIVACAINE-EPINEPHRINE (PF) 0.5% -1:200000 IJ SOLN
INTRAMUSCULAR | Status: DC | PRN
  Administered 2021-03-15: 25 mL via PERINEURAL

## 2021-03-15 MED ORDER — MORPHINE SULFATE (PF) 2 MG/ML IV SOLN
0.5000 mg | INTRAVENOUS | Status: DC | PRN
  Administered 2021-03-18: 1 mg via INTRAVENOUS
  Administered 2021-03-19 – 2021-03-20 (×3): 0.5 mg via INTRAVENOUS
  Filled 2021-03-15 (×4): qty 1

## 2021-03-15 MED ORDER — EPHEDRINE 5 MG/ML INJ
INTRAVENOUS | Status: AC
  Filled 2021-03-15: qty 5

## 2021-03-15 MED ORDER — ONDANSETRON HCL 4 MG/2ML IJ SOLN: 4.0000 mg | Freq: Four times a day (QID) | INTRAMUSCULAR | Status: DC | PRN

## 2021-03-15 MED ORDER — TRANEXAMIC ACID-NACL 1000-0.7 MG/100ML-% IV SOLN
1000.0000 mg | INTRAVENOUS | Status: AC
  Administered 2021-03-15: 1000 mg via INTRAVENOUS
  Filled 2021-03-15: qty 100

## 2021-03-15 MED ORDER — ACETAMINOPHEN 10 MG/ML IV SOLN
1000.0000 mg | Freq: Once | INTRAVENOUS | Status: AC
  Administered 2021-03-15: 1000 mg via INTRAVENOUS
  Filled 2021-03-15: qty 100

## 2021-03-15 MED ORDER — HYDROCODONE-ACETAMINOPHEN 5-325 MG PO TABS: 1.0000 | ORAL_TABLET | ORAL | Status: DC | PRN

## 2021-03-15 MED ORDER — PANTOPRAZOLE SODIUM 40 MG PO TBEC
40.0000 mg | DELAYED_RELEASE_TABLET | Freq: Every day | ORAL | Status: DC
  Administered 2021-03-15 – 2021-03-22 (×8): 40 mg via ORAL
  Filled 2021-03-15 (×8): qty 1

## 2021-03-15 MED ORDER — METOCLOPRAMIDE HCL 5 MG/ML IJ SOLN: 5.0000 mg | Freq: Three times a day (TID) | INTRAMUSCULAR | Status: DC | PRN

## 2021-03-15 MED ORDER — FENTANYL CITRATE (PF) 250 MCG/5ML IJ SOLN
INTRAMUSCULAR | Status: AC
  Filled 2021-03-15: qty 5

## 2021-03-15 MED ORDER — SENNOSIDES-DOCUSATE SODIUM 8.6-50 MG PO TABS: 2.0000 | ORAL_TABLET | Freq: Every day | ORAL | Status: DC

## 2021-03-15 MED ORDER — LACTATED RINGERS IV SOLN: INTRAVENOUS | Status: DC

## 2021-03-15 MED ORDER — ACETAMINOPHEN 325 MG PO TABS
325.0000 mg | ORAL_TABLET | Freq: Four times a day (QID) | ORAL | Status: DC | PRN
  Administered 2021-03-18 – 2021-03-20 (×2): 650 mg via ORAL
  Filled 2021-03-15 (×2): qty 2

## 2021-03-15 MED ORDER — PHENYLEPHRINE HCL-NACL 20-0.9 MG/250ML-% IV SOLN
INTRAVENOUS | Status: DC | PRN
  Administered 2021-03-15: 25 ug/min via INTRAVENOUS

## 2021-03-15 MED ORDER — ORAL CARE MOUTH RINSE: 15.0000 mL | Freq: Once | OROMUCOSAL | Status: DC

## 2021-03-15 MED ORDER — METOCLOPRAMIDE HCL 5 MG PO TABS: 5.0000 mg | ORAL_TABLET | Freq: Three times a day (TID) | ORAL | Status: DC | PRN

## 2021-03-15 MED ORDER — ONDANSETRON HCL 4 MG PO TABS
4.0000 mg | ORAL_TABLET | Freq: Four times a day (QID) | ORAL | Status: DC | PRN
  Administered 2021-03-17: 4 mg via ORAL
  Filled 2021-03-15: qty 1

## 2021-03-15 MED ORDER — MENTHOL 3 MG MT LOZG: 1.0000 | LOZENGE | OROMUCOSAL | Status: DC | PRN

## 2021-03-15 MED ORDER — ROCURONIUM BROMIDE 10 MG/ML (PF) SYRINGE
PREFILLED_SYRINGE | INTRAVENOUS | Status: DC | PRN
  Administered 2021-03-15: 60 mg via INTRAVENOUS

## 2021-03-15 MED ORDER — SODIUM CHLORIDE 0.9 % IR SOLN
Status: DC | PRN
  Administered 2021-03-15 (×2): 1000 mL

## 2021-03-15 MED ORDER — POLYETHYLENE GLYCOL 3350 17 G PO PACK
17.0000 g | PACK | Freq: Every day | ORAL | Status: DC | PRN
  Administered 2021-03-18: 17 g via ORAL
  Filled 2021-03-15: qty 1

## 2021-03-15 MED ORDER — FENTANYL CITRATE (PF) 100 MCG/2ML IJ SOLN
INTRAMUSCULAR | Status: AC
  Filled 2021-03-15: qty 2

## 2021-03-15 MED ORDER — APIXABAN 2.5 MG PO TABS
2.5000 mg | ORAL_TABLET | Freq: Two times a day (BID) | ORAL | Status: DC
  Administered 2021-03-16 – 2021-03-17 (×3): 2.5 mg via ORAL
  Filled 2021-03-15 (×3): qty 1

## 2021-03-15 MED ORDER — FENTANYL CITRATE (PF) 250 MCG/5ML IJ SOLN
INTRAMUSCULAR | Status: DC | PRN
  Administered 2021-03-15 (×2): 25 ug via INTRAVENOUS
  Administered 2021-03-15: 50 ug via INTRAVENOUS

## 2021-03-15 MED ORDER — PHENOL 1.4 % MT LIQD: 1.0000 | OROMUCOSAL | Status: DC | PRN

## 2021-03-15 MED ORDER — POVIDONE-IODINE 10 % EX SWAB: 2.0000 "application " | Freq: Once | CUTANEOUS | Status: DC

## 2021-03-15 MED ORDER — SUGAMMADEX SODIUM 200 MG/2ML IV SOLN
INTRAVENOUS | Status: DC | PRN
  Administered 2021-03-15: 200 mg via INTRAVENOUS

## 2021-03-15 MED ORDER — LABETALOL HCL 5 MG/ML IV SOLN
5.0000 mg | INTRAVENOUS | Status: DC | PRN
  Administered 2021-03-15 – 2021-03-20 (×2): 5 mg via INTRAVENOUS
  Filled 2021-03-15 (×2): qty 4

## 2021-03-15 MED ORDER — PHENYLEPHRINE 40 MCG/ML (10ML) SYRINGE FOR IV PUSH (FOR BLOOD PRESSURE SUPPORT)
PREFILLED_SYRINGE | INTRAVENOUS | Status: AC
  Filled 2021-03-15: qty 20

## 2021-03-15 MED ORDER — LIDOCAINE 2% (20 MG/ML) 5 ML SYRINGE
INTRAMUSCULAR | Status: DC | PRN
  Administered 2021-03-15: 60 mg via INTRAVENOUS

## 2021-03-15 MED ORDER — SENNOSIDES-DOCUSATE SODIUM 8.6-50 MG PO TABS
1.0000 | ORAL_TABLET | Freq: Every day | ORAL | Status: DC
  Administered 2021-03-15: 1 via ORAL
  Filled 2021-03-15: qty 1

## 2021-03-15 MED ORDER — ONDANSETRON HCL 4 MG/2ML IJ SOLN
INTRAMUSCULAR | Status: AC
  Filled 2021-03-15: qty 2

## 2021-03-15 MED ORDER — LABETALOL HCL 5 MG/ML IV SOLN
INTRAVENOUS | Status: AC
  Administered 2021-03-15: 5 mg via INTRAVENOUS
  Filled 2021-03-15: qty 4

## 2021-03-15 MED ORDER — EPHEDRINE SULFATE-NACL 50-0.9 MG/10ML-% IV SOSY
PREFILLED_SYRINGE | INTRAVENOUS | Status: DC | PRN
  Administered 2021-03-15: 5 mg via INTRAVENOUS

## 2021-03-15 MED ORDER — CEFAZOLIN SODIUM-DEXTROSE 2-4 GM/100ML-% IV SOLN
2.0000 g | Freq: Four times a day (QID) | INTRAVENOUS | Status: AC
  Administered 2021-03-15 (×2): 2 g via INTRAVENOUS
  Filled 2021-03-15 (×2): qty 100

## 2021-03-15 MED ORDER — ACETAMINOPHEN 500 MG PO TABS: 1000.0000 mg | ORAL_TABLET | Freq: Once | ORAL | Status: DC

## 2021-03-15 MED ORDER — ROPIVACAINE HCL 5 MG/ML IJ SOLN
INTRAMUSCULAR | Status: DC | PRN
  Administered 2021-03-15: 25 mL

## 2021-03-15 MED ORDER — MINOXIDIL 10 MG PO TABS
10.0000 mg | ORAL_TABLET | Freq: Two times a day (BID) | ORAL | Status: DC
  Administered 2021-03-15 – 2021-03-22 (×16): 10 mg via ORAL
  Filled 2021-03-15 (×17): qty 1

## 2021-03-15 MED ORDER — LABETALOL HCL 5 MG/ML IV SOLN
5.0000 mg | Freq: Once | INTRAVENOUS | Status: AC
  Administered 2021-03-15: 5 mg via INTRAVENOUS

## 2021-03-15 SURGICAL SUPPLY — 72 items
ADH SKN CLS APL DERMABOND .7 (GAUZE/BANDAGES/DRESSINGS) ×1
ALCOHOL 70% 16 OZ (MISCELLANEOUS) ×3 IMPLANT
APL PRP STRL LF DISP 70% ISPRP (MISCELLANEOUS) ×1
BAG COUNTER SPONGE SURGICOUNT (BAG) ×3 IMPLANT
BAG SPNG CNTER NS LX DISP (BAG) ×1
BLADE CLIPPER SURG (BLADE) ×1 IMPLANT
CHLORAPREP W/TINT 26 (MISCELLANEOUS) ×3 IMPLANT
COVER SURGICAL LIGHT HANDLE (MISCELLANEOUS) ×3 IMPLANT
DERMABOND ADVANCED (GAUZE/BANDAGES/DRESSINGS) ×1
DERMABOND ADVANCED .7 DNX12 (GAUZE/BANDAGES/DRESSINGS) ×4 IMPLANT
DRAPE C-ARM 42X72 X-RAY (DRAPES) ×3 IMPLANT
DRAPE STERI IOBAN 125X83 (DRAPES) ×3 IMPLANT
DRAPE U-SHAPE 47X51 STRL (DRAPES) ×9 IMPLANT
DRSG AQUACEL AG ADV 3.5X 6 (GAUZE/BANDAGES/DRESSINGS) ×1 IMPLANT
DRSG AQUACEL AG ADV 3.5X10 (GAUZE/BANDAGES/DRESSINGS) ×3 IMPLANT
DRSG TEGADERM 4X4.75 (GAUZE/BANDAGES/DRESSINGS) ×1 IMPLANT
ELECT BLADE 4.0 EZ CLEAN MEGAD (MISCELLANEOUS) ×2
ELECT PENCIL ROCKER SW 15FT (MISCELLANEOUS) ×3 IMPLANT
ELECT REM PT RETURN 9FT ADLT (ELECTROSURGICAL) ×2
ELECTRODE BLDE 4.0 EZ CLN MEGD (MISCELLANEOUS) ×2 IMPLANT
ELECTRODE REM PT RTRN 9FT ADLT (ELECTROSURGICAL) ×2 IMPLANT
EVACUATOR 1/8 PVC DRAIN (DRAIN) ×1 IMPLANT
GAUZE SPONGE 2X2 8PLY STRL LF (GAUZE/BANDAGES/DRESSINGS) IMPLANT
GLOVE SURG ENC MOIS LTX SZ8.5 (GLOVE) ×6 IMPLANT
GLOVE SURG ENC TEXT LTX SZ7 (GLOVE) ×3 IMPLANT
GLOVE SURG UNDER POLY LF SZ7.5 (GLOVE) ×3 IMPLANT
GLOVE SURG UNDER POLY LF SZ8.5 (GLOVE) ×3 IMPLANT
GOWN STRL REUS W/ TWL LRG LVL3 (GOWN DISPOSABLE) ×4 IMPLANT
GOWN STRL REUS W/ TWL XL LVL3 (GOWN DISPOSABLE) ×2 IMPLANT
GOWN STRL REUS W/TWL 2XL LVL3 (GOWN DISPOSABLE) ×3 IMPLANT
GOWN STRL REUS W/TWL LRG LVL3 (GOWN DISPOSABLE) ×4
GOWN STRL REUS W/TWL XL LVL3 (GOWN DISPOSABLE) ×2
HANDPIECE INTERPULSE COAX TIP (DISPOSABLE) ×2
HEAD MODULAR STD 28MM (Orthopedic Implant) ×1 IMPLANT
HOOD PEEL AWAY FACE SHEILD DIS (HOOD) ×7 IMPLANT
JET LAVAGE IRRISEPT WOUND (IRRIGATION / IRRIGATOR) ×2
KIT BASIN OR (CUSTOM PROCEDURE TRAY) ×3 IMPLANT
KIT TURNOVER KIT B (KITS) ×3 IMPLANT
LAVAGE JET IRRISEPT WOUND (IRRIGATION / IRRIGATOR) ×2 IMPLANT
MANIFOLD NEPTUNE II (INSTRUMENTS) ×3 IMPLANT
MARKER SKIN DUAL TIP RULER LAB (MISCELLANEOUS) ×6 IMPLANT
NDL SPNL 18GX3.5 QUINCKE PK (NEEDLE) ×2 IMPLANT
NEEDLE SPNL 18GX3.5 QUINCKE PK (NEEDLE) ×2 IMPLANT
NS IRRIG 1000ML POUR BTL (IV SOLUTION) ×3 IMPLANT
PACK TOTAL JOINT (CUSTOM PROCEDURE TRAY) ×3 IMPLANT
PACK UNIVERSAL I (CUSTOM PROCEDURE TRAY) ×3 IMPLANT
PAD ARMBOARD 7.5X6 YLW CONV (MISCELLANEOUS) ×6 IMPLANT
RINGBLOC BI POLAR 28X52MM (Orthopedic Implant) ×2 IMPLANT
SAW OSC TIP CART 19.5X105X1.3 (SAW) ×3 IMPLANT
SEALER BIPOLAR AQUA 6.0 (INSTRUMENTS) ×1 IMPLANT
SET HNDPC FAN SPRY TIP SCT (DISPOSABLE) ×2 IMPLANT
SHELL RINGBLOC BI POLR 28X52MM (Orthopedic Implant) IMPLANT
SOL PREP POV-IOD 4OZ 10% (MISCELLANEOUS) ×2 IMPLANT
SPONGE GAUZE 2X2 STER 10/PKG (GAUZE/BANDAGES/DRESSINGS) ×1
STAPLER VISISTAT 35W (STAPLE) ×1 IMPLANT
STEM FEM CMTLS 17X154 133D (Stem) ×1 IMPLANT
SUT ETHIBOND NAB CT1 #1 30IN (SUTURE) ×6 IMPLANT
SUT MNCRL AB 3-0 PS2 18 (SUTURE) ×3 IMPLANT
SUT MON AB 2-0 CT1 36 (SUTURE) ×4 IMPLANT
SUT VIC AB 1 CT1 27 (SUTURE) ×2
SUT VIC AB 1 CT1 27XBRD ANBCTR (SUTURE) ×2 IMPLANT
SUT VIC AB 2-0 CT1 27 (SUTURE) ×2
SUT VIC AB 2-0 CT1 TAPERPNT 27 (SUTURE) ×2 IMPLANT
SUT VLOC 180 0 24IN GS25 (SUTURE) ×3 IMPLANT
SYR 50ML LL SCALE MARK (SYRINGE) ×4 IMPLANT
SYR BULB IRRIG 60ML STRL (SYRINGE) ×1 IMPLANT
TOWEL GREEN STERILE (TOWEL DISPOSABLE) ×3 IMPLANT
TOWEL GREEN STERILE FF (TOWEL DISPOSABLE) ×3 IMPLANT
TRAY CATH 16FR W/PLASTIC CATH (SET/KITS/TRAYS/PACK) IMPLANT
TRAY FOLEY W/BAG SLVR 16FR (SET/KITS/TRAYS/PACK)
TRAY FOLEY W/BAG SLVR 16FR ST (SET/KITS/TRAYS/PACK) IMPLANT
WATER STERILE IRR 1000ML POUR (IV SOLUTION) ×9 IMPLANT

## 2021-03-15 NOTE — Discharge Instructions (Signed)
? ?Dr. Rakesh Dutko ?Joint Replacement Specialist ?[facility] ?[address] ?[place], [place] [address] ?[phone] ? ? ?TOTAL HIP REPLACEMENT POSTOPERATIVE DIRECTIONS ? ? ? ?Hip Rehabilitation, Guidelines Following Surgery  ? ?WEIGHT BEARING ?Weight bearing as tolerated with assist device (walker, cane, etc) as directed, use it as long as suggested by your surgeon or therapist, typically at least 4-6 weeks. ? ?The results of a hip operation are greatly improved after range of motion and muscle strengthening exercises. Follow all safety measures which are given to protect your hip. If any of these exercises cause increased pain or swelling in your joint, decrease the amount until you are comfortable again. Then slowly increase the exercises. Call your caregiver if you have problems or questions.  ? ?HOME CARE INSTRUCTIONS  ?Most of the following instructions are designed to prevent the dislocation of your new hip.  ?Remove items at home which could result in a fall. This includes throw rugs or furniture in walking pathways.  ?Continue medications as instructed at time of discharge. ?You may have some home medications which will be placed on hold until you complete the course of blood thinner medication. ?You may start showering once you are discharged home. Do not remove your dressing. ?Do not put on socks or shoes without following the instructions of your caregivers.   ?Sit on chairs with arms. Use the chair arms to help push yourself up when arising.  ?Arrange for the use of a toilet seat elevator so you are not sitting low.  ?Walk with walker as instructed.  ?You may resume a sexual relationship in one month or when given the OK by your caregiver.  ?Use walker as long as suggested by your caregivers.  ?You may put full weight on your legs and walk as much as is comfortable. ?Avoid periods of inactivity such as sitting longer than an hour when not asleep. This helps prevent blood  clots.  ?You may return to work once you are cleared by your surgeon.  ?Do not drive a car for 6 weeks or until released by your surgeon.  ?Do not drive while taking narcotics.  ?Wear elastic stockings for two weeks following surgery during the day but you may remove then at night.  ?Make sure you keep all of your appointments after your operation with all of your doctors and caregivers. You should call the office at the above phone number and make an appointment for approximately two weeks after the date of your surgery. ?Please pick up a stool softener and laxative for home use as long as you are requiring pain medications. ?ICE to the affected hip every three hours for 30 minutes at a time and then as needed for pain and swelling. Continue to use ice on the hip for pain and swelling from surgery. You may notice swelling that will progress down to the foot and ankle.  This is normal after surgery.  Elevate the leg when you are not up walking on it.   ?It is important for you to complete the blood thinner medication as prescribed by your doctor. ?Continue to use the breathing machine which will help keep your temperature down.  It is common for your temperature to cycle up and down following surgery, especially at night when you are not up moving around and exerting yourself.  The breathing machine keeps your lungs expanded and your temperature down. ? ?RANGE OF MOTION AND STRENGTHENING EXERCISES  ?These exercises are designed to help you   keep full movement of your hip joint. Follow your caregiver's or physical therapist's instructions. Perform all exercises about fifteen times, three times per day or as directed. Exercise both hips, even if you have had only one joint replacement. These exercises can be done on a training (exercise) mat, on the floor, on a table or on a bed. Use whatever works the best and is most comfortable for you. Use music or television while you are exercising so that the exercises are a  pleasant break in your day. This will make your life better with the exercises acting as a break in routine you can look forward to.  ?Lying on your back, slowly slide your foot toward your buttocks, raising your knee up off the floor. Then slowly slide your foot back down until your leg is straight again.  ?Lying on your back spread your legs as far apart as you can without causing discomfort.  ?Lying on your side, raise your upper leg and foot straight up from the floor as far as is comfortable. Slowly lower the leg and repeat.  ?Lying on your back, tighten up the muscle in the front of your thigh (quadriceps muscles). You can do this by keeping your leg straight and trying to raise your heel off the floor. This helps strengthen the largest muscle supporting your knee.  ?Lying on your back, tighten up the muscles of your buttocks both with the legs straight and with the knee bent at a comfortable angle while keeping your heel on the floor.  ? ?SKILLED REHAB INSTRUCTIONS: ?If the patient is transferred to a skilled rehab facility following release from the hospital, a list of the current medications will be sent to the facility for the patient to continue.  When discharged from the skilled rehab facility, please have the facility set up the patient's Home Health Physical Therapy prior to being released. Also, the skilled facility will be responsible for providing the patient with their medications at time of release from the facility to include their pain medication and their blood thinner medication. If the patient is still at the rehab facility at time of the two week follow up appointment, the skilled rehab facility will also need to assist the patient in arranging follow up appointment in our office and any transportation needs. ? ?POST-OPERATIVE OPIOID TAPER INSTRUCTIONS: ?It is important to wean off of your opioid medication as soon as possible. If you do not need pain medication after your surgery it is ok  to stop day one. ?Opioids include: ?Codeine, Hydrocodone(Norco, Vicodin), Oxycodone(Percocet, oxycontin) and hydromorphone amongst others.  ?Long term and even short term use of opiods can cause: ?Increased pain response ?Dependence ?Constipation ?Depression ?Respiratory depression ?And more.  ?Withdrawal symptoms can include ?Flu like symptoms ?Nausea, vomiting ?And more ?Techniques to manage these symptoms ?Hydrate well ?Eat regular healthy meals ?Stay active ?Use relaxation techniques(deep breathing, meditating, yoga) ?Do Not substitute Alcohol to help with tapering ?If you have been on opioids for less than two weeks and do not have pain than it is ok to stop all together.  ?Plan to wean off of opioids ?This plan should start within one week post op of your joint replacement. ?Maintain the same interval or time between taking each dose and first decrease the dose.  ?Cut the total daily intake of opioids by one tablet each day ?Next start to increase the time between doses. ?The last dose that should be eliminated is the evening dose.  ? ? ?MAKE   SURE YOU:  ?Understand these instructions.  ?Will watch your condition.  ?Will get help right away if you are not doing well or get worse. ? ?Pick up stool softner and laxative for home use following surgery while on pain medications. ?Do not remove your dressing. ?The dressing is waterproof--it is OK to take showers. ?Continue to use ice for pain and swelling after surgery. ?Do not use any lotions or creams on the incision until instructed by your surgeon. ?Total Hip Protocol. ? ?

## 2021-03-15 NOTE — Anesthesia Preprocedure Evaluation (Addendum)
Anesthesia Evaluation  ?Patient identified by MRN, date of birth, ID band ?Patient awake ? ? ? ?Reviewed: ?Allergy & Precautions, NPO status , Patient's Chart, lab work & pertinent test results ? ?Airway ?Mallampati: II ? ?TM Distance: >3 FB ?Neck ROM: Full ? ? ? Dental ? ?(+) Poor Dentition ?  ?Pulmonary ?asthma , former smoker,  ?  ?Pulmonary exam normal ? ? ? ? ? ? ? Cardiovascular ?hypertension, +CHF  ?+ dysrhythmias (on Eliquis) Atrial Fibrillation  ?Rhythm:Irregular Rate:Normal ? ? ?  ?Neuro/Psych ? Headaches, Anxiety Depression CVA, Residual Symptoms   ? GI/Hepatic ?Neg liver ROS, GERD  ,  ?Endo/Other  ? ? Renal/GU ?negative Renal ROS  ? ?  ?Musculoskeletal ? ?(+) Arthritis , Osteoarthritis,  Right hip fracture  ? Abdominal ?Normal abdominal exam  (+)   ?Peds ? Hematology ? ?(+) Blood dyscrasia, anemia ,   ?Anesthesia Other Findings ? ? Reproductive/Obstetrics ? ?  ? ? ? ? ? ? ? ? ? ? ? ? ? ?  ?  ? ? ? ? ? ? ?                                  Anesthesia Evaluation  ?Patient identified by MRN, date of birth, ID band ?Patient awake ? ? ? ?Reviewed: ?Allergy & Precautions, NPO status , Patient's Chart, lab work & pertinent test results ? ?Airway ?Mallampati: II ? ?TM Distance: >3 FB ?Neck ROM: Full ? ? ? Dental ?no notable dental hx. ? ?  ?Pulmonary ?asthma , former smoker,  ?  ?Pulmonary exam normal ? ? ? ? ? ? ? Cardiovascular ?hypertension, Pt. on medications ?+CHF  ?+ dysrhythmias (on Eliquis) Atrial Fibrillation  ?Rhythm:Regular Rate:Normal ? ? ?  ?Neuro/Psych ? Headaches, Anxiety Depression CVA   ? GI/Hepatic ?Neg liver ROS, GERD  Medicated,  ?Endo/Other  ?diabetes ? Renal/GU ?negative Renal ROS  ?negative genitourinary ?  ?Musculoskeletal ? ?(+) Arthritis , Osteoarthritis,  Hip fracture  ? Abdominal ?Normal abdominal exam  (+)   ?Peds ? Hematology ? ?(+) Blood dyscrasia, anemia ,   ?Anesthesia Other Findings ? ? Reproductive/Obstetrics ? ?  ? ? ? ? ? ? ? ? ? ? ? ? ? ?  ?   ? ? ? ? ? ? ? ?Anesthesia Physical ?Anesthesia Plan ? ?ASA: 3 ? ?Anesthesia Plan: Regional  ? ?Post-op Pain Management: Regional block*  ? ?Induction:  ? ?PONV Risk Score and Plan: 1 and Treatment may vary due to age or medical condition ? ?Airway Management Planned: Simple Face Mask, Natural Airway and Nasal Cannula ? ?Additional Equipment: None ? ?Intra-op Plan:  ? ?Post-operative Plan:  ? ?Informed Consent:  ? ?Plan Discussed with:  ? ?Anesthesia Plan Comments: (ECHO 2022: ??1. Left ventricular ejection fraction, by estimation, is 65 to 70%. The  ?left ventricle has normal function. The left ventricle has no regional  ?wall motion abnormalities. Left ventricular diastolic parameters are  ?consistent with Grade I diastolic  ?dysfunction (impaired relaxation).  ??2. Right ventricular systolic function is normal. The right ventricular  ?size is normal. There is mildly elevated pulmonary artery systolic  ?pressure. The estimated right ventricular systolic pressure is 40.1 mmHg.  ??3. Right atrial size was mildly dilated.  ??4. The mitral valve is grossly normal. No evidence of mitral valve  ?regurgitation. No evidence of mitral stenosis.  ??5. The aortic valve is tricuspid. Aortic valve regurgitation is not  ?visualized.  No aortic stenosis is present.  ??6. The inferior vena cava is dilated in size with >50% respiratory  ?variability, suggesting right atrial pressure of 8 mmHg.  ? ?Comparison(s): Changes from prior study are noted. EF unchanged. G1DD.  ?Elevated RVSP~43 mmHG. )  ? ? ? ? ? ? ?Anesthesia Quick Evaluation ? ?Anesthesia Physical ?Anesthesia Plan ? ?ASA: 3 ? ?Anesthesia Plan: General  ? ?Post-op Pain Management:   ? ?Induction: Intravenous ? ?PONV Risk Score and Plan: 2 ? ?Airway Management Planned: Mask and Oral ETT ? ?Additional Equipment: None ? ?Intra-op Plan:  ? ?Post-operative Plan: Extubation in OR ? ?Informed Consent: I have reviewed the patients History and Physical, chart, labs and discussed  the procedure including the risks, benefits and alternatives for the proposed anesthesia with the patient or authorized representative who has indicated his/her understanding and acceptance.  ? ? ? ?Consent reviewed with POA ? ?Plan Discussed with: CRNA ? ?Anesthesia Plan Comments: (Lab Results ?     Component                Value               Date                 ?     WBC                      6.8                 03/14/2021           ?     HGB                      12.0 (L)            03/14/2021           ?     HCT                      36.2 (L)            03/14/2021           ?     MCV                      90.0                03/14/2021           ?     PLT                      258                 03/14/2021          )  ? ? ? ? ? ? ?Anesthesia Quick Evaluation ? ?

## 2021-03-15 NOTE — Anesthesia Procedure Notes (Signed)
Procedure Name: Intubation ?Date/Time: 03/15/2021 12:18 PM ?Performed by: Kyung Rudd, CRNA ?Pre-anesthesia Checklist: Patient identified, Emergency Drugs available, Suction available and Patient being monitored ?Patient Re-evaluated:Patient Re-evaluated prior to induction ?Oxygen Delivery Method: Circle system utilized ?Preoxygenation: Pre-oxygenation with 100% oxygen ?Induction Type: IV induction ?Ventilation: Mask ventilation without difficulty ?Laryngoscope Size: Mac and 3 ?Grade View: Grade I ?Tube type: Oral ?Tube size: 7.5 mm ?Number of attempts: 1 ?Airway Equipment and Method: Stylet ?Placement Confirmation: ETT inserted through vocal cords under direct vision, positive ETCO2 and breath sounds checked- equal and bilateral ?Secured at: 21 cm ?Tube secured with: Tape ?Dental Injury: Teeth and Oropharynx as per pre-operative assessment  ? ? ? ? ?

## 2021-03-15 NOTE — Progress Notes (Signed)
Orthopedic Tech Progress Note ?Patient Details:  ?[name] ?[birth year] ?[id number] ? ?Patient ID: [name], male   DOB: 16-Jun-1929, 88 y.o.   MRN: [id number] ?I attended trauma page ?Karolee Stamps ?03/15/2021, 12:15 AM ? ?

## 2021-03-15 NOTE — ED Notes (Signed)
Ortho provider at  bedside ?

## 2021-03-15 NOTE — Op Note (Signed)
OPERATIVE REPORT ? ?SURGEON: Rod Can, MD  ? ?ASSISTANT: Jonelle Sidle, PA-C ? ?PREOPERATIVE DIAGNOSIS: Displaced Right femoral neck fracture.  ? ?POSTOPERATIVE DIAGNOSIS: Displaced Right femoral neck fracture.  ? ?PROCEDURE: Right hip hemiarthroplasty, anterior approach.  ? ?IMPLANTS: Biomet Taperloc Complete Reduced Distal stem, size 17 x 128mm, standard offset, with a 28 +0 mm metal head ball and a 52 x 28 mm bipolar head ball. ? ?ANESTHESIA:  General and Regional ? ?ANTIBIOTICS: 2g ancef. ? ?ESTIMATED BLOOD LOSS:-150 mL   ? ?DRAINS: 10 mm flat JP in the subcutaneous tissue. ? ?COMPLICATIONS: None ?  ?CONDITION: PACU - hemodynamically stable.  ? ?BRIEF CLINICAL NOTE: [name] is a 88 y.o. male with a displaced Right femoral neck fracture. The patient was admitted to the hospitalist service and underwent perioperative risk stratification and medical optimization. The risks, benefits, and alternatives to hemiarthroplasty were explained, and the patient elected to proceed. ? ?PROCEDURE IN DETAIL: The patient received a regional block in holding. He was taken to the operating room and general anesthesia was induced on the hospital bed.  The patient was then positioned on the Hana table.  All bony prominences were well padded.  The hip was prepped and draped in the normal sterile surgical fashion.  A time-out was called verifying side and site of surgery. Antibiotics were given within 60 minutes of beginning the procedure. ?  ?Bikini incision was made, and the direct anterior approach to the hip was performed through the Hueter interval.  Superficial dissection was carried out lateral to the ASIS. Lateral femoral circumflex vessels were treated with the Auqumantys. The anterior capsule was exposed and an inverted T capsulotomy was made. Fracture hematoma was encountered and evacuated. The patient was found to have a comminuted Right subcapital femoral neck fracture.  Inferior pubofemoral ligament was  released subperiosteally to the lesser trochanter. I freshened the femoral neck cut with a saw.  I removed the femoral neck fragment.  A corkscrew was placed into the head and the head was removed.  This was passed to the back table and was measured. ?  ?Acetabular exposure was achieved.  I examined the articular cartilage which was intact.  The labrum was intact. A 52 mm trial head was placed and found to have excellent fit. ?  ?I then gained femoral exposure taking care to protect the abductors and greater trochanter.  The superior capsule was incised longitudinally, staying lateral to the posterior border of the femoral neck. External rotation, extension, and adduction were applied.  A cookie cutter was used to enter the femoral canal, and then the femoral canal finder was used to confirm location.  I then sequentially broached up to a size 17.  Calcar planer was used on the femoral neck remnant.  I placed a standard offset neck and a bipolar trial head ball. The hip was reduced.  Leg lengths were checked fluoroscopically.  The hip was dislocated and trial components were removed.  I placed the real stem followed by the real bipolar bearing.  A single reduction maneuver was performed and the hip was reduced.  Fluoroscopy was used to confirm component position and leg lengths.  At 90 degrees of external rotation and extension, the hip was stable to an anterior directed force. ?  ?The wound was copiously irrigated with Irrisept solution and normal saline using pulse lavage.  Marcaine solution was injected into the periarticular soft tissue.  A 10 mm flat JP drain was inserted into the subcutaneous tissue through  a stab incision over the lateral thigh. The wound was closed in layers using #1 Stratafix for the fascia, 2-0 Vicryl for the subcutaneous fat, 2-0 Monocryl for the deep dermal layer, and staples + Dermabond for the skin.  Once the glue was fully dried, an Aquacell Ag dressing was applied.  The JP drain was  sewn in with a 3-0 prolene suture. The patient was then awakened from anesthesia and transported to the recovery room in stable condition.  Sponge, needle, and instrument counts were correct at the end of the case x2.  The patient tolerated the procedure well and there were no known complications. ? ?Please note that a surgical assistant was a medical necessity for this procedure to perform it in a safe and expeditious manner. Assistant was necessary to provide appropriate retraction of vital neurovascular structures, to prevent femoral fracture, and to allow for anatomic placement of the prosthesis.  ? ?POSTOPERATIVE PLAN: The patient will be readmitted to the hospitalist. Weight bearing as tolerated right lower extremity. Resume Eliquis tomorrow morning. Mobilize out of bed with PT/OT. Plan to discharge with the JP drain.  ?

## 2021-03-15 NOTE — ED Notes (Signed)
X-ray at bedside

## 2021-03-15 NOTE — H&P (View-Only) (Signed)
Reason for Consult:Right hip fracture Referring Physician: [facility]  [name] is an 88 y.o. male.  HPI: 88 yo male with multiple medical problems who is on Eliquis presents after fall at a restaurant yesterday. Patient reported immediate right hip pain and was unable to stand after the fall. 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 He denies any other injuries. Patient and family are requesting [facility] for eval and treatment.  Past Medical History:  Diagnosis Date   Acute bronchitis 06/10/2015   Allergic rhinitis    Anemia    Asthma    Carotid stenosis    a. s/p Left CEA 2002;  b. Carotid US 3/16:  patent R CEA, L < 40%   Chronic pain syndrome    Left shoulder, back   Diverticulosis    Dyslipidemia    Epigastric pain 04/05/2016   Esophageal stricture    Distal, benign   GERD (gastroesophageal reflux disease)    Hemorrhoids    HTN (hypertension)    Negative renal duplex 07-22-11   Hx of echocardiogram    a. Echo 10/11: mod LVH, EF 55-60%   Pleural effusion 07/17/2020   Prostate cancer ([place]) 2000   Seed XRT   Spondylosis, cervical, with myelopathy 11/26/2015   Stroke ([place]) 8/01   right thalamic - on chronic Plavix/ASA    Past Surgical History:  Procedure Laterality Date   APPENDECTOMY  1953   CAROTID ENDARTERECTOMY Right 01/2000   CATARACT EXTRACTION Bilateral    CERVICAL [place] SURGERY  8/07   CHEST TUBE INSERTION Left 11/11/2020   Procedure: INSERTION PLEURAL DRAINAGE CATHETER;  Surgeon: [name], DO;  Location: [facility];  Service: Pulmonary;  Laterality: Left;  Indwelling Pleural Catheter   HIP ARTHROPLASTY Left 10/24/2018   Procedure: ARTHROPLASTY BIPOLAR HIP (HEMIARTHROPLASTY);  Surgeon: Paralee Cancel, MD;  Location: [facility];  Service: Orthopedics;  Laterality: Left;   INSERTION PROSTATE RADIATION SEED  2000   LUMBAR DISC SURGERY  1990s   NASAL SINUS SURGERY     x 3   REMOVAL OF PLEURAL DRAINAGE CATHETER N/A 12/24/2020    Procedure: MINOR REMOVAL OF PLEURAL DRAINAGE CATHETER;  Surgeon: [name], DO;  Location: [facility];  Service: Cardiopulmonary;  Laterality: N/A;   THORACENTESIS N/A 08/15/2020   Procedure: THORACENTESIS;  Surgeon: [name], DO;  Location: [facility];  Service: Pulmonary;  Laterality: N/A;    Family History  Problem Relation Age of Onset   Stroke Brother    Stroke Sister    Stroke Mother    Hyperlipidemia Mother    Hypertension Mother    Lung disease Father    Diabetes Son    Gout Son    Obesity Sister    Alcohol abuse Brother    Cancer Brother        lung, smoker    Social History:  reports that he quit smoking about 43 years ago. His smoking use included cigarettes. He started smoking about 73 years ago. He has a 8.75 pack-year smoking history. His smokeless tobacco use includes chew. He reports that he does not drink alcohol and does not use drugs.  Allergies:  Allergies  Allergen Reactions   Hctz [Hydrochlorothiazide] Shortness Of Breath, Swelling and Other (See Comments)    "Swelling and dyspnea"    Hydralazine Shortness Of Breath and Other (See Comments)    Chest pain and GI issues also   Diovan [Valsartan] Other (See Comments)    Elevated  potassium- Hyperkalemia   Metformin And Related Other (See Comments)    Reaction not recalled   Nsaids Other (See Comments)    Other than Tylenol, he isn't to have these because he's on Eliquis   Other Other (See Comments)    Unnamed topically-applied B/P patch = Caused redness   Prednisone Other (See Comments)    Suicidal thoughts   Spironolactone Swelling    Site of swelling not recalled   Codeine Itching and Nausea Only   Oxycodone-Acetaminophen Itching and Nausea Only    Medications: I have reviewed the patient's current medications.  Results for orders placed or performed during the hospital encounter of 03/14/21 (from the past 48 hour(s))  CBC with Differential     Status: Abnormal   Collection Time:  03/14/21  8:48 PM  Result Value Ref Range   WBC 6.8 4.0 - 10.5 K/uL   RBC 4.02 (L) 4.22 - 5.81 MIL/uL   Hemoglobin 12.0 (L) 13.0 - 17.0 g/dL   HCT 36.2 (L) 39.0 - 52.0 %   MCV 90.0 80.0 - 100.0 fL   MCH 29.9 26.0 - 34.0 pg   MCHC 33.1 30.0 - 36.0 g/dL   RDW 14.0 11.5 - 15.5 %   Platelets 258 150 - 400 K/uL   nRBC 0.0 0.0 - 0.2 %   Neutrophils Relative % 75 %   Neutro Abs 5.1 1.7 - 7.7 K/uL   Lymphocytes Relative 15 %   Lymphs Abs 1.0 0.7 - 4.0 K/uL   Monocytes Relative 7 %   Monocytes Absolute 0.5 0.1 - 1.0 K/uL   Eosinophils Relative 2 %   Eosinophils Absolute 0.1 0.0 - 0.5 K/uL   Basophils Relative 1 %   Basophils Absolute 0.1 0.0 - 0.1 K/uL   Immature Granulocytes 0 %   Abs Immature Granulocytes 0.03 0.00 - 0.07 K/uL    Comment: Performed at [facility], [address], [place], [place] [address]  Comprehensive metabolic panel     Status: Abnormal   Collection Time: 03/14/21  8:48 PM  Result Value Ref Range   Sodium 139 135 - 145 mmol/L   Potassium 4.7 3.5 - 5.1 mmol/L   Chloride 106 98 - 111 mmol/L   CO2 27 22 - 32 mmol/L   Glucose, Bld 173 (H) 70 - 99 mg/dL    Comment: Glucose reference range applies only to samples taken after fasting for at least 8 hours.   BUN 21 8 - 23 mg/dL   Creatinine, Ser 1.19 0.61 - 1.24 mg/dL   Calcium 8.8 (L) 8.9 - 10.3 mg/dL   Total Protein 6.3 (L) 6.5 - 8.1 g/dL   Albumin 3.5 3.5 - 5.0 g/dL   AST 16 15 - 41 U/L   ALT 8 0 - 44 U/L   Alkaline Phosphatase 61 38 - 126 U/L   Total Bilirubin 0.3 0.3 - 1.2 mg/dL    Comment: REPEATED TO VERIFY   GFR, Estimated 58 (L) >60 mL/min    Comment: (NOTE) Calculated using the CKD-EPI Creatinine Equation (2021)    Anion gap 6 5 - 15    Comment: Performed at [facility] [address] [place], [place] [address]  Resp Panel by RT-PCR (Flu A&B, Covid) Nasopharyngeal Swab     Status: None   Collection Time: 03/14/21  9:34 PM   Specimen: Nasopharyngeal Swab; Nasopharyngeal(NP) swabs in  vial transport medium  Result Value Ref Range   SARS Coronavirus 2 by RT PCR NEGATIVE  NEGATIVE    Comment: (NOTE) SARS-CoV-2 target nucleic acids are NOT DETECTED.  The SARS-CoV-2 RNA is generally detectable in upper respiratory specimens during the acute phase of infection. The lowest concentration of SARS-CoV-2 viral copies this assay can detect is 138 copies/mL. A negative result does not preclude SARS-Cov-2 infection and should not be used as the sole basis for treatment or other patient management decisions. A negative result may occur with  improper specimen collection/handling, submission of specimen other than nasopharyngeal swab, presence of viral mutation(s) within the areas targeted by this assay, and inadequate number of viral copies(<138 copies/mL). A negative result must be combined with clinical observations, patient history, and epidemiological information. The expected result is Negative.  Fact Sheet for Patients:  EntrepreneurPulse.com.au  Fact Sheet for Healthcare Providers:  IncredibleEmployment.be  This test is no t yet approved or cleared by the [place] FDA and  has been authorized for detection and/or diagnosis of SARS-CoV-2 by FDA under an Emergency Use Authorization (EUA). This EUA will remain  in effect (meaning this test can be used) for the duration of the COVID-19 declaration under Section 564(b)(1) of the Act, 21 U.S.C.section 360bbb-3(b)(1), unless the authorization is terminated  or revoked sooner.       Influenza A by PCR NEGATIVE NEGATIVE   Influenza B by PCR NEGATIVE NEGATIVE    Comment: (NOTE) The Xpert Xpress SARS-CoV-2/FLU/RSV plus assay is intended as an aid in the diagnosis of influenza from Nasopharyngeal swab specimens and should not be used as a sole basis for treatment. Nasal washings and aspirates are unacceptable for Xpert Xpress SARS-CoV-2/FLU/RSV testing.  Fact Sheet for  Patients: EntrepreneurPulse.com.au  Fact Sheet for Healthcare Providers: IncredibleEmployment.be  This test is not yet approved or cleared by the [place] FDA and has been authorized for detection and/or diagnosis of SARS-CoV-2 by FDA under an Emergency Use Authorization (EUA). This EUA will remain in effect (meaning this test can be used) for the duration of the COVID-19 declaration under Section 564(b)(1) of the Act, 21 U.S.C. section 360bbb-3(b)(1), unless the authorization is terminated or revoked.  Performed at [facility], [place] [address], [place], [place] [address]   Type and screen [place]     Status: None   Collection Time: 03/15/21  5:34 AM  Result Value Ref Range   ABO/RH(D) O POS    Antibody Screen NEG    Sample Expiration      03/18/2021,2359 Performed at [facility], [place] [address], [place], [place] [address]   Protime-INR     Status: Abnormal   Collection Time: 03/15/21  5:34 AM  Result Value Ref Range   Prothrombin Time 16.2 (H) 11.4 - 15.2 seconds   INR 1.3 (H) 0.8 - 1.2    Comment: (NOTE) INR goal varies based on device and disease states. Performed at [facility], [place] [address] [place], [place] [address]    *Note: Due to a large number of results and/or encounters for the requested time period, some results have not been displayed. A complete set of results can be found in Results Review.    CT Head Wo Contrast  Result Date: 03/14/2021 CLINICAL DATA:  Provided history: Head trauma, minor. Neck trauma. Additional history provided: Fall on blood thinners. EXAM: CT HEAD WITHOUT CONTRAST CT CERVICAL SPINE WITHOUT CONTRAST TECHNIQUE: Multidetector CT imaging of the head and cervical spine was performed following the standard protocol without intravenous contrast. Multiplanar CT image reconstructions of the  cervical spine were also generated. RADIATION DOSE REDUCTION: This exam was  performed according to the departmental dose-optimization program which includes automated exposure control, adjustment of the mA and/or kV according to patient size and/or use of iterative reconstruction technique. COMPARISON:  CT head/cervical spine 10/23/2018. FINDINGS: CT HEAD FINDINGS Brain: No age advanced or lobar predominant atrophy. Redemonstrated chronic cortical/subcortical infarct within the right occipital lobe. Mild patchy and ill-defined hypoattenuation elsewhere within the cerebral white matter, nonspecific but compatible with chronic small vessel ischemic disease. Redemonstrated chronic lacunar infarct in the right thalamus. Small focal hypodensity within the inferior left basal ganglia, likely reflecting a prominent perivascular space. Unchanged small chronic infarct within the left cerebellar hemisphere. There is no acute intracranial hemorrhage. No acute demarcated cortical infarct. No extra-axial fluid collection. No evidence of an intracranial mass. No midline shift. Vascular: No hyperdense vessel.  Atherosclerotic calcifications. Skull: Normal. Negative for fracture or focal lesion. Sinuses/Orbits: Visualized orbits show no acute finding. As before, the right maxillary sinus is asymmetrically small as compared to the left. Mild mucosal thickening and fluid within the right maxillary sinus. Moderate fluid and mucosal thickening within the left maxillary sinus. Redemonstrated small chronic defect within the anterior wall of the left maxillary sinus. Both maxillary sinuses also demonstrate chronic reactive osteitis. CT CERVICAL SPINE FINDINGS Alignment: No significant spondylolisthesis. Skull base and vertebrae: The basion-dental and atlanto-dental intervals are maintained.No evidence of acute fracture to the cervical spine. Prior C4-C6 ACDF. No evidence of acute hardware compromise. Vertebral ankylosis at the operative levels. Soft tissues and spinal canal: No prevertebral fluid or swelling. No  visible canal hematoma. Surgical clips within the right neck. Disc levels: Prior C4-C6 ACDF. No evidence of acute hardware compromise. Superimposed cervical spondylosis with multilevel disc bulges, endplate spurring, uncovertebral hypertrophy and facet arthrosis. Disc space narrowing is greatest at C6-C7 (advanced at this level). No appreciable high-grade spinal canal stenosis. Multilevel bony neural foraminal narrowing. Upper chest: No consolidation within the imaged lung apices. No visible pneumothorax. IMPRESSION: CT head: 1. No evidence of acute intracranial abnormality. 2. Redemonstrated chronic infarcts within the right thalamus and right occipital lobe. 3. Background mild chronic small vessel image changes within the cerebral white matter. 4. Redemonstrated small chronic infarct within the left cerebellar hemisphere. 5. Chronic bilateral maxillary sinusitis. CT cervical spine: 1. No evidence of acute fracture to the cervical spine. 2. Prior C4-C6 ACDF. No evidence of acute hardware compromise. Vertebral ankylosis at these levels. 3. Superimposed cervical spondylosis, as described. Electronically Signed   By: [name] D.O.   On: 03/14/2021 21:27   CT Cervical Spine Wo Contrast  Result Date: 03/14/2021 CLINICAL DATA:  Provided history: Head trauma, minor. Neck trauma. Additional history provided: Fall on blood thinners. EXAM: CT HEAD WITHOUT CONTRAST CT CERVICAL SPINE WITHOUT CONTRAST TECHNIQUE: Multidetector CT imaging of the head and cervical spine was performed following the standard protocol without intravenous contrast. Multiplanar CT image reconstructions of the cervical spine were also generated. RADIATION DOSE REDUCTION: This exam was performed according to the departmental dose-optimization program which includes automated exposure control, adjustment of the mA and/or kV according to patient size and/or use of iterative reconstruction technique. COMPARISON:  CT head/cervical spine 10/23/2018.  FINDINGS: CT HEAD FINDINGS Brain: No age advanced or lobar predominant atrophy. Redemonstrated chronic cortical/subcortical infarct within the right occipital lobe. Mild patchy and ill-defined hypoattenuation elsewhere within the cerebral white matter, nonspecific but compatible with chronic small vessel ischemic disease. Redemonstrated chronic lacunar infarct in the right thalamus. Small focal  hypodensity within the inferior left basal ganglia, likely reflecting a prominent perivascular space. Unchanged small chronic infarct within the left cerebellar hemisphere. There is no acute intracranial hemorrhage. No acute demarcated cortical infarct. No extra-axial fluid collection. No evidence of an intracranial mass. No midline shift. Vascular: No hyperdense vessel.  Atherosclerotic calcifications. Skull: Normal. Negative for fracture or focal lesion. Sinuses/Orbits: Visualized orbits show no acute finding. As before, the right maxillary sinus is asymmetrically small as compared to the left. Mild mucosal thickening and fluid within the right maxillary sinus. Moderate fluid and mucosal thickening within the left maxillary sinus. Redemonstrated small chronic defect within the anterior wall of the left maxillary sinus. Both maxillary sinuses also demonstrate chronic reactive osteitis. CT CERVICAL SPINE FINDINGS Alignment: No significant spondylolisthesis. Skull base and vertebrae: The basion-dental and atlanto-dental intervals are maintained.No evidence of acute fracture to the cervical spine. Prior C4-C6 ACDF. No evidence of acute hardware compromise. Vertebral ankylosis at the operative levels. Soft tissues and spinal canal: No prevertebral fluid or swelling. No visible canal hematoma. Surgical clips within the right neck. Disc levels: Prior C4-C6 ACDF. No evidence of acute hardware compromise. Superimposed cervical spondylosis with multilevel disc bulges, endplate spurring, uncovertebral hypertrophy and facet arthrosis.  Disc space narrowing is greatest at C6-C7 (advanced at this level). No appreciable high-grade spinal canal stenosis. Multilevel bony neural foraminal narrowing. Upper chest: No consolidation within the imaged lung apices. No visible pneumothorax. IMPRESSION: CT head: 1. No evidence of acute intracranial abnormality. 2. Redemonstrated chronic infarcts within the right thalamus and right occipital lobe. 3. Background mild chronic small vessel image changes within the cerebral white matter. 4. Redemonstrated small chronic infarct within the left cerebellar hemisphere. 5. Chronic bilateral maxillary sinusitis. CT cervical spine: 1. No evidence of acute fracture to the cervical spine. 2. Prior C4-C6 ACDF. No evidence of acute hardware compromise. Vertebral ankylosis at these levels. 3. Superimposed cervical spondylosis, as described. Electronically Signed   By: [name] D.O.   On: 03/14/2021 21:27   DG Pelvis Portable  Result Date: 03/14/2021 CLINICAL DATA:  Trauma, fall, right hip pain. EXAM: PORTABLE PELVIS 1-2 VIEWS COMPARISON:  Pelvis radiograph 10/24/2018 FINDINGS: Displaced right femoral neck fracture. There is apex lateral angulation and slight migration of the femoral shaft. Underlying right hip osteoarthritis. Left hip arthroplasty is partially visualized, intact where included. Pubic rami are intact. Brachytherapy seeds or surgical clips in the prostate bed. IMPRESSION: Displaced and angulated right femoral neck fracture. Electronically Signed   By: Keith Rake M.D.   On: 03/14/2021 21:06   DG Chest Portable 1 View  Result Date: 03/14/2021 CLINICAL DATA:  Fall, trauma. EXAM: PORTABLE CHEST 1 VIEW COMPARISON:  Radiograph 02/05/2021, CT 07/09/2020 FINDINGS: Anti lordotic positioning, the patient is rotated. Chronic cardiomegaly. Aortic atherosclerosis. Unchanged blunting of left costophrenic angle due to chronic pleural effusion. No pneumothorax. No acute airspace disease. No acute osseous  abnormalities are seen on this limited portable exam. IMPRESSION: 1. No acute findings. 2. Chronic cardiomegaly and left pleural effusion. 3. Limited rotated exam. Electronically Signed   By: Keith Rake M.D.   On: 03/14/2021 21:04   DG Femur Min 2 Views Right  Result Date: 03/14/2021 CLINICAL DATA:  Fall, right hip pain. EXAM: RIGHT FEMUR 2 VIEWS COMPARISON:  None. FINDINGS: Displaced right femoral neck fracture with slight proximal migration of the femoral shaft. The distal femur is intact. Knee alignment is difficult to assess on provided views. Overlying artifacts. IMPRESSION: Displaced right femoral neck fracture. Electronically Signed  By: Keith Rake M.D.   On: 03/14/2021 21:07    Review of Systems Blood pressure (!) 185/82, pulse (!) 101, temperature 98.3 F (36.8 C), temperature source Oral, resp. rate (!) 21, height 5\' 11"  (1.803 m), weight 66.2 kg, SpO2 90 %. Physical Exam Patient is awake and responds appropriately to commands. Neck: non tender with pain free AROM. Bilateral shoulders, elbows and wrists are nontender with no deformity and normal ROM and strength. T and L spine nontender. Ribs non tender, normal chest rise. Pelvis stable Right LE with painful passive ROM, NVI distally. Some pain with rotation of the left hip, NVI distally  Assessment/Plan: Displaced right femoral neck fracture. Dr Lyla Glassing is aware of the patient and plans right hip arthroplasty today. Last Eliquis dose was yesterday morning. Family is aware Left hip s/p hemi-arthroplasty with some pain with PROM. Will get some left femur films.  Maintain NPO  Augustin Schooling 03/15/2021, 7:29 AM         ADDENDUM: Surgery scheduled for 11:30 today. Continue NPO. Hold chemical DVT ppx. Kcentra ordered for 0930. Will resume Eliquis tomorrow am.   Bertram Savin, MD 03/15/21 7:48 AM

## 2021-03-15 NOTE — Anesthesia Postprocedure Evaluation (Signed)
Anesthesia Post Note ? ?Patient: [name] ? ?Procedure(s) Performed: AN AD HOC NERVE BLOCK ? ?  ? ?Patient location during evaluation: PACU ?Anesthesia Type: Regional ?Level of consciousness: awake and alert ?Pain management: pain level controlled ?Vital Signs Assessment: post-procedure vital signs reviewed and stable ?Respiratory status: spontaneous breathing, nonlabored ventilation, respiratory function stable and patient connected to nasal cannula oxygen ?Cardiovascular status: stable and blood pressure returned to baseline ?Postop Assessment: no apparent nausea or vomiting ?Anesthetic complications: no ? ? ?No notable events documented. ? ?Last Vitals:  ?Vitals:  ? 03/15/21 1115 03/15/21 1119  ?BP: (!) 162/51 (!) 158/73  ?Pulse: 85 80  ?Resp: 18 16  ?Temp:    ?SpO2: 95% 96%  ?  ?Last Pain:  ?Vitals:  ? 03/15/21 1109  ?TempSrc:   ?PainSc: Asleep  ? ? ?  ?  ?  ?  ?  ?  ? ?[name] ? ? ? ? ?

## 2021-03-15 NOTE — Progress Notes (Signed)
Orthopedic Tech Progress Note ?Patient Details:  ?[name] ?08-04-1929 ?[id number] ? ? ?Patient ID: [name], male   DOB: 03/20/1929, 88 y.o.   MRN: [id number] ? ?No OHF due to age restrictions. ? ?Carin Primrose ?03/15/2021, 7:26 AM ? ?

## 2021-03-15 NOTE — Anesthesia Preprocedure Evaluation (Addendum)
Anesthesia Evaluation  ?Patient identified by MRN, date of birth, ID band ?Patient awake ? ? ? ?Reviewed: ?Allergy & Precautions, NPO status , Patient's Chart, lab work & pertinent test results ? ?Airway ?Mallampati: II ? ?TM Distance: >3 FB ?Neck ROM: Full ? ? ? Dental ?no notable dental hx. ? ?  ?Pulmonary ?asthma , former smoker,  ?  ?Pulmonary exam normal ? ? ? ? ? ? ? Cardiovascular ?hypertension, Pt. on medications ?+CHF  ?+ dysrhythmias (on Eliquis) Atrial Fibrillation  ?Rhythm:Regular Rate:Normal ? ? ?  ?Neuro/Psych ? Headaches, Anxiety Depression CVA   ? GI/Hepatic ?Neg liver ROS, GERD  Medicated,  ?Endo/Other  ?diabetes ? Renal/GU ?negative Renal ROS  ?negative genitourinary ?  ?Musculoskeletal ? ?(+) Arthritis , Osteoarthritis,  Hip fracture  ? Abdominal ?Normal abdominal exam  (+)   ?Peds ? Hematology ? ?(+) Blood dyscrasia, anemia ,   ?Anesthesia Other Findings ? ? Reproductive/Obstetrics ? ?  ? ? ? ? ? ? ? ? ? ? ? ? ? ?  ?  ? ? ? ? ? ? ? ?Anesthesia Physical ?Anesthesia Plan ? ?ASA: 3 ? ?Anesthesia Plan: Regional  ? ?Post-op Pain Management: Regional block*  ? ?Induction:  ? ?PONV Risk Score and Plan: 1 and Treatment may vary due to age or medical condition ? ?Airway Management Planned: Simple Face Mask, Natural Airway and Nasal Cannula ? ?Additional Equipment: None ? ?Intra-op Plan:  ? ?Post-operative Plan:  ? ?Informed Consent:  ? ?Plan Discussed with:  ? ?Anesthesia Plan Comments: (ECHO 2022: ??1. Left ventricular ejection fraction, by estimation, is 65 to 70%. The  ?left ventricle has normal function. The left ventricle has no regional  ?wall motion abnormalities. Left ventricular diastolic parameters are  ?consistent with Grade I diastolic  ?dysfunction (impaired relaxation).  ??2. Right ventricular systolic function is normal. The right ventricular  ?size is normal. There is mildly elevated pulmonary artery systolic  ?pressure. The estimated right ventricular  systolic pressure is 03.4 mmHg.  ??3. Right atrial size was mildly dilated.  ??4. The mitral valve is grossly normal. No evidence of mitral valve  ?regurgitation. No evidence of mitral stenosis.  ??5. The aortic valve is tricuspid. Aortic valve regurgitation is not  ?visualized. No aortic stenosis is present.  ??6. The inferior vena cava is dilated in size with >50% respiratory  ?variability, suggesting right atrial pressure of 8 mmHg.  ? ?Comparison(s): Changes from prior study are noted. EF unchanged. G1DD.  ?Elevated RVSP~43 mmHG. )  ? ? ? ? ? ? ?Anesthesia Quick Evaluation ? ?

## 2021-03-15 NOTE — Anesthesia Postprocedure Evaluation (Signed)
Anesthesia Post Note ? ?Patient: [name] ? ?Procedure(s) Performed: ANTERIOR APPROACH HEMI HIP ARTHROPLASTY (Right: Hip) ? ?  ? ?Patient location during evaluation: PACU ?Anesthesia Type: General ?Level of consciousness: awake and alert ?Pain management: pain level controlled ?Vital Signs Assessment: post-procedure vital signs reviewed and stable ?Respiratory status: spontaneous breathing, nonlabored ventilation, respiratory function stable and patient connected to nasal cannula oxygen ?Cardiovascular status: blood pressure returned to baseline and stable ?Postop Assessment: no apparent nausea or vomiting ?Anesthetic complications: no ? ? ?No notable events documented. ? ?Last Vitals:  ?Vitals:  ? 03/15/21 1526 03/15/21 1551  ?BP: (!) 127/55 (!) 124/51  ?Pulse: 63 63  ?Resp: 12 16  ?Temp: 36.8 ?C 36.8 ?C  ?SpO2: 100% 100%  ?  ?Last Pain:  ?Vitals:  ? 03/15/21 1614  ?TempSrc:   ?PainSc: 0-No pain  ? ? ?  ?  ?  ?  ?  ?  ? ?March Rummage Kerolos Nehme ? ? ? ? ?

## 2021-03-15 NOTE — Interval H&P Note (Signed)
History and Physical Interval Note: ? ?03/15/2021 ?11:09 AM ? ?[name]  has presented today for surgery, with the diagnosis of RIGHT DISPLACED FEMORAL FRACTURE.  The various methods of treatment have been discussed with the patient and family. After consideration of risks, benefits and other options for treatment, the patient has consented to  Procedure(s): ?ANTERIOR APPROACH HEMI HIP ARTHROPLASTY (Right) as a surgical intervention.  The patient's history has been reviewed, patient examined, no change in status, stable for surgery.  I have reviewed the patient's chart and labs.  Questions were answered to the patient's and family's satisfaction.   ? ?The risks, benefits, and alternatives were discussed with the patient/family. There are risks associated with the surgery including, but not limited to, problems with anesthesia (death), infection, instability (giving out of the joint), dislocation, differences in leg length/angulation/rotation, fracture of bones, loosening or failure of implants, hematoma (blood accumulation) which may require surgical drainage, blood clots, pulmonary embolism, nerve injury (foot drop and lateral thigh numbness), and blood vessel injury. The patient/family understand these risks and elects to proceed. ? ? ? ?[name] ? ? ?

## 2021-03-15 NOTE — Consult Note (Addendum)
Reason for Consult:Right hip fracture Referring Physician: [facility]  [name] is an 88 y.o. male.  HPI: 88 yo male with multiple medical problems who is on Eliquis presents after fall at a restaurant yesterday. Patient reported immediate right hip pain and was unable to stand after the fall. 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 He denies any other injuries. Patient and family are requesting [facility] for eval and treatment.  Past Medical History:  Diagnosis Date   Acute bronchitis 06/10/2015   Allergic rhinitis    Anemia    Asthma    Carotid stenosis    a. s/p Left CEA 2002;  b. Carotid US 3/16:  patent R CEA, L < 40%   Chronic pain syndrome    Left shoulder, back   Diverticulosis    Dyslipidemia    Epigastric pain 04/05/2016   Esophageal stricture    Distal, benign   GERD (gastroesophageal reflux disease)    Hemorrhoids    HTN (hypertension)    Negative renal duplex 07-22-11   Hx of echocardiogram    a. Echo 10/11: mod LVH, EF 55-60%   Pleural effusion 07/17/2020   Prostate cancer ([place]) 2000   Seed XRT   Spondylosis, cervical, with myelopathy 11/26/2015   Stroke ([place]) 8/01   right thalamic - on chronic Plavix/ASA    Past Surgical History:  Procedure Laterality Date   APPENDECTOMY  1953   CAROTID ENDARTERECTOMY Right 01/2000   CATARACT EXTRACTION Bilateral    CERVICAL [place] SURGERY  8/07   CHEST TUBE INSERTION Left 11/11/2020   Procedure: INSERTION PLEURAL DRAINAGE CATHETER;  Surgeon: [name], DO;  Location: [facility];  Service: Pulmonary;  Laterality: Left;  Indwelling Pleural Catheter   HIP ARTHROPLASTY Left 10/24/2018   Procedure: ARTHROPLASTY BIPOLAR HIP (HEMIARTHROPLASTY);  Surgeon: Paralee Cancel, MD;  Location: [facility];  Service: Orthopedics;  Laterality: Left;   INSERTION PROSTATE RADIATION SEED  2000   LUMBAR DISC SURGERY  1990s   NASAL SINUS SURGERY     x 3   REMOVAL OF PLEURAL DRAINAGE CATHETER N/A 12/24/2020    Procedure: MINOR REMOVAL OF PLEURAL DRAINAGE CATHETER;  Surgeon: [name], DO;  Location: [facility];  Service: Cardiopulmonary;  Laterality: N/A;   THORACENTESIS N/A 08/15/2020   Procedure: THORACENTESIS;  Surgeon: [name], DO;  Location: [facility];  Service: Pulmonary;  Laterality: N/A;    Family History  Problem Relation Age of Onset   Stroke Brother    Stroke Sister    Stroke Mother    Hyperlipidemia Mother    Hypertension Mother    Lung disease Father    Diabetes Son    Gout Son    Obesity Sister    Alcohol abuse Brother    Cancer Brother        lung, smoker    Social History:  reports that he quit smoking about 43 years ago. His smoking use included cigarettes. He started smoking about 73 years ago. He has a 8.75 pack-year smoking history. His smokeless tobacco use includes chew. He reports that he does not drink alcohol and does not use drugs.  Allergies:  Allergies  Allergen Reactions   Hctz [Hydrochlorothiazide] Shortness Of Breath, Swelling and Other (See Comments)    "Swelling and dyspnea"    Hydralazine Shortness Of Breath and Other (See Comments)    Chest pain and GI issues also   Diovan [Valsartan] Other (See Comments)    Elevated  potassium- Hyperkalemia   Metformin And Related Other (See Comments)    Reaction not recalled   Nsaids Other (See Comments)    Other than Tylenol, he isn't to have these because he's on Eliquis   Other Other (See Comments)    Unnamed topically-applied B/P patch = Caused redness   Prednisone Other (See Comments)    Suicidal thoughts   Spironolactone Swelling    Site of swelling not recalled   Codeine Itching and Nausea Only   Oxycodone-Acetaminophen Itching and Nausea Only    Medications: I have reviewed the patient's current medications.  Results for orders placed or performed during the hospital encounter of 03/14/21 (from the past 48 hour(s))  CBC with Differential     Status: Abnormal   Collection Time:  03/14/21  8:48 PM  Result Value Ref Range   WBC 6.8 4.0 - 10.5 K/uL   RBC 4.02 (L) 4.22 - 5.81 MIL/uL   Hemoglobin 12.0 (L) 13.0 - 17.0 g/dL   HCT 36.2 (L) 39.0 - 52.0 %   MCV 90.0 80.0 - 100.0 fL   MCH 29.9 26.0 - 34.0 pg   MCHC 33.1 30.0 - 36.0 g/dL   RDW 14.0 11.5 - 15.5 %   Platelets 258 150 - 400 K/uL   nRBC 0.0 0.0 - 0.2 %   Neutrophils Relative % 75 %   Neutro Abs 5.1 1.7 - 7.7 K/uL   Lymphocytes Relative 15 %   Lymphs Abs 1.0 0.7 - 4.0 K/uL   Monocytes Relative 7 %   Monocytes Absolute 0.5 0.1 - 1.0 K/uL   Eosinophils Relative 2 %   Eosinophils Absolute 0.1 0.0 - 0.5 K/uL   Basophils Relative 1 %   Basophils Absolute 0.1 0.0 - 0.1 K/uL   Immature Granulocytes 0 %   Abs Immature Granulocytes 0.03 0.00 - 0.07 K/uL    Comment: Performed at [facility], [address] [place], [place] [address]  Comprehensive metabolic panel     Status: Abnormal   Collection Time: 03/14/21  8:48 PM  Result Value Ref Range   Sodium 139 135 - 145 mmol/L   Potassium 4.7 3.5 - 5.1 mmol/L   Chloride 106 98 - 111 mmol/L   CO2 27 22 - 32 mmol/L   Glucose, Bld 173 (H) 70 - 99 mg/dL    Comment: Glucose reference range applies only to samples taken after fasting for at least 8 hours.   BUN 21 8 - 23 mg/dL   Creatinine, Ser 1.19 0.61 - 1.24 mg/dL   Calcium 8.8 (L) 8.9 - 10.3 mg/dL   Total Protein 6.3 (L) 6.5 - 8.1 g/dL   Albumin 3.5 3.5 - 5.0 g/dL   AST 16 15 - 41 U/L   ALT 8 0 - 44 U/L   Alkaline Phosphatase 61 38 - 126 U/L   Total Bilirubin 0.3 0.3 - 1.2 mg/dL    Comment: REPEATED TO VERIFY   GFR, Estimated 58 (L) >60 mL/min    Comment: (NOTE) Calculated using the CKD-EPI Creatinine Equation (2021)    Anion gap 6 5 - 15    Comment: Performed at [facility] [address], [place], [place] [address]  Resp Panel by RT-PCR (Flu A&B, Covid) Nasopharyngeal Swab     Status: None   Collection Time: 03/14/21  9:34 PM   Specimen: Nasopharyngeal Swab; Nasopharyngeal(NP) swabs in  vial transport medium  Result Value Ref Range   SARS Coronavirus 2 by RT PCR NEGATIVE  NEGATIVE    Comment: (NOTE) SARS-CoV-2 target nucleic acids are NOT DETECTED.  The SARS-CoV-2 RNA is generally detectable in upper respiratory specimens during the acute phase of infection. The lowest concentration of SARS-CoV-2 viral copies this assay can detect is 138 copies/mL. A negative result does not preclude SARS-Cov-2 infection and should not be used as the sole basis for treatment or other patient management decisions. A negative result may occur with  improper specimen collection/handling, submission of specimen other than nasopharyngeal swab, presence of viral mutation(s) within the areas targeted by this assay, and inadequate number of viral copies(<138 copies/mL). A negative result must be combined with clinical observations, patient history, and epidemiological information. The expected result is Negative.  Fact Sheet for Patients:  EntrepreneurPulse.com.au  Fact Sheet for Healthcare Providers:  IncredibleEmployment.be  This test is no t yet approved or cleared by the [place] FDA and  has been authorized for detection and/or diagnosis of SARS-CoV-2 by FDA under an Emergency Use Authorization (EUA). This EUA will remain  in effect (meaning this test can be used) for the duration of the COVID-19 declaration under Section 564(b)(1) of the Act, 21 U.S.C.section 360bbb-3(b)(1), unless the authorization is terminated  or revoked sooner.       Influenza A by PCR NEGATIVE NEGATIVE   Influenza B by PCR NEGATIVE NEGATIVE    Comment: (NOTE) The Xpert Xpress SARS-CoV-2/FLU/RSV plus assay is intended as an aid in the diagnosis of influenza from Nasopharyngeal swab specimens and should not be used as a sole basis for treatment. Nasal washings and aspirates are unacceptable for Xpert Xpress SARS-CoV-2/FLU/RSV testing.  Fact Sheet for  Patients: EntrepreneurPulse.com.au  Fact Sheet for Healthcare Providers: IncredibleEmployment.be  This test is not yet approved or cleared by the [place] FDA and has been authorized for detection and/or diagnosis of SARS-CoV-2 by FDA under an Emergency Use Authorization (EUA). This EUA will remain in effect (meaning this test can be used) for the duration of the COVID-19 declaration under Section 564(b)(1) of the Act, 21 U.S.C. section 360bbb-3(b)(1), unless the authorization is terminated or revoked.  Performed at [facility], [place] [address], [place], [place] [address]   Type and screen [place]     Status: None   Collection Time: 03/15/21  5:34 AM  Result Value Ref Range   ABO/RH(D) O POS    Antibody Screen NEG    Sample Expiration      03/18/2021,2359 Performed at [facility], [place] [address], [place], [place] [address]   Protime-INR     Status: Abnormal   Collection Time: 03/15/21  5:34 AM  Result Value Ref Range   Prothrombin Time 16.2 (H) 11.4 - 15.2 seconds   INR 1.3 (H) 0.8 - 1.2    Comment: (NOTE) INR goal varies based on device and disease states. Performed at [facility], [place] [address], [place], [place] [address]    *Note: Due to a large number of results and/or encounters for the requested time period, some results have not been displayed. A complete set of results can be found in Results Review.    CT Head Wo Contrast  Result Date: 03/14/2021 CLINICAL DATA:  Provided history: Head trauma, minor. Neck trauma. Additional history provided: Fall on blood thinners. EXAM: CT HEAD WITHOUT CONTRAST CT CERVICAL SPINE WITHOUT CONTRAST TECHNIQUE: Multidetector CT imaging of the head and cervical spine was performed following the standard protocol without intravenous contrast. Multiplanar CT image reconstructions of the  cervical spine were also generated. RADIATION DOSE REDUCTION: This exam was  performed according to the departmental dose-optimization program which includes automated exposure control, adjustment of the mA and/or kV according to patient size and/or use of iterative reconstruction technique. COMPARISON:  CT head/cervical spine 10/23/2018. FINDINGS: CT HEAD FINDINGS Brain: No age advanced or lobar predominant atrophy. Redemonstrated chronic cortical/subcortical infarct within the right occipital lobe. Mild patchy and ill-defined hypoattenuation elsewhere within the cerebral white matter, nonspecific but compatible with chronic small vessel ischemic disease. Redemonstrated chronic lacunar infarct in the right thalamus. Small focal hypodensity within the inferior left basal ganglia, likely reflecting a prominent perivascular space. Unchanged small chronic infarct within the left cerebellar hemisphere. There is no acute intracranial hemorrhage. No acute demarcated cortical infarct. No extra-axial fluid collection. No evidence of an intracranial mass. No midline shift. Vascular: No hyperdense vessel.  Atherosclerotic calcifications. Skull: Normal. Negative for fracture or focal lesion. Sinuses/Orbits: Visualized orbits show no acute finding. As before, the right maxillary sinus is asymmetrically small as compared to the left. Mild mucosal thickening and fluid within the right maxillary sinus. Moderate fluid and mucosal thickening within the left maxillary sinus. Redemonstrated small chronic defect within the anterior wall of the left maxillary sinus. Both maxillary sinuses also demonstrate chronic reactive osteitis. CT CERVICAL SPINE FINDINGS Alignment: No significant spondylolisthesis. Skull base and vertebrae: The basion-dental and atlanto-dental intervals are maintained.No evidence of acute fracture to the cervical spine. Prior C4-C6 ACDF. No evidence of acute hardware compromise. Vertebral ankylosis at the operative levels. Soft tissues and spinal canal: No prevertebral fluid or swelling. No  visible canal hematoma. Surgical clips within the right neck. Disc levels: Prior C4-C6 ACDF. No evidence of acute hardware compromise. Superimposed cervical spondylosis with multilevel disc bulges, endplate spurring, uncovertebral hypertrophy and facet arthrosis. Disc space narrowing is greatest at C6-C7 (advanced at this level). No appreciable high-grade spinal canal stenosis. Multilevel bony neural foraminal narrowing. Upper chest: No consolidation within the imaged lung apices. No visible pneumothorax. IMPRESSION: CT head: 1. No evidence of acute intracranial abnormality. 2. Redemonstrated chronic infarcts within the right thalamus and right occipital lobe. 3. Background mild chronic small vessel image changes within the cerebral white matter. 4. Redemonstrated small chronic infarct within the left cerebellar hemisphere. 5. Chronic bilateral maxillary sinusitis. CT cervical spine: 1. No evidence of acute fracture to the cervical spine. 2. Prior C4-C6 ACDF. No evidence of acute hardware compromise. Vertebral ankylosis at these levels. 3. Superimposed cervical spondylosis, as described. Electronically Signed   By: [name] D.O.   On: 03/14/2021 21:27   CT Cervical Spine Wo Contrast  Result Date: 03/14/2021 CLINICAL DATA:  Provided history: Head trauma, minor. Neck trauma. Additional history provided: Fall on blood thinners. EXAM: CT HEAD WITHOUT CONTRAST CT CERVICAL SPINE WITHOUT CONTRAST TECHNIQUE: Multidetector CT imaging of the head and cervical spine was performed following the standard protocol without intravenous contrast. Multiplanar CT image reconstructions of the cervical spine were also generated. RADIATION DOSE REDUCTION: This exam was performed according to the departmental dose-optimization program which includes automated exposure control, adjustment of the mA and/or kV according to patient size and/or use of iterative reconstruction technique. COMPARISON:  CT head/cervical spine 10/23/2018.  FINDINGS: CT HEAD FINDINGS Brain: No age advanced or lobar predominant atrophy. Redemonstrated chronic cortical/subcortical infarct within the right occipital lobe. Mild patchy and ill-defined hypoattenuation elsewhere within the cerebral white matter, nonspecific but compatible with chronic small vessel ischemic disease. Redemonstrated chronic lacunar infarct in the right thalamus. Small focal  hypodensity within the inferior left basal ganglia, likely reflecting a prominent perivascular space. Unchanged small chronic infarct within the left cerebellar hemisphere. There is no acute intracranial hemorrhage. No acute demarcated cortical infarct. No extra-axial fluid collection. No evidence of an intracranial mass. No midline shift. Vascular: No hyperdense vessel.  Atherosclerotic calcifications. Skull: Normal. Negative for fracture or focal lesion. Sinuses/Orbits: Visualized orbits show no acute finding. As before, the right maxillary sinus is asymmetrically small as compared to the left. Mild mucosal thickening and fluid within the right maxillary sinus. Moderate fluid and mucosal thickening within the left maxillary sinus. Redemonstrated small chronic defect within the anterior wall of the left maxillary sinus. Both maxillary sinuses also demonstrate chronic reactive osteitis. CT CERVICAL SPINE FINDINGS Alignment: No significant spondylolisthesis. Skull base and vertebrae: The basion-dental and atlanto-dental intervals are maintained.No evidence of acute fracture to the cervical spine. Prior C4-C6 ACDF. No evidence of acute hardware compromise. Vertebral ankylosis at the operative levels. Soft tissues and spinal canal: No prevertebral fluid or swelling. No visible canal hematoma. Surgical clips within the right neck. Disc levels: Prior C4-C6 ACDF. No evidence of acute hardware compromise. Superimposed cervical spondylosis with multilevel disc bulges, endplate spurring, uncovertebral hypertrophy and facet arthrosis.  Disc space narrowing is greatest at C6-C7 (advanced at this level). No appreciable high-grade spinal canal stenosis. Multilevel bony neural foraminal narrowing. Upper chest: No consolidation within the imaged lung apices. No visible pneumothorax. IMPRESSION: CT head: 1. No evidence of acute intracranial abnormality. 2. Redemonstrated chronic infarcts within the right thalamus and right occipital lobe. 3. Background mild chronic small vessel image changes within the cerebral white matter. 4. Redemonstrated small chronic infarct within the left cerebellar hemisphere. 5. Chronic bilateral maxillary sinusitis. CT cervical spine: 1. No evidence of acute fracture to the cervical spine. 2. Prior C4-C6 ACDF. No evidence of acute hardware compromise. Vertebral ankylosis at these levels. 3. Superimposed cervical spondylosis, as described. Electronically Signed   By: [name] D.O.   On: 03/14/2021 21:27   DG Pelvis Portable  Result Date: 03/14/2021 CLINICAL DATA:  Trauma, fall, right hip pain. EXAM: PORTABLE PELVIS 1-2 VIEWS COMPARISON:  Pelvis radiograph 10/24/2018 FINDINGS: Displaced right femoral neck fracture. There is apex lateral angulation and slight migration of the femoral shaft. Underlying right hip osteoarthritis. Left hip arthroplasty is partially visualized, intact where included. Pubic rami are intact. Brachytherapy seeds or surgical clips in the prostate bed. IMPRESSION: Displaced and angulated right femoral neck fracture. Electronically Signed   By: Keith Rake M.D.   On: 03/14/2021 21:06   DG Chest Portable 1 View  Result Date: 03/14/2021 CLINICAL DATA:  Fall, trauma. EXAM: PORTABLE CHEST 1 VIEW COMPARISON:  Radiograph 02/05/2021, CT 07/09/2020 FINDINGS: Anti lordotic positioning, the patient is rotated. Chronic cardiomegaly. Aortic atherosclerosis. Unchanged blunting of left costophrenic angle due to chronic pleural effusion. No pneumothorax. No acute airspace disease. No acute osseous  abnormalities are seen on this limited portable exam. IMPRESSION: 1. No acute findings. 2. Chronic cardiomegaly and left pleural effusion. 3. Limited rotated exam. Electronically Signed   By: Keith Rake M.D.   On: 03/14/2021 21:04   DG Femur Min 2 Views Right  Result Date: 03/14/2021 CLINICAL DATA:  Fall, right hip pain. EXAM: RIGHT FEMUR 2 VIEWS COMPARISON:  None. FINDINGS: Displaced right femoral neck fracture with slight proximal migration of the femoral shaft. The distal femur is intact. Knee alignment is difficult to assess on provided views. Overlying artifacts. IMPRESSION: Displaced right femoral neck fracture. Electronically Signed  By: Keith Rake M.D.   On: 03/14/2021 21:07    Review of Systems Blood pressure (!) 185/82, pulse (!) 101, temperature 98.3 F (36.8 C), temperature source Oral, resp. rate (!) 21, height 5\' 11"  (1.803 m), weight 66.2 kg, SpO2 90 %. Physical Exam Patient is awake and responds appropriately to commands. Neck: non tender with pain free AROM. Bilateral shoulders, elbows and wrists are nontender with no deformity and normal ROM and strength. T and L spine nontender. Ribs non tender, normal chest rise. Pelvis stable Right LE with painful passive ROM, NVI distally. Some pain with rotation of the left hip, NVI distally  Assessment/Plan: Displaced right femoral neck fracture. Dr Lyla Glassing is aware of the patient and plans right hip arthroplasty today. Last Eliquis dose was yesterday morning. Family is aware Left hip s/p hemi-arthroplasty with some pain with PROM. Will get some left femur films.  Maintain NPO  Augustin Schooling 03/15/2021, 7:29 AM         ADDENDUM: Surgery scheduled for 11:30 today. Continue NPO. Hold chemical DVT ppx. Kcentra ordered for 0930. Will resume Eliquis tomorrow am.   Bertram Savin, MD 03/15/21 7:48 AM

## 2021-03-15 NOTE — Transfer of Care (Signed)
Immediate Anesthesia Transfer of Care Note ? ?Patient: [name] ? ?Procedure(s) Performed: ANTERIOR APPROACH HEMI HIP ARTHROPLASTY (Right: Hip) ? ?Patient Location: PACU ? ?Anesthesia Type:General ? ?Level of Consciousness: awake, alert  and oriented ? ?Airway & Oxygen Therapy: Patient Spontanous Breathing and Patient connected to face mask oxygen ? ?Post-op Assessment: Report given to RN, Post -op Vital signs reviewed and stable and Patient moving all extremities ? ?Post vital signs: Reviewed and stable ? ?Last Vitals:  ?Vitals Value Taken Time  ?BP 136/98 03/15/21 1425  ?Temp    ?Pulse 84 03/15/21 1428  ?Resp 23 03/15/21 1428  ?SpO2 100 % 03/15/21 1428  ?Vitals shown include unvalidated device data. ? ?Last Pain:  ?Vitals:  ? 03/15/21 1109  ?TempSrc:   ?PainSc: Asleep  ?   ? ?  ? ?Complications: No notable events documented. ?

## 2021-03-15 NOTE — H&P (Signed)
History and Physical    [name]:[id number] DOB: 12/14/1929 DOA: 03/14/2021  PCP: [name], MD  Patient coming from: Home.  Chief Complaint: Fall.  HPI: [name] is a 88 y.o. male with history of A-fib on Eliquis, hypertension, chronic anemia, history of recurrent left pleural effusion requiring multiple thoracentesis was brought to the ER after patient had a fall at a restaurant.  Patient missed his balance and fell onto the floor.  Denies losing consciousness.  ED Course: CT head and C-spine were unremarkable.  X-rays revealed right hip fracture.  COVID test negative.  Patient admitted for further work-up.  On-call orthopedic surgeon was consulted.  Review of Systems: As per HPI, rest all negative.   Past Medical History:  Diagnosis Date   Acute bronchitis 06/10/2015   Allergic rhinitis    Anemia    Asthma    Carotid stenosis    a. s/p Left CEA 2002;  b. Carotid US 3/16:  patent R CEA, L < 40%   Chronic pain syndrome    Left shoulder, back   Diverticulosis    Dyslipidemia    Epigastric pain 04/05/2016   Esophageal stricture    Distal, benign   GERD (gastroesophageal reflux disease)    Hemorrhoids    HTN (hypertension)    Negative renal duplex 07-22-11   Hx of echocardiogram    a. Echo 10/11: mod LVH, EF 55-60%   Pleural effusion 07/17/2020   Prostate cancer ([place]) 2000   Seed XRT   Spondylosis, cervical, with myelopathy 11/26/2015   Stroke ([place]) 8/01   right thalamic - on chronic Plavix/ASA    Past Surgical History:  Procedure Laterality Date   APPENDECTOMY  1953   CAROTID ENDARTERECTOMY Right 01/2000   CATARACT EXTRACTION Bilateral    CERVICAL [place] SURGERY  8/07   CHEST TUBE INSERTION Left 11/11/2020   Procedure: INSERTION PLEURAL DRAINAGE CATHETER;  Surgeon: [name], DO;  Location: [facility];  Service: Pulmonary;  Laterality: Left;  Indwelling Pleural Catheter   HIP ARTHROPLASTY Left 10/24/2018   Procedure: ARTHROPLASTY BIPOLAR HIP  (HEMIARTHROPLASTY);  Surgeon: Paralee Cancel, MD;  Location: [facility];  Service: Orthopedics;  Laterality: Left;   INSERTION PROSTATE RADIATION SEED  2000   LUMBAR DISC SURGERY  1990s   NASAL SINUS SURGERY     x 3   REMOVAL OF PLEURAL DRAINAGE CATHETER N/A 12/24/2020   Procedure: MINOR REMOVAL OF PLEURAL DRAINAGE CATHETER;  Surgeon: [name], DO;  Location: [facility];  Service: Cardiopulmonary;  Laterality: N/A;   THORACENTESIS N/A 08/15/2020   Procedure: THORACENTESIS;  Surgeon: [name], DO;  Location: [facility];  Service: Pulmonary;  Laterality: N/A;     reports that he quit smoking about 43 years ago. His smoking use included cigarettes. He started smoking about 73 years ago. He has a 8.75 pack-year smoking history. His smokeless tobacco use includes chew. He reports that he does not drink alcohol and does not use drugs.  Allergies  Allergen Reactions   Hctz [Hydrochlorothiazide] Shortness Of Breath, Swelling and Other (See Comments)    "Swelling and dyspnea"    Hydralazine Shortness Of Breath and Other (See Comments)    Chest pain and GI issues also   Diovan [Valsartan] Other (See Comments)    Elevated potassium- Hyperkalemia   Metformin And Related Other (See Comments)    Reaction not recalled   Nsaids Other (See Comments)    Other than Tylenol, he isn't to have these  because he's on Eliquis   Other Other (See Comments)    Unnamed topically-applied B/P patch = Caused redness   Prednisone Other (See Comments)    Suicidal thoughts   Spironolactone Swelling    Site of swelling not recalled   Codeine Itching and Nausea Only   Oxycodone-Acetaminophen Itching and Nausea Only    Family History  Problem Relation Age of Onset   Stroke Brother    Stroke Sister    Stroke Mother    Hyperlipidemia Mother    Hypertension Mother    Lung disease Father    Diabetes Son    Gout Son    Obesity Sister    Alcohol abuse Brother    Cancer Brother        lung, smoker     Prior to Admission medications   Medication Sig Start Date End Date Taking? Authorizing Provider  acetaminophen (TYLENOL) 500 MG tablet Take 1,000 mg by mouth every 6 (six) hours as needed for mild pain or headache.   Yes [provider]  albuterol (VENTOLIN HFA) 108 (90 Base) MCG/ACT inhaler INHALE 2 PUFFS BY MOUTH INTO THE LUNGS EVERY 6 HOURS AS NEEDED Patient taking differently: Inhale 2 puffs into the lungs every 6 (six) hours as needed for shortness of breath or wheezing. 02/13/20 03/14/21 Yes Saguier, Percell Miller, PA-C  amLODipine (NORVASC) 5 MG tablet TAKE 2 TABLETS BY MOUTH DAILY Patient taking differently: Take 10 mg by mouth daily. 12/23/20  Yes Fay Records, MD  apixaban (ELIQUIS) 5 MG TABS tablet TAKE 1 TABLET BY MOUTH 2 TIMES DAILY Patient taking differently: Take 5 mg by mouth 2 (two) times daily. 11/06/20 11/06/21 Yes Fay Records, MD  atorvastatin (LIPITOR) 40 MG tablet Take 1 tablet by mouth daily. 02/10/21  Yes [name], MD  benzonatate (TESSALON) 100 MG capsule Take 1 capsule by mouth 3 times daily as needed for cough. 02/21/21  Yes Saguier, Percell Miller, PA-C  Cholecalciferol (VITAMIN D3 PO) Take 1 tablet by mouth in the morning. Unsure of how many units   Yes [provider]  cloNIDine (CATAPRES) 0.1 MG tablet TAKE 1 TABLET BY MOUTH 2 TIMES DAILY Patient taking differently: Take 0.1 mg by mouth 2 (two) times daily. 12/16/20  Yes Fay Records, MD  docusate sodium (COLACE) 100 MG capsule Take 300 mg by mouth 2 (two) times daily.   Yes [provider]  ferrous sulfate (FERROUSUL) 325 (65 FE) MG tablet Take 1 tablet (325 mg total) by mouth 3 (three) times daily with meals for 14 days. Patient taking differently: Take 325 mg by mouth in the morning and at bedtime. 11/24/18 11/07/22 Yes [name], MD  fluticasone (FLOVENT HFA) 110 MCG/ACT inhaler Inhale 2 puffs into the lungs 2 (two) times daily. Patient taking differently: Inhale 2 puffs into the lungs  2 (two) times daily as needed (respiratory issues.). 06/03/18  Yes Fay Records, MD  gabapentin (NEURONTIN) 300 MG capsule TAKE 1 CAPSULE BY MOUTH 3 TIMES DAILY Patient taking differently: Take 300 mg by mouth at bedtime. 12/23/20 12/23/21 Yes [name], MD  lisinopril (ZESTRIL) 2.5 MG tablet TAKE 1 TABLET BY MOUTH DAILY. Patient taking differently: Take 2.5 mg by mouth daily. 03/12/21  Yes [name], MD  metoCLOPramide (REGLAN) 5 MG tablet TAKE 1 TABLET BY MOUTH AT BEDTIME. Patient taking differently: Take 5 mg by mouth at bedtime. 12/09/20 12/09/21 Yes Gatha Mayer, MD  minoxidil (LONITEN) 10 MG tablet TAKE 1 TABLET  BY MOUTH TWICE DAILY Patient taking differently: Take 10 mg by mouth 2 (two) times daily. 08/30/20 08/30/21 Yes Fay Records, MD  ondansetron (ZOFRAN ODT) 8 MG disintegrating tablet Dissolve 1 tablet by mouth every 8 hours as needed for nausea or vomiting. 08/23/20  Yes [name], MD  pantoprazole (PROTONIX) 40 MG tablet TAKE 1 TABLET BY MOUTH ONCE A DAY BEFORE BREAKFAST Patient taking differently: Take 40 mg by mouth daily. 03/18/20 03/30/21 Yes Gatha Mayer, MD  polyvinyl alcohol (LIQUIFILM TEARS) 1.4 % ophthalmic solution Place 1 drop into both eyes 5 (five) times daily as needed for dry eyes.   Yes [provider]  sennosides-docusate sodium (SENOKOT-S) 8.6-50 MG tablet Take 1-2 tablets by mouth See admin instructions. Take 1 tablet in the morning & take 2 tablet at nigh   Yes [provider]  traMADol (ULTRAM) 50 MG tablet TAKE 1 TABLET BY MOUTH 3 TIMES DAILY AS NEEDED FOR MODERATE PAIN Patient taking differently: Take 50 mg by mouth 3 (three) times daily as needed for moderate pain. 03/12/21 09/08/21 Yes Wendling, Crosby Oyster, DO  triamcinolone cream (KENALOG) 0.5 % Apply 1 application topically daily as needed (for skin irritation).  01/22/12  Yes Janith Lima, MD  COVID-19 mRNA Vac-TriS, Pfizer, SUSP injection Inject into the muscle. 08/30/20    Carlyle Basques, MD  famotidine (PEPCID) 40 MG tablet Take 1 tablet (40 mg total) by mouth daily. Patient not taking: Reported on 03/14/2021 08/06/20   [name], MD  furosemide (LASIX) 40 MG tablet Take 1 tablet (40 mg) by mouth daily alternating with 1/2 tablet (20 mg) every other day Patient not taking: Reported on 03/14/2021 09/19/20   Fay Records, MD    Physical Exam: Constitutional: Moderately built and nourished. Vitals:   03/14/21 2051 03/14/21 2053 03/14/21 2053 03/14/21 2130  BP: (!) 184/59  (!) 200/58 (!) 185/54  Pulse: 99   70  Resp: 18   10  Temp: 98.3 F (36.8 C)     TempSrc: Oral     SpO2: 96%   93%  Weight:  66.2 kg    Height:  5\' 11"  (1.803 m)     Eyes: Anicteric no pallor. ENMT: No discharge from the ears eyes nose and mouth. Neck: No mass felt.  No neck rigidity. Respiratory: No rhonchi or crepitations. Cardiovascular: S1-S2 heard. Abdomen: Soft nontender bowel sounds present. Musculoskeletal: Pain on moving right hip. Skin: Chronic skin changes. Neurologic: Alert awake oriented to time place and person.  Moves all extremities. Psychiatric: Appears normal.  Normal affect.   Labs on Admission: I have personally reviewed following labs and imaging studies  CBC: Recent Labs  Lab 03/14/21 2048  WBC 6.8  NEUTROABS 5.1  HGB 12.0*  HCT 36.2*  MCV 90.0  PLT 242   Basic Metabolic Panel: Recent Labs  Lab 03/14/21 2048  NA 139  K 4.7  CL 106  CO2 27  GLUCOSE 173*  BUN 21  CREATININE 1.19  CALCIUM 8.8*   GFR: Estimated Creatinine Clearance: 39.4 mL/min (by C-G formula based on SCr of 1.19 mg/dL). Liver Function Tests: Recent Labs  Lab 03/14/21 2048  AST 16  ALT 8  ALKPHOS 61  BILITOT 0.3  PROT 6.3*  ALBUMIN 3.5   No results for input(s): LIPASE, AMYLASE in the last 168 hours. No results for input(s): AMMONIA in the last 168 hours. Coagulation Profile: No results for input(s): INR, PROTIME in the last 168 hours. Cardiac Enzymes: No  results for input(s): CKTOTAL, CKMB, CKMBINDEX, TROPONINI in the last 168 hours. BNP (last 3 results) Recent Labs    08/12/20 1601 08/27/20 1529 09/17/20 1559  PROBNP 156.0* 1,372* 1,107*   HbA1C: No results for input(s): HGBA1C in the last 72 hours. CBG: No results for input(s): GLUCAP in the last 168 hours. Lipid Profile: No results for input(s): CHOL, HDL, LDLCALC, TRIG, CHOLHDL, LDLDIRECT in the last 72 hours. Thyroid Function Tests: No results for input(s): TSH, T4TOTAL, FREET4, T3FREE, THYROIDAB in the last 72 hours. Anemia Panel: No results for input(s): VITAMINB12, FOLATE, FERRITIN, TIBC, IRON, RETICCTPCT in the last 72 hours. Urine analysis:    Component Value Date/Time   COLORURINE YELLOW 10/26/2018 0014   APPEARANCEUR CLEAR 10/26/2018 0014   LABSPEC 1.018 10/26/2018 0014   PHURINE 5.0 10/26/2018 0014   GLUCOSEU NEGATIVE 10/26/2018 0014   GLUCOSEU 100 (A) 12/01/2016 1858   HGBUR SMALL (A) 10/26/2018 0014   HGBUR 2+ 12/11/2008 1025   BILIRUBINUR NEGATIVE 10/26/2018 0014   BILIRUBINUR 1+ 02/23/2018 1641   KETONESUR NEGATIVE 10/26/2018 0014   PROTEINUR 100 (A) 10/26/2018 0014   UROBILINOGEN negative (A) 02/23/2018 1641   UROBILINOGEN 0.2 12/01/2016 1858   NITRITE NEGATIVE 10/26/2018 0014   LEUKOCYTESUR NEGATIVE 10/26/2018 0014   Sepsis Labs: @LABRCNTIP (procalcitonin:4,lacticidven:4) ) Recent Results (from the past 240 hour(s))  Resp Panel by RT-PCR (Flu A&B, Covid) Nasopharyngeal Swab     Status: None   Collection Time: 03/14/21  9:34 PM   Specimen: Nasopharyngeal Swab; Nasopharyngeal(NP) swabs in vial transport medium  Result Value Ref Range Status   SARS Coronavirus 2 by RT PCR NEGATIVE NEGATIVE Final    Comment: (NOTE) SARS-CoV-2 target nucleic acids are NOT DETECTED.  The SARS-CoV-2 RNA is generally detectable in upper respiratory specimens during the acute phase of infection. The lowest concentration of SARS-CoV-2 viral copies this assay can detect  is 138 copies/mL. A negative result does not preclude SARS-Cov-2 infection and should not be used as the sole basis for treatment or other patient management decisions. A negative result may occur with  improper specimen collection/handling, submission of specimen other than nasopharyngeal swab, presence of viral mutation(s) within the areas targeted by this assay, and inadequate number of viral copies(<138 copies/mL). A negative result must be combined with clinical observations, patient history, and epidemiological information. The expected result is Negative.  Fact Sheet for Patients:  EntrepreneurPulse.com.au  Fact Sheet for Healthcare Providers:  IncredibleEmployment.be  This test is no t yet approved or cleared by the [place] FDA and  has been authorized for detection and/or diagnosis of SARS-CoV-2 by FDA under an Emergency Use Authorization (EUA). This EUA will remain  in effect (meaning this test can be used) for the duration of the COVID-19 declaration under Section 564(b)(1) of the Act, 21 U.S.C.section 360bbb-3(b)(1), unless the authorization is terminated  or revoked sooner.       Influenza A by PCR NEGATIVE NEGATIVE Final   Influenza B by PCR NEGATIVE NEGATIVE Final    Comment: (NOTE) The Xpert Xpress SARS-CoV-2/FLU/RSV plus assay is intended as an aid in the diagnosis of influenza from Nasopharyngeal swab specimens and should not be used as a sole basis for treatment. Nasal washings and aspirates are unacceptable for Xpert Xpress SARS-CoV-2/FLU/RSV testing.  Fact Sheet for Patients: EntrepreneurPulse.com.au  Fact Sheet for Healthcare Providers: IncredibleEmployment.be  This test is not yet approved or cleared by the [place] FDA and has been authorized for detection and/or diagnosis of SARS-CoV-2 by FDA under an Emergency  Use Authorization (EUA). This EUA will remain in effect  (meaning this test can be used) for the duration of the COVID-19 declaration under Section 564(b)(1) of the Act, 21 U.S.C. section 360bbb-3(b)(1), unless the authorization is terminated or revoked.  Performed at [facility], [place] [address], [place], [place] [address]      Radiological Exams on Admission: CT Head Wo Contrast  Result Date: 03/14/2021 CLINICAL DATA:  Provided history: Head trauma, minor. Neck trauma. Additional history provided: Fall on blood thinners. EXAM: CT HEAD WITHOUT CONTRAST CT CERVICAL SPINE WITHOUT CONTRAST TECHNIQUE: Multidetector CT imaging of the head and cervical spine was performed following the standard protocol without intravenous contrast. Multiplanar CT image reconstructions of the cervical spine were also generated. RADIATION DOSE REDUCTION: This exam was performed according to the departmental dose-optimization program which includes automated exposure control, adjustment of the mA and/or kV according to patient size and/or use of iterative reconstruction technique. COMPARISON:  CT head/cervical spine 10/23/2018. FINDINGS: CT HEAD FINDINGS Brain: No age advanced or lobar predominant atrophy. Redemonstrated chronic cortical/subcortical infarct within the right occipital lobe. Mild patchy and ill-defined hypoattenuation elsewhere within the cerebral white matter, nonspecific but compatible with chronic small vessel ischemic disease. Redemonstrated chronic lacunar infarct in the right thalamus. Small focal hypodensity within the inferior left basal ganglia, likely reflecting a prominent perivascular space. Unchanged small chronic infarct within the left cerebellar hemisphere. There is no acute intracranial hemorrhage. No acute demarcated cortical infarct. No extra-axial fluid collection. No evidence of an intracranial mass. No midline shift. Vascular: No hyperdense vessel.  Atherosclerotic calcifications. Skull: Normal. Negative for fracture or focal lesion.  Sinuses/Orbits: Visualized orbits show no acute finding. As before, the right maxillary sinus is asymmetrically small as compared to the left. Mild mucosal thickening and fluid within the right maxillary sinus. Moderate fluid and mucosal thickening within the left maxillary sinus. Redemonstrated small chronic defect within the anterior wall of the left maxillary sinus. Both maxillary sinuses also demonstrate chronic reactive osteitis. CT CERVICAL SPINE FINDINGS Alignment: No significant spondylolisthesis. Skull base and vertebrae: The basion-dental and atlanto-dental intervals are maintained.No evidence of acute fracture to the cervical spine. Prior C4-C6 ACDF. No evidence of acute hardware compromise. Vertebral ankylosis at the operative levels. Soft tissues and spinal canal: No prevertebral fluid or swelling. No visible canal hematoma. Surgical clips within the right neck. Disc levels: Prior C4-C6 ACDF. No evidence of acute hardware compromise. Superimposed cervical spondylosis with multilevel disc bulges, endplate spurring, uncovertebral hypertrophy and facet arthrosis. Disc space narrowing is greatest at C6-C7 (advanced at this level). No appreciable high-grade spinal canal stenosis. Multilevel bony neural foraminal narrowing. Upper chest: No consolidation within the imaged lung apices. No visible pneumothorax. IMPRESSION: CT head: 1. No evidence of acute intracranial abnormality. 2. Redemonstrated chronic infarcts within the right thalamus and right occipital lobe. 3. Background mild chronic small vessel image changes within the cerebral white matter. 4. Redemonstrated small chronic infarct within the left cerebellar hemisphere. 5. Chronic bilateral maxillary sinusitis. CT cervical spine: 1. No evidence of acute fracture to the cervical spine. 2. Prior C4-C6 ACDF. No evidence of acute hardware compromise. Vertebral ankylosis at these levels. 3. Superimposed cervical spondylosis, as described. Electronically  Signed   By: [name] D.O.   On: 03/14/2021 21:27   CT Cervical Spine Wo Contrast  Result Date: 03/14/2021 CLINICAL DATA:  Provided history: Head trauma, minor. Neck trauma. Additional history provided: Fall on blood thinners. EXAM: CT HEAD WITHOUT CONTRAST CT CERVICAL SPINE WITHOUT  CONTRAST TECHNIQUE: Multidetector CT imaging of the head and cervical spine was performed following the standard protocol without intravenous contrast. Multiplanar CT image reconstructions of the cervical spine were also generated. RADIATION DOSE REDUCTION: This exam was performed according to the departmental dose-optimization program which includes automated exposure control, adjustment of the mA and/or kV according to patient size and/or use of iterative reconstruction technique. COMPARISON:  CT head/cervical spine 10/23/2018. FINDINGS: CT HEAD FINDINGS Brain: No age advanced or lobar predominant atrophy. Redemonstrated chronic cortical/subcortical infarct within the right occipital lobe. Mild patchy and ill-defined hypoattenuation elsewhere within the cerebral white matter, nonspecific but compatible with chronic small vessel ischemic disease. Redemonstrated chronic lacunar infarct in the right thalamus. Small focal hypodensity within the inferior left basal ganglia, likely reflecting a prominent perivascular space. Unchanged small chronic infarct within the left cerebellar hemisphere. There is no acute intracranial hemorrhage. No acute demarcated cortical infarct. No extra-axial fluid collection. No evidence of an intracranial mass. No midline shift. Vascular: No hyperdense vessel.  Atherosclerotic calcifications. Skull: Normal. Negative for fracture or focal lesion. Sinuses/Orbits: Visualized orbits show no acute finding. As before, the right maxillary sinus is asymmetrically small as compared to the left. Mild mucosal thickening and fluid within the right maxillary sinus. Moderate fluid and mucosal thickening within the  left maxillary sinus. Redemonstrated small chronic defect within the anterior wall of the left maxillary sinus. Both maxillary sinuses also demonstrate chronic reactive osteitis. CT CERVICAL SPINE FINDINGS Alignment: No significant spondylolisthesis. Skull base and vertebrae: The basion-dental and atlanto-dental intervals are maintained.No evidence of acute fracture to the cervical spine. Prior C4-C6 ACDF. No evidence of acute hardware compromise. Vertebral ankylosis at the operative levels. Soft tissues and spinal canal: No prevertebral fluid or swelling. No visible canal hematoma. Surgical clips within the right neck. Disc levels: Prior C4-C6 ACDF. No evidence of acute hardware compromise. Superimposed cervical spondylosis with multilevel disc bulges, endplate spurring, uncovertebral hypertrophy and facet arthrosis. Disc space narrowing is greatest at C6-C7 (advanced at this level). No appreciable high-grade spinal canal stenosis. Multilevel bony neural foraminal narrowing. Upper chest: No consolidation within the imaged lung apices. No visible pneumothorax. IMPRESSION: CT head: 1. No evidence of acute intracranial abnormality. 2. Redemonstrated chronic infarcts within the right thalamus and right occipital lobe. 3. Background mild chronic small vessel image changes within the cerebral white matter. 4. Redemonstrated small chronic infarct within the left cerebellar hemisphere. 5. Chronic bilateral maxillary sinusitis. CT cervical spine: 1. No evidence of acute fracture to the cervical spine. 2. Prior C4-C6 ACDF. No evidence of acute hardware compromise. Vertebral ankylosis at these levels. 3. Superimposed cervical spondylosis, as described. Electronically Signed   By: [name] D.O.   On: 03/14/2021 21:27   DG Pelvis Portable  Result Date: 03/14/2021 CLINICAL DATA:  Trauma, fall, right hip pain. EXAM: PORTABLE PELVIS 1-2 VIEWS COMPARISON:  Pelvis radiograph 10/24/2018 FINDINGS: Displaced right femoral neck  fracture. There is apex lateral angulation and slight migration of the femoral shaft. Underlying right hip osteoarthritis. Left hip arthroplasty is partially visualized, intact where included. Pubic rami are intact. Brachytherapy seeds or surgical clips in the prostate bed. IMPRESSION: Displaced and angulated right femoral neck fracture. Electronically Signed   By: Keith Rake M.D.   On: 03/14/2021 21:06   DG Chest Portable 1 View  Result Date: 03/14/2021 CLINICAL DATA:  Fall, trauma. EXAM: PORTABLE CHEST 1 VIEW COMPARISON:  Radiograph 02/05/2021, CT 07/09/2020 FINDINGS: Anti lordotic positioning, the patient is rotated. Chronic cardiomegaly. Aortic atherosclerosis. Unchanged blunting  of left costophrenic angle due to chronic pleural effusion. No pneumothorax. No acute airspace disease. No acute osseous abnormalities are seen on this limited portable exam. IMPRESSION: 1. No acute findings. 2. Chronic cardiomegaly and left pleural effusion. 3. Limited rotated exam. Electronically Signed   By: Keith Rake M.D.   On: 03/14/2021 21:04   DG Femur Min 2 Views Right  Result Date: 03/14/2021 CLINICAL DATA:  Fall, right hip pain. EXAM: RIGHT FEMUR 2 VIEWS COMPARISON:  None. FINDINGS: Displaced right femoral neck fracture with slight proximal migration of the femoral shaft. The distal femur is intact. Knee alignment is difficult to assess on provided views. Overlying artifacts. IMPRESSION: Displaced right femoral neck fracture. Electronically Signed   By: Keith Rake M.D.   On: 03/14/2021 21:07    EKG: Independently reviewed.  Normal sinus rhythm.  Assessment/Plan Principal Problem:   Hip fracture (HCC) Active Problems:   HLD (hyperlipidemia)   Essential hypertension   PAF (paroxysmal atrial fibrillation) (HCC)    Right hip fracture after mechanical fall.  Family has requested to consult with Dr. Lyla Glassing for surgery.  We will keep patient n.p.o. in anticipation of surgery.  Patient's Eliquis  dose last dose taken on March 14, 2021 in the morning. Paroxysmal atrial fibrillation presently in sinus rhythm holding Eliquis in anticipation of procedure family and patient agreeable. Hypertension uncontrolled likely from pain.  We will keep patient n.p.o. IV labetalol continue lisinopril amlodipine clonidine and minoxidil.  Follow blood pressure trends. History of recurrent pleural effusion has required multiple thoracentesis.  Chest x-ray shows persistence of the pleural effusion.  Patient not in distress. Anemia appears to be chronic follow CBC. Hyperlipidemia on statins.  Since patient has right hip fracture will need close monitoring and inpatient status.   DVT prophylaxis: SCDs.  Avoiding anticoagulation anticipation of procedure. Code Status: Full code. Family Communication: Patient's daughter and wife at the bedside. Disposition Plan: May need rehab. Consults called: Orthopedic surgery. Admission status: Inpatient.   Rise Patience MD [facility] Pager [phone].  If 7PM-7AM, please contact night-coverage [email or web address] Password [email or web address]  03/15/2021, 12:10 AM

## 2021-03-15 NOTE — Progress Notes (Signed)
Pt seen in room,alert/oriented in NAD. Surgical site to right hip CDI with JP drain to suction. Family at bedside, Pt oreintated to room/equipments, welcome guide/menu provided  with instructions.Pt/family verbalized understanding of instructions. Hospital valuables policy has been discussed with no complaints.  Bed in lowest position with 3 side rails up,call bell/room phone within reach, and all wheels locked. ?

## 2021-03-15 NOTE — ED Notes (Signed)
Admit provider at bedside 

## 2021-03-15 NOTE — Progress Notes (Signed)
PROGRESS NOTE  [name] EVO:[id number] DOB: [birth year] DOA: 03/14/2021 PCP: [name], MD  HPI/Recap of past 24 hours: [name] is a 88 y.o. male with history of A-fib on Eliquis, HTN, chronic anemia, history of recurrent left pleural effusion requiring multiple thoracentesis was brought to the ER after patient had a fall at a restaurant. Patient lost his balance and fell onto the floor. Denies losing consciousness. In the ED, CT head and C-spine were unremarkable. X-rays revealed right hip fracture. Orthopedic surgeon was consulted.  Patient admitted for further management.    Today, patient denies any new complaints, saw patient in the ER prior to scheduled surgery.  Right hip pain controlled with pain meds.  Denies any chest pain, shortness of breath, abdominal pain, nausea/vomiting, fever/chills.   Assessment/Plan: Principal Problem:   Hip fracture (HCC) Active Problems:   HLD (hyperlipidemia)   Essential hypertension   PAF (paroxysmal atrial fibrillation) (HCC)   Right hip fracture after mechanical fall Orthopedics consulted, plan for Hemi hip arthroplasty on 03/15/2021 DVT PPx, pain management as per orthopedics PT/OT post-surgery  Paroxysmal A-fib Rate controlled, normal sinus rhythm Not on any rate controlling meds Orthopedics to decide when to restart Eliquis  History of recurrent pleural effusion Currently on room air, with no increased work of breathing S/p multiple thoracentesis Recent chest x-ray shows persistence of pleural effusion  Chronic diastolic HF Appears euvolemic Continue meds as above  Hypertension Uncontrolled likely due to pain Continue amlodipine, clonidine, lisinopril, minoxidil  Hyperlipidemia Continue statins    Estimated body mass index is 20.37 kg/m as calculated from the following:   Height as of this encounter: 5' 10.98" (1.803 m).   Weight as of this encounter: 66.2 kg.     Code Status: Full  Family  Communication: None at bedside  Disposition Plan: Status is: Inpatient Remains inpatient appropriate because: Level of care     Consultants: Orthopedics  Procedures: Right Hemi hip arthroplasty  Antimicrobials: None  DVT prophylaxis: As per orthopedics     Objective: Vitals:   03/15/21 1109 03/15/21 1115 03/15/21 1119 03/15/21 1157  BP: (!) 149/50 (!) 162/51 (!) 158/73   Pulse: 84 85 80   Resp: 14 18 16    Temp:      TempSrc:      SpO2: 95% 95% 96%   Weight:    66.2 kg  Height:    5' 10.98" (1.803 m)    Intake/Output Summary (Last 24 hours) at 03/15/2021 1348 Last data filed at 03/15/2021 1330 Gross per 24 hour  Intake --  Output 150 ml  Net -150 ml   Filed Weights   03/14/21 2053 03/15/21 1157  Weight: 66.2 kg 66.2 kg    Exam: General: NAD  Cardiovascular: S1, S2 present Respiratory: CTAB Abdomen: Soft, nontender, nondistended, bowel sounds present Musculoskeletal: No bilateral pedal edema noted Skin: Chronic skin changes, noted multiple bruising Psychiatry: Normal mood     Data Reviewed: CBC: Recent Labs  Lab 03/14/21 2048  WBC 6.8  NEUTROABS 5.1  HGB 12.0*  HCT 36.2*  MCV 90.0  PLT 299   Basic Metabolic Panel: Recent Labs  Lab 03/14/21 2048  NA 139  K 4.7  CL 106  CO2 27  GLUCOSE 173*  BUN 21  CREATININE 1.19  CALCIUM 8.8*   GFR: Estimated Creatinine Clearance: 39.4 mL/min (by C-G formula based on SCr of 1.19 mg/dL). Liver Function Tests: Recent Labs  Lab 03/14/21 2048  AST 16  ALT 8  ALKPHOS 61  BILITOT 0.3  PROT 6.3*  ALBUMIN 3.5   No results for input(s): LIPASE, AMYLASE in the last 168 hours. No results for input(s): AMMONIA in the last 168 hours. Coagulation Profile: Recent Labs  Lab 03/15/21 0534  INR 1.3*   Cardiac Enzymes: No results for input(s): CKTOTAL, CKMB, CKMBINDEX, TROPONINI in the last 168 hours. BNP (last 3 results) Recent Labs    08/12/20 1601 08/27/20 1529 09/17/20 1559  PROBNP 156.0*  1,372* 1,107*   HbA1C: No results for input(s): HGBA1C in the last 72 hours. CBG: Recent Labs  Lab 03/15/21 0758  GLUCAP 140*   Lipid Profile: No results for input(s): CHOL, HDL, LDLCALC, TRIG, CHOLHDL, LDLDIRECT in the last 72 hours. Thyroid Function Tests: No results for input(s): TSH, T4TOTAL, FREET4, T3FREE, THYROIDAB in the last 72 hours. Anemia Panel: No results for input(s): VITAMINB12, FOLATE, FERRITIN, TIBC, IRON, RETICCTPCT in the last 72 hours. Urine analysis:    Component Value Date/Time   COLORURINE YELLOW 10/26/2018 0014   APPEARANCEUR CLEAR 10/26/2018 0014   LABSPEC 1.018 10/26/2018 0014   PHURINE 5.0 10/26/2018 0014   GLUCOSEU NEGATIVE 10/26/2018 0014   GLUCOSEU 100 (A) 12/01/2016 1858   HGBUR SMALL (A) 10/26/2018 0014   HGBUR 2+ 12/11/2008 1025   BILIRUBINUR NEGATIVE 10/26/2018 0014   BILIRUBINUR 1+ 02/23/2018 1641   KETONESUR NEGATIVE 10/26/2018 0014   PROTEINUR 100 (A) 10/26/2018 0014   UROBILINOGEN negative (A) 02/23/2018 1641   UROBILINOGEN 0.2 12/01/2016 1858   NITRITE NEGATIVE 10/26/2018 0014   LEUKOCYTESUR NEGATIVE 10/26/2018 0014   Sepsis Labs: @LABRCNTIP (procalcitonin:4,lacticidven:4)  ) Recent Results (from the past 240 hour(s))  Resp Panel by RT-PCR (Flu A&B, Covid) Nasopharyngeal Swab     Status: None   Collection Time: 03/14/21  9:34 PM   Specimen: Nasopharyngeal Swab; Nasopharyngeal(NP) swabs in vial transport medium  Result Value Ref Range Status   SARS Coronavirus 2 by RT PCR NEGATIVE NEGATIVE Final    Comment: (NOTE) SARS-CoV-2 target nucleic acids are NOT DETECTED.  The SARS-CoV-2 RNA is generally detectable in upper respiratory specimens during the acute phase of infection. The lowest concentration of SARS-CoV-2 viral copies this assay can detect is 138 copies/mL. A negative result does not preclude SARS-Cov-2 infection and should not be used as the sole basis for treatment or other patient management decisions. A negative  result may occur with  improper specimen collection/handling, submission of specimen other than nasopharyngeal swab, presence of viral mutation(s) within the areas targeted by this assay, and inadequate number of viral copies(<138 copies/mL). A negative result must be combined with clinical observations, patient history, and epidemiological information. The expected result is Negative.  Fact Sheet for Patients:  EntrepreneurPulse.com.au  Fact Sheet for Healthcare Providers:  IncredibleEmployment.be  This test is no t yet approved or cleared by the [place] FDA and  has been authorized for detection and/or diagnosis of SARS-CoV-2 by FDA under an Emergency Use Authorization (EUA). This EUA will remain  in effect (meaning this test can be used) for the duration of the COVID-19 declaration under Section 564(b)(1) of the Act, 21 U.S.C.section 360bbb-3(b)(1), unless the authorization is terminated  or revoked sooner.       Influenza A by PCR NEGATIVE NEGATIVE Final   Influenza B by PCR NEGATIVE NEGATIVE Final    Comment: (NOTE) The Xpert Xpress SARS-CoV-2/FLU/RSV plus assay is intended as an aid in the diagnosis of influenza from Nasopharyngeal swab specimens and should not be used as a sole basis for treatment.  Nasal washings and aspirates are unacceptable for Xpert Xpress SARS-CoV-2/FLU/RSV testing.  Fact Sheet for Patients: EntrepreneurPulse.com.au  Fact Sheet for Healthcare Providers: IncredibleEmployment.be  This test is not yet approved or cleared by the [place] FDA and has been authorized for detection and/or diagnosis of SARS-CoV-2 by FDA under an Emergency Use Authorization (EUA). This EUA will remain in effect (meaning this test can be used) for the duration of the COVID-19 declaration under Section 564(b)(1) of the Act, 21 U.S.C. section 360bbb-3(b)(1), unless the authorization is  terminated or revoked.  Performed at [facility], [place] [address], [place], [place] [address]       Studies: CT Head Wo Contrast  Result Date: 03/14/2021 CLINICAL DATA:  Provided history: Head trauma, minor. Neck trauma. Additional history provided: Fall on blood thinners. EXAM: CT HEAD WITHOUT CONTRAST CT CERVICAL SPINE WITHOUT CONTRAST TECHNIQUE: Multidetector CT imaging of the head and cervical spine was performed following the standard protocol without intravenous contrast. Multiplanar CT image reconstructions of the cervical spine were also generated. RADIATION DOSE REDUCTION: This exam was performed according to the departmental dose-optimization program which includes automated exposure control, adjustment of the mA and/or kV according to patient size and/or use of iterative reconstruction technique. COMPARISON:  CT head/cervical spine 10/23/2018. FINDINGS: CT HEAD FINDINGS Brain: No age advanced or lobar predominant atrophy. Redemonstrated chronic cortical/subcortical infarct within the right occipital lobe. Mild patchy and ill-defined hypoattenuation elsewhere within the cerebral white matter, nonspecific but compatible with chronic small vessel ischemic disease. Redemonstrated chronic lacunar infarct in the right thalamus. Small focal hypodensity within the inferior left basal ganglia, likely reflecting a prominent perivascular space. Unchanged small chronic infarct within the left cerebellar hemisphere. There is no acute intracranial hemorrhage. No acute demarcated cortical infarct. No extra-axial fluid collection. No evidence of an intracranial mass. No midline shift. Vascular: No hyperdense vessel.  Atherosclerotic calcifications. Skull: Normal. Negative for fracture or focal lesion. Sinuses/Orbits: Visualized orbits show no acute finding. As before, the right maxillary sinus is asymmetrically small as compared to the left. Mild mucosal thickening and fluid within the right maxillary  sinus. Moderate fluid and mucosal thickening within the left maxillary sinus. Redemonstrated small chronic defect within the anterior wall of the left maxillary sinus. Both maxillary sinuses also demonstrate chronic reactive osteitis. CT CERVICAL SPINE FINDINGS Alignment: No significant spondylolisthesis. Skull base and vertebrae: The basion-dental and atlanto-dental intervals are maintained.No evidence of acute fracture to the cervical spine. Prior C4-C6 ACDF. No evidence of acute hardware compromise. Vertebral ankylosis at the operative levels. Soft tissues and spinal canal: No prevertebral fluid or swelling. No visible canal hematoma. Surgical clips within the right neck. Disc levels: Prior C4-C6 ACDF. No evidence of acute hardware compromise. Superimposed cervical spondylosis with multilevel disc bulges, endplate spurring, uncovertebral hypertrophy and facet arthrosis. Disc space narrowing is greatest at C6-C7 (advanced at this level). No appreciable high-grade spinal canal stenosis. Multilevel bony neural foraminal narrowing. Upper chest: No consolidation within the imaged lung apices. No visible pneumothorax. IMPRESSION: CT head: 1. No evidence of acute intracranial abnormality. 2. Redemonstrated chronic infarcts within the right thalamus and right occipital lobe. 3. Background mild chronic small vessel image changes within the cerebral white matter. 4. Redemonstrated small chronic infarct within the left cerebellar hemisphere. 5. Chronic bilateral maxillary sinusitis. CT cervical spine: 1. No evidence of acute fracture to the cervical spine. 2. Prior C4-C6 ACDF. No evidence of acute hardware compromise. Vertebral ankylosis at these levels. 3. Superimposed cervical spondylosis, as described. Electronically  Signed   By: [name] D.O.   On: 03/14/2021 21:27   CT Cervical Spine Wo Contrast  Result Date: 03/14/2021 CLINICAL DATA:  Provided history: Head trauma, minor. Neck trauma. Additional history  provided: Fall on blood thinners. EXAM: CT HEAD WITHOUT CONTRAST CT CERVICAL SPINE WITHOUT CONTRAST TECHNIQUE: Multidetector CT imaging of the head and cervical spine was performed following the standard protocol without intravenous contrast. Multiplanar CT image reconstructions of the cervical spine were also generated. RADIATION DOSE REDUCTION: This exam was performed according to the departmental dose-optimization program which includes automated exposure control, adjustment of the mA and/or kV according to patient size and/or use of iterative reconstruction technique. COMPARISON:  CT head/cervical spine 10/23/2018. FINDINGS: CT HEAD FINDINGS Brain: No age advanced or lobar predominant atrophy. Redemonstrated chronic cortical/subcortical infarct within the right occipital lobe. Mild patchy and ill-defined hypoattenuation elsewhere within the cerebral white matter, nonspecific but compatible with chronic small vessel ischemic disease. Redemonstrated chronic lacunar infarct in the right thalamus. Small focal hypodensity within the inferior left basal ganglia, likely reflecting a prominent perivascular space. Unchanged small chronic infarct within the left cerebellar hemisphere. There is no acute intracranial hemorrhage. No acute demarcated cortical infarct. No extra-axial fluid collection. No evidence of an intracranial mass. No midline shift. Vascular: No hyperdense vessel.  Atherosclerotic calcifications. Skull: Normal. Negative for fracture or focal lesion. Sinuses/Orbits: Visualized orbits show no acute finding. As before, the right maxillary sinus is asymmetrically small as compared to the left. Mild mucosal thickening and fluid within the right maxillary sinus. Moderate fluid and mucosal thickening within the left maxillary sinus. Redemonstrated small chronic defect within the anterior wall of the left maxillary sinus. Both maxillary sinuses also demonstrate chronic reactive osteitis. CT CERVICAL SPINE  FINDINGS Alignment: No significant spondylolisthesis. Skull base and vertebrae: The basion-dental and atlanto-dental intervals are maintained.No evidence of acute fracture to the cervical spine. Prior C4-C6 ACDF. No evidence of acute hardware compromise. Vertebral ankylosis at the operative levels. Soft tissues and spinal canal: No prevertebral fluid or swelling. No visible canal hematoma. Surgical clips within the right neck. Disc levels: Prior C4-C6 ACDF. No evidence of acute hardware compromise. Superimposed cervical spondylosis with multilevel disc bulges, endplate spurring, uncovertebral hypertrophy and facet arthrosis. Disc space narrowing is greatest at C6-C7 (advanced at this level). No appreciable high-grade spinal canal stenosis. Multilevel bony neural foraminal narrowing. Upper chest: No consolidation within the imaged lung apices. No visible pneumothorax. IMPRESSION: CT head: 1. No evidence of acute intracranial abnormality. 2. Redemonstrated chronic infarcts within the right thalamus and right occipital lobe. 3. Background mild chronic small vessel image changes within the cerebral white matter. 4. Redemonstrated small chronic infarct within the left cerebellar hemisphere. 5. Chronic bilateral maxillary sinusitis. CT cervical spine: 1. No evidence of acute fracture to the cervical spine. 2. Prior C4-C6 ACDF. No evidence of acute hardware compromise. Vertebral ankylosis at these levels. 3. Superimposed cervical spondylosis, as described. Electronically Signed   By: [name] D.O.   On: 03/14/2021 21:27   DG Pelvis Portable  Result Date: 03/14/2021 CLINICAL DATA:  Trauma, fall, right hip pain. EXAM: PORTABLE PELVIS 1-2 VIEWS COMPARISON:  Pelvis radiograph 10/24/2018 FINDINGS: Displaced right femoral neck fracture. There is apex lateral angulation and slight migration of the femoral shaft. Underlying right hip osteoarthritis. Left hip arthroplasty is partially visualized, intact where included. Pubic  rami are intact. Brachytherapy seeds or surgical clips in the prostate bed. IMPRESSION: Displaced and angulated right femoral neck fracture. Electronically Signed  By: Keith Rake M.D.   On: 03/14/2021 21:06   DG Chest Portable 1 View  Result Date: 03/14/2021 CLINICAL DATA:  Fall, trauma. EXAM: PORTABLE CHEST 1 VIEW COMPARISON:  Radiograph 02/05/2021, CT 07/09/2020 FINDINGS: Anti lordotic positioning, the patient is rotated. Chronic cardiomegaly. Aortic atherosclerosis. Unchanged blunting of left costophrenic angle due to chronic pleural effusion. No pneumothorax. No acute airspace disease. No acute osseous abnormalities are seen on this limited portable exam. IMPRESSION: 1. No acute findings. 2. Chronic cardiomegaly and left pleural effusion. 3. Limited rotated exam. Electronically Signed   By: Keith Rake M.D.   On: 03/14/2021 21:04   DG Femur Min 2 Views Right  Result Date: 03/14/2021 CLINICAL DATA:  Fall, right hip pain. EXAM: RIGHT FEMUR 2 VIEWS COMPARISON:  None. FINDINGS: Displaced right femoral neck fracture with slight proximal migration of the femoral shaft. The distal femur is intact. Knee alignment is difficult to assess on provided views. Overlying artifacts. IMPRESSION: Displaced right femoral neck fracture. Electronically Signed   By: Keith Rake M.D.   On: 03/14/2021 21:07   DG FEMUR PORT MIN 2 VIEWS LEFT  Result Date: 03/15/2021 CLINICAL DATA:  88 year old male with fall. Left hip arthroplasty. EXAM: LEFT FEMUR PORTABLE 2 VIEWS COMPARISON:  04/28/2019. FINDINGS: Chronic proximal left femoral arthroplasty. Femoral head component remains normally aligned. Hardware appears intact. Visible left hemipelvis appears intact. Chronic prostate brachytherapy. Proximal left femur appears stable and intact. Left femoral shaft intact. Distal left femur intact. Preserved alignment at the left knee. No evidence of knee joint effusion. Multi compartment knee joint space loss. Calcified  peripheral vascular disease. No acute osseous abnormality identified. IMPRESSION: 1. No acute fracture or dislocation identified about the left femur. 2. Stable chronic proximal left femoral arthroplasty. Electronically Signed   By: Genevie Ann M.D.   On: 03/15/2021 08:47    Scheduled Meds:  acetaminophen  1,000 mg Oral Once   [MAR Hold] amLODipine  10 mg Oral Daily   [MAR Hold] atorvastatin  40 mg Oral Daily   chlorhexidine  60 mL Topical Once   chlorhexidine  15 mL Mouth/Throat Once   Or   mouth rinse  15 mL Mouth Rinse Once   chlorhexidine       [MAR Hold] cloNIDine  0.1 mg Oral BID   [MAR Hold] docusate sodium  300 mg Oral BID   fentaNYL       [MAR Hold] gabapentin  300 mg Oral QHS   [MAR Hold] lisinopril  2.5 mg Oral Daily   [MAR Hold] minoxidil  10 mg Oral BID   povidone-iodine  2 application Topical Once   [MAR Hold] senna-docusate  1 tablet Oral Daily   And   [MAR Hold] senna-docusate  2 tablet Oral QHS    Continuous Infusions:  acetaminophen     lactated ringers     [MAR Hold] methocarbamol (ROBAXIN) IV       LOS: 1 day     Alma Friendly, MD [facility]  If 7PM-7AM, please contact night-coverage [email or web address] 03/15/2021, 1:48 PM

## 2021-03-15 NOTE — Anesthesia Procedure Notes (Addendum)
Anesthesia Regional Block: Peng block  ? ?Pre-Anesthetic Checklist: , timeout performed,  Correct Patient, Correct Site, Correct Laterality,  Correct Procedure, Correct Position, site marked,  Risks and benefits discussed,  Surgical consent,  Pre-op evaluation,  At surgeon's request and post-op pain management ? ?Laterality: Right ? ?Prep: Dura Prep     ?  ?Needles:  ?Injection technique: Single-shot ? ?Needle Type: Echogenic Stimulator Needle   ? ? ?Needle Length: 10cm  ?Needle Gauge: 20  ? ? ? ?Additional Needles: ? ? ?Procedures:,,,, ultrasound used (permanent image in chart),,    ?Narrative:  ?Start time: 03/15/2021 10:53 AM ?End time: 03/15/2021 11:00 AM ?Injection made incrementally with aspirations every 5 mL. ? ?Performed by: Personally  ?Anesthesiologist: Darral Dash, DO ? ?Additional Notes: ?Patient identified. Risks/Benefits/Options discussed with patient including but not limited to bleeding, infection, nerve damage, failed block, incomplete pain control. Patient expressed understanding and wished to proceed. All questions were answered. Sterile technique was used throughout the entire procedure. Please see nursing notes for vital signs. Aspirated in 5cc intervals with injection for negative confirmation. Patient was given instructions on fall risk and not to get out of bed. All questions and concerns addressed with instructions to call with any issues or inadequate analgesia.   ?  ? ? ? ? ?

## 2021-03-16 DIAGNOSIS — I10 Essential (primary) hypertension: Secondary | ICD-10-CM | POA: Diagnosis not present

## 2021-03-16 DIAGNOSIS — I480 Paroxysmal atrial fibrillation: Secondary | ICD-10-CM | POA: Diagnosis not present

## 2021-03-16 DIAGNOSIS — S72001A Fracture of unspecified part of neck of right femur, initial encounter for closed fracture: Secondary | ICD-10-CM | POA: Diagnosis not present

## 2021-03-16 DIAGNOSIS — E782 Mixed hyperlipidemia: Secondary | ICD-10-CM | POA: Diagnosis not present

## 2021-03-16 LAB — CBC WITH DIFFERENTIAL/PLATELET
Abs Immature Granulocytes: 0.06 10*3/uL (ref 0.00–0.07)
Basophils Absolute: 0 10*3/uL (ref 0.0–0.1)
Basophils Relative: 0 %
Eosinophils Absolute: 0 10*3/uL (ref 0.0–0.5)
Eosinophils Relative: 0 %
HCT: 28.7 % — ABNORMAL LOW (ref 39.0–52.0)
Hemoglobin: 9.4 g/dL — ABNORMAL LOW (ref 13.0–17.0)
Immature Granulocytes: 0 %
Lymphocytes Relative: 5 %
Lymphs Abs: 0.6 10*3/uL — ABNORMAL LOW (ref 0.7–4.0)
MCH: 29.5 pg (ref 26.0–34.0)
MCHC: 32.8 g/dL (ref 30.0–36.0)
MCV: 90 fL (ref 80.0–100.0)
Monocytes Absolute: 0.9 10*3/uL (ref 0.1–1.0)
Monocytes Relative: 6 %
Neutro Abs: 12.3 10*3/uL — ABNORMAL HIGH (ref 1.7–7.7)
Neutrophils Relative %: 89 %
Platelets: 207 10*3/uL (ref 150–400)
RBC: 3.19 MIL/uL — ABNORMAL LOW (ref 4.22–5.81)
RDW: 14.2 % (ref 11.5–15.5)
WBC: 13.9 10*3/uL — ABNORMAL HIGH (ref 4.0–10.5)
nRBC: 0 % (ref 0.0–0.2)

## 2021-03-16 MED ORDER — ZINC OXIDE 12.8 % EX OINT
TOPICAL_OINTMENT | CUTANEOUS | Status: DC
  Filled 2021-03-16: qty 56.7

## 2021-03-16 NOTE — Progress Notes (Signed)
Inpatient Rehab Admissions Coordinator:  ? ?Per PT recommendation,  patient was screened for CIR candidacy by Clemens Catholic, MS, CCC-SLP. At this time, Pt. Appears to be a a potential candidate for CIR. I will place order for rehab consult per protocol for full assessment. Please contact me any with questions. ? ?Clemens Catholic, MS, CCC-SLP ?Rehab Admissions Coordinator  ?[phone] (celll) ?[phone] (office) ? ?

## 2021-03-16 NOTE — Consult Note (Signed)
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 Family said it has been there about 2 months, so PTA ?Wound type: pressure vs moisture vs idiopathic ?Pressure Injury POA: Yes ?Measurement:1cm round x 0.2cm depth ?Wound NKN:[id number] pink ?Drainage (amount, consistency, odor) none ?Periwound: appearance of epidermal "ridge or rim" from 12-3 o'clock ?Dressing procedure/placement/frequency: I will add a 6 times daily application of zinc oxide to this areas in an attempt to dry and reseal it. A pressure redistribution chair pad has been applied to the chair and patient will take home with him upon discharge. Turning from side to side while in bed and minimizing time in the supine position is advised. ? ?[place] nursing team will not follow, but will remain available to this patient, the nursing and medical teams.  Please re-consult if needed. ?Thanks, ?Maudie Flakes, MSN, RN, [place], [place], CWON-AP, [place]  ?Pager# [phone]  ? ? ? ?  ?

## 2021-03-16 NOTE — Evaluation (Signed)
Occupational Therapy Evaluation ?Patient Details ?Name: [name] ?MRN: [id number] ?DOB: 02/02/1929 ?Today's Date: 03/16/2021 ? ? 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  ? ?Clinical Impression ?  ?Pt admitted for concerns listed above. PTA pt reported that he was independent with all ADL's and IADL's, including assisting his wife since her TKR last fall. At this time, pt is limited by pain and weakness. He is highly motivated to get back to his independence. He is requiring mod A for transfers at this time and set up-mod A for all ADL's. Prior to his fall, pt did not use any DME, however at this time for balance and safety, he is fully reliant on a RW. Recommending AIR to maximize his independence and safety. OT will follow acutely.   ?   ? ?Recommendations for follow up therapy are one component of a multi-disciplinary discharge planning process, led by the attending physician.  Recommendations may be updated based on patient status, additional functional criteria and insurance authorization.  ? ?Follow Up Recommendations ? Acute inpatient rehab (3hours/day)  ?  ?Assistance Recommended at Discharge Frequent or constant Supervision/Assistance  ?Patient can return home with the following A lot of help with walking and/or transfers;A lot of help with bathing/dressing/bathroom ? ?  ?Functional Status Assessment ? Patient has had a recent decline in their functional  status and demonstrates the ability to make significant improvements in function in a reasonable and predictable amount of time.  ?Equipment Recommendations ? Other (comment) (TBD)  ?  ?Recommendations for Other Services Rehab consult ? ? ?  ?Precautions / Restrictions Precautions ?Precautions: Fall ?Precaution Comments: JP drain ?Restrictions ?Weight Bearing Restrictions: Yes ?RLE Weight Bearing: Weight bearing as tolerated  ? ?  ? ?Mobility Bed Mobility ?Overal bed mobility: Needs Assistance ?  ?  ?  ?  ?  ?  ?General bed mobility comments: Pt up in recliner on entry ?  ? ?Transfers ?Overall transfer level: Needs assistance ?Equipment used: Rolling walker (2 wheels) ?Transfers: Sit to/from Stand ?Sit to Stand: Mod assist ?  ?  ?  ?  ?  ?General transfer comment: Light Mod assist to power up to stand; cues for hand placement ?  ? ?  ?Balance Overall balance assessment: Needs assistance ?Sitting-balance support: Feet supported ?Sitting balance-Leahy Scale: Fair ?  ?  ?Standing balance support: Reliant on assistive device for balance ?Standing balance-Leahy Scale: Poor ?  ?  ?  ?  ?  ?  ?  ?  ?  ?  ?  ?  ?   ? ?ADL either performed or assessed with clinical judgement  ? ?ADL Overall ADL's : Needs assistance/impaired ?  ?  ?  ?  ?  ?  ?  ?  ?  ?  ?  ?  ?  ?  ?  ?  ?  ?  ?  ?General ADL Comments: Overall, pt able to complete UB ADL's and grooming/self feeding with set up while seated. LB ADL's at this time require min-mod A due to hip sx precautions and pain.  ? ? ? ?  Vision Baseline Vision/History: 1 Wears glasses ?Ability to See in Adequate Light: 0 Adequate ?Patient Visual Report: No change from baseline ?Vision Assessment?: No apparent visual deficits  ?   ?Perception   ?  ?Praxis   ?  ? ?Pertinent Vitals/Pain Pain Assessment ?Pain Assessment: Faces ?Faces Pain Scale: Hurts little more ?Pain Location: RLE ?Pain Descriptors / Indicators: Aching, Grimacing, Guarding, Operative site guarding ?Pain Intervention(s):  Monitored during session, Repositioned  ? ? ? ?Hand Dominance Right ?  ?Extremity/Trunk Assessment Upper Extremity Assessment ?Upper Extremity Assessment: Overall WFL for tasks assessed ?  ?Lower Extremity Assessment ?Lower Extremity Assessment: Defer to PT evaluation ?  ?Cervical / Trunk Assessment ?Cervical / Trunk Assessment: Kyphotic ?  ?Communication Communication ?Communication: HOH ?  ?Cognition Arousal/Alertness: Awake/alert ?Behavior During Therapy: [facility] for tasks assessed/performed ?Overall Cognitive Status: Within Functional Limits for tasks assessed ?  ?  ?  ?  ?  ?  ?  ?  ?  ?  ?  ?  ?  ?  ?  ?  ?  ?  ?  ?General Comments  VSS on RA, O2 remaining 98%+ ? ?  ?Exercises   ?  ?Shoulder Instructions    ? ? ?Home Living Family/patient expects to be discharged to:: Private residence ?Living Arrangements: Spouse/significant other ?Available Help at Discharge: Family ?Type of Home: House ?Home Access: Stairs to enter ?Entrance Stairs-Number of Steps: 4-5 ?Entrance Stairs-Rails: Left ?Home Layout: One level ?  ?  ?Bathroom Shower/Tub: Walk-in shower ?  ?Bathroom Toilet: Handicapped height ?Bathroom Accessibility: No ?  ?Home Equipment: Agricultural consultant (2 wheels);Shower seat;BSC/3in1 ?  ?Additional Comments: Gives his wife assistance -- she had a TKR in Nov 22 ?  ? ?  ?Prior Functioning/Environment Prior Level of Function : Independent/Modified Independent;Driving ?  ?  ?  ?  ?  ?  ?Mobility Comments: No need for assistive device ?ADLs Comments: Reports independence ?  ? ?  ?  ?OT Problem List: Decreased strength;Decreased activity tolerance;Impaired balance (sitting and/or standing);Decreased safety awareness;Decreased knowledge of use of DME or AE;Pain ?  ?   ?OT Treatment/Interventions: Self-care/ADL training;Therapeutic exercise;Energy conservation;DME and/or AE instruction;Therapeutic activities;Patient/family education;Balance training  ?  ?OT Goals(Current goals can be found in the care plan section) Acute  Rehab OT Goals ?Patient Stated Goal: To get better and go home ?OT Goal Formulation: With patient ?Time For Goal Achievement: 03/30/21 ?Potential to Achieve Goals: Good ?ADL Goals ?Pt Will Perform Grooming: with modified independence;standing ?Pt Will Perform Lower Body Bathing: with modified independence;with adaptive equipment;sitting/lateral leans;sit to/from stand ?Pt Will Perform Lower Body Dressing: with modified independence;sitting/lateral leans;sit to/from stand;with adaptive equipment ?Pt Will Transfer to Toilet: with modified independence;ambulating ?Pt Will Perform Toileting - Clothing Manipulation and hygiene: with modified independence;with adaptive equipment;sitting/lateral leans;sit to/from stand  ?OT Frequency: Min 2X/week ?  ? ?Co-evaluation   ?  ?  ?  ?  ? ?  ?AM-PAC OT "6 Clicks" Daily Activity     ?Outcome Measure Help from another person eating meals?: A Little ?Help from another person taking care of personal grooming?: A Little ?Help from another person toileting, which includes using toliet, bedpan, or urinal?: A Lot ?Help from another person bathing (including washing, rinsing, drying)?: A Lot ?Help from another person to put on and taking off regular upper body clothing?: A Little ?Help from another person to put on and taking off regular lower body clothing?: A Lot ?6 Click Score: 15 ?  ?End of Session Equipment Utilized During Treatment:  Rolling walker (2 wheels);Gait belt ?Nurse Communication: Mobility status ? ?Activity Tolerance: Patient tolerated treatment well ?Patient left: in chair;with call bell/phone within reach ? ?OT Visit Diagnosis: Unsteadiness on feet (R26.81);Other abnormalities of gait and mobility (R26.89);Muscle weakness (generalized) (M62.81)  ?              ?Time: 8657-8469 ?OT Time Calculation (min): 17 min ?Charges:  OT General Charges ?$OT Visit: 1 Visit ?OT Evaluation ?$OT Eval Moderate Complexity: 1 Mod ? ?Amariyah Bazar H., OTR/L ?Acute Rehabilitation ? ?Marleni Gallardo Elane  [place] ?03/16/2021, 4:44 PM ?

## 2021-03-16 NOTE — Evaluation (Signed)
Physical Therapy Evaluation Patient Details Name: [name] MRN: [id number] DOB: 08-30-1929 Today's Date: 03/16/2021  History of Present Illness  88 year old admitted after fall at a restaurant resulting in R hip fx; now s/p THA for fracture fixation, direct anterior, WBAT; has a past medical history of Acute bronchitis (06/10/2015), Allergic rhinitis, Anemia, Asthma, Carotid stenosis, Chronic pain syndrome, Diverticulosis, Dyslipidemia, Epigastric pain (04/05/2016), Esophageal stricture, GERD (gastroesophageal reflux disease), Hemorrhoids, HTN (hypertension), echocardiogram, Pleural effusion (07/17/2020), Prostate cancer ([place]) (2000), Spondylosis, cervical, 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  Clinical Impression   Patient is s/p above surgery resulting in functional limitations due to the deficits listed below (see PT Problem List). Lives at home with wife (who he cares for), in a single-level home with a few steps to enter; Prior to admission, pt was Independent, driving; Presents to PT with decr functional mobility, decr strength, need for assistive device with standing and ambulating;  Pt currently with functional limitations due to the deficits listed below (see PT Problem List). Pt will benefit from skilled PT to increase their independence and safety with mobility to allow discharge to the venue listed below.    Mr. Scheffler had a successful rehab course at our Acute Inpatient Rehab center in 2020, and he hopes to be able to return for post-acute rehab to maximize independence and safety with mobility and ADLs here now; he is older than 88 yo, which contributes to the medical complexity that qualifies for AIR; He has supportive adult daughters; He is motivated, and was independent prior to this fall; I'm happy to endorse Acute Inpt Rehab for him when ready to leave the acute floor.         Recommendations for follow up therapy are one component of a multi-disciplinary discharge  planning process, led by the attending physician.  Recommendations may be updated based on patient status, additional functional criteria and insurance authorization.  Follow Up Recommendations Acute inpatient rehab (3hours/day)    Assistance Recommended at Discharge Set up Supervision/Assistance  Patient can return home with the following  A little help with walking and/or transfers;A little help with bathing/dressing/bathroom;Assistance with cooking/housework;Help with stairs or ramp for entrance    Equipment Recommendations Other (comment) (Pretty well-equipped)  Recommendations for Other Services  OT consult;Rehab consult (as ordered)    Functional Status Assessment Patient has had a recent decline in their functional status and demonstrates the ability to make significant improvements in function in a reasonable and predictable amount of time.     Precautions / Restrictions Precautions Precautions: Fall Precaution Comments: JP drain Restrictions RLE Weight Bearing: Weight bearing as tolerated      Mobility  Bed Mobility Overal bed mobility: Needs Assistance Bed Mobility: Supine to Sit     Supine to sit: Mod assist     General bed mobility comments: Good initiation; Assist to help RLE off of the bed and to square off hips at EOB    Transfers Overall transfer level: Needs assistance Equipment used: Rolling walker (2 wheels) Transfers: Sit to/from Stand Sit to Stand: Mod assist           General transfer comment: Light Mod assist to power up to stand; cues for hand placement    Ambulation/Gait Ambulation/Gait assistance: Min assist Gait Distance (Feet): 8 Feet Assistive device: Rolling walker (2 wheels) Gait Pattern/deviations: Step-to pattern, Trunk flexed       General Gait Details: Short, inefficient steps that tended to be quick despite cues to slow down  and lengthen steps; Good use of RW for supoprt  Stairs            Wheelchair Mobility     Modified Rankin (Stroke Patients Only)       Balance Overall balance assessment: Needs assistance   Sitting balance-Leahy Scale: Fair       Standing balance-Leahy Scale: Poor                               Pertinent Vitals/Pain Pain Assessment Pain Assessment: 0-10 Pain Score: 5  Pain Location: R HIp Pain Descriptors / Indicators: Aching, Grimacing, Guarding, Operative site guarding Pain Intervention(s): Monitored during session    Home Living Family/patient expects to be discharged to:: Private residence Living Arrangements: Spouse/significant other   Type of Home: House Home Access: Stairs to enter Entrance Stairs-Rails: Left Entrance Stairs-Number of Steps: 4-5   Home Layout: One level Home Equipment: Conservation officer, nature (2 wheels);Shower seat;BSC/3in1 Additional Comments: Gives his wife assistance -- she had a TKR in Nov 22    Prior Function Prior Level of Function : Independent/Modified Independent;Driving             Mobility Comments: No need for assistive device ADLs Comments: Reports independenct     Hand Dominance   Dominant Hand: Right    Extremity/Trunk Assessment   Upper Extremity Assessment Upper Extremity Assessment: Defer to OT evaluation    Lower Extremity Assessment Lower Extremity Assessment: RLE deficits/detail (Noting LLE tends to internally rotate at rest; Pt reports this is normal) RLE Deficits / Details: Grossly decr AROM and strength, limited by pain postop    Cervical / Trunk Assessment Cervical / Trunk Assessment: Kyphotic (forward head and shoulders)  Communication   Communication: HOH  Cognition Arousal/Alertness: Awake/alert Behavior During Therapy: WFL for tasks assessed/performed Overall Cognitive Status: Within Functional Limits for tasks assessed                                          General Comments General comments (skin integrity, edema, etc.): No DOE on Room Air; Pt and  daughter with questions about pressure areas on bil lateral malleoli; donned shoes to see if they were rubbing that area -- they weren't; NT applied silicone foam dressings    Exercises     Assessment/Plan    PT Assessment Patient needs continued PT services  PT Problem List Decreased strength;Decreased range of motion;Decreased activity tolerance       PT Treatment Interventions DME instruction;Gait training;Stair training;Functional mobility training;Therapeutic activities;Therapeutic exercise;Balance training;Patient/family education    PT Goals (Current goals can be found in the Care Plan section)  Acute Rehab PT Goals Patient Stated Goal: would love to go to Acute Inpt Rehab PT Goal Formulation: With patient Time For Goal Achievement: 03/30/21 Potential to Achieve Goals: Good    Frequency Min 4X/week     Co-evaluation               AM-PAC PT "6 Clicks" Mobility  Outcome Measure Help needed turning from your back to your side while in a flat bed without using bedrails?: A Little Help needed moving from lying on your back to sitting on the side of a flat bed without using bedrails?: A Lot Help needed moving to and from a bed to a chair (including a wheelchair)?: A Lot Help needed standing up  from a chair using your arms (e.g., wheelchair or bedside chair)?: A Lot Help needed to walk in hospital room?: A Lot Help needed climbing 3-5 steps with a railing? : A Lot 6 Click Score: 13    End of Session Equipment Utilized During Treatment: Gait belt Activity Tolerance: Patient tolerated treatment well Patient left: in chair;with call bell/phone within reach;with chair alarm set;with family/visitor present Nurse Communication: Mobility status PT Visit Diagnosis: Unsteadiness on feet (R26.81);History of falling (Z91.81);Pain;Other abnormalities of gait and mobility (R26.89)    Time: 4643-1427 PT Time Calculation (min) (ACUTE ONLY): 32 min   Charges:   PT  Evaluation $PT Eval Moderate Complexity: 1 Mod PT Treatments $Gait Training: 8-22 mins        Roney Marion, PT  Acute Rehabilitation Services Pager [phone] Office [phone]   Colletta [place] 03/16/2021, 11:49 AM

## 2021-03-16 NOTE — Progress Notes (Signed)
PROGRESS NOTE  [name] EKC:[id number] DOB: [birth year] DOA: 03/14/2021 PCP: [name], MD  HPI/Recap of past 24 hours: [name] is a 88 y.o. male with history of A-fib on Eliquis, HTN, chronic anemia, history of recurrent left pleural effusion requiring multiple thoracentesis was brought to the ER after patient had a fall at a restaurant. Patient lost his balance and fell onto the floor. Denies losing consciousness. In the ED, CT head and C-spine were unremarkable. X-rays revealed right hip fracture. Orthopedic surgeon was consulted.  Patient admitted for further management.    Today, met patient walking with PT, denies any new complaints.  Discussed with daughter at bedside.   Assessment/Plan: Principal Problem:   Hip fracture (HCC) Active Problems:   HLD (hyperlipidemia)   Essential hypertension   PAF (paroxysmal atrial fibrillation) (HCC)   Displaced right femoral neck fracture after mechanical fall Orthopedics consulted, s/p hip hemiarthroplasty on 03/15/2021 DVT PPx, pain management as per orthopedics PT/OT post-surgery  Leukocytosis Likely reactive from surgery, afebrile Daily CBC  Acute blood loss anemia Likely from surgery, 9.4 postop, no signs of bleeding Baseline hemoglobin around 11 Monitor closely, daily CBC  Paroxysmal A-fib Rate controlled, normal sinus rhythm Not on any rate controlling meds Restarted Eliquis  History of recurrent pleural effusion Currently on room air, with no increased work of breathing S/p multiple thoracentesis Recent chest x-ray shows persistence of pleural effusion  Chronic diastolic HF Appears euvolemic Continue meds as above  Hypertension Uncontrolled likely due to pain Continue amlodipine, clonidine, lisinopril, minoxidil  Hyperlipidemia Continue statins    Estimated body mass index is 20.37 kg/m as calculated from the following:   Height as of this encounter: 5' 10.98" (1.803 m).   Weight as of this  encounter: 66.2 kg.     Code Status: Full  Family Communication: Discussed with daughter at bedside  Disposition Plan: Status is: Inpatient Remains inpatient appropriate because: Level of care     Consultants: Orthopedics  Procedures: Right hip hemiarthroplasty  Antimicrobials: None  DVT prophylaxis: Eliquis     Objective: Vitals:   03/15/21 2100 03/16/21 0511 03/16/21 0736 03/16/21 1516  BP: (!) 127/46 (!) 153/45 (!) 144/54 (!) 147/61  Pulse: 69 79 80 82  Resp:   18 17  Temp:  98.6 F (37 C) 98.4 F (36.9 C) 99.3 F (37.4 C)  TempSrc:  Oral Axillary Oral  SpO2: 96% 94% 93% 97%  Weight:      Height:        Intake/Output Summary (Last 24 hours) at 03/16/2021 1720 Last data filed at 03/16/2021 5056 Gross per 24 hour  Intake 0 ml  Output 480 ml  Net -480 ml   Filed Weights   03/14/21 2053 03/15/21 1157  Weight: 66.2 kg 66.2 kg    Exam: General: NAD  Cardiovascular: S1, S2 present Respiratory: CTAB Abdomen: Soft, nontender, nondistended, bowel sounds present Musculoskeletal: No bilateral pedal edema noted Skin: Chronic skin changes, noted multiple bruising Psychiatry: Normal mood     Data Reviewed: CBC: Recent Labs  Lab 03/14/21 2048 03/16/21 0221  WBC 6.8 13.9*  NEUTROABS 5.1 12.3*  HGB 12.0* 9.4*  HCT 36.2* 28.7*  MCV 90.0 90.0  PLT 258 979   Basic Metabolic Panel: Recent Labs  Lab 03/14/21 2048  NA 139  K 4.7  CL 106  CO2 27  GLUCOSE 173*  BUN 21  CREATININE 1.19  CALCIUM 8.8*   GFR: Estimated Creatinine Clearance: 39.4 mL/min (by C-G formula  based on SCr of 1.19 mg/dL). Liver Function Tests: Recent Labs  Lab 03/14/21 2048  AST 16  ALT 8  ALKPHOS 61  BILITOT 0.3  PROT 6.3*  ALBUMIN 3.5   No results for input(s): LIPASE, AMYLASE in the last 168 hours. No results for input(s): AMMONIA in the last 168 hours. Coagulation Profile: Recent Labs  Lab 03/15/21 0534  INR 1.3*   Cardiac Enzymes: No results for  input(s): CKTOTAL, CKMB, CKMBINDEX, TROPONINI in the last 168 hours. BNP (last 3 results) Recent Labs    08/12/20 1601 08/27/20 1529 09/17/20 1559  PROBNP 156.0* 1,372* 1,107*   HbA1C: No results for input(s): HGBA1C in the last 72 hours. CBG: Recent Labs  Lab 03/15/21 0758  GLUCAP 140*   Lipid Profile: No results for input(s): CHOL, HDL, LDLCALC, TRIG, CHOLHDL, LDLDIRECT in the last 72 hours. Thyroid Function Tests: No results for input(s): TSH, T4TOTAL, FREET4, T3FREE, THYROIDAB in the last 72 hours. Anemia Panel: No results for input(s): VITAMINB12, FOLATE, FERRITIN, TIBC, IRON, RETICCTPCT in the last 72 hours. Urine analysis:    Component Value Date/Time   COLORURINE YELLOW 10/26/2018 0014   APPEARANCEUR CLEAR 10/26/2018 0014   LABSPEC 1.018 10/26/2018 0014   PHURINE 5.0 10/26/2018 0014   GLUCOSEU NEGATIVE 10/26/2018 0014   GLUCOSEU 100 (A) 12/01/2016 1858   HGBUR SMALL (A) 10/26/2018 0014   HGBUR 2+ 12/11/2008 1025   BILIRUBINUR NEGATIVE 10/26/2018 0014   BILIRUBINUR 1+ 02/23/2018 1641   KETONESUR NEGATIVE 10/26/2018 0014   PROTEINUR 100 (A) 10/26/2018 0014   UROBILINOGEN negative (A) 02/23/2018 1641   UROBILINOGEN 0.2 12/01/2016 1858   NITRITE NEGATIVE 10/26/2018 0014   LEUKOCYTESUR NEGATIVE 10/26/2018 0014   Sepsis Labs: _0 (procalcitonin:4,lacticidven:4)  ) Recent Results (from the past 240 hour(s))  Resp Panel by RT-PCR (Flu A&B, Covid) Nasopharyngeal Swab     Status: None   Collection Time: 03/14/21  9:34 PM   Specimen: Nasopharyngeal Swab; Nasopharyngeal(NP) swabs in vial transport medium  Result Value Ref Range Status   SARS Coronavirus 2 by RT PCR NEGATIVE NEGATIVE Final    Comment: (NOTE) SARS-CoV-2 target nucleic acids are NOT DETECTED.  The SARS-CoV-2 RNA is generally detectable in upper respiratory specimens during the acute phase of infection. The lowest concentration of SARS-CoV-2 viral copies this assay can detect is 138 copies/mL.  A negative result does not preclude SARS-Cov-2 infection and should not be used as the sole basis for treatment or other patient management decisions. A negative result may occur with  improper specimen collection/handling, submission of specimen other than nasopharyngeal swab, presence of viral mutation(s) within the areas targeted by this assay, and inadequate number of viral copies(<138 copies/mL). A negative result must be combined with clinical observations, patient history, and epidemiological information. The expected result is Negative.  Fact Sheet for Patients:  EntrepreneurPulse.com.au  Fact Sheet for Healthcare Providers:  IncredibleEmployment.be  This test is no t yet approved or cleared by the [place] FDA and  has been authorized for detection and/or diagnosis of SARS-CoV-2 by FDA under an Emergency Use Authorization (EUA). This EUA will remain  in effect (meaning this test can be used) for the duration of the COVID-19 declaration under Section 564(b)(1) of the Act, 21 U.S.C.section 360bbb-3(b)(1), unless the authorization is terminated  or revoked sooner.       Influenza A by PCR NEGATIVE NEGATIVE Final   Influenza B by PCR NEGATIVE NEGATIVE Final    Comment: (NOTE) The Xpert Xpress SARS-CoV-2/FLU/RSV plus assay is intended as  an aid in the diagnosis of influenza from Nasopharyngeal swab specimens and should not be used as a sole basis for treatment. Nasal washings and aspirates are unacceptable for Xpert Xpress SARS-CoV-2/FLU/RSV testing.  Fact Sheet for Patients: EntrepreneurPulse.com.au  Fact Sheet for Healthcare Providers: IncredibleEmployment.be  This test is not yet approved or cleared by the [place] FDA and has been authorized for detection and/or diagnosis of SARS-CoV-2 by FDA under an Emergency Use Authorization (EUA). This EUA will remain in effect (meaning this test  can be used) for the duration of the COVID-19 declaration under Section 564(b)(1) of the Act, 21 U.S.C. section 360bbb-3(b)(1), unless the authorization is terminated or revoked.  Performed at [facility], [place] [address], [place], [place] [address]   Surgical pcr screen     Status: None   Collection Time: 03/15/21 11:49 AM   Specimen: Nasal Mucosa; Nasal Swab  Result Value Ref Range Status   MRSA, PCR NEGATIVE NEGATIVE Final   Staphylococcus aureus NEGATIVE NEGATIVE Final    Comment: (NOTE) The Xpert SA Assay (FDA approved for NASAL specimens in patients 68 years of age and older), is one component of a comprehensive surveillance program. It is not intended to diagnose infection nor to guide or monitor treatment. Performed at [facility], [place] [address] [place], [place] [address]       Studies: No results found.  Scheduled Meds:  amLODipine  10 mg Oral Daily   apixaban  2.5 mg Oral BID   atorvastatin  40 mg Oral Daily   docusate sodium  300 mg Oral BID   gabapentin  300 mg Oral QHS   lisinopril  2.5 mg Oral Daily   minoxidil  10 mg Oral BID   pantoprazole  40 mg Oral Daily   senna  1 tablet Oral BID   Zinc Oxide   Topical Q4H    Continuous Infusions:  methocarbamol (ROBAXIN) IV       LOS: 2 days     Alma Friendly, MD [facility]  If 7PM-7AM, please contact night-coverage [email or web address] 03/16/2021, 5:20 PM

## 2021-03-16 NOTE — TOC CAGE-AID Note (Signed)
Transition of Care (TOC) - CAGE-AID Screening ? ? ?Patient Details  ?Name: [name] ?MRN: [id number] ?Date of Birth: 1929/07/18 ? ?Transition of Care (TOC) CM/SW Contact:    ?Precious Bard, RN ?Phone Number: ?03/16/2021, 8:12 PM ? ? ?Clinical Narrative: ?Patient denies any alcohol or drug use, no need for substance abuse resources at this time. ? ? ?CAGE-AID Screening: ?  ? ?Have You Ever Felt You Ought to Cut Down on Your Drinking or Drug Use?: No ?Have People Annoyed You By Critizing Your Drinking Or Drug Use?: No ?Have You Felt Bad Or Guilty About Your Drinking Or Drug Use?: No ?Have You Ever Had a Drink or Used Drugs First Thing In The Morning to Steady Your Nerves or to Get Rid of a Hangover?: No ?CAGE-AID Score: 0 ? ?Substance Abuse Education Offered: No (no substance abuse) ? ?  ? ? ? ? ? ? ?

## 2021-03-17 ENCOUNTER — Encounter (HOSPITAL_COMMUNITY): Payer: Self-pay | Admitting: Orthopedic Surgery

## 2021-03-17 DIAGNOSIS — E782 Mixed hyperlipidemia: Secondary | ICD-10-CM | POA: Diagnosis not present

## 2021-03-17 DIAGNOSIS — I10 Essential (primary) hypertension: Secondary | ICD-10-CM | POA: Diagnosis not present

## 2021-03-17 DIAGNOSIS — S72001A Fracture of unspecified part of neck of right femur, initial encounter for closed fracture: Secondary | ICD-10-CM | POA: Diagnosis not present

## 2021-03-17 DIAGNOSIS — I480 Paroxysmal atrial fibrillation: Secondary | ICD-10-CM | POA: Diagnosis not present

## 2021-03-17 LAB — CBC WITH DIFFERENTIAL/PLATELET
Abs Immature Granulocytes: 0.04 10*3/uL (ref 0.00–0.07)
Basophils Absolute: 0.1 10*3/uL (ref 0.0–0.1)
Basophils Relative: 1 %
Eosinophils Absolute: 0.6 10*3/uL — ABNORMAL HIGH (ref 0.0–0.5)
Eosinophils Relative: 5 %
HCT: 27.1 % — ABNORMAL LOW (ref 39.0–52.0)
Hemoglobin: 9 g/dL — ABNORMAL LOW (ref 13.0–17.0)
Immature Granulocytes: 0 %
Lymphocytes Relative: 9 %
Lymphs Abs: 1 10*3/uL (ref 0.7–4.0)
MCH: 29.8 pg (ref 26.0–34.0)
MCHC: 33.2 g/dL (ref 30.0–36.0)
MCV: 89.7 fL (ref 80.0–100.0)
Monocytes Absolute: 1 10*3/uL (ref 0.1–1.0)
Monocytes Relative: 9 %
Neutro Abs: 7.9 10*3/uL — ABNORMAL HIGH (ref 1.7–7.7)
Neutrophils Relative %: 76 %
Platelets: 196 10*3/uL (ref 150–400)
RBC: 3.02 MIL/uL — ABNORMAL LOW (ref 4.22–5.81)
RDW: 14.2 % (ref 11.5–15.5)
WBC: 10.4 10*3/uL (ref 4.0–10.5)
nRBC: 0 % (ref 0.0–0.2)

## 2021-03-17 LAB — BASIC METABOLIC PANEL
Anion gap: 5 (ref 5–15)
BUN: 27 mg/dL — ABNORMAL HIGH (ref 8–23)
CO2: 25 mmol/L (ref 22–32)
Calcium: 8 mg/dL — ABNORMAL LOW (ref 8.9–10.3)
Chloride: 107 mmol/L (ref 98–111)
Creatinine, Ser: 1.17 mg/dL (ref 0.61–1.24)
GFR, Estimated: 60 mL/min — ABNORMAL LOW (ref >60)
Glucose, Bld: 103 mg/dL — ABNORMAL HIGH (ref 70–99)
Potassium: 4.4 mmol/L (ref 3.5–5.1)
Sodium: 137 mmol/L (ref 135–145)

## 2021-03-17 MED ORDER — MAGNESIUM HYDROXIDE 400 MG/5ML PO SUSP
30.0000 mL | Freq: Every day | ORAL | Status: DC | PRN
  Administered 2021-03-18: 30 mL via ORAL
  Filled 2021-03-17 (×2): qty 30

## 2021-03-17 MED ORDER — APIXABAN 5 MG PO TABS
5.0000 mg | ORAL_TABLET | Freq: Two times a day (BID) | ORAL | Status: DC
  Administered 2021-03-17 – 2021-03-22 (×10): 5 mg via ORAL
  Filled 2021-03-17 (×10): qty 1

## 2021-03-17 NOTE — Progress Notes (Signed)
PROGRESS NOTE  [name] BTD:[id number] DOB: [birth year] DOA: 03/14/2021 PCP: [name], MD  HPI/Recap of past 24 hours: [name] is a 88 y.o. male with history of A-fib on Eliquis, HTN, chronic anemia, history of recurrent left pleural effusion requiring multiple thoracentesis was brought to the ER after patient had a fall at a restaurant. Patient lost his balance and fell onto the floor. Denies losing consciousness. In the ED, CT head and C-spine were unremarkable. X-rays revealed right hip fracture. Orthopedic surgeon was consulted.  Patient admitted for further management.    Today, patient denies any new complaints.    Assessment/Plan: Principal Problem:   Hip fracture (HCC) Active Problems:   HLD (hyperlipidemia)   Essential hypertension   PAF (paroxysmal atrial fibrillation) (HCC)   Displaced right femoral neck fracture after mechanical fall Orthopedics consulted, s/p hip hemiarthroplasty on 03/15/2021 DVT PPx, pain management as per orthopedics PT/OT-->CIR  Leukocytosis Resolved Likely reactive from surgery, afebrile Daily CBC  Acute blood loss anemia Likely from surgery, 9.4 postop, no signs of bleeding Baseline hemoglobin around 11 Monitor closely, daily CBC  Paroxysmal A-fib Rate controlled, normal sinus rhythm Not on any rate controlling meds Restarted Eliquis  History of recurrent pleural effusion Currently on room air, with no increased work of breathing S/p multiple thoracentesis Recent chest x-ray shows persistence of pleural effusion  Chronic diastolic HF Appears euvolemic Continue meds as above  Hypertension Stable Continue amlodipine, clonidine, lisinopril, minoxidil  Hyperlipidemia Continue statins    Estimated body mass index is 20.37 kg/m as calculated from the following:   Height as of this encounter: 5' 10.98" (1.803 m).   Weight as of this encounter: 66.2 kg.     Code Status: Full  Family Communication: Discussed  with daughter at bedside on 03/16/20  Disposition Plan: Status is: Inpatient Remains inpatient appropriate because: Level of care     Consultants: Orthopedics  Procedures: Right hip hemiarthroplasty  Antimicrobials: None  DVT prophylaxis: Eliquis     Objective: Vitals:   03/16/21 2011 03/16/21 2100 03/17/21 0500 03/17/21 1433  BP: (!) 163/48 (!) 169/48 (!) 154/50 (!) 163/42  Pulse: 79 81 78   Resp:  16 15 17   Temp:  99.1 F (37.3 C) 100.3 F (37.9 C)   TempSrc:  Oral Axillary   SpO2: 99% 98% 97% 97%  Weight:      Height:        Intake/Output Summary (Last 24 hours) at 03/17/2021 1707 Last data filed at 03/17/2021 1400 Gross per 24 hour  Intake 720 ml  Output --  Net 720 ml   Filed Weights   03/14/21 2053 03/15/21 1157  Weight: 66.2 kg 66.2 kg    Exam: General: NAD  Cardiovascular: S1, S2 present Respiratory: CTAB Abdomen: Soft, nontender, nondistended, bowel sounds present Musculoskeletal: No bilateral pedal edema noted Skin: Chronic skin changes, noted multiple bruising Psychiatry: Normal mood     Data Reviewed: CBC: Recent Labs  Lab 03/14/21 2048 03/16/21 0221 03/17/21 0200  WBC 6.8 13.9* 10.4  NEUTROABS 5.1 12.3* 7.9*  HGB 12.0* 9.4* 9.0*  HCT 36.2* 28.7* 27.1*  MCV 90.0 90.0 89.7  PLT 258 207 106   Basic Metabolic Panel: Recent Labs  Lab 03/14/21 2048 03/17/21 0200  NA 139 137  K 4.7 4.4  CL 106 107  CO2 27 25  GLUCOSE 173* 103*  BUN 21 27*  CREATININE 1.19 1.17  CALCIUM 8.8* 8.0*   GFR: Estimated Creatinine Clearance: 40.1 mL/min (by  C-G formula based on SCr of 1.17 mg/dL). Liver Function Tests: Recent Labs  Lab 03/14/21 2048  AST 16  ALT 8  ALKPHOS 61  BILITOT 0.3  PROT 6.3*  ALBUMIN 3.5   No results for input(s): LIPASE, AMYLASE in the last 168 hours. No results for input(s): AMMONIA in the last 168 hours. Coagulation Profile: Recent Labs  Lab 03/15/21 0534  INR 1.3*   Cardiac Enzymes: No results for  input(s): CKTOTAL, CKMB, CKMBINDEX, TROPONINI in the last 168 hours. BNP (last 3 results) Recent Labs    08/12/20 1601 08/27/20 1529 09/17/20 1559  PROBNP 156.0* 1,372* 1,107*   HbA1C: No results for input(s): HGBA1C in the last 72 hours. CBG: Recent Labs  Lab 03/15/21 0758  GLUCAP 140*   Lipid Profile: No results for input(s): CHOL, HDL, LDLCALC, TRIG, CHOLHDL, LDLDIRECT in the last 72 hours. Thyroid Function Tests: No results for input(s): TSH, T4TOTAL, FREET4, T3FREE, THYROIDAB in the last 72 hours. Anemia Panel: No results for input(s): VITAMINB12, FOLATE, FERRITIN, TIBC, IRON, RETICCTPCT in the last 72 hours. Urine analysis:    Component Value Date/Time   COLORURINE YELLOW 10/26/2018 0014   APPEARANCEUR CLEAR 10/26/2018 0014   LABSPEC 1.018 10/26/2018 0014   PHURINE 5.0 10/26/2018 0014   GLUCOSEU NEGATIVE 10/26/2018 0014   GLUCOSEU 100 (A) 12/01/2016 1858   HGBUR SMALL (A) 10/26/2018 0014   HGBUR 2+ 12/11/2008 1025   BILIRUBINUR NEGATIVE 10/26/2018 0014   BILIRUBINUR 1+ 02/23/2018 1641   KETONESUR NEGATIVE 10/26/2018 0014   PROTEINUR 100 (A) 10/26/2018 0014   UROBILINOGEN negative (A) 02/23/2018 1641   UROBILINOGEN 0.2 12/01/2016 1858   NITRITE NEGATIVE 10/26/2018 0014   LEUKOCYTESUR NEGATIVE 10/26/2018 0014   Sepsis Labs: @LABRCNTIP (procalcitonin:4,lacticidven:4)  ) Recent Results (from the past 240 hour(s))  Resp Panel by RT-PCR (Flu A&B, Covid) Nasopharyngeal Swab     Status: None   Collection Time: 03/14/21  9:34 PM   Specimen: Nasopharyngeal Swab; Nasopharyngeal(NP) swabs in vial transport medium  Result Value Ref Range Status   SARS Coronavirus 2 by RT PCR NEGATIVE NEGATIVE Final    Comment: (NOTE) SARS-CoV-2 target nucleic acids are NOT DETECTED.  The SARS-CoV-2 RNA is generally detectable in upper respiratory specimens during the acute phase of infection. The lowest concentration of SARS-CoV-2 viral copies this assay can detect is 138 copies/mL.  A negative result does not preclude SARS-Cov-2 infection and should not be used as the sole basis for treatment or other patient management decisions. A negative result may occur with  improper specimen collection/handling, submission of specimen other than nasopharyngeal swab, presence of viral mutation(s) within the areas targeted by this assay, and inadequate number of viral copies(<138 copies/mL). A negative result must be combined with clinical observations, patient history, and epidemiological information. The expected result is Negative.  Fact Sheet for Patients:  EntrepreneurPulse.com.au  Fact Sheet for Healthcare Providers:  IncredibleEmployment.be  This test is no t yet approved or cleared by the [place] FDA and  has been authorized for detection and/or diagnosis of SARS-CoV-2 by FDA under an Emergency Use Authorization (EUA). This EUA will remain  in effect (meaning this test can be used) for the duration of the COVID-19 declaration under Section 564(b)(1) of the Act, 21 U.S.C.section 360bbb-3(b)(1), unless the authorization is terminated  or revoked sooner.       Influenza A by PCR NEGATIVE NEGATIVE Final   Influenza B by PCR NEGATIVE NEGATIVE Final    Comment: (NOTE) The Xpert Xpress SARS-CoV-2/FLU/RSV plus assay is  intended as an aid in the diagnosis of influenza from Nasopharyngeal swab specimens and should not be used as a sole basis for treatment. Nasal washings and aspirates are unacceptable for Xpert Xpress SARS-CoV-2/FLU/RSV testing.  Fact Sheet for Patients: EntrepreneurPulse.com.au  Fact Sheet for Healthcare Providers: IncredibleEmployment.be  This test is not yet approved or cleared by the [place] FDA and has been authorized for detection and/or diagnosis of SARS-CoV-2 by FDA under an Emergency Use Authorization (EUA). This EUA will remain in effect (meaning this test  can be used) for the duration of the COVID-19 declaration under Section 564(b)(1) of the Act, 21 U.S.C. section 360bbb-3(b)(1), unless the authorization is terminated or revoked.  Performed at [facility], [place] [address] [place], [place] [address]   Surgical pcr screen     Status: None   Collection Time: 03/15/21 11:49 AM   Specimen: Nasal Mucosa; Nasal Swab  Result Value Ref Range Status   MRSA, PCR NEGATIVE NEGATIVE Final   Staphylococcus aureus NEGATIVE NEGATIVE Final    Comment: (NOTE) The Xpert SA Assay (FDA approved for NASAL specimens in patients 57 years of age and older), is one component of a comprehensive surveillance program. It is not intended to diagnose infection nor to guide or monitor treatment. Performed at [facility], [place] [address], [place],  [address]       Studies: No results found.  Scheduled Meds:  amLODipine  10 mg Oral Daily   apixaban  5 mg Oral BID   atorvastatin  40 mg Oral Daily   docusate sodium  300 mg Oral BID   gabapentin  300 mg Oral QHS   lisinopril  2.5 mg Oral Daily   minoxidil  10 mg Oral BID   pantoprazole  40 mg Oral Daily   senna  1 tablet Oral BID   Zinc Oxide   Topical Q4H    Continuous Infusions:  methocarbamol (ROBAXIN) IV       LOS: 3 days     Alma Friendly, MD [facility]  If 7PM-7AM, please contact night-coverage [email or web address] 03/17/2021, 5:07 PM

## 2021-03-17 NOTE — Progress Notes (Signed)
Inpatient Rehabilitation Admissions Coordinator  ? ?I met at bedside for rehab assessment. Patient previously at [place] and prefers [facility] admit again. I discussed goals and expectations of a possible [facility] admit. I will begin Auth with [facility] for possible admit and follow up on Tuesday. ? ?Danne Baxter, RN, MSN ?Rehab Admissions Coordinator ?[phone] ?03/17/2021 10:52 AM ? ?

## 2021-03-17 NOTE — PMR Pre-admission (Shared)
PMR Admission Coordinator Pre-Admission Assessment  Patient: [name] is an 88 y.o., male MRN: [id number] DOB: October 16, 1929 Height: 5' 10.98" (180.3 cm) Weight: 66.2 kg  Insurance Information HMO: yes    PPO:      PCP:      IPA:      80/20:      OTHER:  PRIMARY: [employer]      Policy#: [id number]      Subscriber: pt CM Name: ***      Phone#: [phone] ext ***     Fax#: [phone] Pre-Cert#: [id number]      Employer:  Benefits:  Phone #: [phone]     Name: 3/6 Eff. Date: 01/12/2021     Deduct: none      Out of Pocket Max: $4020      Life Max: none CIR: ***      SNF: *** Outpatient: ***     Co-Pay: *** Home Health: ***      Co-Pay: *** DME: ***     Co-Pay: *** Providers: in network  SECONDARY: none       Financial Counselor:       Phone#:   The Actuary for patients in Inpatient Rehabilitation Facilities with attached Privacy Act [place] Records was provided and verbally reviewed with: Patient  Emergency Contact Information Contact Information     Name Relation Home Work Mobile   Cooke,Scarlet Daughter   [phone]   Michae, Grimley [phone]  [phone]      Current Medical History  Patient Admitting Diagnosis: Hip fracture  History of Present Illness: 88 year old male with history of afib on Eliquis, HTN, chronic anemia, recurrent pleural effusion requiring multiple thoracentesis who presented on on 03/14/2021 after a fall at a restaurant.  No loss of consciousness.   Xray revealed tight hip fracture. CT head and C spine were unremarkable. Orthopedic consulted and underwent hip hemiarthroplasty on 03/15/2021. Hospitalist consulted for medical management. Leukocytosis noted and felt reactive to surgery. Daily CBC. Acute blood loss anemia likely from surgery. Baseline Hgb 11 and need to monitor closely. Chronic diastolic HF and appears euvolemic. HTN with elevation BP likely due to pain. Continue amlodipine,  clonidine, lisinopril and minoxidil. Currently on room air with recent chest xray shows persistence of pleural effusion and to monitor closely.  Patient's medical record from [facility] has been reviewed by the rehabilitation admission coordinator and physician.  Past Medical History  Past Medical History:  Diagnosis Date   Acute bronchitis 06/10/2015   Allergic rhinitis    Anemia    Asthma    Carotid stenosis    a. s/p Left CEA 2002;  b. Carotid US 3/16:  patent R CEA, L < 40%   Chronic pain syndrome    Left shoulder, back   Diverticulosis    Dyslipidemia    Epigastric pain 04/05/2016   Esophageal stricture    Distal, benign   GERD (gastroesophageal reflux disease)    Hemorrhoids    HTN (hypertension)    Negative renal duplex 07-22-11   Hx of echocardiogram    a. Echo 10/11: mod LVH, EF 55-60%   Pleural effusion 07/17/2020   Prostate cancer ([place]) 2000   Seed XRT   Spondylosis, cervical, with myelopathy 11/26/2015   Stroke ([place]) 8/01   right thalamic - on chronic Plavix/ASA   Has the patient had major surgery during 100 days prior to admission? Yes  Family History   family history includes Alcohol abuse  in his brother; Cancer in his brother; Diabetes in his son; Gout in his son; Hyperlipidemia in his mother; Hypertension in his mother; Lung disease in his father; Obesity in his sister; Stroke in his brother, mother, and sister.  Current Medications  Current Facility-Administered Medications:    acetaminophen (TYLENOL) tablet 325-650 mg, 325-650 mg, Oral, Q6H PRN, Rod Can, MD, 650 mg at 03/18/21 0801   albuterol (PROVENTIL) (2.5 MG/3ML) 0.083% nebulizer solution 3 mL, 3 mL, Inhalation, Q6H PRN, Swinteck, Aaron Edelman, MD   amLODipine (NORVASC) tablet 10 mg, 10 mg, Oral, Daily, Swinteck, Aaron Edelman, MD, 10 mg at 03/20/21 0836   amoxicillin-clavulanate (AUGMENTIN) 875-125 MG per tablet 1 tablet, 1 tablet, Oral, Q12H, Adhikari, Amrit, MD, 1 tablet at 03/20/21 0835   apixaban  (ELIQUIS) tablet 5 mg, 5 mg, Oral, BID, Hammons, Kimberly B, RPH, 5 mg at 03/20/21 0836   atorvastatin (LIPITOR) tablet 40 mg, 40 mg, Oral, Daily, Swinteck, Aaron Edelman, MD, 40 mg at 03/20/21 0835   HYDROcodone-acetaminophen (NORCO/VICODIN) 5-325 MG per tablet 1 tablet, 1 tablet, Oral, Q6H PRN, Alma Friendly, MD, 1 tablet at 03/20/21 0951   labetalol (NORMODYNE) injection 5 mg, 5 mg, Intravenous, Q2H PRN, Rod Can, MD, 5 mg at 03/15/21 0536   lisinopril (ZESTRIL) tablet 2.5 mg, 2.5 mg, Oral, Daily, Swinteck, Aaron Edelman, MD, 2.5 mg at 03/20/21 0835   magnesium hydroxide (MILK OF MAGNESIA) suspension 30 mL, 30 mL, Oral, Daily PRN, Alma Friendly, MD, 30 mL at 03/18/21 1122   menthol-cetylpyridinium (CEPACOL) lozenge 3 mg, 1 lozenge, Oral, PRN **OR** phenol (CHLORASEPTIC) mouth spray 1 spray, 1 spray, Mouth/Throat, PRN, Swinteck, Aaron Edelman, MD   methocarbamol (ROBAXIN) tablet 500 mg, 500 mg, Oral, Q6H PRN, 500 mg at 03/19/21 1249 **OR** methocarbamol (ROBAXIN) 500 mg in dextrose 5 % 50 mL IVPB, 500 mg, Intravenous, Q6H PRN, Swinteck, Aaron Edelman, MD   metoCLOPramide (REGLAN) tablet 5-10 mg, 5-10 mg, Oral, Q8H PRN **OR** metoCLOPramide (REGLAN) injection 5-10 mg, 5-10 mg, Intravenous, Q8H PRN, Swinteck, Aaron Edelman, MD   minoxidil (LONITEN) tablet 10 mg, 10 mg, Oral, BID, Swinteck, Aaron Edelman, MD, 10 mg at 03/20/21 0913   morphine (PF) 2 MG/ML injection 0.5-1 mg, 0.5-1 mg, Intravenous, Q2H PRN, Swinteck, Aaron Edelman, MD, 0.5 mg at 03/20/21 0114   ondansetron (ZOFRAN) tablet 4 mg, 4 mg, Oral, Q6H PRN, 4 mg at 03/17/21 1426 **OR** ondansetron (ZOFRAN) injection 4 mg, 4 mg, Intravenous, Q6H PRN, Swinteck, Aaron Edelman, MD   pantoprazole (PROTONIX) EC tablet 40 mg, 40 mg, Oral, Daily, Alma Friendly, MD, 40 mg at 03/20/21 0836   polyethylene glycol (MIRALAX / GLYCOLAX) packet 17 g, 17 g, Oral, Daily PRN, Rod Can, MD, 17 g at 03/18/21 1122   senna (SENOKOT) tablet 8.6 mg, 1 tablet, Oral, BID, Swinteck, Aaron Edelman, MD, 8.6 mg  at 03/20/21 0836   Zinc Oxide (TRIPLE PASTE) 12.8 % ointment, , Topical, Q4H, Alma Friendly, MD, Given at 03/18/21 1125  Patients Current Diet:  Diet Order             Diet regular Room service appropriate? Yes; Fluid consistency: Thin  Diet effective now                  Precautions / Restrictions Precautions Precautions: Fall Precaution Comments: JP drain Restrictions Weight Bearing Restrictions: Yes RLE Weight Bearing: Weight bearing as tolerated   Has the patient had 2 or more falls or a fall with injury in the past year? Yes  Prior Activity Level Community (5-7x/wk): independent without AD, driving, active  Prior Functional Level Self Care: Did the patient need help bathing, dressing, using the toilet or eating? Independent  Indoor Mobility: Did the patient need assistance with walking from room to room (with or without device)? Independent  Stairs: Did the patient need assistance with internal or external stairs (with or without device)? Independent  Functional Cognition: Did the patient need help planning regular tasks such as shopping or remembering to take medications? Independent  Patient Information Are you of Hispanic, Latino/a,or Spanish origin?: A. No, not of Hispanic, Latino/a, or Spanish origin What is your race?: A. White Do you need or want an interpreter to communicate with a doctor or health care staff?: 0. No  Patient's Response To:  Health Literacy and Transportation Is the patient able to respond to health literacy and transportation needs?: Yes Health Literacy - How often do you need to have someone help you when you read instructions, pamphlets, or other written material from your doctor or pharmacy?: Never In the past 12 months, has lack of transportation kept you from medical appointments or from getting medications?: No In the past 12 months, has lack of transportation kept you from meetings, work, or from getting things needed for  daily living?: No  Home Assistive Devices / Equipment Home Equipment: Conservation officer, nature (2 wheels), Shower seat, BSC/3in1  Prior Device Use: Indicate devices/aids used by the patient prior to current illness, exacerbation or injury? None of the above  Current Functional Level Cognition  Overall Cognitive Status: Within Functional Limits for tasks assessed Orientation Level: Oriented X4    Extremity Assessment (includes Sensation/Coordination)  Upper Extremity Assessment: Overall WFL for tasks assessed  Lower Extremity Assessment: Defer to PT evaluation RLE Deficits / Details: Grossly decr AROM and strength, limited by pain postop    ADLs  Overall ADL's : Needs assistance/impaired Lower Body Dressing: Maximal assistance, Sit to/from stand Lower Body Dressing Details (indicate cue type and reason): pt able to reach toward mid shin, but unable to doff socks.  able to thread feet through pantleg with mod A Toilet Transfer: Minimal assistance, Stand-pivot, BSC/3in1, Rolling walker (2 wheels) Toileting- Clothing Manipulation and Hygiene: Minimal assistance, Sit to/from stand Functional mobility during ADLs: Minimal assistance, Rolling walker (2 wheels) General ADL Comments: Overall, pt able to complete UB ADL's and grooming/self feeding with set up while seated. LB ADL's at this time require min-mod A due to hip sx precautions and pain.    Mobility  Overal bed mobility: Needs Assistance Bed Mobility: Supine to Sit Supine to sit: Min assist General bed mobility comments: min HHA to elevate trunk    Transfers  Overall transfer level: Needs assistance Equipment used: Rolling walker (2 wheels) Transfers: Sit to/from Stand Sit to Stand: Min assist General transfer comment: min assist to steady after coming to standing, cues for safe hand placement    Ambulation / Gait / Stairs / Wheelchair Mobility  Ambulation/Gait Ambulation/Gait assistance: Min guard, Min assist Gait Distance (Feet):  75 Feet (15+60 EOB to BR, seated, BR to hall) Assistive device: Rolling walker (2 wheels) Gait Pattern/deviations: Step-through pattern, Decreased step length - right, Decreased step length - left, Trunk flexed, Shuffle, Scissoring, Narrow base of support General Gait Details: slow with very narrow base of support with pt stepping on R foot with L for multiple occurances. Cues to widen BOS, take longer steps and to elevate trunk with pt able to correct slightly but unable to maintain. Gait velocity: decr    Posture / Balance Balance Overall balance assessment:  Needs assistance Sitting-balance support: Feet supported Sitting balance-Leahy Scale: Good Standing balance support: Reliant on assistive device for balance Standing balance-Leahy Scale: Poor    Special needs/care consideration    Previous Home Environment  Living Arrangements: Spouse/significant other  Lives With: Spouse Available Help at Discharge: Family, Available PRN/intermittently Type of Home: House Home Layout: One level Home Access: Stairs to enter Entrance Stairs-Rails: Left Entrance Stairs-Number of Steps: 4-5 Bathroom Shower/Tub: Multimedia programmer: Handicapped height Bathroom Accessibility: Yes How Accessible: Accessible via walker Home Care Services: No Additional Comments: Gives his wife assistance -- she had a TKR in Nov 22  Discharge Living Setting Plans for Discharge Living Setting: Patient's home, Lives with (comment) (wife) Type of Home at Discharge: House Discharge Home Layout: One level Discharge Home Access: Stairs to enter Entrance Stairs-Rails: Left Entrance Stairs-Number of Steps: 4-5 Discharge Bathroom Shower/Tub: Walk-in shower Discharge Bathroom Toilet: Handicapped height Discharge Bathroom Accessibility: Yes How Accessible: Accessible via walker Does the patient have any problems obtaining your medications?: No  Social/Family/Support Systems Patient Roles: Spouse,  Parent Contact Information: daughter, Dewayne Shorter Anticipated Caregiver: wife, 3 children, Designer, fashion/clothing, daughter is Therapist, nutritional in ED Anticipated Caregiver's Contact Information: see contacts Ability/Limitations of Caregiver: wife 41 years old and using a cane; Scarlette works nights ED RN Caregiver Availability: 24/7 Discharge Plan Discussed with Primary Caregiver: Yes Is Caregiver In Agreement with Plan?: Yes Does Caregiver/Family have Issues with Lodging/Transportation while Pt is in Rehab?: No  Goals Patient/Family Goal for Rehab: Mod I to supervision with PT, intermittent Mod I to min with OT Expected length of stay: ELOS 7 to 10 days Pt/Family Agrees to Admission and willing to participate: Yes Program Orientation Provided & Reviewed with Pt/Caregiver Including Roles  & Responsibilities: Yes  Decrease burden of Care through IP rehab admission: n/a  Possible need for SNF placement upon discharge: not anticipated  Patient Condition: I have reviewed medical records from [facility], spoken with CM, and patient. I met with patient at the bedside for inpatient rehabilitation assessment.  Patient will benefit from ongoing PT and OT, can actively participate in 3 hours of therapy a day 5 days of the week, and can make measurable gains during the admission.  Patient will also benefit from the coordinated team approach during an Inpatient Acute Rehabilitation admission.  The patient will receive intensive therapy as well as Rehabilitation physician, nursing, social worker, and care management interventions.  Due to bladder management, bowel management, safety, skin/wound care, disease management, medication administration, pain management, and patient education the patient requires 24 hour a day rehabilitation nursing.  The patient is currently mod assist overall with mobility and basic ADLs.  Discharge setting and therapy post discharge at home with home health is anticipated.  Patient has  agreed to participate in the Acute Inpatient Rehabilitation Program and will admit today.  Preadmission Screen Completed By:  Cleatrice Burke, 03/20/2021 12:20 PM ______________________________________________________________________   Discussed status with Dr. [name] Kitchen on *** at *** and received approval for admission today.  Admission Coordinator:  Cleatrice Burke, RN, time [name] KitchenSudie Grumbling ***   Assessment/Plan: Diagnosis: Does the need for close, 24 hr/day Medical supervision in concert with the patient's rehab needs make it unreasonable for this patient to be served in a less intensive setting? {yes_no_potentially:3041433} Co-Morbidities requiring supervision/potential complications: *** Due to {due EX:9371696}, does the patient require 24 hr/day rehab nursing? {yes_no_potentially:3041433} Does the patient require coordinated care of a physician, rehab nurse, PT, OT, and SLP to address physical  and functional deficits in the context of the above medical diagnosis(es)? {yes_no_potentially:3041433} Addressing deficits in the following areas: {deficits:3041436} Can the patient actively participate in an intensive therapy program of at least 3 hrs of therapy 5 days a week? {yes_no_potentially:3041433} The potential for patient to make measurable gains while on inpatient rehab is {potential:3041437} Anticipated functional outcomes upon discharge from inpatient rehab: {functional outcomes:304600100} PT, {functional outcomes:304600100} OT, {functional outcomes:304600100} SLP Estimated rehab length of stay to reach the above functional goals is: *** Anticipated discharge destination: {anticipated dc setting:21604} 10. Overall Rehab/Functional Prognosis: {potential:3041437}   MD Signature: ***

## 2021-03-17 NOTE — Care Management Important Message (Signed)
Important Message ? ?Patient Details  ?Name: [name] ?MRN: [id number] ?Date of Birth: 01-Nov-1929 ? ? ?Medicare Important Message Given:  Yes ? ? ? ? ?Levada Dy  Yesika Rispoli-Martin ?03/17/2021, 12:45 PM ?

## 2021-03-17 NOTE — Progress Notes (Signed)
Physical Therapy Treatment ?Patient Details ?Name: [name] ?MRN: [id number] ?DOB: [birth year] ?Today's Date: 03/17/2021 ? ? 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 ? ?  ?PT Comments  ? ? Pt received supine and motivated for session with steady progression towards goals. Pt requiring grossly min assist for transfers and progression of gait training with increased distance tolerated. Pt requiring cues  to lengthen stride with ability to correct but inability to maintain. Pt with good tolerance for LE therex for increased strength and ROM. Current plan remains appropriate to increase their independence and safety with mobility to allow discharge to the venue listed below. Pt continues to benefit from skilled PT services to progress toward functional mobility goals.  ?  ?Recommendations for follow up therapy are one component of a multi-disciplinary discharge planning process, led by the attending physician.  Recommendations may be updated based on patient status, additional functional criteria and insurance authorization. ? ?Follow Up Recommendations ? Acute inpatient rehab (3hours/day) ?  ?  ?Assistance Recommended at Discharge Set up Supervision/Assistance  ?Patient can return home with the following A little help with walking and/or transfers;A little help with bathing/dressing/bathroom;Assistance with cooking/housework;Help with stairs or ramp for entrance ?   ?Equipment Recommendations ? Other (comment)  ?  ?Recommendations for Other Services OT consult;Rehab consult (as ordered) ? ? ?  ?Precautions / Restrictions Precautions ?Precautions: Fall ?Precaution Comments: JP drain ?Restrictions ?Weight Bearing Restrictions: Yes ?RLE Weight Bearing: Weight bearing as tolerated  ?  ? ?Mobility ? Bed Mobility ?Overal bed mobility: Needs Assistance ?Bed Mobility: Supine to Sit ?  ?  ?Supine to sit: Min assist ?  ?  ?General bed mobility comments: min assist to elevate trunk ?  ? ?Transfers ?Overall transfer level: Needs assistance ?Equipment used: Rolling walker (2 wheels) ?Transfers: Sit to/from Stand ?Sit to Stand: Min assist ?  ?  ?  ?  ?  ?General transfer comment: min assist to power up, cues for hand placement and to shift RLE forward when sitting for pain reduction ?  ? ?Ambulation/Gait ?Ambulation/Gait assistance: Min assist ?Gait Distance (Feet): 45 Feet ?Assistive device: Rolling walker (2 wheels) ?Gait Pattern/deviations: Step-through pattern, Decreased step length - right, Decreased step length - left, Trunk flexed ?Gait velocity: decr ?  ?  ?General Gait Details: Short, inefficient steps despitelengthen steps; Good use of RW for supoprt, pt able to correct and lengthen stride but unable to maintain ? ? ?Stairs ?  ?  ?  ?  ?  ? ? ?Wheelchair Mobility ?  ? ?Modified Rankin (Stroke Patients Only) ?  ? ? ?  ?Balance Overall balance assessment: Needs assistance ?Sitting-balance support: Feet supported ?Sitting balance-Leahy Scale: Fair ?  ?  ?Standing balance support: Reliant on assistive device for balance ?Standing balance-Leahy Scale: Poor ?  ?  ?  ?  ?  ?  ?  ?  ?  ?  ?  ?  ?  ? ?  ?Cognition Arousal/Alertness: Awake/alert ?Behavior During Therapy: [facility] for tasks assessed/performed ?Overall Cognitive Status: Within Functional Limits for tasks assessed ?  ?  ?  ?  ?  ?  ?  ?  ?  ?  ?  ?  ?  ?  ?  ?  ?  ?  ?  ? ?  ?  Exercises General Exercises - Lower Extremity ?Long Arc  Quad: AROM, Right, 10 reps, Seated ?Hip Flexion/Marching: AROM, Right, 10 reps, Seated ?Toe Raises: AROM, Both, 10 reps, Seated ?Heel Raises: AROM, Both, 10 reps, Seated ? ?  ?General Comments General comments (skin integrity, edema, etc.): VSS on RA ?  ?  ? ?Pertinent Vitals/Pain Pain Assessment ?Pain Assessment: No/denies pain  ? ? ?Home Living   ?  ?  ?  ?  ?  ?  ?  ?  ?  ?   ?  ?Prior Function    ?  ?  ?   ? ?PT Goals (current goals can now be found in the care plan section) Acute Rehab PT Goals ?PT Goal Formulation: With patient ?Time For Goal Achievement: 03/30/21 ? ?  ?Frequency ? ? ? Min 4X/week ? ? ? ?  ?PT Plan    ? ? ?Co-evaluation   ?  ?  ?  ?  ? ?  ?AM-PAC PT "6 Clicks" Mobility   ?Outcome Measure ? Help needed turning from your back to your side while in a flat bed without using bedrails?: A Little ?Help needed moving from lying on your back to sitting on the side of a flat bed without using bedrails?: A Little ?Help needed moving to and from a bed to a chair (including a wheelchair)?: A Little ?Help needed standing up from a chair using your arms (e.g., wheelchair or bedside chair)?: A Little ?Help needed to walk in hospital room?: A Little ?Help needed climbing 3-5 steps with a railing? : Total ?6 Click Score: 16 ? ?  ?End of Session Equipment Utilized During Treatment: Gait belt ?Activity Tolerance: Patient tolerated treatment well ?Patient left: in chair;with call bell/phone within reach; ?Nurse Communication: Mobility status ?PT Visit Diagnosis: Unsteadiness on feet (R26.81);History of falling (Z91.81);Pain;Other abnormalities of gait and mobility (R26.89) ?  ? ? ?Time: 9629-5284 ?PT Time Calculation (min) (ACUTE ONLY): 26 min ? ?Charges:  $Gait Training: 8-22 mins ?$Therapeutic Exercise: 8-22 mins          ?          ? ?Lenora Boys. PTA ?Acute Rehabilitation Services ?Office: [phone] ? ? ? ?[name] ?03/17/2021, 12:38 PM ? ?

## 2021-03-18 ENCOUNTER — Inpatient Hospital Stay (HOSPITAL_COMMUNITY): Payer: Medicare HMO

## 2021-03-18 DIAGNOSIS — I480 Paroxysmal atrial fibrillation: Secondary | ICD-10-CM | POA: Diagnosis not present

## 2021-03-18 DIAGNOSIS — I10 Essential (primary) hypertension: Secondary | ICD-10-CM | POA: Diagnosis not present

## 2021-03-18 DIAGNOSIS — E782 Mixed hyperlipidemia: Secondary | ICD-10-CM | POA: Diagnosis not present

## 2021-03-18 DIAGNOSIS — S72001A Fracture of unspecified part of neck of right femur, initial encounter for closed fracture: Secondary | ICD-10-CM | POA: Diagnosis not present

## 2021-03-18 LAB — CBC WITH DIFFERENTIAL/PLATELET
Abs Immature Granulocytes: 0.05 10*3/uL (ref 0.00–0.07)
Basophils Absolute: 0.1 10*3/uL (ref 0.0–0.1)
Basophils Relative: 1 %
Eosinophils Absolute: 0.7 10*3/uL — ABNORMAL HIGH (ref 0.0–0.5)
Eosinophils Relative: 7 %
HCT: 29.5 % — ABNORMAL LOW (ref 39.0–52.0)
Hemoglobin: 9.8 g/dL — ABNORMAL LOW (ref 13.0–17.0)
Immature Granulocytes: 1 %
Lymphocytes Relative: 9 %
Lymphs Abs: 1 10*3/uL (ref 0.7–4.0)
MCH: 29.8 pg (ref 26.0–34.0)
MCHC: 33.2 g/dL (ref 30.0–36.0)
MCV: 89.7 fL (ref 80.0–100.0)
Monocytes Absolute: 1 10*3/uL (ref 0.1–1.0)
Monocytes Relative: 9 %
Neutro Abs: 8 10*3/uL — ABNORMAL HIGH (ref 1.7–7.7)
Neutrophils Relative %: 73 %
Platelets: 239 10*3/uL (ref 150–400)
RBC: 3.29 MIL/uL — ABNORMAL LOW (ref 4.22–5.81)
RDW: 13.9 % (ref 11.5–15.5)
WBC: 10.8 10*3/uL — ABNORMAL HIGH (ref 4.0–10.5)
nRBC: 0 % (ref 0.0–0.2)

## 2021-03-18 LAB — URINALYSIS, ROUTINE W REFLEX MICROSCOPIC
Bilirubin Urine: NEGATIVE
Glucose, UA: NEGATIVE mg/dL
Ketones, ur: NEGATIVE mg/dL
Leukocytes,Ua: NEGATIVE
Nitrite: NEGATIVE
Protein, ur: 100 mg/dL — AB
Specific Gravity, Urine: 1.017 (ref 1.005–1.030)
pH: 5 (ref 5.0–8.0)

## 2021-03-18 LAB — BASIC METABOLIC PANEL
Anion gap: 8 (ref 5–15)
BUN: 24 mg/dL — ABNORMAL HIGH (ref 8–23)
CO2: 24 mmol/L (ref 22–32)
Calcium: 8.2 mg/dL — ABNORMAL LOW (ref 8.9–10.3)
Chloride: 104 mmol/L (ref 98–111)
Creatinine, Ser: 1.05 mg/dL (ref 0.61–1.24)
GFR, Estimated: >60 mL/min (ref >60)
Glucose, Bld: 117 mg/dL — ABNORMAL HIGH (ref 70–99)
Potassium: 4.7 mmol/L (ref 3.5–5.1)
Sodium: 136 mmol/L (ref 135–145)

## 2021-03-18 MED ORDER — HYDROCODONE-ACETAMINOPHEN 5-325 MG PO TABS
1.0000 | ORAL_TABLET | Freq: Four times a day (QID) | ORAL | Status: DC | PRN
  Administered 2021-03-18 – 2021-03-22 (×10): 1 via ORAL
  Filled 2021-03-18 (×11): qty 1

## 2021-03-18 MED ORDER — SODIUM CHLORIDE 0.9 % IV SOLN
3.0000 g | Freq: Four times a day (QID) | INTRAVENOUS | Status: DC
  Administered 2021-03-18 – 2021-03-19 (×4): 3 g via INTRAVENOUS
  Filled 2021-03-18 (×4): qty 8

## 2021-03-18 NOTE — Progress Notes (Addendum)
Inpatient Rehabilitation Admissions Coordinator  ? ?I met at bedside with patient and daughter to review denial from [facility] for CIR admit. I provided appeal information and daughter is pursuing appeal. I will follow up once appeal determination is made. ? ?Danne Baxter, RN, MSN ?Rehab Admissions Coordinator ?([phone] ?03/18/2021 4:20 PM ? ?

## 2021-03-18 NOTE — Evaluation (Signed)
Clinical/Bedside Swallow Evaluation ?Patient Details  ?Name: [name] ?MRN: [id number] ?Date of Birth: February 05, 1929 ? ?Today's Date: 03/18/2021 ?Time: SLP Start Time (ACUTE ONLY): 8182 SLP Stop Time (ACUTE ONLY): 9937 ?SLP Time Calculation (min) (ACUTE ONLY): 15 min ? ?Past Medical History:  ?Past Medical History:  ?Diagnosis Date  ? Acute bronchitis 06/10/2015  ? Allergic rhinitis   ? Anemia   ? Asthma   ? Carotid stenosis   ? a. s/p Left CEA 2002;  b. Carotid US 3/16:  patent R CEA, L < 40%  ? Chronic pain syndrome   ? Left shoulder, back  ? Diverticulosis   ? Dyslipidemia   ? Epigastric pain 04/05/2016  ? Esophageal stricture   ? Distal, benign  ? GERD (gastroesophageal reflux disease)   ? Hemorrhoids   ? HTN (hypertension)   ? Negative renal duplex 07-22-11  ? Hx of echocardiogram   ? a. Echo 10/11: mod LVH, EF 55-60%  ? Pleural effusion 07/17/2020  ? Prostate cancer ([place]) 2000  ? Seed XRT  ? Spondylosis, cervical, with myelopathy 11/26/2015  ? Stroke [facility]) 8/01  ? right thalamic - on chronic Plavix/ASA  ? ?Past Surgical History:  ?Past Surgical History:  ?Procedure Laterality Date  ? ANTERIOR APPROACH HEMI HIP ARTHROPLASTY Right 03/15/2021  ? Procedure: ANTERIOR APPROACH HEMI HIP ARTHROPLASTY;  Surgeon: Rod Can, MD;  Location: [place];  Service: Orthopedics;  Laterality: Right;  ? APPENDECTOMY  1953  ? CAROTID ENDARTERECTOMY Right 01/2000  ? CATARACT EXTRACTION Bilateral   ? CERVICAL DISC SURGERY  8/07  ? CHEST TUBE INSERTION Left 11/11/2020  ? Procedure: INSERTION PLEURAL DRAINAGE CATHETER;  Surgeon: [name], DO;  Location: [place] [facility];  Service: Pulmonary;  Laterality: Left;  Indwelling Pleural Catheter  ? HIP ARTHROPLASTY Left 10/24/2018  ? Procedure: ARTHROPLASTY BIPOLAR HIP (HEMIARTHROPLASTY);  Surgeon: Paralee Cancel, MD;  Location: [place];  Service: Orthopedics;  Laterality: Left;  ? INSERTION PROSTATE RADIATION SEED  2000  ? [place] SURGERY  1990s  ? NASAL SINUS SURGERY    ? x 3  ? REMOVAL OF  PLEURAL DRAINAGE CATHETER N/A 12/24/2020  ? Procedure: MINOR REMOVAL OF PLEURAL DRAINAGE CATHETER;  Surgeon: [name], DO;  Location: [facility];  Service: Cardiopulmonary;  Laterality: N/A;  ? THORACENTESIS N/A 08/15/2020  ? Procedure: THORACENTESIS;  Surgeon: [name], DO;  Location: [place] [facility];  Service: Pulmonary;  Laterality: N/A;  ? ?HPI:  ?88 year old admitted after fall at a restaurant resulting in R hip fx; now s/p THA for fracture fixation, direct anterior, WBAT; has a past medical history of Acute bronchitis (06/10/2015), Allergic rhinitis, Anemia, Asthma, Carotid stenosis, Chronic pain syndrome, Diverticulosis, Dyslipidemia, Epigastric pain (04/05/2016), Esophageal stricture (dilated 2012), GERD (gastroesophageal reflux disease), Hemorrhoids, HTN (hypertension), echocardiogram, Pleural effusion (07/17/2020), Prostate cancer ([name]) (2000), Spondylosis, cervical, with myelopathy (11/26/2015), and Stroke ([name]) (8/01). CXR on 3/7 showed Increased mild RIGHT upper lobe airspace disease representing  asymmetric edema versus pneumonia or aspiration pneumonitis.  ?  ?Assessment / Plan / Recommendation  ?Clinical Impression ? Patient is not currently presenting with clinical s/s of dysphagia as per this bedside/clinical swallow evaluation. SLP observed patient eating dinner meal (sandwich) without any observed difficulties. Patient himself describes occasions where he will have feeling of food stuck in throat and/or refluxing symptoms but "it doesnt happen all the time". Patient, spouse and daughter all report that there has been no recent or current change in his swallow function and no change in  his GERD. SLP advised patient to let his nurse know if he is experiencing any change in his swallow function beyond his baseline esophageal dysphagia. ? ?SLP Visit Diagnosis: Dysphagia, unspecified (R13.10) ?   ?Aspiration Risk ? No limitations  ?  ?Diet Recommendation Regular;Thin liquid  ? ?Liquid  Administration via: Cup;Straw ?Medication Administration: Whole meds with liquid ?Supervision: Patient able to self feed ?Postural Changes: Seated upright at 90 degrees;Remain upright for at least 30 minutes after po intake  ?  ?Other  Recommendations Oral Care Recommendations: Oral care BID   ? ?Recommendations for follow up therapy are one component of a multi-disciplinary discharge planning process, led by the attending physician.  Recommendations may be updated based on patient status, additional functional criteria and insurance authorization. ? ?Follow up Recommendations No SLP follow up  ? ? ?  ?Assistance Recommended at Discharge None  ?Functional Status Assessment Patient has not had a recent decline in their functional status  ?Frequency and Duration    ?  ?  ?   ? ?Prognosis    ? ?  ? ?Swallow Study   ?General Date of Onset: 03/18/21 ?HPI: 88 year old admitted after fall at a restaurant resulting in R hip fx; now s/p THA for fracture fixation, direct anterior, WBAT; has a past medical history of Acute bronchitis (06/10/2015), Allergic rhinitis, Anemia, Asthma, Carotid stenosis, Chronic pain syndrome, Diverticulosis, Dyslipidemia, Epigastric pain (04/05/2016), Esophageal stricture (dilated 2012), GERD (gastroesophageal reflux disease), Hemorrhoids, HTN (hypertension), echocardiogram, Pleural effusion (07/17/2020), Prostate cancer ([place]) (2000), Spondylosis, cervical, with myelopathy (11/26/2015), and Stroke ([name]) (8/01). CXR on 3/7 showed Increased mild RIGHT upper lobe airspace disease representing  asymmetric edema versus pneumonia or aspiration pneumonitis. ?Type of Study: Bedside Swallow Evaluation ?Previous Swallow Assessment: none found ?Diet Prior to this Study: Regular;Thin liquids ?Temperature Spikes Noted: No ?Respiratory Status: Room air ?History of Recent Intubation: No ?Behavior/Cognition: Alert;Cooperative;Pleasant mood ?Oral Cavity Assessment: Within Functional Limits ?Oral Care Completed by SLP:  No ?Oral Cavity - Dentition: Adequate natural dentition ?Vision: Functional for self-feeding ?Self-Feeding Abilities: Able to feed self ?Patient Positioning: Upright in chair ?Baseline Vocal Quality: Normal ?Volitional Cough: Strong ?Volitional Swallow: Able to elicit  ?  ?Oral/Motor/Sensory Function Overall Oral Motor/Sensory Function: Within functional limits   ?Ice Chips     ?Thin Liquid Thin Liquid: Within functional limits  ?  ?Nectar Thick     ?Honey Thick     ?Puree Puree: Not tested   ?Solid ? ? ?  Solid: Within functional limits ?Presentation: Self Fed  ? ?  ? ?Sonia Baller, MA, CCC-SLP ?Speech Therapy ? ? ? ? ?

## 2021-03-18 NOTE — Progress Notes (Signed)
Inpatient Rehabilitation Admissions Coordinator  ? ?I met at bedside with patient to inform him that his [facility] has denied approval to admit to CIR. I left a voicemail for his daughter, Scarlet to contact me to discuss rehab venue options. Acute team and TOC made aware.  ? ?Danne Baxter, RN, MSN ?Rehab Admissions Coordinator ?[phone] ?03/18/2021 11:30 AM ? ?

## 2021-03-18 NOTE — Progress Notes (Signed)
Zinc cream placed on would/abrasion on right buttock - pt refuse foam.  Education provided.  Pt verbalized understanding, however, refused foam.  Pt stated he has dealt with the would/abrasion for a while now.  Will continue to monitor and provide education.   ?

## 2021-03-18 NOTE — Progress Notes (Addendum)
Pharmacy Antibiotic Note ? ?[name] is a 88 y.o. male admitted on 03/14/2021 with hip fracture after fall at a resturant.  S/p right total hip arthroplasty on 03/15/21.  Tmax 100.5 on 3/7 AM with mild leukocytosis.  Pharmacy has been consulted on 03/18/21 for Unasyn dosing for aspiration pneumonia ? ?Plan: ?Unasyn 3 g IV every 6 hours ?Monitor clinical status, renal function and culture results daily.  ? ? ? ?Height: 5' 10.98" (180.3 cm) ?Weight: 66.2 kg (146 lb) ?IBW/kg (Calculated) : 75.26 ? ?Temp (24hrs), Avg:99.1 ?F (37.3 ?C), Min:97.7 ?F (36.5 ?C), Max:100.5 ?F (38.1 ?C) ? ?Recent Labs  ?Lab 03/14/21 ?2048 03/16/21 ?0221 03/17/21 ?0200 03/18/21 ?0215  ?WBC 6.8 13.9* 10.4 10.8*  ?CREATININE 1.19  --  1.17 1.05  ?  ?Estimated Creatinine Clearance: 44.7 mL/min (by C-G formula based on SCr of 1.05 mg/dL).   ? ?Allergies  ?Allergen Reactions  ? Hctz [Hydrochlorothiazide] Shortness Of Breath, Swelling and Other (See Comments)  ?  "Swelling and dyspnea" ?  ? Hydralazine Shortness Of Breath and Other (See Comments)  ?  Chest pain and GI issues also  ? Diovan [Valsartan] Other (See Comments)  ?  Elevated potassium- Hyperkalemia  ? Metformin And Related Other (See Comments)  ?  Reaction not recalled  ? Nsaids Other (See Comments)  ?  Other than Tylenol, he isn't to have these because he's on Eliquis  ? Other Other (See Comments)  ?  Unnamed topically-applied B/P patch = Caused redness  ? Prednisone Other (See Comments)  ?  Suicidal thoughts  ? Spironolactone Swelling  ?  Site of swelling not recalled  ? Codeine Itching and Nausea Only  ? Oxycodone-Acetaminophen Itching and Nausea Only  ? ? ?Antimicrobials this admission: ?Unasyn 3/7>> ?Cefazolin 3/4 > 3/4 post op surgical ppx ? ?Dose adjustments this admission: ? ? ?Microbiology results: ?3/7 urine Cx: in process ? 3/4 MRSA PCR: negative: SA : neg ? ? ?Thank you for allowing pharmacy to be a part of this patient?s care. ? ? ?Nicole Cella, RPh ?Clinical Pharmacist ?03/18/2021  3:16 PM ?Please check AMION for all [place] phone numbers ?After 10:00 PM, call [place] [phone] ? ? ?

## 2021-03-18 NOTE — Plan of Care (Signed)

## 2021-03-18 NOTE — Progress Notes (Signed)
PROGRESS NOTE  [name] XNA:[id number] DOB: 01-Jun-1929 DOA: 03/14/2021 PCP: [name], MD  HPI/Recap of past 24 hours: [name] is a 88 y.o. male with history of A-fib on Eliquis, HTN, chronic anemia, history of recurrent left pleural effusion requiring multiple thoracentesis was brought to the ER after patient had a fall at a restaurant. Patient lost his balance and fell onto the floor. Denies losing consciousness. In the ED, CT head and C-spine were unremarkable. X-rays revealed right hip fracture. Orthopedic surgeon was consulted.  Patient admitted for further management.    Today, met patient sleeping, easily arousable. Appeared a little lethargic this am. Noted a low grade fever this morning.   Assessment/Plan: Principal Problem:   Hip fracture (HCC) Active Problems:   HLD (hyperlipidemia)   Essential hypertension   PAF (paroxysmal atrial fibrillation) (HCC)   Displaced right femoral neck fracture after mechanical fall Orthopedics consulted, s/p hip hemiarthroplasty on 03/15/2021 DVT PPx, pain management as per orthopedics PT/OT-->CIR denied stay, now for SNF  ??Aspiration PNA Now with 100.5 temp on 03/18/21, with mild leukocytosis UA neg, noted blood, UC pending CXR showed mildmild RUL airspace disease, PNA vs aspiration PNA, small L pleural effusion SLP consulted Start IV Unasyn  Daily CBC, monitor fever curve  Acute blood loss anemia Likely from surgery Baseline hemoglobin around 11 Monitor closely, daily CBC  Paroxysmal A-fib Rate controlled, normal sinus rhythm Not on any rate controlling meds Restarted Eliquis  History of recurrent pleural effusion Currently on room air, with no increased work of breathing S/p multiple thoracentesis  Chronic diastolic HF Appears euvolemic Continue meds as above  Hypertension Stable Continue amlodipine, clonidine, lisinopril, minoxidil  Hyperlipidemia Continue statins    Estimated body mass index is  20.37 kg/m as calculated from the following:   Height as of this encounter: 5' 10.98" (1.803 m).   Weight as of this encounter: 66.2 kg.     Code Status: Full  Family Communication: Discussed with daughter at bedside on 03/16/20  Disposition Plan: Status is: Inpatient Remains inpatient appropriate because: Level of care     Consultants: Orthopedics  Procedures: Right hip hemiarthroplasty  Antimicrobials: Unasyn  DVT prophylaxis: Eliquis     Objective: Vitals:   03/17/21 1946 03/18/21 0526 03/18/21 0744 03/18/21 1033  BP: (!) 153/46 (!) 172/56 (!) 161/75   Pulse:  87 91   Resp: 12 20 20    Temp: 99.1 F (37.3 C)  (!) 100.5 F (38.1 C) 97.7 F (36.5 C)  TempSrc: Oral  Oral Oral  SpO2: 96% 92% 97%   Weight:      Height:        Intake/Output Summary (Last 24 hours) at 03/18/2021 1456 Last data filed at 03/18/2021 1111 Gross per 24 hour  Intake --  Output 371 ml  Net -371 ml   Filed Weights   03/14/21 2053 03/15/21 1157  Weight: 66.2 kg 66.2 kg    Exam: General: NAD, sleepy Cardiovascular: S1, S2 present Respiratory: CTAB Abdomen: Soft, nontender, nondistended, bowel sounds present Musculoskeletal: No bilateral pedal edema noted Skin: Chronic skin changes, noted multiple bruising Psychiatry: Normal mood     Data Reviewed: CBC: Recent Labs  Lab 03/14/21 2048 03/16/21 0221 03/17/21 0200 03/18/21 0215  WBC 6.8 13.9* 10.4 10.8*  NEUTROABS 5.1 12.3* 7.9* 8.0*  HGB 12.0* 9.4* 9.0* 9.8*  HCT 36.2* 28.7* 27.1* 29.5*  MCV 90.0 90.0 89.7 89.7  PLT 258 207 196 542   Basic Metabolic Panel: Recent Labs  Lab 03/14/21 2048 03/17/21 0200 03/18/21 0215  NA 139 137 136  K 4.7 4.4 4.7  CL 106 107 104  CO2 27 25 24   GLUCOSE 173* 103* 117*  BUN 21 27* 24*  CREATININE 1.19 1.17 1.05  CALCIUM 8.8* 8.0* 8.2*   GFR: Estimated Creatinine Clearance: 44.7 mL/min (by C-G formula based on SCr of 1.05 mg/dL). Liver Function Tests: Recent Labs  Lab  03/14/21 2048  AST 16  ALT 8  ALKPHOS 61  BILITOT 0.3  PROT 6.3*  ALBUMIN 3.5   No results for input(s): LIPASE, AMYLASE in the last 168 hours. No results for input(s): AMMONIA in the last 168 hours. Coagulation Profile: Recent Labs  Lab 03/15/21 0534  INR 1.3*   Cardiac Enzymes: No results for input(s): CKTOTAL, CKMB, CKMBINDEX, TROPONINI in the last 168 hours. BNP (last 3 results) Recent Labs    08/12/20 1601 08/27/20 1529 09/17/20 1559  PROBNP 156.0* 1,372* 1,107*   HbA1C: No results for input(s): HGBA1C in the last 72 hours. CBG: Recent Labs  Lab 03/15/21 0758  GLUCAP 140*   Lipid Profile: No results for input(s): CHOL, HDL, LDLCALC, TRIG, CHOLHDL, LDLDIRECT in the last 72 hours. Thyroid Function Tests: No results for input(s): TSH, T4TOTAL, FREET4, T3FREE, THYROIDAB in the last 72 hours. Anemia Panel: No results for input(s): VITAMINB12, FOLATE, FERRITIN, TIBC, IRON, RETICCTPCT in the last 72 hours. Urine analysis:    Component Value Date/Time   COLORURINE YELLOW 03/18/2021 1050   APPEARANCEUR CLEAR 03/18/2021 1050   LABSPEC 1.017 03/18/2021 1050   PHURINE 5.0 03/18/2021 1050   GLUCOSEU NEGATIVE 03/18/2021 1050   GLUCOSEU 100 (A) 12/01/2016 1858   HGBUR MODERATE (A) 03/18/2021 1050   HGBUR 2+ 12/11/2008 1025   BILIRUBINUR NEGATIVE 03/18/2021 1050   BILIRUBINUR 1+ 02/23/2018 1641   KETONESUR NEGATIVE 03/18/2021 1050   PROTEINUR 100 (A) 03/18/2021 1050   UROBILINOGEN negative (A) 02/23/2018 1641   UROBILINOGEN 0.2 12/01/2016 1858   NITRITE NEGATIVE 03/18/2021 1050   LEUKOCYTESUR NEGATIVE 03/18/2021 1050   Sepsis Labs: @LABRCNTIP (procalcitonin:4,lacticidven:4)  ) Recent Results (from the past 240 hour(s))  Resp Panel by RT-PCR (Flu A&B, Covid) Nasopharyngeal Swab     Status: None   Collection Time: 03/14/21  9:34 PM   Specimen: Nasopharyngeal Swab; Nasopharyngeal(NP) swabs in vial transport medium  Result Value Ref Range Status   SARS  Coronavirus 2 by RT PCR NEGATIVE NEGATIVE Final    Comment: (NOTE) SARS-CoV-2 target nucleic acids are NOT DETECTED.  The SARS-CoV-2 RNA is generally detectable in upper respiratory specimens during the acute phase of infection. The lowest concentration of SARS-CoV-2 viral copies this assay can detect is 138 copies/mL. A negative result does not preclude SARS-Cov-2 infection and should not be used as the sole basis for treatment or other patient management decisions. A negative result may occur with  improper specimen collection/handling, submission of specimen other than nasopharyngeal swab, presence of viral mutation(s) within the areas targeted by this assay, and inadequate number of viral copies(<138 copies/mL). A negative result must be combined with clinical observations, patient history, and epidemiological information. The expected result is Negative.  Fact Sheet for Patients:  EntrepreneurPulse.com.au  Fact Sheet for Healthcare Providers:  IncredibleEmployment.be  This test is no t yet approved or cleared by the [place] FDA and  has been authorized for detection and/or diagnosis of SARS-CoV-2 by FDA under an Emergency Use Authorization (EUA). This EUA will remain  in effect (meaning this test can be used) for the duration  of the COVID-19 declaration under Section 564(b)(1) of the Act, 21 U.S.C.section 360bbb-3(b)(1), unless the authorization is terminated  or revoked sooner.       Influenza A by PCR NEGATIVE NEGATIVE Final   Influenza B by PCR NEGATIVE NEGATIVE Final    Comment: (NOTE) The Xpert Xpress SARS-CoV-2/FLU/RSV plus assay is intended as an aid in the diagnosis of influenza from Nasopharyngeal swab specimens and should not be used as a sole basis for treatment. Nasal washings and aspirates are unacceptable for Xpert Xpress SARS-CoV-2/FLU/RSV testing.  Fact Sheet for  Patients: EntrepreneurPulse.com.au  Fact Sheet for Healthcare Providers: IncredibleEmployment.be  This test is not yet approved or cleared by the [place] FDA and has been authorized for detection and/or diagnosis of SARS-CoV-2 by FDA under an Emergency Use Authorization (EUA). This EUA will remain in effect (meaning this test can be used) for the duration of the COVID-19 declaration under Section 564(b)(1) of the Act, 21 U.S.C. section 360bbb-3(b)(1), unless the authorization is terminated or revoked.  Performed at [facility], [place] [address], [place], [place] [address]   Surgical pcr screen     Status: None   Collection Time: 03/15/21 11:49 AM   Specimen: Nasal Mucosa; Nasal Swab  Result Value Ref Range Status   MRSA, PCR NEGATIVE NEGATIVE Final   Staphylococcus aureus NEGATIVE NEGATIVE Final    Comment: (NOTE) The Xpert SA Assay (FDA approved for NASAL specimens in patients 1 years of age and older), is one component of a comprehensive surveillance program. It is not intended to diagnose infection nor to guide or monitor treatment. Performed at [facility], [place] [address] [place], [place] [address]       Studies: DG Chest Port 1 View  Result Date: 03/18/2021 CLINICAL DATA:  Fever postop EXAM: PORTABLE CHEST 1 VIEW COMPARISON:  03/14/2021 FINDINGS: Stable large cardiac silhouette. Small LEFT effusion new from prior. Mild airspace disease in the RIGHT upper lobe increased from prior. No pneumothorax. IMPRESSION: 1. Increased mild RIGHT upper lobe airspace disease representing asymmetric edema versus pneumonia or aspiration pneumonitis. 2. Small LEFT effusion. Electronically Signed   By: Suzy Bouchard M.D.   On: 03/18/2021 08:50    Scheduled Meds:  amLODipine  10 mg Oral Daily   apixaban  5 mg Oral BID   atorvastatin  40 mg Oral Daily   lisinopril  2.5 mg Oral Daily   minoxidil  10 mg Oral BID   pantoprazole  40 mg  Oral Daily   senna  1 tablet Oral BID   Zinc Oxide   Topical Q4H    Continuous Infusions:  methocarbamol (ROBAXIN) IV       LOS: 4 days     Alma Friendly, MD [facility]  If 7PM-7AM, please contact night-coverage [email or web address] 03/18/2021, 2:56 PM

## 2021-03-18 NOTE — Progress Notes (Signed)
Physical Therapy Treatment ?Patient Details ?Name: [name] ?MRN: [id number] ?DOB: Feb 08, 1929 ?Today's Date: 03/18/2021 ? ? 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 ? ?  ?PT Comments  ? ? Pt making slow steady progress towards goals with increased tolerance for gait training this session. Pt continues to require min assist to come to standing for powering up and stability once standing and min assist to min guard for ambulation for cues for upright posture and to lengthen stride. Pt continues to exhibit unsteadiness during ambulation, especially when turning. Discussed with pt alternatives to d/c home, as pt spouse unable to assist and pt daughter not available during day, that would provide pt ability to increase functional mobility and safety for an ultimate safe return home. After discussion pt in agreement with SNF at d/c. Recommend short term skilled rehab post d/c to address functional limitations and deficits, maximize independence and safety with mobility, and due to lack of assistance at home. Pt continues to benefit from skilled PT services to progress toward functional mobility goals.  ? ?    ?Recommendations for follow up therapy are one component of a multi-disciplinary discharge planning process, led by the attending physician.  Recommendations may be updated based on patient status, additional functional criteria and insurance authorization. ? ?Follow  Up Recommendations ? Skilled nursing-short term rehab (<3 hours/day) ?  ?  ?Assistance Recommended at Discharge Set up Supervision/Assistance  ?Patient can return home with the following A little help with walking and/or transfers;A little help with bathing/dressing/bathroom;Assistance with cooking/housework;Help with stairs or ramp for entrance ?  ?Equipment Recommendations ? Rolling walker (2 wheels)  ?  ?Recommendations for Other Services OT consult (as ordered) ? ? ?  ?Precautions / Restrictions Precautions ?Precautions: Fall ?Precaution Comments: JP drain ?Restrictions ?Weight Bearing Restrictions: Yes ?RLE Weight Bearing: Weight bearing as tolerated  ?  ? ?Mobility ? Bed Mobility ?  ?  ?  ?  ?  ?  ?  ?General bed mobility comments: OOB in recliner upone arrival` ?  ? ?Transfers ?Overall transfer level: Needs assistance ?Equipment used: Rolling walker (2 wheels) ?Transfers: Sit to/from Stand ?Sit to Stand: Min assist ?  ?  ?  ?  ?  ?General transfer comment: min assist to power up and steady after coming to standing, cues for hand placement and to shift RLE forward when sitting for pain reduction ?  ? ?Ambulation/Gait ?Ambulation/Gait assistance: Min assist ?Gait Distance (Feet): 80 Feet ?Assistive device: Rolling walker (2 wheels) ?Gait Pattern/deviations: Step-through pattern, Decreased step length - right, Decreased step length - left, Trunk flexed, Shuffle ?Gait velocity: decr ?  ?  ?General Gait Details: Short, inefficient steps despite cues to lengthen; Good use of RW for supoprt, pt able to correct and lengthen stride but unable to maintain, flexed posture with ability to correct with verbal cues but inability to maintain ? ? ?Stairs ?  ?  ?  ?  ?  ? ? ?Wheelchair Mobility ?  ? ?Modified Rankin (Stroke Patients Only) ?  ? ? ?  ?Balance Overall balance assessment: Needs assistance ?Sitting-balance support: Feet  supported ?Sitting balance-Leahy Scale: Fair ?  ?  ?Standing balance support: Reliant on  assistive device for balance ?Standing balance-Leahy Scale: Poor ?  ?  ?  ?  ?  ?  ?  ?  ?  ?  ?  ?  ?  ? ?  ?Cognition Arousal/Alertness: Awake/alert ?Behavior During Therapy: [name] for tasks assessed/performed ?Overall Cognitive Status: Within Functional Limits for tasks assessed ?  ?  ?  ?  ?  ?  ?  ?  ?  ?  ?  ?  ?  ?  ?  ?  ?  ?  ?  ? ?  ?Exercises   ? ?  ?General Comments   ?  ?  ? ?Pertinent Vitals/Pain Pain Assessment ?Pain Assessment: No/denies pain  ? ? ?Home Living Family/patient expects to be discharged to:: Private residence ?Living Arrangements: Spouse/significant other ?Available Help at Discharge: Family;Available PRN/intermittently ?Type of Home: House ?Home Access: Stairs to enter ?Entrance Stairs-Rails: Left ?Entrance Stairs-Number of Steps: 4-5 ?  ?Home Layout: One level ?Home Equipment: Agricultural consultant (2 wheels);Shower seat;BSC/3in1 ?Additional Comments: Gives his wife assistance -- she had a TKR in Nov 22  ?  ?Prior Function    ?  ?  ?   ? ?PT Goals (current goals can now be found in the care plan section) Acute Rehab PT Goals ?PT Goal Formulation: With patient ?Time For Goal Achievement: 03/30/21 ? ?  ?Frequency ? ? ? Min 4X/week ? ? ? ?  ?PT Plan    ? ? ?Co-evaluation   ?  ?  ?  ?  ? ?  ?AM-PAC PT "6 Clicks" Mobility   ?Outcome Measure ? Help needed turning from your back to your side while in a flat bed without using bedrails?: A Little ?Help needed moving from lying on your back to sitting on the side of a flat bed without using bedrails?: A Little ?Help needed moving to and from a bed to a chair (including a wheelchair)?: A Little ?Help needed standing up from a chair using your arms (e.g., wheelchair or bedside chair)?: A Little ?Help needed to walk in hospital room?: A Little ?Help needed climbing 3-5 steps with a railing? : Total ?6 Click Score: 16 ? ?  ?End of Session Equipment Utilized During Treatment: Gait belt ?Activity Tolerance: Patient tolerated treatment well ?Patient left: in  chair;with call bell/phone within reach ?Nurse Communication: Mobility status;Other (comment) (pt beggining to agree to SNF) ?PT Visit Diagnosis: Unsteadiness on feet (R26.81);History of falling (Z91.81);Pain;Other abnormalities of gait and mobility (R26.89) ?  ? ? ?Time: 1610-9604 ?PT Time Calculation (min) (ACUTE ONLY): 24 min ? ?Charges:  $Gait Training: 8-22 mins ?$Therapeutic Activity: 8-22 mins          ?          ? ?Lenora Boys. PTA ?Acute Rehabilitation Services ?Office: [phone] ? ? ? ?Marlana Salvage Jaye Saal ?03/18/2021, 2:13 PM ? ?

## 2021-03-19 DIAGNOSIS — J690 Pneumonitis due to inhalation of food and vomit: Secondary | ICD-10-CM

## 2021-03-19 DIAGNOSIS — S72001A Fracture of unspecified part of neck of right femur, initial encounter for closed fracture: Secondary | ICD-10-CM | POA: Diagnosis not present

## 2021-03-19 DIAGNOSIS — D62 Acute posthemorrhagic anemia: Secondary | ICD-10-CM

## 2021-03-19 LAB — CBC WITH DIFFERENTIAL/PLATELET
Abs Immature Granulocytes: 0.04 10*3/uL (ref 0.00–0.07)
Basophils Absolute: 0 10*3/uL (ref 0.0–0.1)
Basophils Relative: 1 %
Eosinophils Absolute: 0.4 10*3/uL (ref 0.0–0.5)
Eosinophils Relative: 5 %
HCT: 27.2 % — ABNORMAL LOW (ref 39.0–52.0)
Hemoglobin: 9.2 g/dL — ABNORMAL LOW (ref 13.0–17.0)
Immature Granulocytes: 1 %
Lymphocytes Relative: 11 %
Lymphs Abs: 0.9 10*3/uL (ref 0.7–4.0)
MCH: 30.1 pg (ref 26.0–34.0)
MCHC: 33.8 g/dL (ref 30.0–36.0)
MCV: 88.9 fL (ref 80.0–100.0)
Monocytes Absolute: 0.8 10*3/uL (ref 0.1–1.0)
Monocytes Relative: 10 %
Neutro Abs: 6.1 10*3/uL (ref 1.7–7.7)
Neutrophils Relative %: 72 %
Platelets: 243 10*3/uL (ref 150–400)
RBC: 3.06 MIL/uL — ABNORMAL LOW (ref 4.22–5.81)
RDW: 13.8 % (ref 11.5–15.5)
WBC: 8.2 10*3/uL (ref 4.0–10.5)
nRBC: 0 % (ref 0.0–0.2)

## 2021-03-19 LAB — BASIC METABOLIC PANEL
Anion gap: 7 (ref 5–15)
BUN: 24 mg/dL — ABNORMAL HIGH (ref 8–23)
CO2: 25 mmol/L (ref 22–32)
Calcium: 8 mg/dL — ABNORMAL LOW (ref 8.9–10.3)
Chloride: 105 mmol/L (ref 98–111)
Creatinine, Ser: 1.07 mg/dL (ref 0.61–1.24)
GFR, Estimated: >60 mL/min (ref >60)
Glucose, Bld: 105 mg/dL — ABNORMAL HIGH (ref 70–99)
Potassium: 4.5 mmol/L (ref 3.5–5.1)
Sodium: 137 mmol/L (ref 135–145)

## 2021-03-19 MED ORDER — AMOXICILLIN-POT CLAVULANATE 875-125 MG PO TABS
1.0000 | ORAL_TABLET | Freq: Two times a day (BID) | ORAL | Status: DC
  Administered 2021-03-19 – 2021-03-22 (×7): 1 via ORAL
  Filled 2021-03-19 (×7): qty 1

## 2021-03-19 NOTE — TOC Initial Note (Addendum)
Transition of Care (TOC) - Initial/Assessment Note  ? ? ?Patient Details  ?Name: [name] ?MRN: [id number] ?Date of Birth: 05-19-1929 ? ?Transition of Care [facility]) CM/SW Contact:    ?Joanne Chars, LCSW ?Phone Number: ?03/19/2021, 10:05 AM ? ?Clinical Narrative:     CSW met with pt regarding denial of insurance auth for CIR, discussed SNF back up plan and pt is agreeable to this, but would like to discuss with his daughter as well.  Choice document given, permission given to send out referral in hub.  Permission given to speak with daughter Darrick Grinder.  Pt is vaccinated for covid with 2 boosters.   ? ?CSW attempted to contact daughter Scarlet, left message. ? ?1500: CSW spoke with pt again, he has not been able to reach his daughter today either, she most likely at work.  Pt agreeable to move forward with sending out referral to SNF, will discuss options with daughter when she is available.             ? ? ?Expected Discharge Plan: [place] ?Barriers to Discharge: Other (must enter comment), SNF Pending bed offer (pending CIR auth appeal) ? ? ?Patient Goals and CMS Choice ?Patient states their goals for this hospitalization and ongoing recovery are:: walk again ?CMS Medicare.gov Compare Post Acute Care list provided to:: Patient ?Choice offered to / list presented to : Patient ? ?Expected Discharge Plan and Services ?Expected Discharge Plan: [place] ?  ?  ?Post Acute Care Choice: IP Rehab ?Living arrangements for the past 2 months: [place] ?                ?  ?  ?  ?  ?  ?  ?  ?  ?  ?  ? ?Prior Living Arrangements/Services ?Living arrangements for the past 2 months: [place] ?Lives with:: Spouse ?Patient language and need for interpreter reviewed:: Yes ?Do you feel safe going back to the place where you live?: Yes      ?Need for Family Participation in Patient Care: No (Comment) ?Care giver support system in place?: Yes (comment) ?Current home services: Home OT, Home PT  (unclear, but pt reports home visits) ?Criminal Activity/Legal Involvement Pertinent to Current Situation/Hospitalization: No - Comment as needed ? ?Activities of Daily Living ?  ?ADL Screening (condition at time of admission) ?Patient's cognitive ability adequate to safely complete daily activities?: Yes ?Is the patient deaf or have difficulty hearing?: Yes ?Does the patient have difficulty seeing, even when wearing glasses/contacts?: No ?Does the patient have difficulty concentrating, remembering, or making decisions?: No ?Patient able to express need for assistance with ADLs?: Yes ?Does the patient have difficulty dressing or bathing?: Yes ?Independently performs ADLs?: No ?Does the patient have difficulty walking or climbing stairs?: Yes ?Weakness of Legs: Right ?Weakness of Arms/Hands: None ? ?Permission Sought/Granted ?Permission sought to share information with : Family Supports ?Permission granted to share information with : Yes, Verbal Permission Granted ? Share Information with NAME: daughter Darrick Grinder ? Permission granted to share info w AGENCY: SNF ?   ?   ? ?Emotional Assessment ?Appearance:: Appears stated age ?Attitude/Demeanor/Rapport: Engaged ?Affect (typically observed): Appropriate, Pleasant ?Orientation: : Oriented to Self, Oriented to Place, Oriented to  Time, Oriented to Situation ?Alcohol / Substance Use: Not Applicable ?Psych Involvement: No (comment) ? ?Admission diagnosis:  Hip fracture ([place]) [S72.009A] ?Surgery, elective [Z41.9] ?Closed fracture of neck of right femur, initial encounter ([place]) [S72.001A] ?Fall, initial encounter [W19.XXXA] ?History  of left hip replacement [Z96.642] ?Patient Active Problem List  ? Diagnosis Date Noted  ? PAF (paroxysmal atrial fibrillation) ([place]) 03/14/2021  ? Closed fracture of neck of right femur ([place])   ? Chest tube in place 12/24/2020  ? Abnormal positron emission tomography (PET) scan 10/01/2020  ? Former smoker 08/30/2020  ? 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  S/P thoracentesis   ? Thyroid nodule 07/17/2020  ? Pleural effusion 07/17/2020  ? Loss of weight 12/01/2019  ? Chronic nausea 12/01/2019  ? Cellulitis 11/29/2019  ? Hypoalbuminemia 05/29/2019  ? Hip pain 05/29/2019  ? Pedal edema 05/29/2019  ? Controlled type 2 diabetes mellitus with hyperglycemia, without long-term current use of insulin ([place])   ? Labile blood glucose   ? Postoperative pain   ? Closed left hip fracture, sequela 10/29/2018  ? Diabetic peripheral neuropathy ([place])   ? Essential hypertension   ? Chronic diastolic congestive heart failure ([place])   ? History of left hip hemiarthroplasty 10/28/2018  ? Femur fracture, left ([place]) 10/27/2018  ? Hip fracture ([place]) 10/23/2018  ? Hip fracture, left ([place]) 10/23/2018  ? Ankle pain 01/13/2018  ? Neck pain 01/13/2018  ? Constipation 01/11/2018  ? Low back pain 01/11/2018  ? Dysuria 12/06/2016  ? Weakness 12/06/2016  ? Depression due to physical illness 12/06/2016  ? Preventative health care 12/06/2016  ? Epigastric pain 04/05/2016  ? Spondylosis, cervical, with myelopathy 11/26/2015  ? Cervicogenic headache 11/26/2015  ? Umbilical hernia 18/20/9906  ? Asthma 10/08/2011  ? Carotid artery disease ([place]) 03/31/2011  ? Chronic pain   ? Vitamin D deficiency 12/11/2008  ? HLD (hyperlipidemia) 12/11/2008  ? Anemia 12/11/2008  ? Cerebral artery occlusion with cerebral infarction ([place]) 12/11/2008  ? URINARY INCONTINENCE, MALE 12/11/2008  ? Disorder of bursae and tendons in shoulder region 12/29/2007  ? ANXIETY, MILD 08/09/2007  ? Allergic rhinitis 01/04/2007  ? HEADACHE, TENSION 11/05/2006  ? PROSTATE CANCER, HX OF 11/05/2006  ? ?PCP:  [name], MD ?Pharmacy:   ?[facility] ?[address] ?[place] [address] ?Phone: [phone] Fax: [phone] ? ?[facility] ?[address] [place] [address] ?Phone: [phone] Fax: [phone] ? ?[facility] ?[address] ?[place] [address] ?Phone: [phone] Fax: [phone] ? ?[facility] ?[address] ?[place] [address] ?Phone: [phone] Fax: [phone] ? ? ? ? ?Social Determinants of Health (SDOH) Interventions ?  ? ?Readmission Risk Interventions ?No flowsheet data found. ? ? ?

## 2021-03-19 NOTE — TOC Progression Note (Addendum)
Transition of Care (TOC) - Progression Note  ? ? ?Patient Details  ?Name: [name] ?MRN: [id number] ?Date of Birth: 1929/08/30 ? ?Transition of Care (TOC) CM/SW Contact  ?Joanne Chars, LCSW ?Phone Number: ?03/19/2021, 1:23 PM ? ?Clinical Narrative:   CSW attempted to contact daughter by phone, left message.  MD informed CSW that daughter was present during his exam and informed him that if pt does not receive auth for CIR, plan will be to take him home. ? ?1500: CSW received call from daughter Darrick Grinder who confirmed the above, if no CIR wants to take pt home.  She asked how often HH would do PT.  She said she was told to call and initiate the appeal and is planning to do this in 30 minutes.   ? ?Expected Discharge Plan: [place] ?Barriers to Discharge: Other (must enter comment), SNF Pending bed offer (pending CIR auth appeal) ? ?Expected Discharge Plan and Services ?Expected Discharge Plan: [place] ?  ?  ?Post Acute Care Choice: IP Rehab ?Living arrangements for the past 2 months: [place] ?                ?  ?  ?  ?  ?  ?  ?  ?  ?  ?  ? ? ?Social Determinants of Health (SDOH) Interventions ?  ? ?Readmission Risk Interventions ?No flowsheet data found. ? ?

## 2021-03-19 NOTE — Progress Notes (Signed)
Physical Therapy Treatment ?Patient Details ?Name: [name] ?MRN: [id number] ?DOB: 1929-04-07 ?Today's Date: 03/19/2021 ? ? 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 ? ?  ?PT Comments  ? ? Pt received supine and agreeable to session with good tolerance for LE therex for increased strength and ROM. Pt with increased c/o R hip pain this AM, thus deferring progression of gait training, but stated decreased pain post therex and verbalized agreement to ambulate with MT today. Pt positioned to comfort in recliner and ice applied. Pt continues to be motivated to return to PLOF and continues to benefit from skilled PT services to progress toward functional mobility goals.  ?   ?Recommendations for follow up therapy are one component of a multi-disciplinary discharge planning process, led by the attending physician.  Recommendations may be updated based on patient status, additional functional criteria and insurance authorization. ? ?Follow Up Recommendations ? Skilled nursing-short term rehab (<3 hours/day) ?  ?  ?Assistance Recommended at Discharge Set up Supervision/Assistance  ?Patient can return home with the following A little help with walking and/or transfers;A little help with bathing/dressing/bathroom;Assistance with cooking/housework;Help with stairs or ramp for entrance ?  ?Equipment Recommendations ? Rolling walker (2 wheels)  ?  ?Recommendations for Other Services  OT consult (as ordered) ? ? ?  ?Precautions / Restrictions Precautions ?Precautions: Fall ?Precaution Comments: JP drain ?Restrictions ?Weight Bearing Restrictions: Yes ?RLE Weight Bearing: Weight bearing as tolerated  ?  ? ?Mobility ? Bed Mobility ?Overal bed mobility: Needs Assistance ?Bed Mobility: Supine to Sit ?  ?  ?Supine to sit: Min assist ?  ?  ?General bed mobility comments: min HHA to elevate trunk ?  ? ?Transfers ?Overall transfer level: Needs assistance ?Equipment used: Rolling walker (2 wheels) ?Transfers: Sit to/from Stand ?Sit to Stand: Min assist ?  ?  ?  ?  ?  ?General transfer comment: min assist to steady after coming to standing, cues for hand placement and to shift RLE forward when sitting for pain reduction ?  ? ?Ambulation/Gait ?Ambulation/Gait assistance: Min guard ?Gait Distance (Feet): 5 Feet ?Assistive device: Rolling walker (2 wheels) ?Gait Pattern/deviations: Step-through pattern, Decreased step length - right, Decreased step length - left, Trunk flexed, Shuffle ?Gait velocity: decr ?  ?  ?General Gait Details: pt declining hall ambulation this AM seconday to increased c/o R hip pain ? ? ?Stairs ?  ?  ?  ?  ?  ? ? ?Wheelchair Mobility ?  ? ?Modified Rankin (Stroke Patients Only) ?  ? ? ?  ?Balance Overall balance assessment: Needs assistance ?Sitting-balance support: Feet supported ?Sitting balance-Leahy Scale: Fair ?  ?  ?Standing balance support: Reliant on assistive device for balance ?Standing balance-Leahy Scale: Poor ?  ?  ?  ?  ?  ?  ?  ?  ?  ?  ?  ?  ?  ? ?  ?Cognition Arousal/Alertness: Awake/alert ?Behavior During Therapy: [facility] for tasks assessed/performed ?Overall Cognitive Status: Within Functional Limits for tasks assessed ?  ?  ?  ?  ?  ?  ?  ?  ?  ?  ?  ?  ?  ?  ?  ?  ?  ?  ?  ? ?  ?  Exercises Total Joint Exercises ?Heel Slides: AROM, Right, 10 reps, Supine ?Straight Leg Raises: AROM, AAROM, Right, 10 reps, Supine ?Long Arc Quad: AROM, Right, 10 reps, Seated ?Marching in  Standing: AROM, Right, 10 reps, Standing ?General Exercises - Lower Extremity ?Toe Raises: AROM, Both, 10 reps, Standing ? ?  ?General Comments General comments (skin integrity, edema, etc.): VSS on RA, pt continues with verbal complaints of swallowing problems and states annoyance with current cough ?  ?  ? ?Pertinent Vitals/Pain Pain Assessment ?Pain Assessment: Faces ?Faces Pain Scale: Hurts even more ?Pain Location: RLE ?Pain Descriptors / Indicators: Aching, Grimacing, Guarding, Operative site guarding ?Pain Intervention(s): Limited activity within patient's tolerance, Monitored during session, Repositioned, Patient requesting pain meds-RN notified, Ice applied  ? ? ?Home Living   ?  ?  ?  ?  ?  ?  ?  ?  ?  ?   ?  ?Prior Function    ?  ?  ?   ? ?PT Goals (current goals can now be found in the care plan section) Acute Rehab PT Goals ?PT Goal Formulation: With patient ?Time For Goal Achievement: 03/30/21 ? ?  ?Frequency ? ? ? Min 4X/week ? ? ? ?  ?PT Plan    ? ? ?Co-evaluation   ?  ?  ?  ?  ? ?  ?AM-PAC PT "6 Clicks" Mobility   ?Outcome Measure ? Help needed turning from your back to your side while in a flat bed without using bedrails?: A Little ?Help needed moving from lying on your back to sitting on the side of a flat bed without using bedrails?: A Little ?Help needed moving to and from a bed to a chair (including a wheelchair)?: A Little ?Help needed standing up from a chair using your arms (e.g., wheelchair or bedside chair)?: A Little ?Help needed to walk in hospital room?: A Little ?Help needed climbing 3-5 steps with a railing? : Total ?6 Click Score: 16 ? ?  ?End of Session Equipment Utilized During Treatment: Gait belt ?Activity Tolerance: Patient tolerated treatment well ?Patient left: in chair;with call bell/phone within reach ?Nurse Communication: Mobility status;Patient requests pain meds ?PT Visit Diagnosis: Unsteadiness on feet (R26.81);History of falling (Z91.81);Pain;Other abnormalities of gait  and mobility (R26.89) ?  ? ? ?Time: 1610-9604 ?PT Time Calculation (min) (ACUTE ONLY): 20 min ? ?Charges:  $Therapeutic Exercise: 8-22 mins          ?          ? ?Lenora Boys. PTA ?Acute Rehabilitation Services ?Office: [phone] ? ? ? ?[name] ?03/19/2021, 9:25 AM ? ?

## 2021-03-19 NOTE — Progress Notes (Signed)
?   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 ?Paged Dr. Bridgett Larsson to notify of 12 beat run SVT.  ?

## 2021-03-19 NOTE — Assessment & Plan Note (Signed)
Continue statin. 

## 2021-03-19 NOTE — Assessment & Plan Note (Signed)
History of recurrent chronic left sided pleural effusion.  Follows with pulmonology.  Currently on room air ?

## 2021-03-19 NOTE — Progress Notes (Signed)
PROGRESS NOTE  JOSHUA SOULIER  JSH:[id number] DOB: 1929-05-22 DOA: 03/14/2021 PCP: [name], MD   Brief Narrative:  MEHUL RUDIN is a 88 y.o. male with history of A-fib on Eliquis, HTN, chronic anemia, history of recurrent left pleural effusion requiring multiple thoracentesis was brought to the ER after patient had a fall at a restaurant. Patient lost his balance and fell onto the floor. Denies losing consciousness. In the ED, CT head and C-spine were unremarkable. X-rays revealed right hip fracture. Orthopedic surgeon was consulted.  Status post right hip hemiarthroplasty on 03/15/2021.  PT/OT recommending CIR/skilled nursing facility.  Insurance denied CIR.  Family appealing.  Medically stable for discharge as soon as possible  Assessment & Plan:  Principal Problem:   Hip fracture ([place]) Active Problems:   Aspiration pneumonia (HCC)   Pleural effusion   PAF (paroxysmal atrial fibrillation) (HCC)   HLD (hyperlipidemia)   Essential hypertension   Acute postoperative anemia due to expected blood loss   Assessment and Plan: * Hip fracture ([place]) Found to have displaced right femoral neck fracture after mechanical fall.  Status post right hip hemiarthroplasty on 03/15/2021.  Continue pain management, supportive care.  Continue bowel regimen.  PT/OT recommending CIR/skilled nursing facility.  Placement pending.  Aspiration pneumonia ([place]) During the hospitalization, he had fever, mild leukocytosis.  Chest x-ray showed mild right upper lobe airspace disease.  Aspiration pneumonia suspected.  Started on Unasyn which has been changed to Augmentin.  Speech therapy is following  Pleural effusion History of recurrent chronic left sided pleural effusion.  Follows with pulmonology.  Currently on room air  PAF (paroxysmal atrial fibrillation) (HCC) Rate controlled, normal sinus rhythm Not on any rate controlling meds Restarted Eliquis  Acute postoperative anemia due to expected blood  loss Hemoglobin currently in the range of 9.  Continue to monitor  Essential hypertension Currently blood pressure stable.  On amlodipine, clonidine, lisinopril, minoxidil  HLD (hyperlipidemia) Continue statin              DVT prophylaxis:SCDs Start: 03/15/21 1553 apixaban (ELIQUIS) tablet 5 mg     Code Status: Full Code  Family Communication: Daughter at bedside  Patient status: Inpatient  Patient is from : Home  Anticipated discharge to: CIR versus skilled nursing facility  Estimated DC date: As soon as bed is available   Consultants: Orthopedics  Procedures: ORIF  Antimicrobials:  Anti-infectives (From admission, onward)    Start     Dose/Rate Route Frequency Ordered Stop   03/19/21 1215  amoxicillin-clavulanate (AUGMENTIN) 875-125 MG per tablet 1 tablet        1 tablet Oral Every 12 hours 03/19/21 1123     03/18/21 1630  Ampicillin-Sulbactam (UNASYN) 3 g in sodium chloride 0.9 % 100 mL IVPB  Status:  Discontinued        3 g 200 mL/hr over 30 Minutes Intravenous Every 6 hours 03/18/21 1533 03/19/21 1123   03/15/21 1800  ceFAZolin (ANCEF) IVPB 2g/100 mL premix        2 g 200 mL/hr over 30 Minutes Intravenous Every 6 hours 03/15/21 1552 03/15/21 2342   03/15/21 1045  ceFAZolin (ANCEF) IVPB 2g/100 mL premix        2 g 200 mL/hr over 30 Minutes Intravenous On call to O.R. 03/15/21 1033 03/15/21 1215       Subjective: Patient seen and examined at the bedside this morning.  Hemodynamically stable.  Comfortable, sitting on the chair.  Pain well controlled.  On room  air.  Objective: Vitals:   03/18/21 2033 03/18/21 2323 03/19/21 0315 03/19/21 0808  BP: (!) 159/47 (!) 154/52 139/81 (!) 165/54  Pulse:    91  Resp: 14 12 18 16   Temp:  98.6 F (37 C) 98.9 F (37.2 C) 99.4 F (37.4 C)  TempSrc:  Oral Oral Oral  SpO2: 98% 98% 96% 99%  Weight:      Height:        Intake/Output Summary (Last 24 hours) at 03/19/2021 1143 Last data filed at 03/19/2021  0700 Gross per 24 hour  Intake 646.99 ml  Output 395 ml  Net 251.99 ml   Filed Weights   03/14/21 2053 03/15/21 1157  Weight: 66.2 kg 66.2 kg    Examination:  General exam: Overall comfortable, not in distress, pleasant elderly male HEENT: PERRL Respiratory system: Diminished air sounds on the left side Cardiovascular system: S1 & S2 heard, RRR.  Gastrointestinal system: Abdomen is nondistended, soft and nontender. Central nervous system: Alert and oriented Extremities: No edema, no clubbing ,no cyanosis, skin surgical wound on the right hip Skin: No rashes, no ulcers,no icterus     Data Reviewed: I have personally reviewed following labs and imaging studies  CBC: Recent Labs  Lab 03/14/21 2048 03/16/21 0221 03/17/21 0200 03/18/21 0215 03/19/21 0506  WBC 6.8 13.9* 10.4 10.8* 8.2  NEUTROABS 5.1 12.3* 7.9* 8.0* 6.1  HGB 12.0* 9.4* 9.0* 9.8* 9.2*  HCT 36.2* 28.7* 27.1* 29.5* 27.2*  MCV 90.0 90.0 89.7 89.7 88.9  PLT 258 207 196 239 568   Basic Metabolic Panel: Recent Labs  Lab 03/14/21 2048 03/17/21 0200 03/18/21 0215 03/19/21 0506  NA 139 137 136 137  K 4.7 4.4 4.7 4.5  CL 106 107 104 105  CO2 27 25 24 25   GLUCOSE 173* 103* 117* 105*  BUN 21 27* 24* 24*  CREATININE 1.19 1.17 1.05 1.07  CALCIUM 8.8* 8.0* 8.2* 8.0*     Recent Results (from the past 240 hour(s))  Resp Panel by RT-PCR (Flu A&B, Covid) Nasopharyngeal Swab     Status: None   Collection Time: 03/14/21  9:34 PM   Specimen: Nasopharyngeal Swab; Nasopharyngeal(NP) swabs in vial transport medium  Result Value Ref Range Status   SARS Coronavirus 2 by RT PCR NEGATIVE NEGATIVE Final    Comment: (NOTE) SARS-CoV-2 target nucleic acids are NOT DETECTED.  The SARS-CoV-2 RNA is generally detectable in upper respiratory specimens during the acute phase of infection. The lowest concentration of SARS-CoV-2 viral copies this assay can detect is 138 copies/mL. A negative result does not preclude  SARS-Cov-2 infection and should not be used as the sole basis for treatment or other patient management decisions. A negative result may occur with  improper specimen collection/handling, submission of specimen other than nasopharyngeal swab, presence of viral mutation(s) within the areas targeted by this assay, and inadequate number of viral copies(<138 copies/mL). A negative result must be combined with clinical observations, patient history, and epidemiological information. The expected result is Negative.  Fact Sheet for Patients:  EntrepreneurPulse.com.au  Fact Sheet for Healthcare Providers:  IncredibleEmployment.be  This test is no t yet approved or cleared by the [place] FDA and  has been authorized for detection and/or diagnosis of SARS-CoV-2 by FDA under an Emergency Use Authorization (EUA). This EUA will remain  in effect (meaning this test can be used) for the duration of the COVID-19 declaration under Section 564(b)(1) of the Act, 21 U.S.C.section 360bbb-3(b)(1), unless the authorization is terminated  or  revoked sooner.       Influenza A by PCR NEGATIVE NEGATIVE Final   Influenza B by PCR NEGATIVE NEGATIVE Final    Comment: (NOTE) The Xpert Xpress SARS-CoV-2/FLU/RSV plus assay is intended as an aid in the diagnosis of influenza from Nasopharyngeal swab specimens and should not be used as a sole basis for treatment. Nasal washings and aspirates are unacceptable for Xpert Xpress SARS-CoV-2/FLU/RSV testing.  Fact Sheet for Patients: EntrepreneurPulse.com.au  Fact Sheet for Healthcare Providers: IncredibleEmployment.be  This test is not yet approved or cleared by the [place] FDA and has been authorized for detection and/or diagnosis of SARS-CoV-2 by FDA under an Emergency Use Authorization (EUA). This EUA will remain in effect (meaning this test can be used) for the duration of  the COVID-19 declaration under Section 564(b)(1) of the Act, 21 U.S.C. section 360bbb-3(b)(1), unless the authorization is terminated or revoked.  Performed at [facility], [place] [address] [place], [place] [address]   Surgical pcr screen     Status: None   Collection Time: 03/15/21 11:49 AM   Specimen: Nasal Mucosa; Nasal Swab  Result Value Ref Range Status   MRSA, PCR NEGATIVE NEGATIVE Final   Staphylococcus aureus NEGATIVE NEGATIVE Final    Comment: (NOTE) The Xpert SA Assay (FDA approved for NASAL specimens in patients 34 years of age and older), is one component of a comprehensive surveillance program. It is not intended to diagnose infection nor to guide or monitor treatment. Performed at [facility], [place] [address] [place], [place] [address]   Urine Culture     Status: Abnormal (Preliminary result)   Collection Time: 03/18/21  7:57 AM   Specimen: Urine, Clean Catch  Result Value Ref Range Status   Specimen Description URINE, CLEAN CATCH  Final   Special Requests NONE  Final   Culture (A)  Final    >=100,000 COLONIES/mL PSEUDOMONAS AERUGINOSA SUSCEPTIBILITIES TO FOLLOW Performed at [facility], [place] [address], [place], [place] [address]    Report Status PENDING  Incomplete     Radiology Studies: DG Chest Port 1 View  Result Date: 03/18/2021 CLINICAL DATA:  Fever postop EXAM: PORTABLE CHEST 1 VIEW COMPARISON:  03/14/2021 FINDINGS: Stable large cardiac silhouette. Small LEFT effusion new from prior. Mild airspace disease in the RIGHT upper lobe increased from prior. No pneumothorax. IMPRESSION: 1. Increased mild RIGHT upper lobe airspace disease representing asymmetric edema versus pneumonia or aspiration pneumonitis. 2. Small LEFT effusion. Electronically Signed   By: Suzy Bouchard M.D.   On: 03/18/2021 08:50    Scheduled Meds:  amLODipine  10 mg Oral Daily   amoxicillin-clavulanate  1 tablet Oral Q12H   apixaban  5 mg Oral BID   atorvastatin   40 mg Oral Daily   lisinopril  2.5 mg Oral Daily   minoxidil  10 mg Oral BID   pantoprazole  40 mg Oral Daily   senna  1 tablet Oral BID   Zinc Oxide   Topical Q4H   Continuous Infusions:  methocarbamol (ROBAXIN) IV       LOS: 5 days   Shelly Coss, MD [facility] P3/08/2021, 11:43 AM

## 2021-03-19 NOTE — Assessment & Plan Note (Signed)
Found to have displaced right femoral neck fracture after mechanical fall.  Status post right hip hemiarthroplasty on 03/15/2021.  Continue pain management, supportive care.  Continue bowel regimen.  PT/OT recommending CIR/skilled nursing facility.  Placement pending. ?

## 2021-03-19 NOTE — Plan of Care (Signed)

## 2021-03-19 NOTE — Progress Notes (Signed)
Occupational Therapy Treatment ?Patient Details ?Name: [name] ?MRN: [id number] ?DOB: 12-31-1929 ?Today's Date: 03/19/2021 ? ? 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 ?  ?OT comments ? Pt requires mod - max A for LB ADLs, and min A for functional transfers.  He demonstrates improved activity tolerance and is progressing toward goals.  Continue to recommend AIR to maximize his independence with ADLs and reduce risk of falls, and readmission.  Will follow.   ? ?Recommendations for follow up therapy are one component of a multi-disciplinary discharge planning process, led by the attending physician.  Recommendations may be updated based on patient status, additional functional criteria and insurance authorization. ?   ?Follow Up Recommendations ? Acute inpatient rehab (3hours/day)  ?  ?Assistance Recommended at Discharge Frequent or constant Supervision/Assistance  ?Patient can return home with the following ? A lot of help with walking and/or transfers;A lot of help with bathing/dressing/bathroom ?  ?Equipment Recommendations ? BSC/3in1;Tub/shower bench  ?  ?Recommendations for Other Services Rehab consult ? ?  ?Precautions / Restrictions Precautions ?Precautions: Fall ?Precaution Comments: JP drain ?Restrictions ?Weight Bearing Restrictions: Yes ?RLE Weight Bearing: Weight bearing as tolerated  ? ? ?  ? ?Mobility Bed Mobility ?  ?  ?  ?  ?  ?  ?  ?  ?   ? ?Transfers ?Overall transfer level: Needs assistance ?Equipment used: Rolling walker (2 wheels) ?Transfers: Sit to/from Stand ?Sit to Stand: Min assist ?  ?  ?  ?  ?  ?  ?  ?  ?Balance Overall balance assessment: Needs assistance ?Sitting-balance support: Feet supported ?Sitting balance-Leahy Scale: Good ?  ?  ?  ?  ?  ?  ?  ?  ?  ?  ?  ?  ?  ?  ?  ?  ?   ? ?ADL either performed or assessed with clinical judgement  ? ?ADL Overall ADL's : Needs assistance/impaired ?  ?  ?  ?  ?  ?  ?  ?  ?  ?  ?Lower Body Dressing: Maximal assistance;Sit to/from stand ?Lower Body Dressing Details (indicate cue type and reason): pt able to reach toward mid shin, but unable to doff socks.  able to thread feet through pantleg with mod A ?Toilet Transfer: Minimal assistance;Stand-pivot;BSC/3in1;Rolling walker (2 wheels) ?  ?Toileting- Clothing Manipulation and Hygiene: Minimal assistance;Sit to/from stand ?  ?  ?  ?Functional mobility during ADLs: Minimal assistance;Rolling walker (2 wheels) ?  ?  ? ?Extremity/Trunk Assessment Upper Extremity Assessment ?Upper Extremity Assessment: Overall WFL for tasks assessed ?  ?Lower Extremity Assessment ?Lower Extremity Assessment: Defer to PT evaluation ?  ?  ?  ? ?Vision   ?  ?  ?Perception   ?  ?Praxis   ?  ? ?Cognition Arousal/Alertness: Awake/alert ?Behavior During Therapy: [facility] for tasks assessed/performed ?Overall Cognitive Status: Within Functional Limits for tasks assessed ?  ?  ?  ?  ?  ?  ?  ?  ?  ?  ?  ?  ?  ?  ?  ?  ?  ?  ?  ?   ?  Exercises Exercises: General Upper Extremity ?General Exercises - Upper Extremity ?Chair Push Up: Right, Left, Strengthening, 10 reps, Seated ? ?  ?Shoulder Instructions   ? ? ?  ?General Comments    ? ? ?Pertinent Vitals/ Pain       Pain Assessment ?Pain Assessment: Faces ?Faces Pain Scale: Hurts little more ?Pain Location: RLE ?Pain Descriptors / Indicators: Aching, Grimacing, Guarding, Operative site guarding ?Pain Intervention(s): Monitored during  session ? ?Home Living   ?  ?  ?  ?  ?  ?  ?  ?  ?  ?  ?  ?  ?  ?  ?  ?  ?  ?  ? ?  ?Prior Functioning/Environment    ?  ?  ?  ?   ? ?Frequency ? Min 2X/week  ? ? ? ? ?  ?Progress Toward Goals ? ?OT Goals(current goals can now be found in the care plan section) ? Progress towards OT goals: Progressing toward goals ? ?   ?Plan Discharge plan remains appropriate;Equipment recommendations need to be updated   ? ?Co-evaluation ? ? ?   ?  ?  ?  ?  ? ?  ?AM-PAC OT "6 Clicks" Daily Activity     ?Outcome Measure ? ? Help from another person eating meals?: A Little ?Help from another person taking care of personal grooming?: A Little ?Help from another person toileting, which includes using toliet, bedpan, or urinal?: A Little ?Help from another person bathing (including washing, rinsing, drying)?: A Lot ?Help from another person to put on and taking off regular upper body clothing?: A Little ?Help from another person to put on and taking off regular lower body clothing?: A Lot ?6 Click Score: 16 ? ?  ?End of Session Equipment Utilized During Treatment: Rolling walker (2 wheels);Gait belt ? ?OT Visit Diagnosis: Unsteadiness on feet (R26.81);Other abnormalities of gait and mobility (R26.89);Muscle weakness (generalized) (M62.81) ?  ?Activity Tolerance Patient tolerated treatment well ?  ?Patient Left in chair;with call bell/phone within reach;with chair alarm set ?  ?Nurse Communication Mobility status ?  ? ?   ? ?Time: 9518-8416 ?OT Time Calculation (min): 14 min ? ?Charges: OT General Charges ?$OT Visit: 1 Visit ?OT Treatments ?$Therapeutic Activity: 8-22 mins ? ?Tayquan Gassman C., OTR/L ?Acute Rehabilitation Services ?Pager [phone] ?Office [phone] ? ? ?Linsey Arteaga M ?03/19/2021, 2:11 PM ?

## 2021-03-19 NOTE — Assessment & Plan Note (Signed)
Currently blood pressure stable.  On amlodipine, clonidine, lisinopril, minoxidil ?

## 2021-03-19 NOTE — Assessment & Plan Note (Signed)
Rate controlled, normal sinus rhythm ?Not on any rate controlling meds ?Restarted Eliquis ?

## 2021-03-19 NOTE — Progress Notes (Signed)
Mobility Specialist Progress Note  ? ? 03/19/21 1316  ?Mobility  ?Activity Ambulated with assistance in hallway  ?Level of Assistance Minimal assist, patient does 75% or more  ?Assistive Device Front wheel walker  ?Distance Ambulated (ft) 100 ft  ?Activity Response Tolerated well  ?$Mobility charge 1 Mobility  ? ?Pt received in chair and agreeable. No complaints. Returned to chair with call bell in reach.  ? ?Hildred Alamin ?Mobility Specialist  ?M.S. 5N: [phone]  ?

## 2021-03-19 NOTE — Plan of Care (Signed)

## 2021-03-19 NOTE — [place] FL2 (Signed)
?[place] MEDICAID FL2 LEVEL OF CARE SCREENING TOOL  ?  ? ?IDENTIFICATION  ?Patient Name: ?[name] Birthdate: [birth year] Sex: male Admission Date (Current Location): ?03/14/2021  ?[place] and [place] Number: ? [place] ?  Facility and Address:  ?[facility]. [facility], [place] [address], [place], [place] [address] ?     Provider Number: ?[id number]  ?Attending Physician Name and Address:  ?Shelly Coss, MD ? Relative Name and Phone Number:  ?Cooke,Scarlet Daughter   [phone] ?   ?Current Level of Care: ?Hospital Recommended Level of Care: ?[place] Prior Approval Number: ?  ? ?Date Approved/Denied: ?  PASRR Number: ?[id number] A ? ?Discharge Plan: ?SNF ?  ? ?Current Diagnoses: ?Patient Active Problem List  ? Diagnosis Date Noted  ? PAF (paroxysmal atrial fibrillation) ([place]) 03/14/2021  ? Closed fracture of neck of right femur ([place])   ? Chest tube in place 12/24/2020  ? Abnormal positron emission tomography (PET) scan 10/01/2020  ? Former smoker 08/30/2020  ? 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 S/P thoracentesis   ? Thyroid nodule 07/17/2020  ? Pleural effusion 07/17/2020  ? Loss of weight 12/01/2019  ? Chronic nausea 12/01/2019  ? Cellulitis 11/29/2019  ? Hypoalbuminemia 05/29/2019  ? Hip pain 05/29/2019  ? Pedal edema 05/29/2019  ? Controlled type 2 diabetes mellitus with hyperglycemia, without long-term current use of insulin ([place])   ? Labile blood glucose   ? Postoperative pain   ? Closed left hip fracture, sequela 10/29/2018  ? Diabetic peripheral neuropathy ([place])   ? Essential hypertension   ? Chronic diastolic congestive heart failure ([place])   ? History of left hip hemiarthroplasty 10/28/2018  ? Femur fracture, left ([place]) 10/27/2018  ? Hip fracture ([place]) 10/23/2018  ? Hip fracture, left ([place]) 10/23/2018  ? Ankle pain 01/13/2018  ? Neck pain 01/13/2018  ? Constipation 01/11/2018  ? Low back pain 01/11/2018  ? Dysuria 12/06/2016  ? Weakness 12/06/2016  ? 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 Preventative health care 12/06/2016  ? Epigastric pain 04/05/2016  ? Spondylosis, cervical, with myelopathy 11/26/2015  ? Cervicogenic headache 11/26/2015  ? Umbilical hernia 35/00/9381  ? Asthma 10/08/2011  ? Carotid artery disease ([place]) 03/31/2011  ? Chronic pain   ? Vitamin D deficiency 12/11/2008  ? HLD (hyperlipidemia) 12/11/2008  ? Anemia 12/11/2008  ? Cerebral artery occlusion with cerebral infarction ([place]) 12/11/2008  ? URINARY INCONTINENCE, MALE 12/11/2008  ? Disorder of bursae and tendons in shoulder region 12/29/2007  ? ANXIETY, MILD 08/09/2007  ? Allergic rhinitis 01/04/2007  ? HEADACHE, TENSION 11/05/2006  ? PROSTATE CANCER, HX OF 11/05/2006  ? ? ?Orientation RESPIRATION BLADDER Height & Weight   ?  ?Self, Time, Situation, Place ? Normal Continent Weight: 146 lb (66.2 kg) ?Height:  5' 10.98" (180.3 cm)  ?BEHAVIORAL SYMPTOMS/MOOD NEUROLOGICAL BOWEL NUTRITION STATUS  ?    Continent Diet (see discharge summary)  ?AMBULATORY STATUS COMMUNICATION OF NEEDS Skin   ?Limited Assist Verbally Surgical wounds ?  ?  ?  ?    ?     ?     ? ? ?Personal Care Assistance Level of Assistance  ?Bathing, Feeding, Dressing Bathing Assistance: Limited assistance ?Feeding assistance: Independent ?Dressing Assistance: Limited assistance ?   ? ?Functional Limitations Info  ?Sight, Hearing, Speech Sight Info: Adequate ?Hearing Info: Adequate ?Speech Info: Adequate  ? ? ?SPECIAL CARE FACTORS FREQUENCY  ?PT (By licensed PT), OT (By licensed OT)   ?  ?PT Frequency: 5x week ?OT  Frequency: 5x week ?  ?  ?  ?   ? ? ?Contractures Contractures Info: Not present  ? ? ?Additional Factors Info  ?Code Status, Allergies Code Status Info: full ?Allergies Info: Hctz (Hydrochlorothiazide), Hydralazine, Diovan (Valsartan), Metformin And Related, Nsaids, Other, Prednisone, Spironolactone, Codeine, Oxycodone-acetaminophen ?  ?  ?  ?   ? ?Current Medications (03/19/2021):  This is the current hospital active medication  list ?Current Facility-Administered Medications  ?Medication Dose Route Frequency Provider Last Rate Last Admin  ? acetaminophen (TYLENOL) tablet 325-650 mg  325-650 mg Oral Q6H PRN Rod Can, MD   650 mg at 03/18/21 0801  ? albuterol (PROVENTIL) (2.5 MG/3ML) 0.083% nebulizer solution 3 mL  3 mL Inhalation Q6H PRN Swinteck, Aaron Edelman, MD      ? amLODipine (NORVASC) tablet 10 mg  10 mg Oral Daily Rod Can, MD   10 mg at 03/19/21 9924  ? Ampicillin-Sulbactam (UNASYN) 3 g in sodium chloride 0.9 % 100 mL IVPB  3 g Intravenous Q6H Wendee Beavers, RPH 200 mL/hr at 03/19/21 0831 3 g at 03/19/21 0831  ? apixaban (ELIQUIS) tablet 5 mg  5 mg Oral BID Hammons, Kimberly B, RPH   5 mg at 03/19/21 2683  ? atorvastatin (LIPITOR) tablet 40 mg  40 mg Oral Daily Rod Can, MD   40 mg at 03/19/21 4196  ? HYDROcodone-acetaminophen (NORCO/VICODIN) 5-325 MG per tablet 1 tablet  1 tablet Oral Q6H PRN Alma Friendly, MD   1 tablet at 03/19/21 2229  ? labetalol (NORMODYNE) injection 5 mg  5 mg Intravenous Q2H PRN Rod Can, MD   5 mg at 03/15/21 0536  ? lisinopril (ZESTRIL) tablet 2.5 mg  2.5 mg Oral Daily Swinteck, Aaron Edelman, MD   2.5 mg at 03/19/21 7989  ? magnesium hydroxide (MILK OF MAGNESIA) suspension 30 mL  30 mL Oral Daily PRN Alma Friendly, MD   30 mL at 03/18/21 1122  ? menthol-cetylpyridinium (CEPACOL) lozenge 3 mg  1 lozenge Oral PRN Swinteck, Aaron Edelman, MD      ? Or  ? phenol (CHLORASEPTIC) mouth spray 1 spray  1 spray Mouth/Throat PRN Swinteck, Aaron Edelman, MD      ? methocarbamol (ROBAXIN) tablet 500 mg  500 mg Oral Q6H PRN Rod Can, MD   500 mg at 03/18/21 1843  ? Or  ? methocarbamol (ROBAXIN) 500 mg in dextrose 5 % 50 mL IVPB  500 mg Intravenous Q6H PRN Swinteck, Aaron Edelman, MD      ? metoCLOPramide (REGLAN) tablet 5-10 mg  5-10 mg Oral Q8H PRN Swinteck, Aaron Edelman, MD      ? Or  ? metoCLOPramide (REGLAN) injection 5-10 mg  5-10 mg Intravenous Q8H PRN Swinteck, Aaron Edelman, MD      ? minoxidil (LONITEN) tablet 10  mg  10 mg Oral BID Rod Can, MD   10 mg at 03/19/21 0820  ? morphine (PF) 2 MG/ML injection 0.5-1 mg  0.5-1 mg Intravenous Q2H PRN Rod Can, MD   0.5 mg at 03/19/21 2119  ? ondansetron (ZOFRAN) tablet 4 mg  4 mg Oral Q6H PRN Rod Can, MD   4 mg at 03/17/21 1426  ? Or  ? ondansetron (ZOFRAN) injection 4 mg  4 mg Intravenous Q6H PRN Swinteck, Aaron Edelman, MD      ? pantoprazole (PROTONIX) EC tablet 40 mg  40 mg Oral Daily Alma Friendly, MD   40 mg at 03/19/21 4174  ? polyethylene glycol (MIRALAX / GLYCOLAX) packet 17 g  17 g Oral  Daily PRN Rod Can, MD   17 g at 03/18/21 1122  ? senna (SENOKOT) tablet 8.6 mg  1 tablet Oral BID Rod Can, MD   8.6 mg at 03/19/21 0518  ? Zinc Oxide (TRIPLE PASTE) 12.8 % ointment   Topical Q4H Alma Friendly, MD   Given at 03/18/21 1125  ? ? ? ?Discharge Medications: ?Please see discharge summary for a list of discharge medications. ? ?Relevant Imaging Results: ? ?Relevant Lab Results: ? ? ?Additional Information ?SSN: [id number].  Pt is vaccinated for covid with 1-2 boosters. ? ?Joanne Chars, LCSW ? ? ? ? ?

## 2021-03-19 NOTE — Assessment & Plan Note (Addendum)
During the hospitalization, he had fever, mild leukocytosis.  Chest x-ray showed mild right upper lobe airspace disease.  Aspiration pneumonia suspected.  Started on Unasyn which has been changed to Augmentin.  Speech therapy is following ?

## 2021-03-19 NOTE — Assessment & Plan Note (Signed)
Hemoglobin currently in the range of 9.  Continue to monitor ?

## 2021-03-20 LAB — CBC WITH DIFFERENTIAL/PLATELET
Abs Immature Granulocytes: 0.02 10*3/uL (ref 0.00–0.07)
Basophils Absolute: 0.1 10*3/uL (ref 0.0–0.1)
Basophils Relative: 1 %
Eosinophils Absolute: 0.4 10*3/uL (ref 0.0–0.5)
Eosinophils Relative: 5 %
HCT: 29 % — ABNORMAL LOW (ref 39.0–52.0)
Hemoglobin: 9.7 g/dL — ABNORMAL LOW (ref 13.0–17.0)
Immature Granulocytes: 0 %
Lymphocytes Relative: 11 %
Lymphs Abs: 0.8 10*3/uL (ref 0.7–4.0)
MCH: 29.8 pg (ref 26.0–34.0)
MCHC: 33.4 g/dL (ref 30.0–36.0)
MCV: 89 fL (ref 80.0–100.0)
Monocytes Absolute: 0.9 10*3/uL (ref 0.1–1.0)
Monocytes Relative: 13 %
Neutro Abs: 5.1 10*3/uL (ref 1.7–7.7)
Neutrophils Relative %: 70 %
Platelets: 272 10*3/uL (ref 150–400)
RBC: 3.26 MIL/uL — ABNORMAL LOW (ref 4.22–5.81)
RDW: 13.5 % (ref 11.5–15.5)
WBC: 7.3 10*3/uL (ref 4.0–10.5)
nRBC: 0 % (ref 0.0–0.2)

## 2021-03-20 LAB — BASIC METABOLIC PANEL
Anion gap: 7 (ref 5–15)
BUN: 23 mg/dL (ref 8–23)
CO2: 25 mmol/L (ref 22–32)
Calcium: 8.2 mg/dL — ABNORMAL LOW (ref 8.9–10.3)
Chloride: 107 mmol/L (ref 98–111)
Creatinine, Ser: 0.98 mg/dL (ref 0.61–1.24)
GFR, Estimated: >60 mL/min (ref >60)
Glucose, Bld: 109 mg/dL — ABNORMAL HIGH (ref 70–99)
Potassium: 4.6 mmol/L (ref 3.5–5.1)
Sodium: 139 mmol/L (ref 135–145)

## 2021-03-20 LAB — URINE CULTURE: Culture: >=100000 — AB

## 2021-03-20 NOTE — Progress Notes (Signed)
Mobility Specialist Progress Note  ? ? 03/20/21 1608  ?Mobility  ?Activity Ambulated with assistance in hallway  ?Level of Assistance Minimal assist, patient does 75% or more  ?Assistive Device Front wheel walker  ?RLE Weight Bearing WBAT  ?Distance Ambulated (ft) 140 ft  ?Activity Response Tolerated well  ?$Mobility charge 1 Mobility  ? ?Pt received in chair and agreeable. No complaints. Returned to chair with call bell in reach.  ? ?Hildred Alamin ?Mobility Specialist  ?M.S. 5N: [phone]  ?

## 2021-03-20 NOTE — Progress Notes (Signed)
OT Cancellation Note ? ?Patient Details ?Name: [name] ?MRN: [id number] ?DOB: Feb 01, 1929 ? ? ?Cancelled Treatment:    Reason Eval/Treat Not Completed: Fatigue/lethargy limiting ability to participate.  Pt request OT return later. ? ?[name] C., OTR/L ?Acute Rehabilitation Services ?Pager [phone] ?Office [phone] ? ? ?[name] ?03/20/2021, 12:12 PM ?

## 2021-03-20 NOTE — Progress Notes (Signed)
Inpatient Rehabilitation Admissions Coordinator  ? ?I was notified that daughter began appeal for CIR on 3/8. We await determination for CIR vs home with HH. ? ?Danne Baxter, RN, MSN ?Rehab Admissions Coordinator ?([phone] ?03/20/2021 11:42 AM ? ?

## 2021-03-20 NOTE — Progress Notes (Signed)
Physical Therapy Treatment ?Patient Details ?Name: [name] ?MRN: [id number] ?DOB: [birth year] ?Today's Date: 03/20/2021 ? ? 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 ? ?  ?PT Comments  ? ? Pt making slow steady progress towards goals requiring grossly min assist for transfers and gait. Pt with very narrow base of support with pt stepping on R foot with L for multiple occurances. Cues to widen BOS, take longer steps and to elevate trunk with pt able to correct slightly but unable to maintain. Pt now stating he wants to go home at d/c, discussed safety with ambulation at home, car transfers, carrying items in bag not in hands (d/t fall risk). Pt stating he does have stairs to enter home but defered stair negotiation this session secondary to pain. Current plan continues to remain appropriate to address deficits and maximize functional independence and decrease caregiver burden.Pt continues to benefit from skilled PT services to progress toward functional mobility goals.  ? ?  ?Recommendations for follow up therapy are one component of a multi-disciplinary discharge planning process, led by the attending physician.  Recommendations may be updated based on patient status, additional functional criteria and insurance authorization. ? ?Follow Up Recommendations ? Skilled nursing-short term rehab (<3 hours/day) ?  ?  ?Assistance Recommended at Discharge Set up  Supervision/Assistance  ?Patient can return home with the following A little help with walking and/or transfers;A little help with bathing/dressing/bathroom;Assistance with cooking/housework;Help with stairs or ramp for entrance ?  ?Equipment Recommendations ? Rolling walker (2 wheels)  ?  ?Recommendations for Other Services OT consult ? ? ?  ?Precautions / Restrictions Precautions ?Precautions: Fall ?Precaution Comments: JP drain ?Restrictions ?Weight Bearing Restrictions: Yes ?RLE Weight Bearing: Weight bearing as tolerated  ?  ? ?Mobility ? Bed Mobility ?Overal bed mobility: Needs Assistance ?Bed Mobility: Supine to Sit ?  ?  ?Supine to sit: Min assist ?  ?  ?General bed mobility comments: min HHA to elevate trunk ?  ? ?Transfers ?Overall transfer level: Needs assistance ?Equipment used: Rolling walker (2 wheels) ?Transfers: Sit to/from Stand ?Sit to Stand: Min assist ?  ?  ?  ?  ?  ?General transfer comment: min assist to steady after coming to standing, cues for safe hand placement ?  ? ?Ambulation/Gait ?Ambulation/Gait assistance: Min guard, Min assist ?Gait Distance (Feet): 75 Feet (15+60 EOB to BR, seated, BR to hall) ?Assistive device: Rolling walker (2 wheels) ?Gait Pattern/deviations: Step-through pattern, Decreased step length - right, Decreased step length - left, Trunk flexed, Shuffle, Scissoring, Narrow base of support ?Gait velocity: decr ?  ?  ?General Gait Details: slow with very narrow base of support with pt stepping on R foot with L for multiple occurances. Cues to widen BOS, take longer steps and to elevate trunk with pt able to correct slightly but unable to maintain. ? ? ?Stairs ?  ?  ?  ?  ?  ? ? ?Wheelchair Mobility ?  ? ?Modified Rankin (Stroke Patients Only) ?  ? ? ?  ?Balance Overall balance assessment: Needs assistance ?Sitting-balance support: Feet supported ?Sitting balance-Leahy Scale: Good ?  ?  ?  Standing balance support: Reliant on assistive device for balance ?Standing  balance-Leahy Scale: Poor ?  ?  ?  ?  ?  ?  ?  ?  ?  ?  ?  ?  ?  ? ?  ?Cognition Arousal/Alertness: Awake/alert ?Behavior During Therapy: [facility] for tasks assessed/performed ?Overall Cognitive Status: Within Functional Limits for tasks assessed ?  ?  ?  ?  ?  ?  ?  ?  ?  ?  ?  ?  ?  ?  ?  ?  ?  ?  ?  ? ?  ?Exercises   ? ?  ?General Comments General comments (skin integrity, edema, etc.): Pt now stating he wants to go home, discussed safety with ambulation at home, car transfers, carrying items in bag not in hands (d/t fall risk). Pt stating he does have stairs to enter home but defered stair negotiation this session secondary to pain. ?  ?  ? ?Pertinent Vitals/Pain Pain Assessment ?Pain Assessment: Faces ?Faces Pain Scale: Hurts even more ?Pain Location: RLE on standing ?Pain Descriptors / Indicators: Aching, Grimacing, Guarding, Operative site guarding ?Pain Intervention(s): Limited activity within patient's tolerance, Monitored during session, Repositioned, Patient requesting pain meds-RN notified  ? ? ?Home Living   ?  ?  ?  ?  ?  ?  ?  ?  ?  ?   ?  ?Prior Function    ?  ?  ?   ? ?PT Goals (current goals can now be found in the care plan section) Acute Rehab PT Goals ?PT Goal Formulation: With patient ?Time For Goal Achievement: 03/30/21 ? ?  ?Frequency ? ? ? Min 4X/week ? ? ? ?  ?PT Plan    ? ? ?Co-evaluation   ?  ?  ?  ?  ? ?  ?AM-PAC PT "6 Clicks" Mobility   ?Outcome Measure ? Help needed turning from your back to your side while in a flat bed without using bedrails?: A Little ?Help needed moving from lying on your back to sitting on the side of a flat bed without using bedrails?: A Little ?Help needed moving to and from a bed to a chair (including a wheelchair)?: A Little ?Help needed standing up from a chair using your arms (e.g., wheelchair or bedside chair)?: A Little ?Help needed to walk in hospital room?: A Little ?Help needed climbing 3-5 steps with a railing? : Total ?6 Click Score: 16 ? ?  ?End of  Session Equipment Utilized During Treatment: Gait belt ?Activity Tolerance: Patient tolerated treatment well;Patient limited by pain ?Patient left: in chair;with call bell/phone within reach ?Nurse Communication: Mobility status;Patient requests pain meds ?PT Visit Diagnosis: Unsteadiness on feet (R26.81);History of falling (Z91.81);Pain;Other abnormalities of gait and mobility (R26.89) ?  ? ? ?Time: 9712794239 ?PT Time Calculation (min) (ACUTE ONLY): 21 min ? ?Charges:  $Gait Training: 8-22 mins          ?          ? ?Lenora Boys. PTA ?Acute Rehabilitation Services ?Office: [phone] ? ? ? ?Marlana Salvage Lary Eckardt ?03/20/2021, 9:47 AM ? ?

## 2021-03-20 NOTE — Progress Notes (Signed)
PROGRESS NOTE  [name]  OZH:[id number] DOB: 11-02-1929 DOA: 03/14/2021 PCP: [name], MD   Brief Narrative:  [name] is a 88 y.o. male with history of A-fib on Eliquis, HTN, chronic anemia, history of recurrent left pleural effusion requiring multiple thoracentesis was brought to the ER after patient had a fall at a restaurant. Patient lost his balance and fell onto the floor. Denies losing consciousness. In the ED, CT head and C-spine were unremarkable. X-rays revealed right hip fracture. Orthopedic surgeon was consulted.  Status post right hip hemiarthroplasty on 03/15/2021.  PT/OT recommending CIR/skilled nursing facility.  Insurance denied CIR.  Family appealing.  Medically stable for discharge as soon as possible  Assessment & Plan:  Principal Problem:   Hip fracture ([place]) Active Problems:   Aspiration pneumonia (HCC)   Pleural effusion   PAF (paroxysmal atrial fibrillation) (HCC)   HLD (hyperlipidemia)   Essential hypertension   Acute postoperative anemia due to expected blood loss   Assessment and Plan: * Hip fracture ([place]) Found to have displaced right femoral neck fracture after mechanical fall.  Status post right hip hemiarthroplasty on 03/15/2021.  Continue pain management, supportive care.  Continue bowel regimen.  PT/OT recommending CIR/skilled nursing facility.  Placement pending.  Aspiration pneumonia ([place]) During the hospitalization, he had fever, mild leukocytosis.  Chest x-ray showed mild right upper lobe airspace disease.  Aspiration pneumonia suspected.  Started on Unasyn which has been changed to Augmentin.  Speech therapy is following  Pleural effusion History of recurrent chronic left sided pleural effusion.  Follows with pulmonology.  Currently on room air  PAF (paroxysmal atrial fibrillation) (HCC) Rate controlled, normal sinus rhythm Not on any rate controlling meds Restarted Eliquis  Acute postoperative anemia due to expected blood  loss Hemoglobin currently in the range of 9.  Continue to monitor  Essential hypertension Currently blood pressure stable.  On amlodipine, clonidine, lisinopril, minoxidil  HLD (hyperlipidemia) Continue statin              DVT prophylaxis:SCDs Start: 03/15/21 1553 apixaban (ELIQUIS) tablet 5 mg     Code Status: Full Code  Family Communication: Daughter at bedside on 3/8  Patient status: Inpatient  Patient is from : Home  Anticipated discharge to: CIR versus Home with [facility]  Estimated DC date: As soon as bed is available   Consultants: Orthopedics  Procedures: ORIF  Antimicrobials:  Anti-infectives (From admission, onward)    Start     Dose/Rate Route Frequency Ordered Stop   03/19/21 1215  amoxicillin-clavulanate (AUGMENTIN) 875-125 MG per tablet 1 tablet        1 tablet Oral Every 12 hours 03/19/21 1123     03/18/21 1630  Ampicillin-Sulbactam (UNASYN) 3 g in sodium chloride 0.9 % 100 mL IVPB  Status:  Discontinued        3 g 200 mL/hr over 30 Minutes Intravenous Every 6 hours 03/18/21 1533 03/19/21 1123   03/15/21 1800  ceFAZolin (ANCEF) IVPB 2g/100 mL premix        2 g 200 mL/hr over 30 Minutes Intravenous Every 6 hours 03/15/21 1552 03/15/21 2342   03/15/21 1045  ceFAZolin (ANCEF) IVPB 2g/100 mL premix        2 g 200 mL/hr over 30 Minutes Intravenous On call to O.R. 03/15/21 1033 03/15/21 1215       Subjective: Patient seen and examined at the bedside this morning.  Hemodynamically stable.  Comfortable without any complaints today.  Objective: Vitals:  03/19/21 1604 03/19/21 1928 03/20/21 0455 03/20/21 0810  BP: (!) 165/47 (!) 154/48 (!) 153/73 (!) 166/64  Pulse: 83 87 83 85  Resp: 19 16 16 20   Temp: 98.9 F (37.2 C) 98.9 F (37.2 C) 98.5 F (36.9 C) 98.2 F (36.8 C)  TempSrc: Oral Oral    SpO2: 100% 99% 98% 96%  Weight:      Height:        Intake/Output Summary (Last 24 hours) at 03/20/2021 1322 Last data filed at 03/20/2021 1200 Gross per  24 hour  Intake 240 ml  Output 330 ml  Net -90 ml   Filed Weights   03/14/21 2053 03/15/21 1157  Weight: 66.2 kg 66.2 kg    Examination:   General exam: Overall comfortable, not in distress HEENT: PERRL Respiratory system: Diminished air sounds on the left side Cardiovascular system: S1 & S2 heard, RRR.  Gastrointestinal system: Abdomen is nondistended, soft and nontender. Central nervous system: Alert and oriented Extremities: No edema, no clubbing ,no cyanosis, clean surgical wound on the right hip Skin: No rashes, no ulcers,no icterus     Data Reviewed: I have personally reviewed following labs and imaging studies  CBC: Recent Labs  Lab 03/16/21 0221 03/17/21 0200 03/18/21 0215 03/19/21 0506 03/20/21 0110  WBC 13.9* 10.4 10.8* 8.2 7.3  NEUTROABS 12.3* 7.9* 8.0* 6.1 5.1  HGB 9.4* 9.0* 9.8* 9.2* 9.7*  HCT 28.7* 27.1* 29.5* 27.2* 29.0*  MCV 90.0 89.7 89.7 88.9 89.0  PLT 207 196 239 243 759   Basic Metabolic Panel: Recent Labs  Lab 03/14/21 2048 03/17/21 0200 03/18/21 0215 03/19/21 0506 03/20/21 0110  NA 139 137 136 137 139  K 4.7 4.4 4.7 4.5 4.6  CL 106 107 104 105 107  CO2 27 25 24 25 25   GLUCOSE 173* 103* 117* 105* 109*  BUN 21 27* 24* 24* 23  CREATININE 1.19 1.17 1.05 1.07 0.98  CALCIUM 8.8* 8.0* 8.2* 8.0* 8.2*     Recent Results (from the past 240 hour(s))  Resp Panel by RT-PCR (Flu A&B, Covid) Nasopharyngeal Swab     Status: None   Collection Time: 03/14/21  9:34 PM   Specimen: Nasopharyngeal Swab; Nasopharyngeal(NP) swabs in vial transport medium  Result Value Ref Range Status   SARS Coronavirus 2 by RT PCR NEGATIVE NEGATIVE Final    Comment: (NOTE) SARS-CoV-2 target nucleic acids are NOT DETECTED.  The SARS-CoV-2 RNA is generally detectable in upper respiratory specimens during the acute phase of infection. The lowest concentration of SARS-CoV-2 viral copies this assay can detect is 138 copies/mL. A negative result does not preclude  SARS-Cov-2 infection and should not be used as the sole basis for treatment or other patient management decisions. A negative result may occur with  improper specimen collection/handling, submission of specimen other than nasopharyngeal swab, presence of viral mutation(s) within the areas targeted by this assay, and inadequate number of viral copies(<138 copies/mL). A negative result must be combined with clinical observations, patient history, and epidemiological information. The expected result is Negative.  Fact Sheet for Patients:  EntrepreneurPulse.com.au  Fact Sheet for Healthcare Providers:  IncredibleEmployment.be  This test is no t yet approved or cleared by the [place] FDA and  has been authorized for detection and/or diagnosis of SARS-CoV-2 by FDA under an Emergency Use Authorization (EUA). This EUA will remain  in effect (meaning this test can be used) for the duration of the COVID-19 declaration under Section 564(b)(1) of the Act, 21 U.S.C.section 360bbb-3(b)(1), unless  the authorization is terminated  or revoked sooner.       Influenza A by PCR NEGATIVE NEGATIVE Final   Influenza B by PCR NEGATIVE NEGATIVE Final    Comment: (NOTE) The Xpert Xpress SARS-CoV-2/FLU/RSV plus assay is intended as an aid in the diagnosis of influenza from Nasopharyngeal swab specimens and should not be used as a sole basis for treatment. Nasal washings and aspirates are unacceptable for Xpert Xpress SARS-CoV-2/FLU/RSV testing.  Fact Sheet for Patients: EntrepreneurPulse.com.au  Fact Sheet for Healthcare Providers: IncredibleEmployment.be  This test is not yet approved or cleared by the [place] FDA and has been authorized for detection and/or diagnosis of SARS-CoV-2 by FDA under an Emergency Use Authorization (EUA). This EUA will remain in effect (meaning this test can be used) for the duration of  the COVID-19 declaration under Section 564(b)(1) of the Act, 21 U.S.C. section 360bbb-3(b)(1), unless the authorization is terminated or revoked.  Performed at [facility], [place] [address], [place], [place] [address]   Surgical pcr screen     Status: None   Collection Time: 03/15/21 11:49 AM   Specimen: Nasal Mucosa; Nasal Swab  Result Value Ref Range Status   MRSA, PCR NEGATIVE NEGATIVE Final   Staphylococcus aureus NEGATIVE NEGATIVE Final    Comment: (NOTE) The Xpert SA Assay (FDA approved for NASAL specimens in patients 66 years of age and older), is one component of a comprehensive surveillance program. It is not intended to diagnose infection nor to guide or monitor treatment. Performed at [facility], [place] [address] [place], [place] [address]   Urine Culture     Status: Abnormal   Collection Time: 03/18/21  7:57 AM   Specimen: Urine, Clean Catch  Result Value Ref Range Status   Specimen Description URINE, CLEAN CATCH  Final   Special Requests   Final    NONE Performed at [facility], [place] [address], [place], [place] [address]    Culture >=100,000 COLONIES/mL PSEUDOMONAS AERUGINOSA (A)  Final   Report Status 03/20/2021 FINAL  Final   Organism ID, Bacteria PSEUDOMONAS AERUGINOSA (A)  Final      Susceptibility   Pseudomonas aeruginosa - MIC*    CEFTAZIDIME <=1 SENSITIVE Sensitive     CIPROFLOXACIN <=0.25 SENSITIVE Sensitive     GENTAMICIN <=1 SENSITIVE Sensitive     IMIPENEM 1 SENSITIVE Sensitive     PIP/TAZO 8 SENSITIVE Sensitive     CEFEPIME 2 SENSITIVE Sensitive     * >=100,000 COLONIES/mL PSEUDOMONAS AERUGINOSA     Radiology Studies: No results found.  Scheduled Meds:  amLODipine  10 mg Oral Daily   amoxicillin-clavulanate  1 tablet Oral Q12H   apixaban  5 mg Oral BID   atorvastatin  40 mg Oral Daily   lisinopril  2.5 mg Oral Daily   minoxidil  10 mg Oral BID   pantoprazole  40 mg Oral Daily   senna  1 tablet Oral BID   Zinc Oxide    Topical Q4H   Continuous Infusions:  methocarbamol (ROBAXIN) IV       LOS: 6 days   Shelly Coss, MD [facility] P3/09/2021, 1:22 PM

## 2021-03-20 NOTE — Progress Notes (Signed)
Occupational Therapy Treatment ?Patient Details ?Name: [name] ?MRN: [id number] ?DOB: 1929/07/30 ?Today's Date: 03/20/2021 ? ? 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 ?  ?OT comments ? Pt demonstrates improving activity tolerance, and improving ability to perform ADLs  - requires Min A, and min A for functional mobility.   ? ?Recommendations for follow up therapy are one component of a multi-disciplinary discharge planning process, led by the attending physician.  Recommendations may be updated based on patient status, additional functional criteria and insurance authorization. ?   ?Follow Up Recommendations ? Acute inpatient rehab (3hours/day)  ?  ?Assistance Recommended at Discharge Frequent or constant Supervision/Assistance  ?Patient can return home with the following ? A little help with bathing/dressing/bathroom;A little help with walking and/or transfers;Assist for transportation;Help with stairs or ramp for entrance;Assistance with cooking/housework ?  ?Equipment Recommendations ? BSC/3in1;Tub/shower bench  ?  ?Recommendations for Other Services   ? ?  ?Precautions / Restrictions Precautions ?Precautions: Fall ?Precaution Comments: JP drain ?Restrictions ?Weight Bearing Restrictions: Yes ?RLE Weight Bearing: Weight bearing as tolerated  ? ? ?  ? ?Mobility Bed Mobility ?  ?  ?  ?  ?  ?  ?  ?  ?  ? ?Transfers ?  ?  ?  ?  ?  ?  ?  ?  ?  ?  ?  ?  ?Balance  Overall balance assessment: Needs assistance ?Sitting-balance support: Feet supported ?Sitting balance-Leahy Scale: Good ?  ?  ?Standing balance support: During functional activity, No upper extremity supported ?Standing balance-Leahy Scale: Fair ?  ?  ?  ?  ?  ?  ?  ?  ?  ?  ?  ?  ?   ? ?ADL either performed or assessed with clinical judgement  ? ?ADL Overall ADL's : Needs assistance/impaired ?  ?  ?Grooming: Wash/dry hands;Wash/dry face;Oral care;Brushing hair;Min guard;Standing ?  ?  ?  ?Lower Body Bathing: Minimal assistance;Sit to/from stand ?Lower Body Bathing Details (indicate cue type and reason): able to bend forward to access ankles ?  ?  ?Lower Body Dressing: Minimal assistance;Sit to/from stand ?Lower Body Dressing Details (indicate cue type and reason): requires assist to don Rt sock ?Toilet Transfer: Minimal assistance;Stand-pivot;BSC/3in1;Rolling walker (2 wheels) ?  ?Toileting- Clothing Manipulation and Hygiene: Minimal assistance;Sit to/from stand ?  ?  ?  ?Functional mobility during ADLs: Minimal assistance;Rolling walker (2 wheels) ?  ?  ? ?Extremity/Trunk Assessment Upper Extremity Assessment ?Upper Extremity Assessment: Overall WFL for tasks assessed ?  ?Lower Extremity Assessment ?Lower Extremity Assessment: Defer to PT evaluation ?  ?  ?  ? ?Vision   ?Vision Assessment?: No apparent visual deficits ?  ?Perception   ?  ?Praxis   ?  ? ?Cognition Arousal/Alertness: Awake/alert ?Behavior During Therapy: [facility] for tasks assessed/performed ?Overall Cognitive Status: Within Functional Limits for tasks assessed ?  ?  ?  ?  ?  ?  ?  ?  ?  ?  ?  ?  ?  ?  ?  ?  ?  ?  ?  ?   ?Exercises   ? ?  ?  Shoulder Instructions   ? ? ?  ?General Comments    ? ? ?Pertinent Vitals/ Pain       Pain Assessment ?Pain Assessment: Faces ?Faces Pain Scale: Hurts little more ?Pain Location: Rt hip ?Pain Descriptors / Indicators: Aching, Grimacing, Guarding, Operative site guarding ?Pain Intervention(s): Monitored during session,  Repositioned ? ?Home Living   ?  ?  ?  ?  ?  ?  ?  ?  ?  ?  ?  ?  ?  ?  ?  ?  ?  ?  ? ?  ?Prior Functioning/Environment    ?  ?  ?  ?   ? ?Frequency ? Min 2X/week  ? ? ? ? ?  ?Progress Toward Goals ? ?OT Goals(current goals can now be found in the care plan section) ? Progress towards OT goals: Progressing toward goals ? ?   ?Plan Discharge plan remains appropriate   ? ?Co-evaluation ? ? ?   ?  ?  ?  ?  ? ?  ?AM-PAC OT "6 Clicks" Daily Activity     ?Outcome Measure ? ? Help from another person eating meals?: None ?Help from another person taking care of personal grooming?: A Little ?Help from another person toileting, which includes using toliet, bedpan, or urinal?: A Little ?Help from another person bathing (including washing, rinsing, drying)?: A Little ?Help from another person to put on and taking off regular upper body clothing?: A Little ?Help from another person to put on and taking off regular lower body clothing?: A Little ?6 Click Score: 19 ? ?  ?End of Session Equipment Utilized During Treatment: Rolling walker (2 wheels) ? ?OT Visit Diagnosis: Unsteadiness on feet (R26.81);Other abnormalities of gait and mobility (R26.89);Muscle weakness (generalized) (M62.81) ?  ?Activity Tolerance Patient tolerated treatment well ?  ?Patient Left in chair;with call bell/phone within reach;with chair alarm set ?  ?Nurse Communication Mobility status ?  ? ?   ? ?Time: 5621-3086 ?OT Time Calculation (min): 23 min ? ?Charges: OT General Charges ?$OT Visit: 1 Visit ?OT Treatments ?$Self Care/Home Management : 23-37 mins ? ?Eydan Chianese C., OTR/L ?Acute Rehabilitation Services ?Pager [phone] ?Office [phone] ? ? ?Jeani Hawking M ?03/20/2021, 4:37 PM ?

## 2021-03-21 ENCOUNTER — Other Ambulatory Visit (HOSPITAL_COMMUNITY): Payer: Self-pay

## 2021-03-21 ENCOUNTER — Ambulatory Visit: Payer: Medicare HMO

## 2021-03-21 LAB — CBC WITH DIFFERENTIAL/PLATELET
Abs Immature Granulocytes: 0.03 10*3/uL (ref 0.00–0.07)
Basophils Absolute: 0.1 10*3/uL (ref 0.0–0.1)
Basophils Relative: 1 %
Eosinophils Absolute: 0.3 10*3/uL (ref 0.0–0.5)
Eosinophils Relative: 5 %
HCT: 28.1 % — ABNORMAL LOW (ref 39.0–52.0)
Hemoglobin: 9.6 g/dL — ABNORMAL LOW (ref 13.0–17.0)
Immature Granulocytes: 0 %
Lymphocytes Relative: 12 %
Lymphs Abs: 0.8 10*3/uL (ref 0.7–4.0)
MCH: 30.2 pg (ref 26.0–34.0)
MCHC: 34.2 g/dL (ref 30.0–36.0)
MCV: 88.4 fL (ref 80.0–100.0)
Monocytes Absolute: 0.8 10*3/uL (ref 0.1–1.0)
Monocytes Relative: 11 %
Neutro Abs: 5 10*3/uL (ref 1.7–7.7)
Neutrophils Relative %: 71 %
Platelets: 237 10*3/uL (ref 150–400)
RBC: 3.18 MIL/uL — ABNORMAL LOW (ref 4.22–5.81)
RDW: 13.4 % (ref 11.5–15.5)
WBC: 7.1 10*3/uL (ref 4.0–10.5)
nRBC: 0 % (ref 0.0–0.2)

## 2021-03-21 MED ORDER — AMLODIPINE BESYLATE 5 MG PO TABS: 10.0000 mg | ORAL_TABLET | Freq: Every day | ORAL | Status: DC

## 2021-03-21 MED ORDER — AMOXICILLIN-POT CLAVULANATE 875-125 MG PO TABS
1.0000 | ORAL_TABLET | Freq: Two times a day (BID) | ORAL | 0 refills | Status: AC
  Filled 2021-03-21: qty 4, 2d supply, fill #0

## 2021-03-21 MED ORDER — TRAMADOL HCL 50 MG PO TABS
50.0000 mg | ORAL_TABLET | Freq: Three times a day (TID) | ORAL | 0 refills | Status: DC | PRN
  Filled 2021-03-21: qty 20, 7d supply, fill #0

## 2021-03-21 NOTE — Plan of Care (Signed)
?  Problem: Education: ?Goal: Knowledge of General Education information will improve ?Description: Including pain rating scale, medication(s)/side effects and non-pharmacologic comfort measures ?Outcome: Adequate for Discharge ?  ?Problem: Clinical Measurements: ?Goal: Will remain free from infection ?Outcome: Adequate for Discharge ?  ?Problem: Activity: ?Goal: Risk for activity intolerance will decrease ?Outcome: Adequate for Discharge ?  ?Problem: Nutrition: ?Goal: Adequate nutrition will be maintained ?Outcome: Adequate for Discharge ?  ?Problem: Coping: ?Goal: Level of anxiety will decrease ?Outcome: Adequate for Discharge ?  ?Problem: Elimination: ?Goal: Will not experience complications related to bowel motility ?Outcome: Adequate for Discharge ?  ?Problem: Pain Managment: ?Goal: General experience of comfort will improve ?Outcome: Adequate for Discharge ?  ?

## 2021-03-21 NOTE — Progress Notes (Signed)
Mobility Specialist Progress Note  ? ? 03/21/21 1516  ?Mobility  ?Activity Refused mobility  ? ?Pt c/o pain and being cold. RN notified.  ? ?Hildred Alamin ?Mobility Specialist  ?M.S. 5N: [phone]  ?

## 2021-03-21 NOTE — Progress Notes (Signed)
Inpatient Rehabilitation Admissions Coordinator  ? ?I met with patient and his daughter at bedside. I recommend discharge to SNF or home with either [facility] or Outpatient. Appeal by daughter 3/8 was not filed per [facility] representative when I called to clarify status today. I discussed SNF rehab if she felt uncomfortable for direct discharge home. She would like case manager and Dr Tawanna Solo to contact her to discuss arrangements and timing for discharge home. We will sign off at this time. I will alert acute team. ? ?Danne Baxter, RN, MSN ?Rehab Admissions Coordinator ?[phone] ?03/21/2021 11:40 AM ? ? ?

## 2021-03-21 NOTE — Plan of Care (Signed)

## 2021-03-21 NOTE — Progress Notes (Signed)
Subjective: ?6 Days Post-Op Procedure(s) (LRB): ?ANTERIOR APPROACH HEMI HIP ARTHROPLASTY (Right) ?Patient reports pain as 1 on 0-10 scale.   ? ?Objective: ?Vital signs in last 24 hours: ?Temp:  [98.4 ?F (36.9 ?C)-98.6 ?F (37 ?C)] 98.4 ?F (36.9 ?C) (03/10 0757) ?Pulse Rate:  [71-79] 73 (03/10 0757) ?Resp:  [15-17] 15 (03/10 0757) ?BP: (152-172)/(48-64) 153/55 (03/10 0757) ?SpO2:  [95 %-99 %] 99 % (03/10 0757) ? ?Intake/Output from previous day: ?03/09 0701 - 03/10 0700 ?In: -  ?Out: 40 [Drains:40] ?Intake/Output this shift: ?Total I/O ?In: -  ?Out: 20 [Drains:20] ? ?Recent Labs  ?  03/19/21 ?8101 03/20/21 ?0110 03/21/21 ?0316  ?HGB 9.2* 9.7* 9.6*  ? ?Recent Labs  ?  03/20/21 ?0110 03/21/21 ?0316  ?WBC 7.3 7.1  ?RBC 3.26* 3.18*  ?HCT 29.0* 28.1*  ?PLT 272 237  ? ?Recent Labs  ?  03/19/21 ?7510 03/20/21 ?0110  ?NA 137 139  ?K 4.5 4.6  ?CL 105 107  ?CO2 25 25  ?BUN 24* 23  ?CREATININE 1.07 0.98  ?GLUCOSE 105* 109*  ?CALCIUM 8.0* 8.2*  ? ?No results for input(s): LABPT, INR in the last 72 hours. ? ?Neurovascular intact ?Intact pulses distally ?Dorsiflexion/Plantar flexion intact ?Incision: dressing C/D/I ?No cellulitis present ?Compartment soft ? ? ? ?Assessment/Plan: ?6 Days Post-Op Procedure(s) (LRB): ?ANTERIOR APPROACH HEMI HIP ARTHROPLASTY (Right) ?Up with therapy ?Plan for discharge tomorrow ? ? ?Continue JP drain until follow up with Dr Lyla Glassing ?Eliquis for DVT prevention ?WBAT right lower extremity ?Continue with Aquacel dressing until follow up ? ? ?BLAIR Sahvanna Mcmanigal PA-C ?03/21/2021, 2:03 PM ?[phone] ?

## 2021-03-21 NOTE — Discharge Summary (Signed)
Physician Discharge Summary  [name] TKP:[id number] DOB: 1929-10-09 DOA: 03/14/2021  PCP: [name], MD  Admit date: 03/14/2021 Discharge date: 03/21/2021  Admitted From: Home Disposition:  Home  Discharge Condition:Stable CODE STATUS:FULL Diet recommendation: Heart Healthy  Brief/Interim Summary: [name] is a 88 y.o. male with history of A-fib on Eliquis, HTN, chronic anemia, history of recurrent left pleural effusion requiring multiple thoracentesis was brought to the ER after patient had a fall at a restaurant. Patient lost his balance and fell onto the floor. Denies losing consciousness. In the ED, CT head and C-spine were unremarkable. X-rays revealed right hip fracture. Orthopedic surgeon was consulted.  Status post right hip hemiarthroplasty on 03/15/2021.  PT/OT recommending CIR/skilled nursing facility, his insulins declined.  He is being discharged to home with home health.  Following problems were addressed during his hospitalization:  Hip fracture ([place]) Found to have displaced right femoral neck fracture after mechanical fall.  Status post right hip hemiarthroplasty on 03/15/2021.  Plan for discharge to home with home health.  He will follow-up with orthopedics as an outpatient in 2 weeks.  He will be discharged with JP drain   aspiration pneumonia ([place]) During the hospitalization, he had fever, mild leukocytosis.  Chest x-ray showed mild right upper lobe airspace disease.  Aspiration pneumonia suspected.  Started on Unasyn which has been changed to Augmentin.    Pleural effusion History of recurrent chronic left sided pleural effusion.  Follows with pulmonology.  Currently on room air   PAF (paroxysmal atrial fibrillation) (HCC) Rate controlled, normal sinus rhythm Not on any rate controlling meds Restarted Eliquis   Acute postoperative anemia due to expected blood loss Hemoglobin currently in the range of 9.  Continue to monitor asn outpatient   Essential  hypertension Currently blood pressure stable.  On amlodipine, clonidine, lisinopril, minoxidil   HLD (hyperlipidemia) Continue statin       Discharge Diagnoses:  Principal Problem:   Hip fracture ([place]) Active Problems:   Aspiration pneumonia (HCC)   Pleural effusion   PAF (paroxysmal atrial fibrillation) (HCC)   HLD (hyperlipidemia)   Essential hypertension   Acute postoperative anemia due to expected blood loss    Discharge Instructions  Discharge Instructions     Diet - low sodium heart healthy   Complete by: As directed    Discharge instructions   Complete by: As directed    1)Please take your medications as instructed. 2)Follow up with orthopedics as an outpatient in 2 weeks.  Name and number of the provider has been attached   Increase activity slowly   Complete by: As directed    No wound care   Complete by: As directed       Allergies as of 03/21/2021       Reactions   Hctz [hydrochlorothiazide] Shortness Of Breath, Swelling, Other (See Comments)   "Swelling and dyspnea"   Hydralazine Shortness Of Breath, Other (See Comments)   Chest pain and GI issues also   Diovan [valsartan] Other (See Comments)   Elevated potassium- Hyperkalemia   Metformin And Related Other (See Comments)   Reaction not recalled   Nsaids Other (See Comments)   Other than Tylenol, he isn't to have these because he's on Eliquis   Other Other (See Comments)   Unnamed topically-applied B/P patch = Caused redness   Prednisone Other (See Comments)   Suicidal thoughts   Spironolactone Swelling   Site of swelling not recalled   Codeine Itching, Nausea Only  Oxycodone-acetaminophen Itching, Nausea Only        Medication List     STOP taking these medications    famotidine 40 MG tablet Commonly known as: PEPCID   furosemide 40 MG tablet Commonly known as: LASIX       TAKE these medications    acetaminophen 500 MG tablet Commonly known as: TYLENOL Take 1,000 mg by  mouth every 6 (six) hours as needed for mild pain or headache.   albuterol 108 (90 Base) MCG/ACT inhaler Commonly known as: VENTOLIN HFA INHALE 2 PUFFS BY MOUTH INTO THE LUNGS EVERY 6 HOURS AS NEEDED What changed:  how much to take how to take this when to take this reasons to take this   amLODipine 5 MG tablet Commonly known as: NORVASC Take 2 tablets (10 mg total) by mouth daily.   amoxicillin-clavulanate 875-125 MG tablet Commonly known as: AUGMENTIN Take 1 tablet by mouth every 12 (twelve) hours for 2 days.   atorvastatin 40 MG tablet Commonly known as: LIPITOR Take 1 tablet by mouth daily.   benzonatate 100 MG capsule Commonly known as: TESSALON Take 1 capsule by mouth 3 times daily as needed for cough.   cloNIDine 0.1 MG tablet Commonly known as: CATAPRES TAKE 1 TABLET BY MOUTH 2 TIMES DAILY What changed: how much to take   docusate sodium 100 MG capsule Commonly known as: COLACE Take 300 mg by mouth 2 (two) times daily.   Eliquis 5 MG Tabs tablet Generic drug: apixaban TAKE 1 TABLET BY MOUTH 2 TIMES DAILY What changed: how much to take   ferrous sulfate 325 (65 FE) MG tablet Commonly known as: FerrouSul Take 1 tablet (325 mg total) by mouth 3 (three) times daily with meals for 14 days. What changed: when to take this   fluticasone 110 MCG/ACT inhaler Commonly known as: FLOVENT HFA Inhale 2 puffs into the lungs 2 (two) times daily. What changed:  when to take this reasons to take this   gabapentin 300 MG capsule Commonly known as: NEURONTIN TAKE 1 CAPSULE BY MOUTH 3 TIMES DAILY What changed:  how much to take when to take this   lisinopril 2.5 MG tablet Commonly known as: ZESTRIL TAKE 1 TABLET BY MOUTH DAILY. What changed: how much to take   metoCLOPramide 5 MG tablet Commonly known as: REGLAN TAKE 1 TABLET BY MOUTH AT BEDTIME. What changed: how much to take   minoxidil 10 MG tablet Commonly known as: LONITEN TAKE 1 TABLET BY MOUTH TWICE  DAILY What changed: how much to take   ondansetron 8 MG disintegrating tablet Commonly known as: Zofran ODT Dissolve 1 tablet by mouth every 8 hours as needed for nausea or vomiting.   pantoprazole 40 MG tablet Commonly known as: PROTONIX TAKE 1 TABLET BY MOUTH ONCE A DAY BEFORE BREAKFAST What changed:  how much to take how to take this when to take this   Pfizer-BioNT COVID-19 Vac-TriS Susp injection Generic drug: COVID-19 mRNA Vac-TriS (Pfizer) Inject into the muscle.   polyvinyl alcohol 1.4 % ophthalmic solution Commonly known as: LIQUIFILM TEARS Place 1 drop into both eyes 5 (five) times daily as needed for dry eyes.   sennosides-docusate sodium 8.6-50 MG tablet Commonly known as: SENOKOT-S Take 1-2 tablets by mouth See admin instructions. Take 1 tablet in the morning & take 2 tablet at nigh   traMADol 50 MG tablet Commonly known as: ULTRAM Take 1 tablet (50 mg total) by mouth 3 (three) times daily as needed for moderate  pain.   triamcinolone cream 0.5 % Commonly known as: KENALOG Apply 1 application topically daily as needed (for skin irritation).   VITAMIN D3 PO Take 1 tablet by mouth in the morning. Unsure of how many units        Follow-up Information     Swinteck, Aaron Edelman, MD. Schedule an appointment as soon as possible for a visit in 2 week(s).   Specialty: Orthopedic Surgery Why: For suture removal, For wound re-check Contact information: [address] [place] [address] [phone]                Allergies  Allergen Reactions   Hctz [Hydrochlorothiazide] Shortness Of Breath, Swelling and Other (See Comments)    "Swelling and dyspnea"    Hydralazine Shortness Of Breath and Other (See Comments)    Chest pain and GI issues also   Diovan [Valsartan] Other (See Comments)    Elevated potassium- Hyperkalemia   Metformin And Related Other (See Comments)    Reaction not recalled   Nsaids Other (See Comments)    Other than  Tylenol, he isn't to have these because he's on Eliquis   Other Other (See Comments)    Unnamed topically-applied B/P patch = Caused redness   Prednisone Other (See Comments)    Suicidal thoughts   Spironolactone Swelling    Site of swelling not recalled   Codeine Itching and Nausea Only   Oxycodone-Acetaminophen Itching and Nausea Only    Consultations: Orthopedics   Procedures/Studies: CT Head Wo Contrast  Result Date: 03/14/2021 CLINICAL DATA:  Provided history: Head trauma, minor. Neck trauma. Additional history provided: Fall on blood thinners. EXAM: CT HEAD WITHOUT CONTRAST CT CERVICAL SPINE WITHOUT CONTRAST TECHNIQUE: Multidetector CT imaging of the head and cervical spine was performed following the standard protocol without intravenous contrast. Multiplanar CT image reconstructions of the cervical spine were also generated. RADIATION DOSE REDUCTION: This exam was performed according to the departmental dose-optimization program which includes automated exposure control, adjustment of the mA and/or kV according to patient size and/or use of iterative reconstruction technique. COMPARISON:  CT head/cervical spine 10/23/2018. FINDINGS: CT HEAD FINDINGS Brain: No age advanced or lobar predominant atrophy. Redemonstrated chronic cortical/subcortical infarct within the right occipital lobe. Mild patchy and ill-defined hypoattenuation elsewhere within the cerebral white matter, nonspecific but compatible with chronic small vessel ischemic disease. Redemonstrated chronic lacunar infarct in the right thalamus. Small focal hypodensity within the inferior left basal ganglia, likely reflecting a prominent perivascular space. Unchanged small chronic infarct within the left cerebellar hemisphere. There is no acute intracranial hemorrhage. No acute demarcated cortical infarct. No extra-axial fluid collection. No evidence of an intracranial mass. No midline shift. Vascular: No hyperdense vessel.   Atherosclerotic calcifications. Skull: Normal. Negative for fracture or focal lesion. Sinuses/Orbits: Visualized orbits show no acute finding. As before, the right maxillary sinus is asymmetrically small as compared to the left. Mild mucosal thickening and fluid within the right maxillary sinus. Moderate fluid and mucosal thickening within the left maxillary sinus. Redemonstrated small chronic defect within the anterior wall of the left maxillary sinus. Both maxillary sinuses also demonstrate chronic reactive osteitis. CT CERVICAL SPINE FINDINGS Alignment: No significant spondylolisthesis. Skull base and vertebrae: The basion-dental and atlanto-dental intervals are maintained.No evidence of acute fracture to the cervical spine. Prior C4-C6 ACDF. No evidence of acute hardware compromise. Vertebral ankylosis at the operative levels. Soft tissues and spinal canal: No prevertebral fluid or swelling. No visible canal hematoma. Surgical clips within the right neck.  Disc levels: Prior C4-C6 ACDF. No evidence of acute hardware compromise. Superimposed cervical spondylosis with multilevel disc bulges, endplate spurring, uncovertebral hypertrophy and facet arthrosis. Disc space narrowing is greatest at C6-C7 (advanced at this level). No appreciable high-grade spinal canal stenosis. Multilevel bony neural foraminal narrowing. Upper chest: No consolidation within the imaged lung apices. No visible pneumothorax. IMPRESSION: CT head: 1. No evidence of acute intracranial abnormality. 2. Redemonstrated chronic infarcts within the right thalamus and right occipital lobe. 3. Background mild chronic small vessel image changes within the cerebral white matter. 4. Redemonstrated small chronic infarct within the left cerebellar hemisphere. 5. Chronic bilateral maxillary sinusitis. CT cervical spine: 1. No evidence of acute fracture to the cervical spine. 2. Prior C4-C6 ACDF. No evidence of acute hardware compromise. Vertebral ankylosis  at these levels. 3. Superimposed cervical spondylosis, as described. Electronically Signed   By: [name] D.O.   On: 03/14/2021 21:27   CT Cervical Spine Wo Contrast  Result Date: 03/14/2021 CLINICAL DATA:  Provided history: Head trauma, minor. Neck trauma. Additional history provided: Fall on blood thinners. EXAM: CT HEAD WITHOUT CONTRAST CT CERVICAL SPINE WITHOUT CONTRAST TECHNIQUE: Multidetector CT imaging of the head and cervical spine was performed following the standard protocol without intravenous contrast. Multiplanar CT image reconstructions of the cervical spine were also generated. RADIATION DOSE REDUCTION: This exam was performed according to the departmental dose-optimization program which includes automated exposure control, adjustment of the mA and/or kV according to patient size and/or use of iterative reconstruction technique. COMPARISON:  CT head/cervical spine 10/23/2018. FINDINGS: CT HEAD FINDINGS Brain: No age advanced or lobar predominant atrophy. Redemonstrated chronic cortical/subcortical infarct within the right occipital lobe. Mild patchy and ill-defined hypoattenuation elsewhere within the cerebral white matter, nonspecific but compatible with chronic small vessel ischemic disease. Redemonstrated chronic lacunar infarct in the right thalamus. Small focal hypodensity within the inferior left basal ganglia, likely reflecting a prominent perivascular space. Unchanged small chronic infarct within the left cerebellar hemisphere. There is no acute intracranial hemorrhage. No acute demarcated cortical infarct. No extra-axial fluid collection. No evidence of an intracranial mass. No midline shift. Vascular: No hyperdense vessel.  Atherosclerotic calcifications. Skull: Normal. Negative for fracture or focal lesion. Sinuses/Orbits: Visualized orbits show no acute finding. As before, the right maxillary sinus is asymmetrically small as compared to the left. Mild mucosal thickening and fluid  within the right maxillary sinus. Moderate fluid and mucosal thickening within the left maxillary sinus. Redemonstrated small chronic defect within the anterior wall of the left maxillary sinus. Both maxillary sinuses also demonstrate chronic reactive osteitis. CT CERVICAL SPINE FINDINGS Alignment: No significant spondylolisthesis. Skull base and vertebrae: The basion-dental and atlanto-dental intervals are maintained.No evidence of acute fracture to the cervical spine. Prior C4-C6 ACDF. No evidence of acute hardware compromise. Vertebral ankylosis at the operative levels. Soft tissues and spinal canal: No prevertebral fluid or swelling. No visible canal hematoma. Surgical clips within the right neck. Disc levels: Prior C4-C6 ACDF. No evidence of acute hardware compromise. Superimposed cervical spondylosis with multilevel disc bulges, endplate spurring, uncovertebral hypertrophy and facet arthrosis. Disc space narrowing is greatest at C6-C7 (advanced at this level). No appreciable high-grade spinal canal stenosis. Multilevel bony neural foraminal narrowing. Upper chest: No consolidation within the imaged lung apices. No visible pneumothorax. IMPRESSION: CT head: 1. No evidence of acute intracranial abnormality. 2. Redemonstrated chronic infarcts within the right thalamus and right occipital lobe. 3. Background mild chronic small vessel image changes within the cerebral white matter. 4. Redemonstrated small  chronic infarct within the left cerebellar hemisphere. 5. Chronic bilateral maxillary sinusitis. CT cervical spine: 1. No evidence of acute fracture to the cervical spine. 2. Prior C4-C6 ACDF. No evidence of acute hardware compromise. Vertebral ankylosis at these levels. 3. Superimposed cervical spondylosis, as described. Electronically Signed   By: [name] D.O.   On: 03/14/2021 21:27   DG Pelvis Portable  Result Date: 03/15/2021 CLINICAL DATA:  Postop right hip replacement EXAM: PORTABLE PELVIS 1-2 VIEWS  COMPARISON:  None. FINDINGS: Interval right total hip arthroplasty without hardware failure or complication. Normal alignment. Postsurgical changes in the surrounding soft tissues. Prior left hip arthroplasty without hardware failure or complication. Generalized osteopenia. Prostatic radiation seeds noted. Peripheral vascular atherosclerotic disease. IMPRESSION: 1. Interval right total hip arthroplasty. Electronically Signed   By: Kathreen Devoid M.D.   On: 03/15/2021 15:18   DG Pelvis Portable  Result Date: 03/14/2021 CLINICAL DATA:  Trauma, fall, right hip pain. EXAM: PORTABLE PELVIS 1-2 VIEWS COMPARISON:  Pelvis radiograph 10/24/2018 FINDINGS: Displaced right femoral neck fracture. There is apex lateral angulation and slight migration of the femoral shaft. Underlying right hip osteoarthritis. Left hip arthroplasty is partially visualized, intact where included. Pubic rami are intact. Brachytherapy seeds or surgical clips in the prostate bed. IMPRESSION: Displaced and angulated right femoral neck fracture. Electronically Signed   By: Keith Rake M.D.   On: 03/14/2021 21:06   DG Chest Port 1 View  Result Date: 03/18/2021 CLINICAL DATA:  Fever postop EXAM: PORTABLE CHEST 1 VIEW COMPARISON:  03/14/2021 FINDINGS: Stable large cardiac silhouette. Small LEFT effusion new from prior. Mild airspace disease in the RIGHT upper lobe increased from prior. No pneumothorax. IMPRESSION: 1. Increased mild RIGHT upper lobe airspace disease representing asymmetric edema versus pneumonia or aspiration pneumonitis. 2. Small LEFT effusion. Electronically Signed   By: Suzy Bouchard M.D.   On: 03/18/2021 08:50   DG Chest Portable 1 View  Result Date: 03/14/2021 CLINICAL DATA:  Fall, trauma. EXAM: PORTABLE CHEST 1 VIEW COMPARISON:  Radiograph 02/05/2021, CT 07/09/2020 FINDINGS: Anti lordotic positioning, the patient is rotated. Chronic cardiomegaly. Aortic atherosclerosis. Unchanged blunting of left costophrenic angle due  to chronic pleural effusion. No pneumothorax. No acute airspace disease. No acute osseous abnormalities are seen on this limited portable exam. IMPRESSION: 1. No acute findings. 2. Chronic cardiomegaly and left pleural effusion. 3. Limited rotated exam. Electronically Signed   By: Keith Rake M.D.   On: 03/14/2021 21:04   DG C-Arm 1-60 Min-No Report  Result Date: 03/15/2021 Fluoroscopy was utilized by the requesting physician.  No radiographic interpretation.   DG HIP UNILAT WITH PELVIS 1V RIGHT  Result Date: 03/15/2021 CLINICAL DATA:  Right hip arthroplasty EXAM: DG HIP (WITH OR WITHOUT PELVIS) 1V RIGHT COMPARISON:  None. FINDINGS: Multiple intraoperative fluoroscopic spot images are provided. Interval right hip arthroplasty. Normal alignment. FLUOROSCOPY TIME:  Fluoroscopy Time:  4 seconds Radiation Exposure Index (if provided by the fluoroscopic device): 0.19 mGy Number of Acquired Spot Images: 0 IMPRESSION: 1. Interval right hip arthroplasty. Electronically Signed   By: Kathreen Devoid M.D.   On: 03/15/2021 14:02   DG Femur Min 2 Views Right  Result Date: 03/14/2021 CLINICAL DATA:  Fall, right hip pain. EXAM: RIGHT FEMUR 2 VIEWS COMPARISON:  None. FINDINGS: Displaced right femoral neck fracture with slight proximal migration of the femoral shaft. The distal femur is intact. Knee alignment is difficult to assess on provided views. Overlying artifacts. IMPRESSION: Displaced right femoral neck fracture. Electronically Signed   By: Threasa Beards  Sanford M.D.   On: 03/14/2021 21:07   DG FEMUR PORT MIN 2 VIEWS LEFT  Result Date: 03/15/2021 CLINICAL DATA:  88 year old male with fall. Left hip arthroplasty. EXAM: LEFT FEMUR PORTABLE 2 VIEWS COMPARISON:  04/28/2019. FINDINGS: Chronic proximal left femoral arthroplasty. Femoral head component remains normally aligned. Hardware appears intact. Visible left hemipelvis appears intact. Chronic prostate brachytherapy. Proximal left femur appears stable and intact.  Left femoral shaft intact. Distal left femur intact. Preserved alignment at the left knee. No evidence of knee joint effusion. Multi compartment knee joint space loss. Calcified peripheral vascular disease. No acute osseous abnormality identified. IMPRESSION: 1. No acute fracture or dislocation identified about the left femur. 2. Stable chronic proximal left femoral arthroplasty. Electronically Signed   By: Genevie Ann M.D.   On: 03/15/2021 08:47      Subjective: Patient seen and examined at the bedside this morning.  Hemodynamically stable for discharge today.  Discharge Exam: Vitals:   03/21/21 0649 03/21/21 0757  BP: (!) 161/61 (!) 153/55  Pulse: 71 73  Resp: 17 15  Temp: 98.6 F (37 C) 98.4 F (36.9 C)  SpO2: 95% 99%   Vitals:   03/20/21 2233 03/20/21 2325 03/21/21 0649 03/21/21 0757  BP: (!) 172/48 (!) 152/64 (!) 161/61 (!) 153/55  Pulse: 79  71 73  Resp: 16  17 15   Temp: 98.6 F (37 C)  98.6 F (37 C) 98.4 F (36.9 C)  TempSrc: Oral  Oral Oral  SpO2: 98%  95% 99%  Weight:      Height:        General: Pt is alert, awake, not in acute distress Cardiovascular: RRR, S1/S2 +, no rubs, no gallops Respiratory: CTA bilaterally, no wheezing, no rhonchi Abdominal: Soft, NT, ND, bowel sounds + Extremities: no edema, no cyanosis, surgical wound on the right hip with JP drain    The results of significant diagnostics from this hospitalization (including imaging, microbiology, ancillary and laboratory) are listed below for reference.     Microbiology: Recent Results (from the past 240 hour(s))  Resp Panel by RT-PCR (Flu A&B, Covid) Nasopharyngeal Swab     Status: None   Collection Time: 03/14/21  9:34 PM   Specimen: Nasopharyngeal Swab; Nasopharyngeal(NP) swabs in vial transport medium  Result Value Ref Range Status   SARS Coronavirus 2 by RT PCR NEGATIVE NEGATIVE Final    Comment: (NOTE) SARS-CoV-2 target nucleic acids are NOT DETECTED.  The SARS-CoV-2 RNA is generally  detectable in upper respiratory specimens during the acute phase of infection. The lowest concentration of SARS-CoV-2 viral copies this assay can detect is 138 copies/mL. A negative result does not preclude SARS-Cov-2 infection and should not be used as the sole basis for treatment or other patient management decisions. A negative result may occur with  improper specimen collection/handling, submission of specimen other than nasopharyngeal swab, presence of viral mutation(s) within the areas targeted by this assay, and inadequate number of viral copies(<138 copies/mL). A negative result must be combined with clinical observations, patient history, and epidemiological information. The expected result is Negative.  Fact Sheet for Patients:  EntrepreneurPulse.com.au  Fact Sheet for Healthcare Providers:  IncredibleEmployment.be  This test is no t yet approved or cleared by the [place] FDA and  has been authorized for detection and/or diagnosis of SARS-CoV-2 by FDA under an Emergency Use Authorization (EUA). This EUA will remain  in effect (meaning this test can be used) for the duration of the COVID-19 declaration under Section 564(b)(1) of  the Act, 21 U.S.C.section 360bbb-3(b)(1), unless the authorization is terminated  or revoked sooner.       Influenza A by PCR NEGATIVE NEGATIVE Final   Influenza B by PCR NEGATIVE NEGATIVE Final    Comment: (NOTE) The Xpert Xpress SARS-CoV-2/FLU/RSV plus assay is intended as an aid in the diagnosis of influenza from Nasopharyngeal swab specimens and should not be used as a sole basis for treatment. Nasal washings and aspirates are unacceptable for Xpert Xpress SARS-CoV-2/FLU/RSV testing.  Fact Sheet for Patients: EntrepreneurPulse.com.au  Fact Sheet for Healthcare Providers: IncredibleEmployment.be  This test is not yet approved or cleared by the [place] FDA  and has been authorized for detection and/or diagnosis of SARS-CoV-2 by FDA under an Emergency Use Authorization (EUA). This EUA will remain in effect (meaning this test can be used) for the duration of the COVID-19 declaration under Section 564(b)(1) of the Act, 21 U.S.C. section 360bbb-3(b)(1), unless the authorization is terminated or revoked.  Performed at [facility], [place] [address], [place], [place] [address]   Surgical pcr screen     Status: None   Collection Time: 03/15/21 11:49 AM   Specimen: Nasal Mucosa; Nasal Swab  Result Value Ref Range Status   MRSA, PCR NEGATIVE NEGATIVE Final   Staphylococcus aureus NEGATIVE NEGATIVE Final    Comment: (NOTE) The Xpert SA Assay (FDA approved for NASAL specimens in patients 31 years of age and older), is one component of a comprehensive surveillance program. It is not intended to diagnose infection nor to guide or monitor treatment. Performed at [facility], [place] [address], [place], [place] [address]   Urine Culture     Status: Abnormal   Collection Time: 03/18/21  7:57 AM   Specimen: Urine, Clean Catch  Result Value Ref Range Status   Specimen Description URINE, CLEAN CATCH  Final   Special Requests   Final    NONE Performed at [facility], [place] [address], [place], [place] [address]    Culture >=100,000 COLONIES/mL PSEUDOMONAS AERUGINOSA (A)  Final   Report Status 03/20/2021 FINAL  Final   Organism ID, Bacteria PSEUDOMONAS AERUGINOSA (A)  Final      Susceptibility   Pseudomonas aeruginosa - MIC*    CEFTAZIDIME <=1 SENSITIVE Sensitive     CIPROFLOXACIN <=0.25 SENSITIVE Sensitive     GENTAMICIN <=1 SENSITIVE Sensitive     IMIPENEM 1 SENSITIVE Sensitive     PIP/TAZO 8 SENSITIVE Sensitive     CEFEPIME 2 SENSITIVE Sensitive     * >=100,000 COLONIES/mL PSEUDOMONAS AERUGINOSA     Labs: BNP (last 3 results) No results for input(s): BNP in the last 8760 hours. Basic Metabolic Panel: Recent Labs  Lab  03/14/21 2048 03/17/21 0200 03/18/21 0215 03/19/21 0506 03/20/21 0110  NA 139 137 136 137 139  K 4.7 4.4 4.7 4.5 4.6  CL 106 107 104 105 107  CO2 27 25 24 25 25   GLUCOSE 173* 103* 117* 105* 109*  BUN 21 27* 24* 24* 23  CREATININE 1.19 1.17 1.05 1.07 0.98  CALCIUM 8.8* 8.0* 8.2* 8.0* 8.2*   Liver Function Tests: Recent Labs  Lab 03/14/21 2048  AST 16  ALT 8  ALKPHOS 61  BILITOT 0.3  PROT 6.3*  ALBUMIN 3.5   No results for input(s): LIPASE, AMYLASE in the last 168 hours. No results for input(s): AMMONIA in the last 168 hours. CBC: Recent Labs  Lab 03/17/21 0200 03/18/21 0215 03/19/21 0506 03/20/21 0110 03/21/21 0932  WBC 10.4 10.8* 8.2 7.3 7.1  NEUTROABS 7.9* 8.0* 6.1 5.1 5.0  HGB 9.0* 9.8* 9.2* 9.7* 9.6*  HCT 27.1* 29.5* 27.2* 29.0* 28.1*  MCV 89.7 89.7 88.9 89.0 88.4  PLT 196 239 243 272 237   Cardiac Enzymes: No results for input(s): CKTOTAL, CKMB, CKMBINDEX, TROPONINI in the last 168 hours. BNP: Invalid input(s): POCBNP CBG: Recent Labs  Lab 03/15/21 0758  GLUCAP 140*   D-Dimer No results for input(s): DDIMER in the last 72 hours. Hgb A1c No results for input(s): HGBA1C in the last 72 hours. Lipid Profile No results for input(s): CHOL, HDL, LDLCALC, TRIG, CHOLHDL, LDLDIRECT in the last 72 hours. Thyroid function studies No results for input(s): TSH, T4TOTAL, T3FREE, THYROIDAB in the last 72 hours.  Invalid input(s): FREET3 Anemia work up No results for input(s): VITAMINB12, FOLATE, FERRITIN, TIBC, IRON, RETICCTPCT in the last 72 hours. Urinalysis    Component Value Date/Time   COLORURINE YELLOW 03/18/2021 1050   APPEARANCEUR CLEAR 03/18/2021 1050   LABSPEC 1.017 03/18/2021 1050   PHURINE 5.0 03/18/2021 1050   GLUCOSEU NEGATIVE 03/18/2021 1050   GLUCOSEU 100 (A) 12/01/2016 1858   HGBUR MODERATE (A) 03/18/2021 1050   HGBUR 2+ 12/11/2008 1025   BILIRUBINUR NEGATIVE 03/18/2021 1050   BILIRUBINUR 1+ 02/23/2018 1641   KETONESUR NEGATIVE  03/18/2021 1050   PROTEINUR 100 (A) 03/18/2021 1050   UROBILINOGEN negative (A) 02/23/2018 1641   UROBILINOGEN 0.2 12/01/2016 1858   NITRITE NEGATIVE 03/18/2021 1050   LEUKOCYTESUR NEGATIVE 03/18/2021 1050   Sepsis Labs Invalid input(s): PROCALCITONIN,  WBC,  LACTICIDVEN Microbiology Recent Results (from the past 240 hour(s))  Resp Panel by RT-PCR (Flu A&B, Covid) Nasopharyngeal Swab     Status: None   Collection Time: 03/14/21  9:34 PM   Specimen: Nasopharyngeal Swab; Nasopharyngeal(NP) swabs in vial transport medium  Result Value Ref Range Status   SARS Coronavirus 2 by RT PCR NEGATIVE NEGATIVE Final    Comment: (NOTE) SARS-CoV-2 target nucleic acids are NOT DETECTED.  The SARS-CoV-2 RNA is generally detectable in upper respiratory specimens during the acute phase of infection. The lowest concentration of SARS-CoV-2 viral copies this assay can detect is 138 copies/mL. A negative result does not preclude SARS-Cov-2 infection and should not be used as the sole basis for treatment or other patient management decisions. A negative result may occur with  improper specimen collection/handling, submission of specimen other than nasopharyngeal swab, presence of viral mutation(s) within the areas targeted by this assay, and inadequate number of viral copies(<138 copies/mL). A negative result must be combined with clinical observations, patient history, and epidemiological information. The expected result is Negative.  Fact Sheet for Patients:  EntrepreneurPulse.com.au  Fact Sheet for Healthcare Providers:  IncredibleEmployment.be  This test is no t yet approved or cleared by the [place] FDA and  has been authorized for detection and/or diagnosis of SARS-CoV-2 by FDA under an Emergency Use Authorization (EUA). This EUA will remain  in effect (meaning this test can be used) for the duration of the COVID-19 declaration under Section 564(b)(1)  of the Act, 21 U.S.C.section 360bbb-3(b)(1), unless the authorization is terminated  or revoked sooner.       Influenza A by PCR NEGATIVE NEGATIVE Final   Influenza B by PCR NEGATIVE NEGATIVE Final    Comment: (NOTE) The Xpert Xpress SARS-CoV-2/FLU/RSV plus assay is intended as an aid in the diagnosis of influenza from Nasopharyngeal swab specimens and should not be used as a sole basis for treatment. Nasal washings  and aspirates are unacceptable for Xpert Xpress SARS-CoV-2/FLU/RSV testing.  Fact Sheet for Patients: EntrepreneurPulse.com.au  Fact Sheet for Healthcare Providers: IncredibleEmployment.be  This test is not yet approved or cleared by the [place] FDA and has been authorized for detection and/or diagnosis of SARS-CoV-2 by FDA under an Emergency Use Authorization (EUA). This EUA will remain in effect (meaning this test can be used) for the duration of the COVID-19 declaration under Section 564(b)(1) of the Act, 21 U.S.C. section 360bbb-3(b)(1), unless the authorization is terminated or revoked.  Performed at [facility], [place] [address], [place], [place] [address]   Surgical pcr screen     Status: None   Collection Time: 03/15/21 11:49 AM   Specimen: Nasal Mucosa; Nasal Swab  Result Value Ref Range Status   MRSA, PCR NEGATIVE NEGATIVE Final   Staphylococcus aureus NEGATIVE NEGATIVE Final    Comment: (NOTE) The Xpert SA Assay (FDA approved for NASAL specimens in patients 26 years of age and older), is one component of a comprehensive surveillance program. It is not intended to diagnose infection nor to guide or monitor treatment. Performed at [facility], [place] [address], [place], [place] [address]   Urine Culture     Status: Abnormal   Collection Time: 03/18/21  7:57 AM   Specimen: Urine, Clean Catch  Result Value Ref Range Status   Specimen Description URINE, CLEAN CATCH  Final   Special Requests   Final     NONE Performed at [facility], [place] [address] [place], [place] [address]    Culture >=100,000 COLONIES/mL PSEUDOMONAS AERUGINOSA (A)  Final   Report Status 03/20/2021 FINAL  Final   Organism ID, Bacteria PSEUDOMONAS AERUGINOSA (A)  Final      Susceptibility   Pseudomonas aeruginosa - MIC*    CEFTAZIDIME <=1 SENSITIVE Sensitive     CIPROFLOXACIN <=0.25 SENSITIVE Sensitive     GENTAMICIN <=1 SENSITIVE Sensitive     IMIPENEM 1 SENSITIVE Sensitive     PIP/TAZO 8 SENSITIVE Sensitive     CEFEPIME 2 SENSITIVE Sensitive     * >=100,000 COLONIES/mL PSEUDOMONAS AERUGINOSA    Please note: You were cared for by a hospitalist during your hospital stay. Once you are discharged, your primary care physician will handle any further medical issues. Please note that NO REFILLS for any discharge medications will be authorized once you are discharged, as it is imperative that you return to your primary care physician (or establish a relationship with a primary care physician if you do not have one) for your post hospital discharge needs so that they can reassess your need for medications and monitor your lab values.    Time coordinating discharge: 40 minutes  SIGNED:   Shelly Coss, MD  [facility] 03/21/2021, 12:33 PM Pager [phone]  If 7PM-7AM, please contact night-coverage [email or web address] Password [email or web address]

## 2021-03-21 NOTE — TOC Progression Note (Signed)
Transition of Care (TOC) - Progression Note  ? ? ?Patient Details  ?Name: [name] ?MRN: [id number] ?Date of Birth: 1929/09/30 ? ?Transition of Care (TOC) CM/SW Contact  ?Sharin Mons, RN ?Phone Number: ?03/21/2021, 1:10 PM ? ?Clinical Narrative:    ?CIR signed off.   ?NCM spoke with pt's daughter regarding d/c  plan. Pt/family declined SNF placement . Daughter states the plan now is to bring pt home.However, unable to receive today. States they will prepare today to receive pt on tomorrow. Agreeable to home health services. [facility]. Referral made with [facility], acceptance pending. Pt with RW and BSC @ home. ?Family to provide transpotation to home. ? ?TOC team will continue to monitor and assist with needs. ? ? ?Expected Discharge Plan: [place] ?Barriers to Discharge: Other (must enter comment) ? ?Expected Discharge Plan and Services ?Expected Discharge Plan: [place] ?  ?  ?Post Acute Care Choice: IP Rehab ?Living arrangements for the past 2 months: [place] ?Expected Discharge Date: 03/21/21               ?  ?  ?  ?  ?  ?HH Arranged: PT, OT, Social Work ?[place] Agency: [place] ?Date HH Agency Contacted: 03/21/21 ?Time [place]: 0071 ?Representative spoke with at [place]: Tommi Rumps ? ? ?Social Determinants of Health (SDOH) Interventions ?  ? ?Readmission Risk Interventions ?No flowsheet data found. ? ?

## 2021-03-21 NOTE — Progress Notes (Signed)
Inpatient Rehabilitation Admissions Coordinator  ? ?I received a denial from [facility] 3/7 for CIR admit. Daughter and patient made aware and provided with appeal information on 3/7. Daughter began appeal on 3/8 and no determination has been made to date. I left a voicemail for daughter, Scarlet to call me to discuss possible discharge home.  ? ?Danne Baxter, RN, MSN ?Rehab Admissions Coordinator ?[phone] ?03/21/2021 10:05 AM ? ?

## 2021-03-21 NOTE — Progress Notes (Signed)
Occupational Therapy Treatment ?Patient Details ?Name: [name] ?MRN: [id number] ?DOB: 1929/05/17 ?Today's Date: 03/21/2021 ? ? 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 ?  ?OT comments ? Pt instructed in use and acquisition of AE for LB ADLs.  He is able to perform ADLs with set up to min A.  Reviewed walker safety strategies with pt.  He is eager to discharge home.  Recommend HHOT.   ? ?Recommendations for follow up therapy are one component of a multi-disciplinary discharge planning process, led by the attending physician.  Recommendations may be updated based on patient status, additional functional criteria and insurance authorization. ?   ?Follow Up Recommendations ? Home health OT  ?  ?Assistance Recommended at Discharge Intermittent Supervision/Assistance  ?Patient can return home with the following ? A little help with bathing/dressing/bathroom;A little help with walking and/or transfers;Assist for transportation;Help with stairs or ramp for entrance;Assistance with cooking/housework ?  ?Equipment Recommendations ? None recommended by OT  ?  ?Recommendations for Other Services   ? ?  ?Precautions / Restrictions Precautions ?Precautions: Fall ?Precaution Comments: JP drain ?Restrictions ?Weight Bearing Restrictions: Yes ?RLE Weight Bearing: Weight bearing as tolerated  ? ? ?  ? ?Mobility Bed Mobility ?  ?  ?  ?  ?  ?  ?  ?  ?  ? ?Transfers ?  ?  ?  ?  ?  ?  ?   ?  ?  ?  ?  ?  ?Balance   ?  ?  ?  ?  ?  ?  ?  ?  ?  ?  ?  ?  ?  ?  ?  ?  ?  ?  ?   ? ?ADL either performed or assessed with clinical judgement  ? ?ADL Overall ADL's : Needs assistance/impaired ?  ?  ?  ?  ?  ?  ?Lower Body Bathing: Min guard;With adaptive equipment;Sit to/from stand ?  ?  ?  ?Lower Body Dressing: Min guard;Sit to/from stand;With adaptive equipment ?Lower Body Dressing Details (indicate cue type and reason): Pt was instructed in use of AE for LB ADLs and was able to return demonstration.  He reports he has a Sports administrator at home, and he is aware of where he can purchase a sock aid if he decides to do so ?  ?  ?  ?  ?  ?Tub/Shower Transfer Details (indicate cue type and reason): Pt reports he will sponge bathe initially ?Functional mobility during ADLs: Min guard;Rolling walker (2 wheels) ?General ADL Comments: Pt reports he feels confident with his abilities to perform ADLs and is eager to discharge home. ?  ? ?Extremity/Trunk Assessment Upper Extremity Assessment ?Upper Extremity Assessment: Defer to OT evaluation ?  ?Lower Extremity Assessment ?Lower Extremity Assessment: Defer to PT evaluation ?  ?  ?  ? ?Vision   ?  ?  ?Perception   ?  ?Praxis   ?  ? ?Cognition Arousal/Alertness: Awake/alert ?Behavior During Therapy: [facility] for tasks assessed/performed ?Overall Cognitive Status: Within Functional Limits for tasks assessed ?  ?  ?  ?  ?  ?  ?  ?  ?  ?  ?  ?  ?  ?  ?  ?  ?  ?  ?  ?   ?  Exercises   ? ?  ?Shoulder Instructions   ? ? ?  ?General Comments discussed use of walker bag with pt and he reports he has one to use at home  ? ? ?Pertinent Vitals/ Pain       Pain Assessment ?Pain Assessment: Faces ?Faces Pain Scale: Hurts little more ?Pain Location: Rt LE ?Pain Descriptors / Indicators: Aching, Grimacing ?Pain Intervention(s): Monitored during session, Limited activity within patient's tolerance, Repositioned ? ?Home Living   ?  ?  ?  ?  ?  ?  ?  ?  ?  ?  ?  ?  ?  ?  ?  ?  ?  ?  ? ?  ?Prior  Functioning/Environment    ?  ?  ?  ?   ? ?Frequency ? Min 2X/week  ? ? ? ? ?  ?Progress Toward Goals ? ?OT Goals(current goals can now be found in the care plan section) ? Progress towards OT goals: Progressing toward goals ? ?   ?Plan Discharge plan needs to be updated;Equipment recommendations need to be updated   ? ?Co-evaluation ? ? ?   ?  ?  ?  ?  ? ?  ?AM-PAC OT "6 Clicks" Daily Activity     ?Outcome Measure ? ? Help from another person eating meals?: None ?Help from another person taking care of personal grooming?: A Little ?Help from another person toileting, which includes using toliet, bedpan, or urinal?: A Little ?Help from another person bathing (including washing, rinsing, drying)?: A Little ?Help from another person to put on and taking off regular upper body clothing?: A Little ?Help from another person to put on and taking off regular lower body clothing?: A Little ?6 Click Score: 19 ? ?  ?End of Session Equipment Utilized During Treatment: Rolling walker (2 wheels) ? ?OT Visit Diagnosis: Unsteadiness on feet (R26.81);Other abnormalities of gait and mobility (R26.89);Muscle weakness (generalized) (M62.81) ?  ?Activity Tolerance Patient tolerated treatment well ?  ?Patient Left in chair;with call bell/phone within reach;with chair alarm set ?  ?Nurse Communication Mobility status ?  ? ?   ? ?Time: 2952-8413 ?OT Time Calculation (min): 22 min ? ?Charges: OT General Charges ?$OT Visit: 1 Visit ?OT Treatments ?$Self Care/Home Management : 8-22 mins ? ?Varsha Knock C., OTR/L ?Acute Rehabilitation Services ?Pager [phone] ?Office [phone] ? ? ?Ceylon Arenson M ?03/21/2021, 1:54 PM ?

## 2021-03-21 NOTE — Progress Notes (Signed)
Physical Therapy Treatment ?Patient Details ?Name: [name] ?MRN: [id number] ?DOB: 1929-02-06 ?Today's Date: 03/21/2021 ? ? 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 ? ?  ?PT Comments  ? ? Pt received supine and agreeable to session with good progress towards towards goals with initiation of stair training. Pt requiring min assist to ascend/descend 2 steps x2 bouts in therapy gym with fair recall for sequencing. Pt demonstrating good stability with no LOB with good use of UE for steadying throughout. Current plan remains appropriate to address deficits and maximize functional independence and decrease caregiver burden. Pt continues to benefit from skilled PT services to progress toward functional mobility goals.  ?  ?Recommendations for follow up therapy are one component of a multi-disciplinary discharge planning process, led by the attending physician.  Recommendations may be updated based on patient status, additional functional criteria and insurance authorization. ? ?Follow Up Recommendations ? Skilled nursing-short term rehab (<3 hours/day) ?  ?  ?Assistance Recommended at Discharge Set up Supervision/Assistance  ?Patient can return home with the following A little help with walking and/or transfers;A little help with bathing/dressing/bathroom;Assistance with cooking/housework;Help with stairs or ramp for entrance ?  ?Equipment Recommendations ? Rolling walker  (2 wheels)  ?  ?Recommendations for Other Services OT consult ? ? ?  ?Precautions / Restrictions Precautions ?Precautions: Fall ?Precaution Comments: JP drain ?Restrictions ?Weight Bearing Restrictions: Yes ?RLE Weight Bearing: Weight bearing as tolerated  ?  ? ?Mobility ? Bed Mobility ?Overal bed mobility: Needs Assistance ?Bed Mobility: Supine to Sit ?  ?  ?Supine to sit: Min assist ?  ?  ?General bed mobility comments: min HHA to elevate trunk ?  ? ?Transfers ?Overall transfer level: Needs assistance ?Equipment used: Rolling walker (2 wheels) ?Transfers: Sit to/from Stand ?Sit to Stand: Min assist ?  ?  ?  ?  ?  ?General transfer comment: min assist to steady after coming to standing, cues for safe hand placement ?  ? ?Ambulation/Gait ?Ambulation/Gait assistance: Min guard, Min assist ?Gait Distance (Feet): 20 Feet ?Assistive device: Rolling walker (2 wheels) ?Gait Pattern/deviations: Step-through pattern, Decreased step length - right, Decreased step length - left, Trunk flexed, Shuffle, Scissoring, Narrow base of support ?Gait velocity: decr ?  ?  ?General Gait Details: min assist for cues for upright posture ? ? ?Stairs ?Stairs: Yes ?Stairs assistance: Min assist ?Stair Management: Two rails, Step to pattern ?Number of Stairs: 4 ?General stair comments: steady ascent/descent of 2 stiars x2 in therapy gym. Pt with fair recall of sequencing ? ? ?Wheelchair Mobility ?  ? ?Modified Rankin (Stroke Patients Only) ?  ? ? ?  ?Balance Overall balance assessment: Needs assistance ?Sitting-balance support: Feet supported ?Sitting balance-Leahy Scale: Good ?  ?  ?Standing balance support: Reliant on assistive device for balance ?Standing balance-Leahy Scale: Poor ?  ?  ?  ?  ?  ?  ?  ?  ?  ?  ?  ?  ?  ? ?  ?Cognition Arousal/Alertness: Awake/alert ?Behavior During Therapy: [facility] for tasks assessed/performed ?Overall Cognitive Status: Within Functional Limits for tasks assessed ?  ?  ?  ?  ?  ?  ?  ?  ?  ?  ?  ?  ?  ?  ?  ?   ?  ?  ?  ? ?  ?  Exercises   ? ?  ?General Comments   ?  ?  ? ?Pertinent Vitals/Pain Pain Assessment ?Pain Assessment: Faces ?Faces Pain Scale: Hurts even more ?Pain Location: RLE on standing ?Pain Descriptors / Indicators: Aching, Grimacing, Guarding, Operative site guarding ?Pain Intervention(s): Limited activity within patient's tolerance, Monitored during session, Repositioned  ? ? ?Home Living   ?  ?  ?  ?  ?  ?  ?  ?  ?  ?   ?  ?Prior Function    ?  ?  ?   ? ?PT Goals (current goals can now be found in the care plan section) Acute Rehab PT Goals ?PT Goal Formulation: With patient ?Time For Goal Achievement: 03/30/21 ? ?  ?Frequency ? ? ? Min 4X/week ? ? ? ?  ?PT Plan    ? ? ?Co-evaluation   ?  ?  ?  ?  ? ?  ?AM-PAC PT "6 Clicks" Mobility   ?Outcome Measure ? Help needed turning from your back to your side while in a flat bed without using bedrails?: A Little ?Help needed moving from lying on your back to sitting on the side of a flat bed without using bedrails?: A Little ?Help needed moving to and from a bed to a chair (including a wheelchair)?: A Little ?Help needed standing up from a chair using your arms (e.g., wheelchair or bedside chair)?: A Little ?Help needed to walk in hospital room?: A Little ?Help needed climbing 3-5 steps with a railing? : A Lot (cues only) ?6 Click Score: 17 ? ?  ?End of Session Equipment Utilized During Treatment: Gait belt ?Activity Tolerance: Patient tolerated treatment well;Patient limited by pain ?Patient left: in chair;with call bell/phone within reach;with chair alarm set ?Nurse Communication: Mobility status ?PT Visit Diagnosis: Unsteadiness on feet (R26.81);History of falling (Z91.81);Pain;Other abnormalities of gait and mobility (R26.89) ?  ? ? ?Time: 4259-5638 ?PT Time Calculation (min) (ACUTE ONLY): 30 min ? ?Charges:  $Gait Training: 23-37 mins          ?          ? ?Lenora Boys. PTA ?Acute Rehabilitation Services ?Office: [phone] ? ? ? ?Marlana Salvage Letita Prentiss ?03/21/2021, 10:15  AM ? ?

## 2021-03-22 LAB — CBC WITH DIFFERENTIAL/PLATELET
Abs Immature Granulocytes: 0.04 10*3/uL (ref 0.00–0.07)
Basophils Absolute: 0.1 10*3/uL (ref 0.0–0.1)
Basophils Relative: 1 %
Eosinophils Absolute: 0.3 10*3/uL (ref 0.0–0.5)
Eosinophils Relative: 4 %
HCT: 26.8 % — ABNORMAL LOW (ref 39.0–52.0)
Hemoglobin: 8.9 g/dL — ABNORMAL LOW (ref 13.0–17.0)
Immature Granulocytes: 1 %
Lymphocytes Relative: 15 %
Lymphs Abs: 1 10*3/uL (ref 0.7–4.0)
MCH: 29.2 pg (ref 26.0–34.0)
MCHC: 33.2 g/dL (ref 30.0–36.0)
MCV: 87.9 fL (ref 80.0–100.0)
Monocytes Absolute: 0.8 10*3/uL (ref 0.1–1.0)
Monocytes Relative: 13 %
Neutro Abs: 4.3 10*3/uL (ref 1.7–7.7)
Neutrophils Relative %: 66 %
Platelets: 325 10*3/uL (ref 150–400)
RBC: 3.05 MIL/uL — ABNORMAL LOW (ref 4.22–5.81)
RDW: 13.5 % (ref 11.5–15.5)
WBC: 6.4 10*3/uL (ref 4.0–10.5)
nRBC: 0 % (ref 0.0–0.2)

## 2021-03-22 NOTE — TOC Transition Note (Signed)
Transition of Care (TOC) - CM/SW Discharge Note ? ? ?Patient Details  ?Name: [name] ?MRN: [id number] ?Date of Birth: [birth year] ? ?Transition of Care (TOC) CM/SW Contact:  ?Bartholomew Crews, RN ?Phone Number: [phone] ?03/22/2021, 8:40 AM ? ? ?Clinical Narrative:    ? ?Patient to transition home today. Confirmed with Tommi Rumps at [place] that referral for [facility] was accepted. Noted in previous NCM note that family intends to transport patient home. No further TOC needs identified at this time.  ? ?Final next level of care: [place] ?Barriers to Discharge: No Barriers Identified ? ? ?Patient Goals and CMS Choice ?Patient states their goals for this hospitalization and ongoing recovery are:: walk again ?CMS Medicare.gov Compare Post Acute Care list provided to:: Patient ?Choice offered to / list presented to : Patient ? ?Discharge Placement ?  ?           ?  ?  ?  ?  ? ?Discharge Plan and Services ?  ?  ?Post Acute Care Choice: IP Rehab          ?  ?  ?  ?  ?  ?HH Arranged: PT, OT, Social Work ?[place] Agency: [place] ?Date HH Agency Contacted: 03/22/21 ?Time [place]: 0840 ?Representative spoke with at [place]: Tommi Rumps ? ?Social Determinants of Health (SDOH) Interventions ?  ? ? ?Readmission Risk Interventions ?No flowsheet data found. ? ? ? ? ?

## 2021-03-22 NOTE — Progress Notes (Signed)
Patient seen and examined at the bedside this morning.  Hemodynamically stable.  Comfortable.  No active issues.  He is medically stable for discharge today.  Discharge summary and orders were already in place since yesterday.  No new change in the medical management ?

## 2021-03-24 ENCOUNTER — Other Ambulatory Visit (HOSPITAL_COMMUNITY): Payer: Self-pay

## 2021-03-24 ENCOUNTER — Other Ambulatory Visit: Payer: Self-pay | Admitting: Family Medicine

## 2021-03-24 DIAGNOSIS — E559 Vitamin D deficiency, unspecified: Secondary | ICD-10-CM

## 2021-03-24 DIAGNOSIS — I10 Essential (primary) hypertension: Secondary | ICD-10-CM

## 2021-03-24 DIAGNOSIS — I6350 Cerebral infarction due to unspecified occlusion or stenosis of unspecified cerebral artery: Secondary | ICD-10-CM

## 2021-03-24 DIAGNOSIS — E782 Mixed hyperlipidemia: Secondary | ICD-10-CM

## 2021-03-25 ENCOUNTER — Other Ambulatory Visit (HOSPITAL_COMMUNITY): Payer: Self-pay

## 2021-03-25 MED ORDER — ATORVASTATIN CALCIUM 40 MG PO TABS
40.0000 mg | ORAL_TABLET | Freq: Every day | ORAL | 0 refills | Status: DC
  Filled 2021-03-25: qty 90, 90d supply, fill #0

## 2021-03-27 ENCOUNTER — Other Ambulatory Visit (HOSPITAL_COMMUNITY): Payer: Self-pay

## 2021-03-27 ENCOUNTER — Telehealth: Payer: Self-pay | Admitting: Family Medicine

## 2021-03-27 NOTE — Telephone Encounter (Signed)
Clair Gulling the PT from [facility] called to make DR Charlett Blake aware of a few things: ?Patient received antibiotics when discharged from the hospital and they should've been done by Monday but when with the patient, half a pill fell out of his jacket. ?Wife states he has been having confusion day and night regarding medications. ?Patient reported his dressing leaked the day after discharge. ?Wife is in charge of the gabapiton medication the patient takes, and patient has been taking 1 at night which is different than the discharge instructions.  ?During the hospital stay he was diagnosed with aspiration pneumonia.  ?Patient needs a referral to speech therapy due to the choking.  ?Patient has a stage 2 pressure sore on right glute.  ?For questions Clair Gulling can be reached at [phone]. ?

## 2021-03-28 DIAGNOSIS — Z471 Aftercare following joint replacement surgery: Secondary | ICD-10-CM | POA: Diagnosis not present

## 2021-03-28 DIAGNOSIS — S72031D Displaced midcervical fracture of right femur, subsequent encounter for closed fracture with routine healing: Secondary | ICD-10-CM | POA: Diagnosis not present

## 2021-03-28 DIAGNOSIS — Z96641 Presence of right artificial hip joint: Secondary | ICD-10-CM | POA: Diagnosis not present

## 2021-03-31 ENCOUNTER — Other Ambulatory Visit: Payer: Self-pay | Admitting: Family Medicine

## 2021-03-31 ENCOUNTER — Other Ambulatory Visit (HOSPITAL_COMMUNITY): Payer: Self-pay

## 2021-03-31 DIAGNOSIS — I6350 Cerebral infarction due to unspecified occlusion or stenosis of unspecified cerebral artery: Secondary | ICD-10-CM

## 2021-03-31 DIAGNOSIS — E782 Mixed hyperlipidemia: Secondary | ICD-10-CM

## 2021-03-31 DIAGNOSIS — I10 Essential (primary) hypertension: Secondary | ICD-10-CM

## 2021-03-31 DIAGNOSIS — E559 Vitamin D deficiency, unspecified: Secondary | ICD-10-CM

## 2021-03-31 MED ORDER — LISINOPRIL 2.5 MG PO TABS
ORAL_TABLET | Freq: Every day | ORAL | 0 refills | Status: DC
  Filled 2021-03-31 – 2021-04-14 (×2): qty 90, 90d supply, fill #0

## 2021-04-02 ENCOUNTER — Other Ambulatory Visit (HOSPITAL_COMMUNITY): Payer: Self-pay

## 2021-04-03 ENCOUNTER — Other Ambulatory Visit (INDEPENDENT_AMBULATORY_CARE_PROVIDER_SITE_OTHER): Payer: Medicare HMO

## 2021-04-03 ENCOUNTER — Telehealth: Payer: Self-pay | Admitting: Family Medicine

## 2021-04-03 ENCOUNTER — Other Ambulatory Visit: Payer: Self-pay

## 2021-04-03 DIAGNOSIS — R399 Unspecified symptoms and signs involving the genitourinary system: Secondary | ICD-10-CM

## 2021-04-03 DIAGNOSIS — M549 Dorsalgia, unspecified: Secondary | ICD-10-CM

## 2021-04-03 DIAGNOSIS — K5730 Diverticulosis of large intestine without perforation or abscess without bleeding: Secondary | ICD-10-CM

## 2021-04-03 DIAGNOSIS — J45909 Unspecified asthma, uncomplicated: Secondary | ICD-10-CM

## 2021-04-03 DIAGNOSIS — E785 Hyperlipidemia, unspecified: Secondary | ICD-10-CM

## 2021-04-03 DIAGNOSIS — C61 Malignant neoplasm of prostate: Secondary | ICD-10-CM

## 2021-04-03 DIAGNOSIS — Z8673 Personal history of transient ischemic attack (TIA), and cerebral infarction without residual deficits: Secondary | ICD-10-CM

## 2021-04-03 DIAGNOSIS — Z87891 Personal history of nicotine dependence: Secondary | ICD-10-CM

## 2021-04-03 DIAGNOSIS — D62 Acute posthemorrhagic anemia: Secondary | ICD-10-CM

## 2021-04-03 DIAGNOSIS — K649 Unspecified hemorrhoids: Secondary | ICD-10-CM

## 2021-04-03 DIAGNOSIS — Z7951 Long term (current) use of inhaled steroids: Secondary | ICD-10-CM

## 2021-04-03 DIAGNOSIS — I10 Essential (primary) hypertension: Secondary | ICD-10-CM

## 2021-04-03 DIAGNOSIS — G894 Chronic pain syndrome: Secondary | ICD-10-CM

## 2021-04-03 DIAGNOSIS — M25512 Pain in left shoulder: Secondary | ICD-10-CM

## 2021-04-03 DIAGNOSIS — L89312 Pressure ulcer of right buttock, stage 2: Secondary | ICD-10-CM

## 2021-04-03 DIAGNOSIS — S72001D Fracture of unspecified part of neck of right femur, subsequent encounter for closed fracture with routine healing: Secondary | ICD-10-CM

## 2021-04-03 DIAGNOSIS — W19XXXD Unspecified fall, subsequent encounter: Secondary | ICD-10-CM

## 2021-04-03 DIAGNOSIS — M47812 Spondylosis without myelopathy or radiculopathy, cervical region: Secondary | ICD-10-CM

## 2021-04-03 DIAGNOSIS — Z96641 Presence of right artificial hip joint: Secondary | ICD-10-CM

## 2021-04-03 DIAGNOSIS — I480 Paroxysmal atrial fibrillation: Secondary | ICD-10-CM

## 2021-04-03 DIAGNOSIS — L89510 Pressure ulcer of right ankle, unstageable: Secondary | ICD-10-CM

## 2021-04-03 DIAGNOSIS — Z7901 Long term (current) use of anticoagulants: Secondary | ICD-10-CM

## 2021-04-03 DIAGNOSIS — Z8709 Personal history of other diseases of the respiratory system: Secondary | ICD-10-CM

## 2021-04-03 DIAGNOSIS — Z96642 Presence of left artificial hip joint: Secondary | ICD-10-CM

## 2021-04-03 DIAGNOSIS — Z9181 History of falling: Secondary | ICD-10-CM

## 2021-04-03 DIAGNOSIS — J690 Pneumonitis due to inhalation of food and vomit: Secondary | ICD-10-CM

## 2021-04-03 DIAGNOSIS — K219 Gastro-esophageal reflux disease without esophagitis: Secondary | ICD-10-CM

## 2021-04-03 LAB — URINALYSIS, ROUTINE W REFLEX MICROSCOPIC
Bilirubin Urine: NEGATIVE
Ketones, ur: NEGATIVE
Leukocytes,Ua: NEGATIVE
Nitrite: NEGATIVE
Specific Gravity, Urine: >=1.03 — AB (ref 1.000–1.030)
Total Protein, Urine: 100 — AB
Urine Glucose: NEGATIVE
Urobilinogen, UA: 0.2 (ref 0.0–1.0)
pH: 5.5 (ref 5.0–8.0)

## 2021-04-03 NOTE — Telephone Encounter (Signed)
Patient's wife states the patient might have an UTI, she was advised to make an appointment but she refused stating they dont have time. She stated that she could drop by today or tomorrow to drop off an urine sample in order for get to get antibiotics. Please advise.  ?

## 2021-04-03 NOTE — Telephone Encounter (Signed)
[name] notified.

## 2021-04-03 NOTE — Telephone Encounter (Signed)
Spoke with pt's wife, she is going to [facility] lab today for blood work and taking Ray with her. UA and culture ordered for [facility] lab. ?

## 2021-04-04 ENCOUNTER — Other Ambulatory Visit (HOSPITAL_COMMUNITY): Payer: Self-pay

## 2021-04-04 ENCOUNTER — Other Ambulatory Visit: Payer: Self-pay | Admitting: Internal Medicine

## 2021-04-04 LAB — URINE CULTURE
MICRO NUMBER:: 13170774
SPECIMEN QUALITY:: ADEQUATE

## 2021-04-05 ENCOUNTER — Other Ambulatory Visit (HOSPITAL_COMMUNITY): Payer: Self-pay

## 2021-04-07 ENCOUNTER — Ambulatory Visit: Payer: Medicare HMO | Admitting: Medical

## 2021-04-07 ENCOUNTER — Other Ambulatory Visit (HOSPITAL_COMMUNITY): Payer: Self-pay

## 2021-04-07 MED ORDER — AMLODIPINE BESYLATE 5 MG PO TABS
ORAL_TABLET | Freq: Every day | ORAL | 1 refills | Status: DC
  Filled 2021-04-07: qty 180, 90d supply, fill #0
  Filled 2021-07-10: qty 180, 90d supply, fill #1

## 2021-04-08 ENCOUNTER — Other Ambulatory Visit (HOSPITAL_COMMUNITY): Payer: Self-pay

## 2021-04-09 ENCOUNTER — Other Ambulatory Visit (HOSPITAL_COMMUNITY): Payer: Self-pay

## 2021-04-09 ENCOUNTER — Other Ambulatory Visit: Payer: Self-pay | Admitting: Family Medicine

## 2021-04-11 ENCOUNTER — Other Ambulatory Visit: Payer: Self-pay | Admitting: Family Medicine

## 2021-04-11 ENCOUNTER — Other Ambulatory Visit (HOSPITAL_COMMUNITY): Payer: Self-pay

## 2021-04-12 ENCOUNTER — Other Ambulatory Visit (HOSPITAL_COMMUNITY): Payer: Self-pay

## 2021-04-14 ENCOUNTER — Other Ambulatory Visit (HOSPITAL_COMMUNITY): Payer: Self-pay

## 2021-04-14 MED ORDER — TRAMADOL HCL 50 MG PO TABS
ORAL_TABLET | ORAL | 0 refills | Status: DC
  Filled 2021-04-14: qty 90, 30d supply, fill #0

## 2021-04-14 NOTE — Telephone Encounter (Signed)
Requesting: tramadol 50mg   ?Contract: 07/16/20 ?UDS: 07/16/20 ?Last Visit: 07/16/20 ?Next Visit: 04/15/21 w/ Percell Miller ?Last Refill: 03/21/21 #20 and 0RF ? ?Please Advise ? ?

## 2021-04-15 ENCOUNTER — Ambulatory Visit: Payer: Medicare HMO | Admitting: Medical

## 2021-04-17 ENCOUNTER — Ambulatory Visit (INDEPENDENT_AMBULATORY_CARE_PROVIDER_SITE_OTHER): Payer: Medicare HMO | Admitting: Medical

## 2021-04-17 ENCOUNTER — Encounter: Payer: Self-pay | Admitting: Medical

## 2021-04-17 VITALS — BP 115/60 | HR 89 | Temp 98.2°F | Resp 18 | Ht 70.0 in | Wt 144.0 lb

## 2021-04-17 DIAGNOSIS — G8929 Other chronic pain: Secondary | ICD-10-CM

## 2021-04-17 DIAGNOSIS — E782 Mixed hyperlipidemia: Secondary | ICD-10-CM | POA: Diagnosis not present

## 2021-04-17 DIAGNOSIS — R350 Frequency of micturition: Secondary | ICD-10-CM

## 2021-04-17 DIAGNOSIS — I480 Paroxysmal atrial fibrillation: Secondary | ICD-10-CM

## 2021-04-17 DIAGNOSIS — K219 Gastro-esophageal reflux disease without esophagitis: Secondary | ICD-10-CM

## 2021-04-17 DIAGNOSIS — E1165 Type 2 diabetes mellitus with hyperglycemia: Secondary | ICD-10-CM | POA: Diagnosis not present

## 2021-04-17 DIAGNOSIS — I10 Essential (primary) hypertension: Secondary | ICD-10-CM | POA: Diagnosis not present

## 2021-04-17 DIAGNOSIS — R319 Hematuria, unspecified: Secondary | ICD-10-CM | POA: Diagnosis not present

## 2021-04-17 DIAGNOSIS — D649 Anemia, unspecified: Secondary | ICD-10-CM | POA: Diagnosis not present

## 2021-04-17 LAB — CBC WITH DIFFERENTIAL/PLATELET
Basophils Absolute: 0.1 10*3/uL (ref 0.0–0.1)
Basophils Relative: 0.9 % (ref 0.0–3.0)
Eosinophils Absolute: 0.2 10*3/uL (ref 0.0–0.7)
Eosinophils Relative: 2.2 % (ref 0.0–5.0)
HCT: 32.1 % — ABNORMAL LOW (ref 39.0–52.0)
Hemoglobin: 10.8 g/dL — ABNORMAL LOW (ref 13.0–17.0)
Lymphocytes Relative: 12.3 % (ref 12.0–46.0)
Lymphs Abs: 0.9 10*3/uL (ref 0.7–4.0)
MCHC: 33.6 g/dL (ref 30.0–36.0)
MCV: 89.5 fl (ref 78.0–100.0)
Monocytes Absolute: 0.5 10*3/uL (ref 0.1–1.0)
Monocytes Relative: 6.9 % (ref 3.0–12.0)
Neutro Abs: 5.8 10*3/uL (ref 1.4–7.7)
Neutrophils Relative %: 77.7 % — ABNORMAL HIGH (ref 43.0–77.0)
Platelets: 332 10*3/uL (ref 150.0–400.0)
RBC: 3.58 Mil/uL — ABNORMAL LOW (ref 4.22–5.81)
RDW: 16 % — ABNORMAL HIGH (ref 11.5–15.5)
WBC: 7.4 10*3/uL (ref 4.0–10.5)

## 2021-04-17 LAB — HEMOGLOBIN A1C: Hgb A1c MFr Bld: 5.8 % (ref 4.6–6.5)

## 2021-04-17 LAB — POC URINALSYSI DIPSTICK (AUTOMATED)
Glucose, UA: NEGATIVE
Ketones, UA: NEGATIVE
Leukocytes, UA: NEGATIVE
Nitrite, UA: NEGATIVE
Protein, UA: POSITIVE — AB
Spec Grav, UA: >=1.03 — AB (ref 1.010–1.025)
Urobilinogen, UA: 0.2 E.U./dL
pH, UA: 6 (ref 5.0–8.0)

## 2021-04-17 LAB — COMPREHENSIVE METABOLIC PANEL
ALT: 4 U/L (ref 0–53)
AST: 11 U/L (ref 0–37)
Albumin: 3.4 g/dL — ABNORMAL LOW (ref 3.5–5.2)
Alkaline Phosphatase: 62 U/L (ref 39–117)
BUN: 22 mg/dL (ref 6–23)
CO2: 28 mEq/L (ref 19–32)
Calcium: 8.7 mg/dL (ref 8.4–10.5)
Chloride: 107 mEq/L (ref 96–112)
Creatinine, Ser: 1.08 mg/dL (ref 0.40–1.50)
GFR: 60.78 mL/min (ref >60.00)
Glucose, Bld: 124 mg/dL — ABNORMAL HIGH (ref 70–99)
Potassium: 4.1 mEq/L (ref 3.5–5.1)
Sodium: 142 mEq/L (ref 135–145)
Total Bilirubin: 0.3 mg/dL (ref 0.2–1.2)
Total Protein: 5.7 g/dL — ABNORMAL LOW (ref 6.0–8.3)

## 2021-04-17 LAB — LIPID PANEL
Cholesterol: 110 mg/dL (ref 0–200)
HDL: 43 mg/dL (ref >39.00)
LDL Cholesterol: 50 mg/dL (ref 0–99)
NonHDL: 67.36
Total CHOL/HDL Ratio: 3
Triglycerides: 88 mg/dL (ref 0.0–149.0)
VLDL: 17.6 mg/dL (ref 0.0–40.0)

## 2021-04-17 LAB — PSA: PSA: 0 ng/mL — ABNORMAL LOW (ref 0.10–4.00)

## 2021-04-17 NOTE — Patient Instructions (Addendum)
Chronic pain/most recent hip pain worse-continue tramadol.  On review up-to-date on both UDS and contract. ? ?Diabetes-historically well controlled with no medication.  We will recheck D5W and metabolic panel today.  Make sure sugar averages still controlled. ? ?Hyperlipidemia-check lipid panel today.  Presently on atorvastatin 40 mg daily. ? ?GERD-well-controlled on Protonix 40 mg daily. ? ?Anemia-we will recheck CBC today. ? ?Frequent urination-we will get POCT urine, urine culture and PSA. ? ?History of paroxysmal atrial fibrillation.  On exam does not sound to be in atrial fibrillation.  Pulse less than 100. ? ?Hypertension-blood pressure well controlled.  Continue current regimen of minoxidil, clonidine, amlodipine and lisinopril. ? ?Follow-up date to be determined after lab review. ?

## 2021-04-17 NOTE — Progress Notes (Signed)
? ?Subjective:  ? ? Patient ID: [name], male    DOB: 1929/08/14, 88 y.o.   MRN: [id number] ? ?HPI ?Pt update me that he surgery about 5 weeks ago for rt hip fracture. Pt using walker.  ? ?Pt is on tramdol for hip pain and other pains. He is on contract and uds. Uds up to date but not sure about contract. Will update that today if not found by MA.  ? ?Diabetes- in past a1c good.  ? ?Htn- only on clonidine 0.1 mg twice daily. Amlodipine 5 mg day and lisinopril 2.5 mg daily. ? ?Jerrye Bushy- controlled with protonix 40 mg daily. ? ?Pt has had anemia on lab review. ? ?High cholesterol- on lipitor 40 mg daily.  ? ?Uti when was in hospital. But still frequent urination. Does drink a lot of coffee. ? ?Review of Systems  ?Constitutional:  Negative for chills, fatigue and fever.  ?HENT:  Negative for congestion.   ?Respiratory:  Negative for cough, chest tightness, shortness of breath, wheezing and stridor.   ?Cardiovascular:  Negative for chest pain and palpitations.  ?Gastrointestinal:  Negative for abdominal pain.  ?Genitourinary:  Negative for flank pain.  ?Musculoskeletal:  Positive for arthralgias.  ?     Rt hip pain. And other joint pains.  ?Skin:  Negative for rash.  ?Neurological:  Negative for dizziness, speech difficulty, weakness, numbness and headaches.  ?Hematological:  Negative for adenopathy. Does not bruise/bleed easily.  ?Psychiatric/Behavioral:  Negative for behavioral problems and decreased concentration. The patient is not nervous/anxious.   ? ?Past Medical History:  ?Diagnosis Date  ? Acute bronchitis 06/10/2015  ? Allergic rhinitis   ? Anemia   ? Asthma   ? Carotid stenosis   ? a. s/p Left CEA 2002;  b. Carotid US 3/16:  patent R CEA, L < 40%  ? Chronic pain syndrome   ? Left shoulder, back  ? Diverticulosis   ? Dyslipidemia   ? Epigastric pain 04/05/2016  ? Esophageal stricture   ? Distal, benign  ? GERD (gastroesophageal reflux disease)   ? Hemorrhoids   ? HTN (hypertension)   ? Negative renal duplex  07-22-11  ? Hx of echocardiogram   ? a. Echo 10/11: mod LVH, EF 55-60%  ? Pleural effusion 07/17/2020  ? Prostate cancer ([place]) 2000  ? Seed XRT  ? Spondylosis, cervical, with myelopathy 11/26/2015  ? Stroke [facility]) 8/01  ? right thalamic - on chronic Plavix/ASA  ? ?  ?Social History  ? ?Socioeconomic History  ? Marital status: Married  ?  Spouse name: Not on file  ? Number of children: 1  ? Years of education: 10  ? Highest education level: Not on file  ?Occupational History  ? Occupation: Retired  ?Tobacco Use  ? Smoking status: Former  ?  Packs/day: 0.25  ?  Years: 35.00  ?  Pack years: 8.75  ?  Types: Cigarettes  ?  Start date: 1950  ?  Quit date: 01/12/1978  ?  Years since quitting: 43.2  ? Smokeless tobacco: Current  ?  Types: Chew  ? Tobacco comments:  ?  Started smoking at age 59  ?Vaping Use  ? Vaping Use: Never used  ?Substance and Sexual Activity  ? Alcohol use: No  ?  Comment: Prior alcoholic, sober since 7846  ? Drug use: No  ? Sexual activity: Not Currently  ?Other Topics Concern  ? Not on file  ?Social History Narrative  ? Retired lives at home w/  his wife  ? 1 daughter  ? Right-handed   ? Caffeine: drinks a lot of coffee   ? Former smoker, former alcoholic abstinent for many years  ? ?Social Determinants of Health  ? ?Financial Resource Strain: Not on file  ?Food Insecurity: Not on file  ?Transportation Needs: Not on file  ?Physical Activity: Not on file  ?Stress: Not on file  ?Social Connections: Not on file  ?Intimate Partner Violence: Not on file  ? ? ?Past Surgical History:  ?Procedure Laterality Date  ? ANTERIOR APPROACH HEMI HIP ARTHROPLASTY Right 03/15/2021  ? Procedure: ANTERIOR APPROACH HEMI HIP ARTHROPLASTY;  Surgeon: Rod Can, MD;  Location: [place];  Service: Orthopedics;  Laterality: Right;  ? APPENDECTOMY  1953  ? CAROTID ENDARTERECTOMY Right 01/2000  ? CATARACT EXTRACTION Bilateral   ? CERVICAL DISC SURGERY  8/07  ? CHEST TUBE INSERTION Left 11/11/2020  ? Procedure: INSERTION PLEURAL  DRAINAGE CATHETER;  Surgeon: [name], DO;  Location: [facility];  Service: Pulmonary;  Laterality: Left;  Indwelling Pleural Catheter  ? HIP ARTHROPLASTY Left 10/24/2018  ? Procedure: ARTHROPLASTY BIPOLAR HIP (HEMIARTHROPLASTY);  Surgeon: Paralee Cancel, MD;  Location: [place];  Service: Orthopedics;  Laterality: Left;  ? INSERTION PROSTATE RADIATION SEED  2000  ? [place] SURGERY  1990s  ? NASAL SINUS SURGERY    ? x 3  ? REMOVAL OF PLEURAL DRAINAGE CATHETER N/A 12/24/2020  ? Procedure: MINOR REMOVAL OF PLEURAL DRAINAGE CATHETER;  Surgeon: [name], DO;  Location: [facility];  Service: Cardiopulmonary;  Laterality: N/A;  ? THORACENTESIS N/A 08/15/2020  ? Procedure: THORACENTESIS;  Surgeon: [name], DO;  Location: [place] [facility];  Service: Pulmonary;  Laterality: N/A;  ? ? ?Family History  ?Problem Relation Age of Onset  ? Stroke Brother   ? Stroke Sister   ? Stroke Mother   ? Hyperlipidemia Mother   ? Hypertension Mother   ? Lung disease Father   ? Diabetes Son   ? Gout Son   ? Obesity Sister   ? Alcohol abuse Brother   ? Cancer Brother   ?     lung, smoker  ? ? ?Allergies  ?Allergen Reactions  ? Hctz [Hydrochlorothiazide] Shortness Of Breath, Swelling and Other (See Comments)  ?  "Swelling and dyspnea" ?  ? Hydralazine Shortness Of Breath and Other (See Comments)  ?  Chest pain and GI issues also  ? Diovan [Valsartan] Other (See Comments)  ?  Elevated potassium- Hyperkalemia  ? Metformin And Related Other (See Comments)  ?  Reaction not recalled  ? Nsaids Other (See Comments)  ?  Other than Tylenol, he isn't to have these because he's on Eliquis  ? Other Other (See Comments)  ?  Unnamed topically-applied B/P patch = Caused redness  ? Prednisone Other (See Comments)  ?  Suicidal thoughts  ? Spironolactone Swelling  ?  Site of swelling not recalled  ? Codeine Itching and Nausea Only  ? Oxycodone-Acetaminophen Itching and Nausea Only  ? ? ?Current Outpatient Medications on File Prior to Visit   ?Medication Sig Dispense Refill  ? acetaminophen (TYLENOL) 500 MG tablet Take 1,000 mg by mouth every 6 (six) hours as needed for mild pain or headache.    ? amLODipine (NORVASC) 5 MG tablet TAKE 2 TABLETS BY MOUTH DAILY 180 tablet 1  ? apixaban (ELIQUIS) 5 MG TABS tablet TAKE 1 TABLET BY MOUTH 2 TIMES DAILY (Patient taking differently: Take 5 mg by mouth 2 (two) times  daily.) 60 tablet 5  ? atorvastatin (LIPITOR) 40 MG tablet Take 1 tablet by mouth daily. 90 tablet 0  ? Cholecalciferol (VITAMIN D3 PO) Take 1 tablet by mouth in the morning. Unsure of how many units    ? cloNIDine (CATAPRES) 0.1 MG tablet TAKE 1 TABLET BY MOUTH 2 TIMES DAILY (Patient taking differently: Take 0.1 mg by mouth 2 (two) times daily.) 180 tablet 2  ? COVID-19 mRNA Vac-TriS, Pfizer, SUSP injection Inject into the muscle. 0.3 mL 0  ? docusate sodium (COLACE) 100 MG capsule Take 300 mg by mouth 2 (two) times daily.    ? ferrous sulfate (FERROUSUL) 325 (65 FE) MG tablet Take 1 tablet (325 mg total) by mouth 3 (three) times daily with meals for 14 days. (Patient taking differently: Take 325 mg by mouth in the morning and at bedtime.) 90 tablet 2  ? fluticasone (FLOVENT HFA) 110 MCG/ACT inhaler Inhale 2 puffs into the lungs 2 (two) times daily. (Patient taking differently: Inhale 2 puffs into the lungs 2 (two) times daily as needed (respiratory issues.).) 1 Inhaler 0  ? gabapentin (NEURONTIN) 300 MG capsule TAKE 1 CAPSULE BY MOUTH 3 TIMES DAILY (Patient taking differently: Take 300 mg by mouth at bedtime.) 270 capsule 1  ? lisinopril (ZESTRIL) 2.5 MG tablet TAKE 1 TABLET BY MOUTH DAILY. 90 tablet 0  ? metoCLOPramide (REGLAN) 5 MG tablet TAKE 1 TABLET BY MOUTH AT BEDTIME. (Patient taking differently: Take 5 mg by mouth at bedtime.) 30 tablet 5  ? minoxidil (LONITEN) 10 MG tablet TAKE 1 TABLET BY MOUTH TWICE DAILY (Patient taking differently: Take 10 mg by mouth 2 (two) times daily.) 180 tablet 3  ? ondansetron (ZOFRAN ODT) 8 MG disintegrating  tablet Dissolve 1 tablet by mouth every 8 hours as needed for nausea or vomiting. 20 tablet 0  ? polyvinyl alcohol (LIQUIFILM TEARS) 1.4 % ophthalmic solution Place 1 drop into both eyes 5 (five) times daily as n

## 2021-04-18 LAB — URINE CULTURE
MICRO NUMBER:: 13231215
SPECIMEN QUALITY:: ADEQUATE

## 2021-04-20 NOTE — Addendum Note (Signed)
Addended by: Anabel Halon on: 04/20/2021 09:05 PM ? ? Modules accepted: Orders ? ?

## 2021-04-21 ENCOUNTER — Other Ambulatory Visit (HOSPITAL_COMMUNITY): Payer: Self-pay

## 2021-04-22 ENCOUNTER — Other Ambulatory Visit: Payer: Self-pay

## 2021-04-22 ENCOUNTER — Other Ambulatory Visit (HOSPITAL_COMMUNITY): Payer: Self-pay

## 2021-04-22 ENCOUNTER — Telehealth: Payer: Self-pay | Admitting: Family Medicine

## 2021-04-22 DIAGNOSIS — J45909 Unspecified asthma, uncomplicated: Secondary | ICD-10-CM

## 2021-04-22 MED ORDER — ALBUTEROL SULFATE HFA 108 (90 BASE) MCG/ACT IN AERS
INHALATION_SPRAY | RESPIRATORY_TRACT | 1 refills | Status: DC
  Filled 2021-04-22: qty 18, 25d supply, fill #0

## 2021-04-22 NOTE — Telephone Encounter (Signed)
Albuterol refill sent. Left VM asking for call back in regards to symbicort. ?

## 2021-04-22 NOTE — Telephone Encounter (Signed)
HH stated pt was seen yesterday for global wheezing. They stated his albuterol is expired, but pt does have Symbicort and would like to know if he should use that rx. Pt also hurt his lower back last week taking out the garbage and it is a 3/10 when standing.  ?

## 2021-05-07 ENCOUNTER — Telehealth: Payer: Self-pay | Admitting: Family Medicine

## 2021-05-07 DIAGNOSIS — M9701XD Periprosthetic fracture around internal prosthetic right hip joint, subsequent encounter: Secondary | ICD-10-CM | POA: Diagnosis not present

## 2021-05-07 NOTE — Telephone Encounter (Signed)
[name] called stating that he has been taking four traMADol instead of three on days that his pain is more severe and it has worked with managing that pain. She states that the pt will be running out soon and is wondering if there is any way they can get an early refill on that medication. [name] wants a call back when any information is available on this matter. [place] Advise. ? ?Medication:  ? ?Rx #: [id number]  ?traMADol (ULTRAM) 50 MG tablet [[id number]]  ? ?Has the patient contacted their pharmacy? No. ?(If no, request that the patient contact the pharmacy for the refill.) ?(If yes, when and what did the pharmacy advise?) ? ?Preferred Pharmacy (with phone number or street name):  ? ?[facility]  ?[address] [place] [address]  ?Phone:  [phone]  Fax:  [phone]  ? ?Agent: Please be advised that RX refills may take up to 3 business days. We ask that you follow-up with your pharmacy. ? ?

## 2021-05-08 ENCOUNTER — Other Ambulatory Visit (HOSPITAL_COMMUNITY): Payer: Self-pay

## 2021-05-09 ENCOUNTER — Other Ambulatory Visit (HOSPITAL_COMMUNITY): Payer: Self-pay

## 2021-05-09 ENCOUNTER — Other Ambulatory Visit: Payer: Self-pay | Admitting: Family Medicine

## 2021-05-09 MED ORDER — TRAMADOL HCL 50 MG PO TABS
ORAL_TABLET | ORAL | 0 refills | Status: DC
  Filled 2021-05-09: qty 90, 30d supply, fill #0
  Filled 2021-05-09: qty 90, fill #0
  Filled ????-??-??: fill #0

## 2021-05-09 NOTE — Telephone Encounter (Signed)
PCP sent in refill. Called pharmacy and ave authorization.  ?

## 2021-05-09 NOTE — Telephone Encounter (Signed)
Pharmacy called stating that they needed authorization to fill the pt's traMADol since it is being refilled early. Please Advise. ?

## 2021-05-15 ENCOUNTER — Ambulatory Visit (INDEPENDENT_AMBULATORY_CARE_PROVIDER_SITE_OTHER): Payer: Medicare HMO

## 2021-05-15 VITALS — Ht 70.0 in | Wt 144.0 lb

## 2021-05-15 DIAGNOSIS — Z0000 Encounter for general adult medical examination without abnormal findings: Secondary | ICD-10-CM

## 2021-05-15 NOTE — Progress Notes (Signed)
me ? ?Subjective:  ? [name] is a 88 y.o. male who presents for Medicare Annual/Subsequent preventive examination. ?Virtual Visit via Telephone Note ? ?I connected with  [name] on 05/15/21 at  3:15 PM EDT by telephone and verified that I am speaking with the correct person using two identifiers. ? ?Location: ?Patient:HOME  ?Provider: [facility] ?Persons participating in the virtual visit: patient/Nurse Health Advisor ?  ?I discussed the limitations, risks, security and privacy concerns of performing an evaluation and management service by telephone and the availability of in person appointments. The patient expressed understanding and agreed to proceed. ? ?Interactive audio and video telecommunications were attempted between this nurse and patient, however failed, due to patient having technical difficulties OR patient did not have access to video capability.  We continued and completed visit with audio only. ? ?Some vital signs may be absent or patient reported.  ? ?Scott Driver, LPN ? ?Review of Systems    ? ?Cardiac Risk Factors include: advanced age (>57men, >46 women);hypertension;male gender;sedentary lifestyle;Other (see comment), Risk factor comments: CVA, CHF, Neuroapthy, hip fx. ? ?   ?Objective:  ?  ?Today's Vitals  ? 05/15/21 1457 05/15/21 1519  ?Weight: 144 lb (65.3 kg)   ?Height: 5\' 10"  (1.778 m)   ?PainSc:  5   ? ?Body mass index is 20.66 kg/m?. ? ? ?  05/15/2021  ?  3:21 PM 03/14/2021  ?  8:56 PM 12/24/2020  ?  2:11 PM 11/11/2020  ?  1:32 PM 08/15/2020  ?  1:58 PM 03/07/2019  ?  4:06 PM 10/27/2018  ?  4:00 PM  ?Advanced Directives  ?Does Patient Have a Medical Advance Directive? Yes No;Yes Yes Yes Yes No No  ?Type of Paramedic of [place];Living will Living will [place];Living will Living will [place];Living will    ?Copy of [place] in Chart? Yes - validated most recent copy scanned in chart (See row  information)  Yes - validated most recent copy scanned in chart (See row information)  No - copy requested    ?Would patient like information on creating a medical advance directive?      No - Patient declined No - Patient declined  ? ? ?Current Medications (verified) ?Outpatient Encounter Medications as of 05/15/2021  ?Medication Sig  ? acetaminophen (TYLENOL) 500 MG tablet Take 1,000 mg by mouth every 6 (six) hours as needed for mild pain or headache.  ? albuterol (VENTOLIN HFA) 108 (90 Base) MCG/ACT inhaler INHALE 2 PUFFS BY MOUTH INTO THE LUNGS EVERY 6 HOURS AS NEEDED  ? amLODipine (NORVASC) 5 MG tablet TAKE 2 TABLETS BY MOUTH DAILY  ? apixaban (ELIQUIS) 5 MG TABS tablet TAKE 1 TABLET BY MOUTH 2 TIMES DAILY (Patient taking differently: Take 5 mg by mouth 2 (two) times daily.)  ? atorvastatin (LIPITOR) 40 MG tablet Take 1 tablet by mouth daily.  ? Cholecalciferol (VITAMIN D3 PO) Take 1 tablet by mouth in the morning. Unsure of how many units  ? cloNIDine (CATAPRES) 0.1 MG tablet TAKE 1 TABLET BY MOUTH 2 TIMES DAILY (Patient taking differently: Take 0.1 mg by mouth 2 (two) times daily.)  ? COVID-19 mRNA Vac-TriS, Pfizer, SUSP injection Inject into the muscle.  ? docusate sodium (COLACE) 100 MG capsule Take 300 mg by mouth 2 (two) times daily.  ? ferrous sulfate (FERROUSUL) 325 (65 FE) MG tablet Take 1 tablet (325 mg total) by mouth 3 (three) times  daily with meals for 14 days. (Patient taking differently: Take 325 mg by mouth in the morning and at bedtime.)  ? gabapentin (NEURONTIN) 300 MG capsule TAKE 1 CAPSULE BY MOUTH 3 TIMES DAILY (Patient taking differently: Take 300 mg by mouth at bedtime.)  ? lisinopril (ZESTRIL) 2.5 MG tablet TAKE 1 TABLET BY MOUTH DAILY.  ? metoCLOPramide (REGLAN) 5 MG tablet TAKE 1 TABLET BY MOUTH AT BEDTIME. (Patient taking differently: Take 5 mg by mouth at bedtime.)  ? minoxidil (LONITEN) 10 MG tablet TAKE 1 TABLET BY MOUTH TWICE DAILY (Patient taking differently: Take 10 mg by mouth 2  (two) times daily.)  ? ondansetron (ZOFRAN ODT) 8 MG disintegrating tablet Dissolve 1 tablet by mouth every 8 hours as needed for nausea or vomiting.  ? polyvinyl alcohol (LIQUIFILM TEARS) 1.4 % ophthalmic solution Place 1 drop into both eyes 5 (five) times daily as needed for dry eyes.  ? sennosides-docusate sodium (SENOKOT-S) 8.6-50 MG tablet Take 1-2 tablets by mouth See admin instructions. Take 1 tablet in the morning & take 2 tablet at nigh  ? traMADol (ULTRAM) 50 MG tablet TAKE 1 TABLET BY MOUTH 3 TIMES DAILY AS NEEDED FOR MODERATE PAIN  ? triamcinolone cream (KENALOG) 0.5 % Apply 1 application topically daily as needed (for skin irritation).   ? fluticasone (FLOVENT HFA) 110 MCG/ACT inhaler Inhale 2 puffs into the lungs 2 (two) times daily. (Patient not taking: Reported on 05/15/2021)  ? pantoprazole (PROTONIX) 40 MG tablet TAKE 1 TABLET BY MOUTH ONCE A DAY BEFORE BREAKFAST (Patient taking differently: Take 40 mg by mouth daily.)  ? ?No facility-administered encounter medications on file as of 05/15/2021.  ? ? ?Allergies (verified) ?Hctz [hydrochlorothiazide], Hydralazine, Diovan [valsartan], Metformin and related, Nsaids, Other, Prednisone, Spironolactone, Codeine, and Oxycodone-acetaminophen  ? ?History: ?Past Medical History:  ?Diagnosis Date  ? Acute bronchitis 06/10/2015  ? Allergic rhinitis   ? Anemia   ? Asthma   ? Carotid stenosis   ? a. s/p Left CEA 2002;  b. Carotid US 3/16:  patent R CEA, L < 40%  ? Chronic pain syndrome   ? Left shoulder, back  ? Diverticulosis   ? Dyslipidemia   ? Epigastric pain 04/05/2016  ? Esophageal stricture   ? Distal, benign  ? GERD (gastroesophageal reflux disease)   ? Hemorrhoids   ? HTN (hypertension)   ? Negative renal duplex 07-22-11  ? Hx of echocardiogram   ? a. Echo 10/11: mod LVH, EF 55-60%  ? Pleural effusion 07/17/2020  ? Prostate cancer ([place]) 2000  ? Seed XRT  ? Spondylosis, cervical, with myelopathy 11/26/2015  ? Stroke [facility]) 8/01  ? right thalamic - on chronic  Plavix/ASA  ? ?Past Surgical History:  ?Procedure Laterality Date  ? ANTERIOR APPROACH HEMI HIP ARTHROPLASTY Right 03/15/2021  ? Procedure: ANTERIOR APPROACH HEMI HIP ARTHROPLASTY;  Surgeon: Rod Can, MD;  Location: [place];  Service: Orthopedics;  Laterality: Right;  ? APPENDECTOMY  1953  ? CAROTID ENDARTERECTOMY Right 01/2000  ? CATARACT EXTRACTION Bilateral   ? CERVICAL DISC SURGERY  8/07  ? CHEST TUBE INSERTION Left 11/11/2020  ? Procedure: INSERTION PLEURAL DRAINAGE CATHETER;  Surgeon: [name], DO;  Location: [facility];  Service: Pulmonary;  Laterality: Left;  Indwelling Pleural Catheter  ? HIP ARTHROPLASTY Left 10/24/2018  ? Procedure: ARTHROPLASTY BIPOLAR HIP (HEMIARTHROPLASTY);  Surgeon: Paralee Cancel, MD;  Location: [place];  Service: Orthopedics;  Laterality: Left;  ? INSERTION PROSTATE RADIATION SEED  2000  ? LUMBAR DISC  SURGERY  1990s  ? NASAL SINUS SURGERY    ? x 3  ? REMOVAL OF PLEURAL DRAINAGE CATHETER N/A 12/24/2020  ? Procedure: MINOR REMOVAL OF PLEURAL DRAINAGE CATHETER;  Surgeon: [name], DO;  Location: [facility];  Service: Cardiopulmonary;  Laterality: N/A;  ? THORACENTESIS N/A 08/15/2020  ? Procedure: THORACENTESIS;  Surgeon: [name], DO;  Location: [facility] [facility];  Service: Pulmonary;  Laterality: N/A;  ? ?Family History  ?Problem Relation Age of Onset  ? Stroke Brother   ? Stroke Sister   ? Stroke Mother   ? Hyperlipidemia Mother   ? Hypertension Mother   ? Lung disease Father   ? Diabetes Son   ? Gout Son   ? Obesity Sister   ? Alcohol abuse Brother   ? Cancer Brother   ?     lung, smoker  ? ?Social History  ? ?Socioeconomic History  ? Marital status: Married  ?  Spouse name: Not on file  ? Number of children: 1  ? Years of education: 10  ? Highest education level: Not on file  ?Occupational History  ? Occupation: Retired  ?Tobacco Use  ? Smoking status: Former  ?  Packs/day: 0.25  ?  Years: 35.00  ?  Pack years: 8.75  ?  Types: Cigarettes  ?  Start date: 1950  ?   Quit date: 01/12/1978  ?  Years since quitting: 43.3  ? Smokeless tobacco: Current  ?  Types: Chew  ? Tobacco comments:  ?  Started smoking at age 27  ?Vaping Use  ? Vaping Use: Never used  ?Substance and Sexual

## 2021-05-15 NOTE — Patient Instructions (Signed)
[name] , ?Thank you for taking time to come for your Medicare Wellness Visit. I appreciate your ongoing commitment to your health goals. Please review the following plan we discussed and let me know if I can assist you in the future.  ? ?Screening recommendations/referrals: ?Colonoscopy: No longer required due to age ? ?Recommended yearly ophthalmology/optometry visit for glaucoma screening and checkup ?Recommended yearly dental visit for hygiene and checkup ? ?Vaccinations: ?Influenza vaccine: Due Fall 2023. ?Pneumococcal vaccine: Done 10/05/2013, 12/10/2014 and 01/13/2003 ?Tdap vaccine: Done 12/12/2013 Repeat in 10 years ? ?Shingles vaccine: Done 11/24/2011   ?Covid-19: Done 08/30/2020, 02/27/2019 and 02/05/2019 ? ?Advanced directives: Copies in chart ? ?Conditions/risks identified: Aim for 30 minutes of exercise or brisk walking, 6-8 glasses of water, and 5 servings of fruits and vegetables each day. ? ? ?Next appointment: Follow up in one year for your annual wellness visit. 2024. ? ?Preventive Care 4 Years and Older, Male ? ?Preventive care refers to lifestyle choices and visits with your health care provider that can promote health and wellness. ?What does preventive care include? ?A yearly physical exam. This is also called an annual well check. ?Dental exams once or twice a year. ?Routine eye exams. Ask your health care provider how often you should have your eyes checked. ?Personal lifestyle choices, including: ?Daily care of your teeth and gums. ?Regular physical activity. ?Eating a healthy diet. ?Avoiding tobacco and drug use. ?Limiting alcohol use. ?Practicing safe sex. ?Taking low doses of aspirin every day. ?Taking vitamin and mineral supplements as recommended by your health care provider. ?What happens during an annual well check? ?The services and screenings done by your health care provider during your annual well check will depend on your age, overall health, lifestyle risk factors, and family  history of disease. ?Counseling  ?Your health care provider may ask you questions about your: ?Alcohol use. ?Tobacco use. ?Drug use. ?Emotional well-being. ?Home and relationship well-being. ?Sexual activity. ?Eating habits. ?History of falls. ?Memory and ability to understand (cognition). ?Work and work Statistician. ?Screening  ?You may have the following tests or measurements: ?Height, weight, and BMI. ?Blood pressure. ?Lipid and cholesterol levels. These may be checked every 5 years, or more frequently if you are over 59 years old. ?Skin check. ?Lung cancer screening. You may have this screening every year starting at age 93 if you have a 30-pack-year history of smoking and currently smoke or have quit within the past 15 years. ?Fecal occult blood test (FOBT) of the stool. You may have this test every year starting at age 85. ?Flexible sigmoidoscopy or colonoscopy. You may have a sigmoidoscopy every 5 years or a colonoscopy every 10 years starting at age 10. ?Prostate cancer screening. Recommendations will vary depending on your family history and other risks. ?Hepatitis C blood test. ?Hepatitis B blood test. ?Sexually transmitted disease (STD) testing. ?Diabetes screening. This is done by checking your blood sugar (glucose) after you have not eaten for a while (fasting). You may have this done every 1-3 years. ?Abdominal aortic aneurysm (AAA) screening. You may need this if you are a current or former smoker. ?Osteoporosis. You may be screened starting at age 81 if you are at high risk. ?Talk with your health care provider about your test results, treatment options, and if necessary, the need for more tests. ?Vaccines  ?Your health care provider may recommend certain vaccines, such as: ?Influenza vaccine. This is recommended every year. ?Tetanus, diphtheria, and acellular pertussis (Tdap, Td) vaccine. You may need a Td  booster every 10 years. ?Zoster vaccine. You may need this after age 71. ?Pneumococcal  13-valent conjugate (PCV13) vaccine. One dose is recommended after age 78. ?Pneumococcal polysaccharide (PPSV23) vaccine. One dose is recommended after age 59. ?Talk to your health care provider about which screenings and vaccines you need and how often you need them. ?This information is not intended to replace advice given to you by your health care provider. Make sure you discuss any questions you have with your health care provider. ?Document Released: 01/25/2015 Document Revised: 09/18/2015 Document Reviewed: 10/30/2014 ?Elsevier Interactive Patient Education ? 2017 [place]. ? ?Fall Prevention in the Home ?Falls can cause injuries. They can happen to people of all ages. There are many things you can do to make your home safe and to help prevent falls. ?What can I do on the outside of my home? ?Regularly fix the edges of walkways and driveways and fix any cracks. ?Remove anything that might make you trip as you walk through a door, such as a raised step or threshold. ?Trim any bushes or trees on the path to your home. ?Use bright outdoor lighting. ?Clear any walking paths of anything that might make someone trip, such as rocks or tools. ?Regularly check to see if handrails are loose or broken. Make sure that both sides of any steps have handrails. ?Any raised decks and porches should have guardrails on the edges. ?Have any leaves, snow, or ice cleared regularly. ?Use sand or salt on walking paths during winter. ?Clean up any spills in your garage right away. This includes oil or grease spills. ?What can I do in the bathroom? ?Use night lights. ?Install grab bars by the toilet and in the tub and shower. Do not use towel bars as grab bars. ?Use non-skid mats or decals in the tub or shower. ?If you need to sit down in the shower, use a plastic, non-slip stool. ?Keep the floor dry. Clean up any water that spills on the floor as soon as it happens. ?Remove soap buildup in the tub or shower regularly. ?Attach  bath mats securely with double-sided non-slip rug tape. ?Do not have throw rugs and other things on the floor that can make you trip. ?What can I do in the bedroom? ?Use night lights. ?Make sure that you have a light by your bed that is easy to reach. ?Do not use any sheets or blankets that are too big for your bed. They should not hang down onto the floor. ?Have a firm chair that has side arms. You can use this for support while you get dressed. ?Do not have throw rugs and other things on the floor that can make you trip. ?What can I do in the kitchen? ?Clean up any spills right away. ?Avoid walking on wet floors. ?Keep items that you use a lot in easy-to-reach places. ?If you need to reach something above you, use a strong step stool that has a grab bar. ?Keep electrical cords out of the way. ?Do not use floor polish or wax that makes floors slippery. If you must use wax, use non-skid floor wax. ?Do not have throw rugs and other things on the floor that can make you trip. ?What can I do with my stairs? ?Do not leave any items on the stairs. ?Make sure that there are handrails on both sides of the stairs and use them. Fix handrails that are broken or loose. Make sure that handrails are as long as the stairways. ?Check any carpeting  to make sure that it is firmly attached to the stairs. Fix any carpet that is loose or worn. ?Avoid having throw rugs at the top or bottom of the stairs. If you do have throw rugs, attach them to the floor with carpet tape. ?Make sure that you have a light switch at the top of the stairs and the bottom of the stairs. If you do not have them, ask someone to add them for you. ?What else can I do to help prevent falls? ?Wear shoes that: ?Do not have high heels. ?Have rubber bottoms. ?Are comfortable and fit you well. ?Are closed at the toe. Do not wear sandals. ?If you use a stepladder: ?Make sure that it is fully opened. Do not climb a closed stepladder. ?Make sure that both sides of the  stepladder are locked into place. ?Ask someone to hold it for you, if possible. ?Clearly mark and make sure that you can see: ?Any grab bars or handrails. ?First and last steps. ?Where the edge of each step is. ?Use t

## 2021-05-19 ENCOUNTER — Telehealth: Payer: Self-pay | Admitting: Family Medicine

## 2021-05-19 ENCOUNTER — Other Ambulatory Visit (HOSPITAL_COMMUNITY): Payer: Self-pay

## 2021-05-19 DIAGNOSIS — R531 Weakness: Secondary | ICD-10-CM

## 2021-05-19 NOTE — Telephone Encounter (Signed)
Caller/Agency: [facility] ?Callback Number: [phone] ?Requesting OT/PT/Skilled Nursing/Social Work/Speech Therapy: PT ?Frequency: Pt is getting discharged at home health PT. Needs referral sent to [facility]  ?

## 2021-05-20 NOTE — Telephone Encounter (Signed)
Referral entered  

## 2021-05-26 ENCOUNTER — Other Ambulatory Visit (HOSPITAL_COMMUNITY): Payer: Self-pay

## 2021-05-26 ENCOUNTER — Other Ambulatory Visit: Payer: Self-pay | Admitting: Internal Medicine

## 2021-05-26 DIAGNOSIS — I480 Paroxysmal atrial fibrillation: Secondary | ICD-10-CM

## 2021-05-26 DIAGNOSIS — M25551 Pain in right hip: Secondary | ICD-10-CM | POA: Diagnosis not present

## 2021-05-26 MED ORDER — APIXABAN 5 MG PO TABS
ORAL_TABLET | Freq: Two times a day (BID) | ORAL | 5 refills | Status: DC
  Filled 2021-05-26: qty 60, 30d supply, fill #0
  Filled 2021-06-29: qty 60, 30d supply, fill #1
  Filled 2021-07-21: qty 60, 30d supply, fill #2
  Filled 2021-08-25: qty 60, 30d supply, fill #3
  Filled 2021-09-30 – 2022-04-29 (×4): qty 60, 30d supply, fill #4

## 2021-05-26 NOTE — Telephone Encounter (Signed)
Eliquis 5mg  refill request received. Patient is 88 years old, weight-65.3kg, Crea-1.08 on 04/17/2021, Diagnosis-Afib, and last seen by Dr. Harrington Challenger on 08/27/2020. Dose is appropriate based on dosing criteria. Will send in refill to requested pharmacy.   ?

## 2021-05-30 ENCOUNTER — Other Ambulatory Visit (HOSPITAL_COMMUNITY): Payer: Self-pay

## 2021-06-04 DIAGNOSIS — M25551 Pain in right hip: Secondary | ICD-10-CM | POA: Diagnosis not present

## 2021-06-05 ENCOUNTER — Other Ambulatory Visit (HOSPITAL_COMMUNITY): Payer: Self-pay

## 2021-06-05 ENCOUNTER — Other Ambulatory Visit: Payer: Self-pay | Admitting: Family Medicine

## 2021-06-05 MED ORDER — TRAMADOL HCL 50 MG PO TABS
ORAL_TABLET | ORAL | 0 refills | Status: DC
  Filled 2021-06-05: qty 90, 30d supply, fill #0

## 2021-06-05 NOTE — Telephone Encounter (Signed)
Requesting: tramadol 50mg   Contract: 07/16/20 UDS: 07/16/20 Last Visit: 04/17/21 Next Visit: 10/07/21 Last Refill: 05/09/21  Please Advise

## 2021-06-06 ENCOUNTER — Other Ambulatory Visit (HOSPITAL_COMMUNITY): Payer: Self-pay

## 2021-06-10 DIAGNOSIS — M25551 Pain in right hip: Secondary | ICD-10-CM | POA: Diagnosis not present

## 2021-06-12 DIAGNOSIS — M25551 Pain in right hip: Secondary | ICD-10-CM | POA: Diagnosis not present

## 2021-06-13 ENCOUNTER — Other Ambulatory Visit (HOSPITAL_COMMUNITY): Payer: Self-pay

## 2021-06-14 ENCOUNTER — Other Ambulatory Visit (HOSPITAL_COMMUNITY): Payer: Self-pay

## 2021-06-16 ENCOUNTER — Other Ambulatory Visit (HOSPITAL_COMMUNITY): Payer: Self-pay

## 2021-06-24 DIAGNOSIS — M25551 Pain in right hip: Secondary | ICD-10-CM | POA: Diagnosis not present

## 2021-06-25 ENCOUNTER — Other Ambulatory Visit: Payer: Self-pay | Admitting: Family Medicine

## 2021-06-25 ENCOUNTER — Other Ambulatory Visit (HOSPITAL_COMMUNITY): Payer: Self-pay

## 2021-06-25 DIAGNOSIS — I10 Essential (primary) hypertension: Secondary | ICD-10-CM

## 2021-06-25 DIAGNOSIS — I6350 Cerebral infarction due to unspecified occlusion or stenosis of unspecified cerebral artery: Secondary | ICD-10-CM

## 2021-06-25 DIAGNOSIS — E559 Vitamin D deficiency, unspecified: Secondary | ICD-10-CM

## 2021-06-25 DIAGNOSIS — E782 Mixed hyperlipidemia: Secondary | ICD-10-CM

## 2021-06-26 ENCOUNTER — Other Ambulatory Visit (HOSPITAL_COMMUNITY): Payer: Self-pay

## 2021-06-26 ENCOUNTER — Telehealth: Payer: Self-pay | Admitting: *Deleted

## 2021-06-26 DIAGNOSIS — M5416 Radiculopathy, lumbar region: Secondary | ICD-10-CM | POA: Diagnosis not present

## 2021-06-26 MED ORDER — ATORVASTATIN CALCIUM 40 MG PO TABS
40.0000 mg | ORAL_TABLET | Freq: Every day | ORAL | 0 refills | Status: DC
  Filled 2021-06-26: qty 90, 90d supply, fill #0

## 2021-06-26 NOTE — Telephone Encounter (Signed)
   Pre-operative Risk Assessment    Patient Name: [name]  DOB: [birth year] MRN: [id number]      Request for Surgical Clearance    Procedure:   RIGHT L5 SELECTIVE NERVE BLOCK  Date of Surgery:  Clearance TBD                                 Surgeon:  DR. Suella Broad Surgeon's Group or Practice Name:  [facility] Phone number:  [phone] Fax number:  [phone]   Type of Clearance Requested:   - Medical ; PER NOTES FROM DR. RAMOS THIS PROCEDURE WILL NOT REQUIRE THE PT TO HOLD HIS ELIQUIS   Type of Anesthesia:  Not Indicated   Additional requests/questions:    [name]   06/26/2021, 4:40 PM

## 2021-06-27 NOTE — Telephone Encounter (Signed)
I s/w the pt and his wife about appt for pre op clearance. [name] asked for in office appt instead of tele appt. [name] states the pt has been sleeping too much and his HR has been running low, today 46. Pt will also need pre op clearance as well . I will update the requesting office the pt has appt 07/01/21 with Dr. Harrington Challenger.

## 2021-06-27 NOTE — Telephone Encounter (Signed)
Preoperative team, please contact this patient and set up a phone call appointment for further cardiac evaluation.  Thank you for your help.  [name]. Daltin Crist NP-C    06/27/2021, 9:45 AM [place] [address] Office [phone] Fax [phone]

## 2021-06-27 NOTE — Telephone Encounter (Signed)
Left message with the pt's wife who states the pt is sleeping right now. I asked for her to have the pt call back and ask to s/w the pre op team for a tele pre op appt

## 2021-06-29 ENCOUNTER — Other Ambulatory Visit: Payer: Self-pay | Admitting: Internal Medicine

## 2021-06-30 ENCOUNTER — Other Ambulatory Visit (HOSPITAL_COMMUNITY): Payer: Self-pay

## 2021-06-30 DIAGNOSIS — M25551 Pain in right hip: Secondary | ICD-10-CM | POA: Diagnosis not present

## 2021-06-30 MED ORDER — METOCLOPRAMIDE HCL 5 MG PO TABS
ORAL_TABLET | Freq: Every day | ORAL | 5 refills | Status: DC
Start: 2021-06-30 — End: 2022-01-09
  Filled 2021-06-30: qty 30, 30d supply, fill #0
  Filled 2021-08-06: qty 30, 30d supply, fill #1
  Filled 2021-08-31: qty 30, 30d supply, fill #2
  Filled 2021-10-06: qty 30, 30d supply, fill #3
  Filled 2021-11-10: qty 30, 30d supply, fill #4
  Filled 2021-12-08: qty 30, 30d supply, fill #5

## 2021-06-30 NOTE — Progress Notes (Unsigned)
Cardiology Office Note   Date:  07/01/2021   ID:  [name], DOB [birth year], MRN [id number]  PCP:  [name], MD  Cardiologist:   [name], MD   F/U of HTN   History of Present Illness: [name] is a 88 y.o. male with a history of HTN (labile), CV dz (s/p L CEA 2002), HL, GERD, reactive airway dz, esophageal stricture, and PAF   Echo in 2020 LVEF and RVEF normal   I saw the pt in November  2021     The pt has been seen by pulmonary for pleural effusion   Thoracentesis in July 2022   Cytology neg for malignancy, lymphocytes  Concern for possible malignancy   Effusoin recurred    Diuresed with low dose lasix  Underwent repeat thoracentesis of 2 L on 8/4 again of amber colored fluid, cytology negative    I saw the pt in Aug 2022  The pt's breathing is good    He denies CP   No dizziness     Troubled by back pain   Due to have injection soon in PMR    Current Meds  Medication Sig   acetaminophen (TYLENOL) 500 MG tablet Take 1,000 mg by mouth every 6 (six) hours as needed for mild pain or headache.   albuterol (VENTOLIN HFA) 108 (90 Base) MCG/ACT inhaler INHALE 2 PUFFS BY MOUTH INTO THE LUNGS EVERY 6 HOURS AS NEEDED   amLODipine (NORVASC) 5 MG tablet TAKE 2 TABLETS BY MOUTH DAILY   apixaban (ELIQUIS) 5 MG TABS tablet TAKE 1 TABLET BY MOUTH 2 TIMES DAILY   atorvastatin (LIPITOR) 40 MG tablet Take 1 tablet by mouth daily.   Cholecalciferol (VITAMIN D3 PO) Take 1 tablet by mouth in the morning. Unsure of how many units   cloNIDine (CATAPRES) 0.1 MG tablet TAKE 1 TABLET BY MOUTH 2 TIMES DAILY   docusate sodium (COLACE) 100 MG capsule Take 300 mg by mouth 2 (two) times daily.   ferrous sulfate (FERROUSUL) 325 (65 FE) MG tablet Take 1 tablet (325 mg total) by mouth 3 (three) times daily with meals for 14 days. (Patient taking differently: Take 325 mg by mouth in the morning and at bedtime.)   gabapentin (NEURONTIN) 300 MG capsule TAKE 1 CAPSULE BY MOUTH 3 TIMES DAILY (Patient  taking differently: Take 300 mg by mouth at bedtime.)   lisinopril (ZESTRIL) 2.5 MG tablet TAKE 1 TABLET BY MOUTH DAILY.   metoCLOPramide (REGLAN) 5 MG tablet TAKE 1 TABLET BY MOUTH AT BEDTIME.   minoxidil (LONITEN) 10 MG tablet TAKE 1 TABLET BY MOUTH TWICE DAILY   ondansetron (ZOFRAN ODT) 8 MG disintegrating tablet Dissolve 1 tablet by mouth every 8 hours as needed for nausea or vomiting.   pantoprazole (PROTONIX) 40 MG tablet TAKE 1 TABLET BY MOUTH ONCE A DAY BEFORE BREAKFAST   polyvinyl alcohol (LIQUIFILM TEARS) 1.4 % ophthalmic solution Place 1 drop into both eyes 5 (five) times daily as needed for dry eyes.   sennosides-docusate sodium (SENOKOT-S) 8.6-50 MG tablet Take 1-2 tablets by mouth See admin instructions. Take 1 tablet in the morning & take 2 tablet at nigh   traMADol (ULTRAM) 50 MG tablet TAKE 1 TABLET BY MOUTH 3 TIMES DAILY AS NEEDED FOR MODERATE PAIN   triamcinolone cream (KENALOG) 0.5 % Apply 1 application topically daily as needed (for skin irritation).      Allergies:   Hctz [hydrochlorothiazide], Hydralazine, Diovan [valsartan], Metformin and related, Nsaids, Other, Prednisone,  Spironolactone, Codeine, and Oxycodone-acetaminophen   Past Medical History:  Diagnosis Date   Acute bronchitis 06/10/2015   Allergic rhinitis    Anemia    Asthma    Carotid stenosis    a. s/p Left CEA 2002;  b. Carotid US 3/16:  patent R CEA, L < 40%   Chronic pain syndrome    Left shoulder, back   Diverticulosis    Dyslipidemia    Epigastric pain 04/05/2016   Esophageal stricture    Distal, benign   GERD (gastroesophageal reflux disease)    Hemorrhoids    HTN (hypertension)    Negative renal duplex 07-22-11   Hx of echocardiogram    a. Echo 10/11: mod LVH, EF 55-60%   Pleural effusion 07/17/2020   Prostate cancer ([place]) 2000   Seed XRT   Spondylosis, cervical, with myelopathy 11/26/2015   Stroke ([place]) 8/01   right thalamic - on chronic Plavix/ASA    Past Surgical History:  Procedure  Laterality Date   ANTERIOR APPROACH HEMI HIP ARTHROPLASTY Right 03/15/2021   Procedure: ANTERIOR APPROACH HEMI HIP ARTHROPLASTY;  Surgeon: Rod Can, MD;  Location: [facility];  Service: Orthopedics;  Laterality: Right;   APPENDECTOMY  1953   CAROTID ENDARTERECTOMY Right 01/2000   CATARACT EXTRACTION Bilateral    CERVICAL [place] SURGERY  8/07   CHEST TUBE INSERTION Left 11/11/2020   Procedure: INSERTION PLEURAL DRAINAGE CATHETER;  Surgeon: [name], DO;  Location: [facility];  Service: Pulmonary;  Laterality: Left;  Indwelling Pleural Catheter   HIP ARTHROPLASTY Left 10/24/2018   Procedure: ARTHROPLASTY BIPOLAR HIP (HEMIARTHROPLASTY);  Surgeon: Paralee Cancel, MD;  Location: [facility];  Service: Orthopedics;  Laterality: Left;   INSERTION PROSTATE RADIATION SEED  2000   LUMBAR DISC SURGERY  1990s   NASAL SINUS SURGERY     x 3   REMOVAL OF PLEURAL DRAINAGE CATHETER N/A 12/24/2020   Procedure: MINOR REMOVAL OF PLEURAL DRAINAGE CATHETER;  Surgeon: [name], DO;  Location: [facility];  Service: Cardiopulmonary;  Laterality: N/A;   THORACENTESIS N/A 08/15/2020   Procedure: THORACENTESIS;  Surgeon: [name], DO;  Location: [facility];  Service: Pulmonary;  Laterality: N/A;     Social History:  The patient  reports that he quit smoking about 43 years ago. His smoking use included cigarettes. He started smoking about 73 years ago. He has a 8.75 pack-year smoking history. His smokeless tobacco use includes chew. He reports that he does not drink alcohol and does not use drugs.   Family History:  The patient's family history includes Alcohol abuse in his brother; Cancer in his brother; Diabetes in his son; Gout in his son; Hyperlipidemia in his mother; Hypertension in his mother; Lung disease in his father; Obesity in his sister; Stroke in his brother, mother, and sister.    ROS:  Please see the history of present illness. All other systems are reviewed and  Negative to the above  problem except as noted.    PHYSICAL EXAM: VS:  BP (!) 126/54   Pulse 62   Ht 5\' 11"  (1.803 m)   Wt 136 lb (61.7 kg)   SpO2 97%   BMI 18.97 kg/m   GEN: Thin 88 yo in no acute distress  HEENT: normal  Neck: no JVD Cardiac: RRR; no murmur;   Trivial LE edema  Respiratory: Moving air OK  NO wheezes    GI: soft, nontender, nondistended, + BS   MS: no deformity Moving all extremities   Skin:  warm and dry, no rash Neuro:  Strength and sensation are intact Psych: euthymic mood, full affect   EKG:  EKG is not ordered today         Lipid Panel    Component Value Date/Time   CHOL 110 04/17/2021 1132   TRIG 88.0 04/17/2021 1132   HDL 43.00 04/17/2021 1132   CHOLHDL 3 04/17/2021 1132   VLDL 17.6 04/17/2021 1132   LDLCALC 50 04/17/2021 1132      Wt Readings from Last 3 Encounters:  07/01/21 136 lb (61.7 kg)  05/15/21 144 lb (65.3 kg)  04/17/21 144 lb (65.3 kg)      ASSESSMENT AND PLAN:  1 HTN   BP is controlled   Keep on current regimen    2  PAF  Pt clinically in SR    Denies palpitations.   Keep on Eliquis      3  Pleural effusion.  Seen in pulmonary  Recurrent.  PET scan unrevealing except for small spot in sigmoid colon   Plan for conservative Rx.  CXR in march showed small L effusion.   Noted RUL airspace dz.   (Edema vs pneumonia) Moving air ok     3  CV dz   Mild dz bilaterally   4  Hx CVAs   Seen in CT   Keep on anticoagulation   4   HL  Keep on lipitor  LDL 50  HDL 43    5   Preop eval   Plan for injection to back  Low risk from cardiac standpoint   Plan is not to stop Eliquis  for injection  6  ANema   Will check CBC today   7  Fatigue   [name] says he sleeps a lot  Will check CBC, TSH    Add CMET    Follow up in Feb.       Current medicines are reviewed at length with the patient today.  The patient does not have concerns regarding medicines.  Signed, [name], MD  07/01/2021 6:06 PM    [facility] [address], [place], [place]  [address] Phone: [phone]; Fax: [phone]

## 2021-07-01 ENCOUNTER — Encounter: Payer: Self-pay | Admitting: Internal Medicine

## 2021-07-01 ENCOUNTER — Telehealth: Payer: Medicare HMO

## 2021-07-01 ENCOUNTER — Ambulatory Visit: Payer: Medicare HMO | Admitting: Internal Medicine

## 2021-07-01 VITALS — BP 126/54 | HR 62 | Ht 71.0 in | Wt 136.0 lb

## 2021-07-01 DIAGNOSIS — R0602 Shortness of breath: Secondary | ICD-10-CM

## 2021-07-01 DIAGNOSIS — E1142 Type 2 diabetes mellitus with diabetic polyneuropathy: Secondary | ICD-10-CM | POA: Diagnosis not present

## 2021-07-01 DIAGNOSIS — D649 Anemia, unspecified: Secondary | ICD-10-CM | POA: Diagnosis not present

## 2021-07-01 NOTE — Patient Instructions (Addendum)
Medication Instructions:  Your physician recommends that you continue on your current medications as directed. Please refer to the Current Medication list given to you today. *If you need a refill on your cardiac medications before your next appointment, please call your pharmacy*   Lab Work: TODAY-CMET, CBC, TSH If you have labs (blood work) drawn today and your tests are completely normal, you will receive your results only by: [place] (if you have MyChart) OR A paper copy in the mail If you have any lab test that is abnormal or we need to change your treatment, we will call you to review the results.   Testing/Procedures: NONE ORDERED   Follow-Up: At [facility], you and your health needs are our priority.  As part of our continuing mission to provide you with exceptional heart care, we have created designated Provider Care Teams.  These Care Teams include your primary Cardiologist (physician) and Advanced Practice Providers (APPs -  Physician Assistants and Nurse Practitioners) who all work together to provide you with the care you need, when you need it.  We recommend signing up for the patient portal called "MyChart".  Sign up information is provided on this After Visit Summary.  MyChart is used to connect with patients for Virtual Visits (Telemedicine).  Patients are able to view lab/test results, encounter notes, upcoming appointments, etc.  Non-urgent messages can be sent to your provider as well.   To learn more about what you can do with MyChart, go to [email or web address].    Your next appointment:   FEBRUARY  2024  The format for your next appointment:   In Person  Provider:   [name], MD     Other Instructions   Important Information About Sugar      .

## 2021-07-02 DIAGNOSIS — M25551 Pain in right hip: Secondary | ICD-10-CM | POA: Diagnosis not present

## 2021-07-02 LAB — COMPREHENSIVE METABOLIC PANEL
ALT: 5 IU/L (ref 0–44)
AST: 10 IU/L (ref 0–40)
Albumin/Globulin Ratio: 1.9 (ref 1.2–2.2)
Albumin: 3.9 g/dL (ref 3.6–4.6)
Alkaline Phosphatase: 61 IU/L (ref 44–121)
BUN/Creatinine Ratio: 25 — ABNORMAL HIGH (ref 10–24)
BUN: 31 mg/dL — ABNORMAL HIGH (ref 8–27)
Bilirubin Total: <0.2 mg/dL (ref 0.0–1.2)
CO2: 21 mmol/L (ref 20–29)
Calcium: 8.8 mg/dL (ref 8.6–10.2)
Chloride: 104 mmol/L (ref 96–106)
Creatinine, Ser: 1.26 mg/dL (ref 0.76–1.27)
Globulin, Total: 2.1 g/dL (ref 1.5–4.5)
Glucose: 110 mg/dL — ABNORMAL HIGH (ref 70–99)
Potassium: 5.3 mmol/L — ABNORMAL HIGH (ref 3.5–5.2)
Sodium: 139 mmol/L (ref 134–144)
Total Protein: 6 g/dL (ref 6.0–8.5)
eGFR: 55 mL/min/{1.73_m2} — ABNORMAL LOW (ref >59)

## 2021-07-02 LAB — TSH: TSH: 2.45 u[IU]/mL (ref 0.450–4.500)

## 2021-07-02 LAB — CBC
Hematocrit: 35.5 % — ABNORMAL LOW (ref 37.5–51.0)
Hemoglobin: 11.7 g/dL — ABNORMAL LOW (ref 13.0–17.7)
MCH: 29.5 pg (ref 26.6–33.0)
MCHC: 33 g/dL (ref 31.5–35.7)
MCV: 90 fL (ref 79–97)
Platelets: 304 10*3/uL (ref 150–450)
RBC: 3.96 x10E6/uL — ABNORMAL LOW (ref 4.14–5.80)
RDW: 14.8 % (ref 11.6–15.4)
WBC: 6.7 10*3/uL (ref 3.4–10.8)

## 2021-07-03 ENCOUNTER — Telehealth: Payer: Self-pay

## 2021-07-03 ENCOUNTER — Other Ambulatory Visit (HOSPITAL_COMMUNITY): Payer: Self-pay

## 2021-07-03 ENCOUNTER — Other Ambulatory Visit: Payer: Self-pay | Admitting: Family Medicine

## 2021-07-03 DIAGNOSIS — E875 Hyperkalemia: Secondary | ICD-10-CM

## 2021-07-03 DIAGNOSIS — Z79899 Other long term (current) drug therapy: Secondary | ICD-10-CM

## 2021-07-03 MED ORDER — TRAMADOL HCL 50 MG PO TABS
ORAL_TABLET | ORAL | 0 refills | Status: DC
  Filled 2021-07-03 – 2021-07-04 (×2): qty 90, 30d supply, fill #0

## 2021-07-03 NOTE — Telephone Encounter (Signed)
-----   Message from [name] V, MD sent at 07/02/2021  9:05 AM EDT ----- Hgb is mildly down, better than previous  Thyroid function is normal Kidney function is down a little bit   Stay hydrated Potassium is minimally increassed.   Limtt high K foods, (bananas, tomatoes) a little   No salt substitute.   REpeat BMET in 2 wks

## 2021-07-03 NOTE — Telephone Encounter (Signed)
Requesting: tramadol 50mg   Contract: 07/16/20 UDS: 07/16/20 Last Visit: 04/17/21 Next Visit: 10/07/21 Last Refill:06/05/21 #90 and 0RF  Please Advise

## 2021-07-03 NOTE — Telephone Encounter (Signed)
Pt to have repeat BMET 07/17/21.

## 2021-07-04 ENCOUNTER — Other Ambulatory Visit (HOSPITAL_COMMUNITY): Payer: Self-pay

## 2021-07-08 DIAGNOSIS — M25551 Pain in right hip: Secondary | ICD-10-CM | POA: Diagnosis not present

## 2021-07-10 ENCOUNTER — Other Ambulatory Visit (HOSPITAL_COMMUNITY): Payer: Self-pay

## 2021-07-10 DIAGNOSIS — M5416 Radiculopathy, lumbar region: Secondary | ICD-10-CM | POA: Diagnosis not present

## 2021-07-11 ENCOUNTER — Other Ambulatory Visit (HOSPITAL_COMMUNITY): Payer: Self-pay

## 2021-07-12 ENCOUNTER — Other Ambulatory Visit: Payer: Self-pay | Admitting: Family Medicine

## 2021-07-14 ENCOUNTER — Other Ambulatory Visit (HOSPITAL_COMMUNITY): Payer: Self-pay

## 2021-07-14 MED ORDER — LISINOPRIL 2.5 MG PO TABS
ORAL_TABLET | Freq: Every day | ORAL | 0 refills | Status: DC
  Filled 2021-07-14: qty 90, 90d supply, fill #0

## 2021-07-16 ENCOUNTER — Other Ambulatory Visit (HOSPITAL_COMMUNITY): Payer: Self-pay

## 2021-07-17 ENCOUNTER — Other Ambulatory Visit: Payer: Medicare HMO

## 2021-07-18 ENCOUNTER — Other Ambulatory Visit: Payer: Medicare HMO | Admitting: *Deleted

## 2021-07-18 DIAGNOSIS — E875 Hyperkalemia: Secondary | ICD-10-CM | POA: Diagnosis not present

## 2021-07-18 DIAGNOSIS — Z79899 Other long term (current) drug therapy: Secondary | ICD-10-CM

## 2021-07-19 LAB — BASIC METABOLIC PANEL
BUN/Creatinine Ratio: 19 (ref 10–24)
BUN: 21 mg/dL (ref 8–27)
CO2: 21 mmol/L (ref 20–29)
Calcium: 8.6 mg/dL (ref 8.6–10.2)
Chloride: 107 mmol/L — ABNORMAL HIGH (ref 96–106)
Creatinine, Ser: 1.13 mg/dL (ref 0.76–1.27)
Glucose: 147 mg/dL — ABNORMAL HIGH (ref 70–99)
Potassium: 4.6 mmol/L (ref 3.5–5.2)
Sodium: 140 mmol/L (ref 134–144)
eGFR: 62 mL/min/{1.73_m2} (ref >59)

## 2021-07-21 ENCOUNTER — Telehealth: Payer: Self-pay

## 2021-07-21 ENCOUNTER — Other Ambulatory Visit (HOSPITAL_COMMUNITY): Payer: Self-pay

## 2021-07-21 NOTE — Telephone Encounter (Signed)
Pt called to f/u with BMET lab results.   Pt sleeping; spouse, Gianlucca Szymborski answered phone.    BMET normal electrolytes and kidney function reported to spouse.     Advised to f/u with provider at next appointment, and reach out if any condition changes or questions. Mrs. Menendez understood.

## 2021-07-22 ENCOUNTER — Other Ambulatory Visit (HOSPITAL_COMMUNITY): Payer: Self-pay

## 2021-07-22 DIAGNOSIS — M25551 Pain in right hip: Secondary | ICD-10-CM | POA: Diagnosis not present

## 2021-07-29 ENCOUNTER — Other Ambulatory Visit: Payer: Self-pay | Admitting: Family Medicine

## 2021-07-29 ENCOUNTER — Other Ambulatory Visit (HOSPITAL_COMMUNITY): Payer: Self-pay

## 2021-07-30 MED ORDER — TRAMADOL HCL 50 MG PO TABS
ORAL_TABLET | ORAL | 0 refills | Status: DC
  Filled 2021-07-30: qty 90, 30d supply, fill #0

## 2021-07-30 NOTE — Telephone Encounter (Signed)
Requesting: tramadol 50mg  Contract: 07/16/20 UDS: 07/16/20 Last Visit: 04/17/21 Next Visit: 10/07/21 Last Refill: 07/03/21  Please Advise

## 2021-07-31 ENCOUNTER — Other Ambulatory Visit (HOSPITAL_COMMUNITY): Payer: Self-pay

## 2021-08-01 ENCOUNTER — Other Ambulatory Visit: Payer: Self-pay

## 2021-08-01 ENCOUNTER — Other Ambulatory Visit (HOSPITAL_COMMUNITY): Payer: Self-pay

## 2021-08-01 DIAGNOSIS — Z96649 Presence of unspecified artificial hip joint: Secondary | ICD-10-CM | POA: Diagnosis not present

## 2021-08-01 DIAGNOSIS — Z7901 Long term (current) use of anticoagulants: Secondary | ICD-10-CM | POA: Diagnosis not present

## 2021-08-01 DIAGNOSIS — R001 Bradycardia, unspecified: Secondary | ICD-10-CM | POA: Diagnosis not present

## 2021-08-01 DIAGNOSIS — I444 Left anterior fascicular block: Secondary | ICD-10-CM | POA: Diagnosis not present

## 2021-08-01 DIAGNOSIS — I10 Essential (primary) hypertension: Secondary | ICD-10-CM | POA: Diagnosis not present

## 2021-08-01 DIAGNOSIS — R079 Chest pain, unspecified: Secondary | ICD-10-CM | POA: Diagnosis not present

## 2021-08-01 DIAGNOSIS — I4510 Unspecified right bundle-branch block: Secondary | ICD-10-CM | POA: Diagnosis not present

## 2021-08-01 DIAGNOSIS — Z79899 Other long term (current) drug therapy: Secondary | ICD-10-CM | POA: Diagnosis not present

## 2021-08-01 DIAGNOSIS — Z8546 Personal history of malignant neoplasm of prostate: Secondary | ICD-10-CM | POA: Diagnosis not present

## 2021-08-01 DIAGNOSIS — R0789 Other chest pain: Secondary | ICD-10-CM | POA: Diagnosis not present

## 2021-08-01 DIAGNOSIS — I2109 ST elevation (STEMI) myocardial infarction involving other coronary artery of anterior wall: Secondary | ICD-10-CM | POA: Diagnosis not present

## 2021-08-01 DIAGNOSIS — R072 Precordial pain: Secondary | ICD-10-CM | POA: Diagnosis not present

## 2021-08-01 DIAGNOSIS — R0602 Shortness of breath: Secondary | ICD-10-CM | POA: Diagnosis not present

## 2021-08-01 DIAGNOSIS — I491 Atrial premature depolarization: Secondary | ICD-10-CM | POA: Diagnosis not present

## 2021-08-01 DIAGNOSIS — R9431 Abnormal electrocardiogram [ECG] [EKG]: Secondary | ICD-10-CM | POA: Diagnosis not present

## 2021-08-01 DIAGNOSIS — Z87891 Personal history of nicotine dependence: Secondary | ICD-10-CM | POA: Diagnosis not present

## 2021-08-05 ENCOUNTER — Telehealth: Payer: Self-pay | Admitting: Family Medicine

## 2021-08-05 DIAGNOSIS — D649 Anemia, unspecified: Secondary | ICD-10-CM

## 2021-08-05 NOTE — Telephone Encounter (Signed)
[name] stated she spoke with [facility] Friday and she advised her that we would call her back Monday. She has not heard anything and was wanting something to help with his hemoglobin levels.   [address], [place] [address]  Phone:  [phone]  Fax:  [phone]

## 2021-08-06 ENCOUNTER — Other Ambulatory Visit (HOSPITAL_COMMUNITY): Payer: Self-pay

## 2021-08-06 NOTE — Telephone Encounter (Signed)
[name] is asking for something to help with his hemoglobin levels. Looks like pt's levels have been consistent for awhile but he recently had visit to the ED for chest pain.

## 2021-08-07 ENCOUNTER — Other Ambulatory Visit (HOSPITAL_COMMUNITY): Payer: Self-pay

## 2021-08-07 ENCOUNTER — Telehealth: Payer: Self-pay | Admitting: Internal Medicine

## 2021-08-07 MED ORDER — FERROUS FUMARATE 324 (106 FE) MG PO TABS
1.0000 | ORAL_TABLET | Freq: Two times a day (BID) | ORAL | 2 refills | Status: AC
  Filled 2021-08-07: qty 90, 45d supply, fill #0

## 2021-08-07 NOTE — Telephone Encounter (Signed)
Scheduled appt per 7/27 referral. Pt is aware of appt date and time. Pt is aware to arrive 15 mins prior to appt time and to bring and updated insurance card. Pt is aware of appt location.

## 2021-08-07 NOTE — Telephone Encounter (Signed)
Spoke with pt's wife. Will mail ifob. They are currently out of town and will contact us when they return for his repeat lab work. Future labs ordered.

## 2021-08-07 NOTE — Addendum Note (Signed)
Addended by: Lynnea Ferrier R on: 08/07/2021 04:39 PM   Modules accepted: Orders

## 2021-08-07 NOTE — Telephone Encounter (Signed)
Prescription sent, referral entered.

## 2021-08-18 ENCOUNTER — Other Ambulatory Visit (HOSPITAL_COMMUNITY): Payer: Self-pay

## 2021-08-20 ENCOUNTER — Other Ambulatory Visit (HOSPITAL_COMMUNITY): Payer: Self-pay

## 2021-08-25 ENCOUNTER — Other Ambulatory Visit (HOSPITAL_COMMUNITY): Payer: Self-pay

## 2021-08-27 ENCOUNTER — Inpatient Hospital Stay: Payer: Medicare HMO

## 2021-08-27 ENCOUNTER — Inpatient Hospital Stay: Payer: Medicare HMO | Admitting: Internal Medicine

## 2021-08-28 ENCOUNTER — Other Ambulatory Visit (HOSPITAL_COMMUNITY): Payer: Self-pay

## 2021-08-28 ENCOUNTER — Other Ambulatory Visit: Payer: Self-pay | Admitting: Family Medicine

## 2021-08-28 MED ORDER — TRAMADOL HCL 50 MG PO TABS
ORAL_TABLET | ORAL | 0 refills | Status: DC
  Filled 2021-08-28: qty 90, 30d supply, fill #0

## 2021-08-28 NOTE — Telephone Encounter (Signed)
Requesting:tramadol 50 mg Contract:07/16/20 UDS:07/16/20 Last Visit:04/17/21 Next Visit:10/07/21 Last Refill:07/30/21  Please Advise

## 2021-08-29 ENCOUNTER — Other Ambulatory Visit (HOSPITAL_COMMUNITY): Payer: Self-pay

## 2021-09-01 ENCOUNTER — Other Ambulatory Visit (HOSPITAL_COMMUNITY): Payer: Self-pay

## 2021-09-08 ENCOUNTER — Telehealth: Payer: Self-pay | Admitting: Internal Medicine

## 2021-09-08 NOTE — Telephone Encounter (Signed)
Spoke with patient's wife, [name]. [name] wanted to know if there is less expensive medication than Eliquis. She and patient are both taking it and are having a hard time affording it.  I advised [name] I would make Dr. Harrington Challenger aware of her concerns. The number for BMSPAF was given as well.

## 2021-09-08 NOTE — Telephone Encounter (Signed)
Pt c/o medication issue:  1. Name of Medication:   apixaban (ELIQUIS) 5 MG TABS tablet  2. How are you currently taking this medication (dosage and times per day)?   As prescribed  3. Are you having a reaction (difficulty breathing--STAT)?  No  4. What is your medication issue?   Wife called stating this medication is very expensive and she would like to know if there is an alternate less expensive medication.

## 2021-09-08 NOTE — Telephone Encounter (Signed)
Can we get them samples    Also Need to check on on copay for Xarelto Coumadin but that would require sticks for INR

## 2021-09-16 ENCOUNTER — Other Ambulatory Visit: Payer: Self-pay | Admitting: Internal Medicine

## 2021-09-16 ENCOUNTER — Other Ambulatory Visit (HOSPITAL_COMMUNITY): Payer: Self-pay

## 2021-09-16 NOTE — Telephone Encounter (Signed)
My Chart message sent to the pt... I have left him 2 weeks worth of Eliquis samples... he will get a cost analysis for Xarelto to see if more affordable and will let us know... I also left an Eliquis assistance program form with his samples.

## 2021-09-17 ENCOUNTER — Other Ambulatory Visit (HOSPITAL_COMMUNITY): Payer: Self-pay

## 2021-09-17 NOTE — Telephone Encounter (Addendum)
I spoke with the pts wife and she says she already has the papers for [place] but has not filled them out yest.. I urged her to fill them out and bring to [place] asap so we can send the in for him.... I also spoke with the [facility] Pharmacist and the test claim showed that Xarelto would be more expensive....$145 and they are paying $120 for Eliquis.   He is in the gap with his coverage so he will have to pay no mater which med he is taking.   She declined the option of him taking Coumadin.

## 2021-09-18 ENCOUNTER — Other Ambulatory Visit (HOSPITAL_COMMUNITY): Payer: Self-pay

## 2021-09-18 MED ORDER — MINOXIDIL 10 MG PO TABS
ORAL_TABLET | Freq: Two times a day (BID) | ORAL | 1 refills | Status: DC
  Filled 2021-09-18: qty 180, 90d supply, fill #0
  Filled 2021-12-15: qty 180, 90d supply, fill #1

## 2021-09-18 MED ORDER — CLONIDINE HCL 0.1 MG PO TABS
ORAL_TABLET | Freq: Two times a day (BID) | ORAL | 1 refills | Status: DC
  Filled 2021-09-18: qty 180, 90d supply, fill #0
  Filled 2021-10-06 – 2021-12-23 (×2): qty 180, 90d supply, fill #1

## 2021-09-19 ENCOUNTER — Other Ambulatory Visit (HOSPITAL_COMMUNITY): Payer: Self-pay

## 2021-09-20 ENCOUNTER — Other Ambulatory Visit (HOSPITAL_COMMUNITY): Payer: Self-pay

## 2021-09-25 ENCOUNTER — Other Ambulatory Visit: Payer: Self-pay | Admitting: Family Medicine

## 2021-09-25 ENCOUNTER — Other Ambulatory Visit (HOSPITAL_COMMUNITY): Payer: Self-pay

## 2021-09-25 MED ORDER — TRAMADOL HCL 50 MG PO TABS
ORAL_TABLET | ORAL | 0 refills | Status: DC
  Filled 2021-09-25: qty 90, 30d supply, fill #0

## 2021-09-25 NOTE — Telephone Encounter (Signed)
Requesting: tramadol 50mg   Contract:07/16/20 UDS: 07/16/20 Last Visit: 04/17/21 Next Visit: 10/07/21 Last Refill: 08/28/21 #90 and 0RF  Please Advise

## 2021-09-30 ENCOUNTER — Other Ambulatory Visit (HOSPITAL_COMMUNITY): Payer: Self-pay

## 2021-09-30 ENCOUNTER — Other Ambulatory Visit: Payer: Self-pay | Admitting: Family Medicine

## 2021-09-30 DIAGNOSIS — E559 Vitamin D deficiency, unspecified: Secondary | ICD-10-CM

## 2021-09-30 DIAGNOSIS — I10 Essential (primary) hypertension: Secondary | ICD-10-CM

## 2021-09-30 DIAGNOSIS — I6350 Cerebral infarction due to unspecified occlusion or stenosis of unspecified cerebral artery: Secondary | ICD-10-CM

## 2021-09-30 DIAGNOSIS — E782 Mixed hyperlipidemia: Secondary | ICD-10-CM

## 2021-10-01 ENCOUNTER — Other Ambulatory Visit (HOSPITAL_COMMUNITY): Payer: Self-pay

## 2021-10-01 MED ORDER — ATORVASTATIN CALCIUM 40 MG PO TABS
40.0000 mg | ORAL_TABLET | Freq: Every day | ORAL | 0 refills | Status: DC
  Filled 2021-10-01: qty 90, 90d supply, fill #0

## 2021-10-02 ENCOUNTER — Telehealth: Payer: Self-pay

## 2021-10-02 NOTE — Telephone Encounter (Signed)
Pts wife to drop off the pt assistance forms for the pt and herself for their Eliquis.

## 2021-10-02 NOTE — Telephone Encounter (Signed)
**Note De-Identified  Obfuscation** We are leaving the pt 2 weeks of Eliquis 5 mg samples in the front office for him to pick up while dropping his BMSPAF application off.

## 2021-10-03 NOTE — Telephone Encounter (Signed)
**Note De-Identified  Obfuscation** The pt left his BMSPAF application for Eliquis asst at the office with documents.   I have completed the providers page with DOD, Dr Hassell Done information as Dr Harrington Challenger is currently out of the office. I have also e-mailed all to Dr Alan Ripper nurse so she can obtain Dr Hassell Done signature, date it, and to fax all to [facility] at the fax number written on the cover letter included.

## 2021-10-06 ENCOUNTER — Other Ambulatory Visit (HOSPITAL_COMMUNITY): Payer: Self-pay

## 2021-10-06 NOTE — Telephone Encounter (Signed)
Forms faxed to [employer].

## 2021-10-06 NOTE — Assessment & Plan Note (Signed)
Supplement and monitor 

## 2021-10-06 NOTE — Assessment & Plan Note (Signed)
Tolerating statin, encouraged heart healthy diet, avoid trans fats, minimize simple carbs and saturated fats. Increase exercise as tolerated 

## 2021-10-06 NOTE — Assessment & Plan Note (Signed)
Rate controlled, tolerating Eliquis

## 2021-10-06 NOTE — Assessment & Plan Note (Signed)
Well controlled, no changes to meds. Encouraged heart healthy diet such as the DASH diet and exercise as tolerated.  °

## 2021-10-06 NOTE — Assessment & Plan Note (Addendum)
Patient encouraged to maintain heart healthy diet, regular exercise, adequate sleep. Consider daily probiotics. Take medications as prescribed. Labs ordered and reviewed. RSV (respiratory syncitial virus) vaccine at pharmacy Covid booster  new versionlate September At pharmacy High dose flu shot mid Sept to mid Oct Shingrix is the new shingles shot, 2 shots over 2-6 months, confirm coverage with insurance and document, then can return here for shots with nurse appt or at pharmacy

## 2021-10-06 NOTE — Assessment & Plan Note (Signed)
hgba1c acceptable, minimize simple carbs. Increase exercise as tolerated. Continue current meds 

## 2021-10-07 ENCOUNTER — Encounter: Payer: Self-pay | Admitting: Family Medicine

## 2021-10-07 ENCOUNTER — Ambulatory Visit (INDEPENDENT_AMBULATORY_CARE_PROVIDER_SITE_OTHER): Payer: Medicare HMO | Admitting: Family Medicine

## 2021-10-07 VITALS — BP 115/55 | HR 60 | Temp 97.7°F | Resp 16 | Ht 70.0 in | Wt 140.6 lb

## 2021-10-07 DIAGNOSIS — L989 Disorder of the skin and subcutaneous tissue, unspecified: Secondary | ICD-10-CM

## 2021-10-07 DIAGNOSIS — I10 Essential (primary) hypertension: Secondary | ICD-10-CM | POA: Diagnosis not present

## 2021-10-07 DIAGNOSIS — Z23 Encounter for immunization: Secondary | ICD-10-CM | POA: Diagnosis not present

## 2021-10-07 DIAGNOSIS — Z0000 Encounter for general adult medical examination without abnormal findings: Secondary | ICD-10-CM

## 2021-10-07 DIAGNOSIS — I480 Paroxysmal atrial fibrillation: Secondary | ICD-10-CM

## 2021-10-07 DIAGNOSIS — E1165 Type 2 diabetes mellitus with hyperglycemia: Secondary | ICD-10-CM | POA: Diagnosis not present

## 2021-10-07 DIAGNOSIS — E559 Vitamin D deficiency, unspecified: Secondary | ICD-10-CM | POA: Diagnosis not present

## 2021-10-07 DIAGNOSIS — E782 Mixed hyperlipidemia: Secondary | ICD-10-CM | POA: Diagnosis not present

## 2021-10-07 NOTE — Patient Instructions (Addendum)
Call Dr Elvera Lennox for skin  Need 60-80 ounces of water daily  Can include regular Gatorade  Orgain protein drinks Mix Miralax and Benefiber in Gatorade and drink daily to twice Preventive Care 65 Years and Older, Male Preventive care refers to lifestyle choices and visits with your health care provider that can promote health and wellness. Preventive care visits are also called wellness exams. What can I expect for my preventive care visit? Counseling During your preventive care visit, your health care provider may ask about your: Medical history, including: Past medical problems. Family medical history. History of falls. Current health, including: Emotional well-being. Home life and relationship well-being. Sexual activity. Memory and ability to understand (cognition). Lifestyle, including: Alcohol, nicotine or tobacco, and drug use. Access to firearms. Diet, exercise, and sleep habits. Work and work Statistician. Sunscreen use. Safety issues such as seatbelt and bike helmet use. Physical exam Your health care provider will check your: Height and weight. These may be used to calculate your BMI (body mass index). BMI is a measurement that tells if you are at a healthy weight. Waist circumference. This measures the distance around your waistline. This measurement also tells if you are at a healthy weight and may help predict your risk of certain diseases, such as type 2 diabetes and high blood pressure. Heart rate and blood pressure. Body temperature. Skin for abnormal spots. What immunizations do I need?  Vaccines are usually given at various ages, according to a schedule. Your health care provider will recommend vaccines for you based on your age, medical history, and lifestyle or other factors, such as travel or where you work. What tests do I need? Screening Your health care provider may recommend screening tests for certain conditions. This may include: Lipid and  cholesterol levels. Diabetes screening. This is done by checking your blood sugar (glucose) after you have not eaten for a while (fasting). Hepatitis C test. Hepatitis B test. HIV (human immunodeficiency virus) test. STI (sexually transmitted infection) testing, if you are at risk. Lung cancer screening. Colorectal cancer screening. Prostate cancer screening. Abdominal aortic aneurysm (AAA) screening. You may need this if you are a current or former smoker. Talk with your health care provider about your test results, treatment options, and if necessary, the need for more tests. Follow these instructions at home: Eating and drinking  Eat a diet that includes fresh fruits and vegetables, whole grains, lean protein, and low-fat dairy products. Limit your intake of foods with high amounts of sugar, saturated fats, and salt. Take vitamin and mineral supplements as recommended by your health care provider. Do not drink alcohol if your health care provider tells you not to drink. If you drink alcohol: Limit how much you have to 0-2 drinks a day. Know how much alcohol is in your drink. In the [place] one drink equals one 12 oz bottle of beer (355 mL), one 5 oz glass of wine (148 mL), or one 1 oz glass of hard liquor (44 mL). Lifestyle Brush your teeth every morning and night with fluoride toothpaste. Floss one time each day. Exercise for at least 30 minutes 5 or more days each week. Do not use any products that contain nicotine or tobacco. These products include cigarettes, chewing tobacco, and vaping devices, such as e-cigarettes. If you need help quitting, ask your health care provider. Do not use drugs. If you are sexually active, practice safe sex. Use a condom or other form of protection to prevent STIs. Take aspirin only as told  by your health care provider. Make sure that you understand how much to take and what form to take. Work with your health care provider to find out whether it is safe  and beneficial for you to take aspirin daily. Ask your health care provider if you need to take a cholesterol-lowering medicine (statin). Find healthy ways to manage stress, such as: Meditation, yoga, or listening to music. Journaling. Talking to a trusted person. Spending time with friends and family. Safety Always wear your seat belt while driving or riding in a vehicle. Do not drive: If you have been drinking alcohol. Do not ride with someone who has been drinking. When you are tired or distracted. While texting. If you have been using any mind-altering substances or drugs. Wear a helmet and other protective equipment during sports activities. If you have firearms in your house, make sure you follow all gun safety procedures. Minimize exposure to UV radiation to reduce your risk of skin cancer. What's next? Visit your health care provider once a year for an annual wellness visit. Ask your health care provider how often you should have your eyes and teeth checked. Stay up to date on all vaccines. This information is not intended to replace advice given to you by your health care provider. Make sure you discuss any questions you have with your health care provider. Document Revised: 06/26/2020 Document Reviewed: 06/26/2020 Elsevier Patient Education  [place].

## 2021-10-07 NOTE — Progress Notes (Signed)
Subjective:   By signing my name below, I, [name], attest that this documentation has been prepared under the direction and in the presence of Scott Lukes, MD 10/07/2021.   Patient ID: [name], male    DOB: [birth year], 88 y.o.   MRN: [id number]  No chief complaint on file.  HPI Patient is in today for a comprehensive physical exam and follow up on chronic medical concerns. His wife is also speaking on his behalf.   She denies having any fever, chills, ear pain, headaches, muscle pain, joint pain, new moles, rash, itching, congestion, sinus pain, sore throat, chest pain, palpitations, wheezing, nausea, vomitting, abdominal pain, diarrhea, constipation, blood in stool, dysuria, urgency, frequency and hematuria.  Blood pressure: He is currently takes Amlodipine 5 mg, Clonidine 0.1 mg, and Eliquis to manage his blood pressure.   Dermatology: He is inquiring about possible actinic keratosis on his left cheek and also a small abrasion on his right arm. He will follow up on this with his dermatologist.   Diet: His wife states the he is not eating or hydrating well.  Family history: He reports no recent changes to his family history.  Immunizations: He has been informed about receiving COVID-19, high-dose Flu, and RSV immunizations. He is interested in receiving the high-dose Flu immunization today.   Laxatives: His wife states that he takes 3 stool softners in the morning and 3 at night. He also takes 1 laxative combination in the morning and 2 at night. He reports that he occasionally drinks milk of magnesia mixed with prune juice to assist his bowel movements.   Sleep: He reports that he sleeps for 9-10 hours daily. He suspects that his blood pressure medication is causing him to feel tired.   Swelling: He states that his feet swell at night.   Past Medical History:  Diagnosis Date   Acute bronchitis 06/10/2015   Allergic rhinitis    Anemia    Asthma    Carotid  stenosis    a. s/p Left CEA 2002;  b. Carotid US 3/16:  patent R CEA, L < 40%   Chronic pain syndrome    Left shoulder, back   Diverticulosis    Dyslipidemia    Epigastric pain 04/05/2016   Esophageal stricture    Distal, benign   GERD (gastroesophageal reflux disease)    Hemorrhoids    HTN (hypertension)    Negative renal duplex 07-22-11   Hx of echocardiogram    a. Echo 10/11: mod LVH, EF 55-60%   Pleural effusion 07/17/2020   Prostate cancer ([place]) 2000   Seed XRT   Spondylosis, cervical, with myelopathy 11/26/2015   Stroke ([place]) 8/01   right thalamic - on chronic Plavix/ASA   Past Surgical History:  Procedure Laterality Date   ANTERIOR APPROACH HEMI HIP ARTHROPLASTY Right 03/15/2021   Procedure: ANTERIOR APPROACH HEMI HIP ARTHROPLASTY;  Surgeon: Rod Can, MD;  Location: [facility];  Service: Orthopedics;  Laterality: Right;   APPENDECTOMY  1953   CAROTID ENDARTERECTOMY Right 01/2000   CATARACT EXTRACTION Bilateral    CERVICAL [place] SURGERY  8/07   CHEST TUBE INSERTION Left 11/11/2020   Procedure: INSERTION PLEURAL DRAINAGE CATHETER;  Surgeon: [name], DO;  Location: [facility];  Service: Pulmonary;  Laterality: Left;  Indwelling Pleural Catheter   HIP ARTHROPLASTY Left 10/24/2018   Procedure: ARTHROPLASTY BIPOLAR HIP (HEMIARTHROPLASTY);  Surgeon: Paralee Cancel, MD;  Location: [facility];  Service: Orthopedics;  Laterality: Left;   INSERTION PROSTATE  RADIATION SEED  2000   LUMBAR DISC SURGERY  1990s   NASAL SINUS SURGERY     x 3   REMOVAL OF PLEURAL DRAINAGE CATHETER N/A 12/24/2020   Procedure: MINOR REMOVAL OF PLEURAL DRAINAGE CATHETER;  Surgeon: [name], DO;  Location: [facility];  Service: Cardiopulmonary;  Laterality: N/A;   THORACENTESIS N/A 08/15/2020   Procedure: THORACENTESIS;  Surgeon: [name], DO;  Location: [facility];  Service: Pulmonary;  Laterality: N/A;   Family History  Problem Relation Age of Onset   Stroke Brother    Stroke Sister     Stroke Mother    Hyperlipidemia Mother    Hypertension Mother    Lung disease Father    Diabetes Son    Gout Son    Obesity Sister    Alcohol abuse Brother    Cancer Brother        lung, smoker   Social History   Socioeconomic History   Marital status: Married    Spouse name: Not on file   Number of children: 1   Years of education: 10   Highest education level: Not on file  Occupational History   Occupation: Retired  Tobacco Use   Smoking status: Former    Packs/day: 0.25    Years: 35.00    Total pack years: 8.75    Types: Cigarettes    Start date: 1950    Quit date: 01/12/1978    Years since quitting: 43.7   Smokeless tobacco: Current    Types: Chew   Tobacco comments:    Started smoking at age 61  Vaping Use   Vaping Use: Never used  Substance and Sexual Activity   Alcohol use: No    Comment: Prior alcoholic, sober since 6294   Drug use: No   Sexual activity: Not Currently  Other Topics Concern   Not on file  Social History Narrative   Retired lives at home w/ his wife   1 daughter   Right-handed    Caffeine: drinks a lot of coffee    Former smoker, former alcoholic abstinent for many years   Social Determinants of Radio broadcast assistant Strain: Low Risk  (05/15/2021)   Overall Financial Resource Strain (CARDIA)    Difficulty of Paying Living Expenses: Not hard at all  Food Insecurity: No Food Insecurity (05/15/2021)   Hunger Vital Sign    Worried About Running Out of Food in the Last Year: Never true    [place] in the Last Year: Never true  Transportation Needs: No Transportation Needs (05/15/2021)   PRAPARE - Hydrologist (Medical): No    Lack of Transportation (Non-Medical): No  Physical Activity: Insufficiently Active (05/15/2021)   Exercise Vital Sign    Days of Exercise per Week: 1 day    Minutes of Exercise per Session: 50 min  Stress: No Stress Concern Present (05/15/2021)   [place]    Feeling of Stress : Not at all  Social Connections: [place] (05/15/2021)   Social Connection and Isolation Panel [NHANES]    Frequency of Communication with Friends and Family: More than three times a week    Frequency of Social Gatherings with Friends and Family: More than three times a week    Attends Religious Services: More than 4 times per year    Active Member of [employer] or Organizations: Yes    Attends [employer]  or Organization Meetings: More than 4 times per year    Marital Status: Married  Human resources officer Violence: Not At Risk (05/15/2021)   Humiliation, Afraid, Rape, and Kick questionnaire    Fear of Current or Ex-Partner: No    Emotionally Abused: No    Physically Abused: No    Sexually Abused: No   Outpatient Medications Prior to Visit  Medication Sig Dispense Refill   acetaminophen (TYLENOL) 500 MG tablet Take 1,000 mg by mouth every 6 (six) hours as needed for mild pain or headache.     albuterol (VENTOLIN HFA) 108 (90 Base) MCG/ACT inhaler INHALE 2 PUFFS BY MOUTH INTO THE LUNGS EVERY 6 HOURS AS NEEDED 18 g 1   amLODipine (NORVASC) 5 MG tablet TAKE 2 TABLETS BY MOUTH DAILY 180 tablet 1   apixaban (ELIQUIS) 5 MG TABS tablet TAKE 1 TABLET BY MOUTH 2 TIMES DAILY 60 tablet 5   atorvastatin (LIPITOR) 40 MG tablet Take 1 tablet (40 mg total) by mouth daily. 90 tablet 0   Cholecalciferol (VITAMIN D3 PO) Take 1 tablet by mouth in the morning. Unsure of how many units     cloNIDine (CATAPRES) 0.1 MG tablet TAKE 1 TABLET BY MOUTH 2 TIMES DAILY 180 tablet 1   docusate sodium (COLACE) 100 MG capsule Take 300 mg by mouth 2 (two) times daily.     Ferrous Fumarate (HEMOCYTE - 106 MG FE) 324 (106 Fe) MG TABS tablet Take 1 tablet (106 mg of iron total) by mouth 2 times daily. 30 tablet 2   ferrous sulfate (FERROUSUL) 325 (65 FE) MG tablet Take 1 tablet (325 mg total) by mouth 3 (three) times daily with meals for 14 days. (Patient  taking differently: Take 325 mg by mouth in the morning and at bedtime.) 90 tablet 2   gabapentin (NEURONTIN) 300 MG capsule TAKE 1 CAPSULE BY MOUTH 3 TIMES DAILY (Patient taking differently: Take 300 mg by mouth at bedtime.) 270 capsule 1   lisinopril (ZESTRIL) 2.5 MG tablet TAKE 1 TABLET BY MOUTH DAILY. 90 tablet 0   metoCLOPramide (REGLAN) 5 MG tablet TAKE 1 TABLET BY MOUTH AT BEDTIME. 30 tablet 5   minoxidil (LONITEN) 10 MG tablet TAKE 1 TABLET BY MOUTH TWICE DAILY 180 tablet 1   ondansetron (ZOFRAN ODT) 8 MG disintegrating tablet Dissolve 1 tablet by mouth every 8 hours as needed for nausea or vomiting. 20 tablet 0   pantoprazole (PROTONIX) 40 MG tablet TAKE 1 TABLET BY MOUTH ONCE A DAY BEFORE BREAKFAST 90 tablet 3   polyvinyl alcohol (LIQUIFILM TEARS) 1.4 % ophthalmic solution Place 1 drop into both eyes 5 (five) times daily as needed for dry eyes.     sennosides-docusate sodium (SENOKOT-S) 8.6-50 MG tablet Take 1-2 tablets by mouth See admin instructions. Take 1 tablet in the morning & take 2 tablet at nigh     traMADol (ULTRAM) 50 MG tablet TAKE 1 TABLET BY MOUTH 3 TIMES DAILY AS NEEDED FOR MODERATE PAIN 90 tablet 0   triamcinolone cream (KENALOG) 0.5 % Apply 1 application topically daily as needed (for skin irritation).      No facility-administered medications prior to visit.   Allergies  Allergen Reactions   Hctz [Hydrochlorothiazide] Shortness Of Breath, Swelling and Other (See Comments)    "Swelling and dyspnea"    Hydralazine Shortness Of Breath and Other (See Comments)    Chest pain and GI issues also   Diovan [Valsartan] Other (See Comments)    Elevated potassium- Hyperkalemia  Metformin And Related Other (See Comments)    Reaction not recalled   Nsaids Other (See Comments)    Other than Tylenol, he isn't to have these because he's on Eliquis   Other Other (See Comments)    Unnamed topically-applied B/P patch = Caused redness   Prednisone Other (See Comments)     Suicidal thoughts   Spironolactone Swelling    Site of swelling not recalled   Codeine Itching and Nausea Only   Oxycodone-Acetaminophen Itching and Nausea Only   Review of Systems  Constitutional:  Negative for chills and fever.  HENT:  Negative for congestion, ear pain, sinus pain and sore throat.   Respiratory:  Negative for cough, shortness of breath and wheezing.   Cardiovascular:  Negative for chest pain and palpitations.  Gastrointestinal:  Negative for abdominal pain, blood in stool, constipation, diarrhea, nausea and vomiting.  Genitourinary:  Negative for dysuria, frequency, hematuria and urgency.  Musculoskeletal:  Negative for joint pain and myalgias.  Skin:  Negative for itching and rash.       (-) New moles.  Neurological:  Negative for headaches.      Objective:    Physical Exam Constitutional:      General: He is not in acute distress.    Appearance: Normal appearance. He is not ill-appearing.  HENT:     Head: Normocephalic and atraumatic.     Right Ear: Tympanic membrane, ear canal and external ear normal.     Left Ear: Tympanic membrane, ear canal and external ear normal.     Mouth/Throat:     Mouth: Mucous membranes are moist.     Pharynx: Oropharynx is clear.  Eyes:     Extraocular Movements: Extraocular movements intact.     Right eye: No nystagmus.     Left eye: No nystagmus.     Pupils: Pupils are equal, round, and reactive to light.  Neck:     Vascular: No carotid bruit.  Cardiovascular:     Rate and Rhythm: Normal rate and regular rhythm.     Pulses: Normal pulses.     Heart sounds: Normal heart sounds. No murmur heard.    No gallop.  Pulmonary:     Effort: Pulmonary effort is normal. No respiratory distress.     Breath sounds: Normal breath sounds. No wheezing or rales.  Abdominal:     General: Bowel sounds are normal.     Tenderness: There is no abdominal tenderness.  Musculoskeletal:     Comments: Muscle strength 5/5 on upper and lower  extremities.   Lymphadenopathy:     Cervical: No cervical adenopathy.  Skin:    General: Skin is warm and dry.  Neurological:     Mental Status: He is alert and oriented to person, place, and time.     Sensory: Sensation is intact.     Motor: Motor function is intact.     Coordination: Coordination is intact.     Deep Tendon Reflexes:     Reflex Scores:      Patellar reflexes are 2+ on the right side and 2+ on the left side. Psychiatric:        Mood and Affect: Mood normal.        Behavior: Behavior normal.        Judgment: Judgment normal.    There were no vitals taken for this visit. Wt Readings from Last 3 Encounters:  07/01/21 136 lb (61.7 kg)  05/15/21 144 lb (65.3 kg)  04/17/21 144  lb (65.3 kg)   Diabetic Foot Exam - Simple   No data filed    Lab Results  Component Value Date   WBC 6.7 07/01/2021   HGB 11.7 (L) 07/01/2021   HCT 35.5 (L) 07/01/2021   PLT 304 07/01/2021   GLUCOSE 147 (H) 07/18/2021   CHOL 110 04/17/2021   TRIG 88.0 04/17/2021   HDL 43.00 04/17/2021   LDLCALC 50 04/17/2021   ALT 5 07/01/2021   AST 10 07/01/2021   NA 140 07/18/2021   K 4.6 07/18/2021   CL 107 (H) 07/18/2021   CREATININE 1.13 07/18/2021   BUN 21 07/18/2021   CO2 21 07/18/2021   TSH 2.450 07/01/2021   PSA 0.00 (L) 04/17/2021   INR 1.3 (H) 03/15/2021   HGBA1C 5.8 04/17/2021   Lab Results  Component Value Date   TSH 2.450 07/01/2021   Lab Results  Component Value Date   WBC 6.7 07/01/2021   HGB 11.7 (L) 07/01/2021   HCT 35.5 (L) 07/01/2021   MCV 90 07/01/2021   PLT 304 07/01/2021   Lab Results  Component Value Date   NA 140 07/18/2021   K 4.6 07/18/2021   CO2 21 07/18/2021   GLUCOSE 147 (H) 07/18/2021   BUN 21 07/18/2021   CREATININE 1.13 07/18/2021   BILITOT <0.2 07/01/2021   ALKPHOS 61 07/01/2021   AST 10 07/01/2021   ALT 5 07/01/2021   PROT 6.0 07/01/2021   ALBUMIN 3.9 07/01/2021   CALCIUM 8.6 07/18/2021   ANIONGAP 7 03/20/2021   EGFR 62 07/18/2021    GFR 60.78 04/17/2021   Lab Results  Component Value Date   CHOL 110 04/17/2021   Lab Results  Component Value Date   HDL 43.00 04/17/2021   Lab Results  Component Value Date   LDLCALC 50 04/17/2021   Lab Results  Component Value Date   TRIG 88.0 04/17/2021   Lab Results  Component Value Date   CHOLHDL 3 04/17/2021   Lab Results  Component Value Date   HGBA1C 5.8 04/17/2021   Colonoscopy: Last completed on 05/08/2010.   PSA: Last completed on 04/17/2021. 0.00 ng/mL.    Assessment & Plan:   Problem List Items Addressed This Visit       Cardiovascular and Mediastinum   Essential hypertension   PAF (paroxysmal atrial fibrillation) ([place])     Endocrine   Controlled type 2 diabetes mellitus with hyperglycemia, without long-term current use of insulin (HCC)     Other   Vitamin D deficiency   HLD (hyperlipidemia)   Preventative health care   No orders of the defined types were placed in this encounter.  I, [name], personally preformed the services described in this documentation.  All medical record entries made by the scribe were at my direction and in my presence.  I have reviewed the chart and discharge instructions (if applicable) and agree that the record reflects my personal performance and is accurate and complete. 10/07/2021  I,Mohammed Iqbal,acting as a scribe for Penni Homans, MD.,have documented all relevant documentation on the behalf of Penni Homans, MD,as directed by  Penni Homans, MD while in the presence of Penni Homans, MD.  [name]

## 2021-10-08 DIAGNOSIS — L989 Disorder of the skin and subcutaneous tissue, unspecified: Secondary | ICD-10-CM | POA: Insufficient documentation

## 2021-10-08 LAB — CBC
HCT: 33.4 % — ABNORMAL LOW (ref 39.0–52.0)
Hemoglobin: 11.4 g/dL — ABNORMAL LOW (ref 13.0–17.0)
MCHC: 34 g/dL (ref 30.0–36.0)
MCV: 91.8 fl (ref 78.0–100.0)
Platelets: 269 10*3/uL (ref 150.0–400.0)
RBC: 3.64 Mil/uL — ABNORMAL LOW (ref 4.22–5.81)
RDW: 15.1 % (ref 11.5–15.5)
WBC: 5.5 10*3/uL (ref 4.0–10.5)

## 2021-10-08 LAB — COMPREHENSIVE METABOLIC PANEL
ALT: 4 U/L (ref 0–53)
AST: 10 U/L (ref 0–37)
Albumin: 3.9 g/dL (ref 3.5–5.2)
Alkaline Phosphatase: 52 U/L (ref 39–117)
BUN: 24 mg/dL — ABNORMAL HIGH (ref 6–23)
CO2: 27 mEq/L (ref 19–32)
Calcium: 8.8 mg/dL (ref 8.4–10.5)
Chloride: 105 mEq/L (ref 96–112)
Creatinine, Ser: 1.15 mg/dL (ref 0.40–1.50)
GFR: 56.18 mL/min — ABNORMAL LOW (ref >60.00)
Glucose, Bld: 94 mg/dL (ref 70–99)
Potassium: 5 mEq/L (ref 3.5–5.1)
Sodium: 137 mEq/L (ref 135–145)
Total Bilirubin: 0.4 mg/dL (ref 0.2–1.2)
Total Protein: 6.4 g/dL (ref 6.0–8.3)

## 2021-10-08 LAB — LIPID PANEL
Cholesterol: 120 mg/dL (ref 0–200)
HDL: 50.3 mg/dL (ref >39.00)
LDL Cholesterol: 53 mg/dL (ref 0–99)
NonHDL: 69.88
Total CHOL/HDL Ratio: 2
Triglycerides: 86 mg/dL (ref 0.0–149.0)
VLDL: 17.2 mg/dL (ref 0.0–40.0)

## 2021-10-08 LAB — TSH: TSH: 2.58 u[IU]/mL (ref 0.35–5.50)

## 2021-10-08 LAB — HEMOGLOBIN A1C: Hgb A1c MFr Bld: 5.5 % (ref 4.6–6.5)

## 2021-10-08 NOTE — Assessment & Plan Note (Signed)
Has an abrasion on right arm which is healing well and some scaly flesh colored lesions on left cheek he is encouraged to follow up with dermatology

## 2021-10-13 ENCOUNTER — Other Ambulatory Visit (HOSPITAL_COMMUNITY): Payer: Self-pay

## 2021-10-13 ENCOUNTER — Other Ambulatory Visit: Payer: Self-pay | Admitting: Internal Medicine

## 2021-10-13 ENCOUNTER — Telehealth: Payer: Self-pay | Admitting: Internal Medicine

## 2021-10-13 ENCOUNTER — Other Ambulatory Visit: Payer: Self-pay | Admitting: Family Medicine

## 2021-10-13 MED ORDER — AMLODIPINE BESYLATE 5 MG PO TABS
10.0000 mg | ORAL_TABLET | Freq: Every day | ORAL | 1 refills | Status: DC
  Filled 2021-10-13: qty 180, 90d supply, fill #0
  Filled 2022-01-19: qty 180, 90d supply, fill #1

## 2021-10-13 NOTE — Telephone Encounter (Signed)
Pt c/o medication issue:  1. Name of Medication: cloNIDine (CATAPRES) 0.1 MG tablet  2. How are you currently taking this medication (dosage and times per day)? 1 tablet in the morning and 1 in the evening  3. Are you having a reaction (difficulty breathing--STAT)? no  4. What is your medication issue? Patient's wife states the medication is making him too sleepy. Please advise. Phone: [phone]

## 2021-10-14 ENCOUNTER — Other Ambulatory Visit (HOSPITAL_COMMUNITY): Payer: Self-pay

## 2021-10-14 MED ORDER — LISINOPRIL 2.5 MG PO TABS
2.5000 mg | ORAL_TABLET | Freq: Every day | ORAL | 0 refills | Status: DC
  Filled 2021-10-14: qty 90, 90d supply, fill #0

## 2021-10-14 NOTE — Telephone Encounter (Signed)
I spoke with the pts wife and she reports that the opt has been very sleepy in the afternoon after taking his morning meds.... his BP has been low but she did not have nay readings to give me but see if she can recall them... at his PCP appt 10/07/21 his BP was 115/55.... he has not been dizzy or Sob....just very sleepy.  She will try to recall his BP readings on their cuff and call me with the readings but will record going forward.   Will forward to Dr Harrington Challenger to see if any med changes warranted at this time.

## 2021-10-14 NOTE — Telephone Encounter (Signed)
**Note De-Identified [name] Obfuscation** Letter received [name] fax from [facility] stating that they have approved the pt for Eliquis assistance until 01/11/2022. [id number]  The letter states that they have notified the pt of this approval as well.

## 2021-10-15 DIAGNOSIS — L821 Other seborrheic keratosis: Secondary | ICD-10-CM | POA: Diagnosis not present

## 2021-10-15 DIAGNOSIS — C44629 Squamous cell carcinoma of skin of left upper limb, including shoulder: Secondary | ICD-10-CM | POA: Diagnosis not present

## 2021-10-15 DIAGNOSIS — C44612 Basal cell carcinoma of skin of right upper limb, including shoulder: Secondary | ICD-10-CM | POA: Diagnosis not present

## 2021-10-15 DIAGNOSIS — D692 Other nonthrombocytopenic purpura: Secondary | ICD-10-CM | POA: Diagnosis not present

## 2021-10-15 DIAGNOSIS — L570 Actinic keratosis: Secondary | ICD-10-CM | POA: Diagnosis not present

## 2021-10-15 DIAGNOSIS — C44519 Basal cell carcinoma of skin of other part of trunk: Secondary | ICD-10-CM | POA: Diagnosis not present

## 2021-10-15 DIAGNOSIS — Z85828 Personal history of other malignant neoplasm of skin: Secondary | ICD-10-CM | POA: Diagnosis not present

## 2021-10-15 DIAGNOSIS — D225 Melanocytic nevi of trunk: Secondary | ICD-10-CM | POA: Diagnosis not present

## 2021-10-15 DIAGNOSIS — L814 Other melanin hyperpigmentation: Secondary | ICD-10-CM | POA: Diagnosis not present

## 2021-10-16 ENCOUNTER — Other Ambulatory Visit (HOSPITAL_COMMUNITY): Payer: Self-pay

## 2021-10-16 MED ORDER — TRIAMCINOLONE ACETONIDE 0.1 % EX CREA
1.0000 | TOPICAL_CREAM | Freq: Every day | CUTANEOUS | 1 refills | Status: AC | PRN
  Filled 2021-10-16: qty 454, 30d supply, fill #0

## 2021-10-19 ENCOUNTER — Emergency Department (HOSPITAL_COMMUNITY)
Admission: EM | Admit: 2021-10-19 | Discharge: 2021-10-19 | Disposition: A | Discharge status: Home / Self Care | Payer: Medicare HMO | Attending: Emergency Medicine | Admitting: Emergency Medicine

## 2021-10-19 ENCOUNTER — Emergency Department (HOSPITAL_COMMUNITY): Payer: Medicare HMO

## 2021-10-19 ENCOUNTER — Other Ambulatory Visit: Payer: Self-pay

## 2021-10-19 DIAGNOSIS — W19XXXA Unspecified fall, initial encounter: Secondary | ICD-10-CM

## 2021-10-19 DIAGNOSIS — W010XXA Fall on same level from slipping, tripping and stumbling without subsequent striking against object, initial encounter: Secondary | ICD-10-CM | POA: Insufficient documentation

## 2021-10-19 DIAGNOSIS — J90 Pleural effusion, not elsewhere classified: Secondary | ICD-10-CM | POA: Diagnosis not present

## 2021-10-19 DIAGNOSIS — M542 Cervicalgia: Secondary | ICD-10-CM | POA: Diagnosis not present

## 2021-10-19 DIAGNOSIS — Z043 Encounter for examination and observation following other accident: Secondary | ICD-10-CM | POA: Diagnosis not present

## 2021-10-19 DIAGNOSIS — R519 Headache, unspecified: Secondary | ICD-10-CM | POA: Insufficient documentation

## 2021-10-19 DIAGNOSIS — I6381 Other cerebral infarction due to occlusion or stenosis of small artery: Secondary | ICD-10-CM | POA: Diagnosis not present

## 2021-10-19 DIAGNOSIS — Z7901 Long term (current) use of anticoagulants: Secondary | ICD-10-CM | POA: Diagnosis not present

## 2021-10-19 DIAGNOSIS — S199XXA Unspecified injury of neck, initial encounter: Secondary | ICD-10-CM | POA: Diagnosis not present

## 2021-10-19 DIAGNOSIS — Z79899 Other long term (current) drug therapy: Secondary | ICD-10-CM | POA: Insufficient documentation

## 2021-10-19 LAB — I-STAT CHEM 8, ED
BUN: 27 mg/dL — ABNORMAL HIGH (ref 8–23)
Calcium, Ion: 1.18 mmol/L (ref 1.15–1.40)
Chloride: 104 mmol/L (ref 98–111)
Creatinine, Ser: 1.2 mg/dL (ref 0.61–1.24)
Glucose, Bld: 147 mg/dL — ABNORMAL HIGH (ref 70–99)
HCT: 32 % — ABNORMAL LOW (ref 39.0–52.0)
Hemoglobin: 10.9 g/dL — ABNORMAL LOW (ref 13.0–17.0)
Potassium: 4.4 mmol/L (ref 3.5–5.1)
Sodium: 138 mmol/L (ref 135–145)
TCO2: 24 mmol/L (ref 22–32)

## 2021-10-19 LAB — COMPREHENSIVE METABOLIC PANEL
ALT: 7 U/L (ref 0–44)
AST: 15 U/L (ref 15–41)
Albumin: 3.5 g/dL (ref 3.5–5.0)
Alkaline Phosphatase: 58 U/L (ref 38–126)
Anion gap: 7 (ref 5–15)
BUN: 25 mg/dL — ABNORMAL HIGH (ref 8–23)
CO2: 25 mmol/L (ref 22–32)
Calcium: 8 mg/dL — ABNORMAL LOW (ref 8.9–10.3)
Chloride: 106 mmol/L (ref 98–111)
Creatinine, Ser: 1.24 mg/dL (ref 0.61–1.24)
GFR, Estimated: 55 mL/min — ABNORMAL LOW (ref >60)
Glucose, Bld: 153 mg/dL — ABNORMAL HIGH (ref 70–99)
Potassium: 4.4 mmol/L (ref 3.5–5.1)
Sodium: 138 mmol/L (ref 135–145)
Total Bilirubin: 0.3 mg/dL (ref 0.3–1.2)
Total Protein: 6.1 g/dL — ABNORMAL LOW (ref 6.5–8.1)

## 2021-10-19 LAB — SAMPLE TO BLOOD BANK

## 2021-10-19 LAB — PROTIME-INR
INR: 1.4 — ABNORMAL HIGH (ref 0.8–1.2)
Prothrombin Time: 16.7 seconds — ABNORMAL HIGH (ref 11.4–15.2)

## 2021-10-19 LAB — CBC
HCT: 31 % — ABNORMAL LOW (ref 39.0–52.0)
Hemoglobin: 10.6 g/dL — ABNORMAL LOW (ref 13.0–17.0)
MCH: 31.3 pg (ref 26.0–34.0)
MCHC: 34.2 g/dL (ref 30.0–36.0)
MCV: 91.4 fL (ref 80.0–100.0)
Platelets: 257 10*3/uL (ref 150–400)
RBC: 3.39 MIL/uL — ABNORMAL LOW (ref 4.22–5.81)
RDW: 14.1 % (ref 11.5–15.5)
WBC: 7.8 10*3/uL (ref 4.0–10.5)
nRBC: 0 % (ref 0.0–0.2)

## 2021-10-19 MED ORDER — FENTANYL CITRATE PF 50 MCG/ML IJ SOSY
50.0000 ug | PREFILLED_SYRINGE | Freq: Once | INTRAMUSCULAR | Status: AC
  Administered 2021-10-19: 50 ug via INTRAVENOUS
  Filled 2021-10-19: qty 1

## 2021-10-19 NOTE — Discharge Instructions (Addendum)
Please return to the emergency department if you develop new or worsening symptoms, difficulty walking, headaches, neck pain, loss consciousness or any other concerning symptoms.  Please follow-up with your primary care doctor as needed if you have persistent muscle soreness.

## 2021-10-19 NOTE — ED Provider Notes (Signed)
[facility] EMERGENCY DEPARTMENT Provider Note   CSN: [id number] Arrival date & time: 10/19/21  1544     History  Chief Complaint  Patient presents with   level 2 fall    [name] is a 88 y.o. male.   Patient is presenting to the ED after a GLF. Patient was playing with his 83 yo grandson whenever he tripped and fell in the yard.  Patient hit his head during the fall and was complaining of neck pain.  Patient states that he has not had any chest pain, shortness breath, abdominal pain, hip pain, leg pain, numbness or tingling in his arms or legs, weakness in the extremities, sensation deficits since the fall.  Patient is doing generally well.  Patient is brought to the emergency department via POV.  Patient had no loss of consciousness, nausea, vomiting since the incident.    Home Medications Prior to Admission medications   Medication Sig Start Date End Date Taking? Authorizing Provider  acetaminophen (TYLENOL) 500 MG tablet Take 1,000 mg by mouth every 6 (six) hours as needed for mild pain or headache.    [provider]  albuterol (VENTOLIN HFA) 108 (90 Base) MCG/ACT inhaler INHALE 2 PUFFS BY MOUTH INTO THE LUNGS EVERY 6 HOURS AS NEEDED 04/22/21 04/22/22  [name], MD  amLODipine (NORVASC) 5 MG tablet Take 2 tablets (10 mg total) by mouth daily (need office visit) 10/13/21   Fay Records, MD  apixaban (ELIQUIS) 5 MG TABS tablet TAKE 1 TABLET BY MOUTH 2 TIMES DAILY 05/26/21 05/26/22  Fay Records, MD  atorvastatin (LIPITOR) 40 MG tablet Take 1 tablet (40 mg total) by mouth daily. 10/01/21   [name], MD  Cholecalciferol (VITAMIN D3 PO) Take 1 tablet by mouth in the morning. Unsure of how many units    [provider]  cloNIDine (CATAPRES) 0.1 MG tablet TAKE 1 TABLET BY MOUTH 2 TIMES DAILY 09/18/21   Fay Records, MD  docusate sodium (COLACE) 100 MG capsule Take 300 mg by mouth 2 (two) times daily.    [provider]  Ferrous  Fumarate (HEMOCYTE - 106 MG FE) 324 (106 Fe) MG TABS tablet Take 1 tablet (106 mg of iron total) by mouth 2 times daily. 08/07/21   [name], MD  gabapentin (NEURONTIN) 300 MG capsule TAKE 1 CAPSULE BY MOUTH 3 TIMES DAILY Patient taking differently: Take 300 mg by mouth at bedtime. 12/23/20 12/23/21  [name], MD  lisinopril (ZESTRIL) 2.5 MG tablet Take 1 tablet (2.5 mg total) by mouth daily. 10/14/21   [name], MD  metoCLOPramide (REGLAN) 5 MG tablet TAKE 1 TABLET BY MOUTH AT BEDTIME. 06/30/21 06/30/22  Gatha Mayer, MD  minoxidil (LONITEN) 10 MG tablet TAKE 1 TABLET BY MOUTH TWICE DAILY 09/18/21 09/18/22  Fay Records, MD  ondansetron (ZOFRAN ODT) 8 MG disintegrating tablet Dissolve 1 tablet by mouth every 8 hours as needed for nausea or vomiting. 08/23/20   [name], MD  pantoprazole (PROTONIX) 40 MG tablet TAKE 1 TABLET BY MOUTH ONCE A DAY BEFORE BREAKFAST 03/18/20 07/01/21  Gatha Mayer, MD  polyvinyl alcohol (LIQUIFILM TEARS) 1.4 % ophthalmic solution Place 1 drop into both eyes 5 (five) times daily as needed for dry eyes.    [provider]  sennosides-docusate sodium (SENOKOT-S) 8.6-50 MG tablet Take 1-2 tablets by mouth See admin instructions. Take 1 tablet in the morning & take 2 tablet  at nigh    [provider]  traMADol (ULTRAM) 50 MG tablet TAKE 1 TABLET BY MOUTH 3 TIMES DAILY AS NEEDED FOR MODERATE PAIN 09/25/21 03/24/22  [name], MD  triamcinolone cream (KENALOG) 0.1 % Apply topically to affected areas of the skin daily as needed. 10/16/21     triamcinolone cream (KENALOG) 0.5 % Apply 1 application topically daily as needed (for skin irritation).  01/22/12   Janith Lima, MD      Allergies    Hctz [hydrochlorothiazide], Hydralazine, Diovan [valsartan], Metformin and related, Nsaids, Other, Prednisone, Spironolactone, Codeine, and Oxycodone-acetaminophen    Review of Systems   Review of Systems  Physical Exam Updated Vital  Signs BP (!) 168/52 (BP Location: Right Arm)   Pulse 63   Temp 98.4 F (36.9 C) (Oral)   Resp 16   Ht 5\' 11"  (1.803 m)   Wt 63.5 kg   SpO2 97%   BMI 19.53 kg/m  Physical Exam Vitals and nursing note reviewed.  Constitutional:      General: He is not in acute distress.    Appearance: He is well-developed.  HENT:     Head: Normocephalic and atraumatic.  Eyes:     Conjunctiva/sclera: Conjunctivae normal.  Cardiovascular:     Rate and Rhythm: Normal rate and regular rhythm.     Heart sounds: No murmur heard. Pulmonary:     Effort: Pulmonary effort is normal. No respiratory distress.     Breath sounds: Normal breath sounds.  Abdominal:     Palpations: Abdomen is soft.     Tenderness: There is no abdominal tenderness.  Musculoskeletal:        General: No swelling.     Cervical back: Normal range of motion and neck supple. No tenderness.  Skin:    General: Skin is warm and dry.     Capillary Refill: Capillary refill takes less than 2 seconds.  Neurological:     General: No focal deficit present.     Mental Status: He is alert and oriented to person, place, and time. Mental status is at baseline.     Sensory: No sensory deficit.     Motor: No weakness.  Psychiatric:        Mood and Affect: Mood normal.     ED Results / Procedures / Treatments   Labs (all labs ordered are listed, but only abnormal results are displayed) Labs Reviewed  COMPREHENSIVE METABOLIC PANEL - Abnormal; Notable for the following components:      Result Value   Glucose, Bld 153 (*)    BUN 25 (*)    Calcium 8.0 (*)    Total Protein 6.1 (*)    GFR, Estimated 55 (*)    All other components within normal limits  CBC - Abnormal; Notable for the following components:   RBC 3.39 (*)    Hemoglobin 10.6 (*)    HCT 31.0 (*)    All other components within normal limits  PROTIME-INR - Abnormal; Notable for the following components:   Prothrombin Time 16.7 (*)    INR 1.4 (*)    All other components  within normal limits  I-STAT CHEM 8, ED - Abnormal; Notable for the following components:   BUN 27 (*)    Glucose, Bld 147 (*)    Hemoglobin 10.9 (*)    HCT 32.0 (*)    All other components within normal limits  URINALYSIS, ROUTINE W REFLEX MICROSCOPIC  SAMPLE TO BLOOD BANK  EKG None  Radiology CT HEAD WO CONTRAST (5MM)  Result Date: 10/19/2021 CLINICAL DATA:  [place] striking back of head and neck, no loss of consciousness, head trauma, takes Eliquis EXAM: CT HEAD WITHOUT CONTRAST CT CERVICAL SPINE WITHOUT CONTRAST TECHNIQUE: Multidetector CT imaging of the head and cervical spine was performed following the standard protocol without intravenous contrast. Multiplanar CT image reconstructions of the cervical spine were also generated. RADIATION DOSE REDUCTION: This exam was performed according to the departmental dose-optimization program which includes automated exposure control, adjustment of the mA and/or kV according to patient size and/or use of iterative reconstruction technique. COMPARISON:  CT head and cervical spine 03/14/2021 FINDINGS: CT HEAD FINDINGS Brain: Generalized atrophy. Normal ventricular morphology. No midline shift or mass effect. Small vessel chronic ischemic changes of deep cerebral white matter. Old lacunar infarcts LEFT cerebellum and RIGHT basal ganglia. Small old RIGHT occipital infarct. No intracranial hemorrhage, mass lesion, or evidence of acute infarction. No extra-axial fluid collections. Vascular: No hyperdense vessels. Atherosclerotic calcification of internal carotid and vertebral arteries at skull base Skull: Intact Sinuses/Orbits: Clear Other: N/A CT CERVICAL SPINE FINDINGS Alignment: Normal Skull base and vertebrae: Osseous demineralization. Prior anterior fusion C4-C6. Multilevel disc space narrowing and scattered endplate spurs. Multilevel facet degenerative changes. Skull base intact. No definite fracture, subluxation, or bone destruction. Soft tissues and  spinal canal: Prevertebral soft tissues normal thickness. Atherosclerotic calcifications carotid bifurcations. 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 Old infarcts LEFT cerebellum, RIGHT basal ganglia, and RIGHT occipital lobe. No acute intracranial abnormalities. Multilevel degenerative disc and facet disease changes of the cervical spine with prior anterior fusion C4-C6. No acute cervical spine abnormalities. Electronically Signed   By: Lavonia Dana M.D.   On: 10/19/2021 16:59   CT Cervical Spine Wo Contrast  Result Date: 10/19/2021 CLINICAL DATA:  [place] striking back of head and neck, no loss of consciousness, head trauma, takes Eliquis EXAM: CT HEAD WITHOUT CONTRAST CT CERVICAL SPINE WITHOUT CONTRAST TECHNIQUE: Multidetector CT imaging of the head and cervical spine was performed following the standard protocol without intravenous contrast. Multiplanar CT image reconstructions of the cervical spine were also generated. RADIATION DOSE REDUCTION: This exam was performed according to the departmental dose-optimization program which includes automated exposure control, adjustment of the mA and/or kV according to patient size and/or use of iterative reconstruction technique. COMPARISON:  CT head and cervical spine 03/14/2021 FINDINGS: CT HEAD FINDINGS Brain: Generalized atrophy. Normal ventricular morphology. No midline shift or mass effect. Small vessel chronic ischemic changes of deep cerebral white matter. Old lacunar infarcts LEFT cerebellum and RIGHT basal ganglia. Small old RIGHT occipital infarct. No intracranial hemorrhage, mass lesion, or evidence of acute infarction. No extra-axial fluid collections. Vascular: No hyperdense  vessels. Atherosclerotic calcification of internal carotid and vertebral arteries at skull base Skull: Intact Sinuses/Orbits: Clear Other: N/A CT CERVICAL SPINE FINDINGS Alignment: Normal Skull base and vertebrae: Osseous demineralization. Prior anterior fusion C4-C6. Multilevel disc space narrowing and scattered endplate spurs. Multilevel facet degenerative changes. Skull base intact. No definite fracture, subluxation, or bone destruction. Soft tissues and spinal canal: Prevertebral soft tissues normal thickness. Atherosclerotic calcifications carotid bifurcations. 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 Old infarcts LEFT cerebellum, RIGHT basal ganglia, and RIGHT occipital lobe. No acute intracranial abnormalities. Multilevel degenerative disc and facet disease changes of the cervical spine with prior anterior fusion C4-C6. No acute cervical spine abnormalities. Electronically Signed   By: Lavonia Dana M.D.   On: 10/19/2021 16:59   DG Pelvis Portable  Result Date: 10/19/2021 CLINICAL DATA:  Fall. EXAM: PORTABLE PELVIS 1-2 VIEWS COMPARISON:  03/15/2021. FINDINGS: No fracture.  No bone lesion. Bilateral hip hemi arthroplasties, well seated and aligned and unchanged. Therapy seeds project over the symphysis pubis, also stable. SI joints and symphysis pubis are normally spaced and aligned. Stable degenerative changes of the visualized lower lumbar spine. Soft tissues are unremarkable. IMPRESSION: 1. No fracture or acute finding. Electronically Signed   By: Lajean Manes M.D.   On: 10/19/2021 16:32   DG Chest Portable 1 View  Result Date: 10/19/2021 CLINICAL DATA:  Fall.  Patient takes Eliquis. EXAM: PORTABLE CHEST 1 VIEW COMPARISON:   03/18/2021. FINDINGS: Cardiac silhouette normal in size.  No mediastinal or hilar masses. Minor linear scarring or atelectasis at the left lung base associated with a small effusion. Remainder of the lungs is clear. No pneumothorax. Skeletal structures are grossly intact. IMPRESSION: 1. No acute cardiopulmonary disease. 2. Small left pleural effusion and minor lung base scarring or atelectasis, similar to the most recent prior exam. Electronically Signed   By: Lajean Manes M.D.   On: 10/19/2021 16:31    Procedures Procedures    Medications Ordered in ED Medications  fentaNYL (SUBLIMAZE) injection 50 mcg (50 mcg Intravenous Given 10/19/21 1717)    ED Course/ Medical Decision Making/ A&P                          Medical Decision Making Amount and/or Complexity of Data Reviewed Labs: ordered. Decision-making details documented in ED Course. Radiology: ordered. Decision-making details documented in ED Course.  Risk Prescription drug management.   Medical Decision Making  This patient is Presenting for Evaluation as a level 2 trauma after a GLF, patient has a past medical history CVA, CAD, CHF, PAF on eliquis which complicates their presentation.  Of which does require a range of treatment options, and is a complaint that involves a high risk of morbidity and mortality.  Arrived in ED by:  EMS History obtained from: The patient  Limitations in history: none    At this time I am most concerned for intracranial abnormality. Also considering cervical spine injury, intrathoracic/intra-abdominal/intrapelvic injury, musculoskeletal injury. Plan for laboratory and imaging study work-up    Laboratory work-up significant for:  -Laboratory work-up obtained revealed no significant abnormality, CBC, CMP reassuring.  Coagulation panel revealed mildly elevated INR 1.4, patient is not on Coumadin.   Radiologic work-up was significant for: -Chest x-ray, pelvic x-ray, CT head and CT C-spine with  no acute abnormalities.   Interventions and Interval History: -Patient is generally well-appearing on initial exam, patient was hemodynamically stable, no acute distress.  GCS of 15, ABCs intact.  Patient no evidence of significant traumatic injury sustained in the fall.  He is complaining only of neck pain, he is not having headache, dizziness, chest pain, shortness of breath, palpitations or any other concerning symptoms.  Patient has been able to ambulate since the fall.  Due to the patient not having significant mechanism of injury,  the patient had no loss of consciousness, patient having no chest pain, shortness of breath, palpitations preceding the fall, patient not having sustained significant injury in the accident, full trauma imaging studies will not be obtained at this time.  Screening laboratory work-up will be obtained.  Chest x-ray and pelvis x-ray will be obtained for screening purposes and CT head and CT C-spine will also be obtained. -Patient has remained hemodynamically stable, his neurologic baseline, was generally well-appearing during his time in the emergency department.  Patient's laboratory and imaging study work-up is reassuring as there is no evidence of acute injury.  Cervical collar able to be cleared at the bedside by myself.  Patient no pain with range of motion of his neck, no midline tenderness palpation.  Patient developed no focal neurologic deficits, was able to ambulate without difficulty.  Patient felt to be appropriate for discharge at this time, patient instructed to follow-up with his PCP should he develop any significant muscle soreness that persists more than 2 to 3 days.  Patient with strict return cautions.      Disposition: Due to the patients current presenting symptoms, physical exam findings, and the workup stated above, it is thought that the etiology of the patients current presentation is ground-level fall  Discharge: Patient is felt to be medically  appropriate for discharge at this time. Patient was informed of all pertinent physical exam, laboratory, and imaging findings. Patients suspected etiology of their symptom presentation was discussed with the patient and all questions were answered. Patient was instructed to follow up with their primary care doctor in 2-3 days for re-evaluation. Patient was given strict return precautions.     The plan for this patient was discussed with Dr. Vanita Panda, who voiced agreement and who oversaw evaluation and treatment of this patient.     Clinical Complexity  A medically appropriate history, review of systems, and physical exam was performed.   I personally reviewed the lab and imaging studies discussed above.   MDM generated using voice dictation software and may contain dictation errors. Please contact me for any clarification or with any questions.               Final Clinical Impression(s) / ED Diagnoses Final diagnoses:  Fall, initial encounter    Rx / DC Orders ED Discharge Orders     None         Jarett Dralle, [place], MD 10/19/21 2320    Carmin Muskrat, MD 10/19/21 2326

## 2021-10-19 NOTE — Progress Notes (Signed)
Orthopedic Tech Progress Note Patient Details:  [name] 07-05-1929 [id number]  Patient ID: [name], male   DOB: 27-May-1929, 88 y.o.   MRN: [id number] Level II; not currently needed. Vernona Rieger 10/19/2021, 4:03 PM

## 2021-10-19 NOTE — ED Triage Notes (Signed)
Patient arrived by POV following fall on thinners. Was hit by child causing fall and striking back of head and neck, no loc. Patient takes eliquis. C-collar placed on arrival

## 2021-10-19 NOTE — Progress Notes (Signed)
   10/19/21 1553  Clinical Encounter Type  Visited With Patient not available;Health care provider  Visit Type ED;Trauma;Initial  Referral From Nurse  Consult/Referral To Chaplain Melvenia Beam)  Recommendations Level 2 Trauma   Responded to page in E.D. Room 20 for Level 2 Trauma. Scott Aquas. Bohnenkamp "Ray" being evaluated and treated by medical staff at this time, patient not seen by Chaplain.  No family present at this time.  Staff will page Chaplain upon request of patient or family.  Chaplain Ziv Welchel, M.Min., [phone].

## 2021-10-19 NOTE — ED Notes (Signed)
Received verbal report from [place] G RN at this time

## 2021-10-22 ENCOUNTER — Other Ambulatory Visit: Payer: Self-pay | Admitting: Family Medicine

## 2021-10-22 ENCOUNTER — Other Ambulatory Visit (HOSPITAL_COMMUNITY): Payer: Self-pay

## 2021-10-22 MED ORDER — TRAMADOL HCL 50 MG PO TABS: ORAL_TABLET | ORAL | 0 refills | Status: DC

## 2021-10-23 ENCOUNTER — Other Ambulatory Visit (HOSPITAL_COMMUNITY): Payer: Self-pay

## 2021-10-23 ENCOUNTER — Other Ambulatory Visit: Payer: Self-pay | Admitting: Family Medicine

## 2021-10-23 MED ORDER — TRAMADOL HCL 50 MG PO TABS
50.0000 mg | ORAL_TABLET | Freq: Three times a day (TID) | ORAL | 0 refills | Status: DC | PRN
  Filled 2021-10-23: qty 90, 30d supply, fill #0

## 2021-10-24 ENCOUNTER — Other Ambulatory Visit (HOSPITAL_COMMUNITY): Payer: Self-pay

## 2021-10-24 MED ORDER — CEPHALEXIN 500 MG PO CAPS
2000.0000 mg | ORAL_CAPSULE | ORAL | 0 refills | Status: DC
  Filled 2021-10-24: qty 19, 6d supply, fill #0

## 2021-10-28 ENCOUNTER — Telehealth: Payer: Self-pay | Admitting: Family Medicine

## 2021-10-28 NOTE — Telephone Encounter (Signed)
Patient's wife called to see if Dr. Charlett Blake recommends that he gets the new covid vaccine that is out now. Please call to advise.

## 2021-10-28 NOTE — Telephone Encounter (Signed)
Called pt wife was advised yes she do recommends

## 2021-11-07 ENCOUNTER — Other Ambulatory Visit (HOSPITAL_COMMUNITY): Payer: Self-pay

## 2021-11-10 ENCOUNTER — Other Ambulatory Visit (HOSPITAL_COMMUNITY): Payer: Self-pay

## 2021-11-19 ENCOUNTER — Other Ambulatory Visit: Payer: Self-pay | Admitting: Family Medicine

## 2021-11-19 ENCOUNTER — Other Ambulatory Visit (HOSPITAL_COMMUNITY): Payer: Self-pay

## 2021-11-19 MED ORDER — TRAMADOL HCL 50 MG PO TABS
50.0000 mg | ORAL_TABLET | Freq: Three times a day (TID) | ORAL | 0 refills | Status: DC | PRN
  Filled 2021-11-19 – 2021-11-20 (×2): qty 90, 30d supply, fill #0

## 2021-11-20 ENCOUNTER — Other Ambulatory Visit (HOSPITAL_COMMUNITY): Payer: Self-pay

## 2021-11-21 ENCOUNTER — Telehealth: Payer: Self-pay

## 2021-11-21 NOTE — Telephone Encounter (Signed)
   Reason for call: ED-Follow up call   Patient visit on 10/19/2021 at [facility] was for Fall  Have you been able to follow up with your primary care physician? - Yes  The patient was or was not able to obtain any needed medicine or equipment. - Was, Yes  Are there diet recommendations that you are having difficulty following? - No  Patient expresses understanding of discharge instructions and education provided has no other needs at this time.    [place] management  [place], [place]  Main Phone: [phone]  E-mail: [email or web address]  Website: [email or web address]

## 2021-12-08 DIAGNOSIS — C44612 Basal cell carcinoma of skin of right upper limb, including shoulder: Secondary | ICD-10-CM | POA: Diagnosis not present

## 2021-12-08 DIAGNOSIS — Z85828 Personal history of other malignant neoplasm of skin: Secondary | ICD-10-CM | POA: Diagnosis not present

## 2021-12-09 ENCOUNTER — Other Ambulatory Visit (HOSPITAL_COMMUNITY): Payer: Self-pay

## 2021-12-15 ENCOUNTER — Other Ambulatory Visit (HOSPITAL_COMMUNITY): Payer: Self-pay

## 2021-12-16 ENCOUNTER — Other Ambulatory Visit (HOSPITAL_COMMUNITY): Payer: Self-pay

## 2021-12-16 ENCOUNTER — Other Ambulatory Visit: Payer: Self-pay | Admitting: Family Medicine

## 2021-12-16 MED ORDER — TRAMADOL HCL 50 MG PO TABS
50.0000 mg | ORAL_TABLET | Freq: Three times a day (TID) | ORAL | 0 refills | Status: DC | PRN
  Filled 2021-12-16 – 2021-12-18 (×2): qty 90, 30d supply, fill #0

## 2021-12-16 NOTE — Telephone Encounter (Signed)
Requesting: tramadol 50mg   Contract:07/16/20 UDS: 07/16/20 Last Visit: 10/07/21 Next Visit: 04/07/22 Last Refill: 11/19/21 #90 and 0RF   Please Advise

## 2021-12-17 ENCOUNTER — Other Ambulatory Visit (HOSPITAL_COMMUNITY): Payer: Self-pay

## 2021-12-18 ENCOUNTER — Other Ambulatory Visit (HOSPITAL_COMMUNITY): Payer: Self-pay

## 2021-12-23 ENCOUNTER — Other Ambulatory Visit (HOSPITAL_COMMUNITY): Payer: Self-pay

## 2022-01-09 ENCOUNTER — Other Ambulatory Visit (HOSPITAL_COMMUNITY): Payer: Self-pay

## 2022-01-09 ENCOUNTER — Other Ambulatory Visit: Payer: Self-pay | Admitting: Family Medicine

## 2022-01-09 ENCOUNTER — Other Ambulatory Visit: Payer: Self-pay | Admitting: Internal Medicine

## 2022-01-09 DIAGNOSIS — I10 Essential (primary) hypertension: Secondary | ICD-10-CM

## 2022-01-09 DIAGNOSIS — I6350 Cerebral infarction due to unspecified occlusion or stenosis of unspecified cerebral artery: Secondary | ICD-10-CM

## 2022-01-09 DIAGNOSIS — E782 Mixed hyperlipidemia: Secondary | ICD-10-CM

## 2022-01-09 DIAGNOSIS — E559 Vitamin D deficiency, unspecified: Secondary | ICD-10-CM

## 2022-01-09 MED ORDER — ATORVASTATIN CALCIUM 40 MG PO TABS
40.0000 mg | ORAL_TABLET | Freq: Every day | ORAL | 0 refills | Status: DC
  Filled 2022-01-09: qty 90, 90d supply, fill #0

## 2022-01-09 MED ORDER — LISINOPRIL 2.5 MG PO TABS
2.5000 mg | ORAL_TABLET | Freq: Every day | ORAL | 0 refills | Status: DC
  Filled 2022-01-09: qty 90, 90d supply, fill #0

## 2022-01-13 ENCOUNTER — Other Ambulatory Visit (HOSPITAL_COMMUNITY): Payer: Self-pay

## 2022-01-13 ENCOUNTER — Other Ambulatory Visit: Payer: Self-pay

## 2022-01-13 ENCOUNTER — Other Ambulatory Visit: Payer: Self-pay | Admitting: Family Medicine

## 2022-01-13 ENCOUNTER — Other Ambulatory Visit: Payer: Self-pay | Admitting: Internal Medicine

## 2022-01-13 MED ORDER — CLONIDINE HCL 0.1 MG PO TABS
0.1000 mg | ORAL_TABLET | Freq: Two times a day (BID) | ORAL | 1 refills | Status: DC
  Filled 2022-01-13: qty 180, fill #0

## 2022-01-13 MED ORDER — METOCLOPRAMIDE HCL 5 MG PO TABS
ORAL_TABLET | Freq: Every day | ORAL | 1 refills | Status: DC
  Filled 2022-01-13: qty 30, 30d supply, fill #0
  Filled 2022-02-09: qty 30, 30d supply, fill #1

## 2022-01-14 ENCOUNTER — Other Ambulatory Visit (HOSPITAL_COMMUNITY): Payer: Self-pay

## 2022-01-14 MED ORDER — TRAMADOL HCL 50 MG PO TABS
50.0000 mg | ORAL_TABLET | Freq: Three times a day (TID) | ORAL | 0 refills | Status: DC | PRN
  Filled 2022-01-14 – 2022-01-16 (×3): qty 90, 30d supply, fill #0

## 2022-01-15 ENCOUNTER — Other Ambulatory Visit (HOSPITAL_COMMUNITY): Payer: Self-pay

## 2022-01-16 ENCOUNTER — Other Ambulatory Visit (HOSPITAL_COMMUNITY): Payer: Self-pay

## 2022-01-19 ENCOUNTER — Other Ambulatory Visit (HOSPITAL_COMMUNITY): Payer: Self-pay

## 2022-02-09 ENCOUNTER — Other Ambulatory Visit (HOSPITAL_COMMUNITY): Payer: Self-pay

## 2022-02-12 ENCOUNTER — Other Ambulatory Visit: Payer: Self-pay | Admitting: Family Medicine

## 2022-02-12 ENCOUNTER — Other Ambulatory Visit (HOSPITAL_COMMUNITY): Payer: Self-pay

## 2022-02-12 MED ORDER — TRAMADOL HCL 50 MG PO TABS
50.0000 mg | ORAL_TABLET | Freq: Three times a day (TID) | ORAL | 0 refills | Status: DC | PRN
  Filled 2022-02-12: qty 90, 30d supply, fill #0

## 2022-02-12 NOTE — Telephone Encounter (Signed)
Requesting: tramadol 50mg   Contract:07/16/20 UDS: 07/16/20 Last Visit: 10/07/21 Next Visit: 04/07/22 Last Refill: 01/14/22 #90 and 0RF   Please Advise

## 2022-02-13 ENCOUNTER — Other Ambulatory Visit (HOSPITAL_COMMUNITY): Payer: Self-pay

## 2022-02-14 ENCOUNTER — Other Ambulatory Visit (HOSPITAL_COMMUNITY): Payer: Self-pay

## 2022-03-10 ENCOUNTER — Other Ambulatory Visit: Payer: Self-pay | Admitting: Family Medicine

## 2022-03-10 ENCOUNTER — Other Ambulatory Visit (HOSPITAL_COMMUNITY): Payer: Self-pay

## 2022-03-10 MED ORDER — TRAMADOL HCL 50 MG PO TABS
50.0000 mg | ORAL_TABLET | Freq: Three times a day (TID) | ORAL | 0 refills | Status: DC | PRN
  Filled 2022-03-10 – 2022-03-13 (×2): qty 90, 30d supply, fill #0

## 2022-03-10 NOTE — Telephone Encounter (Signed)
Requesting: tramadol '50mg'$   Contract:07/16/20 UDS: 07/16/20 Last Visit: 10/07/21 Next Visit: 04/07/22 Last Refill: 08/11/22 #90 and 0RF   Please Advise

## 2022-03-11 ENCOUNTER — Other Ambulatory Visit (HOSPITAL_COMMUNITY): Payer: Self-pay

## 2022-03-11 ENCOUNTER — Ambulatory Visit: Payer: Medicare HMO | Admitting: Medical

## 2022-03-12 ENCOUNTER — Other Ambulatory Visit (HOSPITAL_COMMUNITY): Payer: Self-pay

## 2022-03-13 ENCOUNTER — Other Ambulatory Visit (HOSPITAL_COMMUNITY): Payer: Self-pay

## 2022-03-18 ENCOUNTER — Other Ambulatory Visit: Payer: Self-pay | Admitting: Internal Medicine

## 2022-03-18 ENCOUNTER — Other Ambulatory Visit (HOSPITAL_COMMUNITY): Payer: Self-pay

## 2022-03-18 MED ORDER — METOCLOPRAMIDE HCL 5 MG PO TABS
5.0000 mg | ORAL_TABLET | Freq: Every day | ORAL | 0 refills | Status: DC
  Filled 2022-03-18: qty 30, 30d supply, fill #0

## 2022-03-19 MED ORDER — MINOXIDIL 10 MG PO TABS
10.0000 mg | ORAL_TABLET | Freq: Two times a day (BID) | ORAL | 0 refills | Status: DC
  Filled 2022-03-19: qty 60, 30d supply, fill #0

## 2022-03-20 ENCOUNTER — Other Ambulatory Visit: Payer: Self-pay

## 2022-03-20 ENCOUNTER — Other Ambulatory Visit (HOSPITAL_COMMUNITY): Payer: Self-pay

## 2022-04-06 NOTE — Assessment & Plan Note (Signed)
Supplement and monitor 

## 2022-04-06 NOTE — Assessment & Plan Note (Signed)
Well controlled, no changes to meds. Encouraged heart healthy diet such as the DASH diet and exercise as tolerated.  °

## 2022-04-06 NOTE — Assessment & Plan Note (Signed)
Encourage heart healthy diet such as MIND or DASH diet, increase exercise, avoid trans fats, simple carbohydrates and processed foods, consider a krill or fish or flaxseed oil cap daily.  °

## 2022-04-06 NOTE — Assessment & Plan Note (Signed)
hgba1c acceptable, minimize simple carbs. Increase exercise as tolerated. Continue current meds 

## 2022-04-06 NOTE — Assessment & Plan Note (Signed)
Rate controlled tolerating Eliquis.  

## 2022-04-07 ENCOUNTER — Telehealth: Payer: Self-pay | Admitting: Internal Medicine

## 2022-04-07 ENCOUNTER — Ambulatory Visit (HOSPITAL_BASED_OUTPATIENT_CLINIC_OR_DEPARTMENT_OTHER)
Admission: RE | Admit: 2022-04-07 | Discharge: 2022-04-07 | Disposition: A | Discharge status: Home / Self Care | Payer: Medicare HMO | Source: Ambulatory Visit | Attending: Family Medicine | Admitting: Family Medicine

## 2022-04-07 ENCOUNTER — Ambulatory Visit (INDEPENDENT_AMBULATORY_CARE_PROVIDER_SITE_OTHER): Payer: Medicare HMO | Admitting: Family Medicine

## 2022-04-07 ENCOUNTER — Encounter: Payer: Self-pay | Admitting: Family Medicine

## 2022-04-07 ENCOUNTER — Other Ambulatory Visit (HOSPITAL_COMMUNITY): Payer: Self-pay

## 2022-04-07 VITALS — BP 144/72 | HR 70 | Temp 98.0°F | Resp 16 | Ht 67.0 in | Wt 142.4 lb

## 2022-04-07 DIAGNOSIS — I480 Paroxysmal atrial fibrillation: Secondary | ICD-10-CM

## 2022-04-07 DIAGNOSIS — R0602 Shortness of breath: Secondary | ICD-10-CM | POA: Diagnosis not present

## 2022-04-07 DIAGNOSIS — J45909 Unspecified asthma, uncomplicated: Secondary | ICD-10-CM | POA: Diagnosis not present

## 2022-04-07 DIAGNOSIS — J90 Pleural effusion, not elsewhere classified: Secondary | ICD-10-CM

## 2022-04-07 DIAGNOSIS — E782 Mixed hyperlipidemia: Secondary | ICD-10-CM

## 2022-04-07 DIAGNOSIS — E1165 Type 2 diabetes mellitus with hyperglycemia: Secondary | ICD-10-CM | POA: Diagnosis not present

## 2022-04-07 DIAGNOSIS — E559 Vitamin D deficiency, unspecified: Secondary | ICD-10-CM

## 2022-04-07 DIAGNOSIS — I10 Essential (primary) hypertension: Secondary | ICD-10-CM

## 2022-04-07 MED ORDER — LISINOPRIL 5 MG PO TABS
5.0000 mg | ORAL_TABLET | Freq: Every day | ORAL | 3 refills | Status: DC
  Filled 2022-04-07: qty 90, 90d supply, fill #0
  Filled 2022-07-08: qty 90, 90d supply, fill #1
  Filled 2022-10-28: qty 90, 90d supply, fill #2
  Filled 2023-01-28: qty 90, 90d supply, fill #3

## 2022-04-07 MED ORDER — HYOSCYAMINE SULFATE 0.125 MG SL SUBL
0.1250 mg | SUBLINGUAL_TABLET | SUBLINGUAL | 1 refills | Status: DC | PRN
  Filled 2022-04-07: qty 30, 5d supply, fill #0

## 2022-04-07 MED ORDER — ALBUTEROL SULFATE HFA 108 (90 BASE) MCG/ACT IN AERS: 1.0000 | INHALATION_SPRAY | Freq: Four times a day (QID) | RESPIRATORY_TRACT | 1 refills | Status: AC | PRN

## 2022-04-07 NOTE — Progress Notes (Signed)
Subjective:   By signing my name below, I, [name], attest that this documentation has been prepared under the direction and in the presence of [name], MD., 04/07/2022.   Patient ID: [name], male    DOB: [birth year], 89 y.o.   MRN: [id number]  Chief Complaint  Patient presents with   Follow-up    Follow up update UDS/[place]   HPI Patient is in today for an office visit and is accompanied by his wife.  Shortness of Breath/Sleep Apnea Patient reports that he experiences shortness of breath upon exertion and when laying down. He further reports that he wheezing occasionally and using his Albuterol inhaler as needed. Additionally, he is having episodes of choking at night and his wife states that he is snoring very loud. He is not interested in completing a sleep study at this time.  Hypertension Patient's blood pressure is slightly elevated today. He continues to follow with his cardiologist, Dr. Harrington Challenger, MD., and is currently taking Clonidine 0.1 mg, though only once daily instead of the recommended twice daily. He also takes Lisinopril 2.5 mg once daily. BP Readings from Last 3 Encounters:  04/07/22 (!) 144/72  10/19/21 (!) 168/52  10/07/21 (!) 115/55   Pulse Readings from Last 3 Encounters:  04/07/22 70  10/19/21 63  10/07/21 60   Lower Abdominal Cramps Patient reports that he is awakening every morning with lower abdominal cramps and occasional nausea. His wife has been giving him Ondansetron 8 mg to use as needed to manage this. He states that his cramps worsen when he eats too much. He further states that his bowel movements are normal and denies CP/palpitations/HA/fever/ chills/GU symptoms.  Supplements Patient currently takes otc iron daily.  Past Medical History:  Diagnosis Date   Acute bronchitis 06/10/2015   Allergic rhinitis    Anemia    Asthma    Carotid stenosis    a. s/p Left CEA 2002;  b. Carotid US 3/16:  patent R CEA, L < 40%   Chronic  pain syndrome    Left shoulder, back   Diverticulosis    Dyslipidemia    Epigastric pain 04/05/2016   Esophageal stricture    Distal, benign   GERD (gastroesophageal reflux disease)    Hemorrhoids    HTN (hypertension)    Negative renal duplex 07-22-11   Hx of echocardiogram    a. Echo 10/11: mod LVH, EF 55-60%   Pleural effusion 07/17/2020   Prostate cancer ([place]) 2000   Seed XRT   Spondylosis, cervical, with myelopathy 11/26/2015   Stroke ([place]) 8/01   right thalamic - on chronic Plavix/ASA    Past Surgical History:  Procedure Laterality Date   ANTERIOR APPROACH HEMI HIP ARTHROPLASTY Right 03/15/2021   Procedure: ANTERIOR APPROACH HEMI HIP ARTHROPLASTY;  Surgeon: Rod Can, MD;  Location: [facility];  Service: Orthopedics;  Laterality: Right;   APPENDECTOMY  1953   CAROTID ENDARTERECTOMY Right 01/2000   CATARACT EXTRACTION Bilateral    CERVICAL [facility] SURGERY  8/07   CHEST TUBE INSERTION Left 11/11/2020   Procedure: INSERTION PLEURAL DRAINAGE CATHETER;  Surgeon: [name], DO;  Location: [facility];  Service: Pulmonary;  Laterality: Left;  Indwelling Pleural Catheter   HIP ARTHROPLASTY Left 10/24/2018   Procedure: ARTHROPLASTY BIPOLAR HIP (HEMIARTHROPLASTY);  Surgeon: Paralee Cancel, MD;  Location: [facility];  Service: Orthopedics;  Laterality: Left;   INSERTION PROSTATE RADIATION SEED  2000   LUMBAR DISC SURGERY  1990s   NASAL SINUS SURGERY  x 3   REMOVAL OF PLEURAL DRAINAGE CATHETER N/A 12/24/2020   Procedure: MINOR REMOVAL OF PLEURAL DRAINAGE CATHETER;  Surgeon: [name], DO;  Location: [facility];  Service: Cardiopulmonary;  Laterality: N/A;   THORACENTESIS N/A 08/15/2020   Procedure: THORACENTESIS;  Surgeon: [name], DO;  Location: [facility];  Service: Pulmonary;  Laterality: N/A;    Family History  Problem Relation Age of Onset   Stroke Brother    Stroke Sister    Stroke Mother    Hyperlipidemia Mother    Hypertension Mother    Lung disease  Father    Diabetes Son    Gout Son    Obesity Sister    Alcohol abuse Brother    Cancer Brother        lung, smoker    Social History   Socioeconomic History   Marital status: Married    Spouse name: Not on file   Number of children: 1   Years of education: 10   Highest education level: Not on file  Occupational History   Occupation: Retired  Tobacco Use   Smoking status: Former    Packs/day: 0.25    Years: 35.00    Additional pack years: 0.00    Total pack years: 8.75    Types: Cigarettes    Start date: 1950    Quit date: 01/12/1978    Years since quitting: 44.2   Smokeless tobacco: Current    Types: Chew   Tobacco comments:    Started smoking at age 30  Vaping Use   Vaping Use: Never used  Substance and Sexual Activity   Alcohol use: No    Comment: Prior alcoholic, 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   Drug use: No   Sexual activity: Not Currently  Other Topics Concern   Not on file  Social History Narrative   Retired lives at home w/ his wife   1 daughter   Right-handed    Caffeine: drinks a lot of coffee    Former smoker, former alcoholic abstinent for many years   Social Determinants of Radio broadcast assistant Strain: Low Risk  (05/15/2021)   Overall Financial Resource Strain (CARDIA)    Difficulty of Paying Living Expenses: Not hard at all  Food Insecurity: No Food Insecurity (05/15/2021)   Hunger Vital Sign    Worried About Running Out of Food in the Last Year: Never true    [place] in the Last Year: Never true  Transportation Needs: No Transportation Needs (05/15/2021)   PRAPARE - Hydrologist (Medical): No    Lack of Transportation (Non-Medical): No  Physical Activity: Insufficiently Active (05/15/2021)   Exercise Vital Sign    Days of Exercise per Week: 1 day    Minutes of Exercise per Session: 50 min  Stress: No Stress Concern Present (05/15/2021)   [place]     Feeling of Stress : Not at all  Social Connections: [place] (05/15/2021)   Social Connection and Isolation Panel [NHANES]    Frequency of Communication with Friends and Family: More than three times a week    Frequency of Social Gatherings with Friends and Family: More than three times a week    Attends Religious Services: More than 4 times per year    Active Member of [employer] or Organizations: Yes    Attends Archivist Meetings: More than 4 times per year  Marital Status: Married  Human resources officer Violence: Not At Risk (05/15/2021)   Humiliation, Afraid, Rape, and Kick questionnaire    Fear of Current or Ex-Partner: No    Emotionally Abused: No    Physically Abused: No    Sexually Abused: No    Outpatient Medications Prior to Visit  Medication Sig Dispense Refill   acetaminophen (TYLENOL) 500 MG tablet Take 1,000 mg by mouth every 6 (six) hours as needed for mild pain or headache.     amLODipine (NORVASC) 5 MG tablet Take 2 tablets (10 mg total) by mouth daily (need office visit) 180 tablet 1   apixaban (ELIQUIS) 5 MG TABS tablet TAKE 1 TABLET BY MOUTH 2 TIMES DAILY 60 tablet 5   atorvastatin (LIPITOR) 40 MG tablet Take 1 tablet (40 mg total) by mouth daily. 90 tablet 0   Cholecalciferol (VITAMIN D3 PO) Take 1 tablet by mouth in the morning. Unsure of how many units     docusate sodium (COLACE) 100 MG capsule Take 300 mg by mouth 2 (two) times daily.     Ferrous Fumarate (HEMOCYTE - 106 MG FE) 324 (106 Fe) MG TABS tablet Take 1 tablet (106 mg of iron total) by mouth 2 times daily. 30 tablet 2   metoCLOPramide (REGLAN) 5 MG tablet Take 1 tablet (5 mg total) by mouth at bedtime. 30 tablet 0   minoxidil (LONITEN) 10 MG tablet Take 1 tablet (10 mg total) by mouth 2 (two) times daily. 60 tablet 0   ondansetron (ZOFRAN ODT) 8 MG disintegrating tablet Dissolve 1 tablet by mouth every 8 hours as needed for nausea or vomiting. 20 tablet 0   polyvinyl alcohol (LIQUIFILM TEARS)  1.4 % ophthalmic solution Place 1 drop into both eyes 5 (five) times daily as needed for dry eyes.     sennosides-docusate sodium (SENOKOT-S) 8.6-50 MG tablet Take 1-2 tablets by mouth See admin instructions. Take 1 tablet in the morning & take 2 tablet at nigh     traMADol (ULTRAM) 50 MG tablet Take 1 tablet (50 mg total) by mouth 3 (three) times daily as needed. 90 tablet 0   triamcinolone cream (KENALOG) 0.1 % Apply topically to affected areas of the skin daily as needed. 454 g 1   triamcinolone cream (KENALOG) 0.5 % Apply 1 application topically daily as needed (for skin irritation).      albuterol (VENTOLIN HFA) 108 (90 Base) MCG/ACT inhaler INHALE 2 PUFFS BY MOUTH INTO THE LUNGS EVERY 6 HOURS AS NEEDED 18 g 1   cephALEXin (KEFLEX) 500 MG capsule Take 4 capsules (2,000 mg total) by mouth 30 minutes before surgery then 1 capsule 3 times a day for 5 days 19 capsule 0   lisinopril (ZESTRIL) 2.5 MG tablet Take 1 tablet (2.5 mg total) by mouth daily. 90 tablet 0   gabapentin (NEURONTIN) 300 MG capsule TAKE 1 CAPSULE BY MOUTH 3 TIMES DAILY (Patient taking differently: Take 300 mg by mouth at bedtime.) 270 capsule 1   pantoprazole (PROTONIX) 40 MG tablet TAKE 1 TABLET BY MOUTH ONCE A DAY BEFORE BREAKFAST 90 tablet 3   cloNIDine (CATAPRES) 0.1 MG tablet Take 1 tablet (0.1 mg total) by mouth 2 (two) times daily. (Patient not taking: Reported on 04/07/2022) 180 tablet 1   No facility-administered medications prior to visit.    Allergies  Allergen Reactions   Hctz [Hydrochlorothiazide] Shortness Of Breath, Swelling and Other (See Comments)    "Swelling and dyspnea"    Hydralazine Shortness Of Breath and  Other (See Comments)    Chest pain and GI issues also   Diovan [Valsartan] Other (See Comments)    Elevated potassium- Hyperkalemia   Metformin And Related Other (See Comments)    Reaction not recalled   Nsaids Other (See Comments)    Other than Tylenol, he isn't to have these because he's on  Eliquis   Other Other (See Comments)    Unnamed topically-applied B/P patch = Caused redness   Prednisone Other (See Comments)    Suicidal thoughts   Spironolactone Swelling    Site of swelling not recalled   Codeine Itching and Nausea Only   Oxycodone-Acetaminophen Itching and Nausea Only    Review of Systems  Constitutional:  Negative for chills and fever.  Respiratory:  Positive for shortness of breath.   Cardiovascular:  Positive for orthopnea. Negative for chest pain and palpitations.  Gastrointestinal:  Positive for abdominal pain.  Genitourinary:  Negative for dysuria, frequency, hematuria and urgency.  Skin:           Neurological:  Negative for headaches.      Objective:    Physical Exam Constitutional:      General: He is not in acute distress.    Appearance: Normal appearance. He is normal weight. He is not ill-appearing.  HENT:     Head: Normocephalic and atraumatic.     Right Ear: External ear normal.     Left Ear: External ear normal.     Nose: Nose normal.     Mouth/Throat:     Mouth: Mucous membranes are moist.     Pharynx: Oropharynx is clear.  Eyes:     General:        Right eye: No discharge.        Left eye: No discharge.     Extraocular Movements: Extraocular movements intact.     Conjunctiva/sclera: Conjunctivae normal.     Pupils: Pupils are equal, round, and reactive to light.  Cardiovascular:     Rate and Rhythm: Normal rate and regular rhythm.     Pulses: Normal pulses.     Heart sounds: Normal heart sounds. No murmur heard.    No gallop.  Pulmonary:     Effort: Pulmonary effort is normal. No respiratory distress.     Breath sounds: Normal breath sounds. No wheezing or rales.  Abdominal:     General: Bowel sounds are normal.     Palpations: Abdomen is soft.     Tenderness: There is no abdominal tenderness. There is no guarding.  Musculoskeletal:        General: Normal range of motion.     Cervical back: Normal range of motion.      Right lower leg: No edema.     Left lower leg: No edema.  Skin:    General: Skin is warm and dry.  Neurological:     Mental Status: He is alert and oriented to person, place, and time.  Psychiatric:        Mood and Affect: Mood normal.        Behavior: Behavior normal.        Judgment: Judgment normal.     BP (!) 144/72 (BP Location: Right Arm, Patient Position: Sitting, Cuff Size: Normal)   Pulse 70   Temp 98 F (36.7 C) (Oral)   Resp 16   Ht 5\' 7"  (1.702 m)   Wt 142 lb 6.4 oz (64.6 kg)   SpO2 95%   BMI 22.30 kg/m  Wt  Readings from Last 3 Encounters:  04/07/22 142 lb 6.4 oz (64.6 kg)  10/19/21 140 lb (63.5 kg)  10/07/21 140 lb 9.6 oz (63.8 kg)    Diabetic Foot Exam - Simple   No data filed    Lab Results  Component Value Date   WBC 7.8 10/19/2021   HGB 10.9 (L) 10/19/2021   HCT 32.0 (L) 10/19/2021   PLT 257 10/19/2021   GLUCOSE 147 (H) 10/19/2021   CHOL 120 10/07/2021   TRIG 86.0 10/07/2021   HDL 50.30 10/07/2021   LDLCALC 53 10/07/2021   ALT 7 10/19/2021   AST 15 10/19/2021   NA 138 10/19/2021   K 4.4 10/19/2021   CL 104 10/19/2021   CREATININE 1.20 10/19/2021   BUN 27 (H) 10/19/2021   CO2 25 10/19/2021   TSH 2.58 10/07/2021   PSA 0.00 (L) 04/17/2021   INR 1.4 (H) 10/19/2021   HGBA1C 5.5 10/07/2021    Lab Results  Component Value Date   TSH 2.58 10/07/2021   Lab Results  Component Value Date   WBC 7.8 10/19/2021   HGB 10.9 (L) 10/19/2021   HCT 32.0 (L) 10/19/2021   MCV 91.4 10/19/2021   PLT 257 10/19/2021   Lab Results  Component Value Date   NA 138 10/19/2021   K 4.4 10/19/2021   CO2 25 10/19/2021   GLUCOSE 147 (H) 10/19/2021   BUN 27 (H) 10/19/2021   CREATININE 1.20 10/19/2021   BILITOT 0.3 10/19/2021   ALKPHOS 58 10/19/2021   AST 15 10/19/2021   ALT 7 10/19/2021   PROT 6.1 (L) 10/19/2021   ALBUMIN 3.5 10/19/2021   CALCIUM 8.0 (L) 10/19/2021   ANIONGAP 7 10/19/2021   EGFR 62 07/18/2021   GFR 56.18 (L) 10/07/2021   Lab  Results  Component Value Date   CHOL 120 10/07/2021   Lab Results  Component Value Date   HDL 50.30 10/07/2021   Lab Results  Component Value Date   LDLCALC 53 10/07/2021   Lab Results  Component Value Date   TRIG 86.0 10/07/2021   Lab Results  Component Value Date   CHOLHDL 2 10/07/2021   Lab Results  Component Value Date   HGBA1C 5.5 10/07/2021      Assessment & Plan:  Healthy Lifestyle: Encouraged 6-8 hours of sleep, heart healthy diet, 60-80 oz of non-alcohol/non-caffeinated fluids, and minimum of 4000 steps daily.  Hypertension: Clonidine 0.1 mg terminated and Lisinopril 2.5 mg once daily increased to twice daily (5 mg total).  Labs: Routine blood work ordered today.  Lower Abdominal Cramps: Hyoscyamine 0.125 mg sl tablet prescribed: place 1 tablet under the tongue every 4 hours as needed.  Shortness of Breath/Sleep Apnea: Chest x-ray ordered today.  Problem List Items Addressed This Visit     Asthma   Relevant Medications   albuterol (VENTOLIN HFA) 108 (90 Base) MCG/ACT inhaler   Controlled type 2 diabetes mellitus with hyperglycemia, without long-term current use of insulin (HCC)    hgba1c acceptable, minimize simple carbs. Increase exercise as tolerated. Continue current meds       Relevant Medications   lisinopril (ZESTRIL) 5 MG tablet   Other Relevant Orders   HgB A1c   Essential hypertension - Primary    Well controlled, no changes to meds. Encouraged heart healthy diet such as the DASH diet and exercise as tolerated.        Relevant Medications   lisinopril (ZESTRIL) 5 MG tablet   Other Relevant Orders   CBC  with Differential/Platelet   Comprehensive metabolic panel   TSH   HLD (hyperlipidemia)    Encourage heart healthy diet such as MIND or DASH diet, increase exercise, avoid trans fats, simple carbohydrates and processed foods, consider a krill or fish or flaxseed oil cap daily.        Relevant Medications   lisinopril (ZESTRIL) 5 MG  tablet   Other Relevant Orders   Lipid panel   Hypocalcemia    Supplement and monitor       PAF (paroxysmal atrial fibrillation) (HCC)    Rate controlled tolerating Eliquis      Relevant Medications   lisinopril (ZESTRIL) 5 MG tablet   Pleural effusion   Relevant Orders   DG Chest 2 View   SOB (shortness of breath)    He notes some worsening sob over past month he notes he awakens at night feeling sob and has to get up to breath right. With a history or recurrent pleural effusion will proceed with cxr today      Relevant Orders   DG Chest 2 View   Vitamin D deficiency    Supplement and monitor       Relevant Orders   VITAMIN D 25 Hydroxy (Vit-D Deficiency, Fractures)   Meds ordered this encounter  Medications   lisinopril (ZESTRIL) 5 MG tablet    Sig: Take 1 tablet (5 mg) by mouth daily.    Dispense:  90 tablet    Refill:  3   hyoscyamine (LEVSIN SL) 0.125 MG SL tablet    Sig: Place 1 tablet under the tongue every 4 hours as needed.    Dispense:  30 tablet    Refill:  1   albuterol (VENTOLIN HFA) 108 (90 Base) MCG/ACT inhaler    Sig: Inhale 1-2 puffs into the lungs every 6 (six) hours as needed for wheezing or shortness of breath.    Dispense:  18 g    Refill:  1   I, Penni Homans, MD, personally preformed the services described in this documentation.  All medical record entries made by the scribe were at my direction and in my presence.  I have reviewed the chart and discharge instructions (if applicable) and agree that the record reflects my personal performance and is accurate and complete. 04/07/2022  I,Mohammed Iqbal,acting as a scribe for Penni Homans, MD.,have documented all relevant documentation on the behalf of Penni Homans, MD,as directed by  Penni Homans, MD while in the presence of Penni Homans, MD.  Penni Homans, MD

## 2022-04-07 NOTE — Patient Instructions (Signed)
Hypertension, Adult High blood pressure (hypertension) is when the force of blood pumping through the arteries is too strong. The arteries are the blood vessels that carry blood from the heart throughout the body. Hypertension forces the heart to work harder to pump blood and may cause arteries to become narrow or stiff. Untreated or uncontrolled hypertension can lead to a heart attack, heart failure, a stroke, kidney disease, and other problems. A blood pressure reading consists of a higher number over a lower number. Ideally, your blood pressure should be below 120/80. The first ("top") number is called the systolic pressure. It is a measure of the pressure in your arteries as your heart beats. The second ("bottom") number is called the diastolic pressure. It is a measure of the pressure in your arteries as the heart relaxes. What are the causes? The exact cause of this condition is not known. There are some conditions that result in high blood pressure. What increases the risk? Certain factors may make you more likely to develop high blood pressure. Some of these risk factors are under your control, including: Smoking. Not getting enough exercise or physical activity. Being overweight. Having too much fat, sugar, calories, or salt (sodium) in your diet. Drinking too much alcohol. Other risk factors include: Having a personal history of heart disease, diabetes, high cholesterol, or kidney disease. Stress. Having a family history of high blood pressure and high cholesterol. Having obstructive sleep apnea. Age. The risk increases with age. What are the signs or symptoms? High blood pressure may not cause symptoms. Very high blood pressure (hypertensive crisis) may cause: Headache. Fast or irregular heartbeats (palpitations). Shortness of breath. Nosebleed. Nausea and vomiting. Vision changes. Severe chest pain, dizziness, and seizures. How is this diagnosed? This condition is diagnosed by  measuring your blood pressure while you are seated, with your arm resting on a flat surface, your legs uncrossed, and your feet flat on the floor. The cuff of the blood pressure monitor will be placed directly against the skin of your upper arm at the level of your heart. Blood pressure should be measured at least twice using the same arm. Certain conditions can cause a difference in blood pressure between your right and left arms. If you have a high blood pressure reading during one visit or you have normal blood pressure with other risk factors, you may be asked to: Return on a different day to have your blood pressure checked again. Monitor your blood pressure at home for 1 week or longer. If you are diagnosed with hypertension, you may have other blood or imaging tests to help your health care provider understand your overall risk for other conditions. How is this treated? This condition is treated by making healthy lifestyle changes, such as eating healthy foods, exercising more, and reducing your alcohol intake. You may be referred for counseling on a healthy diet and physical activity. Your health care provider may prescribe medicine if lifestyle changes are not enough to get your blood pressure under control and if: Your systolic blood pressure is above 130. Your diastolic blood pressure is above 80. Your personal target blood pressure may vary depending on your medical conditions, your age, and other factors. Follow these instructions at home: Eating and drinking  Eat a diet that is high in fiber and potassium, and low in sodium, added sugar, and fat. An example of this eating plan is called the DASH diet. DASH stands for Dietary Approaches to Stop Hypertension. To eat this way: Eat   plenty of fresh fruits and vegetables. Try to fill one half of your plate at each meal with fruits and vegetables. Eat whole grains, such as whole-wheat pasta, brown rice, or whole-grain bread. Fill about one  fourth of your plate with whole grains. Eat or drink low-fat dairy products, such as skim milk or low-fat yogurt. Avoid fatty cuts of meat, processed or cured meats, and poultry with skin. Fill about one fourth of your plate with lean proteins, such as fish, chicken without skin, beans, eggs, or tofu. Avoid pre-made and processed foods. These tend to be higher in sodium, added sugar, and fat. Reduce your daily sodium intake. Many people with hypertension should eat less than 1,500 mg of sodium a day. Do not drink alcohol if: Your health care provider tells you not to drink. You are pregnant, may be pregnant, or are planning to become pregnant. If you drink alcohol: Limit how much you have to: 0-1 drink a day for women. 0-2 drinks a day for men. Know how much alcohol is in your drink. In the [place] one drink equals one 12 oz bottle of beer (355 mL), one 5 oz glass of wine (148 mL), or one 1 oz glass of hard liquor (44 mL). Lifestyle  Work with your health care provider to maintain a healthy body weight or to lose weight. Ask what an ideal weight is for you. Get at least 30 minutes of exercise that causes your heart to beat faster (aerobic exercise) most days of the week. Activities may include walking, swimming, or biking. Include exercise to strengthen your muscles (resistance exercise), such as Pilates or lifting weights, as part of your weekly exercise routine. Try to do these types of exercises for 30 minutes at least 3 days a week. Do not use any products that contain nicotine or tobacco. These products include cigarettes, chewing tobacco, and vaping devices, such as e-cigarettes. If you need help quitting, ask your health care provider. Monitor your blood pressure at home as told by your health care provider. Keep all follow-up visits. This is important. Medicines Take over-the-counter and prescription medicines only as told by your health care provider. Follow directions carefully. Blood  pressure medicines must be taken as prescribed. Do not skip doses of blood pressure medicine. Doing this puts you at risk for problems and can make the medicine less effective. Ask your health care provider about side effects or reactions to medicines that you should watch for. Contact a health care provider if you: Think you are having a reaction to a medicine you are taking. Have headaches that keep coming back (recurring). Feel dizzy. Have swelling in your ankles. Have trouble with your vision. Get help right away if you: Develop a severe headache or confusion. Have unusual weakness or numbness. Feel faint. Have severe pain in your chest or abdomen. Vomit repeatedly. Have trouble breathing. These symptoms may be an emergency. Get help right away. Call 911. Do not wait to see if the symptoms will go away. Do not drive yourself to the hospital. Summary Hypertension is when the force of blood pumping through your arteries is too strong. If this condition is not controlled, it may put you at risk for serious complications. Your personal target blood pressure may vary depending on your medical conditions, your age, and other factors. For most people, a normal blood pressure is less than 120/80. Hypertension is treated with lifestyle changes, medicines, or a combination of both. Lifestyle changes include losing weight, eating a healthy,   low-sodium diet, exercising more, and limiting alcohol. This information is not intended to replace advice given to you by your health care provider. Make sure you discuss any questions you have with your health care provider. Document Revised: 11/05/2020 Document Reviewed: 11/05/2020 Elsevier Patient Education  2023 Elsevier Inc.  

## 2022-04-07 NOTE — Assessment & Plan Note (Signed)
Supplement and monitor 

## 2022-04-07 NOTE — Assessment & Plan Note (Signed)
He notes some worsening sob over past month he notes he awakens at night feeling sob and has to get up to breath right. With a history or recurrent pleural effusion will proceed with cxr today

## 2022-04-07 NOTE — Telephone Encounter (Signed)
Pt c/o medication issue:  1. Name of Medication: apixaban (ELIQUIS) 5 MG TABS tablet   2. How are you currently taking this medication (dosage and times per day)? 1 tablet twice daily  3. Are you having a reaction (difficulty breathing--STAT)? no  4. What is your medication issue? Patient's wife calling in regards to getting a discount on her eliquis. She says she has not gotten the paperwork to fill out for it and is not sure if the office does this automatically. She says to call any time after 11:30 am. She is also calling in regards to herself and I have sent a separate phone note for her.

## 2022-04-08 LAB — VITAMIN D 25 HYDROXY (VIT D DEFICIENCY, FRACTURES): VITD: 18.62 ng/mL — ABNORMAL LOW (ref 30.00–100.00)

## 2022-04-08 LAB — CBC WITH DIFFERENTIAL/PLATELET
Basophils Absolute: 0 10*3/uL (ref 0.0–0.1)
Basophils Relative: 0.7 % (ref 0.0–3.0)
Eosinophils Absolute: 0.1 10*3/uL (ref 0.0–0.7)
Eosinophils Relative: 1.7 % (ref 0.0–5.0)
HCT: 34.6 % — ABNORMAL LOW (ref 39.0–52.0)
Hemoglobin: 11.6 g/dL — ABNORMAL LOW (ref 13.0–17.0)
Lymphocytes Relative: 15.4 % (ref 12.0–46.0)
Lymphs Abs: 1 10*3/uL (ref 0.7–4.0)
MCHC: 33.5 g/dL (ref 30.0–36.0)
MCV: 91.3 fl (ref 78.0–100.0)
Monocytes Absolute: 0.5 10*3/uL (ref 0.1–1.0)
Monocytes Relative: 7.4 % (ref 3.0–12.0)
Neutro Abs: 4.8 10*3/uL (ref 1.4–7.7)
Neutrophils Relative %: 74.8 % (ref 43.0–77.0)
Platelets: 340 10*3/uL (ref 150.0–400.0)
RBC: 3.79 Mil/uL — ABNORMAL LOW (ref 4.22–5.81)
RDW: 15.5 % (ref 11.5–15.5)
WBC: 6.4 10*3/uL (ref 4.0–10.5)

## 2022-04-08 LAB — LIPID PANEL
Cholesterol: 128 mg/dL (ref 0–200)
HDL: 55.2 mg/dL (ref >39.00)
LDL Cholesterol: 53 mg/dL (ref 0–99)
NonHDL: 72.83
Total CHOL/HDL Ratio: 2
Triglycerides: 99 mg/dL (ref 0.0–149.0)
VLDL: 19.8 mg/dL (ref 0.0–40.0)

## 2022-04-08 LAB — COMPREHENSIVE METABOLIC PANEL
ALT: 5 U/L (ref 0–53)
AST: 13 U/L (ref 0–37)
Albumin: 3.9 g/dL (ref 3.5–5.2)
Alkaline Phosphatase: 56 U/L (ref 39–117)
BUN: 16 mg/dL (ref 6–23)
CO2: 30 mEq/L (ref 19–32)
Calcium: 9 mg/dL (ref 8.4–10.5)
Chloride: 106 mEq/L (ref 96–112)
Creatinine, Ser: 0.99 mg/dL (ref 0.40–1.50)
GFR: 67.01 mL/min (ref >60.00)
Glucose, Bld: 121 mg/dL — ABNORMAL HIGH (ref 70–99)
Potassium: 4.4 mEq/L (ref 3.5–5.1)
Sodium: 142 mEq/L (ref 135–145)
Total Bilirubin: 0.4 mg/dL (ref 0.2–1.2)
Total Protein: 6.4 g/dL (ref 6.0–8.3)

## 2022-04-08 LAB — TSH: TSH: 3.66 u[IU]/mL (ref 0.35–5.50)

## 2022-04-08 LAB — HEMOGLOBIN A1C: Hgb A1c MFr Bld: 5.5 % (ref 4.6–6.5)

## 2022-04-08 NOTE — Telephone Encounter (Signed)
**Note De-Identified Messina Kosinski Obfuscation** The pt's wife and DPR, [name] states that she just wanted me to be aware that she plans to bring her and the pts BMSPA[employer] applications for Eliquis assistance to drop off in the front office either tomorrow or Friday of this this week.  She had several questions to ask concerning the applications and I answered all to her satisfaction. She is aware to call Jeani Hawking at Dr Alan Ripper office if she has any questions or concerns about their BMSPAF applications going forward.  She thanked me for calling her back.

## 2022-04-09 ENCOUNTER — Other Ambulatory Visit: Payer: Self-pay

## 2022-04-09 ENCOUNTER — Other Ambulatory Visit (HOSPITAL_COMMUNITY): Payer: Self-pay

## 2022-04-09 ENCOUNTER — Other Ambulatory Visit: Payer: Self-pay | Admitting: Family Medicine

## 2022-04-09 ENCOUNTER — Other Ambulatory Visit (HOSPITAL_BASED_OUTPATIENT_CLINIC_OR_DEPARTMENT_OTHER): Payer: Self-pay

## 2022-04-09 DIAGNOSIS — J90 Pleural effusion, not elsewhere classified: Secondary | ICD-10-CM

## 2022-04-09 MED ORDER — DOXYCYCLINE HYCLATE 100 MG PO TABS
100.0000 mg | ORAL_TABLET | Freq: Two times a day (BID) | ORAL | 0 refills | Status: DC
  Filled 2022-04-09: qty 20, 10d supply, fill #0

## 2022-04-09 MED ORDER — VITAMIN D (ERGOCALCIFEROL) 1.25 MG (50000 UNIT) PO CAPS
50000.0000 [IU] | ORAL_CAPSULE | ORAL | 4 refills | Status: DC
  Filled 2022-04-09 (×2): qty 5, 35d supply, fill #0
  Filled 2022-05-12: qty 5, 35d supply, fill #1

## 2022-04-10 ENCOUNTER — Other Ambulatory Visit (HOSPITAL_COMMUNITY): Payer: Self-pay

## 2022-04-10 ENCOUNTER — Other Ambulatory Visit: Payer: Self-pay | Admitting: Family Medicine

## 2022-04-10 DIAGNOSIS — E559 Vitamin D deficiency, unspecified: Secondary | ICD-10-CM

## 2022-04-10 DIAGNOSIS — I10 Essential (primary) hypertension: Secondary | ICD-10-CM

## 2022-04-10 DIAGNOSIS — I6350 Cerebral infarction due to unspecified occlusion or stenosis of unspecified cerebral artery: Secondary | ICD-10-CM

## 2022-04-10 DIAGNOSIS — E782 Mixed hyperlipidemia: Secondary | ICD-10-CM

## 2022-04-13 ENCOUNTER — Other Ambulatory Visit: Payer: Self-pay | Admitting: Family Medicine

## 2022-04-13 ENCOUNTER — Telehealth: Payer: Self-pay

## 2022-04-13 ENCOUNTER — Other Ambulatory Visit (HOSPITAL_COMMUNITY): Payer: Self-pay

## 2022-04-13 MED ORDER — TRAMADOL HCL 50 MG PO TABS
50.0000 mg | ORAL_TABLET | Freq: Three times a day (TID) | ORAL | 0 refills | Status: DC | PRN
  Filled 2022-04-13: qty 90, 30d supply, fill #0

## 2022-04-13 MED ORDER — ATORVASTATIN CALCIUM 40 MG PO TABS
40.0000 mg | ORAL_TABLET | Freq: Every day | ORAL | 0 refills | Status: DC
  Filled 2022-04-13: qty 90, 90d supply, fill #0

## 2022-04-13 NOTE — Telephone Encounter (Signed)
Initial Comment Caller states the pharmacy told him his doctors office denied his refill on his tramadol. He has bad back pain and was in the office the other day and signed the yearly agreement papers about getting his Tramadol refilled so he doesn't understand why it was denied and he really needs it. Translation No Disp. Time Eilene Ghazi Time) Disposition Final User 04/10/2022 2:15:37 PM Attempt made - message left Quentin Ore RN, April 04/10/2022 2:47:25 PM FINAL ATTEMPT MADE - no message left Yes Quentin Ore, RN, April Final Disposition 04/10/2022 2:47:25 PM FINAL ATTEMPT MADE - no message left Yes Quentin Ore, RN, April Comments User: April, Lambert, RN Date/Time Eilene Ghazi Time): 04/10/2022 2:47:42 PM VM full, unable to leave msg

## 2022-04-13 NOTE — Telephone Encounter (Signed)
Called pt and sent message to Dr.Blyth regarding  Refill. Pt was advised it was sent to provider.

## 2022-04-14 ENCOUNTER — Other Ambulatory Visit: Payer: Self-pay | Admitting: Internal Medicine

## 2022-04-14 ENCOUNTER — Telehealth: Payer: Self-pay

## 2022-04-14 NOTE — Telephone Encounter (Signed)
Pt has requested if he could get any samples of eliquis, please contact.

## 2022-04-15 ENCOUNTER — Telehealth: Payer: Self-pay

## 2022-04-15 ENCOUNTER — Other Ambulatory Visit (HOSPITAL_COMMUNITY): Payer: Self-pay

## 2022-04-15 DIAGNOSIS — H02102 Unspecified ectropion of right lower eyelid: Secondary | ICD-10-CM | POA: Diagnosis not present

## 2022-04-15 MED ORDER — AMLODIPINE BESYLATE 5 MG PO TABS
10.0000 mg | ORAL_TABLET | Freq: Every day | ORAL | 1 refills | Status: DC
  Filled 2022-04-15: qty 180, 90d supply, fill #0
  Filled 2022-07-15: qty 180, 90d supply, fill #1

## 2022-04-15 MED ORDER — MINOXIDIL 10 MG PO TABS
10.0000 mg | ORAL_TABLET | Freq: Two times a day (BID) | ORAL | 0 refills | Status: DC
  Filled 2022-04-15: qty 30, 15d supply, fill #0

## 2022-04-15 MED ORDER — METOCLOPRAMIDE HCL 5 MG PO TABS
5.0000 mg | ORAL_TABLET | Freq: Every day | ORAL | 0 refills | Status: DC
  Filled 2022-04-15: qty 30, 30d supply, fill #0

## 2022-04-15 NOTE — Telephone Encounter (Signed)
Patient's wife returned call.  

## 2022-04-15 NOTE — Telephone Encounter (Signed)
**Note De-Identified Amelya Mabry Obfuscation** No answer so I left a message asking the pt to call Jeani Hawking back at Dr Alan Ripper office [facility] at [phone].

## 2022-04-15 NOTE — Telephone Encounter (Signed)
Eliquis 5 mg samples placed at front desk for pick up today.

## 2022-04-15 NOTE — Telephone Encounter (Signed)
**Note De-Identified Dreyton Roessner Obfuscation** The pts wife, [name] is advised that we plan to fax her and the pts BMSPAF/Eliquis applications to [employer] today.  She is also aware that we are leaving her and the pt 2 weeks of Eliquis 5mg  samples in the front office for them to pick up as the are almost out.

## 2022-04-15 NOTE — Telephone Encounter (Signed)
**Note De-Identified Emy Angevine Obfuscation** The pts completed BMSPAF application for Eliquis assistance was left at the office with documents.  I have completed the providers page of his application and have e-mailed all to the nurse working with our DOD, Dr Curt Bears so she can obtain his signature, date it, and to then fax all to [facility] at the fax number written on the cover letter included.

## 2022-04-15 NOTE — Telephone Encounter (Signed)
**Note De-Identified [name] Obfuscation** The pts wife, [name] is advised that we plan to fax her and the pts BMSPAF/Eliquis applications to [employer]MS[employer] today.

## 2022-04-16 ENCOUNTER — Telehealth: Payer: Self-pay | Admitting: Internal Medicine

## 2022-04-16 NOTE — Telephone Encounter (Signed)
Patient assistance forms signed and faxed. 

## 2022-04-16 NOTE — Telephone Encounter (Signed)
Rodman Key, RN, has taken care of this matter. Thanks  [employer]

## 2022-04-16 NOTE — Telephone Encounter (Signed)
Left message on machine to call back  

## 2022-04-16 NOTE — Telephone Encounter (Signed)
Patient wife calling to schedule appointment for upper stomach pain, wanting to know if he is able to take Bentyl. Please advise

## 2022-04-17 ENCOUNTER — Other Ambulatory Visit (HOSPITAL_COMMUNITY): Payer: Self-pay

## 2022-04-20 NOTE — Telephone Encounter (Signed)
Pt wife Randa Evens stated that pt has been having off and on  upper abdominal pain. Pt stated that she gave the pt some her Bentyl recently and  stated that  helped some. Randa Evens was notified that we do not recommend pt taking the medications that are not prescribed. Pt previously prescribed the LEVSIN SL. Was notified to continue to take that as prescribed. Pt has been previously scheduled to see Doug Sou PA on 04/28/2022 at 3:00 PM.  Randa Evens reminded. Randa Evens verbalized understanding with all questions answered.

## 2022-04-28 ENCOUNTER — Encounter: Payer: Self-pay | Admitting: Gastroenterology

## 2022-04-28 ENCOUNTER — Ambulatory Visit: Payer: Medicare HMO | Admitting: Gastroenterology

## 2022-04-28 VITALS — BP 140/82 | HR 89 | Ht 67.0 in | Wt 136.4 lb

## 2022-04-28 DIAGNOSIS — R110 Nausea: Secondary | ICD-10-CM | POA: Diagnosis not present

## 2022-04-28 DIAGNOSIS — R634 Abnormal weight loss: Secondary | ICD-10-CM

## 2022-04-28 DIAGNOSIS — R1032 Left lower quadrant pain: Secondary | ICD-10-CM

## 2022-04-28 NOTE — Progress Notes (Signed)
04/28/2022 ANTIONIO Harmon [id number] January 11, 1930   HISTORY OF PRESENT ILLNESS: This is a 89 year old male who is a patient of Dr. Marvell Fuller.  He is here today with complaints of left-sided abdominal pain.  He is here with his wife.  She says that this has been going on for few weeks, maybe over a month.  A lot of times it is a dull pain, but he says that it gets to sometimes an 8 or 9 out of 10 on the pain scale.  Sometimes he will have some nausea, but no vomiting.  He feels like he is moving his bowels well with stool softeners and his laxative regimen that he has been using for a while.  No rectal bleeding.  He says that he has lost some weight, looks like down maybe 6 pounds or so from when he was seen at his PCPs office a few weeks ago.  His PCP gave him some Levsin to use, he is not sure if that is made a difference in the pain or not.  He is able to sleep at night, when he wakes up in the morning the pain is still the same.  Past Medical History:  Diagnosis Date   Acute bronchitis 06/10/2015   Allergic rhinitis    Anemia    Asthma    Carotid stenosis    a. s/p Left CEA 2002;  b. Carotid US 3/16:  patent R CEA, L < 40%   Chronic pain syndrome    Left shoulder, back   Diverticulosis    Dyslipidemia    Epigastric pain 04/05/2016   Esophageal stricture    Distal, benign   GERD (gastroesophageal reflux disease)    Hemorrhoids    HTN (hypertension)    Negative renal duplex 07-22-11   Hx of echocardiogram    a. 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/ASA   Past Surgical History:  Procedure Laterality Date   ANTERIOR APPROACH HEMI HIP ARTHROPLASTY Right 03/15/2021   Procedure: ANTERIOR APPROACH HEMI HIP ARTHROPLASTY;  Surgeon: Samson Frederic, MD;  Location: [facility] OR;  Service: Orthopedics;  Laterality: Right;   APPENDECTOMY  1953    CAROTID ENDARTERECTOMY Right 01/2000   CATARACT EXTRACTION Bilateral    CERVICAL DISC SURGERY  8/07   CHEST TUBE INSERTION Left 11/11/2020   Procedure: INSERTION PLEURAL DRAINAGE CATHETER;  Surgeon: Josephine Igo, DO;  Location: [facility] ENDOSCOPY;  Service: Pulmonary;  Laterality: Left;  Indwelling Pleural Catheter   HIP ARTHROPLASTY Left 10/24/2018   Procedure: ARTHROPLASTY BIPOLAR HIP (HEMIARTHROPLASTY);  Surgeon: Durene Romans, MD;  Location: [facility] OR;  Service: Orthopedics;  Laterality: Left;   INSERTION PROSTATE RADIATION SEED  2000   LUMBAR DISC SURGERY  1990s   NASAL SINUS SURGERY     x 3   REMOVAL OF PLEURAL DRAINAGE CATHETER N/A 12/24/2020   Procedure: MINOR REMOVAL OF PLEURAL DRAINAGE CATHETER;  Surgeon: Josephine Igo, DO;  Location: [facility];  Service: Cardiopulmonary;  Laterality: N/A;   THORACENTESIS N/A 08/15/2020   Procedure: THORACENTESIS;  Surgeon: Josephine Igo, DO;  Location: [facility] ENDOSCOPY;  Service: Pulmonary;  Laterality: N/A;    reports that he quit smoking about 44 years ago. His smoking use included cigarettes. He started smoking about 74 years ago. He has a 8.75 pack-year smoking history.  His smokeless tobacco use includes chew. He reports that he does not drink alcohol and does not use drugs. family history includes Alcohol abuse in his brother; Cancer in his brother; Diabetes in his son; Gout in his son; Hyperlipidemia in his mother; Hypertension in his mother; Lung disease in his father; Obesity in his sister; Stroke in his brother, mother, and sister. Allergies  Allergen Reactions   Hctz [Hydrochlorothiazide] Shortness Of Breath, Swelling and Other (See Comments)    "Swelling and dyspnea"    Hydralazine Shortness Of Breath and Other (See Comments)    Chest pain and GI issues also   Diovan [Valsartan] Other (See Comments)    Elevated potassium- Hyperkalemia   Metformin And Related Other (See Comments)    Reaction not recalled   Nsaids Other (See Comments)     Other than Tylenol, he isn't to have these because he's on Eliquis   Other Other (See Comments)    Unnamed topically-applied B/P patch = Caused redness   Prednisone Other (See Comments)    Suicidal thoughts   Spironolactone Swelling    Site of swelling not recalled   Codeine Itching and Nausea Only   Oxycodone-Acetaminophen Itching and Nausea Only      Outpatient Encounter Medications as of 04/28/2022  Medication Sig   acetaminophen (TYLENOL) 500 MG tablet Take 1,000 mg by mouth every 6 (six) hours as needed for mild pain or headache.   albuterol (VENTOLIN HFA) 108 (90 Base) MCG/ACT inhaler Inhale 1-2 puffs into the lungs every 6 (six) hours as needed for wheezing or shortness of breath.   amLODipine (NORVASC) 5 MG tablet Take 2 tablets (10 mg total) by mouth daily (need office visit)   apixaban (ELIQUIS) 5 MG TABS tablet TAKE 1 TABLET BY MOUTH 2 TIMES DAILY   atorvastatin (LIPITOR) 40 MG tablet Take 1 tablet (40 mg total) by mouth daily.   Cholecalciferol (VITAMIN D3 PO) Take 1 tablet by mouth in the morning. Unsure of how many units   docusate sodium (COLACE) 100 MG capsule Take 300 mg by mouth 2 (two) times daily.   doxycycline (VIBRA-TABS) 100 MG tablet Take 1 tablet (100 mg total) by mouth 2 (two) times daily.   Ferrous Fumarate (HEMOCYTE - 106 MG FE) 324 (106 Fe) MG TABS tablet Take 1 tablet (106 mg of iron total) by mouth 2 times daily.   hyoscyamine (LEVSIN SL) 0.125 MG SL tablet Place 1 tablet under the tongue every 4 hours as needed.   lisinopril (ZESTRIL) 5 MG tablet Take 1 tablet (5 mg) by mouth daily.   metoCLOPramide (REGLAN) 5 MG tablet Take 1 tablet (5 mg total) by mouth at bedtime. Call for appointment for future refills.   minoxidil (LONITEN) 10 MG tablet Take 1 tablet (10 mg total) by mouth 2 (two) times daily.   ondansetron (ZOFRAN ODT) 8 MG disintegrating tablet Dissolve 1 tablet by mouth every 8 hours as needed for nausea or vomiting.   polyvinyl alcohol  (LIQUIFILM TEARS) 1.4 % ophthalmic solution Place 1 drop into both eyes 5 (five) times daily as needed for dry eyes.   sennosides-docusate sodium (SENOKOT-S) 8.6-50 MG tablet Take 1-2 tablets by mouth See admin instructions. Take 1 tablet in the morning & take 2 tablet at nigh   traMADol (ULTRAM) 50 MG tablet Take 1 tablet (50 mg total) by mouth 3 (three) times daily as needed.   triamcinolone cream (KENALOG) 0.1 % Apply topically to affected areas of the skin daily as needed.  triamcinolone cream (KENALOG) 0.5 % Apply 1 application topically daily as needed (for skin irritation).    Vitamin D, Ergocalciferol, (DRISDOL) 1.25 MG (50000 UNIT) CAPS capsule Take 1 capsule (50,000 Units total) by mouth every 7 (seven) days.   gabapentin (NEURONTIN) 300 MG capsule TAKE 1 CAPSULE BY MOUTH 3 TIMES DAILY (Patient taking differently: Take 300 mg by mouth at bedtime.)   pantoprazole (PROTONIX) 40 MG tablet TAKE 1 TABLET BY MOUTH ONCE A DAY BEFORE BREAKFAST   No facility-administered encounter medications on file as of 04/28/2022.     REVIEW OF SYSTEMS  : All other systems reviewed and negative except where noted in the History of Present Illness.   PHYSICAL EXAM: BP (!) 140/82   Pulse 89   Ht 5\' 7"  (1.702 m)   Wt 136 lb 6 oz (61.9 kg)   BMI 21.36 kg/m  General: Well developed white male in no acute distress Head: Normocephalic and atraumatic Eyes:  Sclerae anicteric, conjunctiva pink. Ears: Normal auditory acuity Lungs: Clear throughout to auscultation; no W/R/R. Heart: Regular rate and rhythm; no M/R/G. Abdomen: Soft, non-distended .  BS present.  Small umbilical hernia noted.  Left sided TTP. Musculoskeletal: Symmetrical with no gross deformities  Skin: No lesions on visible extremities Extremities: No edema  Neurological: Alert oriented x 4, grossly non-focal Psychological:  Alert and cooperative. Normal mood and affect  ASSESSMENT AND PLAN: *Left-sided abdominal pain/lower quadrant  abdominal pain: Has been present for a few weeks, maybe over a month.  Has had episodes of nausea and some weight loss as well, 6 pounds just over the past few weeks.  Plan for CT scan of the abdomen and pelvis with contrast.  Has Levsin that he can use as needed in the interim.  CC:  Bradd Canary, MD

## 2022-04-28 NOTE — Patient Instructions (Signed)
You have been scheduled for a CT scan of the abdomen and pelvis at [facility]. You are scheduled on Sunday 05/10/22 at 11 am. Please arrive at 9 am to drink oral contrast.   Please follow the written instructions below on the day of your exam:   1) Do not eat anything after 7 am (4 hours prior to your test)   You may take any medications as prescribed with a small amount of water, if necessary. If you take any of the following medications: METFORMIN, GLUCOPHAGE, GLUCOVANCE, AVANDAMET, RIOMET, FORTAMET, ACTOPLUS MET, JANUMET, GLUMETZA or METAGLIP, you MAY be asked to HOLD this medication 48 hours AFTER the exam.   The purpose of you drinking the oral contrast is to aid in the visualization of your intestinal tract. The contrast solution may cause some diarrhea. Depending on your individual set of symptoms, you may also receive an intravenous injection of x-ray contrast/dye. Plan on being at [facility] for 45 minutes or longer, depending on the type of exam you are having performed.   If you have any questions regarding your exam or if you need to reschedule, you may call [facility] at [phone] between the hours of 8:00 am and 5:00 pm, Monday-Friday.   _______________________________________________________  If your blood pressure at your visit was 140/90 or greater, please contact your primary care physician to follow up on this.  _______________________________________________________  If you are age 83 or older, your body mass index should be between 23-30. Your There is no height or weight on file to calculate BMI. If this is out of the aforementioned range listed, please consider follow up with your Primary Care Provider.  If you are age 87 or younger, your body mass index should be between 19-25. Your There is no height or weight on file to calculate BMI. If this is out of the aformentioned range listed, please consider follow up with your Primary Care Provider.    ________________________________________________________  The [facility] providers would like to encourage you to use [facility] to communicate with providers for non-urgent requests or questions.  Due to long hold times on the telephone, sending your provider a message by [facility] may be a faster and more efficient way to get a response.  Please allow 48 business hours for a response.  Please remember that this is for non-urgent requests.  _______________________________________________________

## 2022-04-28 NOTE — Telephone Encounter (Addendum)
**Note De-Identified Tymara Saur Obfuscation** Letter received from [facility] stating that they have denied the pt Eliquis assistance at this time. Reason for denial: Documentation of 3% out of pocket RX expenses, based on the households adjusted gross income, not met. [id number]  The letter states that they have notified the pt of this denial as well.

## 2022-04-29 ENCOUNTER — Other Ambulatory Visit: Payer: Self-pay

## 2022-04-29 ENCOUNTER — Other Ambulatory Visit (HOSPITAL_COMMUNITY): Payer: Self-pay

## 2022-04-30 ENCOUNTER — Other Ambulatory Visit: Payer: Self-pay | Admitting: Internal Medicine

## 2022-04-30 ENCOUNTER — Other Ambulatory Visit (HOSPITAL_COMMUNITY): Payer: Self-pay

## 2022-04-30 MED ORDER — MINOXIDIL 10 MG PO TABS
10.0000 mg | ORAL_TABLET | Freq: Two times a day (BID) | ORAL | 0 refills | Status: DC
  Filled 2022-04-30: qty 180, 90d supply, fill #0

## 2022-04-30 MED ORDER — AMOXICILLIN 500 MG PO CAPS
500.0000 mg | ORAL_CAPSULE | Freq: Three times a day (TID) | ORAL | 0 refills | Status: AC
  Filled 2022-04-30: qty 21, 7d supply, fill #0

## 2022-05-01 ENCOUNTER — Telehealth: Payer: Self-pay | Admitting: Internal Medicine

## 2022-05-01 ENCOUNTER — Other Ambulatory Visit (HOSPITAL_COMMUNITY): Payer: Self-pay

## 2022-05-01 NOTE — Telephone Encounter (Signed)
Pt's spouse would like a callback in regards to Patient assistance being denied for Eliquis. Please advise

## 2022-05-04 ENCOUNTER — Telehealth: Payer: Self-pay | Admitting: *Deleted

## 2022-05-04 ENCOUNTER — Other Ambulatory Visit (HOSPITAL_COMMUNITY): Payer: Self-pay

## 2022-05-04 ENCOUNTER — Other Ambulatory Visit: Payer: Self-pay

## 2022-05-04 DIAGNOSIS — I480 Paroxysmal atrial fibrillation: Secondary | ICD-10-CM

## 2022-05-04 MED ORDER — APIXABAN 5 MG PO TABS
5.0000 mg | ORAL_TABLET | Freq: Two times a day (BID) | ORAL | 3 refills | Status: DC
Start: 2022-05-04 — End: 2023-06-04
  Filled 2022-05-04 – 2022-06-04 (×3): qty 180, 90d supply, fill #0

## 2022-05-04 NOTE — Telephone Encounter (Signed)
**Note De-Identified Angeles Paolucci Obfuscation** Per the pts wife's request, I have e-scribed a 90 day refill of the pts Eliquis to [facility] to fill. Randa Evens, the pts wife and DPR states that they lack over $350 in meeting their 3% OOP RX expenses that is required for BMSPAF Eliquis assistance approval and that a 90 day supply should get them close to meeting their 3%.  Randa Evens also requested our fax number so she can provide it to the pts dentist as she has concerns of him having a tooth extracted while he is taking Eliquis. I did give her out fax number of [phone].  She thanked me for my assistance.

## 2022-05-04 NOTE — Telephone Encounter (Signed)
   Pre-operative Risk Assessment    Patient Name: [name]  DOB: 1929-12-29 MRN: [id number]     Request for Surgical Clearance    Procedure:  Dental Extraction - Amount of Teeth to be Pulled:  3 TEETH TO BE EXTRACTED  Date of Surgery:  Clearance 05/11/22                                 Surgeon:   Surgeon's Group or Practice Name:  [facility] Phone number:  NOT LISTED Fax number:  [phone]   Type of Clearance Requested:   - Medical  - Pharmacy:  Hold Apixaban (Eliquis)     Type of Anesthesia:   LIDOCAINE   Additional requests/questions:    Elpidio Anis   05/04/2022, 5:30 PM

## 2022-05-05 ENCOUNTER — Other Ambulatory Visit (HOSPITAL_COMMUNITY): Payer: Self-pay

## 2022-05-05 ENCOUNTER — Telehealth: Payer: Self-pay | Admitting: Family Medicine

## 2022-05-05 NOTE — Telephone Encounter (Signed)
Patient with diagnosis of afib on Eliquis for anticoagulation.    Procedure: 3 dental extractions  Date of procedure: 05/11/2022   CHA2DS2-VASc Score = 8   This indicates a 10.8% annual risk of stroke. The patient's score is based upon: CHF History: 1 HTN History: 1 Diabetes History: 1 Stroke History: 2 Vascular Disease History: 1 Age Score: 2 Gender Score: 0     CrCl 46 mL/min (SrCr 0.99 on 04/07/2022) Platelet count 340 K  Patient does not require pre-op antibiotics for dental procedure.  Given elevated cardiac risk for this low bleed procedure, recommend holding 1 dose of Eliquis (12 hours) prior to procedure and resume as soon as safely afterwards

## 2022-05-05 NOTE — Telephone Encounter (Signed)
Attempted to call patient to review instructions for Eliquis and to advise that patient is due for an appointment. Left message for call back.  Levi Aland, NP-C  05/05/2022, 11:37 AM [address] Office [phone] Fax [phone]

## 2022-05-05 NOTE — Telephone Encounter (Signed)
Contacted [name] to schedule their annual wellness visit. Appointment made for 05/22/2022.  [name]; Care Guide Ambulatory Clinical Support [place] [facility] Direct Dial: [phone]

## 2022-05-06 NOTE — Telephone Encounter (Signed)
I s/w the pt and his wife. Pt has been scheduled to see Edd Fabian, FNP 05/08/22 at 2:45 for pre op clearance at the [facility] location. [name] thanked me for the help. I will update the [facility] office of the upcoming appt as well.

## 2022-05-06 NOTE — Progress Notes (Signed)
Cardiology Clinic Note   Patient Name: [name] Date of Encounter: 05/08/2022  Primary Care Provider:  Bradd Canary, MD Primary Cardiologist:  Dietrich Pates, MD  Patient Profile    [name] 89 year old male presents to the clinic today for follow-up evaluation of his paroxysmal atrial fibrillation, chronic diastolic CHF and preoperative cardiac evaluation.  Past Medical History    Past Medical History:  Diagnosis Date   Acute bronchitis 06/10/2015   Allergic rhinitis    Anemia    Asthma    Carotid stenosis    a. s/p Left CEA 2002;  b. Carotid US 3/16:  patent R CEA, L < 40%   Chronic pain syndrome    Left shoulder, back   Diverticulosis    Dyslipidemia    Epigastric pain 04/05/2016   Esophageal stricture    Distal, benign   GERD (gastroesophageal reflux disease)    Hemorrhoids    HTN (hypertension)    Negative renal duplex 07-22-11   Hx of echocardiogram    a. Echo 10/11: mod LVH, EF 55-60%   Pleural effusion 07/17/2020   Prostate cancer (HCC) 2000   Seed XRT   Spondylosis, cervical, with myelopathy 11/26/2015   Stroke (HCC) 8/01   right thalamic - on chronic Plavix/ASA   Past Surgical History:  Procedure Laterality Date   ANTERIOR APPROACH HEMI HIP ARTHROPLASTY Right 03/15/2021   Procedure: ANTERIOR APPROACH HEMI HIP ARTHROPLASTY;  Surgeon: Samson Frederic, MD;  Location: [facility] OR;  Service: Orthopedics;  Laterality: Right;   APPENDECTOMY  1953   CAROTID ENDARTERECTOMY Right 01/2000   CATARACT EXTRACTION Bilateral    CERVICAL DISC SURGERY  8/07   CHEST TUBE INSERTION Left 11/11/2020   Procedure: INSERTION PLEURAL DRAINAGE CATHETER;  Surgeon: Josephine Igo, DO;  Location: [facility];  Service: Pulmonary;  Laterality: Left;  Indwelling Pleural Catheter   HIP ARTHROPLASTY Left 10/24/2018   Procedure: ARTHROPLASTY BIPOLAR HIP (HEMIARTHROPLASTY);  Surgeon: Durene Romans, MD;  Location: [facility] OR;  Service: Orthopedics;  Laterality: Left;   INSERTION PROSTATE  RADIATION SEED  2000   LUMBAR DISC SURGERY  1990s   NASAL SINUS SURGERY     x 3   REMOVAL OF PLEURAL DRAINAGE CATHETER N/A 12/24/2020   Procedure: MINOR REMOVAL OF PLEURAL DRAINAGE CATHETER;  Surgeon: Josephine Igo, DO;  Location: [facility];  Service: Cardiopulmonary;  Laterality: N/A;   THORACENTESIS N/A 08/15/2020   Procedure: THORACENTESIS;  Surgeon: Josephine Igo, DO;  Location: [facility];  Service: Pulmonary;  Laterality: N/A;    Allergies  Allergies  Allergen Reactions   Hctz [Hydrochlorothiazide] Shortness Of Breath, Swelling and Other (See Comments)    "Swelling and dyspnea"    Hydralazine Shortness Of Breath and Other (See Comments)    Chest pain and GI issues also   Diovan [Valsartan] Other (See Comments)    Elevated potassium- Hyperkalemia   Metformin And Related Other (See Comments)    Reaction not recalled   Nsaids Other (See Comments)    Other than Tylenol, he isn't to have these because he's on Eliquis   Other Other (See Comments)    Unnamed topically-applied B/P patch = Caused redness   Prednisone Other (See Comments)    Suicidal thoughts   Spironolactone Swelling    Site of swelling not recalled   Codeine Itching and Nausea Only   Oxycodone-Acetaminophen Itching and Nausea Only    History of Present Illness    [name] has a PMH of  labile hypertension, cardiovascular disease status post left CEA 2002, hyperlipidemia, GERD, reactive airways disease, esophageal stricture, and paroxysmal atrial fibrillation.  His echocardiogram in 2020 showed an LVEF and right ventricular function that were normal.  He was seen in follow-up by Dr. Tenny Craw 11/21.  He had been seen by pulmonology for pleural effusion.  He underwent thoracentesis 7/22.  Cytology was negative for malignancy.  His effusion recurred.  He diuresed with low-dose furosemide.  He underwent repeat thoracentesis and 2 L were removed on 8/4.  Again cytology was negative.  He was seen in follow-up  by Dr. Tenny Craw on 07/01/2021.  He remained stable from a cardiac standpoint.  He did note some back pain.  He was planning to have back injection.  His blood pressure was well-controlled.  He was noted to be in sinus rhythm and denied palpitations.  Follow-up was planned for 8 months.  He presents to the clinic today for preoperative cardiac evaluation and follow-up evaluation.  He states he feels well.  He reports compliance with his medication and denies side effects.  We reviewed his upcoming dental extractions and recommendations for Eliquis hold.  He and his wife expressed understanding.  We reviewed his last visit with Dr. Tenny Craw and medications.  He follows with Dr. Tonia Brooms for pulmonology.  He remains stable from a cardiac standpoint today.  He is able to complete greater than 4 METS of physical activity.  Will plan follow-up in 6 to 8 months.  Today he denies chest pain, shortness of breath, lower extremity edema, fatigue, palpitations, melena, hematuria, hemoptysis, diaphoresis, weakness, presyncope, syncope, orthopnea, and PND.     Home Medications    Prior to Admission medications   Medication Sig Start Date End Date Taking? Authorizing Provider  acetaminophen (TYLENOL) 500 MG tablet Take 1,000 mg by mouth every 6 (six) hours as needed for mild pain or headache.    [provider]  albuterol (VENTOLIN HFA) 108 (90 Base) MCG/ACT inhaler Inhale 1-2 puffs into the lungs every 6 (six) hours as needed for wheezing or shortness of breath. 04/07/22   Bradd Canary, MD  amLODipine (NORVASC) 5 MG tablet Take 2 tablets (10 mg total) by mouth daily (need office visit) 04/15/22   Pricilla Riffle, MD  amoxicillin (AMOXIL) 500 MG capsule Take 1 capsule (500 mg total) by mouth 3 (three) times daily for 7 days. 04/30/22 05/07/22    apixaban (ELIQUIS) 5 MG TABS tablet Take 1 tablet (5 mg total) by mouth 2 (two) times daily. 05/04/22 05/04/23  Pricilla Riffle, MD  atorvastatin (LIPITOR) 40 MG tablet Take 1  tablet (40 mg total) by mouth daily. 04/13/22   Bradd Canary, MD  Cholecalciferol (VITAMIN D3 PO) Take 1 tablet by mouth in the morning. Unsure of how many units    [provider]  docusate sodium (COLACE) 100 MG capsule Take 300 mg by mouth 2 (two) times daily.    [provider]  doxycycline (VIBRA-TABS) 100 MG tablet Take 1 tablet (100 mg total) by mouth 2 (two) times daily. 04/09/22   Bradd Canary, MD  Ferrous Fumarate (HEMOCYTE - 106 MG FE) 324 (106 Fe) MG TABS tablet Take 1 tablet (106 mg of iron total) by mouth 2 times daily. 08/07/21   Bradd Canary, MD  gabapentin (NEURONTIN) 300 MG capsule TAKE 1 CAPSULE BY MOUTH 3 TIMES DAILY Patient taking differently: Take 300 mg by mouth at bedtime. 12/23/20 12/23/21  Bradd Canary, MD  hyoscyamine (  LEVSIN SL) 0.125 MG SL tablet Place 1 tablet under the tongue every 4 hours as needed. 04/07/22   Bradd Canary, MD  lisinopril (ZESTRIL) 5 MG tablet Take 1 tablet (5 mg) by mouth daily. 04/07/22   Bradd Canary, MD  metoCLOPramide (REGLAN) 5 MG tablet Take 1 tablet (5 mg total) by mouth at bedtime. Call for appointment for future refills. 04/15/22   Iva Boop, MD  minoxidil (LONITEN) 10 MG tablet Take 1 tablet (10 mg) by mouth 2 times daily. Please call Dr. Tenny Craw for yearly appointment for June 2024 [phone] 04/30/22   Pricilla Riffle, MD  ondansetron [facility] ODT) 8 MG disintegrating tablet Dissolve 1 tablet by mouth every 8 hours as needed for nausea or vomiting. 08/23/20   Bradd Canary, MD  pantoprazole (PROTONIX) 40 MG tablet TAKE 1 TABLET BY MOUTH ONCE A DAY BEFORE BREAKFAST 03/18/20 07/01/21  Iva Boop, MD  polyvinyl alcohol (LIQUIFILM TEARS) 1.4 % ophthalmic solution Place 1 drop into both eyes 5 (five) times daily as needed for dry eyes.    [provider]  sennosides-docusate sodium (SENOKOT-S) 8.6-50 MG tablet Take 1-2 tablets by mouth See admin instructions. Take 1 tablet in the morning & take 2 tablet at  nigh    [provider]  traMADol (ULTRAM) 50 MG tablet Take 1 tablet (50 mg total) by mouth 3 (three) times daily as needed. 04/13/22 10/10/22  Bradd Canary, MD  triamcinolone cream (KENALOG) 0.1 % Apply topically to affected areas of the skin daily as needed. 10/16/21     triamcinolone cream (KENALOG) 0.5 % Apply 1 application topically daily as needed (for skin irritation).  01/22/12   Etta Grandchild, MD  Vitamin D, Ergocalciferol, (DRISDOL) 1.25 MG (50000 UNIT) CAPS capsule Take 1 capsule (50,000 Units total) by mouth every 7 (seven) days. 04/09/22   Bradd Canary, MD    Family History    Family History  Problem Relation Age of Onset   Stroke Brother    Stroke Sister    Stroke Mother    Hyperlipidemia Mother    Hypertension Mother    Lung disease Father    Diabetes Son    Gout Son    Obesity Sister    Alcohol abuse Brother    Cancer Brother        lung, smoker   He indicated that his mother is deceased. He indicated that his father is deceased. He indicated that only one of his two sisters is alive. He indicated that all of his three brothers are deceased. He indicated that his maternal grandmother is deceased. He indicated that his maternal grandfather is deceased. He indicated that his paternal grandmother is deceased. He indicated that his paternal grandfather is deceased. He indicated that his son is alive.  Social History    Social History   Socioeconomic History   Marital status: Married    Spouse name: Not on file   Number of children: 1   Years of education: 10   Highest education level: Not on file  Occupational History   Occupation: Retired  Tobacco Use   Smoking status: Former    Packs/day: 0.25    Years: 35.00    Additional pack years: 0.00    Total pack years: 8.75    Types: Cigarettes    Start date: 1950    Quit date: 01/12/1978    Years since quitting: 44.3   Smokeless tobacco: Current    Types:  Chew   Tobacco comments:    Started smoking  at age 85  Vaping Use   Vaping Use: Never used  Substance and Sexual Activity   Alcohol use: No    Comment: Prior alcoholic, sober since 2002   Drug use: No   Sexual activity: Not Currently  Other Topics Concern   Not on file  Social History Narrative   Retired lives at home w/ his wife   1 daughter   Right-handed    Caffeine: drinks a lot of coffee    Former smoker, former alcoholic abstinent for many years   Social Determinants of Corporate investment banker Strain: Low Risk  (05/15/2021)   Overall Financial Resource Strain (CARDIA)    Difficulty of Paying Living Expenses: Not hard at all  Food Insecurity: No Food Insecurity (05/15/2021)   Hunger Vital Sign    Worried About Running Out of Food in the Last Year: Never true    Ran Out of Food in the Last Year: Never true  Transportation Needs: No Transportation Needs (05/15/2021)   PRAPARE - Administrator, Civil Service (Medical): No    Lack of Transportation (Non-Medical): No  Physical Activity: Insufficiently Active (05/15/2021)   Exercise Vital Sign    Days of Exercise per Week: 1 day    Minutes of Exercise per Session: 50 min  Stress: No Stress Concern Present (05/15/2021)   [employer] of Occupational Health - Occupational Stress Questionnaire    Feeling of Stress : Not at all  Social Connections: Socially Integrated (05/15/2021)   Social Connection and Isolation Panel [NHANES]    Frequency of Communication with Friends and Family: More than three times a week    Frequency of Social Gatherings with Friends and Family: More than three times a week    Attends Religious Services: More than 4 times per year    Active Member of [employer] or Organizations: Yes    Attends Engineer, structural: More than 4 times per year    Marital Status: Married  Catering manager Violence: Not At Risk (05/15/2021)   Humiliation, Afraid, Rape, and Kick questionnaire    Fear of Current or Ex-Partner: No    Emotionally Abused: No     Physically Abused: No    Sexually Abused: No     Review of Systems    General:  No chills, fever, night sweats or weight changes.  Cardiovascular:  No chest pain, dyspnea on exertion, edema, orthopnea, palpitations, paroxysmal nocturnal dyspnea. Dermatological: No rash, lesions/masses Respiratory: No cough, dyspnea Urologic: No hematuria, dysuria Abdominal:   No nausea, vomiting, diarrhea, bright red blood per rectum, melena, or hematemesis Neurologic:  No visual changes, wkns, changes in mental status. All other systems reviewed and are otherwise negative except as noted above.  Physical Exam    VS:  BP 128/60   Pulse 92   Ht 5\' 11"  (1.803 m)   Wt 134 lb (60.8 kg)   SpO2 96%   BMI 18.69 kg/m  , BMI Body mass index is 18.69 kg/m. GEN: Well nourished, well developed, in no acute distress. HEENT: normal. Neck: Supple, no JVD, carotid bruits, or masses. Cardiac: RRR, no murmurs, rubs, or gallops. No clubbing, cyanosis, edema.  Radials/DP/PT 2+ and equal bilaterally.  Respiratory:  Respirations regular and unlabored, clear to auscultation bilaterally. GI: Soft, nontender, nondistended, BS + x 4. MS: no deformity or atrophy. Skin: warm and dry, no rash. Neuro:  Strength and sensation are intact. Psych:  Normal affect.  Accessory Clinical Findings    Recent Labs: 04/07/2022: ALT 5; BUN 16; Creatinine, Ser 0.99; Hemoglobin 11.6; Platelets 340.0; Potassium 4.4; Sodium 142; TSH 3.66   Recent Lipid Panel    Component Value Date/Time   CHOL 128 04/07/2022 1414   TRIG 99.0 04/07/2022 1414   HDL 55.20 04/07/2022 1414   CHOLHDL 2 04/07/2022 1414   VLDL 19.8 04/07/2022 1414   LDLCALC 53 04/07/2022 1414         ECG personally reviewed by me today-normal sinus rhythm left axis deviation anterior septal infarct undetermined age 43 bpm- No acute changes  Echocardiogram 08/29/2020  IMPRESSIONS     1. Left ventricular ejection fraction, by estimation, is 65 to 70%. The   left ventricle has normal function. The left ventricle has no regional  wall motion abnormalities. Left ventricular diastolic parameters are  consistent with Grade I diastolic  dysfunction (impaired relaxation).   2. Right ventricular systolic function is normal. The right ventricular  size is normal. There is mildly elevated pulmonary artery systolic  pressure. The estimated right ventricular systolic pressure is 43.0 mmHg.   3. Right atrial size was mildly dilated.   4. The mitral valve is grossly normal. No evidence of mitral valve  regurgitation. No evidence of mitral stenosis.   5. The aortic valve is tricuspid. Aortic valve regurgitation is not  visualized. No aortic stenosis is present.   6. The inferior vena cava is dilated in size with >50% respiratory  variability, suggesting right atrial pressure of 8 mmHg.   Comparison(s): Changes from prior study are noted. EF unchanged. G1DD.  Elevated RVSP~43 mmHG.   FINDINGS   Left Ventricle: Left ventricular ejection fraction, by estimation, is 65  to 70%. The left ventricle has normal function. The left ventricle has no  regional wall motion abnormalities. The left ventricular internal cavity  size was normal in size. There is   no left ventricular hypertrophy. Left ventricular diastolic parameters  are consistent with Grade I diastolic dysfunction (impaired relaxation).   Right Ventricle: The right ventricular size is normal. No increase in  right ventricular wall thickness. Right ventricular systolic function is  normal. There is mildly elevated pulmonary artery systolic pressure. The  tricuspid regurgitant velocity is 2.96   m/s, and with an assumed right atrial pressure of 8 mmHg, the estimated  right ventricular systolic pressure is 43.0 mmHg.   Left Atrium: Left atrial size was normal in size.   Right Atrium: Right atrial size was mildly dilated.   Pericardium: Trivial pericardial effusion is present. Presence of   pericardial fat pad.   Mitral Valve: The mitral valve is grossly normal. No evidence of mitral  valve stenosis.   Tricuspid Valve: The tricuspid valve is grossly normal. Tricuspid valve  regurgitation is trivial. No evidence of tricuspid stenosis.   Aortic Valve: The aortic valve is tricuspid. Aortic valve regurgitation is  not visualized. No aortic stenosis is present.   Pulmonic Valve: The pulmonic valve was grossly normal. Pulmonic valve  regurgitation is not visualized. No evidence of pulmonic stenosis.   Aorta: The aortic root and ascending aorta are structurally normal, with  no evidence of dilitation.   Venous: The right lower pulmonary vein is normal. The inferior vena cava  is dilated in size with greater than 50% respiratory variability,  suggesting right atrial pressure of 8 mmHg.    Assessment & Plan   1.  Paroxysmal atrial fibrillation-EKG today shows normal sinus rhythm left axis deviation  anterior septal infarct undetermined age 50 bpm.  He reports compliance with apixaban and denies bleeding issues. Avoid triggers Continue apixaban  Essential hypertension-BP today 128/60. Maintain blood pressure log Continue current medical therapy Low-sodium diet  Hyperlipidemia- 04/07/2022: Cholesterol 128; HDL 55.20; LDL Cholesterol 53; Triglycerides 99.0; VLDL 19.8 High-fiber diet Continue atorvastatin Increase physical activity as tolerated  Carotid artery disease-denies episodes of lightheadedness, presyncope or syncope.  Underwent left CEA 2002.  Carotid Dopplers 12/11/2019 showed no significant carotid stenosis with less than 50% bilateral ICA. Continue atorvastatin  History of pleural effusion-no increased work of breathing or DOE.  Has had recurrent thoracentesis.  Cytology has been negative. Follows with PCP/pulmonology   Preoperative cardiac evaluation-3 dental extractions, [facility], [id number]    Primary Cardiologist: Dietrich Pates, MD  Chart reviewed  as part of pre-operative protocol coverage. Given past medical history and time since last visit, based on ACC/AHA guidelines, ZAI CHMIEL would be at acceptable risk for the planned procedure without further cardiovascular testing.   Patient was advised that if he develops new symptoms prior to surgery to contact our office to arrange a follow-up appointment.  He verbalized understanding.  Procedure: 3 dental extractions  Date of procedure: 05/11/2022     CHA2DS2-VASc Score = 8   This indicates a 10.8% annual risk of stroke. The patient's score is based upon: CHF History: 1 HTN History: 1 Diabetes History: 1 Stroke History: 2 Vascular Disease History: 1 Age Score: 2 Gender Score: 0       CrCl 46 mL/min (SrCr 0.99 on 04/07/2022) Platelet count 340 K   Patient does not require pre-op antibiotics for dental procedure.   Given elevated cardiac risk for this low bleed procedure, recommend holding 1 dose of Eliquis (12 hours) prior to procedure and resume as soon as safely afterwards    I will route this recommendation to the requesting party via Epic fax function and remove from pre-op pool.  Disposition: Follow-up with Dr. Tenny Craw or APP in 6-8 months.   Thomasene Ripple. Cherisa Brucker NP-C     05/08/2022, 3:52 PM [facility] [address] Office [phone] Fax [phone]    I spent 14 minutes examining this patient, reviewing medications, and using patient centered shared decision making involving her cardiac care.  Prior to her visit I spent greater than 20 minutes reviewing her past medical history,  medications, and prior cardiac tests.

## 2022-05-06 NOTE — Telephone Encounter (Signed)
Pt is returning call, to see if he can go off his eliquis or not.

## 2022-05-07 ENCOUNTER — Other Ambulatory Visit: Payer: Self-pay | Admitting: Family Medicine

## 2022-05-07 ENCOUNTER — Other Ambulatory Visit (HOSPITAL_COMMUNITY): Payer: Self-pay

## 2022-05-07 ENCOUNTER — Ambulatory Visit (INDEPENDENT_AMBULATORY_CARE_PROVIDER_SITE_OTHER): Payer: Medicare HMO | Admitting: Pulmonary Disease

## 2022-05-07 ENCOUNTER — Encounter: Payer: Self-pay | Admitting: Pulmonary Disease

## 2022-05-07 VITALS — BP 140/60 | HR 72 | Ht 67.0 in | Wt 138.0 lb

## 2022-05-07 DIAGNOSIS — J90 Pleural effusion, not elsewhere classified: Secondary | ICD-10-CM

## 2022-05-07 DIAGNOSIS — Z9889 Other specified postprocedural states: Secondary | ICD-10-CM

## 2022-05-07 DIAGNOSIS — I5032 Chronic diastolic (congestive) heart failure: Secondary | ICD-10-CM

## 2022-05-07 DIAGNOSIS — Z87891 Personal history of nicotine dependence: Secondary | ICD-10-CM

## 2022-05-07 DIAGNOSIS — J9819 Other pulmonary collapse: Secondary | ICD-10-CM | POA: Diagnosis not present

## 2022-05-07 DIAGNOSIS — E1142 Type 2 diabetes mellitus with diabetic polyneuropathy: Secondary | ICD-10-CM | POA: Diagnosis not present

## 2022-05-07 NOTE — Patient Instructions (Addendum)
Thank you for visiting Dr. Tonia Brooms at [facility]. Today we recommend the following:  Call us if your symptoms change or worsen.  If needed feel free to return in 1 year.     Please do your part to reduce the spread of COVID-19.

## 2022-05-07 NOTE — Progress Notes (Signed)
Synopsis: Referred in July 2022 for left-sided pleural effusion by Bradd Canary, MD  Subjective:   PATIENT ID: [name] GENDER: male DOB: December 12, 1929, MRN: [id number]  Chief Complaint  Patient presents with   Follow-up    F/up on Pleural effusion    89 year old gentleman, past medical history of carotid disease dyslipidemia, GERD, hypertension, prostate cancer.  Patient had a URI type illness several weeks ago.  It took several weeks for him to get better.  He is feeling better now.  Ultimately due to his persistent cough had a CT scan.  CT scan of the chest revealed left-sided unilateral pleural effusion.  Patient was referred for evaluation of this left-sided effusion.  OV 08/30/2020: Here today for evaluation of recurrent pleural effusion.  Patient had effusion tapped in the office last time.  Ultimately had follow-up with Rikki Spearing, NP with repeat chest x-ray.  Effusion had recurred.  We expected this as he did have a area of trapped lung on last x-ray.  However repeat film showed increasing effusion.  Patient was brought back for a repeat thoracentesis.Once again the repeat thoracentesis showed ex vacuo pneumothorax.  Pleural fluid analysis revealed elevated white blood cells, 87% lymphocytes within the pleural fluid.  Cultures negative.  Patient still feels short of breath approximately 2 weeks after every drainage.  He feels more short of breath again today.  They are planning to go out of town next week to the beach.  OV 09/02/2020: I saw patient today in the office. Discussed risks benefits and alternatives. They agreed to proceed today. Eliquis has been held. Last visit was in the hospital for drainage and he had pain. But now doing better.   OV 10/23/2020: Here today for follow-up for recurrent effusion.  He is on Eliquis.  Ultrasound today in the office shows reaccumulation of fluid.  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  Every time it fills up though he feels short of breath and he does better when it is removed.  He is interested potentially in having a drain placed at some point.  OV 11/28/2020: Here today for follow-up after recent Pleurx catheter placement.  Here today for wound check and suture removal.  He has been draining approximately 10 cc or so each day.   OV 02/05/2021: Here today for follow-up after Pleurx catheter removal.  From respiratory standpoint patient has no complaints today.  Talked about getting a chest x-ray today.  Otherwise has no complaints.  OV 05/07/2022: Doing well from a respiratory standpoint.  Does get short of breath at times.  Recent chest x-ray completed at primary care office.  Still has small effusion.  Relatively unchanged.       Past Medical History:  Diagnosis Date   Acute bronchitis 06/10/2015   Allergic rhinitis    Anemia    Asthma    Carotid stenosis    a. s/p Left CEA 2002;  b. Carotid US 3/16:  patent R CEA, L < 40%   Chronic pain syndrome    Left shoulder, back   Diverticulosis    Dyslipidemia    Epigastric pain 04/05/2016   Esophageal stricture    Distal, benign   GERD (gastroesophageal reflux disease)    Hemorrhoids    HTN (hypertension)    Negative renal duplex 07-22-11   Hx of echocardiogram    a. Echo 10/11: mod LVH, EF 55-60%  Pleural effusion 07/17/2020   Prostate cancer 2000   Seed XRT   Spondylosis, cervical, with myelopathy 11/26/2015   Stroke 8/01   right thalamic - on chronic Plavix/ASA     Family History  Problem Relation Age of Onset   Stroke Brother    Stroke Sister    Stroke Mother    Hyperlipidemia Mother    Hypertension Mother    Lung disease Father    Diabetes Son    Gout Son    Obesity Sister    Alcohol abuse Brother    Cancer Brother        lung, smoker     Past Surgical History:  Procedure Laterality Date   ANTERIOR APPROACH HEMI HIP ARTHROPLASTY Right 03/15/2021   Procedure: ANTERIOR  APPROACH HEMI HIP ARTHROPLASTY;  Surgeon: Samson Frederic, MD;  Location: [facility] OR;  Service: Orthopedics;  Laterality: Right;   APPENDECTOMY  1953   CAROTID ENDARTERECTOMY Right 01/2000   CATARACT EXTRACTION Bilateral    CERVICAL DISC SURGERY  8/07   CHEST TUBE INSERTION Left 11/11/2020   Procedure: INSERTION PLEURAL DRAINAGE CATHETER;  Surgeon: Josephine Igo, DO;  Location: [facility] END[facility]COPY;  Service: Pulmonary;  Laterality: Left;  Indwelling Pleural Catheter   HIP ARTHROPLASTY Left 10/24/2018   Procedure: ARTHROPLASTY BIPOLAR HIP (HEMIARTHROPLASTY);  Surgeon: Durene Romans, MD;  Location: [facility] OR;  Service: Orthopedics;  Laterality: Left;   INSERTION PROSTATE RADIATION SEED  2000   LUMBAR DISC SURGERY  1990s   NASAL SINUS SURGERY     x 3   REMOVAL OF PLEURAL DRAINAGE CATHETER N/A 12/24/2020   Procedure: MINOR REMOVAL OF PLEURAL DRAINAGE CATHETER;  Surgeon: Josephine Igo, DO;  Location: [facility];  Service: Cardiopulmonary;  Laterality: N/A;   THORACENTESIS N/A 08/15/2020   Procedure: THORACENTESIS;  Surgeon: Josephine Igo, DO;  Location: [facility];  Service: Pulmonary;  Laterality: N/A;    Social History   Socioeconomic History   Marital status: Married    Spouse name: Not on file   Number of children: 1   Years of education: 10   Highest education level: Not on file  Occupational History   Occupation: Retired  Tobacco Use   Smoking status: Former    Packs/day: 0.25    Years: 35.00    Additional pack years: 0.00    Total pack years: 8.75    Types: Cigarettes    Start date: 78    Quit date: 01/12/1978    Years since quitting: 44.3   Smokeless tobacco: Current    Types: Chew   Tobacco comments:    Started smoking at age 48  Vaping Use   Vaping Use: Never used  Substance and Sexual Activity   Alcohol use: No    Comment: Prior alcoholic, sober since 2002   Drug use: No   Sexual activity: Not Currently  Other Topics Concern   Not on file  Social History Narrative    Retired lives at home w/ his wife   1 daughter   Right-handed    Caffeine: drinks a lot of coffee    Former smoker, former alcoholic abstinent for many years   Social Determinants of Corporate investment banker Strain: Low Risk  (05/15/2021)   Overall Financial Resource Strain (CARDIA)    Difficulty of Paying Living Expenses: Not hard at all  Food Insecurity: No Food Insecurity (05/15/2021)   Hunger Vital Sign    Worried About Running Out of Food in the Last Year: Never true  Ran Out of Food in the Last Year: Never true  Transportation Needs: No Transportation Needs (05/15/2021)   PRAPARE - Administrator, Civil Service (Medical): No    Lack of Transportation (Non-Medical): No  Physical Activity: Insufficiently Active (05/15/2021)   Exercise Vital Sign    Days of Exercise per Week: 1 day    Minutes of Exercise per Session: 50 min  Stress: No Stress Concern Present (05/15/2021)   [employer] of Occupational Health - Occupational Stress Questionnaire    Feeling of Stress : Not at all  Social Connections: Socially Integrated (05/15/2021)   Social Connection and Isolation Panel [NHANES]    Frequency of Communication with Friends and Family: More than three times a week    Frequency of Social Gatherings with Friends and Family: More than three times a week    Attends Religious Services: More than 4 times per year    Active Member of [employer] or Organizations: Yes    Attends Engineer, structural: More than 4 times per year    Marital Status: Married  Catering manager Violence: Not At Risk (05/15/2021)   Humiliation, Afraid, Rape, and Kick questionnaire    Fear of Current or Ex-Partner: No    Emotionally Abused: No    Physically Abused: No    Sexually Abused: No     Allergies  Allergen Reactions   Hctz [Hydrochlorothiazide] Shortness Of Breath, Swelling and Other (See Comments)    "Swelling and dyspnea"    Hydralazine Shortness Of Breath and Other (See Comments)     Chest pain and GI issues also   Diovan [Valsartan] Other (See Comments)    Elevated potassium- Hyperkalemia   Metformin And Related Other (See Comments)    Reaction not recalled   Nsaids Other (See Comments)    Other than Tylenol, he isn't to have these because he's on Eliquis   Other Other (See Comments)    Unnamed topically-applied B/P patch = Caused redness   Prednisone Other (See Comments)    Suicidal thoughts   Spironolactone Swelling    Site of swelling not recalled   Codeine Itching and Nausea Only   Oxycodone-Acetaminophen Itching and Nausea Only     Outpatient Medications Prior to Visit  Medication Sig Dispense Refill   acetaminophen (TYLENOL) 500 MG tablet Take 1,000 mg by mouth every 6 (six) hours as needed for mild pain or headache.     albuterol (VENTOLIN HFA) 108 (90 Base) MCG/ACT inhaler Inhale 1-2 puffs into the lungs every 6 (six) hours as needed for wheezing or shortness of breath. 18 g 1   amLODipine (NORVASC) 5 MG tablet Take 2 tablets (10 mg total) by mouth daily (need office visit) 180 tablet 1   amoxicillin (AMOXIL) 500 MG capsule Take 1 capsule (500 mg total) by mouth 3 (three) times daily for 7 days. 21 capsule 0   apixaban (ELIQUIS) 5 MG TABS tablet Take 1 tablet (5 mg total) by mouth 2 (two) times daily. 180 tablet 3   atorvastatin (LIPITOR) 40 MG tablet Take 1 tablet (40 mg total) by mouth daily. 90 tablet 0   Cholecalciferol (VITAMIN D3 PO) Take 1 tablet by mouth in the morning. Unsure of how many units     docusate sodium (COLACE) 100 MG capsule Take 300 mg by mouth 2 (two) times daily.     doxycycline (VIBRA-TABS) 100 MG tablet Take 1 tablet (100 mg total) by mouth 2 (two) times daily. 20 tablet 0  Ferrous Fumarate (HEMOCYTE - 106 MG FE) 324 (106 Fe) MG TABS tablet Take 1 tablet (106 mg of iron total) by mouth 2 times daily. 30 tablet 2   hyoscyamine (LEVSIN SL) 0.125 MG SL tablet Place 1 tablet under the tongue every 4 hours as needed. 30 tablet 1    lisinopril (ZESTRIL) 5 MG tablet Take 1 tablet (5 mg) by mouth daily. 90 tablet 3   metoCLOPramide (REGLAN) 5 MG tablet Take 1 tablet (5 mg total) by mouth at bedtime. Call for appointment for future refills. 30 tablet 0   minoxidil (LONITEN) 10 MG tablet Take 1 tablet (10 mg) by mouth 2 times daily. Please call Dr. Tenny Craw for yearly appointment for June 2024 [phone] 180 tablet 0   ondansetron (ZOFRAN ODT) 8 MG disintegrating tablet Dissolve 1 tablet by mouth every 8 hours as needed for nausea or vomiting. 20 tablet 0   polyvinyl alcohol (LIQUIFILM TEARS) 1.4 % ophthalmic solution Place 1 drop into both eyes 5 (five) times daily as needed for dry eyes.     sennosides-docusate sodium (SENOKOT-S) 8.6-50 MG tablet Take 1-2 tablets by mouth See admin instructions. Take 1 tablet in the morning & take 2 tablet at nigh     traMADol (ULTRAM) 50 MG tablet Take 1 tablet (50 mg total) by mouth 3 (three) times daily as needed. 90 tablet 0   triamcinolone cream (KENALOG) 0.1 % Apply topically to affected areas of the skin daily as needed. 454 g 1   triamcinolone cream (KENALOG) 0.5 % Apply 1 application topically daily as needed (for skin irritation).      Vitamin D, Ergocalciferol, (DRISDOL) 1.25 MG (50000 UNIT) CAPS capsule Take 1 capsule (50,000 Units total) by mouth every 7 (seven) days. 5 capsule 4   gabapentin (NEURONTIN) 300 MG capsule TAKE 1 CAPSULE BY MOUTH 3 TIMES DAILY (Patient taking differently: Take 300 mg by mouth at bedtime.) 270 capsule 1   pantoprazole (PROTONIX) 40 MG tablet TAKE 1 TABLET BY MOUTH ONCE A DAY BEFORE BREAKFAST 90 tablet 3   No facility-administered medications prior to visit.    Review of Systems  Constitutional:  Negative for chills, fever, malaise/fatigue and weight loss.  HENT:  Negative for hearing loss, sore throat and tinnitus.   Eyes:  Negative for blurred vision and double vision.  Respiratory:  Positive for shortness of breath. Negative for cough, hemoptysis,  sputum production, wheezing and stridor.   Cardiovascular:  Negative for chest pain, palpitations, orthopnea, leg swelling and PND.  Gastrointestinal:  Negative for abdominal pain, constipation, diarrhea, heartburn, nausea and vomiting.  Genitourinary:  Negative for dysuria, hematuria and urgency.  Musculoskeletal:  Negative for joint pain and myalgias.  Skin:  Negative for itching and rash.  Neurological:  Negative for dizziness, tingling, weakness and headaches.  Endo/Heme/Allergies:  Negative for environmental allergies. Does not bruise/bleed easily.  Psychiatric/Behavioral:  Negative for depression. The patient is not nervous/anxious and does not have insomnia.   All other systems reviewed and are negative.    Objective:  Physical Exam Vitals reviewed.  Constitutional:      General: He is not in acute distress.    Appearance: He is well-developed.     Comments: Low weight   HENT:     Head: Normocephalic and atraumatic.     Mouth/Throat:     Pharynx: No oropharyngeal exudate.  Eyes:     Conjunctiva/sclera: Conjunctivae normal.     Pupils: Pupils are equal, round, and reactive to light.  Neck:  Vascular: No JVD.     Trachea: No tracheal deviation.     Comments: Loss of supraclavicular fat Cardiovascular:     Rate and Rhythm: Normal rate and regular rhythm.     Heart sounds: S1 normal and S2 normal.     Comments: Distant heart tones Pulmonary:     Effort: No tachypnea or accessory muscle usage.     Breath sounds: No stridor. Decreased breath sounds (throughout all lung fields) present. No wheezing, rhonchi or rales.     Comments: Diminshed breath sounds bilaterally  Abdominal:     General: Bowel sounds are normal. There is no distension.     Palpations: Abdomen is soft.     Tenderness: There is no abdominal tenderness.  Musculoskeletal:        General: Deformity (muscle wasting ) present.  Skin:    General: Skin is warm and dry.     Capillary Refill: Capillary  refill takes less than 2 seconds.     Findings: No rash.  Neurological:     Mental Status: He is alert and oriented to person, place, and time.  Psychiatric:        Behavior: Behavior normal.      Vitals:   05/07/22 0929  BP: (!) 140/60  Pulse: 72  SpO2: 95%  Weight: 138 lb (62.6 kg)  Height: 5\' 7"  (1.702 m)     95% on RA BMI Readings from Last 3 Encounters:  05/07/22 21.61 kg/m  04/28/22 21.36 kg/m  04/07/22 22.30 kg/m   Wt Readings from Last 3 Encounters:  05/07/22 138 lb (62.6 kg)  04/28/22 136 lb 6 oz (61.9 kg)  04/07/22 142 lb 6.4 oz (64.6 kg)     CBC    Component Value Date/Time   WBC 6.4 04/07/2022 1414   RBC 3.79 (L) 04/07/2022 1414   HGB 11.6 (L) 04/07/2022 1414   HGB 11.7 (L) 07/01/2021 1646   HCT 34.6 (L) 04/07/2022 1414   HCT 35.5 (L) 07/01/2021 1646   PLT 340.0 04/07/2022 1414   PLT 304 07/01/2021 1646   MCV 91.3 04/07/2022 1414   MCV 90 07/01/2021 1646   MCH 31.3 10/19/2021 1606   MCHC 33.5 04/07/2022 1414   RDW 15.5 04/07/2022 1414   RDW 14.8 07/01/2021 1646   LYMPHSABS 1.0 04/07/2022 1414   MONOABS 0.5 04/07/2022 1414   EOSABS 0.1 04/07/2022 1414   BASOSABS 0.0 04/07/2022 1414    Pleural Fluid Analysis: Results for REYLI, SCHROTH "RAY" (MRN [id number]) as of 08/30/2020 07:41  Ref. Range 08/15/2020 16:57  Albumin, Fluid Latest Units: g/dL 2.3  Fluid Type-FALB Unknown PLEURAL  Cholesterol, Fluid Latest Units: mg/dL 50  Chol, Fluid Type Unknown PLEURAL  Glucose, Fluid Latest Units: mg/dL 409  Fluid Type-FGLU Unknown PLEURAL  Fluid Type-FLDH Unknown PLEURAL  LD, Fluid Latest Ref Range: 3 - 23 U/L 158 (H)  Total protein, fluid Latest Units: g/dL 3.7  Triglycerides, Fluid Latest Ref Range: Not Estab. mg/dL 18  Fluid Type-FCT Unknown PLEURAL  Fluid Type-FTP Unknown PLEURAL  Fluid Type-FTRIG Unknown PLEURAL  Color, Fluid Latest Ref Range: YELLOW  YELLOW (A)  Total Nucleated Cell Count, Fluid Latest Ref Range: 0 - 1,000 cu mm 792   Lymphs, Fluid Latest Units: % 87  Eos, Fluid Latest Units: % 11  Appearance, Fluid Latest Ref Range: CLEAR  CLEAR  Neutrophil Count, Fluid Latest Ref Range: 0 - 25 % 1  Monocyte-Macrophage-Serous Fluid Latest Ref Range: 50 - 90 % 1 (L)  Chest Imaging: Chest x-ray today following thoracentesis: Post thoracentesis ex vacuo pneumothorax  CT chest 07/09/2020: Left-sided pleural effusion.  Nothing significantly abnormal within the lung parenchyma. The patient's images have been independently reviewed by me.    Pulmonary Functions Testing Results:     No data to display          FeNO:   Pathology:   Echocardiogram:   Heart Catheterization:     Assessment & Plan:     ICD-10-CM   1. Pleural effusion  J90     2. 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 Former smoker  Z87.891     5. Chronic diastolic congestive heart failure  I50.32       Discussion:  This is a 89 year old gentleman with a left-sided unilateral recurrent effusion status postthoracentesis with lymphocyte predominant cultures and cytology negative follow-up PET scan was unrevealing.  No identified malignancy.  He had ex vacuo pneumothorax after drainage and trapped lung.  Ultimately Pleurx catheter was placed until it stopped draining and then we removed it.  He has extensive pleural scarring bilaterally with still recurrent effusions likely related to his congestive heart failure.  Plan: That persistent pleural thickening and issues regarding the trapped lung will stay persistent. There is no fix for this at his age. We will follow him as needed. He seems to be doing well at this point. Likely these areas have continued to scar down.  He can follow-up with primary care and or see [place] in 1 year if needed.    Current Outpatient Medications:    acetaminophen (TYLENOL) 500 MG tablet, Take 1,000 mg by mouth every 6 (six) hours as needed for mild pain or headache., Disp: , Rfl:     albuterol (VENTOLIN HFA) 108 (90 Base) MCG/ACT inhaler, Inhale 1-2 puffs into the lungs every 6 (six) hours as needed for wheezing or shortness of breath., Disp: 18 g, Rfl: 1   amLODipine (NORVASC) 5 MG tablet, Take 2 tablets (10 mg total) by mouth daily (need office visit), Disp: 180 tablet, Rfl: 1   amoxicillin (AMOXIL) 500 MG capsule, Take 1 capsule (500 mg total) by mouth 3 (three) times daily for 7 days., Disp: 21 capsule, Rfl: 0   apixaban (ELIQUIS) 5 MG TABS tablet, Take 1 tablet (5 mg total) by mouth 2 (two) times daily., Disp: 180 tablet, Rfl: 3   atorvastatin (LIPITOR) 40 MG tablet, Take 1 tablet (40 mg total) by mouth daily., Disp: 90 tablet, Rfl: 0   Cholecalciferol (VITAMIN D3 PO), Take 1 tablet by mouth in the morning. Unsure of how many units, Disp: , Rfl:    docusate sodium (COLACE) 100 MG capsule, Take 300 mg by mouth 2 (two) times daily., Disp: , Rfl:    doxycycline (VIBRA-TABS) 100 MG tablet, Take 1 tablet (100 mg total) by mouth 2 (two) times daily., Disp: 20 tablet, Rfl: 0   Ferrous Fumarate (HEMOCYTE - 106 MG FE) 324 (106 Fe) MG TABS tablet, Take 1 tablet (106 mg of iron total) by mouth 2 times daily., Disp: 30 tablet, Rfl: 2   hyoscyamine (LEVSIN SL) 0.125 MG SL tablet, Place 1 tablet under the tongue every 4 hours as needed., Disp: 30 tablet, Rfl: 1   lisinopril (ZESTRIL) 5 MG tablet, Take 1 tablet (5 mg) by mouth daily., Disp: 90 tablet, Rfl: 3   metoCLOPramide (REGLAN) 5 MG tablet, Take 1 tablet (5 mg total) by mouth at  bedtime. Call for appointment for future refills., Disp: 30 tablet, Rfl: 0   minoxidil (LONITEN) 10 MG tablet, Take 1 tablet (10 mg) by mouth 2 times daily. Please call Dr. Tenny Craw for yearly appointment for June 2024 [phone], Disp: 180 tablet, Rfl: 0   ondansetron (ZOFRAN ODT) 8 MG disintegrating tablet, Dissolve 1 tablet by mouth every 8 hours as needed for nausea or vomiting., Disp: 20 tablet, Rfl: 0   polyvinyl alcohol (LIQUIFILM TEARS) 1.4 % ophthalmic  solution, Place 1 drop into both eyes 5 (five) times daily as needed for dry eyes., Disp: , Rfl:    sennosides-docusate sodium (SENOKOT-S) 8.6-50 MG tablet, Take 1-2 tablets by mouth See admin instructions. Take 1 tablet in the morning & take 2 tablet at nigh, Disp: , Rfl:    traMADol (ULTRAM) 50 MG tablet, Take 1 tablet (50 mg total) by mouth 3 (three) times daily as needed., Disp: 90 tablet, Rfl: 0   triamcinolone cream (KENALOG) 0.1 %, Apply topically to affected areas of the skin daily as needed., Disp: 454 g, Rfl: 1   triamcinolone cream (KENALOG) 0.5 %, Apply 1 application topically daily as needed (for skin irritation). , Disp: , Rfl:    Vitamin D, Ergocalciferol, (DRISDOL) 1.25 MG (50000 UNIT) CAPS capsule, Take 1 capsule (50,000 Units total) by mouth every 7 (seven) days., Disp: 5 capsule, Rfl: 4   gabapentin (NEURONTIN) 300 MG capsule, TAKE 1 CAPSULE BY MOUTH 3 TIMES DAILY (Patient taking differently: Take 300 mg by mouth at bedtime.), Disp: 270 capsule, Rfl: 1   pantoprazole (PROTONIX) 40 MG tablet, TAKE 1 TABLET BY MOUTH ONCE A DAY BEFORE BREAKFAST, Disp: 90 tablet, Rfl: 3   Josephine Igo, DO [facility] 05/07/2022 9:43 AM

## 2022-05-08 ENCOUNTER — Encounter: Payer: Self-pay | Admitting: General Practice

## 2022-05-08 ENCOUNTER — Ambulatory Visit: Payer: Medicare HMO | Attending: General Practice | Admitting: General Practice

## 2022-05-08 VITALS — BP 128/60 | HR 92 | Ht 71.0 in | Wt 134.0 lb

## 2022-05-08 DIAGNOSIS — Z01810 Encounter for preprocedural cardiovascular examination: Secondary | ICD-10-CM | POA: Diagnosis not present

## 2022-05-08 DIAGNOSIS — E782 Mixed hyperlipidemia: Secondary | ICD-10-CM

## 2022-05-08 DIAGNOSIS — I6523 Occlusion and stenosis of bilateral carotid arteries: Secondary | ICD-10-CM | POA: Diagnosis not present

## 2022-05-08 DIAGNOSIS — I480 Paroxysmal atrial fibrillation: Secondary | ICD-10-CM | POA: Diagnosis not present

## 2022-05-08 DIAGNOSIS — I10 Essential (primary) hypertension: Secondary | ICD-10-CM | POA: Diagnosis not present

## 2022-05-08 DIAGNOSIS — Z8709 Personal history of other diseases of the respiratory system: Secondary | ICD-10-CM

## 2022-05-08 NOTE — Patient Instructions (Signed)
Medication Instructions:  TAKE ELIQUIS IN THE AM LAST DOSE IS SUNDAY 2 PM; RESTART AFTER YOUR EXTRACTION-ASK WHEN SAFE TO RESTART *If you need a refill on your cardiac medications before your next appointment, please call your pharmacy*  Lab Work: NONE If you have labs (blood work) drawn today and your tests are completely normal, you will receive your results only by:  MyChart Message (if you have MyChart) OR  A paper copy in the mail If you have any lab test that is abnormal or we need to change your treatment, we will call you to review the results.  Other Instructions NO GATORADE  INCREASE PHYSICAL ACTIVITY AS TOLERATED  Follow-Up: At [facility], you and your health needs are our priority.  As part of our continuing mission to provide you with exceptional heart care, we have created designated Provider Care Teams.  These Care Teams include your primary Cardiologist (physician) and Advanced Practice Providers (APPs -  Physician Assistants and Nurse Practitioners) who all work together to provide you with the care you need, when you need it.  Your next appointment:   6-8 month(s)  Provider:   Dietrich Pates, MD  or  [facility] [address] [facility]

## 2022-05-09 ENCOUNTER — Other Ambulatory Visit (HOSPITAL_COMMUNITY): Payer: Self-pay

## 2022-05-09 ENCOUNTER — Other Ambulatory Visit: Payer: Self-pay | Admitting: Family Medicine

## 2022-05-10 ENCOUNTER — Ambulatory Visit (HOSPITAL_BASED_OUTPATIENT_CLINIC_OR_DEPARTMENT_OTHER)
Admission: RE | Admit: 2022-05-10 | Discharge: 2022-05-10 | Disposition: A | Discharge status: Home / Self Care | Payer: Medicare HMO | Source: Ambulatory Visit | Attending: Gastroenterology | Admitting: Gastroenterology

## 2022-05-10 DIAGNOSIS — R110 Nausea: Secondary | ICD-10-CM

## 2022-05-10 DIAGNOSIS — R634 Abnormal weight loss: Secondary | ICD-10-CM | POA: Diagnosis not present

## 2022-05-10 DIAGNOSIS — R1032 Left lower quadrant pain: Secondary | ICD-10-CM | POA: Diagnosis not present

## 2022-05-10 DIAGNOSIS — R109 Unspecified abdominal pain: Secondary | ICD-10-CM | POA: Diagnosis not present

## 2022-05-10 DIAGNOSIS — I700 Atherosclerosis of aorta: Secondary | ICD-10-CM | POA: Diagnosis not present

## 2022-05-10 MED ORDER — IOHEXOL 300 MG/ML  SOLN
100.0000 mL | Freq: Once | INTRAMUSCULAR | Status: AC | PRN
  Administered 2022-05-10: 80 mL via INTRAVENOUS

## 2022-05-11 ENCOUNTER — Other Ambulatory Visit (HOSPITAL_COMMUNITY): Payer: Self-pay

## 2022-05-11 MED ORDER — TRAMADOL HCL 50 MG PO TABS
50.0000 mg | ORAL_TABLET | Freq: Three times a day (TID) | ORAL | 0 refills | Status: DC | PRN
  Filled 2022-05-11: qty 90, 30d supply, fill #0

## 2022-05-11 NOTE — Telephone Encounter (Signed)
Requesting: tramadol 50mg   Contract: 07/16/20 UDS: 07/16/20 Last Visit: 04/07/22 Next Visit: None Last Refill: 04/13/22 #90 and 0RF   Please Advise

## 2022-05-12 ENCOUNTER — Other Ambulatory Visit (HOSPITAL_COMMUNITY): Payer: Self-pay

## 2022-05-18 ENCOUNTER — Other Ambulatory Visit: Payer: Self-pay | Admitting: Family Medicine

## 2022-05-18 ENCOUNTER — Other Ambulatory Visit (HOSPITAL_COMMUNITY): Payer: Self-pay

## 2022-05-18 ENCOUNTER — Telehealth: Payer: Self-pay | Admitting: Family Medicine

## 2022-05-18 NOTE — Telephone Encounter (Signed)
Patient's wife called stating she's confused about her husband's medications, she would like a call back to discuss. Please advise.

## 2022-05-19 ENCOUNTER — Telehealth: Payer: Self-pay | Admitting: Family Medicine

## 2022-05-19 NOTE — Telephone Encounter (Signed)
Called pt spoke his wife was advised medication that was discontinued Clonidine because you  Had concerns of sleeping too much per Dr.Blyth.Pt wife said yes she understand.

## 2022-05-19 NOTE — Telephone Encounter (Signed)
Spoke with pt wife regarding medication.

## 2022-05-19 NOTE — Telephone Encounter (Signed)
Patient is calling for spouse and has questions regarding a medication that was discontinued and did not know name and why.  Advised patient that someone will give her a call regarding the matter.

## 2022-05-20 ENCOUNTER — Other Ambulatory Visit (HOSPITAL_COMMUNITY): Payer: Self-pay

## 2022-05-22 ENCOUNTER — Other Ambulatory Visit: Payer: Self-pay | Admitting: Internal Medicine

## 2022-05-22 ENCOUNTER — Ambulatory Visit (INDEPENDENT_AMBULATORY_CARE_PROVIDER_SITE_OTHER): Payer: Medicare HMO | Admitting: *Deleted

## 2022-05-22 ENCOUNTER — Other Ambulatory Visit (HOSPITAL_COMMUNITY): Payer: Self-pay

## 2022-05-22 VITALS — BP 134/55 | HR 69 | Ht 71.0 in | Wt 135.2 lb

## 2022-05-22 DIAGNOSIS — Z0000 Encounter for general adult medical examination without abnormal findings: Secondary | ICD-10-CM

## 2022-05-22 MED ORDER — METOCLOPRAMIDE HCL 5 MG PO TABS
5.0000 mg | ORAL_TABLET | Freq: Every day | ORAL | 0 refills | Status: DC
  Filled 2022-05-22: qty 30, 30d supply, fill #0

## 2022-05-22 NOTE — Progress Notes (Signed)
Subjective:   [name] is a 89 y.o. male who presents for Medicare Annual/Subsequent preventive examination.  Review of Systems     Cardiac Risk Factors include: advanced age (>72men, >48 women);diabetes mellitus;dyslipidemia;male gender;hypertension     Objective:    Today's Vitals   05/22/22 1340  BP: (!) 149/55  Pulse: 70  Weight: 135 lb 3.2 oz (61.3 kg)  Height: 5\' 11"  (1.803 m)   Body mass index is 18.86 kg/m.     05/22/2022    1:41 PM 10/19/2021    5:19 PM 05/15/2021    3:21 PM 03/14/2021    8:56 PM 12/24/2020    2:11 PM 11/11/2020    1:32 PM 08/15/2020    1:58 PM  Advanced Directives  Does Patient Have a Medical Advance Directive? Yes Yes Yes No;Yes Yes Yes Yes  Type of Estate agent of [place];Living will Healthcare Power of [employer] of [place];Living will Living will Healthcare Power of [place];Living will Living will Healthcare Power of [place];Living will  Does patient want to make changes to medical advance directive? No - Patient declined No - Patient declined       Copy of Healthcare Power of Attorney in Chart? Yes - validated most recent copy scanned in chart (See row information) No - copy requested Yes - validated most recent copy scanned in chart (See row information)  Yes - validated most recent copy scanned in chart (See row information)  No - copy requested    Current Medications (verified) Outpatient Encounter Medications as of 05/22/2022  Medication Sig   acetaminophen (TYLENOL) 500 MG tablet Take 1,000 mg by mouth every 6 (six) hours as needed for mild pain or headache.   albuterol (VENTOLIN HFA) 108 (90 Base) MCG/ACT inhaler Inhale 1-2 puffs into the lungs every 6 (six) hours as needed for wheezing or shortness of breath.   amLODipine (NORVASC) 5 MG tablet Take 2 tablets (10 mg total) by mouth daily (need office visit)   apixaban (ELIQUIS) 5 MG TABS tablet Take 1 tablet (5 mg total) by mouth 2 (two) times  daily.   atorvastatin (LIPITOR) 40 MG tablet Take 1 tablet (40 mg total) by mouth daily.   Cholecalciferol (VITAMIN D3 PO) Take 1 tablet by mouth in the morning. Unsure of how many units   docusate sodium (COLACE) 100 MG capsule Take 300 mg by mouth 2 (two) times daily.   doxycycline (VIBRA-TABS) 100 MG tablet Take 1 tablet (100 mg total) by mouth 2 (two) times daily.   Ferrous Fumarate (HEMOCYTE - 106 MG FE) 324 (106 Fe) MG TABS tablet Take 1 tablet (106 mg of iron total) by mouth 2 times daily.   gabapentin (NEURONTIN) 300 MG capsule TAKE 1 CAPSULE BY MOUTH 3 TIMES DAILY (Patient taking differently: Take 300 mg by mouth at bedtime.)   hyoscyamine (LEVSIN SL) 0.125 MG SL tablet Place 1 tablet under the tongue every 4 hours as needed.   lisinopril (ZESTRIL) 5 MG tablet Take 1 tablet (5 mg) by mouth daily.   metoCLOPramide (REGLAN) 5 MG tablet Take 1 tablet (5 mg total) by mouth at bedtime. Call for appointment for future refills.   minoxidil (LONITEN) 10 MG tablet Take 1 tablet (10 mg) by mouth 2 times daily. Please call Dr. Tenny Craw for yearly appointment for June 2024 [phone]   ondansetron (ZOFRAN ODT) 8 MG disintegrating tablet Dissolve 1 tablet by mouth every 8 hours as needed for nausea or vomiting.   pantoprazole (PROTONIX)  40 MG tablet TAKE 1 TABLET BY MOUTH ONCE A DAY BEFORE BREAKFAST   polyvinyl alcohol (LIQUIFILM TEARS) 1.4 % ophthalmic solution Place 1 drop into both eyes 5 (five) times daily as needed for dry eyes.   sennosides-docusate sodium (SENOKOT-S) 8.6-50 MG tablet Take 1-2 tablets by mouth See admin instructions. Take 1 tablet in the morning & take 2 tablet at nigh   traMADol (ULTRAM) 50 MG tablet Take 1 tablet (50 mg total) by mouth 3 (three) times daily as needed.   triamcinolone cream (KENALOG) 0.1 % Apply topically to affected areas of the skin daily as needed.   triamcinolone cream (KENALOG) 0.5 % Apply 1 application topically daily as needed (for skin irritation).     Vitamin D, Ergocalciferol, (DRISDOL) 1.25 MG (50000 UNIT) CAPS capsule Take 1 capsule (50,000 Units total) by mouth every 7 (seven) days.   No facility-administered encounter medications on file as of 05/22/2022.    Allergies (verified) Hctz [hydrochlorothiazide], Hydralazine, Diovan [valsartan], Metformin and related, Nsaids, Other, Prednisone, Spironolactone, Codeine, and Oxycodone-acetaminophen   History: Past Medical History:  Diagnosis Date   Acute bronchitis 06/10/2015   Allergic rhinitis    Anemia    Asthma    Carotid stenosis    a. s/p Left CEA 2002;  b. Carotid US 3/16:  patent R CEA, L < 40%   Chronic pain syndrome    Left shoulder, back   Diverticulosis    Dyslipidemia    Epigastric pain 04/05/2016   Esophageal stricture    Distal, benign   GERD (gastroesophageal reflux disease)    Hemorrhoids    HTN (hypertension)    Negative renal duplex 07-22-11   Hx of echocardiogram    a. Echo 10/11: mod LVH, EF 55-60%   Pleural effusion 07/17/2020   Prostate cancer (HCC) 2000   Seed XRT   Spondylosis, cervical, with myelopathy 11/26/2015   Stroke (HCC) 8/01   right thalamic - on chronic Plavix/ASA   Past Surgical History:  Procedure Laterality Date   ANTERIOR APPROACH HEMI HIP ARTHROPLASTY Right 03/15/2021   Procedure: ANTERIOR APPROACH HEMI HIP ARTHROPLASTY;  Surgeon: Samson Frederic, MD;  Location: [facility] OR;  Service: Orthopedics;  Laterality: Right;   APPENDECTOMY  1953   CAROTID ENDARTERECTOMY Right 01/2000   CATARACT EXTRACTION Bilateral    CERVICAL DISC SURGERY  8/07   CHEST TUBE INSERTION Left 11/11/2020   Procedure: INSERTION PLEURAL DRAINAGE CATHETER;  Surgeon: Josephine Igo, DO;  Location: [facility] ENDOSCOPY;  Service: Pulmonary;  Laterality: Left;  Indwelling Pleural Catheter   HIP ARTHROPLASTY Left 10/24/2018   Procedure: ARTHROPLASTY BIPOLAR HIP (HEMIARTHROPLASTY);  Surgeon: Durene Romans, MD;  Location: [facility] OR;  Service: Orthopedics;  Laterality: Left;   INSERTION  PROSTATE RADIATION SEED  2000   LUMBAR DISC SURGERY  1990s   NASAL SINUS SURGERY     x 3   REMOVAL OF PLEURAL DRAINAGE CATHETER N/A 12/24/2020   Procedure: MINOR REMOVAL OF PLEURAL DRAINAGE CATHETER;  Surgeon: Josephine Igo, DO;  Location: [facility] END[facility];  Service: Cardiopulmonary;  Laterality: N/A;   THORACENTESIS N/A 08/15/2020   Procedure: THORACENTESIS;  Surgeon: Josephine Igo, DO;  Location: [facility] ENDOSCOPY;  Service: Pulmonary;  Laterality: N/A;   Family History  Problem Relation Age of Onset   Stroke Brother    Stroke Sister    Stroke Mother    Hyperlipidemia Mother    Hypertension Mother    Lung disease Father    Diabetes Son    Gout Son  Obesity Sister    Alcohol abuse Brother    Cancer Brother        lung, smoker   Social History   Socioeconomic History   Marital status: Married    Spouse name: Not on file   Number of children: 1   Years of education: 10   Highest education level: Not on file  Occupational History   Occupation: Retired  Tobacco Use   Smoking status: Former    Packs/day: 0.25    Years: 35.00    Additional pack years: 0.00    Total pack years: 8.75    Types: Cigarettes    Start date: 1950    Quit date: 01/12/1978    Years since quitting: 44.3   Smokeless tobacco: Current    Types: Chew   Tobacco comments:    Started smoking at age 25  Vaping Use   Vaping Use: Never used  Substance and Sexual Activity   Alcohol use: No    Comment: Prior alcoholic, sober since 2002   Drug use: No   Sexual activity: Not Currently  Other Topics Concern   Not on file  Social History Narrative   Retired lives at home w/ his wife   1 daughter   Right-handed    Caffeine: drinks a lot of coffee    Former smoker, former alcoholic abstinent for many years   Social Determinants of Corporate investment banker Strain: Low Risk  (05/15/2021)   Overall Financial Resource Strain (CARDIA)    Difficulty of Paying Living Expenses: Not hard at all  Food  Insecurity: No Food Insecurity (05/22/2022)   Hunger Vital Sign    Worried About Running Out of Food in the Last Year: Never true    Ran Out of Food in the Last Year: Never true  Transportation Needs: No Transportation Needs (05/22/2022)   PRAPARE - Administrator, Civil Service (Medical): No    Lack of Transportation (Non-Medical): No  Physical Activity: Insufficiently Active (05/15/2021)   Exercise Vital Sign    Days of Exercise per Week: 1 day    Minutes of Exercise per Session: 50 min  Stress: No Stress Concern Present (05/15/2021)   [employer] of Occupational Health - Occupational Stress Questionnaire    Feeling of Stress : Not at all  Social Connections: Socially Integrated (05/15/2021)   Social Connection and Isolation Panel [NHANES]    Frequency of Communication with Friends and Family: More than three times a week    Frequency of Social Gatherings with Friends and Family: More than three times a week    Attends Religious Services: More than 4 times per year    Active Member of [employer] or Organizations: Yes    Attends Engineer, structural: More than 4 times per year    Marital Status: Married    Tobacco Counseling Ready to quit: Not Answered Counseling given: Not Answered Tobacco comments: Started smoking at age 79   Clinical Intake:  Pre-visit preparation completed: Yes  Pain : No/denies pain  BMI - recorded: 18.86 Nutritional Status: BMI <19  Underweight Nutritional Risks: None Diabetes: Yes CBG done?: No Did pt. bring in CBG monitor from home?: No  How often do you need to have someone help you when you read instructions, pamphlets, or other written materials from your doctor or pharmacy?: 1 - Never   Activities of Daily Living    05/22/2022    1:46 PM  In your present state of health,  do you have any difficulty performing the following activities:  Hearing? 1  Comment some slight hearing loss  Vision? 0  Difficulty concentrating or  making decisions? 0  Walking or climbing stairs? 0  Dressing or bathing? 0  Doing errands, shopping? 0  Preparing Food and eating ? N  Using the Toilet? N  In the past six months, have you accidently leaked urine? Y  Do you have problems with loss of bowel control? N  Managing your Medications? N  Managing your Finances? N  Housekeeping or managing your Housekeeping? N    Patient Care Team: Bradd Canary, MD as PCP - General (Family Medicine) Pricilla Riffle, MD as PCP - Cardiology (Cardiology) Early, Kristen Loader, MD (Vascular Surgery) Antony Contras, MD (Ophthalmology) Aris Lot, MD as Consulting Physician (Dermatology) Iva Boop, MD as Consulting Physician (Gastroenterology) Josephine Igo, DO as Consulting Physician (Pulmonary Disease)  Indicate any recent Medical Services you may have received from other than [facility] providers in the past year (date may be approximate).     Assessment:   This is a routine wellness examination for [name].  Hearing/Vision screen No results found.  Dietary issues and exercise activities discussed: Current Exercise Habits: The patient does not participate in regular exercise at present, Exercise limited by: None identified   Goals Addressed   None    Depression Screen    05/22/2022    1:44 PM 04/07/2022    1:27 PM 10/07/2021    1:56 PM 05/15/2021    3:20 PM 07/16/2020    4:03 PM 07/01/2018    1:43 PM 12/01/2016    5:30 PM  PHQ 2/9 Scores  PHQ - 2 Score 1 0 0 0 0 0 0  PHQ- 9 Score  2 0        Fall Risk    05/22/2022    1:43 PM 04/07/2022    1:28 PM 04/07/2022    1:27 PM 10/07/2021    1:56 PM 05/15/2021    3:22 PM  Fall Risk   Falls in the past year? 1 0 0 0 1  Number falls in past yr: 0 0 0 0 0  Injury with Fall? 0 0 0 0 1  Risk for fall due to : Impaired balance/gait    Impaired balance/gait;Impaired mobility  Follow up Falls evaluation completed Falls evaluation completed Falls evaluation completed Falls evaluation completed  Falls prevention discussed    FALL RISK PREVENTION PERTAINING TO THE HOME:  Any stairs in or around the home? Yes  If so, are there any without handrails? No  Home free of loose throw rugs in walkways, pet beds, electrical cords, etc? Yes  Adequate lighting in your home to reduce risk of falls? Yes   ASSISTIVE DEVICES UTILIZED TO PREVENT FALLS:  Life alert? No  Use of a cane, walker or w/c? No  Grab bars in the bathroom? Yes  Shower chair or bench in shower? Yes  Elevated toilet seat or a handicapped toilet? Yes   TIMED UP AND GO:  Was the test performed? Yes .  Length of time to ambulate 10 feet: 10 sec.   Gait slow and steady without use of assistive device  Cognitive Function:        05/22/2022    1:49 PM  6CIT Screen  What Year? 4 points  What month? 0 points  What time? 0 points  Count back from 20 2 points  Months in reverse 4 points  Repeat phrase  4 points  Total Score 14 points    Immunizations Immunization History  Administered Date(s) Administered   Fluad Quad(high Dose 65+) 10/17/2018, 10/07/2021   Influenza Split 09/12/2012   Influenza Whole 11/04/2006, 10/11/2008, 09/24/2011   Influenza, High Dose Seasonal PF 10/08/2015, 11/02/2017   Influenza-Unspecified 10/05/2013, 10/13/2014   PFIZER Comirnaty(Gray Top)Covid-19 Tri-Sucrose Vaccine 08/30/2020   PFIZER(Purple Top)SARS-COV-2 Vaccination 02/05/2019, 02/27/2019   Pneumococcal Conjugate-13 10/05/2013   Pneumococcal Polysaccharide-23 01/13/2003, 12/10/2014   Tetanus 12/12/2013   Zoster, Live 11/24/2011    TDAP status: Due, Education has been provided regarding the importance of this vaccine. Advised may receive this vaccine at local pharmacy or Health Dept. Aware to provide a copy of the vaccination record if obtained from local pharmacy or Health Dept. Verbalized acceptance and understanding.  Flu Vaccine status: Up to date  Pneumococcal vaccine status: Up to date  Covid-19 vaccine status:  Information provided on how to obtain vaccines.   Qualifies for Shingles Vaccine? Yes   Zostavax completed Yes   Shingrix Completed?: No.    Education has been provided regarding the importance of this vaccine. Patient has been advised to call insurance company to determine out of pocket expense if they have not yet received this vaccine. Advised may also receive vaccine at local pharmacy or Health Dept. Verbalized acceptance and understanding.  Screening Tests Health Maintenance  Topic Date Due   FOOT EXAM  Never done   Zoster Vaccines- Shingrix (1 of 2) Never done   DTaP/Tdap/Td (1 - Tdap) 12/13/2013   OPHTHALMOLOGY EXAM  12/28/2014   Medicare Annual Wellness (AWV)  05/16/2022   COVID-19 Vaccine (4 - 2023-24 season) 08/07/2022 (Originally 09/12/2021)   INFLUENZA VACCINE  08/13/2022   HEMOGLOBIN A1C  10/08/2022   Pneumonia Vaccine 64+ Years old  Completed   HPV VACCINES  Aged Out    Health Maintenance  Health Maintenance Due  Topic Date Due   FOOT EXAM  Never done   Zoster Vaccines- Shingrix (1 of 2) Never done   DTaP/Tdap/Td (1 - Tdap) 12/13/2013   OPHTHALMOLOGY EXAM  12/28/2014   Medicare Annual Wellness (AWV)  05/16/2022    Colorectal cancer screening: No longer required.   Lung Cancer Screening: (Low Dose CT Chest recommended if Age 57-80 years, 30 pack-year currently smoking OR have quit w/in 15years.) does not qualify.   Additional Screening:  Hepatitis C Screening: does not qualify  Vision Screening: Recommended annual ophthalmology exams for early detection of glaucoma and other disorders of the eye. Is the patient up to date with their annual eye exam?  Yes  Who is the provider or what is the name of the office in which the patient attends annual eye exams? Dr. Randon Goldsmith If pt is not established with a provider, would they like to be referred to a provider to establish care? No .   Dental Screening: Recommended annual dental exams for proper oral hygiene  Community  Resource Referral / Chronic Care Management: CRR required this visit?  No   CCM required this visit?  No      Plan:     I have personally reviewed and noted the following in the patient's chart:   Medical and social history Use of alcohol, tobacco or illicit drugs  Current medications and supplements including opioid prescriptions. Patient is currently taking opioid prescriptions. Information provided to patient regarding non-opioid alternatives. Patient advised to discuss non-opioid treatment plan with their provider. Functional ability and status Nutritional status Physical activity Advanced directives List of other  physicians Hospitalizations, surgeries, and ER visits in previous 12 months Vitals Screenings to include cognitive, depression, and falls Referrals and appointments  In addition, I have reviewed and discussed with patient certain preventive protocols, quality metrics, and best practice recommendations. A written personalized care plan for preventive services as well as general preventive health recommendations were provided to patient.     Donne Anon, [place]   05/22/2022   Nurse Notes: None

## 2022-05-22 NOTE — Patient Instructions (Signed)
[name] , Thank you for taking time to come for your Medicare Wellness Visit. I appreciate your ongoing commitment to your health goals. Please review the following plan we discussed and let me know if I can assist you in the future.      This is a list of the screening recommended for you and due dates:  Health Maintenance  Topic Date Due   Complete foot exam   Never done   Zoster (Shingles) Vaccine (1 of 2) Never done   DTaP/Tdap/Td vaccine (1 - Tdap) 12/13/2013   Eye exam for diabetics  12/28/2014   COVID-19 Vaccine (4 - 2023-24 season) 08/07/2022*   Flu Shot  08/13/2022   Hemoglobin A1C  10/08/2022   Medicare Annual Wellness Visit  05/22/2023   Pneumonia Vaccine  Completed   HPV Vaccine  Aged Out  *Topic was postponed. The date shown is not the original due date.     Next appointment: Follow up in one year for your annual wellness visit.   Preventive Care 24 Years and Older, Male Preventive care refers to lifestyle choices and visits with your health care provider that can promote health and wellness. What does preventive care include? A yearly physical exam. This is also called an annual well check. Dental exams once or twice a year. Routine eye exams. Ask your health care provider how often you should have your eyes checked. Personal lifestyle choices, including: Daily care of your teeth and gums. Regular physical activity. Eating a healthy diet. Avoiding tobacco and drug use. Limiting alcohol use. Practicing safe sex. Taking low doses of aspirin every day. Taking vitamin and mineral supplements as recommended by your health care provider. What happens during an annual well check? The services and screenings done by your health care provider during your annual well check will depend on your age, overall health, lifestyle risk factors, and family history of disease. Counseling  Your health care provider may ask you questions about your: Alcohol use. Tobacco  use. Drug use. Emotional well-being. Home and relationship well-being. Sexual activity. Eating habits. History of falls. Memory and ability to understand (cognition). Work and work Astronomer. Screening  You may have the following tests or measurements: Height, weight, and BMI. Blood pressure. Lipid and cholesterol levels. These may be checked every 5 years, or more frequently if you are over 26 years old. Skin check. Lung cancer screening. You may have this screening every year starting at age 44 if you have a 30-pack-year history of smoking and currently smoke or have quit within the past 15 years. Fecal occult blood test (FOBT) of the stool. You may have this test every year starting at age 25. Flexible sigmoidoscopy or colonoscopy. You may have a sigmoidoscopy every 5 years or a colonoscopy every 10 years starting at age 67. Prostate cancer screening. Recommendations will vary depending on your family history and other risks. Hepatitis C blood test. Hepatitis B blood test. Sexually transmitted disease (STD) testing. Diabetes screening. This is done by checking your blood sugar (glucose) after you have not eaten for a while (fasting). You may have this done every 1-3 years. Abdominal aortic aneurysm (AAA) screening. You may need this if you are a current or former smoker. Osteoporosis. You may be screened starting at age 83 if you are at high risk. Talk with your health care provider about your test results, treatment options, and if necessary, the need for more tests. Vaccines  Your health care provider may recommend certain vaccines, such  as: Influenza vaccine. This is recommended every year. Tetanus, diphtheria, and acellular pertussis (Tdap, Td) vaccine. You may need a Td booster every 10 years. Zoster vaccine. You may need this after age 15. Pneumococcal 13-valent conjugate (PCV13) vaccine. One dose is recommended after age 35. Pneumococcal polysaccharide (PPSV23) vaccine.  One dose is recommended after age 93. Talk to your health care provider about which screenings and vaccines you need and how often you need them. This information is not intended to replace advice given to you by your health care provider. Make sure you discuss any questions you have with your health care provider. Document Released: 01/25/2015 Document Revised: 09/18/2015 Document Reviewed: 10/30/2014 Elsevier Interactive Patient Education  2017 [place] Prevention in the Home Falls can cause injuries. They can happen to people of all ages. There are many things you can do to make your home safe and to help prevent falls. What can I do on the outside of my home? Regularly fix the edges of walkways and driveways and fix any cracks. Remove anything that might make you trip as you walk through a door, such as a raised step or threshold. Trim any bushes or trees on the path to your home. Use bright outdoor lighting. Clear any walking paths of anything that might make someone trip, such as rocks or tools. Regularly check to see if handrails are loose or broken. Make sure that both sides of any steps have handrails. Any raised decks and porches should have guardrails on the edges. Have any leaves, snow, or ice cleared regularly. Use sand or salt on walking paths during winter. Clean up any spills in your garage right away. This includes oil or grease spills. What can I do in the bathroom? Use night lights. Install grab bars by the toilet and in the tub and shower. Do not use towel bars as grab bars. Use non-skid mats or decals in the tub or shower. If you need to sit down in the shower, use a plastic, non-slip stool. Keep the floor dry. Clean up any water that spills on the floor as soon as it happens. Remove soap buildup in the tub or shower regularly. Attach bath mats securely with double-sided non-slip rug tape. Do not have throw rugs and other things on the floor that can make  you trip. What can I do in the bedroom? Use night lights. Make sure that you have a light by your bed that is easy to reach. Do not use any sheets or blankets that are too big for your bed. They should not hang down onto the floor. Have a firm chair that has side arms. You can use this for support while you get dressed. Do not have throw rugs and other things on the floor that can make you trip. What can I do in the kitchen? Clean up any spills right away. Avoid walking on wet floors. Keep items that you use a lot in easy-to-reach places. If you need to reach something above you, use a strong step stool that has a grab bar. Keep electrical cords out of the way. Do not use floor polish or wax that makes floors slippery. If you must use wax, use non-skid floor wax. Do not have throw rugs and other things on the floor that can make you trip. What can I do with my stairs? Do not leave any items on the stairs. Make sure that there are handrails on both sides of the stairs and use  them. Fix handrails that are broken or loose. Make sure that handrails are as long as the stairways. Check any carpeting to make sure that it is firmly attached to the stairs. Fix any carpet that is loose or worn. Avoid having throw rugs at the top or bottom of the stairs. If you do have throw rugs, attach them to the floor with carpet tape. Make sure that you have a light switch at the top of the stairs and the bottom of the stairs. If you do not have them, ask someone to add them for you. What else can I do to help prevent falls? Wear shoes that: Do not have high heels. Have rubber bottoms. Are comfortable and fit you well. Are closed at the toe. Do not wear sandals. If you use a stepladder: Make sure that it is fully opened. Do not climb a closed stepladder. Make sure that both sides of the stepladder are locked into place. Ask someone to hold it for you, if possible. Clearly mark and make sure that you can  see: Any grab bars or handrails. First and last steps. Where the edge of each step is. Use tools that help you move around (mobility aids) if they are needed. These include: Canes. Walkers. Scooters. Crutches. Turn on the lights when you go into a dark area. Replace any light bulbs as soon as they burn out. Set up your furniture so you have a clear path. Avoid moving your furniture around. If any of your floors are uneven, fix them. If there are any pets around you, be aware of where they are. Review your medicines with your doctor. Some medicines can make you feel dizzy. This can increase your chance of falling. Ask your doctor what other things that you can do to help prevent falls. This information is not intended to replace advice given to you by your health care provider. Make sure you discuss any questions you have with your health care provider. Document Released: 10/25/2008 Document Revised: 06/06/2015 Document Reviewed: 02/02/2014 Elsevier Interactive Patient Education  2017 [employer].

## 2022-05-25 ENCOUNTER — Other Ambulatory Visit: Payer: Self-pay

## 2022-06-05 ENCOUNTER — Other Ambulatory Visit (HOSPITAL_COMMUNITY): Payer: Self-pay

## 2022-06-09 ENCOUNTER — Other Ambulatory Visit (HOSPITAL_COMMUNITY): Payer: Self-pay

## 2022-06-09 ENCOUNTER — Other Ambulatory Visit: Payer: Self-pay | Admitting: Family Medicine

## 2022-06-09 NOTE — Telephone Encounter (Signed)
Requesting: tramadol 50mg   Contract:07/16/20 UDS: 07/16/20 Last Visit: 04/07/22 Next Visit: None Last Refill: 11/07/22 #90 and 1OX  Please Advise

## 2022-06-10 ENCOUNTER — Other Ambulatory Visit (HOSPITAL_COMMUNITY): Payer: Self-pay

## 2022-06-10 MED ORDER — GABAPENTIN 300 MG PO CAPS
300.0000 mg | ORAL_CAPSULE | Freq: Three times a day (TID) | ORAL | 1 refills | Status: DC
  Filled 2022-06-10: qty 270, 90d supply, fill #0
  Filled 2023-03-13: qty 270, 90d supply, fill #1

## 2022-06-10 MED ORDER — TRAMADOL HCL 50 MG PO TABS
50.0000 mg | ORAL_TABLET | Freq: Three times a day (TID) | ORAL | 0 refills | Status: DC | PRN
  Filled 2022-06-10: qty 90, 30d supply, fill #0

## 2022-06-10 NOTE — Telephone Encounter (Addendum)
**Note De-Identified Marchello Rothgeb Obfuscation** The pts wife, Randa Evens left her and the pt, Xavien out of pocket RX expense reports at the office. I have faxed all to [facility] as requested.  I did receive confirmation that the fax sent successfully.  Fax: Tx 'ok' Report CONE_EMAIL-to-Fax Herschel Fleagle, Elease Hashimoto This message was sent Kyrsten Deleeuw [facility], a product from [employer]. [email or web address]  -------Fax Transmission Report------- To:               Recipient at [phone] Subject:          Fw: Thadius&Joanne Baig Result:           The transmission was successful. Explanation:      All Pages Ok Pages Sent:       8 Connect Time:     2 minutes, 19 seconds Transmit Time:    06/10/2022 09:42 Transfer Rate:    14400 Status Code:      0000 Retry Count:      1 Job Id:           [id number] Unique Id:        [id number] Fax Line:         32 Fax Server:       [id number]

## 2022-06-11 NOTE — Telephone Encounter (Signed)
**Note De-Identified [name] Obfuscation** Letter received from [facility] stating that they have approved the pt for Eliquis assistance until 01/12/2023. [id number]  I have advised the pt of this approval [name] phone call.

## 2022-06-12 ENCOUNTER — Other Ambulatory Visit (HOSPITAL_COMMUNITY): Payer: Self-pay

## 2022-06-27 ENCOUNTER — Other Ambulatory Visit: Payer: Self-pay | Admitting: Gastroenterology

## 2022-06-29 ENCOUNTER — Other Ambulatory Visit (HOSPITAL_COMMUNITY): Payer: Self-pay

## 2022-06-29 MED ORDER — METOCLOPRAMIDE HCL 5 MG PO TABS
5.0000 mg | ORAL_TABLET | Freq: Every day | ORAL | 3 refills | Status: DC
  Filled 2022-06-29: qty 30, 30d supply, fill #0
  Filled 2022-08-06: qty 30, 30d supply, fill #1
  Filled 2022-08-31: qty 30, 30d supply, fill #2
  Filled 2022-10-05: qty 30, 30d supply, fill #3

## 2022-06-30 ENCOUNTER — Other Ambulatory Visit (HOSPITAL_COMMUNITY): Payer: Self-pay

## 2022-07-08 ENCOUNTER — Other Ambulatory Visit (HOSPITAL_COMMUNITY): Payer: Self-pay

## 2022-07-08 ENCOUNTER — Other Ambulatory Visit (HOSPITAL_BASED_OUTPATIENT_CLINIC_OR_DEPARTMENT_OTHER): Payer: Self-pay

## 2022-07-08 ENCOUNTER — Other Ambulatory Visit: Payer: Self-pay | Admitting: Family Medicine

## 2022-07-08 DIAGNOSIS — E559 Vitamin D deficiency, unspecified: Secondary | ICD-10-CM

## 2022-07-08 DIAGNOSIS — I10 Essential (primary) hypertension: Secondary | ICD-10-CM

## 2022-07-08 DIAGNOSIS — I6350 Cerebral infarction due to unspecified occlusion or stenosis of unspecified cerebral artery: Secondary | ICD-10-CM

## 2022-07-08 DIAGNOSIS — E782 Mixed hyperlipidemia: Secondary | ICD-10-CM

## 2022-07-08 MED ORDER — ATORVASTATIN CALCIUM 40 MG PO TABS
40.0000 mg | ORAL_TABLET | Freq: Every day | ORAL | 0 refills | Status: DC
Start: 2022-07-08 — End: 2022-10-05
  Filled 2022-07-08 – 2022-07-09 (×2): qty 90, 90d supply, fill #0

## 2022-07-08 MED ORDER — TRAMADOL HCL 50 MG PO TABS
50.0000 mg | ORAL_TABLET | Freq: Three times a day (TID) | ORAL | 0 refills | Status: DC | PRN
  Filled 2022-07-08: qty 90, 30d supply, fill #0

## 2022-07-09 ENCOUNTER — Other Ambulatory Visit: Payer: Self-pay

## 2022-07-09 ENCOUNTER — Other Ambulatory Visit (HOSPITAL_BASED_OUTPATIENT_CLINIC_OR_DEPARTMENT_OTHER): Payer: Self-pay

## 2022-07-09 ENCOUNTER — Other Ambulatory Visit (HOSPITAL_COMMUNITY): Payer: Self-pay

## 2022-07-15 ENCOUNTER — Other Ambulatory Visit (HOSPITAL_COMMUNITY): Payer: Self-pay

## 2022-08-06 ENCOUNTER — Other Ambulatory Visit: Payer: Self-pay | Admitting: Family Medicine

## 2022-08-06 ENCOUNTER — Other Ambulatory Visit: Payer: Self-pay | Admitting: Internal Medicine

## 2022-08-06 ENCOUNTER — Other Ambulatory Visit: Payer: Self-pay

## 2022-08-06 ENCOUNTER — Other Ambulatory Visit (HOSPITAL_COMMUNITY): Payer: Self-pay

## 2022-08-06 MED ORDER — MINOXIDIL 10 MG PO TABS
10.0000 mg | ORAL_TABLET | Freq: Two times a day (BID) | ORAL | 3 refills | Status: DC
  Filled 2022-08-06 – 2022-08-07 (×2): qty 180, 90d supply, fill #0
  Filled 2022-11-09 – 2022-11-20 (×3): qty 180, 90d supply, fill #1
  Filled 2022-11-20 – 2022-11-30 (×2): qty 180, 90d supply, fill #2
  Filled 2023-05-25: qty 180, 90d supply, fill #3

## 2022-08-06 NOTE — Telephone Encounter (Signed)
Requesting: Tramadol 50mg  Contract: 04/07/2022 UDS: N/A Last Visit: 04/07/2022 Next Visit: N/A Last Refill: 07/08/2022  Please Advise

## 2022-08-07 ENCOUNTER — Other Ambulatory Visit: Payer: Self-pay

## 2022-08-07 ENCOUNTER — Other Ambulatory Visit (HOSPITAL_COMMUNITY): Payer: Self-pay

## 2022-08-07 MED ORDER — TRAMADOL HCL 50 MG PO TABS
50.0000 mg | ORAL_TABLET | Freq: Three times a day (TID) | ORAL | 0 refills | Status: DC | PRN
  Filled 2022-08-07 (×2): qty 90, 30d supply, fill #0

## 2022-08-10 ENCOUNTER — Other Ambulatory Visit (HOSPITAL_COMMUNITY): Payer: Self-pay

## 2022-08-15 ENCOUNTER — Other Ambulatory Visit: Payer: Self-pay

## 2022-08-15 ENCOUNTER — Emergency Department (HOSPITAL_COMMUNITY)
Admission: EM | Admit: 2022-08-15 | Discharge: 2022-08-15 | Disposition: A | Discharge status: Home / Self Care | Payer: Medicare HMO | Attending: Emergency Medicine | Admitting: Emergency Medicine

## 2022-08-15 ENCOUNTER — Ambulatory Visit (HOSPITAL_COMMUNITY)
Admission: EM | Admit: 2022-08-15 | Discharge: 2022-08-15 | Disposition: A | Discharge status: Home / Self Care | Payer: Medicare HMO | Source: Home / Self Care

## 2022-08-15 ENCOUNTER — Encounter (HOSPITAL_COMMUNITY): Payer: Self-pay | Admitting: *Deleted

## 2022-08-15 ENCOUNTER — Emergency Department (HOSPITAL_COMMUNITY): Payer: Medicare HMO

## 2022-08-15 ENCOUNTER — Encounter (HOSPITAL_COMMUNITY): Payer: Self-pay

## 2022-08-15 DIAGNOSIS — I480 Paroxysmal atrial fibrillation: Secondary | ICD-10-CM | POA: Diagnosis not present

## 2022-08-15 DIAGNOSIS — Z79899 Other long term (current) drug therapy: Secondary | ICD-10-CM | POA: Insufficient documentation

## 2022-08-15 DIAGNOSIS — J45909 Unspecified asthma, uncomplicated: Secondary | ICD-10-CM | POA: Insufficient documentation

## 2022-08-15 DIAGNOSIS — I5033 Acute on chronic diastolic (congestive) heart failure: Secondary | ICD-10-CM

## 2022-08-15 DIAGNOSIS — I509 Heart failure, unspecified: Secondary | ICD-10-CM | POA: Insufficient documentation

## 2022-08-15 DIAGNOSIS — R0989 Other specified symptoms and signs involving the circulatory and respiratory systems: Secondary | ICD-10-CM | POA: Diagnosis not present

## 2022-08-15 DIAGNOSIS — R0602 Shortness of breath: Secondary | ICD-10-CM | POA: Diagnosis not present

## 2022-08-15 DIAGNOSIS — Z7901 Long term (current) use of anticoagulants: Secondary | ICD-10-CM | POA: Diagnosis not present

## 2022-08-15 DIAGNOSIS — I110 Hypertensive heart disease with heart failure: Secondary | ICD-10-CM | POA: Insufficient documentation

## 2022-08-15 DIAGNOSIS — R0601 Orthopnea: Secondary | ICD-10-CM

## 2022-08-15 DIAGNOSIS — J90 Pleural effusion, not elsewhere classified: Secondary | ICD-10-CM | POA: Diagnosis not present

## 2022-08-15 DIAGNOSIS — S2220XA Unspecified fracture of sternum, initial encounter for closed fracture: Secondary | ICD-10-CM | POA: Diagnosis not present

## 2022-08-15 HISTORY — DX: Fracture of unspecified part of neck of right femur, initial encounter for closed fracture: S72.001A

## 2022-08-15 HISTORY — DX: Other specified postprocedural states: Z98.890

## 2022-08-15 LAB — BASIC METABOLIC PANEL
Anion gap: 11 (ref 5–15)
BUN: 17 mg/dL (ref 8–23)
CO2: 26 mmol/L (ref 22–32)
Calcium: 8.6 mg/dL — ABNORMAL LOW (ref 8.9–10.3)
Chloride: 103 mmol/L (ref 98–111)
Creatinine, Ser: 1 mg/dL (ref 0.61–1.24)
GFR, Estimated: >60 mL/min (ref >60)
Glucose, Bld: 100 mg/dL — ABNORMAL HIGH (ref 70–99)
Potassium: 4.1 mmol/L (ref 3.5–5.1)
Sodium: 140 mmol/L (ref 135–145)

## 2022-08-15 LAB — CBC
HCT: 28.7 % — ABNORMAL LOW (ref 39.0–52.0)
Hemoglobin: 9.5 g/dL — ABNORMAL LOW (ref 13.0–17.0)
MCH: 29.4 pg (ref 26.0–34.0)
MCHC: 33.1 g/dL (ref 30.0–36.0)
MCV: 88.9 fL (ref 80.0–100.0)
Platelets: 389 10*3/uL (ref 150–400)
RBC: 3.23 MIL/uL — ABNORMAL LOW (ref 4.22–5.81)
RDW: 13.9 % (ref 11.5–15.5)
WBC: 8.3 10*3/uL (ref 4.0–10.5)
nRBC: 0 % (ref 0.0–0.2)

## 2022-08-15 LAB — TROPONIN I (HIGH SENSITIVITY)
Troponin I (High Sensitivity): 21 ng/L — ABNORMAL HIGH (ref <18)
Troponin I (High Sensitivity): 21 ng/L — ABNORMAL HIGH (ref <18)

## 2022-08-15 LAB — BRAIN NATRIURETIC PEPTIDE: B Natriuretic Peptide: 545.6 pg/mL — ABNORMAL HIGH (ref 0.0–100.0)

## 2022-08-15 MED ORDER — FUROSEMIDE 40 MG PO TABS: ORAL_TABLET | ORAL | 0 refills | Status: DC

## 2022-08-15 MED ORDER — TRAMADOL HCL 50 MG PO TABS
50.0000 mg | ORAL_TABLET | Freq: Once | ORAL | Status: AC
  Administered 2022-08-15: 50 mg via ORAL
  Filled 2022-08-15: qty 1

## 2022-08-15 MED ORDER — FUROSEMIDE 10 MG/ML IJ SOLN
40.0000 mg | Freq: Once | INTRAMUSCULAR | Status: AC
  Administered 2022-08-15: 40 mg via INTRAVENOUS
  Filled 2022-08-15: qty 4

## 2022-08-15 NOTE — Subjective & Objective (Signed)
CC: orthopnea, SOB HPI: 89 year old white male history of chronic diastolic heart failure not on anticoagulation, history of chronic A-fib on Eliquis, hypertension, type 2 diabetes, presents the ER today with 4 to 5-day history or of orthopnea.  Patient and his wife just got back on Sunday from a weeklong trip to the beach.  They were eating out every day.  They normally cook at home.  They admit to lots of salt intake while eating out.  Patient does not use any diuretics at home.  He has noticed worsening orthopnea over the last 2 to 3 days.  He has been sleeping in a recliner.  He has chronic pedal edema.  Denies any chest pain, fever, chills.  On arrival temp 98.2 heart rate 50 blood pressure 182/79 satting 93% on room air.  BNP elevated at 545  Sodium 140, potassium 4.1, BUN of 17, creatinine 1.0  White count 8.3, hemoglobin 9.5, platelets of 389  Chest x-ray showed cardiomegaly and pulmonary edema.  EKG shows rate controlled A-fib.  [facility] hospitalist consulted.

## 2022-08-15 NOTE — ED Triage Notes (Signed)
Pt reports SHOB  started 2 days ago . Pt used his inhaler yesterday and he felt better. Pt di dnot use his inhaler today.

## 2022-08-15 NOTE — ED Provider Notes (Signed)
EMERGENCY DEPARTMENT AT [facility] Provider Note   CSN: [id number] Arrival date & time: 08/15/22  1825     History {Add pertinent medical, surgical, social history, OB history to HPI:1} No chief complaint on file.   DUC CROCKET is a 89 y.o. male.  HPI    Patient is a 89 year old male with past medical history of labile hypertension, cardiovascular disease status post left CEA 2002, hyperlipidemia, GERD, reactive airways disease, esophageal stricture, and paroxysmal atrial fibrillation, presenting today for shortness of breath.  He states he has had worsening difficulty breathing over the last 3 to 4 days.  He notes that last night he had to sleep in a recliner as he has increased shortness of breath with lying flat in the bed.  He denies any fevers or cough.  He has no hemoptysis.  He has had increased lower extremity edema for the last 1 month.  He states it improves after lying in the bed overnight, but worsens throughout the day.  He denies any chest pain.  He has no nausea, vomiting, or diarrhea.  He denies taking any diuretics at home.  Home Medications Prior to Admission medications   Medication Sig Start Date End Date Taking? Authorizing Provider  acetaminophen (TYLENOL) 500 MG tablet Take 1,000 mg by mouth every 6 (six) hours as needed for mild pain or headache.    [provider]  albuterol (VENTOLIN HFA) 108 (90 Base) MCG/ACT inhaler Inhale 1-2 puffs into the lungs every 6 (six) hours as needed for wheezing or shortness of breath. 04/07/22   Bradd Canary, MD  amLODipine (NORVASC) 5 MG tablet Take 2 tablets (10 mg total) by mouth daily (need office visit) 04/15/22   Pricilla Riffle, MD  apixaban (ELIQUIS) 5 MG TABS tablet Take 1 tablet (5 mg) by mouth 2 times daily. 05/04/22 05/04/23  Pricilla Riffle, MD  atorvastatin (LIPITOR) 40 MG tablet Take 1 tablet (40 mg total) by mouth daily. 07/08/22   Bradd Canary, MD  Cholecalciferol (VITAMIN D3 PO) Take 1  tablet by mouth in the morning. Unsure of how many units    [provider]  docusate sodium (COLACE) 100 MG capsule Take 300 mg by mouth 2 (two) times daily.    [provider]  doxycycline (VIBRA-TABS) 100 MG tablet Take 1 tablet (100 mg total) by mouth 2 (two) times daily. 04/09/22   Bradd Canary, MD  Ferrous Fumarate (HEMOCYTE - 106 MG FE) 324 (106 Fe) MG TABS tablet Take 1 tablet (106 mg of iron total) by mouth 2 times daily. 08/07/21   Bradd Canary, MD  gabapentin (NEURONTIN) 300 MG capsule Take 1 capsule (300 mg total) by mouth 3 (three) times daily. 06/10/22 06/10/23  Bradd Canary, MD  hyoscyamine (LEVSIN SL) 0.125 MG SL tablet Place 1 tablet under the tongue every 4 hours as needed. 04/07/22   Bradd Canary, MD  lisinopril (ZESTRIL) 5 MG tablet Take 1 tablet (5 mg) by mouth daily. 04/07/22   Bradd Canary, MD  metoCLOPramide (REGLAN) 5 MG tablet Take 1 tablet (5 mg total) by mouth at bedtime. Call for appointment for future refills. 06/29/22   Zehr, Princella Pellegrini, PA-C  minoxidil (LONITEN) 10 MG tablet Take 1 tablet (10 mg) by mouth 2 times daily. 08/06/22   Pricilla Riffle, MD  ondansetron (ZOFRAN ODT) 8 MG disintegrating tablet Dissolve 1 tablet by mouth every 8 hours as needed for nausea or vomiting.  08/23/20   Bradd Canary, MD  pantoprazole (PROTONIX) 40 MG tablet TAKE 1 TABLET BY MOUTH ONCE A DAY BEFORE BREAKFAST 03/18/20 07/01/21  Iva Boop, MD  polyvinyl alcohol (LIQUIFILM TEARS) 1.4 % ophthalmic solution Place 1 drop into both eyes 5 (five) times daily as needed for dry eyes.    [provider]  sennosides-docusate sodium (SENOKOT-S) 8.6-50 MG tablet Take 1-2 tablets by mouth See admin instructions. Take 1 tablet in the morning & take 2 tablet at nigh    [provider]  traMADol (ULTRAM) 50 MG tablet Take 1 tablet (50 mg) by mouth 3 times daily as needed. 08/07/22 02/03/23  Bradd Canary, MD  triamcinolone cream (KENALOG) 0.1 % Apply topically  to affected areas of the skin daily as needed. 10/16/21     triamcinolone cream (KENALOG) 0.5 % Apply 1 application topically daily as needed (for skin irritation).  01/22/12   Etta Grandchild, MD  Vitamin D, Ergocalciferol, (DRISDOL) 1.25 MG (50000 UNIT) CAPS capsule Take 1 capsule (50,000 Units total) by mouth every 7 (seven) days. 04/09/22   Bradd Canary, MD      Allergies    Hctz [hydrochlorothiazide], Hydralazine, Diovan [valsartan], Metformin and related, Nsaids, Other, Prednisone, Spironolactone, Codeine, and Oxycodone-acetaminophen    Review of Systems   Review of Systems Negative except for as noted above in HPI  Physical Exam Updated Vital Signs BP (!) 182/79 (BP Location: Right Arm)   Pulse (!) 50   Temp 98.2 F (36.8 C) (Oral)   Resp 18   Wt 61.2 kg   SpO2 93%   BMI 18.83 kg/m  Physical Exam Vitals and nursing note reviewed.  Constitutional:      General: He is not in acute distress.    Appearance: He is well-developed.  HENT:     Head: Normocephalic and atraumatic.  Eyes:     Conjunctiva/sclera: Conjunctivae normal.  Cardiovascular:     Rate and Rhythm: Bradycardia present. Rhythm irregular.     Heart sounds: No murmur heard.    No friction rub. No gallop.  Pulmonary:     Effort: Pulmonary effort is normal. No respiratory distress.     Breath sounds: Rales (Bilateral rales present) present.  Abdominal:     Palpations: Abdomen is soft.     Tenderness: There is no abdominal tenderness. There is no guarding or rebound.  Musculoskeletal:        General: Swelling (2+ pitting edema to bilateral lower extremities) present. No tenderness.     Cervical back: Neck supple.  Skin:    General: Skin is warm and dry.     Capillary Refill: Capillary refill takes less than 2 seconds.     Findings: No erythema.  Neurological:     Mental Status: He is alert and oriented to person, place, and time.     Motor: No weakness.  Psychiatric:        Mood and Affect: Mood  normal.     ED Results / Procedures / Treatments   Labs (all labs ordered are listed, but only abnormal results are displayed) Labs Reviewed  BASIC METABOLIC PANEL  CBC  BRAIN NATRIURETIC PEPTIDE  TROPONIN I (HIGH SENSITIVITY)    EKG None  Radiology No results found.  Procedures Procedures  {Document cardiac monitor, telemetry assessment procedure when appropriate:1}  Medications Ordered in ED Medications - No data to display  ED Course/ Medical Decision Making/ A&P   {   Click here for ABCD2,  HEART and other calculatorsREFRESH Note before signing :1}                              Medical Decision Making Amount and/or Complexity of Data Reviewed Labs: ordered. Radiology: ordered.  Risk Prescription drug management.   *** Patient is a 89 year old male with past medical history of labile hypertension, cardiovascular disease status post left CEA 2002, hyperlipidemia, GERD, reactive airways disease, esophageal stricture, and paroxysmal atrial fibrillation, presenting today for shortness of breath.  On exam, patient is alert and oriented. He is in Afib w/ rate in the 50s. He has bilateral crackles present with 2+ pitting edema of BLE.   Presentation most concerning for CHF exacerbation. Considered ACS,  however less likely with his days of symptoms and no chest pain. Will evaluate for pneumonia and pleural effusion.   EKG reveals atrial fibrillation at 76 bpm with nonspecific ST changes.  Chest x-ray shows: Cardiomegaly with vascular congestion and bilateral pleural effusions. Findings consistent with CHF.    {Document critical care time when appropriate:1} {Document review of labs and clinical decision tools ie heart score, Chads2Vasc2 etc:1}  {Document your independent review of radiology images, and any outside records:1} {Document your discussion with family members, caretakers, and with consultants:1} {Document social determinants of health affecting pt's  care:1} {Document your decision making why or why not admission, treatments were needed:1} Final Clinical Impression(s) / ED Diagnoses Final diagnoses:  None    Rx / DC Orders ED Discharge Orders     None

## 2022-08-15 NOTE — ED Notes (Signed)
Patient is being discharged from the Urgent Care and sent to the Emergency Department via POV . Per provider, patient is in need of higher level of care due to SOB and abnormal EKG  Patient is aware and verbalizes understanding of plan of care.  Vitals:   08/15/22 1735  BP: (!) 191/72  Pulse: 72  Resp: (!) 22  Temp: 98.5 F (36.9 C)  SpO2: 96%

## 2022-08-15 NOTE — ED Notes (Signed)
Patient verbalizes understanding of discharge instructions. Opportunity for questioning and answers were provided. Armband removed by staff, pt discharged from ED. Pt taken to ED waiting room via wheel chair.  

## 2022-08-15 NOTE — ED Triage Notes (Signed)
Pt arrives with c/o SOB that's started 2-3 days ago. Pt denies CP. Pt seen at [facility] and told to come for evaluation.

## 2022-08-15 NOTE — Assessment & Plan Note (Signed)
Chronic.  Patient remains on Eliquis.  Rate is controlled.

## 2022-08-15 NOTE — Consult Note (Signed)
History and Physical    [name] KGM:[id number] DOB: April 24, 1929 DOA: 08/15/2022  DOS: the patient was seen and examined on 08/15/2022  PCP: Bradd Canary, MD   Patient coming from: Home  I have personally briefly reviewed patient's old medical records in [facility]  CC: orthopnea, SOB HPI: 89 year old white male history of chronic diastolic heart failure not on anticoagulation, history of chronic A-fib on Eliquis, hypertension, type 2 diabetes, presents the ER today with 4 to 5-day history or of orthopnea.  Patient and his wife just got back on Sunday from a weeklong trip to the beach.  They were eating out every day.  They normally cook at home.  They admit to lots of salt intake while eating out.  Patient does not use any diuretics at home.  He has noticed worsening orthopnea over the last 2 to 3 days.  He has been sleeping in a recliner.  He has chronic pedal edema.  Denies any chest pain, fever, chills.  On arrival temp 98.2 heart rate 50 blood pressure 182/79 satting 93% on room air.  BNP elevated at 545  Sodium 140, potassium 4.1, BUN of 17, creatinine 1.0  White count 8.3, hemoglobin 9.5, platelets of 389  Chest x-ray showed cardiomegaly and pulmonary edema.  EKG shows rate controlled A-fib.  [facility] consulted.   ED Course: BNP 545. CXR shows pulmonary edema  Review of Systems:  Review of Systems  Constitutional: Negative.   HENT: Negative.    Respiratory: Negative.    Cardiovascular:  Positive for orthopnea and PND. Negative for chest pain.       Chronic pedal edema. No worse than normal  Gastrointestinal: Negative.   Genitourinary: Negative.   Musculoskeletal: Negative.   Skin: Negative.   Neurological: Negative.   Endo/Heme/Allergies: Negative.   Psychiatric/Behavioral: Negative.    All other systems reviewed and are negative.   Past Medical History:  Diagnosis Date   Acute bronchitis 06/10/2015   Allergic rhinitis    Anemia     Asthma    Carotid stenosis    a. s/p Left CEA 2002;  b. Carotid US 3/16:  patent R CEA, L < 40%   Chronic pain syndrome    Left shoulder, back   Closed fracture of neck of right femur (HCC)    Diverticulosis    Dyslipidemia    Epigastric pain 04/05/2016   Esophageal stricture    Distal, benign   Femur fracture, left (HCC) 10/27/2018   GERD (gastroesophageal reflux disease)    Hemorrhoids    HTN (hypertension)    Negative renal duplex 07-22-11   Hx of echocardiogram    a. Echo 10/11: mod LVH, EF 55-60%   Pleural effusion 07/17/2020   Prostate cancer (HCC) 2000   Seed XRT   S/P thoracentesis    Spondylosis, cervical, with myelopathy 11/26/2015   Stroke (HCC) 08/1999   right thalamic - on chronic Plavix/ASA    Past Surgical History:  Procedure Laterality Date   ANTERIOR APPROACH HEMI HIP ARTHROPLASTY Right 03/15/2021   Procedure: ANTERIOR APPROACH HEMI HIP ARTHROPLASTY;  Surgeon: Samson Frederic, MD;  Location: [facility] OR;  Service: Orthopedics;  Laterality: Right;   APPENDECTOMY  1953   CAROTID ENDARTERECTOMY Right 01/2000   CATARACT EXTRACTION Bilateral    CERVICAL DISC SURGERY  8/07   CHEST TUBE INSERTION Left 11/11/2020   Procedure: INSERTION PLEURAL DRAINAGE CATHETER;  Surgeon: Josephine Igo, DO;  Location: [facility] ENDOSCOPY;  Service: Pulmonary;  Laterality: Left;  Indwelling Pleural Catheter   HIP ARTHROPLASTY Left 10/24/2018   Procedure: ARTHROPLASTY BIPOLAR HIP (HEMIARTHROPLASTY);  Surgeon: Durene Romans, MD;  Location: [facility] OR;  Service: Orthopedics;  Laterality: Left;   INSERTION PROSTATE RADIATION SEED  2000   LUMBAR DISC SURGERY  1990s   NASAL SINUS SURGERY     x 3   REMOVAL OF PLEURAL DRAINAGE CATHETER N/A 12/24/2020   Procedure: MINOR REMOVAL OF PLEURAL DRAINAGE CATHETER;  Surgeon: Josephine Igo, DO;  Location: [facility] END[facility]COPY;  Service: Cardiopulmonary;  Laterality: N/A;   THORACENTESIS N/A 08/15/2020   Procedure: THORACENTESIS;  Surgeon: Josephine Igo, DO;  Location:  [facility] ENDOSCOPY;  Service: Pulmonary;  Laterality: N/A;     reports that he quit smoking about 44 years ago. His smoking use included cigarettes. He started smoking about 74 years ago. He has a 8.8 pack-year smoking history. His smokeless tobacco use includes chew. He reports that he does not drink alcohol and does not use drugs.  Allergies  Allergen Reactions   Hctz [Hydrochlorothiazide] Shortness Of Breath, Swelling and Other (See Comments)    "Swelling and dyspnea"    Hydralazine Shortness Of Breath and Other (See Comments)    Chest pain and GI issues also   Diovan [Valsartan] Other (See Comments)    Elevated potassium- Hyperkalemia   Metformin And Related Other (See Comments)    Reaction not recalled   Nsaids Other (See Comments)    Other than Tylenol, he isn't to have these because he's on Eliquis   Other Other (See Comments)    Clonidine B/P patch = Caused redness   Prednisone Other (See Comments)    Suicidal thoughts   Spironolactone Swelling    Site of swelling not recalled   Codeine Itching and Nausea Only   Oxycodone-Acetaminophen Itching and Nausea Only    Family History  Problem Relation Age of Onset   Stroke Brother    Stroke Sister    Stroke Mother    Hyperlipidemia Mother    Hypertension Mother    Lung disease Father    Diabetes Son    Gout Son    Obesity Sister    Alcohol abuse Brother    Cancer Brother        lung, smoker    Prior to Admission medications   Medication Sig Start Date End Date Taking? Authorizing Provider  acetaminophen (TYLENOL) 500 MG tablet Take 1,000 mg by mouth every 6 (six) hours as needed for mild pain or headache.    [provider]  albuterol (VENTOLIN HFA) 108 (90 Base) MCG/ACT inhaler Inhale 1-2 puffs into the lungs every 6 (six) hours as needed for wheezing or shortness of breath. 04/07/22   Bradd Canary, MD  amLODipine (NORVASC) 5 MG tablet Take 2 tablets (10 mg total) by mouth daily (need office visit) 04/15/22    Pricilla Riffle, MD  apixaban (ELIQUIS) 5 MG TABS tablet Take 1 tablet (5 mg) by mouth 2 times daily. 05/04/22 05/04/23  Pricilla Riffle, MD  atorvastatin (LIPITOR) 40 MG tablet Take 1 tablet (40 mg total) by mouth daily. 07/08/22   Bradd Canary, MD  Cholecalciferol (VITAMIN D3 PO) Take 1 tablet by mouth in the morning. Unsure of how many units    [provider]  docusate sodium (COLACE) 100 MG capsule Take 300 mg by mouth 2 (two) times daily.    [provider]  doxycycline (VIBRA-TABS) 100 MG tablet Take 1 tablet (100 mg total) by mouth 2 (  two) times daily. 04/09/22   Bradd Canary, MD  Ferrous Fumarate (HEMOCYTE - 106 MG FE) 324 (106 Fe) MG TABS tablet Take 1 tablet (106 mg of iron total) by mouth 2 times daily. 08/07/21   Bradd Canary, MD  gabapentin (NEURONTIN) 300 MG capsule Take 1 capsule (300 mg total) by mouth 3 (three) times daily. 06/10/22 06/10/23  Bradd Canary, MD  hyoscyamine (LEVSIN SL) 0.125 MG SL tablet Place 1 tablet under the tongue every 4 hours as needed. 04/07/22   Bradd Canary, MD  lisinopril (ZESTRIL) 5 MG tablet Take 1 tablet (5 mg) by mouth daily. 04/07/22   Bradd Canary, MD  metoCLOPramide (REGLAN) 5 MG tablet Take 1 tablet (5 mg total) by mouth at bedtime. Call for appointment for future refills. 06/29/22   Zehr, Princella Pellegrini, PA-C  minoxidil (LONITEN) 10 MG tablet Take 1 tablet (10 mg) by mouth 2 times daily. 08/06/22   Pricilla Riffle, MD  ondansetron (ZOFRAN ODT) 8 MG disintegrating tablet Dissolve 1 tablet by mouth every 8 hours as needed for nausea or vomiting. 08/23/20   Bradd Canary, MD  pantoprazole (PROTONIX) 40 MG tablet TAKE 1 TABLET BY MOUTH ONCE A DAY BEFORE BREAKFAST 03/18/20 07/01/21  Iva Boop, MD  polyvinyl alcohol (LIQUIFILM TEARS) 1.4 % ophthalmic solution Place 1 drop into both eyes 5 (five) times daily as needed for dry eyes.    [provider]  sennosides-docusate sodium (SENOKOT-S) 8.6-50 MG tablet Take 1-2 tablets by  mouth See admin instructions. Take 1 tablet in the morning & take 2 tablet at nigh    [provider]  traMADol (ULTRAM) 50 MG tablet Take 1 tablet (50 mg) by mouth 3 times daily as needed. 08/07/22 02/03/23  Bradd Canary, MD  triamcinolone cream (KENALOG) 0.1 % Apply topically to affected areas of the skin daily as needed. 10/16/21     triamcinolone cream (KENALOG) 0.5 % Apply 1 application topically daily as needed (for skin irritation).  01/22/12   Etta Grandchild, MD  Vitamin D, Ergocalciferol, (DRISDOL) 1.25 MG (50000 UNIT) CAPS capsule Take 1 capsule (50,000 Units total) by mouth every 7 (seven) days. 04/09/22   Bradd Canary, MD    Physical Exam: Vitals:   08/15/22 1842 08/15/22 1848 08/15/22 1930 08/15/22 2030  BP: (!) 182/79  (!) 198/76 (!) 190/66  Pulse: (!) 50  71 61  Resp: 18  16 13   Temp: 98.2 F (36.8 C)     TempSrc: Oral     SpO2: 93%  95% 96%  Weight:  61.2 kg      Physical Exam Vitals and nursing note reviewed.  Constitutional:      General: He is not in acute distress.    Appearance: He is not toxic-appearing.  HENT:     Head: Normocephalic and atraumatic.     Nose: Nose normal.  Cardiovascular:     Rate and Rhythm: Normal rate. Rhythm irregular.  Pulmonary:     Effort: Pulmonary effort is normal.     Breath sounds: Examination of the right-lower field reveals rales. Examination of the left-lower field reveals rales. Rales present.  Abdominal:     General: Bowel sounds are normal. There is no distension.     Tenderness: There is no abdominal tenderness.  Musculoskeletal:     Right lower leg: Edema present.     Left lower leg: Edema present.  Skin:    General: Skin is dry.  Capillary Refill: Capillary refill takes less than 2 seconds.  Neurological:     General: No focal deficit present.     Mental Status: He is alert and oriented to person, place, and time.      Labs on Admission: I have personally reviewed following labs and imaging  studies  CBC: Recent Labs  Lab 08/15/22 1849  WBC 8.3  HGB 9.5*  HCT 28.7*  MCV 88.9  PLT 389   Basic Metabolic Panel: Recent Labs  Lab 08/15/22 1849  NA 140  K 4.1  CL 103  CO2 26  GLUCOSE 100*  BUN 17  CREATININE 1.00  CALCIUM 8.6*   GFR: Estimated Creatinine Clearance: 42.5 mL/min (by C-G formula based on SCr of 1 mg/dL).  Cardiac Enzymes: Recent Labs  Lab 08/15/22 1849 08/15/22 2037  TROPONINIHS 21* 21*   BNP (last 3 results) Recent Labs    08/15/22 1849  BNP 545.6*   Urine analysis:    Component Value Date/Time   COLORURINE YELLOW 04/03/2021 1634   APPEARANCEUR CLEAR 04/03/2021 1634   LABSPEC >=1.030 (A) 04/03/2021 1634   PHURINE 5.5 04/03/2021 1634   GLUCOSEU NEGATIVE 04/03/2021 1634   HGBUR TRACE-INTACT (A) 04/03/2021 1634   HGBUR 2+ 12/11/2008 1025   BILIRUBINUR small 04/17/2021 1056   KETONESUR NEGATIVE 04/03/2021 1634   PROTEINUR Positive (A) 04/17/2021 1056   PROTEINUR 100 (A) 03/18/2021 1050   UROBILINOGEN 0.2 04/17/2021 1056   UROBILINOGEN 0.2 04/03/2021 1634   NITRITE negative 04/17/2021 1056   NITRITE NEGATIVE 04/03/2021 1634   LEUKOCYTESUR Negative 04/17/2021 1056   LEUKOCYTESUR NEGATIVE 04/03/2021 1634    Radiological Exams on Admission: I have personally reviewed images DG Chest 2 View  Result Date: 08/15/2022 CLINICAL DATA:  Shortness of breath. EXAM: CHEST - 2 VIEW COMPARISON:  04/07/2022 FINDINGS: The heart is enlarged. There are bilateral pleural effusions, right greater than left. Vascular congestion. Multiple skin folds project over the right hemithorax. No pneumothorax. Remote sternal fracture. Thoracic spine degenerative change IMPRESSION: Cardiomegaly with vascular congestion and bilateral pleural effusions. Findings consistent with CHF. Electronically Signed   By: Narda Rutherford M.D.   On: 08/15/2022 19:35    EKG: My personal interpretation of EKG shows: afib    Assessment/Plan Active Problems:   Acute on chronic  diastolic CHF (congestive heart failure) (HCC)   PAF (paroxysmal atrial fibrillation) (HCC)    Assessment and Plan: Acute on chronic diastolic CHF (congestive heart failure) (HCC) Likely exacerbated from being on vacation last week and eating lots of extra salt due to eating at restaurants.  Patient does not normally take Lasix at home.  Is already feeling better after 40 mg of IV Lasix.  Wife and patient really did not want to be admitted to the hospital.  They state the patient's daughter is a Engineer, civil (consulting) here at [facility]  Daughter is on her way up to see them.  Patient's biggest complaint is really orthopnea.  He states that he wakes up in the middle of the night short of breath.  Will place him supine and see if he gets short of breath after getting IV Lasix.  May be able to treat him with just p.o. Lasix and have him follow-up with his PCP.  He can get his echo as an outpatient.  PAF (paroxysmal atrial fibrillation) (HCC) Chronic.  Patient remains on Eliquis.  Rate is controlled.   Family Communication: discussed with pt and wife  Disposition Plan: pt and wife want to talk to pt's  dtr who is an [employer] fundraiser. Potentially pt can be treated with po lasix and f/u with outpatient cardiology next week.    Carollee Herter, DO [facility] 08/15/2022, 9:38 PM

## 2022-08-15 NOTE — Assessment & Plan Note (Signed)
Likely exacerbated from being on vacation last week and eating lots of extra salt due to eating at restaurants.  Patient does not normally take Lasix at home.  Is already feeling better after 40 mg of IV Lasix.  Wife and patient really did not want to be admitted to the hospital.  They state the patient's daughter is a Engineer, civil (consulting) here at [facility].  Daughter is on her way up to see them.  Patient's biggest complaint is really orthopnea.  He states that he wakes up in the middle of the night short of breath.  Will place him supine and see if he gets short of breath after getting IV Lasix.  May be able to treat him with just p.o. Lasix and have him follow-up with his PCP.  He can get his echo as an outpatient.

## 2022-08-15 NOTE — ED Provider Notes (Signed)
Patient presents to urgent care for evaluation of shortness of breath for the last 3-4 days with new orthopnea for the last 2 days. Bilateral leg swelling has worsened over the last 1 month. Shortness of breath is worse with laying flat. No associated chest pain, heart palpitations, syncope, dizziness, N/V/D, fever/chills, or cough. History of CHF, atrial fibrillation (on eloquis), HTN, and type 2 diabetes. He does not currently take diuretic, states has had pleural effusions in the past but CHF has been well managed recently without use of diuretic.   BP elevated to 191/72, he has taken his medications today. This is change from baseline of 140s/80s at most recent visits.  Rales to bilateral lower lung fields, +2 pitting edema to bilateral feet, ankles, and calves.  EKG shows atrial fibrillation (history of PAF) without signs of new ST/T wave changes when compared to previous EKGs.   Given age, risk factors, history, and concern for CHF exacerbation, I would like for patient to go to the nearest emergency department for further workup and evaluation. Spoke on phone with patient's daughter, Dorita Fray, who encouraged parents to go to ER. Patient and his wife at bedside express understanding and agreement with plan. Vital signs stable, patient may go to ER via POV with his wife.    Carlisle Beers, [place] 08/15/22 2140

## 2022-08-15 NOTE — Progress Notes (Signed)
   Dtr scarlett arrived at hospital. Pt and dtr are comfortable with pt going home. Will continue lasix 40 mg qday x 3 days then stop. Will need outpatient f/u appointment with PCP and repeat BMP, Mag level. Will need outpatient echo.   Stable for discharge to home.  Carollee Herter, DO [facility]

## 2022-08-19 ENCOUNTER — Encounter: Payer: Self-pay | Admitting: Medical

## 2022-08-19 ENCOUNTER — Ambulatory Visit (INDEPENDENT_AMBULATORY_CARE_PROVIDER_SITE_OTHER): Payer: Medicare HMO | Admitting: Medical

## 2022-08-19 VITALS — BP 140/42 | HR 70 | Ht 71.0 in | Wt 137.6 lb

## 2022-08-19 DIAGNOSIS — I5033 Acute on chronic diastolic (congestive) heart failure: Secondary | ICD-10-CM

## 2022-08-19 DIAGNOSIS — R252 Cramp and spasm: Secondary | ICD-10-CM | POA: Diagnosis not present

## 2022-08-19 DIAGNOSIS — I480 Paroxysmal atrial fibrillation: Secondary | ICD-10-CM | POA: Diagnosis not present

## 2022-08-19 DIAGNOSIS — I10 Essential (primary) hypertension: Secondary | ICD-10-CM | POA: Diagnosis not present

## 2022-08-19 NOTE — Progress Notes (Unsigned)
Subjective:    Patient ID: [name], male    DOB: [birth year], 89 y.o.   MRN: [id number]  HPI  Pt seem in ED on 08-15-2022.   [name] is a 89 y.o. male.   HPI   Patient is a 89 year old male with past medical history of labile hypertension, cardiovascular disease status post left CEA 2002, hyperlipidemia, GERD, reactive airways disease, esophageal stricture, and paroxysmal atrial fibrillation, presenting today for shortness of breath.  He states he has had worsening difficulty breathing over the last 3 to 4 days.  He notes that last night he had to sleep in a recliner as he has increased shortness of breath with lying flat in the bed.  He denies any fevers or cough.  He has no hemoptysis.  He has had increased lower extremity edema for the last 1 month.  He states it improves after lying in the bed overnight, but worsens throughout the day.  He denies any chest pain.  He has no nausea, vomiting, or diarrhea.  He denies taking any diuretics at home.   [name] is a 89 y.o. male.   HPI   Patient is a 89 year old male with past medical history of labile hypertension, cardiovascular disease status post left CEA 2002, hyperlipidemia, GERD, reactive airways disease, esophageal stricture, and paroxysmal atrial fibrillation, presenting today for shortness of breath.  He states he has had worsening difficulty breathing over the last 3 to 4 days.  He notes that last night he had to sleep in a recliner as he has increased shortness of breath with lying flat in the bed.  He denies any fevers or cough.  He has no hemoptysis.  He has had increased lower extremity edema for the last 1 month.  He states it improves after lying in the bed overnight, but worsens throughout the day.  He denies any chest pain.  He has no nausea, vomiting, or diarrhea.  He denies taking any diuretics at home.   Pt had conversation about admission. Pt did not want to be admitted. And per pt and wife he was not  strongly advised to stay.  Pt given lasix to take home. On review appear ED did troponin. 2 sets and was flat. Some cramping of calf muscles and mild fatigue.    Pt breatihng a lot better since treatment but still mild sob. He is sleeping in his recliner.  Less lower ext edema. But still some per wife.  Pt given lasix 40 mg daily x 3 days then to take as needed.   Review of Systems  Constitutional:  Positive for fatigue. Negative for chills and diaphoresis.  Respiratory:  Negative for cough, chest tightness, shortness of breath and wheezing.   Cardiovascular:  Negative for chest pain and palpitations.  Gastrointestinal:  Negative for abdominal pain, nausea and vomiting.  Musculoskeletal:  Negative for back pain and joint swelling.  Neurological:  Negative for dizziness, seizures, syncope, weakness and light-headedness.  Hematological:  Negative for adenopathy. Does not bruise/bleed easily.  Psychiatric/Behavioral:  Negative for behavioral problems and confusion.    Past Medical History:  Diagnosis Date   Acute bronchitis 06/10/2015   Allergic rhinitis    Anemia    Asthma    Carotid stenosis    a. s/p Left CEA 2002;  b. Carotid US 3/16:  patent R CEA, L < 40%   Chronic pain syndrome    Left shoulder, back   Closed fracture of neck of right  femur (HCC)    Diverticulosis    Dyslipidemia    Epigastric pain 04/05/2016   Esophageal stricture    Distal, benign   Femur fracture, left (HCC) 10/27/2018   GERD (gastroesophageal reflux disease)    Hemorrhoids    HTN (hypertension)    Negative renal duplex 07-22-11   Hx of echocardiogram    a. Echo 10/11: mod LVH, EF 55-60%   Pleural effusion 07/17/2020   Prostate cancer (HCC) 2000   Seed XRT   S/P thoracentesis    Spondylosis, cervical, with myelopathy 11/26/2015   Stroke (HCC) 08/1999   right thalamic - on chronic Plavix/ASA     Social History   Socioeconomic History   Marital status: Married    Spouse name: Not on file    Number of children: 1   Years of education: 10   Highest education level: Not on file  Occupational History   Occupation: Retired  Tobacco Use   Smoking status: Former    Current packs/day: 0.00    Average packs/day: 0.3 packs/day for 35.0 years (8.8 ttl pk-yrs)    Types: Cigarettes    Start date: 42    Quit date: 01/12/1978    Years since quitting: 44.6   Smokeless tobacco: Current    Types: Chew   Tobacco comments:    Started smoking at age 60  Vaping Use   Vaping status: Never Used  Substance and Sexual Activity   Alcohol use: No    Comment: Prior alcoholic, sober since 2002   Drug use: No   Sexual activity: Not Currently  Other Topics Concern   Not on file  Social History Narrative   Retired lives at home w/ his wife   1 daughter   Right-handed    Caffeine: drinks a lot of coffee    Former smoker, former alcoholic abstinent for many years   Social Determinants of Corporate investment banker Strain: Low Risk  (05/15/2021)   Overall Financial Resource Strain (CARDIA)    Difficulty of Paying Living Expenses: Not hard at all  Food Insecurity: No Food Insecurity (05/22/2022)   Hunger Vital Sign    Worried About Running Out of Food in the Last Year: Never true    Ran Out of Food in the Last Year: Never true  Transportation Needs: No Transportation Needs (05/22/2022)   PRAPARE - Administrator, Civil Service (Medical): No    Lack of Transportation (Non-Medical): No  Physical Activity: Insufficiently Active (05/15/2021)   Exercise Vital Sign    Days of Exercise per Week: 1 day    Minutes of Exercise per Session: 50 min  Stress: No Stress Concern Present (05/15/2021)   [employer] of Occupational Health - Occupational Stress Questionnaire    Feeling of Stress : Not at all  Social Connections: Socially Integrated (05/15/2021)   Social Connection and Isolation Panel [NHANES]    Frequency of Communication with Friends and Family: More than three times a week     Frequency of Social Gatherings with Friends and Family: More than three times a week    Attends Religious Services: More than 4 times per year    Active Member of [employer] or Organizations: Yes    Attends Banker Meetings: More than 4 times per year    Marital Status: Married  Catering manager Violence: Not At Risk (05/15/2021)   Humiliation, Afraid, Rape, and Kick questionnaire    Fear of Current or Ex-Partner: No  Emotionally Abused: No    Physically Abused: No    Sexually Abused: No    Past Surgical History:  Procedure Laterality Date   ANTERIOR APPROACH HEMI HIP ARTHROPLASTY Right 03/15/2021   Procedure: ANTERIOR APPROACH HEMI HIP ARTHROPLASTY;  Surgeon: Samson Frederic, MD;  Location: [facility] OR;  Service: Orthopedics;  Laterality: Right;   APPENDECTOMY  1953   CAROTID ENDARTERECTOMY Right 01/2000   CATARACT EXTRACTION Bilateral    CERVICAL DISC SURGERY  8/07   CHEST TUBE INSERTION Left 11/11/2020   Procedure: INSERTION PLEURAL DRAINAGE CATHETER;  Surgeon: Josephine Igo, DO;  Location: [facility] ENDOSCOPY;  Service: Pulmonary;  Laterality: Left;  Indwelling Pleural Catheter   HIP ARTHROPLASTY Left 10/24/2018   Procedure: ARTHROPLASTY BIPOLAR HIP (HEMIARTHROPLASTY);  Surgeon: Durene Romans, MD;  Location: [facility] OR;  Service: Orthopedics;  Laterality: Left;   INSERTION PROSTATE RADIATION SEED  2000   LUMBAR DISC SURGERY  1990s   NASAL SINUS SURGERY     x 3   REMOVAL OF PLEURAL DRAINAGE CATHETER N/A 12/24/2020   Procedure: MINOR REMOVAL OF PLEURAL DRAINAGE CATHETER;  Surgeon: Josephine Igo, DO;  Location: [facility];  Service: Cardiopulmonary;  Laterality: N/A;   THORACENTESIS N/A 08/15/2020   Procedure: THORACENTESIS;  Surgeon: Josephine Igo, DO;  Location: [facility];  Service: Pulmonary;  Laterality: N/A;    Family History  Problem Relation Age of Onset   Stroke Brother    Stroke Sister    Stroke Mother    Hyperlipidemia Mother    Hypertension Mother    Lung  disease Father    Diabetes Son    Gout Son    Obesity Sister    Alcohol abuse Brother    Cancer Brother        lung, smoker    Allergies  Allergen Reactions   Hctz [Hydrochlorothiazide] Shortness Of Breath, Swelling and Other (See Comments)    "Swelling and dyspnea"    Hydralazine Shortness Of Breath and Other (See Comments)    Chest pain and GI issues also   Diovan [Valsartan] Other (See Comments)    Elevated potassium- Hyperkalemia   Metformin And Related Other (See Comments)    Reaction not recalled   Nsaids Other (See Comments)    Other than Tylenol, he isn't to have these because he's on Eliquis   Other Other (See Comments)    Clonidine B/P patch = Caused redness   Prednisone Other (See Comments)    Suicidal thoughts   Spironolactone Swelling    Site of swelling not recalled   Codeine Itching and Nausea Only   Oxycodone-Acetaminophen Itching and Nausea Only    Current Outpatient Medications on File Prior to Visit  Medication Sig Dispense Refill   acetaminophen (TYLENOL) 500 MG tablet Take 1,000 mg by mouth every 6 (six) hours as needed for mild pain or headache.     albuterol (VENTOLIN HFA) 108 (90 Base) MCG/ACT inhaler Inhale 1-2 puffs into the lungs every 6 (six) hours as needed for wheezing or shortness of breath. 18 g 1   amLODipine (NORVASC) 5 MG tablet Take 2 tablets (10 mg total) by mouth daily (need office visit) 180 tablet 1   apixaban (ELIQUIS) 5 MG TABS tablet Take 1 tablet (5 mg) by mouth 2 times daily. 180 tablet 3   atorvastatin (LIPITOR) 40 MG tablet Take 1 tablet (40 mg total) by mouth daily. 90 tablet 0   Cholecalciferol (VITAMIN D3 PO) Take 1 tablet by mouth in the morning. Unsure of  how many units     docusate sodium (COLACE) 100 MG capsule Take 300 mg by mouth 2 (two) times daily.     Ferrous Fumarate (HEMOCYTE - 106 MG FE) 324 (106 Fe) MG TABS tablet Take 1 tablet (106 mg of iron total) by mouth 2 times daily. 30 tablet 2   furosemide (LASIX) 40 MG  tablet 40 mg qday x 3 days. Then prn SOB/LE edema. 30 tablet 0   gabapentin (NEURONTIN) 300 MG capsule Take 1 capsule (300 mg total) by mouth 3 (three) times daily. (Patient taking differently: Take 300 mg by mouth at bedtime.) 270 capsule 1   lisinopril (ZESTRIL) 5 MG tablet Take 1 tablet (5 mg) by mouth daily. 90 tablet 3   metoCLOPramide (REGLAN) 5 MG tablet Take 1 tablet (5 mg total) by mouth at bedtime. Call for appointment for future refills. 30 tablet 3   minoxidil (LONITEN) 10 MG tablet Take 1 tablet (10 mg) by mouth 2 times daily. 180 tablet 3   ondansetron (ZOFRAN ODT) 8 MG disintegrating tablet Dissolve 1 tablet by mouth every 8 hours as needed for nausea or vomiting. 20 tablet 0   polyvinyl alcohol (LIQUIFILM TEARS) 1.4 % ophthalmic solution Place 1 drop into both eyes 5 (five) times daily as needed for dry eyes.     sennosides-docusate sodium (SENOKOT-S) 8.6-50 MG tablet Take 1-2 tablets by mouth See admin instructions. Take 1 tablet in the morning & take 2 tablet at night     traMADol (ULTRAM) 50 MG tablet Take 1 tablet (50 mg) by mouth 3 times daily as needed. 90 tablet 0   triamcinolone cream (KENALOG) 0.1 % Apply topically to affected areas of the skin daily as needed. 454 g 1   No current facility-administered medications on file prior to visit.    BP (!) 140/42   Pulse 70   Ht 5\' 11"  (1.803 m)   Wt 137 lb 9.6 oz (62.4 kg)   SpO2 95%   BMI 19.19 kg/m        Objective:   Physical Exam  General Mental Status- Alert. General Appearance- Not in acute distress.   Skin General: Color- Normal Color. Moisture- Normal Moisture.  Neck Carotid Arteries- Normal color. Moisture- Normal Moisture. No carotid bruits. No JVD.  Chest and Lung Exam Auscultation: Breath Sounds:-Normal.  Cardiovascular Auscultation:Rythm- Regular. Murmurs & Other Heart Sounds:Auscultation of the heart reveals- No Murmurs.  Abdomen Inspection:-Inspeection Normal. Palpation/Percussion:Note:No  mass. Palpation and Percussion of the abdomen reveal- Non Tender, Non Distended + BS, no rebound or guarding.   Neurologic Cranial Nerve exam:- CN III-XII intact(No nystagmus), symmetric smile. Strength:- 5/5 equal and symmetric strength both upper and lower extremities.       Assessment & Plan:   Patient Instructions  1. PAF (paroxysmal atrial fibrillation) (HCC) Rate controlled. Continue current cardiac meds as we discussed today. - Comp Met (CMET); Future - B Nat Peptide; Future - Ambulatory referral to Cardiology  2. Essential hypertension - Bp reasonably controlled today.   3. Acute on chronic diastolic CHF (congestive heart failure) (HCC) overall appears clinically stable and some improved since Saturday. -take lasix today when you get home. Labs and cxr tomorrow as our lab was closed by time I saw you.  - DG Chest 2 View; Future - Comp Met (CMET); Future - B Nat Peptide; Future - Ambulatory referral to Cardiology to follow up as looks like not seen for some time. I think would benefit from echo.  4. Cramp in  muscle - Magnesium; Future   Follow up date with our office to be determined after lab review. Keep regular scheduled appointment with pcp as well.    Esperanza Richters, PA-C   Time spent with patient today was  51 minutes which consisted of chart revdew, discussing diagnosis, work up, treatment and documentation.

## 2022-08-20 ENCOUNTER — Ambulatory Visit (HOSPITAL_BASED_OUTPATIENT_CLINIC_OR_DEPARTMENT_OTHER)
Admission: RE | Admit: 2022-08-20 | Discharge: 2022-08-20 | Disposition: A | Discharge status: Home / Self Care | Payer: Medicare HMO | Source: Ambulatory Visit | Attending: Medical | Admitting: Medical

## 2022-08-20 ENCOUNTER — Other Ambulatory Visit (INDEPENDENT_AMBULATORY_CARE_PROVIDER_SITE_OTHER): Payer: Medicare HMO

## 2022-08-20 DIAGNOSIS — R252 Cramp and spasm: Secondary | ICD-10-CM | POA: Diagnosis not present

## 2022-08-20 DIAGNOSIS — I5033 Acute on chronic diastolic (congestive) heart failure: Secondary | ICD-10-CM

## 2022-08-20 DIAGNOSIS — J984 Other disorders of lung: Secondary | ICD-10-CM | POA: Diagnosis not present

## 2022-08-20 DIAGNOSIS — J90 Pleural effusion, not elsewhere classified: Secondary | ICD-10-CM | POA: Diagnosis not present

## 2022-08-20 DIAGNOSIS — R0602 Shortness of breath: Secondary | ICD-10-CM | POA: Diagnosis not present

## 2022-08-20 DIAGNOSIS — I480 Paroxysmal atrial fibrillation: Secondary | ICD-10-CM | POA: Diagnosis not present

## 2022-08-20 DIAGNOSIS — R918 Other nonspecific abnormal finding of lung field: Secondary | ICD-10-CM | POA: Diagnosis not present

## 2022-08-20 LAB — COMPREHENSIVE METABOLIC PANEL
AG Ratio: 1.3 (calc) (ref 1.0–2.5)
ALT: 4 U/L — ABNORMAL LOW (ref 9–46)
AST: 11 U/L (ref 10–35)
Albumin: 3.6 g/dL (ref 3.6–5.1)
Alkaline phosphatase (APISO): 59 U/L (ref 35–144)
BUN/Creatinine Ratio: 22 (calc) (ref 6–22)
BUN: 27 mg/dL — ABNORMAL HIGH (ref 7–25)
CO2: 32 mmol/L (ref 20–32)
Calcium: 8.9 mg/dL (ref 8.6–10.3)
Chloride: 104 mmol/L (ref 98–110)
Creat: 1.21 mg/dL (ref 0.70–1.22)
Globulin: 2.7 g/dL (calc) (ref 1.9–3.7)
Glucose, Bld: 91 mg/dL (ref 65–99)
Potassium: 4.2 mmol/L (ref 3.5–5.3)
Sodium: 143 mmol/L (ref 135–146)
Total Bilirubin: 0.4 mg/dL (ref 0.2–1.2)
Total Protein: 6.3 g/dL (ref 6.1–8.1)

## 2022-08-20 LAB — MAGNESIUM: Magnesium: 1.9 mg/dL (ref 1.5–2.5)

## 2022-08-20 LAB — BRAIN NATRIURETIC PEPTIDE: Brain Natriuretic Peptide: 281 pg/mL — ABNORMAL HIGH (ref <100)

## 2022-08-20 NOTE — Patient Instructions (Addendum)
1. PAF (paroxysmal atrial fibrillation) (HCC) Rate controlled. Continue current cardiac meds as we discussed today. - Comp Met (CMET); Future - B Nat Peptide; Future - Ambulatory referral to Cardiology  2. Essential hypertension - Bp reasonably controlled today.   3. Acute on chronic diastolic CHF (congestive heart failure) (HCC) overall appears clinically stable and some improved since Saturday. -take lasix today when you get home. Labs and cxr tomorrow as our lab was closed by time I saw you.  - DG Chest 2 View; Future - Comp Met (CMET); Future - B Nat Peptide; Future - Ambulatory referral to Cardiology to follow up as looks like not seen for some time. I think would benefit from echo.  4. Cramp in muscle - Magnesium; Future   Follow up date with our office to be determined after lab review. Keep regular scheduled appointment with pcp as well.

## 2022-08-20 NOTE — Addendum Note (Signed)
Addended by: Mervin Kung A on: 08/20/2022 01:22 PM   Modules accepted: Orders

## 2022-08-21 ENCOUNTER — Telehealth: Payer: Self-pay

## 2022-08-21 ENCOUNTER — Telehealth: Payer: Self-pay | Admitting: Family Medicine

## 2022-08-21 NOTE — Telephone Encounter (Signed)
Patient is unable to use mychart so wife requesting a call back to advise results of chest xray and labs. Please call to advise

## 2022-08-21 NOTE — Telephone Encounter (Signed)
Transition Care Management Unsuccessful Follow-up Telephone Call  Date of discharge and from where:  08/15/2022 [facility]  Attempts:  1st Attempt  Reason for unsuccessful TCM follow-up call:  No answer/busy  Favian Kittleson [facility]  [facility] Population Health Community Resource Care Guide   [email or web address]  ?? [phone]   Website: [email or web address]  [email or web address]

## 2022-08-21 NOTE — Telephone Encounter (Signed)
Pt called back and appt made with PCP 08/27/22

## 2022-08-24 ENCOUNTER — Telehealth: Payer: Self-pay

## 2022-08-24 NOTE — Telephone Encounter (Signed)
Transition Care Management Unsuccessful Follow-up Telephone Call  Date of discharge and from where:  08/15/2022 [facility]  Attempts:  2nd Attempt  Reason for unsuccessful TCM follow-up call:  Left voice message   [facility]  [facility] Population Health Community Resource Care Guide   [email or web address]  ?? [phone]   Website: [email or web address]  [email or web address]

## 2022-08-24 NOTE — Telephone Encounter (Signed)
Spoke with scarlett

## 2022-08-24 NOTE — Telephone Encounter (Signed)
Dorita Fray (daughter DPR OK) called wanting to get clarification on pt's labs particularly on his BNP levels. All CMAs were unavailable and advised a note would be sent back to call her later.

## 2022-08-26 NOTE — Assessment & Plan Note (Signed)
Well controlled, no changes to meds. Encouraged heart healthy diet such as the DASH diet and exercise as tolerated.  °

## 2022-08-26 NOTE — Assessment & Plan Note (Signed)
Supplement and monitor 

## 2022-08-26 NOTE — Assessment & Plan Note (Signed)
Recent exacerbation, will repeat BNP today and adjust treatment accordingly

## 2022-08-26 NOTE — Assessment & Plan Note (Signed)
hgba1c acceptable, minimize simple carbs. Increase exercise as tolerated. Continue current meds 

## 2022-08-26 NOTE — Assessment & Plan Note (Addendum)
Asymptomatic and follows with cardiology, tolerating meds

## 2022-08-27 ENCOUNTER — Other Ambulatory Visit (HOSPITAL_COMMUNITY): Payer: Self-pay

## 2022-08-27 ENCOUNTER — Ambulatory Visit (INDEPENDENT_AMBULATORY_CARE_PROVIDER_SITE_OTHER): Payer: Medicare HMO | Admitting: Family Medicine

## 2022-08-27 ENCOUNTER — Encounter: Payer: Self-pay | Admitting: Family Medicine

## 2022-08-27 VITALS — BP 132/66 | HR 66 | Temp 98.1°F | Ht 71.0 in | Wt 138.2 lb

## 2022-08-27 DIAGNOSIS — E1165 Type 2 diabetes mellitus with hyperglycemia: Secondary | ICD-10-CM | POA: Diagnosis not present

## 2022-08-27 DIAGNOSIS — R600 Localized edema: Secondary | ICD-10-CM | POA: Diagnosis not present

## 2022-08-27 DIAGNOSIS — I480 Paroxysmal atrial fibrillation: Secondary | ICD-10-CM

## 2022-08-27 DIAGNOSIS — E559 Vitamin D deficiency, unspecified: Secondary | ICD-10-CM | POA: Diagnosis not present

## 2022-08-27 DIAGNOSIS — I10 Essential (primary) hypertension: Secondary | ICD-10-CM

## 2022-08-27 DIAGNOSIS — I5033 Acute on chronic diastolic (congestive) heart failure: Secondary | ICD-10-CM

## 2022-08-27 DIAGNOSIS — E8809 Other disorders of plasma-protein metabolism, not elsewhere classified: Secondary | ICD-10-CM

## 2022-08-27 DIAGNOSIS — I5032 Chronic diastolic (congestive) heart failure: Secondary | ICD-10-CM | POA: Diagnosis not present

## 2022-08-27 DIAGNOSIS — D509 Iron deficiency anemia, unspecified: Secondary | ICD-10-CM | POA: Diagnosis not present

## 2022-08-27 DIAGNOSIS — R0602 Shortness of breath: Secondary | ICD-10-CM

## 2022-08-27 DIAGNOSIS — N189 Chronic kidney disease, unspecified: Secondary | ICD-10-CM

## 2022-08-27 DIAGNOSIS — E782 Mixed hyperlipidemia: Secondary | ICD-10-CM

## 2022-08-27 MED ORDER — FUROSEMIDE 20 MG PO TABS
20.0000 mg | ORAL_TABLET | Freq: Every day | ORAL | 3 refills | Status: DC | PRN
  Filled 2022-08-27: qty 60, 30d supply, fill #0

## 2022-08-27 NOTE — Patient Instructions (Signed)
Heart Failure Eating Plan Heart failure, also called congestive heart failure, occurs when your heart does not pump blood well enough to meet your body's needs for oxygen-rich blood. Heart failure is a long-term (chronic) condition. Living with heart failure can be challenging. Following your health care provider's instructions about a healthy lifestyle and working with a dietitian to choose the right foods may help to improve your symptoms. An eating plan for someone with heart failure will include changes that limit the intake of salt (sodium) and unhealthy fat. What are tips for following this plan? Reading food labels Check food labels for the amount of sodium per serving. Choose foods that have less than 140 mg (milligrams) of sodium in each serving. Check food labels for the number of calories per serving. This is important if you need to limit your daily calorie intake to lose weight. Check food labels for the serving size. If you eat more than one serving, you will be eating more sodium and calories than what is listed on the label. Look for foods that are labeled as "sodium-free," "very low sodium," or "low sodium." Foods labeled as "reduced sodium" or "lightly salted" may still have more sodium than what is recommended for you. Cooking Avoid adding salt when cooking. Ask your health care provider or dietitian before using salt substitutes. Season food with salt-free seasonings, spices, or herbs. Check the label of seasoning mixes to make sure they do not contain salt. Cook with heart-healthy oils, such as olive, canola, soybean, or sunflower oil. Do not fry foods. Cook foods using low-fat methods, such as baking, boiling, grilling, and broiling. Limit unhealthy fats when cooking by: Removing the skin from poultry, such as chicken. Removing all visible fats from meats. Skimming the fat off from stews, soups, and gravies before serving them. Meal planning  Limit your intake  of: Processed, canned, or prepackaged foods. Foods that are high in trans fat, such as fried foods. Sweets, desserts, sugary drinks, and other foods with added sugar. Full-fat dairy products, such as whole milk. Eat a balanced diet. This may include: 4-5 servings of fruit each day and 4-5 servings of vegetables each day. At each meal, try to fill one-half of your plate with fruits and vegetables. Up to 6-8 servings of whole grains each day. Up to 2 servings of lean meat, poultry, or fish each day. One serving of meat is equal to 3 oz (85 g). This is about the same size as a deck of cards. 2 servings of low-fat dairy each day. Heart-healthy fats. Healthy fats called omega-3 fatty acids are found in foods such as flaxseed and cold-water fish like sardines, salmon, and mackerel. Aim to eat 25-35 g (grams) of fiber a day. Foods that are high in fiber include apples, broccoli, carrots, beans, peas, and whole grains. Do not add salt or condiments that contain salt (such as soy sauce) to foods before eating. When eating at a restaurant, ask that your food be prepared with less salt or no salt, if possible. Try to eat 2 or more vegetarian meals each week. Eat more home-cooked food and eat less restaurant, buffet, and fast food. General information Do not eat more than 2,300 mg of sodium a day. The amount of sodium that is recommended for you may be lower, depending on your condition. Maintain a healthy body weight as directed. Ask your health care provider what a healthy weight is for you. Check your weight every day. Work with your health care provider   and dietitian to make a plan that is right for you to lose weight or maintain your current weight. Limit how much fluid you drink. Ask your health care provider or dietitian how much fluid you can have each day. Limit or avoid alcohol as told by your health care provider or dietitian. Recommended foods Fruits All fresh, frozen, and canned fruits.  Dried fruits, such as raisins, prunes, and cranberries. Vegetables All fresh vegetables. Vegetables that are frozen without sauce or added salt. Low-sodium or sodium-free canned vegetables. Grains Bread with less than 80 mg of sodium per slice. Whole-wheat pasta, quinoa, and brown rice. Oats and oatmeal. Barley. Millet. Grits and cream of wheat. Whole-grain and whole-wheat cold cereal. Meats and other protein foods Lean cuts of meat. Skinless chicken and turkey. Fish with high omega-3 fatty acids, such as salmon, sardines, and other cold-water fishes. Eggs. Dried beans, peas, and edamame. Unsalted nuts and nut butters. Dairy Low-fat or nonfat (skim) milk and dried milk. Rice milk, soy milk, and almond milk. Low-fat or nonfat yogurt. Small amounts of reduced-sodium block cheese. Low-sodium cottage cheese. Fats and oils Olive, canola, soybean, flaxseed, avocado, or sunflower oil. Sweets and desserts Applesauce. Granola bars. Sugar-free pudding and gelatin. Frozen fruit bars. Seasoning and other foods Fresh and dried herbs. Lemon or lime juice. Vinegar. Low-sodium ketchup. Salt-free marinades, salad dressings, sauces, and seasonings. The items listed above may not be a complete list of foods and beverages you can eat. Contact a dietitian for more information. Foods to avoid Fruits Fruits that are dried with sodium-containing preservatives. Vegetables Canned vegetables. Frozen vegetables with sauce or seasonings. Creamed vegetables. French fries. Onion rings. Pickled vegetables and sauerkraut. Grains Bread with more than 80 mg of sodium per slice. Hot or cold cereal with more than 140 mg sodium per serving. Salted pretzels and crackers. Prepackaged breadcrumbs. Bagels, croissants, and biscuits. Meats and other protein foods Ribs and chicken wings. Bacon, ham, pepperoni, bologna, salami, and packaged luncheon meats. Hot dogs, bratwurst, and sausage. Canned meat. Smoked meat and fish. Salted nuts  and seeds. Dairy Whole milk, half-and-half, and cream. Buttermilk. Processed cheese, cheese spreads, and cheese curds. Regular cottage cheese. Feta cheese. Shredded cheese. String cheese. Fats and oils Butter, lard, shortening, ghee, and bacon fat. Canned and packaged gravies. Seasoning and other foods Onion salt, garlic salt, table salt, and sea salt. Marinades. Regular salad dressings. Relishes, pickles, and olives. Meat flavorings and tenderizers, and bouillon cubes. Horseradish, ketchup, and mustard. Worcestershire sauce. Teriyaki sauce, soy sauce (including reduced sodium). Hot sauce and Tabasco sauce. Steak sauce, fish sauce, oyster sauce, and cocktail sauce. Taco seasonings. Barbecue sauce. Tartar sauce. The items listed above may not be a complete list of foods and beverages you should avoid. Contact a dietitian for more information. Summary A heart failure eating plan includes changes that limit your intake of sodium and unhealthy fat, and it may help you lose weight or maintain a healthy weight. Your health care provider may also recommend limiting how much fluid you drink. Most people with heart failure should eat no more than 2,300 mg of salt (sodium) a day. The amount of sodium that is recommended for you may be lower, depending on your condition. Contact your health care provider or dietitian before making any major changes to your diet. This information is not intended to replace advice given to you by your health care provider. Make sure you discuss any questions you have with your health care provider. Document Revised: 04/08/2021 Document Reviewed: 08/14/2019   Elsevier Patient Education  2024 Elsevier Inc.  

## 2022-08-28 ENCOUNTER — Telehealth: Payer: Self-pay | Admitting: Internal Medicine

## 2022-08-28 ENCOUNTER — Other Ambulatory Visit: Payer: Self-pay

## 2022-08-28 ENCOUNTER — Other Ambulatory Visit (INDEPENDENT_AMBULATORY_CARE_PROVIDER_SITE_OTHER): Payer: Medicare HMO

## 2022-08-28 ENCOUNTER — Other Ambulatory Visit: Payer: Self-pay | Admitting: *Deleted

## 2022-08-28 DIAGNOSIS — D649 Anemia, unspecified: Secondary | ICD-10-CM

## 2022-08-28 DIAGNOSIS — R7989 Other specified abnormal findings of blood chemistry: Secondary | ICD-10-CM | POA: Diagnosis not present

## 2022-08-28 LAB — LIPID PANEL
Cholesterol: 117 mg/dL (ref 0–200)
HDL: 48.9 mg/dL (ref >39.00)
LDL Cholesterol: 56 mg/dL (ref 0–99)
NonHDL: 67.91
Total CHOL/HDL Ratio: 2
Triglycerides: 59 mg/dL (ref 0.0–149.0)
VLDL: 11.8 mg/dL (ref 0.0–40.0)

## 2022-08-28 LAB — CBC WITH DIFFERENTIAL/PLATELET
Basophils Absolute: 0.1 10*3/uL (ref 0.0–0.1)
Basophils Relative: 0.7 % (ref 0.0–3.0)
Eosinophils Absolute: 0.2 10*3/uL (ref 0.0–0.7)
Eosinophils Relative: 1.8 % (ref 0.0–5.0)
HCT: 29.9 % — ABNORMAL LOW (ref 39.0–52.0)
Hemoglobin: 10 g/dL — ABNORMAL LOW (ref 13.0–17.0)
Lymphocytes Relative: 10.7 % — ABNORMAL LOW (ref 12.0–46.0)
Lymphs Abs: 0.9 10*3/uL (ref 0.7–4.0)
MCHC: 33.4 g/dL (ref 30.0–36.0)
MCV: 91.9 fl (ref 78.0–100.0)
Monocytes Absolute: 0.7 10*3/uL (ref 0.1–1.0)
Monocytes Relative: 7.9 % (ref 3.0–12.0)
Neutro Abs: 7 10*3/uL (ref 1.4–7.7)
Neutrophils Relative %: 78.9 % — ABNORMAL HIGH (ref 43.0–77.0)
Platelets: 472 10*3/uL — ABNORMAL HIGH (ref 150.0–400.0)
RBC: 3.25 Mil/uL — ABNORMAL LOW (ref 4.22–5.81)
RDW: 15.6 % — ABNORMAL HIGH (ref 11.5–15.5)
WBC: 8.8 10*3/uL (ref 4.0–10.5)

## 2022-08-28 LAB — COMPREHENSIVE METABOLIC PANEL
ALT: 4 U/L (ref 0–53)
AST: 11 U/L (ref 0–37)
Albumin: 3.5 g/dL (ref 3.5–5.2)
Alkaline Phosphatase: 57 U/L (ref 39–117)
BUN: 24 mg/dL — ABNORMAL HIGH (ref 6–23)
CO2: 30 meq/L (ref 19–32)
Calcium: 8.8 mg/dL (ref 8.4–10.5)
Chloride: 99 meq/L (ref 96–112)
Creatinine, Ser: 1.15 mg/dL (ref 0.40–1.50)
GFR: 55.83 mL/min — ABNORMAL LOW (ref >60.00)
Glucose, Bld: 104 mg/dL — ABNORMAL HIGH (ref 70–99)
Potassium: 4.5 meq/L (ref 3.5–5.1)
Sodium: 135 meq/L (ref 135–145)
Total Bilirubin: 0.4 mg/dL (ref 0.2–1.2)
Total Protein: 6 g/dL (ref 6.0–8.3)

## 2022-08-28 LAB — HEMOGLOBIN A1C: Hgb A1c MFr Bld: 5.6 % (ref 4.6–6.5)

## 2022-08-28 LAB — TSH: TSH: 7.46 u[IU]/mL — ABNORMAL HIGH (ref 0.35–5.50)

## 2022-08-28 LAB — IRON,TIBC AND FERRITIN PANEL
%SAT: 20 % (ref 20–48)
Ferritin: 67 ng/mL (ref 24–380)
Iron: 41 ug/dL — ABNORMAL LOW (ref 50–180)
TIBC: 203 ug/dL — ABNORMAL LOW (ref 250–425)

## 2022-08-28 LAB — BRAIN NATRIURETIC PEPTIDE: Pro B Natriuretic peptide (BNP): 153 pg/mL — ABNORMAL HIGH (ref 0.0–100.0)

## 2022-08-28 LAB — VITAMIN D 25 HYDROXY (VIT D DEFICIENCY, FRACTURES): VITD: 48.41 ng/mL (ref 30.00–100.00)

## 2022-08-28 LAB — T4, FREE: Free T4: 1.01 ng/dL (ref 0.60–1.60)

## 2022-08-28 NOTE — Telephone Encounter (Signed)
[name] calling to try and get the pt a sooner appointment. Please advise.

## 2022-08-28 NOTE — Telephone Encounter (Signed)
Spoke with pts wife. Agree with scheduling, I could not find a earlier appointment for his needs. Told Pts wife I would forward to Dr. Tenny Craw (whom they would prefer to see) and her nurse to see if they can be worked into the schedule before scheduled appointment. Pts wife understood.

## 2022-08-30 DIAGNOSIS — N189 Chronic kidney disease, unspecified: Secondary | ICD-10-CM | POA: Insufficient documentation

## 2022-08-30 NOTE — Assessment & Plan Note (Signed)
Stay as active as able. Elevate feet above heart for 15 minutes twice a day. Furosemide daily and second dose prn.

## 2022-08-30 NOTE — Progress Notes (Signed)
Subjective:    Patient ID: [name], male    DOB: 1929/04/10, 89 y.o.   MRN: [id number]  Chief Complaint  Patient presents with   Follow-up   Foot Swelling    Left foot    Shortness of Breath    When walking short and/or small distances     HPI Patient is in today for follow up on chronic medical concerns. He is accompanied by his wife. They note his pedal edema is improving but not completely resolved. He continues to struggle shortness of breath but it is his baseline and not worsening. No recent febrile illness. He continues to have pedal edema but it is improved. Denies CP/palp/HA/congestion/fevers/GI or GU c/o. Taking meds as prescribed   Past Medical History:  Diagnosis Date   Acute bronchitis 06/10/2015   Allergic rhinitis    Anemia    Asthma    Carotid stenosis    a. s/p Left CEA 2002;  b. Carotid US 3/16:  patent R CEA, L < 40%   Chronic pain syndrome    Left shoulder, back   Closed fracture of neck of right femur (HCC)    Diverticulosis    Dyslipidemia    Epigastric pain 04/05/2016   Esophageal stricture    Distal, benign   Femur fracture, left (HCC) 10/27/2018   GERD (gastroesophageal reflux disease)    Hemorrhoids    HTN (hypertension)    Negative renal duplex 07-22-11   Hx of echocardiogram    a. Echo 10/11: mod LVH, EF 55-60%   Pleural effusion 07/17/2020   Prostate cancer (HCC) 2000   Seed XRT   S/P thoracentesis    Spondylosis, cervical, with myelopathy 11/26/2015   Stroke (HCC) 08/1999   right thalamic - on chronic Plavix/ASA    Past Surgical History:  Procedure Laterality Date   ANTERIOR APPROACH HEMI HIP ARTHROPLASTY Right 03/15/2021   Procedure: ANTERIOR APPROACH HEMI HIP ARTHROPLASTY;  Surgeon: Samson Frederic, MD;  Location: [facility] OR;  Service: Orthopedics;  Laterality: Right;   APPENDECTOMY  1953   CAROTID ENDARTERECTOMY Right 01/2000   CATARACT EXTRACTION Bilateral    CERVICAL DISC SURGERY  8/07   CHEST TUBE INSERTION Left 11/11/2020    Procedure: INSERTION PLEURAL DRAINAGE CATHETER;  Surgeon: Josephine Igo, DO;  Location: [facility];  Service: Pulmonary;  Laterality: Left;  Indwelling Pleural Catheter   HIP ARTHROPLASTY Left 10/24/2018   Procedure: ARTHROPLASTY BIPOLAR HIP (HEMIARTHROPLASTY);  Surgeon: Durene Romans, MD;  Location: [facility] OR;  Service: Orthopedics;  Laterality: Left;   INSERTION PROSTATE RADIATION SEED  2000   LUMBAR DISC SURGERY  1990s   NASAL SINUS SURGERY     x 3   REMOVAL OF PLEURAL DRAINAGE CATHETER N/A 12/24/2020   Procedure: MINOR REMOVAL OF PLEURAL DRAINAGE CATHETER;  Surgeon: Josephine Igo, DO;  Location: [facility];  Service: Cardiopulmonary;  Laterality: N/A;   THORACENTESIS N/A 08/15/2020   Procedure: THORACENTESIS;  Surgeon: Josephine Igo, DO;  Location: [facility];  Service: Pulmonary;  Laterality: N/A;    Family History  Problem Relation Age of Onset   Stroke Brother    Stroke Sister    Stroke Mother    Hyperlipidemia Mother    Hypertension Mother    Lung disease Father    Diabetes Son    Gout Son    Obesity Sister    Alcohol abuse Brother    Cancer Brother        lung, smoker    Social History  Socioeconomic History   Marital status: Married    Spouse name: Not on file   Number of children: 1   Years of education: 10   Highest education level: Not on file  Occupational History   Occupation: Retired  Tobacco Use   Smoking status: Former    Current packs/day: 0.00    Average packs/day: 0.3 packs/day for 35.0 years (8.8 ttl pk-yrs)    Types: Cigarettes    Start date: 67    Quit date: 01/12/1978    Years since quitting: 44.6   Smokeless tobacco: Current    Types: Chew   Tobacco comments:    Started smoking at age 26  Vaping Use   Vaping status: Never Used  Substance and Sexual Activity   Alcohol use: No    Comment: Prior alcoholic, sober since 2002   Drug use: No   Sexual activity: Not Currently  Other Topics Concern   Not on file  Social History  Narrative   Retired lives at home w/ his wife   1 daughter   Right-handed    Caffeine: drinks a lot of coffee    Former smoker, former alcoholic abstinent for many years   Social Determinants of Corporate investment banker Strain: Low Risk  (05/15/2021)   Overall Financial Resource Strain (CARDIA)    Difficulty of Paying Living Expenses: Not hard at all  Food Insecurity: No Food Insecurity (05/22/2022)   Hunger Vital Sign    Worried About Running Out of Food in the Last Year: Never true    Ran Out of Food in the Last Year: Never true  Transportation Needs: No Transportation Needs (05/22/2022)   PRAPARE - Administrator, Civil Service (Medical): No    Lack of Transportation (Non-Medical): No  Physical Activity: Insufficiently Active (05/15/2021)   Exercise Vital Sign    Days of Exercise per Week: 1 day    Minutes of Exercise per Session: 50 min  Stress: No Stress Concern Present (05/15/2021)   [employer] of Occupational Health - Occupational Stress Questionnaire    Feeling of Stress : Not at all  Social Connections: Socially Integrated (05/15/2021)   Social Connection and Isolation Panel [NHANES]    Frequency of Communication with Friends and Family: More than three times a week    Frequency of Social Gatherings with Friends and Family: More than three times a week    Attends Religious Services: More than 4 times per year    Active Member of [employer] or Organizations: Yes    Attends Engineer, structural: More than 4 times per year    Marital Status: Married  Catering manager Violence: Not At Risk (05/15/2021)   Humiliation, Afraid, Rape, and Kick questionnaire    Fear of Current or Ex-Partner: No    Emotionally Abused: No    Physically Abused: No    Sexually Abused: No    Outpatient Medications Prior to Visit  Medication Sig Dispense Refill   acetaminophen (TYLENOL) 500 MG tablet Take 1,000 mg by mouth every 6 (six) hours as needed for mild pain or headache.      albuterol (VENTOLIN HFA) 108 (90 Base) MCG/ACT inhaler Inhale 1-2 puffs into the lungs every 6 (six) hours as needed for wheezing or shortness of breath. 18 g 1   amLODipine (NORVASC) 5 MG tablet Take 2 tablets (10 mg total) by mouth daily (need office visit) 180 tablet 1   apixaban (ELIQUIS) 5 MG TABS tablet Take 1 tablet (  5 mg) by mouth 2 times daily. 180 tablet 3   atorvastatin (LIPITOR) 40 MG tablet Take 1 tablet (40 mg total) by mouth daily. 90 tablet 0   Cholecalciferol (VITAMIN D3 PO) Take 1 tablet by mouth in the morning. Unsure of how many units     docusate sodium (COLACE) 100 MG capsule Take 300 mg by mouth 2 (two) times daily.     Ferrous Fumarate (HEMOCYTE - 106 MG FE) 324 (106 Fe) MG TABS tablet Take 1 tablet (106 mg of iron total) by mouth 2 times daily. 30 tablet 2   furosemide (LASIX) 40 MG tablet 40 mg qday x 3 days. Then prn SOB/LE edema. (Patient taking differently: Take 40 mg by mouth. 40 mg qday x 3 days. Then prn SOB/LE edema.) 30 tablet 0   gabapentin (NEURONTIN) 300 MG capsule Take 1 capsule (300 mg total) by mouth 3 (three) times daily. (Patient taking differently: Take 300 mg by mouth at bedtime.) 270 capsule 1   lisinopril (ZESTRIL) 5 MG tablet Take 1 tablet (5 mg) by mouth daily. 90 tablet 3   metoCLOPramide (REGLAN) 5 MG tablet Take 1 tablet (5 mg total) by mouth at bedtime. Call for appointment for future refills. 30 tablet 3   minoxidil (LONITEN) 10 MG tablet Take 1 tablet (10 mg) by mouth 2 times daily. 180 tablet 3   ondansetron (ZOFRAN ODT) 8 MG disintegrating tablet Dissolve 1 tablet by mouth every 8 hours as needed for nausea or vomiting. 20 tablet 0   polyvinyl alcohol (LIQUIFILM TEARS) 1.4 % ophthalmic solution Place 1 drop into both eyes 5 (five) times daily as needed for dry eyes.     sennosides-docusate sodium (SENOKOT-S) 8.6-50 MG tablet Take 1-2 tablets by mouth See admin instructions. Take 1 tablet in the morning & take 2 tablet at night     traMADol  (ULTRAM) 50 MG tablet Take 1 tablet (50 mg) by mouth 3 times daily as needed. 90 tablet 0   triamcinolone cream (KENALOG) 0.1 % Apply topically to affected areas of the skin daily as needed. 454 g 1   No facility-administered medications prior to visit.    Allergies  Allergen Reactions   Hctz [Hydrochlorothiazide] Shortness Of Breath, Swelling and Other (See Comments)    "Swelling and dyspnea"    Hydralazine Shortness Of Breath and Other (See Comments)    Chest pain and GI issues also   Diovan [Valsartan] Other (See Comments)    Elevated potassium- Hyperkalemia   Metformin And Related Other (See Comments)    Reaction not recalled   Nsaids Other (See Comments)    Other than Tylenol, he isn't to have these because he's on Eliquis   Other Other (See Comments)    Clonidine B/P patch = Caused redness   Prednisone Other (See Comments)    Suicidal thoughts   Spironolactone Swelling    Site of swelling not recalled   Codeine Itching and Nausea Only   Oxycodone-Acetaminophen Itching and Nausea Only    Review of Systems  Constitutional:  Negative for fever and malaise/fatigue.  HENT:  Negative for congestion.   Eyes:  Negative for blurred vision.  Respiratory:  Positive for shortness of breath.   Cardiovascular:  Positive for leg swelling. Negative for chest pain and palpitations.  Gastrointestinal:  Negative for abdominal pain, blood in stool and nausea.  Genitourinary:  Negative for dysuria and frequency.  Musculoskeletal:  Negative for falls.  Skin:  Negative for rash.  Neurological:  Positive  for weakness. Negative for dizziness, loss of consciousness and headaches.  Endo/Heme/Allergies:  Negative for environmental allergies.  Psychiatric/Behavioral:  Negative for depression. The patient is not nervous/anxious.        Objective:    Physical Exam Vitals reviewed.  Constitutional:      Appearance: Normal appearance. He is not ill-appearing.  HENT:     Head: Normocephalic  and atraumatic.     Nose: Nose normal.  Eyes:     Conjunctiva/sclera: Conjunctivae normal.  Cardiovascular:     Rate and Rhythm: Normal rate.     Pulses: Normal pulses.     Heart sounds: Normal heart sounds. No murmur heard. Pulmonary:     Effort: Pulmonary effort is normal.     Breath sounds: Normal breath sounds. No wheezing.  Abdominal:     Palpations: Abdomen is soft. There is no mass.     Tenderness: There is no abdominal tenderness.  Musculoskeletal:     Cervical back: Normal range of motion.     Right lower leg: Edema present.     Left lower leg: Edema present.     Comments: 1+  Skin:    General: Skin is warm and dry.  Neurological:     General: No focal deficit present.     Mental Status: He is alert and oriented to person, place, and time.  Psychiatric:        Mood and Affect: Mood normal.     BP 132/66   Pulse 66   Temp 98.1 F (36.7 C) (Oral)   Ht 5\' 11"  (1.803 m)   Wt 138 lb 3.2 oz (62.7 kg)   SpO2 96%   BMI 19.27 kg/m  Wt Readings from Last 3 Encounters:  08/27/22 138 lb 3.2 oz (62.7 kg)  08/19/22 137 lb 9.6 oz (62.4 kg)  08/15/22 135 lb (61.2 kg)    Diabetic Foot Exam - Simple   No data filed    Lab Results  Component Value Date   WBC 8.8 08/27/2022   HGB 10.0 (L) 08/27/2022   HCT 29.9 (L) 08/27/2022   PLT 472.0 (H) 08/27/2022   GLUCOSE 104 (H) 08/27/2022   CHOL 117 08/27/2022   TRIG 59.0 08/27/2022   HDL 48.90 08/27/2022   LDLCALC 56 08/27/2022   ALT 4 08/27/2022   AST 11 08/27/2022   NA 135 08/27/2022   K 4.5 08/27/2022   CL 99 08/27/2022   CREATININE 1.15 08/27/2022   BUN 24 (H) 08/27/2022   CO2 30 08/27/2022   TSH 7.46 (H) 08/27/2022   PSA 0.00 (L) 04/17/2021   INR 1.4 (H) 10/19/2021   HGBA1C 5.6 08/27/2022    Lab Results  Component Value Date   TSH 7.46 (H) 08/27/2022   Lab Results  Component Value Date   WBC 8.8 08/27/2022   HGB 10.0 (L) 08/27/2022   HCT 29.9 (L) 08/27/2022   MCV 91.9 08/27/2022   PLT 472.0 (H)  08/27/2022   Lab Results  Component Value Date   NA 135 08/27/2022   K 4.5 08/27/2022   CO2 30 08/27/2022   GLUCOSE 104 (H) 08/27/2022   BUN 24 (H) 08/27/2022   CREATININE 1.15 08/27/2022   BILITOT 0.4 08/27/2022   ALKPHOS 57 08/27/2022   AST 11 08/27/2022   ALT 4 08/27/2022   PROT 6.0 08/27/2022   ALBUMIN 3.5 08/27/2022   CALCIUM 8.8 08/27/2022   ANIONGAP 11 08/15/2022   EGFR 62 07/18/2021   GFR 55.83 (L) 08/27/2022   Lab  Results  Component Value Date   CHOL 117 08/27/2022   Lab Results  Component Value Date   HDL 48.90 08/27/2022   Lab Results  Component Value Date   LDLCALC 56 08/27/2022   Lab Results  Component Value Date   TRIG 59.0 08/27/2022   Lab Results  Component Value Date   CHOLHDL 2 08/27/2022   Lab Results  Component Value Date   HGBA1C 5.6 08/27/2022       Assessment & Plan:  Acute on chronic diastolic CHF (congestive heart failure) (HCC) Assessment & Plan: Recent exacerbation, will repeat BNP today and adjust treatment accordingly  Orders: -     Brain natriuretic peptide  Controlled type 2 diabetes mellitus with hyperglycemia, without long-term current use of insulin (HCC) Assessment & Plan: hgba1c acceptable, minimize simple carbs. Increase exercise as tolerated. Continue current meds   Orders: -     Lipid panel -     Hemoglobin A1c  Essential hypertension Assessment & Plan: Well controlled, no changes to meds. Encouraged heart healthy diet such as the DASH diet and exercise as tolerated.    Orders: -     CBC with Differential/Platelet -     Comprehensive metabolic panel -     TSH  Hypoalbuminemia Assessment & Plan: Supplement and monitor    PAF (paroxysmal atrial fibrillation) (HCC) Assessment & Plan: Asymptomatic and follows with cardiology, tolerating meds   Vitamin D deficiency Assessment & Plan: Supplement and monitor   Orders: -     VITAMIN D 25 Hydroxy (Vit-D Deficiency, Fractures)  Iron deficiency  anemia, unspecified iron deficiency anemia type -     Iron, TIBC and Ferritin Panel  Pedal edema Assessment & Plan: Stay as active as able. Elevate feet above heart for 15 minutes twice a day. Furosemide daily and second dose prn.   Chronic diastolic congestive heart failure [facility]) Assessment & Plan: Continue to follow up with cardiology. Minimize sodium intake. Elevate feet above heart bid. Compression hose. Furosemide daily and second dose prn weight gain must weigh himself daily   Chronic renal impairment, unspecified CKD stage Assessment & Plan: Hydrate and monitor    Mixed hyperlipidemia Assessment & Plan: Encourage heart healthy diet such as MIND or DASH diet, increase exercise, avoid trans fats, simple carbohydrates and processed foods, consider a krill or fish or flaxseed oil cap daily.     SOB (shortness of breath) Assessment & Plan: Continues to struggle with sob but it is not worsening. May continue current meds for now   Other orders -     Furosemide; Take 1-2 tablets (20-40 mg total) by mouth daily as needed.  Dispense: 60 tablet; Refill: 3    Danise Edge, MD

## 2022-08-30 NOTE — Assessment & Plan Note (Signed)
Continue to follow up with cardiology. Minimize sodium intake. Elevate feet above heart bid. Compression hose. Furosemide daily and second dose prn weight gain must weigh himself daily

## 2022-08-30 NOTE — Assessment & Plan Note (Signed)
Encourage heart healthy diet such as MIND or DASH diet, increase exercise, avoid trans fats, simple carbohydrates and processed foods, consider a krill or fish or flaxseed oil cap daily.  °

## 2022-08-30 NOTE — Assessment & Plan Note (Signed)
Hydrate and monitor 

## 2022-08-30 NOTE — Assessment & Plan Note (Signed)
Continues to struggle with sob but it is not worsening. May continue current meds for now

## 2022-08-31 NOTE — Telephone Encounter (Signed)
Left a message for the pt to call back.  

## 2022-08-31 NOTE — Telephone Encounter (Signed)
[name] returning nurses call from this morning. Please advise

## 2022-09-01 ENCOUNTER — Other Ambulatory Visit (HOSPITAL_COMMUNITY): Payer: Self-pay

## 2022-09-01 NOTE — Telephone Encounter (Signed)
See about adding in if space.  COuld put at end of day but patient needs to be aware of potential wait

## 2022-09-01 NOTE — Telephone Encounter (Signed)
I advised the pts wife that I will keep the pt on my list and work towards finding him a Sept f/u appt.

## 2022-09-01 NOTE — Telephone Encounter (Signed)
Pt/wife given appt with Dr Tenny Craw 09/16/22.

## 2022-09-02 ENCOUNTER — Other Ambulatory Visit (HOSPITAL_COMMUNITY): Payer: Self-pay

## 2022-09-07 ENCOUNTER — Other Ambulatory Visit (HOSPITAL_COMMUNITY): Payer: Self-pay

## 2022-09-07 ENCOUNTER — Other Ambulatory Visit: Payer: Self-pay | Admitting: Family Medicine

## 2022-09-07 MED ORDER — TRAMADOL HCL 50 MG PO TABS
50.0000 mg | ORAL_TABLET | Freq: Three times a day (TID) | ORAL | 0 refills | Status: DC | PRN
  Filled 2022-09-07: qty 90, 30d supply, fill #0

## 2022-09-07 NOTE — Telephone Encounter (Signed)
Requesting: tramadol 50mg   Contract: 04/07/22 UDS: 07/18/20 Last Visit: 08/27/22 Next Visit:  11/24/22 Last Refill: 08/07/22 #90 and 0RF   Please Advise

## 2022-09-08 ENCOUNTER — Other Ambulatory Visit: Payer: Self-pay

## 2022-09-08 ENCOUNTER — Other Ambulatory Visit (HOSPITAL_COMMUNITY): Payer: Self-pay

## 2022-09-15 NOTE — Progress Notes (Signed)
Cardiology Office Note   Date:  09/16/2022   ID:  [name], DOB May 05, 1929, MRN [id number]  PCP:  Bradd Canary, MD  Cardiologist:   Dietrich Pates, MD   F/U of HTN   History of Present Illness: [name] is a 89 y.o. male with a history of HTN (very labile in past), CV dz (s/p L CEA 2002), HL, GERD, reactive airway dz, esophageal stricture, and PAF   Echo in 2020 LVEF and RVEF normal    2022 pt has been seen by pulmonary for pleural effusion   Thoracentesis in July 2022   Cytology neg for malignancy, lymphocytes  Concern for possible malignancy   Effusoin recurred    Diuresed with low dose lasix  Underwent repeat thoracentesis of 2 L on 8/4 again of amber colored fluid, cytology negative     I saw the pt in June 2023    The pt comes in today with his wife     He was seen in [place] ER on 08/15/22 with for SOB, congestion   He was given IV lasix and sent home on a few days of 40 mg lasix x 3   The [name] says his ankle swelling went down with this and that it has come back some since stopping  The pt denies CP   Says his appetitie is fair  He says his breathing is OK  Current Meds  Medication Sig   acetaminophen (TYLENOL) 500 MG tablet Take 1,000 mg by mouth every 6 (six) hours as needed for mild pain or headache.   albuterol (VENTOLIN HFA) 108 (90 Base) MCG/ACT inhaler Inhale 1-2 puffs into the lungs every 6 (six) hours as needed for wheezing or shortness of breath.   amLODipine (NORVASC) 5 MG tablet Take 2 tablets (10 mg total) by mouth daily (need office visit)   apixaban (ELIQUIS) 5 MG TABS tablet Take 1 tablet (5 mg) by mouth 2 times daily.   atorvastatin (LIPITOR) 40 MG tablet Take 1 tablet (40 mg total) by mouth daily.   Cholecalciferol (VITAMIN D3 PO) Take 1 tablet by mouth in the morning. Unsure of how many units   docusate sodium (COLACE) 100 MG capsule Take 300 mg by mouth 2 (two) times daily.   Ferrous Fumarate (HEMOCYTE - 106 MG FE) 324 (106 Fe) MG TABS tablet  Take 1 tablet (106 mg of iron total) by mouth 2 times daily.   [START ON 09/17/2022] furosemide (LASIX) 20 MG tablet Take 1 tablet (20 mg total) by mouth 2 (two) times a week.   furosemide (LASIX) 40 MG tablet 40 mg qday x 3 days. Then prn SOB/LE edema. (Patient taking differently: Take 40 mg by mouth. 40 mg qday x 3 days. Then prn SOB/LE edema.)   gabapentin (NEURONTIN) 300 MG capsule Take 1 capsule (300 mg total) by mouth 3 (three) times daily. (Patient taking differently: Take 300 mg by mouth at bedtime.)   lisinopril (ZESTRIL) 5 MG tablet Take 1 tablet (5 mg) by mouth daily.   metoCLOPramide (REGLAN) 5 MG tablet Take 1 tablet (5 mg total) by mouth at bedtime. Call for appointment for future refills.   minoxidil (LONITEN) 10 MG tablet Take 1 tablet (10 mg) by mouth 2 times daily.   ondansetron (ZOFRAN ODT) 8 MG disintegrating tablet Dissolve 1 tablet by mouth every 8 hours as needed for nausea or vomiting.   polyvinyl alcohol (LIQUIFILM TEARS) 1.4 % ophthalmic solution Place 1 drop into both  eyes 5 (five) times daily as needed for dry eyes.   sennosides-docusate sodium (SENOKOT-S) 8.6-50 MG tablet Take 1-2 tablets by mouth See admin instructions. Take 1 tablet in the morning & take 2 tablet at night   traMADol (ULTRAM) 50 MG tablet Take 1 tablet (50 mg) by mouth 3 times daily as needed.   triamcinolone cream (KENALOG) 0.1 % Apply topically to affected areas of the skin daily as needed.   [DISCONTINUED] furosemide (LASIX) 20 MG tablet Take 1-2 tablets (20-40 mg total) by mouth daily as needed.     Allergies:   Hctz [hydrochlorothiazide], Hydralazine, Diovan [valsartan], Metformin and related, Nsaids, Other, Prednisone, Spironolactone, Codeine, and Oxycodone-acetaminophen   Past Medical History:  Diagnosis Date   Acute bronchitis 06/10/2015   Allergic rhinitis    Anemia    Asthma    Carotid stenosis    a. s/p Left CEA 2002;  b. Carotid US 3/16:  patent R CEA, L < 40%   Chronic pain syndrome     Left shoulder, back   Closed fracture of neck of right femur (HCC)    Diverticulosis    Dyslipidemia    Epigastric pain 04/05/2016   Esophageal stricture    Distal, benign   Femur fracture, left (HCC) 10/27/2018   GERD (gastroesophageal reflux disease)    Hemorrhoids    HTN (hypertension)    Negative renal duplex 07-22-11   Hx of echocardiogram    a. Echo 10/11: mod LVH, EF 55-60%   Pleural effusion 07/17/2020   Prostate cancer (HCC) 2000   Seed XRT   S/P thoracentesis    Spondylosis, cervical, with myelopathy 11/26/2015   Stroke (HCC) 08/1999   right thalamic - on chronic Plavix/ASA    Past Surgical History:  Procedure Laterality Date   ANTERIOR APPROACH HEMI HIP ARTHROPLASTY Right 03/15/2021   Procedure: ANTERIOR APPROACH HEMI HIP ARTHROPLASTY;  Surgeon: Samson Frederic, MD;  Location: [facility];  Service: Orthopedics;  Laterality: Right;   APPENDECTOMY  1953   CAROTID ENDARTERECTOMY Right 01/2000   CATARACT EXTRACTION Bilateral    CERVICAL DISC SURGERY  8/07   CHEST TUBE INSERTION Left 11/11/2020   Procedure: INSERTION PLEURAL DRAINAGE CATHETER;  Surgeon: Josephine Igo, DO;  Location: [facility];  Service: Pulmonary;  Laterality: Left;  Indwelling Pleural Catheter   HIP ARTHROPLASTY Left 10/24/2018   Procedure: ARTHROPLASTY BIPOLAR HIP (HEMIARTHROPLASTY);  Surgeon: Durene Romans, MD;  Location: [facility] OR;  Service: Orthopedics;  Laterality: Left;   INSERTION PROSTATE RADIATION SEED  2000   LUMBAR DISC SURGERY  1990s   NASAL SINUS SURGERY     x 3   REMOVAL OF PLEURAL DRAINAGE CATHETER N/A 12/24/2020   Procedure: MINOR REMOVAL OF PLEURAL DRAINAGE CATHETER;  Surgeon: Josephine Igo, DO;  Location: [facility];  Service: Cardiopulmonary;  Laterality: N/A;   THORACENTESIS N/A 08/15/2020   Procedure: THORACENTESIS;  Surgeon: Josephine Igo, DO;  Location: [facility];  Service: Pulmonary;  Laterality: N/A;     Social History:  The patient  reports that he quit smoking about  44 years ago. His smoking use included cigarettes. He started smoking about 74 years ago. He has a 8.8 pack-year smoking history. His smokeless tobacco use includes chew. He reports that he does not drink alcohol and does not use drugs.   Family History:  The patient's family history includes Alcohol abuse in his brother; Cancer in his brother; Diabetes in his son; Gout in his son; Hyperlipidemia in his mother; Hypertension in his  mother; Lung disease in his father; Obesity in his sister; Stroke in his brother, mother, and sister.    ROS:  Please see the history of present illness. All other systems are reviewed and  Negative to the above problem except as noted.    PHYSICAL EXAM: VS:  BP (!) 134/40   Pulse 69   Ht 5\' 11"  (1.803 m)   Wt 138 lb (62.6 kg)   SpO2 96%   BMI 19.25 kg/m   GEN: Thin 89 yo in no acute distress  HEENT: normal  Neck: no JVD Cardiac: RRR; no murmur;   Trivial LE edema  Respiratory: Decresed airflow   + wheezes   GI: soft, nontender, nondistended, + BS     EKG:  EKG is not ordered today         Lipid Panel    Component Value Date/Time   CHOL 117 08/27/2022 1419   TRIG 59.0 08/27/2022 1419   HDL 48.90 08/27/2022 1419   CHOLHDL 2 08/27/2022 1419   VLDL 11.8 08/27/2022 1419   LDLCALC 56 08/27/2022 1419      Wt Readings from Last 3 Encounters:  09/16/22 138 lb (62.6 kg)  08/27/22 138 lb 3.2 oz (62.7 kg)  08/19/22 137 lb 9.6 oz (62.4 kg)      ASSESSMENT AND PLAN:  1 HTN   BP is controlled   Keep on current regimen    2  PAF  Pt clinically in SR  He denies palpitations   Continue Eliquis      3 HFpEF  Volume does appear to be mildly increased  I would  recomm he take 20 lasix 2x per week Check BMET and BNP today   4 Pulmonary  Wheezing today     He has had in past  Recom using inhaler regularly       3  CV dz   Mild dz bilaterally   4  Hx CVAs   Seen in CT  Continue  anticoagulation   5  HL  Keep on lipitor  LDL  HDL 49       Current  medicines are reviewed at length with the patient today.  The patient does not have concerns regarding medicines.  Signed, Dietrich Pates, MD  09/16/2022 5:06 PM    [facility] [address] [place], [place], [place]  [address] Phone: [phone]; Fax: [phone]

## 2022-09-16 ENCOUNTER — Ambulatory Visit: Payer: Medicare HMO | Attending: Internal Medicine | Admitting: Internal Medicine

## 2022-09-16 ENCOUNTER — Other Ambulatory Visit (HOSPITAL_COMMUNITY): Payer: Self-pay

## 2022-09-16 ENCOUNTER — Encounter: Payer: Self-pay | Admitting: Internal Medicine

## 2022-09-16 VITALS — BP 134/40 | HR 69 | Ht 71.0 in | Wt 138.0 lb

## 2022-09-16 DIAGNOSIS — I480 Paroxysmal atrial fibrillation: Secondary | ICD-10-CM | POA: Diagnosis not present

## 2022-09-16 DIAGNOSIS — I5032 Chronic diastolic (congestive) heart failure: Secondary | ICD-10-CM

## 2022-09-16 DIAGNOSIS — Z79899 Other long term (current) drug therapy: Secondary | ICD-10-CM | POA: Diagnosis not present

## 2022-09-16 MED ORDER — FUROSEMIDE 20 MG PO TABS
20.0000 mg | ORAL_TABLET | ORAL | 3 refills | Status: DC
  Filled 2022-09-16: qty 30, 105d supply, fill #0
  Filled 2023-05-21: qty 24, 84d supply, fill #0
  Filled 2023-09-08: qty 24, 84d supply, fill #1

## 2022-09-16 NOTE — Patient Instructions (Signed)
Medication Instructions:  TAKE LASIX 20 MG TWICE A WEEK/ EVERY THIRD DAY  *If you need a refill on your cardiac medications before your next appointment, please call your pharmacy*   Lab Work: BMET AND PRO BNP TODAY  If you have labs (blood work) drawn today and your tests are completely normal, you will receive your results only by: MyChart Message (if you have MyChart) OR A paper copy in the mail If you have any lab test that is abnormal or we need to change your treatment, we will call you to review the results.   Testing/Procedures:    Follow-Up: At [facility], you and your health needs are our priority.  As part of our continuing mission to provide you with exceptional heart care, we have created designated Provider Care Teams.  These Care Teams include your primary Cardiologist (physician) and Advanced Practice Providers (APPs -  Physician Assistants and Nurse Practitioners) who all work together to provide you with the care you need, when you need it.  We recommend signing up for the patient portal called "MyChart".  Sign up information is provided on this After Visit Summary.  MyChart is used to connect with patients for Virtual Visits (Telemedicine).  Patients are able to view lab/test results, encounter notes, upcoming appointments, etc.  Non-urgent messages can be sent to your provider as well.   To learn more about what you can do with MyChart, go to [email or web address].    Your next appointment:  KEEP OCTOBER 2024 APPT

## 2022-09-17 LAB — BASIC METABOLIC PANEL
BUN/Creatinine Ratio: 24 (ref 10–24)
BUN: 29 mg/dL (ref 10–36)
CO2: 24 mmol/L (ref 20–29)
Calcium: 8.7 mg/dL (ref 8.6–10.2)
Chloride: 108 mmol/L — ABNORMAL HIGH (ref 96–106)
Creatinine, Ser: 1.23 mg/dL (ref 0.76–1.27)
Glucose: 139 mg/dL — ABNORMAL HIGH (ref 70–99)
Potassium: 4.9 mmol/L (ref 3.5–5.2)
Sodium: 146 mmol/L — ABNORMAL HIGH (ref 134–144)
eGFR: 55 mL/min/{1.73_m2} — ABNORMAL LOW (ref >59)

## 2022-09-17 LAB — PRO B NATRIURETIC PEPTIDE: NT-Pro BNP: 1228 pg/mL — ABNORMAL HIGH (ref 0–486)

## 2022-09-21 ENCOUNTER — Other Ambulatory Visit: Payer: Self-pay

## 2022-09-21 DIAGNOSIS — Z79899 Other long term (current) drug therapy: Secondary | ICD-10-CM

## 2022-09-21 DIAGNOSIS — I5032 Chronic diastolic (congestive) heart failure: Secondary | ICD-10-CM

## 2022-09-21 DIAGNOSIS — I480 Paroxysmal atrial fibrillation: Secondary | ICD-10-CM

## 2022-09-21 DIAGNOSIS — I10 Essential (primary) hypertension: Secondary | ICD-10-CM

## 2022-09-23 ENCOUNTER — Other Ambulatory Visit (HOSPITAL_COMMUNITY): Payer: Self-pay

## 2022-10-05 ENCOUNTER — Other Ambulatory Visit: Payer: Self-pay | Admitting: Family Medicine

## 2022-10-05 ENCOUNTER — Other Ambulatory Visit (HOSPITAL_COMMUNITY): Payer: Self-pay

## 2022-10-05 ENCOUNTER — Telehealth: Payer: Self-pay | Admitting: Family Medicine

## 2022-10-05 DIAGNOSIS — I6350 Cerebral infarction due to unspecified occlusion or stenosis of unspecified cerebral artery: Secondary | ICD-10-CM

## 2022-10-05 DIAGNOSIS — I10 Essential (primary) hypertension: Secondary | ICD-10-CM

## 2022-10-05 DIAGNOSIS — E559 Vitamin D deficiency, unspecified: Secondary | ICD-10-CM

## 2022-10-05 DIAGNOSIS — E782 Mixed hyperlipidemia: Secondary | ICD-10-CM

## 2022-10-05 MED ORDER — ATORVASTATIN CALCIUM 40 MG PO TABS
40.0000 mg | ORAL_TABLET | Freq: Every day | ORAL | 0 refills | Status: DC
Start: 2022-10-05 — End: 2023-01-11
  Filled 2022-10-05: qty 90, 90d supply, fill #0

## 2022-10-05 MED ORDER — TRAMADOL HCL 50 MG PO TABS
50.0000 mg | ORAL_TABLET | Freq: Three times a day (TID) | ORAL | 0 refills | Status: DC | PRN
Start: 2022-10-05 — End: 2022-10-31
  Filled 2022-10-05: qty 90, 30d supply, fill #0

## 2022-10-05 NOTE — Telephone Encounter (Signed)
[name] called to make sure that the lab order is acceptable for [facility] before they make the trip. Please call wife Randa Evens or patient to advise so they can go have labs done.

## 2022-10-05 NOTE — Telephone Encounter (Signed)
Requesting: tramadol 50mg   Contract:04/07/22 UDS:07/16/20 Last Visit: 08/27/22 Next Visit: 11/24/22 Last Refill: 09/07/22 #90 and 0RF   Please Advise

## 2022-10-05 NOTE — Telephone Encounter (Signed)
Called pt was advised it was his Cardiology that ordered the labs, Pt stated understand.

## 2022-10-07 ENCOUNTER — Other Ambulatory Visit (HOSPITAL_COMMUNITY): Payer: Self-pay

## 2022-10-12 ENCOUNTER — Other Ambulatory Visit: Payer: Self-pay | Admitting: Internal Medicine

## 2022-10-12 ENCOUNTER — Telehealth: Payer: Self-pay | Admitting: Internal Medicine

## 2022-10-12 ENCOUNTER — Other Ambulatory Visit: Payer: Self-pay

## 2022-10-12 DIAGNOSIS — Z79899 Other long term (current) drug therapy: Secondary | ICD-10-CM

## 2022-10-12 DIAGNOSIS — E875 Hyperkalemia: Secondary | ICD-10-CM

## 2022-10-12 DIAGNOSIS — I5032 Chronic diastolic (congestive) heart failure: Secondary | ICD-10-CM

## 2022-10-12 DIAGNOSIS — I10 Essential (primary) hypertension: Secondary | ICD-10-CM

## 2022-10-12 DIAGNOSIS — I480 Paroxysmal atrial fibrillation: Secondary | ICD-10-CM

## 2022-10-12 NOTE — Telephone Encounter (Signed)
Wife wants lab orders sent to the [facility] on [facility].

## 2022-10-12 NOTE — Telephone Encounter (Signed)
BMET and PRO BNP... ordered for lab collect.

## 2022-10-13 ENCOUNTER — Ambulatory Visit: Payer: Medicare HMO | Attending: Internal Medicine

## 2022-10-13 ENCOUNTER — Other Ambulatory Visit (HOSPITAL_COMMUNITY): Payer: Self-pay

## 2022-10-13 DIAGNOSIS — I480 Paroxysmal atrial fibrillation: Secondary | ICD-10-CM | POA: Diagnosis not present

## 2022-10-13 DIAGNOSIS — E875 Hyperkalemia: Secondary | ICD-10-CM | POA: Diagnosis not present

## 2022-10-13 DIAGNOSIS — I10 Essential (primary) hypertension: Secondary | ICD-10-CM

## 2022-10-13 DIAGNOSIS — Z79899 Other long term (current) drug therapy: Secondary | ICD-10-CM

## 2022-10-13 DIAGNOSIS — I5032 Chronic diastolic (congestive) heart failure: Secondary | ICD-10-CM | POA: Diagnosis not present

## 2022-10-13 NOTE — Addendum Note (Signed)
Addended by: Baird Lyons on: 10/13/2022 02:16 PM   Modules accepted: Orders

## 2022-10-14 ENCOUNTER — Other Ambulatory Visit (HOSPITAL_COMMUNITY): Payer: Self-pay

## 2022-10-14 LAB — BASIC METABOLIC PANEL
BUN/Creatinine Ratio: 25 — ABNORMAL HIGH (ref 10–24)
BUN: 26 mg/dL (ref 10–36)
CO2: 23 mmol/L (ref 20–29)
Calcium: 8.6 mg/dL (ref 8.6–10.2)
Chloride: 107 mmol/L — ABNORMAL HIGH (ref 96–106)
Creatinine, Ser: 1.06 mg/dL (ref 0.76–1.27)
Glucose: 76 mg/dL (ref 70–99)
Potassium: 4.6 mmol/L (ref 3.5–5.2)
Sodium: 143 mmol/L (ref 134–144)
eGFR: 66 mL/min/{1.73_m2} (ref >59)

## 2022-10-14 LAB — PRO B NATRIURETIC PEPTIDE: NT-Pro BNP: 943 pg/mL — ABNORMAL HIGH (ref 0–486)

## 2022-10-14 MED ORDER — AMLODIPINE BESYLATE 5 MG PO TABS
10.0000 mg | ORAL_TABLET | Freq: Every day | ORAL | 3 refills | Status: DC
  Filled 2022-10-14: qty 180, 90d supply, fill #0
  Filled 2023-01-19: qty 180, 90d supply, fill #1
  Filled 2023-04-20: qty 180, 90d supply, fill #2
  Filled 2023-07-20: qty 180, 90d supply, fill #3

## 2022-10-15 ENCOUNTER — Other Ambulatory Visit (HOSPITAL_COMMUNITY): Payer: Self-pay

## 2022-10-16 ENCOUNTER — Other Ambulatory Visit (HOSPITAL_COMMUNITY): Payer: Self-pay

## 2022-10-22 ENCOUNTER — Other Ambulatory Visit: Payer: Medicare HMO

## 2022-10-26 DIAGNOSIS — L989 Disorder of the skin and subcutaneous tissue, unspecified: Secondary | ICD-10-CM | POA: Diagnosis not present

## 2022-10-26 DIAGNOSIS — L570 Actinic keratosis: Secondary | ICD-10-CM | POA: Diagnosis not present

## 2022-10-26 DIAGNOSIS — Z85828 Personal history of other malignant neoplasm of skin: Secondary | ICD-10-CM | POA: Diagnosis not present

## 2022-10-26 DIAGNOSIS — C4442 Squamous cell carcinoma of skin of scalp and neck: Secondary | ICD-10-CM | POA: Diagnosis not present

## 2022-10-26 DIAGNOSIS — D485 Neoplasm of uncertain behavior of skin: Secondary | ICD-10-CM | POA: Diagnosis not present

## 2022-10-30 ENCOUNTER — Telehealth: Payer: Self-pay | Admitting: Internal Medicine

## 2022-10-30 ENCOUNTER — Ambulatory Visit: Payer: Medicare HMO | Admitting: Nurse Practitioner

## 2022-10-30 NOTE — Telephone Encounter (Signed)
[name] states she did not want to cancel both appts for today 10/18 and 10/23 with Dr. Tenny Craw. She is wanting to speak with a nurse. Please advise

## 2022-10-30 NOTE — Telephone Encounter (Signed)
Patient's wife calling stating patient's appointment got canceled on accident and wanted to get that appointment back. Appointment has already been filled. Made patient an appointment on a day Dr. Tenny Craw is adding back into clinic. Will let her nurse know.

## 2022-10-31 ENCOUNTER — Other Ambulatory Visit: Payer: Self-pay | Admitting: Family Medicine

## 2022-10-31 ENCOUNTER — Other Ambulatory Visit (HOSPITAL_COMMUNITY): Payer: Self-pay

## 2022-11-02 ENCOUNTER — Other Ambulatory Visit: Payer: Self-pay | Admitting: Gastroenterology

## 2022-11-02 ENCOUNTER — Other Ambulatory Visit (HOSPITAL_COMMUNITY): Payer: Self-pay

## 2022-11-02 MED ORDER — TRAMADOL HCL 50 MG PO TABS
50.0000 mg | ORAL_TABLET | Freq: Three times a day (TID) | ORAL | 0 refills | Status: DC | PRN
  Filled 2022-11-02: qty 90, 30d supply, fill #0

## 2022-11-02 MED ORDER — METOCLOPRAMIDE HCL 5 MG PO TABS
5.0000 mg | ORAL_TABLET | Freq: Every day | ORAL | 3 refills | Status: DC
  Filled 2022-11-02 – 2022-11-18 (×2): qty 30, 30d supply, fill #0
  Filled 2022-12-15: qty 30, 30d supply, fill #1
  Filled 2023-01-19: qty 30, 30d supply, fill #2
  Filled 2023-02-18 (×2): qty 30, 30d supply, fill #3

## 2022-11-02 NOTE — Telephone Encounter (Signed)
Requesting: tramadol 50mg   Contract: 04/07/22 UDS: 07/16/20 Last Visit: 08/27/22 Next Visit: 11/24/22 Last Refill: 10/05/22 #90 and 0RF  Please Advise

## 2022-11-03 ENCOUNTER — Other Ambulatory Visit (HOSPITAL_COMMUNITY): Payer: Self-pay

## 2022-11-03 ENCOUNTER — Ambulatory Visit: Payer: Medicare HMO | Admitting: Internal Medicine

## 2022-11-09 ENCOUNTER — Other Ambulatory Visit: Payer: Self-pay

## 2022-11-10 ENCOUNTER — Ambulatory Visit: Payer: Medicare HMO | Admitting: Internal Medicine

## 2022-11-13 NOTE — Progress Notes (Deleted)
Cardiology Clinic Note   Patient Name: [name] Date of Encounter: 11/13/2022  Primary Care Provider:  Bradd Canary, MD Primary Cardiologist:  Dietrich Pates, MD  Patient Profile    [name] 89 year old male presents to the clinic today for follow-up evaluation of his paroxysmal atrial fibrillation, chronic diastolic CHF and preoperative cardiac evaluation.  Past Medical History    Past Medical History:  Diagnosis Date   Acute bronchitis 06/10/2015   Allergic rhinitis    Anemia    Asthma    Carotid stenosis    a. s/p Left CEA 2002;  b. Carotid US 3/16:  patent R CEA, L < 40%   Chronic pain syndrome    Left shoulder, back   Closed fracture of neck of right femur (HCC)    Diverticulosis    Dyslipidemia    Epigastric pain 04/05/2016   Esophageal stricture    Distal, benign   Femur fracture, left (HCC) 10/27/2018   GERD (gastroesophageal reflux disease)    Hemorrhoids    HTN (hypertension)    Negative renal duplex 07-22-11   Hx of echocardiogram    a. Echo 10/11: mod LVH, EF 55-60%   Pleural effusion 07/17/2020   Prostate cancer (HCC) 2000   Seed XRT   S/P thoracentesis    Spondylosis, cervical, with myelopathy 11/26/2015   Stroke (HCC) 08/1999   right thalamic - on chronic Plavix/ASA   Past Surgical History:  Procedure Laterality Date   ANTERIOR APPROACH HEMI HIP ARTHROPLASTY Right 03/15/2021   Procedure: ANTERIOR APPROACH HEMI HIP ARTHROPLASTY;  Surgeon: Samson Frederic, MD;  Location: [facility] OR;  Service: Orthopedics;  Laterality: Right;   APPENDECTOMY  1953   CAROTID ENDARTERECTOMY Right 01/2000   CATARACT EXTRACTION Bilateral    CERVICAL DISC SURGERY  8/07   CHEST TUBE INSERTION Left 11/11/2020   Procedure: INSERTION PLEURAL DRAINAGE CATHETER;  Surgeon: Josephine Igo, DO;  Location: [facility] END[facility]COPY;  Service: Pulmonary;  Laterality: Left;  Indwelling Pleural Catheter   HIP ARTHROPLASTY Left 10/24/2018   Procedure: ARTHROPLASTY BIPOLAR HIP  (HEMIARTHROPLASTY);  Surgeon: Durene Romans, MD;  Location: [facility] OR;  Service: Orthopedics;  Laterality: Left;   INSERTION PROSTATE RADIATION SEED  2000   LUMBAR DISC SURGERY  1990s   NASAL SINUS SURGERY     x 3   REMOVAL OF PLEURAL DRAINAGE CATHETER N/A 12/24/2020   Procedure: MINOR REMOVAL OF PLEURAL DRAINAGE CATHETER;  Surgeon: Josephine Igo, DO;  Location: [facility];  Service: Cardiopulmonary;  Laterality: N/A;   THORACENTESIS N/A 08/15/2020   Procedure: THORACENTESIS;  Surgeon: Josephine Igo, DO;  Location: [facility];  Service: Pulmonary;  Laterality: N/A;    Allergies  Allergies  Allergen Reactions   Hctz [Hydrochlorothiazide] Shortness Of Breath, Swelling and Other (See Comments)    "Swelling and dyspnea"    Hydralazine Shortness Of Breath and Other (See Comments)    Chest pain and GI issues also   Diovan [Valsartan] Other (See Comments)    Elevated potassium- Hyperkalemia   Metformin And Related Other (See Comments)    Reaction not recalled   Nsaids Other (See Comments)    Other than Tylenol, he isn't to have these because he's on Eliquis   Other Other (See Comments)    Clonidine B/P patch = Caused redness   Prednisone Other (See Comments)    Suicidal thoughts   Spironolactone Swelling    Site of swelling not recalled   Codeine Itching and Nausea Only  Oxycodone-Acetaminophen Itching and Nausea Only    History of Present Illness    [name] has a PMH of labile hypertension, cardiovascular disease status post left CEA 2002, hyperlipidemia, GERD, reactive airways disease, esophageal stricture, and paroxysmal atrial fibrillation.  His echocardiogram in 2020 showed an LVEF and right ventricular function that were normal.  He was seen in follow-up by Dr. Tenny Craw 11/21.  He had been seen by pulmonology for pleural effusion.  He underwent thoracentesis 7/22.  Cytology was negative for malignancy.  His effusion recurred.  He diuresed with low-dose furosemide.  He  underwent repeat thoracentesis and 2 L were removed on 8/4.  Again cytology was negative.  He was seen in follow-up by Dr. Tenny Craw on 07/01/2021.  He remained stable from a cardiac standpoint.  He did note some back pain.  He was planning to have back injection.  His blood pressure was well-controlled.  He was noted to be in sinus rhythm and denied palpitations.  Follow-up was planned for 8 months.  He presented to the clinic 05/08/22 for preoperative cardiac evaluation and follow-up evaluation.  He stated he felt well.  He reported compliance with his medication and denied side effects.  We reviewed his upcoming dental extractions and recommendations for Eliquis hold.  He and his wife expressed understanding.  We reviewed his last visit with Dr. Tenny Craw and medications.  He followed with Dr. Tonia Brooms for pulmonology.  He remained stable from a cardiac standpoint . He was able to complete greater than 4 METS of physical activity.  I planned follow-up in 6 to 8 months.  He was seen in follow up by Dr. Tenny Craw on 09/16/22. His 08/15/22 ED visit was reviewed. He had been sent home with 40 mg of lasix x 3. His wife noted that his swelling had gone down and started to come back since medication was stopped. He denied cp, and reported that his breathing was stable.   He presents to the clinic today for follow up evaluation and states***  Today he denies chest pain, shortness of breath, lower extremity edema, fatigue, palpitations, melena, hematuria, hemoptysis, diaphoresis, weakness, presyncope, syncope, orthopnea, and PND.     Home Medications    Prior to Admission medications   Medication Sig Start Date End Date Taking? Authorizing Provider  acetaminophen (TYLENOL) 500 MG tablet Take 1,000 mg by mouth every 6 (six) hours as needed for mild pain or headache.    [provider]  albuterol (VENTOLIN HFA) 108 (90 Base) MCG/ACT inhaler Inhale 1-2 puffs into the lungs every 6 (six) hours as needed for wheezing or  shortness of breath. 04/07/22   Bradd Canary, MD  amLODipine (NORVASC) 5 MG tablet Take 2 tablets (10 mg total) by mouth daily (need office visit) 04/15/22   Pricilla Riffle, MD  amoxicillin (AMOXIL) 500 MG capsule Take 1 capsule (500 mg total) by mouth 3 (three) times daily for 7 days. 04/30/22 05/07/22    apixaban (ELIQUIS) 5 MG TABS tablet Take 1 tablet (5 mg total) by mouth 2 (two) times daily. 05/04/22 05/04/23  Pricilla Riffle, MD  atorvastatin (LIPITOR) 40 MG tablet Take 1 tablet (40 mg total) by mouth daily. 04/13/22   Bradd Canary, MD  Cholecalciferol (VITAMIN D3 PO) Take 1 tablet by mouth in the morning. Unsure of how many units    [provider]  docusate sodium (COLACE) 100 MG capsule Take 300 mg by mouth 2 (two) times daily.    [provider]  doxycycline (VIBRA-TABS) 100 MG tablet Take 1 tablet (100 mg total) by mouth 2 (two) times daily. 04/09/22   Bradd Canary, MD  Ferrous Fumarate (HEMOCYTE - 106 MG FE) 324 (106 Fe) MG TABS tablet Take 1 tablet (106 mg of iron total) by mouth 2 times daily. 08/07/21   Bradd Canary, MD  gabapentin (NEURONTIN) 300 MG capsule TAKE 1 CAPSULE BY MOUTH 3 TIMES DAILY Patient taking differently: Take 300 mg by mouth at bedtime. 12/23/20 12/23/21  Bradd Canary, MD  hyoscyamine (LEVSIN SL) 0.125 MG SL tablet Place 1 tablet under the tongue every 4 hours as needed. 04/07/22   Bradd Canary, MD  lisinopril (ZESTRIL) 5 MG tablet Take 1 tablet (5 mg) by mouth daily. 04/07/22   Bradd Canary, MD  metoCLOPramide (REGLAN) 5 MG tablet Take 1 tablet (5 mg total) by mouth at bedtime. Call for appointment for future refills. 04/15/22   Iva Boop, MD  minoxidil (LONITEN) 10 MG tablet Take 1 tablet (10 mg) by mouth 2 times daily. Please call Dr. Tenny Craw for yearly appointment for June 2024 [phone] 04/30/22   Pricilla Riffle, MD  ondansetron [facility] ODT) 8 MG disintegrating tablet Dissolve 1 tablet by mouth every 8 hours as needed for nausea or  vomiting. 08/23/20   Bradd Canary, MD  pantoprazole (PROTONIX) 40 MG tablet TAKE 1 TABLET BY MOUTH ONCE A DAY BEFORE BREAKFAST 03/18/20 07/01/21  Iva Boop, MD  polyvinyl alcohol (LIQUIFILM TEARS) 1.4 % ophthalmic solution Place 1 drop into both eyes 5 (five) times daily as needed for dry eyes.    [provider]  sennosides-docusate sodium (SENOKOT-S) 8.6-50 MG tablet Take 1-2 tablets by mouth See admin instructions. Take 1 tablet in the morning & take 2 tablet at nigh    [provider]  traMADol (ULTRAM) 50 MG tablet Take 1 tablet (50 mg total) by mouth 3 (three) times daily as needed. 04/13/22 10/10/22  Bradd Canary, MD  triamcinolone cream (KENALOG) 0.1 % Apply topically to affected areas of the skin daily as needed. 10/16/21     triamcinolone cream (KENALOG) 0.5 % Apply 1 application topically daily as needed (for skin irritation).  01/22/12   Etta Grandchild, MD  Vitamin D, Ergocalciferol, (DRISDOL) 1.25 MG (50000 UNIT) CAPS capsule Take 1 capsule (50,000 Units total) by mouth every 7 (seven) days. 04/09/22   Bradd Canary, MD    Family History    Family History  Problem Relation Age of Onset   Stroke Brother    Stroke Sister    Stroke Mother    Hyperlipidemia Mother    Hypertension Mother    Lung disease Father    Diabetes Son    Gout Son    Obesity Sister    Alcohol abuse Brother    Cancer Brother        lung, smoker   He indicated that his mother is deceased. He indicated that his father is deceased. He indicated that only one of his two sisters is alive. He indicated that all of his three brothers are deceased. He indicated that his maternal grandmother is deceased. He indicated that his maternal grandfather is deceased. He indicated that his paternal grandmother is deceased. He indicated that his paternal grandfather is deceased. He indicated that his son is alive.  Social History    Social History   Socioeconomic History   Marital status: Married     Spouse name: Not  on file   Number of children: 1   Years of education: 10   Highest education level: Not on file  Occupational History   Occupation: Retired  Tobacco Use   Smoking status: Former    Current packs/day: 0.00    Average packs/day: 0.3 packs/day for 35.0 years (8.8 ttl pk-yrs)    Types: Cigarettes    Start date: 27    Quit date: 01/12/1978    Years since quitting: 44.8   Smokeless tobacco: Current    Types: Chew   Tobacco comments:    Started smoking at age 39  Vaping Use   Vaping status: Never Used  Substance and Sexual Activity   Alcohol use: No    Comment: Prior alcoholic, sober since 2002   Drug use: No   Sexual activity: Not Currently  Other Topics Concern   Not on file  Social History Narrative   Retired lives at home w/ his wife   1 daughter   Right-handed    Caffeine: drinks a lot of coffee    Former smoker, former alcoholic abstinent for many years   Social Determinants of Corporate investment banker Strain: Low Risk  (05/15/2021)   Overall Financial Resource Strain (CARDIA)    Difficulty of Paying Living Expenses: Not hard at all  Food Insecurity: No Food Insecurity (05/22/2022)   Hunger Vital Sign    Worried About Running Out of Food in the Last Year: Never true    Ran Out of Food in the Last Year: Never true  Transportation Needs: No Transportation Needs (05/22/2022)   PRAPARE - Administrator, Civil Service (Medical): No    Lack of Transportation (Non-Medical): No  Physical Activity: Insufficiently Active (05/15/2021)   Exercise Vital Sign    Days of Exercise per Week: 1 day    Minutes of Exercise per Session: 50 min  Stress: No Stress Concern Present (05/15/2021)   [employer] of Occupational Health - Occupational Stress Questionnaire    Feeling of Stress : Not at all  Social Connections: Socially Integrated (05/15/2021)   Social Connection and Isolation Panel [NHANES]    Frequency of Communication with Friends and Family:  More than three times a week    Frequency of Social Gatherings with Friends and Family: More than three times a week    Attends Religious Services: More than 4 times per year    Active Member of [employer] or Organizations: Yes    Attends Engineer, structural: More than 4 times per year    Marital Status: Married  Catering manager Violence: Not At Risk (05/15/2021)   Humiliation, Afraid, Rape, and Kick questionnaire    Fear of Current or Ex-Partner: No    Emotionally Abused: No    Physically Abused: No    Sexually Abused: No     Review of Systems    General:  No chills, fever, night sweats or weight changes.  Cardiovascular:  No chest pain, dyspnea on exertion, edema, orthopnea, palpitations, paroxysmal nocturnal dyspnea. Dermatological: No rash, lesions/masses Respiratory: No cough, dyspnea Urologic: No hematuria, dysuria Abdominal:   No nausea, vomiting, diarrhea, bright red blood per rectum, melena, or hematemesis Neurologic:  No visual changes, wkns, changes in mental status. All other systems reviewed and are otherwise negative except as noted above.  Physical Exam    VS:  There were no vitals taken for this visit. , BMI There is no height or weight on file to calculate BMI. GEN: Well nourished,  well developed, in no acute distress. HEENT: normal. Neck: Supple, no JVD, carotid bruits, or masses. Cardiac: RRR, no murmurs, rubs, or gallops. No clubbing, cyanosis, edema.  Radials/DP/PT 2+ and equal bilaterally.  Respiratory:  Respirations regular and unlabored, clear to auscultation bilaterally. GI: Soft, nontender, nondistended, BS + x 4. MS: no deformity or atrophy. Skin: warm and dry, no rash. Neuro:  Strength and sensation are intact. Psych: Normal affect.  Accessory Clinical Findings    Recent Labs: 08/20/2022: Brain Natriuretic Peptide 281; Magnesium 1.9 08/27/2022: ALT 4; Hemoglobin 10.0; Platelets 472.0; TSH 7.46 10/13/2022: BUN 26; Creatinine, Ser 1.06; NT-Pro BNP  943; Potassium 4.6; Sodium 143   Recent Lipid Panel    Component Value Date/Time   CHOL 117 08/27/2022 1419   TRIG 59.0 08/27/2022 1419   HDL 48.90 08/27/2022 1419   CHOLHDL 2 08/27/2022 1419   VLDL 11.8 08/27/2022 1419   LDLCALC 56 08/27/2022 1419    No BP recorded.  {Refresh Note OR Click here to enter BP  :1}***    ECG personally reviewed by me today-***  EKG 05/08/22 normal sinus rhythm left axis deviation anterior septal infarct undetermined age 62 bpm- No acute changes  Echocardiogram 08/29/2020  IMPRESSIONS     1. Left ventricular ejection fraction, by estimation, is 65 to 70%. The  left ventricle has normal function. The left ventricle has no regional  wall motion abnormalities. Left ventricular diastolic parameters are  consistent with Grade I diastolic  dysfunction (impaired relaxation).   2. Right ventricular systolic function is normal. The right ventricular  size is normal. There is mildly elevated pulmonary artery systolic  pressure. The estimated right ventricular systolic pressure is 43.0 mmHg.   3. Right atrial size was mildly dilated.   4. The mitral valve is grossly normal. No evidence of mitral valve  regurgitation. No evidence of mitral stenosis.   5. The aortic valve is tricuspid. Aortic valve regurgitation is not  visualized. No aortic stenosis is present.   6. The inferior vena cava is dilated in size with >50% respiratory  variability, suggesting right atrial pressure of 8 mmHg.   Comparison(s): Changes from prior study are noted. EF unchanged. G1DD.  Elevated RVSP~43 mmHG.   FINDINGS   Left Ventricle: Left ventricular ejection fraction, by estimation, is 65  to 70%. The left ventricle has normal function. The left ventricle has no  regional wall motion abnormalities. The left ventricular internal cavity  size was normal in size. There is   no left ventricular hypertrophy. Left ventricular diastolic parameters  are consistent with Grade I  diastolic dysfunction (impaired relaxation).   Right Ventricle: The right ventricular size is normal. No increase in  right ventricular wall thickness. Right ventricular systolic function is  normal. There is mildly elevated pulmonary artery systolic pressure. The  tricuspid regurgitant velocity is 2.96   m/s, and with an assumed right atrial pressure of 8 mmHg, the estimated  right ventricular systolic pressure is 43.0 mmHg.   Left Atrium: Left atrial size was normal in size.   Right Atrium: Right atrial size was mildly dilated.   Pericardium: Trivial pericardial effusion is present. Presence of  pericardial fat pad.   Mitral Valve: The mitral valve is grossly normal. No evidence of mitral  valve stenosis.   Tricuspid Valve: The tricuspid valve is grossly normal. Tricuspid valve  regurgitation is trivial. No evidence of tricuspid stenosis.   Aortic Valve: The aortic valve is tricuspid. Aortic valve regurgitation is  not visualized. No  aortic stenosis is present.   Pulmonic Valve: The pulmonic valve was grossly normal. Pulmonic valve  regurgitation is not visualized. No evidence of pulmonic stenosis.   Aorta: The aortic root and ascending aorta are structurally normal, with  no evidence of dilitation.   Venous: The right lower pulmonary vein is normal. The inferior vena cava  is dilated in size with greater than 50% respiratory variability,  suggesting right atrial pressure of 8 mmHg.    Assessment & Plan   1.  HFpEF- Weight stable. Ankle edema.  Echocardiogram 08/29/2020 showed LVEF of 65-70%, G1 DD, mildly dilated right atria. Heart healthy low-sodium diet Daily weights Continue furosemide, lisinopril Elevate lower extremities when not active  Paroxysmal atrial fibrillation-***BPM.  Rate controlled.  Compliance with apixaban and denies bleeding issues. Avoid triggers- reviewed Continue apixaban  Essential hypertension-BP today 12***8/60. Maintain blood pressure  log Continue current medical therapy Low-sodium diet  Hyperlipidemia- 08/27/2022: Cholesterol 117; HDL 48.90; LDL Cholesterol 56; Triglycerides 59.0; VLDL 11.8. CHA2DS2-VASc Score = 8 This indicates a 10.8% annual risk of stroke. The patient's score is based upon: CHF, HTN, Diabetes ,CVA,vascular,Age High-fiber diet Continue atorvastatin Increase physical activity as tolerated  Carotid artery disease-denies episodes of lightheadedness, presyncope or syncope.  Underwent left CEA 2002.  Carotid Dopplers 12/11/2019 showed no significant carotid stenosis with less than 50% bilateral ICA. Continue atorvastatin  History of pleural effusion-no increased work of breathing or DOE.  Has had recurrent thoracentesis.  Cytology has been negative. Follows with PCP/pulmonology    Disposition: Follow-up with Dr. Tenny Craw or APP in 6-8 months.   Thomasene Ripple. Hailie Searight NP-C     11/13/2022, 10:10 AM [facility] [address] Office [phone] Fax [phone]    I spent 14*** minutes examining this patient, reviewing medications, and using patient centered shared decision making involving her cardiac care.  Prior to her visit I spent greater than 20 minutes reviewing her past medical history,  medications, and prior cardiac tests.

## 2022-11-17 ENCOUNTER — Ambulatory Visit: Payer: Medicare HMO | Admitting: General Practice

## 2022-11-18 ENCOUNTER — Other Ambulatory Visit (HOSPITAL_COMMUNITY): Payer: Self-pay

## 2022-11-18 ENCOUNTER — Other Ambulatory Visit: Payer: Self-pay

## 2022-11-19 ENCOUNTER — Other Ambulatory Visit: Payer: Self-pay

## 2022-11-19 ENCOUNTER — Other Ambulatory Visit (HOSPITAL_COMMUNITY): Payer: Self-pay

## 2022-11-20 ENCOUNTER — Other Ambulatory Visit (HOSPITAL_COMMUNITY): Payer: Self-pay

## 2022-11-20 ENCOUNTER — Other Ambulatory Visit: Payer: Self-pay

## 2022-11-21 ENCOUNTER — Other Ambulatory Visit (HOSPITAL_COMMUNITY): Payer: Self-pay

## 2022-11-23 ENCOUNTER — Encounter: Payer: Self-pay | Admitting: Medical

## 2022-11-23 ENCOUNTER — Ambulatory Visit (INDEPENDENT_AMBULATORY_CARE_PROVIDER_SITE_OTHER): Payer: Medicare HMO | Admitting: Medical

## 2022-11-23 VITALS — BP 180/80 | HR 65 | Temp 98.5°F | Ht 71.0 in | Wt 134.5 lb

## 2022-11-23 DIAGNOSIS — I480 Paroxysmal atrial fibrillation: Secondary | ICD-10-CM

## 2022-11-23 DIAGNOSIS — I10 Essential (primary) hypertension: Secondary | ICD-10-CM | POA: Diagnosis not present

## 2022-11-23 DIAGNOSIS — I5033 Acute on chronic diastolic (congestive) heart failure: Secondary | ICD-10-CM | POA: Diagnosis not present

## 2022-11-23 DIAGNOSIS — D649 Anemia, unspecified: Secondary | ICD-10-CM | POA: Diagnosis not present

## 2022-11-23 DIAGNOSIS — R739 Hyperglycemia, unspecified: Secondary | ICD-10-CM | POA: Diagnosis not present

## 2022-11-23 NOTE — Patient Instructions (Addendum)
1. Anemia, unspecified type -recheck lab below - CBC w/Diff - Iron  2. Essential hypertension -bp high but no cardiac or neurologic signs/symptoms. Neurologic exam grossly normal. Please take all your blood pressure meds today.amlodiine, lisinopril and minoxidil Then resume daily and update me by my chart tomorrow afternoon. If any cardiac or neurologic signs/symptoms be seen in ED.  3. PAF (paroxysmal atrial fibrillation) (HCC) and  Acute on chronic diastolic CHF (congestive heart failure) (HCC) -keep upcoming appt with cardiologist next week  4. Elevated blood sugar -maintain low sugar diet. Last A1c less than 3 month ago showed sugar average of about 108.  Follow up date to be determined after lab review.

## 2022-11-23 NOTE — Progress Notes (Signed)
Subjective:    Patient ID: [name], male    DOB: May 20, 1929, 89 y.o.   MRN: [id number]  HPI  Pt in for follow up.   Last visit in August  "1. PAF (paroxysmal atrial fibrillation) (HCC) Rate controlled. Continue current cardiac meds as we discussed today. - Comp Met (CMET); Future - B Nat Peptide; Future - Ambulatory referral to Cardiology   2. Essential hypertension - Bp reasonably controlled today.    3. Acute on chronic diastolic CHF (congestive heart failure) (HCC) overall appears clinically stable and some improved since Saturday. -take lasix today when you get home. Labs and cxr tomorrow as our lab was closed by time I saw you.  - DG Chest 2 View; Future - Comp Met (CMET); Future - B Nat Peptide; Future - Ambulatory referral to Cardiology to follow up as looks like not seen for some time. I think would benefit from echo."  Last saw cardilogsit in sept  "1 HTN   BP is controlled   Keep on current regimen     2  PAF  Pt clinically in SR  He denies palpitations   Continue Eliquis       3 HFpEF  Volume does appear to be mildly increased  I would  recomm he take 20 lasix 2x per week Check BMET and BNP today    4 Pulmonary  Wheezing today     He has had in past  Recom using inhaler regularly        3  CV dz   Mild dz bilaterally    4  Hx CVAs   Seen in CT  Continue  anticoagulation    5  HL  Keep on lipitor  LDL  HDL 49"  On review bmp 10-13-22 looked overall good. Bnp- was 943.  Since sept using lasix 20 mg twice a week.  Bp high today 180/80. No cardiac or neurologic signs/symptoms.  Pt did not take bp med today. Pt is on amlodipine 5 mg, lisinopril 5 mg daily and minoxidil 10 mg twice daily. Usually does not miss any dosages   On review hx of anemia.     Review of Systems  Constitutional:  Negative for chills, diaphoresis and fatigue.  Respiratory:  Negative for cough, chest tightness, shortness of breath and wheezing.   Cardiovascular:  Negative  for chest pain and palpitations.  Gastrointestinal:  Negative for abdominal pain, nausea and vomiting.  Musculoskeletal:  Negative for back pain and joint swelling.  Neurological:  Negative for dizziness, seizures, syncope, weakness and light-headedness.  Hematological:  Negative for adenopathy. Does not bruise/bleed easily.  Psychiatric/Behavioral:  Negative for behavioral problems and confusion.     Past Medical History:  Diagnosis Date   Acute bronchitis 06/10/2015   Allergic rhinitis    Anemia    Asthma    Carotid stenosis    a. s/p Left CEA 2002;  b. Carotid US 3/16:  patent R CEA, L < 40%   Chronic pain syndrome    Left shoulder, back   Closed fracture of neck of right femur (HCC)    Diverticulosis    Dyslipidemia    Epigastric pain 04/05/2016   Esophageal stricture    Distal, benign   Femur fracture, left (HCC) 10/27/2018   GERD (gastroesophageal reflux disease)    Hemorrhoids    HTN (hypertension)    Negative renal duplex 07-22-11   Hx of echocardiogram    a. Echo 10/11: mod LVH, EF 55-60%  Pleural effusion 07/17/2020   Prostate cancer (HCC) 2000   Seed XRT   S/P thoracentesis    Spondylosis, cervical, with myelopathy 11/26/2015   Stroke (HCC) 08/1999   right thalamic - on chronic Plavix/ASA     Social History   Socioeconomic History   Marital status: Married    Spouse name: Not on file   Number of children: 1   Years of education: 10   Highest education level: Not on file  Occupational History   Occupation: Retired  Tobacco Use   Smoking status: Former    Current packs/day: 0.00    Average packs/day: 0.3 packs/day for 35.0 years (8.8 ttl pk-yrs)    Types: Cigarettes    Start date: 51    Quit date: 01/12/1978    Years since quitting: 44.8   Smokeless tobacco: Current    Types: Chew   Tobacco comments:    Started smoking at age 80  Vaping Use   Vaping status: Never Used  Substance and Sexual Activity   Alcohol use: No    Comment: Prior  alcoholic, sober since 2002   Drug use: No   Sexual activity: Not Currently  Other Topics Concern   Not on file  Social History Narrative   Retired lives at home w/ his wife   1 daughter   Right-handed    Caffeine: drinks a lot of coffee    Former smoker, former alcoholic abstinent for many years   Social Determinants of Corporate investment banker Strain: Low Risk  (05/15/2021)   Overall Financial Resource Strain (CARDIA)    Difficulty of Paying Living Expenses: Not hard at all  Food Insecurity: No Food Insecurity (05/22/2022)   Hunger Vital Sign    Worried About Running Out of Food in the Last Year: Never true    Ran Out of Food in the Last Year: Never true  Transportation Needs: No Transportation Needs (05/22/2022)   PRAPARE - Administrator, Civil Service (Medical): No    Lack of Transportation (Non-Medical): No  Physical Activity: Insufficiently Active (05/15/2021)   Exercise Vital Sign    Days of Exercise per Week: 1 day    Minutes of Exercise per Session: 50 min  Stress: No Stress Concern Present (05/15/2021)   [employer] of Occupational Health - Occupational Stress Questionnaire    Feeling of Stress : Not at all  Social Connections: Socially Integrated (05/15/2021)   Social Connection and Isolation Panel [NHANES]    Frequency of Communication with Friends and Family: More than three times a week    Frequency of Social Gatherings with Friends and Family: More than three times a week    Attends Religious Services: More than 4 times per year    Active Member of [employer] or Organizations: Yes    Attends Banker Meetings: More than 4 times per year    Marital Status: Married  Catering manager Violence: Not At Risk (05/15/2021)   Humiliation, Afraid, Rape, and Kick questionnaire    Fear of Current or Ex-Partner: No    Emotionally Abused: No    Physically Abused: No    Sexually Abused: No    Past Surgical History:  Procedure Laterality Date    ANTERIOR APPROACH HEMI HIP ARTHROPLASTY Right 03/15/2021   Procedure: ANTERIOR APPROACH HEMI HIP ARTHROPLASTY;  Surgeon: Samson Frederic, MD;  Location: [facility] OR;  Service: Orthopedics;  Laterality: Right;   APPENDECTOMY  1953   CAROTID ENDARTERECTOMY Right 01/2000   CATARACT  EXTRACTION Bilateral    CERVICAL DISC SURGERY  8/07   CHEST TUBE INSERTION Left 11/11/2020   Procedure: INSERTION PLEURAL DRAINAGE CATHETER;  Surgeon: Josephine Igo, DO;  Location: [facility] ENDOSCOPY;  Service: Pulmonary;  Laterality: Left;  Indwelling Pleural Catheter   HIP ARTHROPLASTY Left 10/24/2018   Procedure: ARTHROPLASTY BIPOLAR HIP (HEMIARTHROPLASTY);  Surgeon: Durene Romans, MD;  Location: [facility] OR;  Service: Orthopedics;  Laterality: Left;   INSERTION PROSTATE RADIATION SEED  2000   LUMBAR DISC SURGERY  1990s   NASAL SINUS SURGERY     x 3   REMOVAL OF PLEURAL DRAINAGE CATHETER N/A 12/24/2020   Procedure: MINOR REMOVAL OF PLEURAL DRAINAGE CATHETER;  Surgeon: Josephine Igo, DO;  Location: [facility] ENDOSCOPY;  Service: Cardiopulmonary;  Laterality: N/A;   THORACENTESIS N/A 08/15/2020   Procedure: THORACENTESIS;  Surgeon: Josephine Igo, DO;  Location: [facility] ENDOSCOPY;  Service: Pulmonary;  Laterality: N/A;    Family History  Problem Relation Age of Onset   Stroke Brother    Stroke Sister    Stroke Mother    Hyperlipidemia Mother    Hypertension Mother    Lung disease Father    Diabetes Son    Gout Son    Obesity Sister    Alcohol abuse Brother    Cancer Brother        lung, smoker    Allergies  Allergen Reactions   Hctz [Hydrochlorothiazide] Shortness Of Breath, Swelling and Other (See Comments)    "Swelling and dyspnea"    Hydralazine Shortness Of Breath and Other (See Comments)    Chest pain and GI issues also   Diovan [Valsartan] Other (See Comments)    Elevated potassium- Hyperkalemia   Metformin And Related Other (See Comments)    Reaction not recalled   Nsaids Other (See Comments)    Other than  Tylenol, he isn't to have these because he's on Eliquis   Other Other (See Comments)    Clonidine B/P patch = Caused redness   Prednisone Other (See Comments)    Suicidal thoughts   Spironolactone Swelling    Site of swelling not recalled   Codeine Itching and Nausea Only   Oxycodone-Acetaminophen Itching and Nausea Only    Current Outpatient Medications on File Prior to Visit  Medication Sig Dispense Refill   acetaminophen (TYLENOL) 500 MG tablet Take 1,000 mg by mouth every 6 (six) hours as needed for mild pain or headache.     albuterol (VENTOLIN HFA) 108 (90 Base) MCG/ACT inhaler Inhale 1-2 puffs into the lungs every 6 (six) hours as needed for wheezing or shortness of breath. 18 g 1   amLODipine (NORVASC) 5 MG tablet Take 2 tablets (10 mg total) by mouth daily (need office visit) 180 tablet 3   apixaban (ELIQUIS) 5 MG TABS tablet Take 1 tablet (5 mg) by mouth 2 times daily. 180 tablet 3   atorvastatin (LIPITOR) 40 MG tablet Take 1 tablet (40 mg total) by mouth daily. 90 tablet 0   Cholecalciferol (VITAMIN D3 PO) Take 1 tablet by mouth in the morning. Unsure of how many units     docusate sodium (COLACE) 100 MG capsule Take 300 mg by mouth 2 (two) times daily.     Ferrous Fumarate (HEMOCYTE - 106 MG FE) 324 (106 Fe) MG TABS tablet Take 1 tablet (106 mg of iron total) by mouth 2 times daily. 30 tablet 2   furosemide (LASIX) 20 MG tablet Take 1 tablet (20 mg total) by mouth 2 (  two) times a week. 30 tablet 3   furosemide (LASIX) 40 MG tablet 40 mg qday x 3 days. Then prn SOB/LE edema. (Patient taking differently: Take 40 mg by mouth. 40 mg qday x 3 days. Then prn SOB/LE edema.) 30 tablet 0   gabapentin (NEURONTIN) 300 MG capsule Take 1 capsule (300 mg total) by mouth 3 (three) times daily. (Patient taking differently: Take 300 mg by mouth at bedtime.) 270 capsule 1   lisinopril (ZESTRIL) 5 MG tablet Take 1 tablet (5 mg) by mouth daily. 90 tablet 3   metoCLOPramide (REGLAN) 5 MG tablet Take  1 tablet (5 mg total) by mouth at bedtime. Call for appointment for future refills. 30 tablet 3   minoxidil (LONITEN) 10 MG tablet Take 1 tablet (10 mg) by mouth 2 times daily. 180 tablet 3   ondansetron (ZOFRAN ODT) 8 MG disintegrating tablet Dissolve 1 tablet by mouth every 8 hours as needed for nausea or vomiting. 20 tablet 0   polyvinyl alcohol (LIQUIFILM TEARS) 1.4 % ophthalmic solution Place 1 drop into both eyes 5 (five) times daily as needed for dry eyes.     sennosides-docusate sodium (SENOKOT-S) 8.6-50 MG tablet Take 1-2 tablets by mouth See admin instructions. Take 1 tablet in the morning & take 2 tablet at night     traMADol (ULTRAM) 50 MG tablet Take 1 tablet (50 mg) by mouth 3 times daily as needed. 90 tablet 0   triamcinolone cream (KENALOG) 0.1 % Apply topically to affected areas of the skin daily as needed. 454 g 1   No current facility-administered medications on file prior to visit.    BP (!) 180/80   Pulse 65   Temp 98.5 F (36.9 C) (Oral)   Ht 5\' 11"  (1.803 m)   Wt 134 lb 8 oz (61 kg)   SpO2 98%   BMI 18.76 kg/m        Objective:   Physical Exam  General Mental Status- Alert. General Appearance- Not in acute distress.   Skin General: Color- Normal Color. Moisture- Normal Moisture.  Neck Carotid Arteries- Normal color. Moisture- Normal Moisture. No carotid bruits. No JVD.  Chest and Lung Exam Auscultation: Breath Sounds:-Normal.  Cardiovascular Auscultation:Rythm- Regular. Murmurs & Other Heart Sounds:Auscultation of the heart reveals- No Murmurs.  Abdomen Inspection:-Inspeection Normal. Palpation/Percussion:Note:No mass. Palpation and Percussion of the abdomen reveal- Non Tender, Non Distended + BS, no rebound or guarding.   Neurologic Cranial Nerve exam:- CN III-XII intact(No nystagmus), symmetric smile. Strength:- 5/5 equal and symmetric strength both upper and lower extremities.       Assessment & Plan:   Patient Instructions  1.  Anemia, unspecified type -recheck lab below - CBC w/Diff - Iron  2. Essential hypertension -bp high but no cardiac or neurologic signs/symptoms. Neurologic exam grossly normal. Please take all your blood pressure meds today.amlodiine, lisinopril and minoxidil Then resume daily and update me by my chart tomorrow afternoon. If any cardiac or neurologic signs/symptoms be seen in ED.  3. PAF (paroxysmal atrial fibrillation) (HCC) and  Acute on chronic diastolic CHF (congestive heart failure) (HCC) -keep upcoming appt with cardiologist next week  4. Elevated blood sugar -maintain low sugar diet. Last A1c less than 3 month ago showed sugar average of about 108.  Follow up date to be determined after lab review.       Esperanza Richters, PA-C

## 2022-11-24 ENCOUNTER — Ambulatory Visit (INDEPENDENT_AMBULATORY_CARE_PROVIDER_SITE_OTHER): Payer: Medicare HMO | Admitting: Family Medicine

## 2022-11-24 ENCOUNTER — Encounter: Payer: Self-pay | Admitting: Family Medicine

## 2022-11-24 ENCOUNTER — Ambulatory Visit (HOSPITAL_BASED_OUTPATIENT_CLINIC_OR_DEPARTMENT_OTHER)
Admission: RE | Admit: 2022-11-24 | Discharge: 2022-11-24 | Disposition: A | Discharge status: Home / Self Care | Payer: Medicare HMO | Source: Ambulatory Visit | Attending: Family Medicine | Admitting: Family Medicine

## 2022-11-24 VITALS — BP 115/40 | HR 96 | Temp 97.7°F | Resp 18 | Ht 70.0 in | Wt 133.4 lb

## 2022-11-24 DIAGNOSIS — D649 Anemia, unspecified: Secondary | ICD-10-CM

## 2022-11-24 DIAGNOSIS — E782 Mixed hyperlipidemia: Secondary | ICD-10-CM | POA: Diagnosis not present

## 2022-11-24 DIAGNOSIS — R7989 Other specified abnormal findings of blood chemistry: Secondary | ICD-10-CM | POA: Diagnosis not present

## 2022-11-24 DIAGNOSIS — R531 Weakness: Secondary | ICD-10-CM | POA: Diagnosis not present

## 2022-11-24 DIAGNOSIS — E559 Vitamin D deficiency, unspecified: Secondary | ICD-10-CM

## 2022-11-24 DIAGNOSIS — R062 Wheezing: Secondary | ICD-10-CM

## 2022-11-24 DIAGNOSIS — E8809 Other disorders of plasma-protein metabolism, not elsewhere classified: Secondary | ICD-10-CM | POA: Diagnosis not present

## 2022-11-24 DIAGNOSIS — M542 Cervicalgia: Secondary | ICD-10-CM

## 2022-11-24 DIAGNOSIS — R519 Headache, unspecified: Secondary | ICD-10-CM

## 2022-11-24 DIAGNOSIS — R634 Abnormal weight loss: Secondary | ICD-10-CM

## 2022-11-24 DIAGNOSIS — J90 Pleural effusion, not elsewhere classified: Secondary | ICD-10-CM | POA: Diagnosis not present

## 2022-11-24 DIAGNOSIS — Q677 Pectus carinatum: Secondary | ICD-10-CM | POA: Diagnosis not present

## 2022-11-24 LAB — COMPREHENSIVE METABOLIC PANEL
ALT: 5 U/L (ref 0–53)
AST: 13 U/L (ref 0–37)
Albumin: 4.1 g/dL (ref 3.5–5.2)
Alkaline Phosphatase: 52 U/L (ref 39–117)
BUN: 25 mg/dL — ABNORMAL HIGH (ref 6–23)
CO2: 29 meq/L (ref 19–32)
Calcium: 9 mg/dL (ref 8.4–10.5)
Chloride: 105 meq/L (ref 96–112)
Creatinine, Ser: 1.33 mg/dL (ref 0.40–1.50)
GFR: 46.81 mL/min — ABNORMAL LOW (ref >60.00)
Glucose, Bld: 89 mg/dL (ref 70–99)
Potassium: 4.9 meq/L (ref 3.5–5.1)
Sodium: 142 meq/L (ref 135–145)
Total Bilirubin: 0.4 mg/dL (ref 0.2–1.2)
Total Protein: 6.6 g/dL (ref 6.0–8.3)

## 2022-11-24 LAB — IRON: Iron: 70 ug/dL (ref 42–165)

## 2022-11-24 LAB — CBC WITH DIFFERENTIAL/PLATELET
Basophils Absolute: 0.1 10*3/uL (ref 0.0–0.1)
Basophils Relative: 0.6 % (ref 0.0–3.0)
Eosinophils Absolute: 0.1 10*3/uL (ref 0.0–0.7)
Eosinophils Relative: 1.5 % (ref 0.0–5.0)
HCT: 37.9 % — ABNORMAL LOW (ref 39.0–52.0)
Hemoglobin: 12.9 g/dL — ABNORMAL LOW (ref 13.0–17.0)
Lymphocytes Relative: 12.4 % (ref 12.0–46.0)
Lymphs Abs: 1.2 10*3/uL (ref 0.7–4.0)
MCHC: 34 g/dL (ref 30.0–36.0)
MCV: 91.4 fL (ref 78.0–100.0)
Monocytes Absolute: 0.6 10*3/uL (ref 0.1–1.0)
Monocytes Relative: 6.1 % (ref 3.0–12.0)
Neutro Abs: 7.6 10*3/uL (ref 1.4–7.7)
Neutrophils Relative %: 79.4 % — ABNORMAL HIGH (ref 43.0–77.0)
Platelets: 305 10*3/uL (ref 150.0–400.0)
RBC: 4.15 Mil/uL — ABNORMAL LOW (ref 4.22–5.81)
RDW: 15.8 % — ABNORMAL HIGH (ref 11.5–15.5)
WBC: 9.6 10*3/uL (ref 4.0–10.5)

## 2022-11-24 NOTE — Progress Notes (Signed)
Subjective:    Patient ID: [name], male    DOB: 30-Jul-1929, 89 y.o.   MRN: [id number]  Chief Complaint  Patient presents with   Follow-up    HPI Discussed the use of AI scribe software for clinical note transcription with the patient, who gave verbal consent to proceed.  History of Present Illness   The patient, a 89 year old male with a history of stroke in 2003, presents with worsening numbness in his left arm and left arm. The numbness, which started about a month ago, has been persistent and is associated with pain. He is having trouble holding things with his left hand. The patient denies any recent falls or trauma that could have precipitated these symptoms. He also reports occasional headaches, which are not severe but are a concern. The patient's spouse reports that the patient has been losing weight and has trouble sleeping. The patient also mentions difficulty swallowing, which occurs sporadically. The patient's spouse also reports that the patient has been experiencing fluid overload and takes Lasix twice a week.        Past Medical History:  Diagnosis Date   Acute bronchitis 06/10/2015   Allergic rhinitis    Anemia    Asthma    Carotid stenosis    a. s/p Left CEA 2002;  b. Carotid US 3/16:  patent R CEA, L < 40%   Chronic pain syndrome    Left shoulder, back   Closed fracture of neck of right femur (HCC)    Diverticulosis    Dyslipidemia    Epigastric pain 04/05/2016   Esophageal stricture    Distal, benign   Femur fracture, left (HCC) 10/27/2018   GERD (gastroesophageal reflux disease)    Hemorrhoids    HTN (hypertension)    Negative renal duplex 07-22-11   Hx of echocardiogram    a. Echo 10/11: mod LVH, EF 55-60%   Pleural effusion 07/17/2020   Prostate cancer (HCC) 2000   Seed XRT   S/P thoracentesis    Spondylosis, cervical, with myelopathy 11/26/2015   Stroke (HCC) 08/1999   right thalamic - on chronic Plavix/ASA    Past Surgical History:   Procedure Laterality Date   ANTERIOR APPROACH HEMI HIP ARTHROPLASTY Right 03/15/2021   Procedure: ANTERIOR APPROACH HEMI HIP ARTHROPLASTY;  Surgeon: Samson Frederic, MD;  Location: [facility] OR;  Service: Orthopedics;  Laterality: Right;   APPENDECTOMY  1953   CAROTID ENDARTERECTOMY Right 01/2000   CATARACT EXTRACTION Bilateral    CERVICAL DISC SURGERY  8/07   CHEST TUBE INSERTION Left 11/11/2020   Procedure: INSERTION PLEURAL DRAINAGE CATHETER;  Surgeon: Josephine Igo, DO;  Location: [facility] END[facility]COPY;  Service: Pulmonary;  Laterality: Left;  Indwelling Pleural Catheter   HIP ARTHROPLASTY Left 10/24/2018   Procedure: ARTHROPLASTY BIPOLAR HIP (HEMIARTHROPLASTY);  Surgeon: Durene Romans, MD;  Location: [facility];  Service: Orthopedics;  Laterality: Left;   INSERTION PROSTATE RADIATION SEED  2000   LUMBAR DISC SURGERY  1990s   NASAL SINUS SURGERY     x 3   REMOVAL OF PLEURAL DRAINAGE CATHETER N/A 12/24/2020   Procedure: MINOR REMOVAL OF PLEURAL DRAINAGE CATHETER;  Surgeon: Josephine Igo, DO;  Location: [facility];  Service: Cardiopulmonary;  Laterality: N/A;   THORACENTESIS N/A 08/15/2020   Procedure: THORACENTESIS;  Surgeon: Josephine Igo, DO;  Location: [facility] END[facility]COPY;  Service: Pulmonary;  Laterality: N/A;    Family History  Problem Relation Age of Onset   Stroke Brother    Stroke Sister  Stroke Mother    Hyperlipidemia Mother    Hypertension Mother    Lung disease Father    Diabetes Son    Gout Son    Obesity Sister    Alcohol abuse Brother    Cancer Brother        lung, smoker    Social History   Socioeconomic History   Marital status: Married    Spouse name: Not on file   Number of children: 1   Years of education: 10   Highest education level: Not on file  Occupational History   Occupation: Retired  Tobacco Use   Smoking status: Former    Current packs/day: 0.00    Average packs/day: 0.3 packs/day for 35.0 years (8.8 ttl pk-yrs)    Types: Cigarettes    Start date:  30    Quit date: 01/12/1978    Years since quitting: 44.8   Smokeless tobacco: Current    Types: Chew   Tobacco comments:    Started smoking at age 82  Vaping Use   Vaping status: Never Used  Substance and Sexual Activity   Alcohol use: No    Comment: Prior alcoholic, sober since 2002   Drug use: No   Sexual activity: Not Currently  Other Topics Concern   Not on file  Social History Narrative   Retired lives at home w/ his wife   1 daughter   Right-handed    Caffeine: drinks a lot of coffee    Former smoker, former alcoholic abstinent for many years   Social Determinants of Corporate investment banker Strain: Low Risk  (05/15/2021)   Overall Financial Resource Strain (CARDIA)    Difficulty of Paying Living Expenses: Not hard at all  Food Insecurity: No Food Insecurity (05/22/2022)   Hunger Vital Sign    Worried About Running Out of Food in the Last Year: Never true    Ran Out of Food in the Last Year: Never true  Transportation Needs: No Transportation Needs (05/22/2022)   PRAPARE - Administrator, Civil Service (Medical): No    Lack of Transportation (Non-Medical): No  Physical Activity: Insufficiently Active (05/15/2021)   Exercise Vital Sign    Days of Exercise per Week: 1 day    Minutes of Exercise per Session: 50 min  Stress: No Stress Concern Present (05/15/2021)   [employer] of Occupational Health - Occupational Stress Questionnaire    Feeling of Stress : Not at all  Social Connections: Socially Integrated (05/15/2021)   Social Connection and Isolation Panel [NHANES]    Frequency of Communication with Friends and Family: More than three times a week    Frequency of Social Gatherings with Friends and Family: More than three times a week    Attends Religious Services: More than 4 times per year    Active Member of [employer] or Organizations: Yes    Attends Engineer, structural: More than 4 times per year    Marital Status: Married  Catering manager  Violence: Not At Risk (05/15/2021)   Humiliation, Afraid, Rape, and Kick questionnaire    Fear of Current or Ex-Partner: No    Emotionally Abused: No    Physically Abused: No    Sexually Abused: No    Outpatient Medications Prior to Visit  Medication Sig Dispense Refill   acetaminophen (TYLENOL) 500 MG tablet Take 1,000 mg by mouth every 6 (six) hours as needed for mild pain or headache.     albuterol (VENTOLIN  HFA) 108 (90 Base) MCG/ACT inhaler Inhale 1-2 puffs into the lungs every 6 (six) hours as needed for wheezing or shortness of breath. 18 g 1   amLODipine (NORVASC) 5 MG tablet Take 2 tablets (10 mg total) by mouth daily (need office visit) 180 tablet 3   apixaban (ELIQUIS) 5 MG TABS tablet Take 1 tablet (5 mg) by mouth 2 times daily. 180 tablet 3   atorvastatin (LIPITOR) 40 MG tablet Take 1 tablet (40 mg total) by mouth daily. 90 tablet 0   Cholecalciferol (VITAMIN D3 PO) Take 1 tablet by mouth in the morning. Unsure of how many units     docusate sodium (COLACE) 100 MG capsule Take 300 mg by mouth 2 (two) times daily.     Ferrous Fumarate (HEMOCYTE - 106 MG FE) 324 (106 Fe) MG TABS tablet Take 1 tablet (106 mg of iron total) by mouth 2 times daily. 30 tablet 2   furosemide (LASIX) 20 MG tablet Take 1 tablet (20 mg total) by mouth 2 (two) times a week. 30 tablet 3   furosemide (LASIX) 40 MG tablet 40 mg qday x 3 days. Then prn SOB/LE edema. (Patient taking differently: Take 40 mg by mouth. 40 mg qday x 3 days. Then prn SOB/LE edema.) 30 tablet 0   gabapentin (NEURONTIN) 300 MG capsule Take 1 capsule (300 mg total) by mouth 3 (three) times daily. (Patient taking differently: Take 300 mg by mouth at bedtime.) 270 capsule 1   lisinopril (ZESTRIL) 5 MG tablet Take 1 tablet (5 mg) by mouth daily. 90 tablet 3   metoCLOPramide (REGLAN) 5 MG tablet Take 1 tablet (5 mg total) by mouth at bedtime. Call for appointment for future refills. 30 tablet 3   minoxidil (LONITEN) 10 MG tablet Take 1 tablet  (10 mg) by mouth 2 times daily. 180 tablet 3   ondansetron (ZOFRAN ODT) 8 MG disintegrating tablet Dissolve 1 tablet by mouth every 8 hours as needed for nausea or vomiting. 20 tablet 0   polyvinyl alcohol (LIQUIFILM TEARS) 1.4 % ophthalmic solution Place 1 drop into both eyes 5 (five) times daily as needed for dry eyes.     sennosides-docusate sodium (SENOKOT-S) 8.6-50 MG tablet Take 1-2 tablets by mouth See admin instructions. Take 1 tablet in the morning & take 2 tablet at night     traMADol (ULTRAM) 50 MG tablet Take 1 tablet (50 mg) by mouth 3 times daily as needed. 90 tablet 0   triamcinolone cream (KENALOG) 0.1 % Apply topically to affected areas of the skin daily as needed. 454 g 1   No facility-administered medications prior to visit.    Allergies  Allergen Reactions   Hctz [Hydrochlorothiazide] Shortness Of Breath, Swelling and Other (See Comments)    "Swelling and dyspnea"    Hydralazine Shortness Of Breath and Other (See Comments)    Chest pain and GI issues also   Diovan [Valsartan] Other (See Comments)    Elevated potassium- Hyperkalemia   Metformin And Related Other (See Comments)    Reaction not recalled   Nsaids Other (See Comments)    Other than Tylenol, he isn't to have these because he's on Eliquis   Other Other (See Comments)    Clonidine B/P patch = Caused redness   Prednisone Other (See Comments)    Suicidal thoughts   Spironolactone Swelling    Site of swelling not recalled   Codeine Itching and Nausea Only   Oxycodone-Acetaminophen Itching and Nausea Only  Review of Systems  Constitutional:  Positive for malaise/fatigue and weight loss. Negative for fever.  HENT:  Negative for congestion.   Eyes:  Negative for blurred vision.  Respiratory:  Positive for cough and wheezing. Negative for shortness of breath.   Cardiovascular:  Negative for chest pain, palpitations and leg swelling.  Gastrointestinal:  Negative for abdominal pain, blood in stool and  nausea.  Genitourinary:  Negative for dysuria and frequency.  Musculoskeletal:  Positive for neck pain. Negative for falls.  Skin:  Negative for rash.  Neurological:  Positive for focal weakness and headaches. Negative for dizziness and loss of consciousness.  Endo/Heme/Allergies:  Negative for environmental allergies.  Psychiatric/Behavioral:  Negative for depression. The patient is not nervous/anxious.        Objective:    Physical Exam Vitals reviewed.  Constitutional:      Appearance: Normal appearance. He is not ill-appearing.  HENT:     Head: Normocephalic and atraumatic.     Nose: Nose normal.  Eyes:     Conjunctiva/sclera: Conjunctivae normal.  Cardiovascular:     Rate and Rhythm: Normal rate.     Pulses: Normal pulses.     Heart sounds: Normal heart sounds. No murmur heard. Pulmonary:     Effort: Pulmonary effort is normal.     Breath sounds: Wheezing present. No rhonchi.  Abdominal:     Palpations: Abdomen is soft. There is no mass.     Tenderness: There is no abdominal tenderness.  Musculoskeletal:     Cervical back: Normal range of motion.     Right lower leg: No edema.     Left lower leg: No edema.  Skin:    General: Skin is warm and dry.  Neurological:     General: No focal deficit present.     Mental Status: He is alert and oriented to person, place, and time.  Psychiatric:        Mood and Affect: Mood normal.     BP (!) 115/40 (BP Location: Left Arm, Patient Position: Sitting, Cuff Size: Normal)   Pulse 96   Temp 97.7 F (36.5 C) (Oral)   Resp 18   Ht 5\' 10"  (1.778 m)   Wt 133 lb 6.4 oz (60.5 kg)   SpO2 99%   BMI 19.14 kg/m  Wt Readings from Last 3 Encounters:  11/24/22 133 lb 6.4 oz (60.5 kg)  11/23/22 134 lb 8 oz (61 kg)  09/16/22 138 lb (62.6 kg)    Diabetic Foot Exam - Simple   No data filed    Lab Results  Component Value Date   WBC 9.6 11/23/2022   HGB 12.9 (L) 11/23/2022   HCT 37.9 (L) 11/23/2022   PLT 305.0 11/23/2022    GLUCOSE 89 11/23/2022   CHOL 117 08/27/2022   TRIG 59.0 08/27/2022   HDL 48.90 08/27/2022   LDLCALC 56 08/27/2022   ALT 5 11/23/2022   AST 13 11/23/2022   NA 142 11/23/2022   K 4.9 11/23/2022   CL 105 11/23/2022   CREATININE 1.33 11/23/2022   BUN 25 (H) 11/23/2022   CO2 29 11/23/2022   TSH 7.46 (H) 08/27/2022   PSA 0.00 (L) 04/17/2021   INR 1.4 (H) 10/19/2021   HGBA1C 5.6 08/27/2022    Lab Results  Component Value Date   TSH 7.46 (H) 08/27/2022   Lab Results  Component Value Date   WBC 9.6 11/23/2022   HGB 12.9 (L) 11/23/2022   HCT 37.9 (L) 11/23/2022   MCV  91.4 11/23/2022   PLT 305.0 11/23/2022   Lab Results  Component Value Date   NA 142 11/23/2022   K 4.9 11/23/2022   CO2 29 11/23/2022   GLUCOSE 89 11/23/2022   BUN 25 (H) 11/23/2022   CREATININE 1.33 11/23/2022   BILITOT 0.4 11/23/2022   ALKPHOS 52 11/23/2022   AST 13 11/23/2022   ALT 5 11/23/2022   PROT 6.6 11/23/2022   ALBUMIN 4.1 11/23/2022   CALCIUM 9.0 11/23/2022   ANIONGAP 11 08/15/2022   EGFR 66 10/13/2022   GFR 46.81 (L) 11/23/2022   Lab Results  Component Value Date   CHOL 117 08/27/2022   Lab Results  Component Value Date   HDL 48.90 08/27/2022   Lab Results  Component Value Date   LDLCALC 56 08/27/2022   Lab Results  Component Value Date   TRIG 59.0 08/27/2022   Lab Results  Component Value Date   CHOLHDL 2 08/27/2022   Lab Results  Component Value Date   HGBA1C 5.6 08/27/2022       Assessment & Plan:  Nonintractable headache, unspecified chronicity pattern, unspecified headache type -     CT HEAD WO CONTRAST ( ); Future  Left-sided weakness -     CT HEAD WO CONTRAST ( ); Future -     CT CERVICAL SPINE WO CONTRAST; Future  Vitamin D deficiency -     Comprehensive metabolic panel -     VITAMIN D 25 Hydroxy (Vit-D Deficiency, Fractures)  Hypoalbuminemia  Mixed hyperlipidemia -     Lipid panel  Abnormal TSH -     TSH -     T4, free -     T3, free  Weight  loss  Anemia, unspecified type -     Iron, TIBC and Ferritin Panel  Wheezing -     DG Chest 2 View; Future  Neck pain -     CT CERVICAL SPINE WO CONTRAST; Future    Assessment and Plan    Left-sided weakness and numbness Worsening of chronic symptoms since a stroke in 2003. No recent falls or incontinence. -Order CT scan of the head to assess for any changes or progression.  Neck pain History of neck surgery, worsening pain in cold weather. -Order CT scan of the neck to evaluate for any structural changes that could be contributing to the pain.  Unintentional weight loss Lost about six pounds recently. -Order lab work to evaluate for potential causes, including thyroid function tests (TSH and free T4) due next week. -Increase protein intake, consider protein supplements.  Sleep difficulties Difficulty initiating sleep, currently taking tramadol for sleep. -Recommend trying over-the-counter magnesium glycinate (2-400mg  in the evening) and L-tryptophan capsules for sleep.  Gastroesophageal reflux disease (GERD) Occasional difficulty swallowing, history of esophageal reflux. -Prescribe Prilosec as needed for symptoms.  Possible fluid overload Wheezing noted on exam, more pronounced on the left side. Currently taking Lasix 20mg  twice a week. -Order chest x-ray to evaluate for fluid accumulation. -Consider increasing Lasix dose depending on x-ray results.  Follow-up in 2-3 months.         Danise Edge, MD

## 2022-11-24 NOTE — Patient Instructions (Addendum)
Yellow split pea powder Whey Powder Collagen powder Flaxseed powder Egg white powder  Magnesium Glycinate 200-400 mg in evening L Tryptophan capsules for sleep  Protein Content in Foods Protein is a necessary nutrient in any diet. It helps build and repair muscles, bones, and skin. Depending on your overall health, you may need more or less protein in your diet. You are encouraged to eat a variety of protein foods to ensure that you get all the essential nutrients that are found in different protein foods. Talk with your health care provider or dietitian about how much protein you need each day and which sources of protein are best for you. Protein is especially important for: Repairing and making cells and tissues. Fighting infection. Providing energy. Growth and development. See the following list for the protein content of some common foods. What are tips for getting more protein in your diet? Try to replace processed carbohydrates with high-quality protein. Snack on nuts and seeds instead of chips. Replace baked desserts with [place] yogurt. Eat protein foods from both plant and animal sources. Replace red meat with seafood. Add beans and peas to salads, soups, and side dishes. Include a protein food with each meal and snack. Reading food labels You can find the amount of protein in a food item by looking at the nutrition facts label. Use the total grams listed to help you reach your daily goal. What foods are high in protein?  High-protein foods contain 4 grams (g) or more of protein per serving. They include: Grains Quinoa (cooked) -- 1 cup (185 g) has 8 g of protein. Whole wheat pasta (cooked) -- 1 cup (140 g) has 6 g of protein. Meat Beef, ground sirloin (cooked) -- 3 oz (85 g) has 24 g of protein. Chicken breast, boneless and skinless (cooked) -- 3 oz (85 g) has 25 g of protein. Egg -- 1 egg has 6 g of protein. Fish, filet (cooked) -- 1 oz (28 g) has 6-7 g of  protein. Lamb (cooked) -- 3 oz (85 g) has 24 g of protein. Pork tenderloin (cooked) -- 3 oz (85 g) has 23 g of protein. Tuna (canned in water) -- 3 oz (85 g) has 20 g of protein. Dairy Cottage cheese --  cup (114 g) has 13.4 g of protein. Milk -- 1 cup (237 mL) has 8 g of protein. Cheese (hard) -- 1 oz (28 g) has 7 g of protein. Yogurt, regular -- 6 oz (170 g) has 8 g of protein. Greek yogurt -- 6 oz (200 g) has 18 g protein. Plant protein Garbanzo beans (canned or cooked) --  cup (130 g) has 6-7 g of protein. Kidney beans (canned or cooked) --  cup (130 g) has 6-7 g of protein. Nuts (peanuts, pistachios, almonds) -- 1 oz (28 g) has 6 g of protein. Peanut butter -- 1 oz (32 g) has 7-8 g of protein. Pumpkin seeds -- 1 oz (28 g) has 8.5 g of protein. Soybeans (roasted) -- 1 oz (28 g) has 8 g of protein. Soybeans (cooked) --  cup (90 g) has 11 g of protein. Soy milk -- 1 cup (250 mL) has 5-10 g of protein. Soy or vegetable patty -- 1 patty has 11 g of protein. Sunflower seeds -- 1 oz (28 g) has 5.5 g of protein. Buckwheat -- 1 oz (33 g) has 4.3 g of protein. Tofu (firm) --  cup (124 g) has 20 g of protein. Tempeh --  cup (83 g)  has 16 g of protein. The items listed above may not be a complete list of foods high in protein. Actual amounts of protein may differ depending on processing. Contact a dietitian for more information. What foods are low in protein?  Low-protein foods contain 3 grams (g) or less of protein per serving. They include: Fruits Fruit or vegetable juice --  cup (125 mL) has 1 g of protein. Vegetables Beets (raw or cooked) --  cup (68 g) has 1.5 g of protein. Broccoli (raw or cooked) --  cup (44 g) has 2 g of protein. Collard greens (raw or cooked) --  cup (42 g) has 2 g of protein. Green beans (raw or cooked) --  cup (83 g) has 1 g of protein. Green peas (canned) --  cup (80 g) has 3.5 g of protein. Potato (baked with skin) -- 1 medium potato (173 g) has  3 g of protein. Spinach (cooked) --  cup (90 g) has 3 g of protein. Squash (cooked) --  cup (90 g) has 1.5 g of protein. Avocado -- 1 cup (146 g) has 2.7 g of protein. Grains Bran cereal --  cup (30 g) has 2-3 g of protein. Bread -- 1 slice has 2.5 g of protein. Corn (fresh or cooked) --  cup (77 g) has 2 g of protein. Flour tortilla -- One 6-inch (15 cm) tortilla has 2.5 g of protein. Muffins -- 1 small muffin (2 oz or 57 g) has 3 g of protein. Oatmeal (cooked) --  cup (40 g) has 3 g of protein. Rice (cooked) --  cup (79 g) has 2.5-3.5 g of protein. Dairy Cream cheese -- 1 oz (29 g) has 2 g of protein. Creamer (half-and-half) -- 1 oz (29 mL) has 1 g of protein. Frozen yogurt --  cup (72 g) has 3 g of protein. Sour cream --  cup (75 g) has 2.5 g of protein. The items listed above may not be a complete list of foods low in protein. Actual amounts of protein may differ depending on processing. Contact a dietitian for more information. Summary Protein is a nutrient that your body needs for growth and development, repairing and making cells and tissues, fighting infection, and providing energy. Protein is in both plant and animal foods. Some of these foods have more protein than others. Depending on your overall health, you may need more or less protein in your diet. Talk to your health care provider about how much protein you need. This information is not intended to replace advice given to you by your health care provider. Make sure you discuss any questions you have with your health care provider. Document Revised: 12/04/2019 Document Reviewed: 12/04/2019 Elsevier Patient Education  2024 [employer].

## 2022-11-25 LAB — COMPREHENSIVE METABOLIC PANEL
ALT: 4 U/L (ref 0–53)
AST: 11 U/L (ref 0–37)
Albumin: 3.9 g/dL (ref 3.5–5.2)
Alkaline Phosphatase: 50 U/L (ref 39–117)
BUN: 35 mg/dL — ABNORMAL HIGH (ref 6–23)
CO2: 29 meq/L (ref 19–32)
Calcium: 9 mg/dL (ref 8.4–10.5)
Chloride: 105 meq/L (ref 96–112)
Creatinine, Ser: 1.43 mg/dL (ref 0.40–1.50)
GFR: 42.91 mL/min — ABNORMAL LOW (ref >60.00)
Glucose, Bld: 109 mg/dL — ABNORMAL HIGH (ref 70–99)
Potassium: 4.5 meq/L (ref 3.5–5.1)
Sodium: 142 meq/L (ref 135–145)
Total Bilirubin: 0.3 mg/dL (ref 0.2–1.2)
Total Protein: 6.3 g/dL (ref 6.0–8.3)

## 2022-11-25 LAB — VITAMIN D 25 HYDROXY (VIT D DEFICIENCY, FRACTURES): VITD: 69.39 ng/mL (ref 30.00–100.00)

## 2022-11-25 LAB — LIPID PANEL
Cholesterol: 113 mg/dL (ref 0–200)
HDL: 47.6 mg/dL (ref >39.00)
LDL Cholesterol: 45 mg/dL (ref 0–99)
NonHDL: 65.1
Total CHOL/HDL Ratio: 2
Triglycerides: 103 mg/dL (ref 0.0–149.0)
VLDL: 20.6 mg/dL (ref 0.0–40.0)

## 2022-11-25 LAB — T4, FREE: Free T4: 0.95 ng/dL (ref 0.60–1.60)

## 2022-11-25 LAB — IRON,TIBC AND FERRITIN PANEL
%SAT: 30 % (ref 20–48)
Ferritin: 35 ng/mL (ref 24–380)
Iron: 60 ug/dL (ref 50–180)
TIBC: 199 ug/dL — ABNORMAL LOW (ref 250–425)

## 2022-11-25 LAB — T3, FREE: T3, Free: 3.3 pg/mL (ref 2.3–4.2)

## 2022-11-25 LAB — TSH: TSH: 2.24 u[IU]/mL (ref 0.35–5.50)

## 2022-11-26 ENCOUNTER — Telehealth (HOSPITAL_BASED_OUTPATIENT_CLINIC_OR_DEPARTMENT_OTHER): Payer: Self-pay

## 2022-11-26 NOTE — Progress Notes (Signed)
Cardiology Clinic Note   Patient Name: [name] Date of Encounter: 11/30/2022  Primary Care Provider:  Bradd Canary, MD Primary Cardiologist:  Dietrich Pates, MD  Patient Profile    [name] 89 year old male presents to the clinic today for follow-up evaluation of his paroxysmal atrial fibrillation, chronic diastolic CHF and preoperative cardiac evaluation.  Past Medical History    Past Medical History:  Diagnosis Date   Acute bronchitis 06/10/2015   Allergic rhinitis    Anemia    Asthma    Carotid stenosis    a. s/p Left CEA 2002;  b. Carotid US 3/16:  patent R CEA, L < 40%   Chronic pain syndrome    Left shoulder, back   Closed fracture of neck of right femur (HCC)    Diverticulosis    Dyslipidemia    Epigastric pain 04/05/2016   Esophageal stricture    Distal, benign   Femur fracture, left (HCC) 10/27/2018   GERD (gastroesophageal reflux disease)    Hemorrhoids    HTN (hypertension)    Negative renal duplex 07-22-11   Hx of echocardiogram    a. Echo 10/11: mod LVH, EF 55-60%   Pleural effusion 07/17/2020   Prostate cancer (HCC) 2000   Seed XRT   S/P thoracentesis    Spondylosis, cervical, with myelopathy 11/26/2015   Stroke (HCC) 08/1999   right thalamic - on chronic Plavix/ASA   Past Surgical History:  Procedure Laterality Date   ANTERIOR APPROACH HEMI HIP ARTHROPLASTY Right 03/15/2021   Procedure: ANTERIOR APPROACH HEMI HIP ARTHROPLASTY;  Surgeon: Samson Frederic, MD;  Location: [facility] OR;  Service: Orthopedics;  Laterality: Right;   APPENDECTOMY  1953   CAROTID ENDARTERECTOMY Right 01/2000   CATARACT EXTRACTION Bilateral    CERVICAL DISC SURGERY  8/07   CHEST TUBE INSERTION Left 11/11/2020   Procedure: INSERTION PLEURAL DRAINAGE CATHETER;  Surgeon: Josephine Igo, DO;  Location: [facility] END[facility]COPY;  Service: Pulmonary;  Laterality: Left;  Indwelling Pleural Catheter   HIP ARTHROPLASTY Left 10/24/2018   Procedure: ARTHROPLASTY BIPOLAR HIP  (HEMIARTHROPLASTY);  Surgeon: Durene Romans, MD;  Location: [facility] OR;  Service: Orthopedics;  Laterality: Left;   INSERTION PROSTATE RADIATION SEED  2000   LUMBAR DISC SURGERY  1990s   NASAL SINUS SURGERY     x 3   REMOVAL OF PLEURAL DRAINAGE CATHETER N/A 12/24/2020   Procedure: MINOR REMOVAL OF PLEURAL DRAINAGE CATHETER;  Surgeon: Josephine Igo, DO;  Location: [facility];  Service: Cardiopulmonary;  Laterality: N/A;   THORACENTESIS N/A 08/15/2020   Procedure: THORACENTESIS;  Surgeon: Josephine Igo, DO;  Location: [facility]COPY;  Service: Pulmonary;  Laterality: N/A;    Allergies  Allergies  Allergen Reactions   Hctz [Hydrochlorothiazide] Shortness Of Breath, Swelling and Other (See Comments)    "Swelling and dyspnea"    Hydralazine Shortness Of Breath and Other (See Comments)    Chest pain and GI issues also   Diovan [Valsartan] Other (See Comments)    Elevated potassium- Hyperkalemia   Metformin And Related Other (See Comments)    Reaction not recalled   Nsaids Other (See Comments)    Other than Tylenol, he isn't to have these because he's on Eliquis   Other Other (See Comments)    Clonidine B/P patch = Caused redness   Prednisone Other (See Comments)    Suicidal thoughts   Spironolactone Swelling    Site of swelling not recalled   Codeine Itching and Nausea Only  Oxycodone-Acetaminophen Itching and Nausea Only    History of Present Illness    RANDOM TOUSANT has a PMH of labile hypertension, cardiovascular disease status post left CEA 2002, hyperlipidemia, GERD, reactive airways disease, esophageal stricture, and paroxysmal atrial fibrillation.  His echocardiogram in 2020 showed an LVEF and right ventricular function that were normal.  He was seen in follow-up by Dr. Tenny Craw 11/21.  He had been seen by pulmonology for pleural effusion.  He underwent thoracentesis 7/22.  Cytology was negative for malignancy.  His effusion recurred.  He diuresed with low-dose furosemide.  He  underwent repeat thoracentesis and 2 L were removed on 8/4.  Again cytology was negative.  He was seen in follow-up by Dr. Tenny Craw on 07/01/2021.  He remained stable from a cardiac standpoint.  He did note some back pain.  He was planning to have back injection.  His blood pressure was well-controlled.  He was noted to be in sinus rhythm and denied palpitations.  Follow-up was planned for 8 months.  He presented to the clinic 05/08/22 for preoperative cardiac evaluation and follow-up evaluation.  He stated he felt well.  He reported compliance with his medication and denied side effects.  We reviewed his upcoming dental extractions and recommendations for Eliquis hold.  He and his wife expressed understanding.  We reviewed his last visit with Dr. Tenny Craw and medications.  He followed with Dr. Tonia Brooms for pulmonology.  He remained stable from a cardiac standpoint . He was able to complete greater than 4 METS of physical activity.  I planned follow-up in 6 to 8 months.  He was seen in follow up by Dr. Tenny Craw on 09/16/22. His 08/15/22 ED visit was reviewed. He had been sent home with 40 mg of lasix x 3. His wife noted that his swelling had gone down and started to come back since medication was stopped. He denied cp, and reported that his breathing was stable.   He presents to the clinic today for follow up evaluation and states he occasionally has a [employer] sandwich.  He has been fairly sedentary.  His wife reports that they had just traveled back from the beach.  He walks in the house there but rarely gets outside exercise.  He is euvolemic today.  We reviewed his last cardiology visit.  He and his wife expressed understanding.  I have asked him to increase the calories in his diet and continue heart healthy low sodium foods.  I will give him chair exercises, continue his current medication regimen and plan follow-up in 6 months.  Today he denies chest pain, shortness of breath, lower extremity edema, fatigue,  palpitations, melena, hematuria, hemoptysis, diaphoresis, weakness, presyncope, syncope, orthopnea, and PND.     Home Medications    Prior to Admission medications   Medication Sig Start Date End Date Taking? Authorizing Provider  acetaminophen (TYLENOL) 500 MG tablet Take 1,000 mg by mouth every 6 (six) hours as needed for mild pain or headache.    [provider]  albuterol (VENTOLIN HFA) 108 (90 Base) MCG/ACT inhaler Inhale 1-2 puffs into the lungs every 6 (six) hours as needed for wheezing or shortness of breath. 04/07/22   Bradd Canary, MD  amLODipine (NORVASC) 5 MG tablet Take 2 tablets (10 mg total) by mouth daily (need office visit) 04/15/22   Pricilla Riffle, MD  amoxicillin (AMOXIL) 500 MG capsule Take 1 capsule (500 mg total) by mouth 3 (three) times daily for 7 days. 04/30/22 05/07/22  apixaban (ELIQUIS) 5 MG TABS tablet Take 1 tablet (5 mg total) by mouth 2 (two) times daily. 05/04/22 05/04/23  Pricilla Riffle, MD  atorvastatin (LIPITOR) 40 MG tablet Take 1 tablet (40 mg total) by mouth daily. 04/13/22   Bradd Canary, MD  Cholecalciferol (VITAMIN D3 PO) Take 1 tablet by mouth in the morning. Unsure of how many units    [provider]  docusate sodium (COLACE) 100 MG capsule Take 300 mg by mouth 2 (two) times daily.    [provider]  doxycycline (VIBRA-TABS) 100 MG tablet Take 1 tablet (100 mg total) by mouth 2 (two) times daily. 04/09/22   Bradd Canary, MD  Ferrous Fumarate (HEMOCYTE - 106 MG FE) 324 (106 Fe) MG TABS tablet Take 1 tablet (106 mg of iron total) by mouth 2 times daily. 08/07/21   Bradd Canary, MD  gabapentin (NEURONTIN) 300 MG capsule TAKE 1 CAPSULE BY MOUTH 3 TIMES DAILY Patient taking differently: Take 300 mg by mouth at bedtime. 12/23/20 12/23/21  Bradd Canary, MD  hyoscyamine (LEVSIN SL) 0.125 MG SL tablet Place 1 tablet under the tongue every 4 hours as needed. 04/07/22   Bradd Canary, MD  lisinopril (ZESTRIL) 5 MG tablet Take 1  tablet (5 mg) by mouth daily. 04/07/22   Bradd Canary, MD  metoCLOPramide (REGLAN) 5 MG tablet Take 1 tablet (5 mg total) by mouth at bedtime. Call for appointment for future refills. 04/15/22   Iva Boop, MD  minoxidil (LONITEN) 10 MG tablet Take 1 tablet (10 mg) by mouth 2 times daily. Please call Dr. Tenny Craw for yearly appointment for June 2024 [phone] 04/30/22   Pricilla Riffle, MD  ondansetron [facility] ODT) 8 MG disintegrating tablet Dissolve 1 tablet by mouth every 8 hours as needed for nausea or vomiting. 08/23/20   Bradd Canary, MD  pantoprazole (PROTONIX) 40 MG tablet TAKE 1 TABLET BY MOUTH ONCE A DAY BEFORE BREAKFAST 03/18/20 07/01/21  Iva Boop, MD  polyvinyl alcohol (LIQUIFILM TEARS) 1.4 % ophthalmic solution Place 1 drop into both eyes 5 (five) times daily as needed for dry eyes.    [provider]  sennosides-docusate sodium (SENOKOT-S) 8.6-50 MG tablet Take 1-2 tablets by mouth See admin instructions. Take 1 tablet in the morning & take 2 tablet at nigh    [provider]  traMADol (ULTRAM) 50 MG tablet Take 1 tablet (50 mg total) by mouth 3 (three) times daily as needed. 04/13/22 10/10/22  Bradd Canary, MD  triamcinolone cream (KENALOG) 0.1 % Apply topically to affected areas of the skin daily as needed. 10/16/21     triamcinolone cream (KENALOG) 0.5 % Apply 1 application topically daily as needed (for skin irritation).  01/22/12   Etta Grandchild, MD  Vitamin D, Ergocalciferol, (DRISDOL) 1.25 MG (50000 UNIT) CAPS capsule Take 1 capsule (50,000 Units total) by mouth every 7 (seven) days. 04/09/22   Bradd Canary, MD    Family History    Family History  Problem Relation Age of Onset   Stroke Brother    Stroke Sister    Stroke Mother    Hyperlipidemia Mother    Hypertension Mother    Lung disease Father    Diabetes Son    Gout Son    Obesity Sister    Alcohol abuse Brother    Cancer Brother        lung, smoker   He indicated that his mother is  deceased. He indicated that his father is deceased. He indicated that only one of his two sisters is alive. He indicated that all of his three brothers are deceased. He indicated that his maternal grandmother is deceased. He indicated that his maternal grandfather is deceased. He indicated that his paternal grandmother is deceased. He indicated that his paternal grandfather is deceased. He indicated that his son is alive.  Social History    Social History   Socioeconomic History   Marital status: Married    Spouse name: Not on file   Number of children: 1   Years of education: 10   Highest education level: Not on file  Occupational History   Occupation: Retired  Tobacco Use   Smoking status: Former    Current packs/day: 0.00    Average packs/day: 0.3 packs/day for 35.0 years (8.8 ttl pk-yrs)    Types: Cigarettes    Start date: 89    Quit date: 01/12/1978    Years since quitting: 44.9   Smokeless tobacco: Current    Types: Chew   Tobacco comments:    Started smoking at age 62  Vaping Use   Vaping status: Never Used  Substance and Sexual Activity   Alcohol use: No    Comment: Prior alcoholic, sober since 2002   Drug use: No   Sexual activity: Not Currently  Other Topics Concern   Not on file  Social History Narrative   Retired lives at home w/ his wife   1 daughter   Right-handed    Caffeine: drinks a lot of coffee    Former smoker, former alcoholic abstinent for many years   Social Determinants of Corporate investment banker Strain: Low Risk  (05/15/2021)   Overall Financial Resource Strain (CARDIA)    Difficulty of Paying Living Expenses: Not hard at all  Food Insecurity: No Food Insecurity (05/22/2022)   Hunger Vital Sign    Worried About Running Out of Food in the Last Year: Never true    Ran Out of Food in the Last Year: Never true  Transportation Needs: No Transportation Needs (05/22/2022)   PRAPARE - Administrator, Civil Service (Medical): No    Lack  of Transportation (Non-Medical): No  Physical Activity: Insufficiently Active (05/15/2021)   Exercise Vital Sign    Days of Exercise per Week: 1 day    Minutes of Exercise per Session: 50 min  Stress: No Stress Concern Present (05/15/2021)   [employer] of Occupational Health - Occupational Stress Questionnaire    Feeling of Stress : Not at all  Social Connections: Socially Integrated (05/15/2021)   Social Connection and Isolation Panel [NHANES]    Frequency of Communication with Friends and Family: More than three times a week    Frequency of Social Gatherings with Friends and Family: More than three times a week    Attends Religious Services: More than 4 times per year    Active Member of [employer] or Organizations: Yes    Attends Engineer, structural: More than 4 times per year    Marital Status: Married  Catering manager Violence: Not At Risk (05/15/2021)   Humiliation, Afraid, Rape, and Kick questionnaire    Fear of Current or Ex-Partner: No    Emotionally Abused: No    Physically Abused: No    Sexually Abused: No     Review of Systems    General:  No chills, fever, night sweats or weight changes.  Cardiovascular:  No chest  pain, dyspnea on exertion, edema, orthopnea, palpitations, paroxysmal nocturnal dyspnea. Dermatological: No rash, lesions/masses Respiratory: No cough, dyspnea Urologic: No hematuria, dysuria Abdominal:   No nausea, vomiting, diarrhea, bright red blood per rectum, melena, or hematemesis Neurologic:  No visual changes, wkns, changes in mental status. All other systems reviewed and are otherwise negative except as noted above.  Physical Exam    VS:  BP 100/60 (BP Location: Right Arm, Patient Position: Sitting, Cuff Size: Normal)   Pulse 74   Ht 5\' 11"  (1.803 m)   Wt 137 lb 12.8 oz (62.5 kg)   SpO2 95%   BMI 19.22 kg/m  , BMI Body mass index is 19.22 kg/m. GEN: Well nourished, well developed, in no acute distress. HEENT: normal. Neck: Supple, no  JVD, carotid bruits, or masses. Cardiac: RRR, no murmurs, rubs, or gallops. No clubbing, cyanosis, edema.  Radials/DP/PT 2+ and equal bilaterally.  Respiratory:  Respirations regular and unlabored, clear to auscultation bilaterally. GI: Soft, nontender, nondistended, BS + x 4. MS: no deformity or atrophy. Skin: warm and dry, no rash. Neuro:  Strength and sensation are intact. Psych: Normal affect.  Accessory Clinical Findings    Recent Labs: 08/20/2022: Brain Natriuretic Peptide 281; Magnesium 1.9 10/13/2022: NT-Pro BNP 943 11/23/2022: Hemoglobin 12.9; Platelets 305.0 11/24/2022: ALT 4; BUN 35; Creatinine, Ser 1.43; Potassium 4.5; Sodium 142; TSH 2.24   Recent Lipid Panel    Component Value Date/Time   CHOL 113 11/24/2022 1630   TRIG 103.0 11/24/2022 1630   HDL 47.60 11/24/2022 1630   CHOLHDL 2 11/24/2022 1630   VLDL 20.6 11/24/2022 1630   LDLCALC 45 11/24/2022 1630         ECG personally reviewed by me today-none today.  EKG 05/08/22 normal sinus rhythm left axis deviation anterior septal infarct undetermined age 55 bpm- No acute changes  Echocardiogram 08/29/2020  IMPRESSIONS     1. Left ventricular ejection fraction, by estimation, is 65 to 70%. The  left ventricle has normal function. The left ventricle has no regional  wall motion abnormalities. Left ventricular diastolic parameters are  consistent with Grade I diastolic  dysfunction (impaired relaxation).   2. Right ventricular systolic function is normal. The right ventricular  size is normal. There is mildly elevated pulmonary artery systolic  pressure. The estimated right ventricular systolic pressure is 43.0 mmHg.   3. Right atrial size was mildly dilated.   4. The mitral valve is grossly normal. No evidence of mitral valve  regurgitation. No evidence of mitral stenosis.   5. The aortic valve is tricuspid. Aortic valve regurgitation is not  visualized. No aortic stenosis is present.   6. The inferior vena  cava is dilated in size with >50% respiratory  variability, suggesting right atrial pressure of 8 mmHg.   Comparison(s): Changes from prior study are noted. EF unchanged. G1DD.  Elevated RVSP~43 mmHG.   FINDINGS   Left Ventricle: Left ventricular ejection fraction, by estimation, is 65  to 70%. The left ventricle has normal function. The left ventricle has no  regional wall motion abnormalities. The left ventricular internal cavity  size was normal in size. There is   no left ventricular hypertrophy. Left ventricular diastolic parameters  are consistent with Grade I diastolic dysfunction (impaired relaxation).   Right Ventricle: The right ventricular size is normal. No increase in  right ventricular wall thickness. Right ventricular systolic function is  normal. There is mildly elevated pulmonary artery systolic pressure. The  tricuspid regurgitant velocity is 2.96   m/s,  and with an assumed right atrial pressure of 8 mmHg, the estimated  right ventricular systolic pressure is 43.0 mmHg.   Left Atrium: Left atrial size was normal in size.   Right Atrium: Right atrial size was mildly dilated.   Pericardium: Trivial pericardial effusion is present. Presence of  pericardial fat pad.   Mitral Valve: The mitral valve is grossly normal. No evidence of mitral  valve stenosis.   Tricuspid Valve: The tricuspid valve is grossly normal. Tricuspid valve  regurgitation is trivial. No evidence of tricuspid stenosis.   Aortic Valve: The aortic valve is tricuspid. Aortic valve regurgitation is  not visualized. No aortic stenosis is present.   Pulmonic Valve: The pulmonic valve was grossly normal. Pulmonic valve  regurgitation is not visualized. No evidence of pulmonic stenosis.   Aorta: The aortic root and ascending aorta are structurally normal, with  no evidence of dilitation.   Venous: The right lower pulmonary vein is normal. The inferior vena cava  is dilated in size with greater  than 50% respiratory variability,  suggesting right atrial pressure of 8 mmHg.    Assessment & Plan   1.  HFpEF- Weight stable.  Euvolemic.  Echocardiogram 08/29/2020 showed LVEF of 65-70%, G1 DD, mildly dilated right atria. Heart healthy low-sodium diet Daily weights Continue current medical therapy Elevate lower extremities when not active  Paroxysmal atrial fibrillation- 74 BPM.  Rate controlled.  Compliance with apixaban and denies bleeding issues. Avoid triggers- reviewed Continue apixaban  Essential hypertension-BP today 100/60. Maintain blood pressure log Continue current medical therapy Low-sodium diet  Hyperlipidemia- 11/24/2022: Cholesterol 113; HDL 47.60; LDL Cholesterol 45; Triglycerides 103.0; VLDL 20.6. CHA2DS2-VASc Score = 8 This indicates a 10.8% annual risk of stroke. The patient's score is based upon: CHF, HTN, Diabetes ,CVA,vascular,Age High-fiber diet Continue atorvastatin Increase physical activity as tolerated  Carotid artery disease-denies episodes of lightheadedness, presyncope or syncope.  Underwent left CEA 2002.  Carotid Dopplers 12/11/2019 showed no significant carotid stenosis with less than 50% bilateral ICA. Continue atorvastatin  History of pleural effusion-no increased work of breathing or DOE.  Has had recurrent thoracentesis.  Cytology has been negative. Follows with PCP/pulmonology    Disposition: Follow-up with Dr. Tenny Craw or APP in 6 months.   Thomasene Ripple. Delia Slatten NP-C     11/30/2022, 3:11 PM [facility] [address] Office [phone] Fax [phone]    I spent 13 minutes examining this patient, reviewing medications, and using patient centered shared decision making involving her cardiac care.  Prior to her visit I spent greater than 20 minutes reviewing her past medical history,  medications, and prior cardiac tests.

## 2022-11-27 ENCOUNTER — Encounter: Payer: Self-pay | Admitting: Emergency Medicine

## 2022-11-30 ENCOUNTER — Ambulatory Visit: Payer: Medicare HMO | Attending: General Practice | Admitting: General Practice

## 2022-11-30 ENCOUNTER — Other Ambulatory Visit: Payer: Self-pay | Admitting: Family Medicine

## 2022-11-30 ENCOUNTER — Encounter: Payer: Self-pay | Admitting: General Practice

## 2022-11-30 ENCOUNTER — Other Ambulatory Visit (HOSPITAL_COMMUNITY): Payer: Self-pay

## 2022-11-30 VITALS — BP 100/60 | HR 74 | Ht 71.0 in | Wt 137.8 lb

## 2022-11-30 DIAGNOSIS — I10 Essential (primary) hypertension: Secondary | ICD-10-CM | POA: Diagnosis not present

## 2022-11-30 DIAGNOSIS — I5032 Chronic diastolic (congestive) heart failure: Secondary | ICD-10-CM

## 2022-11-30 DIAGNOSIS — Z8709 Personal history of other diseases of the respiratory system: Secondary | ICD-10-CM | POA: Diagnosis not present

## 2022-11-30 DIAGNOSIS — E782 Mixed hyperlipidemia: Secondary | ICD-10-CM | POA: Diagnosis not present

## 2022-11-30 DIAGNOSIS — I6523 Occlusion and stenosis of bilateral carotid arteries: Secondary | ICD-10-CM

## 2022-11-30 DIAGNOSIS — I480 Paroxysmal atrial fibrillation: Secondary | ICD-10-CM | POA: Diagnosis not present

## 2022-11-30 MED ORDER — TRAMADOL HCL 50 MG PO TABS
50.0000 mg | ORAL_TABLET | Freq: Three times a day (TID) | ORAL | 0 refills | Status: DC | PRN
  Filled 2022-11-30: qty 90, 30d supply, fill #0

## 2022-11-30 NOTE — Patient Instructions (Signed)
Medication Instructions:  No changes *If you need a refill on your cardiac medications before your next appointment, please call your pharmacy*   Lab Work: None    Testing/Procedures: None    Follow-Up: At [facility], you and your health needs are our priority.  As part of our continuing mission to provide you with exceptional heart care, we have created designated Provider Care Teams.  These Care Teams include your primary Cardiologist (physician) and Advanced Practice Providers (APPs -  Physician Assistants and Nurse Practitioners) who all work together to provide you with the care you need, when you need it.   Your next appointment:   6 month(s)  Provider:   Edd Fabian, FNP        Other Instructions Increase hydration Increase the amount of calories in diet Encouraged to use support stockings Chair exercises

## 2022-11-30 NOTE — Telephone Encounter (Signed)
Requesting: Tramadol 50mg  Contract: 04/07/2022 UDS: N/A Last Visit: 11/24/2022 Next Visit: 03/08/2022 Last Refill: 11/02/2022  Please Advise

## 2022-12-01 ENCOUNTER — Other Ambulatory Visit (HOSPITAL_COMMUNITY): Payer: Self-pay

## 2022-12-01 ENCOUNTER — Other Ambulatory Visit: Payer: Self-pay | Admitting: Family Medicine

## 2022-12-01 ENCOUNTER — Telehealth: Payer: Self-pay | Admitting: Family Medicine

## 2022-12-01 ENCOUNTER — Other Ambulatory Visit: Payer: Self-pay

## 2022-12-01 MED ORDER — OMEPRAZOLE 40 MG PO CPDR
40.0000 mg | DELAYED_RELEASE_CAPSULE | Freq: Every day | ORAL | 5 refills | Status: DC | PRN
  Filled 2022-12-01 – 2022-12-09 (×2): qty 30, 30d supply, fill #0
  Filled 2023-07-26: qty 30, 30d supply, fill #1
  Filled 2023-10-18: qty 30, 30d supply, fill #2
  Filled 2023-11-03: qty 30, 30d supply, fill #3

## 2022-12-01 NOTE — Telephone Encounter (Signed)
Patients wife called to request lab results to be mailed to their home.   She called for results of xray. Advised results not back on xray.    She also said that a prescription for prilosec was supposed to be sent to [facility] outpatient pharmacy. Please send medication.

## 2022-12-01 NOTE — Telephone Encounter (Signed)
Called and spoke with [name] and she was informed her lab results had already been mailed out. We are still waiting for the radiology results. She would also like to know if Dr. Abner Greenspan could write a prescription for Prilosec for her husband. It hasn't been refilled since 2019

## 2022-12-02 ENCOUNTER — Other Ambulatory Visit (HOSPITAL_COMMUNITY): Payer: Self-pay

## 2022-12-02 NOTE — Telephone Encounter (Signed)
Called and spoke with patients wife and made aware prescription has been sent. Also called reading room about status of xray results

## 2022-12-09 ENCOUNTER — Other Ambulatory Visit (HOSPITAL_COMMUNITY): Payer: Self-pay

## 2022-12-09 ENCOUNTER — Other Ambulatory Visit: Payer: Self-pay

## 2022-12-15 ENCOUNTER — Other Ambulatory Visit (HOSPITAL_COMMUNITY): Payer: Self-pay

## 2022-12-15 ENCOUNTER — Telehealth (HOSPITAL_BASED_OUTPATIENT_CLINIC_OR_DEPARTMENT_OTHER): Payer: Self-pay

## 2022-12-18 ENCOUNTER — Other Ambulatory Visit (HOSPITAL_COMMUNITY): Payer: Self-pay

## 2022-12-25 ENCOUNTER — Telehealth (HOSPITAL_BASED_OUTPATIENT_CLINIC_OR_DEPARTMENT_OTHER): Payer: Self-pay | Admitting: Family Medicine

## 2022-12-30 ENCOUNTER — Other Ambulatory Visit: Payer: Self-pay | Admitting: Family Medicine

## 2022-12-30 ENCOUNTER — Other Ambulatory Visit (HOSPITAL_COMMUNITY): Payer: Self-pay

## 2022-12-30 DIAGNOSIS — H02102 Unspecified ectropion of right lower eyelid: Secondary | ICD-10-CM | POA: Diagnosis not present

## 2022-12-30 LAB — HM DIABETES EYE EXAM

## 2022-12-30 MED ORDER — TRAMADOL HCL 50 MG PO TABS
50.0000 mg | ORAL_TABLET | Freq: Three times a day (TID) | ORAL | 0 refills | Status: DC | PRN
  Filled 2022-12-30: qty 90, 30d supply, fill #0

## 2022-12-30 NOTE — Telephone Encounter (Signed)
Requesting: tramadol 50mg   Contract: 04/07/22 UDS: 07/16/20 Last Visit: 11/24/22 Next Visit: 03/08/23 Last Refill: 11/30/22 #90 and 0RF   Please Advise

## 2022-12-31 ENCOUNTER — Other Ambulatory Visit (HOSPITAL_COMMUNITY): Payer: Self-pay

## 2022-12-31 ENCOUNTER — Other Ambulatory Visit: Payer: Self-pay

## 2023-01-11 ENCOUNTER — Other Ambulatory Visit (HOSPITAL_COMMUNITY): Payer: Self-pay

## 2023-01-11 ENCOUNTER — Ambulatory Visit (HOSPITAL_BASED_OUTPATIENT_CLINIC_OR_DEPARTMENT_OTHER)
Admission: RE | Admit: 2023-01-11 | Discharge: 2023-01-11 | Disposition: A | Discharge status: Home / Self Care | Payer: Medicare HMO | Source: Ambulatory Visit | Attending: Family Medicine | Admitting: Family Medicine

## 2023-01-11 ENCOUNTER — Other Ambulatory Visit: Payer: Self-pay | Admitting: Family Medicine

## 2023-01-11 DIAGNOSIS — M4802 Spinal stenosis, cervical region: Secondary | ICD-10-CM | POA: Diagnosis not present

## 2023-01-11 DIAGNOSIS — M542 Cervicalgia: Secondary | ICD-10-CM | POA: Diagnosis not present

## 2023-01-11 DIAGNOSIS — R519 Headache, unspecified: Secondary | ICD-10-CM | POA: Insufficient documentation

## 2023-01-11 DIAGNOSIS — E559 Vitamin D deficiency, unspecified: Secondary | ICD-10-CM

## 2023-01-11 DIAGNOSIS — I6350 Cerebral infarction due to unspecified occlusion or stenosis of unspecified cerebral artery: Secondary | ICD-10-CM

## 2023-01-11 DIAGNOSIS — R531 Weakness: Secondary | ICD-10-CM

## 2023-01-11 DIAGNOSIS — I639 Cerebral infarction, unspecified: Secondary | ICD-10-CM | POA: Diagnosis not present

## 2023-01-11 DIAGNOSIS — E782 Mixed hyperlipidemia: Secondary | ICD-10-CM

## 2023-01-11 DIAGNOSIS — I10 Essential (primary) hypertension: Secondary | ICD-10-CM

## 2023-01-11 DIAGNOSIS — M5412 Radiculopathy, cervical region: Secondary | ICD-10-CM | POA: Diagnosis not present

## 2023-01-12 ENCOUNTER — Other Ambulatory Visit: Payer: Self-pay

## 2023-01-12 ENCOUNTER — Other Ambulatory Visit (HOSPITAL_COMMUNITY): Payer: Self-pay

## 2023-01-12 MED ORDER — ATORVASTATIN CALCIUM 40 MG PO TABS
40.0000 mg | ORAL_TABLET | Freq: Every day | ORAL | 0 refills | Status: DC
  Filled 2023-01-12: qty 90, 90d supply, fill #0

## 2023-01-14 ENCOUNTER — Encounter: Payer: Self-pay | Admitting: Emergency Medicine

## 2023-01-19 ENCOUNTER — Other Ambulatory Visit (HOSPITAL_COMMUNITY): Payer: Self-pay

## 2023-01-21 ENCOUNTER — Other Ambulatory Visit (HOSPITAL_COMMUNITY): Payer: Self-pay

## 2023-01-25 ENCOUNTER — Ambulatory Visit: Payer: Self-pay | Admitting: Family Medicine

## 2023-01-25 ENCOUNTER — Other Ambulatory Visit: Payer: Self-pay

## 2023-01-25 ENCOUNTER — Other Ambulatory Visit (HOSPITAL_BASED_OUTPATIENT_CLINIC_OR_DEPARTMENT_OTHER): Payer: Self-pay

## 2023-01-25 ENCOUNTER — Other Ambulatory Visit: Payer: Self-pay | Admitting: Family Medicine

## 2023-01-25 ENCOUNTER — Other Ambulatory Visit: Payer: Self-pay | Admitting: Emergency Medicine

## 2023-01-25 ENCOUNTER — Other Ambulatory Visit (INDEPENDENT_AMBULATORY_CARE_PROVIDER_SITE_OTHER): Payer: Medicare HMO

## 2023-01-25 ENCOUNTER — Telehealth: Payer: Self-pay | Admitting: Family Medicine

## 2023-01-25 DIAGNOSIS — R300 Dysuria: Secondary | ICD-10-CM

## 2023-01-25 MED ORDER — CEPHALEXIN 500 MG PO CAPS: 500.0000 mg | ORAL_CAPSULE | Freq: Two times a day (BID) | ORAL | 0 refills | Status: DC

## 2023-01-25 MED ORDER — CEPHALEXIN 500 MG PO CAPS
500.0000 mg | ORAL_CAPSULE | Freq: Two times a day (BID) | ORAL | 0 refills | Status: DC
  Filled 2023-01-25 (×2): qty 14, 7d supply, fill #0

## 2023-01-25 NOTE — Telephone Encounter (Signed)
 1st attempt, unable to reach pt, no VM set up to leave message.  Copied from CRM [id number]. Topic: Clinical - Pink Word Triage >> Jan 25, 2023 11:04 AM Carmell SAUNDERS wrote: Reason for Triage: Elijah stated pt has a bad UTI and needs to give a specimen to get an antibiotic. The pt is in bed currently and not with her to speak with a nurse, but he is experiencing pain and burning during urination. Would like a callback [phone]

## 2023-01-25 NOTE — Telephone Encounter (Signed)
 Patient and his wife were in our lobby around 5 PM this evening asking for antibiotics for possible urinary tract infection.  My partner Dr Domenica I believe had a standing order for urinalysis and culture which both have been collected  As such, I went ahead and called in a prescription for Keflex  for him to start while culture is pending

## 2023-01-25 NOTE — Telephone Encounter (Signed)
 Called and spoke with patient's wife. A lab appointment was made for [name] to bring in urine sample.

## 2023-01-25 NOTE — Telephone Encounter (Signed)
   Chief Complaint: pain during urination Symptoms: burns/ frequency increased Frequency- every time pt goes to restroom Disposition: [x] ED /[] Urgent Care (no appt availability in office) / [] Appointment(In office/virtual)/ []  [facility]/ [] Home Care/ [] Refused Recommended Disposition /[] [facility]/ []  Follow-up with PCP Additional Notes: Pt wife called saying pt has UTI and needs to take a specimen order to confirm it. Wife did all the talking. Wife is very upset over this triage process. Due to assessment answers, the protocol suggested pt go to ED. Wife refused and will not take him. Wife is asking to speak print production planner. CAL called but no one answered. Could you please call pt back to address the needs?          Copied from CRM [id number]. Topic: Clinical - Red Word Triage >> Jan 25, 2023  1:38 PM Nestora J wrote: Red Word that prompted transfer to Nurse Triage: Pain when urinating Reason for Disposition  Can't urinate or difficulty passing urine  Answer Assessment - Initial Assessment Questions 1. SYMPTOM: What's the main symptom you're concerned about? (e.g., frequency, incontinence)     Burning, hurts, goes every few hours 2. ONSET: When did the   start?     Thursday or Friday last week 3. PAIN: Is there any pain? If Yes, ask: How bad is it? (Scale: 1-10; mild, moderate, severe)     Wife- Wont answer 4. CAUSE: What do you think is causing the symptoms?     Not sure  5. OTHER SYMPTOMS: Do you have any other symptoms? (e.g., blood in urine, fever, flank pain, pain with urination)     Hurts  Protocols used: Urinary Symptoms-A-AH, Genital Injury - Male-A-AH

## 2023-01-26 LAB — URINALYSIS, ROUTINE W REFLEX MICROSCOPIC
Bilirubin Urine: NEGATIVE
Ketones, ur: NEGATIVE
Nitrite: POSITIVE — AB
Specific Gravity, Urine: 1.025 (ref 1.000–1.030)
Total Protein, Urine: 100 — AB
Urine Glucose: NEGATIVE
Urobilinogen, UA: 0.2 (ref 0.0–1.0)
pH: 5.5 (ref 5.0–8.0)

## 2023-01-26 NOTE — Telephone Encounter (Signed)
 Called to follow up with patient to see if pharmacy has contacted them about the medication prescribed for [name]

## 2023-01-28 ENCOUNTER — Other Ambulatory Visit (HOSPITAL_COMMUNITY): Payer: Self-pay

## 2023-01-28 ENCOUNTER — Other Ambulatory Visit: Payer: Self-pay | Admitting: Family Medicine

## 2023-01-28 LAB — URINE CULTURE
MICRO NUMBER:: 15946931
SPECIMEN QUALITY:: ADEQUATE

## 2023-01-28 NOTE — Telephone Encounter (Signed)
Requesting: tramadol 50mg   Contract: 04/07/22 UDS: 07/16/20 Last Visit: 11/24/22 Next Visit: 03/08/23 Last Refill: 12/30/22 #90 and 0RF   Please Advise

## 2023-01-29 ENCOUNTER — Other Ambulatory Visit: Payer: Self-pay

## 2023-01-29 ENCOUNTER — Other Ambulatory Visit (HOSPITAL_COMMUNITY): Payer: Self-pay

## 2023-01-29 MED ORDER — TRAMADOL HCL 50 MG PO TABS
50.0000 mg | ORAL_TABLET | Freq: Three times a day (TID) | ORAL | 0 refills | Status: DC | PRN
  Filled 2023-01-29: qty 90, 30d supply, fill #0

## 2023-02-18 ENCOUNTER — Other Ambulatory Visit (HOSPITAL_COMMUNITY): Payer: Self-pay

## 2023-02-25 ENCOUNTER — Other Ambulatory Visit: Payer: Self-pay | Admitting: Family

## 2023-02-25 ENCOUNTER — Other Ambulatory Visit (HOSPITAL_COMMUNITY): Payer: Self-pay

## 2023-02-26 ENCOUNTER — Other Ambulatory Visit (HOSPITAL_COMMUNITY): Payer: Self-pay

## 2023-02-27 ENCOUNTER — Other Ambulatory Visit (HOSPITAL_COMMUNITY): Payer: Self-pay

## 2023-03-01 ENCOUNTER — Other Ambulatory Visit: Payer: Self-pay | Admitting: Family

## 2023-03-01 ENCOUNTER — Other Ambulatory Visit (HOSPITAL_COMMUNITY): Payer: Self-pay

## 2023-03-01 MED ORDER — TRAMADOL HCL 50 MG PO TABS
50.0000 mg | ORAL_TABLET | Freq: Three times a day (TID) | ORAL | 0 refills | Status: DC | PRN
  Filled 2023-03-01: qty 90, 30d supply, fill #0

## 2023-03-01 NOTE — Telephone Encounter (Signed)
Last Fill: 01/29/23 90 tabs/0 refills  Last OV: 11/24/22 Next OV: 03/08/23  Routing to provider for review/authorization.

## 2023-03-01 NOTE — Telephone Encounter (Signed)
Requesting: tramadol 50mg   Contract: 04/07/22 UDS: 07/16/20 Last Visit: 11/24/22 Next Visit: 03/08/23 Last Refill: 01/29/23 #90 and 0RF   Please Advise

## 2023-03-01 NOTE — Telephone Encounter (Signed)
Copied from CRM [id number]. Topic: Clinical - Medication Refill >> Mar 01, 2023  4:22 PM [place] J wrote: Most Recent Primary Care Visit:  Provider: [facility]  Department: [facility]  Visit Type: LAB VISIT  Date: 01/25/2023  Medication: traMADol HCl 50 MG (Take 1 tablet (50 mg) by mouth 3 times daily as needed.)  Has the patient contacted their pharmacy? Yes (Agent: If no, request that the patient contact the pharmacy for the refill. If patient does not wish to contact the pharmacy document the reason why and proceed with request.) (Agent: If yes, when and what did the pharmacy advise?)  Is this the correct pharmacy for this prescription? Yes If no, delete pharmacy and type the correct one.  This is the patient's preferred pharmacy:  [facility] [address] [place] [address] Phone: [phone] Fax: [phone]   Has the prescription been filled recently? No  Is the patient out of the medication? Yes  Has the patient been seen for an appointment in the last year OR does the patient have an upcoming appointment? Yes  Can we respond through MyChart? Yes  Agent: Please be advised that Rx refills may take up to 3 business days. We ask that you follow-up with your pharmacy.

## 2023-03-02 ENCOUNTER — Other Ambulatory Visit: Payer: Self-pay

## 2023-03-02 ENCOUNTER — Other Ambulatory Visit (HOSPITAL_COMMUNITY): Payer: Self-pay

## 2023-03-07 NOTE — Assessment & Plan Note (Signed)
 Encourage heart healthy diet such as MIND or DASH diet, increase exercise, avoid trans fats, simple carbohydrates and processed foods, consider a krill or fish or flaxseed oil cap daily.

## 2023-03-07 NOTE — Assessment & Plan Note (Signed)
 Well controlled, no changes to meds. Encouraged heart healthy diet such as the DASH diet and exercise as tolerated.

## 2023-03-07 NOTE — Assessment & Plan Note (Signed)
 Supplement and monitor

## 2023-03-07 NOTE — Assessment & Plan Note (Signed)
Asymptomatic, tolerating meds. 

## 2023-03-07 NOTE — Assessment & Plan Note (Signed)
 Hydrate and monitor

## 2023-03-07 NOTE — Assessment & Plan Note (Signed)
 No recent exacerbation no changes

## 2023-03-08 ENCOUNTER — Other Ambulatory Visit (HOSPITAL_BASED_OUTPATIENT_CLINIC_OR_DEPARTMENT_OTHER): Payer: Self-pay

## 2023-03-08 ENCOUNTER — Other Ambulatory Visit (HOSPITAL_COMMUNITY): Payer: Self-pay

## 2023-03-08 ENCOUNTER — Ambulatory Visit (INDEPENDENT_AMBULATORY_CARE_PROVIDER_SITE_OTHER): Payer: Medicare HMO | Admitting: Family Medicine

## 2023-03-08 VITALS — BP 150/72 | HR 95 | Temp 98.2°F | Resp 18 | Ht 71.0 in | Wt 147.2 lb

## 2023-03-08 DIAGNOSIS — M79605 Pain in left leg: Secondary | ICD-10-CM

## 2023-03-08 DIAGNOSIS — E559 Vitamin D deficiency, unspecified: Secondary | ICD-10-CM | POA: Diagnosis not present

## 2023-03-08 DIAGNOSIS — E782 Mixed hyperlipidemia: Secondary | ICD-10-CM | POA: Diagnosis not present

## 2023-03-08 DIAGNOSIS — I480 Paroxysmal atrial fibrillation: Secondary | ICD-10-CM

## 2023-03-08 DIAGNOSIS — I10 Essential (primary) hypertension: Secondary | ICD-10-CM

## 2023-03-08 DIAGNOSIS — I5032 Chronic diastolic (congestive) heart failure: Secondary | ICD-10-CM | POA: Diagnosis not present

## 2023-03-08 DIAGNOSIS — N189 Chronic kidney disease, unspecified: Secondary | ICD-10-CM

## 2023-03-08 DIAGNOSIS — Z79899 Other long term (current) drug therapy: Secondary | ICD-10-CM

## 2023-03-08 DIAGNOSIS — G8929 Other chronic pain: Secondary | ICD-10-CM

## 2023-03-08 DIAGNOSIS — R739 Hyperglycemia, unspecified: Secondary | ICD-10-CM | POA: Diagnosis not present

## 2023-03-08 MED ORDER — DOXYCYCLINE HYCLATE 100 MG PO TABS
100.0000 mg | ORAL_TABLET | Freq: Two times a day (BID) | ORAL | 0 refills | Status: AC
Start: 2023-03-08 — End: ?
  Filled 2023-03-08: qty 20, 10d supply, fill #0

## 2023-03-08 MED ORDER — COVID-19 MRNA VAC-TRIS(PFIZER) 30 MCG/0.3ML IM SUSY
0.3000 mL | PREFILLED_SYRINGE | Freq: Once | INTRAMUSCULAR | 0 refills | Status: AC
  Filled 2023-03-08: qty 0.3, 1d supply, fill #0

## 2023-03-08 NOTE — Patient Instructions (Signed)
 Hypertension, Adult High blood pressure (hypertension) is when the force of blood pumping through the arteries is too strong. The arteries are the blood vessels that carry blood from the heart throughout the body. Hypertension forces the heart to work harder to pump blood and may cause arteries to become narrow or stiff. Untreated or uncontrolled hypertension can lead to a heart attack, heart failure, a stroke, kidney disease, and other problems. A blood pressure reading consists of a higher number over a lower number. Ideally, your blood pressure should be below 120/80. The first ("top") number is called the systolic pressure. It is a measure of the pressure in your arteries as your heart beats. The second ("bottom") number is called the diastolic pressure. It is a measure of the pressure in your arteries as the heart relaxes. What are the causes? The exact cause of this condition is not known. There are some conditions that result in high blood pressure. What increases the risk? Certain factors may make you more likely to develop high blood pressure. Some of these risk factors are under your control, including: Smoking. Not getting enough exercise or physical activity. Being overweight. Having too much fat, sugar, calories, or salt (sodium) in your diet. Drinking too much alcohol. Other risk factors include: Having a personal history of heart disease, diabetes, high cholesterol, or kidney disease. Stress. Having a family history of high blood pressure and high cholesterol. Having obstructive sleep apnea. Age. The risk increases with age. What are the signs or symptoms? High blood pressure may not cause symptoms. Very high blood pressure (hypertensive crisis) may cause: Headache. Fast or irregular heartbeats (palpitations). Shortness of breath. Nosebleed. Nausea and vomiting. Vision changes. Severe chest pain, dizziness, and seizures. How is this diagnosed? This condition is diagnosed by  measuring your blood pressure while you are seated, with your arm resting on a flat surface, your legs uncrossed, and your feet flat on the floor. The cuff of the blood pressure monitor will be placed directly against the skin of your upper arm at the level of your heart. Blood pressure should be measured at least twice using the same arm. Certain conditions can cause a difference in blood pressure between your right and left arms. If you have a high blood pressure reading during one visit or you have normal blood pressure with other risk factors, you may be asked to: Return on a different day to have your blood pressure checked again. Monitor your blood pressure at home for 1 week or longer. If you are diagnosed with hypertension, you may have other blood or imaging tests to help your health care provider understand your overall risk for other conditions. How is this treated? This condition is treated by making healthy lifestyle changes, such as eating healthy foods, exercising more, and reducing your alcohol intake. You may be referred for counseling on a healthy diet and physical activity. Your health care provider may prescribe medicine if lifestyle changes are not enough to get your blood pressure under control and if: Your systolic blood pressure is above 130. Your diastolic blood pressure is above 80. Your personal target blood pressure may vary depending on your medical conditions, your age, and other factors. Follow these instructions at home: Eating and drinking  Eat a diet that is high in fiber and potassium, and low in sodium, added sugar, and fat. An example of this eating plan is called the DASH diet. DASH stands for Dietary Approaches to Stop Hypertension. To eat this way: Eat  plenty of fresh fruits and vegetables. Try to fill one half of your plate at each meal with fruits and vegetables. Eat whole grains, such as whole-wheat pasta, brown rice, or whole-grain bread. Fill about one  fourth of your plate with whole grains. Eat or drink low-fat dairy products, such as skim milk or low-fat yogurt. Avoid fatty cuts of meat, processed or cured meats, and poultry with skin. Fill about one fourth of your plate with lean proteins, such as fish, chicken without skin, beans, eggs, or tofu. Avoid pre-made and processed foods. These tend to be higher in sodium, added sugar, and fat. Reduce your daily sodium intake. Many people with hypertension should eat less than 1,500 mg of sodium a day. Do not drink alcohol if: Your health care provider tells you not to drink. You are pregnant, may be pregnant, or are planning to become pregnant. If you drink alcohol: Limit how much you have to: 0-1 drink a day for women. 0-2 drinks a day for men. Know how much alcohol is in your drink. In the [place] one drink equals one 12 oz bottle of beer (355 mL), one 5 oz glass of wine (148 mL), or one 1 oz glass of hard liquor (44 mL). Lifestyle  Work with your health care provider to maintain a healthy body weight or to lose weight. Ask what an ideal weight is for you. Get at least 30 minutes of exercise that causes your heart to beat faster (aerobic exercise) most days of the week. Activities may include walking, swimming, or biking. Include exercise to strengthen your muscles (resistance exercise), such as Pilates or lifting weights, as part of your weekly exercise routine. Try to do these types of exercises for 30 minutes at least 3 days a week. Do not use any products that contain nicotine or tobacco. These products include cigarettes, chewing tobacco, and vaping devices, such as e-cigarettes. If you need help quitting, ask your health care provider. Monitor your blood pressure at home as told by your health care provider. Keep all follow-up visits. This is important. Medicines Take over-the-counter and prescription medicines only as told by your health care provider. Follow directions carefully. Blood  pressure medicines must be taken as prescribed. Do not skip doses of blood pressure medicine. Doing this puts you at risk for problems and can make the medicine less effective. Ask your health care provider about side effects or reactions to medicines that you should watch for. Contact a health care provider if you: Think you are having a reaction to a medicine you are taking. Have headaches that keep coming back (recurring). Feel dizzy. Have swelling in your ankles. Have trouble with your vision. Get help right away if you: Develop a severe headache or confusion. Have unusual weakness or numbness. Feel faint. Have severe pain in your chest or abdomen. Vomit repeatedly. Have trouble breathing. These symptoms may be an emergency. Get help right away. Call 911. Do not wait to see if the symptoms will go away. Do not drive yourself to the hospital. Summary Hypertension is when the force of blood pumping through your arteries is too strong. If this condition is not controlled, it may put you at risk for serious complications. Your personal target blood pressure may vary depending on your medical conditions, your age, and other factors. For most people, a normal blood pressure is less than 120/80. Hypertension is treated with lifestyle changes, medicines, or a combination of both. Lifestyle changes include losing weight, eating a healthy,  low-sodium diet, exercising more, and limiting alcohol. This information is not intended to replace advice given to you by your health care provider. Make sure you discuss any questions you have with your health care provider. Document Revised: 11/05/2020 Document Reviewed: 11/05/2020 Elsevier Patient Education  2024 [employer].

## 2023-03-09 ENCOUNTER — Encounter: Payer: Self-pay | Admitting: Family Medicine

## 2023-03-09 ENCOUNTER — Ambulatory Visit: Payer: Medicare HMO | Admitting: Family Medicine

## 2023-03-09 ENCOUNTER — Other Ambulatory Visit (HOSPITAL_COMMUNITY): Payer: Self-pay

## 2023-03-09 LAB — CBC WITH DIFFERENTIAL/PLATELET
Basophils Absolute: 0.1 10*3/uL (ref 0.0–0.1)
Basophils Relative: 0.9 % (ref 0.0–3.0)
Eosinophils Absolute: 0.1 10*3/uL (ref 0.0–0.7)
Eosinophils Relative: 2 % (ref 0.0–5.0)
HCT: 31.7 % — ABNORMAL LOW (ref 39.0–52.0)
Hemoglobin: 10.9 g/dL — ABNORMAL LOW (ref 13.0–17.0)
Lymphocytes Relative: 8.8 % — ABNORMAL LOW (ref 12.0–46.0)
Lymphs Abs: 0.6 10*3/uL — ABNORMAL LOW (ref 0.7–4.0)
MCHC: 34.5 g/dL (ref 30.0–36.0)
MCV: 92.7 fL (ref 78.0–100.0)
Monocytes Absolute: 0.5 10*3/uL (ref 0.1–1.0)
Monocytes Relative: 7.1 % (ref 3.0–12.0)
Neutro Abs: 5.9 10*3/uL (ref 1.4–7.7)
Neutrophils Relative %: 81.2 % — ABNORMAL HIGH (ref 43.0–77.0)
Platelets: 336 10*3/uL (ref 150.0–400.0)
RBC: 3.42 Mil/uL — ABNORMAL LOW (ref 4.22–5.81)
RDW: 15.5 % (ref 11.5–15.5)
WBC: 7.3 10*3/uL (ref 4.0–10.5)

## 2023-03-09 LAB — COMPREHENSIVE METABOLIC PANEL
ALT: 4 U/L (ref 0–53)
AST: 9 U/L (ref 0–37)
Albumin: 3.6 g/dL (ref 3.5–5.2)
Alkaline Phosphatase: 47 U/L (ref 39–117)
BUN: 22 mg/dL (ref 6–23)
CO2: 27 meq/L (ref 19–32)
Calcium: 8.4 mg/dL (ref 8.4–10.5)
Chloride: 108 meq/L (ref 96–112)
Creatinine, Ser: 1.2 mg/dL (ref 0.40–1.50)
GFR: 52.85 mL/min — ABNORMAL LOW (ref >60.00)
Glucose, Bld: 159 mg/dL — ABNORMAL HIGH (ref 70–99)
Potassium: 4.2 meq/L (ref 3.5–5.1)
Sodium: 144 meq/L (ref 135–145)
Total Bilirubin: 0.5 mg/dL (ref 0.2–1.2)
Total Protein: 6.1 g/dL (ref 6.0–8.3)

## 2023-03-09 LAB — LIPID PANEL
Cholesterol: 110 mg/dL (ref 0–200)
HDL: 50 mg/dL (ref >39.00)
LDL Cholesterol: 47 mg/dL (ref 0–99)
NonHDL: 60.1
Total CHOL/HDL Ratio: 2
Triglycerides: 64 mg/dL (ref 0.0–149.0)
VLDL: 12.8 mg/dL (ref 0.0–40.0)

## 2023-03-09 LAB — VITAMIN D 25 HYDROXY (VIT D DEFICIENCY, FRACTURES): VITD: 34.22 ng/mL (ref 30.00–100.00)

## 2023-03-09 LAB — TSH: TSH: 3.43 u[IU]/mL (ref 0.35–5.50)

## 2023-03-09 LAB — HEMOGLOBIN A1C: Hgb A1c MFr Bld: 5.2 % (ref 4.6–6.5)

## 2023-03-09 LAB — URIC ACID: Uric Acid, Serum: 6.9 mg/dL (ref 4.0–7.8)

## 2023-03-09 NOTE — Progress Notes (Signed)
 Subjective:    Patient ID: [name], male    DOB: Aug 29, 1929, 89 y.o.   MRN: [id number]  Chief Complaint  Patient presents with   Follow-up    HPI Discussed the use of AI scribe software for clinical note transcription with the patient, who gave verbal consent to proceed.  History of Present Illness Scott Perales Hedges "[name]" is a 89 year old male who presents with eye irritation and blurry vision.  He has been experiencing eye irritation and blurry vision for over two months. The eye discomfort is characterized by itchiness and inflammation. He has been using artificial tears and was advised to apply Vaseline around the eyes, which he has not consistently done. He applies hot compresses mostly in the mornings. No antibiotics have been prescribed for the eye condition yet. The blurry vision is occasional, particularly affecting the right eye, and impacts activities such as watching television.  He experiences recent episodes of nausea, particularly in the mornings, which have affected his appetite. During these episodes, he is unable to eat, although he usually takes his medication with a cake in the morning. No constipation, diarrhea, abdominal pain, or vomiting. He takes six stool softeners and three laxatives with stool softeners daily, which he has recently reduced by one.  He experiences excessive sleepiness, stating he can 'sleep twenty-four seven' and often stays up late, which affects his sleep schedule. He notes feeling better when he gets adequate sleep, around eight to ten hours, but feels lousy with only five to six hours of sleep.  He is currently taking vitamin D over the counter, though he is unsure of the dosage. He also consumes yogurt regularly.    Past Medical History:  Diagnosis Date   Acute bronchitis 06/10/2015   Allergic rhinitis    Anemia    Asthma    Carotid stenosis    a. s/p Left CEA 2002;  b. Carotid US 3/16:  patent R CEA, L < 40%   Chronic pain syndrome     Left shoulder, back   Closed fracture of neck of right femur (HCC)    Diverticulosis    Dyslipidemia    Epigastric pain 04/05/2016   Esophageal stricture    Distal, benign   Femur fracture, left (HCC) 10/27/2018   GERD (gastroesophageal reflux disease)    Hemorrhoids    HTN (hypertension)    Negative renal duplex 07-22-11   Hx of echocardiogram    a. Echo 10/11: mod LVH, EF 55-60%   Pleural effusion 07/17/2020   Prostate cancer (HCC) 2000   Seed XRT   S/P thoracentesis    Spondylosis, cervical, with myelopathy 11/26/2015   Stroke (HCC) 08/1999   right thalamic - on chronic Plavix/ASA    Past Surgical History:  Procedure Laterality Date   ANTERIOR APPROACH HEMI HIP ARTHROPLASTY Right 03/15/2021   Procedure: ANTERIOR APPROACH HEMI HIP ARTHROPLASTY;  Surgeon: Samson Frederic, MD;  Location: [facility] OR;  Service: Orthopedics;  Laterality: Right;   APPENDECTOMY  1953   CAROTID ENDARTERECTOMY Right 01/2000   CATARACT EXTRACTION Bilateral    CERVICAL DISC SURGERY  8/07   CHEST TUBE INSERTION Left 11/11/2020   Procedure: INSERTION PLEURAL DRAINAGE CATHETER;  Surgeon: Josephine Igo, DO;  Location: [facility];  Service: Pulmonary;  Laterality: Left;  Indwelling Pleural Catheter   HIP ARTHROPLASTY Left 10/24/2018   Procedure: ARTHROPLASTY BIPOLAR HIP (HEMIARTHROPLASTY);  Surgeon: Durene Romans, MD;  Location: [facility] OR;  Service: Orthopedics;  Laterality: Left;   INSERTION PROSTATE  RADIATION SEED  2000   LUMBAR DISC SURGERY  1990s   NASAL SINUS SURGERY     x 3   REMOVAL OF PLEURAL DRAINAGE CATHETER N/A 12/24/2020   Procedure: MINOR REMOVAL OF PLEURAL DRAINAGE CATHETER;  Surgeon: Josephine Igo, DO;  Location: [facility];  Service: Cardiopulmonary;  Laterality: N/A;   THORACENTESIS N/A 08/15/2020   Procedure: THORACENTESIS;  Surgeon: Josephine Igo, DO;  Location: [facility];  Service: Pulmonary;  Laterality: N/A;    Family History  Problem Relation Age of Onset   Stroke Brother     Stroke Sister    Stroke Mother    Hyperlipidemia Mother    Hypertension Mother    Lung disease Father    Diabetes Son    Gout Son    Obesity Sister    Alcohol abuse Brother    Cancer Brother        lung, smoker    Social History   Socioeconomic History   Marital status: Married    Spouse name: Not on file   Number of children: 1   Years of education: 10   Highest education level: Not on file  Occupational History   Occupation: Retired  Tobacco Use   Smoking status: Former    Current packs/day: 0.00    Average packs/day: 0.3 packs/day for 35.0 years (8.8 ttl pk-yrs)    Types: Cigarettes    Start date: 38    Quit date: 01/12/1978    Years since quitting: 45.1   Smokeless tobacco: Current    Types: Chew   Tobacco comments:    Started smoking at age 44  Vaping Use   Vaping status: Never Used  Substance and Sexual Activity   Alcohol use: No    Comment: Prior alcoholic, sober since 2002   Drug use: No   Sexual activity: Not Currently  Other Topics Concern   Not on file  Social History Narrative   Retired lives at home w/ his wife   1 daughter   Right-handed    Caffeine: drinks a lot of coffee    Former smoker, former alcoholic abstinent for many years   Social Drivers of Corporate investment banker Strain: Low Risk  (05/15/2021)   Overall Financial Resource Strain (CARDIA)    Difficulty of Paying Living Expenses: Not hard at all  Food Insecurity: No Food Insecurity (05/22/2022)   Hunger Vital Sign    Worried About Running Out of Food in the Last Year: Never true    Ran Out of Food in the Last Year: Never true  Transportation Needs: No Transportation Needs (05/22/2022)   PRAPARE - Administrator, Civil Service (Medical): No    Lack of Transportation (Non-Medical): No  Physical Activity: Insufficiently Active (05/15/2021)   Exercise Vital Sign    Days of Exercise per Week: 1 day    Minutes of Exercise per Session: 50 min  Stress: No Stress Concern  Present (05/15/2021)   [employer] of Occupational Health - Occupational Stress Questionnaire    Feeling of Stress : Not at all  Social Connections: Socially Integrated (05/15/2021)   Social Connection and Isolation Panel [NHANES]    Frequency of Communication with Friends and Family: More than three times a week    Frequency of Social Gatherings with Friends and Family: More than three times a week    Attends Religious Services: More than 4 times per year    Active Member of [employer] or Organizations: Yes  Attends Banker Meetings: More than 4 times per year    Marital Status: Married  Catering manager Violence: Not At Risk (05/15/2021)   Humiliation, Afraid, Rape, and Kick questionnaire    Fear of Current or Ex-Partner: No    Emotionally Abused: No    Physically Abused: No    Sexually Abused: No    Outpatient Medications Prior to Visit  Medication Sig Dispense Refill   acetaminophen (TYLENOL) 500 MG tablet Take 1,000 mg by mouth every 6 (six) hours as needed for mild pain or headache.     albuterol (VENTOLIN HFA) 108 (90 Base) MCG/ACT inhaler Inhale 1-2 puffs into the lungs every 6 (six) hours as needed for wheezing or shortness of breath. 18 g 1   amLODipine (NORVASC) 5 MG tablet Take 2 tablets (10 mg total) by mouth daily (need office visit) 180 tablet 3   apixaban (ELIQUIS) 5 MG TABS tablet Take 1 tablet (5 mg) by mouth 2 times daily. 180 tablet 3   atorvastatin (LIPITOR) 40 MG tablet Take 1 tablet (40 mg total) by mouth daily. 90 tablet 0   Cholecalciferol (VITAMIN D3 PO) Take 1 tablet by mouth in the morning. Unsure of how many units     docusate sodium (COLACE) 100 MG capsule Take 300 mg by mouth 2 (two) times daily.     Ferrous Fumarate (HEMOCYTE - 106 MG FE) 324 (106 Fe) MG TABS tablet Take 1 tablet (106 mg of iron total) by mouth 2 times daily. 30 tablet 2   furosemide (LASIX) 20 MG tablet Take 1 tablet (20 mg total) by mouth 2 (two) times a week. 30 tablet 3    furosemide (LASIX) 40 MG tablet 40 mg qday x 3 days. Then prn SOB/LE edema. 30 tablet 0   gabapentin (NEURONTIN) 300 MG capsule Take 1 capsule (300 mg total) by mouth 3 (three) times daily. (Patient taking differently: Take 300 mg by mouth at bedtime.) 270 capsule 1   lisinopril (ZESTRIL) 5 MG tablet Take 1 tablet (5 mg) by mouth daily. 90 tablet 3   metoCLOPramide (REGLAN) 5 MG tablet Take 1 tablet (5 mg total) by mouth at bedtime. Call for appointment for future refills. 30 tablet 3   minoxidil (LONITEN) 10 MG tablet Take 1 tablet (10 mg) by mouth 2 times daily. 180 tablet 3   omeprazole (PRILOSEC) 40 MG capsule Take 1 capsule (40 mg total) by mouth daily as needed. 30 capsule 5   ondansetron (ZOFRAN ODT) 8 MG disintegrating tablet Dissolve 1 tablet by mouth every 8 hours as needed for nausea or vomiting. 20 tablet 0   polyvinyl alcohol (LIQUIFILM TEARS) 1.4 % ophthalmic solution Place 1 drop into both eyes 5 (five) times daily as needed for dry eyes.     sennosides-docusate sodium (SENOKOT-S) 8.6-50 MG tablet Take 1-2 tablets by mouth See admin instructions. Take 1 tablet in the morning & take 2 tablet at night     traMADol (ULTRAM) 50 MG tablet Take 1 tablet (50 mg) by mouth 3 times daily as needed. 90 tablet 0   triamcinolone cream (KENALOG) 0.1 % Apply topically to affected areas of the skin daily as needed. 454 g 1   cephALEXin (KEFLEX) 500 MG capsule Take 1 capsule (500 mg total) by mouth 2 (two) times daily. 14 capsule 0   No facility-administered medications prior to visit.    Allergies  Allergen Reactions   Hctz [Hydrochlorothiazide] Shortness Of Breath, Swelling and Other (See Comments)    "  Swelling and dyspnea"    Hydralazine Shortness Of Breath and Other (See Comments)    Chest pain and GI issues also   Diovan [Valsartan] Other (See Comments)    Elevated potassium- Hyperkalemia   Metformin And Related Other (See Comments)    Reaction not recalled   Nsaids Other (See Comments)     Other than Tylenol, he isn't to have these because he's on Eliquis   Other Other (See Comments)    Clonidine B/P patch = Caused redness   Prednisone Other (See Comments)    Suicidal thoughts   Spironolactone Swelling    Site of swelling not recalled   Codeine Itching and Nausea Only   Oxycodone-Acetaminophen Itching and Nausea Only    Review of Systems  Constitutional:  Positive for malaise/fatigue. Negative for fever.  HENT:  Negative for congestion.   Eyes:  Positive for blurred vision, pain, discharge and redness.  Respiratory:  Negative for shortness of breath.   Cardiovascular:  Negative for chest pain, palpitations and leg swelling.  Gastrointestinal:  Positive for nausea. Negative for abdominal pain and blood in stool.  Genitourinary:  Negative for dysuria and frequency.  Musculoskeletal:  Negative for falls.  Skin:  Negative for rash.  Neurological:  Negative for dizziness, loss of consciousness and headaches.  Endo/Heme/Allergies:  Negative for environmental allergies.  Psychiatric/Behavioral:  Negative for depression. The patient is not nervous/anxious.        Objective:    Physical Exam Vitals reviewed.  Constitutional:      Appearance: Normal appearance. He is not ill-appearing.  HENT:     Head: Normocephalic and atraumatic.     Nose: Nose normal.  Eyes:     General:        Right eye: Discharge present.     Conjunctiva/sclera: Conjunctivae normal.  Cardiovascular:     Rate and Rhythm: Normal rate.     Pulses: Normal pulses.     Heart sounds: Normal heart sounds. No murmur heard. Pulmonary:     Effort: Pulmonary effort is normal.     Breath sounds: Normal breath sounds. No wheezing.  Abdominal:     Palpations: Abdomen is soft. There is no mass.     Tenderness: There is no abdominal tenderness.  Musculoskeletal:     Cervical back: Normal range of motion.     Right lower leg: No edema.     Left lower leg: No edema.  Skin:    General: Skin is warm  and dry.  Neurological:     General: No focal deficit present.     Mental Status: He is alert and oriented to person, place, and time.  Psychiatric:        Mood and Affect: Mood normal.     BP (!) 150/72 (BP Location: Left Arm, Patient Position: Sitting, Cuff Size: Normal)   Pulse 95   Temp 98.2 F (36.8 C) (Oral)   Resp 18   Ht 5\' 11"  (1.803 m)   Wt 147 lb 3.2 oz (66.8 kg)   SpO2 95%   BMI 20.53 kg/m  Wt Readings from Last 3 Encounters:  03/08/23 147 lb 3.2 oz (66.8 kg)  11/30/22 137 lb 12.8 oz (62.5 kg)  11/24/22 133 lb 6.4 oz (60.5 kg)    Diabetic Foot Exam - Simple   No data filed    Lab Results  Component Value Date   WBC 7.3 03/08/2023   HGB 10.9 (L) 03/08/2023   HCT 31.7 (L) 03/08/2023   PLT  336.0 03/08/2023   GLUCOSE 159 (H) 03/08/2023   CHOL 110 03/08/2023   TRIG 64.0 03/08/2023   HDL 50.00 03/08/2023   LDLCALC 47 03/08/2023   ALT 4 03/08/2023   AST 9 03/08/2023   NA 144 03/08/2023   K 4.2 03/08/2023   CL 108 03/08/2023   CREATININE 1.20 03/08/2023   BUN 22 03/08/2023   CO2 27 03/08/2023   TSH 3.43 03/08/2023   PSA 0.00 (L) 04/17/2021   INR 1.4 (H) 10/19/2021   HGBA1C 5.2 03/08/2023    Lab Results  Component Value Date   TSH 3.43 03/08/2023   Lab Results  Component Value Date   WBC 7.3 03/08/2023   HGB 10.9 (L) 03/08/2023   HCT 31.7 (L) 03/08/2023   MCV 92.7 03/08/2023   PLT 336.0 03/08/2023   Lab Results  Component Value Date   NA 144 03/08/2023   K 4.2 03/08/2023   CO2 27 03/08/2023   GLUCOSE 159 (H) 03/08/2023   BUN 22 03/08/2023   CREATININE 1.20 03/08/2023   BILITOT 0.5 03/08/2023   ALKPHOS 47 03/08/2023   AST 9 03/08/2023   ALT 4 03/08/2023   PROT 6.1 03/08/2023   ALBUMIN 3.6 03/08/2023   CALCIUM 8.4 03/08/2023   ANIONGAP 11 08/15/2022   EGFR 66 10/13/2022   GFR 52.85 (L) 03/08/2023   Lab Results  Component Value Date   CHOL 110 03/08/2023   Lab Results  Component Value Date   HDL 50.00 03/08/2023   Lab  Results  Component Value Date   LDLCALC 47 03/08/2023   Lab Results  Component Value Date   TRIG 64.0 03/08/2023   Lab Results  Component Value Date   CHOLHDL 2 03/08/2023   Lab Results  Component Value Date   HGBA1C 5.2 03/08/2023       Assessment & Plan:  Chronic diastolic congestive heart failure (HCC) Assessment & Plan: No recent exacerbation  no changes   Chronic renal impairment, unspecified CKD stage Assessment & Plan: Hydrate and monitor    Essential hypertension Assessment & Plan: Well controlled, no changes to meds. Encouraged heart healthy diet such as the DASH diet and exercise as tolerated.    Orders: -     Comprehensive metabolic panel -     CBC with Differential/Platelet -     TSH  Mixed hyperlipidemia Assessment & Plan: Encourage heart healthy diet such as MIND or DASH diet, increase exercise, avoid trans fats, simple carbohydrates and processed foods, consider a krill or fish or flaxseed oil cap daily.    Orders: -     Lipid panel  Vitamin D deficiency Assessment & Plan: Supplement and monitor   Orders: -     VITAMIN D 25 Hydroxy (Vit-D Deficiency, Fractures)  PAF (paroxysmal atrial fibrillation) (HCC) Assessment & Plan: Asymptomatic, tolerating meds   Other chronic pain -     Drug Monitoring Panel [phone] , Urine  High risk medication use -     Drug Monitoring Panel [phone] , Urine  Hyperglycemia -     Hemoglobin A1c  Left leg pain -     Uric acid  Other orders -     Doxycycline Hyclate; Take 1 tablet (100 mg total) by mouth 2 (two) times daily.  Dispense: 20 tablet; Refill: 0    Assessment and Plan Assessment & Plan Eye Inflammation Chronic inflammation and discomfort of the right eye for over two months. Patient has been using hot compresses and artificial tears inconsistently. No antibiotic treatment  has been tried yet. -Prescribe Doxycycline. -Advise consistent use of hot compresses twice daily, ideally in the morning  and at night.  Nausea Reports of intermittent nausea, particularly in the mornings. No associated vomiting, abdominal pain, constipation, or diarrhea. -Advise eating protein with carbohydrates to reduce stomach acidity. -Continue use of over-the-counter Zofran as needed for nausea.  General Health Maintenance -Order routine blood work to monitor overall health. -Continue over-the-counter Vitamin D supplementation. Check Vitamin D levels in blood work and adjust dosage as necessary based on results.     Danise Edge, MD

## 2023-03-11 ENCOUNTER — Other Ambulatory Visit (HOSPITAL_BASED_OUTPATIENT_CLINIC_OR_DEPARTMENT_OTHER): Payer: Self-pay

## 2023-03-11 LAB — DRUG MONITORING PANEL 376104, URINE
Amphetamines: NEGATIVE ng/mL (ref <500)
Barbiturates: NEGATIVE ng/mL (ref <300)
Benzodiazepines: NEGATIVE ng/mL (ref <100)
Cocaine Metabolite: NEGATIVE ng/mL (ref <150)
Desmethyltramadol: >10000 ng/mL — ABNORMAL HIGH (ref <100)
Opiates: NEGATIVE ng/mL (ref <100)
Oxycodone: NEGATIVE ng/mL (ref <100)
Tramadol: >10000 ng/mL — ABNORMAL HIGH (ref <100)

## 2023-03-11 LAB — DM TEMPLATE

## 2023-03-12 ENCOUNTER — Other Ambulatory Visit: Payer: Self-pay | Admitting: Emergency Medicine

## 2023-03-12 DIAGNOSIS — R739 Hyperglycemia, unspecified: Secondary | ICD-10-CM

## 2023-03-12 DIAGNOSIS — D649 Anemia, unspecified: Secondary | ICD-10-CM

## 2023-03-12 DIAGNOSIS — E782 Mixed hyperlipidemia: Secondary | ICD-10-CM

## 2023-03-12 DIAGNOSIS — E559 Vitamin D deficiency, unspecified: Secondary | ICD-10-CM

## 2023-03-12 DIAGNOSIS — I10 Essential (primary) hypertension: Secondary | ICD-10-CM

## 2023-03-17 ENCOUNTER — Other Ambulatory Visit (HOSPITAL_COMMUNITY): Payer: Self-pay

## 2023-03-17 ENCOUNTER — Other Ambulatory Visit: Payer: Self-pay | Admitting: Gastroenterology

## 2023-03-17 MED ORDER — METOCLOPRAMIDE HCL 5 MG PO TABS
5.0000 mg | ORAL_TABLET | Freq: Every day | ORAL | 3 refills | Status: DC
  Filled 2023-03-17: qty 30, 30d supply, fill #0
  Filled 2023-04-20: qty 30, 30d supply, fill #1
  Filled 2023-05-19: qty 30, 30d supply, fill #2
  Filled 2023-05-25 – 2023-06-15 (×2): qty 30, 30d supply, fill #3

## 2023-03-29 ENCOUNTER — Other Ambulatory Visit (HOSPITAL_COMMUNITY): Payer: Self-pay

## 2023-03-29 ENCOUNTER — Other Ambulatory Visit: Payer: Self-pay | Admitting: Family

## 2023-03-30 ENCOUNTER — Other Ambulatory Visit (HOSPITAL_COMMUNITY): Payer: Self-pay

## 2023-03-31 ENCOUNTER — Other Ambulatory Visit (HOSPITAL_COMMUNITY): Payer: Self-pay

## 2023-03-31 ENCOUNTER — Other Ambulatory Visit: Payer: Self-pay | Admitting: Family Medicine

## 2023-03-31 MED ORDER — TRAMADOL HCL 50 MG PO TABS
50.0000 mg | ORAL_TABLET | Freq: Three times a day (TID) | ORAL | 0 refills | Status: DC | PRN
  Filled 2023-03-31: qty 90, 30d supply, fill #0

## 2023-04-01 ENCOUNTER — Other Ambulatory Visit (HOSPITAL_COMMUNITY): Payer: Self-pay

## 2023-04-07 ENCOUNTER — Other Ambulatory Visit: Payer: Self-pay | Admitting: Family Medicine

## 2023-04-07 ENCOUNTER — Other Ambulatory Visit (HOSPITAL_COMMUNITY): Payer: Self-pay

## 2023-04-07 DIAGNOSIS — I10 Essential (primary) hypertension: Secondary | ICD-10-CM

## 2023-04-07 DIAGNOSIS — E559 Vitamin D deficiency, unspecified: Secondary | ICD-10-CM

## 2023-04-07 DIAGNOSIS — I6350 Cerebral infarction due to unspecified occlusion or stenosis of unspecified cerebral artery: Secondary | ICD-10-CM

## 2023-04-07 DIAGNOSIS — E782 Mixed hyperlipidemia: Secondary | ICD-10-CM

## 2023-04-07 MED ORDER — ATORVASTATIN CALCIUM 40 MG PO TABS
40.0000 mg | ORAL_TABLET | Freq: Every day | ORAL | 1 refills | Status: DC
Start: 2023-04-07 — End: 2023-10-05
  Filled 2023-04-07: qty 90, 90d supply, fill #0
  Filled 2023-07-07: qty 90, 90d supply, fill #1

## 2023-04-12 DIAGNOSIS — B078 Other viral warts: Secondary | ICD-10-CM | POA: Diagnosis not present

## 2023-04-12 DIAGNOSIS — C44622 Squamous cell carcinoma of skin of right upper limb, including shoulder: Secondary | ICD-10-CM | POA: Diagnosis not present

## 2023-04-12 DIAGNOSIS — L570 Actinic keratosis: Secondary | ICD-10-CM | POA: Diagnosis not present

## 2023-04-12 DIAGNOSIS — D0462 Carcinoma in situ of skin of left upper limb, including shoulder: Secondary | ICD-10-CM | POA: Diagnosis not present

## 2023-04-12 DIAGNOSIS — Z85828 Personal history of other malignant neoplasm of skin: Secondary | ICD-10-CM | POA: Diagnosis not present

## 2023-04-20 ENCOUNTER — Other Ambulatory Visit (HOSPITAL_COMMUNITY): Payer: Self-pay

## 2023-04-20 ENCOUNTER — Other Ambulatory Visit: Payer: Self-pay | Admitting: Family Medicine

## 2023-04-20 DIAGNOSIS — I10 Essential (primary) hypertension: Secondary | ICD-10-CM

## 2023-04-20 MED ORDER — LISINOPRIL 5 MG PO TABS
5.0000 mg | ORAL_TABLET | Freq: Every day | ORAL | 1 refills | Status: DC
Start: 2023-04-20 — End: 2023-10-26
  Filled 2023-04-20: qty 90, 90d supply, fill #0
  Filled 2023-07-27: qty 90, 90d supply, fill #1

## 2023-04-29 ENCOUNTER — Other Ambulatory Visit (HOSPITAL_COMMUNITY): Payer: Self-pay

## 2023-04-29 ENCOUNTER — Other Ambulatory Visit: Payer: Self-pay | Admitting: Family Medicine

## 2023-04-29 MED ORDER — TRAMADOL HCL 50 MG PO TABS
50.0000 mg | ORAL_TABLET | Freq: Three times a day (TID) | ORAL | 0 refills | Status: DC | PRN
  Filled 2023-04-29: qty 90, 30d supply, fill #0

## 2023-04-29 NOTE — Telephone Encounter (Signed)
 Requesting: tramadol 50mg   Contract: 03/08/23 UDS: 03/08/23 Last Visit: 03/08/23 Next Visit: 09/06/23 Last Refill: 03/31/23 #90 and 0RF   Please Advise

## 2023-04-30 ENCOUNTER — Other Ambulatory Visit (HOSPITAL_COMMUNITY): Payer: Self-pay

## 2023-05-19 ENCOUNTER — Other Ambulatory Visit (HOSPITAL_COMMUNITY): Payer: Self-pay

## 2023-05-20 ENCOUNTER — Other Ambulatory Visit (HOSPITAL_COMMUNITY): Payer: Self-pay

## 2023-05-21 ENCOUNTER — Other Ambulatory Visit (HOSPITAL_COMMUNITY): Payer: Self-pay

## 2023-05-25 ENCOUNTER — Other Ambulatory Visit: Payer: Self-pay

## 2023-05-25 ENCOUNTER — Other Ambulatory Visit (HOSPITAL_COMMUNITY): Payer: Self-pay

## 2023-05-26 ENCOUNTER — Other Ambulatory Visit (HOSPITAL_COMMUNITY): Payer: Self-pay

## 2023-05-26 ENCOUNTER — Other Ambulatory Visit: Payer: Self-pay | Admitting: Family Medicine

## 2023-05-26 MED ORDER — TRAMADOL HCL 50 MG PO TABS
50.0000 mg | ORAL_TABLET | Freq: Three times a day (TID) | ORAL | 0 refills | Status: DC | PRN
  Filled 2023-05-26 – 2023-05-28 (×2): qty 90, 30d supply, fill #0

## 2023-05-26 NOTE — Telephone Encounter (Signed)
 Requesting: tramadol  50mg   Contract:03/08/23 UDS: 03/08/23 Last Visit: 03/08/23 Next Visit: 09/06/23 Last Refill: 04/29/23 #90 and 0RF  Please Advise

## 2023-05-27 ENCOUNTER — Other Ambulatory Visit (HOSPITAL_COMMUNITY): Payer: Self-pay

## 2023-05-28 ENCOUNTER — Other Ambulatory Visit (HOSPITAL_COMMUNITY): Payer: Self-pay

## 2023-06-04 ENCOUNTER — Other Ambulatory Visit (HOSPITAL_COMMUNITY): Payer: Self-pay

## 2023-06-04 ENCOUNTER — Other Ambulatory Visit: Payer: Self-pay | Admitting: Internal Medicine

## 2023-06-04 DIAGNOSIS — I480 Paroxysmal atrial fibrillation: Secondary | ICD-10-CM

## 2023-06-04 MED ORDER — APIXABAN 5 MG PO TABS
5.0000 mg | ORAL_TABLET | Freq: Two times a day (BID) | ORAL | 1 refills | Status: DC
  Filled 2023-06-04: qty 180, 90d supply, fill #0
  Filled 2023-07-31: qty 60, 30d supply, fill #1
  Filled 2023-08-16 – 2023-08-30 (×2): qty 180, 90d supply, fill #1
  Filled ????-??-??: fill #1

## 2023-06-04 NOTE — Telephone Encounter (Signed)
 Eliquis  5mg  refill request received. Patient is 89 years old, weight-66.8kg, Crea-1.20 on 03/08/23, Diagnosis-afib, and last seen by Lawana Pray on 11/30/22. Dose is appropriate based on dosing criteria. Will send in refill to requested pharmacy.

## 2023-06-05 ENCOUNTER — Other Ambulatory Visit (HOSPITAL_COMMUNITY): Payer: Self-pay

## 2023-06-10 ENCOUNTER — Ambulatory Visit

## 2023-06-12 DIAGNOSIS — L02426 Furuncle of left lower limb: Secondary | ICD-10-CM | POA: Diagnosis not present

## 2023-06-19 ENCOUNTER — Other Ambulatory Visit: Payer: Self-pay | Admitting: Family Medicine

## 2023-06-21 ENCOUNTER — Other Ambulatory Visit (HOSPITAL_COMMUNITY): Payer: Self-pay

## 2023-06-21 MED ORDER — GABAPENTIN 300 MG PO CAPS
300.0000 mg | ORAL_CAPSULE | Freq: Three times a day (TID) | ORAL | 0 refills | Status: AC
  Filled 2023-06-21: qty 270, 90d supply, fill #0

## 2023-06-24 ENCOUNTER — Other Ambulatory Visit (HOSPITAL_COMMUNITY): Payer: Self-pay

## 2023-06-24 ENCOUNTER — Other Ambulatory Visit: Payer: Self-pay | Admitting: Family

## 2023-06-25 ENCOUNTER — Other Ambulatory Visit (HOSPITAL_COMMUNITY): Payer: Self-pay

## 2023-06-26 ENCOUNTER — Other Ambulatory Visit (HOSPITAL_COMMUNITY): Payer: Self-pay

## 2023-06-26 ENCOUNTER — Other Ambulatory Visit: Payer: Self-pay | Admitting: Family

## 2023-06-28 ENCOUNTER — Other Ambulatory Visit (HOSPITAL_COMMUNITY): Payer: Self-pay

## 2023-06-28 ENCOUNTER — Other Ambulatory Visit: Payer: Self-pay | Admitting: Family

## 2023-06-28 MED ORDER — TRAMADOL HCL 50 MG PO TABS
50.0000 mg | ORAL_TABLET | Freq: Three times a day (TID) | ORAL | 0 refills | Status: DC | PRN
  Filled 2023-06-28: qty 90, 30d supply, fill #0

## 2023-06-28 NOTE — Telephone Encounter (Signed)
 Copied from CRM [id number]. Topic: Clinical - Medication Question >> Jun 28, 2023  2:13 PM [name] wrote: Reason for CRM: patient and his wife called to check the prescription sent over by the pharmacy remined of the 3 business day for the refills and contact the pharmacy about the prescription status  traMADol  (ULTRAM ) 50 MG tablet [facility] [address] [address] Phone: [phone] Fax: [phone]  Pt number [phone] [name])

## 2023-06-28 NOTE — Telephone Encounter (Signed)
 Requesting: tramadol  50mg   Contract: 03/08/23 UDS: 03/08/23 Last Visit: 03/08/23 Next Visit: 09/06/23 Last Refill: 05/26/23 #90 and 0RF   Please Advise

## 2023-06-29 ENCOUNTER — Other Ambulatory Visit (HOSPITAL_COMMUNITY): Payer: Self-pay

## 2023-06-29 ENCOUNTER — Other Ambulatory Visit: Payer: Self-pay

## 2023-06-30 ENCOUNTER — Ambulatory Visit (INDEPENDENT_AMBULATORY_CARE_PROVIDER_SITE_OTHER): Admitting: *Deleted

## 2023-06-30 VITALS — Ht 71.0 in | Wt 139.8 lb

## 2023-06-30 DIAGNOSIS — Z0000 Encounter for general adult medical examination without abnormal findings: Secondary | ICD-10-CM

## 2023-06-30 NOTE — Patient Instructions (Addendum)
 [name] , Thank you for taking time out of your busy schedule to complete your Annual Wellness Visit with me. I enjoyed our conversation and look forward to speaking with you again next year. I, as well as your care team,  appreciate your ongoing commitment to your health goals. Please review the following plan we discussed and let me know if I can assist you in the future. Your Game plan/ To Do List    Follow up Visits: Next Medicare AWV with our clinical staff: 06/30/24 2:20pm   Next Office Visit with your provider: 09/06/23 2pm  Clinician Recommendations:  Aim for 30 minutes of exercise or brisk walking, 6-8 glasses of water , and 5 servings of fruits and vegetables each day.   Please get your shingles and tetanus vaccines at your local pharmacy.  This is a list of the screening recommended for you and due dates:  Health Maintenance  Topic Date Due   Complete foot exam   Never done   Zoster (Shingles) Vaccine (1 of 2) 08/20/1950   DTaP/Tdap/Td vaccine (1 - Tdap) 12/13/2013   Medicare Annual Wellness Visit  05/22/2023   Flu Shot  08/13/2023   Hemoglobin A1C  09/05/2023   COVID-19 Vaccine (5 - Pfizer risk 2024-25 season) 09/05/2023   Eye exam for diabetics  12/30/2023   Pneumococcal Vaccine for age over 98  Completed   HPV Vaccine  Aged Out   Meningitis B Vaccine  Aged Out    See attachments for Exercise.

## 2023-06-30 NOTE — Progress Notes (Signed)
 Please attest this visit in the absence of patient primary care provider.    Subjective:   [name] is a 89 y.o. who presents for a Medicare Wellness preventive visit.  As a reminder, Annual Wellness Visits don't include a physical exam, and some assessments may be limited, especially if this visit is performed virtually. We may recommend an in-person follow-up visit with your provider if needed.  Visit Complete: Virtual I connected with  [name] on 06/30/23 by a audio enabled telemedicine application and verified that I am speaking with the correct person using two identifiers.  Patient Location: Home  Provider Location: Office/Clinic  I discussed the limitations of evaluation and management by telemedicine. The patient expressed understanding and agreed to proceed.  Vital Signs: Because this visit was a virtual/telehealth visit, some criteria may be missing or patient reported. Any vitals not documented were not able to be obtained and vitals that have been documented are patient reported.  VideoDeclined- This patient declined Librarian, academic. Therefore the visit was completed with audio only.  Persons Participating in Visit: Patient.  AWV Questionnaire: No: Patient Medicare AWV questionnaire was not completed prior to this visit.  Cardiac Risk Factors include: advanced age (>20men, >100 women)     Objective:    Today's Vitals   06/30/23 1410  Weight: 139 lb 12.8 oz (63.4 kg)  Height: 5' 11 (1.803 m)   Body mass index is 19.5 kg/m.     06/30/2023    2:28 PM 08/15/2022    6:47 PM 05/22/2022    1:41 PM 10/19/2021    5:19 PM 05/15/2021    3:21 PM 03/14/2021    8:56 PM 12/24/2020    2:11 PM  Advanced Directives  Does Patient Have a Medical Advance Directive? Yes No Yes Yes Yes No;Yes Yes  Type of Estate agent of [employer] Power of [place];Living will Healthcare Power of [employer] of  [place];Living will Living will Healthcare Power of [place];Living will  Does patient want to make changes to medical advance directive? No - Patient declined  No - Patient declined No - Patient declined     Copy of Healthcare Power of Attorney in Chart? Yes - validated most recent copy scanned in chart (See row information)  Yes - validated most recent copy scanned in chart (See row information) No - copy requested Yes - validated most recent copy scanned in chart (See row information)  Yes - validated most recent copy scanned in chart (See row information)  Would patient like information on creating a medical advance directive?  No - Patient declined         Current Medications (verified) Outpatient Encounter Medications as of 06/30/2023  Medication Sig   acetaminophen  (TYLENOL ) 500 MG tablet Take 1,000 mg by mouth every 6 (six) hours as needed for mild pain or headache.   albuterol  (VENTOLIN  HFA) 108 (90 Base) MCG/ACT inhaler Inhale 1-2 puffs into the lungs every 6 (six) hours as needed for wheezing or shortness of breath.   amLODipine  (NORVASC ) 5 MG tablet Take 2 tablets (10 mg total) by mouth daily (need office visit)   apixaban  (ELIQUIS ) 5 MG TABS tablet Take 1 tablet (5 mg) by mouth 2 times daily.   atorvastatin  (LIPITOR) 40 MG tablet Take 1 tablet (40 mg total) by mouth daily.   Cholecalciferol (VITAMIN D3 PO) Take 1 tablet by mouth in the morning. Unsure of how many units   docusate  sodium (COLACE) 100 MG capsule Take 300 mg by mouth 2 (two) times daily.   Ferrous Fumarate  (HEMOCYTE - 106 MG FE) 324 (106 Fe) MG TABS tablet Take 1 tablet (106 mg of iron total) by mouth 2 times daily.   furosemide  (LASIX ) 20 MG tablet Take 1 tablet (20 mg total) by mouth 2 (two) times a week.   furosemide  (LASIX ) 40 MG tablet 40 mg qday x 3 days. Then prn SOB/LE edema.   gabapentin  (NEURONTIN ) 300 MG capsule Take 1 capsule (300 mg total) by mouth 3 (three) times daily.   lisinopril  (ZESTRIL ) 5 MG tablet  Take 1 tablet (5 mg total) by mouth daily.   metoCLOPramide  (REGLAN ) 5 MG tablet Take 1 tablet (5 mg total) by mouth at bedtime. Call for appointment for future refills.   minoxidil  (LONITEN ) 10 MG tablet Take 1 tablet (10 mg) by mouth 2 times daily.   omeprazole  (PRILOSEC) 40 MG capsule Take 1 capsule (40 mg total) by mouth daily as needed.   ondansetron  (ZOFRAN  ODT) 8 MG disintegrating tablet Dissolve 1 tablet by mouth every 8 hours as needed for nausea or vomiting.   polyvinyl alcohol  (LIQUIFILM TEARS) 1.4 % ophthalmic solution Place 1 drop into both eyes 5 (five) times daily as needed for dry eyes.   sennosides-docusate sodium  (SENOKOT-S) 8.6-50 MG tablet Take 1-2 tablets by mouth See admin instructions. Take 1 tablet in the morning & take 2 tablet at night   traMADol  (ULTRAM ) 50 MG tablet Take 1 tablet (50 mg) by mouth 3 times daily as needed.   triamcinolone  cream (KENALOG ) 0.1 % Apply topically to affected areas of the skin daily as needed.   [DISCONTINUED] doxycycline  (VIBRA -TABS) 100 MG tablet Take 1 tablet (100 mg total) by mouth 2 (two) times daily. (Patient not taking: Reported on 06/30/2023)   No facility-administered encounter medications on file as of 06/30/2023.    Allergies (verified) Hctz [hydrochlorothiazide], Hydralazine , Diovan [valsartan], Metformin and related, Nsaids, Other, Prednisone , Spironolactone , Codeine, and Oxycodone-acetaminophen    History: Past Medical History:  Diagnosis Date   Acute bronchitis 06/10/2015   Allergic rhinitis    Anemia    Asthma    Carotid stenosis    a. s/p Left CEA 2002;  b. Carotid US  3/16:  patent R CEA, L < 40%   Chronic pain syndrome    Left shoulder, back   Closed fracture of neck of right femur (HCC)    Diverticulosis    Dyslipidemia    Epigastric pain 04/05/2016   Esophageal stricture    Distal, benign   Femur fracture, left (HCC) 10/27/2018   GERD (gastroesophageal reflux disease)    Hemorrhoids    HTN (hypertension)     Negative renal duplex 07-22-11   Hx of echocardiogram    a. Echo 10/11: mod LVH, EF 55-60%   Pleural effusion 07/17/2020   Prostate cancer (HCC) 2000   Seed XRT   S/P thoracentesis    Spondylosis, cervical, with myelopathy 11/26/2015   Stroke (HCC) 08/1999   right thalamic - on chronic Plavix /ASA   Past Surgical History:  Procedure Laterality Date   ANTERIOR APPROACH HEMI HIP ARTHROPLASTY Right 03/15/2021   Procedure: ANTERIOR APPROACH HEMI HIP ARTHROPLASTY;  Surgeon: Adonica Hoose, MD;  Location: [facility] OR;  Service: Orthopedics;  Laterality: Right;   APPENDECTOMY  1953   CAROTID ENDARTERECTOMY Right 01/2000   CATARACT EXTRACTION Bilateral    CERVICAL DISC SURGERY  8/07   CHEST TUBE INSERTION Left 11/11/2020   Procedure: INSERTION PLEURAL  DRAINAGE CATHETER;  Surgeon: Prudy Brownie, DO;  Location: [facility] ENDOSCOPY;  Service: Pulmonary;  Laterality: Left;  Indwelling Pleural Catheter   HIP ARTHROPLASTY Left 10/24/2018   Procedure: ARTHROPLASTY BIPOLAR HIP (HEMIARTHROPLASTY);  Surgeon: Claiborne Crew, MD;  Location: [facility] OR;  Service: Orthopedics;  Laterality: Left;   INSERTION PROSTATE RADIATION SEED  2000   LUMBAR DISC SURGERY  1990s   NASAL SINUS SURGERY     x 3   REMOVAL OF PLEURAL DRAINAGE CATHETER N/A 12/24/2020   Procedure: MINOR REMOVAL OF PLEURAL DRAINAGE CATHETER;  Surgeon: Prudy Brownie, DO;  Location: [facility];  Service: Cardiopulmonary;  Laterality: N/A;   THORACENTESIS N/A 08/15/2020   Procedure: THORACENTESIS;  Surgeon: Prudy Brownie, DO;  Location: [facility];  Service: Pulmonary;  Laterality: N/A;   Family History  Problem Relation Age of Onset   Stroke Brother    Stroke Sister    Stroke Mother    Hyperlipidemia Mother    Hypertension Mother    Lung disease Father    Diabetes Son    Gout Son    Obesity Sister    Alcohol  abuse Brother    Cancer Brother        lung, smoker   Social History   Socioeconomic History   Marital status: Married    Spouse  name: Not on file   Number of children: 1   Years of education: 10   Highest education level: Not on file  Occupational History   Occupation: Retired  Tobacco Use   Smoking status: Former    Current packs/day: 0.00    Average packs/day: 0.3 packs/day for 35.0 years (8.8 ttl pk-yrs)    Types: Cigarettes    Start date: 50    Quit date: 01/12/1978    Years since quitting: 45.4   Smokeless tobacco: Current    Types: Chew   Tobacco comments:    Started smoking at age 75  Vaping Use   Vaping status: Never Used  Substance and Sexual Activity   Alcohol  use: No    Comment: Prior alcoholic, sober since 2002   Drug use: No   Sexual activity: Not Currently  Other Topics Concern   Not on file  Social History Narrative   Retired lives at home w/ his wife   1 daughter   Right-handed    Caffeine: drinks a lot of coffee    Former smoker, former alcoholic abstinent for many years   Social Drivers of Corporate investment banker Strain: Low Risk  (06/30/2023)   Overall Financial Resource Strain (CARDIA)    Difficulty of Paying Living Expenses: Not very hard  Food Insecurity: No Food Insecurity (06/30/2023)   Hunger Vital Sign    Worried About Running Out of Food in the Last Year: Never true    Ran Out of Food in the Last Year: Never true  Transportation Needs: No Transportation Needs (06/30/2023)   PRAPARE - Administrator, Civil Service (Medical): No    Lack of Transportation (Non-Medical): No  Physical Activity: Inactive (06/30/2023)   Exercise Vital Sign    Days of Exercise per Week: 0 days    Minutes of Exercise per Session: 0 min  Stress: No Stress Concern Present (06/30/2023)   [employer] of Occupational Health - Occupational Stress Questionnaire    Feeling of Stress: Not at all  Social Connections: Socially Integrated (05/15/2021)   Social Connection and Isolation Panel    Frequency of Communication with Friends and  Family: More than three times a week     Frequency of Social Gatherings with Friends and Family: More than three times a week    Attends Religious Services: More than 4 times per year    Active Member of [employer] or Organizations: Yes    Attends Engineer, structural: More than 4 times per year    Marital Status: Married    Tobacco Counseling Ready to quit: Not Answered Counseling given: Not Answered Tobacco comments: Started smoking at age 29    Clinical Intake:  Pre-visit preparation completed: Yes  Pain : No/denies pain     BMI - recorded: 19.5 Nutritional Risks: Other (Comment) Diabetes: No  Lab Results  Component Value Date   HGBA1C 5.2 03/08/2023   HGBA1C 5.6 08/27/2022   HGBA1C 5.5 04/07/2022     How often do you need to have someone help you when you read instructions, pamphlets, or other written materials from your doctor or pharmacy?: 1 - Never What is the last grade level you completed in school?: 10th  Interpreter Needed?: No (doesn't see as well as used to and spouse helps)  Information entered by :: Susa Engman, CMA   Activities of Daily Living     06/30/2023    2:21 PM  In your present state of health, do you have any difficulty performing the following activities:  Preparing Food and eating ? N  Using the Toilet? N  In the past six months, have you accidently leaked urine? N  Do you have problems with loss of bowel control? N  Managing your Medications? N  Managing your Finances? N  Housekeeping or managing your Housekeeping? N    Patient Care Team: Neda Balk, MD as PCP - General (Family Medicine) Elmyra Haggard, MD as PCP - Cardiology (Cardiology) Early, Sherre Docker, MD (Inactive) (Vascular Surgery) Alvina Axon, MD (Ophthalmology) Gaynelle Keeling, MD as Consulting Physician (Dermatology) Kenney Peacemaker, MD as Consulting Physician (Gastroenterology) Thelda Finney Lucie Ruts, DO (Inactive) as Consulting Physician (Pulmonary Disease) Zula Hitch, MD as Consulting  Physician (Ophthalmology)  I have updated your Care Teams any recent Medical Services you may have received from other providers in the past year.     Assessment:   This is a routine wellness examination for Blayden.  Hearing/Vision screen Hearing Screening - Comments:: Denies hearing difficulties.  Vision Screening - Comments:: Wears RX glasses -- 12/30/22, with [facility]   Goals Addressed   None    Depression Screen     06/30/2023    2:24 PM 05/22/2022    1:44 PM 04/07/2022    1:27 PM 10/07/2021    1:56 PM 05/15/2021    3:20 PM 07/16/2020    4:03 PM 07/01/2018    1:43 PM  PHQ 2/9 Scores  PHQ - 2 Score 0 1 0 0 0 0 0  PHQ- 9 Score   2 0       Fall Risk     06/30/2023    2:24 PM 05/22/2022    1:43 PM 04/07/2022    1:28 PM 04/07/2022    1:27 PM 10/07/2021    1:56 PM  Fall Risk   Falls in the past year? 1 1 0 0 0  Number falls in past yr: 0 0 0 0 0  Injury with Fall? 0 0 0 0 0  Risk for fall due to : Impaired balance/gait Impaired balance/gait     Follow up Education provided Falls evaluation completed Falls evaluation completed  Falls evaluation completed Falls evaluation completed      Data saved with a previous flowsheet row definition    MEDICARE RISK AT HOME:  Medicare Risk at Home Any stairs in or around the home?: Yes If so, are there any without handrails?: No Home free of loose throw rugs in walkways, pet beds, electrical cords, etc?: Yes Adequate lighting in your home to reduce risk of falls?: Yes Life alert?: No Use of a cane, walker or w/c?: Yes (uses walker) Grab bars in the bathroom?: Yes Shower chair or bench in shower?: Yes Elevated toilet seat or a handicapped toilet?: Yes  TIMED UP AND GO:  Was the test performed?  No, audio  Cognitive Function: 6CIT completed        05/22/2022    1:49 PM  6CIT Screen  What Year? 4 points  What month? 0 points  What time? 0 points  Count back from 20 2 points  Months in reverse 4 points  Repeat  phrase 4 points  Total Score 14 points    Immunizations Immunization History  Administered Date(s) Administered   Fluad Quad(high Dose 65+) 10/17/2018, 10/07/2021   Influenza Split 09/12/2012   Influenza Whole 11/04/2006, 10/11/2008, 09/24/2011   Influenza, High Dose Seasonal PF 10/08/2015, 11/02/2017, 11/03/2022   Influenza-Unspecified 10/05/2013, 10/13/2014   PFIZER Comirnaty (Gray Top)Covid-19 Tri-Sucrose Vaccine 08/30/2020   PFIZER(Purple Top)SARS-COV-2 Vaccination 02/05/2019, 02/27/2019   Pfizer(Comirnaty )Fall Seasonal Vaccine 12 years and older 03/08/2023   Pneumococcal Conjugate-13 10/05/2013   Pneumococcal Polysaccharide-23 01/13/2003, 12/10/2014   Rsv, Bivalent, Protein Subunit Rsvpref,pf Pattricia Bores) 12/18/2021   Tetanus 12/12/2013   Zoster, Live 11/24/2011    Screening Tests Health Maintenance  Topic Date Due   FOOT EXAM  Never done   Zoster Vaccines- Shingrix (1 of 2) 08/20/1948   DTaP/Tdap/Td (1 - Tdap) 12/13/2013   Medicare Annual Wellness (AWV)  05/22/2023   INFLUENZA VACCINE  08/13/2023   HEMOGLOBIN A1C  09/05/2023   COVID-19 Vaccine (5 - Pfizer risk 2024-25 season) 09/05/2023   OPHTHALMOLOGY EXAM  12/30/2023   Pneumococcal Vaccine: 50+ Years  Completed   HPV VACCINES  Aged Out   Meningococcal B Vaccine  Aged Out    Health Maintenance  Health Maintenance Due  Topic Date Due   FOOT EXAM  Never done   Zoster Vaccines- Shingrix (1 of 2) 08[birth year]20/1948   DTaP/Tdap/Td (1 - Tdap) 12/13/2013   Medicare Annual Wellness (AWV)  05/22/2023   Health Maintenance Items Addressed: Pt will get tetanus and shingles vaccins at pharmacy. All other HM up to date.  Additional Screening:  Vision Screening: Recommended annual ophthalmology exams for early detection of glaucoma and other disorders of the eye. Would you like a referral to an eye doctor? No    Dental Screening: Recommended annual dental exams for proper oral hygiene  Community Resource Referral / Chronic Care  Management: CRR required this visit?  No   CCM required this visit?  No   Plan:    I have personally reviewed and noted the following in the patient's chart:   Medical and social history Use of alcohol , tobacco or illicit drugs  Current medications and supplements including opioid prescriptions. Patient is not currently taking opioid prescriptions. Functional ability and status Nutritional status Physical activity Advanced directives List of other physicians Hospitalizations, surgeries, and ER visits in previous 12 months Vitals Screenings to include cognitive, depression, and falls Referrals and appointments  In addition, I have reviewed and discussed with patient certain preventive protocols, quality metrics, and  best practice recommendations. A written personalized care plan for preventive services as well as general preventive health recommendations were provided to patient.   Susa Engman, CMA   06/30/2023   After Visit Summary: (MyChart) Due to this being a telephonic visit, the after visit summary with patients personalized plan was offered to patient via MyChart   Notes: Nothing significant to report at this time.

## 2023-07-07 ENCOUNTER — Other Ambulatory Visit (HOSPITAL_COMMUNITY): Payer: Self-pay

## 2023-07-09 ENCOUNTER — Telehealth: Payer: Self-pay | Admitting: Pharmacy Technician

## 2023-07-09 NOTE — Telephone Encounter (Addendum)
 PAP: Application for Eliquis  has been submitted to [employer] ([employer]), via fax   Faxed patient portion, provider portion, out of pocket and insurance. Missing proof of income

## 2023-07-13 NOTE — Telephone Encounter (Signed)
 I spoke to [employer] and they said they received everything but the provider portion even though it was in the same fax. Faxed provider portion again.

## 2023-07-15 ENCOUNTER — Other Ambulatory Visit (HOSPITAL_COMMUNITY): Payer: Self-pay

## 2023-07-15 NOTE — Telephone Encounter (Signed)
   They paid 387.00 for 3 months last time.   He has paid: This year so far. [employer] said they need to pay close to 2,000 before they would qualify. Waiting until 08/18/23 to run eliquis  and see what new copay is or try xarelto, pradaxa, warfarin. The wife said they both have been taking the eliquis  they got on 06/04/23 since they both take it and they could only afford the one copay of 387.00. she said they are almost out of that since they are both taking it. For the wife, the copay is 416.69 just for 30 days of eliquis , unclear what his would be until we run on 08/18/23. For her the cheapest is generic pradaxa coming in at 108.30 for 30 days. I told her about the medicare payment plan. She said she doesn't want them on warfarin. I called [employer] and they said they have not paid enough to say when they would be approved by. [employer] is showing they make 40,000. The patient said they only make 31,000 but there is money in retirement so [employer] is adding that to get the 40,000. Patient wife is going to check their taxes to see if they do make 40,000 and call back

## 2023-07-20 ENCOUNTER — Other Ambulatory Visit (HOSPITAL_COMMUNITY): Payer: Self-pay

## 2023-07-20 ENCOUNTER — Other Ambulatory Visit: Payer: Self-pay | Admitting: Gastroenterology

## 2023-07-22 ENCOUNTER — Other Ambulatory Visit (HOSPITAL_COMMUNITY): Payer: Self-pay

## 2023-07-22 MED ORDER — METOCLOPRAMIDE HCL 5 MG PO TABS
5.0000 mg | ORAL_TABLET | Freq: Every day | ORAL | 3 refills | Status: DC
  Filled 2023-07-22: qty 30, 30d supply, fill #0
  Filled 2023-08-24: qty 30, 30d supply, fill #1
  Filled 2023-09-23: qty 30, 30d supply, fill #2
  Filled 2023-10-26: qty 30, 30d supply, fill #3

## 2023-07-26 ENCOUNTER — Other Ambulatory Visit (HOSPITAL_COMMUNITY): Payer: Self-pay

## 2023-07-27 ENCOUNTER — Other Ambulatory Visit: Payer: Self-pay | Admitting: Family

## 2023-07-27 ENCOUNTER — Other Ambulatory Visit (HOSPITAL_COMMUNITY): Payer: Self-pay

## 2023-07-29 ENCOUNTER — Telehealth: Payer: Self-pay | Admitting: Family Medicine

## 2023-07-29 ENCOUNTER — Other Ambulatory Visit (HOSPITAL_COMMUNITY): Payer: Self-pay

## 2023-07-29 ENCOUNTER — Other Ambulatory Visit: Payer: Self-pay | Admitting: Family Medicine

## 2023-07-29 MED ORDER — TRAMADOL HCL 50 MG PO TABS
50.0000 mg | ORAL_TABLET | Freq: Three times a day (TID) | ORAL | 0 refills | Status: DC | PRN
  Filled 2023-07-29: qty 90, 30d supply, fill #0

## 2023-07-29 NOTE — Telephone Encounter (Signed)
 Copied from CRM [id number]. Topic: Clinical - Medication Refill >> Jul 29, 2023  3:44 PM Sophia H wrote: Medication: traMADol  (ULTRAM ) 50 MG tablet **Patient had been been getting medication prescribed by FNP Douglass, wanting to know if this is something Dr. Domenica can prescribe. Please advise # [phone]  Has the patient contacted their pharmacy? Yes, told to contact pharmacy    This is the patient's preferred pharmacy:  [place] [facility] [address] [place] [address] Phone: [phone] Fax: [phone]  Is this the correct pharmacy for this prescription? Yes If no, delete pharmacy and type the correct one.   Has the prescription been filled recently? Yes  Is the patient out of the medication? Yes   Has the patient been seen for an appointment in the last year OR does the patient have an upcoming appointment? Yes, August 25th - Dr. Domenica   Can we respond through MyChart? Yes  Agent: Please be advised that Rx refills may take up to 3 business days. We ask that you follow-up with your pharmacy.

## 2023-07-29 NOTE — Telephone Encounter (Signed)
 Requesting: tramadol  50mg   Contract: 03/08/23 UDS: 03/08/23 Last Visit: 03/08/23 Next Visit: 09/06/23 Last Refill: 06/28/23 #90 and 0RF  Please Advise

## 2023-07-29 NOTE — Telephone Encounter (Signed)
 Refill request received earlier, has been sent to PCP

## 2023-07-29 NOTE — Telephone Encounter (Signed)
  RE: traMADol  (ULTRAM ) 50 MG tablet Patient had been been getting medication prescribed by FNP Douglass, wanting to know if this is something Dr. Domenica can prescribe. Please advise # [phone]

## 2023-07-30 ENCOUNTER — Other Ambulatory Visit (HOSPITAL_COMMUNITY): Payer: Self-pay

## 2023-07-31 ENCOUNTER — Other Ambulatory Visit (HOSPITAL_COMMUNITY): Payer: Self-pay

## 2023-08-02 ENCOUNTER — Other Ambulatory Visit (HOSPITAL_COMMUNITY): Payer: Self-pay

## 2023-08-16 ENCOUNTER — Other Ambulatory Visit (HOSPITAL_COMMUNITY): Payer: Self-pay

## 2023-08-18 ENCOUNTER — Other Ambulatory Visit (HOSPITAL_COMMUNITY): Payer: Self-pay

## 2023-08-18 NOTE — Telephone Encounter (Signed)
 Now eliquis  is 141.00 for 90 days so 47.00/30ds. ready at the pharmacy  Patient aware. Wife to get medication and get me the oop when back form out of town so we can send to [employer] to see what they say this time

## 2023-08-20 ENCOUNTER — Other Ambulatory Visit (HOSPITAL_COMMUNITY): Payer: Self-pay

## 2023-08-24 ENCOUNTER — Other Ambulatory Visit: Payer: Self-pay | Admitting: Internal Medicine

## 2023-08-24 NOTE — Progress Notes (Deleted)
 Cardiology Office Note   Date:  08/24/2023   ID:  [name], DOB 08/30/1929, MRN [id number]  PCP:  Domenica Harlene LABOR, MD  Cardiologist:   Vina Gull, MD   F/U of HTN   History of Present Illness: [name] is a 89 y.o. male with a history of HTN (very labile in past), CV dz (s/p L CEA 2002), HL, GERD, reactive airway dz, esophageal stricture, and PAF   Echo in 2020 LVEF and RVEF normal    2022 pt has been seen by pulmonary for pleural effusion   Thoracentesis in July 2022   Cytology neg for malignancy, lymphocytes  Concern for possible malignancy   Effusoin recurred    Diuresed with low dose lasix   Underwent repeat thoracentesis of 2 L on 8/4 again of amber colored fluid, cytology negative     I saw the pt in June 2023    The pt comes in today with his wife     He was seen in [place] ER on 08/15/22 with for SOB, congestion   He was given IV lasix  and sent home on a few days of 40 mg lasix  x 3   The [name] says his ankle swelling went down with this and that it has come back some since stopping  The pt denies CP   Says his appetitie is fair  He says his breathing is OK   I saw the pt in Sept 2024   He was seen by [name] in Nov 2024    No outpatient medications have been marked as taking for the 08/26/23 encounter (Appointment) with Gull Vina GAILS, MD.     Allergies:   Hctz [hydrochlorothiazide], Hydralazine , Diovan [valsartan], Metformin and related, Nsaids, Other, Prednisone , Spironolactone , Codeine, and Oxycodone-acetaminophen    Past Medical History:  Diagnosis Date   Acute bronchitis 06/10/2015   Allergic rhinitis    Anemia    Asthma    Carotid stenosis    a. s/p Left CEA 2002;  b. Carotid US  3/16:  patent R CEA, L < 40%   Chronic pain syndrome    Left shoulder, back   Closed fracture of neck of right femur (HCC)    Diverticulosis    Dyslipidemia    Epigastric pain 04/05/2016   Esophageal stricture    Distal, benign   Femur fracture, left (HCC) 10/27/2018    GERD (gastroesophageal reflux disease)    Hemorrhoids    HTN (hypertension)    Negative renal duplex 07-22-11   Hx of echocardiogram    a. Echo 10/11: mod LVH, EF 55-60%   Pleural effusion 07/17/2020   Prostate cancer (HCC) 2000   Seed XRT   S/P thoracentesis    Spondylosis, cervical, with myelopathy 11/26/2015   Stroke (HCC) 08/1999   right thalamic - on chronic Plavix /ASA    Past Surgical History:  Procedure Laterality Date   ANTERIOR APPROACH HEMI HIP ARTHROPLASTY Right 03/15/2021   Procedure: ANTERIOR APPROACH HEMI HIP ARTHROPLASTY;  Surgeon: Fidel Rogue, MD;  Location: [facility] OR;  Service: Orthopedics;  Laterality: Right;   APPENDECTOMY  1953   CAROTID ENDARTERECTOMY Right 01/2000   CATARACT EXTRACTION Bilateral    CERVICAL DISC SURGERY  8/07   CHEST TUBE INSERTION Left 11/11/2020   Procedure: INSERTION PLEURAL DRAINAGE CATHETER;  Surgeon: Brenna Adine CROME, DO;  Location: [facility];  Service: Pulmonary;  Laterality: Left;  Indwelling Pleural Catheter   HIP ARTHROPLASTY Left 10/24/2018   Procedure: ARTHROPLASTY BIPOLAR HIP (HEMIARTHROPLASTY);  Surgeon: Ernie Cough, MD;  [facility]: [facility];  Service: Orthopedics;  Laterality: Left;   INSERTION PROSTATE RADIATION SEED  2000   LUMBAR DISC SURGERY  1990s   NASAL SINUS SURGERY     x 3   REMOVAL OF PLEURAL DRAINAGE CATHETER N/A 12/24/2020   Procedure: MINOR REMOVAL OF PLEURAL DRAINAGE CATHETER;  Surgeon: Brenna Adine CROME, DO;  Location: [facility];  Service: Cardiopulmonary;  Laterality: N/A;   THORACENTESIS N/A 08/15/2020   Procedure: THORACENTESIS;  Surgeon: Brenna Adine CROME, DO;  Location: [facility];  Service: Pulmonary;  Laterality: N/A;     Social History:  The patient  reports that he quit smoking about 45 years ago. His smoking use included cigarettes. He started smoking about 75 years ago. He has a 8.8 pack-year smoking history. His smokeless tobacco use includes chew. He reports that he does not drink alcohol  and does  not use drugs.   Family History:  The patient's family history includes Alcohol  abuse in his brother; Cancer in his brother; Diabetes in his son; Gout in his son; Hyperlipidemia in his mother; Hypertension in his mother; Lung disease in his father; Obesity in his sister; Stroke in his brother, mother, and sister.    ROS:  Please see the history of present illness. All other systems are reviewed and  Negative to the above problem except as noted.    PHYSICAL EXAM: VS:  There were no vitals taken for this visit.  GEN: Thin 89 yo in no acute distress  HEENT: normal  Neck: no JVD Cardiac: RRR; no murmur;   Trivial LE edema  Respiratory: Decresed airflow   + wheezes   GI: soft, nontender, nondistended, + BS     EKG:  EKG is not ordered today         Lipid Panel    Component Value Date/Time   CHOL 110 03/08/2023 1640   TRIG 64.0 03/08/2023 1640   HDL 50.00 03/08/2023 1640   CHOLHDL 2 03/08/2023 1640   VLDL 12.8 03/08/2023 1640   LDLCALC 47 03/08/2023 1640      Wt Readings from Last 3 Encounters:  06/30/23 139 lb 12.8 oz (63.4 kg)  03/08/23 147 lb 3.2 oz (66.8 kg)  11/30/22 137 lb 12.8 oz (62.5 kg)      ASSESSMENT AND PLAN:  1 HTN   BP is controlled   Keep on current regimen    2  PAF  Pt clinically in SR  He denies palpitations   Continue Eliquis       3 HFpEF  Volume does appear to be mildly increased  I would  recomm he take 20 lasix  2x per week Check BMET and BNP today   4 Pulmonary  Wheezing today     He has had in past  Recom using inhaler regularly       3  CV dz   Mild dz bilaterally   4  Hx CVAs   Seen in CT  Continue  anticoagulation   5  HL  Keep on lipitor  LDL  HDL 49       Current medicines are reviewed at length with the patient today.  The patient does not have concerns regarding medicines.  Signed, Vina Gull, MD  08/24/2023 9:37 AM    [facility] [address] [place], [place], [place]  [address] Phone: [phone]; Fax: [phone]

## 2023-08-25 ENCOUNTER — Other Ambulatory Visit: Payer: Self-pay

## 2023-08-26 ENCOUNTER — Other Ambulatory Visit (HOSPITAL_COMMUNITY): Payer: Self-pay

## 2023-08-26 ENCOUNTER — Ambulatory Visit: Admitting: Internal Medicine

## 2023-08-26 MED ORDER — MINOXIDIL 10 MG PO TABS
10.0000 mg | ORAL_TABLET | Freq: Two times a day (BID) | ORAL | 3 refills | Status: AC
  Filled 2023-08-26: qty 180, 90d supply, fill #0
  Filled 2023-11-24 – 2023-11-25 (×3): qty 180, 90d supply, fill #1

## 2023-08-28 ENCOUNTER — Other Ambulatory Visit (HOSPITAL_COMMUNITY): Payer: Self-pay

## 2023-08-28 ENCOUNTER — Other Ambulatory Visit: Payer: Self-pay | Admitting: Family Medicine

## 2023-08-30 ENCOUNTER — Other Ambulatory Visit: Payer: Self-pay

## 2023-08-30 ENCOUNTER — Other Ambulatory Visit (HOSPITAL_COMMUNITY): Payer: Self-pay

## 2023-08-30 MED ORDER — TRAMADOL HCL 50 MG PO TABS
50.0000 mg | ORAL_TABLET | Freq: Three times a day (TID) | ORAL | 0 refills | Status: DC | PRN
  Filled 2023-08-30: qty 90, 30d supply, fill #0

## 2023-08-30 NOTE — Telephone Encounter (Signed)
 Requesting: tramadol  50mg  Contract: Yes UDS: 03/08/23 Last Visit: 03/08/2023 Next Visit: 09/06/2023 Last Refill: 07/29/23  Please Advise

## 2023-08-31 ENCOUNTER — Other Ambulatory Visit (HOSPITAL_COMMUNITY): Payer: Self-pay

## 2023-09-01 ENCOUNTER — Telehealth: Payer: Self-pay | Admitting: Pharmacy Technician

## 2023-09-01 NOTE — Telephone Encounter (Addendum)
 Faxed more oop to [facility] for [id number] for reconsideration   Eliquis  is 47.00 a month for him now but his wife is on it as well

## 2023-09-05 NOTE — Assessment & Plan Note (Signed)
 Albuterol prn

## 2023-09-05 NOTE — Assessment & Plan Note (Signed)
 Hydrate and monitor

## 2023-09-05 NOTE — Assessment & Plan Note (Signed)
 Patient encouraged to maintain heart healthy diet, regular exercise, adequate sleep. Consider daily probiotics. Take medications as prescribed. Labs ordered and reviewed. Has aged out of Colonoscopy Has ACP documents in chart

## 2023-09-05 NOTE — Assessment & Plan Note (Signed)
Asymptomatic, tolerating meds. 

## 2023-09-05 NOTE — Assessment & Plan Note (Signed)
 No recent exacerbation no changes

## 2023-09-05 NOTE — Assessment & Plan Note (Signed)
 Well controlled, no changes to meds. Encouraged heart healthy diet such as the DASH diet and exercise as tolerated.

## 2023-09-05 NOTE — Assessment & Plan Note (Signed)
 [id number] acceptable, minimize simple carbs. Increase exercise as tolerated. Continue current meds

## 2023-09-05 NOTE — Assessment & Plan Note (Signed)
 Supplement and monitor

## 2023-09-06 ENCOUNTER — Other Ambulatory Visit (HOSPITAL_COMMUNITY): Payer: Self-pay

## 2023-09-06 ENCOUNTER — Ambulatory Visit (INDEPENDENT_AMBULATORY_CARE_PROVIDER_SITE_OTHER): Payer: Medicare HMO | Admitting: Family Medicine

## 2023-09-06 ENCOUNTER — Encounter: Payer: Self-pay | Admitting: Family Medicine

## 2023-09-06 VITALS — BP 130/52 | HR 63 | Resp 16 | Ht 71.0 in | Wt 144.4 lb

## 2023-09-06 DIAGNOSIS — I10 Essential (primary) hypertension: Secondary | ICD-10-CM

## 2023-09-06 DIAGNOSIS — I5032 Chronic diastolic (congestive) heart failure: Secondary | ICD-10-CM | POA: Diagnosis not present

## 2023-09-06 DIAGNOSIS — I480 Paroxysmal atrial fibrillation: Secondary | ICD-10-CM

## 2023-09-06 DIAGNOSIS — E1165 Type 2 diabetes mellitus with hyperglycemia: Secondary | ICD-10-CM

## 2023-09-06 DIAGNOSIS — J45909 Unspecified asthma, uncomplicated: Secondary | ICD-10-CM | POA: Diagnosis not present

## 2023-09-06 DIAGNOSIS — Z0000 Encounter for general adult medical examination without abnormal findings: Secondary | ICD-10-CM

## 2023-09-06 DIAGNOSIS — E559 Vitamin D deficiency, unspecified: Secondary | ICD-10-CM

## 2023-09-06 DIAGNOSIS — N189 Chronic kidney disease, unspecified: Secondary | ICD-10-CM | POA: Diagnosis not present

## 2023-09-06 NOTE — Progress Notes (Signed)
 Subjective:    Patient ID: [name], male    DOB: 1930-01-07, 89 y.o.   MRN: [id number]  Chief Complaint  Patient presents with   Annual Exam    Patient presents today for a physical exam   Quality Metric Gaps    TDAP, zoster vaccines, foot exam    HPI Discussed the use of AI scribe software for clinical note transcription with the patient, who gave verbal consent to proceed.  History of Present Illness [name] is a 89 year old male who presents for a routine follow-up visit.  He is concerned about a new scab-like spot on his skin that appears to be regrowing. He recalls a previous incident where a small spot on his nose turned out to be cancerous, raising his concern about the current lesion.  He has experienced fluctuations in weight over the past year, currently weighing 144 pounds. He notes a decrease in appetite and an increased craving for sweets, which he attributes to aging. His diet is limited, often having a honey bun for breakfast, and he regularly drinks coffee. He dislikes eggs but tries to include protein in his diet.  He takes six stool softeners daily, three in the morning and three at night, along with a stool softener with a laxative, to manage bowel movements. No abdominal pain or new urinary symptoms. He reports occasional headaches.  He continues to take tramadol  for pain relief, with a prescription allowing for three doses per day. Occasionally, he runs out of medication before the refill is due, as he sometimes takes two doses at night. He experiences arthritis-related pain, particularly in the neck and shoulder area, which is exacerbated by changes in weather and prolonged driving. He uses heat and topical treatments for relief.  He snores heavily and has been observed to stop breathing briefly during sleep, but this has been a long-standing issue. He has not undergone a sleep study recently.  He is aware of the importance of hydration but admits  to not drinking water  consistently. He consumes ginger ale and orange drink regularly.    Past Medical History:  Diagnosis Date   Acute bronchitis 06/10/2015   Allergic rhinitis    Anemia    Asthma    Carotid stenosis    a. s/p Left CEA 2002;  b. Carotid US  3/16:  patent R CEA, L < 40%   Chronic pain syndrome    Left shoulder, back   Closed fracture of neck of right femur (HCC)    Diverticulosis    Dyslipidemia    Epigastric pain 04/05/2016   Esophageal stricture    Distal, benign   Femur fracture, left (HCC) 10/27/2018   GERD (gastroesophageal reflux disease)    Hemorrhoids    HTN (hypertension)    Negative renal duplex 07-22-11   Hx of echocardiogram    a. Echo 10/11: mod LVH, EF 55-60%   Pleural effusion 07/17/2020   Prostate cancer (HCC) 2000   Seed XRT   S/P thoracentesis    Spondylosis, cervical, with myelopathy 11/26/2015   Stroke (HCC) 08/1999   right thalamic - on chronic Plavix /ASA    Past Surgical History:  Procedure Laterality Date   ANTERIOR APPROACH HEMI HIP ARTHROPLASTY Right 03/15/2021   Procedure: ANTERIOR APPROACH HEMI HIP ARTHROPLASTY;  Surgeon: Fidel Rogue, MD;  Location: [facility] OR;  Service: Orthopedics;  Laterality: Right;   APPENDECTOMY  1953   CAROTID ENDARTERECTOMY Right 01/2000   CATARACT EXTRACTION Bilateral  CERVICAL DISC SURGERY  8/07   CHEST TUBE INSERTION Left 11/11/2020   Procedure: INSERTION PLEURAL DRAINAGE CATHETER;  Surgeon: Brenna Adine CROME, DO;  Location: [facility] ENDOSCOPY;  Service: Pulmonary;  Laterality: Left;  Indwelling Pleural Catheter   HIP ARTHROPLASTY Left 10/24/2018   Procedure: ARTHROPLASTY BIPOLAR HIP (HEMIARTHROPLASTY);  Surgeon: Ernie Cough, MD;  Location: [facility] OR;  Service: Orthopedics;  Laterality: Left;   INSERTION PROSTATE RADIATION SEED  2000   LUMBAR DISC SURGERY  1990s   NASAL SINUS SURGERY     x 3   REMOVAL OF PLEURAL DRAINAGE CATHETER N/A 12/24/2020   Procedure: MINOR REMOVAL OF PLEURAL DRAINAGE CATHETER;   Surgeon: Brenna Adine CROME, DO;  Location: [facility]COPY;  Service: Cardiopulmonary;  Laterality: N/A;   THORACENTESIS N/A 08/15/2020   Procedure: THORACENTESIS;  Surgeon: Brenna Adine CROME, DO;  Location: [facility] ENDOSCOPY;  Service: Pulmonary;  Laterality: N/A;    Family History  Problem Relation Age of Onset   Stroke Brother    Stroke Sister    Stroke Mother    Hyperlipidemia Mother    Hypertension Mother    Lung disease Father    Diabetes Son    Gout Son    Obesity Sister    Alcohol  abuse Brother    Cancer Brother        lung, smoker    Social History   Socioeconomic History   Marital status: Married    Spouse name: Not on file   Number of children: 1   Years of education: 10   Highest education level: Not on file  Occupational History   Occupation: Retired  Tobacco Use   Smoking status: Former    Current packs/day: 0.00    Average packs/day: 0.3 packs/day for 35.0 years (8.8 ttl pk-yrs)    Types: Cigarettes    Start date: 49    Quit date: 01/12/1978    Years since quitting: 45.6   Smokeless tobacco: Current    Types: Chew   Tobacco comments:    Started smoking at age 67  Vaping Use   Vaping status: Never Used  Substance and Sexual Activity   Alcohol  use: No    Comment: Prior alcoholic, sober since 2002   Drug use: No   Sexual activity: Not Currently  Other Topics Concern   Not on file  Social History Narrative   Retired lives at home w/ his wife   1 daughter   Right-handed    Caffeine: drinks a lot of coffee    Former smoker, former alcoholic abstinent for many years   Social Drivers of Corporate investment banker Strain: Low Risk  (06/30/2023)   Overall Financial Resource Strain (CARDIA)    Difficulty of Paying Living Expenses: Not very hard  Food Insecurity: No Food Insecurity (06/30/2023)   Hunger Vital Sign    Worried About Running Out of Food in the Last Year: Never true    Ran Out of Food in the Last Year: Never true  Transportation Needs: No  Transportation Needs (06/30/2023)   PRAPARE - Administrator, Civil Service (Medical): No    Lack of Transportation (Non-Medical): No  Physical Activity: Inactive (06/30/2023)   Exercise Vital Sign    Days of Exercise per Week: 0 days    Minutes of Exercise per Session: 0 min  Stress: No Stress Concern Present (06/30/2023)   [employer] of Occupational Health - Occupational Stress Questionnaire    Feeling of Stress: Not at all  Social  Connections: Socially Integrated (06/30/2023)   Social Connection and Isolation Panel    Frequency of Communication with Friends and Family: More than three times a week    Frequency of Social Gatherings with Friends and Family: More than three times a week    Attends Religious Services: More than 4 times per year    Active Member of [employer] or Organizations: Yes    Attends Engineer, structural: More than 4 times per year    Marital Status: Married  Catering manager Violence: Not At Risk (05/15/2021)   Humiliation, Afraid, Rape, and Kick questionnaire    Fear of Current or Ex-Partner: No    Emotionally Abused: No    Physically Abused: No    Sexually Abused: No    Outpatient Medications Prior to Visit  Medication Sig Dispense Refill   acetaminophen  (TYLENOL ) 500 MG tablet Take 1,000 mg by mouth every 6 (six) hours as needed for mild pain or headache.     albuterol  (VENTOLIN  HFA) 108 (90 Base) MCG/ACT inhaler Inhale 1-2 puffs into the lungs every 6 (six) hours as needed for wheezing or shortness of breath. 18 g 1   amLODipine  (NORVASC ) 5 MG tablet Take 2 tablets (10 mg total) by mouth daily (need office visit) 180 tablet 3   apixaban  (ELIQUIS ) 5 MG TABS tablet Take 1 tablet (5 mg) by mouth 2 times daily. 180 tablet 1   atorvastatin  (LIPITOR) 40 MG tablet Take 1 tablet (40 mg total) by mouth daily. 90 tablet 1   Cholecalciferol (VITAMIN D3 PO) Take 1 tablet by mouth in the morning. Unsure of how many units     docusate sodium  (COLACE)  100 MG capsule Take 300 mg by mouth 2 (two) times daily.     Ferrous Fumarate  (HEMOCYTE - 106 MG FE) 324 (106 Fe) MG TABS tablet Take 1 tablet (106 mg of iron total) by mouth 2 times daily. 30 tablet 2   furosemide  (LASIX ) 20 MG tablet Take 1 tablet (20 mg total) by mouth 2 (two) times a week. 30 tablet 3   furosemide  (LASIX ) 40 MG tablet 40 mg qday x 3 days. Then prn SOB/LE edema. 30 tablet 0   gabapentin  (NEURONTIN ) 300 MG capsule Take 1 capsule (300 mg total) by mouth 3 (three) times daily. 270 capsule 0   lisinopril  (ZESTRIL ) 5 MG tablet Take 1 tablet (5 mg total) by mouth daily. 90 tablet 1   metoCLOPramide  (REGLAN ) 5 MG tablet Take 1 tablet (5 mg total) by mouth at bedtime. Call for appointment for future refills. 30 tablet 3   minoxidil  (LONITEN ) 10 MG tablet Take 1 tablet (10 mg) by mouth 2 times daily. 180 tablet 3   omeprazole  (PRILOSEC) 40 MG capsule Take 1 capsule (40 mg total) by mouth daily as needed. 30 capsule 5   ondansetron  (ZOFRAN  ODT) 8 MG disintegrating tablet Dissolve 1 tablet by mouth every 8 hours as needed for nausea or vomiting. 20 tablet 0   polyvinyl alcohol  (LIQUIFILM TEARS) 1.4 % ophthalmic solution Place 1 drop into both eyes 5 (five) times daily as needed for dry eyes.     sennosides-docusate sodium  (SENOKOT-S) 8.6-50 MG tablet Take 1-2 tablets by mouth See admin instructions. Take 1 tablet in the morning & take 2 tablet at night     traMADol  (ULTRAM ) 50 MG tablet Take 1 tablet (50 mg) by mouth 3 times daily as needed. 90 tablet 0   triamcinolone  cream (KENALOG ) 0.1 % Apply topically to affected areas  of the skin daily as needed. 454 g 1   No facility-administered medications prior to visit.    Allergies  Allergen Reactions   Hctz [Hydrochlorothiazide] Shortness Of Breath, Swelling and Other (See Comments)    Swelling and dyspnea    Hydralazine  Shortness Of Breath and Other (See Comments)    Chest pain and GI issues also   Diovan [Valsartan] Other (See  Comments)    Elevated potassium- Hyperkalemia   Metformin And Related Other (See Comments)    Reaction not recalled   Nsaids Other (See Comments)    Other than Tylenol , he isn't to have these because he's on Eliquis    Other Other (See Comments)    Clonidine  B/P patch = Caused redness   Prednisone  Other (See Comments)    Suicidal thoughts   Spironolactone  Swelling    Site of swelling not recalled   Codeine Itching and Nausea Only   Oxycodone-Acetaminophen  Itching and Nausea Only    Review of Systems  Constitutional:  Negative for chills, fever and malaise/fatigue.  HENT:  Negative for congestion and hearing loss.   Eyes:  Negative for discharge.  Respiratory:  Negative for cough, sputum production and shortness of breath.   Cardiovascular:  Negative for chest pain, palpitations and leg swelling.  Gastrointestinal:  Negative for abdominal pain, blood in stool, constipation, diarrhea, heartburn, nausea and vomiting.  Genitourinary:  Negative for dysuria, frequency, hematuria and urgency.  Musculoskeletal:  Positive for myalgias and neck pain. Negative for back pain and falls.  Skin:  Negative for rash.  Neurological:  Negative for dizziness, sensory change, loss of consciousness, weakness and headaches.  Endo/Heme/Allergies:  Negative for environmental allergies. Does not bruise/bleed easily.  Psychiatric/Behavioral:  Negative for depression and suicidal ideas. The patient is not nervous/anxious and does not have insomnia.        Objective:    Physical Exam Vitals reviewed.  Constitutional:      General: He is not in acute distress.    Appearance: Normal appearance. He is not ill-appearing or diaphoretic.  HENT:     Head: Normocephalic and atraumatic.     Right Ear: Tympanic membrane, ear canal and external ear normal. There is no impacted cerumen.     Left Ear: Tympanic membrane, ear canal and external ear normal. There is no impacted cerumen.     Nose: Nose normal. No  rhinorrhea.     Mouth/Throat:     Pharynx: Oropharynx is clear.  Eyes:     General: No scleral icterus.    Extraocular Movements: Extraocular movements intact.     Conjunctiva/sclera: Conjunctivae normal.     Pupils: Pupils are equal, round, and reactive to light.  Neck:     Thyroid : No thyroid  mass or thyroid  tenderness.  Cardiovascular:     Rate and Rhythm: Normal rate and regular rhythm.     Pulses: Normal pulses.     Heart sounds: Normal heart sounds. No murmur heard. Pulmonary:     Effort: Pulmonary effort is normal.     Breath sounds: Normal breath sounds. No wheezing.  Abdominal:     General: Bowel sounds are normal.     Palpations: Abdomen is soft. There is no mass.     Tenderness: There is no guarding.  Musculoskeletal:        General: No swelling. Normal range of motion.     Cervical back: Normal range of motion and neck supple. No rigidity.     Right lower leg: No edema.  Left lower leg: No edema.  Lymphadenopathy:     Cervical: No cervical adenopathy.  Skin:    General: Skin is warm and dry.     Findings: No rash.  Neurological:     General: No focal deficit present.     Mental Status: He is alert and oriented to person, place, and time.     Cranial Nerves: No cranial nerve deficit.     Deep Tendon Reflexes: Reflexes normal.  Psychiatric:        Mood and Affect: Mood normal.        Behavior: Behavior normal.     BP (!) 130/52   Pulse 63   Resp 16   Ht 5' 11 (1.803 m)   Wt 144 lb 6.4 oz (65.5 kg)   SpO2 96%   BMI 20.14 kg/m  Wt Readings from Last 3 Encounters:  09/06/23 144 lb 6.4 oz (65.5 kg)  06/30/23 139 lb 12.8 oz (63.4 kg)  03/08/23 147 lb 3.2 oz (66.8 kg)    Diabetic Foot Exam - Simple   No data filed    Lab Results  Component Value Date   WBC 7.3 03/08/2023   HGB 10.9 (L) 03/08/2023   HCT 31.7 (L) 03/08/2023   PLT 336.0 03/08/2023   GLUCOSE 159 (H) 03/08/2023   CHOL 110 03/08/2023   TRIG 64.0 03/08/2023   HDL 50.00 03/08/2023    LDLCALC 47 03/08/2023   ALT 4 03/08/2023   AST 9 03/08/2023   NA 144 03/08/2023   K 4.2 03/08/2023   CL 108 03/08/2023   CREATININE 1.20 03/08/2023   BUN 22 03/08/2023   CO2 27 03/08/2023   TSH 3.43 03/08/2023   PSA 0.00 (L) 04/17/2021   INR 1.4 (H) 10/19/2021   HGBA1C 5.2 03/08/2023    Lab Results  Component Value Date   TSH 3.43 03/08/2023   Lab Results  Component Value Date   WBC 7.3 03/08/2023   HGB 10.9 (L) 03/08/2023   HCT 31.7 (L) 03/08/2023   MCV 92.7 03/08/2023   PLT 336.0 03/08/2023   Lab Results  Component Value Date   NA 144 03/08/2023   K 4.2 03/08/2023   CO2 27 03/08/2023   GLUCOSE 159 (H) 03/08/2023   BUN 22 03/08/2023   CREATININE 1.20 03/08/2023   BILITOT 0.5 03/08/2023   ALKPHOS 47 03/08/2023   AST 9 03/08/2023   ALT 4 03/08/2023   PROT 6.1 03/08/2023   ALBUMIN 3.6 03/08/2023   CALCIUM  8.4 03/08/2023   ANIONGAP 11 08/15/2022   EGFR 66 10/13/2022   GFR 52.85 (L) 03/08/2023   Lab Results  Component Value Date   CHOL 110 03/08/2023   Lab Results  Component Value Date   HDL 50.00 03/08/2023   Lab Results  Component Value Date   LDLCALC 47 03/08/2023   Lab Results  Component Value Date   TRIG 64.0 03/08/2023   Lab Results  Component Value Date   CHOLHDL 2 03/08/2023   Lab Results  Component Value Date   HGBA1C 5.2 03/08/2023       Assessment & Plan:  Controlled type 2 diabetes mellitus with hyperglycemia, without long-term current use of insulin  (HCC) Assessment & Plan: hgba1c acceptable, minimize simple carbs. Increase exercise as tolerated. Continue current meds   Orders: -     Lipid panel -     Hemoglobin A1c  Uncomplicated asthma, unspecified asthma severity, unspecified whether persistent Assessment & Plan: Albuterol  prn   Chronic diastolic congestive heart  failure [facility]) Assessment & Plan: No recent exacerbation  no changes   Chronic renal impairment, unspecified CKD stage Assessment & Plan: Hydrate and  monitor    Essential hypertension Assessment & Plan: Well controlled, no changes to meds. Encouraged heart healthy diet such as the DASH diet and exercise as tolerated.    Orders: -     Comprehensive metabolic panel with GFR -     CBC with Differential/Platelet -     TSH  PAF (paroxysmal atrial fibrillation) (HCC) Assessment & Plan: Asymptomatic, tolerating meds   Vitamin D  deficiency Assessment & Plan: Supplement and monitor   Orders: -     VITAMIN D  25 Hydroxy (Vit-D Deficiency, Fractures)  Preventative health care Assessment & Plan: Patient encouraged to maintain heart healthy diet, regular exercise, adequate sleep. Consider daily probiotics. Take medications as prescribed. Labs ordered and reviewed. Has aged out of Colonoscopy Has ACP documents in chart      Assessment and Plan Assessment & Plan Adult Wellness Visit Overall well-managed weight over the past year and a half. Appetite has decreased, likely due to aging. Increased craving for sweets, possibly due to decreased efficiency in energy metabolism. - Encouraged consumption of protein and healthy fats. - Discussed importance of hydration, especially in heat, and suggested drinking non-caffeinated, non-alcoholic fluids throughout the day. - Follow up in 6-7 months for routine wellness check.  Chronic pain Chronic pain managed with tramadol , but occasionally runs out before refill is due. Pain relief achieved with current regimen, but risk of increased dosage discussed due to age-related risks. - Try taking a full tramadol  in the morning, half in the middle of the day, and adjust evening dose as needed. - Consider splitting tramadol  doses to manage pain and avoid running out early.  Osteoarthritis of neck and shoulder region Osteoarthritis causing pain in the neck and shoulder region, exacerbated by weather changes and prolonged activities. - Apply moist heat to affected areas. - Use topical treatments like  lidocaine  or CBD cream. - Perform stretching exercises for neck and shoulder.  Constipation, chronic Chronic constipation managed with stool softeners and laxatives. Current regimen effective in maintaining regular bowel movements. - Continue current regimen of stool softeners and laxatives.  Dehydration, mild Mild dehydration likely due to insufficient fluid intake. - Increase intake of non-caffeinated, non-alcoholic fluids to 80 ounces per day.  Snoring Chronic snoring with occasional pauses in breathing. No significant changes reported, and no immediate concern for sleep apnea. - Consider sleep study if snoring pattern changes or if apnea is suspected.  Tobacco use, current (smokeless) Current use of smokeless tobacco. Discussed potential health impacts.  Goals of Care Discussed healthcare power of attorney and advanced directives. Scarlet is designated as the healthcare power of attorney. - Emphasized the importance of having a designated person to make medical decisions if unable to speak for himself.  Recording duration: 19 minutes     Harlene Horton, MD

## 2023-09-06 NOTE — Patient Instructions (Signed)
 Preventive Care 73 Years and Older, Male Preventive care refers to lifestyle choices and visits with your health care provider that can promote health and wellness. Preventive care visits are also called wellness exams. What can I expect for my preventive care visit? Counseling During your preventive care visit, your health care provider may ask about your: Medical history, including: Past medical problems. Family medical history. History of falls. Current health, including: Emotional well-being. Home life and relationship well-being. Sexual activity. Memory and ability to understand (cognition). Lifestyle, including: Alcohol, nicotine or tobacco, and drug use. Access to firearms. Diet, exercise, and sleep habits. Work and work Astronomer. Sunscreen use. Safety issues such as seatbelt and bike helmet use. Physical exam Your health care provider will check your: Height and weight. These may be used to calculate your BMI (body mass index). BMI is a measurement that tells if you are at a healthy weight. Waist circumference. This measures the distance around your waistline. This measurement also tells if you are at a healthy weight and may help predict your risk of certain diseases, such as type 2 diabetes and high blood pressure. Heart rate and blood pressure. Body temperature. Skin for abnormal spots. What immunizations do I need?  Vaccines are usually given at various ages, according to a schedule. Your health care provider will recommend vaccines for you based on your age, medical history, and lifestyle or other factors, such as travel or where you work. What tests do I need? Screening Your health care provider may recommend screening tests for certain conditions. This may include: Lipid and cholesterol levels. Diabetes screening. This is done by checking your blood sugar (glucose) after you have not eaten for a while (fasting). Hepatitis C test. Hepatitis B test. HIV (human  immunodeficiency virus) test. STI (sexually transmitted infection) testing, if you are at risk. Lung cancer screening. Colorectal cancer screening. Prostate cancer screening. Abdominal aortic aneurysm (AAA) screening. You may need this if you are a current or former smoker. Talk with your health care provider about your test results, treatment options, and if necessary, the need for more tests. Follow these instructions at home: Eating and drinking  Eat a diet that includes fresh fruits and vegetables, whole grains, lean protein, and low-fat dairy products. Limit your intake of foods with high amounts of sugar, saturated fats, and salt. Take vitamin and mineral supplements as recommended by your health care provider. Do not drink alcohol if your health care provider tells you not to drink. If you drink alcohol: Limit how much you have to 0-2 drinks a day. Know how much alcohol is in your drink. In the [place] one drink equals one 12 oz bottle of beer (355 mL), one 5 oz glass of wine (148 mL), or one 1 oz glass of hard liquor (44 mL). Lifestyle Brush your teeth every morning and night with fluoride toothpaste. Floss one time each day. Exercise for at least 30 minutes 5 or more days each week. Do not use any products that contain nicotine or tobacco. These products include cigarettes, chewing tobacco, and vaping devices, such as e-cigarettes. If you need help quitting, ask your health care provider. Do not use drugs. If you are sexually active, practice safe sex. Use a condom or other form of protection to prevent STIs. Take aspirin only as told by your health care provider. Make sure that you understand how much to take and what form to take. Work with your health care provider to find out whether it is safe  and beneficial for you to take aspirin daily. Ask your health care provider if you need to take a cholesterol-lowering medicine (statin). Find healthy ways to manage stress, such  as: Meditation, yoga, or listening to music. Journaling. Talking to a trusted person. Spending time with friends and family. Safety Always wear your seat belt while driving or riding in a vehicle. Do not drive: If you have been drinking alcohol. Do not ride with someone who has been drinking. When you are tired or distracted. While texting. If you have been using any mind-altering substances or drugs. Wear a helmet and other protective equipment during sports activities. If you have firearms in your house, make sure you follow all gun safety procedures. Minimize exposure to UV radiation to reduce your risk of skin cancer. What's next? Visit your health care provider once a year for an annual wellness visit. Ask your health care provider how often you should have your eyes and teeth checked. Stay up to date on all vaccines. This information is not intended to replace advice given to you by your health care provider. Make sure you discuss any questions you have with your health care provider. Document Revised: 06/26/2020 Document Reviewed: 06/26/2020 Elsevier Patient Education  2024 [employer].

## 2023-09-06 NOTE — Telephone Encounter (Signed)
 I called bms and he still has not met the 3%. She said his 3% is 1200 and he has only paid 606.83   Eliquis  lf 08/30/23 too soon until 11/06/23 to see what copay would be   Lmom for pt 09/06/23 on wife phone

## 2023-09-07 ENCOUNTER — Ambulatory Visit: Payer: Medicare HMO

## 2023-09-07 ENCOUNTER — Ambulatory Visit: Payer: Self-pay | Admitting: Family Medicine

## 2023-09-07 LAB — VITAMIN D 25 HYDROXY (VIT D DEFICIENCY, FRACTURES): VITD: 45.05 ng/mL (ref 30.00–100.00)

## 2023-09-07 LAB — CBC WITH DIFFERENTIAL/PLATELET
Basophils Absolute: 0.1 K/uL (ref 0.0–0.1)
Basophils Relative: 1 % (ref 0.0–3.0)
Eosinophils Absolute: 0.2 K/uL (ref 0.0–0.7)
Eosinophils Relative: 2.4 % (ref 0.0–5.0)
HCT: 31.2 % — ABNORMAL LOW (ref 39.0–52.0)
Hemoglobin: 10.8 g/dL — ABNORMAL LOW (ref 13.0–17.0)
Lymphocytes Relative: 16.3 % (ref 12.0–46.0)
Lymphs Abs: 1.1 K/uL (ref 0.7–4.0)
MCHC: 34.6 g/dL (ref 30.0–36.0)
MCV: 91.1 fl (ref 78.0–100.0)
Monocytes Absolute: 0.5 K/uL (ref 0.1–1.0)
Monocytes Relative: 6.9 % (ref 3.0–12.0)
Neutro Abs: 4.8 K/uL (ref 1.4–7.7)
Neutrophils Relative %: 73.4 % (ref 43.0–77.0)
Platelets: 282 K/uL (ref 150.0–400.0)
RBC: 3.42 Mil/uL — ABNORMAL LOW (ref 4.22–5.81)
RDW: 14.8 % (ref 11.5–15.5)
WBC: 6.6 K/uL (ref 4.0–10.5)

## 2023-09-07 LAB — LIPID PANEL
Cholesterol: 115 mg/dL (ref 0–200)
HDL: 55.2 mg/dL (ref >39.00)
LDL Cholesterol: 34 mg/dL (ref 0–99)
NonHDL: 60.11
Total CHOL/HDL Ratio: 2
Triglycerides: 129 mg/dL (ref 0.0–149.0)
VLDL: 25.8 mg/dL (ref 0.0–40.0)

## 2023-09-07 LAB — COMPREHENSIVE METABOLIC PANEL WITH GFR
ALT: 5 U/L (ref 0–53)
AST: 12 U/L (ref 0–37)
Albumin: 3.9 g/dL (ref 3.5–5.2)
Alkaline Phosphatase: 46 U/L (ref 39–117)
BUN: 24 mg/dL — ABNORMAL HIGH (ref 6–23)
CO2: 25 meq/L (ref 19–32)
Calcium: 8.4 mg/dL (ref 8.4–10.5)
Chloride: 109 meq/L (ref 96–112)
Creatinine, Ser: 1.23 mg/dL (ref 0.40–1.50)
GFR: 51.13 mL/min — ABNORMAL LOW (ref >60.00)
Glucose, Bld: 99 mg/dL (ref 70–99)
Potassium: 4.8 meq/L (ref 3.5–5.1)
Sodium: 143 meq/L (ref 135–145)
Total Bilirubin: 0.3 mg/dL (ref 0.2–1.2)
Total Protein: 6.1 g/dL (ref 6.0–8.3)

## 2023-09-07 LAB — TSH: TSH: 3.3 u[IU]/mL (ref 0.35–5.50)

## 2023-09-07 LAB — HEMOGLOBIN A1C: Hgb A1c MFr Bld: 5.7 % (ref 4.6–6.5)

## 2023-09-07 NOTE — Progress Notes (Unsigned)
 Cardiology Office Note    Date:  09/10/2023  ID:  [name], DOB 08/13/1929, MRN [id number] PCP:  Domenica Harlene LABOR, MD  Cardiologist:  Vina Gull, MD  Electrophysiologist:  None   Chief Complaint: Follow up for HTN   History of Present Illness: .    [name] is a 89 y.o. male with visit-pertinent history of hypertension, hyperlipidemia, GERD, reactive airway disease, esophageal stricture, paroxysmal atrial fibrillation, CVA, carotid disease s/p CEA in 2012  Patient was initially diagnosed with atrial fibrillation in the ER in 02/2018 in setting of pneumonia after presenting with symptoms of fever chills.  ECG indicated A-fib with RVR at 125 bpm.  Patient was started on metoprolol  and Eliquis .  Echocardiogram in 2020 indicated LVEF 60 to 65%, moderately increased LV wall thickness, G1 DD, RV systolic function and size was normal, trivial pericardial effusion, mild mitral annular calcification, no evidence of aortic valve stenosis.  Last echocardiogram on 08/29/2020 indicated LVEF 65 to 70%, no RWMA, G1 DD, RV systolic function and size was normal, mildly elevated PASP, no significant valvular abnormalities.  Patient was last seen in office on 11/30/2022 by [name], NP, he was euvolemic at office visit.  He remained stable from a cardiac standpoint.  Today he presents for follow up he reports that he has been doing well overall.  He denies any chest pain, shortness of breath, significant lower extremity edema, orthopnea or PND.  Patient reports that he takes Lasix  20 mg 3 times a week which he feels overall controls his lower extremity edema, notes that sometimes when he sits for prolonged period of time at the end of the day he will have some mild ankle edema that resolves overnight.  He denies any significant palpitations or feeling of being in atrial fibrillation.  He denies any bleeding problems on Eliquis .  His wife notes that he does sleep for what she feels like is a prolonged  period of time, patient feels that this is normal and denies any cardiac concerns or complaints at this time. ROS: .   Today he denies chest pain, shortness of breath, lower extremity edema, fatigue, palpitations, melena, hematuria, hemoptysis, diaphoresis, weakness, presyncope, syncope, orthopnea, and PND.  All other systems are reviewed and otherwise negative. Studies Reviewed: SABRA   EKG:  EKG is ordered today, personally reviewed, demonstrating  EKG Interpretation Date/Time:  Friday September 10 2023 11:15:20 EDT Ventricular Rate:  66 PR Interval:  206 QRS Duration:  112 QT Interval:  402 QTC Calculation: 421 R Axis:   -76  Text Interpretation: Normal sinus rhythm with sinus arrhythmia Left anterior fascicular block Anteroseptal infarct (cited on or before 15-Aug-2022) When compared with ECG of 15-Aug-2022 18:52, Sinus rhythm has replaced Atrial fibrillation T wave amplitude has increased in Anterior leads Confirmed by Erminie Foulks [phone]) on 09/10/2023 5:41:35 PM   CV Studies: Cardiac studies reviewed are outlined and summarized above. Otherwise please see EMR for full report. Cardiac Studies & Procedures   ______________________________________________________________________________________________     ECHOCARDIOGRAM  ECHOCARDIOGRAM LIMITED 08/29/2020  Narrative ECHOCARDIOGRAM LIMITED REPORT    Patient Name:   [name] Date of Exam: 08/29/2020 Medical Rec #:  [id number]     Height:       70.0 in Accession #:    [id number]    Weight:       143.8 lb Date of Birth:  [birth year]      BSA:          1.814  m Patient Age:    56 years      BP:           148/52 mmHg Patient Gender: M             HR:           72 bpm. Exam Location:  [address]  Procedure: Limited Echo, Cardiac Doppler, Limited Color Doppler and 3D Echo  Indications:    I50.32 CHF  History:        Patient has prior history of Echocardiogram examinations, most recent 09/08/2018. CHF, Stroke; Risk Factors:Former  Smoker, Hypertension and Dyslipidemia. Pleural Effusion status post Thoracentesis (x2).  Sonographer:    Waldo Guadalajara RCS Referring Phys: [id number] PAULA V ROSS  IMPRESSIONS   1. Left ventricular ejection fraction, by estimation, is 65 to 70%. The left ventricle has normal function. The left ventricle has no regional wall motion abnormalities. Left ventricular diastolic parameters are consistent with Grade I diastolic dysfunction (impaired relaxation). 2. Right ventricular systolic function is normal. The right ventricular size is normal. There is mildly elevated pulmonary artery systolic pressure. The estimated right ventricular systolic pressure is 43.0 mmHg. 3. Right atrial size was mildly dilated. 4. The mitral valve is grossly normal. No evidence of mitral valve regurgitation. No evidence of mitral stenosis. 5. The aortic valve is tricuspid. Aortic valve regurgitation is not visualized. No aortic stenosis is present. 6. The inferior vena cava is dilated in size with >50% respiratory variability, suggesting right atrial pressure of 8 mmHg.  Comparison(s): Changes from prior study are noted. EF unchanged. G1DD. Elevated RVSP~43 mmHG.  FINDINGS Left Ventricle: Left ventricular ejection fraction, by estimation, is 65 to 70%. The left ventricle has normal function. The left ventricle has no regional wall motion abnormalities. The left ventricular internal cavity size was normal in size. There is no left ventricular hypertrophy. Left ventricular diastolic parameters are consistent with Grade I diastolic dysfunction (impaired relaxation).  Right Ventricle: The right ventricular size is normal. No increase in right ventricular wall thickness. Right ventricular systolic function is normal. There is mildly elevated pulmonary artery systolic pressure. The tricuspid regurgitant velocity is 2.96 m/s, and with an assumed right atrial pressure of 8 mmHg, the estimated right ventricular systolic pressure  is 43.0 mmHg.  Left Atrium: Left atrial size was normal in size.  Right Atrium: Right atrial size was mildly dilated.  Pericardium: Trivial pericardial effusion is present. Presence of pericardial fat pad.  Mitral Valve: The mitral valve is grossly normal. No evidence of mitral valve stenosis.  Tricuspid Valve: The tricuspid valve is grossly normal. Tricuspid valve regurgitation is trivial. No evidence of tricuspid stenosis.  Aortic Valve: The aortic valve is tricuspid. Aortic valve regurgitation is not visualized. No aortic stenosis is present.  Pulmonic Valve: The pulmonic valve was grossly normal. Pulmonic valve regurgitation is not visualized. No evidence of pulmonic stenosis.  Aorta: The aortic root and ascending aorta are structurally normal, with no evidence of dilitation.  Venous: The right lower pulmonary vein is normal. The inferior vena cava is dilated in size with greater than 50% respiratory variability, suggesting right atrial pressure of 8 mmHg.  Additional Comments: There is a small pleural effusion in the left lateral region. LEFT VENTRICLE PLAX 2D LVIDd:         4.00 cm  Diastology LVIDs:         2.00 cm  LV e' medial:    6.96 cm/s LV PW:  1.20 cm  LV E/e' medial:  13.7 LV IVS:        1.10 cm  LV e' lateral:   6.96 cm/s LVOT diam:     2.20 cm  LV E/e' lateral: 13.7 LV SV:         114 LV SV Index:   63 LVOT Area:     3.80 cm  3D Volume EF: 3D EF:        70 % LV EDV:       188 ml LV ESV:       57 ml LV SV:        131 ml  RIGHT VENTRICLE RV S prime:     16.40 cm/s TAPSE (M-mode): 2.1 cm  LEFT ATRIUM             Index       RIGHT ATRIUM           Index LA diam:        2.80 cm 1.54 cm/m  RA Area:     24.40 cm LA Vol (A2C):   59.8 ml 32.96 ml/m RA Volume:   84.00 ml  46.30 ml/m LA Vol (A4C):   60.6 ml 33.40 ml/m LA Biplane Vol: 61.1 ml 33.68 ml/m AORTIC VALVE LVOT Vmax:   113.00 cm/s LVOT Vmean:  72.300 cm/s LVOT VTI:    0.299 m  AORTA Ao  Root diam: 3.40 cm Ao Asc diam:  3.30 cm  MITRAL VALVE                TRICUSPID VALVE MV Area (PHT): cm          TR Peak grad:   35.0 mmHg MV Decel Time: 285 msec     TR Vmax:        296.00 cm/s MV E velocity: 95.20 cm/s MV A velocity: 117.00 cm/s  SHUNTS MV E/A ratio:  0.81         Systemic VTI:  0.30 m Systemic Diam: 2.20 cm  Darryle Decent MD Electronically signed by Darryle Decent MD Signature Date/Time: 08/29/2020/7:26:07 PM    Final          ______________________________________________________________________________________________       Current Reported Medications:.    Current Meds  Medication Sig   acetaminophen  (TYLENOL ) 500 MG tablet Take 1,000 mg by mouth every 6 (six) hours as needed for mild pain or headache.   albuterol  (VENTOLIN  HFA) 108 (90 Base) MCG/ACT inhaler Inhale 1-2 puffs into the lungs every 6 (six) hours as needed for wheezing or shortness of breath.   amLODipine  (NORVASC ) 5 MG tablet Take 2 tablets (10 mg total) by mouth daily (need office visit)   apixaban  (ELIQUIS ) 5 MG TABS tablet Take 1 tablet (5 mg) by mouth 2 times daily.   atorvastatin  (LIPITOR) 40 MG tablet Take 1 tablet (40 mg total) by mouth daily.   Cholecalciferol (VITAMIN D3 PO) Take 1 tablet by mouth in the morning. Unsure of how many units   docusate sodium  (COLACE) 100 MG capsule Take 300 mg by mouth 2 (two) times daily.   Ferrous Fumarate  (HEMOCYTE - 106 MG FE) 324 (106 Fe) MG TABS tablet Take 1 tablet (106 mg of iron total) by mouth 2 times daily.   furosemide  (LASIX ) 20 MG tablet Take 1 tablet (20 mg total) by mouth 2 (two) times a week. (Patient taking differently: Take 20 mg by mouth 3 (three) times a week.)   gabapentin  (NEURONTIN ) 300 MG capsule Take  1 capsule (300 mg total) by mouth 3 (three) times daily.   lisinopril  (ZESTRIL ) 5 MG tablet Take 1 tablet (5 mg total) by mouth daily.   metoCLOPramide  (REGLAN ) 5 MG tablet Take 1 tablet (5 mg total) by mouth at bedtime. Call  for appointment for future refills.   minoxidil  (LONITEN ) 10 MG tablet Take 1 tablet (10 mg) by mouth 2 times daily.   omeprazole  (PRILOSEC) 40 MG capsule Take 1 capsule (40 mg total) by mouth daily as needed.   ondansetron  (ZOFRAN  ODT) 8 MG disintegrating tablet Dissolve 1 tablet by mouth every 8 hours as needed for nausea or vomiting.   polyvinyl alcohol  (LIQUIFILM TEARS) 1.4 % ophthalmic solution Place 1 drop into both eyes 5 (five) times daily as needed for dry eyes.   sennosides-docusate sodium  (SENOKOT-S) 8.6-50 MG tablet Take 1-2 tablets by mouth See admin instructions. Take 1 tablet in the morning & take 2 tablet at night   traMADol  (ULTRAM ) 50 MG tablet Take 1 tablet (50 mg) by mouth 3 times daily as needed.   triamcinolone  cream (KENALOG ) 0.1 % Apply topically to affected areas of the skin daily as needed.    Physical Exam:    VS:  BP 130/62   Pulse 66   Ht 5' 11 (1.803 m)   Wt 142 lb 9.6 oz (64.7 kg)   SpO2 95%   BMI 19.89 kg/m    Wt Readings from Last 3 Encounters:  09/10/23 142 lb 9.6 oz (64.7 kg)  09/06/23 144 lb 6.4 oz (65.5 kg)  06/30/23 139 lb 12.8 oz (63.4 kg)    GEN: Well nourished, well developed in no acute distress NECK: No JVD; No carotid bruits CARDIAC: RRR, no murmurs, rubs, gallops RESPIRATORY:  Clear to auscultation without rales, wheezing or rhonchi  ABDOMEN: Soft, non-tender, non-distended EXTREMITIES:  No edema; No acute deformity     Asessement and Plan:.    PAF: Patient with history of PAF, he denies any significant palpitations or feeling of atrial fibrillation or increased heart rates.  Patient denies any bleeding problems on Eliquis .  Labwork completed by his PCP on 09/06/2023 indicated stable hemoglobin and hematocrit, creatinine 1.23, patient weighs 64.7 kg, he does not meet requirements for decreased Eliquis  dosing.  Continue Eliquis  5 mg twice daily.  HFpEF: Patient notes a slight weight gain which he feels is related to increased appetite.   Echocardiogram in 08/2020 showed LVEF of 65 to 70%, G1 DD, mildly dilated right atria.  Patient today appears euvolemic and well compensated on exam.  Patient notes some intermittent increased lower extremity edema when sitting for prolonged periods, typically resolves overnight.  Continue Lasix  20 mg 3 times weekly and lisinopril  5 mg daily.  HTN: Blood pressure today 130/62.  Continue current antihypertensive regimen.  HLD: Last profile on 09/06/2023 indicated total cholesterol 115, HDL 55, triglycerides 129 and LDL 34.  Continue atorvastatin  40 mg daily.  Carotid artery disease: Patient denies any lightheadedness, presyncope or supine could be.  Underwent left CEA in 2002.  Carotid Dopplers in 11/2019 showed no significant carotid stenosis was less than 50% bilateral ICA.  Continue Eliquis  and statin.    Disposition: F/u with Dr. Okey 6 months or sooner if needed.  Signed, Elizbeth Posa D Jasmine Maceachern, NP

## 2023-09-08 ENCOUNTER — Other Ambulatory Visit (HOSPITAL_COMMUNITY): Payer: Self-pay

## 2023-09-10 ENCOUNTER — Ambulatory Visit: Attending: Cardiology | Admitting: Cardiology

## 2023-09-10 ENCOUNTER — Encounter: Payer: Self-pay | Admitting: Cardiology

## 2023-09-10 VITALS — BP 130/62 | HR 66 | Ht 71.0 in | Wt 142.6 lb

## 2023-09-10 DIAGNOSIS — I480 Paroxysmal atrial fibrillation: Secondary | ICD-10-CM

## 2023-09-10 DIAGNOSIS — I10 Essential (primary) hypertension: Secondary | ICD-10-CM

## 2023-09-10 DIAGNOSIS — I5032 Chronic diastolic (congestive) heart failure: Secondary | ICD-10-CM | POA: Diagnosis not present

## 2023-09-10 DIAGNOSIS — E782 Mixed hyperlipidemia: Secondary | ICD-10-CM | POA: Diagnosis not present

## 2023-09-10 DIAGNOSIS — I6523 Occlusion and stenosis of bilateral carotid arteries: Secondary | ICD-10-CM

## 2023-09-10 NOTE — Patient Instructions (Signed)
 Medication Instructions:  No changes *If you need a refill on your cardiac medications before your next appointment, please call your pharmacy*  Lab Work: No labs If you have labs (blood work) drawn today and your tests are completely normal, you will receive your results only by: MyChart Message (if you have MyChart) OR A paper copy in the mail If you have any lab test that is abnormal or we need to change your treatment, we will call you to review the results.  Testing/Procedures: No testing  Follow-Up: At [facility], you and your health needs are our priority.  As part of our continuing mission to provide you with exceptional heart care, our providers are all part of one team.  This team includes your primary Cardiologist (physician) and Advanced Practice Providers or APPs (Physician Assistants and Nurse Practitioners) who all work together to provide you with the care you need, when you need it.  Your next appointment:   6 month(s)  Provider:   Vina Gull, MD

## 2023-09-15 ENCOUNTER — Other Ambulatory Visit (HOSPITAL_COMMUNITY): Payer: Self-pay

## 2023-09-15 NOTE — Telephone Encounter (Signed)
 Patient Advocate Encounter   The patient was approved for a [employer] grant that will help cover the cost of eliquis  Total amount awarded, [phone].  Effective: 08/16/23 - 08/14/24   APW:[id number] ERW:[id number] Hmnle:[id number] PI:[id number] [employer] ID: [id number]   Pharmacy provided with approval and processing information. Patient informed via telephone/mychart

## 2023-09-19 ENCOUNTER — Other Ambulatory Visit (HOSPITAL_COMMUNITY): Payer: Self-pay

## 2023-09-21 ENCOUNTER — Other Ambulatory Visit (HOSPITAL_COMMUNITY): Payer: Self-pay

## 2023-09-21 ENCOUNTER — Other Ambulatory Visit: Payer: Self-pay | Admitting: Internal Medicine

## 2023-09-21 DIAGNOSIS — D0439 Carcinoma in situ of skin of other parts of face: Secondary | ICD-10-CM | POA: Diagnosis not present

## 2023-09-21 DIAGNOSIS — L821 Other seborrheic keratosis: Secondary | ICD-10-CM | POA: Diagnosis not present

## 2023-09-21 DIAGNOSIS — Z85828 Personal history of other malignant neoplasm of skin: Secondary | ICD-10-CM | POA: Diagnosis not present

## 2023-09-21 DIAGNOSIS — C44622 Squamous cell carcinoma of skin of right upper limb, including shoulder: Secondary | ICD-10-CM | POA: Diagnosis not present

## 2023-09-21 DIAGNOSIS — C44629 Squamous cell carcinoma of skin of left upper limb, including shoulder: Secondary | ICD-10-CM | POA: Diagnosis not present

## 2023-09-21 DIAGNOSIS — C44319 Basal cell carcinoma of skin of other parts of face: Secondary | ICD-10-CM | POA: Diagnosis not present

## 2023-09-23 ENCOUNTER — Other Ambulatory Visit (HOSPITAL_COMMUNITY): Payer: Self-pay

## 2023-09-23 MED ORDER — FUROSEMIDE 20 MG PO TABS
20.0000 mg | ORAL_TABLET | ORAL | 3 refills | Status: DC
  Filled 2023-09-23: qty 10, 35d supply, fill #0
  Filled 2023-10-24: qty 10, 35d supply, fill #1
  Filled 2023-12-06: qty 10, 35d supply, fill #2
  Filled 2024-01-13: qty 10, 35d supply, fill #3

## 2023-09-27 ENCOUNTER — Other Ambulatory Visit (HOSPITAL_COMMUNITY): Payer: Self-pay

## 2023-09-27 ENCOUNTER — Other Ambulatory Visit: Payer: Self-pay | Admitting: Family

## 2023-09-27 MED ORDER — FLUOROURACIL 5 % EX CREA
1.0000 | TOPICAL_CREAM | Freq: Two times a day (BID) | CUTANEOUS | 0 refills | Status: AC
  Filled 2023-09-27: qty 40, 28d supply, fill #0

## 2023-09-28 ENCOUNTER — Other Ambulatory Visit (HOSPITAL_COMMUNITY): Payer: Self-pay

## 2023-09-29 ENCOUNTER — Ambulatory Visit (HOSPITAL_BASED_OUTPATIENT_CLINIC_OR_DEPARTMENT_OTHER)
Admission: RE | Admit: 2023-09-29 | Discharge: 2023-09-29 | Disposition: A | Discharge status: Home / Self Care | Source: Ambulatory Visit | Attending: Family Medicine | Admitting: Family Medicine

## 2023-09-29 ENCOUNTER — Ambulatory Visit: Payer: Self-pay | Admitting: Family

## 2023-09-29 ENCOUNTER — Ambulatory Visit (INDEPENDENT_AMBULATORY_CARE_PROVIDER_SITE_OTHER): Admitting: Family Medicine

## 2023-09-29 ENCOUNTER — Ambulatory Visit: Payer: Self-pay

## 2023-09-29 ENCOUNTER — Encounter: Payer: Self-pay | Admitting: Family Medicine

## 2023-09-29 ENCOUNTER — Ambulatory Visit (HOSPITAL_BASED_OUTPATIENT_CLINIC_OR_DEPARTMENT_OTHER)
Admission: RE | Admit: 2023-09-29 | Discharge: 2023-09-29 | Disposition: A | Discharge status: Home / Self Care | Source: Ambulatory Visit | Attending: Family Medicine

## 2023-09-29 ENCOUNTER — Other Ambulatory Visit (HOSPITAL_COMMUNITY): Payer: Self-pay

## 2023-09-29 VITALS — BP 150/72 | HR 82 | Temp 98.3°F | Resp 16 | Ht 71.0 in | Wt 140.6 lb

## 2023-09-29 DIAGNOSIS — R051 Acute cough: Secondary | ICD-10-CM | POA: Insufficient documentation

## 2023-09-29 DIAGNOSIS — Q677 Pectus carinatum: Secondary | ICD-10-CM | POA: Diagnosis not present

## 2023-09-29 DIAGNOSIS — R062 Wheezing: Secondary | ICD-10-CM | POA: Diagnosis not present

## 2023-09-29 DIAGNOSIS — R109 Unspecified abdominal pain: Secondary | ICD-10-CM | POA: Diagnosis not present

## 2023-09-29 DIAGNOSIS — G8929 Other chronic pain: Secondary | ICD-10-CM | POA: Diagnosis not present

## 2023-09-29 DIAGNOSIS — R918 Other nonspecific abnormal finding of lung field: Secondary | ICD-10-CM | POA: Diagnosis not present

## 2023-09-29 DIAGNOSIS — J90 Pleural effusion, not elsewhere classified: Secondary | ICD-10-CM | POA: Diagnosis not present

## 2023-09-29 DIAGNOSIS — R1030 Lower abdominal pain, unspecified: Secondary | ICD-10-CM | POA: Diagnosis not present

## 2023-09-29 LAB — POC URINALSYSI DIPSTICK (AUTOMATED)
Bilirubin, UA: NEGATIVE
Glucose, UA: NEGATIVE
Ketones, UA: NEGATIVE
Leukocytes, UA: NEGATIVE
Nitrite, UA: NEGATIVE
Protein, UA: POSITIVE — AB
Spec Grav, UA: 1.01 (ref 1.010–1.025)
Urobilinogen, UA: 0.2 U/dL
pH, UA: 5 (ref 5.0–8.0)

## 2023-09-29 MED ORDER — TRAMADOL HCL 50 MG PO TABS
50.0000 mg | ORAL_TABLET | Freq: Three times a day (TID) | ORAL | 0 refills | Status: DC | PRN
  Filled 2023-09-29: qty 90, 30d supply, fill #0

## 2023-09-29 NOTE — Telephone Encounter (Signed)
 FYI Only or Action Required?: FYI only for provider.  Patient was last seen in primary care on 09/06/2023 by Domenica Harlene LABOR, MD.  Called Nurse Triage reporting Abdominal Pain.  Symptoms began several days ago.  Interventions attempted: Nothing.  Symptoms are: unchanged.  Triage Disposition: See HCP Within 4 Hours (Or PCP Triage)  Patient/caregiver understands and will follow disposition?: Yes    Copied from CRM [id number]. Topic: Clinical - Red Word Triage >> Sep 29, 2023  2:51 PM Pinkey ORN wrote: Red Word that prompted transfer to Nurse Triage: Severe Pain >> Sep 29, 2023  2:52 PM Pinkey ORN wrote: [place] Wife states the patient is experiencing severe pain in his stomach. States it's his upper intestine and this has been going on for 2-3 days.  Reason for Disposition  [1] MILD-MODERATE pain AND [2] constant AND [3] present > 2 hours  Answer Assessment - Initial Assessment Questions 1. LOCATION: Where does it hurt?      Upper abd pain all the way across 2. RADIATION: Does the pain shoot anywhere else? (e.g., chest, back)     When he breaths it hurts 3. ONSET: When did the pain begin? (Minutes, hours or days ago)      Three days ago 4. SUDDEN: Gradual or sudden onset?     gradually 5. PATTERN Does the pain come and go, or is it constant?     Comes and goes 6. SEVERITY: How bad is the pain?  (e.g., Scale 1-10; mild, moderate, or severe)     Mild to moderate 7. RECURRENT SYMPTOM: Have you ever had this type of stomach pain before? If Yes, ask: When was the last time? and What happened that time?      denies 8. CAUSE: What do you think is causing the stomach pain? (e.g., gallstones, recent abdominal surgery)     denies 9. RELIEVING/AGGRAVATING FACTORS: What makes it better or worse? (e.g., antacids, bending or twisting motion, bowel movement)     breathing 10. OTHER SYMPTOMS: Do you have any other symptoms? (e.g., back pain, diarrhea, fever,  urination pain, vomiting)       denies  Protocols used: Abdominal Pain - Male-A-AH

## 2023-09-29 NOTE — Progress Notes (Signed)
 Acute Office Visit  Subjective:     Patient ID: [name], male    DOB: 07/28/1929, 89 y.o.   MRN: [id number]  Chief Complaint  Patient presents with   Abdominal Pain    X3 days, lower left, some nausea, no vomiting, no constipation or diarrhea      Patient is in today for abdominal pain.  Discussed the use of AI scribe software for clinical note transcription with the patient, who gave verbal consent to proceed.  History of Present Illness [name] is a 89 year old male who presents with abdominal pain and cramping.  He has been experiencing abdominal pain for the past three days, described as cramping and sore, particularly in the lower middle abdomen. The pain worsens with breathing and is constant, rated as severe. Resting slightly alleviates the pain, but no other activities or dietary changes have influenced it. No nausea, vomiting, diarrhea, fever, heartburn, or changes in appetite. Bowel movements are regular, with normal consistency and no blood. He has not experienced any recent heavy lifting or falls.  He has been off tramadol  for three days, which he has been taking three times daily for a few years. He also takes Neurontin  at night. He experienced a severe coughing spell last night, which he believes may have contributed to his abdominal pain.  He has a history of prostate issues, and his urine sample showed some blood, consistent with past findings. He denies any significant changes in urination, though urination slightly alleviates the pain.     All review of systems negative except what is listed in the HPI      Objective:    BP (!) 150/72   Pulse 82   Temp 98.3 F (36.8 C) (Oral)   Resp 16   Ht 5' 11 (1.803 m)   Wt 140 lb 9.6 oz (63.8 kg)   SpO2 98%   BMI 19.61 kg/m    Physical Exam Vitals reviewed.  Constitutional:      Appearance: He is well-developed.  Cardiovascular:     Rate and Rhythm: Normal rate.  Pulmonary:     Effort:  Pulmonary effort is normal. No respiratory distress.     Breath sounds: Wheezing and rhonchi present.  Abdominal:     General: Abdomen is flat. Bowel sounds are normal.     Palpations: Abdomen is soft.     Tenderness: There is abdominal tenderness in the suprapubic area.  Skin:    General: Skin is warm and dry.  Neurological:     Mental Status: He is alert and oriented to person, place, and time.  Psychiatric:        Mood and Affect: Mood normal.        Behavior: Behavior normal.        Results for orders placed or performed in visit on 09/29/23  POCT Urinalysis Dipstick (Automated)  Result Value Ref Range   Color, UA yellow    Clarity, UA clear    Glucose, UA Negative Negative   Bilirubin, UA neg    Ketones, UA neg    Spec Grav, UA 1.010 1.010 - 1.025   Blood, UA moderate    pH, UA 5.0 5.0 - 8.0   Protein, UA Positive (A) Negative   Urobilinogen, UA 0.2 0.2 or 1.0 E.U./dL   Nitrite, UA neg    Leukocytes, UA Negative Negative        Assessment & Plan:   Problem List Items Addressed This  Visit       Active Problems   Chronic pain   Relevant Medications   traMADol  (ULTRAM ) 50 MG tablet   Other Visit Diagnoses       Abdominal pain, unspecified abdominal location    -  Primary   Relevant Orders   POCT Urinalysis Dipstick (Automated) (Completed)   Urine Culture   DG Abd 1 View     Acute cough       Relevant Orders   DG Chest 2 View       Assessment & Plan Differential includes muscle strain from recent coughing fits, constipation or other abdominal pathology. Chronic hematuria noted, not indicative of new issue. Will send sample for culture. Overall he is stable on exam today. - Order chest x-ray for new cough with abnormal lung sounds today, primarily upper lobes. - Order abdominal x-ray for constipation or other pathology. - Consider CT abdomen if pain persists and x-rays inconclusive. - Advise monitoring for severe symptoms and hospital visit  worsening. - Refill tramadol  for pain management.  Chronic back pain Exacerbated by recent tramadol  discontinuation. - Refill tramadol  for chronic back pain.   Unable to do labs today, lab closed. Recent labs stable 3 weeks ago; can consider adding future labs pending results and symptom progression.     Meds ordered this encounter  Medications   traMADol  (ULTRAM ) 50 MG tablet    Sig: Take 1 tablet (50 mg) by mouth 3 times daily as needed.    Dispense:  90 tablet    Refill:  0    Supervising Provider:   DOMENICA BLACKBIRD A [[id number]]    Return if symptoms worsen or fail to improve.  Waddell KATHEE Mon, NP

## 2023-09-30 LAB — URINE CULTURE
MICRO NUMBER:: 16980176
SPECIMEN QUALITY:: ADEQUATE

## 2023-09-30 NOTE — Progress Notes (Signed)
 Called patient and no answer left vm to return call

## 2023-10-01 ENCOUNTER — Telehealth: Payer: Self-pay

## 2023-10-01 NOTE — Telephone Encounter (Signed)
 Copied from CRM [id number]. Topic: Clinical - Lab/Test Results >> Sep 30, 2023  4:03 PM Thersia BROCKS wrote: Reason for CRM: Patient wife called in regarding patients imaging results, would like a callback regarding this

## 2023-10-01 NOTE — Telephone Encounter (Signed)
 Patient and wife was advised via another encounter.

## 2023-10-01 NOTE — Telephone Encounter (Signed)
 Returned pts wife call and no answer left vm to return call.

## 2023-10-01 NOTE — Progress Notes (Signed)
Patient's wife was advised and verbalized understanding.

## 2023-10-04 ENCOUNTER — Other Ambulatory Visit (HOSPITAL_COMMUNITY): Payer: Self-pay

## 2023-10-04 IMAGING — DX DG FEMUR 2+V PORT*L*
4 series · 4 of 4 positions shown · non-contrast
Comparison: 04/28/2019.

CLINICAL DATA: 89-year-old male with fall. Left hip arthroplasty.

EXAM:
LEFT FEMUR PORTABLE 2 VIEWS

[femur ap (1 of 2)]
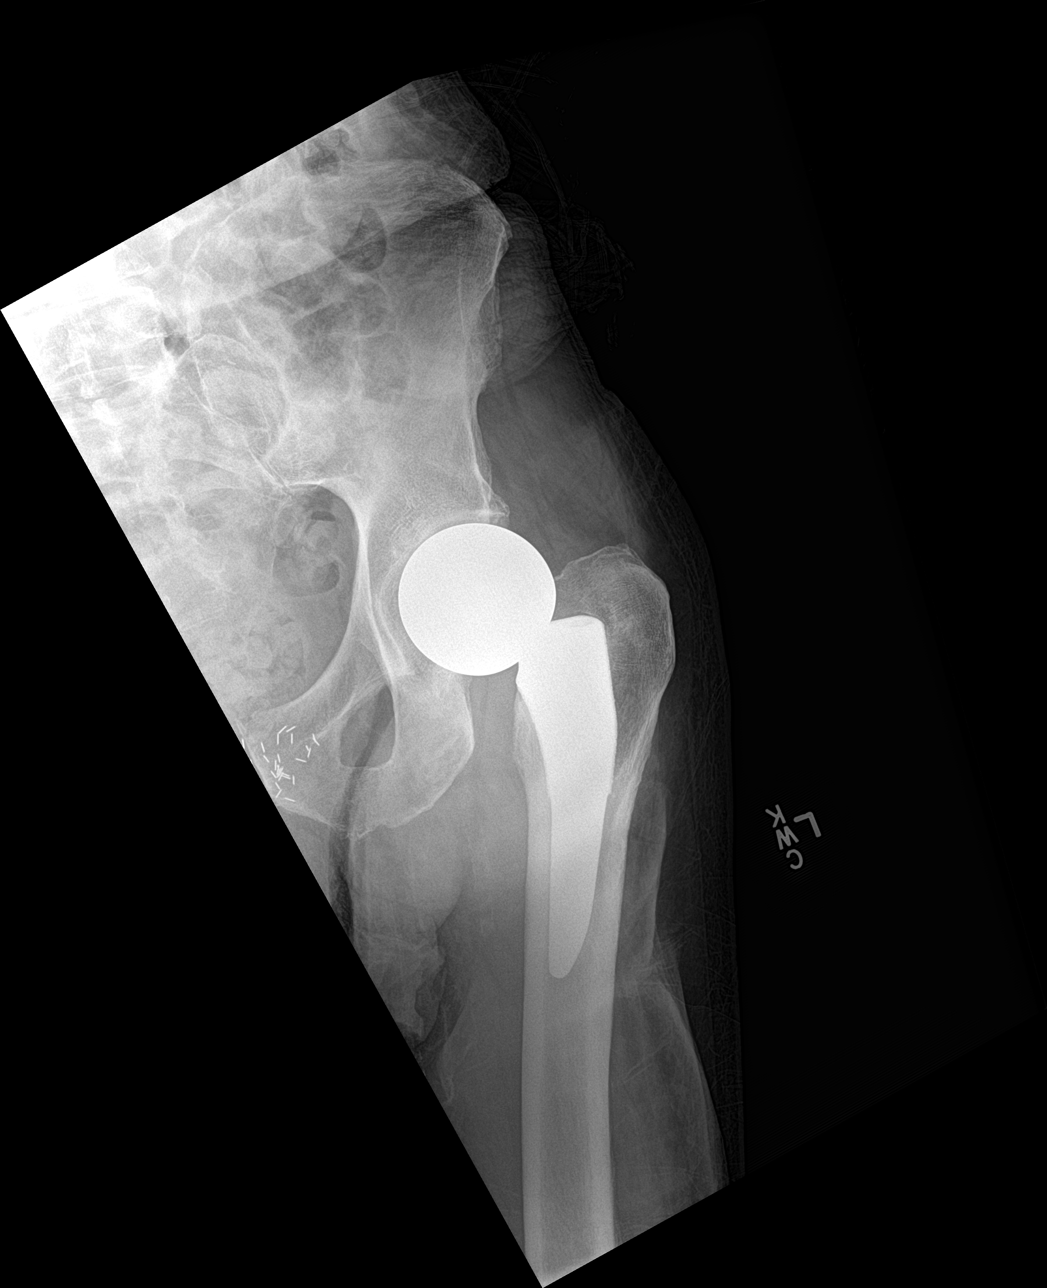

[femur ap (2 of 2)]
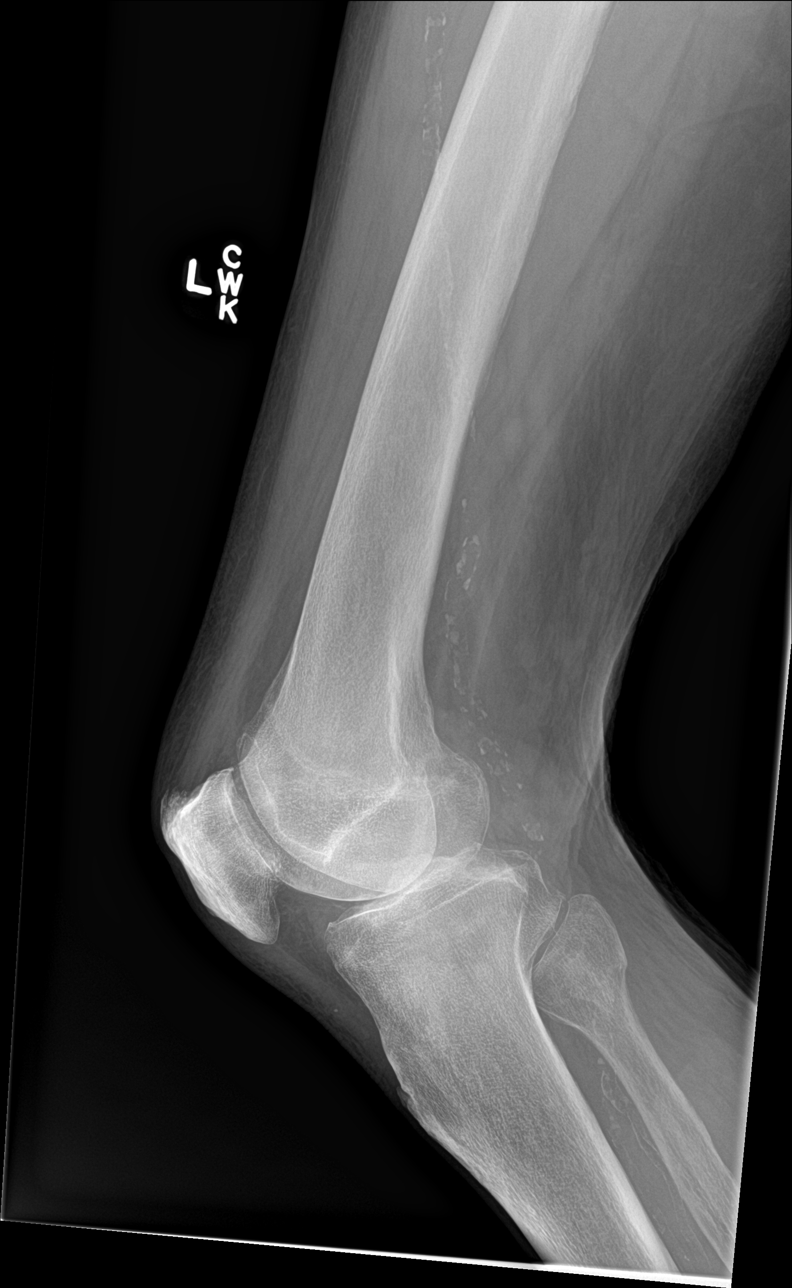

[femur lat (1 of 2)]
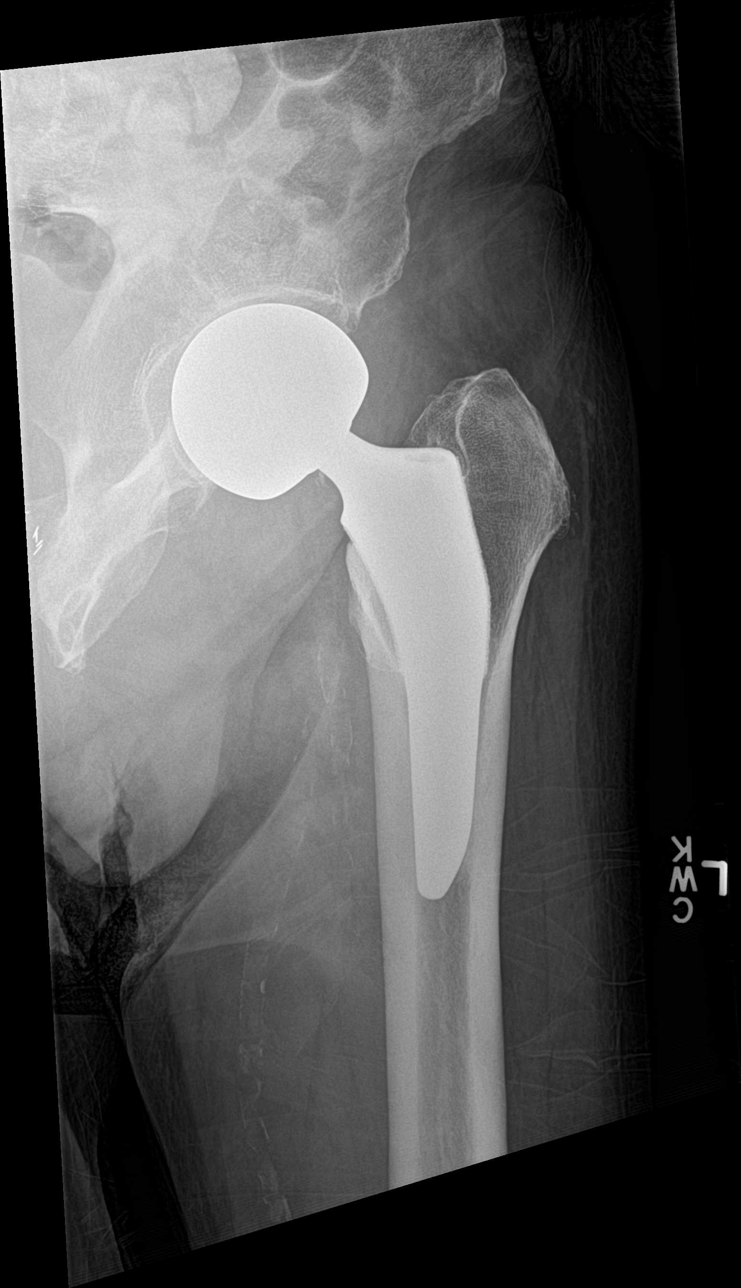

[femur lat (2 of 2)]
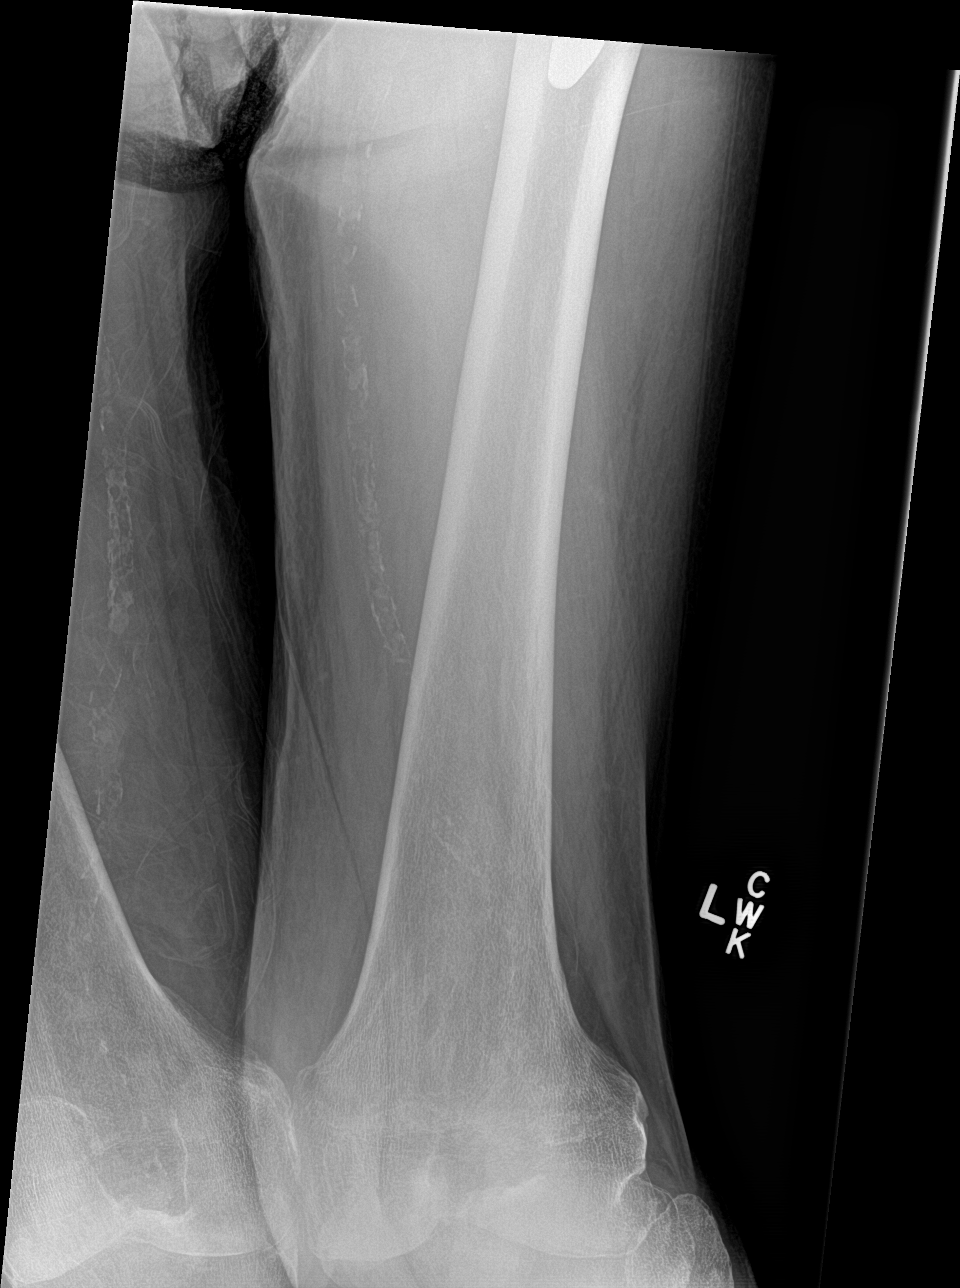

[4 of 4 positions shown; findings below may reference images not displayed]

FINDINGS: Chronic proximal left femoral arthroplasty. Femoral head component
remains normally aligned. Hardware appears intact. Visible left
hemipelvis appears intact. Chronic prostate brachytherapy. Proximal
left femur appears stable and intact. Left femoral shaft intact.
Distal left femur intact. Preserved alignment at the left knee. No
evidence of knee joint effusion. Multi compartment knee joint space
loss. Calcified peripheral vascular disease. No acute osseous
abnormality identified.
IMPRESSION: 1. No acute fracture or dislocation identified about the left femur.
2. Stable chronic proximal left femoral arthroplasty.

## 2023-10-04 IMAGING — DX DG PORTABLE PELVIS
1 series · 2 of 2 positions shown · non-contrast
Comparison: None.

CLINICAL DATA: Postop right hip replacement

EXAM:
PORTABLE PELVIS 1-2 VIEWS

[Series 1: pelvis · 0.14mm/px · 2 of 2 slices shown]
[im 1/2]
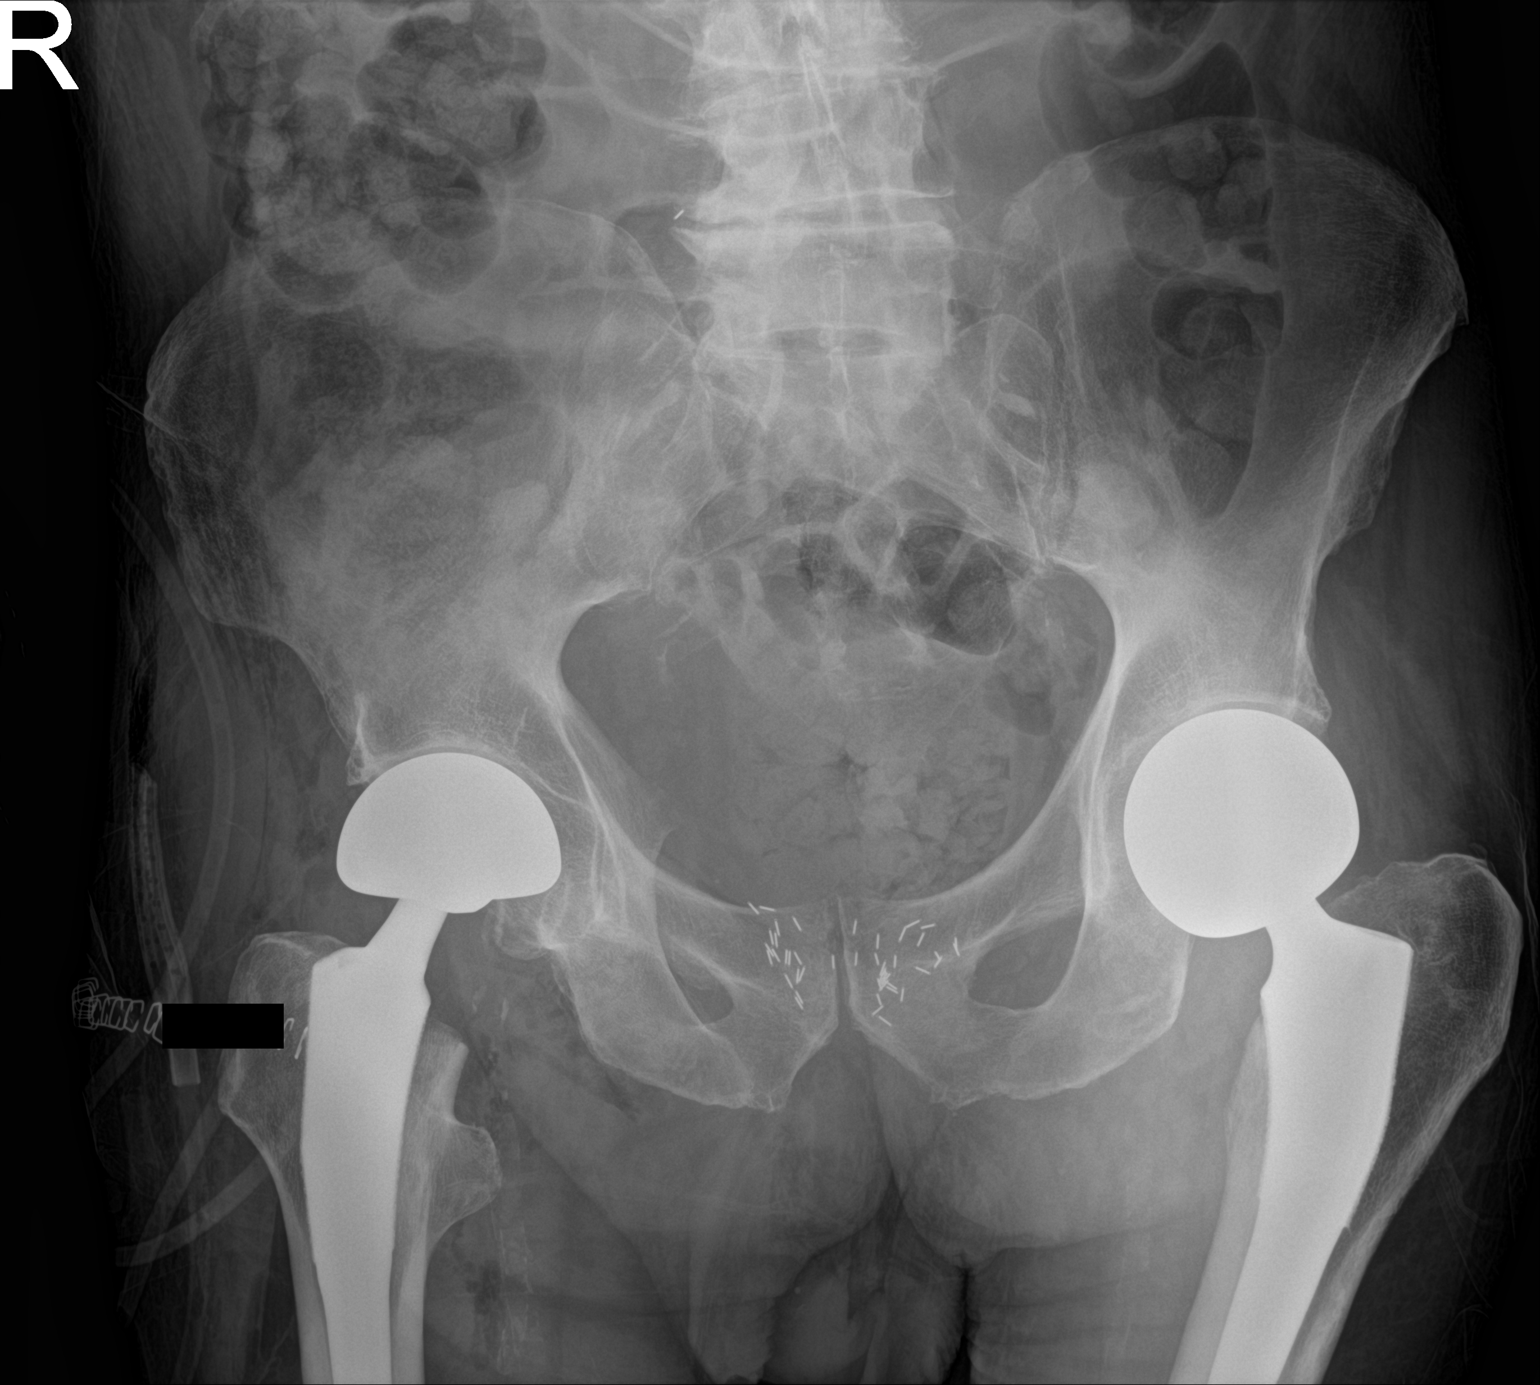
[im 2/2]
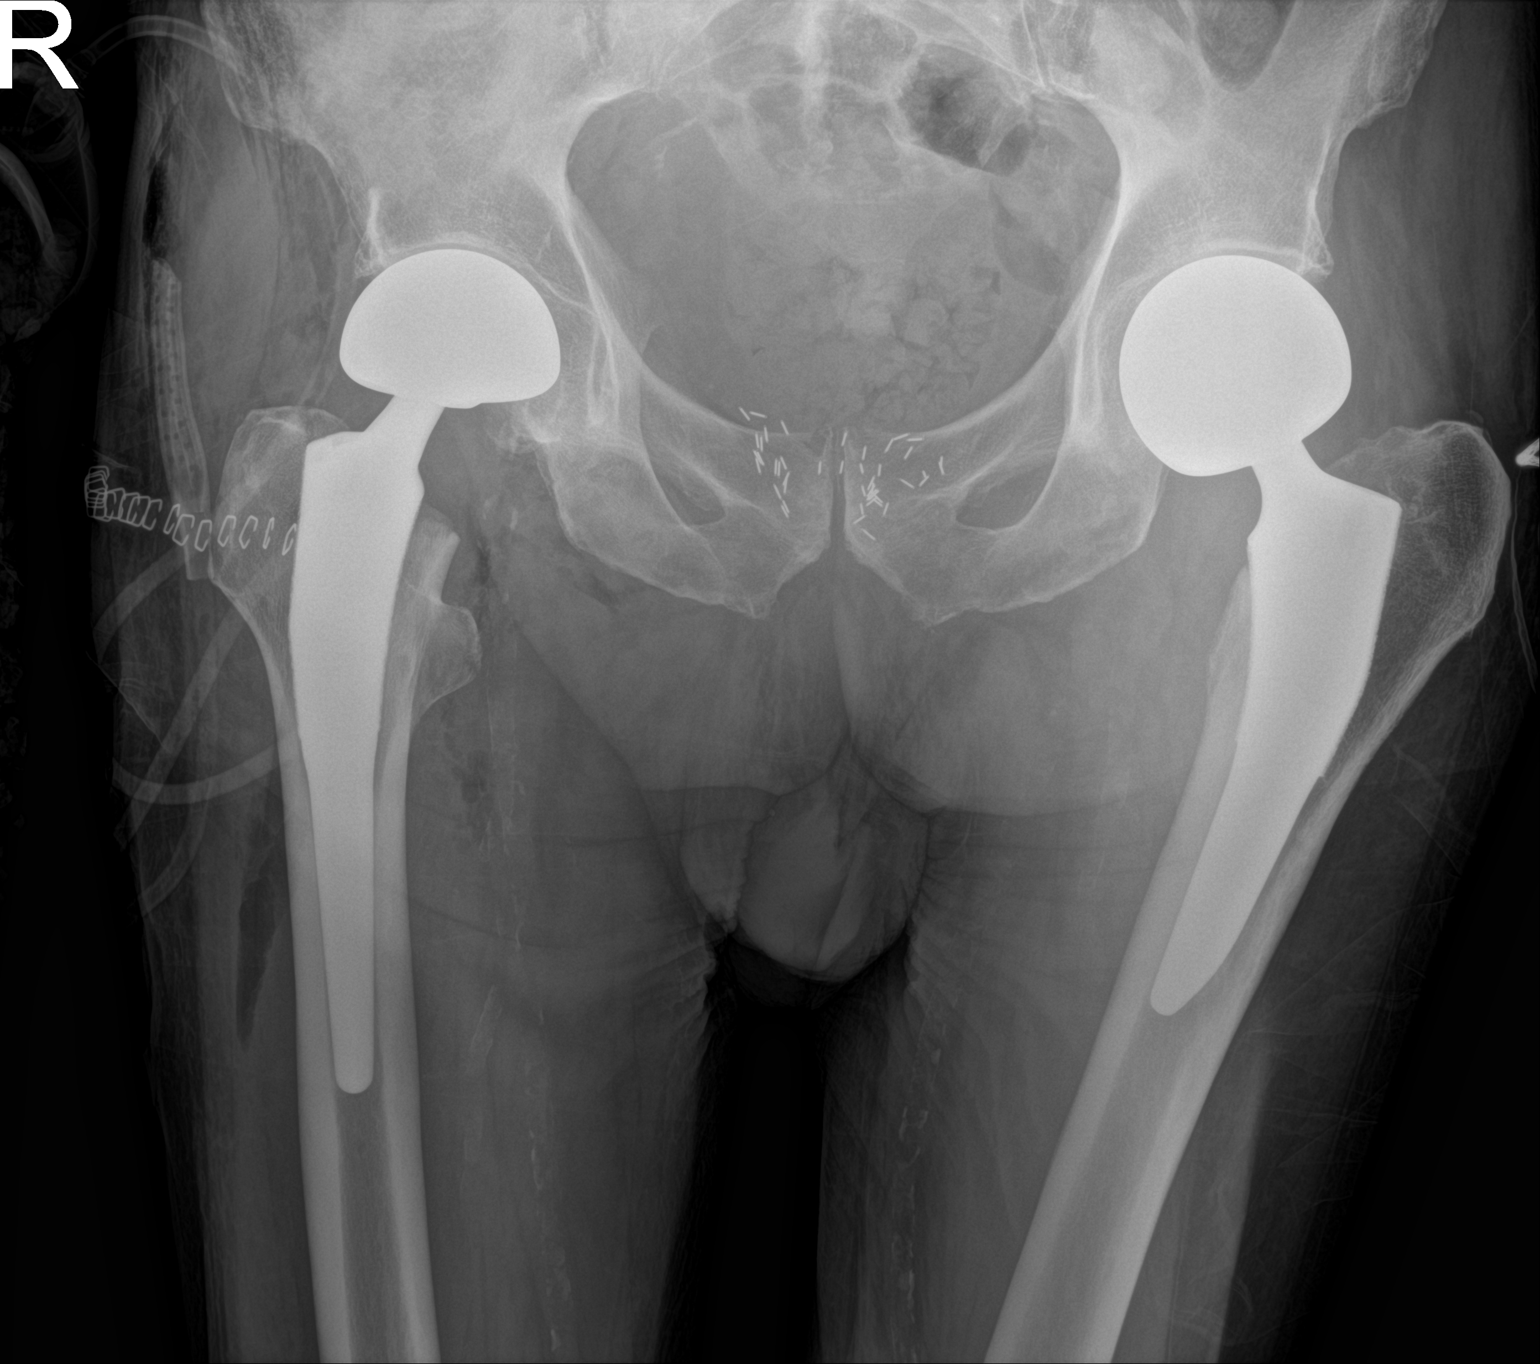

[2 of 2 positions shown; findings below may reference images not displayed]

FINDINGS: Interval right total hip arthroplasty without hardware failure or
complication. Normal alignment. Postsurgical changes in the
surrounding soft tissues.

Prior left hip arthroplasty without hardware failure or
complication.

Generalized osteopenia.

Prostatic radiation seeds noted. Peripheral vascular atherosclerotic
disease.
IMPRESSION: 1. Interval right total hip arthroplasty.

## 2023-10-05 ENCOUNTER — Other Ambulatory Visit: Payer: Self-pay | Admitting: Family Medicine

## 2023-10-05 ENCOUNTER — Other Ambulatory Visit (HOSPITAL_COMMUNITY): Payer: Self-pay

## 2023-10-05 DIAGNOSIS — I6350 Cerebral infarction due to unspecified occlusion or stenosis of unspecified cerebral artery: Secondary | ICD-10-CM

## 2023-10-05 DIAGNOSIS — E782 Mixed hyperlipidemia: Secondary | ICD-10-CM

## 2023-10-05 DIAGNOSIS — I10 Essential (primary) hypertension: Secondary | ICD-10-CM

## 2023-10-05 DIAGNOSIS — E559 Vitamin D deficiency, unspecified: Secondary | ICD-10-CM

## 2023-10-05 MED ORDER — ATORVASTATIN CALCIUM 40 MG PO TABS
40.0000 mg | ORAL_TABLET | Freq: Every day | ORAL | 1 refills | Status: AC
  Filled 2023-10-05: qty 90, 90d supply, fill #0
  Filled 2023-10-06 – 2024-01-07 (×4): qty 90, 90d supply, fill #1

## 2023-10-06 ENCOUNTER — Other Ambulatory Visit (HOSPITAL_COMMUNITY): Payer: Self-pay

## 2023-10-07 ENCOUNTER — Other Ambulatory Visit: Payer: Self-pay | Admitting: Family Medicine

## 2023-10-07 DIAGNOSIS — R109 Unspecified abdominal pain: Secondary | ICD-10-CM

## 2023-10-07 IMAGING — DX DG CHEST 1V PORT
1 series · 1 of 1 positions shown · non-contrast
Comparison: 03/14/2021

CLINICAL DATA: Fever postop

EXAM:
PORTABLE CHEST 1 VIEW

[chest ap]
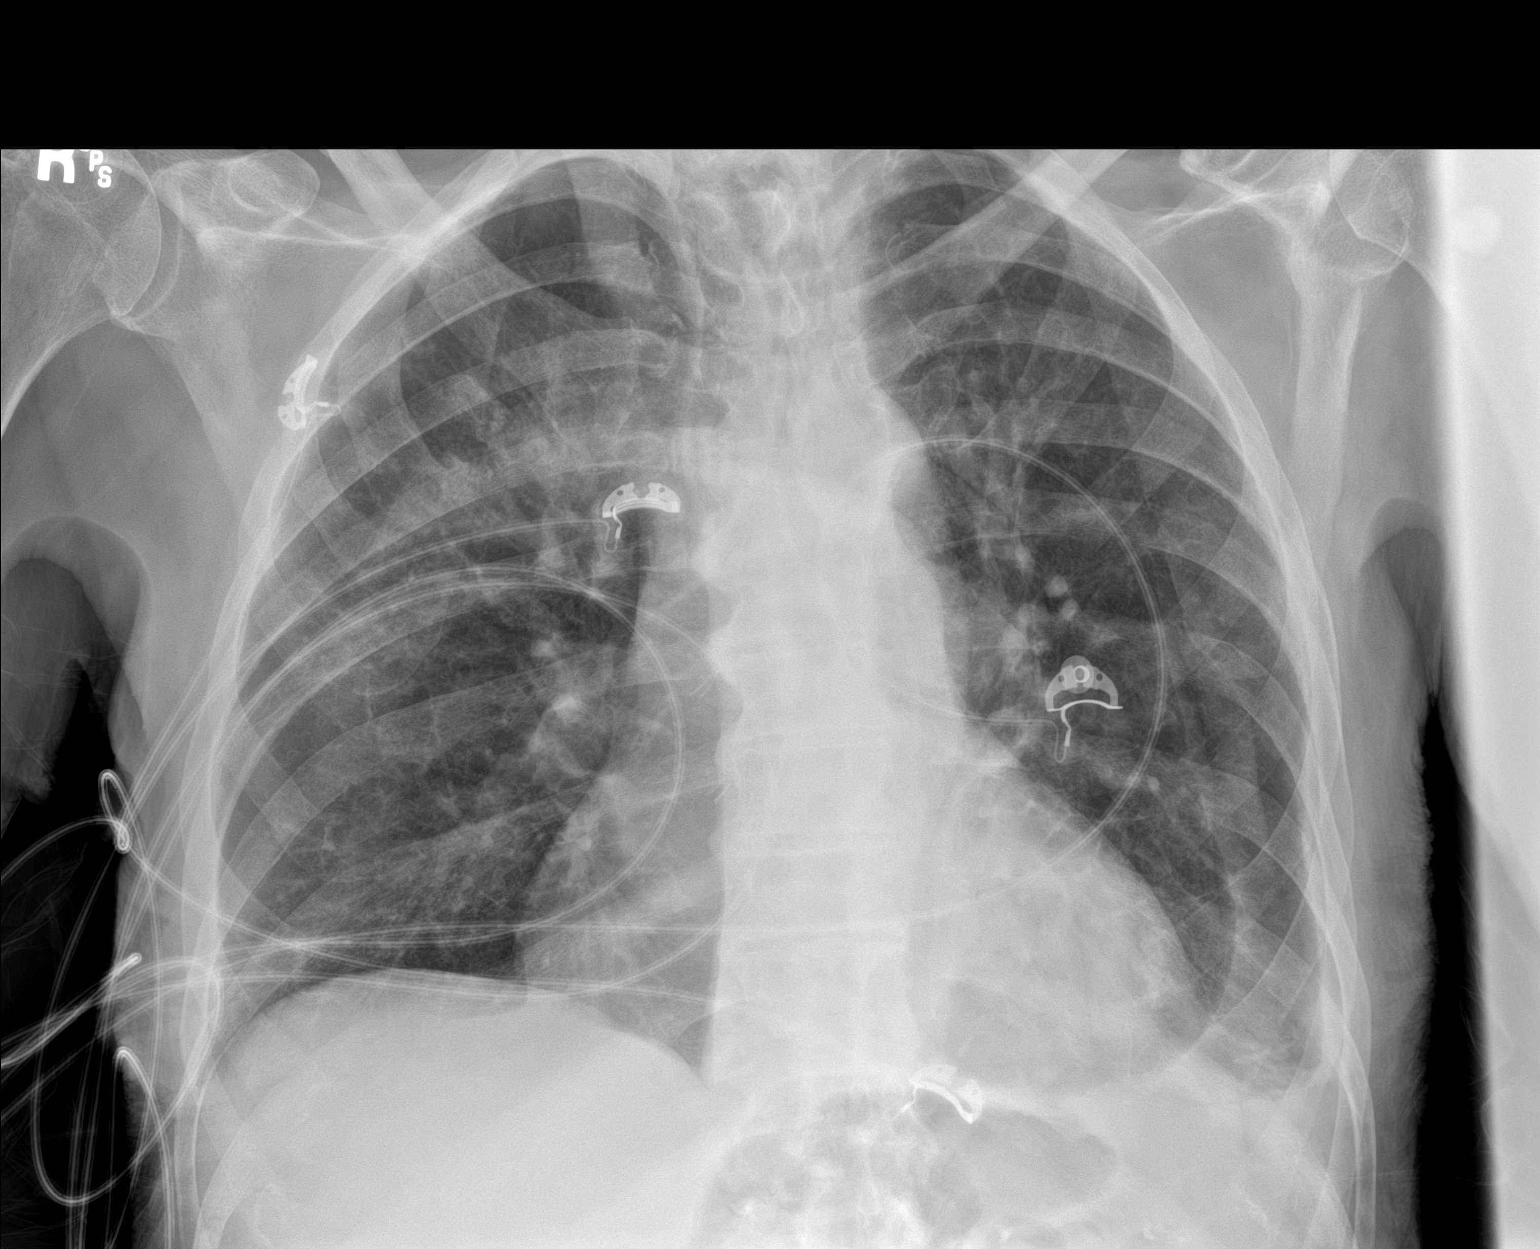

[1 of 1 positions shown; findings below may reference images not displayed]

FINDINGS: Stable large cardiac silhouette. Small LEFT effusion new from prior.
Mild airspace disease in the RIGHT upper lobe increased from prior.
No pneumothorax.
IMPRESSION: 1. Increased mild RIGHT upper lobe airspace disease representing
asymmetric edema versus pneumonia or aspiration pneumonitis.
2. Small LEFT effusion.

## 2023-10-18 ENCOUNTER — Other Ambulatory Visit: Payer: Self-pay | Admitting: Internal Medicine

## 2023-10-20 ENCOUNTER — Other Ambulatory Visit (HOSPITAL_COMMUNITY): Payer: Self-pay

## 2023-10-20 MED ORDER — AMLODIPINE BESYLATE 5 MG PO TABS
10.0000 mg | ORAL_TABLET | Freq: Every day | ORAL | 3 refills | Status: AC
  Filled 2023-10-20: qty 180, 90d supply, fill #0
  Filled 2024-01-27: qty 180, 90d supply, fill #1

## 2023-10-25 ENCOUNTER — Other Ambulatory Visit (HOSPITAL_COMMUNITY): Payer: Self-pay

## 2023-10-26 ENCOUNTER — Other Ambulatory Visit: Payer: Self-pay | Admitting: Family Medicine

## 2023-10-26 DIAGNOSIS — I10 Essential (primary) hypertension: Secondary | ICD-10-CM

## 2023-10-27 ENCOUNTER — Other Ambulatory Visit (HOSPITAL_COMMUNITY): Payer: Self-pay

## 2023-10-27 ENCOUNTER — Other Ambulatory Visit: Payer: Self-pay

## 2023-10-27 ENCOUNTER — Other Ambulatory Visit: Payer: Self-pay | Admitting: Family Medicine

## 2023-10-27 DIAGNOSIS — G8929 Other chronic pain: Secondary | ICD-10-CM

## 2023-10-27 MED ORDER — TRAMADOL HCL 50 MG PO TABS
50.0000 mg | ORAL_TABLET | Freq: Three times a day (TID) | ORAL | 0 refills | Status: DC | PRN
  Filled 2023-10-27 – 2023-10-28 (×2): qty 90, 30d supply, fill #0

## 2023-10-27 MED ORDER — LISINOPRIL 5 MG PO TABS
5.0000 mg | ORAL_TABLET | Freq: Every day | ORAL | 1 refills | Status: AC
  Filled 2023-10-27: qty 90, 90d supply, fill #0
  Filled 2024-02-03 – 2024-02-08 (×2): qty 90, 90d supply, fill #1

## 2023-10-27 NOTE — Telephone Encounter (Signed)
 Requesting: tramadol  50mg   Contract:03/08/23 UDS:03/08/23 Last Visit: 09/29/23 w/ Waddell Next Visit: 03/09/24 Last Refill: 09/29/23 #90 and 0RF   Please Advise

## 2023-10-28 ENCOUNTER — Other Ambulatory Visit (HOSPITAL_COMMUNITY): Payer: Self-pay

## 2023-10-28 ENCOUNTER — Other Ambulatory Visit: Payer: Self-pay

## 2023-11-03 ENCOUNTER — Other Ambulatory Visit (HOSPITAL_COMMUNITY): Payer: Self-pay

## 2023-11-03 DIAGNOSIS — Z85828 Personal history of other malignant neoplasm of skin: Secondary | ICD-10-CM | POA: Diagnosis not present

## 2023-11-03 DIAGNOSIS — C44319 Basal cell carcinoma of skin of other parts of face: Secondary | ICD-10-CM | POA: Diagnosis not present

## 2023-11-03 MED ORDER — DOXYCYCLINE HYCLATE 100 MG PO CAPS
100.0000 mg | ORAL_CAPSULE | Freq: Two times a day (BID) | ORAL | 0 refills | Status: AC
  Filled 2023-11-03: qty 10, 5d supply, fill #0

## 2023-11-19 ENCOUNTER — Other Ambulatory Visit (HOSPITAL_COMMUNITY): Payer: Self-pay

## 2023-11-19 ENCOUNTER — Telehealth: Payer: Self-pay | Admitting: Pharmacy Technician

## 2023-11-19 DIAGNOSIS — I480 Paroxysmal atrial fibrillation: Secondary | ICD-10-CM

## 2023-11-19 MED ORDER — APIXABAN 5 MG PO TABS
5.0000 mg | ORAL_TABLET | Freq: Two times a day (BID) | ORAL | 1 refills | Status: AC
  Filled 2023-11-19: qty 180, 90d supply, fill #0
  Filled 2024-02-04: qty 180, 90d supply, fill #1

## 2023-11-19 NOTE — Telephone Encounter (Signed)
 Prescription refill request for Eliquis  received. Indication: AFIB Last office visit: 09/10/2023 Scr: 1.23 Age:  89 Weight:' 63.8 KG

## 2023-11-19 NOTE — Telephone Encounter (Signed)
 Hi, the patient called and would like a refill for eliquis  sent to our [place] pharmacy please and thank you!

## 2023-11-22 ENCOUNTER — Other Ambulatory Visit (HOSPITAL_COMMUNITY): Payer: Self-pay

## 2023-11-25 ENCOUNTER — Other Ambulatory Visit: Payer: Self-pay

## 2023-11-25 ENCOUNTER — Other Ambulatory Visit: Payer: Self-pay | Admitting: Family Medicine

## 2023-11-25 ENCOUNTER — Other Ambulatory Visit (HOSPITAL_COMMUNITY): Payer: Self-pay

## 2023-11-25 DIAGNOSIS — G8929 Other chronic pain: Secondary | ICD-10-CM

## 2023-11-26 ENCOUNTER — Other Ambulatory Visit (HOSPITAL_COMMUNITY): Payer: Self-pay

## 2023-11-26 ENCOUNTER — Other Ambulatory Visit: Payer: Self-pay

## 2023-11-26 MED ORDER — TRAMADOL HCL 50 MG PO TABS
50.0000 mg | ORAL_TABLET | Freq: Three times a day (TID) | ORAL | 0 refills | Status: DC | PRN
  Filled 2023-11-26: qty 90, 30d supply, fill #0

## 2023-11-26 NOTE — Telephone Encounter (Signed)
 Requesting: Tramadol  50 MG Contract:  UDS: 03/08/2023 Last Visit: 09/06/2023 Next Visit: 03/09/2024 Last Refill: 10/27/2023  Please Advise

## 2023-11-30 ENCOUNTER — Other Ambulatory Visit: Payer: Self-pay | Admitting: Gastroenterology

## 2023-11-30 ENCOUNTER — Other Ambulatory Visit (HOSPITAL_COMMUNITY): Payer: Self-pay

## 2023-11-30 MED ORDER — METOCLOPRAMIDE HCL 5 MG PO TABS
5.0000 mg | ORAL_TABLET | Freq: Every day | ORAL | 3 refills | Status: AC
  Filled 2023-11-30 – 2023-12-18 (×3): qty 30, 30d supply, fill #0
  Filled 2024-01-12: qty 30, 30d supply, fill #1
  Filled 2024-02-10: qty 30, 30d supply, fill #2

## 2023-12-10 ENCOUNTER — Other Ambulatory Visit (HOSPITAL_COMMUNITY): Payer: Self-pay

## 2023-12-11 ENCOUNTER — Other Ambulatory Visit: Payer: Self-pay | Admitting: Family Medicine

## 2023-12-11 ENCOUNTER — Other Ambulatory Visit (HOSPITAL_COMMUNITY): Payer: Self-pay

## 2023-12-13 ENCOUNTER — Other Ambulatory Visit (HOSPITAL_COMMUNITY): Payer: Self-pay

## 2023-12-13 ENCOUNTER — Other Ambulatory Visit: Payer: Self-pay

## 2023-12-13 MED ORDER — OMEPRAZOLE 40 MG PO CPDR
40.0000 mg | DELAYED_RELEASE_CAPSULE | Freq: Every day | ORAL | 5 refills | Status: AC | PRN
  Filled 2023-12-13: qty 30, 30d supply, fill #0
  Filled 2024-01-31 – 2024-02-08 (×3): qty 30, 30d supply, fill #1

## 2023-12-18 ENCOUNTER — Other Ambulatory Visit (HOSPITAL_COMMUNITY): Payer: Self-pay

## 2023-12-23 ENCOUNTER — Other Ambulatory Visit: Payer: Self-pay | Admitting: Family Medicine

## 2023-12-23 ENCOUNTER — Other Ambulatory Visit (HOSPITAL_COMMUNITY): Payer: Self-pay

## 2023-12-23 DIAGNOSIS — G8929 Other chronic pain: Secondary | ICD-10-CM

## 2023-12-23 NOTE — Telephone Encounter (Signed)
 Requesting: tramadol  50mg   Contract:03/08/23 UDS: 03/08/23 Last Visit: 09/29/23 w/ [facility]ell Next Visit: 03/09/24  Last Refill: 11/26/23 #90 and 0RF   Please Advise

## 2023-12-24 ENCOUNTER — Other Ambulatory Visit: Payer: Self-pay

## 2023-12-24 ENCOUNTER — Other Ambulatory Visit (HOSPITAL_COMMUNITY): Payer: Self-pay

## 2023-12-24 MED ORDER — TRAMADOL HCL 50 MG PO TABS
50.0000 mg | ORAL_TABLET | Freq: Three times a day (TID) | ORAL | 0 refills | Status: DC | PRN
  Filled 2023-12-24: qty 90, 30d supply, fill #0

## 2023-12-31 ENCOUNTER — Other Ambulatory Visit (HOSPITAL_COMMUNITY): Payer: Self-pay

## 2024-01-07 ENCOUNTER — Other Ambulatory Visit (HOSPITAL_COMMUNITY): Payer: Self-pay

## 2024-01-07 ENCOUNTER — Other Ambulatory Visit: Payer: Self-pay

## 2024-01-10 ENCOUNTER — Other Ambulatory Visit (HOSPITAL_COMMUNITY): Payer: Self-pay

## 2024-01-12 ENCOUNTER — Other Ambulatory Visit (HOSPITAL_COMMUNITY): Payer: Self-pay

## 2024-01-20 ENCOUNTER — Other Ambulatory Visit (HOSPITAL_COMMUNITY): Payer: Self-pay

## 2024-01-20 ENCOUNTER — Other Ambulatory Visit: Payer: Self-pay | Admitting: Family Medicine

## 2024-01-20 DIAGNOSIS — G8929 Other chronic pain: Secondary | ICD-10-CM

## 2024-01-21 ENCOUNTER — Other Ambulatory Visit (HOSPITAL_COMMUNITY): Payer: Self-pay

## 2024-01-21 MED ORDER — TRAMADOL HCL 50 MG PO TABS
50.0000 mg | ORAL_TABLET | Freq: Three times a day (TID) | ORAL | 0 refills | Status: AC | PRN
  Filled 2024-01-21 – 2024-01-22 (×2): qty 90, 30d supply, fill #0

## 2024-01-21 NOTE — Telephone Encounter (Signed)
 Requesting: tramadol   Contract: 03/08/23 UDS:  03/08/23 Last Visit: 09/29/23 w/ Waddell  Next Visit: 03/09/24 Last Refill: 12/24/23 #90 and 0RF   Please Advise

## 2024-01-22 ENCOUNTER — Other Ambulatory Visit (HOSPITAL_COMMUNITY): Payer: Self-pay

## 2024-01-26 ENCOUNTER — Other Ambulatory Visit (HOSPITAL_COMMUNITY): Payer: Self-pay

## 2024-01-27 ENCOUNTER — Other Ambulatory Visit (HOSPITAL_COMMUNITY): Payer: Self-pay

## 2024-02-03 ENCOUNTER — Other Ambulatory Visit (HOSPITAL_COMMUNITY): Payer: Self-pay

## 2024-02-03 ENCOUNTER — Other Ambulatory Visit: Payer: Self-pay

## 2024-02-04 ENCOUNTER — Other Ambulatory Visit (HOSPITAL_COMMUNITY): Payer: Self-pay

## 2024-02-05 ENCOUNTER — Other Ambulatory Visit (HOSPITAL_COMMUNITY): Payer: Self-pay

## 2024-02-08 ENCOUNTER — Other Ambulatory Visit (HOSPITAL_COMMUNITY): Payer: Self-pay

## 2024-02-09 ENCOUNTER — Other Ambulatory Visit: Payer: Self-pay

## 2024-02-09 ENCOUNTER — Other Ambulatory Visit (HOSPITAL_COMMUNITY): Payer: Self-pay

## 2024-02-10 ENCOUNTER — Other Ambulatory Visit (HOSPITAL_COMMUNITY): Payer: Self-pay

## 2024-02-11 ENCOUNTER — Other Ambulatory Visit: Payer: Self-pay | Admitting: Internal Medicine

## 2024-02-11 ENCOUNTER — Other Ambulatory Visit (HOSPITAL_COMMUNITY): Payer: Self-pay

## 2024-02-11 DIAGNOSIS — I5032 Chronic diastolic (congestive) heart failure: Secondary | ICD-10-CM

## 2024-02-11 MED ORDER — FUROSEMIDE 20 MG PO TABS
20.0000 mg | ORAL_TABLET | ORAL | 3 refills | Status: AC
  Filled 2024-02-11: qty 10, 35d supply, fill #0

## 2024-03-09 ENCOUNTER — Ambulatory Visit: Admitting: Family Medicine

## 2024-06-30 ENCOUNTER — Ambulatory Visit
